# Patient Record
Sex: Male | Born: 1935 | Race: White | Hispanic: No | Marital: Married | Smoking: Never smoker
Health system: Southern US, Community
[De-identification: ages and names within clinical notes are randomized; demographics above are authoritative.]

## PROBLEM LIST (undated history)

## (undated) DIAGNOSIS — Z8551 Personal history of malignant neoplasm of bladder: Secondary | ICD-10-CM

## (undated) DIAGNOSIS — Z7901 Long term (current) use of anticoagulants: Secondary | ICD-10-CM

## (undated) DIAGNOSIS — E785 Hyperlipidemia, unspecified: Secondary | ICD-10-CM

## (undated) DIAGNOSIS — Z951 Presence of aortocoronary bypass graft: Secondary | ICD-10-CM

## (undated) DIAGNOSIS — I639 Cerebral infarction, unspecified: Secondary | ICD-10-CM

## (undated) DIAGNOSIS — Z953 Presence of xenogenic heart valve: Secondary | ICD-10-CM

## (undated) DIAGNOSIS — Z9889 Other specified postprocedural states: Secondary | ICD-10-CM

## (undated) DIAGNOSIS — R351 Nocturia: Secondary | ICD-10-CM

## (undated) DIAGNOSIS — Z86711 Personal history of pulmonary embolism: Secondary | ICD-10-CM

## (undated) DIAGNOSIS — R3915 Urgency of urination: Secondary | ICD-10-CM

## (undated) DIAGNOSIS — Z972 Presence of dental prosthetic device (complete) (partial): Secondary | ICD-10-CM

## (undated) DIAGNOSIS — Z8546 Personal history of malignant neoplasm of prostate: Secondary | ICD-10-CM

## (undated) DIAGNOSIS — D696 Thrombocytopenia, unspecified: Secondary | ICD-10-CM

## (undated) DIAGNOSIS — Z85828 Personal history of other malignant neoplasm of skin: Secondary | ICD-10-CM

## (undated) DIAGNOSIS — I2510 Atherosclerotic heart disease of native coronary artery without angina pectoris: Secondary | ICD-10-CM

## (undated) DIAGNOSIS — I4892 Unspecified atrial flutter: Secondary | ICD-10-CM

## (undated) DIAGNOSIS — D494 Neoplasm of unspecified behavior of bladder: Secondary | ICD-10-CM

## (undated) DIAGNOSIS — Z974 Presence of external hearing-aid: Secondary | ICD-10-CM

## (undated) DIAGNOSIS — Z952 Presence of prosthetic heart valve: Secondary | ICD-10-CM

## (undated) DIAGNOSIS — Z86718 Personal history of other venous thrombosis and embolism: Secondary | ICD-10-CM

## (undated) DIAGNOSIS — R600 Localized edema: Secondary | ICD-10-CM

## (undated) DIAGNOSIS — R350 Frequency of micturition: Secondary | ICD-10-CM

## (undated) DIAGNOSIS — E049 Nontoxic goiter, unspecified: Secondary | ICD-10-CM

## (undated) DIAGNOSIS — Z85820 Personal history of malignant melanoma of skin: Secondary | ICD-10-CM

## (undated) HISTORY — PX: CARDIAC CATHETERIZATION: SHX172

## (undated) HISTORY — PX: CORONARY ARTERY BYPASS GRAFT: SHX141

## (undated) HISTORY — DX: Hyperlipidemia, unspecified: E78.5

## (undated) HISTORY — PX: AORTIC VALVE REPLACEMENT (AVR)/CORONARY ARTERY BYPASS GRAFTING (CABG): SHX5725

## (undated) HISTORY — DX: Personal history of other venous thrombosis and embolism: Z86.718

## (undated) HISTORY — DX: Cerebral infarction, unspecified: I63.9

## (undated) HISTORY — PX: PROSTATECTOMY: SHX69

## (undated) HISTORY — PX: OTHER SURGICAL HISTORY: SHX169

## (undated) HISTORY — DX: Other specified postprocedural states: Z98.890

## (undated) HISTORY — DX: Atherosclerotic heart disease of native coronary artery without angina pectoris: I25.10

## (undated) HISTORY — DX: Long term (current) use of anticoagulants: Z79.01

## (undated) HISTORY — DX: Thrombocytopenia, unspecified: D69.6

## (undated) HISTORY — PX: CATARACT EXTRACTION W/ INTRAOCULAR LENS  IMPLANT, BILATERAL: SHX1307

## (undated) HISTORY — PX: CARDIOVASCULAR STRESS TEST: SHX262

## (undated) HISTORY — PX: TRANSTHORACIC ECHOCARDIOGRAM: SHX275

---
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
## 1997-12-15 ENCOUNTER — Other Ambulatory Visit: Admission: RE | Admit: 1997-12-15 | Discharge: 1997-12-15 | Payer: Self-pay | Admitting: Urology

## 1998-05-10 ENCOUNTER — Encounter: Admission: RE | Admit: 1998-05-10 | Discharge: 1998-08-08 | Payer: Self-pay | Admitting: Radiation Oncology

## 1998-09-07 ENCOUNTER — Ambulatory Visit (HOSPITAL_BASED_OUTPATIENT_CLINIC_OR_DEPARTMENT_OTHER): Admission: RE | Admit: 1998-09-07 | Discharge: 1998-09-07 | Payer: Self-pay | Admitting: Orthopedic Surgery

## 1998-09-19 HISTORY — PX: ROTATOR CUFF REPAIR: SHX139

## 1998-11-11 ENCOUNTER — Encounter: Payer: Self-pay | Admitting: Orthopedic Surgery

## 1998-11-11 ENCOUNTER — Inpatient Hospital Stay (HOSPITAL_COMMUNITY): Admission: AD | Admit: 1998-11-11 | Discharge: 1998-11-15 | Payer: Self-pay | Admitting: Internal Medicine

## 1998-11-15 ENCOUNTER — Ambulatory Visit (HOSPITAL_COMMUNITY): Admission: RE | Admit: 1998-11-15 | Discharge: 1998-11-15 | Payer: Self-pay | Admitting: Internal Medicine

## 2000-01-18 ENCOUNTER — Encounter: Payer: Self-pay | Admitting: Emergency Medicine

## 2000-01-18 ENCOUNTER — Emergency Department (HOSPITAL_COMMUNITY): Admission: EM | Admit: 2000-01-18 | Discharge: 2000-01-19 | Payer: Self-pay | Admitting: Emergency Medicine

## 2000-01-18 ENCOUNTER — Encounter: Payer: Self-pay | Admitting: Urology

## 2000-01-19 ENCOUNTER — Encounter: Payer: Self-pay | Admitting: Urology

## 2002-12-28 ENCOUNTER — Encounter: Admission: RE | Admit: 2002-12-28 | Discharge: 2002-12-28 | Payer: Self-pay | Admitting: Internal Medicine

## 2002-12-28 ENCOUNTER — Encounter: Payer: Self-pay | Admitting: Internal Medicine

## 2003-05-29 ENCOUNTER — Inpatient Hospital Stay (HOSPITAL_COMMUNITY): Admission: EM | Admit: 2003-05-29 | Discharge: 2003-06-03 | Payer: Self-pay | Admitting: Emergency Medicine

## 2003-05-29 IMAGING — CT CT EXTREM LOW BILAT W/ CM
1 of 2 series · 8 of 14 positions shown, 10 images · IV contrast (150 ML OMNI 300)
Comparison: none

CLINICAL DATA: Chest pain, shortness of breath.
 CT CHEST WITH CONTRAST, CT LOWER EXTREMITIES BILATERAL LIMITED, [DATE]
 Multidetector helical CT images performed through the chest following 150 cc Omnipaque 300 IV per pulmonary embolus protocol.  Noncontiguous images were then performed through the lower extremities to assess for DVT.
 CT CHEST 
 There are extensive bilateral pulmonary emboli, most pronounced in the posterobasal right lower lobe branches, left upper lobe branches, and left lower lobe branches.  Dependent atelectasis and mild bibasilar atelectasis noted.  No effusions.  Heart is within limits of normal in size.  No mediastinal, hilar, or axillary adenopathy.
 IMPRESSION 
 Bilateral pulmonary emboli.  
 CT LOWER EXTREMITIES LIMITED, BILATERAL 
 No filling defects are seen in the deep venous structures of the pelvis or lower extremities to suggest DVT.  Limited images through the pelvis unremarkable.
 No evidence of lower extremity DVT.
 [REDACTED]

[Series 2: *don't forget:recon (date) offon · axial · 0.72mm/px · z∈[-511,-84]mm · 8 of 187 slices shown, 10 images]
[im 21/187  soft-tissue]
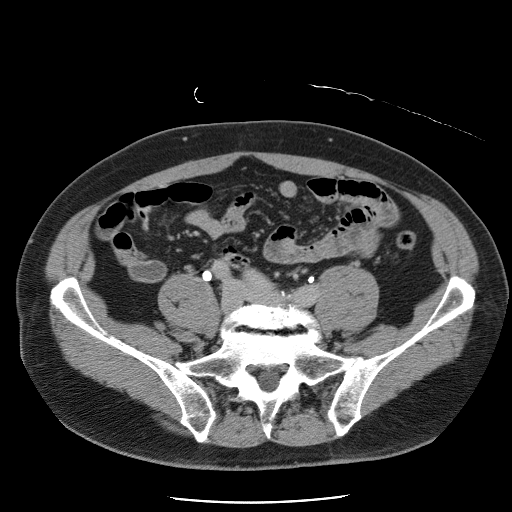
[im 21/187  bone]
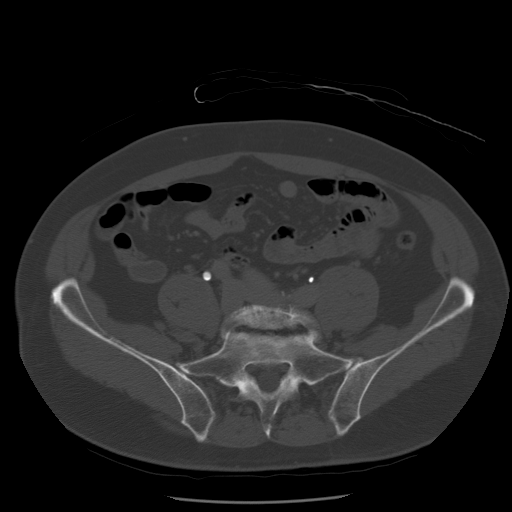
[im 42/187  bone]
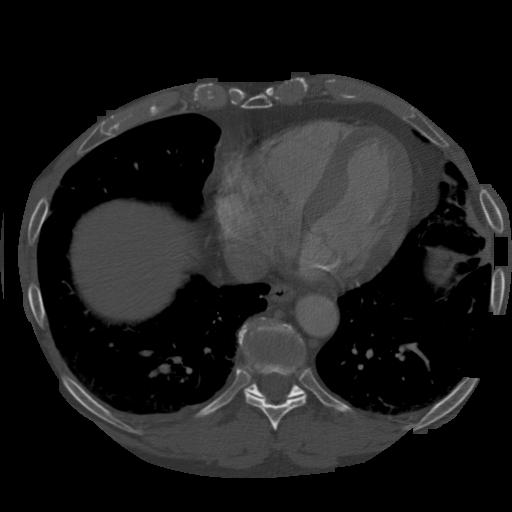
[im 63/187  bone]
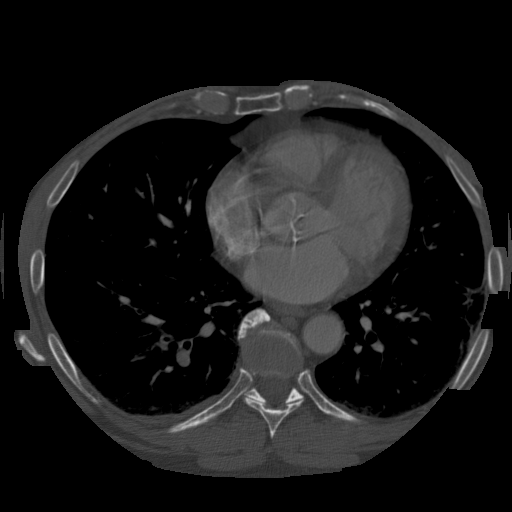
[im 83/187  bone]
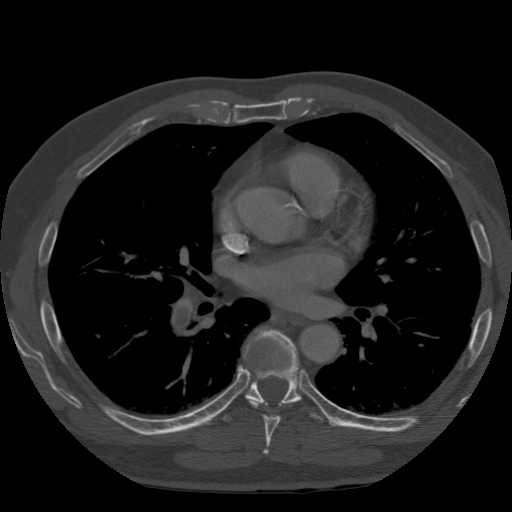
[im 104/187  soft-tissue]
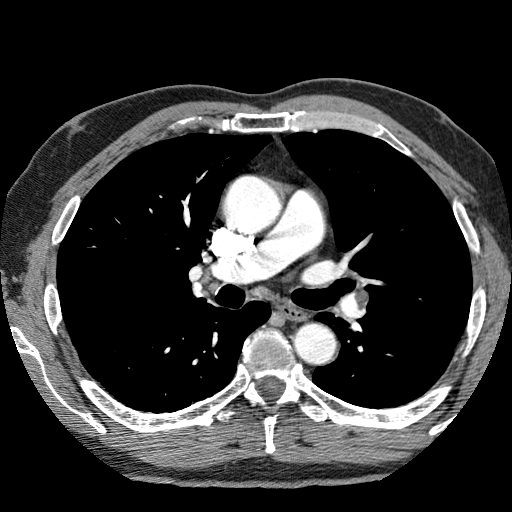
[im 104/187  bone]
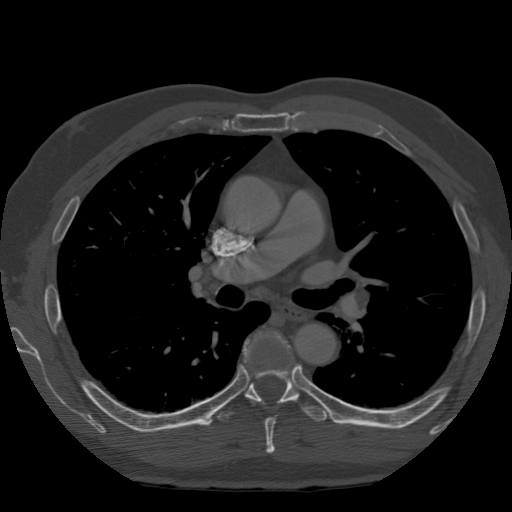
[im 125/187  bone]
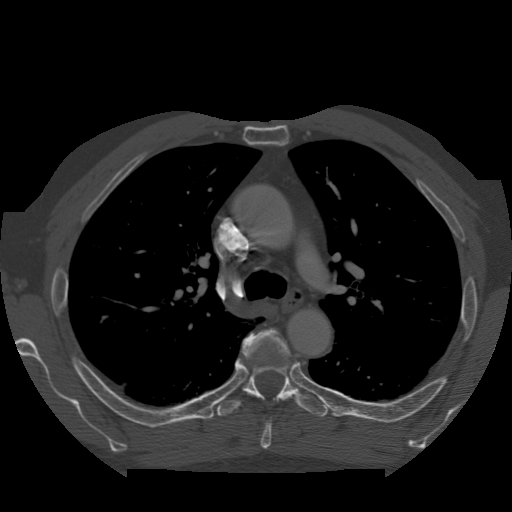
[im 145/187  bone]
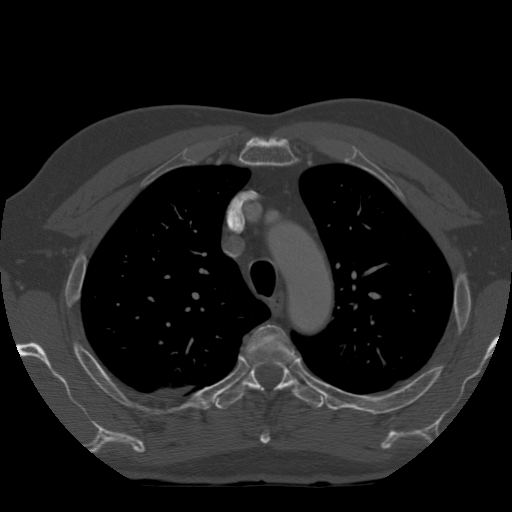
[im 166/187  bone]
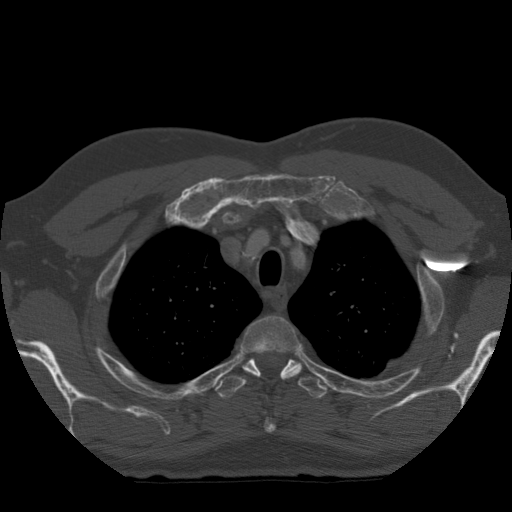

[8 of 14 positions shown; findings below may reference images not displayed]

## 2006-11-11 ENCOUNTER — Encounter: Admission: RE | Admit: 2006-11-11 | Discharge: 2006-11-11 | Payer: Self-pay | Admitting: Internal Medicine

## 2006-11-11 IMAGING — CT CT ANGIO CHEST
2 of 5 series · 19 of 36 positions shown · IV contrast ([ID] OMNI 300)
Comparison: [DATE]

CT ANGIOGRAPHY OF CHEST - PULMONARY EMBOLISM PROTOCOL:

Addendum Begins
I discussed the results of this exam with the patient and his wife immediately
after the exam. Mr. MIMS stated that he had been given a prescription for
pneumonia seen on an office chest x-ray earlier today. He is planning to have
the prescription filled this evening.
Addendum Ends
CLINICAL DATA: Increasing shortness of breath with cough. Left chest pain.
History of pulmonary embolus
TECHNIQUE: Multidetector CT imaging of the chest was performed according to the
protocol for detection of pulmonary embolism during bolus injection of
intravenous contrast.  Coronal and sagittal plane CT angiographic image
reconstructions were also generated.

Contrast:  100 cc Omnipaque 300

[Series 4: pe · axial · 0.64mm/px · z∈[-384,-59]mm · 18 of 142 slices shown]
[im 6/142  lung]
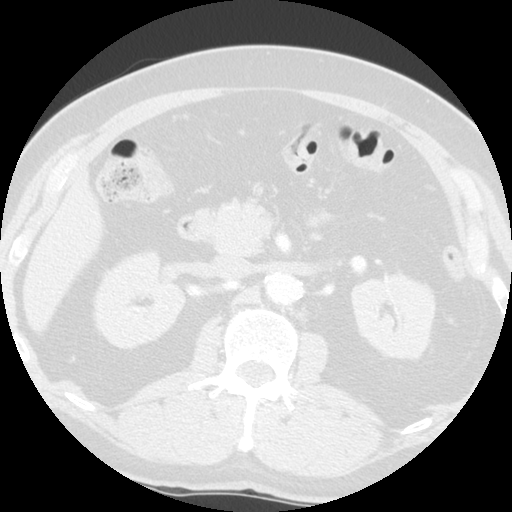
[im 16/142  mediastinal]
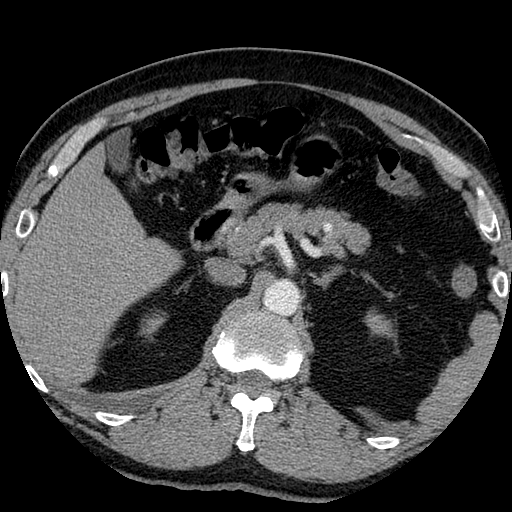
[im 21/142  lung]
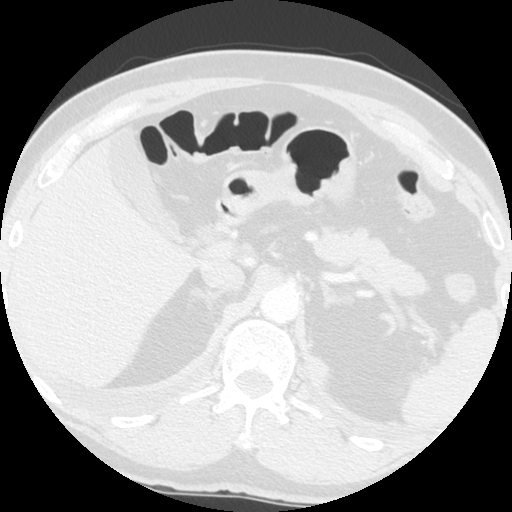
[im 32/142  mediastinal]
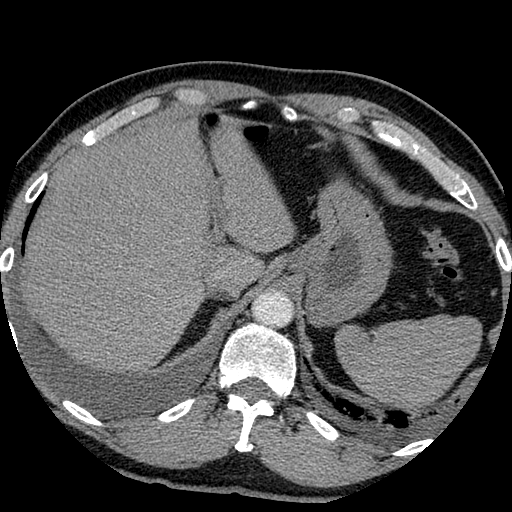
[im 37/142  lung]
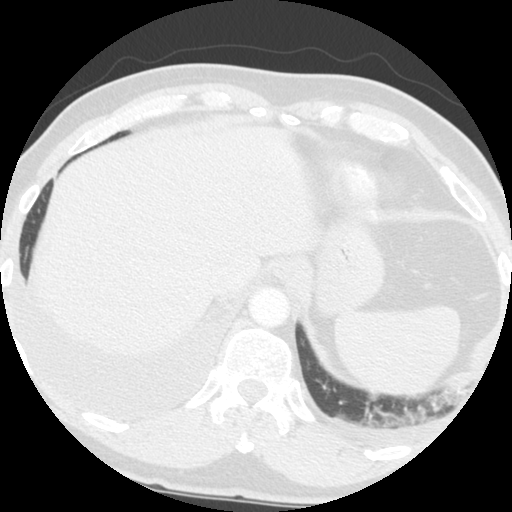
[im 42/142  mediastinal]
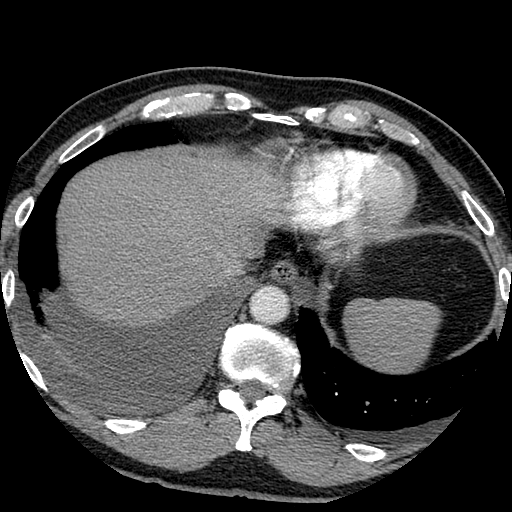
[im 53/142  lung]
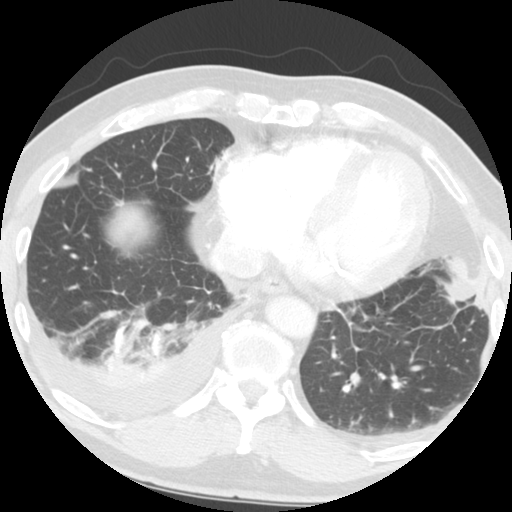
[im 58/142  mediastinal]
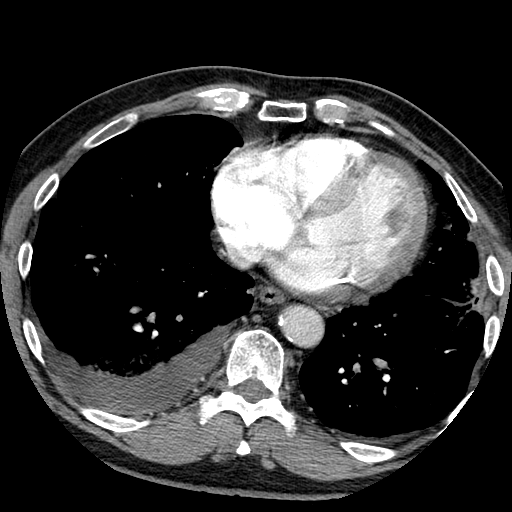
[im 68/142  lung]
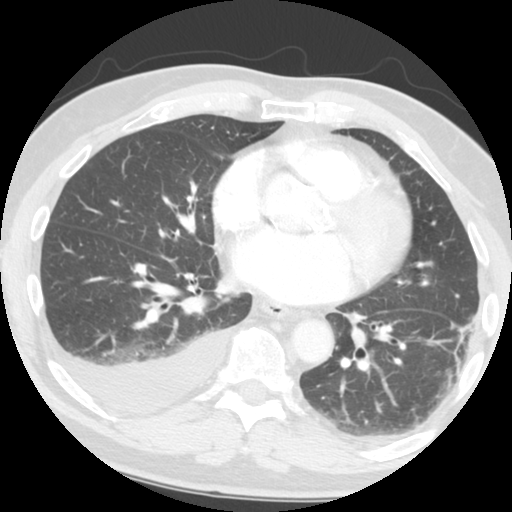
[im 74/142  mediastinal]
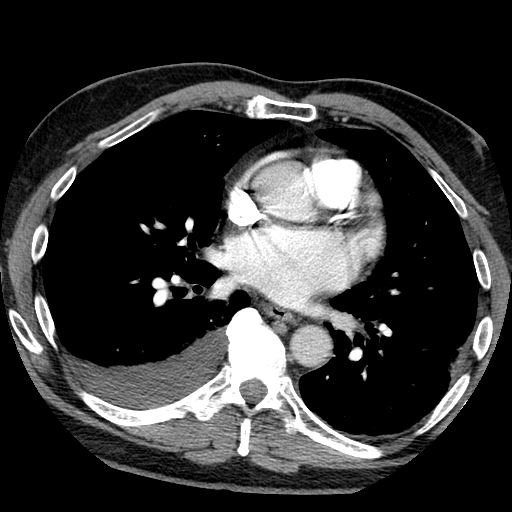
[im 84/142  lung]
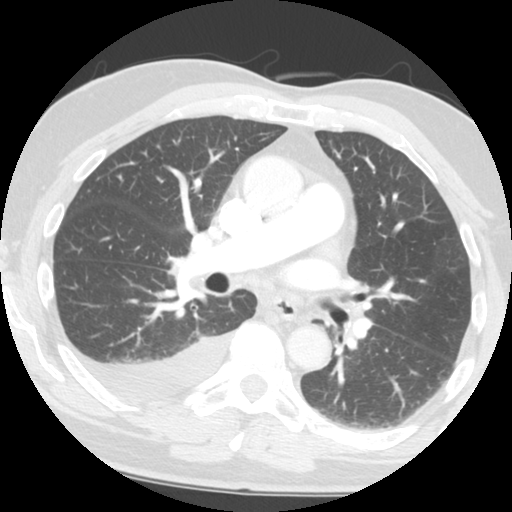
[im 89/142  mediastinal]
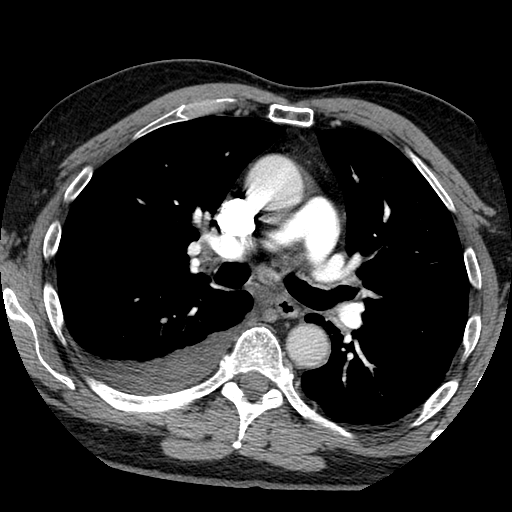
[im 100/142  lung]
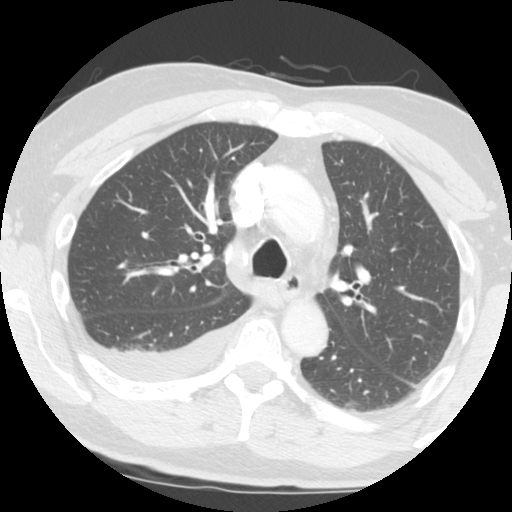
[im 105/142  mediastinal]
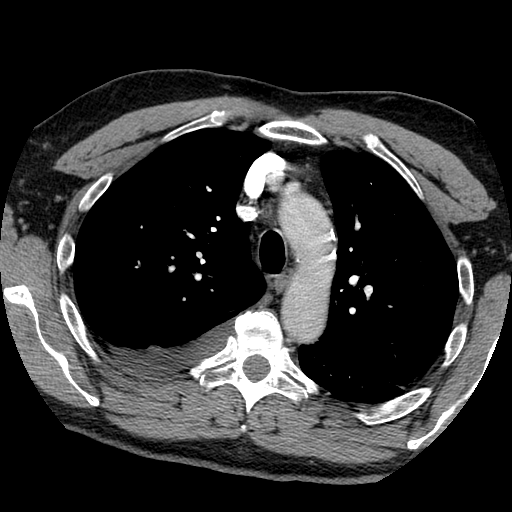
[im 110/142  lung]
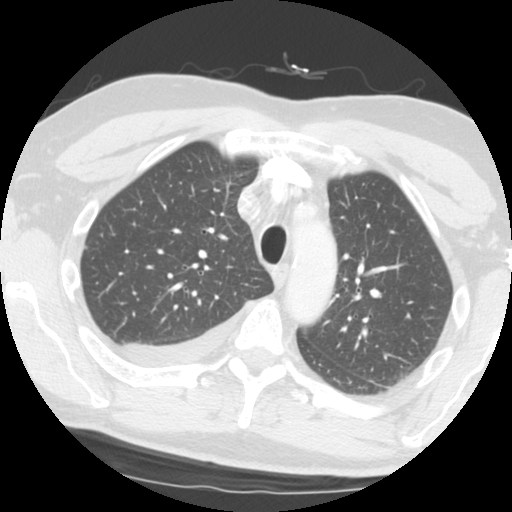
[im 121/142  mediastinal]
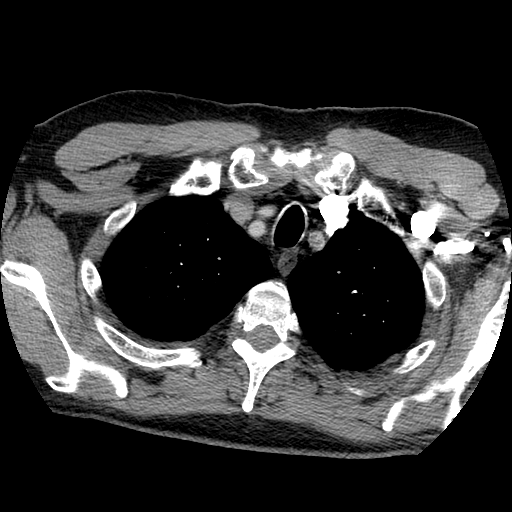
[im 126/142  lung]
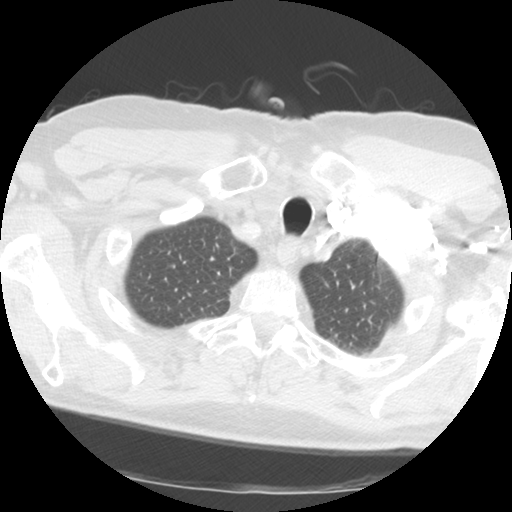
[im 136/142  mediastinal]
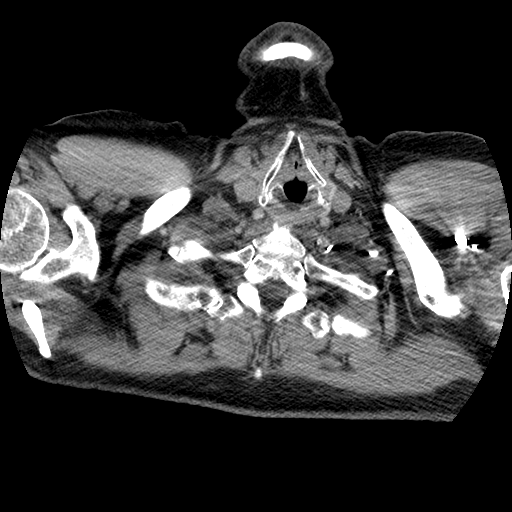

[Series 602: sagittal angio · sagittal · 0.69mm/px · 1 of 133 slices shown]
[im 67/133  mediastinal]
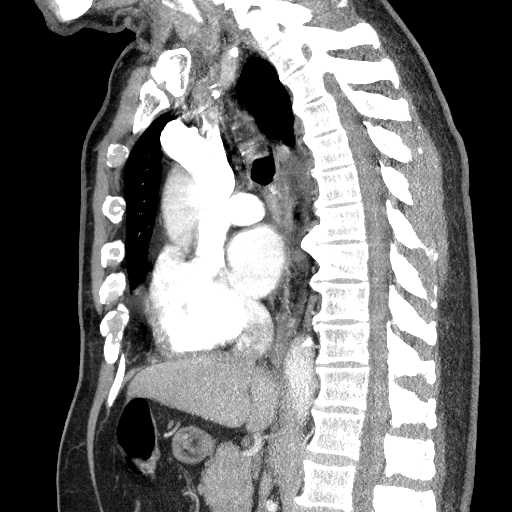

[19 of 36 positions shown; findings below may reference images not displayed]

FINDINGS: An there are no filling defects in the opacified pulmonary arteries to
suggest the presence of an acute or chronic pulmonary embolus. The pulmonary
embolic disease seen on the previous exam has resolved.

There is no axillary or mediastinal lymphadenopathy. A subcarinal lymph node and
lymphoid tissue in the right hilum is stable. Heart size is normal without
pericardial effusion. Aortic valve, mitral valve, and coronary artery
calcification is evident. There are small bilateral pleural effusions, right
slightly more than left.

Mild collapse/consolidation is seen in the posterior right lower lobe. Tiny
subpleural nodule on the right (image 46) is stable in the interval. A small
focus of soft tissue attenuation in the lingula, involving the base of the major
fissure, is stable in the interval, suggesting scarring. Dependent atelectasis
is seen in the posterior left lung base.
IMPRESSION: No CT evidence for acute pulmonary embolus.

Small bilateral pleural effusions.

Small focus of collapse / consolidation in the posterolateral lobe.

## 2007-05-12 ENCOUNTER — Ambulatory Visit (HOSPITAL_COMMUNITY): Admission: RE | Admit: 2007-05-12 | Discharge: 2007-05-12 | Payer: Self-pay | Admitting: Cardiology

## 2007-05-29 ENCOUNTER — Encounter (INDEPENDENT_AMBULATORY_CARE_PROVIDER_SITE_OTHER): Payer: Self-pay | Admitting: Cardiology

## 2007-05-29 ENCOUNTER — Ambulatory Visit (HOSPITAL_COMMUNITY): Admission: RE | Admit: 2007-05-29 | Discharge: 2007-05-29 | Payer: Self-pay | Admitting: Cardiology

## 2007-06-12 ENCOUNTER — Ambulatory Visit: Payer: Self-pay | Admitting: Surgery

## 2007-06-17 ENCOUNTER — Ambulatory Visit (HOSPITAL_COMMUNITY): Admission: RE | Admit: 2007-06-17 | Discharge: 2007-06-17 | Payer: Self-pay | Admitting: Surgery

## 2007-06-17 ENCOUNTER — Encounter: Payer: Self-pay | Admitting: Surgery

## 2007-06-17 IMAGING — CR DG CHEST 2V
2 series · 2 of 2 positions shown · non-contrast
Comparison: CTA chest [DATE].

CLINICAL DATA: 77-year-old male preop study for aortic and mitral valve replacement.  
 CHEST - 2 VIEW:

[view not recorded (1 of 2)]
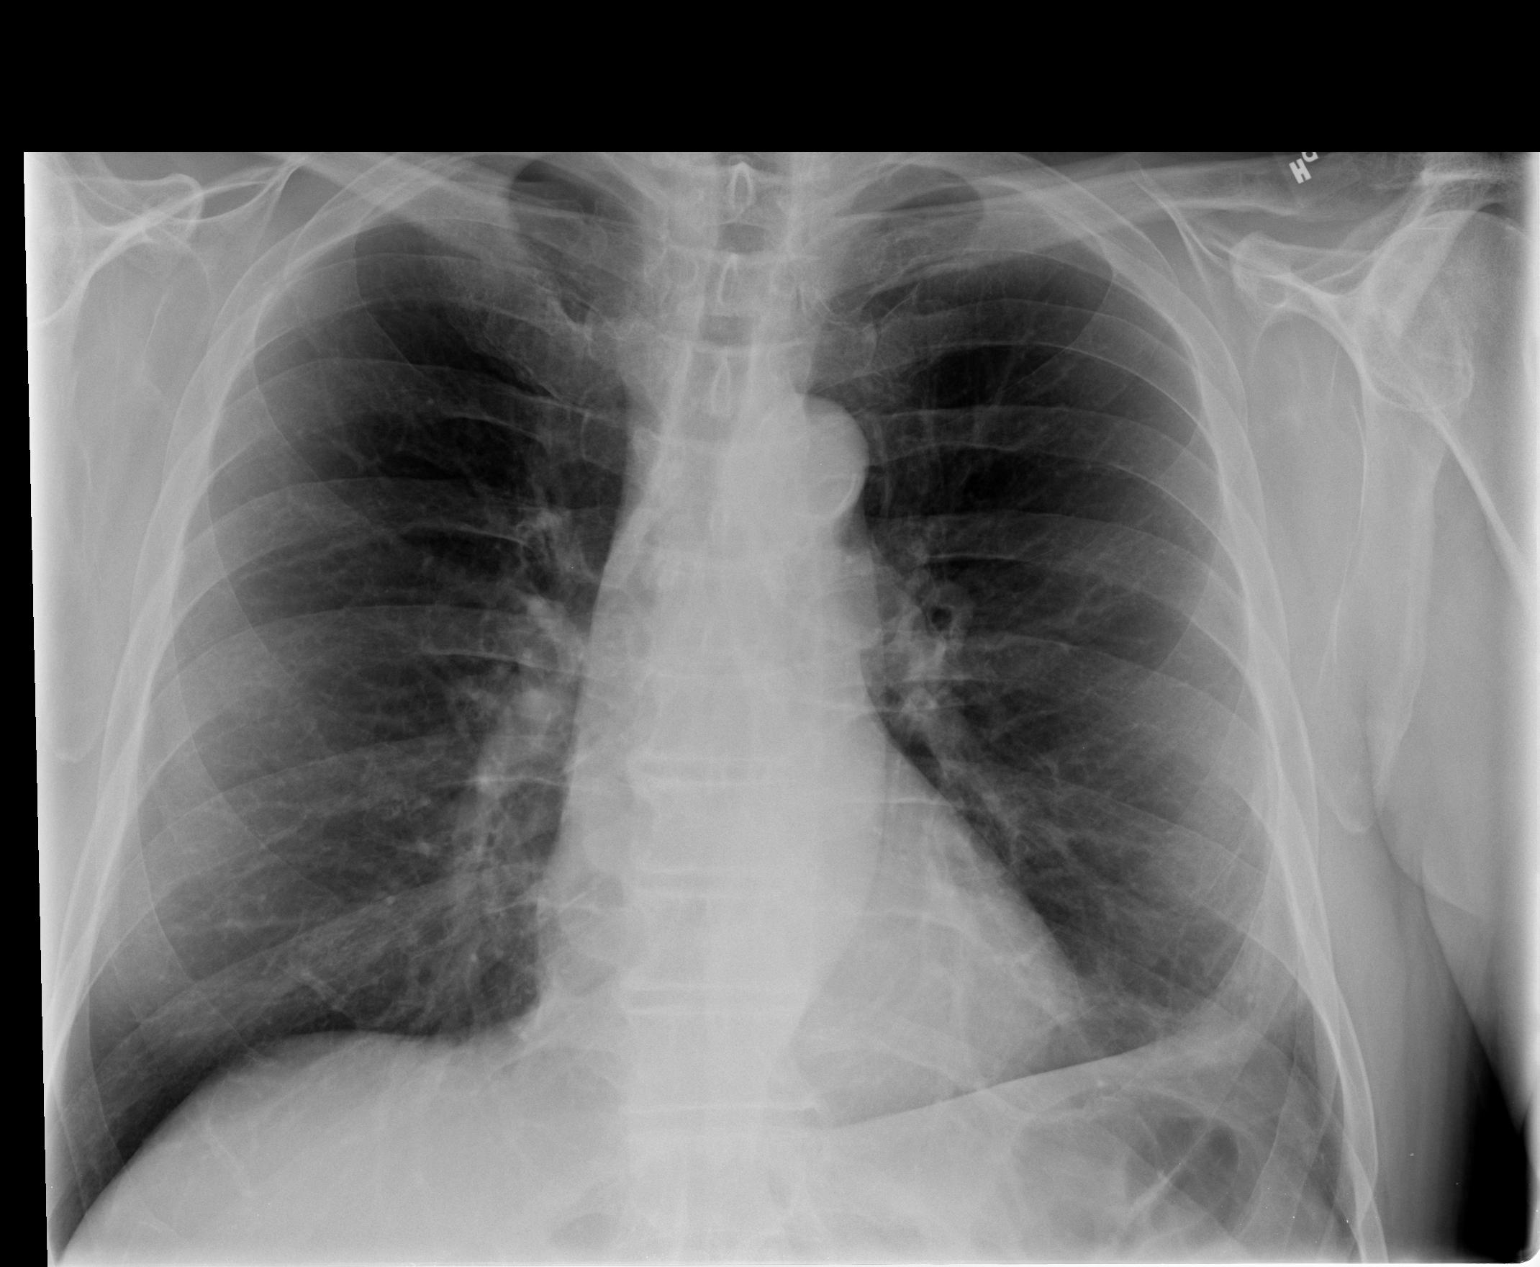

[view not recorded (2 of 2)]
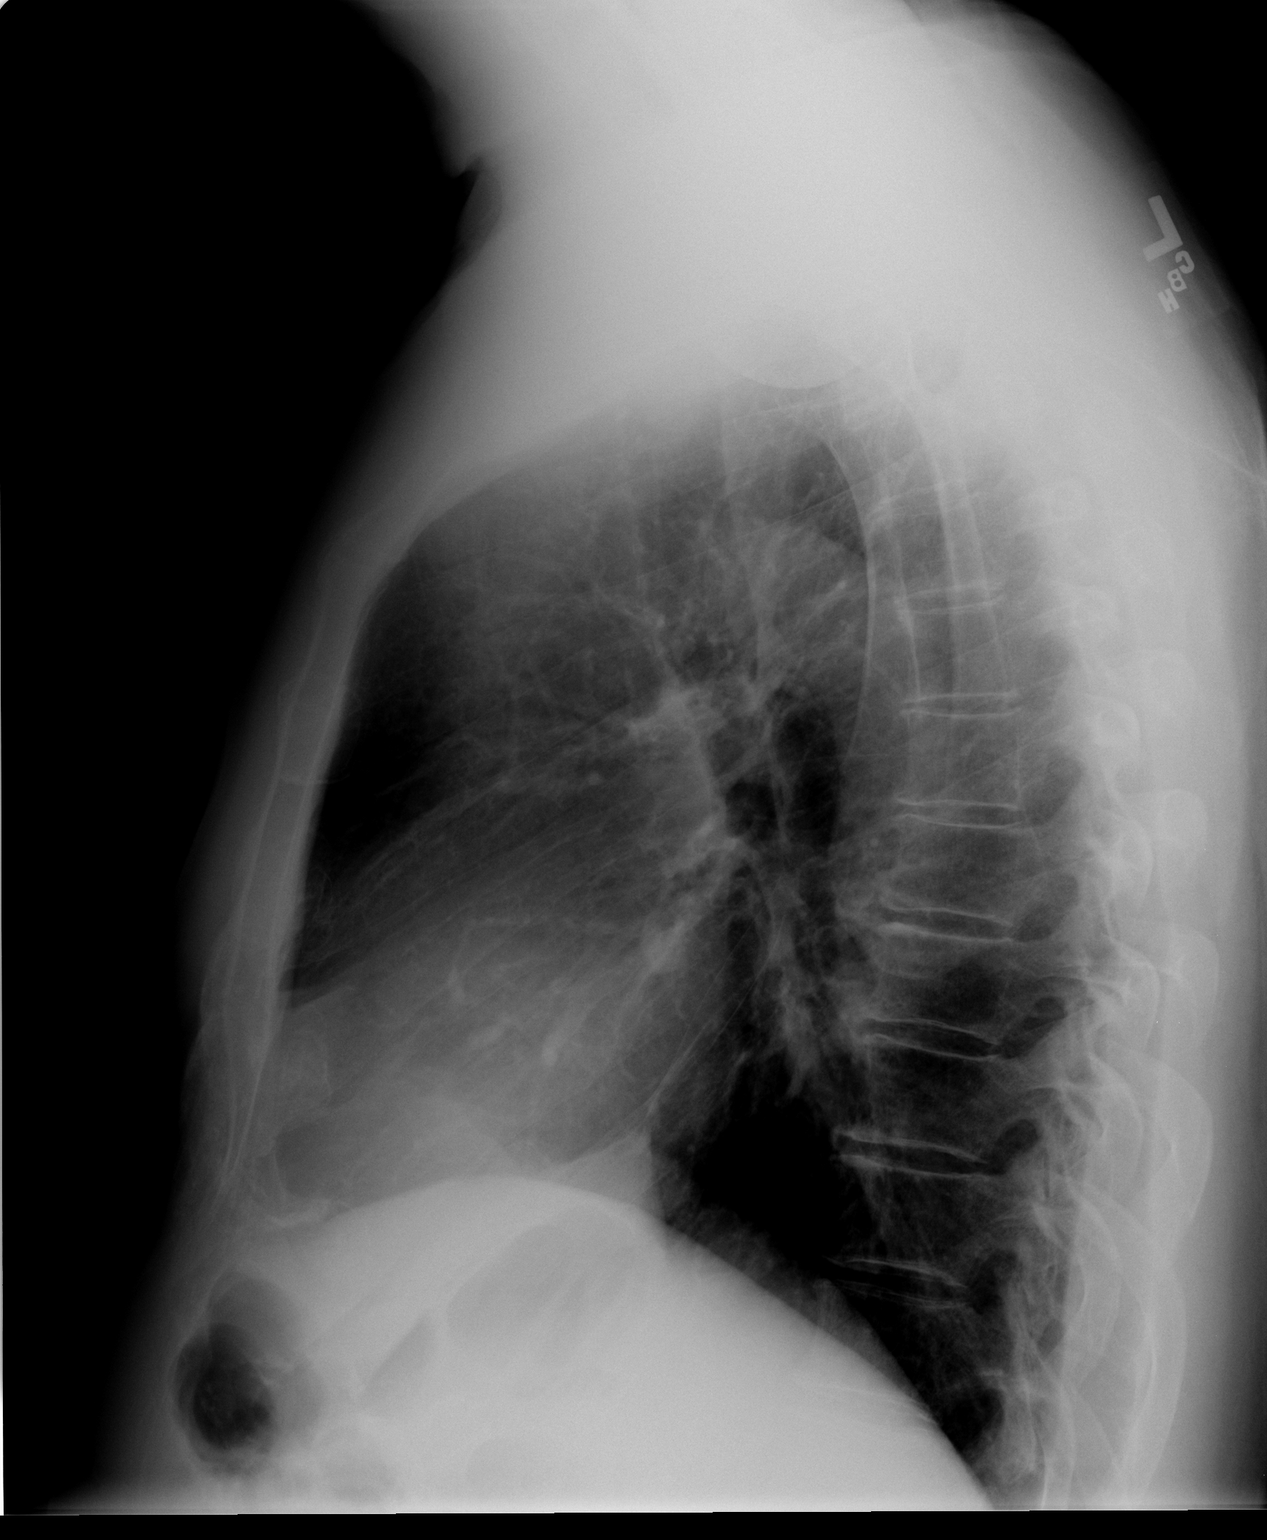

[2 of 2 positions shown; findings below may reference images not displayed]

FINDINGS: Chronic left lateral pleural scarring is noted with blunting of the left lateral costophrenic angle, unchanged from prior CT.  The posterior costophrenic angles are not entirely included.  There is no pleural effusion identified.  Cardiac size and mediastinal contour are normal aside from tortuous descending thoracic aorta.  No pneumothorax.  No pulmonary edema or consolidation.  No acute osseous abnormality is seen.
IMPRESSION: No acute cardiopulmonary abnormality.  Chronic left lateral costophrenic angle scarring.

## 2007-06-19 ENCOUNTER — Inpatient Hospital Stay (HOSPITAL_COMMUNITY): Admission: RE | Admit: 2007-06-19 | Discharge: 2007-06-24 | Payer: Self-pay | Admitting: Surgery

## 2007-06-19 ENCOUNTER — Ambulatory Visit: Payer: Self-pay | Admitting: Surgery

## 2007-06-19 ENCOUNTER — Encounter: Payer: Self-pay | Admitting: Surgery

## 2007-06-19 DIAGNOSIS — Z9889 Other specified postprocedural states: Secondary | ICD-10-CM

## 2007-06-19 DIAGNOSIS — I2510 Atherosclerotic heart disease of native coronary artery without angina pectoris: Secondary | ICD-10-CM

## 2007-06-19 HISTORY — DX: Other specified postprocedural states: Z98.890

## 2007-06-19 HISTORY — DX: Atherosclerotic heart disease of native coronary artery without angina pectoris: I25.10

## 2007-06-19 IMAGING — CR DG CHEST 1V PORT
1 series · 1 of 1 positions shown · non-contrast
Comparison: [DATE]

CLINICAL DATA: Status post median sternotomy.

CHEST - 1 VIEW

[AP]
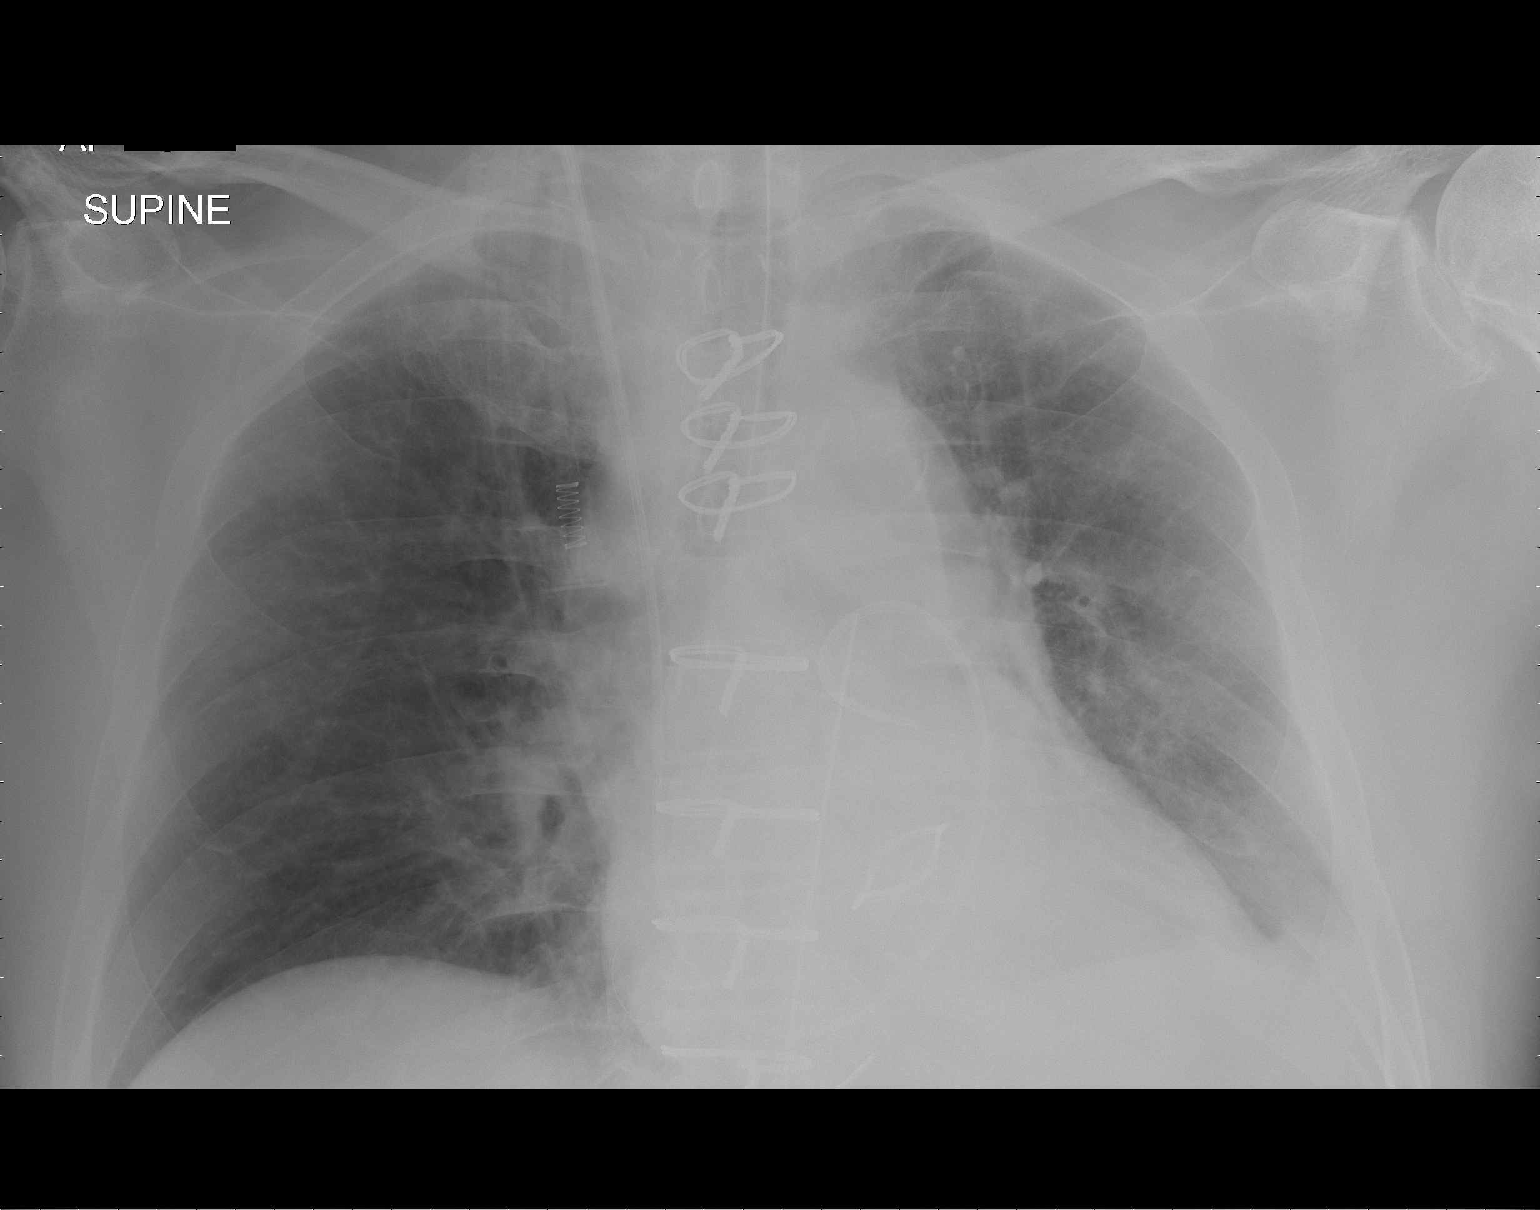

[1 of 1 positions shown; findings below may reference images not displayed]

FINDINGS: Endotracheal tube borderline low in position. Approximately 1.5 cm
above the carina. This could be retracted 2-3 cm for optimal positioning.

Median sternotomy. A right-sided IJ Swan-Ganz catheter is entering the left
pulmonary artery. Prior mitral valve repair. Left-sided chest tube and probable
mediastinal drains. No pneumothorax. Midline trachea. Mild superior mediastinal
soft tissue fullness is likely due to technique. Mild cardiomegaly. Small
left-sided pleural effusion. Mild left base atelectasis.

IMPRESSION

1. Endotracheal tube borderline low in position, 1.5 cm above carina. This
should be retracted 2-3 cm for optimal positioning.
2. Low lung volumes with small left pleural effusion and left base atelectasis.
3. No pneumothorax.

## 2007-06-20 IMAGING — CR DG CHEST 1V PORT
1 series · 1 of 1 positions shown · non-contrast
Comparison: [DATE].

CLINICAL DATA: Status post CABG.
 PORTABLE CHEST - 1 VIEW:

[view not recorded]
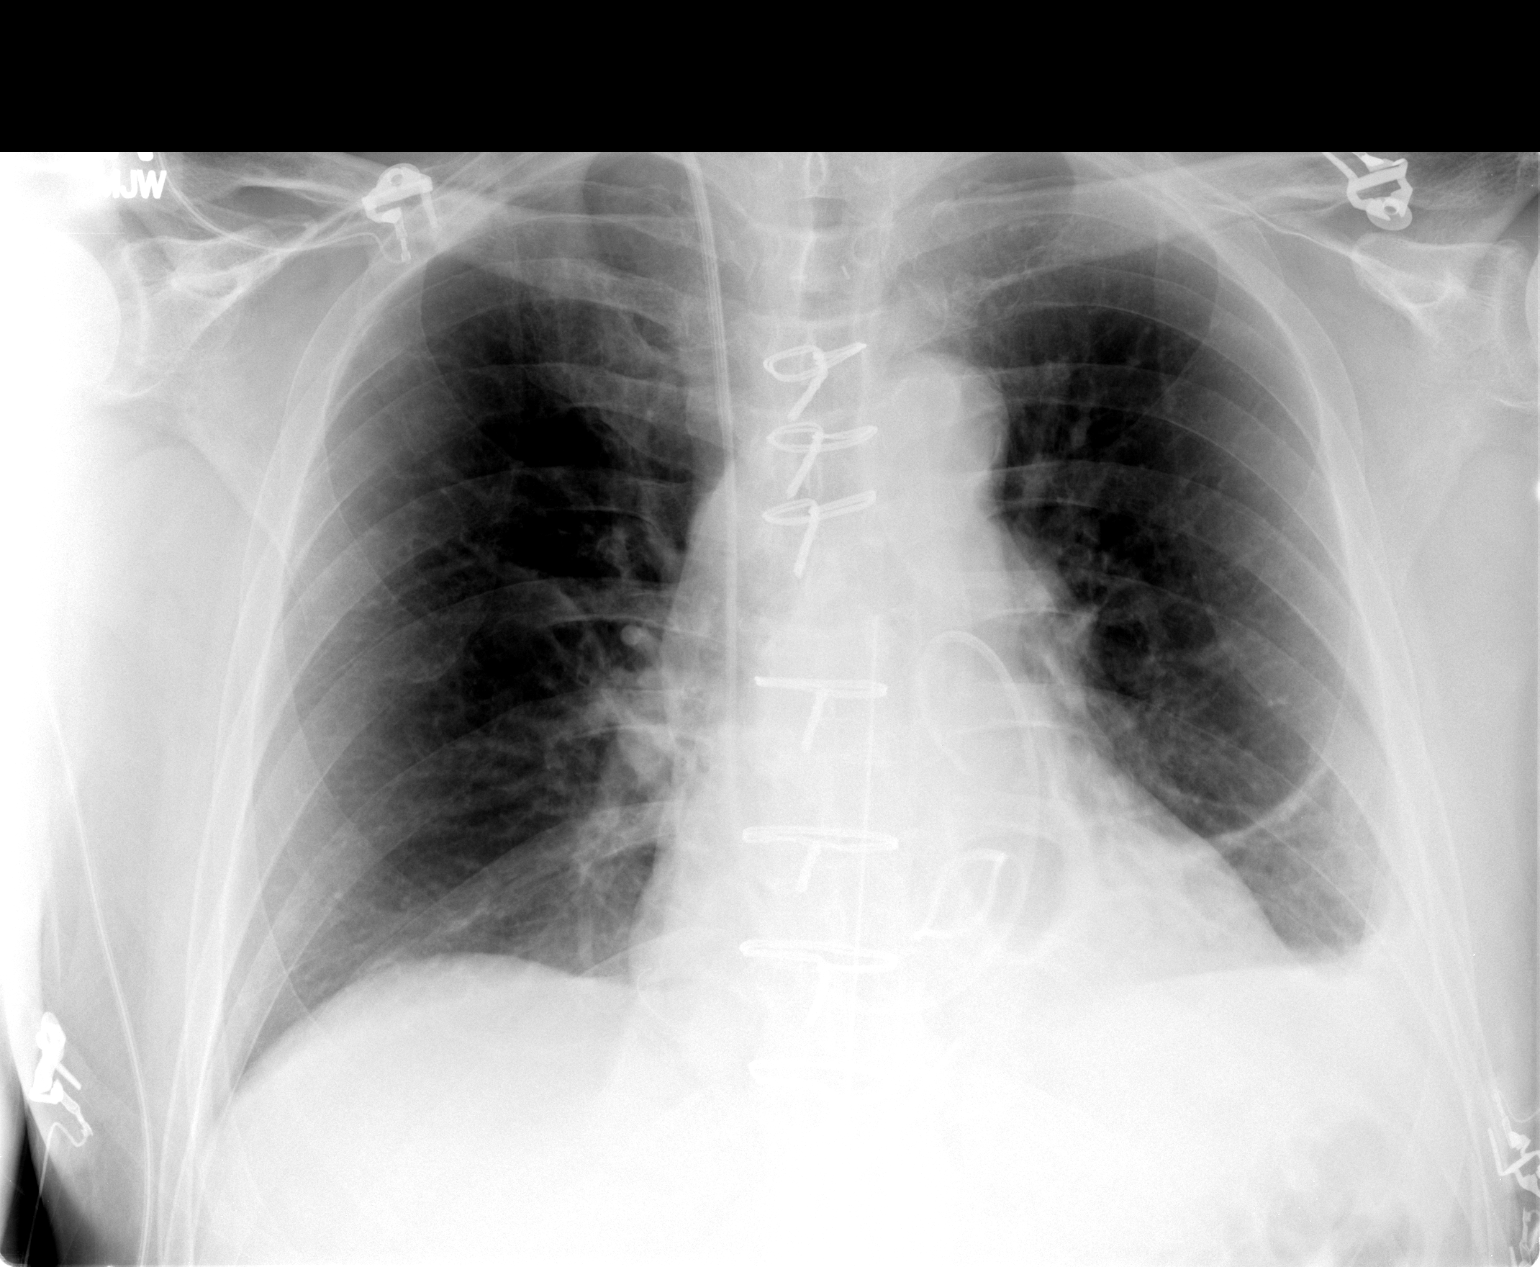

[1 of 1 positions shown; findings below may reference images not displayed]

FINDINGS: Endotracheal tube has been removed. Swan-Ganz catheter remains in place and is looped in the pulmonary outflow tract with the tip projecting inferiorly.  There is no pneumothorax. Small bilateral pleural effusions, left greater than right with basilar atelectasis.
IMPRESSION: 1.  Swan-Ganz catheter looped in the pulmonary outflow tract with the tip projecting inferiorly.
 2.  Small pleural effusions and basilar atelectasis, left greater than right.

## 2007-06-21 IMAGING — CR DG CHEST 1V PORT
1 series · 1 of 1 positions shown · non-contrast
Comparison: [DATE] and [DATE]

CLINICAL DATA: 77 year-old male with coronary artery disease status post median sternotomy.
PORTABLE CHEST- 1 VIEW:

[view not recorded]
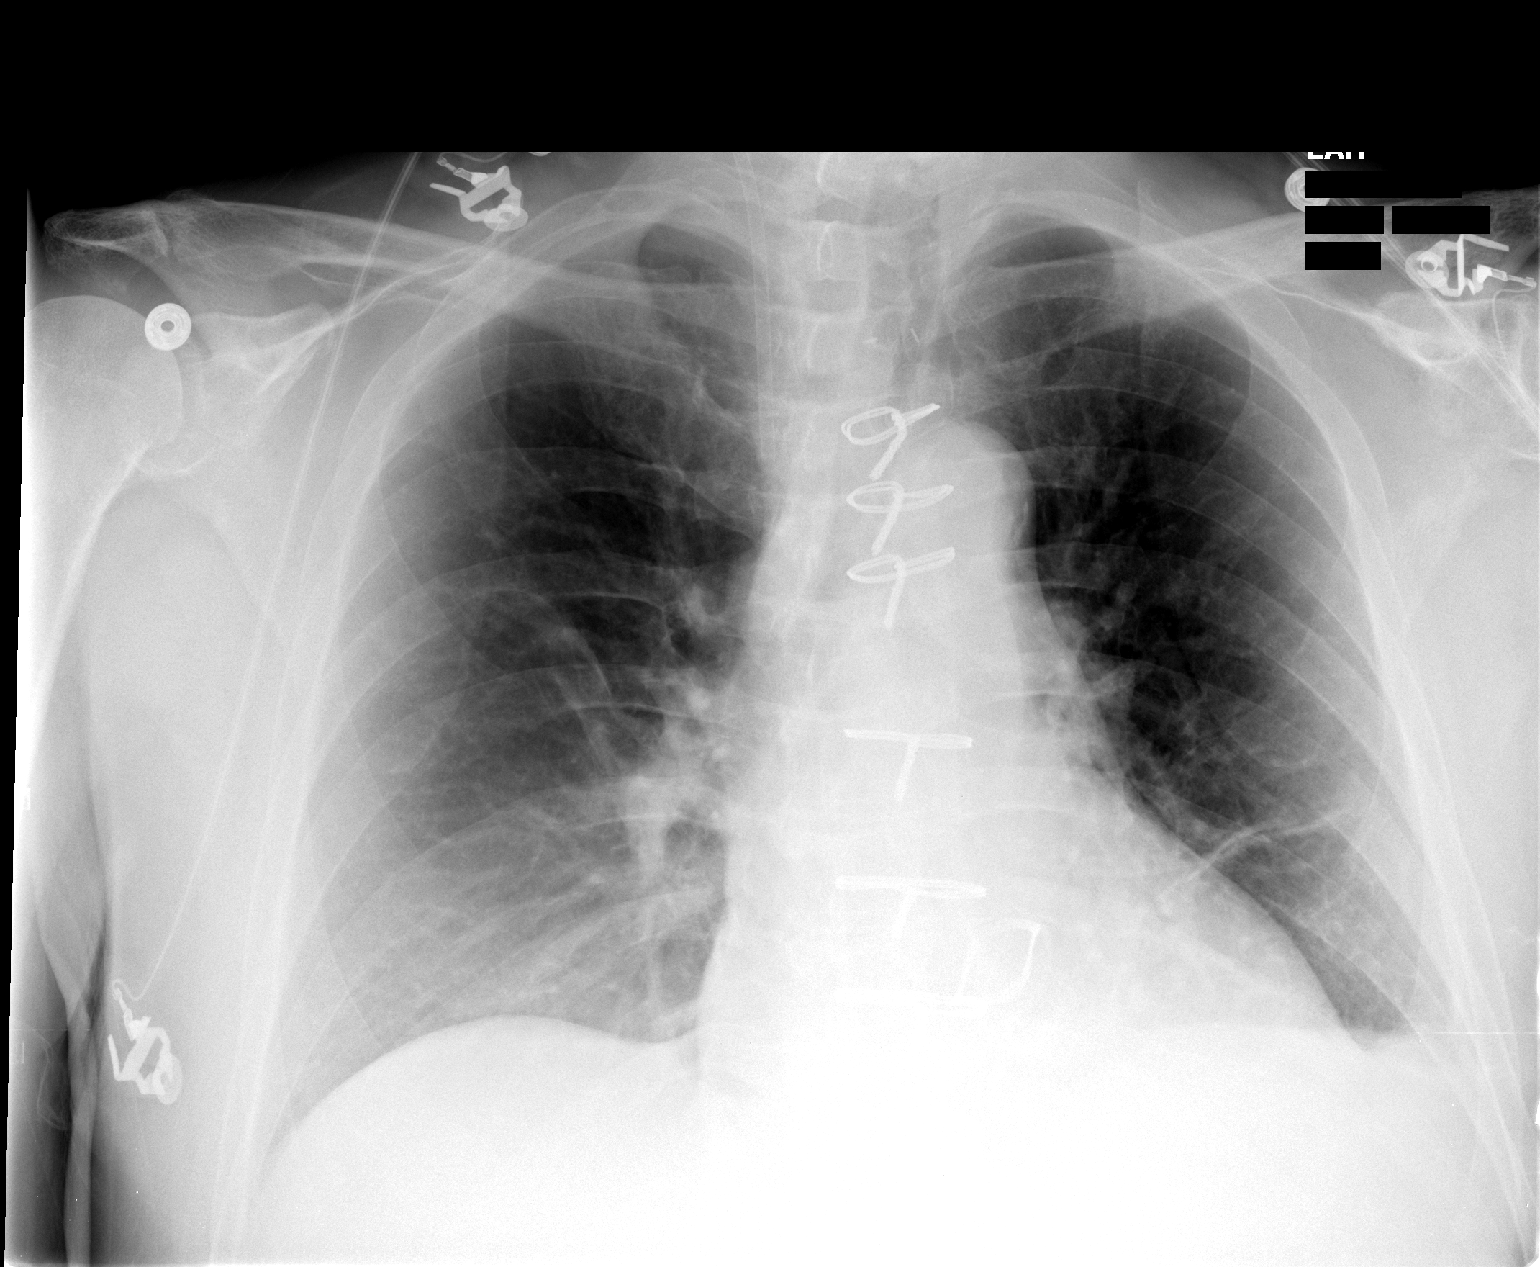

[1 of 1 positions shown; findings below may reference images not displayed]

FINDINGS: Mediastinal drain and Swan-Ganz catheter have both been removed.  Right IJ vascular sheath remains.  Patient is status post median sternotomy.  Prosthetic heart valve is noted.  There is residual hilar atelectasis with some improvement in the left lower lobe aeration.  Small left effusion is suspected.  No pneumothorax.
IMPRESSION: Improving atelectasis pattern.  Small residual left effusion.

## 2007-06-22 IMAGING — CR DG CHEST 2V
2 series · 2 of 2 positions shown · non-contrast
Comparison: none

CLINICAL DATA: AS, MR, CAD. CABG.
CHEST- 2 VIEWS - [DATE]: 
Comparison is made to yesterday?s exam.

[w chest pa]
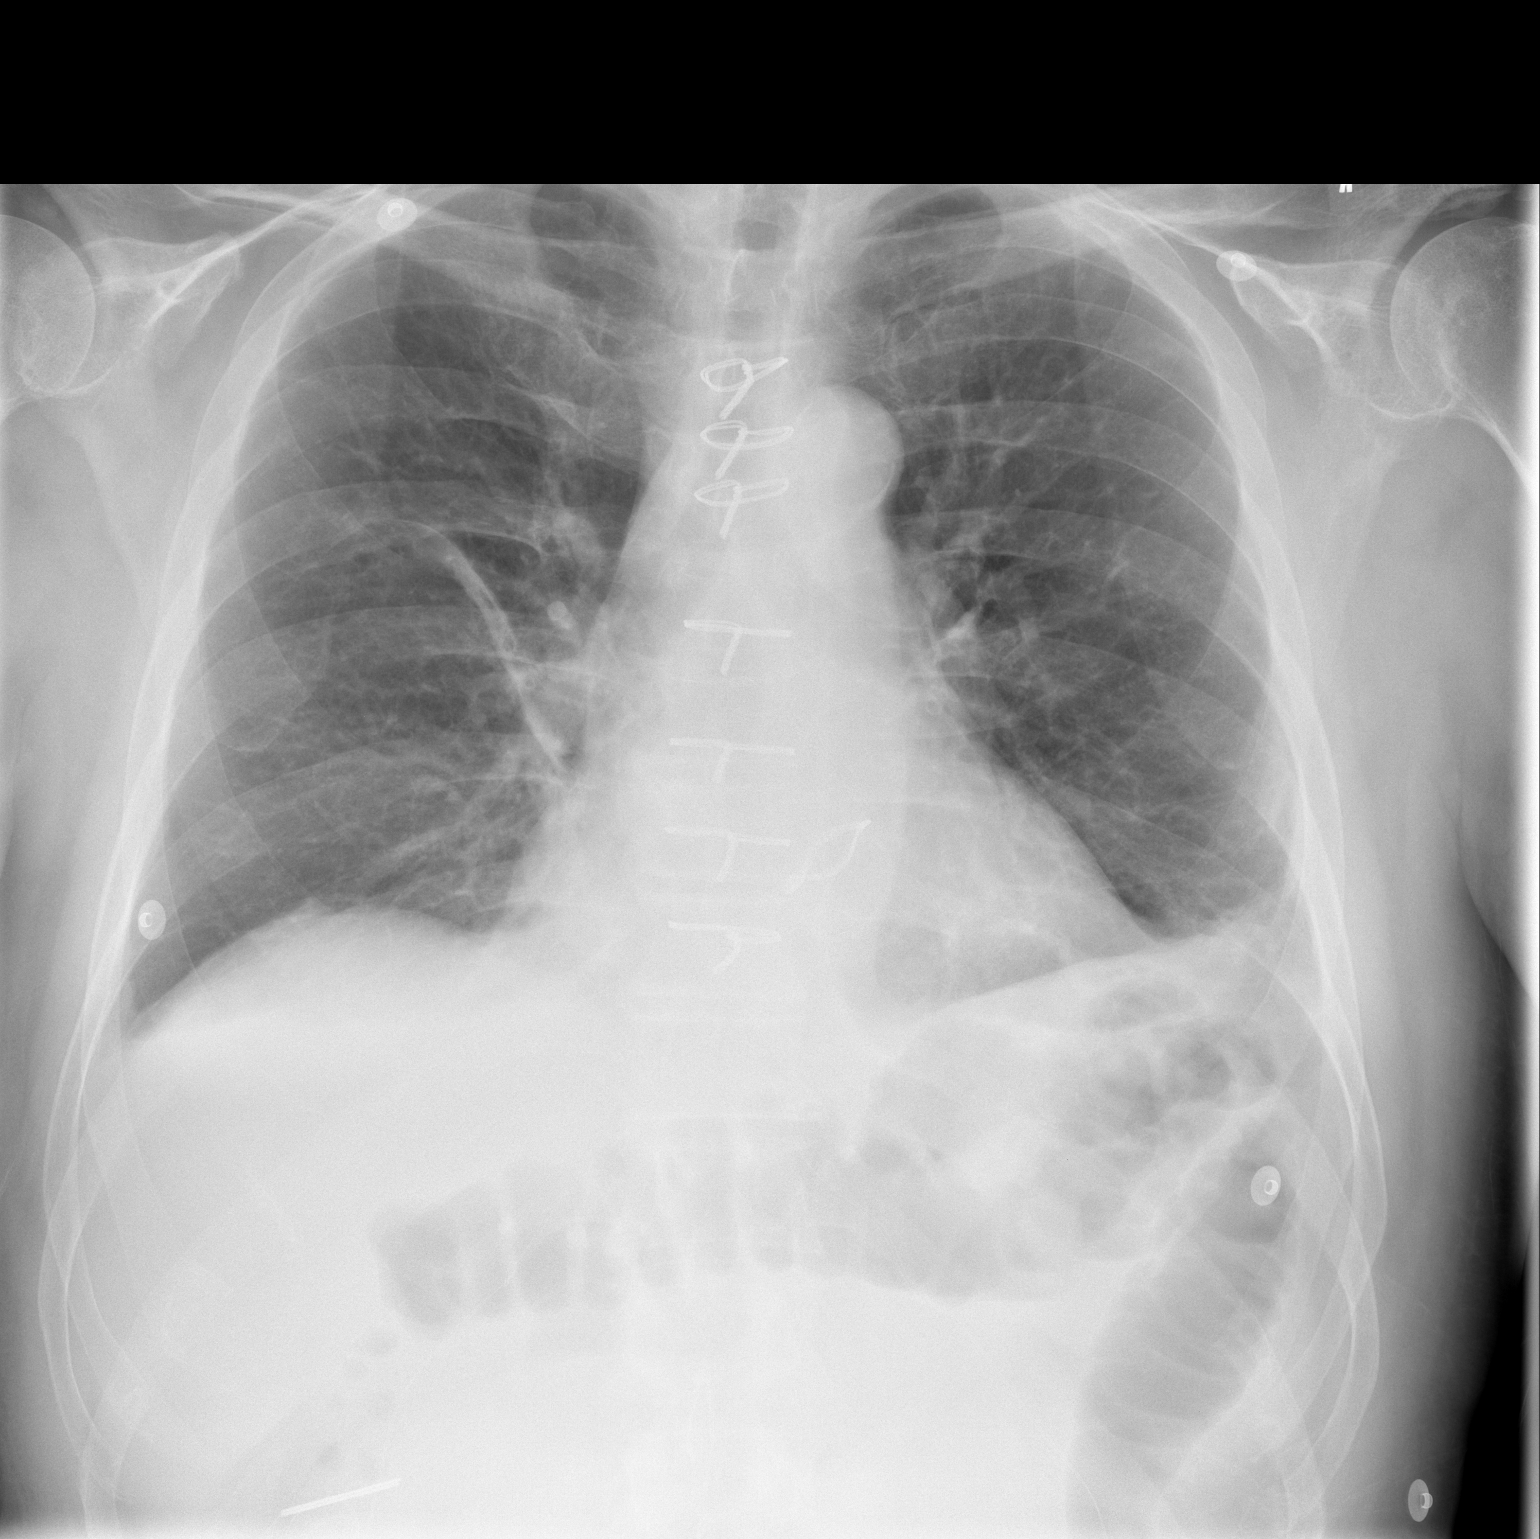

[w chest lat]
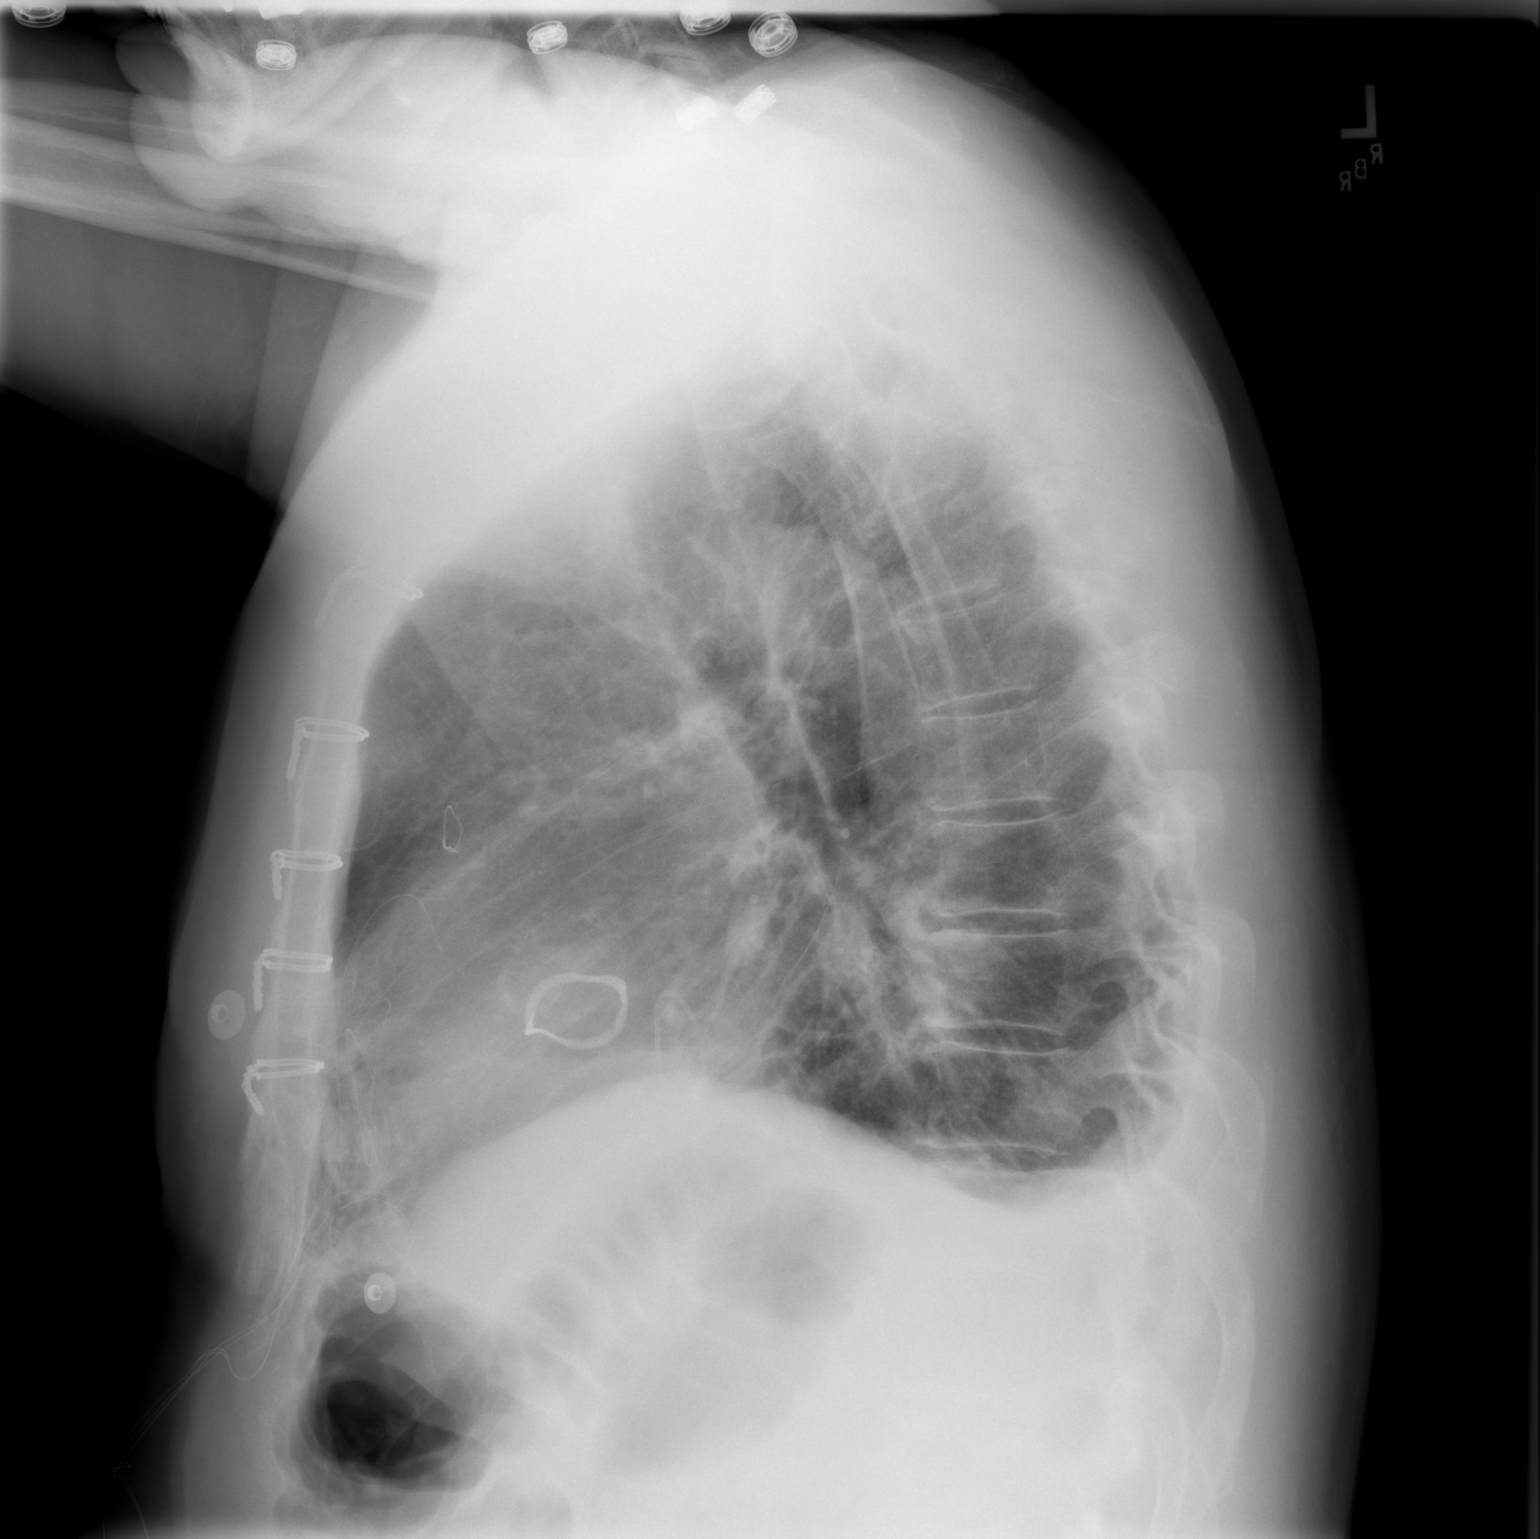

[2 of 2 positions shown; findings below may reference images not displayed]

FINDINGS: Resolved subsegmental atelectasis in the left lower lung zone.  Still present is subsegmental atelectasis in the right midlung zone.  Small pleural effusions.  No air space opacities.  No pneumothorax.  The Swan-Ganz catheter sheath has been removed.
IMPRESSION: No pneumothorax.  Right lower lung zone subsegmental atelectasis.  Small pleural effusions.

## 2007-06-23 ENCOUNTER — Ambulatory Visit: Payer: Self-pay | Admitting: Physical Medicine & Rehabilitation

## 2007-07-15 ENCOUNTER — Encounter: Admission: RE | Admit: 2007-07-15 | Discharge: 2007-07-15 | Payer: Self-pay | Admitting: Surgery

## 2007-07-15 ENCOUNTER — Ambulatory Visit: Payer: Self-pay | Admitting: Surgery

## 2007-07-15 IMAGING — CR DG CHEST 2V
2 series · 2 of 2 positions shown · non-contrast
Comparison: Chest, [DATE]

CLINICAL DATA: CABG a month ago.  Follow-up.
 CHEST - 2 VIEW:

[w chest pa]
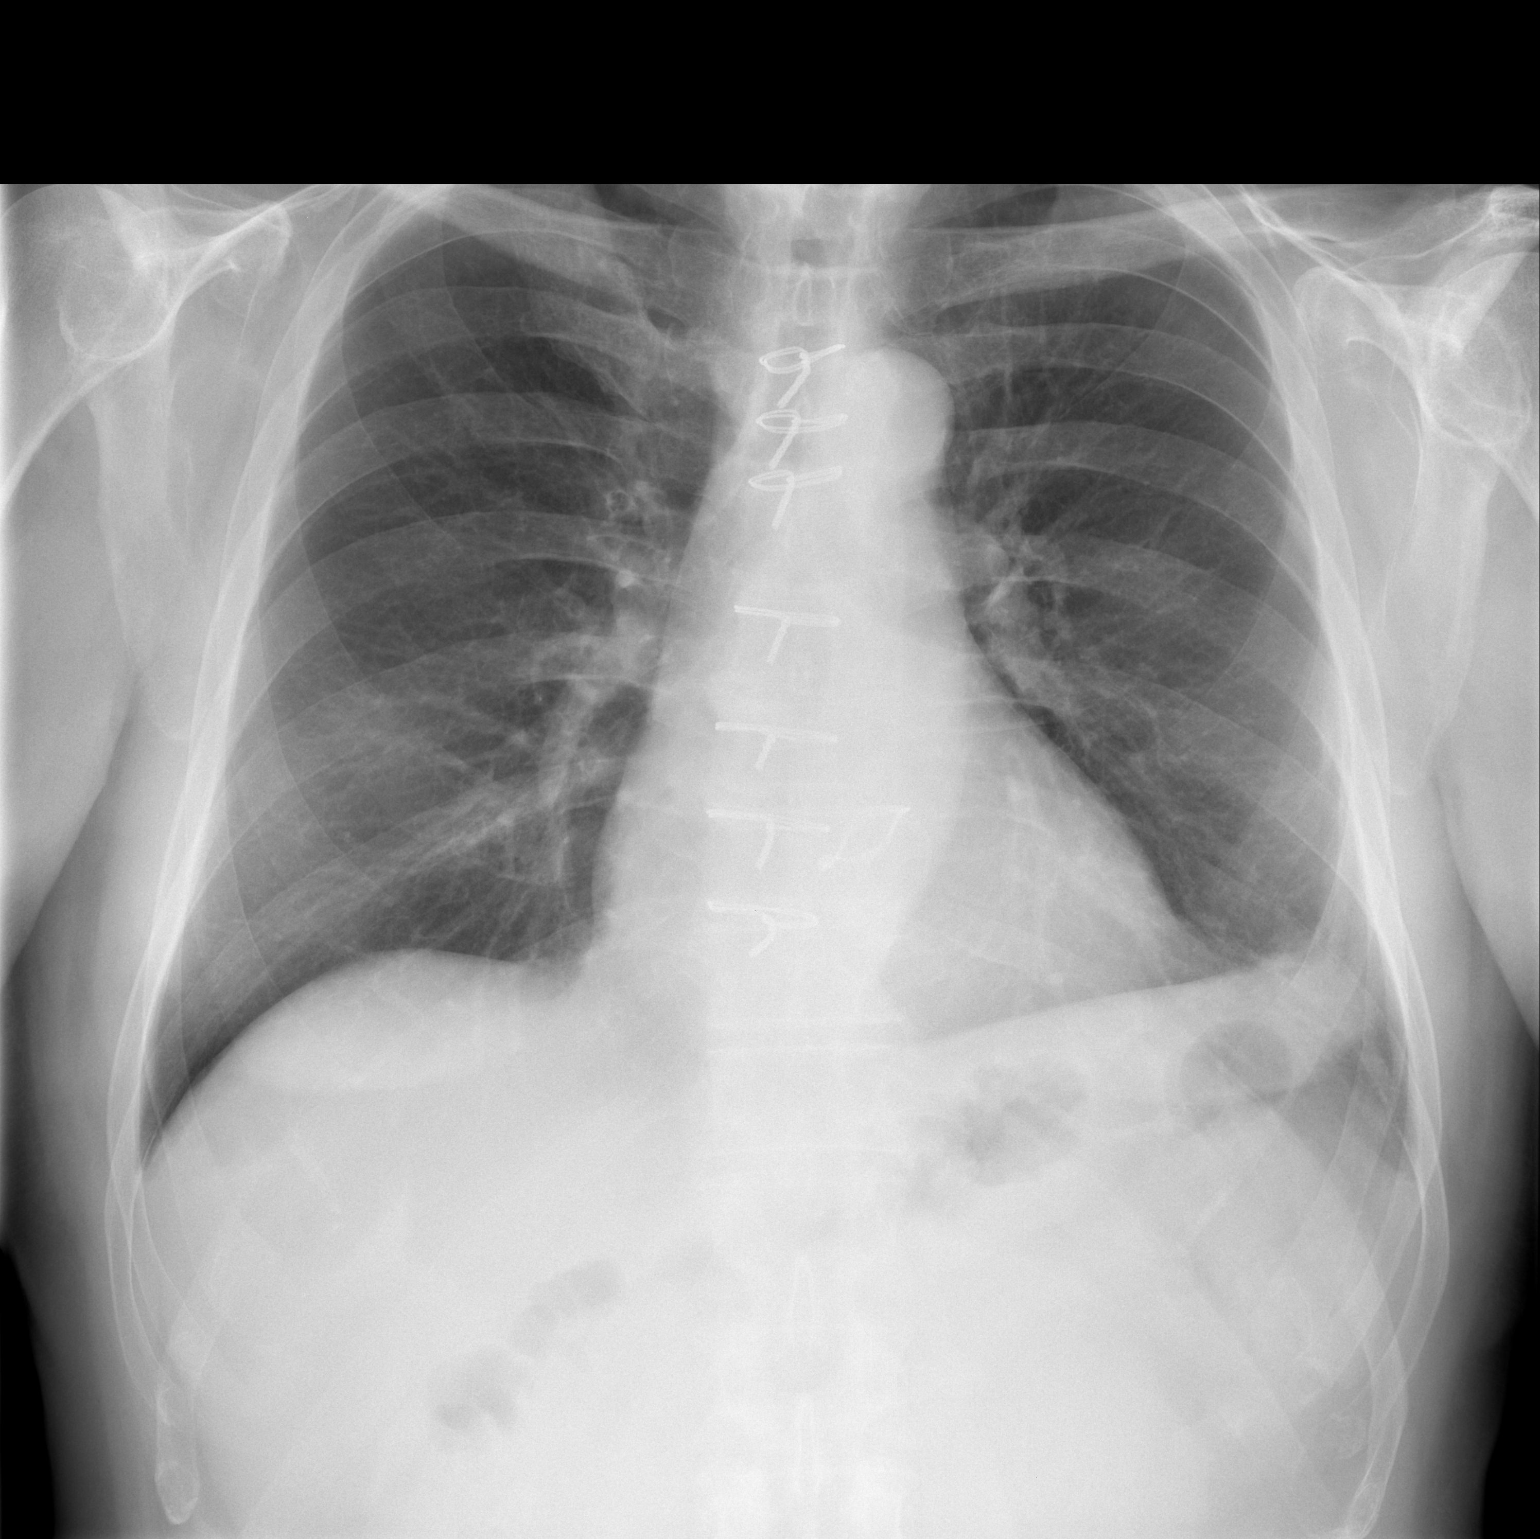

[w chest lat]
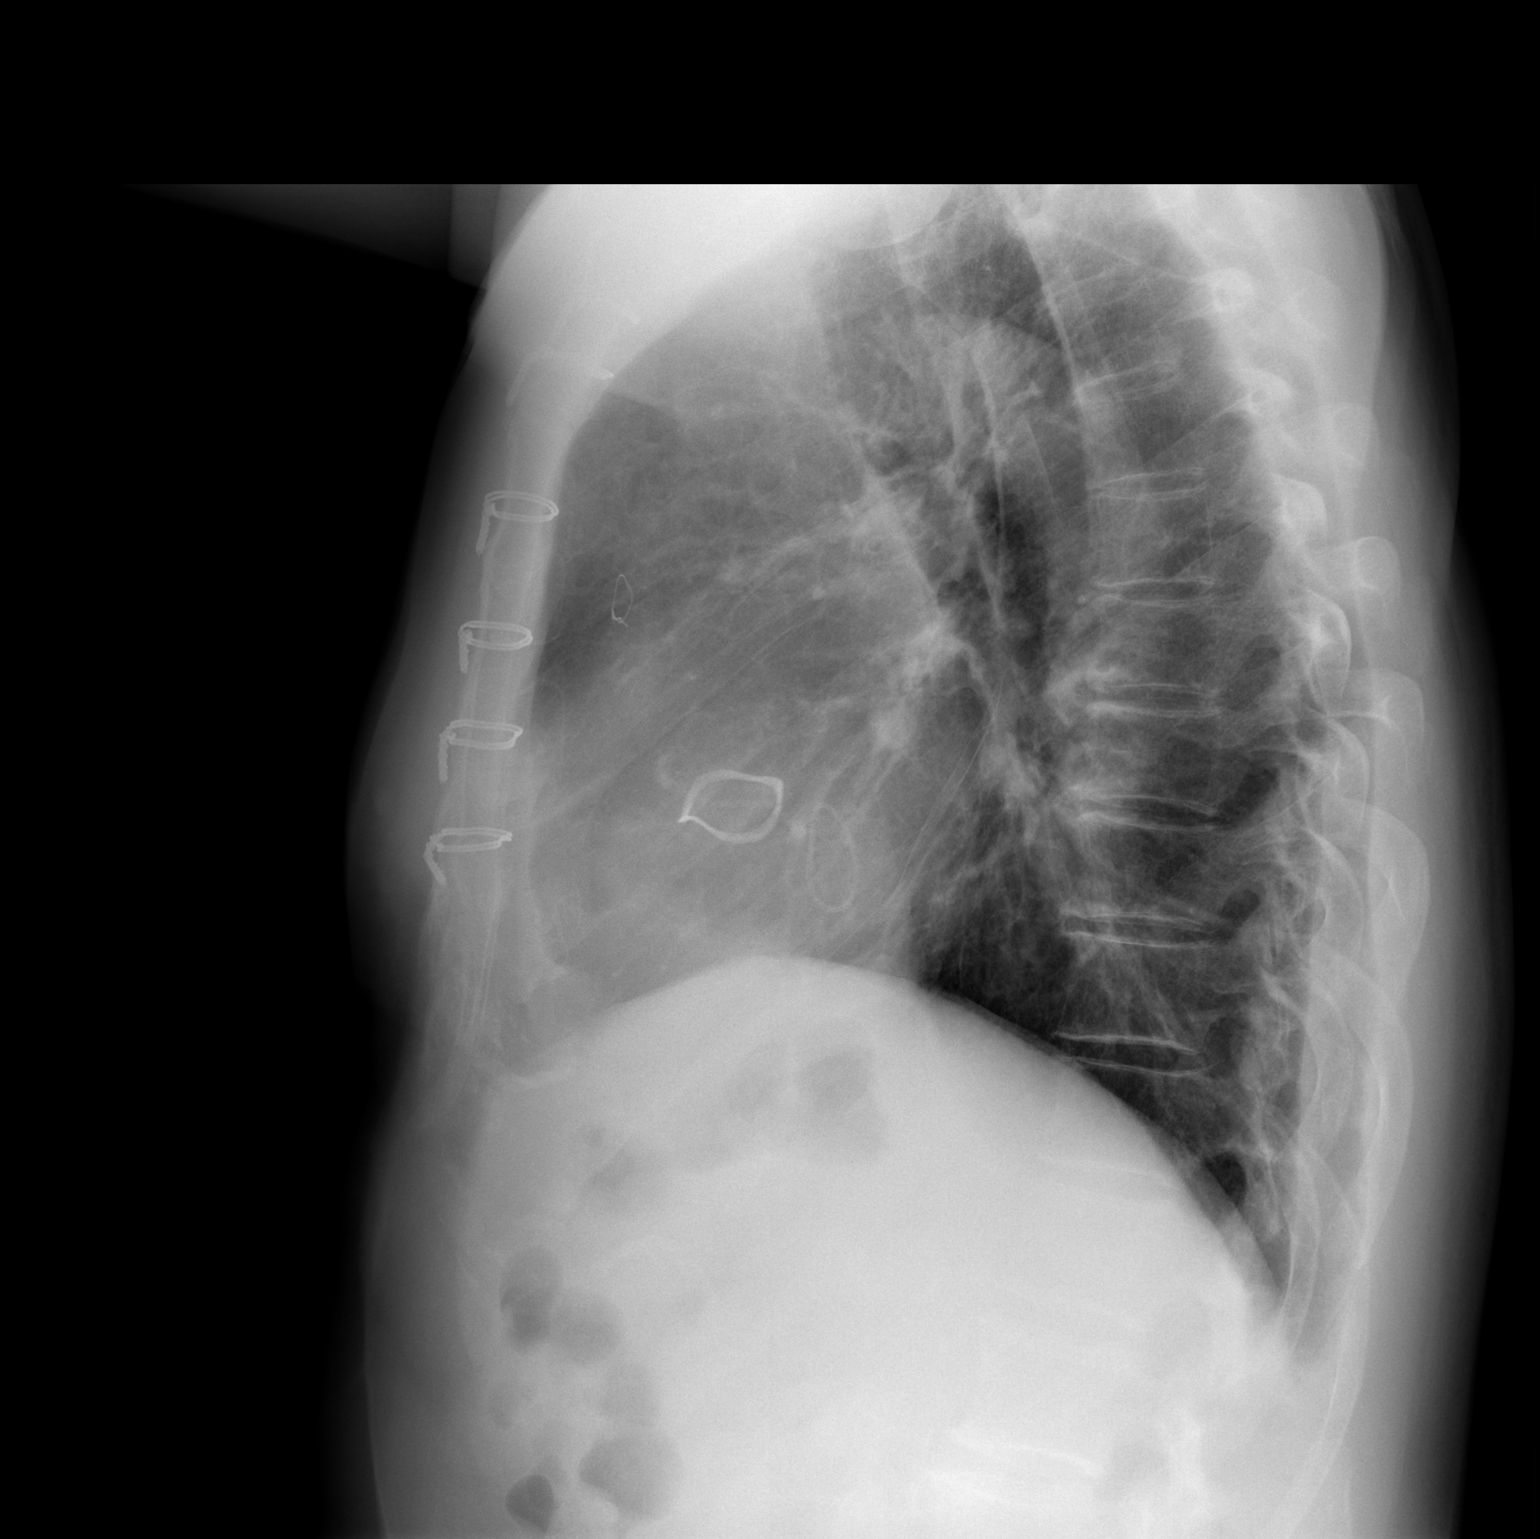

[2 of 2 positions shown; findings below may reference images not displayed]

FINDINGS: Two views of the chest show improved aeration with decrease in linear atelectasis bilaterally.  The small pleural effusions noted previously have resolved.  No pneumothorax is seen.  Heart size is stable.
IMPRESSION: Improved aeration with resolution of linear atelectasis and small effusions.

## 2007-08-07 ENCOUNTER — Encounter (HOSPITAL_COMMUNITY): Admission: RE | Admit: 2007-08-07 | Discharge: 2007-11-05 | Payer: Self-pay | Admitting: Cardiology

## 2007-11-06 ENCOUNTER — Encounter (HOSPITAL_COMMUNITY): Admission: RE | Admit: 2007-11-06 | Discharge: 2007-12-20 | Payer: Self-pay | Admitting: Cardiology

## 2009-02-25 LAB — COMPREHENSIVE METABOLIC PANEL WITH GFR: EGFR: 72.5

## 2010-08-23 ENCOUNTER — Other Ambulatory Visit: Payer: Self-pay | Admitting: Internal Medicine

## 2010-08-23 ENCOUNTER — Ambulatory Visit
Admission: RE | Admit: 2010-08-23 | Discharge: 2010-08-23 | Disposition: A | Discharge status: Home / Self Care | Payer: Medicare Other | Source: Ambulatory Visit | Attending: Internal Medicine | Admitting: Internal Medicine

## 2010-08-23 DIAGNOSIS — R1031 Right lower quadrant pain: Secondary | ICD-10-CM

## 2010-08-23 DIAGNOSIS — R935 Abnormal findings on diagnostic imaging of other abdominal regions, including retroperitoneum: Secondary | ICD-10-CM

## 2010-08-23 LAB — COMPREHENSIVE METABOLIC PANEL WITH GFR: EGFR: 59.1

## 2010-08-23 IMAGING — CT CT ABD-PELV W/ CM
3 of 5 series · 13 of 36 positions shown, 19 images · IV contrast (READICAT/WATER & [ID] OMNI 300)
Comparison: None.

CLINICAL DATA: Right lower quadrant pain

CT ABDOMEN AND PELVIS WITH CONTRAST
TECHNIQUE: Multidetector CT imaging of the abdomen and pelvis was
performed following the standard protocol during bolus
administration of intravenous contrast.
Contrast: 100 ml [RY]

[Series 3: routine abdomen · axial · 0.78mm/px · z∈[-364,+1]mm · 8 of 95 slices shown, 13 images]
[im 11/95  soft-tissue]
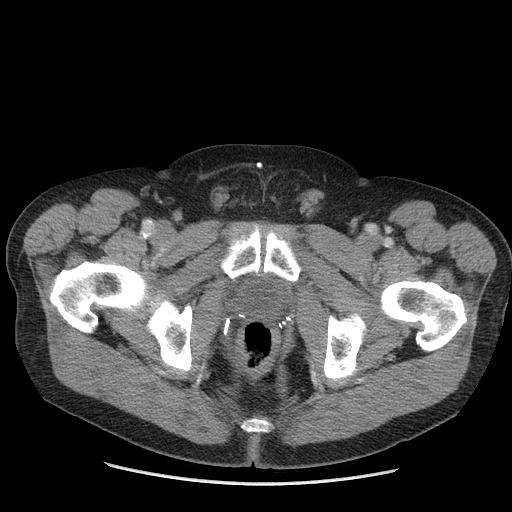
[im 11/95  bone]
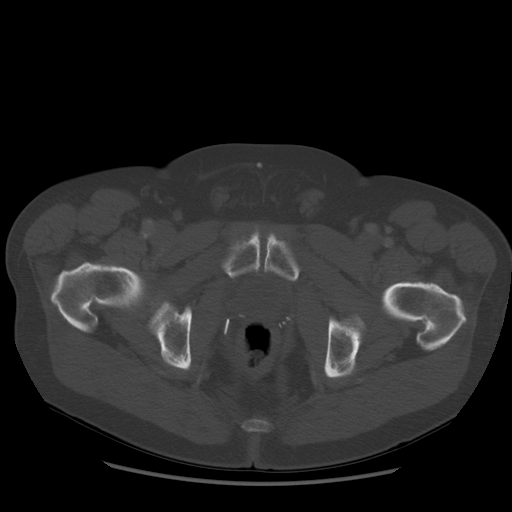
[im 21/95  soft-tissue]
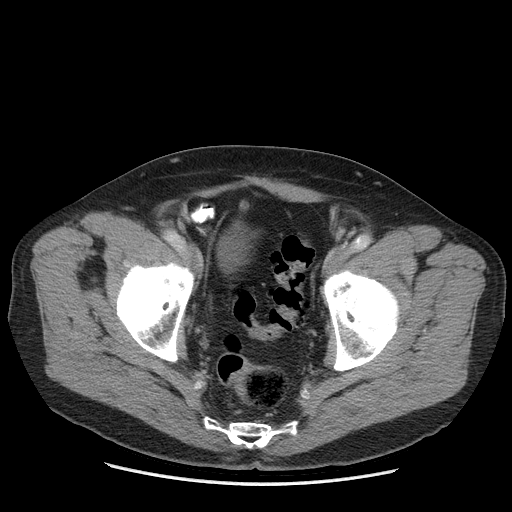
[im 32/95  soft-tissue]
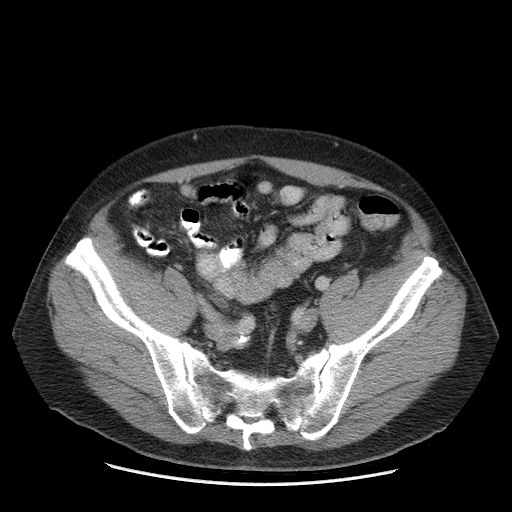
[im 42/95  soft-tissue]
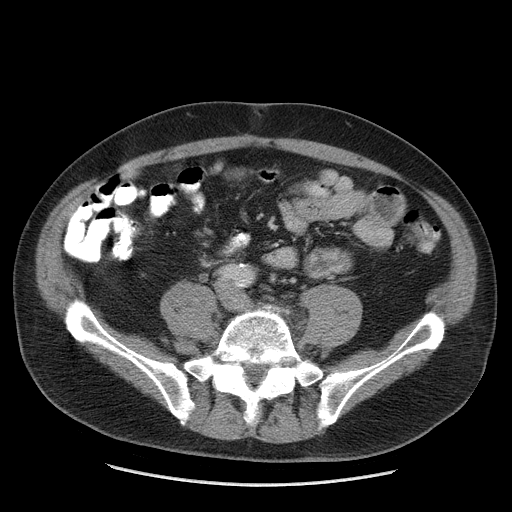
[im 53/95  soft-tissue]
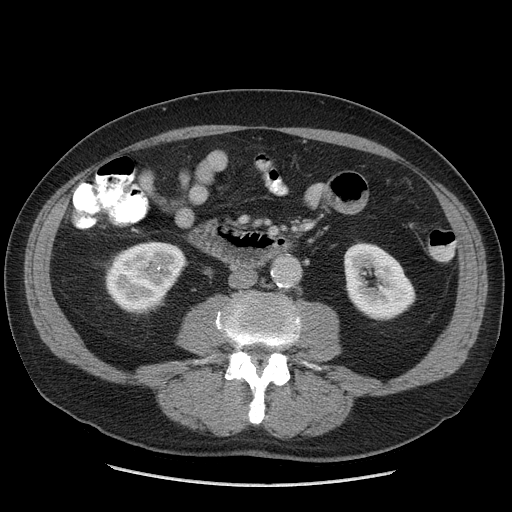
[im 53/95  lung]
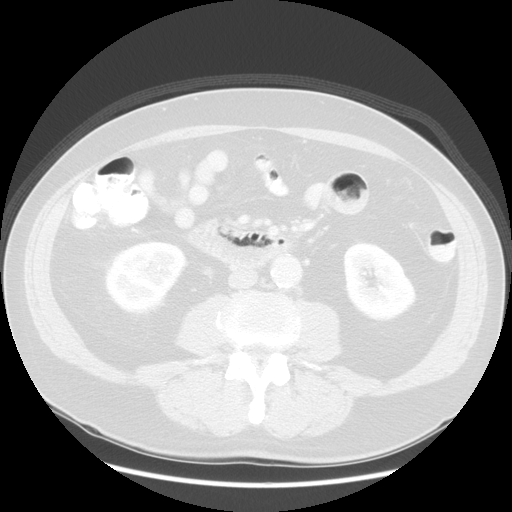
[im 63/95  soft-tissue]
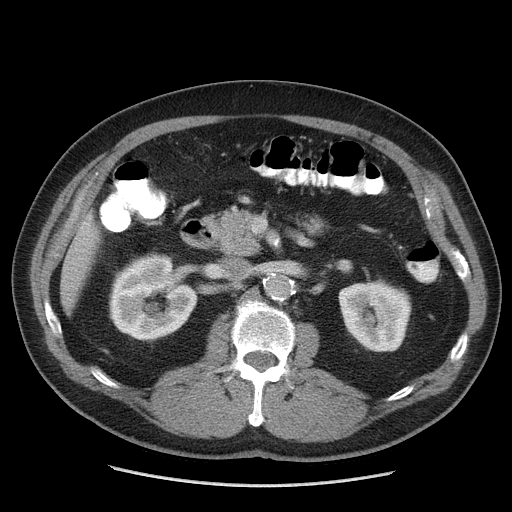
[im 63/95  lung]
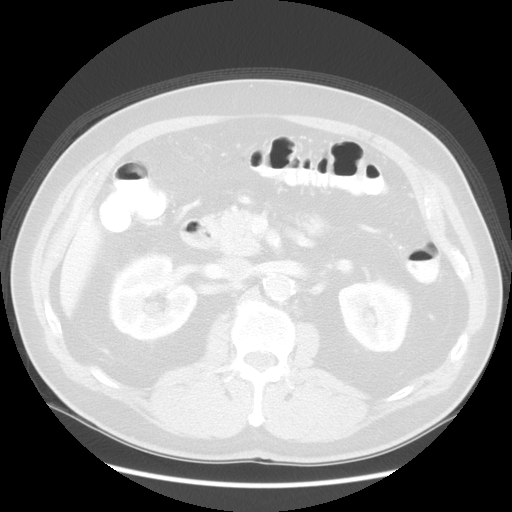
[im 74/95  soft-tissue]
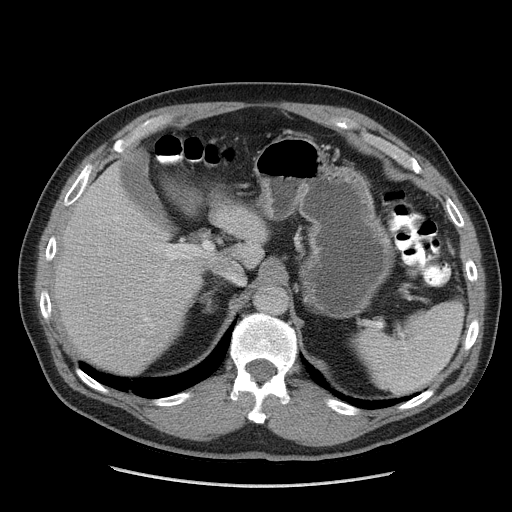
[im 74/95  lung]
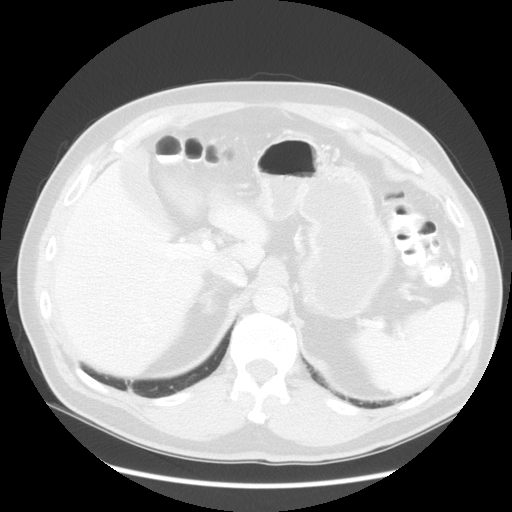
[im 84/95  soft-tissue]
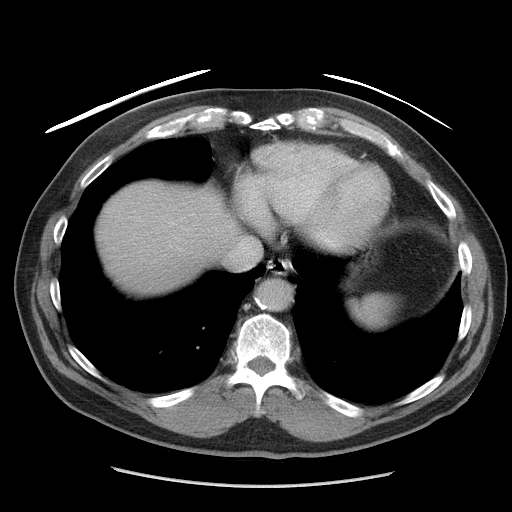
[im 84/95  lung]
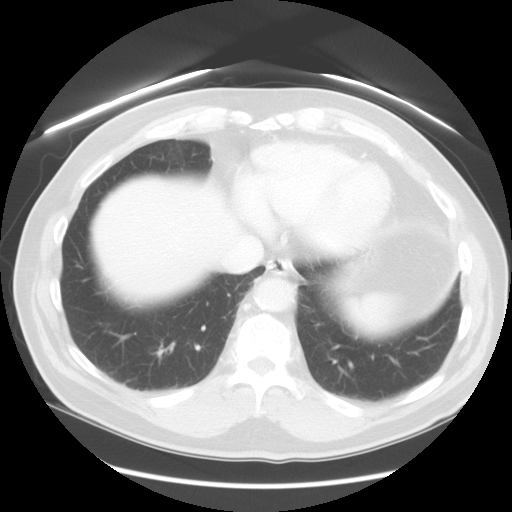

[Series 601: coronal body · coronal · 0.97mm/px · 1 of 126 slices shown, 2 images]
[im 42/126  soft-tissue]
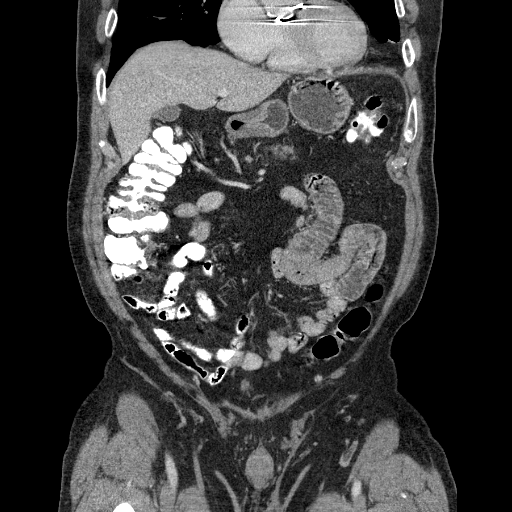
[im 42/126  bone]
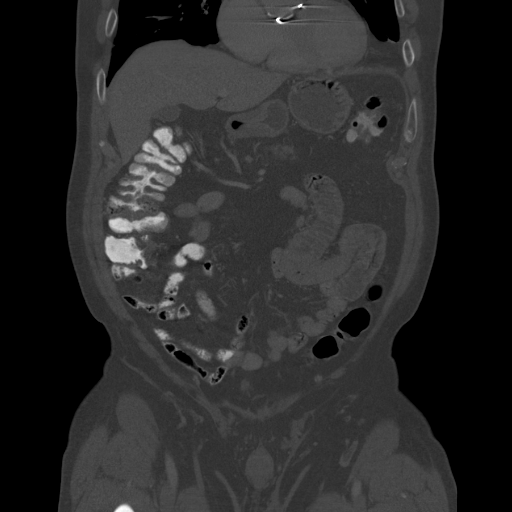

[Series 602: sagittal body · sagittal · 0.97mm/px · 4 of 160 slices shown]
[im 11/160  soft-tissue]
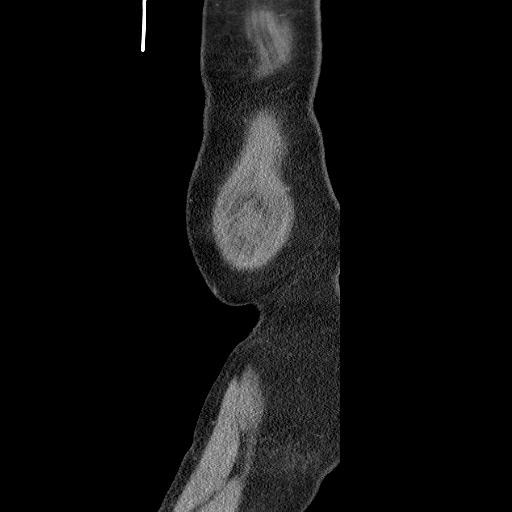
[im 32/160  soft-tissue]
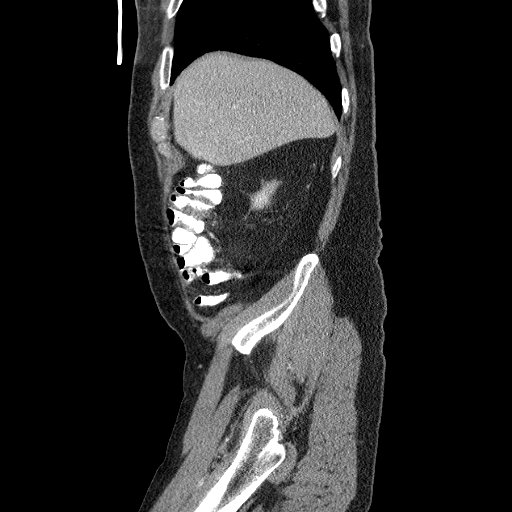
[im 54/160  soft-tissue]
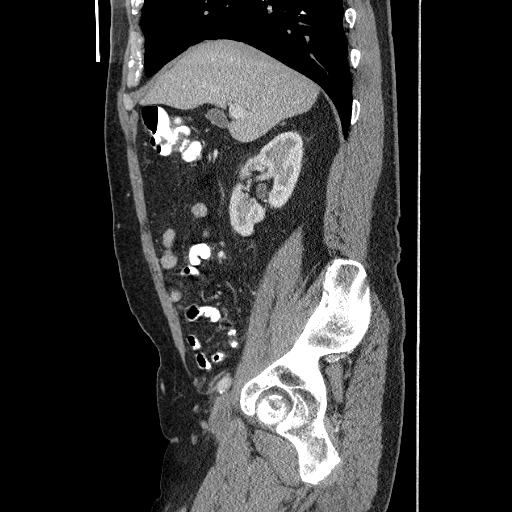
[im 75/160  soft-tissue]
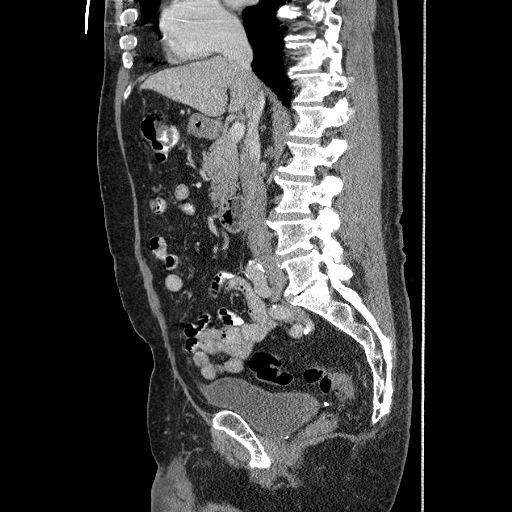

[13 of 36 positions shown; findings below may reference images not displayed]

FINDINGS: Normal appendix.

Small umbilical hernia contains only adipose.  No evidence of bowel
obstruction.

Liver, gallbladder, spleen, pancreas, adrenal glands are within
normal limits.

Chronic changes of the kidneys.  Tiny calculus is present in the
lower pole of the right kidney.  Slight asymmetrical stranding is
present in the perinephric space about the lower pole of the right
kidney. Sub centimeter hypodensities are present in the right
kidney.

No free fluid.  No adenopathy.

Small right hydrocele in the scrotum.  Post prostatectomy changes.
IMPRESSION: Normal appendix.

Right nephrolithiasis.

Inflammatory change of the lower pole of the right kidney is
suspected.  Correlate with urinalysis.  These findings may simply
represent chronic changes.

Tiny hypodensities in the right kidney.  Recommend ultrasound to
further characterize.

## 2010-08-23 MED ORDER — IOHEXOL 300 MG/ML  SOLN
100.0000 mL | Freq: Once | INTRAMUSCULAR | Status: AC | PRN
  Administered 2010-08-23: 100 mL via INTRAVENOUS

## 2010-08-30 ENCOUNTER — Ambulatory Visit
Admission: RE | Admit: 2010-08-30 | Discharge: 2010-08-30 | Disposition: A | Discharge status: Home / Self Care | Payer: Medicare Other | Source: Ambulatory Visit | Attending: Internal Medicine | Admitting: Internal Medicine

## 2010-08-30 DIAGNOSIS — R935 Abnormal findings on diagnostic imaging of other abdominal regions, including retroperitoneum: Secondary | ICD-10-CM

## 2010-08-30 IMAGING — US US RENAL
1 series · 14 of 25 positions shown · non-contrast
Comparison: CT abdomen pelvis [DATE]

CLINICAL DATA: Abnormal CT with right renal hypodensities.

RENAL/URINARY TRACT ULTRASOUND COMPLETE

[Series 1: us renal · 0.24mm/px · 14 of 28 slices shown]
[im 1/28]
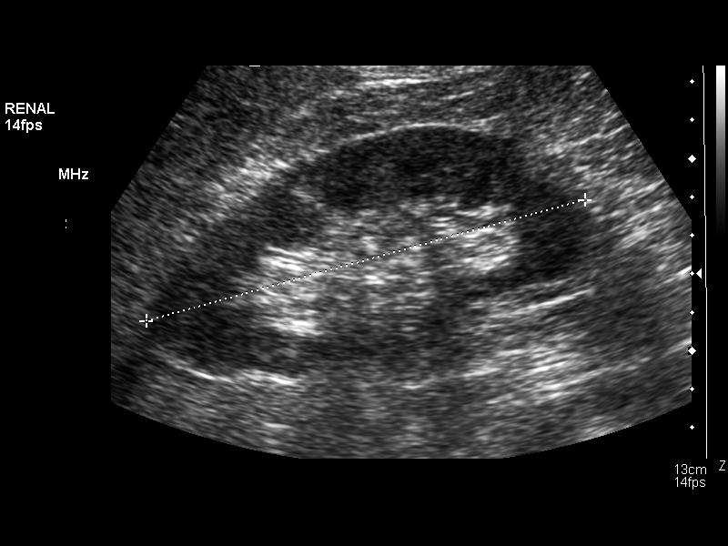
[im 3/28]
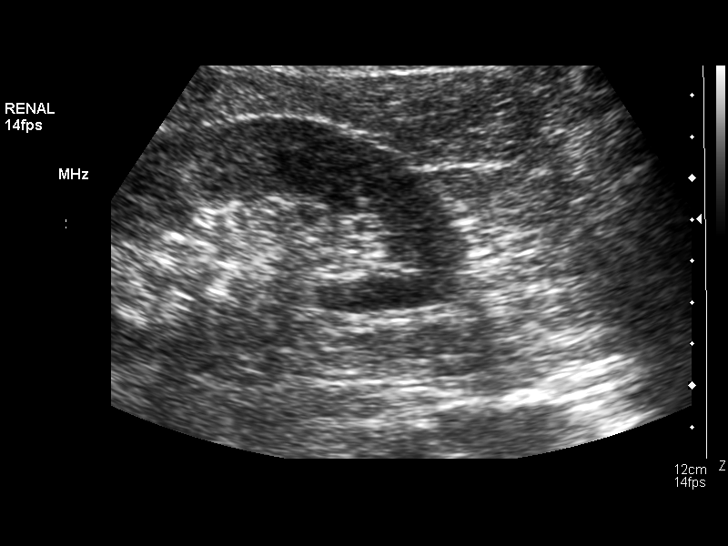
[im 5/28]
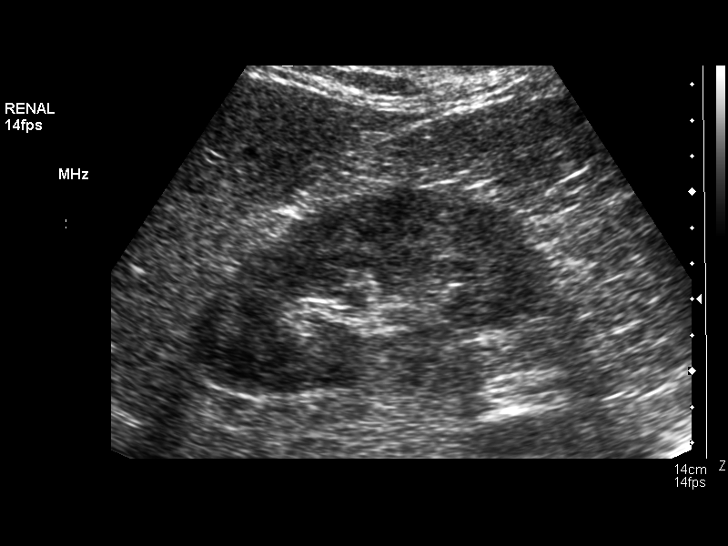
[im 7/28]
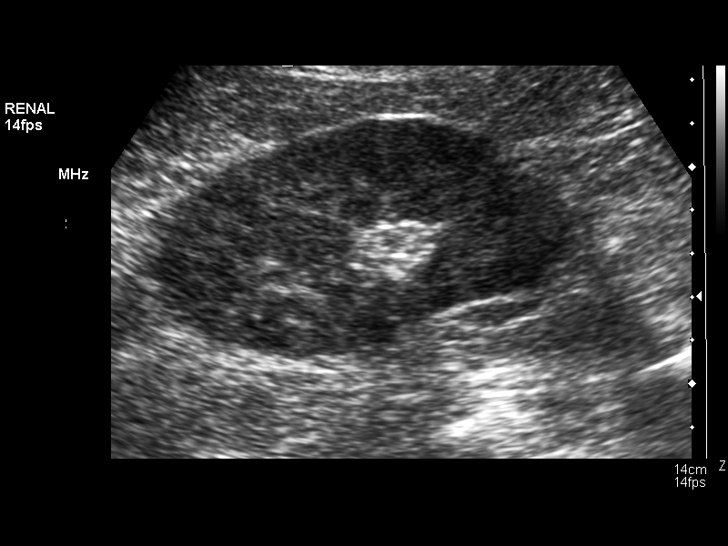
[im 10/28]
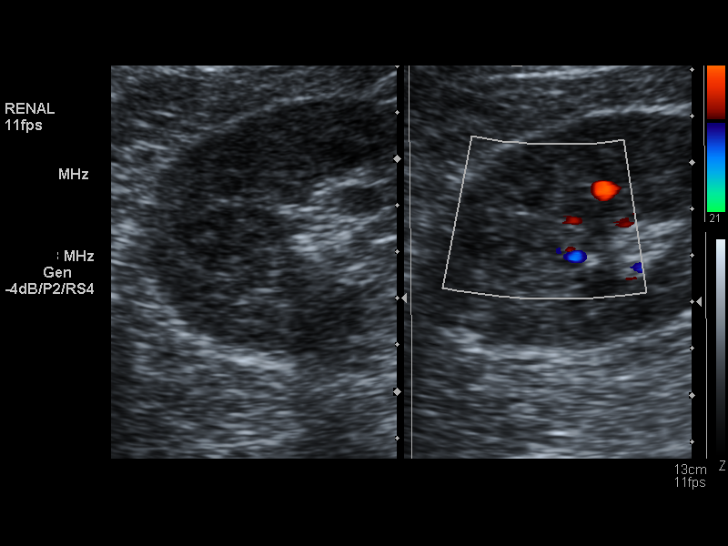
[im 11/28]
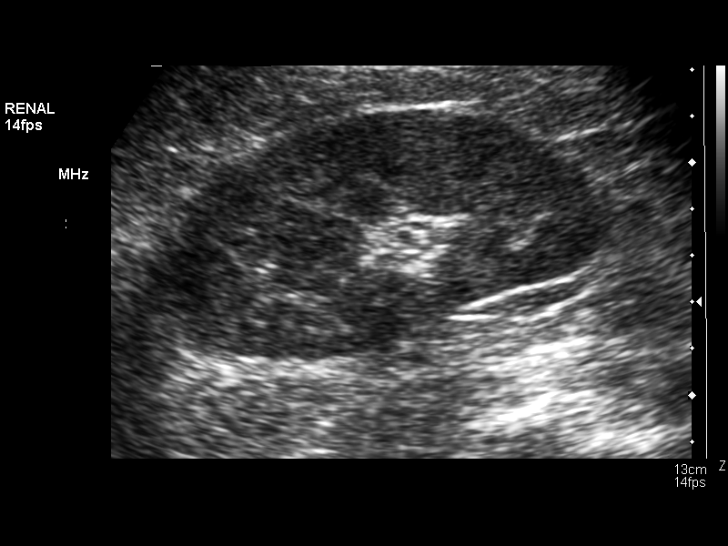
[im 13/28]
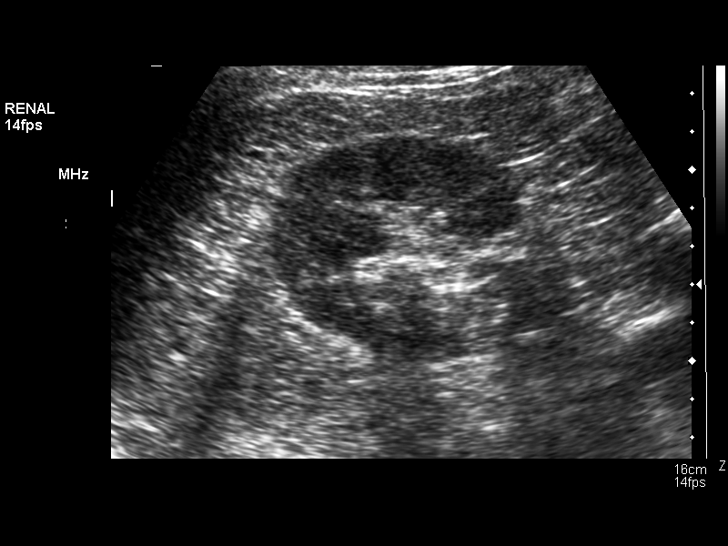
[im 15/28]
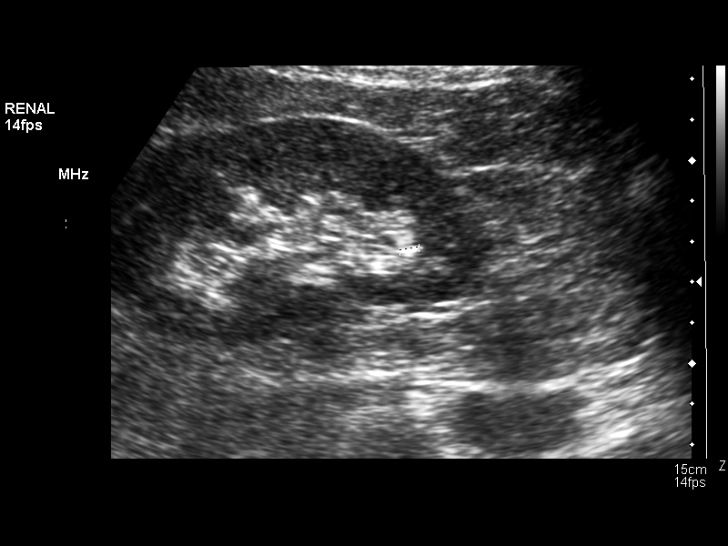
[im 17/28]
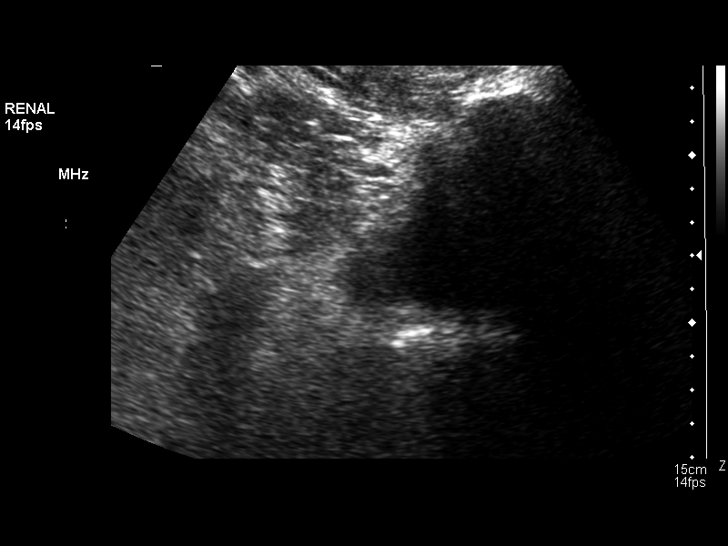
[im 19/28]
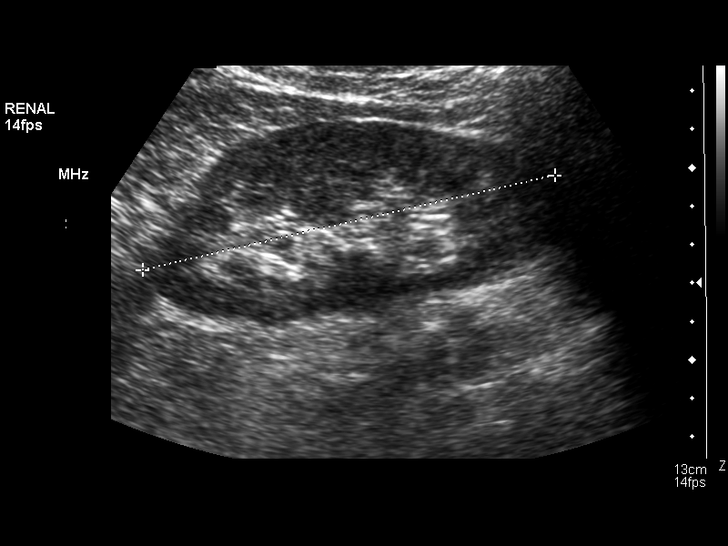
[im 21/28]
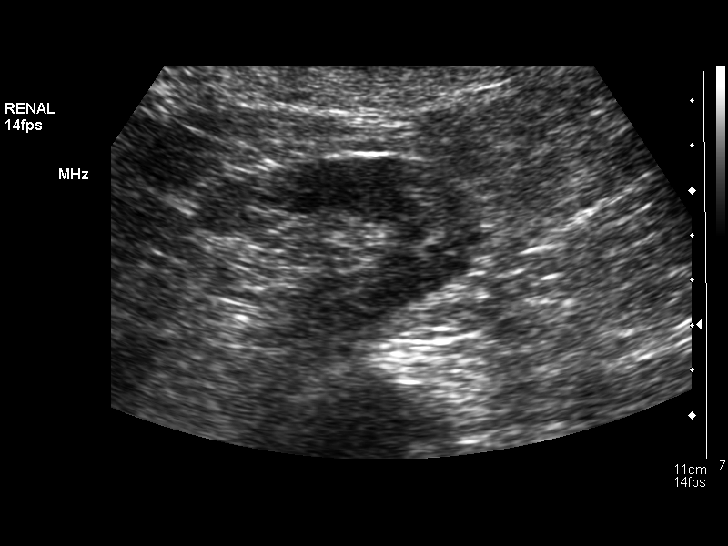
[im 23/28]
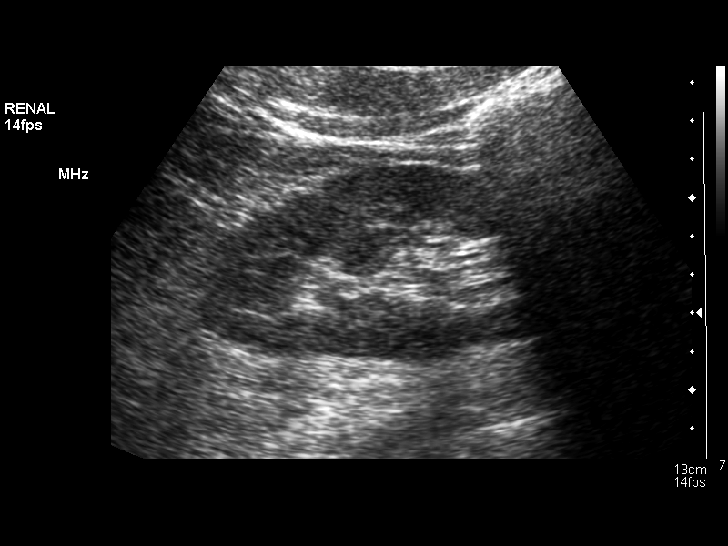
[im 25/28]
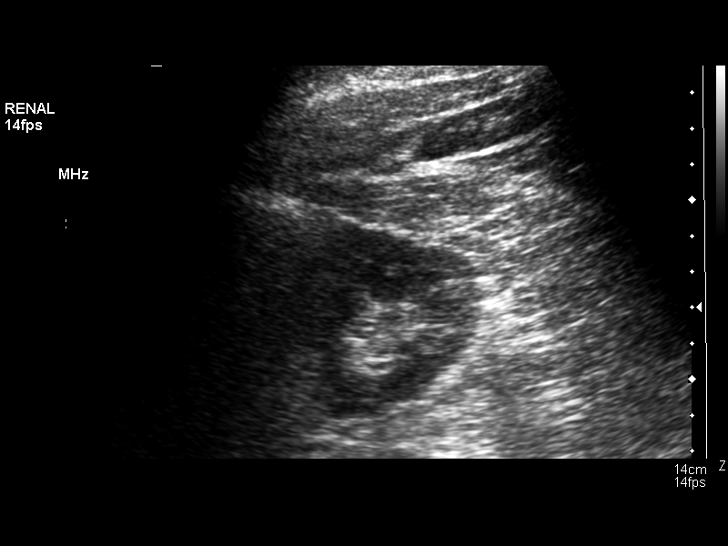
[im 28/28]
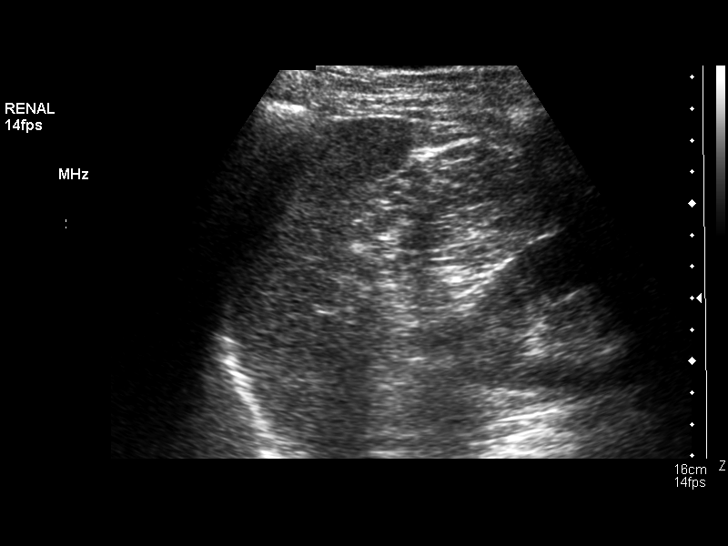

[14 of 25 positions shown; findings below may reference images not displayed]

FINDINGS: Right Kidney:  Measures 12.0 cm with uniform parenchymal echo
texture.  A 7 x 6 x 5 mm hypoechoic lesion is seen in the
interpolar region.  Its internal characteristics are difficult to
confirm due to small size.  Also, difficult to confirm posterior
acoustic enhancement.  A 5 mm echogenic lesion in the lower pole is
noted.  No hydronephrosis.

Left Kidney:  Measures 11.0 cm with normal parenchymal echo
texture.  No hydronephrosis.

Bladder:  Visualization of the bladder is limited by bowel gas.
IMPRESSION: 1.  Tiny lesion in the right kidney is difficult to definitively
characterize by ultrasound due to small size.  Statistically, it
likely represents a cyst.
2.  Right renal stone.

## 2010-10-03 NOTE — Op Note (Signed)
Eric [name], EMMICK NO.:  [id number]   MEDICAL RECORD NO.:  [id number]          PATIENT TYPE:  INP   LOCATION:  2311                         FACILITY:  [facility]   PHYSICIAN:  Evelene Croon, M.D.     DATE OF BIRTH:  05-Sep-1929   DATE OF PROCEDURE:  06/19/2007  DATE OF DISCHARGE:                               OPERATIVE REPORT   PREOPERATIVE DIAGNOSIS:  Severe aortic stenosis, severe mitral  regurgitation, and single vessel coronary artery disease.   POSTOPERATIVE DIAGNOSIS:  Severe aortic stenosis, severe mitral  regurgitation, and single vessel coronary artery disease.   OPERATIVE PROCEDURE:  Median sternotomy, extracorporeal circulation,  coronary bypass graft surgery x1 using a saphenous vein graft to the  obtuse marginal branch of the left circumflex coronary, mitral valve  repair with quadrangular resection of flail segment of posterior mitral  leaflet and ring annuloplasty, aortic valve replacement with a 23 mm  Mitroflow pericardial valve, ligation of left atrial appendage, and  endoscopic vein harvesting from the left leg.   SURGEON:  Evelene Croon, M.D.   ASSISTANT:  Rowe Clack, P.A.-C.   ANESTHESIA:  General endotracheal.   CLINICAL HISTORY:  This patient is a 77 year old gentleman referred by  Dr. Corliss Marcus.  He had a remote history of aortic sclerosis and mild  stenosis in the past since 2003.  His valve area at that time was  reportedly 1.7 cm2.  He had a repeat echocardiogram done in May 2008  which showed normal ejection fraction of 70% with mild left ventricular  hypertrophy.  There was moderate to severe aortic stenosis and a valve  area of 1.1 cm2.  There is mild mitral annular calcification and mild  posterior mitral leaflet prolapse with a moderate anteriorly directed  jet of mitral regurgitation.  The decision was made to continue  following him.  Repeat echocardiogram was done in December 2008 which  showed worsening of his aortic  stenosis with a transaortic velocity of 4  meters per second with a peak gradient of 64 and a mean gradient of 30.  The valve area had decreased to 0.9 cm2.  Ejection fraction remained  normal at 65-70%.  The mitral valve now demonstrated a flail segment of  the posterior mitral leaflet with severe mitral regurgitation and  systolic flow reversal in the pulmonary veins.  He remained essentially  asymptomatic but was referred for surgical treatment due to severe  mitral regurgitation and severe aortic stenosis.  Cardiac  catheterization was performed on May 17, 2007, and showed normal  left ventricular systolic function.  The peak to peak gradient across  the aortic valve was 48 with a mean gradient of 33 and a valve area  calculated at 0.95 cm2 by FICK and 0.88 cm2 by thermodilution.  Cardiac  index is 2.6.  Coronary angiography showed about a 30% eccentric  narrowing of the ostium of the LAD.  The left circumflex showed 80%  tubular proximal stenosis involving a large marginal branch.  The right  coronary had no significant disease.   After review of the studies and examination of  the patient, I agreed  that mitral valve repair and aortic valve replacement as well as  coronary bypass to the obtuse marginal branch was the best treatment to  prevent congestive heart failure symptoms and deterioration of left  ventricular function.  I discussed the operative procedure with the  patient and his wife.  We discussed the alternatives for aortic valve  prosthesis including mechanical and tissue valves.  I recommended a  tissue valve given his age of 58 years with some coronary disease and a  history of prostate cancer.  I felt it was very unlikely that he would  outlive a tissue valve.  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  I  also discussed  the possibility of mitral valve replacement if it could  not be repaired but told him there was at least a 98% likelihood that it  was a repairable lesion.  I discussed the benefits and risks of surgery  including but not limited to bleeding, blood transfusion, infection,  stroke, myocardial infarction, graft failure, heart block requiring  permanent pacemaker, perivalvular leak, eventual degeneration of his  aortic valve requiring replacement, and death.  He understood all this  and agreed to proceed.   OPERATIVE PROCEDURE:  The patient was taken to the operating room and  placed on the table in a supine position.  After induction of general  endotracheal anesthesia, a Foley catheter was placed in bladder using  sterile technique.  Then, the chest, abdomen and both lower extremities  were prepped and draped in the usual sterile manner.  Preoperative  intravenous antibiotics were given.  Then, transesophageal  echocardiogram was performed.  This showed a flailed middle segment of  the posterior mitral leaflet secondary to a ruptured chordae.  There was  severe 4+ mitral regurgitation with anteriorly directed jet.  The  remainder of the mitral valve appeared structurally fairly normal with  mild mitral annular calcification.  The aortic valve was heavily  calcified and immobile with severe stenosis and mild regurgitation.  Left ventricular function appeared well preserved.   Then, the chest was opened through a median sternotomy incision and the  pericardium opened in the midline.  Examination of the heart showed good  ventricular contractility.  The ascending aorta had no palpable plaques  in it.  There was some calcified plaque in the aortic root just above  the coronary level.  Then, a segment of greater saphenous vein was  harvested from the left leg using endoscopic vein harvest technique.  This vein was of medium size and good quality.  Then, the patient was  heparinized and when  an adequate activated clotting time was achieved,  the distal ascending aorta was cannulated using a 20-French aortic  cannula for arterial inflow.  Venous outflow was achieved using bicaval  venous cannulation with a 24-French metal tipped right angle cannula  placed through a pursestring suture in the superior vena cava and a 34-  [place] plastic right-angle cannula placed through a pursestring suture  in the low right atrium.  An antegrade cardioplegia and vent cannula was  inserted in the aortic root.  A retrograde cardioplegic cannula was  inserted through the right atrium in the coronary sinus.   The patient placed on cardiopulmonary bypass and the distal coronaries  identified.  The obtuse marginal was  a large graftable vessel with no  distal disease in it.  Then, the aorta was cross clamped and 1000 mL of  cold blood antegrade cardioplegia was administered in the aortic root  with quick arrest the heart.  Systemic hypothermia to 28 degrees  centigrade and topical hypothermia with iced saline was used.  A  temperature probe was placed in the septum and insulating pad in the  pericardium.  Additional doses of retrograde cardioplegia were given  throughout the procedure at about 20 minute intervals to maintain  myocardial temperature at around 10 degrees centigrade.   Then, the first distal anastomosis was performed to the obtuse marginal  branch.  The internal diameter was about 2 mm.  The conduit used was the  segment of greater saphenous vein.  The anastomosis was performed in an  end-to-side manner using continuous 7-0 Prolene suture.  Flow was noted  through the graft and was excellent.   Then, attention was turned to mitral valve repair.  The left atrium was  opened through a vertical incision in the interatrial groove.  The  superior and inferior vena cavae were mobilized.  The right pulmonary  artery was also mobilized from the superior vena cava and left atrium to  allow  adequate exposure.  The mitral retractor was placed.  Exposure of  the mitral valve was quite good.  Examination of the valve showed that  there was a ruptured chordae to the P2 segment of the posterior mitral  leaflet.  The remainder of the anterior and posterior leaflets were  structurally normal with no sign of prolapse.  Then, a quadrangular  resection was performed of the posterior mitral leaflet to remove the  segment of leaflet attached to the ruptured chord.  This was a fairly  small segment in width and, therefore, compression sutures in the  annulus were not needed.  The posterior mitral leaflet was then  reapproximated with interrupted inverting 4-0 find sutures.  The valve  then tested with iced saline solution and was completely competent even  under full distention of the left ventricle with a lock down of the  valve.  Then a series of 2-0 Ethibond sutures were placed around the  mitral annulus.  Then, the anterior leaflet as well as the inter-  trigonal distance were sized and a 28 mm Sorin Meno III-V mitral  annuloplasty ring was chosen.  This was a semi-rigid ring.  It had  catalog number [id number] and serial number [id number].  The sutures were then  placed through the annuloplasty band and the band was lowered into  place.  The sutures were tied sequentially.  The annuloplasty ring was  well seated.  The valve was again tested with iced saline solution and  was completely competent with no regurgitation.  Then, the left atrial  appendage was closed in two layers using continuous 3-0 Prolene suture.  The left atriotomy incision was reapproximated with two layers of  continuous 3-0 Prolene suture.  The sutures were not tied, at this  point, and a vent catheter was left in the atrium while the aortic valve  was being replaced.   Then, the aorta was opened transversely at the level of the sinotubular  junction.  There was some calcification of the aortic root and this was  not  disturbed.  The right and left coronary ostia were identified.  Examination of the native valve showed that there were three leaflets  that were heavily calcified and immobile.  There  was marked mitral  aortic annular calcification.  Then, the native valve was excised.  Rongeurs were used to decalcify the aortic annulus.  Care was taken to  remove all particulate debris and then the left ventricle and aortic  root were irrigated with a large amount of iced saline solution.  The  annulus was sized and a 23 mm Sorin Mitroflow aortic pericardial heart  valve was chosen.  This had catalog number [id number] and serial number  [id number].  Then, a series of 2-0 Ethibond horizontal mattress sutures were  placed around the aortic annulus with the pledgets in a subannular  position.  The sutures were placed through the sewing ring and the valve  lowered in place.  The sutures were tied sequentially.  The valve seated  nicely.  The coronary ostia were not obstructed.  Then, the aortotomy  was closed in two layers using continuous 4-0 Prolene suture.   The single proximal vein graft anastomosis was then performed to the  aortic root in an end-to-side manner using continuous 6-0 Prolene  suture.  Then, the left ventricular vent was removed and the left atrial  closure was completed as described above.  Then, the heart was  completely de-aired.  The head was placed in Trendelenburg position and  the crossclamp removed with a time of 175 minutes.  There was  spontaneous return of sinus rhythm.  The proximal and distal anastomosis  was hemostatic.  The lie of the grafts satisfactory.  A graft marker was  placed around the proximal anastomoses.  Two temporary right ventricular  and right atrial pacing wires were placed above the skin.   When the patient rewarmed 37 degrees centigrade, he was weaned from  cardiopulmonary bypass on no inotropic agents.  Total bypass time was  199 minutes.  Cardiac function  appeared excellent.  Transesophageal  echocardiogram showed normal aortic valve prosthesis with no evidence of  perivalvular leak or regurgitation.  Mitral valve repair showed no  mitral regurgitation and no evidence of mitral stenosis.  Then,  protamine was given, the venous and aortic cannulae were removed without  difficulty.  Hemostasis was achieved.  The patient was given 10 units of  platelets due to thrombocytopenia.  Then, two chest tubes were placed  with two in the posterior pericardium and one in the anterior  mediastinum.  The pericardium was loosely reapproximated over the heart.  The sternum was closed with double #6 stainless steel wires.  The fascia  was closed with continuous #1 Vicryl suture.  The subcutaneous tissue  was closed with continuous 2-0 Vicryl and the skin with 3-0 Vicryl  subcuticular closure.  The lower extremity vein harvest site was closed  in layers in a similar manner.  The sponge, needle and instrument counts  were correct according to the scrub nurse.  Dry sterile dressings were  applied over the incisions around the chest tubes which were hooked to  Pleur-Evac suction.  The patient remained hemodynamically stable and was  transferred to the SICU in guarded but stable condition.     Evelene Croon, M.D.  Electronically Signed    BB/MEDQ  D:  06/19/2007  T:  06/19/2007  Job:  [id number]   cc:   Francisca December, M.D.

## 2010-10-03 NOTE — Cardiovascular Report (Signed)
NAME:  LANEY, [name] NO.:  [id number]   MEDICAL RECORD NO.:  [id number]          PATIENT TYPE:  OIB   LOCATION:  2854                         FACILITY:  [facility]   PHYSICIAN:  Eric [name], M.D.  DATE OF BIRTH:  08/06/1929   DATE OF PROCEDURE:  05/12/2007  DATE OF DISCHARGE:                            CARDIAC CATHETERIZATION   PROCEDURES PERFORMED:  1. Right and left heart catheterization.  2. Left ventriculogram.  3. Coronary angiography.   INDICATIONS:  Mr. Eric [name] is a 77 year old man with known aortic  stenosis, severe, and moderately-severe to severe mitral regurgitation  with questionable partial flail leaflet of the posterior leaflet mitral  valve.  He is relatively asymptomatic but has had significant  progression of disease over the past 6-9 months.  He is brought  therefore to the catheterization laboratory at this time for the above-  mentioned procedures to reassess the degree of valvular disease and  evaluate for the possibility of coronary artery disease in preparation  for valve replacement surgery.   PROCEDURAL NOTE:  The patient is brought to the cardiac catheterization  laboratory in the fasting state.  The right groin was prepped and draped  in the usual sterile fashion.  Local anesthesia was obtained with  infiltration of 1% lidocaine.  A 7-French catheter sheath was inserted  percutaneously into the right femoral vein.  In a similar fashion a 5-  [place] catheter sheath was inserted percutaneously into the right  femoral artery.  A 7-French balloon flow-directed Swan-Ganz catheter was  then advanced through the right heart chambers to the pulmonary artery  wedge position.  Pressure was recorded with the catheter in the  pulmonary wedge position, the main pulmonary artery, the right  ventricle, and the right atrium.  Blood samples for oxygen saturation  determination were obtained from the superior vena cava, the inferior  vena  cava, the main pulmonary artery, and nonsimultaneously from the  ascending aorta.  Thermodilution cardiac outputs were also obtained.  The Swan-Ganz catheter was then withdrawn into the inferior vena cava.  A 5-French 110 cm pigtail catheter was then advanced to the ascending  aorta where pressure was recorded.  Using a straight guidewire I was  able to enter into the left ventricle and advanced the catheter.  Pressure was again recorded in the left ventricle.  I then performed a  30-degree RAO cine left ventriculogram utilizing a power injector; 42 mL  were injected at 13 mL per second.  A 60-degree RAO 8-degrees  caudocranial left ventriculogram was then performed again using a power  injector; 45 mL were injected at 13 mL per second.  The pressure was  recorded with the catheter being pulled back across the aortic valve.  It was then exchanged for a 5-French #4 left Judkins catheter.  Cineangiography of the left coronary artery was conducted in multiple  LAO and RAO projections.  The left coronary catheter was exchanged for a  6-French #4 right Judkins catheter.  I could not adequately torque the  catheter into the right coronary.  It was therefore removed and  exchanged  for a no-torque right coronary catheter.  This did  successfully cannulate the right coronary and cineangiography was  performed in LAO and RAO projections.  At the completion of procedure  the catheter and catheter sheaths were removed.  Hemostasis was achieved  by direct pressure.  The patient was transported to the recovery area in  stable condition with an intact distal pulse.   HEMODYNAMICS:  Utilizing the Fick principle and an estimated oxygen  consumption of 250 mL per minute, the calculated cardiac output was 5.5  L per minute and index 2.6 L per minute per meters squared.  By  thermodilution, the cardiac output was 5.9 L per minute and index 2.74 L  per minute per meters squared.  There was a 48-mm  peak-to-peak gradient  across the aortic valve and a 32.7 mm mean gradient.  Utilizing the  Gorlin formula and the Fick cardiac output, the calculated aortic valve  area was 0.95 sqcm.  Utilizing the thermodilution cardiac output, the  aortic valve area was 0.88 sqcm.   Right heart pressures were not elevated and are as follows:  Right  atrium A-wave 7 mmHg, V-wave 5 mmHg, mean 5 mmHg.  Right ventricular  pressure was 34/7 mmHg.  Pulmonary artery pressure was 31/11 mmHg with a  mean of 20 mmHg.  Pulmonary capillary wedge pressure was A-wave 16 mmHg,  V-wave 17 mmHg, mean 13 mmHg.  Systemic arterial pressure was 99/57 with  a mean of 77 mmHg.  Left ventricular pressure was 147/16 mmHg.  The left  ventricular end-diastolic pressure pre ventriculogram was 16 mmHg.   ANGIOGRAPHY:  The left ventriculogram demonstrated normal chamber size  and normal global systolic function.  This was through both RAO and LAO  angulations.  There is heavy aortic valve calcification and there is  mitral annular calcification.  There is 2+ mitral vegetation seen really  only on the RAO angiogram.  I could not clearly visualize any mitral  vegetation on the LAO angiogram.  A visual estimate of the ejection  fraction is 70%.  Left coronary calcification is seen.   There is a right-dominant coronary system present.  The main left  coronary artery is without any significant obstruction.   The left anterior descending artery and its branches are also without  any significant obstruction.  There is an ostial 30% narrowing seen in  the LAO cranial view.  There are luminal irregularities throughout the  anterior descending artery.  There is a large first marginal branch  which arises proximally and is without obstruction as well.  The ongoing  anterior descending artery reaches and traverses the apex.   The left circumflex coronary artery and its branches are highly  diseased; the vessel contains a proximal tubular  80% stenosis.  It then  opens into a large marginal branch which is the dominant vessel on the  posterolateral wall of the heart and that marginal branch bifurcates.  In the proximal portion of the marginal branch there is a focal 30%  stenosis.   The right coronary artery and its branches are also without significant  obstruction but there are luminal irregularities throughout the proximal  and mid portion.  There is a large right ventricular branch that  provides flow to left ventricle via septal perforators in the mid to  distal portion of the septum.  The ongoing right coronary artery gives  rise to a small posterior descending artery and a small posterolateral  segment and left ventricular branch.  Collateral vessels are not seen.   FINAL IMPRESSION:  1. Atherosclerotic cardiovascular disease, single vessel.  2. Severe to critical aortic stenosis.  3. Moderate mitral regurgitation.  4. Intact left ventricular systolic function, ejection fraction 70%.  5. Normal right heart pressures, cardiac outputs.  6. Mildly-elevated left ventricular end-diastolic pressure.   PLAN/RECOMMENDATION:  The patient will still be recommended for valvular  surgery.  A transesophageal echocardiogram will be obtained prior to  referral to the surgeons to more fully assess the degree of  regurgitation as there does seem to be a disconnect between this study  and the echocardiogram.  It would also be required for preoperative  planning.      Eric [name], M.D.  Electronically Signed     JHE/MEDQ  D:  05/12/2007  T:  05/13/2007  Job:  [id number]

## 2010-10-03 NOTE — Assessment & Plan Note (Signed)
OFFICE VISIT   Eric [name], Eric [name]  DOB:  08/25/1929                                        July 15, 2007  CHART #:  [id number]   Eric [name] returns today for his first postoperative followup status  post coronary artery bypass graft surgery x1 with saphenous vein graft  to the obtuse marginal coronary artery, mitral valve repair, and aortic  valve replacement with a 23 mm Mitroflow pericardial valve, and ligation  of left atrial appendage.  He has been feeling fairly well overall, but  said his energy level is still not normal.  He is walking daily without  chest pain or shortness of breath.  He has been following up with Dr.  Marlowe Aschoff, who is following his coagulation profile.  The patient had  been on Coumadin preop for recurrent DVT and pulmonary embolism.   PHYSICAL EXAMINATION:  On physical examination today, his blood pressure  is 127/73 and his pulse is 102 with frequent premature beats.  Respiratory rate is 18 and unlabored.  Oxygen saturation on room air is  98%.  He looks well.  CARDIAC:  Exam shows a regular rate and rhythm with frequent premature  beats.  His aortic valve prosthesis sounds normal.  There is no murmur.  LUNGS:  Clear.  The chest incision is healing well and the sternum is  stable.  His leg incision is healing well and there is no lower  extremity edema.   Followup chest x-ray shows clear lung fields.  The postoperative  effusion and atelectasis have resolved.   MEDICATIONS:  1. Coumadin 5 mg daily.  2. Zetia 10 mg daily.  3. Lipitor 40 mg daily.  4. Toprol XL 25 mg daily.  5. Aspirin 81 mg daily.  6. Tussionex cough suppressant p.r.n.   Overall, Eric [name] is recovering well following his surgery.  I  encouraged him to continue walking as much as possible.  He is planning  on participating in cardiac rehab.  I told him I would expect it to take  three months for his energy level to return to normal levels.  I  told  him he could return to driving a car at this time, but should refrain  from lifting anything heavier than 10 pounds for a total of three months  from date of surgery.  He will continue to follow up with Dr. Marlowe Aschoff and Dr. Ty Hilts' office will follow his coagulation profile.  He  will be chronically anticoagulated for his history of deep vein  thrombosis and pulmonary embolism.  He will return to see me if he  develops any problems with his incisions.   Evelene Croon, M.D.  Electronically Signed   BB/MEDQ  D:  07/15/2007  T:  07/15/2007  Job:  [id number]   cc:   Francisca December, M.D.  Thora Lance, M.D.

## 2010-10-03 NOTE — Consult Note (Signed)
NEW PATIENT CONSULTATION   ARSLAN, KIER  DOB:  11-Aug-1929                                        June 14, 2007  CHART #:  [id number]   REASON FOR CONSULTATION:  Severe aortic stenosis and severe mitral  regurgitation and single vessel coronary disease.   CLINICAL HISTORY:  I was asked by Dr. Amil Amen to evaluate Mr. Merriott for  consideration of aortic valve replacement, mitral valve repair and  coronary artery bypass surgery.  He is a 77 year old gentleman who was  originally referred to Dr. Amil Amen in May 2008, for further evaluation  of his aortic valve.  He had a remote history of aortic sclerosis and  mild stenosis on echocardiogram  done for a heart murmur evaluation in  2003.  At that time, valve area was 1.7 cm2  with a velocity across the  valve of 2.3 meters per second.  The patient apparently had some right-  sided sharp chest pains and was felt to have a louder murmur and repeat  2-D echocardiogram  done in May 2008.  The echocardiogram  showed normal  ejection fraction with EF of 70% with mild hypertrophy.  There was  moderate to severe aortic stenosis with an aortic valve area of 1.1 cm2  and a transvalvular velocity of 3.1 meters per second.  There was mild  mitral annular calcification and mild posterior leaflet prolapse of the  mitral valve with moderate anteriorly directed mitral regurgitation.  Decision was made to continue following him and he subsequently has an  echocardiogram  performed in December 2008.  This showed worsening of  his aortic stenosis with a transaortic velocity of 4 meters per second  and a peak gradient of 64 with a mean gradient of 30 and a valve area  that had decreased to 0.9 cm2.  Ejection fraction was 65-70%.  The  mitral valve demonstrated partial flailed posterior leaflet with severe  mitral regurgitation and systolic flow reversal in the pulmonary veins.  He remained asymptomatic but given the above findings,  was referred for  surgical treatment.   ALLERGIES:  NONE.   MEDICATIONS:  Coumadin, Zetia, Lipitor 40 mg daily.   REVIEW OF SYSTEMS:  GENERAL:  He denies any fever or chills.  He has had  no recent weight changes.  He denies fatigue.  EYES:  Negative.  ENT:  Negative.  He does see a dentist regularly.  ENDOCRINE:  He denies  diabetes and hypothyroidism.  CARDIOVASCULAR:  He denies any chest pain  or pressure.  He denies shortness of breath, although he said that if he  went up and down stairs several times, he would probably be shortness of  breath.  He denies palpitations.  He has had no PND or orthopnea.  He  has had no peripheral edema.  RESPIRATORY:  He denies cough and sputum  production.  GI:  He has had no nausea or vomiting.  Denies melena and  bright red blood per rectum.  GU:  He denies dysuria and hematuria.  He  is status post prostatectomy for prostate cancer.  VASCULAR:  He had a  DVT after his prostate surgery and has been on Coumadin since that time.  He had a pulmonary embolism on one side and reports having a subsequent  scan which showed bilateral pulmonary emboli.  He  was told that he  needed to stay on Coumadin for the rest of his life for this.  NEUROLOGIC:  He denies any focal weakness or numbness.  He denies  dizziness and syncope.  He has never had a TIA or a stroke.  MUSCULOSKELETAL:  He denies any arthralgias or myalgias.  PSYCHIATRIC:  Negative.  HEMATOLOGIC:  Negative.   PAST MEDICAL HISTORY:  Significant for  1. Hypercholesterolemia.  2. He has a history of adenocarcinoma of the prostate, Gleason stage      6, status post radical retropubic prostatectomy in June 2000.  3. History of postoperative DVT and pulmonary embolism and is now on      lifelong Coumadin.  4. History of remote left-sided melanoma removed in the past.  5. History of worsening aortic stenosis and mitral regurgitation as      noted above.  6. He is status post eye surgery as a  child.  7. He is status post left  rotator cuff surgery in May 2000.  8. He is status post left eye cataract.   SOCIAL HISTORY:  He is retired from Airline pilot at [employer].  He is married and  lives with his wife.  He has two children.  He has never smoked and  drinks alcohol about three to four times per week.   FAMILY HISTORY:  Negative for cardiac disease.   PHYSICAL EXAMINATION:  VITAL SIGNS:  His blood pressure is 129/74, pulse  88 and regular, respiratory rate 18 and unlabored, oxygen saturation on  room air is 96%.  GENERAL APPEARANCE:  He is a well-developed white male in no distress.  HEENT:  Normocephalic and atraumatic.  Pupils are equal and reactive to  light and accommodation.  Extraocular muscles are intact.  Throat is  clear.  Teeth are in good condition.  NECK:  Normal carotid pulses bilaterally.  There is a transmitted murmur  to both sides of his neck.  There is no adenopathy or thyromegaly.  LUNGS:  Clear.  CARDIOVASCULAR:  Regular rate and rhythm with a grade 3/6 systolic  murmur over the aorta.  There is also a grade 3/6 holosystolic murmur at  the apex radiating into the axilla consistent with mitral regurgitation.  ABDOMEN:  Active bowel sounds.  His abdomen is soft and nontender.  There are no palpable masses or organomegaly.  EXTREMITIES:  No peripheral edema.  Pedal pulses are palpable  bilaterally.  Skin is warm and dry.  NEUROLOGIC:  He is alert and oriented x3.  Motor and sensory exams  grossly normal.   Review of his cardiac catheterization  performed on May 12, 2007,  shows normal left ventricular systolic function with 2+ mitral  regurgitation seen mainly in the RAO projection.  The peak to peak  gradient across the aortic valve was 48 with a mean gradient of 33.  Valve area was calculated at 0.95 cm2 by Fick and 0.88 cm2 by  thermodilution.  Cardiac index was 2.6.  Coronary angiography showed  about 30% ostial narrowing of the LAD.  The left circumflex  showed 80%  tubular proximal stenosis before a large marginal branch.  The right  coronary artery had no significant disease in it.   Review of his transesophageal echocardiogram showed a flail segment of  the middle scallop of the posterior mitral leaflet with evidence of  ruptured chordae.  There is severe aortic stenosis and mild aortic  regurgitation as well as severe mitral regurgitation.  There was no  atheroma  of the aortic arch.   IMPRESSION:  Mr. Phillippi has significant single vessel coronary disease  as well as severe aortic stenosis and severe mitral regurgitation  secondary to flail segment of the posterior mitral leaflet.  He is  essentially asymptomatic but I agree that given his pathology, it is  best to proceed with surgical repair before he develops left ventricular  dysfunction.  This will require aortic valve replacement as well as  mitral valve repair and coronary artery bypass to the obtuse marginal  branch.  He does have 30% narrowing of the ostium of his LAD but I think  if this vessel was grafted with a left internal mammary graft, it would  likely become atretic due to competitive flow.  I discussed the  operative procedure with he and his wife.  We discussed the alternatives  for aortic valve replacement including mechanical and tissue valves.  We  discussed the pros and cons of both.  He is on lifelong anticoagulation  with Coumadin so a mechanical valve would be a reasonable choice even  given his age of 58 years.  A tissue valve would also be a reasonable  choice given his age and would allow him to get off Coumadin if the  necessity arose as he gets older.  I told them that I would think about  what the best choice would be and they said they would leave that option  up to me.  I discussed the risks and benefits of surgery including, but  not limited to, bleeding, blood transfusion, infection, stroke,  myocardial infarction, graft failure and death.  I also  discussed the  possibility of heart block requiring permanent  pacemaker.  They understand, would like to proceed and will call our  office to schedule surgery sometime next week.   Evelene Croon, M.D.  Electronically Signed   BB/MEDQ  D:  06/14/2007  T:  06/15/2007  Job:  [id number]   cc:   Thora Lance, M.D.

## 2010-10-03 NOTE — Discharge Summary (Signed)
NAME:  Eric [name], Eric [name] NO.:  [id number]   MEDICAL RECORD NO.:  [id number]          PATIENT TYPE:  INP   LOCATION:  2018                         FACILITY:  [facility]   PHYSICIAN:  Evelene Croon, M.D.     DATE OF BIRTH:  Nov 02, 1929   DATE OF ADMISSION:  06/19/2007  DATE OF DISCHARGE:  06/24/2007                               DISCHARGE SUMMARY   HISTORY OF PRESENT ILLNESS:  The patient is a 77 year old white male  referred to Dr. Evelene Croon for consideration of an aortic valve  replacement, mitral valve repair and coronary artery bypass graft  surgery.  He is a 77 year old gentleman who was originally referred to  Dr. Francisca December in May 2008, for further evaluation of his aortic  valve.  He has a remote history of aortic sclerosis and mild stenosis on  echocardiogram, known for a heart murmur in the year 2003.  At that time  the valve area was 1.7 sq/cm with a velocity across the valve of 2.3  meters per second.  The patient apparently had some right-sided sharp  chest pain and was felt to have a louder murmur and a repeat 2-D  echocardiogram was done in May 2008.  This study revealed normal  ejection fraction with an ejection fraction of 70% with mild ventricular  hypertrophy.  There was moderate to severe aortic stenosis with an  aortic valve area of 1.1 sq/cm and a transvalvular velocity of 3.1  meters per second.  There was mild mitral annular calcification and mild  posterior leaflet prolapse on the mitral valve with moderate anterior  directional mitral regurgitation.  A decision was made to continue to  follow him and he subsequently had a repeat echocardiogram done in  December 2008.  This revealed worsening of his aortic stenosis with a  transaortic velocity of 4 meters per second and a peak gradient of 64  with a mean gradient of 30 and the valve area had decreased further to  0.9 sq/cm.  The ejection fraction remained 65%-70%.  The mitral valve  now  revealed a partial filled posterior leaflet with severe mitral  regurgitation and systolic flow reversal in the pulmonary veins.  He  remained asymptomatic, but given the above findings, he was referred for  surgical treatment.  The patient was referred to Dr. Laneta Simmers for surgical  consideration.   A cardiac catheterization was performed on May 12, 2007, and this  revealed normal left ventricular systolic function with 2+ mitral  regurgitation, seen mainly in the RAO projection.  The peak to peak  gradient across the aortic valve was 48, with a mean gradient of 33.  The valve area was calculated at 0.95 sq/cm by Fick and 0.88 sq/cm by  thermodilution.  The cardiac index was measured at 2.6.  The coronary  angiogram showed a 30% ostial narrowing of the LAD.  The left circumflex  had an 80% tubular proximal stenosis before a large marginal branch.  The right coronary artery had no significant disease.  It was Dr.  Sharee Pimple opinion that the patient should undergo an aortic  valve  replacement with a mitral valve repair and coronary artery bypass graft  surgery x1 for that circumflex artery.  He was admitted this  hospitalization for the procedure.   ALLERGIES:  No known drug allergies.   MEDICATIONS ON ADMISSION:  1. Coumadin 2.5 and 5 mg, alternating daily doses.  2. Zetia 10 mg daily.  3. Lipitor 40 mg daily.   PAST MEDICAL HISTORY:  1. Hypercholesterolemia.  2. History of adenocarcinoma of the prostate Gleason stage VI, status      post retropubic prostatectomy in June 2000.  3. History of postoperative deep venous thrombosis with pulmonary      embolism, and he is now on life-long Coumadin.  4. History of left-sided melanoma, removed in the past.  5. History of worsening aortic stenosis and mitral regurgitation, as      described above.  6. History of eye surgery as a child.  7. History of left rotator cuff surgery in May 2000.  8. History of left cataract surgery.    FAMILY HISTORY/SOCIAL HISTORY/REVIEW OF SYSTEMS/PHYSICAL EXAM:  Please  see the history and physical done at the time of admission.   HOSPITAL COURSE:  The patient was admitted electively and on June 19, 2007, he was taken to the operating room, at which time he underwent the  following procedure:  1. Coronary artery bypass graft surgery x1 with a saphenous vein graft      to the OM coronary artery.  2. Mitral valve repair with a #28 Sorin 3-D Memo ring annuloplasty      with repair of ruptured chordae of P-2 flail segments.  3. He also underwent an aortic valve replacement with a #23 mitral      flow pericardial valve.  4. Ligation of a left atrial appendage.  The procedures were performed      by Dr. Laneta Simmers.  He tolerated them well and was taken to the      surgical intensive care unit in stable condition.   POSTOPERATIVE HOSPITAL COURSE:  The patient has done remarkably well.  He has remained stable hemodynamically.  All routine lines, monitors,  drains and devices were discontinued in the standard fashion.  He was  weaned from the ventilator without difficulty.  He did have some early  sinus bradycardia, but this has resolved to a normal sinus rhythm.  He  has been started back on Coumadin.  He does have a mild postoperative  anemia, but the values have stabilized.  The most recent hemoglobin and  hematocrit dated June 22, 2007, 11.5 and 34.6 respectively.  Electrolytes, BUN and creatinine are within normal limits.  The  incisions are healing well without evidence of infection.  He has  tolerated a gentle diuresis without difficulty.  He has been weaned from  oxygen and maintains adequate saturations on room air.  He is tolerating  routine advancement and activities commensurate for level of  postoperative convalescence using the standard protocols.  He did have a  short term episode of diarrhea, but this has resolved.  Overall his  status is felt to be quite stable for a  tentative discharge in the  morning on June 24, 2007, pending morning rounds for re-evaluation.   CURRENT MEDICATIONS ON DISCHARGE:  1. Aspirin 81 mg daily.  2. Toprol XL 25 mg daily.  3. Lipitor 40 mg daily.  4. Coumadin, dosage to be determined at the time of discharge.  5. Zetia 10 mg daily.  6. Oxycodone for pain  5 mg, one to two q.4-6h. p.r.n. pain.   INSTRUCTIONS:  The patient received written instructions regarding  medications, activity, diet, wound care and followup.   FOLLOWUP:  1. To follow up with Dr. Amil Amen in two weeks.  2. To follow up with Dr. Laneta Simmers on July 15, 2007.  3. He is instructed to follow up for his PT and INR at Dr. Amil Amen'      office on June 26, 2007.   CONDITION ON DISCHARGE:  Stable/improving.   DISCHARGE DIAGNOSIS:  Severe aortic stenosis, now status post aortic  valve replacement.   SECONDARY DIAGNOSES:  1. Mitral regurgitation, status post mitral valve repair.  2. Coronary artery disease, status post coronary artery bypass graft      surgery x1.  3. Hypercholesterolemia.  4. History of Gleason stage VI adenocarcinoma of the prostate, status      post retropubic prostatectomy.  5. History of deep venous thrombosis and pulmonary embolism, on life-      long Coumadin therapy.  6. History of melanoma.  7. History of eye surgery as a child.  8. History of cataract surgery, left eye.  9. History of left rotator cuff surgery.      Rowe Clack, P.A.-C.      Evelene Croon, M.D.  Electronically Signed    WEG/MEDQ  D:  06/23/2007  T:  06/23/2007  Job:  [id number]   cc:   Francisca December, M.D.  Thora Lance, M.D.

## 2010-10-06 NOTE — H&P (Signed)
NAME:  Eric [name], Eric [name]                         ACCOUNT NO.:  [id number]   MEDICAL RECORD NO.:  [id number]                   PATIENT TYPE:  INP   LOCATION:  3731                                 FACILITY:  [facility]   PHYSICIAN:  Candyce Churn, M.D.          DATE OF BIRTH:  Jul 10, 1929   DATE OF ADMISSION:  05/29/2003  DATE OF DISCHARGE:                                HISTORY & PHYSICAL   CHIEF COMPLAINT:  Chest pain with shortness of breath and history of  pulmonary emboli.   HISTORY OF PRESENT ILLNESS:  Eric [name] is a very pleasant 77 year old male  with a history of bilateral pulmonary emboli in the year 2000 following  radical prostatectomy.  He has noticed both left and right chest pains with  shortness of breath with exertion over the past one to two weeks and thought  that his Lipitor was the culprit.  Today right pleuritic side pain felt  like the pulmonary embolus I had before.  He came to the [facility] Emergency Room and chest CT was consistent with bilateral lower  lobe pulmonary emboli.  He denies prolonged immobilization, lower extremity  pain, or swelling.  He takes a baby aspirin daily.  He is admitted for IV  heparin and Coumadin therapy and to monitor.   ALLERGIES:  No known allergies to drugs.   MEDICATIONS:  Aspirin 81 mg a day.   PAST MEDICAL HISTORY:  1. Prostate cancer with radical prostatectomy in June of 2000.  Surgery     performed in [place], [place], by Dr. Irwin Brakeman.  He is followed locally     by [facility], M.D.  2. Aortic stenosis, mild.  3. Hypercholesterolemia.  4. Right lower extremity DVT and bilateral PE in 2000 as above.   PAST SURGICAL HISTORY:  1. Rotator cuff repair on the left.  2. Radical prostatectomy.   FAMILY HISTORY:  His father had hairy cell leukemia and died of pneumonia at  the age of 77.  His mother died at age 71.  She had dementia and died a  sudden death.  He has a sister with no medical problems.  No  known clotting  disorders in the family.   SOCIAL HISTORY:  The patient is retired and married.  He moved from [place] eight years ago.  His wife is supportive.  He plays golf and walks  regularly.   HABITS:  No tobacco.  One glass of wine four to five times a week.   REVIEW OF SYSTEMS:  As per HPI.  The patient had a cardiac workup in June of  2000 when he developed a chest pain syndrome in some of the high altitudes  in [place].  He had a negative stress test.  On echocardiogram, he was  apparently found to have mild aortic stenosis.  He has not had dyspnea on  exertion until the past couple of weeks.  PHYSICAL EXAMINATION:  GENERAL APPEARANCE:  A well-developed, well-nourished  male in no acute distress.  He appears younger than stated age.  He is  complaining of mild right pleuritic chest pain.  VITAL SIGNS:  Blood pressure 134/85, temperature 98.7 degrees, pulse 97 and  regular, respiratory rate 22 and easy, and O2 saturation 96% on room air.  HEENT:  Normocephalic and atraumatic.  There may be some mild strabismus in  the left eye.  The oropharynx is clear.  NECK:  Supple without JVD or bruits.  CHEST:  Clear to auscultation.  He does have pain in the right lateral chest  with inspiration.  CARDIAC:  Regular rhythm with a 2/6 systolic ejection murmur over the left  lower sternal border which radiates into the right second intercostal space.  ABDOMEN:  Soft.  Bowel sounds normal.  Nontender.  No obvious organomegaly.  EXTREMITIES:  No edema.  There are no painful cords.  He has negative Homans  sign bilaterally.  There is no pain to palpation in the thighs or calves  bilaterally.  NEUROLOGIC:  He is oriented x 3.  Nonfocal exam.  He is alert.  Cranial  nerves intact.  SKIN:  Without rashes.   LABORATORY DATA:  Chest CT reveals bilateral left lower lobe pulmonary  emboli.  The EKG is pending.  Hypercoagulability workup is pending.  This  will include protein C, protein  S, anticardiolipin antibody, and factor V  Leiden mutation.   The sodium is 138, potassium 4.0, chloride 106, bicarbonate 25, BUN 12,  creatinine 1.1, and glucose 84.   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   PLAN:  1. Start IV heparin and Coumadin and also p.o. Coumadin therapy.  2. Will monitor overnight.  3. Discontinue aspirin.  4. Send hypercoagulability workup, including protein C and protein S titers,     as well as factor V Leiden mutation assay and anticardiolipin antibody.  5. Consider Zetia for cholesterol therapy, though the symptoms that he     thought were from Lipitor were likely from pulmonary emboli.                                                Candyce Churn, M.D.    RNG/MEDQ  D:  05/29/2003  T:  05/29/2003  Job:  [id number]

## 2010-11-07 ENCOUNTER — Other Ambulatory Visit: Payer: Self-pay | Admitting: Dermatology

## 2011-02-08 LAB — POCT I-STAT 3, ART BLOOD GAS (G3+)
Acid-base deficit: 3 — ABNORMAL HIGH
Acid-base deficit: 4 — ABNORMAL HIGH
Bicarbonate: 19 — ABNORMAL LOW
Bicarbonate: 21.6
O2 Saturation: 100
O2 Saturation: 100
O2 Saturation: 98
Operator id: 302581
Operator id: 302581
Operator id: 3406
Patient temperature: 35.5
Patient temperature: 38.3
TCO2: 20
TCO2: 27
pH, Arterial: 7.334 — ABNORMAL LOW
pO2, Arterial: 179 — ABNORMAL HIGH
pO2, Arterial: 260 — ABNORMAL HIGH
pO2, Arterial: 97

## 2011-02-08 LAB — POCT I-STAT 4, (NA,K, GLUC, HGB,HCT)
Glucose, Bld: 101 — ABNORMAL HIGH
Glucose, Bld: 119 — ABNORMAL HIGH
Glucose, Bld: 96
Glucose, Bld: 97
Glucose, Bld: 99
HCT: 25 — ABNORMAL LOW
HCT: 26 — ABNORMAL LOW
HCT: 36 — ABNORMAL LOW
HCT: 38 — ABNORMAL LOW
Hemoglobin: 11.2 — ABNORMAL LOW
Hemoglobin: 12.2 — ABNORMAL LOW
Hemoglobin: 12.9 — ABNORMAL LOW
Hemoglobin: 7.8 — CL
Hemoglobin: 8.5 — ABNORMAL LOW
Operator id: 3406
Operator id: 3406
Operator id: 3406
Operator id: 3406
Operator id: 3406
Potassium: 4.2
Potassium: 4.2
Potassium: 4.2
Potassium: 5
Potassium: 6.6
Sodium: 133 — ABNORMAL LOW
Sodium: 137
Sodium: 138
Sodium: 139

## 2011-02-08 LAB — I-STAT EC8
Acid-base deficit: 5 — ABNORMAL HIGH
BUN: 15
BUN: 18
Bicarbonate: 19.5 — ABNORMAL LOW
Glucose, Bld: 140 — ABNORMAL HIGH
HCT: 36 — ABNORMAL LOW
Hemoglobin: 11.6 — ABNORMAL LOW
Hemoglobin: 12.2 — ABNORMAL LOW
Operator id: 302581
Potassium: 3.6
Potassium: 4.2
Sodium: 139
Sodium: 141
TCO2: 21
pCO2 arterial: 35.2
pH, Arterial: 7.446

## 2011-02-08 LAB — TYPE AND SCREEN
ABO/RH(D): A POS
Antibody Screen: NEGATIVE

## 2011-02-08 LAB — CBC
HCT: 32.5 — ABNORMAL LOW
HCT: 34.4 — ABNORMAL LOW
HCT: 35.6 — ABNORMAL LOW
HCT: 36.2 — ABNORMAL LOW
HCT: 37.3 — ABNORMAL LOW
HCT: 43.2
Hemoglobin: 11.3 — ABNORMAL LOW
Hemoglobin: 11.8 — ABNORMAL LOW
Hemoglobin: 14.8
MCHC: 34.2
MCHC: 34.3
MCHC: 34.3
MCHC: 34.5
MCHC: 34.6
MCHC: 34.6
MCV: 88.6
MCV: 88.7
MCV: 89
MCV: 89.2
Platelets: 102 — ABNORMAL LOW
Platelets: 116 — ABNORMAL LOW
Platelets: 129 — ABNORMAL LOW
Platelets: 134 — ABNORMAL LOW
Platelets: 176
Platelets: 78 — ABNORMAL LOW
RBC: 3.66 — ABNORMAL LOW
RBC: 4.05 — ABNORMAL LOW
RBC: 4.86
RDW: 12.3
RDW: 12.8
RDW: 12.8
RDW: 12.9
WBC: 13.4 — ABNORMAL HIGH
WBC: 13.6 — ABNORMAL HIGH
WBC: 5.9

## 2011-02-08 LAB — POCT I-STAT GLUCOSE
Glucose, Bld: 109 — ABNORMAL HIGH
Glucose, Bld: 126 — ABNORMAL HIGH
Operator id: 3406

## 2011-02-08 LAB — COMPREHENSIVE METABOLIC PANEL WITH GFR
ALT: 23
AST: 23
Albumin: 3.7
Alkaline Phosphatase: 44
BUN: 16
CO2: 23
Calcium: 8.8
Chloride: 106
Creatinine, Ser: 0.97
GFR calc Af Amer: >60
GFR calc non Af Amer: >60
Glucose, Bld: 84
Potassium: 4.1
Sodium: 135
Total Bilirubin: 2.5 — ABNORMAL HIGH
Total Protein: 6.6

## 2011-02-08 LAB — BASIC METABOLIC PANEL
BUN: 13
CO2: 21
CO2: 23
Calcium: 8 — ABNORMAL LOW
Chloride: 112
GFR calc Af Amer: >60
GFR calc non Af Amer: >60
Potassium: 3.5
Potassium: 3.8
Sodium: 138

## 2011-02-08 LAB — BLOOD GAS, ARTERIAL
Acid-Base Excess: 1.6
Bicarbonate: 25.3 — ABNORMAL HIGH
Drawn by: 274481
FIO2: 0.21
O2 Saturation: 98
TCO2: 26.5
pO2, Arterial: 96.6

## 2011-02-08 LAB — URINALYSIS, ROUTINE W REFLEX MICROSCOPIC
Bilirubin Urine: NEGATIVE
Hgb urine dipstick: NEGATIVE
Protein, ur: NEGATIVE
Specific Gravity, Urine: 1.024
Urobilinogen, UA: 1

## 2011-02-08 LAB — PROTIME-INR
INR: 1.3
INR: 1.6 — ABNORMAL HIGH
Prothrombin Time: 16.5 — ABNORMAL HIGH

## 2011-02-08 LAB — CREATININE, SERUM
Creatinine, Ser: 0.85
Creatinine, Ser: 0.88
GFR calc Af Amer: >60

## 2011-02-08 LAB — PREPARE PLATELET PHERESIS

## 2011-02-08 LAB — MAGNESIUM: Magnesium: 2.8 — ABNORMAL HIGH

## 2011-02-08 LAB — APTT: aPTT: 40 — ABNORMAL HIGH

## 2011-02-08 LAB — PLATELET COUNT: Platelets: 99 — ABNORMAL LOW

## 2011-02-08 LAB — HEMOGLOBIN A1C
Hgb A1c MFr Bld: 5.4
Mean Plasma Glucose: 115

## 2011-02-09 LAB — BASIC METABOLIC PANEL
BUN: 14
CO2: 25
CO2: 25
Calcium: 8.2 — ABNORMAL LOW
Calcium: 8.2 — ABNORMAL LOW
Chloride: 104
Chloride: 107
GFR calc Af Amer: 12 — ABNORMAL LOW
GFR calc Af Amer: >60
Glucose, Bld: 159 — ABNORMAL HIGH
Glucose, Bld: 96
Potassium: 4.1
Sodium: 137
Sodium: 140

## 2011-02-09 LAB — CBC
HCT: 34.6 — ABNORMAL LOW
Hemoglobin: 11.5 — ABNORMAL LOW
MCV: 89.7
Platelets: 196
RBC: 3.86 — ABNORMAL LOW
RDW: 16.4 — ABNORMAL HIGH

## 2011-02-09 LAB — CREATININE, SERUM
Creatinine, Ser: 0.87
GFR calc Af Amer: >60
GFR calc non Af Amer: >60

## 2011-02-09 LAB — PROTIME-INR
INR: 1.7 — ABNORMAL HIGH
Prothrombin Time: 14.7
Prothrombin Time: 25.6 — ABNORMAL HIGH

## 2011-02-09 LAB — BUN: BUN: 16

## 2011-02-23 LAB — POCT I-STAT 3, VENOUS BLOOD GAS (G3P V)
Acid-Base Excess: 2
Bicarbonate: 25.8 — ABNORMAL HIGH
Bicarbonate: 27.1 — ABNORMAL HIGH
O2 Saturation: 73
Operator id: 274862
Operator id: 274862
Operator id: 274862
pCO2, Ven: 37.9 — ABNORMAL LOW
pCO2, Ven: 41 — ABNORMAL LOW
pH, Ven: 7.428 — ABNORMAL HIGH
pH, Ven: 7.442 — ABNORMAL HIGH
pO2, Ven: 36
pO2, Ven: 42

## 2011-02-23 LAB — POCT I-STAT 3, ART BLOOD GAS (G3+)
Bicarbonate: 25 — ABNORMAL HIGH
Operator id: 274862
pCO2 arterial: 40.6
pH, Arterial: 7.398
pO2, Arterial: 67 — ABNORMAL LOW

## 2011-02-23 LAB — PROTIME-INR
INR: 1.1
Prothrombin Time: 14.1

## 2011-05-22 DIAGNOSIS — E049 Nontoxic goiter, unspecified: Secondary | ICD-10-CM

## 2011-05-22 HISTORY — DX: Nontoxic goiter, unspecified: E04.9

## 2011-05-24 DIAGNOSIS — Z7901 Long term (current) use of anticoagulants: Secondary | ICD-10-CM | POA: Diagnosis not present

## 2011-05-24 DIAGNOSIS — I2699 Other pulmonary embolism without acute cor pulmonale: Secondary | ICD-10-CM | POA: Diagnosis not present

## 2011-06-07 DIAGNOSIS — E7800 Pure hypercholesterolemia, unspecified: Secondary | ICD-10-CM | POA: Diagnosis not present

## 2011-06-07 DIAGNOSIS — Z0000 Encounter for general adult medical examination without abnormal findings: Secondary | ICD-10-CM | POA: Diagnosis not present

## 2011-06-07 DIAGNOSIS — C61 Malignant neoplasm of prostate: Secondary | ICD-10-CM | POA: Diagnosis not present

## 2011-06-07 DIAGNOSIS — Z1331 Encounter for screening for depression: Secondary | ICD-10-CM | POA: Diagnosis not present

## 2011-06-07 DIAGNOSIS — I359 Nonrheumatic aortic valve disorder, unspecified: Secondary | ICD-10-CM | POA: Diagnosis not present

## 2011-06-07 DIAGNOSIS — I2510 Atherosclerotic heart disease of native coronary artery without angina pectoris: Secondary | ICD-10-CM | POA: Diagnosis not present

## 2011-06-12 DIAGNOSIS — I2699 Other pulmonary embolism without acute cor pulmonale: Secondary | ICD-10-CM | POA: Diagnosis not present

## 2011-06-12 DIAGNOSIS — Z7901 Long term (current) use of anticoagulants: Secondary | ICD-10-CM | POA: Diagnosis not present

## 2011-07-10 DIAGNOSIS — L570 Actinic keratosis: Secondary | ICD-10-CM | POA: Diagnosis not present

## 2011-07-10 DIAGNOSIS — I2699 Other pulmonary embolism without acute cor pulmonale: Secondary | ICD-10-CM | POA: Diagnosis not present

## 2011-07-10 DIAGNOSIS — Z85820 Personal history of malignant melanoma of skin: Secondary | ICD-10-CM | POA: Diagnosis not present

## 2011-07-10 DIAGNOSIS — D235 Other benign neoplasm of skin of trunk: Secondary | ICD-10-CM | POA: Diagnosis not present

## 2011-07-10 DIAGNOSIS — Z7901 Long term (current) use of anticoagulants: Secondary | ICD-10-CM | POA: Diagnosis not present

## 2011-07-10 DIAGNOSIS — Z85828 Personal history of other malignant neoplasm of skin: Secondary | ICD-10-CM | POA: Diagnosis not present

## 2011-07-31 DIAGNOSIS — H905 Unspecified sensorineural hearing loss: Secondary | ICD-10-CM | POA: Diagnosis not present

## 2011-07-31 DIAGNOSIS — H6120 Impacted cerumen, unspecified ear: Secondary | ICD-10-CM | POA: Diagnosis not present

## 2011-07-31 DIAGNOSIS — H903 Sensorineural hearing loss, bilateral: Secondary | ICD-10-CM | POA: Diagnosis not present

## 2011-08-07 DIAGNOSIS — I2699 Other pulmonary embolism without acute cor pulmonale: Secondary | ICD-10-CM | POA: Diagnosis not present

## 2011-08-07 DIAGNOSIS — Z7901 Long term (current) use of anticoagulants: Secondary | ICD-10-CM | POA: Diagnosis not present

## 2011-09-11 DIAGNOSIS — Z7901 Long term (current) use of anticoagulants: Secondary | ICD-10-CM | POA: Diagnosis not present

## 2011-09-11 DIAGNOSIS — I2699 Other pulmonary embolism without acute cor pulmonale: Secondary | ICD-10-CM | POA: Diagnosis not present

## 2011-10-29 DIAGNOSIS — I2699 Other pulmonary embolism without acute cor pulmonale: Secondary | ICD-10-CM | POA: Diagnosis not present

## 2011-10-29 DIAGNOSIS — Z7901 Long term (current) use of anticoagulants: Secondary | ICD-10-CM | POA: Diagnosis not present

## 2011-12-06 DIAGNOSIS — H1130 Conjunctival hemorrhage, unspecified eye: Secondary | ICD-10-CM | POA: Diagnosis not present

## 2011-12-19 DIAGNOSIS — I2699 Other pulmonary embolism without acute cor pulmonale: Secondary | ICD-10-CM | POA: Diagnosis not present

## 2011-12-19 DIAGNOSIS — Z7901 Long term (current) use of anticoagulants: Secondary | ICD-10-CM | POA: Diagnosis not present

## 2012-01-14 DIAGNOSIS — Z954 Presence of other heart-valve replacement: Secondary | ICD-10-CM | POA: Diagnosis not present

## 2012-01-14 DIAGNOSIS — Z7901 Long term (current) use of anticoagulants: Secondary | ICD-10-CM | POA: Diagnosis not present

## 2012-02-08 DIAGNOSIS — Z7901 Long term (current) use of anticoagulants: Secondary | ICD-10-CM | POA: Diagnosis not present

## 2012-02-08 DIAGNOSIS — I2699 Other pulmonary embolism without acute cor pulmonale: Secondary | ICD-10-CM | POA: Diagnosis not present

## 2012-02-08 DIAGNOSIS — E785 Hyperlipidemia, unspecified: Secondary | ICD-10-CM | POA: Diagnosis not present

## 2012-02-08 DIAGNOSIS — I359 Nonrheumatic aortic valve disorder, unspecified: Secondary | ICD-10-CM | POA: Diagnosis not present

## 2012-02-08 DIAGNOSIS — I2510 Atherosclerotic heart disease of native coronary artery without angina pectoris: Secondary | ICD-10-CM | POA: Diagnosis not present

## 2012-02-12 DIAGNOSIS — L259 Unspecified contact dermatitis, unspecified cause: Secondary | ICD-10-CM | POA: Diagnosis not present

## 2012-02-12 DIAGNOSIS — Z85820 Personal history of malignant melanoma of skin: Secondary | ICD-10-CM | POA: Diagnosis not present

## 2012-02-28 DIAGNOSIS — I2699 Other pulmonary embolism without acute cor pulmonale: Secondary | ICD-10-CM | POA: Diagnosis not present

## 2012-02-28 DIAGNOSIS — Z7901 Long term (current) use of anticoagulants: Secondary | ICD-10-CM | POA: Diagnosis not present

## 2012-03-13 DIAGNOSIS — H3400 Transient retinal artery occlusion, unspecified eye: Secondary | ICD-10-CM | POA: Diagnosis not present

## 2012-03-14 ENCOUNTER — Other Ambulatory Visit: Payer: Self-pay | Admitting: Internal Medicine

## 2012-03-14 DIAGNOSIS — Z7901 Long term (current) use of anticoagulants: Secondary | ICD-10-CM | POA: Diagnosis not present

## 2012-03-14 DIAGNOSIS — H3400 Transient retinal artery occlusion, unspecified eye: Secondary | ICD-10-CM

## 2012-03-14 DIAGNOSIS — Z23 Encounter for immunization: Secondary | ICD-10-CM | POA: Diagnosis not present

## 2012-03-19 ENCOUNTER — Other Ambulatory Visit: Payer: Self-pay | Admitting: Internal Medicine

## 2012-03-19 ENCOUNTER — Ambulatory Visit
Admission: RE | Admit: 2012-03-19 | Discharge: 2012-03-19 | Disposition: A | Discharge status: Home / Self Care | Payer: Medicare Other | Source: Ambulatory Visit | Attending: Internal Medicine | Admitting: Internal Medicine

## 2012-03-19 DIAGNOSIS — E041 Nontoxic single thyroid nodule: Secondary | ICD-10-CM | POA: Diagnosis not present

## 2012-03-19 DIAGNOSIS — H3400 Transient retinal artery occlusion, unspecified eye: Secondary | ICD-10-CM

## 2012-03-19 IMAGING — US US CAROTID DUPLEX BILAT
1 series · 13 of 24 positions shown · non-contrast
Comparison: None available

CLINICAL DATA: Transient  left retinal arterial occlusion.
Coronary artery disease.

BILATERAL CAROTID DUPLEX ULTRASOUND
TECHNIQUE: Gray scale imaging, color Doppler and duplex ultrasound
was performed of bilateral carotid and vertebral arteries in the
neck.

[Series 1: us carotid duplex bilat · 0.11mm/px · 13 of 63 slices shown]
[im 1/63]
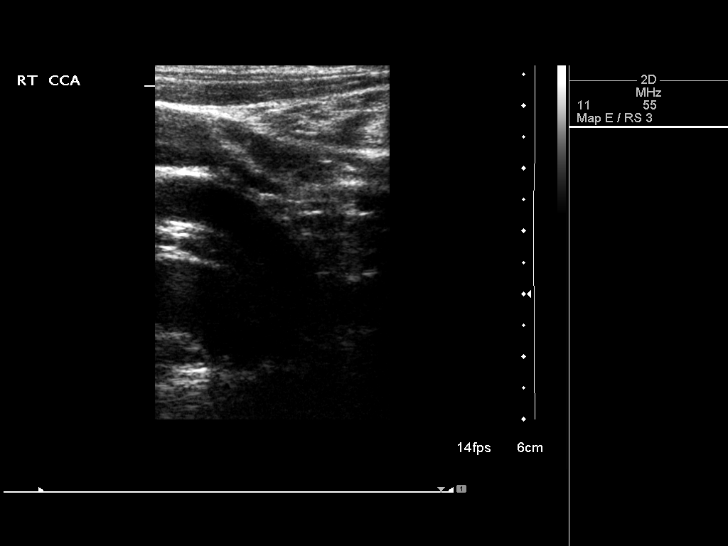
[im 6/63]
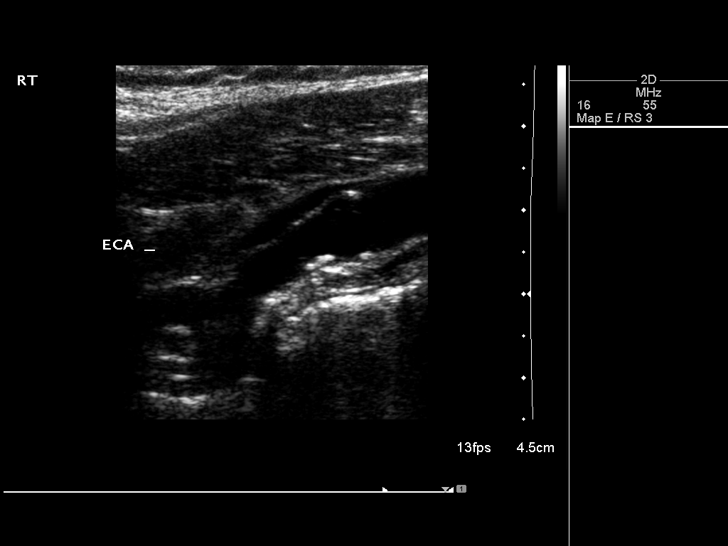
[im 11/63]
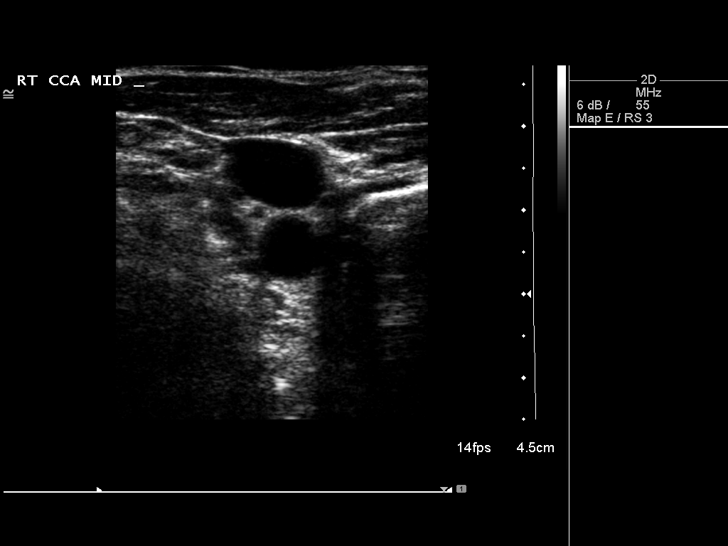
[im 17/63]
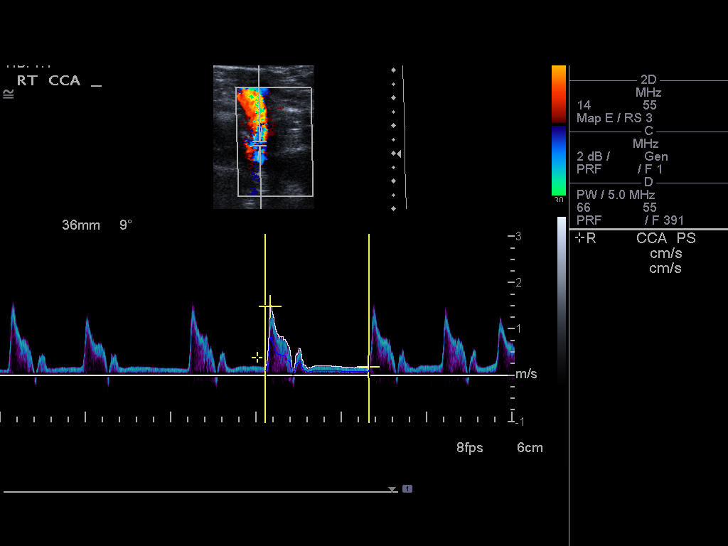
[im 22/63]
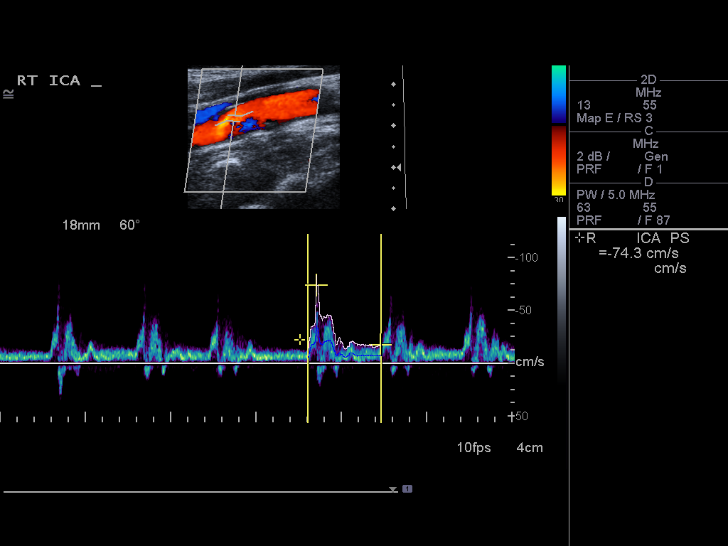
[im 27/63]
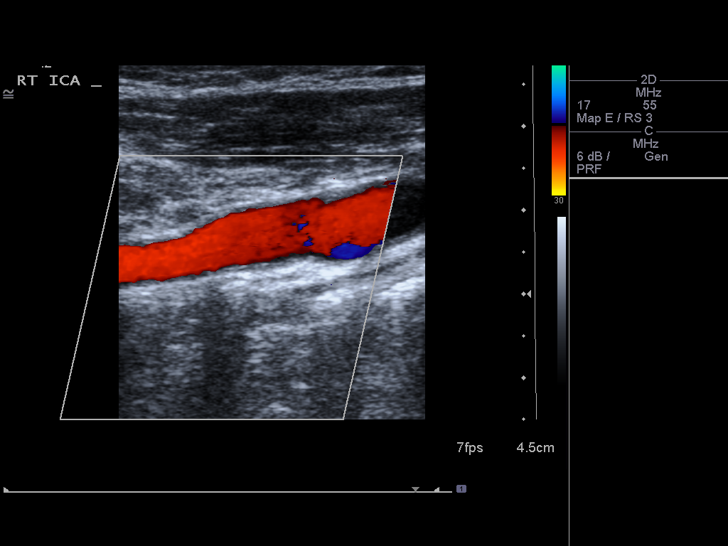
[im 33/63]
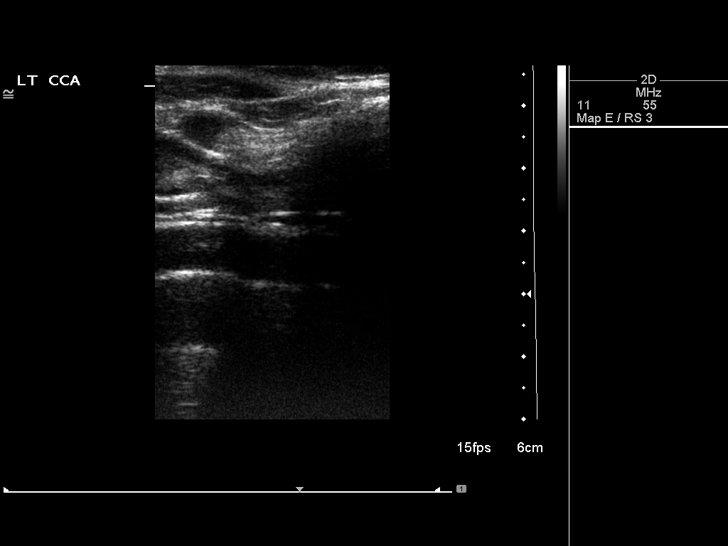
[im 36/63]
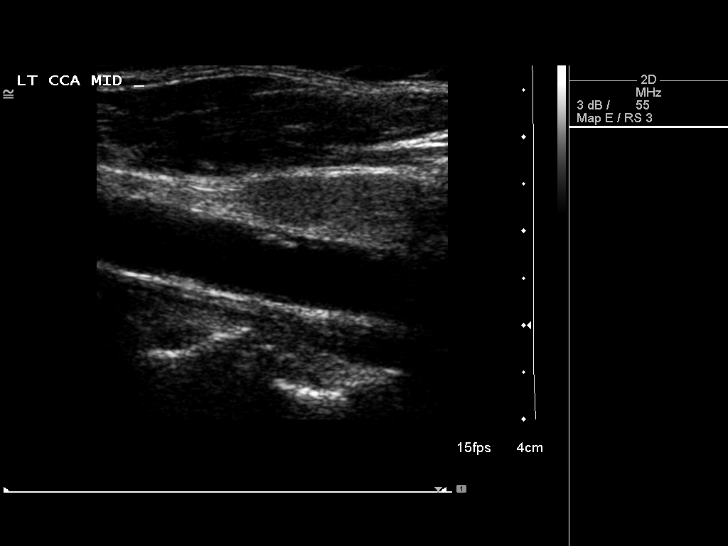
[im 41/63]
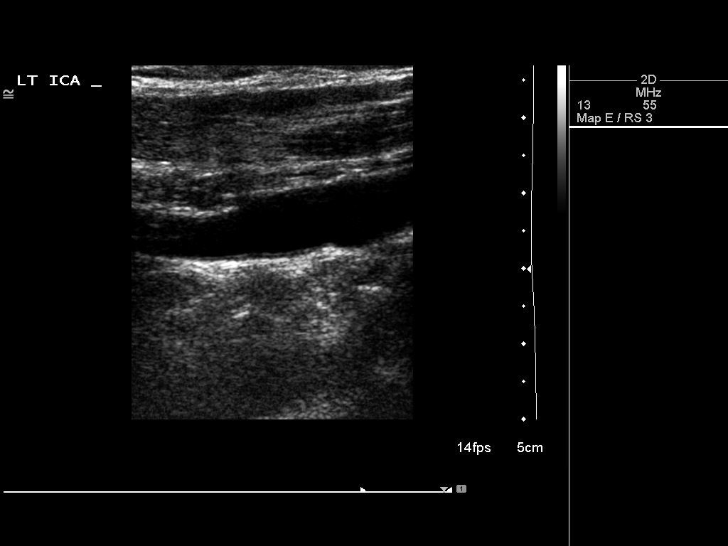
[im 46/63]
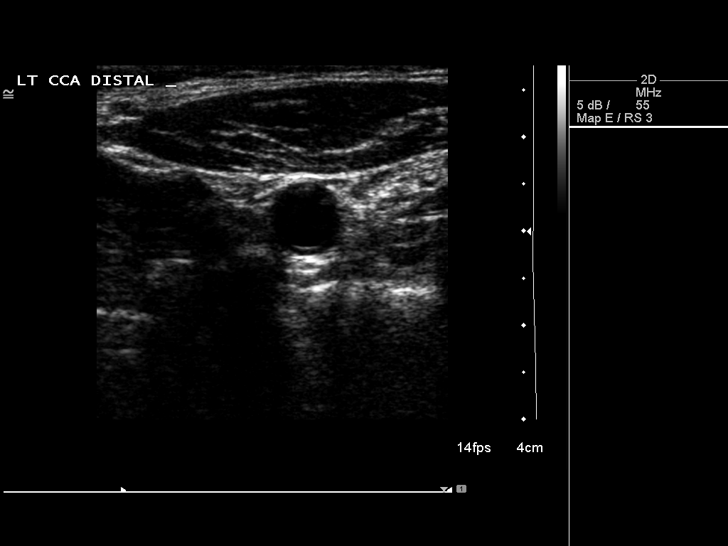
[im 52/63]
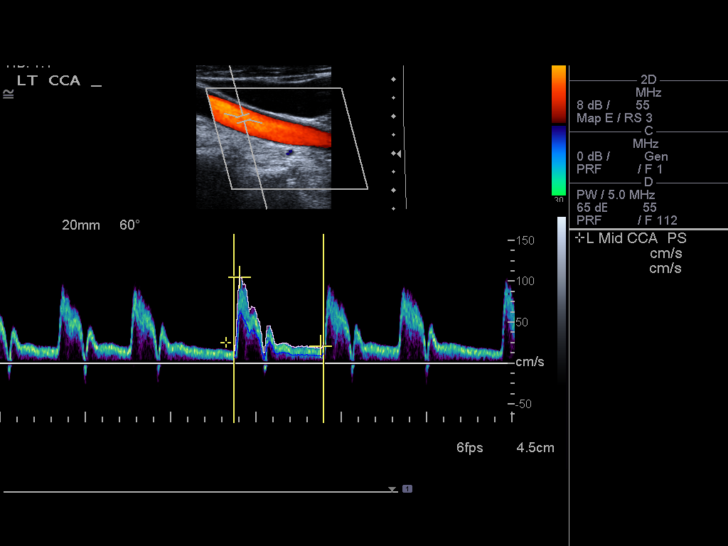
[im 57/63]
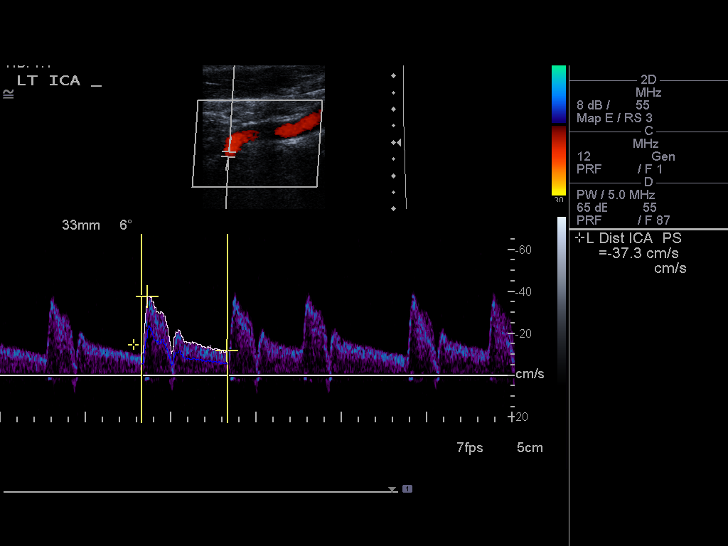
[im 63/63]
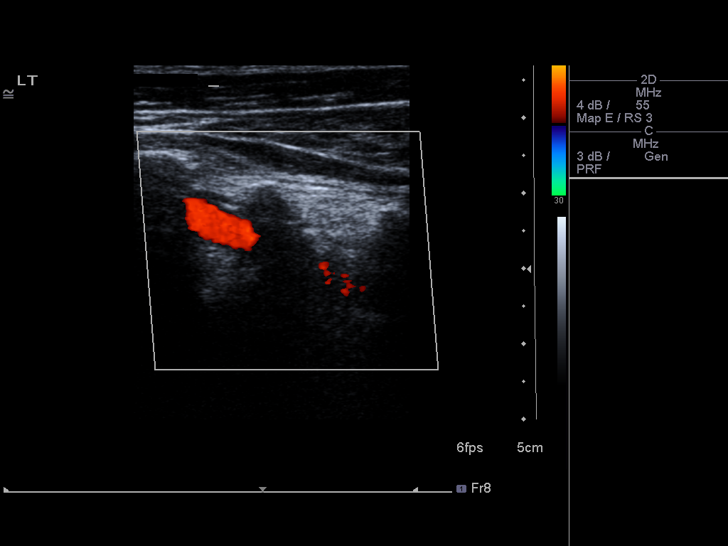

[13 of 24 positions shown; findings below may reference images not displayed]

Criteria:  Quantification of carotid stenosis is based on velocity
parameters that correlate the residual internal carotid diameter
with NASCET-based stenosis levels, using the diameter of the distal
internal carotid lumen as the denominator for stenosis measurement.

The following velocity measurements were obtained:

                 PEAK SYSTOLIC/END DIASTOLIC
RIGHT
ICA:                        74/18cm/sec
CCA:                        148/17cm/sec
SYSTOLIC ICA/CCA RATIO:
DIASTOLIC ICA/CCA RATIO:    104
ECA:                        96cm/sec

LEFT
ICA:                        53 of 13cm/sec
CCA:                        114/10cm/sec
SYSTOLIC ICA/CCA RATIO:
DIASTOLIC ICA/CCA RATIO:
ECA:                        98cm/sec
FINDINGS: RIGHT CAROTID ARTERY: Mild eccentric partially calcified plaque in
the carotid bulb extending to the proximal internal and external
carotid arteries without high-grade stenosis.  Normal wave forms
and color Doppler signal.

RIGHT VERTEBRAL ARTERY:  A 3.2 cm solid right thyroid nodule is
noted. Normal flow direction and waveform.

LEFT CAROTID ARTERY:
There is mild eccentric patchy plaque in the mid common carotid
artery and external carotid artery.  No high-grade stenosis.  There
is mild plaque in the carotid bulb.  Normal wave forms and color
Doppler signal.

LEFT VERTEBRAL ARTERY:  Normal flow direction and waveform.
IMPRESSION: 1.  Mild bilateral carotid bifurcation plaque, resulting in less
than 50% diameter stenosis. The exam does not exclude plaque
ulceration or embolization.  Continued surveillance recommended.
2.  3.2 cm right thyroid nodule. Consider further evaluation with
thyroid ultrasound.  If patient is clinically hyperthyroid,
consider nuclear medicine thyroid uptake and scan.

## 2012-03-20 ENCOUNTER — Ambulatory Visit
Admission: RE | Admit: 2012-03-20 | Discharge: 2012-03-20 | Disposition: A | Discharge status: Home / Self Care | Payer: Medicare Other | Source: Ambulatory Visit | Attending: Internal Medicine | Admitting: Internal Medicine

## 2012-03-20 DIAGNOSIS — E041 Nontoxic single thyroid nodule: Secondary | ICD-10-CM

## 2012-03-20 IMAGING — US US SOFT TISSUE HEAD/NECK
1 series · 14 of 25 positions shown · non-contrast
Comparison: Ultrasound of the carotid arteries of [DATE]

CLINICAL DATA: 3.2 cm thyroid nodule noted on ultrasound of the
carotid arteries

THYROID ULTRASOUND
TECHNIQUE: Ultrasound examination of the thyroid gland and adjacent
soft tissues was performed.

[Series 1: us soft tissue head/neck · 0.09mm/px · 14 of 32 slices shown]
[im 1/32]
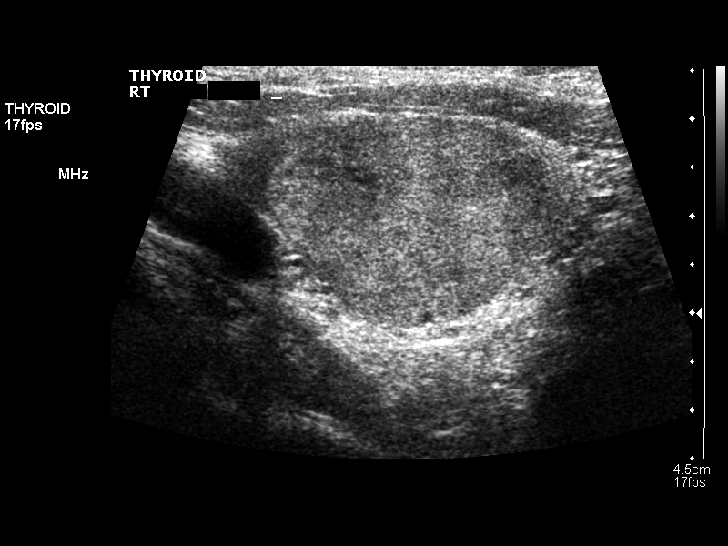
[im 3/32]
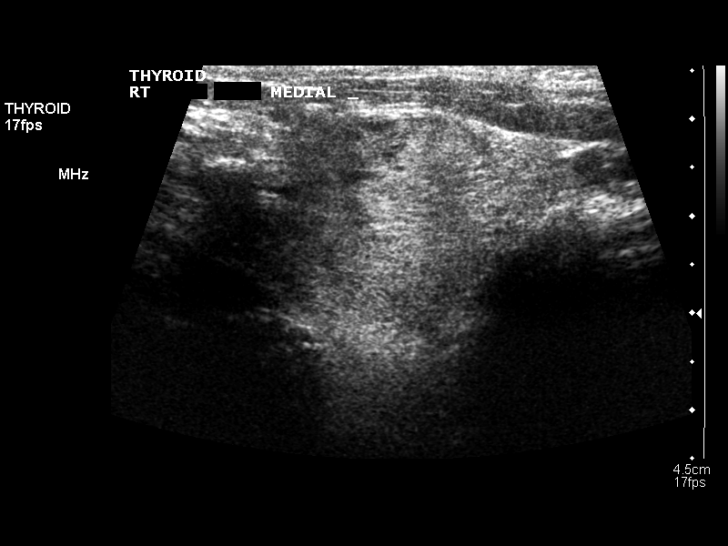
[im 6/32]
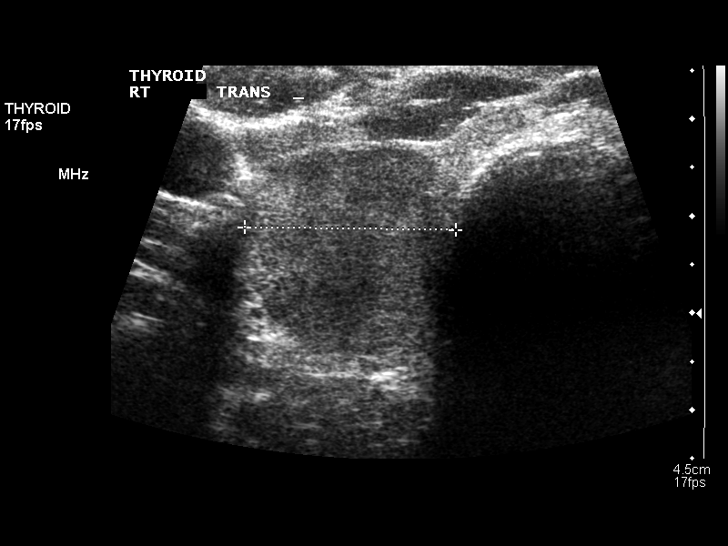
[im 8/32]
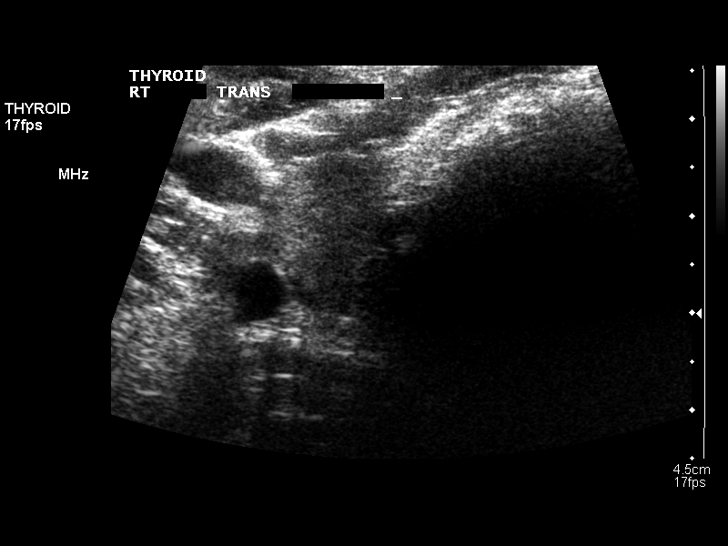
[im 11/32]
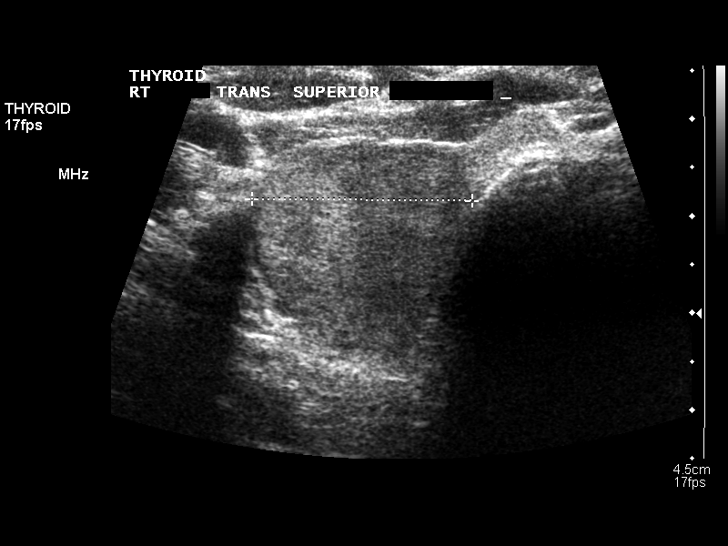
[im 12/32]
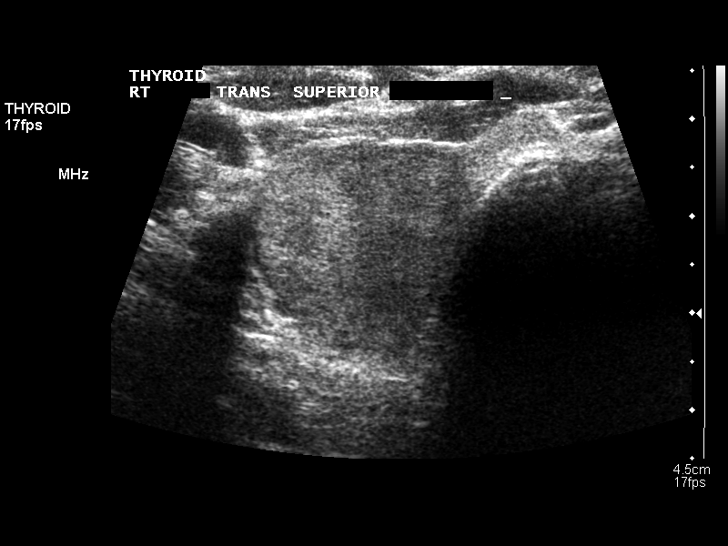
[im 15/32]
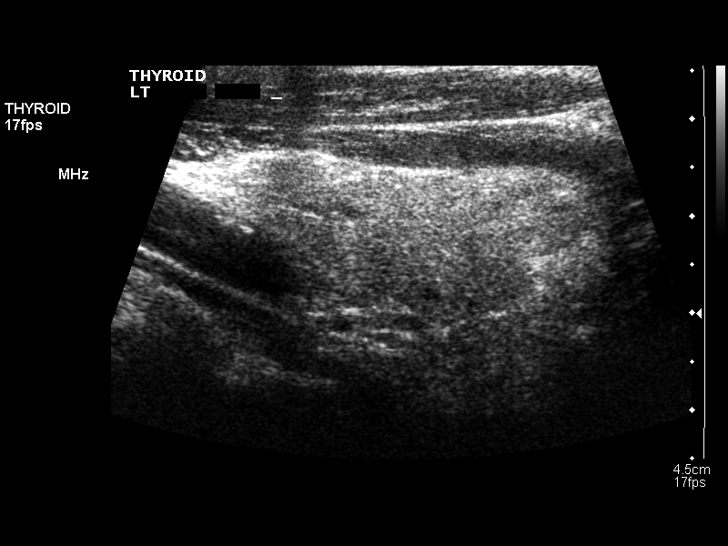
[im 17/32]
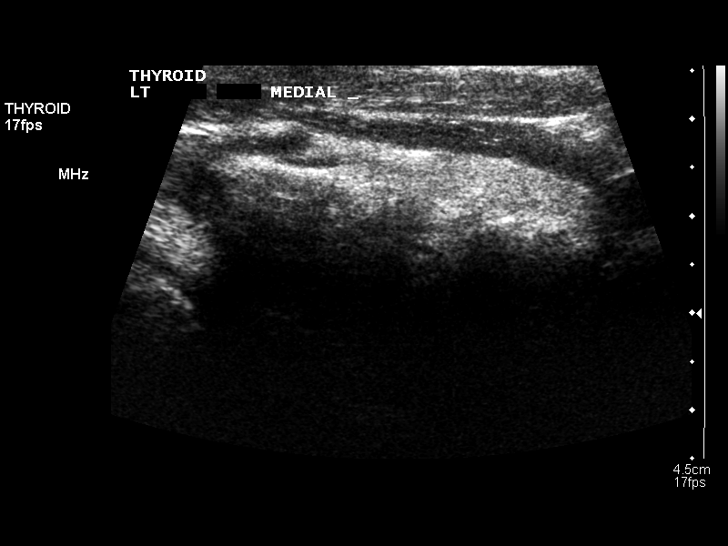
[im 20/32]
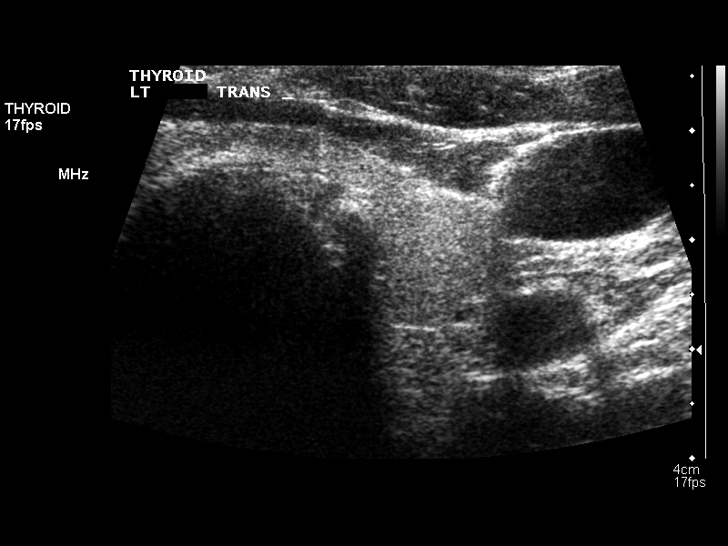
[im 21/32]
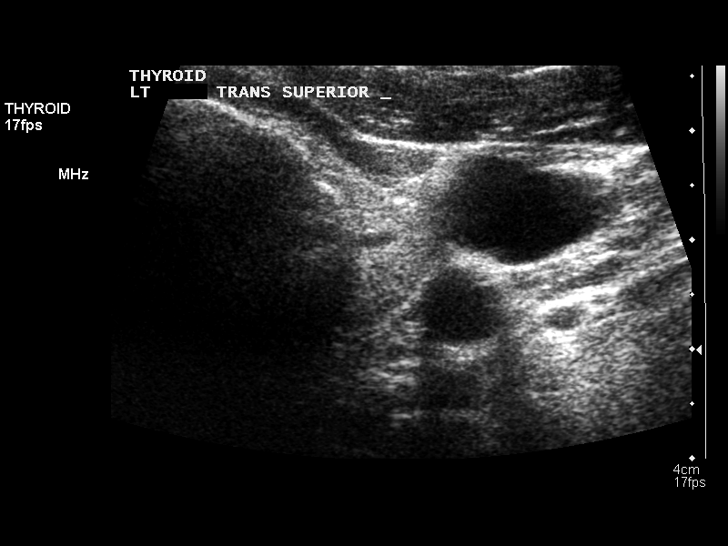
[im 24/32]
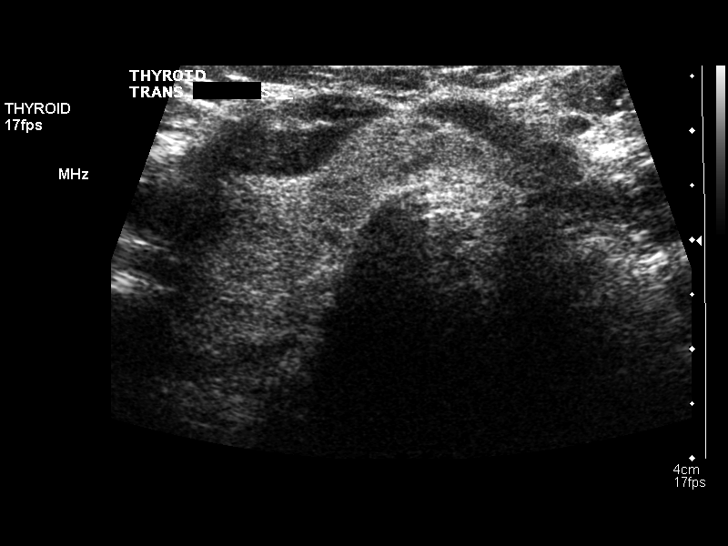
[im 26/32]
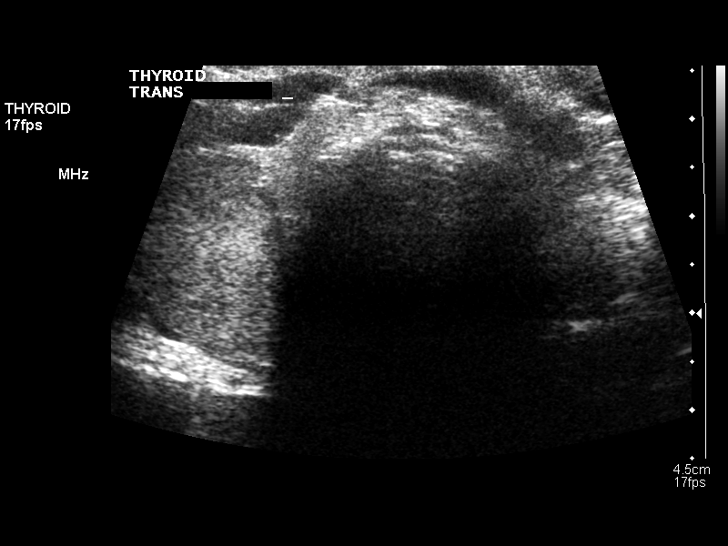
[im 29/32]
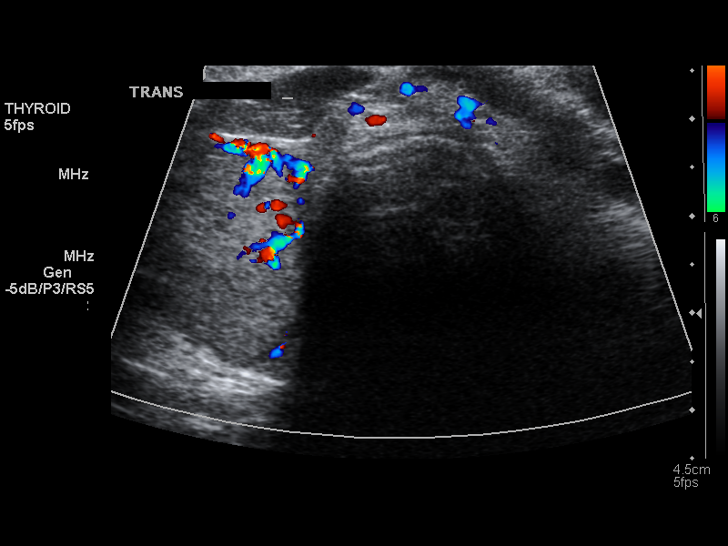
[im 32/32]
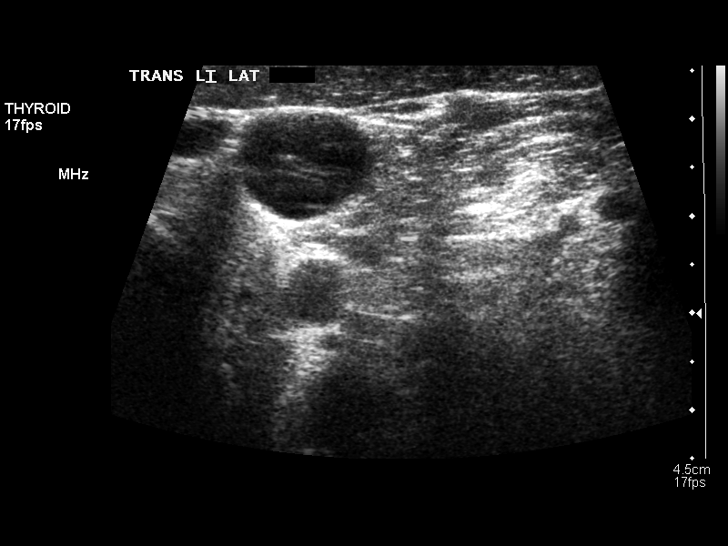

[14 of 25 positions shown; findings below may reference images not displayed]

FINDINGS: Right thyroid lobe:  4.0 x 2.5 x 2.2 cm.
Left thyroid lobe:  3.7 x 1.4 x 1.1 cm.
Isthmus:  6 mm in thickness.

Focal nodules:  The echogenicity of the thyroid gland is
homogeneous.  Much of the right lobe of thyroid is occupied by a
single solid inhomogeneous nodule of 3.5 x 2.1 x 2.3 cm.  Findings
meet consensus criteria for biopsy.  Ultrasound-guided fine needle
aspiration should be considered, as per the consensus statement:
Management of Thyroid Nodules Detected at US:  Society of
Radiologists in Ultrasound Consensus Conference Statement.
Radiology [CT]; [DATE]. No left thyroid nodule is seen.

Lymphadenopathy:  Large solid nodule occupies much of the right
lobe of thyroid.  Consider biopsy.
IMPRESSION:

## 2012-03-31 DIAGNOSIS — Z954 Presence of other heart-valve replacement: Secondary | ICD-10-CM | POA: Diagnosis not present

## 2012-03-31 DIAGNOSIS — Z7901 Long term (current) use of anticoagulants: Secondary | ICD-10-CM | POA: Diagnosis not present

## 2012-03-31 DIAGNOSIS — I2699 Other pulmonary embolism without acute cor pulmonale: Secondary | ICD-10-CM | POA: Diagnosis not present

## 2012-04-01 ENCOUNTER — Other Ambulatory Visit: Payer: Self-pay | Admitting: Internal Medicine

## 2012-04-01 DIAGNOSIS — E041 Nontoxic single thyroid nodule: Secondary | ICD-10-CM

## 2012-04-24 DIAGNOSIS — Z7901 Long term (current) use of anticoagulants: Secondary | ICD-10-CM | POA: Diagnosis not present

## 2012-04-24 DIAGNOSIS — Z79899 Other long term (current) drug therapy: Secondary | ICD-10-CM | POA: Diagnosis not present

## 2012-04-24 DIAGNOSIS — I2699 Other pulmonary embolism without acute cor pulmonale: Secondary | ICD-10-CM | POA: Diagnosis not present

## 2012-04-29 ENCOUNTER — Ambulatory Visit
Admission: RE | Admit: 2012-04-29 | Discharge: 2012-04-29 | Disposition: A | Discharge status: Home / Self Care | Payer: Medicare Other | Source: Ambulatory Visit | Attending: Internal Medicine | Admitting: Internal Medicine

## 2012-04-29 ENCOUNTER — Other Ambulatory Visit (HOSPITAL_COMMUNITY)
Admission: RE | Admit: 2012-04-29 | Discharge: 2012-04-29 | Disposition: A | Discharge status: Home / Self Care | Payer: Medicare Other | Source: Ambulatory Visit | Attending: Interventional Radiology | Admitting: Interventional Radiology

## 2012-04-29 DIAGNOSIS — E049 Nontoxic goiter, unspecified: Secondary | ICD-10-CM | POA: Diagnosis not present

## 2012-04-29 DIAGNOSIS — E0789 Other specified disorders of thyroid: Secondary | ICD-10-CM | POA: Diagnosis not present

## 2012-04-29 DIAGNOSIS — E041 Nontoxic single thyroid nodule: Secondary | ICD-10-CM

## 2012-04-29 DIAGNOSIS — Z7901 Long term (current) use of anticoagulants: Secondary | ICD-10-CM | POA: Diagnosis not present

## 2012-04-29 DIAGNOSIS — I2699 Other pulmonary embolism without acute cor pulmonale: Secondary | ICD-10-CM | POA: Diagnosis not present

## 2012-04-29 IMAGING — US US THYROID BIOPSY
1 series · 14 of 15 positions shown · non-contrast
Comparison: none

CLINICAL DATA: Right thyroid lesion incidentally noted on carotid
ultrasound..

ULTRASOUND-GUIDED THYROID ASPIRATION BIOPSY
TECHNIQUE: Survey ultrasound was performed and the dominant lesion
in the mid right lobe was localized.  An appropriate skin entry
site was determined.  Skin was marked, then prepped with Betadine,
draped in usual sterile fashion, and infiltrated locally with 1%
lidocaine.  Under real-time ultrasound guidance, 4  passes were
made into the lesion with 25 gauge needles.  The patient tolerated
procedure well, with no immediate complications.
IMPRESSION
1.  Technically successful ultrasound-guided thyroid aspiration
biopsy

[Series 1: us thyroid biopsy · 0.08mm/px · 15 acquisitions, 14 frames shown]
[im 1/15]
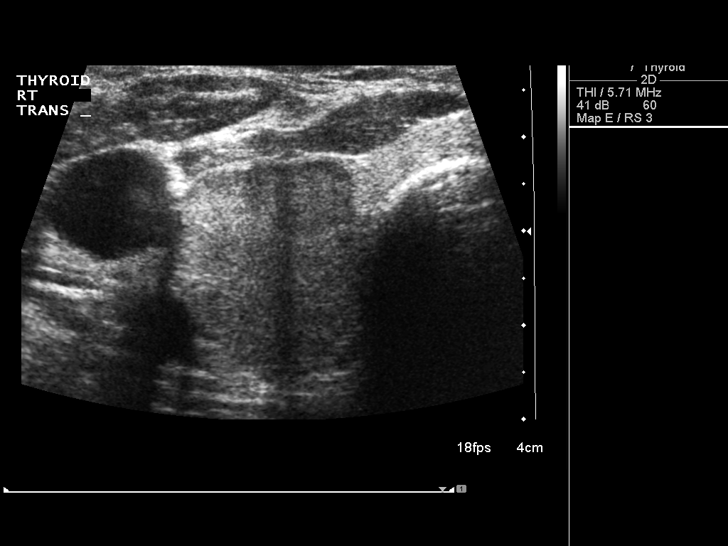
[im 2/15]
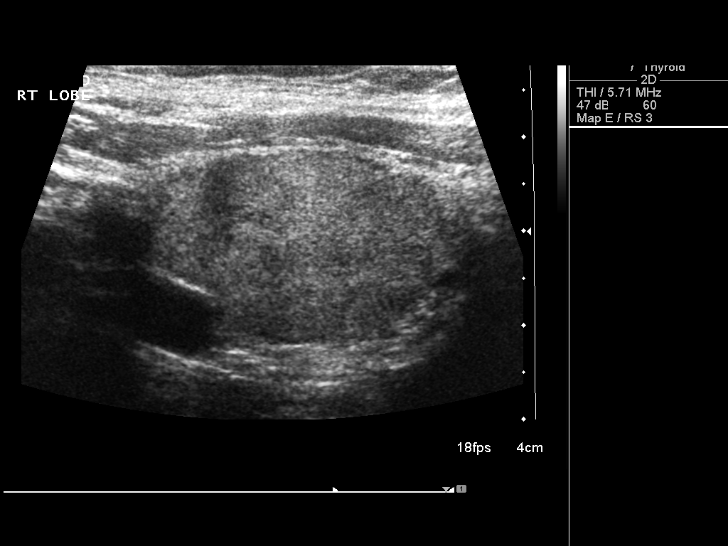
[im 3/15]
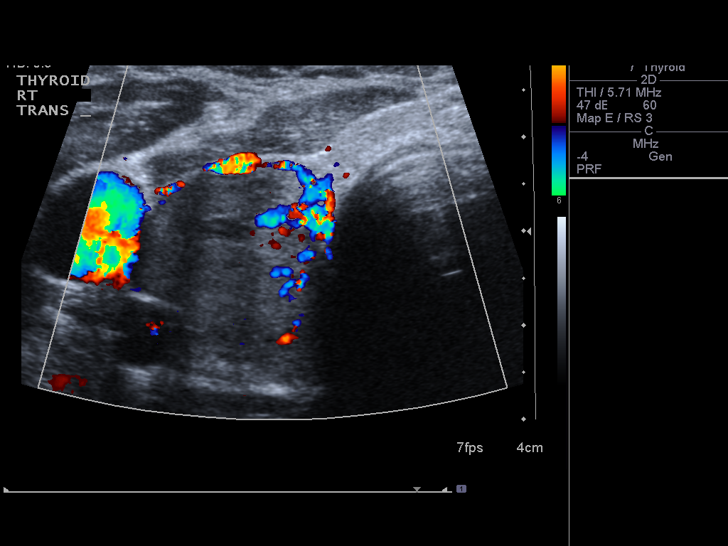
[im 4/15]
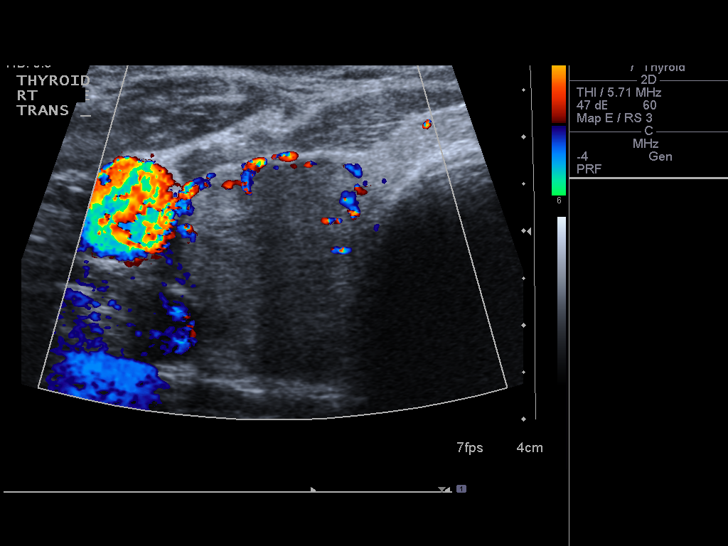
[im 5/15]
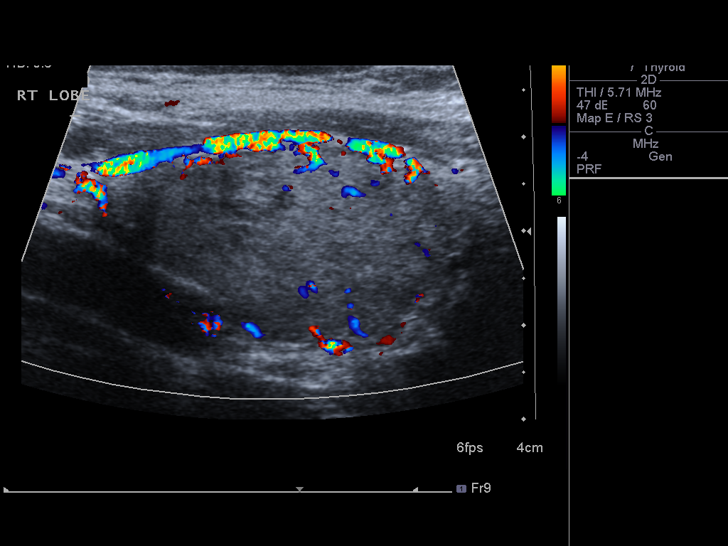
[im 6/15]
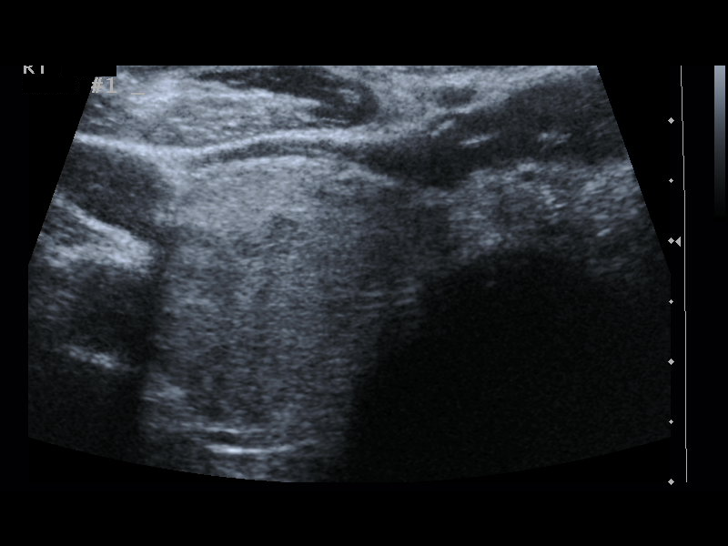
[im 7/15]
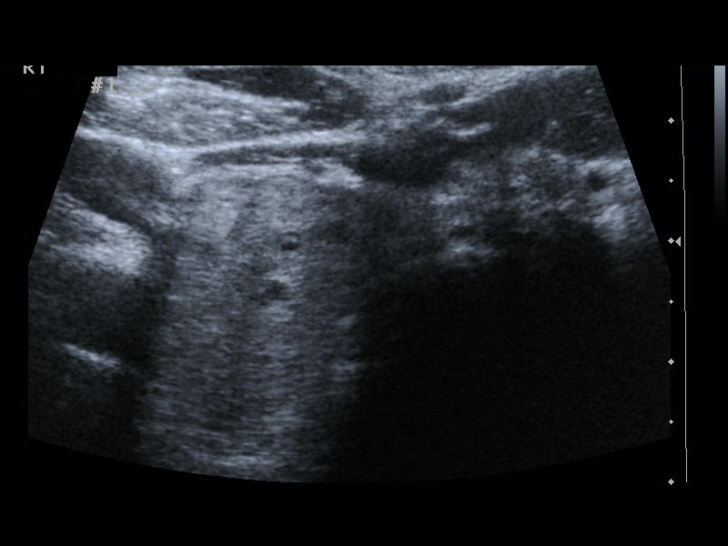
[im 9/15]
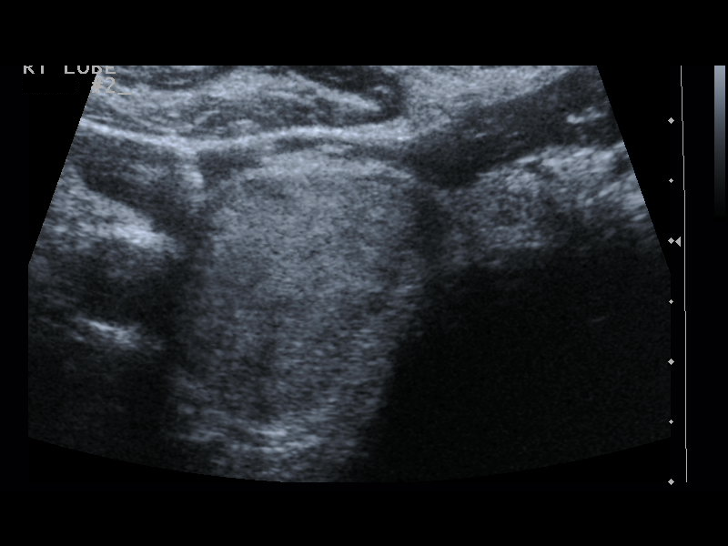
[im 10/15]
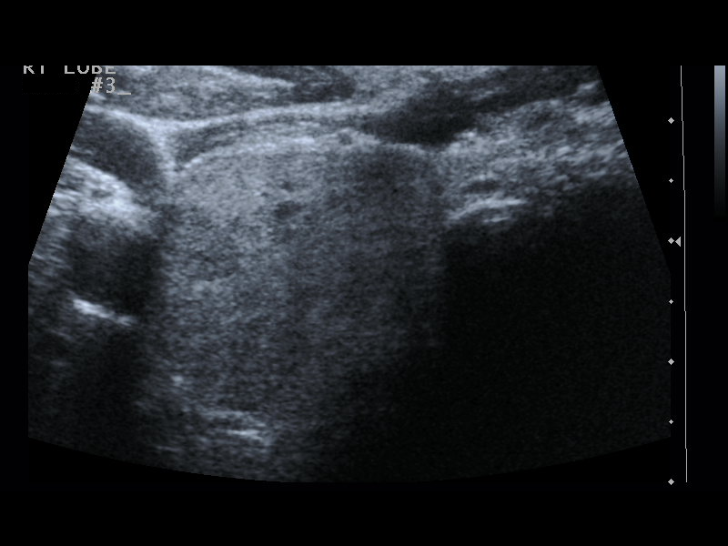
[im 11/15]
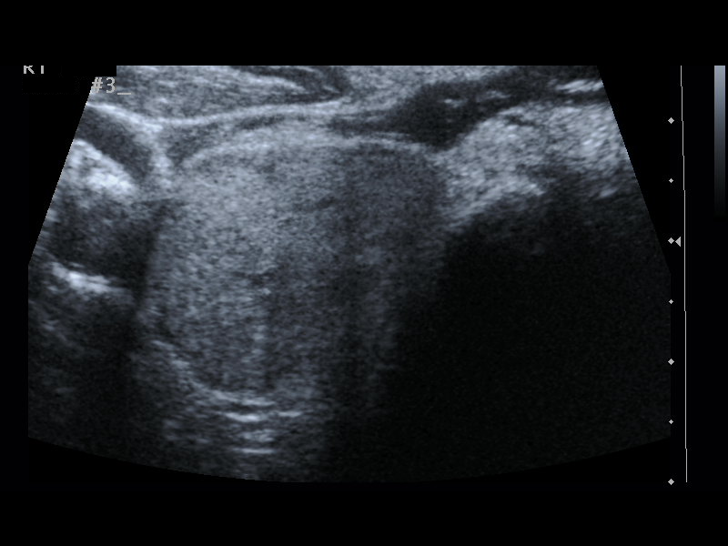
[im 12/15]
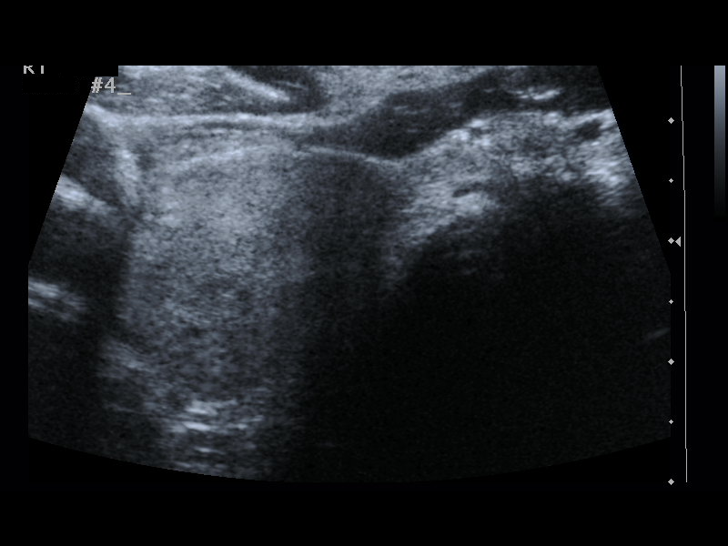
[im 13/15]
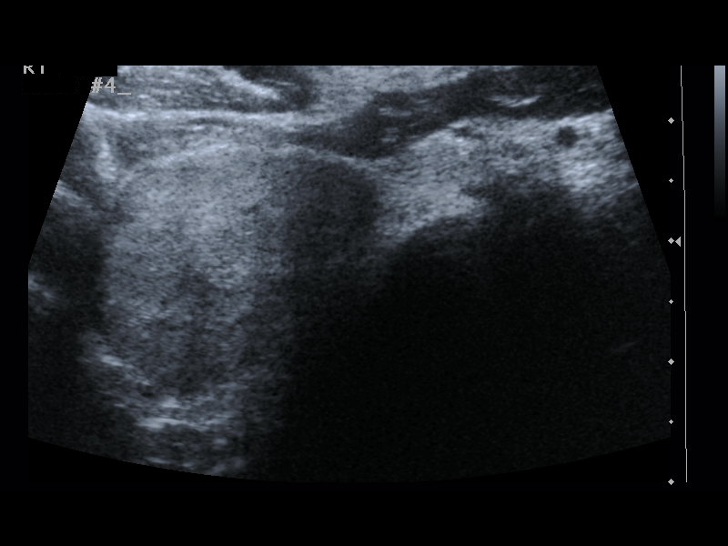
[im 14/15]
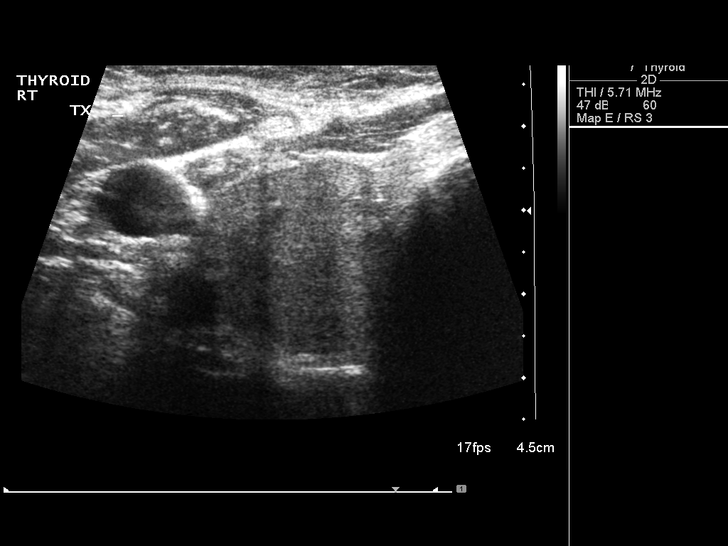
[im 15/15]
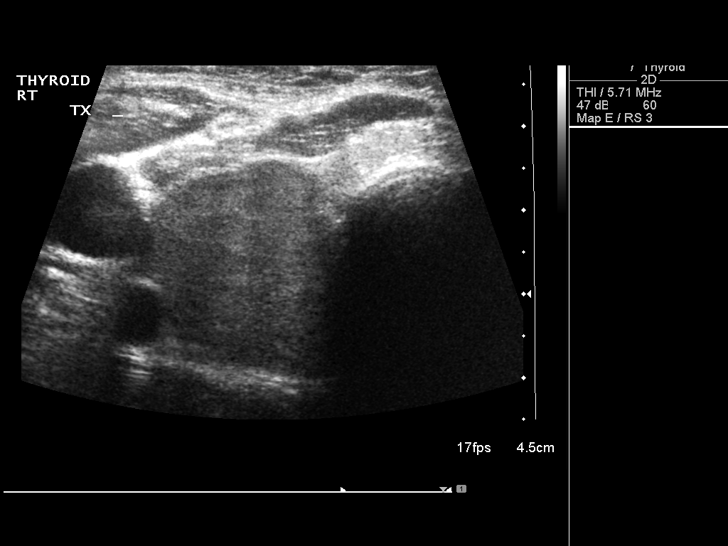

[14 of 15 positions shown; findings below may reference images not displayed]

## 2012-05-05 DIAGNOSIS — I2699 Other pulmonary embolism without acute cor pulmonale: Secondary | ICD-10-CM | POA: Diagnosis not present

## 2012-05-05 DIAGNOSIS — Z7901 Long term (current) use of anticoagulants: Secondary | ICD-10-CM | POA: Diagnosis not present

## 2012-05-06 DIAGNOSIS — Z7901 Long term (current) use of anticoagulants: Secondary | ICD-10-CM | POA: Diagnosis not present

## 2012-05-06 DIAGNOSIS — I2699 Other pulmonary embolism without acute cor pulmonale: Secondary | ICD-10-CM | POA: Diagnosis not present

## 2012-05-20 DIAGNOSIS — I2699 Other pulmonary embolism without acute cor pulmonale: Secondary | ICD-10-CM | POA: Diagnosis not present

## 2012-05-20 DIAGNOSIS — Z7901 Long term (current) use of anticoagulants: Secondary | ICD-10-CM | POA: Diagnosis not present

## 2012-06-10 DIAGNOSIS — C61 Malignant neoplasm of prostate: Secondary | ICD-10-CM | POA: Diagnosis not present

## 2012-06-10 DIAGNOSIS — Z131 Encounter for screening for diabetes mellitus: Secondary | ICD-10-CM | POA: Diagnosis not present

## 2012-06-10 DIAGNOSIS — Z0000 Encounter for general adult medical examination without abnormal findings: Secondary | ICD-10-CM | POA: Diagnosis not present

## 2012-06-10 DIAGNOSIS — E785 Hyperlipidemia, unspecified: Secondary | ICD-10-CM | POA: Diagnosis not present

## 2012-06-10 DIAGNOSIS — Z1331 Encounter for screening for depression: Secondary | ICD-10-CM | POA: Diagnosis not present

## 2012-06-17 DIAGNOSIS — Z7901 Long term (current) use of anticoagulants: Secondary | ICD-10-CM | POA: Diagnosis not present

## 2012-06-17 DIAGNOSIS — I2699 Other pulmonary embolism without acute cor pulmonale: Secondary | ICD-10-CM | POA: Diagnosis not present

## 2012-07-02 DIAGNOSIS — J209 Acute bronchitis, unspecified: Secondary | ICD-10-CM | POA: Diagnosis not present

## 2012-07-18 DIAGNOSIS — Z7901 Long term (current) use of anticoagulants: Secondary | ICD-10-CM | POA: Diagnosis not present

## 2012-07-18 DIAGNOSIS — I2699 Other pulmonary embolism without acute cor pulmonale: Secondary | ICD-10-CM | POA: Diagnosis not present

## 2012-08-19 DIAGNOSIS — Z7901 Long term (current) use of anticoagulants: Secondary | ICD-10-CM | POA: Diagnosis not present

## 2012-08-19 DIAGNOSIS — I2699 Other pulmonary embolism without acute cor pulmonale: Secondary | ICD-10-CM | POA: Diagnosis not present

## 2012-09-16 DIAGNOSIS — I2699 Other pulmonary embolism without acute cor pulmonale: Secondary | ICD-10-CM | POA: Diagnosis not present

## 2012-09-16 DIAGNOSIS — Z7901 Long term (current) use of anticoagulants: Secondary | ICD-10-CM | POA: Diagnosis not present

## 2012-10-21 DIAGNOSIS — I2699 Other pulmonary embolism without acute cor pulmonale: Secondary | ICD-10-CM | POA: Diagnosis not present

## 2012-10-21 DIAGNOSIS — Z7901 Long term (current) use of anticoagulants: Secondary | ICD-10-CM | POA: Diagnosis not present

## 2012-12-02 DIAGNOSIS — I2699 Other pulmonary embolism without acute cor pulmonale: Secondary | ICD-10-CM | POA: Diagnosis not present

## 2012-12-02 DIAGNOSIS — Z7901 Long term (current) use of anticoagulants: Secondary | ICD-10-CM | POA: Diagnosis not present

## 2012-12-10 ENCOUNTER — Other Ambulatory Visit: Payer: Self-pay | Admitting: Dermatology

## 2012-12-10 DIAGNOSIS — D235 Other benign neoplasm of skin of trunk: Secondary | ICD-10-CM | POA: Diagnosis not present

## 2012-12-10 DIAGNOSIS — C4370 Malignant melanoma of unspecified lower limb, including hip: Secondary | ICD-10-CM | POA: Diagnosis not present

## 2012-12-10 DIAGNOSIS — Z85820 Personal history of malignant melanoma of skin: Secondary | ICD-10-CM | POA: Diagnosis not present

## 2012-12-10 DIAGNOSIS — C44319 Basal cell carcinoma of skin of other parts of face: Secondary | ICD-10-CM | POA: Diagnosis not present

## 2012-12-10 DIAGNOSIS — C4359 Malignant melanoma of other part of trunk: Secondary | ICD-10-CM | POA: Diagnosis not present

## 2013-01-27 ENCOUNTER — Other Ambulatory Visit: Payer: Self-pay | Admitting: Dermatology

## 2013-01-27 DIAGNOSIS — C4359 Malignant melanoma of other part of trunk: Secondary | ICD-10-CM | POA: Diagnosis not present

## 2013-01-27 DIAGNOSIS — I2699 Other pulmonary embolism without acute cor pulmonale: Secondary | ICD-10-CM | POA: Diagnosis not present

## 2013-01-27 DIAGNOSIS — C4370 Malignant melanoma of unspecified lower limb, including hip: Secondary | ICD-10-CM | POA: Diagnosis not present

## 2013-01-27 DIAGNOSIS — Z7901 Long term (current) use of anticoagulants: Secondary | ICD-10-CM | POA: Diagnosis not present

## 2013-01-27 DIAGNOSIS — D485 Neoplasm of uncertain behavior of skin: Secondary | ICD-10-CM | POA: Diagnosis not present

## 2013-02-17 DIAGNOSIS — I2699 Other pulmonary embolism without acute cor pulmonale: Secondary | ICD-10-CM | POA: Diagnosis not present

## 2013-02-17 DIAGNOSIS — I2510 Atherosclerotic heart disease of native coronary artery without angina pectoris: Secondary | ICD-10-CM | POA: Diagnosis not present

## 2013-02-17 DIAGNOSIS — Z954 Presence of other heart-valve replacement: Secondary | ICD-10-CM | POA: Diagnosis not present

## 2013-02-17 DIAGNOSIS — I059 Rheumatic mitral valve disease, unspecified: Secondary | ICD-10-CM | POA: Diagnosis not present

## 2013-02-17 DIAGNOSIS — E785 Hyperlipidemia, unspecified: Secondary | ICD-10-CM | POA: Diagnosis not present

## 2013-03-11 ENCOUNTER — Ambulatory Visit (INDEPENDENT_AMBULATORY_CARE_PROVIDER_SITE_OTHER): Payer: Medicare Other | Admitting: Pharmacist

## 2013-03-11 DIAGNOSIS — I2699 Other pulmonary embolism without acute cor pulmonale: Secondary | ICD-10-CM | POA: Diagnosis not present

## 2013-03-11 DIAGNOSIS — I749 Embolism and thrombosis of unspecified artery: Secondary | ICD-10-CM

## 2013-03-11 LAB — POCT INR: INR: 2.7

## 2013-03-19 DIAGNOSIS — Z23 Encounter for immunization: Secondary | ICD-10-CM | POA: Diagnosis not present

## 2013-03-20 DIAGNOSIS — H26499 Other secondary cataract, unspecified eye: Secondary | ICD-10-CM | POA: Diagnosis not present

## 2013-03-23 DIAGNOSIS — Z85820 Personal history of malignant melanoma of skin: Secondary | ICD-10-CM | POA: Diagnosis not present

## 2013-03-23 DIAGNOSIS — L821 Other seborrheic keratosis: Secondary | ICD-10-CM | POA: Diagnosis not present

## 2013-04-21 ENCOUNTER — Ambulatory Visit (INDEPENDENT_AMBULATORY_CARE_PROVIDER_SITE_OTHER): Payer: Medicare Other | Admitting: Pharmacist

## 2013-04-21 DIAGNOSIS — I2699 Other pulmonary embolism without acute cor pulmonale: Secondary | ICD-10-CM

## 2013-04-21 DIAGNOSIS — I749 Embolism and thrombosis of unspecified artery: Secondary | ICD-10-CM

## 2013-04-21 LAB — POCT INR: INR: 2.7

## 2013-05-04 ENCOUNTER — Other Ambulatory Visit (HOSPITAL_COMMUNITY): Payer: Self-pay | Admitting: Interventional Cardiology

## 2013-05-04 ENCOUNTER — Ambulatory Visit (HOSPITAL_COMMUNITY): Payer: Medicare Other | Attending: Interventional Cardiology | Admitting: Cardiology

## 2013-05-04 DIAGNOSIS — I359 Nonrheumatic aortic valve disorder, unspecified: Secondary | ICD-10-CM | POA: Diagnosis not present

## 2013-05-04 NOTE — Progress Notes (Signed)
Echo performed. 

## 2013-05-18 ENCOUNTER — Telehealth: Payer: Self-pay | Admitting: Interventional Cardiology

## 2013-05-18 NOTE — Telephone Encounter (Signed)
Pt notified of echo results

## 2013-05-18 NOTE — Telephone Encounter (Signed)
Follow up    Pt returned our call for his Echo 12/15 test results. Please give him a call back.

## 2013-06-02 ENCOUNTER — Ambulatory Visit (INDEPENDENT_AMBULATORY_CARE_PROVIDER_SITE_OTHER): Payer: Medicare Other | Admitting: Pharmacist

## 2013-06-02 DIAGNOSIS — I749 Embolism and thrombosis of unspecified artery: Secondary | ICD-10-CM

## 2013-06-02 DIAGNOSIS — I2699 Other pulmonary embolism without acute cor pulmonale: Secondary | ICD-10-CM

## 2013-06-02 LAB — POCT INR: INR: 3.4

## 2013-06-09 ENCOUNTER — Other Ambulatory Visit: Payer: Self-pay | Admitting: Dermatology

## 2013-06-09 DIAGNOSIS — L570 Actinic keratosis: Secondary | ICD-10-CM | POA: Diagnosis not present

## 2013-06-09 DIAGNOSIS — D485 Neoplasm of uncertain behavior of skin: Secondary | ICD-10-CM | POA: Diagnosis not present

## 2013-06-11 DIAGNOSIS — K644 Residual hemorrhoidal skin tags: Secondary | ICD-10-CM | POA: Diagnosis not present

## 2013-06-11 DIAGNOSIS — E785 Hyperlipidemia, unspecified: Secondary | ICD-10-CM | POA: Diagnosis not present

## 2013-06-11 DIAGNOSIS — I359 Nonrheumatic aortic valve disorder, unspecified: Secondary | ICD-10-CM | POA: Diagnosis not present

## 2013-06-11 DIAGNOSIS — I2510 Atherosclerotic heart disease of native coronary artery without angina pectoris: Secondary | ICD-10-CM | POA: Diagnosis not present

## 2013-06-11 DIAGNOSIS — Z8546 Personal history of malignant neoplasm of prostate: Secondary | ICD-10-CM | POA: Diagnosis not present

## 2013-06-11 DIAGNOSIS — Z23 Encounter for immunization: Secondary | ICD-10-CM | POA: Diagnosis not present

## 2013-06-11 DIAGNOSIS — Z1331 Encounter for screening for depression: Secondary | ICD-10-CM | POA: Diagnosis not present

## 2013-06-23 ENCOUNTER — Ambulatory Visit (INDEPENDENT_AMBULATORY_CARE_PROVIDER_SITE_OTHER): Payer: Medicare Other | Admitting: Pharmacist

## 2013-06-23 DIAGNOSIS — I2699 Other pulmonary embolism without acute cor pulmonale: Secondary | ICD-10-CM

## 2013-06-23 DIAGNOSIS — I749 Embolism and thrombosis of unspecified artery: Secondary | ICD-10-CM | POA: Diagnosis not present

## 2013-06-23 LAB — POCT INR: INR: 2.8

## 2013-08-04 ENCOUNTER — Ambulatory Visit (INDEPENDENT_AMBULATORY_CARE_PROVIDER_SITE_OTHER): Payer: Medicare Other

## 2013-08-04 DIAGNOSIS — I2699 Other pulmonary embolism without acute cor pulmonale: Secondary | ICD-10-CM

## 2013-08-04 DIAGNOSIS — I749 Embolism and thrombosis of unspecified artery: Secondary | ICD-10-CM

## 2013-08-04 LAB — POCT INR: INR: 3.1

## 2013-09-15 ENCOUNTER — Ambulatory Visit (INDEPENDENT_AMBULATORY_CARE_PROVIDER_SITE_OTHER): Payer: Medicare Other

## 2013-09-15 DIAGNOSIS — I2699 Other pulmonary embolism without acute cor pulmonale: Secondary | ICD-10-CM | POA: Diagnosis not present

## 2013-09-15 DIAGNOSIS — I749 Embolism and thrombosis of unspecified artery: Secondary | ICD-10-CM | POA: Diagnosis not present

## 2013-09-15 LAB — POCT INR: INR: 3

## 2013-09-22 DIAGNOSIS — L570 Actinic keratosis: Secondary | ICD-10-CM | POA: Diagnosis not present

## 2013-09-22 DIAGNOSIS — Z85828 Personal history of other malignant neoplasm of skin: Secondary | ICD-10-CM | POA: Diagnosis not present

## 2013-09-22 DIAGNOSIS — Z85820 Personal history of malignant melanoma of skin: Secondary | ICD-10-CM | POA: Diagnosis not present

## 2013-10-29 ENCOUNTER — Ambulatory Visit (INDEPENDENT_AMBULATORY_CARE_PROVIDER_SITE_OTHER): Payer: Medicare Other | Admitting: *Deleted

## 2013-10-29 DIAGNOSIS — I749 Embolism and thrombosis of unspecified artery: Secondary | ICD-10-CM

## 2013-10-29 DIAGNOSIS — I2699 Other pulmonary embolism without acute cor pulmonale: Secondary | ICD-10-CM | POA: Diagnosis not present

## 2013-10-29 LAB — POCT INR: INR: 3.6

## 2013-11-12 ENCOUNTER — Ambulatory Visit (INDEPENDENT_AMBULATORY_CARE_PROVIDER_SITE_OTHER): Payer: Medicare Other | Admitting: Pharmacist Clinician (PhC)/ Clinical Pharmacy Specialist

## 2013-11-12 DIAGNOSIS — I749 Embolism and thrombosis of unspecified artery: Secondary | ICD-10-CM

## 2013-11-12 DIAGNOSIS — I2699 Other pulmonary embolism without acute cor pulmonale: Secondary | ICD-10-CM | POA: Diagnosis not present

## 2013-11-12 LAB — POCT INR: INR: 2.7

## 2013-12-07 DIAGNOSIS — H0019 Chalazion unspecified eye, unspecified eyelid: Secondary | ICD-10-CM | POA: Diagnosis not present

## 2013-12-10 ENCOUNTER — Ambulatory Visit (INDEPENDENT_AMBULATORY_CARE_PROVIDER_SITE_OTHER): Payer: Medicare Other | Admitting: *Deleted

## 2013-12-10 DIAGNOSIS — I2699 Other pulmonary embolism without acute cor pulmonale: Secondary | ICD-10-CM

## 2013-12-10 DIAGNOSIS — I749 Embolism and thrombosis of unspecified artery: Secondary | ICD-10-CM

## 2013-12-10 LAB — POCT INR: INR: 2.5

## 2013-12-11 DIAGNOSIS — H0019 Chalazion unspecified eye, unspecified eyelid: Secondary | ICD-10-CM | POA: Diagnosis not present

## 2014-01-12 ENCOUNTER — Ambulatory Visit (INDEPENDENT_AMBULATORY_CARE_PROVIDER_SITE_OTHER): Payer: Medicare Other | Admitting: *Deleted

## 2014-01-12 DIAGNOSIS — I2699 Other pulmonary embolism without acute cor pulmonale: Secondary | ICD-10-CM | POA: Diagnosis not present

## 2014-01-12 DIAGNOSIS — I749 Embolism and thrombosis of unspecified artery: Secondary | ICD-10-CM

## 2014-01-12 LAB — POCT INR: INR: 3.4

## 2014-02-02 ENCOUNTER — Ambulatory Visit (INDEPENDENT_AMBULATORY_CARE_PROVIDER_SITE_OTHER): Payer: Medicare Other | Admitting: Pharmacist

## 2014-02-02 DIAGNOSIS — I749 Embolism and thrombosis of unspecified artery: Secondary | ICD-10-CM | POA: Diagnosis not present

## 2014-02-02 DIAGNOSIS — I2699 Other pulmonary embolism without acute cor pulmonale: Secondary | ICD-10-CM | POA: Diagnosis not present

## 2014-02-02 LAB — POCT INR: INR: 2.3

## 2014-02-25 ENCOUNTER — Ambulatory Visit (INDEPENDENT_AMBULATORY_CARE_PROVIDER_SITE_OTHER): Payer: Medicare Other | Admitting: Interventional Cardiology

## 2014-02-25 ENCOUNTER — Encounter: Payer: Self-pay | Admitting: Interventional Cardiology

## 2014-02-25 ENCOUNTER — Ambulatory Visit (INDEPENDENT_AMBULATORY_CARE_PROVIDER_SITE_OTHER): Payer: Medicare Other | Admitting: Pharmacist Clinician (PhC)/ Clinical Pharmacy Specialist

## 2014-02-25 ENCOUNTER — Encounter: Payer: Self-pay | Admitting: Cardiology

## 2014-02-25 VITALS — BP 132/64 | HR 63 | Ht 72.0 in | Wt 207.2 lb

## 2014-02-25 DIAGNOSIS — I350 Nonrheumatic aortic (valve) stenosis: Secondary | ICD-10-CM | POA: Insufficient documentation

## 2014-02-25 DIAGNOSIS — I2699 Other pulmonary embolism without acute cor pulmonale: Secondary | ICD-10-CM | POA: Diagnosis not present

## 2014-02-25 DIAGNOSIS — Z23 Encounter for immunization: Secondary | ICD-10-CM

## 2014-02-25 DIAGNOSIS — E785 Hyperlipidemia, unspecified: Secondary | ICD-10-CM | POA: Insufficient documentation

## 2014-02-25 DIAGNOSIS — Z952 Presence of prosthetic heart valve: Secondary | ICD-10-CM | POA: Insufficient documentation

## 2014-02-25 DIAGNOSIS — I059 Rheumatic mitral valve disease, unspecified: Secondary | ICD-10-CM | POA: Insufficient documentation

## 2014-02-25 DIAGNOSIS — Z954 Presence of other heart-valve replacement: Secondary | ICD-10-CM

## 2014-02-25 DIAGNOSIS — I2510 Atherosclerotic heart disease of native coronary artery without angina pectoris: Secondary | ICD-10-CM | POA: Diagnosis not present

## 2014-02-25 LAB — POCT INR: INR: 1.9

## 2014-02-25 NOTE — Progress Notes (Signed)
Patient ID: TYKEEM LANZER, male   DOB: January 16, 1930, 80 y.o.   MRN: [id number]    [facility], [address] [place], [place]  [address] Phone: [phone] Fax:  [phone]  Date:  02/25/2014   ID:  AARIZ MAISH, DOB [birth year], MRN [id number]  PCP:  Irven Shelling, MD      History of Present Illness: HIROKI WINT is a 80 y.o. male who has had an aortic valve replacement and single vessel bypass in 2009.HE had a negative stress test in 2011. No chest pain prior to valve replacement surgery. He did have SHOB.  He exercises a few days a week. He rides a bike for 30 minutes. He works as an Interior and spatial designer. He had melanoma surgery in the past.  He feels that his balance is getting worse. He bruises easily and bleeds easily. No blood in the stool.     Wt Readings from Last 3 Encounters:  02/25/14 207 lb 3.2 oz (93.985 kg)     Past Medical History  Diagnosis Date  . Adenocarcinoma of prostate   . Dyslipidemia   . Aortic stenosis 06/19/2007    s/p AVR, tissue type,   . Hx of valvuloplasty 06/19/2007    mitral ring, repair P2 MV  . Single vessel coronary artery disease 06/19/2007    s/p SVG-OM  . History of DVT (deep vein thrombosis)     chronic coumadin, pulmonary embolus 2000 and 2005, life long  . Chronic anticoagulation   . Amaurosis fugax 2013  . Goiter 2013  . Melanoma     left flank, [facility] Sabal    Current Outpatient Prescriptions  Medication Sig Dispense Refill  . aspirin 81 MG tablet Take 81 mg by mouth daily.      Marland Kitchen atorvastatin (LIPITOR) 40 MG tablet Take 40 mg by mouth daily.      Marland Kitchen ezetimibe (ZETIA) 10 MG tablet Take 10 mg by mouth daily.      . metoprolol tartrate (LOPRESSOR) 25 MG tablet Take 12.5 mg by mouth 2 (two) times daily.      Marland Kitchen warfarin (COUMADIN) 5 MG tablet 2.5 mg T/Th/Sun and 5 mg all other days       No current facility-administered medications for this visit.    Allergies:   No Known Allergies  Social History:  The patient   reports that he has never smoked. He does not have any smokeless tobacco history on file. He reports that he drinks about .5 - 1 ounces of alcohol per week.   Family History:  The patient's family history includes Leukemia in his mother.   ROS:  Please see the history of present illness.  No nausea, vomiting.  No fevers, chills.  No focal weakness.  No dysuria.    All other systems reviewed and negative.   PHYSICAL EXAM: VS:  BP 132/64  Pulse 63  Ht 6' (1.829 m)  Wt 207 lb 3.2 oz (93.985 kg)  BMI 28.10 kg/m2 Well nourished, well developed, in no acute distress HEENT: normal Neck: no JVD, no carotid bruits Cardiac:  normal S1, S2; RRR;  Lungs:  clear to auscultation bilaterally, no wheezing, rhonchi or rales Abd: soft, nontender, no hepatomegaly Ext: no edema Skin: warm and dry Neuro:   no focal abnormalities noted  EKG:  NSR, premature beats     ASSESSMENT AND PLAN:  Coronary atherosclerosis of native coronary artery  IMAGING: EKG    Overton,Shana 02/17/2013 09:08:32 AM > Maritza Goldsborough,JAY 02/17/2013 09:42:17 AM >  NSR with PACs.   Notes: Single-vessel bypass in 2009. SVG to OM. No angina.    2. Hyperlipidemia  Continue Lipitor Tablet, 40 MG, 1 tablet, Orally, Once a day Continue Zetia Tablet, 10 MG, 1 tablet, Orally, Once a day  LDL 78 in 2014. LDL 79 in 1/15   3. Other pulmonary embolism and infarction  Continue Warfarin Sodium Tablet, 5 MG, 5 mg qd except 2.5 mg T/Th/Sun, Orally, as directed  He would like to switch to a NOAC for DVT/PE.  He will check which is cheaper, Eliquis or Xarelto.    4. Mitral valve disorders  Notes: Status post mitral valve repair in 2009.    5. Heart valve replaced by other means  Notes: Status post tissue valve to the aortic position and 2009.    6. OK to get flu shot.  Signed, Mina Marble, MD, [facility] 02/25/2014 8:59 AM

## 2014-02-25 NOTE — Patient Instructions (Signed)
Xarelto Vs. Eliquis- Check on cost and let us know.  Your physician recommends that you continue on your current medications as directed. Please refer to the Current Medication list given to you today.  Your physician wants you to follow-up in: 1 year with Dr. Irish Lack. You will receive a reminder letter in the mail two months in advance. If you don't receive a letter, please call our office to schedule the follow-up appointment.

## 2014-03-19 DIAGNOSIS — H26491 Other secondary cataract, right eye: Secondary | ICD-10-CM | POA: Diagnosis not present

## 2014-03-23 DIAGNOSIS — L814 Other melanin hyperpigmentation: Secondary | ICD-10-CM | POA: Diagnosis not present

## 2014-03-23 DIAGNOSIS — Z08 Encounter for follow-up examination after completed treatment for malignant neoplasm: Secondary | ICD-10-CM | POA: Diagnosis not present

## 2014-03-23 DIAGNOSIS — L570 Actinic keratosis: Secondary | ICD-10-CM | POA: Diagnosis not present

## 2014-03-23 DIAGNOSIS — Z85820 Personal history of malignant melanoma of skin: Secondary | ICD-10-CM | POA: Diagnosis not present

## 2014-03-28 ENCOUNTER — Encounter: Payer: Self-pay | Admitting: *Deleted

## 2014-03-29 ENCOUNTER — Ambulatory Visit (INDEPENDENT_AMBULATORY_CARE_PROVIDER_SITE_OTHER): Payer: Medicare Other

## 2014-03-29 DIAGNOSIS — I2699 Other pulmonary embolism without acute cor pulmonale: Secondary | ICD-10-CM | POA: Diagnosis not present

## 2014-03-29 LAB — POCT INR: INR: 2.5

## 2014-03-30 ENCOUNTER — Telehealth: Payer: Self-pay | Admitting: *Deleted

## 2014-03-30 NOTE — Telephone Encounter (Signed)
Pt had extraction of one tooth today and Melissa from Dr Randol Kern office  states he is having bleeding from extraction and she is asking if needs to hold coumadin tonight. His INR was 2.5 yesterday and Melissa instructed that if he were to hold coumadin tonight his INR would not drop for approximately 2 days and did speak with our Pharmacist Dr Elberta Leatherwood and Lenna Sciara instructed to have continue same dose as today would be 1/2 tablet (2.5mg )  and he takes 5mg  daily except 2.5mg  on Sundays Tuesdays and Thursdays and Melissa states they are packing area and said will probably use gel as well and instructed to use tea bags and Melissa states understanding Also instructed to have pt call if bleeding continues and we will check his INR

## 2014-04-01 DIAGNOSIS — I4891 Unspecified atrial fibrillation: Secondary | ICD-10-CM | POA: Diagnosis not present

## 2014-04-01 DIAGNOSIS — I499 Cardiac arrhythmia, unspecified: Secondary | ICD-10-CM | POA: Diagnosis not present

## 2014-04-02 ENCOUNTER — Encounter: Payer: Self-pay | Admitting: *Deleted

## 2014-04-02 ENCOUNTER — Telehealth: Payer: Self-pay | Admitting: Interventional Cardiology

## 2014-04-02 ENCOUNTER — Ambulatory Visit (INDEPENDENT_AMBULATORY_CARE_PROVIDER_SITE_OTHER): Payer: Medicare Other | Admitting: Nurse Practitioner

## 2014-04-02 ENCOUNTER — Encounter: Payer: Self-pay | Admitting: Nurse Practitioner

## 2014-04-02 ENCOUNTER — Other Ambulatory Visit: Payer: Self-pay | Admitting: Nurse Practitioner

## 2014-04-02 VITALS — BP 112/70 | HR 134 | Ht 72.0 in | Wt 206.6 lb

## 2014-04-02 DIAGNOSIS — I4892 Unspecified atrial flutter: Secondary | ICD-10-CM | POA: Insufficient documentation

## 2014-04-02 DIAGNOSIS — I350 Nonrheumatic aortic (valve) stenosis: Secondary | ICD-10-CM

## 2014-04-02 DIAGNOSIS — I38 Endocarditis, valve unspecified: Secondary | ICD-10-CM | POA: Diagnosis not present

## 2014-04-02 DIAGNOSIS — I483 Typical atrial flutter: Secondary | ICD-10-CM | POA: Diagnosis not present

## 2014-04-02 DIAGNOSIS — I2510 Atherosclerotic heart disease of native coronary artery without angina pectoris: Secondary | ICD-10-CM | POA: Diagnosis not present

## 2014-04-02 NOTE — Progress Notes (Signed)
Patient Name: JANE BROUGHTON Date of Encounter: 04/02/2014  Primary Care Provider:  Irven Shelling, MD Primary Cardiologist:  Lendell Caprice, MD   Patient Profile  80 year old male with prior history of CAD, aortic valve replacement, and mitral valve repair, who presents secondary to new onset of atrial flutter.  Problem List   Past Medical History  Diagnosis Date  . Adenocarcinoma of prostate   . Dyslipidemia   . Aortic stenosis 06/19/2007    a. s/p AVR, tissue type;  b. 04/2013 Echo: EF 60-65%, mild AI, mild MR, mild-mod dil LA, mildly dil RA.  Marland Kitchen Hx of valvuloplasty 06/19/2007    a. s/p mitral ring, repair P2 MV  . Single vessel coronary artery disease 06/19/2007    a. 05/2007 CABG x 1: s/p SVG-OM;  b. 10/2010 Ex MV: EF 72%, inf attenuation w/o ischemia, brief run of PAT with exercise. To  . History of DVT (deep vein thrombosis)     a. chronic coumadin, pulmonary embolus 2000 and 2005, life long  . Chronic anticoagulation     a. coumadin - followed in [facility].  . Amaurosis fugax 2013  . Goiter 2013  . Melanoma     left flank, [place]  . Atrial flutter     a. Dx 03/2014   Past Surgical History  Procedure Laterality Date  . Corrective eye surgery as a child    . Left rotator cuff surgery  5/00  . Avr, mitro- flow, 23 mm, with [id number]  1/09  . S/p radical retropubic prostatectomy,  00  . Left eye cataract    . Left side melanoma    . Right eye catraract  2009    Allergies  No Known Allergies  HPI  80 year old male with prior history of coronary artery disease status post coronary artery bypass grafting 1 in the setting of bioprosthetic aortic valve replacement, and mitral valve ring/repair in 2005. He has done well over the years and has remained relatively active.he is on chronic Coumadin the setting of prior history of DVT and pulmonary embolus. His INRs followed closely in our Coumadin clinic. He recently had some issues with a molar   Requiring extraction on Monday November 9. His INR was 2.5 earlier that day and he had significant bleeding following extraction. He otherwise was asymptomatic. On this past Wednesday, his wife checked his blood pressure and noted a heart rate in the 130s. This was new for him. He was a systematic. He had recurrent bleeding of this area of tooth extraction and he was seen by dental surgery last night and have the area packed. He has remained on Coumadin throughout. He has had no further bleeding. His heart rate however has remained elevated in the 130s with stable blood pressures. He has remained asymptomatic. He was seen by his primary care doctor yesterday and was diagnosed with atrial fibrillation. His beta blocker was adjusted to 25 mg twice a day. His wife called today and arrange to be seen here. He is currently in atrial flutter with 2:1 conduction at a rate of 135. He is completely asymptomatic. He denies chest pain, palpitations, PND, orthopnea, dizziness, syncope, edema, or early satiety. He's had no further bleeding in his mouth.  Home Medications  Prior to Admission medications   Medication Sig Start Date End Date Taking? Authorizing Provider  aspirin 81 MG tablet Take 81 mg by mouth daily.   Yes Historical Provider, MD  atorvastatin (LIPITOR) 40 MG tablet Take 40 mg  by mouth daily.   Yes Historical Provider, MD  ezetimibe (ZETIA) 10 MG tablet Take 10 mg by mouth daily.   Yes Historical Provider, MD  metoprolol tartrate (LOPRESSOR) 25 MG tablet Take 25 mg by mouth 2 (two) times daily.    Yes Historical Provider, MD  warfarin (COUMADIN) 5 MG tablet 2.5 mg T/Th/Sun and 5 mg all other days   Yes Historical Provider, MD    Family History  Family History  Problem Relation Age of Onset  . Leukemia Mother     Social History  History   Social History  . Marital Status: Married    Spouse Name: N/A    Number of Children: N/A  . Years of Education: N/A   Occupational History  . Not on  file.   Social History Main Topics  . Smoking status: Never Smoker   . Smokeless tobacco: Not on file  . Alcohol Use: 0.5 - 1.0 oz/week    1-2 drink(s) per week  . Drug Use: Not on file  . Sexual Activity: Not on file   Other Topics Concern  . Not on file   Social History Narrative   Lives in [place] with wife.  Active.     Review of Systems General:  No chills, fever, night sweats or weight changes.  Cardiovascular:  No chest pain, dyspnea on exertion, edema, orthopnea, palpitations, paroxysmal nocturnal dyspnea. HEENT: Notable for recent oral bleeding status post tooth extraction. Dermatological: No rash, lesions/masses Respiratory: No cough, dyspnea Urologic: No hematuria, dysuria Abdominal:   No nausea, vomiting, diarrhea, bright red blood per rectum, melena, or hematemesis Neurologic:  No visual changes, wkns, changes in mental status. All other systems reviewed and are otherwise negative except as noted above.  Physical Exam  Blood pressure 112/70, pulse 134, height 6' (1.829 m), weight 206 lb 9.6 oz (93.713 kg).  General: Pleasant, NAD Psych: Normal affect. Neuro: Alert and oriented X 3. Moves all extremities spontaneously. HEENT: Normal  Neck: Supple without bruits or JVD. Lungs:  Resp regular and unlabored, CTA. Heart: RRR tachycardic, no s3, s4, or murmurs. Abdomen: Soft, non-tender, non-distended, BS + x 4.  Extremities: No clubbing, cyanosis or edema. DP/PT/Radials 2+ and equal bilaterally.  Accessory Clinical Findings  ECG - atrial flutter with 21 conduction at a rate of 135. Left axis deviation. Nonspecific ST and T changes.  Assessment & Plan  1.  Atrial flutter with rapid ventricular response: Patient was first noted to have elevated heart rate by his wife 2 days ago. He has been completely asymptomatic without chest pain, palpitations, dyspnea, or presyncope. Exact duration of atrial flutter is unknown. Fortunately, he is anticoagulated with Coumadin and  INR was 2.5 on November 9. Unfortunately, INR was 1.9 on October 8 and thus we cannot be sure that he has had a therapeutic INR over the past month. I have discussed this case with Dr. Mare Ferrari today. As he is asymptomatic, there is not a reason for hospitalization at this point. We will increase his beta blocker to 50 mg twice a day and have arranged for a TEE and cardioversion on Tuesday, November 17 at 9 AM with Dr. Meda Coffee. He had labs drawn by his primary care provider yesterday including a CBC, bmet, TSH, and INR. We have requested those records and hope to have him on file for the end of the day today. Patient is aware that if he develops any symptoms whatsoever, he is to call 911 and present to the ED. Provided  that cardioversion is successful, we can consider electrophysiology outpatient evaluation in the future.  2. Coronary artery disease: Despite elevated heart rates, he has not been having any chest pain or dyspnea. He remains on low-dose aspirin, statin, Zetia, and beta blocker therapy.  3. Valvular heart disease: Status post bioprosthetic aVR and mitral valve ring and repair. We can reevaluate his valves with TEE on Tuesday. Last echo showed normal valvular function December 2014.  4. History of DVT and PE: He is on chronic Coumadin. INR was 2.5 and November 9. He is currently on antibiotics in the setting of recent tooth extraction. He had repeat INR yesterday and we are awaiting this result.  5. Disposition: TEE and cardioversion next Tuesday. Follow up with Dr. Irish Lack in approximately 2 weeks.    Murray Hodgkins, NP 04/02/2014, 4:40 PM

## 2014-04-02 NOTE — Telephone Encounter (Signed)
New message  Pt wife called states that per Dr. Delfina Redwood the pt's  pulse is too high.. Dr. Delfina Redwood doubled the metroprolol..  Even though it was doubled his heart rate is still high It is now at 133.. Will the patient need an urgent appt with Dr. Irish Lack

## 2014-04-02 NOTE — Telephone Encounter (Signed)
I called and spoke with the patient's wife. She reports that on Monday this week, the patient had an INR check- 2.5. Tuesday, he had a tooth extraction and bleeding post procedure that lasted until Wednesday. The patient's wife decided to check his BP since he was bleeding and noticed on Wednesday that is HR was 137 bpm. She called the PCP on call and was told to hydrate the patient, which they tried to do. This was difficult with the amount of bleeding in the mouth. On Thursday, the patient's HR was still elevated. He typically sees Dr. Laurann Montana, but was added on to Dr. Lina Sar schedule for evaluation in Dr. Delene Ruffini abscence. An EKG was done and they were told the patient was in a-fib. He has never had this documented before. Dr. Delfina Redwood increased the patient's metoprolol from a 1/2 a pill twice daily to a whole pill twice daily. The patient ended back at the oral surgeons office last night after a clot broke free in his mouth and bleeding resumed. He was packed and had stitches placed. At 9:30 pm last night the patient's HR was 110 bpm. At 7:00 am, he was 133 bpm and BP- 103/67. At 12:00 pm he was 133 bpm and BP- 130/79. I reviewed with Ignacia Bayley, NP. He will see the patient today at 3:00 pm. The patient's wife is aware and very appreciative.

## 2014-04-02 NOTE — Patient Instructions (Signed)
Your physician has recommended you make the following change in your medication:    START TAKING 50 MG OF METOPROLOL TWICE A DAY     Your physician has requested that you have a TEE. During a TEE, sound waves are used to create images of your heart. It provides your doctor with information about the size and shape of your heart and how well your heart's chambers and valves are working. In this test, a transducer is attached to the end of a flexible tube that's guided down your throat and into your esophagus (the tube leading from you mouth to your stomach) to get a more detailed image of your heart. You are not awake for the procedure. Please see the instruction sheet given to you today. For further information please visit [email or web address].  Your physician has recommended that you have a Cardioversion (DCCV). Electrical Cardioversion uses a jolt of electricity to your heart either through paddles or wired patches attached to your chest. This is a controlled, usually prescheduled, procedure. Defibrillation is done under light anesthesia in the hospital, and you usually go home the day of the procedure. This is done to get your heart back into a normal rhythm. You are not awake for the procedure. Please see the instruction sheet given to you today.

## 2014-04-05 ENCOUNTER — Other Ambulatory Visit (HOSPITAL_COMMUNITY): Payer: Medicare Other

## 2014-04-06 ENCOUNTER — Encounter (HOSPITAL_COMMUNITY)
Admission: RE | Disposition: A | Discharge status: Home / Self Care | Payer: Self-pay | Source: Ambulatory Visit | Attending: Cardiology

## 2014-04-06 ENCOUNTER — Ambulatory Visit (HOSPITAL_COMMUNITY): Payer: Medicare Other | Admitting: Anesthesiology

## 2014-04-06 ENCOUNTER — Ambulatory Visit (HOSPITAL_COMMUNITY)
Admission: RE | Admit: 2014-04-06 | Discharge: 2014-04-06 | Disposition: A | Discharge status: Home / Self Care | Payer: Medicare Other | Source: Ambulatory Visit | Attending: Cardiology | Admitting: Cardiology

## 2014-04-06 ENCOUNTER — Encounter (HOSPITAL_COMMUNITY): Payer: Self-pay | Admitting: Gastroenterology

## 2014-04-06 DIAGNOSIS — I4892 Unspecified atrial flutter: Secondary | ICD-10-CM | POA: Diagnosis not present

## 2014-04-06 DIAGNOSIS — Z85820 Personal history of malignant melanoma of skin: Secondary | ICD-10-CM | POA: Diagnosis not present

## 2014-04-06 DIAGNOSIS — Z86711 Personal history of pulmonary embolism: Secondary | ICD-10-CM | POA: Insufficient documentation

## 2014-04-06 DIAGNOSIS — Z86718 Personal history of other venous thrombosis and embolism: Secondary | ICD-10-CM | POA: Insufficient documentation

## 2014-04-06 DIAGNOSIS — Z7982 Long term (current) use of aspirin: Secondary | ICD-10-CM | POA: Diagnosis not present

## 2014-04-06 DIAGNOSIS — Z952 Presence of prosthetic heart valve: Secondary | ICD-10-CM | POA: Insufficient documentation

## 2014-04-06 DIAGNOSIS — I2510 Atherosclerotic heart disease of native coronary artery without angina pectoris: Secondary | ICD-10-CM | POA: Insufficient documentation

## 2014-04-06 DIAGNOSIS — I483 Typical atrial flutter: Secondary | ICD-10-CM | POA: Diagnosis not present

## 2014-04-06 DIAGNOSIS — I341 Nonrheumatic mitral (valve) prolapse: Secondary | ICD-10-CM | POA: Diagnosis not present

## 2014-04-06 DIAGNOSIS — E785 Hyperlipidemia, unspecified: Secondary | ICD-10-CM | POA: Diagnosis not present

## 2014-04-06 DIAGNOSIS — Z7901 Long term (current) use of anticoagulants: Secondary | ICD-10-CM | POA: Insufficient documentation

## 2014-04-06 DIAGNOSIS — Z951 Presence of aortocoronary bypass graft: Secondary | ICD-10-CM | POA: Insufficient documentation

## 2014-04-06 DIAGNOSIS — I4891 Unspecified atrial fibrillation: Secondary | ICD-10-CM | POA: Insufficient documentation

## 2014-04-06 HISTORY — PX: TEE WITHOUT CARDIOVERSION: SHX5443

## 2014-04-06 HISTORY — PX: CARDIOVERSION: SHX1299

## 2014-04-06 LAB — POCT I-STAT 4, (NA,K, GLUC, HGB,HCT)
GLUCOSE: 91 mg/dL (ref 70–99)
HCT: 43 % (ref 39.0–52.0)
Hemoglobin: 14.6 g/dL (ref 13.0–17.0)
Potassium: 4.2 mEq/L (ref 3.7–5.3)
Sodium: 139 mEq/L (ref 137–147)

## 2014-04-06 LAB — PROTIME-INR
INR: 2.64 — ABNORMAL HIGH (ref 0.00–1.49)
Prothrombin Time: 28.4 seconds — ABNORMAL HIGH (ref 11.6–15.2)

## 2014-04-06 SURGERY — ECHOCARDIOGRAM, TRANSESOPHAGEAL
Anesthesia: Monitor Anesthesia Care

## 2014-04-06 MED ORDER — SODIUM CHLORIDE 0.9 % IV SOLN: INTRAVENOUS | Status: DC

## 2014-04-06 MED ORDER — LACTATED RINGERS IV SOLN
INTRAVENOUS | Status: DC
  Administered 2014-04-06: 1000 mL via INTRAVENOUS

## 2014-04-06 MED ORDER — BUTAMBEN-TETRACAINE-BENZOCAINE 2-2-14 % EX AERO
INHALATION_SPRAY | CUTANEOUS | Status: DC | PRN
  Administered 2014-04-06: 2 via TOPICAL

## 2014-04-06 MED ORDER — PROPOFOL 10 MG/ML IV BOLUS
INTRAVENOUS | Status: DC | PRN
  Administered 2014-04-06: 20 mg via INTRAVENOUS
  Administered 2014-04-06: 10 mg via INTRAVENOUS

## 2014-04-06 MED ORDER — LACTATED RINGERS IV SOLN
INTRAVENOUS | Status: DC | PRN
  Administered 2014-04-06: 14:00:00 via INTRAVENOUS

## 2014-04-06 MED ORDER — PROPOFOL INFUSION 10 MG/ML OPTIME
INTRAVENOUS | Status: DC | PRN
  Administered 2014-04-06: 25 ug/kg/min via INTRAVENOUS

## 2014-04-06 NOTE — Anesthesia Preprocedure Evaluation (Addendum)
Anesthesia Evaluation  Patient identified by MRN, date of birth, ID band Patient awake    Reviewed: Allergy & Precautions, H&P , NPO status , Patient's Chart, lab work & pertinent test results  Airway        Dental   Pulmonary          Cardiovascular + CAD + dysrhythmias Atrial Fibrillation + Valvular Problems/Murmurs AS     Neuro/Psych    GI/Hepatic   Endo/Other    Renal/GU      Musculoskeletal   Abdominal   Peds  Hematology   Anesthesia Other Findings   Reproductive/Obstetrics                             Anesthesia Physical Anesthesia Plan  ASA: III  Anesthesia Plan: MAC and General   Post-op Pain Management:    Induction: Intravenous  Airway Management Planned: Mask  Additional Equipment:   Intra-op Plan:   Post-operative Plan:   Informed Consent: I have reviewed the patients History and Physical, chart, labs and discussed the procedure including the risks, benefits and alternatives for the proposed anesthesia with the patient or authorized representative who has indicated his/her understanding and acceptance.     Plan Discussed with: CRNA, Anesthesiologist and Surgeon  Anesthesia Plan Comments:         Anesthesia Quick Evaluation

## 2014-04-06 NOTE — Anesthesia Postprocedure Evaluation (Signed)
  Anesthesia Post-op Note  Patient: Eric [name]  Procedure(s) Performed: Procedure(s): TRANSESOPHAGEAL ECHOCARDIOGRAM (TEE) (N/A) CARDIOVERSION (N/A)  Patient Location: PACU  Anesthesia Type:General  Level of Consciousness: awake, oriented, sedated and patient cooperative  Airway and Oxygen Therapy: Patient Spontanous Breathing  Post-op Pain: none  Post-op Assessment: Post-op Vital signs reviewed, Patient's Cardiovascular Status Stable, Respiratory Function Stable, Patent Airway and No signs of Nausea or vomiting  Post-op Vital Signs: stable  Last Vitals:  Filed Vitals:   04/06/14 1312  BP: 133/89  Pulse: 136  Temp: 36.6 C  Resp: 19    Complications: No apparent anesthesia complications

## 2014-04-06 NOTE — Discharge Instructions (Signed)
Transesophageal Echocardiogram °Transesophageal echocardiography (TEE) is a picture test of your heart using sound waves. The pictures taken can give very detailed pictures of your heart. This can help your doctor see if there are problems with your heart. TEE can check: °· If your heart has blood clots in it. °· How well your heart valves are working. °· If you have an infection on the inside of your heart. °· Some of the major arteries of your heart. °· If your heart valve is working after a repair. °· Your heart before a procedure that uses a shock to your heart to get the rhythm back to normal. °BEFORE THE PROCEDURE °· Do not eat or drink for 6 hours before the procedure or as told by your doctor. °· Make plans to have someone drive you home after the procedure. Do not drive yourself home. °· An IV tube will be put in your arm. °PROCEDURE °· You will be given a medicine to help you relax (sedative). It will be given through the IV tube. °· A numbing medicine will be sprayed or gargled in the back of your throat to help numb it. °· The tip of the probe is placed into the back of your mouth. You will be asked to swallow. This helps to pass the probe into your esophagus. °· Once the tip of the probe is in the right place, your doctor can take pictures of your heart. °· You may feel pressure at the back of your throat. °AFTER THE PROCEDURE °· You will be taken to a recovery area so the sedative can wear off. °· Your throat may be sore and scratchy. This will go away slowly over time. °· You will go home when you are fully awake and able to swallow liquids. °· You should have someone stay with you for the next 24 hours. °· Do not drive or operate machinery for the next 24 hours. °Document Released: 03/04/2009 Document Revised: 05/12/2013 Document Reviewed: 11/06/2012 °[employer]® Patient Information ©2015 [employer]. This information is not intended to replace advice given to you by your health care provider. Make  sure you discuss any questions you have with your health care provider. ° °Electrical Cardioversion, Care After °Refer to this sheet in the next few weeks. These instructions provide you with information on caring for yourself after your procedure. Your health care provider may also give you more specific instructions. Your treatment has been planned according to current medical practices, but problems sometimes occur. Call your health care provider if you have any problems or questions after your procedure. °WHAT TO EXPECT AFTER THE PROCEDURE °After your procedure, it is typical to have the following sensations: °· Some redness on the skin where the shocks were delivered. If this is tender, a sunburn lotion or hydrocortisone cream may help. °· Possible return of an abnormal heart rhythm within hours or days after the procedure. °HOME CARE INSTRUCTIONS °· Take medicines only as directed by your health care provider. Be sure you understand how and when to take your medicine. °· Learn how to feel your pulse and check it often. °· Limit your activity for 48 hours after the procedure or as directed by your health care provider. °· Avoid or minimize caffeine and other stimulants as directed by your health care provider. °SEEK MEDICAL CARE IF: °· You feel like your heart is beating too fast or your pulse is not regular. °· You have any questions about your medicines. °· You have bleeding that   will not stop. °SEEK IMMEDIATE MEDICAL CARE IF: °· You are dizzy or feel faint. °· It is hard to breathe or you feel short of breath. °· There is a change in discomfort in your chest. °· Your speech is slurred or you have trouble moving an arm or leg on one side of your body. °· You get a serious muscle cramp that does not go away. °· Your fingers or toes turn cold or blue. °Document Released: 02/25/2013 Document Revised: 09/21/2013 Document Reviewed: 02/25/2013 °[employer]® Patient Information ©2015 [employer]. This information is  not intended to replace advice given to you by your health care provider. Make sure you discuss any questions you have with your health care provider. ° °

## 2014-04-06 NOTE — CV Procedure (Signed)
     Transesophageal Echocardiogram Note  PELHAM HENNICK [id number] 1930-01-02  Procedure: Transesophageal Echocardiogram Indications: atrial flutter  Procedure Details Consent: Obtained Time Out: Verified patient identification, verified procedure, site/side was marked, verified correct patient position, special equipment/implants available, Radiology Safety Procedures followed,  medications/allergies/relevent history reviewed, required imaging and test results available.  Performed  Medications: Propofol 20 mg Lidocain 40 mg  Left Ventrical:  The cavity size was normal. There was mild focal basal hypertrophy of the septum. Systolic function was normal. The estimated ejection fraction was in the range of 50-55%. Wall motion was normal; there were no regional wall motion abnormalities.  Mitral Valve: S/P annuloplasty ring, elongated chordae, thickened leaflets, mild to moderate MR, multiple small jets.  Aortic Valve: A bioprosthetic valve sits well in the aortic position. Mild AI.   Tricuspid Valve: Thickened valve. Mild TR. RVSP normal.  Pulmonic Valve: No PR.  Left Atrium/ Left atrial appendage: Dilated LA, no thrombus in LA or LAA.   Atrial septum: No ASD or PFO.  Aorta: Mild non-mobile atherosclerotic plaque.  Complications: No apparent complications Patient did tolerate procedure well.    Cardioversion Note  [name] [id number] [birth year]  Procedure: DC Cardioversion Indications: atrial flutter  Procedure Details Consent: Obtained Time Out: Verified patient identification, verified procedure, site/side was marked, verified correct patient position, special equipment/implants available, Radiology Safety Procedures followed,  medications/allergies/relevent history reviewed, required imaging and test results available.  Performed  The patient has been on adequate anticoagulation.  The patient received IV Propofol 20 mg, Lidocain 40 mg for  sedation. Synchronous cardioversion was performed at 120 joules.  The cardioversion was successful.   Complications: No apparent complications Patient did tolerate procedure well.   [name], MD, [facility] 04/06/2014, 7:58 AM

## 2014-04-06 NOTE — Interval H&P Note (Signed)
History and Physical Interval Note:  04/06/2014 7:56 AM  Eric [name]  has presented today for surgery, with the diagnosis of Aflutter  The various methods of treatment have been discussed with the patient and family. After consideration of risks, benefits and other options for treatment, the patient has consented to  Procedure(s): TRANSESOPHAGEAL ECHOCARDIOGRAM (TEE) (N/A) CARDIOVERSION (N/A) as a surgical intervention .  The patient's history has been reviewed, patient examined, no change in status, stable for surgery.  I have reviewed the patient's chart and labs.  Questions were answered to the patient's satisfaction.     [name]

## 2014-04-06 NOTE — Progress Notes (Signed)
Echocardiogram Echocardiogram Transesophageal has been performed.  Joelene Millin 04/06/2014, 3:47 PM

## 2014-04-06 NOTE — Anesthesia Procedure Notes (Signed)
Procedure Name: MAC Date/Time: 04/06/2014 2:30 PM Performed by: Garrison Columbus T Pre-anesthesia Checklist: Patient identified, Emergency Drugs available, Suction available and Patient being monitored Patient Re-evaluated:Patient Re-evaluated prior to inductionOxygen Delivery Method: Simple face mask Preoxygenation: Pre-oxygenation with 100% oxygen Intubation Type: IV induction Placement Confirmation: positive ETCO2 Dental Injury: Teeth and Oropharynx as per pre-operative assessment

## 2014-04-06 NOTE — Transfer of Care (Signed)
Immediate Anesthesia Transfer of Care Note  Patient: Eric [name]  Procedure(s) Performed: Procedure(s): TRANSESOPHAGEAL ECHOCARDIOGRAM (TEE) (N/A) CARDIOVERSION (N/A)  Patient Location: Endoscopy Unit  Anesthesia Type:MAC and General  Level of Consciousness: awake, alert  and oriented  Airway & Oxygen Therapy: Patient Spontanous Breathing and Patient connected to nasal cannula oxygen  Post-op Assessment: Report given to PACU RN, Post -op Vital signs reviewed and stable and Patient moving all extremities X 4  Post vital signs: Reviewed and stable  Complications: No apparent anesthesia complications

## 2014-04-06 NOTE — H&P (View-Only) (Signed)
Patient Name: Eric [name] Date of Encounter: 04/02/2014  Primary Care Provider:  Irven Shelling, MD Primary Cardiologist:  Lendell Caprice, MD   Patient Profile  80 year old male with prior history of CAD, aortic valve replacement, and mitral valve repair, who presents secondary to new onset of atrial flutter.  Problem List   Past Medical History  Diagnosis Date  . Adenocarcinoma of prostate   . Dyslipidemia   . Aortic stenosis 06/19/2007    a. s/p AVR, tissue type;  b. 04/2013 Echo: EF 60-65%, mild AI, mild MR, mild-mod dil LA, mildly dil RA.  Marland Kitchen Hx of valvuloplasty 06/19/2007    a. s/p mitral ring, repair P2 MV  . Single vessel coronary artery disease 06/19/2007    a. 05/2007 CABG x 1: s/p SVG-OM;  b. 10/2010 Ex MV: EF 72%, inf attenuation w/o ischemia, brief run of PAT with exercise. To  . History of DVT (deep vein thrombosis)     a. chronic coumadin, pulmonary embolus 2000 and 2005, life long  . Chronic anticoagulation     a. coumadin - followed in [place] coumadin clinic.  . Amaurosis fugax 2013  . Goiter 2013  . Melanoma     left flank, [place]  . Atrial flutter     a. Dx 03/2014   Past Surgical History  Procedure Laterality Date  . Corrective eye surgery as a child    . Left rotator cuff surgery  5/00  . Avr, mitro- flow, 23 mm, with [id number]  1/09  . S/p radical retropubic prostatectomy,  00  . Left eye cataract    . Left side melanoma    . Right eye catraract  2009    Allergies  No Known Allergies  HPI  80 year old male with prior history of coronary artery disease status post coronary artery bypass grafting 1 in the setting of bioprosthetic aortic valve replacement, and mitral valve ring/repair in 2005. He has done well over the years and has remained relatively active.he is on chronic Coumadin the setting of prior history of DVT and pulmonary embolus. His INRs followed closely in our Coumadin clinic. He recently had some issues with a molar   Requiring extraction on Monday November 9. His INR was 2.5 earlier that day and he had significant bleeding following extraction. He otherwise was asymptomatic. On this past Wednesday, his wife checked his blood pressure and noted a heart rate in the 130s. This was new for him. He was a systematic. He had recurrent bleeding of this area of tooth extraction and he was seen by dental surgery last night and have the area packed. He has remained on Coumadin throughout. He has had no further bleeding. His heart rate however has remained elevated in the 130s with stable blood pressures. He has remained asymptomatic. He was seen by his primary care doctor yesterday and was diagnosed with atrial fibrillation. His beta blocker was adjusted to 25 mg twice a day. His wife called today and arrange to be seen here. He is currently in atrial flutter with 2:1 conduction at a rate of 135. He is completely asymptomatic. He denies chest pain, palpitations, PND, orthopnea, dizziness, syncope, edema, or early satiety. He's had no further bleeding in his mouth.  Home Medications  Prior to Admission medications   Medication Sig Start Date End Date Taking? Authorizing Provider  aspirin 81 MG tablet Take 81 mg by mouth daily.   Yes Historical Provider, MD  atorvastatin (LIPITOR) 40 MG tablet Take 40 mg  by mouth daily.   Yes Historical Provider, MD  ezetimibe (ZETIA) 10 MG tablet Take 10 mg by mouth daily.   Yes Historical Provider, MD  metoprolol tartrate (LOPRESSOR) 25 MG tablet Take 25 mg by mouth 2 (two) times daily.    Yes Historical Provider, MD  warfarin (COUMADIN) 5 MG tablet 2.5 mg T/Th/Sun and 5 mg all other days   Yes Historical Provider, MD    Family History  Family History  Problem Relation Age of Onset  . Leukemia Mother     Social History  History   Social History  . Marital Status: Married    Spouse Name: N/A    Number of Children: N/A  . Years of Education: N/A   Occupational History  . Not on  file.   Social History Main Topics  . Smoking status: Never Smoker   . Smokeless tobacco: Not on file  . Alcohol Use: 0.5 - 1.0 oz/week    1-2 drink(s) per week  . Drug Use: Not on file  . Sexual Activity: Not on file   Other Topics Concern  . Not on file   Social History Narrative   Lives in [place] with wife.  Active.     Review of Systems General:  No chills, fever, night sweats or weight changes.  Cardiovascular:  No chest pain, dyspnea on exertion, edema, orthopnea, palpitations, paroxysmal nocturnal dyspnea. HEENT: Notable for recent oral bleeding status post tooth extraction. Dermatological: No rash, lesions/masses Respiratory: No cough, dyspnea Urologic: No hematuria, dysuria Abdominal:   No nausea, vomiting, diarrhea, bright red blood per rectum, melena, or hematemesis Neurologic:  No visual changes, wkns, changes in mental status. All other systems reviewed and are otherwise negative except as noted above.  Physical Exam  Blood pressure 112/70, pulse 134, height 6' (1.829 m), weight 206 lb 9.6 oz (93.713 kg).  General: Pleasant, NAD Psych: Normal affect. Neuro: Alert and oriented X 3. Moves all extremities spontaneously. HEENT: Normal  Neck: Supple without bruits or JVD. Lungs:  Resp regular and unlabored, CTA. Heart: RRR tachycardic, no s3, s4, or murmurs. Abdomen: Soft, non-tender, non-distended, BS + x 4.  Extremities: No clubbing, cyanosis or edema. DP/PT/Radials 2+ and equal bilaterally.  Accessory Clinical Findings  ECG - atrial flutter with 21 conduction at a rate of 135. Left axis deviation. Nonspecific ST and T changes.  Assessment & Plan  1.  Atrial flutter with rapid ventricular response: Patient was first noted to have elevated heart rate by his wife 2 days ago. He has been completely asymptomatic without chest pain, palpitations, dyspnea, or presyncope. Exact duration of atrial flutter is unknown. Fortunately, he is anticoagulated with Coumadin and  INR was 2.5 on November 9. Unfortunately, INR was 1.9 on October 8 and thus we cannot be sure that he has had a therapeutic INR over the past month. I have discussed this case with Dr. Mare Ferrari today. As he is asymptomatic, there is not a reason for hospitalization at this point. We will increase his beta blocker to 50 mg twice a day and have arranged for a TEE and cardioversion on Tuesday, November 17 at 9 AM with Dr. Meda Coffee. He had labs drawn by his primary care provider yesterday including a CBC, bmet, TSH, and INR. We have requested those records and hope to have him on file for the end of the day today. Patient is aware that if he develops any symptoms whatsoever, he is to call 911 and present to the ED. Provided  that cardioversion is successful, we can consider electrophysiology outpatient evaluation in the future.  2. Coronary artery disease: Despite elevated heart rates, he has not been having any chest pain or dyspnea. He remains on low-dose aspirin, statin, Zetia, and beta blocker therapy.  3. Valvular heart disease: Status post bioprosthetic aVR and mitral valve ring and repair. We can reevaluate his valves with TEE on Tuesday. Last echo showed normal valvular function December 2014.  4. History of DVT and PE: He is on chronic Coumadin. INR was 2.5 and November 9. He is currently on antibiotics in the setting of recent tooth extraction. He had repeat INR yesterday and we are awaiting this result.  5. Disposition: TEE and cardioversion next Tuesday. Follow up with Dr. Irish Lack in approximately 2 weeks.    Murray Hodgkins, NP 04/02/2014, 4:40 PM

## 2014-04-07 ENCOUNTER — Encounter (HOSPITAL_COMMUNITY): Payer: Self-pay | Admitting: Cardiology

## 2014-04-13 ENCOUNTER — Telehealth: Payer: Self-pay | Admitting: Interventional Cardiology

## 2014-04-13 NOTE — Telephone Encounter (Signed)
Dr. Irish Lack-  Per Emeline Gins note- the patient needed to follow up with you 2 weeks post DCCV. He is calling now to schedule this for next week since it hasn't been done already. DCCV on 04/06/14- is it ok for me to work him in on your schedule next week and if so, do you have a preference where?

## 2014-04-13 NOTE — Telephone Encounter (Signed)
New problem    Pt has cardioversion and was told to call and sched f/u with Dr Irish Lack week after thanksgiving, b/c pt will be in town. Please advise pt.

## 2014-04-14 NOTE — Telephone Encounter (Signed)
Patient scheduled for Tuesday 04/20/14 at 4:15 pm. The patient's wife is aware.

## 2014-04-14 NOTE — Telephone Encounter (Signed)
How about Tuesday?

## 2014-04-20 ENCOUNTER — Ambulatory Visit (INDEPENDENT_AMBULATORY_CARE_PROVIDER_SITE_OTHER): Payer: Medicare Other | Admitting: Interventional Cardiology

## 2014-04-20 ENCOUNTER — Encounter: Payer: Self-pay | Admitting: Interventional Cardiology

## 2014-04-20 ENCOUNTER — Ambulatory Visit (INDEPENDENT_AMBULATORY_CARE_PROVIDER_SITE_OTHER): Payer: Medicare Other

## 2014-04-20 VITALS — BP 130/80 | HR 58 | Ht 72.0 in | Wt 209.1 lb

## 2014-04-20 DIAGNOSIS — I350 Nonrheumatic aortic (valve) stenosis: Secondary | ICD-10-CM | POA: Diagnosis not present

## 2014-04-20 DIAGNOSIS — I2510 Atherosclerotic heart disease of native coronary artery without angina pectoris: Secondary | ICD-10-CM | POA: Diagnosis not present

## 2014-04-20 DIAGNOSIS — I483 Typical atrial flutter: Secondary | ICD-10-CM

## 2014-04-20 DIAGNOSIS — I749 Embolism and thrombosis of unspecified artery: Secondary | ICD-10-CM | POA: Diagnosis not present

## 2014-04-20 DIAGNOSIS — I2699 Other pulmonary embolism without acute cor pulmonale: Secondary | ICD-10-CM

## 2014-04-20 LAB — POCT INR: INR: 3

## 2014-04-20 MED ORDER — APIXABAN 5 MG PO TABS: 5.0000 mg | ORAL_TABLET | Freq: Two times a day (BID) | ORAL | Status: DC

## 2014-04-20 NOTE — Progress Notes (Signed)
Patient ID: Eric [name], male   DOB: 10/03/1929, 80 y.o.   MRN: [id number] Patient ID: Eric [name], male   DOB: 09/16/1929, 80 y.o.   MRN: [id number]    [place], [address] [place], [place]  [address] Phone: [phone] Fax:  [phone]  Date:  04/20/2014   ID:  Eric [name], DOB [birth year], MRN [id number]  PCP:  Irven Shelling, MD      History of Present Illness: Eric [name] is a 80 y.o. male who has had an aortic valve replacement and single vessel bypass in 2009.HE had a negative stress test in 2011. No chest pain prior to valve replacement surgery. He did have SHOB.  He exercises a few days a week. He rides a bike for 30 minutes. He works as an Interior and spatial designer. He had melanoma surgery in the past.   Over a month ago, his heart rate was noted to be elevated. He was found to be in atrial flutter. He underwent cardioversion. He notes that during the time he was out of rhythm, he had a cough. This seems to be getting better after the cardioversion. Overall, he feels like he is improving.  He states he'll be going to [place] in February for about 3 weeks. Because of this, we had a long discussion about anticoagulation and monitoring of warfarin while he is overseas. He is willing to consider a NOAC.      Wt Readings from Last 3 Encounters:  04/20/14 209 lb 1.9 oz (94.856 kg)  04/06/14 200 lb (90.719 kg)  04/02/14 206 lb 9.6 oz (93.713 kg)     Past Medical History  Diagnosis Date  . Adenocarcinoma of prostate   . Dyslipidemia   . Aortic stenosis 06/19/2007    a. s/p AVR, tissue type;  b. 04/2013 Echo: EF 60-65%, mild AI, mild MR, mild-mod dil LA, mildly dil RA.  Marland Kitchen Hx of valvuloplasty 06/19/2007    a. s/p mitral ring, repair P2 MV  . Single vessel coronary artery disease 06/19/2007    a. 05/2007 CABG x 1: s/p SVG-OM;  b. 10/2010 Ex MV: EF 72%, inf attenuation w/o ischemia, brief run of PAT with exercise. To  . History of DVT (deep vein thrombosis)     a.  chronic coumadin, pulmonary embolus 2000 and 2005, life long  . Chronic anticoagulation     a. coumadin - followed in [place] coumadin clinic.  . Amaurosis fugax 2013  . Goiter 2013  . Melanoma     left flank, [place]  . Atrial flutter     a. Dx 03/2014  . Dysrhythmia     afib    Current Outpatient Prescriptions  Medication Sig Dispense Refill  . aspirin 81 MG tablet Take 81 mg by mouth daily.    Marland Kitchen atorvastatin (LIPITOR) 40 MG tablet Take 40 mg by mouth daily.    Marland Kitchen ezetimibe (ZETIA) 10 MG tablet Take 10 mg by mouth daily.    . metoprolol tartrate (LOPRESSOR) 25 MG tablet Take 12.5 mg by mouth 2 (two) times daily.     Marland Kitchen warfarin (COUMADIN) 5 MG tablet Take 5 mg by mouth daily.      No current facility-administered medications for this visit.    Allergies:   No Known Allergies  Social History:  The patient  reports that he has never smoked. He does not have any smokeless tobacco history on file. He reports that he drinks about 0.5 - 1.0 oz of  alcohol per week.   Family History:  The patient's family history includes Leukemia in his mother.   ROS:  Please see the history of present illness.  No nausea, vomiting.  No fevers, chills.  No focal weakness.  No dysuria.    All other systems reviewed and negative.   PHYSICAL EXAM: VS:  BP 130/80 mmHg  Pulse 58  Ht 6' (1.829 m)  Wt 209 lb 1.9 oz (94.856 kg)  BMI 28.36 kg/m2 Well nourished, well developed, in no acute distress HEENT: normal Neck: no JVD, no carotid bruits Cardiac:  normal S1, S2; RRR;  Lungs:  clear to auscultation bilaterally, no wheezing, rhonchi or rales Abd: soft, nontender, no hepatomegaly Ext: no edema Skin: warm and dry Neuro:   no focal abnormalities noted Psych: normal affect    ASSESSMENT AND PLAN:  Coronary atherosclerosis of native coronary artery /atrial flutter      Notes: Single-vessel bypass in 2009. SVG to OM. No angina.   Doing well post cardioversion. Maintaining sinus rhythm on  exam.  See below for discussion of anticoagulation.    2. Hyperlipidemia  Continue Lipitor Tablet, 40 MG, 1 tablet, Orally, Once a day Continue Zetia Tablet, 10 MG, 1 tablet, Orally, Once a day  LDL 78 in 2014. LDL 79 in 1/15   3. Other pulmonary embolism and infarction /atrial flutter Continue Warfarin Sodium Tablet, 5 MG, 5 mg qd except 2.5 mg T/Th/Sun, Orally, as directed  He would like to switch to a NOAC for DVT/PE.  He wil switch to  Eliquis 5 mg BID. INR 3.0 today.  Start Eliquis Thursday morning.  Discussed with pharmacist. Will use the higher dose of Eliquis since he also has atrial arrhytmia. Will need every 6 month labs including CBC and electrolytes. His labs were recently checked with Dr. Librarian, academic. Will try to obtain these lab values.  Will stop aspirin since we are starting Eliquis.   4. Mitral valve disorders  Notes: Status post mitral valve repair in 2009. Continue SBE prophylaxis.    5. Heart valve replaced by other means  Notes: Status post tissue valve to the aortic position and 2009.  Continue SBE prophylaxis.    6. OK to get flu shot.  Signed, Mina Marble, MD, [facility] 04/20/2014 5:02 PM

## 2014-04-20 NOTE — Patient Instructions (Addendum)
Your physician has recommended you make the following change in your medication:  1.) stop coumadin 2.) stop aspirin 3.) start Eliquis 5 mg twice daily---START ON Thursday 04/22/14  Your physician recommends that you schedule a follow-up appointment in: 3 months with Dr. Irish Lack.

## 2014-04-23 ENCOUNTER — Encounter: Payer: Medicare Other | Admitting: Internal Medicine

## 2014-04-30 ENCOUNTER — Ambulatory Visit (INDEPENDENT_AMBULATORY_CARE_PROVIDER_SITE_OTHER): Payer: Medicare Other | Admitting: Internal Medicine

## 2014-04-30 DIAGNOSIS — Z7184 Encounter for health counseling related to travel: Secondary | ICD-10-CM | POA: Insufficient documentation

## 2014-04-30 DIAGNOSIS — Z7189 Other specified counseling: Secondary | ICD-10-CM

## 2014-04-30 MED ORDER — AZITHROMYCIN 500 MG PO TABS: 1000.0000 mg | ORAL_TABLET | Freq: Once | ORAL | Status: DC

## 2014-04-30 MED ORDER — TYPHOID VACCINE PO CPDR
1.0000 | DELAYED_RELEASE_CAPSULE | ORAL | Status: DC
Start: 2014-04-30 — End: 2014-08-09

## 2014-04-30 MED ORDER — ATOVAQUONE-PROGUANIL HCL 250-100 MG PO TABS: 1.0000 | ORAL_TABLET | Freq: Every day | ORAL | Status: DC

## 2014-04-30 NOTE — Progress Notes (Signed)
Subjective:    Eric [name] is an 80 yo male who presents to the Infectious Disease clinic for travel consultation. Planned departure date: February 2016          Planned return date: 2 weeks Countries of travel: [place], [place]  and [place] Areas in country: urban and cruise ship   Accommodations: on ship Purpose of travel: vacation Prior travel out of [place]: yes     Objective:   Scheduled Meds: Continuous Infusions: PRN Meds:.   Medications:reviewed    Assessment:    No contraindications to travel. none     Plan:    Issues discussed: altitude illness, future shots, insect-borne illnesses, Japanese encephalitis, malaria, MVA safety, rabies, safe food/water, traveler's diarrhea, website/handouts for more information, what to do if ill upon return, what to do if ill while there and Yellow Fever. Immunizations recommended: Typhoid (oral). Malaria prophylaxis: will take malarone for trip to [facility] zone in case it is needed Traveler's diarrhea prophylaxis: azithromycin. Total duration of visit: 1 Hour. Total time spent on education, counseling, coordination of care: 30 Minutes.

## 2014-06-08 ENCOUNTER — Telehealth: Payer: Self-pay | Admitting: Interventional Cardiology

## 2014-06-08 MED ORDER — APIXABAN 5 MG PO TABS: 5.0000 mg | ORAL_TABLET | Freq: Two times a day (BID) | ORAL | Status: DC

## 2014-06-08 NOTE — Telephone Encounter (Signed)
Spoke with pt to verify that he wanted to switch Eliquis to [facility] order. Verified that pt wanted a 90 day prescription. Pt stated that this was correct. Switched Eliquis to mail order and sent prescription in to [facility].

## 2014-06-08 NOTE — Telephone Encounter (Signed)
New Message  Pt requesting to speak w/ Rn about changing his prescription of Eliquis so that [facility] like [facility] other [facility]. Please call back and discuss.

## 2014-06-17 DIAGNOSIS — I2510 Atherosclerotic heart disease of native coronary artery without angina pectoris: Secondary | ICD-10-CM | POA: Diagnosis not present

## 2014-06-17 DIAGNOSIS — I4892 Unspecified atrial flutter: Secondary | ICD-10-CM | POA: Diagnosis not present

## 2014-06-17 DIAGNOSIS — Z0000 Encounter for general adult medical examination without abnormal findings: Secondary | ICD-10-CM | POA: Diagnosis not present

## 2014-06-17 DIAGNOSIS — C61 Malignant neoplasm of prostate: Secondary | ICD-10-CM | POA: Diagnosis not present

## 2014-06-17 DIAGNOSIS — Z125 Encounter for screening for malignant neoplasm of prostate: Secondary | ICD-10-CM | POA: Diagnosis not present

## 2014-06-17 DIAGNOSIS — I2699 Other pulmonary embolism without acute cor pulmonale: Secondary | ICD-10-CM | POA: Diagnosis not present

## 2014-06-17 DIAGNOSIS — E780 Pure hypercholesterolemia: Secondary | ICD-10-CM | POA: Diagnosis not present

## 2014-06-17 DIAGNOSIS — Z1389 Encounter for screening for other disorder: Secondary | ICD-10-CM | POA: Diagnosis not present

## 2014-08-04 ENCOUNTER — Ambulatory Visit: Payer: Medicare Other | Admitting: Interventional Cardiology

## 2014-08-04 ENCOUNTER — Other Ambulatory Visit: Payer: Self-pay | Admitting: Dermatology

## 2014-08-04 DIAGNOSIS — B029 Zoster without complications: Secondary | ICD-10-CM | POA: Diagnosis not present

## 2014-08-04 DIAGNOSIS — L986 Other infiltrative disorders of the skin and subcutaneous tissue: Secondary | ICD-10-CM | POA: Diagnosis not present

## 2014-08-09 ENCOUNTER — Encounter: Payer: Self-pay | Admitting: Interventional Cardiology

## 2014-08-09 ENCOUNTER — Ambulatory Visit (INDEPENDENT_AMBULATORY_CARE_PROVIDER_SITE_OTHER): Payer: Medicare Other | Admitting: Interventional Cardiology

## 2014-08-09 VITALS — BP 136/64 | HR 66 | Ht 72.0 in | Wt 209.1 lb

## 2014-08-09 DIAGNOSIS — I2510 Atherosclerotic heart disease of native coronary artery without angina pectoris: Secondary | ICD-10-CM

## 2014-08-09 DIAGNOSIS — Z7901 Long term (current) use of anticoagulants: Secondary | ICD-10-CM

## 2014-08-09 DIAGNOSIS — Z952 Presence of prosthetic heart valve: Secondary | ICD-10-CM

## 2014-08-09 DIAGNOSIS — I2699 Other pulmonary embolism without acute cor pulmonale: Secondary | ICD-10-CM

## 2014-08-09 DIAGNOSIS — Z954 Presence of other heart-valve replacement: Secondary | ICD-10-CM

## 2014-08-09 DIAGNOSIS — Z5181 Encounter for therapeutic drug level monitoring: Secondary | ICD-10-CM

## 2014-08-09 NOTE — Patient Instructions (Signed)
Your physician recommends that you continue on your current medications as directed. Please refer to the Current Medication list given to you today.  Your physician recommends that you return for lab work in: July 2016 (CBC, BMET)  Your physician wants you to follow-up in: 1 year with Dr. Irish Lack.  You will receive a reminder letter in the mail two months in advance. If you don't receive a letter, please call our office to schedule the follow-up appointment.

## 2014-08-09 NOTE — Progress Notes (Signed)
Patient ID: Eric [name], male   DOB: 1929/09/21, 81 y.o.   MRN: [id number]    Eric, [place], [name]  [address] Phone: [phone] Fax:  [phone]  Date:  08/09/2014   ID:  Eric [name], DOB [birth year], MRN [id number]  PCP:  Irven Shelling, MD      History of Present Illness: Eric [name] is a 81 y.o. male who has had an aortic valve replacement and single vessel bypass in 2009.HE had a negative stress test in 2011. No chest pain prior to valve replacement surgery. He did have SHOB.  He exercises a few days a week. He rides a bike for 30 minutes. He works as an Interior and spatial designer. He had melanoma surgery in the past.   In late 2015, his heart rate was noted to be elevated. He was found to be in atrial flutter. He underwent successful cardioversion. He notes that during the time he was out of rhythm, he had a cough. This got better after the cardioversion.   He went to [place] in February 2016 for about 3 weeks and did well on the NOAC.  Less bleeding and bruising on Eliquis compared to his experience Coumadin.  Bothered by shingles recently.     Wt Readings from Last 3 Encounters:  08/09/14 209 lb 1.9 oz (94.856 kg)  04/20/14 209 lb 1.9 oz (94.856 kg)  04/06/14 200 lb (90.719 kg)     Past Medical History  Diagnosis Date  . Adenocarcinoma of prostate   . Dyslipidemia   . Aortic stenosis 06/19/2007    a. s/p AVR, tissue type;  b. 04/2013 Echo: EF 60-65%, mild AI, mild MR, mild-mod dil LA, mildly dil RA.  Marland Kitchen Hx of valvuloplasty 06/19/2007    a. s/p mitral ring, repair P2 MV  . Single vessel coronary artery disease 06/19/2007    a. 05/2007 CABG x 1: s/p SVG-OM;  b. 10/2010 Ex MV: EF 72%, inf attenuation w/o ischemia, brief run of PAT with exercise. To  . History of DVT (deep vein thrombosis)     a. chronic coumadin, pulmonary embolus 2000 and 2005, life long  . Chronic anticoagulation     a. coumadin - followed in [place] [facility]  [facility].  . Amaurosis fugax 2013  . Goiter 2013  . Melanoma     left flank, [place]  . Atrial flutter     a. Dx 03/2014  . Dysrhythmia     afib    Current Outpatient Prescriptions  Medication Sig Dispense Refill  . apixaban (ELIQUIS) 5 MG TABS tablet Take 1 tablet (5 mg total) by mouth 2 (two) times daily. 90 tablet 3  . atorvastatin (LIPITOR) 40 MG tablet Take 40 mg by mouth daily.    Marland Kitchen ezetimibe (ZETIA) 10 MG tablet Take 10 mg by mouth daily.    . metoprolol tartrate (LOPRESSOR) 25 MG tablet Take 12.5 mg by mouth 2 (two) times daily.     . valACYclovir (VALTREX) 1000 MG tablet Take 1 tablet by mouth 3 (three) times daily. Take 1 tablet by mouth 3 times a day for 7 days  0   No current facility-administered medications for this visit.    Allergies:   No Known Allergies  Social History:  The patient  reports that he has never smoked. He does not have any smokeless tobacco history on file. He reports that he drinks about 0.5 - 1.0 oz of alcohol per week.   Family History:  The patient's family history includes Leukemia in his mother.   ROS:  Please see the history of present illness.  No nausea, vomiting.  No fevers, chills.  No focal weakness.  No dysuria.    All other systems reviewed and negative.   PHYSICAL EXAM: VS:  BP 136/64 mmHg  Pulse 66  Ht 6' (1.829 m)  Wt 209 lb 1.9 oz (94.856 kg)  BMI 28.36 kg/m2 Well nourished, well developed, in no acute distress HEENT: normal Neck: no JVD, no carotid bruits Cardiac:  normal S1, S2; RRR; premature beats Lungs:  clear to auscultation bilaterally, no wheezing, rhonchi or rales Abd: soft, nontender, no hepatomegaly Ext: R>L,  Edema, chronic from DVT Skin: warm and dry Neuro:   no focal abnormalities noted Psych: normal affect    ASSESSMENT AND PLAN:  Coronary atherosclerosis of native coronary artery /atrial flutter      Notes: Single-vessel bypass in 2009. SVG to OM. No angina.   Doing well post cardioversion.  Maintaining sinus rhythm on exam.  See below for discussion of anticoagulation.    2. Hyperlipidemia  Continue Lipitor Tablet, 40 MG, 1 tablet, Orally, Once a day Continue Zetia Tablet, 10 MG, 1 tablet, Orally, Once a day  LDL 78 in 2014. LDL 79 in 1/15. Will obtain most recent results from Dr. Laurann Montana.    3. Other pulmonary embolism and infarction /atrial flutter Off of Warfarin Sodium Tablet, 5 MG, 5 mg qd except 2.5 mg T/Th/Sun, Orally, as directed  Succesful switch to a NOAC/Eliquis for DVT/PE and atrial flutter.  Will use the higher dose of Eliquis since he also has atrial arrhytmia. Will need every 6 month labs including CBC and electrolytes. Stopped aspirin since we are starting Eliquis.   4. Mitral valve disorders  Notes: Status post mitral valve repair in 2009. Continue SBE prophylaxis.    5. Heart valve replaced by other means  Notes: Status post tissue valve to the aortic position and 2009.  Continue SBE prophylaxis.     Signed, Mina Marble, MD, [facility] 08/09/2014 8:31 AM

## 2014-08-12 ENCOUNTER — Encounter: Payer: Self-pay | Admitting: Interventional Cardiology

## 2014-09-01 ENCOUNTER — Ambulatory Visit: Payer: Self-pay | Admitting: Interventional Cardiology

## 2014-09-01 DIAGNOSIS — I749 Embolism and thrombosis of unspecified artery: Secondary | ICD-10-CM

## 2014-11-03 DIAGNOSIS — D225 Melanocytic nevi of trunk: Secondary | ICD-10-CM | POA: Diagnosis not present

## 2014-11-03 DIAGNOSIS — L821 Other seborrheic keratosis: Secondary | ICD-10-CM | POA: Diagnosis not present

## 2014-11-03 DIAGNOSIS — D1801 Hemangioma of skin and subcutaneous tissue: Secondary | ICD-10-CM | POA: Diagnosis not present

## 2014-11-03 DIAGNOSIS — L814 Other melanin hyperpigmentation: Secondary | ICD-10-CM | POA: Diagnosis not present

## 2014-11-03 DIAGNOSIS — L570 Actinic keratosis: Secondary | ICD-10-CM | POA: Diagnosis not present

## 2014-11-30 ENCOUNTER — Other Ambulatory Visit: Payer: Medicare Other

## 2014-12-07 ENCOUNTER — Other Ambulatory Visit (INDEPENDENT_AMBULATORY_CARE_PROVIDER_SITE_OTHER): Payer: Medicare Other | Admitting: *Deleted

## 2014-12-07 DIAGNOSIS — Z7901 Long term (current) use of anticoagulants: Secondary | ICD-10-CM | POA: Diagnosis not present

## 2014-12-07 DIAGNOSIS — Z5181 Encounter for therapeutic drug level monitoring: Secondary | ICD-10-CM

## 2014-12-07 LAB — CBC
HCT: 44.6 % (ref 39.0–52.0)
Hemoglobin: 14.9 g/dL (ref 13.0–17.0)
MCHC: 33.5 g/dL (ref 30.0–36.0)
MCV: 90.1 fl (ref 78.0–100.0)
Platelets: 115 10*3/uL — ABNORMAL LOW (ref 150.0–400.0)
RBC: 4.94 Mil/uL (ref 4.22–5.81)
RDW: 12.6 % (ref 11.5–15.5)
WBC: 4.6 10*3/uL (ref 4.0–10.5)

## 2014-12-07 LAB — BASIC METABOLIC PANEL
BUN: 24 mg/dL — ABNORMAL HIGH (ref 6–23)
CO2: 27 meq/L (ref 19–32)
Calcium: 9.2 mg/dL (ref 8.4–10.5)
Chloride: 107 mEq/L (ref 96–112)
Creatinine, Ser: 0.84 mg/dL (ref 0.40–1.50)
GFR: 92.73 mL/min (ref >60.00)
Glucose, Bld: 92 mg/dL (ref 70–99)
Potassium: 4.2 mEq/L (ref 3.5–5.1)
SODIUM: 140 meq/L (ref 135–145)

## 2014-12-10 ENCOUNTER — Other Ambulatory Visit: Payer: Self-pay | Admitting: Interventional Cardiology

## 2014-12-10 MED ORDER — APIXABAN 5 MG PO TABS: 5.0000 mg | ORAL_TABLET | Freq: Two times a day (BID) | ORAL | Status: DC

## 2014-12-13 ENCOUNTER — Other Ambulatory Visit: Payer: Self-pay

## 2014-12-13 MED ORDER — APIXABAN 5 MG PO TABS: 5.0000 mg | ORAL_TABLET | Freq: Two times a day (BID) | ORAL | Status: DC

## 2015-02-18 DIAGNOSIS — Z23 Encounter for immunization: Secondary | ICD-10-CM | POA: Diagnosis not present

## 2015-03-11 DIAGNOSIS — H26491 Other secondary cataract, right eye: Secondary | ICD-10-CM | POA: Diagnosis not present

## 2015-03-11 DIAGNOSIS — H524 Presbyopia: Secondary | ICD-10-CM | POA: Diagnosis not present

## 2015-03-11 DIAGNOSIS — H04123 Dry eye syndrome of bilateral lacrimal glands: Secondary | ICD-10-CM | POA: Diagnosis not present

## 2015-03-11 DIAGNOSIS — Z961 Presence of intraocular lens: Secondary | ICD-10-CM | POA: Diagnosis not present

## 2015-03-22 DIAGNOSIS — H01001 Unspecified blepharitis right upper eyelid: Secondary | ICD-10-CM | POA: Diagnosis not present

## 2015-03-22 DIAGNOSIS — Z961 Presence of intraocular lens: Secondary | ICD-10-CM | POA: Diagnosis not present

## 2015-03-22 DIAGNOSIS — H26491 Other secondary cataract, right eye: Secondary | ICD-10-CM | POA: Diagnosis not present

## 2015-03-22 DIAGNOSIS — H04123 Dry eye syndrome of bilateral lacrimal glands: Secondary | ICD-10-CM | POA: Diagnosis not present

## 2015-05-12 DIAGNOSIS — M75111 Incomplete rotator cuff tear or rupture of right shoulder, not specified as traumatic: Secondary | ICD-10-CM | POA: Diagnosis not present

## 2015-05-19 ENCOUNTER — Ambulatory Visit: Payer: Medicare Other | Admitting: Podiatry

## 2015-05-20 ENCOUNTER — Encounter: Payer: Self-pay | Admitting: Podiatry

## 2015-05-20 ENCOUNTER — Ambulatory Visit (INDEPENDENT_AMBULATORY_CARE_PROVIDER_SITE_OTHER): Payer: Medicare Other | Admitting: Podiatry

## 2015-05-20 ENCOUNTER — Ambulatory Visit (INDEPENDENT_AMBULATORY_CARE_PROVIDER_SITE_OTHER): Payer: Medicare Other

## 2015-05-20 DIAGNOSIS — I2510 Atherosclerotic heart disease of native coronary artery without angina pectoris: Secondary | ICD-10-CM

## 2015-05-20 DIAGNOSIS — M722 Plantar fascial fibromatosis: Secondary | ICD-10-CM | POA: Diagnosis not present

## 2015-05-20 MED ORDER — TRIAMCINOLONE ACETONIDE 10 MG/ML IJ SUSP
10.0000 mg | Freq: Once | INTRAMUSCULAR | Status: AC
  Administered 2015-05-20: 10 mg

## 2015-05-20 NOTE — Progress Notes (Signed)
   Subjective:    Patient ID: [name], male    DOB: [birth year], 81 y.o.   MRN: [id number]  HPI  PT STATED RT BOTTOM OF THE HEEL IS BEEN HURTING/ SHARP PAIN FOR 2 MONTHS. HEEL IS GETTING WORSE WHEN WALKING/PRESSURE. TRIED NO TREATMENT  Review of Systems  Musculoskeletal: Positive for gait problem.       Objective:   Physical Exam        Assessment & Plan:

## 2015-05-20 NOTE — Progress Notes (Signed)
Subjective:     Patient ID: [name], male   DOB: Jun 05, 1929, 81 y.o.   MRN: [id number]  HPI patient presents stating my heel has been bothering me a lot for the last 2 months. It's worse when I walk or when I try to put pressure on it don't remember an injury   Review of Systems  All other systems reviewed and are negative.      Objective:   Physical Exam  Constitutional: He is oriented to person, place, and time.  Cardiovascular: Intact distal pulses.   Musculoskeletal: Normal range of motion.  Neurological: He is oriented to person, place, and time.  Skin: Skin is warm.  Nursing note and vitals reviewed.  neurovascular status found to be intact muscle strength adequate range of motion within normal limits with patient noted to have exquisite discomfort plantar aspect right heel at the insertional point of the tendon into the calcaneus with inflammation and fluid around the medial band period found to have good digital perfusion and is well oriented 3     Assessment:      plantar fasciitis right with inflammation and fluid around the medial band    Plan:      H&P and x-rays reviewed of both feet and today I injected the plantar fascia right 3 mg Kenalog 5 mg Xylocaine and dispensed fascial brace with instructions on usage. Reappoint to recheck

## 2015-06-06 ENCOUNTER — Other Ambulatory Visit: Payer: Self-pay | Admitting: Interventional Cardiology

## 2015-06-21 LAB — COMPREHENSIVE METABOLIC PANEL WITH GFR: EGFR: 67

## 2015-07-26 ENCOUNTER — Ambulatory Visit (INDEPENDENT_AMBULATORY_CARE_PROVIDER_SITE_OTHER): Payer: Medicare Other | Admitting: Interventional Cardiology

## 2015-07-26 ENCOUNTER — Encounter: Payer: Self-pay | Admitting: Interventional Cardiology

## 2015-07-26 VITALS — BP 138/60 | HR 64 | Ht 72.0 in | Wt 206.6 lb

## 2015-07-26 DIAGNOSIS — Z952 Presence of prosthetic heart valve: Secondary | ICD-10-CM

## 2015-07-26 DIAGNOSIS — I2510 Atherosclerotic heart disease of native coronary artery without angina pectoris: Secondary | ICD-10-CM | POA: Diagnosis not present

## 2015-07-26 DIAGNOSIS — I483 Typical atrial flutter: Secondary | ICD-10-CM

## 2015-07-26 DIAGNOSIS — Z954 Presence of other heart-valve replacement: Secondary | ICD-10-CM

## 2015-07-26 DIAGNOSIS — E785 Hyperlipidemia, unspecified: Secondary | ICD-10-CM

## 2015-07-26 NOTE — Progress Notes (Signed)
Patient ID: Eric [name], male   DOB: [birth year], 82 y.o.   MRN: [id number]     Cardiology Office Note   Date:  07/26/2015   ID:  [name], DOB Mar 21, 1930, MRN [id number]  PCP:  Irven Shelling, MD    No chief complaint on file. f/u CAD   Wt Readings from Last 3 Encounters:  07/26/15 206 lb 9.6 oz (93.713 kg)  08/09/14 209 lb 1.9 oz (94.856 kg)  04/20/14 209 lb 1.9 oz (94.856 kg)       History of Present Illness: Eric [name] is a 82 y.o. male  who has had an aortic valve replacement and single vessel bypass in 2009.HE had a negative stress test in 2011. No chest pain prior to valve replacement surgery. He did have SHOB.  He exercises a few days a week, without any issues.  In late 2015, his heart rate was noted to be elevated at the time of a tooth extraction. He was found to be in atrial flutter. He underwent successful cardioversion. He notes that during the time he was out of rhythm, he had a cough. This got better after the cardioversion.   He went to [place] in February 2016 for about 3 weeks and did well on the NOAC. Less bleeding and bruising on Eliquis compared to his experience Coumadin.  He then changed from Coumadin to Eliquis.  He needs another tooth extraction. Last time this was done, he stayed on his anticoagulation. He had significant bleeding issues at that time. This time, he is planning on coming off of the Eliquis.    Past Medical History  Diagnosis Date  . Adenocarcinoma of prostate ([place])   . Dyslipidemia   . Aortic stenosis 06/19/2007    a. s/p AVR, tissue type;  b. 04/2013 Echo: EF 60-65%, mild AI, mild MR, mild-mod dil LA, mildly dil RA.  Marland Kitchen Hx of valvuloplasty 06/19/2007    a. s/p mitral ring, repair P2 MV  . Single vessel coronary artery disease 06/19/2007    a. 05/2007 CABG x 1: s/p SVG-OM;  b. 10/2010 Ex MV: EF 72%, inf attenuation w/o ischemia, brief run of PAT with exercise. To  . History of DVT (deep vein thrombosis)     a. chronic  coumadin, pulmonary embolus 2000 and 2005, life long  . Chronic anticoagulation     a. coumadin - followed in [place] coumad[facility] clinic.  . Amaurosis fugax 2013  . Goiter 2013  . Melanoma ([place])     left flank, [place]  . Atrial flutter ([place])     a. Dx 03/2014  . Dysrhythmia     afib    Past Surgical History  Procedure Laterality Date  . Corrective eye surgery as a child    . Left rotator cuff surgery  5/00  . Avr, mitro- flow, 23 mm, with svg-om1  1/09  . S/p radical retropubic prostatectomy,  00  . Left eye cataract    . Left side melanoma    . Right eye catraract  2009  . Tee without cardioversion N/A 04/06/2014    Procedure: TRANSESOPHAGEAL ECHOCARDIOGRAM (TEE);  Surgeon: [name], MD;  Location: [facility];  Service: Cardiovascular;  Laterality: N/A;  . Cardioversion N/A 04/06/2014    Procedure: CARDIOVERSION;  Surgeon: [name], MD;  Location[facility];  Service: Cardiovascular;  Laterality: N/A;     Current Outpatient Prescriptions  Medication Sig Dispense Refill  . amoxicillin (AMOXIL) 500 MG capsule Take 500  mg by mouth as needed (DENTAL APPTS.).   0  . apixaban (ELIQUIS) 5 MG TABS tablet Take 1 tablet (5 mg total) by mouth 2 (two) times daily. 180 tablet 1  . atorvastatin (LIPITOR) 40 MG tablet Take 40 mg by mouth daily.    Marland Kitchen ezetimibe (ZETIA) 10 MG tablet Take 10 mg by mouth daily.    . metoprolol tartrate (LOPRESSOR) 25 MG tablet Take 12.5 mg by mouth 2 (two) times daily.      No current facility-administered medications for this visit.    Allergies:   Review of patient's allergies indicates no known allergies.    Social History:  The patient  reports that he has never smoked. He does not have any smokeless tobacco history on file. He reports that he drinks about 0.5 - 1.0 oz of alcohol per week.   Family History:  The patient's family history includes Leukemia in his mother. There is no history of Heart attack, Stroke, or  Hypertension.    ROS:  Please see the history of present illness.   Otherwise, review of systems are positive for dental issues.   All other systems are reviewed and negative.    PHYSICAL EXAM: VS:  BP 138/60 mmHg  Pulse 64  Ht 6' (1.829 m)  Wt 206 lb 9.6 oz (93.713 kg)  BMI 28.01 kg/m2 , BMI Body mass index is 28.01 kg/(m^2). GEN: Well nourished, well developed, in no acute distress HEENT: normal Neck: no JVD, carotid bruits, or masses Cardiac: RRR; no murmurs, rubs, or gallops,no edema  Respiratory:  clear to auscultation bilaterally, normal work of breathing GI: soft, nontender, nondistended, + BS MS: no deformity or atrophy Skin: warm and dry, no rash Neuro:  Strength and sensation are intact Psych: euthymic mood, full affect   EKG:   The ekg ordered today demonstrates normal sinus rhythm with PACs   Recent Labs: 12/07/2014: BUN 24*; Creatinine, Ser 0.84; Hemoglobin 14.9; Platelets 115.0*; Potassium 4.2; Sodium 140   Lipid Panel No results found for: CHOL, TRIG, HDL, CHOLHDL, VLDL, LDLCALC, LDLDIRECT   Other studies Reviewed: Additional studies/ records that were reviewed today with results demonstrating: .   ASSESSMENT AND PLAN:  1. Preoperative cardiovascular assessment: I discussed with the Pharm.D. He should stop his Eliquis 24 hours prior to the tooth extraction. He can restart the Eliquis 24-48 hours postprocedure at the discretion of the oral surgeon. If there is a low bleeding risk, 24 hours may be sufficient. I did explain to him that Eliquis will be therapeutic about an hour after he takes the medicine.  2. Status post AVR: He does have a prescription for amoxicillin for SBE prophylaxis for this procedure. 3. CAD: No angina. Continue aggressive secondary prevention.  Continue atorvastatin and Zetia for his hyperlipidemia. 4. Atrial flutter:  Back in NSR. Eliquis for stroke prevention.   Current medicines are reviewed at length with the patient today.  The  patient concerns regarding his medicines were addressed.  The following changes have been made:  Eliquis instructions given to the patient  Labs/ tests ordered today include:  No orders of the defined types were placed in this encounter.    Recommend 150 minutes/week of aerobic exercise Low fat, low carb, high fiber diet recommended  Disposition:   FU in 1 year   Teresita Madura., MD  07/26/2015 11:46 AM    [facility] [address], [place], [place]  [address] Phone: [phone]; Fax: [phone]

## 2015-07-26 NOTE — Patient Instructions (Signed)
**Note De-Identified Evangelyne Loja Obfuscation** Medication Instructions:  Same-no changes  Labwork: None  Testing/Procedures: None  Follow-Up: Your physician wants you to follow-up in: 1 year. You will receive a reminder letter in the mail two months in advance. If you don't receive a letter, please call our office to schedule the follow-up appointment.   Any Other Special Instructions Will Be Listed Below (If Applicable). Stop taking Eliquis 24 hours prior to procedure and resume 24 to 48 hour after procedure at Dentist discretion.      If you need a refill on your cardiac medications before your next appointment, please call your pharmacy.

## 2015-08-03 ENCOUNTER — Ambulatory Visit: Payer: Medicare Other | Admitting: Interventional Cardiology

## 2015-08-15 ENCOUNTER — Telehealth: Payer: Self-pay | Admitting: *Deleted

## 2015-08-15 NOTE — Telephone Encounter (Signed)
Patient Instructions     Medication Instructions:  Same-no changes  Labwork: None  Testing/Procedures: None  Follow-Up: Your physician wants you to follow-up in: 1 year. You will receive a reminder letter in the mail two months in advance. If you don't receive a letter, please call our office to schedule the follow-up appointment.   Any Other Special Instructions Will Be Listed Below (If Applicable). Stop taking Eliquis 24 hours prior to procedure and resume 24 to 48 hour after procedure at Dentist discretion.       wanted clarification on when to start eliquis back, re-read instructions from [place] with dr Irish Lack on 07/26/15, pt expressed understanding.

## 2015-09-05 ENCOUNTER — Other Ambulatory Visit: Payer: Self-pay | Admitting: Interventional Cardiology

## 2016-01-25 ENCOUNTER — Telehealth: Payer: Self-pay | Admitting: Interventional Cardiology

## 2016-01-25 ENCOUNTER — Other Ambulatory Visit: Payer: Self-pay | Admitting: Urology

## 2016-01-25 NOTE — Telephone Encounter (Signed)
**Note De-identified Shalamar Plourde Obfuscation** Please advise 

## 2016-01-25 NOTE — Telephone Encounter (Signed)
Request for surgical clearance:  1. What type of surgery is being performed? TUR Bladder Tumor   2. When is this surgery scheduled? 02-13-16  3. Are there any medications that need to be held prior to surgery and how long?Can Pt stop Eliquis for 2 days?3. Name of physician performing surgery? Dr Baruch Gouty   4. What is your office phone and fax number? [phone] and fax number is [phone]

## 2016-01-30 NOTE — Telephone Encounter (Signed)
Please address Eliquis.

## 2016-01-31 NOTE — Telephone Encounter (Signed)
Pt on chronic anticoagulant due to history of PE in 2000 and 2005. Ok to hold Eliquis for 2 days prior to procedure but would recommend resuming Eliquis ASAP after the procedure.

## 2016-02-01 ENCOUNTER — Telehealth: Payer: Self-pay | Admitting: Interventional Cardiology

## 2016-02-01 NOTE — Telephone Encounter (Signed)
New message    Pts wife is calling to make sure that the doctor knows that the pt is having surgery on the 9/25 and she is concerned about the pt stopping the Eliquis 2 days prior and she want to know when to restart the med. Dr. Chrisandra Carota office # is [phone] please confer with the doctor. Please call the pts wife as well.

## 2016-02-01 NOTE — Telephone Encounter (Signed)
Pt calling wanting to be sure Dr. Irish Lack is aware that he is having surgery on 9/25 with Dr. Chrisandra Carota for TURP for bladder tumor.  He wanted to verify that he was to stop Eliquis 2 days prior.  Advised that he is to stop Eliquis 2 days prior and then restart immediately after procedure(per Mathews Robinsons)  He and his wife verbalizes understanding.

## 2016-02-02 NOTE — Telephone Encounter (Signed)
This message has been faxed to [facility] ATTN: Hansel Starling at [phone]. I did receive confirmation that the fax was successful.

## 2016-02-09 ENCOUNTER — Encounter (HOSPITAL_BASED_OUTPATIENT_CLINIC_OR_DEPARTMENT_OTHER): Payer: Self-pay | Admitting: *Deleted

## 2016-02-09 NOTE — Progress Notes (Signed)
NPO AFTER MN.  ARRIVE AT 1100.  NEEDS  CBC, BMET, PT/INR, PTT.  CURRENT EKG IN CHART AND EPIC.  WILL TAKE METOPROLOL AM DOS W/ SIPS OF WATER.  PT AWARE TO STOP ELIQUIS 2 DAYS PRIOR TO SURGERY.

## 2016-02-13 ENCOUNTER — Ambulatory Visit (HOSPITAL_BASED_OUTPATIENT_CLINIC_OR_DEPARTMENT_OTHER): Payer: Medicare Other | Admitting: Anesthesiology

## 2016-02-13 ENCOUNTER — Encounter (HOSPITAL_BASED_OUTPATIENT_CLINIC_OR_DEPARTMENT_OTHER): Payer: Self-pay | Admitting: Anesthesiology

## 2016-02-13 ENCOUNTER — Ambulatory Visit (HOSPITAL_BASED_OUTPATIENT_CLINIC_OR_DEPARTMENT_OTHER)
Admission: RE | Admit: 2016-02-13 | Discharge: 2016-02-13 | Disposition: A | Discharge status: Home / Self Care | Payer: Medicare Other | Source: Ambulatory Visit | Attending: Urology | Admitting: Urology

## 2016-02-13 ENCOUNTER — Encounter (HOSPITAL_BASED_OUTPATIENT_CLINIC_OR_DEPARTMENT_OTHER)
Admission: RE | Disposition: A | Discharge status: Home / Self Care | Payer: Self-pay | Source: Ambulatory Visit | Attending: Urology

## 2016-02-13 DIAGNOSIS — E7800 Pure hypercholesterolemia, unspecified: Secondary | ICD-10-CM | POA: Diagnosis not present

## 2016-02-13 DIAGNOSIS — I2510 Atherosclerotic heart disease of native coronary artery without angina pectoris: Secondary | ICD-10-CM | POA: Diagnosis not present

## 2016-02-13 DIAGNOSIS — Z9079 Acquired absence of other genital organ(s): Secondary | ICD-10-CM | POA: Insufficient documentation

## 2016-02-13 DIAGNOSIS — Z952 Presence of prosthetic heart valve: Secondary | ICD-10-CM | POA: Insufficient documentation

## 2016-02-13 DIAGNOSIS — C678 Malignant neoplasm of overlapping sites of bladder: Secondary | ICD-10-CM | POA: Insufficient documentation

## 2016-02-13 DIAGNOSIS — Z7901 Long term (current) use of anticoagulants: Secondary | ICD-10-CM | POA: Diagnosis not present

## 2016-02-13 DIAGNOSIS — Z8546 Personal history of malignant neoplasm of prostate: Secondary | ICD-10-CM | POA: Insufficient documentation

## 2016-02-13 DIAGNOSIS — C679 Malignant neoplasm of bladder, unspecified: Secondary | ICD-10-CM | POA: Diagnosis present

## 2016-02-13 DIAGNOSIS — I4891 Unspecified atrial fibrillation: Secondary | ICD-10-CM | POA: Diagnosis not present

## 2016-02-13 DIAGNOSIS — D494 Neoplasm of unspecified behavior of bladder: Secondary | ICD-10-CM

## 2016-02-13 DIAGNOSIS — Z87442 Personal history of urinary calculi: Secondary | ICD-10-CM | POA: Diagnosis not present

## 2016-02-13 DIAGNOSIS — Z79899 Other long term (current) drug therapy: Secondary | ICD-10-CM | POA: Diagnosis not present

## 2016-02-13 HISTORY — DX: Personal history of malignant neoplasm of prostate: Z85.46

## 2016-02-13 HISTORY — DX: Nontoxic goiter, unspecified: E04.9

## 2016-02-13 HISTORY — PX: TRANSURETHRAL RESECTION OF BLADDER TUMOR: SHX2575

## 2016-02-13 HISTORY — DX: Presence of xenogenic heart valve: Z95.3

## 2016-02-13 HISTORY — DX: Personal history of malignant melanoma of skin: Z85.820

## 2016-02-13 HISTORY — DX: Personal history of pulmonary embolism: Z86.711

## 2016-02-13 HISTORY — DX: Personal history of other malignant neoplasm of skin: Z85.828

## 2016-02-13 HISTORY — DX: Unspecified atrial flutter: I48.92

## 2016-02-13 HISTORY — DX: Personal history of malignant melanoma of skin: Z98.890

## 2016-02-13 HISTORY — DX: Neoplasm of unspecified behavior of bladder: D49.4

## 2016-02-13 HISTORY — DX: Presence of aortocoronary bypass graft: Z95.1

## 2016-02-13 LAB — CBC
HCT: 42.8 % (ref 39.0–52.0)
Hemoglobin: 14.1 g/dL (ref 13.0–17.0)
MCH: 29.5 pg (ref 26.0–34.0)
MCHC: 32.9 g/dL (ref 30.0–36.0)
MCV: 89.5 fL (ref 78.0–100.0)
PLATELETS: 114 10*3/uL — AB (ref 150–400)
RBC: 4.78 MIL/uL (ref 4.22–5.81)
RDW: 12.4 % (ref 11.5–15.5)
WBC: 4.1 10*3/uL (ref 4.0–10.5)

## 2016-02-13 LAB — BASIC METABOLIC PANEL
Anion gap: 7 (ref 5–15)
BUN: 22 mg/dL — AB (ref 6–20)
CO2: 25 mmol/L (ref 22–32)
CREATININE: 0.96 mg/dL (ref 0.61–1.24)
Calcium: 9.2 mg/dL (ref 8.9–10.3)
Chloride: 109 mmol/L (ref 101–111)
GFR calc Af Amer: >60 mL/min (ref >60)
Glucose, Bld: 99 mg/dL (ref 65–99)
Potassium: 4 mmol/L (ref 3.5–5.1)
SODIUM: 141 mmol/L (ref 135–145)

## 2016-02-13 LAB — APTT: APTT: 32 s (ref 24–36)

## 2016-02-13 LAB — PROTIME-INR
INR: 1.12
Prothrombin Time: 14.5 seconds (ref 11.4–15.2)

## 2016-02-13 SURGERY — TURBT (TRANSURETHRAL RESECTION OF BLADDER TUMOR)
Anesthesia: General

## 2016-02-13 MED ORDER — PROPOFOL 10 MG/ML IV BOLUS
INTRAVENOUS | Status: AC
  Filled 2016-02-13: qty 20

## 2016-02-13 MED ORDER — LIDOCAINE HCL 2 % EX GEL
CUTANEOUS | Status: AC
  Filled 2016-02-13: qty 5

## 2016-02-13 MED ORDER — LIDOCAINE 2% (20 MG/ML) 5 ML SYRINGE
INTRAMUSCULAR | Status: DC | PRN
  Administered 2016-02-13: 80 mg via INTRAVENOUS

## 2016-02-13 MED ORDER — GENTAMICIN IN SALINE 1.6-0.9 MG/ML-% IV SOLN
80.0000 mg | Freq: Once | INTRAVENOUS | Status: DC
  Filled 2016-02-13: qty 50

## 2016-02-13 MED ORDER — PROPOFOL 10 MG/ML IV BOLUS
INTRAVENOUS | Status: DC | PRN
  Administered 2016-02-13: 150 mg via INTRAVENOUS
  Administered 2016-02-13: 30 mg via INTRAVENOUS

## 2016-02-13 MED ORDER — BELLADONNA ALKALOIDS-OPIUM 16.2-60 MG RE SUPP
RECTAL | Status: DC | PRN
  Administered 2016-02-13: 1 via RECTAL

## 2016-02-13 MED ORDER — LIDOCAINE HCL (CARDIAC) 20 MG/ML IV SOLN
INTRAVENOUS | Status: DC | PRN
  Administered 2016-02-13: 80 mg via INTRAVENOUS

## 2016-02-13 MED ORDER — GENTAMICIN SULFATE 40 MG/ML IJ SOLN
5.0000 mg/kg | Freq: Once | INTRAVENOUS | Status: AC
  Administered 2016-02-13: 430 mg via INTRAVENOUS
  Filled 2016-02-13 (×2): qty 10.75

## 2016-02-13 MED ORDER — LACTATED RINGERS IV SOLN
INTRAVENOUS | Status: DC
  Administered 2016-02-13 (×2): via INTRAVENOUS
  Filled 2016-02-13: qty 1000

## 2016-02-13 MED ORDER — CEPHALEXIN 500 MG PO CAPS: 500.0000 mg | ORAL_CAPSULE | Freq: Three times a day (TID) | ORAL | 0 refills | Status: DC

## 2016-02-13 MED ORDER — SODIUM CHLORIDE 0.9 % IR SOLN: Status: DC | PRN

## 2016-02-13 MED ORDER — DEXAMETHASONE SODIUM PHOSPHATE 4 MG/ML IJ SOLN
INTRAMUSCULAR | Status: DC | PRN
  Administered 2016-02-13: 5 mg via INTRAVENOUS

## 2016-02-13 MED ORDER — FENTANYL CITRATE (PF) 100 MCG/2ML IJ SOLN
INTRAMUSCULAR | Status: DC | PRN
  Administered 2016-02-13 (×3): 25 ug via INTRAVENOUS
  Administered 2016-02-13: 50 ug via INTRAVENOUS
  Administered 2016-02-13: 25 ug via INTRAVENOUS

## 2016-02-13 MED ORDER — CEFAZOLIN SODIUM-DEXTROSE 2-4 GM/100ML-% IV SOLN
INTRAVENOUS | Status: AC
  Filled 2016-02-13: qty 100

## 2016-02-13 MED ORDER — HYDROCODONE-ACETAMINOPHEN 5-325 MG PO TABS: 1.0000 | ORAL_TABLET | ORAL | 0 refills | Status: DC | PRN

## 2016-02-13 MED ORDER — FENTANYL CITRATE (PF) 100 MCG/2ML IJ SOLN
INTRAMUSCULAR | Status: AC
  Filled 2016-02-13: qty 2

## 2016-02-13 MED ORDER — CEFAZOLIN SODIUM-DEXTROSE 2-4 GM/100ML-% IV SOLN
2.0000 g | INTRAVENOUS | Status: AC
  Administered 2016-02-13: 2 g via INTRAVENOUS
  Filled 2016-02-13: qty 100

## 2016-02-13 MED ORDER — ONDANSETRON HCL 4 MG/2ML IJ SOLN
INTRAMUSCULAR | Status: AC
  Filled 2016-02-13: qty 2

## 2016-02-13 MED ORDER — LIDOCAINE 2% (20 MG/ML) 5 ML SYRINGE
INTRAMUSCULAR | Status: AC
  Filled 2016-02-13: qty 5

## 2016-02-13 MED ORDER — BELLADONNA ALKALOIDS-OPIUM 16.2-60 MG RE SUPP
RECTAL | Status: AC
  Filled 2016-02-13: qty 1

## 2016-02-13 MED ORDER — ONDANSETRON HCL 4 MG/2ML IJ SOLN
INTRAMUSCULAR | Status: DC | PRN
  Administered 2016-02-13: 4 mg via INTRAVENOUS

## 2016-02-13 SURGICAL SUPPLY — 33 items
BAG DRAIN URO-CYSTO SKYTR STRL (DRAIN) ×1 IMPLANT
BAG DRN UROCATH (DRAIN)
BAG URINE DRAINAGE (UROLOGICAL SUPPLIES) IMPLANT
BAG URINE LEG 500ML (DRAIN) IMPLANT
CATH FOLEY 2WAY SLVR  5CC 20FR (CATHETERS) ×1
CATH FOLEY 2WAY SLVR  5CC 22FR (CATHETERS)
CATH FOLEY 2WAY SLVR 30CC 20FR (CATHETERS) IMPLANT
CATH FOLEY 2WAY SLVR 5CC 20FR (CATHETERS) IMPLANT
CATH FOLEY 2WAY SLVR 5CC 22FR (CATHETERS) IMPLANT
CATH INTERMIT  6FR 70CM (CATHETERS) ×1 IMPLANT
CLOTH BEACON ORANGE TIMEOUT ST (SAFETY) ×2 IMPLANT
ELECT BIVAP BIPO 22/24 DONUT (ELECTROSURGICAL)
ELECT LOOP 22F BIPOLAR SML (ELECTROSURGICAL) ×2
ELECT REM PT RETURN 9FT ADLT (ELECTROSURGICAL)
ELECTRD BIVAP BIPO 22/24 DONUT (ELECTROSURGICAL) IMPLANT
ELECTRODE LOOP 22F BIPOLAR SML (ELECTROSURGICAL) IMPLANT
ELECTRODE REM PT RTRN 9FT ADLT (ELECTROSURGICAL) ×1 IMPLANT
EVACUATOR MICROVAS BLADDER (UROLOGICAL SUPPLIES) ×1 IMPLANT
GLOVE BIO SURGEON STRL SZ7.5 (GLOVE) ×2 IMPLANT
GOWN STRL REUS W/ TWL LRG LVL3 (GOWN DISPOSABLE) ×1 IMPLANT
GOWN STRL REUS W/ TWL XL LVL3 (GOWN DISPOSABLE) ×1 IMPLANT
GOWN STRL REUS W/TWL LRG LVL3 (GOWN DISPOSABLE) ×2
GOWN STRL REUS W/TWL XL LVL3 (GOWN DISPOSABLE) ×2
GUIDEWIRE STR DUAL SENSOR (WIRE) ×1 IMPLANT
IV NS IRRIG 3000ML ARTHROMATIC (IV SOLUTION) ×3 IMPLANT
KIT ROOM TURNOVER WOR (KITS) ×2 IMPLANT
LOOP CUT BIPOLAR 24F LRG (ELECTROSURGICAL) IMPLANT
MANIFOLD NEPTUNE II (INSTRUMENTS) ×2 IMPLANT
NS IRRIG 1000ML POUR BTL (IV SOLUTION) ×1 IMPLANT
PACK CYSTO (CUSTOM PROCEDURE TRAY) ×2 IMPLANT
SET ASPIRATION TUBING (TUBING) IMPLANT
SYRINGE IRR TOOMEY STRL 70CC (SYRINGE) IMPLANT
TUBE CONNECTING 12X1/4 (SUCTIONS) ×1 IMPLANT

## 2016-02-13 NOTE — Anesthesia Procedure Notes (Signed)
Procedure Name: LMA Insertion Date/Time: 02/13/2016 12:49 PM Performed by: Justice Rocher Pre-anesthesia Checklist: Patient identified, Emergency Drugs available, Suction available and Patient being monitored Patient Re-evaluated:Patient Re-evaluated prior to inductionOxygen Delivery Method: Circle system utilized Preoxygenation: Pre-oxygenation with 100% oxygen Intubation Type: IV induction Ventilation: Mask ventilation without difficulty LMA: LMA inserted LMA Size: 5.0 Number of attempts: 1 Airway Equipment and Method: Bite block Placement Confirmation: positive ETCO2 and breath sounds checked- equal and bilateral Tube secured with: Tape Dental Injury: Teeth and Oropharynx as per pre-operative assessment

## 2016-02-13 NOTE — Discharge Instructions (Signed)
Post Anesthesia Home Care Instructions  Activity: Get plenty of rest for the remainder of the day. A responsible adult should stay with you for 24 hours following the procedure.  For the next 24 hours, DO NOT: -Drive a car -Paediatric nurse -Drink alcoholic beverages -Take any medication unless instructed by your physician -Make any legal decisions or sign important papers.  Meals: Start with liquid foods such as gelatin or soup. Progress to regular foods as tolerated. Avoid greasy, spicy, heavy foods. If nausea and/or vomiting occur, drink only clear liquids until the nausea and/or vomiting subsides. Call your physician if vomiting continues.  Special Instructions/Symptoms: Your throat may feel dry or sore from the anesthesia or the breathing tube placed in your throat during surgery. If this causes discomfort, gargle with warm salt water. The discomfort should disappear within 24 hours.  If you had a scopolamine patch placed behind your ear for the management of post- operative nausea and/or vomiting:  1. The medication in the patch is effective for 72 hours, after which it should be removed.  Wrap patch in a tissue and discard in the trash. Wash hands thoroughly with soap and water. 2. You may remove the patch earlier than 72 hours if you experience unpleasant side effects which may include dry mouth, dizziness or visual disturbances. 3. Avoid touching the patch. Wash your hands with soap and water after contact with the patch.   Foley Catheter Care, Adult A Foley catheter is a soft, flexible tube that is placed into the bladder to drain urine. A Foley catheter may be inserted if:  You leak urine or are not able to control when you urinate (urinary incontinence).  You are not able to urinate when you need to (urinary retention).  You had prostate surgery or surgery on the genitals.  You have certain medical conditions, such as multiple sclerosis, dementia, or a spinal cord  injury. If you are going home with a Foley catheter in place, follow the instructions below. TAKING CARE OF THE CATHETER 1. Wash your hands with soap and water. 2. Using mild soap and warm water on a clean washcloth:  Clean the area on your body closest to the catheter insertion site using a circular motion, moving away from the catheter. Never wipe toward the catheter because this could sweep bacteria up into the urethra and cause infection.  Remove all traces of soap. Pat the area dry with a clean towel. For males, reposition the foreskin. 3. Attach the catheter to your leg so there is no tension on the catheter. Use adhesive tape or a leg strap. If you are using adhesive tape, remove any sticky residue left behind by the previous tape you used. 4. Keep the drainage bag below the level of the bladder, but keep it off the floor. 5. Check throughout the day to be sure the catheter is working and urine is draining freely. Make sure the tubing does not become kinked. 6. Do not pull on the catheter or try to remove it. Pulling could damage internal tissues. TAKING CARE OF THE DRAINAGE BAGS You will be given two drainage bags to take home. One is a large overnight drainage bag, and the other is a smaller leg bag that fits underneath clothing. You may wear the overnight bag at any time, but you should never wear the smaller leg bag at night. Follow the instructions below for how to empty, change, and clean your drainage bags. Emptying the Drainage Bag You must empty your  drainage bag when it is  - full or at least 2-3 times a day. 1. Wash your hands with soap and water. 2. Keep the drainage bag below your hips, below the level of your bladder. This stops urine from going back into the tubing and into your bladder. 3. Hold the dirty bag over the toilet or a clean container. 4. Open the pour spout at the bottom of the bag and empty the urine into the toilet or container. Do not let the pour spout touch  the toilet, container, or any other surface. Doing so can place bacteria on the bag, which can cause an infection. 5. Clean the pour spout with a gauze pad or cotton ball that has rubbing alcohol on it. 6. Close the pour spout. 7. Attach the bag to your leg with adhesive tape or a leg strap. 8. Wash your hands well. Changing the Drainage Bag Change your drainage bag once a month or sooner if it starts to smell bad or look dirty. Below are steps to follow when changing the drainage bag. 1. Wash your hands with soap and water. 2. Pinch off the rubber catheter so that urine does not spill out. 3. Disconnect the catheter tube from the drainage tube at the connection valve. Do not let the tubes touch any surface. 4. Clean the end of the catheter tube with an alcohol wipe. Use a different alcohol wipe to clean the end of the drainage tube. 5. Connect the catheter tube to the drainage tube of the clean drainage bag. 6. Attach the new bag to the leg with adhesive tape or a leg strap. Avoid attaching the new bag too tightly. 7. Wash your hands well. Cleaning the Drainage Bag 1. Wash your hands with soap and water. 2. Wash the bag in warm, soapy water. 3. Rinse the bag thoroughly with warm water. 4. Fill the bag with a solution of white vinegar and water (1 cup vinegar to 1 qt warm water [.2 L vinegar to 1 L warm water]). Close the bag and soak it for 30 minutes in the solution. 5. Rinse the bag with warm water. 6. Hang the bag to dry with the pour spout open and hanging downward. 7. Store the clean bag (once it is dry) in a clean plastic bag. 8. Wash your hands well. PREVENTING INFECTION  Wash your hands before and after handling your catheter.  Take showers daily and wash the area where the catheter enters your body. Do not take baths. Replace wet leg straps with dry ones, if this applies.  Do not use powders, sprays, or lotions on the genital area. Only use creams, lotions, or ointments as  directed by your caregiver.  For females, wipe from front to back after each bowel movement.  Drink enough fluids to keep your urine clear or pale yellow unless you have a fluid restriction.  Do not let the drainage bag or tubing touch or lie on the floor.  Wear cotton underwear to absorb moisture and to keep your skin drier. SEEK MEDICAL CARE IF:   Your urine is cloudy or smells unusually bad.  Your catheter becomes clogged.  You are not draining urine into the bag or your bladder feels full.  Your catheter starts to leak. SEEK IMMEDIATE MEDICAL CARE IF:   You have pain, swelling, redness, or pus where the catheter enters the body.  You have pain in the abdomen, legs, lower back, or bladder.  You have a fever.  You  see blood fill the catheter, or your urine is pink or red.  You have nausea, vomiting, or chills.  Your catheter gets pulled out. MAKE SURE YOU:   Understand these instructions.  Will watch your condition.  Will get help right away if you are not doing well or get worse.   This information is not intended to replace advice given to you by your health care provider. Make sure you discuss any questions you have with your health care provider.   Document Released: 05/07/2005 Document Revised: 09/21/2013 Document Reviewed: 04/28/2012 Elsevier Interactive Patient Education [employer].

## 2016-02-13 NOTE — Progress Notes (Signed)
Foley with thick, red drainage. Dr. [name] in and irrigated cath.

## 2016-02-13 NOTE — Anesthesia Postprocedure Evaluation (Signed)
Anesthesia Post Note  Patient: Eric [name]  Procedure(s) Performed: Procedure(s) (LRB): TRANSURETHRAL RESECTION OF BLADDER TUMOR (TURBT) (N/A)  Patient location during evaluation: PACU Anesthesia Type: General Level of consciousness: awake Pain management: pain level controlled Vital Signs Assessment: post-procedure vital signs reviewed and stable Respiratory status: spontaneous breathing Cardiovascular status: stable Postop Assessment: no signs of nausea or vomiting Anesthetic complications: no    Last Vitals:  Vitals:   02/13/16 1430 02/13/16 1445  BP: (!) 151/69 (!) 160/59  Pulse: (!) 50 (!) 56  Resp: (!) 24 16  Temp:      Last Pain:  Vitals:   02/13/16 1430  TempSrc:   PainSc: 0-No pain                 [name]

## 2016-02-13 NOTE — Anesthesia Preprocedure Evaluation (Signed)
Anesthesia Evaluation  Patient identified by MRN, date of birth, ID band Patient awake    Reviewed: Allergy & Precautions, NPO status , Patient's Chart, lab work & pertinent test results  Airway Mallampati: I  TM Distance: >3 FB Neck ROM: Full    Dental  (+) Teeth Intact   Pulmonary neg pulmonary ROS,    breath sounds clear to auscultation       Cardiovascular + CAD  + Valvular Problems/Murmurs AS  Rhythm:Irregular Rate:Normal + Systolic murmurs AVR in [id number] , Afib Good exercsise tolerance now   Neuro/Psych negative neurological ROS     GI/Hepatic negative GI ROS, Neg liver ROS,   Endo/Other  negative endocrine ROS  Renal/GU      Musculoskeletal   Abdominal   Peds  Hematology negative hematology ROS (+)   Anesthesia Other Findings   Reproductive/Obstetrics                             Anesthesia Physical Anesthesia Plan  ASA: III  Anesthesia Plan: General   Post-op Pain Management:    Induction: Intravenous  Airway Management Planned: LMA  Additional Equipment:   Intra-op Plan:   Post-operative Plan: Extubation in OR  Informed Consent: I have reviewed the patients History and Physical, chart, labs and discussed the procedure including the risks, benefits and alternatives for the proposed anesthesia with the patient or authorized representative who has indicated his/her understanding and acceptance.     Plan Discussed with: CRNA  Anesthesia Plan Comments:         Anesthesia Quick Evaluation

## 2016-02-13 NOTE — Transfer of Care (Signed)
Immediate Anesthesia Transfer of Care Note  Patient: Eric [name]  Procedure(s) Performed: Procedure(s) (LRB): TRANSURETHRAL RESECTION OF BLADDER TUMOR (TURBT) (N/A)  Patient Location: PACU  Anesthesia Type: General  Level of Consciousness: awake, sedated, patient cooperative and responds to stimulation  Airway & Oxygen Therapy: Patient Spontanous Breathing and Patient connected to face mask oxygen  Post-op Assessment: Report given to PACU RN, Post -op Vital signs reviewed and stable and Patient moving all extremities  Post vital signs: Reviewed and stable  Complications: No apparent anesthesia complications

## 2016-02-13 NOTE — Op Note (Signed)
Date of procedure: 02/13/16  Preoperative diagnosis:  1. Bladder tumor 2. Gross hematuria   Postoperative diagnosis:  1. Bladder tumor 2. Gross hematuria 3. 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 Transurethral resection of bladder tumor (6 cm in total, largest tumor 3 cm) 2. Fulguration of abnormal bladder wall  Surgeon: Baruch Gouty, MD  Anesthesia: General  Complications: None  Intraoperative findings: The patient had 4 discrete tumors that were approximately 7 cm in total size. The largest was 3 cm. One was located at the 6:00 position near the bladder neck. Another was located in the right lateral wall distal to the ureteral orifice. He had one on the left lateral wall. A fourth was located 11:00 position just proximal to the bladder neck. These were resected. Muscle was visible in the specimen as well as on cystoscopy. He also had diffuse abnormal appearing bladder wall is concerning for carcinoma in situ. He also had hypervascularity and friability of this wall. This was located through the majority of the bladder but particularly on the left side and near the dome. Superficial biopsies were removed with the resectoscope from these areas to help evaluate for CIS. All tumor was sent in one container.  EBL: Minimal  Specimens: Bladder tumor to pathology  Drains: 20 French Foley catheter  Disposition: Stable to the postanesthesia care unit  Indication for procedure: The patient is a 82 y.o. male who presented with gross hematuria and was found to have multiple bladder tumors presents today for definitive resection..  After reviewing the management options for treatment, the patient elected to proceed with the above surgical procedure(s). We have discussed the potential benefits and risks of the procedure, side effects of the proposed treatment, the likelihood of the patient achieving the goals of the procedure, and any potential problems that might  occur during the procedure or recuperation. Informed consent has been obtained.  Description of procedure: The patient was met in the preoperative area. All risks, benefits, and indications of the procedure were described in great detail. The patient consented to the procedure. Preoperative antibiotics were given. The patient was taken to the operative theater. General anesthesia was induced per the anesthesia service. The patient was then placed in the dorsal lithotomy position and prepped and draped in the usual sterile fashion. A preoperative timeout was called.    A 24 French 30 cystoscope was inserted into the patient's bladder per urethra atraumatically. This was done with the visual obturator. Pan cystoscopy revealed the above findings. The tumors were resected down to muscle. The areas concerning for CIS were superficially resected for a biopsy. Hemostasis was then obtained and was excellent. The areas of concern that were friable throughout the bladder for CIS were fulgurated. This was about 40% the bladder. Hemostasis was then obtained. His excellent helical evacuator was used to remove bladder chips. The ureteral orifices were intact at the end the procedure. The resector was then removed and 20 [place] Foley was placed with clear return of urine. The patient was awoken from anesthesia and transferred in stable condition to the postanesthesia care unit.  Plan: The patient will continue his Foley catheter until his follow-up appointment in 1 week. We'll discuss pathology results and the next step based on the next step based on these results. At a minimum, he will need BCG therapy for his multi focal tumors and likely CIS.  Baruch Gouty, M.D.

## 2016-02-13 NOTE — H&P (Signed)
CC: I have blood in my urine.  HPI: Eric [name] is a 82 year-old male established patient who is here for blood in the urine.  He did see the blood in his urine. He has not seen blood clots.   He does not have a burning sensation when he urinates. He is not currently having trouble urinating.   He has had kidney stones. He is not having pain.   CT normal except for small stone burden in right lower pole     ALLERGIES: None   MEDICATIONS: Metoprolol Succinate 25 mg tablet, extended release 24 hr  Atorvastatin Calcium 40 mg tablet  Eliquis 5 mg tablet     GU PSH: Locm 300-399Mg /Ml Iodine,1Ml - 01/17/2016 Radical Prostatectomy, In [place] - 2000, in [place], per patient last psa was 0.0 - 2000    NON-GU PSH: Heart Surgery (Unspecified), open procedure - 2009    GU PMH: Gross hematuria - 01/09/2016 Kidney Stone - 01/09/2016 Prostate Cancer - 01/09/2016    NON-GU PMH: Cardiac murmur, unspecified Pure hypercholesterolemia, unspecified    FAMILY HISTORY: father deceased at age 33 - Father leukemia - Father mother deceased at age 83 - Mother   SOCIAL HISTORY: Marital Status: Married Current Smoking Status: Patient has never smoked.  Drinks 2 drinks per week.  Drinks 2 caffeinated drinks per day. Patient's occupation is/was retired.    REVIEW OF SYSTEMS:    GU Review Male:   Patient reports get up at night to urinate. Patient denies frequent urination, hard to postpone urination, burning/ pain with urination, leakage of urine, stream starts and stops, trouble starting your stream, have to strain to urinate , erection problems, and penile pain.  Gastrointestinal (Upper):   Patient denies nausea, vomiting, and indigestion/ heartburn.  Gastrointestinal (Lower):   Patient denies diarrhea and constipation.  Constitutional:   Patient denies fever, night sweats, weight loss, and fatigue.  Skin:   Patient denies skin rash/ lesion and itching.  Eyes:   Patient denies blurred vision  and double vision.  Ears/ Nose/ Throat:   Patient denies sore throat and sinus problems.  Hematologic/Lymphatic:   Patient reports easy bruising. Patient denies swollen glands.  Cardiovascular:   Patient reports leg swelling. Patient denies chest pains.  Respiratory:   Patient denies cough and shortness of breath.  Endocrine:   Patient denies excessive thirst.  Musculoskeletal:   Patient denies joint pain and back pain.  Neurological:   Patient denies headaches and dizziness.  Psychologic:   Patient denies depression and anxiety.   VITAL SIGNS:      01/24/2016 08:46 AM  BP 169/53 mmHg  Pulse 50 /min  Temperature 97.8 F / 37 C   MULTI-SYSTEM PHYSICAL EXAMINATION:    Constitutional: Well-nourished. No physical deformities. Normally developed. Good grooming.  Neck: Neck symmetrical, not swollen. Normal tracheal position.  Respiratory: No labored breathing, no use of accessory muscles.   Cardiovascular: Normal temperature, normal extremity pulses, no swelling, no varicosities.  Skin: No paleness, no jaundice, no cyanosis. No lesion, no ulcer, no rash.  Gastrointestinal: No mass, no tenderness, no rigidity, non obese abdomen.  Eyes: Normal conjunctivae. Normal eyelids.  Ears, Nose, Mouth, and Throat: Left ear no scars, no lesions, no masses. Right ear no scars, no lesions, no masses. Nose no scars, no lesions, no masses. Normal hearing. Normal lips.  Musculoskeletal: Normal gait and station of head and neck.     PAST DATA REVIEWED:  Source Of History:  Patient  X-Ray Review: C.T. Hematuria:  Reviewed Films. Reviewed Report. Discussed With Patient.     PROCEDURES:         Flexible Cystoscopy - 52000  Risks, benefits, and some of the potential complications of the procedure were discussed at length with the patient including infection, bleeding, voiding discomfort, urinary retention, fever, chills, sepsis, and others. All questions were answered. Informed consent was obtained. Antibiotic  prophylaxis was given. Sterile technique and intraurethral analgesia were used.  Meatus:  Normal size. Normal location. Normal condition.  Urethra:  No strictures.  External Sphincter:  Normal.  Verumontanum:  Verumontanum Surgically Absent.  Prostate:  Prostate Surgically Absent.  Bladder Neck:  Non-obstructing.  Ureteral Orifices:  Normal location. Normal size. Normal shape. Effluxed clear urine.  Bladder:  Multifocal tumors. No trabeculation. Normal mucosa. No stones. multiple tumors at dome and lateral walls of bladder      The lower urinary tract was carefully examined. The procedure was well-tolerated and without complications. Antibiotic instructions were given. Instructions were given to call the office immediately for bloody urine, difficulty urinating, urinary retention, painful or frequent urination, fever, chills, nausea, vomiting or other illness. The patient stated that he understood these instructions and would comply with them.         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 The relationship between smoking and bladder cancer was discussed. Etiologies of bladder cancer were discussed with the patient.   Treatment options were discussed including the initial resection of the tumor(s), radical cystectomy both  open and roboitc assisted, along with chemotherapy, immunotherapy, and radiation therapy. Pathological grades of bladder cancer, stages of bladder cancer including superficial and muscle invasive disease were discussed in detail.   The risks, benefits, and some of the possible complications of transurethral resection of the bladder tumor were discussed with the patient including the anticipated cure rate, the possible need for additional surgical procedures, adjuvant chemotherapy, radiation therapy or immunotherapy. The possibility that this operative procedure might not to cure the underlying disease, that micrometastatic disease might already be present, and that this underlying disease might result in the death of the patient was discussed.  The general risks of the operative procedure and the perioperative period were discussed with the patient at length and in detail including swelling, pain, nausea, vomiting, fever, chills, infection, wound infection, sepsis, renal failure, internal or external bleeding, intraoperative bowel, organ or vascular injuries, postoperative formation of scar tissue, the need for blood transfusions, deep venous thrombosis or blood clots, pulmonary embolus, pneumonia, respiratory failure, heart attack, stroke, death and others.   Aternative treatment options were discussed with the patient in detail. All of the patient's questions  were answered and he voiced an understanding of these risks, benefits and possible complications.   The patient gave fully informed consent to proceed with a transurethral resection of their bladder tumor for the treatment of thier bladder cancer.        Notes:   plan for TURBT

## 2016-02-13 NOTE — H&P (Deleted)
CC: I have blood in my urine.  HPI: Eric [name] is a 82 year-old male established patient who is here for blood in the urine.  He did see the blood in his urine. He has not seen blood clots.   He does not have a burning sensation when he urinates. He is not currently having trouble urinating.   He has had kidney stones. He is not having pain.   CT normal except for small stone burden in right lower pole     ALLERGIES: None   MEDICATIONS: Metoprolol Succinate 25 mg tablet, extended release 24 hr  Atorvastatin Calcium 40 mg tablet  Eliquis 5 mg tablet     GU PSH: Locm 300-399Mg /Ml Iodine,1Ml - 01/17/2016 Radical Prostatectomy, In [place] - 2000, in [place], per patient last psa was 0.0 - 2000    NON-GU PSH: Heart Surgery (Unspecified), open procedure - 2009    GU PMH: Gross hematuria - 01/09/2016 Kidney Stone - 01/09/2016 Prostate Cancer - 01/09/2016    NON-GU PMH: Cardiac murmur, unspecified Pure hypercholesterolemia, unspecified    FAMILY HISTORY: father deceased at age 66 - Father leukemia - Father mother deceased at age 33 - Mother   SOCIAL HISTORY: Marital Status: Married Current Smoking Status: Patient has never smoked.  Drinks 2 drinks per week.  Drinks 2 caffeinated drinks per day. Patient's occupation is/was retired.    REVIEW OF SYSTEMS:    GU Review Male:   Patient reports get up at night to urinate. Patient denies frequent urination, hard to postpone urination, burning/ pain with urination, leakage of urine, stream starts and stops, trouble starting your stream, have to strain to urinate , erection problems, and penile pain.  Gastrointestinal (Upper):   Patient denies nausea, vomiting, and indigestion/ heartburn.  Gastrointestinal (Lower):   Patient denies diarrhea and constipation.  Constitutional:   Patient denies fever, night sweats, weight loss, and fatigue.  Skin:   Patient denies skin rash/ lesion and itching.  Eyes:   Patient denies blurred vision  and double vision.  Ears/ Nose/ Throat:   Patient denies sore throat and sinus problems.  Hematologic/Lymphatic:   Patient reports easy bruising. Patient denies swollen glands.  Cardiovascular:   Patient reports leg swelling. Patient denies chest pains.  Respiratory:   Patient denies cough and shortness of breath.  Endocrine:   Patient denies excessive thirst.  Musculoskeletal:   Patient denies joint pain and back pain.  Neurological:   Patient denies headaches and dizziness.  Psychologic:   Patient denies depression and anxiety.   VITAL SIGNS:      01/24/2016 08:46 AM  BP 169/53 mmHg  Pulse 50 /min  Temperature 97.8 F / 37 C   MULTI-SYSTEM PHYSICAL EXAMINATION:    Constitutional: Well-nourished. No physical deformities. Normally developed. Good grooming.  Neck: Neck symmetrical, not swollen. Normal tracheal position.  Respiratory: No labored breathing, no use of accessory muscles.   Cardiovascular: Normal temperature, normal extremity pulses, no swelling, no varicosities.  Skin: No paleness, no jaundice, no cyanosis. No lesion, no ulcer, no rash.  Gastrointestinal: No mass, no tenderness, no rigidity, non obese abdomen.  Eyes: Normal conjunctivae. Normal eyelids.  Ears, Nose, Mouth, and Throat: Left ear no scars, no lesions, no masses. Right ear no scars, no lesions, no masses. Nose no scars, no lesions, no masses. Normal hearing. Normal lips.  Musculoskeletal: Normal gait and station of head and neck.     PAST DATA REVIEWED:  Source Of History:  Patient  X-Ray Review: C.T. Hematuria:  Reviewed Films. Reviewed Report. Discussed With Patient.     PROCEDURES:         Flexible Cystoscopy - 52000  Risks, benefits, and some of the potential complications of the procedure were discussed at length with the patient including infection, bleeding, voiding discomfort, urinary retention, fever, chills, sepsis, and others. All questions were answered. Informed consent was obtained. Antibiotic  prophylaxis was given. Sterile technique and intraurethral analgesia were used.  Meatus:  Normal size. Normal location. Normal condition.  Urethra:  No strictures.  External Sphincter:  Normal.  Verumontanum:  Verumontanum Surgically Absent.  Prostate:  Prostate Surgically Absent.  Bladder Neck:  Non-obstructing.  Ureteral Orifices:  Normal location. Normal size. Normal shape. Effluxed clear urine.  Bladder:  Multifocal tumors. No trabeculation. Normal mucosa. No stones. multiple tumors at dome and lateral walls of bladder      The lower urinary tract was carefully examined. The procedure was well-tolerated and without complications. Antibiotic instructions were given. Instructions were given to call the office immediately for bloody urine, difficulty urinating, urinary retention, painful or frequent urination, fever, chills, nausea, vomiting or other illness. The patient stated that he understood these instructions and would comply with them.         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 The relationship between smoking and bladder cancer was discussed. Etiologies of bladder cancer were discussed with the patient.   Treatment options were discussed including the initial resection of the tumor(s), radical cystectomy both  open and roboitc assisted, along with chemotherapy, immunotherapy, and radiation therapy. Pathological grades of bladder cancer, stages of bladder cancer including superficial and muscle invasive disease were discussed in detail.   The risks, benefits, and some of the possible complications of transurethral resection of the bladder tumor were discussed with the patient including the anticipated cure rate, the possible need for additional surgical procedures, adjuvant chemotherapy, radiation therapy or immunotherapy. The possibility that this operative procedure might not to cure the underlying disease, that micrometastatic disease might already be present, and that this underlying disease might result in the death of the patient was discussed.  The general risks of the operative procedure and the perioperative period were discussed with the patient at length and in detail including swelling, pain, nausea, vomiting, fever, chills, infection, wound infection, sepsis, renal failure, internal or external bleeding, intraoperative bowel, organ or vascular injuries, postoperative formation of scar tissue, the need for blood transfusions, deep venous thrombosis or blood clots, pulmonary embolus, pneumonia, respiratory failure, heart attack, stroke, death and others.   Aternative treatment options were discussed with the patient in detail. All of the patient's questions  were answered and he voiced an understanding of these risks, benefits and possible complications.   The patient gave fully informed consent to proceed with a transurethral resection of their bladder tumor for the treatment of thier bladder cancer.        Notes:   plan for TURBT

## 2016-02-14 ENCOUNTER — Encounter (HOSPITAL_BASED_OUTPATIENT_CLINIC_OR_DEPARTMENT_OTHER): Payer: Self-pay | Admitting: Urology

## 2016-03-18 ENCOUNTER — Other Ambulatory Visit: Payer: Self-pay | Admitting: Interventional Cardiology

## 2016-03-19 ENCOUNTER — Encounter: Payer: Self-pay | Admitting: Physician Assistant

## 2016-03-19 ENCOUNTER — Other Ambulatory Visit: Payer: Self-pay

## 2016-03-19 ENCOUNTER — Telehealth: Payer: Self-pay | Admitting: Interventional Cardiology

## 2016-03-19 ENCOUNTER — Ambulatory Visit (HOSPITAL_COMMUNITY)
Admission: RE | Admit: 2016-03-19 | Discharge: 2016-03-19 | Disposition: A | Discharge status: Home / Self Care | Payer: Medicare Other | Source: Ambulatory Visit | Attending: Physician Assistant | Admitting: Physician Assistant

## 2016-03-19 ENCOUNTER — Ambulatory Visit (INDEPENDENT_AMBULATORY_CARE_PROVIDER_SITE_OTHER): Payer: Medicare Other | Admitting: Physician Assistant

## 2016-03-19 ENCOUNTER — Telehealth: Payer: Self-pay | Admitting: Physician Assistant

## 2016-03-19 ENCOUNTER — Encounter (HOSPITAL_COMMUNITY): Payer: Self-pay

## 2016-03-19 VITALS — BP 115/60 | HR 67 | Ht 72.0 in | Wt 193.4 lb

## 2016-03-19 DIAGNOSIS — I059 Rheumatic mitral valve disease, unspecified: Secondary | ICD-10-CM

## 2016-03-19 DIAGNOSIS — Z952 Presence of prosthetic heart valve: Secondary | ICD-10-CM | POA: Diagnosis not present

## 2016-03-19 DIAGNOSIS — I2510 Atherosclerotic heart disease of native coronary artery without angina pectoris: Secondary | ICD-10-CM

## 2016-03-19 DIAGNOSIS — R609 Edema, unspecified: Secondary | ICD-10-CM | POA: Diagnosis not present

## 2016-03-19 DIAGNOSIS — Z9889 Other specified postprocedural states: Secondary | ICD-10-CM | POA: Diagnosis not present

## 2016-03-19 DIAGNOSIS — R600 Localized edema: Secondary | ICD-10-CM

## 2016-03-19 DIAGNOSIS — I4892 Unspecified atrial flutter: Secondary | ICD-10-CM

## 2016-03-19 NOTE — Progress Notes (Deleted)
Cardiology Office Note    Date:  03/19/2016  ID:  Eric [name], DOB 24-Feb-1930, MRN [id number] PCP:  Irven Shelling, MD  Cardiologist: Irish Lack   Chief Complaint: edema  History of Present Illness:  Eric [name] is a 82 y.o. male with history of AS s/p tissue AVR and CABGx1 (SVG-OM) in 2009, mitral valve ring annuloplasty at time of above surgery in 2009, h/o DVT and PEs, atrial flutter, chronic thrombopcytopenia per labs, prostate CA, bladder CA who presents for evaluation of LEE. 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 Last echo 03/2014 (TEE): EF 50-55%, no rWMA, mod LAE, mild RAE, mild-mod TR. Labs 02/13/16: Cr 0.96, K 4.0, Hgb 14, plt 114 (chronically low).     Past Medical History:  Diagnosis Date  . Atrial flutter, paroxysmal ([place])    a. dx 04-02-2014, s/p  successful cardioversion 04-06-2014.  . Bladder tumor   . Chronic anticoagulation    on Eliquis--  due to recurrent dvt's and pe's  . Dyslipidemia   . History of basal cell carcinoma excision    06/ 2012  left upper back;   right forehead 07/ 2014  . History of DVT (deep vein thrombosis)    2000-- RLE  . History of melanoma excision    left flank;  07/ 2014 left lower leg and right upper chest  . History of prostate cancer    Gleason 6--  s/p  radical prostatectomy 06/ 2000 in [place]---  no recurrence  . History of pulmonary embolus (PE)    2000 & 2005  bilateral  . Hx of valvuloplasty 06/19/2007   a. s/p mitral ring annuloplasty, repair ruptured chordae of P2 flail segments of MV  . S/P aortic valve replacement with bioprosthetic valve    06-19-2007  severe aortic valve stenosis  . S/P CABG x 1    06-19-2007  SVG to OM1  . Single vessel coronary artery disease 06/19/2007---   cardiologist-  dr Irish Lack   a. 05/2007 CABG x 1:  s/p SVG-OM;  b. 10/2010 Ex MV: EF 72%, inf attenuation w/o ischemia, brief run of PAT with exercise. To  . Thyroid goiter 2013   nodular    Past Surgical History:  Procedure Laterality Date  . AORTIC VALVE REPLACEMENT (AVR)/CORONARY ARTERY BYPASS GRAFTING (CABG)  06-19-2007  dr [name]   SVG to OM1 ;   AVR w/ #23 Mitralflow pericardial and ligation left atrial appendage;  MV repair w/ #28 Sorin 3-D memo Ring Annuloplasty with repair ruptured chordar of P-2 flail segments  . CARDIAC CATHETERIZATION  05-12-2007  dr Leonia Reeves   single vessel 30% ostial LAD,  80% LCx;  severe to critial AS,  moderate MR,  normal LVF, ef 70%,  normal right heart pressures and cardiac outputs,  mild elevated LV end-diastolic pressure    . CARDIOVASCULAR STRESS TEST  11-02-2010  dr Irish Lack   normal nuclear study w/ no ischemia or infarct/scar/  normal LV function and wall motion , ef 72%  . CARDIOVERSION N/A 04/06/2014   Procedure: CARDIOVERSION;  Surgeon: [name], MD;  Location: [facility];  Service: Cardiovascular;  Laterality: N/A;   successful  . CATARACT EXTRACTION W/ INTRAOCULAR LENS  IMPLANT, BILATERAL  2012  approx  . RETROPUBIC RADICAL PROSTATECTOMY  06/ 2000  in [place], [place]  . ROTATOR CUFF REPAIR Left  09/1998  . TEE WITHOUT CARDIOVERSION N/A 04/06/2014   Procedure: TRANSESOPHAGEAL ECHOCARDIOGRAM (TEE);  Surgeon: [name], MD;  Location[facility] ENDOSCOPY;  Service: Cardiovascular;  Laterality: N/A;  mild focal basa LVH of the septum, ef 50-55%, s/p AV annuloplasty ring, elongated chordae, thickened leaflets, mild to mod. MR, multiple small jets/arotic bioprosthetic valve sits well in position, mild AI/ thickened TV w/ mild reurg./mild LA  . TRANSTHORACIC ECHOCARDIOGRAM  05/04/2013   mild focal basal LVH of septum, ef 60-65%/  bioprosthetic AV w/ mild regurg./  mild thickened MV leaflets with elongated chords w/ mild reurg./  mild to moderate LAE and mild RAE/  mild increase PASP/  mild TR  .  TRANSURETHRAL RESECTION OF BLADDER TUMOR N/A 02/13/2016   Procedure: TRANSURETHRAL RESECTION OF BLADDER TUMOR (TURBT);  Surgeon: Nickie Retort, MD;  Location: [facility];  Service: Urology;  Laterality: N/A;    Current Medications: Current Outpatient Prescriptions  Medication Sig Dispense Refill  . amoxicillin (AMOXIL) 500 MG capsule Take 500 mg by mouth as needed (DENTAL APPTS.).   0  . apixaban (ELIQUIS) 5 MG TABS tablet Take 1 tablet (5 mg total) by mouth 2 (two) times daily. 180 tablet 3  . atorvastatin (LIPITOR) 40 MG tablet Take 40 mg by mouth every evening.     . cephALEXin (KEFLEX) 500 MG capsule Take 1 capsule (500 mg total) by mouth 3 (three) times daily. 9 capsule 0  . ezetimibe (ZETIA) 10 MG tablet Take 10 mg by mouth every evening.     Marland Kitchen HYDROcodone-acetaminophen (NORCO) 5-325 MG tablet Take 1 tablet by mouth every 4 (four) hours as needed for moderate pain. 30 tablet 0  . metoprolol tartrate (LOPRESSOR) 25 MG tablet Take 12.5 mg by mouth 2 (two) times daily.      No current facility-administered medications for this visit.      Allergies:   Review of patient's allergies indicates no known allergies.   Social History   Social History  . Marital status: Married    Spouse name: N/A  . Number of children: N/A  . Years of education: N/A   Social History Main Topics  . Smoking status: Never Smoker  . Smokeless tobacco: Never Used  . Alcohol use 0.5 - 1.0 oz/week    1 - 2 Standard drinks or equivalent per week  . Drug use: No  . Sexual activity: Not on file   Other Topics Concern  . Not on file   Social History Narrative   Lives in [place] with wife.  Active.     Family History:  The patient's family history includes Leukemia in his mother. ***  ROS:   Please see the history of present illness. Otherwise, review of systems is positive for ***.  All other systems are reviewed and otherwise negative.    PHYSICAL EXAM:   VS:  There were no vitals  taken for this visit.  BMI: There is no height or weight on file to calculate BMI. GEN: Well nourished, well developed, in no acute distress HEENT: normocephalic, atraumatic Neck: no JVD, carotid bruits, or masses Cardiac: ***RRR; no murmurs, rubs, or gallops, no edema  Respiratory:  clear to auscultation bilaterally, normal work of breathing GI: soft, nontender, nondistended, + BS MS: no deformity or atrophy Skin: warm and dry, no rash Neuro:  Alert and Oriented x 3, Strength and sensation are intact, follows commands Psych: euthymic mood, full affect  Wt Readings from Last 3 Encounters:  02/13/16 191  lb 8 oz (86.9 kg)  07/26/15 206 lb 9.6 oz (93.7 kg)  08/09/14 209 lb 1.9 oz (94.9 kg)      Studies/Labs Reviewed:   EKG:  EKG was ordered today and personally reviewed by me and demonstrates *** EKG was not ordered today.***  Recent Labs: 02/13/2016: BUN 22; Creatinine, Ser 0.96; Hemoglobin 14.1; Platelets 114; Potassium 4.0; Sodium 141   Lipid Panel No results found for: CHOL, TRIG, HDL, CHOLHDL, VLDL, LDLCALC, LDLDIRECT  Additional studies/ records that were reviewed today include: Summarized above.***    ASSESSMENT & PLAN:   1. Lower extremity edema 2. Paroxysmal atrial flutter 3. H/o AVR, MVR 4. CAD s/p CABG  Disposition: F/u with ***   Medication Adjustments/Labs and Tests Ordered: Current medicines are reviewed at length with the patient today.  Concerns regarding medicines are outlined above. Medication changes, Labs and Tests ordered today are summarized above and listed in the Patient Instructions accessible in Encounters.   Raechel Ache PA-C  03/19/2016 12:08 PM    [facility] [address], [place] [place], [place]  [address] Phone: [phone]; Fax: [phone]

## 2016-03-19 NOTE — Telephone Encounter (Signed)
LE doppler is negative for DVT. Will get echo and BNP to r/o CHF.

## 2016-03-19 NOTE — Telephone Encounter (Signed)
New Message   Pt wife call requesting to speak with RN about getting pt seen today by Dr. Irish Lack. Pt wife states pt right left; foot to knee has been swelling over the weekend and wife wants pt to be seen today. Please call back to dicuss

## 2016-03-19 NOTE — Progress Notes (Signed)
*  Preliminary Results* Right lower extremity venous duplex completed. Right lower extremity is negative for deep vein thrombosis. There is no evidence of right Baker's cyst.  03/19/2016 4:38 PM  Maudry Mayhew, BS, RVT, RDCS, RDMS

## 2016-03-19 NOTE — Progress Notes (Signed)
Cardiology Office Note    Date:  03/19/2016   ID:  Eric [name], DOB 12-Apr-1930, MRN [id number]  PCP:  Eric Shelling, MD  Cardiologist:  Dr. Irish Lack  Chief Complaint: Marin Comment Edema  History of Present Illness:   Eric [name] is a 82 y.o. male with history of AS s/p tissue AVR and CABGx1 (SVG-OM) in 2009, mitral valve ring annuloplasty at time of above surgery in 2009, h/o DVT and PEs, atrial flutter, chronic thrombopcytopenia per labs, prostate CA, and bladder CA who presents for evaluation of LE edema.    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 Last echo 03/2014 (TEE): EF 50-55%, no rWMA, mod LAE, mild RAE, mild-mod TR. Labs 02/13/16: Cr 0.96, K 4.0, Hgb 14, plt 114 (chronically low).   The patient had thumb size blood clot in urine 2 weeks ago multiple times. Called on call urologist and advised to hold Eliquis for 4 days. Seen by urologist Dr. [name] and normal scope in clinic. No reoccurrence since then.   The patient has noted R leg gradual worsening of LE edema since surgery in September. Recently worsen for the past 5 days. The patient denies nausea, vomiting, fever, chest pain, palpitations, shortness of breath, orthopnea, PND, dizziness, syncope, cough, congestion, abdominal pain, hematochezia, melena. This is similar when he had DVT of right leg in 2001 and 2005. Does complain of sight pain with walking.   Past Medical History:  Diagnosis Date  . Atrial flutter, paroxysmal ([place])    a. dx 04-02-2014, s/p  successful cardioversion 04-06-2014.  . Bladder tumor   . Chronic anticoagulation    on Eliquis--  due to recurrent dvt's and pe's  . Dyslipidemia   . History of basal cell carcinoma excision    06/ 2012  left upper back;   right forehead 07/ 2014  . History of DVT (deep vein thrombosis)    2000-- RLE  . History of melanoma excision    left flank;  07/ 2014 left lower leg and right upper chest  . History of prostate cancer    Gleason 6--  s/p  radical prostatectomy 06/ 2000 in [place]---  no recurrence  . History of pulmonary embolus (PE)    2000 & 2005  bilateral  . Hx of valvuloplasty 06/19/2007   a. s/p mitral ring annuloplasty, repair ruptured chordae of P2 flail segments of MV  . S/P aortic valve replacement with bioprosthetic valve    06-19-2007  severe aortic valve stenosis  . S/P CABG x 1    06-19-2007  SVG to OM1  . Single vessel coronary artery disease 06/19/2007---   cardiologist-  dr Irish Lack   a. 05/2007 CABG x 1: s/p SVG-OM;  b. 10/2010 Ex MV: EF 72%, inf attenuation w/o ischemia, brief run of PAT with exercise. To  . Thrombocytopenia ([place])   . Thyroid goiter 2013   nodular    Past Surgical History:  Procedure Laterality Date  . AORTIC VALVE REPLACEMENT (AVR)/CORONARY ARTERY BYPASS GRAFTING (CABG)  06-19-2007  dr [name]   SVG to OM1 ;   AVR w/ #23 Mitralflow pericardial and ligation left atrial appendage;  MV repair w/ #28 Sorin 3-D memo Ring Annuloplasty with repair ruptured chordar of P-2 flail segments  . CARDIAC CATHETERIZATION  05-12-2007  dr Leonia Reeves   single vessel 30% ostial LAD,  80% LCx;  severe to critial AS,  moderate MR,  normal LVF, ef 70%,  normal right heart pressures and cardiac outputs,  mild elevated LV end-diastolic pressure    . CARDIOVASCULAR STRESS TEST  11-02-2010  dr Irish Lack   normal nuclear study w/ no ischemia or infarct/scar/  normal LV function and wall motion , ef 72%  . CARDIOVERSION N/A 04/06/2014   Procedure: CARDIOVERSION;  Surgeon: [name], MD;  Location: [facility];  Service: Cardiovascular;  Laterality: N/A;   successful  . CATARACT EXTRACTION W/ INTRAOCULAR LENS  IMPLANT, BILATERAL  2012  approx  . RETROPUBIC RADICAL PROSTATECTOMY  06/ 2000  in [place], [place] Left 09/1998  . TEE WITHOUT  CARDIOVERSION N/A 04/06/2014   Procedure: TRANSESOPHAGEAL ECHOCARDIOGRAM (TEE);  Surgeon: [name], MD;  Location: [facility] ENDOSCOPY;  Service: Cardiovascular;  Laterality: N/A;  mild focal basa LVH of the septum, ef 50-55%, s/p AV annuloplasty ring, elongated chordae, thickened leaflets, mild to mod. MR, multiple small jets/arotic bioprosthetic valve sits well in position, mild AI/ thickened TV w/ mild reurg./mild LA  . TRANSTHORACIC ECHOCARDIOGRAM  05/04/2013   mild focal basal LVH of septum, ef 60-65%/  bioprosthetic AV w/ mild regurg./  mild thickened MV leaflets with elongated chords w/ mild reurg./  mild to moderate LAE and mild RAE/  mild increase PASP/  mild TR  . TRANSURETHRAL RESECTION OF BLADDER TUMOR N/A 02/13/2016   Procedure: TRANSURETHRAL RESECTION OF BLADDER TUMOR (TURBT);  Surgeon: Nickie Retort, MD;  Location: [facility];  Service: Urology;  Laterality: N/A;    Current Medications: Prior to Admission medications   Medication Sig Start Date End Date Taking? Authorizing Provider  amoxicillin (AMOXIL) 500 MG capsule Take 500 mg by mouth as needed (DENTAL APPTS.).  06/20/15   Historical Provider, MD  apixaban (ELIQUIS) 5 MG TABS tablet Take 1 tablet (5 mg total) by mouth 2 (two) times daily. 09/06/15   Jettie Booze, MD  atorvastatin (LIPITOR) 40 MG tablet Take 40 mg by mouth every evening.     Historical Provider, MD  cephALEXin (KEFLEX) 500 MG capsule Take 1 capsule (500 mg total) by mouth 3 (three) times daily. 02/13/16   Nickie Retort, MD  ezetimibe (ZETIA) 10 MG tablet Take 10 mg by mouth every evening.     Historical Provider, MD  HYDROcodone-acetaminophen (NORCO) 5-325 MG tablet Take 1 tablet by mouth every 4 (four) hours as needed for moderate pain. 02/13/16   Nickie Retort, MD  metoprolol tartrate (LOPRESSOR) 25 MG tablet Take 12.5 mg by mouth 2 (two) times daily.     Historical Provider, MD    Allergies:   Review of patient's allergies  indicates no known allergies.   Social History   Social History  . Marital status: Married    Spouse name: N/A  . Number of children: N/A  . Years of education: N/A   Social History Main Topics  . Smoking status: Never Smoker  . Smokeless tobacco: Never Used  . Alcohol use 0.5 - 1.0 oz/week    1 - 2 Standard drinks or equivalent per week  . Drug use: No  . Sexual activity: Not Asked   Other Topics Concern  . None   Social History Narrative   Lives in [place] with wife.  Active.     Family History:  The patient's family history includes Leukemia in his mother.   ROS:   Please see the history  of present illness.    ROS All other systems reviewed and are negative.   PHYSICAL EXAM:   VS:  BP 115/60   Pulse 67   Ht 6' (1.829 m)   Wt 193 lb 6.4 oz (87.7 kg)   BMI 26.23 kg/m    GEN: Well nourished, well developed, in no acute distress  HEENT: normal  Neck: no JVD, carotid bruits, or masses Cardiac: RRR; no murmurs, rubs, or gallops, R lower extremity swelling with tender to palpation Respiratory:  clear to auscultation bilaterally, normal work of breathing GI: soft, nontender, nondistended, + BS MS: no deformity or atrophy  Skin: warm and dry, no rash Neuro:  Alert and Oriented x 3, Strength and sensation are intact Psych: euthymic mood, full affect  Wt Readings from Last 3 Encounters:  03/19/16 193 lb 6.4 oz (87.7 kg)  02/13/16 191 lb 8 oz (86.9 kg)  07/26/15 206 lb 9.6 oz (93.7 kg)      Studies/Labs Reviewed:   EKG:  EKG is ordered today.  The ekg ordered today demonstrates sinus rhythm at rate of 67 bpm, PACs. Prolonged PR interval.   Recent Labs: 02/13/2016: BUN 22; Creatinine, Ser 0.96; Hemoglobin 14.1; Platelets 114; Potassium 4.0; Sodium 141   Lipid Panel No results found for: CHOL, TRIG, HDL, CHOLHDL, VLDL, LDLCALC, LDLDIRECT  Additional studies/ records that were reviewed today include:   Echocardiogram: 04/06/2014 LV EF: 50% -   55%  ------------------------------------------------------------------- Study Conclusions  - Left ventricle: The cavity size was normal. Wall thickness was normal. Systolic function was normal. The estimated ejection fraction was in the range of 50% to 55%. Wall motion was normal; there were no regional wall motion abnormalities. - Left atrium: The atrium was moderately dilated. No evidence of thrombus in the atrial cavity or appendage. No evidence of thrombus in the atrial cavity or appendage. - Right atrium: The atrium was mildly dilated. No evidence of thrombus in the atrial cavity or appendage. No evidence of thrombus in the appendage. - Tricuspid valve: There was mild-moderate regurgitation.  Impressions:  - No intracardiac source was embolism was identified. TEE was followed by a successful cardioversion.    ASSESSMENT & PLAN:    1. Right  Lower extremity edema - 6 days of interruption of anticoagulation in September during surgery and 2 days of interruption about 2 weeks ago for blood clot in urine. Since then gradual worsening of R lower extremity swelling, now has pain. Concerning for DVT. Hx of DVT of same leg in 2001 and 2005. Discussed with DOD (Dr. Lovena Le) get stat  right LE venous doppler . IF positive will increase Eliquis to 10mg  BID. If negative r/o CHF.   2. Paroxysmal atrial flutter - Maintaining sinus rhythm. On Eliquis for anticoagulation.   3. H/o AVR, MVR - No dyspnea. Euvolemic. His symptoms is likely not due to CHF.   4. CAD s/p CABG - No angina. Not on ASA due to need of anticoagulation.   5. Bladder cancer s/p  transurethral resection 01/2016 - Followed by Dr. [name]    Medication Adjustments/Labs and Tests Ordered: Current medicines are reviewed at length with the patient today.  Concerns regarding medicines are outlined above.  Medication changes, Labs and Tests ordered today are listed in the Patient Instructions  below. Patient Instructions  Medication Instructions:  Your physician recommends that you continue on your current medications as directed. Please refer to the Current Medication list given to you today.  Labwork: NONE  Testing/Procedures: Your physician  has requested that you have a lower extremity venous duplex at 4:00 pm at [place] to [facility] to register for test. This test is an ultrasound of the veins in the legs. It looks at venous blood flow that carries blood from the heart to the legs. Allow one hour for a Lower Venous exam.  There are no restrictions or special instructions.   If you need a refill on your cardiac medications before your next appointment, please call your pharmacy.       Jarrett Soho, [place]  03/19/2016 3:57 PM    [facility] [place], [place], [place]  [address] Phone: [phone]; Fax: [phone]

## 2016-03-19 NOTE — Patient Instructions (Addendum)
Medication Instructions:  Your physician recommends that you continue on your current medications as directed. Please refer to the Current Medication list given to you today.  Labwork: NONE  Testing/Procedures: Your physician has requested that you have a lower extremity venous duplex at 4:00 pm at [place] to [facility] to register for test. This test is an ultrasound of the veins in the legs. It looks at venous blood flow that carries blood from the heart to the legs. Allow one hour for a Lower Venous exam.  There are no restrictions or special instructions.   If you need a refill on your cardiac medications before your next appointment, please call your pharmacy.

## 2016-03-19 NOTE — Telephone Encounter (Signed)
The pts wife is concerned because the pts feet and legs have been swelling over the weekend and she is requesting that he be seen today. I have scheduled him to see Melina Copa, PA-c today at 2:30. The pt and his wife are in agreement with time of appt today.

## 2016-03-20 ENCOUNTER — Other Ambulatory Visit: Payer: Self-pay | Admitting: Physician Assistant

## 2016-03-20 ENCOUNTER — Other Ambulatory Visit: Payer: Medicare Other | Admitting: *Deleted

## 2016-03-20 DIAGNOSIS — R609 Edema, unspecified: Secondary | ICD-10-CM

## 2016-03-20 DIAGNOSIS — I059 Rheumatic mitral valve disease, unspecified: Secondary | ICD-10-CM

## 2016-03-20 LAB — BRAIN NATRIURETIC PEPTIDE: BRAIN NATRIURETIC PEPTIDE: 135.8 pg/mL — AB (ref <100)

## 2016-03-20 NOTE — Telephone Encounter (Signed)
Called patient about scheduling an echo and having a BNP lab work today. Patient agreed to coming in today for lab work. Patient put on schedule. Sent scheduling a message for echo. Patient aware that someone will be calling him to schedule his echocardiogram.

## 2016-03-21 ENCOUNTER — Telehealth: Payer: Self-pay | Admitting: *Deleted

## 2016-03-21 DIAGNOSIS — I4892 Unspecified atrial flutter: Secondary | ICD-10-CM

## 2016-03-21 MED ORDER — FUROSEMIDE 20 MG PO TABS: 20.0000 mg | ORAL_TABLET | Freq: Every day | ORAL | 1 refills | Status: DC

## 2016-03-21 MED ORDER — APIXABAN 5 MG PO TABS: 5.0000 mg | ORAL_TABLET | Freq: Two times a day (BID) | ORAL | 0 refills | Status: DC

## 2016-03-21 NOTE — Telephone Encounter (Signed)
-----   Message from [place], [place] sent at 03/21/2016  9:06 AM EDT ----- BNP minimally elevated. Give lasix 20mg  qd for 3 days. BMET Monday or day of echo (if early same week). Elevate legs. If no improvement f/u with PCP to r/o non cardiac etiology.

## 2016-03-21 NOTE — Telephone Encounter (Signed)
Pt has been made aware of his lab results. He will start Lasix 20 mg qd X's 3 days and repeat bmet 03-23-16 at time of his echo. Pt agreeable with this plan and verbalized understanding.

## 2016-03-23 ENCOUNTER — Ambulatory Visit (HOSPITAL_COMMUNITY): Payer: Medicare Other | Attending: Cardiovascular Disease

## 2016-03-23 ENCOUNTER — Other Ambulatory Visit: Payer: Medicare Other | Admitting: *Deleted

## 2016-03-23 ENCOUNTER — Other Ambulatory Visit: Payer: Self-pay

## 2016-03-23 DIAGNOSIS — Z951 Presence of aortocoronary bypass graft: Secondary | ICD-10-CM | POA: Insufficient documentation

## 2016-03-23 DIAGNOSIS — I059 Rheumatic mitral valve disease, unspecified: Secondary | ICD-10-CM | POA: Diagnosis not present

## 2016-03-23 DIAGNOSIS — E785 Hyperlipidemia, unspecified: Secondary | ICD-10-CM | POA: Insufficient documentation

## 2016-03-23 DIAGNOSIS — I071 Rheumatic tricuspid insufficiency: Secondary | ICD-10-CM | POA: Diagnosis not present

## 2016-03-23 DIAGNOSIS — I351 Nonrheumatic aortic (valve) insufficiency: Secondary | ICD-10-CM | POA: Insufficient documentation

## 2016-03-23 DIAGNOSIS — Z86711 Personal history of pulmonary embolism: Secondary | ICD-10-CM | POA: Diagnosis not present

## 2016-03-23 DIAGNOSIS — I4892 Unspecified atrial flutter: Secondary | ICD-10-CM

## 2016-03-23 DIAGNOSIS — Z952 Presence of prosthetic heart valve: Secondary | ICD-10-CM | POA: Diagnosis not present

## 2016-03-23 LAB — BASIC METABOLIC PANEL
BUN: 19 mg/dL (ref 7–25)
CALCIUM: 8.9 mg/dL (ref 8.6–10.3)
CO2: 26 mmol/L (ref 20–31)
Chloride: 105 mmol/L (ref 98–110)
Creat: 1.01 mg/dL (ref 0.70–1.11)
Glucose, Bld: 86 mg/dL (ref 65–99)
Potassium: 4 mmol/L (ref 3.5–5.3)
Sodium: 140 mmol/L (ref 135–146)

## 2016-03-26 NOTE — Progress Notes (Signed)
Normal LV function.  Aortic valve is functioning well.

## 2016-03-29 ENCOUNTER — Telehealth: Payer: Self-pay | Admitting: Interventional Cardiology

## 2016-03-29 NOTE — Telephone Encounter (Signed)
New Message  Pt c/o medication issue:  1. Name of Medication: Lasix  2. How are you currently taking this medication (dosage and times per day)? 20 mg tablet once daily  3. Are you having a reaction (difficulty breathing--STAT)? No  4. What is your medication issue? Bladder cancer survery, next week going to inject fluid, causes pt to urinate frequently.

## 2016-03-29 NOTE — Telephone Encounter (Signed)
Pt recently had operation to remove tumors from bladder.  Pt now has to go every Tuesday for 6 weeks and have a procedure done called a PCG, where they place something in his bladder that he has to be able to keep it in for a hour and a half.  Pt is afraid if he takes the lasix he will be unable to keep the object in his bladder. Wanted to know if it would be ok to hold it and for how long prior to procedure?  Pt was also unsure as to whether or not he should even be taking Lasix anymore based on the test results from his echo and venous doppler.  BLE are still swollen with not much improvement.  Denies leg pain.  Advised I will send message to Dr Irish Lack for review and advisement.

## 2016-03-30 NOTE — Telephone Encounter (Signed)
OK to hold Lasix on the days he goes for this urology treatment.  I would continue to take on the other days.

## 2016-04-02 NOTE — Telephone Encounter (Signed)
Left message to call back  

## 2016-04-02 NOTE — Telephone Encounter (Signed)
Made pt aware of Dr. Thompson Caul recommendations.  Pt verbalized understanding and was appreciative for assistance.

## 2016-07-24 ENCOUNTER — Encounter: Payer: Self-pay | Admitting: Interventional Cardiology

## 2016-07-30 NOTE — Progress Notes (Signed)
Cardiology Office Note   Date:  07/31/2016   ID:  Eric [name], DOB 29-Jul-1929, MRN [id number]  PCP:  Irven Shelling, MD    No chief complaint on file. f/u AVR   Wt Readings from Last 3 Encounters:  07/31/16 198 lb 6.4 oz (90 kg)  03/19/16 193 lb 6.4 oz (87.7 kg)  02/13/16 191 lb 8 oz (86.9 kg)       History of Present Illness: Eric [name] is a 83 y.o. male  who has had an aortic valve replacement and single vessel bypass in 2009.  He had a negative stress test in 2011. No chest pain prior to valve replacement surgery. He did have SHOB.  He exercises a few days a week, without any issues.  In late 2015, his heart rate was noted to be elevated at the time of a tooth extraction. He was found to be in atrial flutter. He underwent successful cardioversion. He notes that during the time he was out of rhythm, he had a cough. This got better after the cardioversion.   He went to [place] in February 2016 for about 3 weeks and did well on the NOAC. Less bleeding and bruising on Eliquis compared to his experience Coumadin.  He then changed from Coumadin to Eliquis to treat multiple DVT and PE.  He had a tooth extraction. When he stayed on his anticoagulation, he had significant bleeding issues at that time. THe next time, he came off of the Eliquis.    In late 2017, he had several instances of blood clots in his urine. He underwent cystoscopy in the urology clinic after holding out was for 4 days. This showed a malignancy. He had surgery and tolerated this well.  He has noticed more edema in his right leg since surgery back in 2017.  It persists.   No bleeding problems.  He has not returned to exercise.  Wife had back surgery and she is returning to walking soon.       Past Medical History:  Diagnosis Date  . Atrial flutter, paroxysmal ([place])    a. dx 04-02-2014, s/p  successful cardioversion 04-06-2014.  . Bladder tumor   . Chronic anticoagulation    on Eliquis--   due to recurrent dvt's and pe's  . Dyslipidemia   . History of basal cell carcinoma excision    06/ 2012  left upper back;   right forehead 07/ 2014  . History of DVT (deep vein thrombosis)    2000-- RLE  . History of melanoma excision    left flank;  07/ 2014 left lower leg and right upper chest  . History of prostate cancer    Gleason 6--  s/p  radical prostatectomy 06/ 2000 in [place]---  no recurrence  . History of pulmonary embolus (PE)    2000 & 2005  bilateral  . Hx of valvuloplasty 06/19/2007   a. s/p mitral ring annuloplasty, repair ruptured chordae of P2 flail segments of MV  . S/P aortic valve replacement with bioprosthetic valve    06-19-2007  severe aortic valve stenosis  . S/P CABG x 1    06-19-2007  SVG to OM1  . Single vessel coronary artery disease 06/19/2007---   cardiologist-  dr Irish Lack   a. 05/2007 CABG x 1: s/p SVG-OM;  b. 10/2010 Ex MV: EF 72%, inf attenuation w/o ischemia, brief run of PAT with exercise. To  . Thrombocytopenia ([place])   . Thyroid goiter 2013   nodular  Past Surgical History:  Procedure Laterality Date  . AORTIC VALVE REPLACEMENT (AVR)/CORONARY ARTERY BYPASS GRAFTING (CABG)  06-19-2007  dr [name]   SVG to OM1 ;   AVR w/ #23 Mitralflow pericardial and ligation left atrial appendage;  MV repair w/ #28 Sorin 3-D memo Ring Annuloplasty with repair ruptured chordar of P-2 flail segments  . CARDIAC CATHETERIZATION  05-12-2007  dr Leonia Reeves   single vessel 30% ostial LAD,  80% LCx;  severe to critial AS,  moderate MR,  normal LVF, ef 70%,  normal right heart pressures and cardiac outputs,  mild elevated LV end-diastolic pressure    . CARDIOVASCULAR STRESS TEST  11-02-2010  dr Irish Lack   normal nuclear study w/ no ischemia or infarct/scar/  normal LV function and wall motion , ef 72%  . CARDIOVERSION N/A 04/06/2014   Procedure: CARDIOVERSION;  Surgeon: [name], MD;  Location: [facility];  Service: Cardiovascular;  Laterality: N/A;    successful  . CATARACT EXTRACTION W/ INTRAOCULAR LENS  IMPLANT, BILATERAL  2012  approx  . RETROPUBIC RADICAL PROSTATECTOMY  06/ 2000  in [place], [place] Left 09/1998  . TEE WITHOUT CARDIOVERSION N/A 04/06/2014   Procedure: TRANSESOPHAGEAL ECHOCARDIOGRAM (TEE);  Surgeon: [name], MD;  Location: [facility] ENDOSCOPY;  Service: Cardiovascular;  Laterality: N/A;  mild focal basa LVH of the septum, ef 50-55%, s/p AV annuloplasty ring, elongated chordae, thickened leaflets, mild to mod. MR, multiple small jets/arotic bioprosthetic valve sits well in position, mild AI/ thickened TV w/ mild reurg./mild LA  . TRANSTHORACIC ECHOCARDIOGRAM  05/04/2013   mild focal basal LVH of septum, ef 60-65%/  bioprosthetic AV w/ mild regurg./  mild thickened MV leaflets with elongated chords w/ mild reurg./  mild to moderate LAE and mild RAE/  mild increase PASP/  mild TR  . TRANSURETHRAL RESECTION OF BLADDER TUMOR N/A 02/13/2016   Procedure: TRANSURETHRAL RESECTION OF BLADDER TUMOR (TURBT);  Surgeon: Nickie Retort, MD;  Location: [facility];  Service: Urology;  Laterality: N/A;     Current Outpatient Prescriptions  Medication Sig Dispense Refill  . amoxicillin (AMOXIL) 500 MG capsule Take 500 mg by mouth as needed (DENTAL APPTS.).   0  . apixaban (ELIQUIS) 5 MG TABS tablet Take 1 tablet (5 mg total) by mouth 2 (two) times daily. 14 tablet 0  . atorvastatin (LIPITOR) 40 MG tablet Take 40 mg by mouth every evening.     . ezetimibe (ZETIA) 10 MG tablet Take 10 mg by mouth every evening.     . furosemide (LASIX) 20 MG tablet Take 1 tablet (20 mg total) by mouth daily. 30 tablet 1  . metoprolol tartrate (LOPRESSOR) 25 MG tablet Take 12.5 mg by mouth 2 (two) times daily.      No current facility-administered medications for this visit.     Allergies:   Patient has no known allergies.    Social History:  The patient  reports that he has never smoked. He has never used  smokeless tobacco. He reports that he drinks about 0.5 - 1.0 oz of alcohol per week . He reports that he does not use drugs.   Family History:  The patient's family history includes Leukemia in his mother.    ROS:  Please see the history of present illness.   Otherwise, review of systems are positive for chronic right leg swelling.   All other systems are reviewed and negative.    PHYSICAL EXAM: VS:  BP 130/62  Pulse (!) 59   Ht 6' (1.829 m)   Wt 198 lb 6.4 oz (90 kg)   SpO2 96%   BMI 26.91 kg/m  , BMI Body mass index is 26.91 kg/m. GEN: Well nourished, well developed, in no acute distress  HEENT: normal  Neck: no JVD, carotid bruits, or masses Cardiac: RRR; soft systolic murmur, no rubs, or gallops,no edema  Respiratory:  clear to auscultation bilaterally, normal work of breathing GI: soft, nontender, nondistended, + BS MS: no deformity or atrophy  Skin: warm and dry, no rash Neuro:  Strength and sensation are intact Psych: euthymic mood, full affect    Recent Labs: 02/13/2016: Hemoglobin 14.1; Platelets 114 03/20/2016: Brain Natriuretic Peptide 135.8 03/23/2016: BUN 19; Creat 1.01; Potassium 4.0; Sodium 140   Lipid Panel No results found for: CHOL, TRIG, HDL, CHOLHDL, VLDL, LDLCALC, LDLDIRECT   Other studies Reviewed: Additional studies/ records that were reviewed today with results demonstrating: NSR with PACs on prior ECG.   ASSESSMENT AND PLAN:  1. S/p AVR/MVR: He uses amoxicillin for SBE prophylaxis.  No CHF sx.   2. Atrial flutter: Eliquis for stroke prevention. Urologic bleeding has stopped. Malignancy treated.  3. LE edema: Elevate legs.  Chronic 4. CAD: No aspirin at this time. No angina. Spoke about increasing exercise and healthy diet.   Current medicines are reviewed at length with the patient today.  The patient concerns regarding his medicines were addressed.  The following changes have been made:  No change  Labs/ tests ordered today include:  No  orders of the defined types were placed in this encounter.   Recommend 150 minutes/week of aerobic exercise Low fat, low carb, high fiber diet recommended  Disposition:   FU in 1 year   Signed, [name], MD  07/31/2016 10:43 AM    [facility] [place], [place], [place]  [address] Phone: [phone]; Fax: [phone]

## 2016-07-31 ENCOUNTER — Encounter: Payer: Self-pay | Admitting: Interventional Cardiology

## 2016-07-31 ENCOUNTER — Encounter (INDEPENDENT_AMBULATORY_CARE_PROVIDER_SITE_OTHER): Payer: Self-pay

## 2016-07-31 ENCOUNTER — Ambulatory Visit (INDEPENDENT_AMBULATORY_CARE_PROVIDER_SITE_OTHER): Payer: Medicare Other | Admitting: Interventional Cardiology

## 2016-07-31 VITALS — BP 130/62 | HR 59 | Ht 72.0 in | Wt 198.4 lb

## 2016-07-31 DIAGNOSIS — I749 Embolism and thrombosis of unspecified artery: Secondary | ICD-10-CM

## 2016-07-31 DIAGNOSIS — I2510 Atherosclerotic heart disease of native coronary artery without angina pectoris: Secondary | ICD-10-CM

## 2016-07-31 DIAGNOSIS — Z952 Presence of prosthetic heart valve: Secondary | ICD-10-CM | POA: Diagnosis not present

## 2016-07-31 DIAGNOSIS — I483 Typical atrial flutter: Secondary | ICD-10-CM

## 2016-07-31 DIAGNOSIS — I059 Rheumatic mitral valve disease, unspecified: Secondary | ICD-10-CM | POA: Diagnosis not present

## 2016-07-31 MED ORDER — FUROSEMIDE 20 MG PO TABS: 20.0000 mg | ORAL_TABLET | Freq: Every day | ORAL | 3 refills | Status: DC

## 2016-07-31 MED ORDER — EZETIMIBE 10 MG PO TABS: 10.0000 mg | ORAL_TABLET | Freq: Every evening | ORAL | 3 refills | Status: DC

## 2016-07-31 MED ORDER — ATORVASTATIN CALCIUM 40 MG PO TABS: 40.0000 mg | ORAL_TABLET | Freq: Every evening | ORAL | 3 refills | Status: DC

## 2016-07-31 MED ORDER — METOPROLOL TARTRATE 25 MG PO TABS: 12.5000 mg | ORAL_TABLET | Freq: Two times a day (BID) | ORAL | 3 refills | Status: DC

## 2016-07-31 MED ORDER — APIXABAN 5 MG PO TABS: 5.0000 mg | ORAL_TABLET | Freq: Two times a day (BID) | ORAL | 3 refills | Status: DC

## 2016-07-31 NOTE — Patient Instructions (Signed)

## 2016-08-02 ENCOUNTER — Telehealth: Payer: Self-pay | Admitting: Interventional Cardiology

## 2016-08-02 NOTE — Telephone Encounter (Signed)
Request for surgical clearance:  1. What type of surgery is being performed? Transurethral Resection of a bladder tumor   2. When is this surgery scheduled?Pending   3. Are there any medications that need to be held prior to surgery and how long?Instructions on how to stop his Plavix and when he stops it   4. Name of physician performing surgery?Dr Baruch Gouty   5. What is your office phone and fax number? [phone] and fax is [phone]

## 2016-08-02 NOTE — Telephone Encounter (Signed)
Pt is on Eliquis not Plavix. Takes Eliquis for hx of multiple DVT and PE. Prefer to minimize time off anticoagulation as much as possible given elevated risk of recurrent VTE. Would recommend holding Eliquis for 24 hours prior and resuming ASAP after procedure. Clearance faxed to Dr Carlynn Purl office.

## 2016-08-02 NOTE — Telephone Encounter (Signed)
I just saw him two days ago and he did not mention this. Will route to Pahrm D regarding stopping anticoagulation.  No further cardiac testing needed before surgery.

## 2016-08-03 ENCOUNTER — Other Ambulatory Visit: Payer: Self-pay | Admitting: Urology

## 2016-08-20 ENCOUNTER — Other Ambulatory Visit: Payer: Self-pay | Admitting: Interventional Cardiology

## 2016-08-20 NOTE — Telephone Encounter (Signed)
Pt last saw Dr Irish Lack on 07/31/16, last labs 03/23/16 Creat 1.01, Age 83, weight 90kg, based on specified criteria pt is on appropriate dosage of Eliquis 5mg  BID.  Will refill rx.

## 2016-08-29 ENCOUNTER — Encounter (HOSPITAL_BASED_OUTPATIENT_CLINIC_OR_DEPARTMENT_OTHER): Payer: Self-pay | Admitting: *Deleted

## 2016-08-29 NOTE — Progress Notes (Addendum)
NPO AFTER MN.  ARRIVE AT 1607.  CURRENT EKG IN CHART AND EPIC.  GETTING LAB WORK DONE Monday, 09-03-2016 (CBC, BMET, PT/INR, PTT).  WILL TAKE METOPROLOL AM DOS W/ SIPS OF WATER.  SPOKE W/ CONI, OR SCHEDULER FOR DR [name],  TO FAX CARDIAC CLEARANCE.  ADDENDUM:   CALLED AND LM VIA PHONE FOR CONI, OR SCHEDULER,  TO FAX CARDIAC CLEARANCE THAT HAVE NOT RECEIVED YET.

## 2016-09-03 DIAGNOSIS — C679 Malignant neoplasm of bladder, unspecified: Secondary | ICD-10-CM | POA: Diagnosis present

## 2016-09-03 DIAGNOSIS — Z8551 Personal history of malignant neoplasm of bladder: Secondary | ICD-10-CM | POA: Diagnosis not present

## 2016-09-03 DIAGNOSIS — Z86718 Personal history of other venous thrombosis and embolism: Secondary | ICD-10-CM | POA: Diagnosis not present

## 2016-09-03 DIAGNOSIS — Z7901 Long term (current) use of anticoagulants: Secondary | ICD-10-CM | POA: Diagnosis not present

## 2016-09-03 DIAGNOSIS — C674 Malignant neoplasm of posterior wall of bladder: Secondary | ICD-10-CM | POA: Diagnosis not present

## 2016-09-03 DIAGNOSIS — I2510 Atherosclerotic heart disease of native coronary artery without angina pectoris: Secondary | ICD-10-CM | POA: Diagnosis not present

## 2016-09-03 DIAGNOSIS — Z79899 Other long term (current) drug therapy: Secondary | ICD-10-CM | POA: Diagnosis not present

## 2016-09-03 DIAGNOSIS — C672 Malignant neoplasm of lateral wall of bladder: Secondary | ICD-10-CM | POA: Diagnosis not present

## 2016-09-03 DIAGNOSIS — Z951 Presence of aortocoronary bypass graft: Secondary | ICD-10-CM | POA: Diagnosis not present

## 2016-09-03 DIAGNOSIS — E7800 Pure hypercholesterolemia, unspecified: Secondary | ICD-10-CM | POA: Diagnosis not present

## 2016-09-03 DIAGNOSIS — Z8546 Personal history of malignant neoplasm of prostate: Secondary | ICD-10-CM | POA: Diagnosis not present

## 2016-09-03 LAB — CBC
HCT: 40.9 % (ref 39.0–52.0)
Hemoglobin: 13.8 g/dL (ref 13.0–17.0)
MCH: 29.8 pg (ref 26.0–34.0)
MCHC: 33.7 g/dL (ref 30.0–36.0)
MCV: 88.3 fL (ref 78.0–100.0)
Platelets: 114 10*3/uL — ABNORMAL LOW (ref 150–400)
RBC: 4.63 MIL/uL (ref 4.22–5.81)
RDW: 12.8 % (ref 11.5–15.5)
WBC: 5 10*3/uL (ref 4.0–10.5)

## 2016-09-03 LAB — BASIC METABOLIC PANEL
ANION GAP: 5 (ref 5–15)
BUN: 20 mg/dL (ref 6–20)
CALCIUM: 9.2 mg/dL (ref 8.9–10.3)
CO2: 28 mmol/L (ref 22–32)
Chloride: 107 mmol/L (ref 101–111)
Creatinine, Ser: 1.04 mg/dL (ref 0.61–1.24)
GFR calc Af Amer: >60 mL/min (ref >60)
GLUCOSE: 109 mg/dL — AB (ref 65–99)
Potassium: 4.1 mmol/L (ref 3.5–5.1)
Sodium: 140 mmol/L (ref 135–145)

## 2016-09-03 LAB — APTT: APTT: 32 s (ref 24–36)

## 2016-09-03 LAB — PROTIME-INR
INR: 1.38
PROTHROMBIN TIME: 17.1 s — AB (ref 11.4–15.2)

## 2016-09-04 ENCOUNTER — Encounter (HOSPITAL_BASED_OUTPATIENT_CLINIC_OR_DEPARTMENT_OTHER): Payer: Self-pay

## 2016-09-04 ENCOUNTER — Ambulatory Visit (HOSPITAL_BASED_OUTPATIENT_CLINIC_OR_DEPARTMENT_OTHER): Payer: Medicare Other | Admitting: Anesthesiology

## 2016-09-04 ENCOUNTER — Encounter (HOSPITAL_BASED_OUTPATIENT_CLINIC_OR_DEPARTMENT_OTHER)
Admission: RE | Disposition: A | Discharge status: Home / Self Care | Payer: Self-pay | Source: Ambulatory Visit | Attending: Urology

## 2016-09-04 ENCOUNTER — Ambulatory Visit (HOSPITAL_BASED_OUTPATIENT_CLINIC_OR_DEPARTMENT_OTHER)
Admission: RE | Admit: 2016-09-04 | Discharge: 2016-09-04 | Disposition: A | Discharge status: Home / Self Care | Payer: Medicare Other | Source: Ambulatory Visit | Attending: Urology | Admitting: Urology

## 2016-09-04 DIAGNOSIS — C672 Malignant neoplasm of lateral wall of bladder: Secondary | ICD-10-CM | POA: Diagnosis not present

## 2016-09-04 DIAGNOSIS — Z951 Presence of aortocoronary bypass graft: Secondary | ICD-10-CM | POA: Insufficient documentation

## 2016-09-04 DIAGNOSIS — Z79899 Other long term (current) drug therapy: Secondary | ICD-10-CM | POA: Insufficient documentation

## 2016-09-04 DIAGNOSIS — Z86718 Personal history of other venous thrombosis and embolism: Secondary | ICD-10-CM | POA: Insufficient documentation

## 2016-09-04 DIAGNOSIS — C674 Malignant neoplasm of posterior wall of bladder: Secondary | ICD-10-CM | POA: Insufficient documentation

## 2016-09-04 DIAGNOSIS — Z8546 Personal history of malignant neoplasm of prostate: Secondary | ICD-10-CM | POA: Insufficient documentation

## 2016-09-04 DIAGNOSIS — C678 Malignant neoplasm of overlapping sites of bladder: Secondary | ICD-10-CM

## 2016-09-04 DIAGNOSIS — I2510 Atherosclerotic heart disease of native coronary artery without angina pectoris: Secondary | ICD-10-CM | POA: Insufficient documentation

## 2016-09-04 DIAGNOSIS — Z7901 Long term (current) use of anticoagulants: Secondary | ICD-10-CM | POA: Insufficient documentation

## 2016-09-04 DIAGNOSIS — Z8551 Personal history of malignant neoplasm of bladder: Secondary | ICD-10-CM | POA: Insufficient documentation

## 2016-09-04 DIAGNOSIS — E7800 Pure hypercholesterolemia, unspecified: Secondary | ICD-10-CM | POA: Insufficient documentation

## 2016-09-04 HISTORY — DX: Urgency of urination: R39.15

## 2016-09-04 HISTORY — DX: Localized edema: R60.0

## 2016-09-04 HISTORY — DX: Presence of dental prosthetic device (complete) (partial): Z97.2

## 2016-09-04 HISTORY — DX: Presence of external hearing-aid: Z97.4

## 2016-09-04 HISTORY — DX: Frequency of micturition: R35.0

## 2016-09-04 HISTORY — PX: TRANSURETHRAL RESECTION OF BLADDER TUMOR: SHX2575

## 2016-09-04 HISTORY — DX: Personal history of malignant neoplasm of bladder: Z85.51

## 2016-09-04 HISTORY — DX: Nocturia: R35.1

## 2016-09-04 SURGERY — TURBT (TRANSURETHRAL RESECTION OF BLADDER TUMOR)
Anesthesia: General

## 2016-09-04 MED ORDER — PROMETHAZINE HCL 25 MG/ML IJ SOLN
6.2500 mg | INTRAMUSCULAR | Status: DC | PRN
  Filled 2016-09-04: qty 1

## 2016-09-04 MED ORDER — FENTANYL CITRATE (PF) 100 MCG/2ML IJ SOLN
25.0000 ug | INTRAMUSCULAR | Status: DC | PRN
  Filled 2016-09-04: qty 1

## 2016-09-04 MED ORDER — FENTANYL CITRATE (PF) 100 MCG/2ML IJ SOLN
INTRAMUSCULAR | Status: AC
  Filled 2016-09-04: qty 2

## 2016-09-04 MED ORDER — ONDANSETRON HCL 4 MG/2ML IJ SOLN
INTRAMUSCULAR | Status: DC | PRN
  Administered 2016-09-04: 4 mg via INTRAVENOUS

## 2016-09-04 MED ORDER — DEXAMETHASONE SODIUM PHOSPHATE 10 MG/ML IJ SOLN
INTRAMUSCULAR | Status: AC
  Filled 2016-09-04: qty 1

## 2016-09-04 MED ORDER — LIDOCAINE 2% (20 MG/ML) 5 ML SYRINGE
INTRAMUSCULAR | Status: AC
  Filled 2016-09-04: qty 10

## 2016-09-04 MED ORDER — PROPOFOL 500 MG/50ML IV EMUL
INTRAVENOUS | Status: DC | PRN
  Administered 2016-09-04: 150 mL via INTRAVENOUS

## 2016-09-04 MED ORDER — CEPHALEXIN 500 MG PO CAPS: 500.0000 mg | ORAL_CAPSULE | Freq: Three times a day (TID) | ORAL | 0 refills | Status: DC

## 2016-09-04 MED ORDER — SODIUM CHLORIDE 0.9 % IR SOLN
Status: DC | PRN
  Administered 2016-09-04: 6000 mL via INTRAVESICAL
  Administered 2016-09-04: 3000 mL via INTRAVESICAL

## 2016-09-04 MED ORDER — ONDANSETRON HCL 4 MG/2ML IJ SOLN
INTRAMUSCULAR | Status: AC
  Filled 2016-09-04: qty 2

## 2016-09-04 MED ORDER — CEFAZOLIN SODIUM-DEXTROSE 2-4 GM/100ML-% IV SOLN
2.0000 g | INTRAVENOUS | Status: AC
  Administered 2016-09-04: 2 g via INTRAVENOUS
  Filled 2016-09-04: qty 100

## 2016-09-04 MED ORDER — FENTANYL CITRATE (PF) 100 MCG/2ML IJ SOLN
INTRAMUSCULAR | Status: DC | PRN
  Administered 2016-09-04: 50 ug via INTRAVENOUS

## 2016-09-04 MED ORDER — DEXAMETHASONE SODIUM PHOSPHATE 10 MG/ML IJ SOLN
INTRAMUSCULAR | Status: DC | PRN
  Administered 2016-09-04: 10 mg via INTRAVENOUS

## 2016-09-04 MED ORDER — OXYCODONE HCL 5 MG/5ML PO SOLN
5.0000 mg | Freq: Once | ORAL | Status: DC | PRN
  Filled 2016-09-04: qty 5

## 2016-09-04 MED ORDER — HYDROCODONE-ACETAMINOPHEN 5-325 MG PO TABS: 1.0000 | ORAL_TABLET | ORAL | 0 refills | Status: DC | PRN

## 2016-09-04 MED ORDER — OXYCODONE HCL 5 MG PO TABS
5.0000 mg | ORAL_TABLET | Freq: Once | ORAL | Status: DC | PRN
  Filled 2016-09-04: qty 1

## 2016-09-04 MED ORDER — LACTATED RINGERS IV SOLN
INTRAVENOUS | Status: DC
  Administered 2016-09-04: 11:00:00 via INTRAVENOUS
  Filled 2016-09-04: qty 1000

## 2016-09-04 MED ORDER — LIDOCAINE 2% (20 MG/ML) 5 ML SYRINGE
INTRAMUSCULAR | Status: DC | PRN
  Administered 2016-09-04: 80 mg via INTRAVENOUS

## 2016-09-04 MED ORDER — CEFAZOLIN SODIUM-DEXTROSE 2-4 GM/100ML-% IV SOLN
INTRAVENOUS | Status: AC
  Filled 2016-09-04: qty 100

## 2016-09-04 SURGICAL SUPPLY — 28 items
BAG DRAIN URO-CYSTO SKYTR STRL (DRAIN) ×1 IMPLANT
BAG DRN UROCATH (DRAIN)
BAG URINE DRAINAGE (UROLOGICAL SUPPLIES) IMPLANT
BAG URINE LEG 500ML (DRAIN) IMPLANT
CATH FOLEY 2WAY SLVR  5CC 22FR (CATHETERS)
CATH FOLEY 2WAY SLVR 30CC 20FR (CATHETERS) IMPLANT
CATH FOLEY 2WAY SLVR 5CC 22FR (CATHETERS) IMPLANT
CATH INTERMIT  6FR 70CM (CATHETERS) ×1 IMPLANT
CLOTH BEACON ORANGE TIMEOUT ST (SAFETY) ×2 IMPLANT
ELECT BIVAP BIPO 22/24 DONUT (ELECTROSURGICAL)
ELECT REM PT RETURN 9FT ADLT (ELECTROSURGICAL) ×2
ELECTRD BIVAP BIPO 22/24 DONUT (ELECTROSURGICAL) IMPLANT
ELECTRODE REM PT RTRN 9FT ADLT (ELECTROSURGICAL) ×1 IMPLANT
EVACUATOR MICROVAS BLADDER (UROLOGICAL SUPPLIES) IMPLANT
GLOVE BIO SURGEON STRL SZ7.5 (GLOVE) ×2 IMPLANT
GOWN STRL REUS W/ TWL LRG LVL3 (GOWN DISPOSABLE) ×1 IMPLANT
GOWN STRL REUS W/ TWL XL LVL3 (GOWN DISPOSABLE) ×1 IMPLANT
GOWN STRL REUS W/TWL LRG LVL3 (GOWN DISPOSABLE) ×2
GOWN STRL REUS W/TWL XL LVL3 (GOWN DISPOSABLE) ×3 IMPLANT
GUIDEWIRE STR DUAL SENSOR (WIRE) ×2 IMPLANT
IV NS IRRIG 3000ML ARTHROMATIC (IV SOLUTION) ×2 IMPLANT
KIT RM TURNOVER CYSTO AR (KITS) ×2 IMPLANT
LOOP CUT BIPOLAR 24F LRG (ELECTROSURGICAL) ×1 IMPLANT
MANIFOLD NEPTUNE II (INSTRUMENTS) ×2 IMPLANT
PACK CYSTO (CUSTOM PROCEDURE TRAY) ×2 IMPLANT
SET ASPIRATION TUBING (TUBING) IMPLANT
SYRINGE IRR TOOMEY STRL 70CC (SYRINGE) IMPLANT
TUBE CONNECTING 12X1/4 (SUCTIONS) ×1 IMPLANT

## 2016-09-04 NOTE — Anesthesia Preprocedure Evaluation (Addendum)
Anesthesia Evaluation  Patient identified by MRN, date of birth, ID band Patient awake    Reviewed: Allergy & Precautions, NPO status , Patient's Chart, lab work & pertinent test results  Airway Mallampati: II  TM Distance: >3 FB Neck ROM: Full    Dental no notable dental hx. 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    Neuro/Psych negative neurological ROS  negative psych ROS   GI/Hepatic negative GI ROS, Neg liver ROS,   Endo/Other  negative endocrine ROS  Renal/GU negative Renal ROS  negative genitourinary   Musculoskeletal negative musculoskeletal ROS (+)   Abdominal   Peds negative pediatric ROS (+)  Hematology negative hematology ROS (+)   Anesthesia Other Findings   Reproductive/Obstetrics negative OB ROS                         Anesthesia Physical Anesthesia Plan  ASA: III  Anesthesia Plan: General   Post-op Pain Management:    Induction: Intravenous  Airway Management Planned: LMA  Additional Equipment:   Intra-op Plan:   Post-operative Plan: Extubation in OR  Informed Consent: I have reviewed the patients History and Physical, chart, labs and discussed the procedure including the risks, benefits and alternatives for the proposed anesthesia with the patient or authorized representative who has indicated his/her understanding and acceptance.   Dental advisory given  Plan Discussed with: CRNA and Surgeon  Anesthesia Plan Comments:         Anesthesia Quick Evaluation

## 2016-09-04 NOTE — Anesthesia Procedure Notes (Signed)
Procedure Name: LMA Insertion Date/Time: 09/04/2016 12:22 PM Performed by: Wanita Chamberlain Pre-anesthesia Checklist: Patient identified, Timeout performed, Emergency Drugs available, Suction available and Patient being monitored Patient Re-evaluated:Patient Re-evaluated prior to inductionOxygen Delivery Method: Circle system utilized Preoxygenation: Pre-oxygenation with 100% oxygen Intubation Type: IV induction Ventilation: Mask ventilation without difficulty LMA: LMA inserted LMA Size: 5.0 Number of attempts: 1 Placement Confirmation: positive ETCO2 and breath sounds checked- equal and bilateral Tube secured with: Tape Dental Injury: Teeth and Oropharynx as per pre-operative assessment

## 2016-09-04 NOTE — Transfer of Care (Signed)
Immediate Anesthesia Transfer of Care Note  Patient: Eric [name]  Procedure(s) Performed: Procedure(s): TRANSURETHRAL RESECTION OF BLADDER TUMOR (TURBT) (N/A)  Patient Location: PACU  Anesthesia Type:General  Level of Consciousness: awake, alert , oriented and patient cooperative  Airway & Oxygen Therapy: Patient Spontanous Breathing and Patient connected to nasal cannula oxygen  Post-op Assessment: Report given to RN and Post -op Vital signs reviewed and stable  Post vital signs: Reviewed and stable  Last Vitals:  Vitals:   09/04/16 1000 09/04/16 1306  BP: (!) 154/53 (!) 148/79  Pulse: (!) 54 60  Resp: 17 11  Temp: 36.4 C 36.5 C    Last Pain:  Vitals:   09/04/16 1000  TempSrc: Oral      Patients Stated Pain Goal: 7 (09/81/19 1478)  Complications: No apparent anesthesia complications

## 2016-09-04 NOTE — Discharge Instructions (Signed)

## 2016-09-04 NOTE — H&P (Signed)
CC: I have bladder cancer.  HPI: Eric [name] is a 83 year-old male established patient who is here for bladder cancer.    The patient underwent a TURBT in September 2017 for multifocal bladder tumors. 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 No invasion was identified. Detrusor muscle was in the specimen and uninvolved. Most of the disease was low grade however 20-30% of the had high-grade features overall it was a high-grade tumor.   He completed induction BCG in December 2017.     CC/HPI: I have prostate cancer.     Radical retropubic prostatecetomy in 2000. PSA currently undetectable.     ALLERGIES: None   MEDICATIONS: Metoprolol Succinate 25 mg tablet, extended release 24 hr  Atorvastatin Calcium 40 mg tablet  Doxycycline Hyclate 100 mg tablet 1 tablet PO BID  Eliquis 5 mg tablet     GU PSH: Bladder Instill AntiCA Agent - 05/09/2016, 05/01/2016, 04/24/2016, 04/17/2016, 04/10/2016, 04/03/2016 Cystoscopy - 03/05/2016, 01/24/2016 Cystoscopy TURBT >5 cm - 02/13/2016 Locm 300-399Mg /Ml Iodine,1Ml - 01/17/2016 Radical Prostatectomy, In [place] - 2000, in [place], per patient last psa was 0.0 - 2000    NON-GU PSH: Heart Surgery (Unspecified), open procedure - 2009    GU PMH: Bladder Cancer, overlapping sites (Stable) - 03/05/2016, - 02/20/2016 Gross hematuria - 03/05/2016, - 02/20/2016, - 01/24/2016, - 01/09/2016 Dysuria (Worsening), Rapaflo 8 mg 1 po daily X 14 days. - 02/23/2016 Bladder, Neoplasm of uncertain behavior - 01/26/9891 Kidney Stone - 01/09/2016 Prostate Cancer - 01/09/2016    NON-GU PMH: Bacteriuria, Culture urine. Will empirically begin SMZ-TMP DS 1 po BID X 5 days till culture complete - 02/23/2016 Cardiac murmur, unspecified Hypercholesterolemia    FAMILY HISTORY: father deceased at age 12 from cancer - Father leukemia - Father mother deceased at age 38 - Mother   SOCIAL HISTORY: Marital Status: Married Current Smoking Status: Patient has never smoked.   Drinks 2 drinks per week.  Drinks 2 caffeinated drinks per day. Patient's occupation is/was retired.    REVIEW OF SYSTEMS:    GU Review Male:   Patient reports get up at night to urinate, frequent urination, burning/ pain with urination, and hard to postpone urination. Patient denies erection problems, trouble starting your stream, stream starts and stops, have to strain to urinate , penile pain, and leakage of urine.  Gastrointestinal (Upper):   Patient denies nausea, vomiting, and indigestion/ heartburn.  Gastrointestinal (Lower):   Patient denies diarrhea and constipation.  Constitutional:   Patient reports fatigue. Patient denies fever, night sweats, and weight loss.  Skin:   Patient denies skin rash/ lesion and itching.  Eyes:   Patient denies blurred vision and double vision.  Ears/ Nose/ Throat:   Patient denies sore throat and sinus problems.  Hematologic/Lymphatic:   Patient reports easy bruising. Patient denies swollen glands.  Cardiovascular:   Patient denies leg swelling and chest pains.  Respiratory:   Patient denies cough and shortness of breath.  Endocrine:   Patient denies excessive thirst.  Musculoskeletal:   Patient denies back pain and joint pain.  Neurological:   Patient denies headaches and dizziness.  Psychologic:   Patient denies depression and anxiety.   VITAL SIGNS:      08/01/2016 09:57 AM  BP 159/64 mmHg  Pulse 58 /min  Temperature 98.2 F / 37 C   MULTI-SYSTEM PHYSICAL EXAMINATION:    Constitutional: Well-nourished. No physical deformities. Normally developed. Good grooming.  Neck: Neck symmetrical, not swollen. Normal tracheal position.  Respiratory: No labored breathing,  no use of accessory muscles.   Cardiovascular: Normal temperature, normal extremity pulses, no swelling, no varicosities.  Skin: No paleness, no jaundice, no cyanosis. No lesion, no ulcer, no rash.  Gastrointestinal: No mass, no tenderness, no rigidity, non obese abdomen.  Eyes: Normal  conjunctivae. Normal eyelids.  Ears, Nose, Mouth, and Throat: Left ear no scars, no lesions, no masses. Right ear no scars, no lesions, no masses. Nose no scars, no lesions, no masses. Normal hearing. Normal lips.  Musculoskeletal: Normal gait and station of head and neck.     PAST DATA REVIEWED:  Source Of History:  Patient   PROCEDURES:         Flexible Cystoscopy - 52000  Risks, benefits, and some of the potential complications of the procedure were discussed at length with the patient including infection, bleeding, voiding discomfort, urinary retention, fever, chills, sepsis, and others. All questions were answered. Informed consent was obtained. Antibiotic prophylaxis was given. Sterile technique and intraurethral analgesia were used.  Meatus:  Normal size. Normal location. Normal condition.  Urethra:  No strictures.  External Sphincter:  Normal.  Verumontanum:  Verumontanum Surgically Absent.  Prostate:  Prostate Surgically Absent.  Bladder Neck:  Non-obstructing.  Ureteral Orifices:  Normal location. Normal size. Normal shape. Effluxed clear urine.  Bladder:  Right posterior wall 3-4 cm area of bullous edema vs recurrent tumor      The lower urinary tract was carefully examined. The procedure was well-tolerated and without complications. Antibiotic instructions were given. Instructions were given to call the office immediately for bloody urine, difficulty urinating, urinary retention, painful or frequent urination, fever, chills, nausea, vomiting or other illness. The patient stated that he understood these instructions and would comply with them.         Urinalysis w/Scope Dipstick Dipstick Cont'd Micro  Color: Yellow Bilirubin: Neg WBC/hpf: NS (Not Seen)  Appearance: Clear Ketones: Neg RBC/hpf: 3 - 10/hpf  Specific Gravity: 1.010 Blood: 3+ Bacteria: Rare (0-9/hpf)  pH: 6.0 Protein: Neg Cystals: NS (Not Seen)  Glucose: Neg Urobilinogen: 0.2 Casts: NS (Not Seen)    Nitrites: Neg  Trichomonas: Not Present    Leukocyte Esterase: Neg Mucous: Not Present      Epithelial Cells: 0 - 5/hpf      Yeast: NS (Not Seen)      Sperm: Not Present    ASSESSMENT:      ICD-10 Details  1 GU:   Bladder Cancer, overlapping sites - C67.8   2   Prostate Cancer - C61    PLAN:           Orders Labs Urine Culture and Sensitivity          Schedule Procedure: Unspecified Date - Cystoscopy TURBT 2-5 cm - 73419          Document Letter(s):  Created for Delanna Ahmadi, MD   Created for Patient: Clinical Summary    The patient and I talked at length about treatment options of bladder cancer. The relationship between smoking and bladder cancer was discussed. Etiologies of bladder cancer were discussed with the patient.   Treatment options were discussed including the initial resection of the tumor(s), radical cystectomy both open and roboitc assisted, along with chemotherapy, immunotherapy, and radiation therapy. Pathological grades of bladder cancer, stages of bladder cancer including superficial and muscle invasive disease were discussed in detail.   The risks, benefits, and some of the possible complications of transurethral resection of the bladder tumor were discussed with the patient including  the anticipated cure rate, the possible need for additional surgical procedures, adjuvant chemotherapy, radiation therapy or immunotherapy. The possibility that this operative procedure might not to cure the underlying disease, that micrometastatic disease might already be present, and that this underlying disease might result in the death of the patient was discussed.  The general risks of the operative procedure and the perioperative period were discussed with the patient at length and in detail including swelling, pain, nausea, vomiting, fever, chills, infection, wound infection, sepsis, renal failure, internal or external bleeding, intraoperative bowel, organ or vascular injuries,  postoperative formation of scar tissue, the need for blood transfusions, deep venous thrombosis or blood clots, pulmonary embolus, pneumonia, respiratory failure, heart attack, stroke, death and others.   Aternative treatment options were discussed with the patient in detail. All of the patient's questions were answered and he voiced an understanding of these risks, benefits and possible complications.   The patient gave fully informed consent to proceed with a transurethral resection of their bladder tumor for the treatment of thier bladder cancer.        Notes:   1. Multifocal high grade pTa TCC  -repeat TURBT due to lesion seen on cystoscopy   2. History of Prostate Cancer  -due for PSA in Summer 2018   3. Right lower pole stone  -no intervention planned at this time

## 2016-09-04 NOTE — Op Note (Signed)
Date of procedure: 09/04/16  Preoperative diagnosis:  1. Bladder cancer   Postoperative diagnosis:  1. Bladder cancer   Procedure: 1. Transurethral resection of bladder tumor-5.5 cm and 2 cm  Surgeon: Baruch Gouty, MD  Anesthesia: General  Complications: None  Intraoperative findings: The patient had recurrence of his bladder tumor that appeared superficial. 1 areas approximately 2 cm on the left posterior wall. The other was approximately 5-1/2 cm in the right upper lateral wall. The major portion of the smaller tumor as well as the larger portion were resected down to muscle. The remaining tumor was very superficial appearing and exophytic cervical was coagulated due to the patient's thin bladder wall.  EBL: None  Specimens: Bladder tumor to pathology  Drains: None  Disposition: Stable to the postanesthesia care unit  Indication for procedure: The patient is a 83 y.o. male with history of high-grade pTa bladder cancer status post induction BCG with recurrence who presents today for definitive management.  After reviewing the management options for treatment, the patient elected to proceed with the above surgical procedure(s). We have discussed the potential benefits and risks of the procedure, side effects of the proposed treatment, the likelihood of the patient achieving the goals of the procedure, and any potential problems that might occur during the procedure or recuperation. Informed consent has been obtained.  Description of procedure: The patient was met in the preoperative area. All risks, benefits, and indications of the procedure were described in great detail. The patient consented to the procedure. Preoperative antibiotics were given. The patient was taken to the operative theater. General anesthesia was induced per the anesthesia service. The patient was then placed in the dorsal lithotomy position and prepped and draped in the usual sterile fashion. A preoperative  timeout was called.   A 24 French resectoscope sheath with visual obturator was inserted into the patient's bladder per urethra atraumatically. He recurrence of superficial appearing bladder tumor on the right upper lateral wall that is partially 5 cm. The bulk of this was resected down to muscle. The remaining areas around the tumor which appeared to be low-grade superficial lesions were fulgurated until there is no longer visible. The patient also had a 2 cm bladder tumor in the left lower posterior lateral wall. This was resected. All areas were then fulgurated around the tumor. Hemostasis was obtained and was excellent. Bladder chips were evacuated and sent to pathology. The patient was woke from anesthesia and transferred in stable condition to the postanesthesia care unit.  Plan: The patient will follow-up in one week for biopsy results. He will likely need repeat induction BCG. We'll also keep an ice PSA which was undetectable in January 2018 due to his distant history of prostate cancer status post prostatectomy.  Baruch Gouty, M.D.

## 2016-09-04 NOTE — Anesthesia Postprocedure Evaluation (Signed)
Anesthesia Post Note  Patient: Eric [name]  Procedure(s) Performed: Procedure(s) (LRB): TRANSURETHRAL RESECTION OF BLADDER TUMOR (TURBT) (N/A)  Patient location during evaluation: PACU Anesthesia Type: General Level of consciousness: awake and alert and patient cooperative Pain management: pain level controlled Vital Signs Assessment: post-procedure vital signs reviewed and stable Respiratory status: spontaneous breathing and respiratory function stable Cardiovascular status: stable Anesthetic complications: no       Last Vitals:  Vitals:   09/04/16 1315 09/04/16 1330  BP: 138/64 135/61  Pulse: (!) 51 (!) 52  Resp: 12 14  Temp:      Last Pain:  Vitals:   09/04/16 1000  TempSrc: Oral                 Janathan Bribiesca S

## 2016-09-05 ENCOUNTER — Encounter (HOSPITAL_BASED_OUTPATIENT_CLINIC_OR_DEPARTMENT_OTHER): Payer: Self-pay | Admitting: Urology

## 2016-10-16 ENCOUNTER — Telehealth: Payer: Self-pay | Admitting: Interventional Cardiology

## 2016-10-16 NOTE — Telephone Encounter (Signed)
New message     Pt wants to know if he can take Allegra with Eliquis

## 2016-10-16 NOTE — Telephone Encounter (Signed)
Follow up  ° ° ° °Pt is calling to follow up on this.  °

## 2016-10-16 NOTE — Telephone Encounter (Signed)
Advised patient that it is okay for him to take Allegra with Eliquis. Explained he could not take Zyrtec but Allegra is considered safe to do so. He thanked me for my call.

## 2016-10-22 ENCOUNTER — Telehealth: Payer: Self-pay | Admitting: Interventional Cardiology

## 2016-10-22 NOTE — Telephone Encounter (Signed)
Called patient and informed him of the pharmacist message. Patient verbalized understanding and had no other questions at this time.

## 2016-10-22 NOTE — Telephone Encounter (Signed)
BCG with interferon alpha-2b is safe to take with pt's cardiac medications, including Eliquis.

## 2016-10-22 NOTE — Telephone Encounter (Signed)
Patient is having bladder surgery and afterwards the physician at [facility] will be giving the patient 6 BCG washes with an added ingredient Interferon. Patient stated that he has had BCG washes before, but this is the first time with interferon. Patient wants to make sure that interferon will be fine to take with his other medications, including his eliquis. Will route to Dr. Irish Lack and the pharmacy for advisement.

## 2016-10-22 NOTE — Telephone Encounter (Signed)
New message      Pt has bladder cancer.  He is also on eliquis.  Calling to let the nurse know what [facility] want to do regarding treating cancer but pt is concerned since he is on eliquis.  Please call

## 2017-07-10 LAB — COMPREHENSIVE METABOLIC PANEL WITH GFR: EGFR: 60

## 2017-07-30 NOTE — Progress Notes (Signed)
Cardiology Office Note   Date:  08/02/2017   ID:  Eric [name], DOB 1929-10-13, MRN [id number]  PCP:  Eric Orn, MD    No chief complaint on file.  AVR  Wt Readings from Last 3 Encounters:  08/02/17 201 lb (91.2 kg)  09/04/16 195 lb 8 oz (88.7 kg)  07/31/16 198 lb 6.4 oz (90 kg)       History of Present Illness: Eric [name] is a 84 y.o. male  who has had an aortic valve replacement and single vessel bypass in 2009.  He had a negative stress test in 2011. No chest pain prior to valve replacement surgery. He did have SHOB.   In late 2015, his heart rate was noted to be elevated at the time of a tooth extraction. He was found to be in atrial flutter. He underwent successful cardioversion. He notes that during the time he was out of rhythm, he had a cough. This got better after the cardioversion.   He went to [place] in February 2016 for about 3 weeks and did well on the NOAC. Less bleeding and bruising on Eliquis compared to his experience Coumadin. He then changed from Coumadin to Eliquis to treat multiple DVT and PE.  He had a tooth extraction. When he stayed on his anticoagulation, he had significant bleeding issues at that time. THe next time, he came off of the Eliquis.   In late 2017, he had several instances of blood clots in his urine. He underwent cystoscopy in the urology clinic after holding out was for 4 days. This showed a malignancy. He had surgery and tolerated this well.  He has noticed more edema in his right leg since surgery back in 2017.  It persists.   He has bladder cancer and recently a UTI.  He will be trying something for bladder spasms.   He also has an external hemorrhoid.  He may need this banded.  He saw the surgeon yesterday.    Denies : Chest pain. Dizziness.  Nitroglycerin use. Orthopnea. Palpitations. Paroxysmal nocturnal dyspnea. Shortness of breath. Syncope.   Past Medical History:  Diagnosis Date  . Atrial flutter, paroxysmal  ([place])    a. dx 04-02-2014, s/p  successful cardioversion 04-06-2014.  . Bilateral lower extremity edema    CHRONIC  . Bladder tumor   . Chronic anticoagulation    on Eliquis--  due to recurrent dvt's and pe's  . Dyslipidemia   . Frequency of urination   . History of basal cell carcinoma excision    06/ 2012  left upper back;   right forehead 07/ 2014  . History of bladder cancer urologist-  dr Eric [name]   02-13-2016  s/p TURBT per path high grade papillary urothelial carcinoma  . History of DVT (deep vein thrombosis)    2000-- RLE  . History of melanoma excision    left flank;  07/ 2014 left lower leg and right upper chest  . History of prostate cancer    Gleason 6--  s/p  radical prostatectomy 06/ 2000 in [place]---  no recurrence  . History of pulmonary embolus (PE)    2000 & 2005  bilateral  . Hx of valvuloplasty 06/19/2007   a. s/p mitral ring annuloplasty, repair ruptured chordae of P2 flail segments of MV  . Nocturia more than twice per night   . S/P aortic valve replacement with bioprosthetic valve    06-19-2007  severe aortic valve stenosis  . S/P CABG  x 1    06-19-2007  SVG to OM1  . Single vessel coronary artery disease   cardiologist-  dr Irish Lack   a. 05/2007 CABG x 1: s/p SVG-OM;  b. 10/2010 Ex MV: EF 72%, inf attenuation w/o ischemia, brief run of PAT with exercise. To  . Thrombocytopenia ([place])   . Thyroid goiter 2013   nodular  . Urgency of urination   . Wears hearing aid    BILATERAL  . Wears partial dentures    UPPER    Past Surgical History:  Procedure Laterality Date  . AORTIC VALVE REPLACEMENT (AVR)/CORONARY ARTERY BYPASS GRAFTING (CABG)  06-19-2007  dr Eric [name]   SVG to OM1 ;   AVR w/ #23 Mitralflow pericardial and ligation left atrial appendage;  MV repair w/ #28 Sorin 3-D memo Ring Annuloplasty with repair ruptured chordar of P-2 flail segments  . CARDIAC CATHETERIZATION  05-12-2007  dr Leonia Reeves   single vessel 30% ostial LAD,  80% LCx;  severe to  critial AS,  moderate MR,  normal LVF, ef 70%,  normal right heart pressures and cardiac outputs,  mild elevated LV end-diastolic pressure    . CARDIOVASCULAR STRESS TEST  11-02-2010  dr Irish Lack   normal nuclear study w/ no ischemia or infarct/scar/  normal LV function and wall motion , ef 72%  . CARDIOVERSION N/A 04/06/2014   Procedure: CARDIOVERSION;  Surgeon: Eric Spark, MD;  Location: [facility];  Service: Cardiovascular;  Laterality: N/A;   successful  . CATARACT EXTRACTION W/ INTRAOCULAR LENS  IMPLANT, BILATERAL  2012  approx  . RETROPUBIC RADICAL PROSTATECTOMY  06/ 2000  in [place], [place] Left 09/1998  . TEE WITHOUT CARDIOVERSION N/A 04/06/2014   Procedure: TRANSESOPHAGEAL ECHOCARDIOGRAM (TEE);  Surgeon: Eric Spark, MD;  Location: [facility] ENDOSCOPY;  Service: Cardiovascular;  Laterality: N/A;  mild focal basa LVH of the septum, ef 50-55%, s/p AV annuloplasty ring, elongated chordae, thickened leaflets, mild to mod. MR, multiple small jets/arotic bioprosthetic valve sits well in position, mild AI/ thickened TV w/ mild reurg./mild LA  . TRANSTHORACIC ECHOCARDIOGRAM  03-23-2016   dr Irish Lack   EF 55-60%, reduced contribution atrial contraction to ventricular filling, due to increased ventricular diastolic pressure or atrial contratile dysfunction/  bioprosthetic AV w/ mod. regurg. (valve area 1.97cm^2)/ mild dilated ascending aorta/ mild thicken MV normal function annular ring prosthesis/severe LAE & mod. to sev.RAE/ PASP 56mmHg/ mild TR/ ventricle septal motion show paradox  . TRANSURETHRAL RESECTION OF BLADDER TUMOR N/A 02/13/2016   Procedure: TRANSURETHRAL RESECTION OF BLADDER TUMOR (TURBT);  Surgeon: Eric Retort, MD;  Location: [facility];  Service: Urology;  Laterality: N/A;  . TRANSURETHRAL RESECTION OF BLADDER TUMOR N/A 09/04/2016   Procedure: TRANSURETHRAL RESECTION OF BLADDER TUMOR (TURBT);  Surgeon: Eric Retort, MD;  Location:  [facility];  Service: Urology;  Laterality: N/A;     Current Outpatient Medications  Medication Sig Dispense Refill  . apixaban (ELIQUIS) 5 MG TABS tablet Take 5 mg by mouth 2 (two) times daily.    Marland Kitchen atorvastatin (LIPITOR) 40 MG tablet Take 1 tablet (40 mg total) by mouth every evening. 90 tablet 3  . ezetimibe (ZETIA) 10 MG tablet Take 1 tablet (10 mg total) by mouth every evening. 90 tablet 3  . furosemide (LASIX) 20 MG tablet Take 20 mg by mouth daily as needed (swelling).    Marland Kitchen HYDROcodone-acetaminophen (NORCO) 5-325 MG tablet Take 1 tablet by mouth every 4 (four) hours  as needed for moderate pain. 30 tablet 0  . metoprolol tartrate (LOPRESSOR) 25 MG tablet Take 0.5 tablets (12.5 mg total) by mouth 2 (two) times daily. 90 tablet 3   No current facility-administered medications for this visit.     Allergies:   Patient has no known allergies.    Social History:  The patient  reports that  has never smoked. he has never used smokeless tobacco. He reports that he drinks about 0.5 - 1.0 oz of alcohol per week. He reports that he does not use drugs.   Family History:  The patient's family history includes Leukemia in his mother.    ROS:  Please see the history of present illness.   Otherwise, review of systems are positive for frequent urination.   All other systems are reviewed and negative.    PHYSICAL EXAM: VS:  BP (!) 130/50   Pulse 65   Ht 6' (1.829 m)   Wt 201 lb (91.2 kg)   BMI 27.26 kg/m  , BMI Body mass index is 27.26 kg/m. GEN: Well nourished, well developed, in no acute distress  HEENT: normal  Neck: no JVD, carotid bruits, or masses Cardiac: RRR; 1/6 systolic murmur, no rubs, or gallops,no edema  Respiratory:  clear to auscultation bilaterally, normal work of breathing GI: soft, nontender, nondistended, + BS MS: no deformity or atrophy  Skin: warm and dry, no rash Neuro:  Strength and sensation are intact Psych: euthymic mood, full affect   EKG:    The ekg ordered today demonstrates NSR, sinus arrhythmia   Recent Labs: 09/03/2016: BUN 20; Creatinine, Ser 1.04; Hemoglobin 13.8; Platelets 114; Potassium 4.1; Sodium 140   Lipid Panel No results found for: CHOL, TRIG, HDL, CHOLHDL, VLDL, LDLCALC, LDLDIRECT   Other studies Reviewed: Additional studies/ records that were reviewed today with results demonstrating: Lab work reviewed as noted below.   ASSESSMENT AND PLAN:  1. S/p MVR/AVR: Continue SBE prophylaxis.  No signs of heart failure. 2. Atrial flutter: Noted in 2015.  He had cardioversion at that time.  He is on anticoagulation for chronic DVT and PE. 3. LE edema: Elevate legs.  He can also try compression stockings that go up to the knee.  He can use up to 20-30 mmHg graduated compression stockings.  He also has Lasix that he can use as needed. 4. CAD: No angina.  Continue aggressive secondary prevention.  Single-vessel bypass of the time of his valve surgery.  LDL 58 in February 2019.  Continue current lipid-lowering therapy with atorvastatin and Zetia. 5. Creatinine 1.1 and hemoglobin 14.4 in February 2019.  Okay to continue Eliquis.  If hemorrhoid surgery needed, could hold Eliquis for a few days.    Current medicines are reviewed at length with the patient today.  The patient concerns regarding his medicines were addressed.  The following changes have been made:  No change  Labs/ tests ordered today include:  No orders of the defined types were placed in this encounter.   Recommend 150 minutes/week of aerobic exercise Low fat, low carb, high fiber diet recommended  Disposition:   FU in 1 year   Signed, [name], MD  08/02/2017 9:05 AM    [facility], [place], [place]  [address] Phone: [phone]; Fax: [phone]

## 2017-08-02 ENCOUNTER — Ambulatory Visit: Payer: Medicare Other | Admitting: Interventional Cardiology

## 2017-08-02 ENCOUNTER — Encounter: Payer: Self-pay | Admitting: Interventional Cardiology

## 2017-08-02 VITALS — BP 130/50 | HR 65 | Ht 72.0 in | Wt 201.0 lb

## 2017-08-02 DIAGNOSIS — Z9889 Other specified postprocedural states: Secondary | ICD-10-CM | POA: Diagnosis not present

## 2017-08-02 DIAGNOSIS — I2510 Atherosclerotic heart disease of native coronary artery without angina pectoris: Secondary | ICD-10-CM

## 2017-08-02 DIAGNOSIS — Z952 Presence of prosthetic heart valve: Secondary | ICD-10-CM | POA: Diagnosis not present

## 2017-08-02 DIAGNOSIS — I483 Typical atrial flutter: Secondary | ICD-10-CM

## 2017-08-02 DIAGNOSIS — E785 Hyperlipidemia, unspecified: Secondary | ICD-10-CM | POA: Diagnosis not present

## 2017-08-02 MED ORDER — METOPROLOL TARTRATE 25 MG PO TABS: 12.5000 mg | ORAL_TABLET | Freq: Two times a day (BID) | ORAL | 3 refills | Status: DC

## 2017-08-02 MED ORDER — APIXABAN 5 MG PO TABS: 5.0000 mg | ORAL_TABLET | Freq: Two times a day (BID) | ORAL | 3 refills | Status: DC

## 2017-08-02 NOTE — Patient Instructions (Signed)
Medication Instructions:  Your physician recommends that you continue on your current medications as directed. Please refer to the Current Medication list given to you today.   Labwork: None ordered  Testing/Procedures: None ordered  Follow-Up: Your physician wants you to follow-up in: 1 year with Dr. Irish Lack. You will receive a reminder letter in the mail two months in advance. If you don't receive a letter, please call our office to schedule the follow-up appointment.   Any Other Special Instructions Will Be Listed Below (If Applicable).  You may use compression stockings for the swelling in your legs. You can increase the strength to 20-30 mmHg.   If you need a refill on your cardiac medications before your next appointment, please call your pharmacy.

## 2017-08-24 ENCOUNTER — Other Ambulatory Visit: Payer: Self-pay | Admitting: Interventional Cardiology

## 2017-10-16 ENCOUNTER — Other Ambulatory Visit: Payer: Self-pay | Admitting: Interventional Cardiology

## 2018-04-15 ENCOUNTER — Telehealth: Payer: Self-pay | Admitting: Interventional Cardiology

## 2018-04-15 NOTE — Telephone Encounter (Signed)
New message    Pt c/o Shortness Of Breath: STAT if SOB developed within the last 24 hours or pt is noticeably SOB on the phone  1. Are you currently SOB (can you hear that pt is SOB on the phone)? No  2. How long have you been experiencing SOB? No les than 10 days  3. Are you SOB when sitting or when up moving around? Moving around-going up and down stairs 4. Are you currently experiencing any other symptoms? Pt wife states that he has had a cough

## 2018-04-15 NOTE — Telephone Encounter (Signed)
Returned call to patient who states that he has had swelling in his lower extremities for the past 10 days. He states that he does have some SOB with exertion (ie. climbing stairs) and has a cough. Patient denies having any chest pain, palpitations, irregular heartbeats, or any other Sx. Patient denies weight gain. Patient is currently in [place] and will return next week. Patient requesting an appointment. Instructed for the patient to use prn lasix, use compression stockings and elevate legs to help with swelling, and avoid salt in his diet. Appointment made for patient to see Dr. Irish Lack on 12/4.

## 2018-04-21 ENCOUNTER — Telehealth: Payer: Self-pay | Admitting: Interventional Cardiology

## 2018-04-21 NOTE — Telephone Encounter (Signed)
Left message to call back  

## 2018-04-21 NOTE — Telephone Encounter (Signed)
New Message           Patient was admitted in the hospital in [place], they are now back in [place]. Patient was diagnosed with a moderate Aorta leaky valve, retaining a lot fluid, (31/2 liters) and also top of red blood cells are breaking down due to leak. Patient is asking to come in today if possible.

## 2018-04-22 ENCOUNTER — Telehealth: Payer: Self-pay | Admitting: Interventional Cardiology

## 2018-04-22 NOTE — Telephone Encounter (Signed)
Returned call to patient's wife. She states that she attempted to get records from [place] but they are requesting that we sent a request to them on a letterhead. Information given to Eric [name] in Medical Records who will request the records from [facility].

## 2018-04-22 NOTE — Telephone Encounter (Signed)
Follow up:   Patient wife returning call concerning some paper work. Please call wife [phone] on her cell phone.

## 2018-04-22 NOTE — Telephone Encounter (Signed)
Patient's wife calling and states that while they were in [place] the patient ended up going to the hospital because his SOB and LEE worsened. Per wife, patient was found to be in Afib and echo showed moderate AI. She states that he was given IV lasix. She states that they are back home and his SOB and LEE are better. Wife states that the patient is weighing daily and has not gained any weight and has not had to take any prn lasix. Wife has requested records from [place] be sent to [place]. Patient will keep follow up appointment tomorrow at 2:20 PM.

## 2018-04-23 ENCOUNTER — Ambulatory Visit
Admission: RE | Admit: 2018-04-23 | Discharge: 2018-04-23 | Disposition: A | Discharge status: Home / Self Care | Payer: Medicare Other | Source: Ambulatory Visit | Attending: Interventional Cardiology | Admitting: Interventional Cardiology

## 2018-04-23 ENCOUNTER — Encounter (INDEPENDENT_AMBULATORY_CARE_PROVIDER_SITE_OTHER): Payer: Self-pay

## 2018-04-23 ENCOUNTER — Ambulatory Visit: Payer: Medicare Other | Admitting: Interventional Cardiology

## 2018-04-23 ENCOUNTER — Encounter: Payer: Self-pay | Admitting: Interventional Cardiology

## 2018-04-23 VITALS — BP 136/64 | HR 78 | Ht 72.0 in | Wt 193.8 lb

## 2018-04-23 DIAGNOSIS — I351 Nonrheumatic aortic (valve) insufficiency: Secondary | ICD-10-CM | POA: Insufficient documentation

## 2018-04-23 DIAGNOSIS — I5033 Acute on chronic diastolic (congestive) heart failure: Secondary | ICD-10-CM

## 2018-04-23 DIAGNOSIS — Z952 Presence of prosthetic heart valve: Secondary | ICD-10-CM | POA: Insufficient documentation

## 2018-04-23 DIAGNOSIS — R0602 Shortness of breath: Secondary | ICD-10-CM

## 2018-04-23 DIAGNOSIS — I4891 Unspecified atrial fibrillation: Secondary | ICD-10-CM

## 2018-04-23 IMAGING — CR DG CHEST 2V
2 series · 2 of 2 positions shown · non-contrast
Comparison: [DATE]

CLINICAL DATA: Shortness of breath over the last 2 weeks. Atrial
flutter.

EXAM:
CHEST - 2 VIEW

[w chest pa]
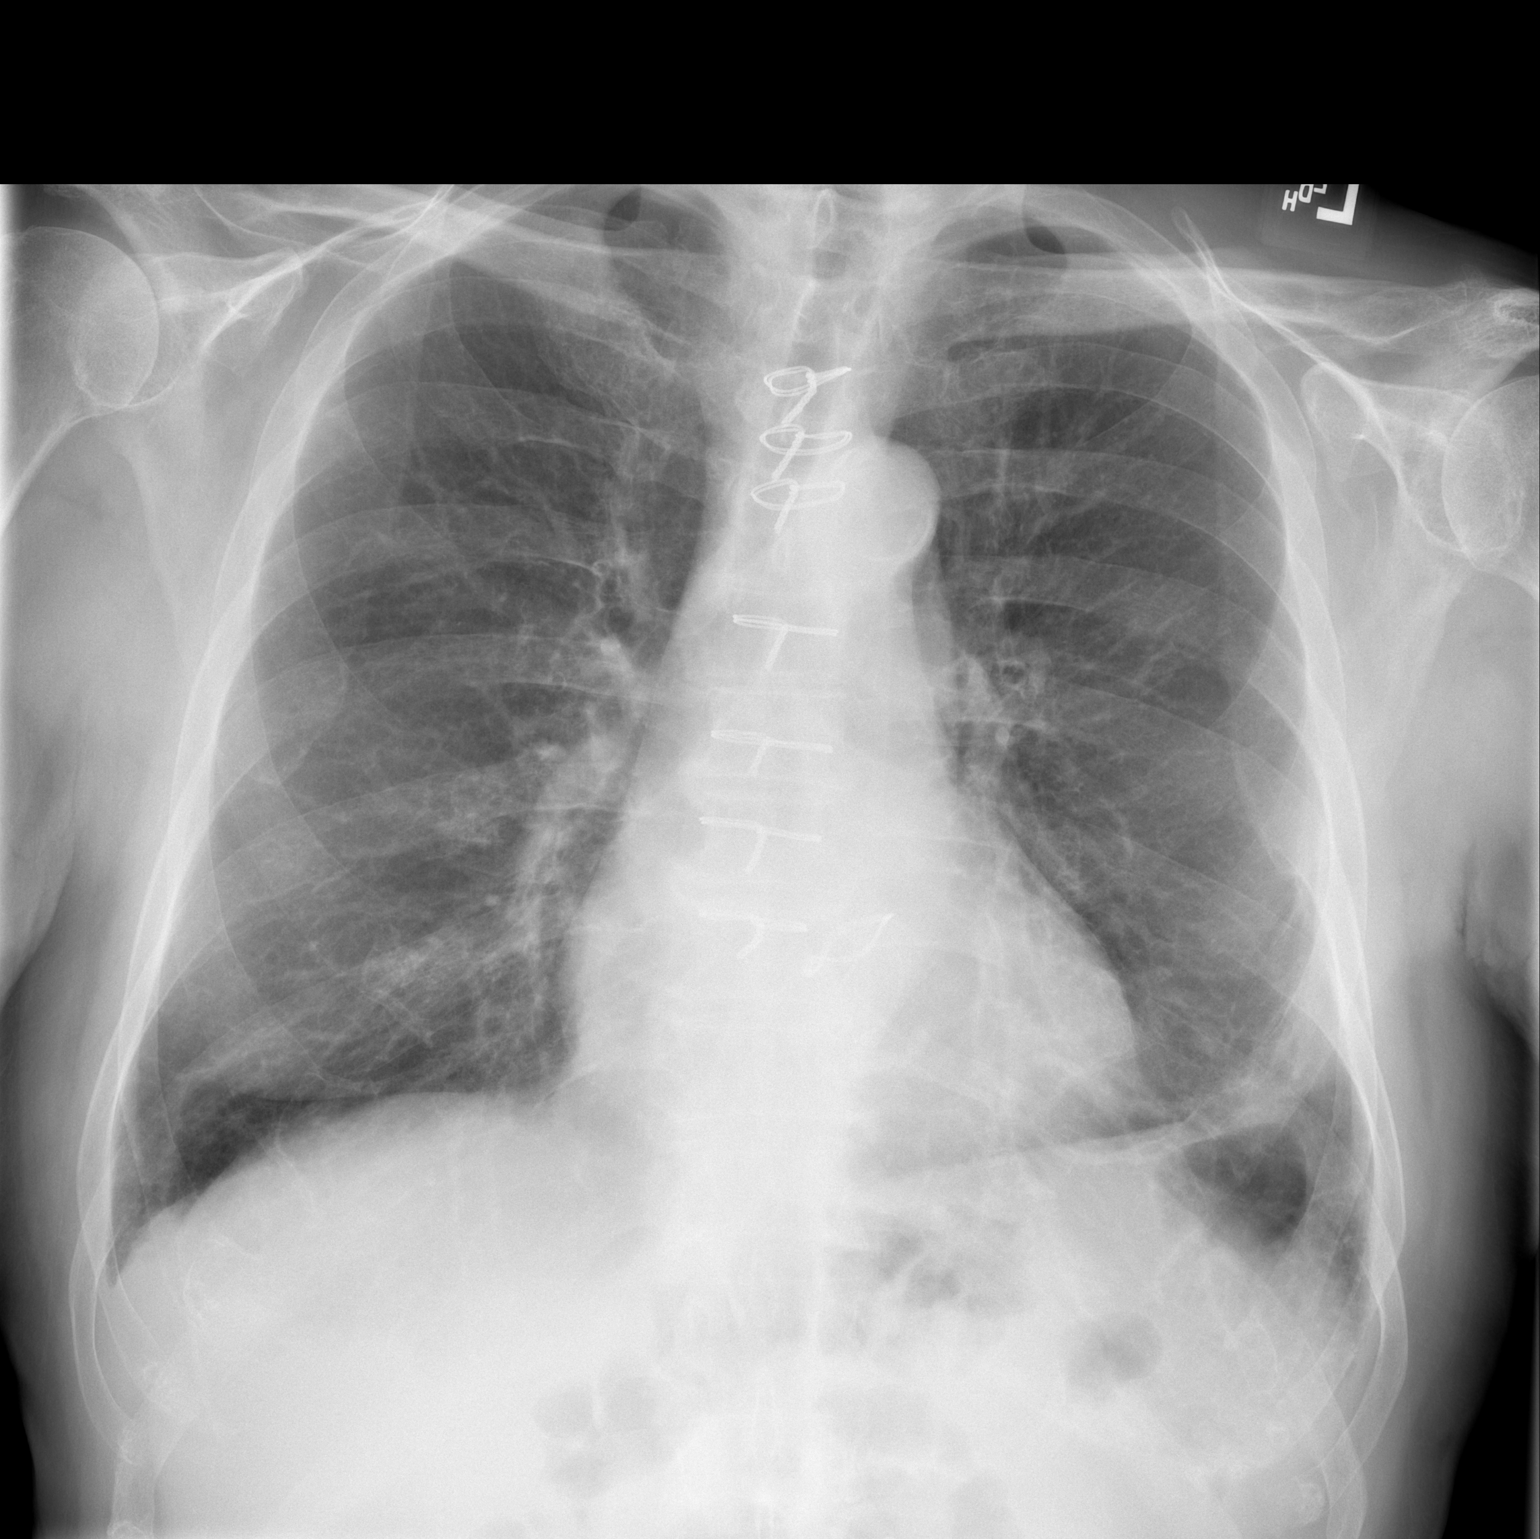

[w chest lat]
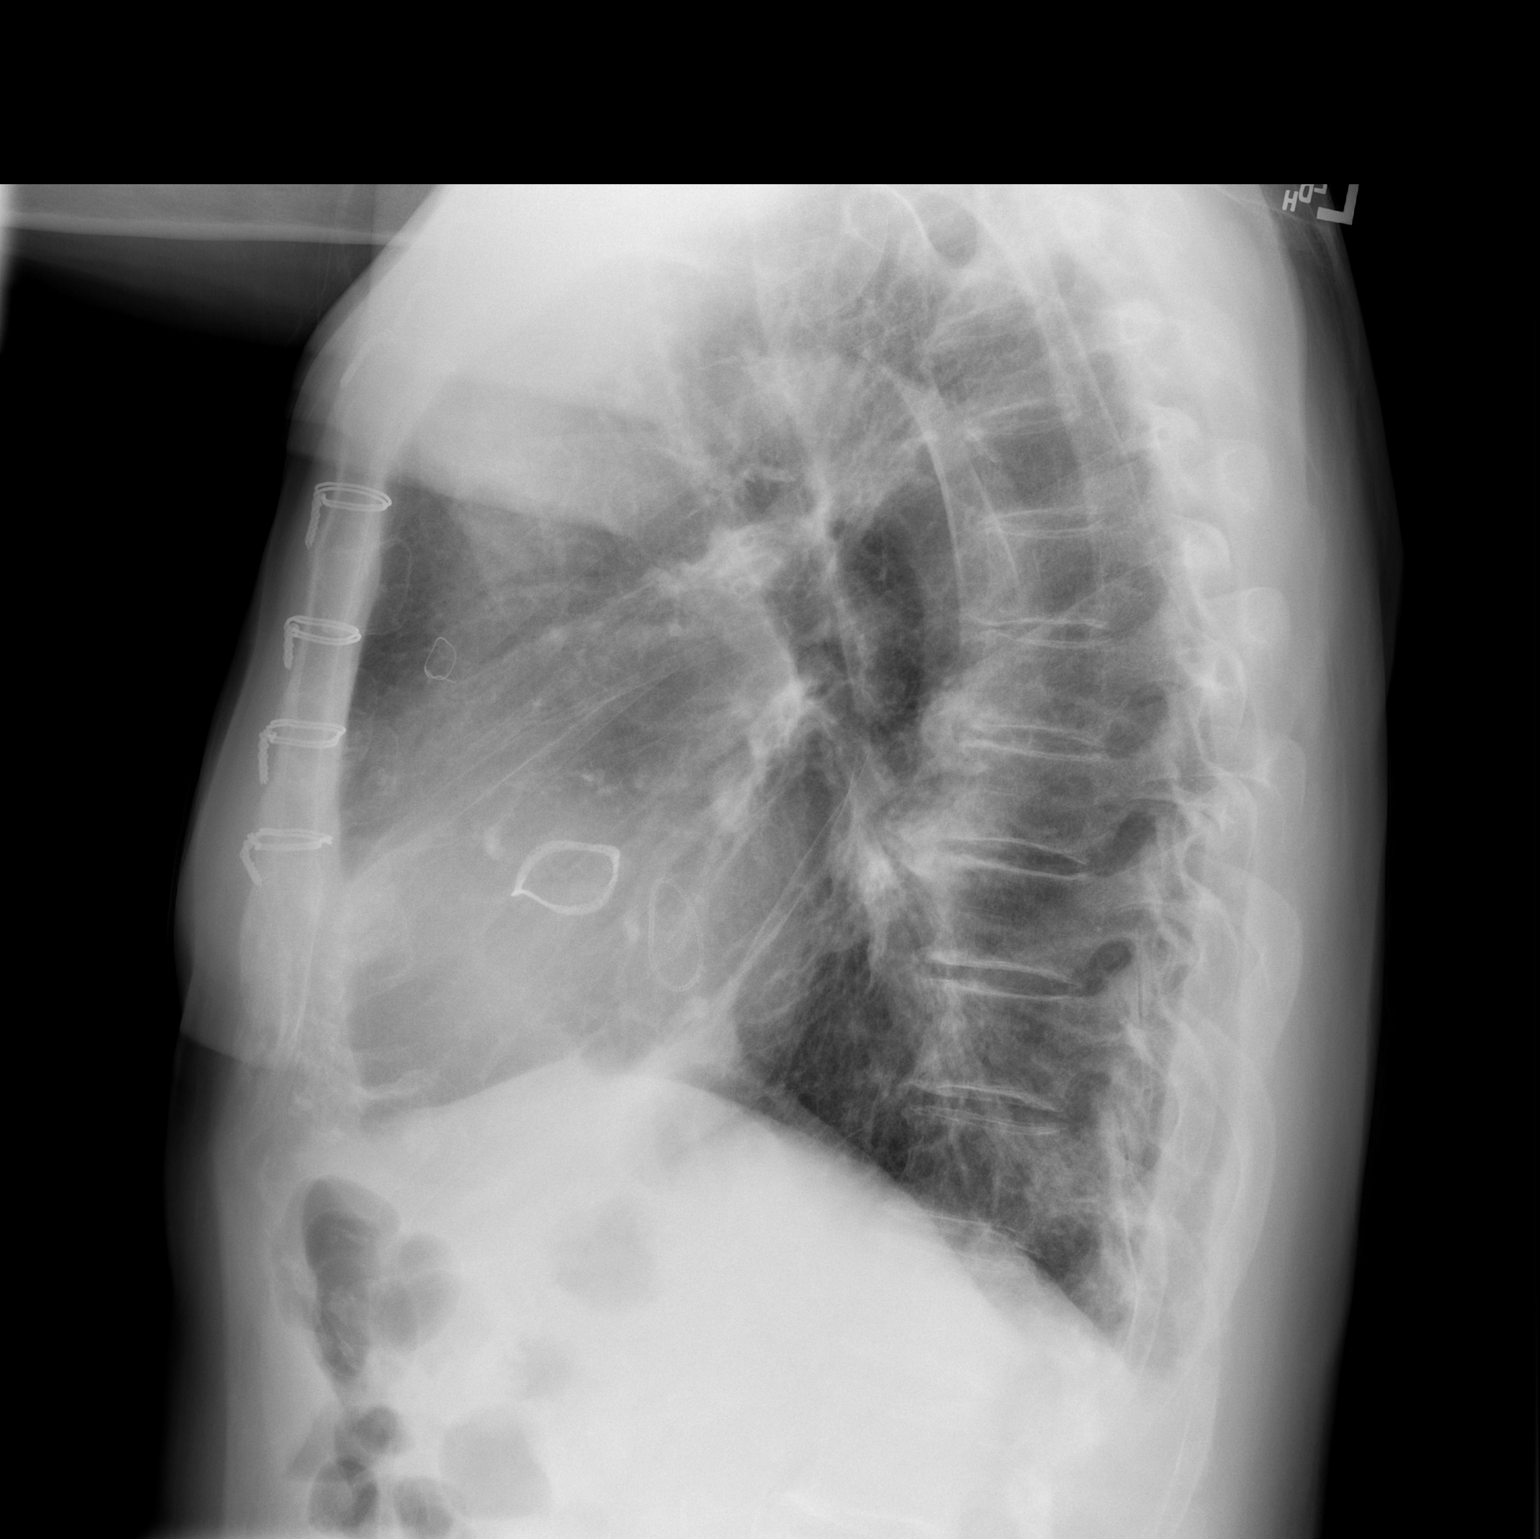

[2 of 2 positions shown; findings below may reference images not displayed]

FINDINGS: Previous median sternotomy and CABG. Cardiac valve replacements.
Heart size is stable. There's chronic pleural and parenchymal
scarring, most pronounced at the left base. No evidence of edema or
effusions. No acute bone finding.
IMPRESSION: No active process identified. Previous CABG and cardiac valve
replacements. Chronic pleural and parenchymal scarring at the bases
left more than right.

## 2018-04-23 NOTE — Patient Instructions (Signed)
Medication Instructions:  Your physician recommends that you continue on your current medications as directed. Please refer to the Current Medication list given to you today.  If you need a refill on your cardiac medications before your next appointment, please call your pharmacy.   Lab work: A chest x-ray takes a picture of the organs and structures inside the chest, including the heart, lungs, and blood vessels. This test can show several things, including, whether the heart is enlarges; whether fluid is building up in the lungs; and whether pacemaker / defibrillator leads are still in place.  Chest X-ray Instructions:    1. You may have this done at the [facility], located in the        [facility] on the 1st floor.    2. You do no have to have an appointment.    3. [place], [place] [address]        [phone]        Monday - Friday  8:00 am - 5:00 pm   If you have labs (blood work) drawn today and your tests are completely normal, you will receive your results only by: Marland Kitchen MyChart Message (if you have MyChart) OR . A paper copy in the mail If you have any lab test that is abnormal or we need to change your treatment, we will call you to review the results.  Testing/Procedures: None ordered  Follow-Up: . Follow up with Dr. Irish Lack in January  Any Other Special Instructions Will Be Listed Below (If Applicable).

## 2018-04-23 NOTE — Progress Notes (Signed)
Cardiology Office Note   Date:  04/23/2018   ID:  Eric [name], DOB 08/17/1929, MRN [id number]  PCP:  [name], MD    No chief complaint on file.  AVR  Wt Readings from Last 3 Encounters:  04/23/18 193 lb 12.8 oz (87.9 kg)  08/02/17 201 lb (91.2 kg)  09/04/16 195 lb 8 oz (88.7 kg)       History of Present Illness: Eric [name] is a 84 y.o. male  who has had an aortic valve replacement and single vessel bypass in 2009. Hehad a negative stress test in 2011. No chest pain prior to valve replacement surgery. He did have SHOB.   In late 2015, his heart rate was noted to be elevated at the time of a tooth extraction. He was found to be in atrial flutter. He underwent successful cardioversion. He notes that during the time he was out of rhythm, he had a cough. This got better after the cardioversion.   He went to [place] in February 2016 for about 3 weeks and did well on the NOAC. Less bleeding and bruising on Eliquis compared to his experience Coumadin. He then changed from Coumadin to Eliquisto treat multiple DVT and PE.  Hehad atooth extraction. Whenhe stayed on his anticoagulation, he had significant bleeding issues at that time.THe next time,he cameoff of the Eliquis.   Inlate 2017, he had several instances of blood clots in his urine. He underwent cystoscopy in the urology clinic after holding out was for 4 days. Thisshowed a malignancy. He had surgery and tolerated this well.  He has noticed more edema in his right leg since surgery back in 2017.It persists.   He also has an external hemorrhoid.  He may need this banded.   In late 2019, he was in [place] and was volume overloaded and short of breath. He was given IV Lasix and diuresed well. He was kept overnight.  He was also found to be in AFib.  He dd not feel  Palpitations.    They were eating out all of the time.  He had stopped his Lasix with his bladder cancer.  Echo showed normal LV  function and 3+ AI.       Past Medical History:  Diagnosis Date  . Atrial flutter, paroxysmal ([place])    a. dx 04-02-2014, s/p  successful cardioversion 04-06-2014.  . Bilateral lower extremity edema    CHRONIC  . Bladder tumor   . Chronic anticoagulation    on Eliquis--  due to recurrent dvt's and pe's  . Dyslipidemia   . Frequency of urination   . History of basal cell carcinoma excision    06/ 2012  left upper back;   right forehead 07/ 2014  . History of bladder cancer urologist-  dr [name]   02-13-2016  s/p TURBT per path high grade papillary urothelial carcinoma  . History of DVT (deep vein thrombosis)    2000-- RLE  . History of melanoma excision    left flank;  07/ 2014 left lower leg and right upper chest  . History of prostate cancer    Gleason 6--  s/p  radical prostatectomy 06/ 2000 in [place]---  no recurrence  . History of pulmonary embolus (PE)    2000 & 2005  bilateral  . Hx of valvuloplasty 06/19/2007   a. s/p mitral ring annuloplasty, repair ruptured chordae of P2 flail segments of MV  . Nocturia more than twice per night   .  S/P aortic valve replacement with bioprosthetic valve    06-19-2007  severe aortic valve stenosis  . S/P CABG x 1    06-19-2007  SVG to OM1  . Single vessel coronary artery disease   cardiologist-  dr Irish Lack   a. 05/2007 CABG x 1: s/p SVG-OM;  b. 10/2010 Ex MV: EF 72%, inf attenuation w/o ischemia, brief run of PAT with exercise. To  . Thrombocytopenia ([place])   . Thyroid goiter 2013   nodular  . Urgency of urination   . Wears hearing aid    BILATERAL  . Wears partial dentures    UPPER    Past Surgical History:  Procedure Laterality Date  . AORTIC VALVE REPLACEMENT (AVR)/CORONARY ARTERY BYPASS GRAFTING (CABG)  06-19-2007  dr [name]   SVG to OM1 ;   AVR w/ #23 Mitralflow pericardial and ligation left atrial appendage;  MV repair w/ #28 Sorin 3-D memo Ring Annuloplasty with repair ruptured chordar of P-2 flail segments  .  CARDIAC CATHETERIZATION  05-12-2007  dr Leonia Reeves   single vessel 30% ostial LAD,  80% LCx;  severe to critial AS,  moderate MR,  normal LVF, ef 70%,  normal right heart pressures and cardiac outputs,  mild elevated LV end-diastolic pressure    . CARDIOVASCULAR STRESS TEST  11-02-2010  dr Irish Lack   normal nuclear study w/ no ischemia or infarct/scar/  normal LV function and wall motion , ef 72%  . CARDIOVERSION N/A 04/06/2014   Procedure: CARDIOVERSION;  Surgeon: [name], MD;  Location: [facility];  Service: Cardiovascular;  Laterality: N/A;   successful  . CATARACT EXTRACTION W/ INTRAOCULAR LENS  IMPLANT, BILATERAL  2012  approx  . RETROPUBIC RADICAL PROSTATECTOMY  06/ 2000  in [place], [place] Left 09/1998  . TEE WITHOUT CARDIOVERSION N/A 04/06/2014   Procedure: TRANSESOPHAGEAL ECHOCARDIOGRAM (TEE);  Surgeon: [name], MD;  Location: [facility] END[facility]COPY;  Service: Cardiovascular;  Laterality: N/A;  mild focal basa LVH of the septum, ef 50-55%, s/p AV annuloplasty ring, elongated chordae, thickened leaflets, mild to mod. MR, multiple small jets/arotic bioprosthetic valve sits well in position, mild AI/ thickened TV w/ mild reurg./mild LA  . TRANSTHORACIC ECHOCARDIOGRAM  03-23-2016   dr Irish Lack   EF 55-60%, reduced contribution atrial contraction to ventricular filling, due to increased ventricular diastolic pressure or atrial contratile dysfunction/  bioprosthetic AV w/ mod. regurg. (valve area 1.97cm^2)/ mild dilated ascending aorta/ mild thicken MV normal function annular ring prosthesis/severe LAE & mod. to sev.RAE/ PASP 22mmHg/ mild TR/ ventricle septal motion show paradox  . TRANSURETHRAL RESECTION OF BLADDER TUMOR N/A 02/13/2016   Procedure: TRANSURETHRAL RESECTION OF BLADDER TUMOR (TURBT);  Surgeon: Nickie Retort, MD;  Location: [facility];  Service: Urology;  Laterality: N/A;  . TRANSURETHRAL RESECTION OF BLADDER TUMOR N/A 09/04/2016    Procedure: TRANSURETHRAL RESECTION OF BLADDER TUMOR (TURBT);  Surgeon: Nickie Retort, MD;  Location: [facility];  Service: Urology;  Laterality: N/A;     Current Outpatient Medications  Medication Sig Dispense Refill  . apixaban (ELIQUIS) 5 MG TABS tablet Take 1 tablet (5 mg total) by mouth 2 (two) times daily. 180 tablet 3  . atorvastatin (LIPITOR) 40 MG tablet TAKE 1 TABLET EVERY EVENING 90 tablet 3  . ezetimibe (ZETIA) 10 MG tablet Take 1 tablet (10 mg total) by mouth every evening. 90 tablet 3  . furosemide (LASIX) 20 MG tablet Take 20 mg by mouth daily as needed (  swelling).    . metoprolol tartrate (LOPRESSOR) 25 MG tablet Take 0.5 tablets (12.5 mg total) by mouth 2 (two) times daily. 90 tablet 3   No current facility-administered medications for this visit.     Allergies:   Patient has no known allergies.    Social History:  The patient  reports that he has never smoked. He has never used smokeless tobacco. He reports that he drinks about 1.0 - 2.0 standard drinks of alcohol per week. He reports that he does not use drugs.   Family History:  The patient's family history includes Leukemia in his mother.    ROS:  Please see the history of present illness.   Otherwise, review of systems are positive for cough.   All other systems are reviewed and negative.    PHYSICAL EXAM: VS:  BP 136/64   Pulse 78   Ht 6' (1.829 m)   Wt 193 lb 12.8 oz (87.9 kg)   SpO2 95%   BMI 26.28 kg/m  , BMI Body mass index is 26.28 kg/m. GEN: Well nourished, well developed, in no acute distress  HEENT: normal  Neck: no JVD, carotid bruits, or masses Cardiac: irregularly irregular; no murmurs, rubs, or gallops,no edema  Respiratory:  clear to auscultation bilaterally, normal work of breathing GI: soft, nontender, nondistended, + BS MS: no deformity or atrophy  Skin: warm and dry, no rash Neuro:  Strength and sensation are intact Psych: euthymic mood, full affect   EKG:     The ekg ordered today demonstrates AFib, rate controlled   Recent Labs: No results found for requested labs within last 8760 hours.   Lipid Panel No results found for: CHOL, TRIG, HDL, CHOLHDL, VLDL, LDLCALC, LDLDIRECT   Other studies Reviewed: Additional studies/ records that were reviewed today with results demonstrating: echo from [place] reviewed.  CXR in [place] showed bilateral pleural effusions   ASSESSMENT AND PLAN:  1. Acute on Chronic diastolic heart failure: Cough may be from volume overload.  Continue Lasix 20 mg daily.  If there is still excess volume, would increase to 40 mg daily. Could also consider cardioversion to improve forward flow.   May need to look at valve with TEE if heart failure sx are refractory. 2. S/p MVR/AVR: Noted to have at least moderate AI on echo in [place].   3. LE edema: Elevate legs. Compression stockings.   4. CAD: No angina.  COntinue aggressive secondary prevention.     Current medicines are reviewed at length with the patient today.  The patient concerns regarding his medicines were addressed.  The following changes have been made:  No change  Labs/ tests ordered today include: CXR No orders of the defined types were placed in this encounter.   Recommend 150 minutes/week of aerobic exercise Low fat, low carb, high fiber diet recommended  Disposition:   FU in 1-2 months   Signed, [name], MD  04/23/2018 2:50 PM    [facility] [address], [place], [place]  [address] Phone: [phone]; Fax: [phone]

## 2018-04-24 ENCOUNTER — Telehealth: Payer: Self-pay

## 2018-04-24 DIAGNOSIS — R0602 Shortness of breath: Secondary | ICD-10-CM

## 2018-04-24 MED ORDER — FUROSEMIDE 20 MG PO TABS: 20.0000 mg | ORAL_TABLET | Freq: Every day | ORAL | 1 refills | Status: DC

## 2018-04-24 NOTE — Telephone Encounter (Signed)
-----   Message from Jettie Booze, MD sent at 04/24/2018 11:20 AM EST ----- No edema or pleural effusions presents.  Looks like fluid is gone.  OK to take lasix 40 for the next 2 days and then back to Lasix 20 mg daily after that.  Can increase to 40 mg in a day if fluid retention occurs, weight gain, swelling etc... Consider TEE to look at valve and cardioversion to restore NSR if fluid retention persists.

## 2018-04-24 NOTE — Telephone Encounter (Signed)
Called and made patient aware of CXR results. Made patient aware that if he continues to retain fluid he may require TEE to assess valve and DCCV. Made patient aware of recommendations to take lasix 40 mg QD x 2 days and then go back to taking lasix 20 mg QD after that. Patient states that he has only been taking lasix 20 mg QD PRN. Will discuss with Dr. Irish Lack

## 2018-04-24 NOTE — Telephone Encounter (Deleted)
Called and made patient aware of CXR results. Made patient aware that if he continues to retain fluid he may require TEE to assess valve and DCCV. Made patient aware of recommendations to take lasix 40 mg QD x 2 days and then go back to taking lasix 20 mg QD after that. Patient states that he has only been taking lasix 20 mg QD PRN. Will discuss with Dr. Irish Lack

## 2018-04-24 NOTE — Telephone Encounter (Signed)
Discussed with Dr. Irish Lack and patient will take lasix 40 mg QD x 2 days and then start taking lasix 20 mg QD. Per Dr. Irish Lack, patient does not need a potassium supplement for now and will need to eat foods that are high in potassium. Patient will need BMET in 2 weeks.  Called and made patient aware of the above information. Lab appointment for BMET made for 12/20. Rx sent to preferred pharmacy. Patient verbalized understanding and thanked me for the call.

## 2018-05-09 ENCOUNTER — Other Ambulatory Visit: Payer: Medicare Other

## 2018-05-09 DIAGNOSIS — R0602 Shortness of breath: Secondary | ICD-10-CM

## 2018-05-10 LAB — BASIC METABOLIC PANEL
BUN/Creatinine Ratio: 24 (ref 10–24)
BUN: 27 mg/dL (ref 8–27)
CALCIUM: 9.6 mg/dL (ref 8.6–10.2)
CO2: 24 mmol/L (ref 20–29)
Chloride: 106 mmol/L (ref 96–106)
Creatinine, Ser: 1.13 mg/dL (ref 0.76–1.27)
GFR calc Af Amer: 68 mL/min/{1.73_m2} (ref >59)
GFR calc non Af Amer: 59 mL/min/{1.73_m2} — ABNORMAL LOW (ref >59)
GLUCOSE: 114 mg/dL — AB (ref 65–99)
Potassium: 4.8 mmol/L (ref 3.5–5.2)
Sodium: 145 mmol/L — ABNORMAL HIGH (ref 134–144)

## 2018-05-15 ENCOUNTER — Telehealth: Payer: Self-pay | Admitting: Interventional Cardiology

## 2018-05-15 DIAGNOSIS — Z79899 Other long term (current) drug therapy: Secondary | ICD-10-CM

## 2018-05-15 DIAGNOSIS — I5032 Chronic diastolic (congestive) heart failure: Secondary | ICD-10-CM

## 2018-05-15 MED ORDER — FUROSEMIDE 20 MG PO TABS: 40.0000 mg | ORAL_TABLET | Freq: Every day | ORAL | 3 refills | Status: DC

## 2018-05-15 NOTE — Telephone Encounter (Signed)
Patient's wife calling about patient having SOB and pitting edema in BLE. Patient complaining of not being about to sleep at night due to SOB. Patient and his wife are traveling from [place] to [place] at this time. Patient's BP is 170/68 and HR 80's. Patient has been taking lasix 20 mg daily, but took 40 mg yesterday. Taking the extra lasix helped but his symptoms did not go away. Patient has CHF and recent lab work was stable. Dr. Irish Lack has stated in patient's chart that patient would be considered for TEE to look at valve and cardioversion to restore NSR if fluid retention persist. Consulted Dr. Caryl Comes, DOD, he suggest patient take Lasix 80 mg for 3 days and then go to 40 mg daily. He also recommended patient to go to ED if he did not produce a lot of urine with the Lasix 80 mg. Patient at this time does not need a potassium pill, potassium is 4.8 from lab work on 12/20.  Called patient's wife back and informed her of Dr. Olin Pia recommendations. Patient's wife verbalized understanding and when they arrive at home they will take Lasix 80 mg. Will forward to Dr. Irish Lack and his nurse for further advisement.

## 2018-05-15 NOTE — Telephone Encounter (Signed)
New Message   Pt c/o swelling: STAT is pt has developed SOB within 24 hours  1) How much weight have you gained and in what time span?unknown, but patient has been holding between 189-191 they were out of town so did not weigh because the scales are different  2) If swelling, where is the swelling located? ankle  3) Are you currently taking a fluid pill? yes  4) Are you currently SOB? Slightly, but no chest pain   5) Do you have a log of your daily weights (if so, list)? Not at the time of the call they were on the road from [place] back to [place] at the time of the call  6) Have you gained 3 pounds in a day or 5 pounds in a week? unknown  7) Have you traveled recently? Yes   Wife is calling on behalf of patient. Currently they are in the [place] area and signal is spotty so if you call and no one answers. Please call back.

## 2018-05-15 NOTE — Telephone Encounter (Signed)
I agree with the extra Lasix for 3 days.  He will need BMet early next week.

## 2018-05-15 NOTE — Telephone Encounter (Signed)
Called patient's wife with recommendation for BMET early next week. Patient will come in on Tuesday for BMET.

## 2018-05-20 ENCOUNTER — Other Ambulatory Visit: Payer: Medicare Other | Admitting: *Deleted

## 2018-05-20 DIAGNOSIS — I5032 Chronic diastolic (congestive) heart failure: Secondary | ICD-10-CM

## 2018-05-20 DIAGNOSIS — Z79899 Other long term (current) drug therapy: Secondary | ICD-10-CM

## 2018-05-20 LAB — BASIC METABOLIC PANEL
BUN / CREAT RATIO: 18 (ref 10–24)
BUN: 22 mg/dL (ref 8–27)
CHLORIDE: 98 mmol/L (ref 96–106)
CO2: 25 mmol/L (ref 20–29)
Calcium: 9.5 mg/dL (ref 8.6–10.2)
Creatinine, Ser: 1.21 mg/dL (ref 0.76–1.27)
GFR calc non Af Amer: 54 mL/min/{1.73_m2} — ABNORMAL LOW (ref >59)
GFR, EST AFRICAN AMERICAN: 62 mL/min/{1.73_m2} (ref >59)
Glucose: 132 mg/dL — ABNORMAL HIGH (ref 65–99)
POTASSIUM: 4.2 mmol/L (ref 3.5–5.2)
Sodium: 140 mmol/L (ref 134–144)

## 2018-06-13 ENCOUNTER — Telehealth: Payer: Self-pay | Admitting: Interventional Cardiology

## 2018-06-13 NOTE — Telephone Encounter (Signed)
Returned call to patient. The patient had a spot removed from his ear yesterday and is having some pain and wanted to know if it was okay to take ibuprofen. Made patient aware that since he is taking eliquis he should avoid NSAIDs (ibuprofen, advil, aleve, etc.). Made patient aware that he should try taking tylenol. Patient verbalized understanding and thanked me for the call.

## 2018-06-13 NOTE — Telephone Encounter (Signed)
New Message:     Pt had procedure yesterday. Today he is in pain and wants to know if he can take Ibuprofen,since he is on Eliquis? If not, what can he take?

## 2018-06-18 NOTE — Progress Notes (Addendum)
Cardiology Office Note   Date:  06/19/2018   ID:  Eric [name], DOB 1929/08/29, MRN [id number]  PCP:  [name], MD    No chief complaint on file.  AVR  Wt Readings from Last 3 Encounters:  06/19/18 191 lb 12.8 oz (87 kg)  04/23/18 193 lb 12.8 oz (87.9 kg)  08/02/17 201 lb (91.2 kg)       History of Present Illness: Eric [name] is a 85 y.o. male  who has had an aortic valve replacement and single vessel bypass in 2009. Hehad a negative stress test in 2011. No chest pain prior to valve replacement surgery. He did have SHOB.   In late 2015, his heart rate was noted to be elevated at the time of a tooth extraction. He was found to be in atrial flutter. He underwent successful cardioversion. He notes that during the time he was out of rhythm, he had a cough. This got better after the cardioversion.   He went to [place] in February 2016 for about 3 weeks and did well on the NOAC. Less bleeding and bruising on Eliquis compared to his experience Coumadin. He then changed from Coumadin to Eliquisto treat multiple DVT and PE.  Hehad atooth extraction. Whenhe stayed on his anticoagulation, he had significant bleeding issues at that time.THe next time,he cameoff of the Eliquis.   Inlate 2017, he had several instances of blood clots in his urine. He underwent cystoscopy in the urology clinic after holding out was for 4 days. Thisshowed a malignancy. He had surgery and tolerated this well.  He has noticed more edema in his right leg since surgery back in 2017.It persists.  He also has an external hemorrhoid. He may need this banded.   In late 2019, he was in [place] and was volume overloaded and short of breath. He was given IV Lasix and diuresed well. He was kept overnight.  He was also found to be in AFib.  He dd not feel  Palpitations.    They were eating out all of the time.  He had stopped his Lasix with his bladder cancer.  Echo showed normal LV  function and 3+ AI.    He had been doing well.  A few days ago, he had a procedure on the left ear.  It was bleeding and it was cauterized.  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    He was started on an antibiotic.    Denies : Chest pain. Dizziness. Leg edema. Nitroglycerin use. Orthopnea. Palpitations. Paroxysmal nocturnal dyspnea. Shortness of breath. Syncope.   He feels his cough is better.       Past Medical History:  Diagnosis Date  . Atrial flutter, paroxysmal ([place])    a. dx 04-02-2014, s/p  successful cardioversion 04-06-2014.  . Bilateral lower extremity edema    CHRONIC  . Bladder tumor   . Chronic anticoagulation    on Eliquis--  due to recurrent dvt's and pe's  . Dyslipidemia   . Frequency of urination   . History of basal cell carcinoma excision    06/ 2012  left upper back;   right forehead 07/ 2014  . History of bladder cancer urologist-  dr [name]   02-13-2016  s/p TURBT per path high grade papillary urothelial carcinoma  . History of DVT (deep vein thrombosis)    2000-- RLE  . History of melanoma excision    left flank;  07/ 2014 left lower leg and right upper chest  .  History of prostate cancer    Gleason 6--  s/p  radical prostatectomy 06/ 2000 in [place]---  no recurrence  . History of pulmonary embolus (PE)    2000 & 2005  bilateral  . Hx of valvuloplasty 06/19/2007   a. s/p mitral ring annuloplasty, repair ruptured chordae of P2 flail segments of MV  . Nocturia more than twice per night   . S/P aortic valve replacement with bioprosthetic valve    06-19-2007  severe aortic valve stenosis  . S/P CABG x 1    06-19-2007  SVG to OM1  . Single vessel coronary artery disease   cardiologist-  dr Irish Lack   a. 05/2007 CABG x 1: s/p SVG-OM;  b. 10/2010 Ex MV: EF 72%, inf attenuation w/o ischemia, brief run of PAT with exercise. To  . Thrombocytopenia ([place])   . Thyroid goiter 2013   nodular  . Urgency of urination   . Wears hearing aid    BILATERAL  . Wears  partial dentures    UPPER    Past Surgical History:  Procedure Laterality Date  . AORTIC VALVE REPLACEMENT (AVR)/CORONARY ARTERY BYPASS GRAFTING (CABG)  06-19-2007  dr [name]   SVG to OM1 ;   AVR w/ #23 Mitralflow pericardial and ligation left atrial appendage;  MV repair w/ #28 Sorin 3-D memo Ring Annuloplasty with repair ruptured chordar of P-2 flail segments  . CARDIAC CATHETERIZATION  05-12-2007  dr Leonia Reeves   single vessel 30% ostial LAD,  80% LCx;  severe to critial AS,  moderate MR,  normal LVF, ef 70%,  normal right heart pressures and cardiac outputs,  mild elevated LV end-diastolic pressure    . CARDIOVASCULAR STRESS TEST  11-02-2010  dr Irish Lack   normal nuclear study w/ no ischemia or infarct/scar/  normal LV function and wall motion , ef 72%  . CARDIOVERSION N/A 04/06/2014   Procedure: CARDIOVERSION;  Surgeon: [name], MD;  Location: [facility];  Service: Cardiovascular;  Laterality: N/A;   successful  . CATARACT EXTRACTION W/ INTRAOCULAR LENS  IMPLANT, BILATERAL  2012  approx  . RETROPUBIC RADICAL PROSTATECTOMY  06/ 2000  in [place], [place] Left 09/1998  . TEE WITHOUT CARDIOVERSION N/A 04/06/2014   Procedure: TRANSESOPHAGEAL ECHOCARDIOGRAM (TEE);  Surgeon: [name], MD;  Location: [facility] ENDOSCOPY;  Service: Cardiovascular;  Laterality: N/A;  mild focal basa LVH of the septum, ef 50-55%, s/p AV annuloplasty ring, elongated chordae, thickened leaflets, mild to mod. MR, multiple small jets/arotic bioprosthetic valve sits well in position, mild AI/ thickened TV w/ mild reurg./mild LA  . TRANSTHORACIC ECHOCARDIOGRAM  03-23-2016   dr Irish Lack   EF 55-60%, reduced contribution atrial contraction to ventricular filling, due to increased ventricular diastolic pressure or atrial contratile dysfunction/  bioprosthetic AV w/ mod. regurg. (valve area 1.97cm^2)/ mild dilated ascending aorta/ mild thicken MV normal function annular ring prosthesis/severe LAE &  mod. to sev.RAE/ PASP 65mmHg/ mild TR/ ventricle septal motion show paradox  . TRANSURETHRAL RESECTION OF BLADDER TUMOR N/A 02/13/2016   Procedure: TRANSURETHRAL RESECTION OF BLADDER TUMOR (TURBT);  Surgeon: Nickie Retort, MD;  Location: [facility];  Service: Urology;  Laterality: N/A;  . TRANSURETHRAL RESECTION OF BLADDER TUMOR N/A 09/04/2016   Procedure: TRANSURETHRAL RESECTION OF BLADDER TUMOR (TURBT);  Surgeon: Nickie Retort, MD;  Location: [facility];  Service: Urology;  Laterality: N/A;     Current Outpatient Medications  Medication Sig Dispense Refill  . apixaban (  ELIQUIS) 5 MG TABS tablet Take 1 tablet (5 mg total) by mouth 2 (two) times daily. 180 tablet 3  . atorvastatin (LIPITOR) 40 MG tablet TAKE 1 TABLET EVERY EVENING 90 tablet 3  . cefdinir (OMNICEF) 300 MG capsule 300 mg 2 (two) times daily.     Marland Kitchen ezetimibe (ZETIA) 10 MG tablet Take 1 tablet (10 mg total) by mouth every evening. 90 tablet 3  . furosemide (LASIX) 20 MG tablet Take 2 tablets (40 mg total) by mouth daily. 90 tablet 3  . metoprolol tartrate (LOPRESSOR) 25 MG tablet Take 0.5 tablets (12.5 mg total) by mouth 2 (two) times daily. 90 tablet 3   No current facility-administered medications for this visit.     Allergies:   Patient has no known allergies.    Social History:  The patient  reports that he has never smoked. He has never used smokeless tobacco. He reports current alcohol use of about 1.0 - 2.0 standard drinks of alcohol per week. He reports that he does not use drugs.   Family History:  The patient's family history includes Leukemia in his mother.    ROS:  Please see the history of present illness.   Otherwise, review of systems are positive for leg swelling.   All other systems are reviewed and negative.    PHYSICAL EXAM: VS:  BP 134/60   Pulse 89   Ht 6' (1.829 m)   Wt 191 lb 12.8 oz (87 kg)   SpO2 97%   BMI 26.01 kg/m  , BMI Body mass index is 26.01  kg/m. GEN: Well nourished, well developed, in no acute distress  HEENT: normal  Neck: no JVD, carotid bruits, or masses Cardiac: irregularly irregular; 2/6 systolic, 2/6 holodiastolic murmurs, rubs, no gallops,; mild leg edema  Respiratory:  clear to auscultation bilaterally, normal work of breathing GI: soft, nontender, nondistended, + BS MS: no deformity or atrophy  Skin: warm and dry, no rash; black scar noted on left ear Neuro:  Strength and sensation are intact Psych: euthymic mood, full affect     Recent Labs: 05/20/2018: BUN 22; Creatinine, Ser 1.21; Potassium 4.2; Sodium 140   Lipid Panel No results found for: CHOL, TRIG, HDL, CHOLHDL, VLDL, LDLCALC, LDLDIRECT   Other studies Reviewed: Additional studies/ records that were reviewed today with results demonstrating: Prior labs reviewed; last potassium 4.2 in 12/19 .   ASSESSMENT AND PLAN:  1. S/p AVR: moderate AI on echo from [place]. If CHF cannot be managed with diuretics, would have to consider TEE. 2. CAD: No angina.  Continue aggressive secondary prevention. 3. Chronic diastolic heart failure: Appears euvolemic.  I encouraged him to decrease salt intake.  Continue with daily weights.  When he travels, given the flexibility to take an extra Lasix tablet if his weight increases or if he feels more volume overloaded.  Will check potassium today.  He has been managing his potassium with increased dietary intake of potassium. 4. Anticoagulated: No bleeding problems.  Continue Eliquis for stroke prevention. 5. AFib: rate controlled.  If CHF becomes refractory, could consider cardioversion.     Current medicines are reviewed at length with the patient today.  The patient concerns regarding his medicines were addressed.  The following changes have been made:  No change  Labs/ tests ordered today include:  No orders of the defined types were placed in this encounter.   Recommend 150 minutes/week of aerobic exercise Low  fat, low carb, high fiber diet recommended  Disposition:  FU in 2-3 months   Signed, [name], MD  06/19/2018 12:09 PM    [facility] [address], [place], [place]  [address] Phone: [phone]; Fax: [phone]

## 2018-06-19 ENCOUNTER — Encounter: Payer: Self-pay | Admitting: Interventional Cardiology

## 2018-06-19 ENCOUNTER — Ambulatory Visit: Payer: Medicare Other | Admitting: Interventional Cardiology

## 2018-06-19 VITALS — BP 134/60 | HR 89 | Ht 72.0 in | Wt 191.8 lb

## 2018-06-19 DIAGNOSIS — I5032 Chronic diastolic (congestive) heart failure: Secondary | ICD-10-CM | POA: Diagnosis not present

## 2018-06-19 DIAGNOSIS — Z7901 Long term (current) use of anticoagulants: Secondary | ICD-10-CM | POA: Diagnosis not present

## 2018-06-19 DIAGNOSIS — Z952 Presence of prosthetic heart valve: Secondary | ICD-10-CM

## 2018-06-19 DIAGNOSIS — I2510 Atherosclerotic heart disease of native coronary artery without angina pectoris: Secondary | ICD-10-CM

## 2018-06-19 NOTE — Patient Instructions (Signed)
Medication Instructions:  Your physician recommends that you continue on your current medications as directed. Please refer to the Current Medication list given to you today.  If you need a refill on your cardiac medications before your next appointment, please call your pharmacy.   Lab work: TODAY: BMET  If you have labs (blood work) drawn today and your tests are completely normal, you will receive your results only by: Marland Kitchen MyChart Message (if you have MyChart) OR . A paper copy in the mail If you have any lab test that is abnormal or we need to change your treatment, we will call you to review the results.  Testing/Procedures: None ordered  Follow-Up: . Your physician recommends that you schedule a follow-up appointment in: 2-3 months with Dr. Irish Lack .   Any Other Special Instructions Will Be Listed Below (If Applicable).

## 2018-06-20 LAB — BASIC METABOLIC PANEL
BUN/Creatinine Ratio: 19 (ref 10–24)
BUN: 23 mg/dL (ref 8–27)
CO2: 24 mmol/L (ref 20–29)
Calcium: 9.7 mg/dL (ref 8.6–10.2)
Chloride: 105 mmol/L (ref 96–106)
Creatinine, Ser: 1.2 mg/dL (ref 0.76–1.27)
GFR calc Af Amer: 63 mL/min/{1.73_m2} (ref >59)
GFR calc non Af Amer: 54 mL/min/{1.73_m2} — ABNORMAL LOW (ref >59)
Glucose: 90 mg/dL (ref 65–99)
Potassium: 4.3 mmol/L (ref 3.5–5.2)
SODIUM: 143 mmol/L (ref 134–144)

## 2018-07-16 LAB — COMPREHENSIVE METABOLIC PANEL WITH GFR: EGFR: 68

## 2018-07-29 ENCOUNTER — Telehealth: Payer: Self-pay | Admitting: Interventional Cardiology

## 2018-07-29 NOTE — Telephone Encounter (Signed)
Called and spoke to patient's wife earlier. We are working on getting patient a sooner appointment and are still waiting on labs from Dr. Laurann Montana. Patient may need TEE. Patient will be in [place] until late 3/17. Made wife aware that I will call her back with additional instructions.

## 2018-07-29 NOTE — Telephone Encounter (Signed)
Patient wife's patient called stating Dr. Laurann Montana called them yesterday to give them results of lab work and told them that he was going to talk to Dr. Irish Lack about it.   She stated that Dr. Laurann Montana told her they should be getting a call from our office. She wanted to make [place] aware they are in [place] right now so if you need to reach them it is best to call her cell phone.

## 2018-07-29 NOTE — Telephone Encounter (Signed)
Appointment made for 3/26.

## 2018-08-05 ENCOUNTER — Telehealth: Payer: Self-pay

## 2018-08-05 NOTE — Telephone Encounter (Signed)
Left message for patient to call back to discuss his appointment on 3/19.   ----[place] TO Marye Round Rondell Pardon, RN WHEN PATIENT CALLS BACK-----

## 2018-08-06 NOTE — Telephone Encounter (Signed)
Follow Up: ° ° ° °Returning your call from yesterday. °

## 2018-08-06 NOTE — Telephone Encounter (Signed)
Appointment Cancelled due to Coronavirus:  Called patient in regards to f/u appointment to arrange TEE with Dr. Irish Lack on 08/07/18.   Patient states that he has had some SOB when he exerts himself, but this is unchanged. Patient denies having any chest pain, lightheadedness, dizziness, syncope, cough, fever, or any other Sx. Made patient aware that we are holding off on scheduling elective TEEs at this time and if he was feeling okay we will postpone his appointment to arrange.  Patient states that he feels fine and is okay with cancelling appointment. Patient has been made aware that they will be contacted in the near future to reschedule.   Refills have been sent in if needed.   Patient understands to let us know if they develop any Sx before then.

## 2018-08-07 ENCOUNTER — Ambulatory Visit: Payer: Medicare Other | Admitting: Interventional Cardiology

## 2018-08-11 ENCOUNTER — Other Ambulatory Visit: Payer: Self-pay | Admitting: Interventional Cardiology

## 2018-08-12 ENCOUNTER — Telehealth: Payer: Self-pay | Admitting: Physician Assistant

## 2018-08-12 ENCOUNTER — Telehealth: Payer: Self-pay | Admitting: Student

## 2018-08-12 NOTE — Telephone Encounter (Signed)
   Called and spoke with patient to inform him that I spoke with Dr. Irish Lack who said it was okay for him to hold his Eliquis for 2 days given significant bleeding after having skin cancer removed yesterday. Patient voiced understanding and thanked me for calling back. Advised patient to call if he has any other concerns.  Darreld Mclean, PA-C 08/12/2018 11:22 AM

## 2018-08-12 NOTE — Telephone Encounter (Signed)
   Received page from answering service. Called and spoke with patient's wife Lelon Frohlich. Patient had skin cancer removed from his ear yesterday and has had significant bleeding since that time. Patient currently at skin surgery center and Dr. Rogelia Rohrer has controlled the bleeding but is asking that patient stop Eliquis for 2 days. Patient did stop his Eliquis 24 hours prior to surgery per Dr. Hassell Done recommendation. CHA2DS2-VASc score = 6 (CHF, CAD, VTE x2, age x2) and patient is at high risk for thromboembolism. However, per chart review, patient also has history of easy bleeding following procedures. Dr. Irish Lack is in the cath lab today so I will ask him for his recommendation and call the patient's wife back at [phone]. Patient's wife voiced understanding and thanked me for the call.  Darreld Mclean, PA-C 08/12/2018 8:59 AM

## 2018-08-13 ENCOUNTER — Telehealth: Payer: Self-pay

## 2018-08-13 NOTE — Telephone Encounter (Signed)
Follow Up:; ° ° °Returning your call. °

## 2018-08-13 NOTE — Progress Notes (Signed)
Virtual Visit via Video Note    Evaluation Performed:  Follow-up visit  This visit type was conducted due to national recommendations for restrictions regarding the COVID-19 Pandemic (e.g. social distancing).  This format is felt to be most appropriate for this patient at this time.  All issues noted in this document were discussed and addressed.  No physical exam was performed (except for noted visual exam findings with Video Visits).  Please refer to the patient's chart (MyChart message for video visits and phone note for telephone visits) for the patient's consent to telehealth for [name].  Date:  08/14/2018   ID:  Eric [name], DOB Oct 09, 1929, MRN [id number]  Patient Location:  [place] [address]   Provider location:   [place], [name]  PCP:  [name], MD  Cardiologist:  [name], MD  Electrophysiologist:  None   Chief Complaint:  Atrial fibrillation  History of Present Illness:    Eric [name] is a 85 y.o. male who presents via audio/video conferencing for a telehealth visit today.    He has had an aortic valve replacement and single vessel bypass in 2009. Hehad a negative stress test in 2011. No chest pain prior to valve replacement surgery. He did have SHOB.   In late 2015, his heart rate was noted to be elevated at the time of a tooth extraction. He was found to be in atrial flutter. He underwent successful cardioversion. He notes that during the time he was out of rhythm, he had a cough. This got better after the cardioversion.   He went to [place] in February 2016 for about 3 weeks and did well on the NOAC. Less bleeding and bruising on Eliquis compared to his experience Coumadin. He then changed from Coumadin to Eliquisto treat multiple DVT and PE.  Hehad atooth extraction. Whenhe stayed on his anticoagulation, he had significant bleeding issues at that time.THe next time,he cameoff of the Eliquis.   Inlate 2017,  he had several instances of blood clots in his urine. He underwent cystoscopy in the urology clinic after holding out was for 4 days. Thisshowed a malignancy. He had surgery and tolerated this well.  He has noticed more edema in his right leg since surgery back in 2017.It persists.  In late 2019, he was in [place] and was volume overloaded and short of breath. He was given IV Lasix and diuresed well. He was kept overnight. He was also found to be in AFib. He dd not feel Palpitations.   They were eating out all of the time. He had stopped his Lasix with his bladder cancer. Echo showed normal LV function and 3+ AI.   He had been doing well.  A few days ago, he had a procedure on the left ear.  It was bleeding and it was cauterized.  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     I spoke to his PMD, Dr. Laurann Montana.  His labs revealed concern for low grade hemolysis.  TEE was considered but the patient was in [place], and given the COVID outbreak, the procedure was postponed.    He required repeat surgical procedure on the squamous cell area.  He had some bleeding post procedure and required more stitches.  He required a skin graft.    The patient does not symptoms concerning for COVID-19 infection (fever, chills, cough, or new SHORTNESS OF BREATH).    Prior CV studies:   The following studies were reviewed today:  Cr. 1.2 in  Jan 2020  Past Medical History:  Diagnosis Date  . Atrial flutter, paroxysmal ([place])    a. dx 04-02-2014, s/p  successful cardioversion 04-06-2014.  . Bilateral lower extremity edema    CHRONIC  . Bladder tumor   . Chronic anticoagulation    on Eliquis--  due to recurrent dvt's and pe's  . Dyslipidemia   . Frequency of urination   . History of basal cell carcinoma excision    06/ 2012  left upper back;   right forehead 07/ 2014  . History of bladder cancer urologist-  dr [name]   02-13-2016  s/p TURBT per path high grade papillary urothelial carcinoma  . History  of DVT (deep vein thrombosis)    2000-- RLE  . History of melanoma excision    left flank;  07/ 2014 left lower leg and right upper chest  . History of prostate cancer    Gleason 6--  s/p  radical prostatectomy 06/ 2000 in [place]---  no recurrence  . History of pulmonary embolus (PE)    2000 & 2005  bilateral  . Hx of valvuloplasty 06/19/2007   a. s/p mitral ring annuloplasty, repair ruptured chordae of P2 flail segments of MV  . Nocturia more than twice per night   . S/P aortic valve replacement with bioprosthetic valve    06-19-2007  severe aortic valve stenosis  . S/P CABG x 1    06-19-2007  SVG to OM1  . Single vessel coronary artery disease   cardiologist-  dr Irish Lack   a. 05/2007 CABG x 1: s/p SVG-OM;  b. 10/2010 Ex MV: EF 72%, inf attenuation w/o ischemia, brief run of PAT with exercise. To  . Thrombocytopenia ([place])   . Thyroid goiter 2013   nodular  . Urgency of urination   . Wears hearing aid    BILATERAL  . Wears partial dentures    UPPER   Past Surgical History:  Procedure Laterality Date  . AORTIC VALVE REPLACEMENT (AVR)/CORONARY ARTERY BYPASS GRAFTING (CABG)  06-19-2007  dr [name]   SVG to OM1 ;   AVR w/ #23 Mitralflow pericardial and ligation left atrial appendage;  MV repair w/ #28 Sorin 3-D memo Ring Annuloplasty with repair ruptured chordar of P-2 flail segments  . CARDIAC CATHETERIZATION  05-12-2007  dr Leonia Reeves   single vessel 30% ostial LAD,  80% LCx;  severe to critial AS,  moderate MR,  normal LVF, ef 70%,  normal right heart pressures and cardiac outputs,  mild elevated LV end-diastolic pressure    . CARDIOVASCULAR STRESS TEST  11-02-2010  dr Irish Lack   normal nuclear study w/ no ischemia or infarct/scar/  normal LV function and wall motion , ef 72%  . CARDIOVERSION N/A 04/06/2014   Procedure: CARDIOVERSION;  Surgeon: [name], MD;  Location: [facility];  Service: Cardiovascular;  Laterality: N/A;   successful  . CATARACT EXTRACTION W/  INTRAOCULAR LENS  IMPLANT, BILATERAL  2012  approx  . RETROPUBIC RADICAL PROSTATECTOMY  06/ 2000  in [place], [place] Left 09/1998  . TEE WITHOUT CARDIOVERSION N/A 04/06/2014   Procedure: TRANSESOPHAGEAL ECHOCARDIOGRAM (TEE);  Surgeon: [name], MD;  Location: [facility] ENDOSCOPY;  Service: Cardiovascular;  Laterality: N/A;  mild focal basa LVH of the septum, ef 50-55%, s/p AV annuloplasty ring, elongated chordae, thickened leaflets, mild to mod. MR, multiple small jets/arotic bioprosthetic valve sits well in position, mild AI/ thickened TV w/ mild reurg./mild LA  . TRANSTHORACIC ECHOCARDIOGRAM  03-23-2016   dr Irish Lack   EF 55-60%, reduced contribution atrial contraction to ventricular filling, due to increased ventricular diastolic pressure or atrial contratile dysfunction/  bioprosthetic AV w/ mod. regurg. (valve area 1.97cm^2)/ mild dilated ascending aorta/ mild thicken MV normal function annular ring prosthesis/severe LAE & mod. to sev.RAE/ PASP 55mmHg/ mild TR/ ventricle septal motion show paradox  . TRANSURETHRAL RESECTION OF BLADDER TUMOR N/A 02/13/2016   Procedure: TRANSURETHRAL RESECTION OF BLADDER TUMOR (TURBT);  Surgeon: Nickie Retort, MD;  Location: [facility];  Service: Urology;  Laterality: N/A;  . TRANSURETHRAL RESECTION OF BLADDER TUMOR N/A 09/04/2016   Procedure: TRANSURETHRAL RESECTION OF BLADDER TUMOR (TURBT);  Surgeon: Nickie Retort, MD;  Location[facility];  Service: Urology;  Laterality: N/A;     Current Meds  Medication Sig  . apixaban (ELIQUIS) 5 MG TABS tablet Take 1 tablet (5 mg total) by mouth 2 (two) times daily.  Marland Kitchen atorvastatin (LIPITOR) 40 MG tablet TAKE 1 TABLET EVERY EVENING  . cefdinir (OMNICEF) 300 MG capsule 300 mg 2 (two) times daily.   Marland Kitchen ezetimibe (ZETIA) 10 MG tablet Take 1 tablet (10 mg total) by mouth every evening.  . furosemide (LASIX) 20 MG tablet Take 2 tablets (40 mg total) by mouth daily.   . metoprolol tartrate (LOPRESSOR) 25 MG tablet Take 0.5 tablets (12.5 mg total) by mouth 2 (two) times daily.     Allergies:   Patient has no known allergies.   Social History   Tobacco Use  . Smoking status: Never Smoker  . Smokeless tobacco: Never Used  Substance Use Topics  . Alcohol use: Yes    Alcohol/week: 1.0 - 2.0 standard drinks    Types: 1 - 2 Standard drinks or equivalent per week  . Drug use: No     Family Hx: The patient's family history includes Leukemia in his mother. There is no history of Heart attack, Stroke, or Hypertension.  ROS:   Please see the history of present illness.    Recent bleeding issues All other systems reviewed and are negative.   Labs/Other Tests and Data Reviewed:    Recent Labs: 06/19/2018: BUN 23; Creatinine, Ser 1.20; Potassium 4.3; Sodium 143   Recent Lipid Panel No results found for: CHOL, TRIG, HDL, CHOLHDL, LDLCALC, LDLDIRECT  Wt Readings from Last 3 Encounters:  08/14/18 185 lb (83.9 kg)  06/19/18 191 lb 12.8 oz (87 kg)  04/23/18 193 lb 12.8 oz (87.9 kg)     Exam:    Vital Signs:  BP (!) 149/51   Pulse 73   Ht 6' (1.829 m)   Wt 185 lb (83.9 kg)   BMI 25.09 kg/m    Well nourished, well developed male in no  acute distress. Extensive head dressing for ear wound.   Physical exam limited due to video conferencing.  ASSESSMENT & PLAN:    1.  S/pAVR: moderate AI in [place].  Question whether there could be some perivalvular leak and hemolysis.  He would benefit from TEE.  Will consider scheduling this once he is recovered from his skin surgery.  WIll have to discuss with imager whether COVID precautions would also interfere with TEE scheduling.  Wants to go back to using recumbent bike.   2. CAD: No angina.  CONtinue aggressive secondary prevention.   3. Chronic diastolic heart failure: Sx show that he is euvolemic.   4.  Anticoagulated: Restarting Eliquis today.  Feeling better.  Bleeding has stopped.  I explained  that he can hold Eliquis for 3-4 days at low risk in general for procedures.  He was nervous being of of the anticoagulant.    5. AFib: Rate controlled.  Eliquis for stroke prevention.  Risks and benefits of TEE discussed. Will plan for this to look for perivalvular leak as a source fo hemolysis.   COVID-19 Education: The signs and symptoms of COVID-19 were discussed with the patient and how to seek care for testing (follow up with PCP or arrange E-visit).  The importance of social distancing was discussed today.  Patient Risk:   After full review of this patients clinical status, I feel that they are at least moderate risk at this time.  Time:   Today, I have spent 20 minutes with the patient with telehealth technology discussing AVR and hemoysis.  .     Medication Adjustments/Labs and Tests Ordered: Current medicines are reviewed at length with the patient today.  Concerns regarding medicines are outlined above.  Tests Ordered: No orders of the defined types were placed in this encounter.  Medication Changes: No orders of the defined types were placed in this encounter.   Disposition:  in 3 month(s)  Signed, [name], MD  08/14/2018 11:52 AM    [facility]

## 2018-08-13 NOTE — Telephone Encounter (Signed)
Left message for patient to call back to see if we can arrange EVisit.

## 2018-08-13 NOTE — Telephone Encounter (Signed)
TELEPHONE CALL NOTE  Eric [name] has been deemed a candidate for a follow-up tele-health visit to limit community exposure during the Covid-19 pandemic. I spoke with the patient via phone to ensure availability of phone/video source, confirm preferred email & phone number, discuss instructions and expectations, and review consent.   I reminded Eric [name] to be prepared with any vital sign and/or heart rhythm information that could potentially be obtained via home monitoring, at the time of his visit.  Finally, I reminded [name] to expect an e-mail containing a link for their video-based visit approximately 15 minutes before his visit, or alternatively, a phone call at the time of his visit if his visit is planned to be a phone encounter.  Did the patient verbally consent to treatment as below? Yes  [name], RN 08/13/2018 5:44 PM  DOWNLOADING THE SOFTWARE (If applicable)  Download the [employer] app to enable video and telephone visits with your [facility] Provider.   Instructions for downloading [employer] WebEx: - Go to https://www.webex.com/downloads.html and follow the instructions - If you have technical difficulties with downloading WebEx, please call WebEx at [phone]. - Once the app is downloaded (can be done on either mobile or desktop computer), go to Settings in the upper left hand corner.  Be sure that camera and audio are enabled.  - You will receive an email message with a link to the meeting with a time to join for your tele-health visit.  - Please download the app and have settings configured prior to the appointment time.    CONSENT FOR TELE-HEALTH VISIT - PLEASE REVIEW  I hereby voluntarily request, consent and authorize [facility] and its employed or contracted physicians, physician assistants, nurse practitioners or other licensed health care professionals (the Practitioner), to provide me with telemedicine health care services (the  "Services") as deemed necessary by the treating Practitioner. I acknowledge and consent to receive the Services by the Practitioner via telemedicine. I understand that the telemedicine visit will involve communicating with the Practitioner through live audiovisual communication technology and the disclosure of certain medical information by electronic transmission. I acknowledge that I have been given the opportunity to request an in-person assessment or other available alternative prior to the telemedicine visit and am voluntarily participating in the telemedicine visit.  I understand that I have the right to withhold or withdraw my consent to the use of telemedicine in the course of my care at any time, without affecting my right to future care or treatment, and that the Practitioner or I may terminate the telemedicine visit at any time. I understand that I have the right to inspect all information obtained and/or recorded in the course of the telemedicine visit and may receive copies of available information for a reasonable fee.  I understand that some of the potential risks of receiving the Services via telemedicine include:  Marland Kitchen Delay or interruption in medical evaluation due to technological equipment failure or disruption; . Information transmitted may not be sufficient (e.g. poor resolution of images) to allow for appropriate medical decision making by the Practitioner; and/or  . In rare instances, security protocols could fail, causing a breach of personal health information.  Furthermore, I acknowledge that it is my responsibility to provide information about my medical history, conditions and care that is complete and accurate to the best of my ability. I acknowledge that Practitioner's advice, recommendations, and/or decision may be based on factors not within their control, such as  incomplete or inaccurate data provided by me or distortions of diagnostic images or specimens that may result from  electronic transmissions. I understand that the practice of medicine is not an exact science and that Practitioner makes no warranties or guarantees regarding treatment outcomes. I acknowledge that I will receive a copy of this consent concurrently upon execution via email to the email address I last provided but may also request a printed copy by calling the office of [place].    I understand that my insurance will be billed for this visit.   I have read or had this consent read to me. . I understand the contents of this consent, which adequately explains the benefits and risks of the Services being provided via telemedicine.  . I have been provided ample opportunity to ask questions regarding this consent and the Services and have had my questions answered to my satisfaction. . I give my informed consent for the services to be provided through the use of telemedicine in my medical care  By participating in this telemedicine visit I agree to the above.

## 2018-08-13 NOTE — Telephone Encounter (Signed)
Patient was scheduled for VIDEO visit with Dr. Irish Lack on 3/26

## 2018-08-14 ENCOUNTER — Other Ambulatory Visit: Payer: Self-pay

## 2018-08-14 ENCOUNTER — Ambulatory Visit: Payer: Medicare Other | Admitting: Interventional Cardiology

## 2018-08-14 ENCOUNTER — Encounter: Payer: Self-pay | Admitting: Interventional Cardiology

## 2018-08-14 ENCOUNTER — Ambulatory Visit (INDEPENDENT_AMBULATORY_CARE_PROVIDER_SITE_OTHER): Payer: Medicare Other | Admitting: Interventional Cardiology

## 2018-08-14 VITALS — BP 149/51 | HR 73 | Ht 72.0 in | Wt 185.0 lb

## 2018-08-14 DIAGNOSIS — Z952 Presence of prosthetic heart valve: Secondary | ICD-10-CM | POA: Diagnosis not present

## 2018-08-14 DIAGNOSIS — I5032 Chronic diastolic (congestive) heart failure: Secondary | ICD-10-CM

## 2018-08-14 DIAGNOSIS — Z7901 Long term (current) use of anticoagulants: Secondary | ICD-10-CM | POA: Diagnosis not present

## 2018-08-14 DIAGNOSIS — I2510 Atherosclerotic heart disease of native coronary artery without angina pectoris: Secondary | ICD-10-CM

## 2018-08-14 MED ORDER — METOPROLOL TARTRATE 25 MG PO TABS: 12.5000 mg | ORAL_TABLET | Freq: Two times a day (BID) | ORAL | 3 refills | Status: DC

## 2018-08-14 NOTE — Patient Instructions (Addendum)
Medication Instructions:  Your physician recommends that you continue on your current medications as directed. Please refer to the Current Medication list given to you today.  If you need a refill on your cardiac medications before your next appointment, please call your pharmacy.   Lab work: None Ordered  If you have labs (blood work) drawn today and your tests are completely normal, you will receive your results only by: Marland Kitchen MyChart Message (if you have MyChart) OR . A paper copy in the mail If you have any lab test that is abnormal or we need to change your treatment, we will call you to review the results.  Testing/Procedures: None ordered  Follow-Up: . You are scheduled for a Telehealth VIDEO visit with Dr. Irish Lack on 11/17/18 at 9:00 AM  Any Other Special Instructions Will Be Listed Below (If Applicable).

## 2018-08-18 ENCOUNTER — Other Ambulatory Visit: Payer: Self-pay | Admitting: Interventional Cardiology

## 2018-08-18 MED ORDER — METOPROLOL TARTRATE 25 MG PO TABS: 12.5000 mg | ORAL_TABLET | Freq: Two times a day (BID) | ORAL | 3 refills | Status: DC

## 2018-08-18 MED ORDER — METOPROLOL TARTRATE 25 MG PO TABS: 12.5000 mg | ORAL_TABLET | Freq: Two times a day (BID) | ORAL | 0 refills | Status: DC

## 2018-08-18 NOTE — Telephone Encounter (Signed)
Pt's medication was sent to pt's pharmacy as requested. Confirmation received.  °

## 2018-08-19 ENCOUNTER — Other Ambulatory Visit: Payer: Self-pay | Admitting: Interventional Cardiology

## 2018-08-19 NOTE — Telephone Encounter (Signed)
Pt last saw Dr Irish Lack 08/14/18, last labs 06/19/18 Creat 1.20, age 85, weight 83.9kg, based on specified criteria pt is on appropriate dosage of Eliquis 5mg  BID.  Will refill rx.

## 2018-08-22 ENCOUNTER — Ambulatory Visit: Payer: Medicare Other | Admitting: Interventional Cardiology

## 2018-09-04 ENCOUNTER — Other Ambulatory Visit: Payer: Self-pay | Admitting: Interventional Cardiology

## 2018-09-04 MED ORDER — METOPROLOL TARTRATE 25 MG PO TABS: 12.5000 mg | ORAL_TABLET | Freq: Two times a day (BID) | ORAL | 3 refills | Status: DC

## 2018-09-30 ENCOUNTER — Telehealth: Payer: Self-pay | Admitting: Interventional Cardiology

## 2018-09-30 NOTE — Telephone Encounter (Signed)
Returned call to patient. Patient reporting that he had labs done with [facility] recently (in [place]) and wanted Dr. Irish Lack to review those. The scar on the patient's ear from his surgery has almost completely healed. He denies any S/Sx of bleeding. Patient asking about the timing of his TEE. Patient's Sx have not changed since prior visit. Patient has been following stay at home orders and has been home in [place] since March. All screening questions for COVID are negative. Made patient aware that I will forward to Dr. Irish Lack for review.

## 2018-09-30 NOTE — Telephone Encounter (Signed)
New Message:    Patient calling concerning some labs from [place] and would like to report it to the nurse. Please call patient.

## 2018-10-03 ENCOUNTER — Telehealth: Payer: Self-pay | Admitting: Interventional Cardiology

## 2018-10-03 NOTE — Telephone Encounter (Signed)
OK to go ahead and schedule TEE to eval aortic valve.

## 2018-10-08 NOTE — Telephone Encounter (Signed)
Called and spoke to patient and his wife. Made them aware that Dr. Irish Lack was okay with going ahead and scheduling TEE. TEE scheduled on 10/22/18 with Dr. Marlou Porch. Patient with recent labs in [place]. Patient made aware that if additional labs are needed they will be drawn that morning. Pre-Procedure Covid Testing scheduled for 10/17/18. Reviewed pre-procedure instructions below with the patient who verbalized understanding. All screening questions for COVID are negative.    TEE Instructions: You are scheduled for a TEE on 10/22/18 with Dr. Marlou Porch.  Please arrive at the [facility] (Main Entrance A) at [facility]: [address] [place], [place] [address] at 7:30 AM. (1 1/2 hours prior to procedure)  DIET: Nothing to eat or drink after midnight except a sip of water with medications (see medication instructions below)  Medication Instructions: Hold furosemide (lasix) the morning of the procedure.  You may take all of your other regular morning medicines, not listed above.   Labs: Recent Blood work in [place] COVID-19 Testing will be done on 10/17/18 at 2:00 PM at [facility] at [address] [place], [place] [address] After your swab you will be given a mask to wear and instructed to go home and quarantine/no visitors until after your procedure. If you test positive you will be notified and your procedure will be cancelled.    You must have a responsible person to drive you home. Failure to do so could result in cancellation.  Bring your insurance cards.  *Special Note: Every effort is made to have your procedure done on time. Occasionally there are emergencies that occur at the hospital that may cause delays. Please be patient if a delay does occur.

## 2018-10-08 NOTE — Telephone Encounter (Signed)
Patient aware. TEE scheduled for 10/22/18 to evaluate aortic valve.

## 2018-10-10 NOTE — Telephone Encounter (Signed)
Eric [name], He has an AVR, now with moderate AI, heart failure and some labs c/w hemolysis.  Wondering if his valve could be causing all of this. THanks. JV

## 2018-10-17 ENCOUNTER — Other Ambulatory Visit (HOSPITAL_COMMUNITY)
Admission: RE | Admit: 2018-10-17 | Discharge: 2018-10-17 | Disposition: A | Discharge status: Home / Self Care | Payer: Medicare Other | Source: Ambulatory Visit | Attending: Cardiology | Admitting: Cardiology

## 2018-10-17 DIAGNOSIS — Z1159 Encounter for screening for other viral diseases: Secondary | ICD-10-CM | POA: Diagnosis present

## 2018-10-18 LAB — NOVEL CORONAVIRUS, NAA (HOSP ORDER, SEND-OUT TO REF LAB; TAT 18-24 HRS): SARS-CoV-2, NAA: NOT DETECTED

## 2018-10-21 ENCOUNTER — Other Ambulatory Visit: Payer: Self-pay | Admitting: Interventional Cardiology

## 2018-10-21 DIAGNOSIS — I351 Nonrheumatic aortic (valve) insufficiency: Secondary | ICD-10-CM

## 2018-10-22 ENCOUNTER — Ambulatory Visit (HOSPITAL_COMMUNITY)
Admission: RE | Admit: 2018-10-22 | Discharge: 2018-10-22 | Disposition: A | Discharge status: Home / Self Care | Payer: Medicare Other | Attending: Cardiology | Admitting: Cardiology

## 2018-10-22 ENCOUNTER — Ambulatory Visit (HOSPITAL_BASED_OUTPATIENT_CLINIC_OR_DEPARTMENT_OTHER)
Admission: RE | Admit: 2018-10-22 | Discharge: 2018-10-22 | Disposition: A | Discharge status: Home / Self Care | Payer: Medicare Other | Source: Ambulatory Visit | Attending: Interventional Cardiology | Admitting: Interventional Cardiology

## 2018-10-22 ENCOUNTER — Encounter (HOSPITAL_COMMUNITY): Payer: Self-pay

## 2018-10-22 ENCOUNTER — Encounter (HOSPITAL_COMMUNITY)
Admission: RE | Disposition: A | Discharge status: Home / Self Care | Payer: Self-pay | Source: Home / Self Care | Attending: Cardiology

## 2018-10-22 ENCOUNTER — Telehealth: Payer: Self-pay | Admitting: Interventional Cardiology

## 2018-10-22 DIAGNOSIS — Z7901 Long term (current) use of anticoagulants: Secondary | ICD-10-CM | POA: Insufficient documentation

## 2018-10-22 DIAGNOSIS — E785 Hyperlipidemia, unspecified: Secondary | ICD-10-CM | POA: Insufficient documentation

## 2018-10-22 DIAGNOSIS — I4891 Unspecified atrial fibrillation: Secondary | ICD-10-CM | POA: Insufficient documentation

## 2018-10-22 DIAGNOSIS — Z951 Presence of aortocoronary bypass graft: Secondary | ICD-10-CM | POA: Diagnosis not present

## 2018-10-22 DIAGNOSIS — Z953 Presence of xenogenic heart valve: Secondary | ICD-10-CM | POA: Diagnosis not present

## 2018-10-22 DIAGNOSIS — Z1159 Encounter for screening for other viral diseases: Secondary | ICD-10-CM | POA: Insufficient documentation

## 2018-10-22 DIAGNOSIS — I2510 Atherosclerotic heart disease of native coronary artery without angina pectoris: Secondary | ICD-10-CM | POA: Diagnosis not present

## 2018-10-22 DIAGNOSIS — I5032 Chronic diastolic (congestive) heart failure: Secondary | ICD-10-CM | POA: Insufficient documentation

## 2018-10-22 DIAGNOSIS — Z85820 Personal history of malignant melanoma of skin: Secondary | ICD-10-CM | POA: Insufficient documentation

## 2018-10-22 DIAGNOSIS — I361 Nonrheumatic tricuspid (valve) insufficiency: Secondary | ICD-10-CM

## 2018-10-22 DIAGNOSIS — Z86711 Personal history of pulmonary embolism: Secondary | ICD-10-CM | POA: Insufficient documentation

## 2018-10-22 DIAGNOSIS — Z8546 Personal history of malignant neoplasm of prostate: Secondary | ICD-10-CM | POA: Insufficient documentation

## 2018-10-22 DIAGNOSIS — Z86718 Personal history of other venous thrombosis and embolism: Secondary | ICD-10-CM | POA: Insufficient documentation

## 2018-10-22 DIAGNOSIS — I351 Nonrheumatic aortic (valve) insufficiency: Secondary | ICD-10-CM

## 2018-10-22 DIAGNOSIS — Z79899 Other long term (current) drug therapy: Secondary | ICD-10-CM | POA: Insufficient documentation

## 2018-10-22 DIAGNOSIS — I4892 Unspecified atrial flutter: Secondary | ICD-10-CM | POA: Insufficient documentation

## 2018-10-22 DIAGNOSIS — Z8551 Personal history of malignant neoplasm of bladder: Secondary | ICD-10-CM | POA: Diagnosis not present

## 2018-10-22 HISTORY — PX: TEE WITHOUT CARDIOVERSION: SHX5443

## 2018-10-22 SURGERY — ECHOCARDIOGRAM, TRANSESOPHAGEAL
Anesthesia: Moderate Sedation

## 2018-10-22 MED ORDER — MIDAZOLAM HCL (PF) 5 MG/ML IJ SOLN
INTRAMUSCULAR | Status: AC
  Filled 2018-10-22: qty 2

## 2018-10-22 MED ORDER — BUTAMBEN-TETRACAINE-BENZOCAINE 2-2-14 % EX AERO
INHALATION_SPRAY | CUTANEOUS | Status: DC | PRN
  Administered 2018-10-22: 09:00:00 2 via TOPICAL

## 2018-10-22 MED ORDER — SODIUM CHLORIDE 0.9 % IV SOLN
INTRAVENOUS | Status: DC
  Administered 2018-10-22: 09:00:00 via INTRAVENOUS

## 2018-10-22 MED ORDER — MIDAZOLAM HCL (PF) 10 MG/2ML IJ SOLN
INTRAMUSCULAR | Status: DC | PRN
  Administered 2018-10-22 (×2): 2 mg via INTRAVENOUS

## 2018-10-22 MED ORDER — FENTANYL CITRATE (PF) 100 MCG/2ML IJ SOLN
INTRAMUSCULAR | Status: DC | PRN
  Administered 2018-10-22 (×2): 25 ug via INTRAVENOUS

## 2018-10-22 MED ORDER — DIPHENHYDRAMINE HCL 50 MG/ML IJ SOLN
INTRAMUSCULAR | Status: AC
  Filled 2018-10-22: qty 1

## 2018-10-22 MED ORDER — FENTANYL CITRATE (PF) 100 MCG/2ML IJ SOLN
INTRAMUSCULAR | Status: AC
  Filled 2018-10-22: qty 2

## 2018-10-22 NOTE — Telephone Encounter (Signed)
Patient and his wife calling after his TEE that was done today to determine plan of care. Made patient aware that Dr. Irish Lack will review results and determine if f/u will be arranged with Dr. Jeronimo Greaves. [name] to discuss TAVR vs SAVR. Made patient aware that he will likely need another heart catheterization since it has been several years since this was done as well as other additional work-up testing. Made patient aware that they will be contacted in the near future to arrange appropriate follow-up/testing. They verbalized understanding and thanked me for the call.

## 2018-10-22 NOTE — Telephone Encounter (Signed)
Eric [name], Can you look at this patient's TEE.  He had an AVR many years ago and now has severe AI. He has had CHF sx and mild hemolysis based on LFTs.  Not sure if he is a candidate for a valve in valve procedure.   He is an active 85 y/o.  Was traveling to [place] once a month prior to [place] 19 outbreak limiting his travel.

## 2018-10-22 NOTE — Telephone Encounter (Signed)
° °  Spouse calling to request call from nurse to discuss follow up from TEE today / plan of care .

## 2018-10-22 NOTE — CV Procedure (Signed)
   Transesophageal Echocardiogram  Indications: Evaluation of bioprosthetic AV  Time out performed  During this procedure the patient is administered a total of Versed 4 mg and Fentanyl 50 mcg to achieve and maintain moderate conscious sedation.  The patient's heart rate, blood pressure, and oxygen saturation are monitored continuously during the procedure. The period of conscious sedation is 25 minutes, of which I was present face-to-face 100% of this time.  Findings:  Left Ventricle: Normal EF  Mitral Valve: Repair. Mild anterior leaflet restriction. Mean gradient 22mmHg (low). Mild mitral regurgitation  Aortic Valve: Bioprosthetic 2009. Severe intravalvular regurgitation. Transaortic gradient 15mmHg (mild). Pressure half time 317ms.  Tricuspid Valve: Moderate to severe regurgitation. Myxomatous, prolapsing TV. Transtricuspid gradient 67mmHg.   Left Atrium: Dilated. LAA open, no thrombus  Bubble Contrast Study: not performed.   Discussed with Dr. Irish Lack. Will have consultation with Dr. [name] and valve team. Question if candidate for TAVR?  Candee Furbish, MD

## 2018-10-22 NOTE — H&P (Signed)
PCP:  [name], MD           Cardiologist:  [name], MD  Electrophysiologist:  None   Chief Complaint:  Atrial fibrillation  History of Present Illness:    Eric [name] is a 85 y.o. male who presents via audio/video conferencing for a telehealth visit today.    He has had an aortic valve replacement and single vessel bypass in 2009. Hehad a negative stress test in 2011. No chest pain prior to valve replacement surgery. He did have SHOB.   In late 2015, his heart rate was noted to be elevated at the time of a tooth extraction. He was found to be in atrial flutter. He underwent successful cardioversion. He notes that during the time he was out of rhythm, he had a cough. This got better after the cardioversion.   He went to [place] in February 2016 for about 3 weeks and did well on the NOAC. Less bleeding and bruising on Eliquis compared to his experience Coumadin. He then changed from Coumadin to Eliquisto treat multiple DVT and PE.  Hehad atooth extraction. Whenhe stayed on his anticoagulation, he had significant bleeding issues at that time.THe next time,he cameoff of the Eliquis.   Inlate 2017, he had several instances of blood clots in his urine. He underwent cystoscopy in the urology clinic after holding out was for 4 days. Thisshowed a malignancy. He had surgery and tolerated this well.  He has noticed more edema in his right leg since surgery back in 2017.It persists.  In late 2019, he was in [place] and was volume overloaded and short of breath. He was given IV Lasix and diuresed well. He was kept overnight. He was also found to be in AFib. He dd not feel Palpitations.   They were eating out all of the time. He had stopped his Lasix with his bladder cancer. Echo showed normal LV function and 3+ AI.  He had been doing well. A few days ago, he had a procedure on the left ear. It was bleeding and it was cauterized. 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    I spoke to his PMD, Dr. Laurann Montana.  His labs revealed concern for low grade hemolysis.  TEE was considered but the patient was in [place], and given the COVID outbreak, the procedure was postponed.    He required repeat surgical procedure on the squamous cell area.  He had some bleeding post procedure and required more stitches.  He required a skin graft.    The patient does not symptoms concerning for COVID-19 infection (fever, chills, cough, or new SHORTNESS OF BREATH).    Prior CV studies:   The following studies were reviewed today:  Cr. 1.2 in Jan 2020      Past Medical History:  Diagnosis Date  . Atrial flutter, paroxysmal ([place])    a. dx 04-02-2014, s/p  successful cardioversion 04-06-2014.  . Bilateral lower extremity edema    CHRONIC  . Bladder tumor   . Chronic anticoagulation    on Eliquis--  due to recurrent dvt's and pe's  . Dyslipidemia   . Frequency of urination   . History of basal cell carcinoma excision    06/ 2012  left upper back;   right forehead 07/ 2014  . History of bladder cancer urologist-  dr [name]   02-13-2016  s/p TURBT per path high grade papillary urothelial carcinoma  . History of DVT (deep vein thrombosis)    2000-- RLE  .  History of melanoma excision    left flank;  07/ 2014 left lower leg and right upper chest  . History of prostate cancer    Gleason 6--  s/p  radical prostatectomy 06/ 2000 in [place]---  no recurrence  . History of pulmonary embolus (PE)    2000 & 2005  bilateral  . Hx of valvuloplasty 06/19/2007   a. s/p mitral ring annuloplasty, repair ruptured chordae of P2 flail segments of MV  . Nocturia more than twice per night   . S/P aortic valve replacement with bioprosthetic valve    06-19-2007  severe aortic valve stenosis  . S/P CABG x 1    06-19-2007  SVG to OM1  . Single vessel coronary artery disease   cardiologist-  dr Irish Lack   a. 05/2007 CABG x 1: s/p SVG-OM;   b. 10/2010 Ex MV: EF 72%, inf attenuation w/o ischemia, brief run of PAT with exercise. To  . Thrombocytopenia ([place])   . Thyroid goiter 2013   nodular  . Urgency of urination   . Wears hearing aid    BILATERAL  . Wears partial dentures    UPPER        Past Surgical History:  Procedure Laterality Date  . AORTIC VALVE REPLACEMENT (AVR)/CORONARY ARTERY BYPASS GRAFTING (CABG)  06-19-2007  dr [name]   SVG to OM1 ;   AVR w/ #23 Mitralflow pericardial and ligation left atrial appendage;  MV repair w/ #28 Sorin 3-D memo Ring Annuloplasty with repair ruptured chordar of P-2 flail segments  . CARDIAC CATHETERIZATION  05-12-2007  dr Leonia Reeves   single vessel 30% ostial LAD,  80% LCx;  severe to critial AS,  moderate MR,  normal LVF, ef 70%,  normal right heart pressures and cardiac outputs,  mild elevated LV end-diastolic pressure    . CARDIOVASCULAR STRESS TEST  11-02-2010  dr Irish Lack   normal nuclear study w/ no ischemia or infarct/scar/  normal LV function and wall motion , ef 72%  . CARDIOVERSION N/A 04/06/2014   Procedure: CARDIOVERSION;  Surgeon: [name], MD;  Location: [facility];  Service: Cardiovascular;  Laterality: N/A;   successful  . CATARACT EXTRACTION W/ INTRAOCULAR LENS  IMPLANT, BILATERAL  2012  approx  . RETROPUBIC RADICAL PROSTATECTOMY  06/ 2000  in [place] Left 09/1998  . TEE WITHOUT CARDIOVERSION N/A 04/06/2014   Procedure: TRANSESOPHAGEAL ECHOCARDIOGRAM (TEE);  Surgeon: [name], MD;  Location: [facility];  Service: Cardiovascular;  Laterality: N/A;  mild focal basa LVH of the septum, ef 50-55%, s/p AV annuloplasty ring, elongated chordae, thickened leaflets, mild to mod. MR, multiple small jets/arotic bioprosthetic valve sits well in position, mild AI/ thickened TV w/ mild reurg./mild LA  . TRANSTHORACIC ECHOCARDIOGRAM  03-23-2016   dr Irish Lack   EF 55-60%, reduced contribution atrial contraction to ventricular  filling, due to increased ventricular diastolic pressure or atrial contratile dysfunction/  bioprosthetic AV w/ mod. regurg. (valve area 1.97cm^2)/ mild dilated ascending aorta/ mild thicken MV normal function annular ring prosthesis/severe LAE & mod. to sev.RAE/ PASP 5mmHg/ mild TR/ ventricle septal motion show paradox  . TRANSURETHRAL RESECTION OF BLADDER TUMOR N/A 02/13/2016   Procedure: TRANSURETHRAL RESECTION OF BLADDER TUMOR (TURBT);  Surgeon: Nickie Retort, MD;  Location: [facility];  Service: Urology;  Laterality: N/A;  . TRANSURETHRAL RESECTION OF BLADDER TUMOR N/A 09/04/2016   Procedure: TRANSURETHRAL RESECTION OF BLADDER TUMOR (TURBT);  Surgeon: Nickie Retort, MD;  Location:  [facility];  Service: Urology;  Laterality: N/A;     Active Medications      Current Meds  Medication Sig  . apixaban (ELIQUIS) 5 MG TABS tablet Take 1 tablet (5 mg total) by mouth 2 (two) times daily.  Marland Kitchen atorvastatin (LIPITOR) 40 MG tablet TAKE 1 TABLET EVERY EVENING  . cefdinir (OMNICEF) 300 MG capsule 300 mg 2 (two) times daily.   Marland Kitchen ezetimibe (ZETIA) 10 MG tablet Take 1 tablet (10 mg total) by mouth every evening.  . furosemide (LASIX) 20 MG tablet Take 2 tablets (40 mg total) by mouth daily.  . metoprolol tartrate (LOPRESSOR) 25 MG tablet Take 0.5 tablets (12.5 mg total) by mouth 2 (two) times daily.       Allergies:   Patient has no known allergies.   Social History        Tobacco Use  . Smoking status: Never Smoker  . Smokeless tobacco: Never Used  Substance Use Topics  . Alcohol use: Yes    Alcohol/week: 1.0 - 2.0 standard drinks    Types: 1 - 2 Standard drinks or equivalent per week  . Drug use: No     Family Hx: The patient's family history includes Leukemia in his mother. There is no history of Heart attack, Stroke, or Hypertension.  ROS:   Please see the history of present illness.    Recent bleeding issues All other systems  reviewed and are negative.   Labs/Other Tests and Data Reviewed:    Recent Labs: 06/19/2018: BUN 23; Creatinine, Ser 1.20; Potassium 4.3; Sodium 143   Recent Lipid Panel Labs (Brief)  No results found for: CHOL, TRIG, HDL, CHOLHDL, LDLCALC, LDLDIRECT       Wt Readings from Last 3 Encounters:  08/14/18 185 lb (83.9 kg)  06/19/18 191 lb 12.8 oz (87 kg)  04/23/18 193 lb 12.8 oz (87.9 kg)     Exam:    Vital Signs:  BP (!) 149/51   Pulse 73   Ht 6' (1.829 m)   Wt 185 lb (83.9 kg)   BMI 25.09 kg/m    Well nourished, well developed male in no  acute distress. Extensive head dressing for ear wound.   Physical exam limited due to video conferencing.  ASSESSMENT & PLAN:    1.  S/pAVR: moderate AI in [place].  Question whether there could be some perivalvular leak and hemolysis.  He would benefit from TEE.  Will consider scheduling this once he is recovered from his skin surgery.  WIll have to discuss with imager whether COVID precautions would also interfere with TEE scheduling.  Wants to go back to using recumbent bike.   2. CAD: No angina.  CONtinue aggressive secondary prevention.   3. Chronic diastolic heart failure: Sx show that he is euvolemic.   4.  Anticoagulated: Restarting Eliquis today.  Feeling better.  Bleeding has stopped.  I explained that he can hold Eliquis for 3-4 days at low risk in general for procedures.  He was nervous being of of the anticoagulant.    5. AFib: Rate controlled.  Eliquis for stroke prevention.  Risks and benefits of TEE discussed. Will plan for this to look for perivalvular leak as a source fo hemolysis.   COVID-19 Education: The signs and symptoms of COVID-19 were discussed with the patient and how to seek care for testing (follow up with PCP or arrange E-visit).  The importance of social distancing was discussed today.  Patient Risk:   After  full review of this patients clinical status, I feel that they are at least  moderate risk at this time.  Time:   Today, I have spent 20 minutes with the patient with telehealth technology discussing AVR and hemoysis.  .     Medication Adjustments/Labs and Tests Ordered: Current medicines are reviewed at length with the patient today.  Concerns regarding medicines are outlined above.  Tests Ordered: No orders of the defined types were placed in this encounter.  Medication Changes: No orders of the defined types were placed in this encounter.   Disposition:  in 3 month(s)  Signed, [name], MD  08/14/2018 11:52 AM    Eric [name] seen and examined. Agree with above.   No changes to above. TEE to eval AV.   Candee Furbish, MD

## 2018-10-22 NOTE — Telephone Encounter (Signed)
Reviewed his op report from 2009. He has a 23 mm Mitroflow bioprosthesis. Treatable with valve-in-valve TAVR. We will set up a consult. He will need R/L heart cath and CTA studies. Will ask Lauren to make arrangements and set up a visit with Dr [name] or me afterwards. Do you want to do his cath Ulice Dash?

## 2018-10-22 NOTE — Telephone Encounter (Signed)
I can do his cath or if you want to do it and meet him then, that is fine with me.

## 2018-10-22 NOTE — Discharge Instructions (Signed)

## 2018-10-22 NOTE — Progress Notes (Signed)
  Echocardiogram Echocardiogram Transesophageal has been performed.  Eric [name] M 10/22/2018, 10:12 AM

## 2018-10-24 ENCOUNTER — Inpatient Hospital Stay (HOSPITAL_COMMUNITY): Admission: RE | Admit: 2018-10-24 | Payer: Medicare Other | Source: Ambulatory Visit

## 2018-10-24 ENCOUNTER — Other Ambulatory Visit: Payer: Self-pay

## 2018-10-24 ENCOUNTER — Other Ambulatory Visit (HOSPITAL_COMMUNITY)
Admission: RE | Admit: 2018-10-24 | Discharge: 2018-10-24 | Disposition: A | Discharge status: Home / Self Care | Payer: Medicare Other | Source: Ambulatory Visit | Attending: Cardiovascular Disease | Admitting: Cardiovascular Disease

## 2018-10-24 DIAGNOSIS — Z01812 Encounter for preprocedural laboratory examination: Secondary | ICD-10-CM | POA: Diagnosis present

## 2018-10-24 DIAGNOSIS — Z1159 Encounter for screening for other viral diseases: Secondary | ICD-10-CM | POA: Diagnosis not present

## 2018-10-25 LAB — NOVEL CORONAVIRUS, NAA (HOSP ORDER, SEND-OUT TO REF LAB; TAT 18-24 HRS): SARS-CoV-2, NAA: NOT DETECTED

## 2018-10-26 NOTE — Telephone Encounter (Signed)
Pt is scheduled for cardiac catheterization with Dr [name] on 10/29/2018.

## 2018-10-26 NOTE — Progress Notes (Signed)
Virtual Visit via Video Note   This visit type was conducted due to national recommendations for restrictions regarding the COVID-19 Pandemic (e.g. social distancing) in an effort to limit this patient's exposure and mitigate transmission in our community.  Due to his co-morbid illnesses, this patient is at least at moderate risk for complications without adequate follow up.  This format is felt to be most appropriate for this patient at this time.  All issues noted in this document were discussed and addressed.  A limited physical exam was performed with this format.  Please refer to the patient's chart for his consent to telehealth for [facility].   Patient unable to access camera.  Date:  10/27/2018   ID:  Eric [name], DOB 1929-10-19, MRN [id number]  Patient Location: Home Provider Location: Office  PCP:  Eric Orn, MD  Cardiologist:  Eric Grooms, MD  Electrophysiologist:  None   Evaluation Performed:  Follow-Up Visit  Chief Complaint:  AI  History of Present Illness:    Eric [name] is a 85 y.o. male with severe AI.   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  He has had an aortic valve replacement , MV repair and single vessel bypass in 2009. Hehad a negative stress test in 2011. No chest pain prior to valve replacement surgery. He did have SHOB.   In late 2015, his heart rate was noted to be elevated at the time of a tooth extraction. He was found to be in atrial flutter. He underwent successful cardioversion. He notes that during the time he was out of rhythm, he had a cough. This got better after the cardioversion.   He went to [place] in February 2016 for about 3 weeks and did well on the NOAC. Less bleeding and bruising on Eliquis compared to his experience Coumadin. He then changed from Coumadin to Eliquisto treat multiple DVT and PE.  Hehad atooth extraction. Whenhe stayed on his anticoagulation, he had significant bleeding  issues at that time.THe next time,he cameoff of the Eliquis.   Inlate 2017, he had several instances of blood clots in his urine. He underwent cystoscopy in the urology clinic after holding out was for 4 days. Thisshowed a malignancy. He had surgery and tolerated this well.  He has noticed more edema in his right leg since surgery back in 2017.It persists.  In late 2019, he was in [place] and was volume overloaded and short of breath. He was given IV Lasix and diuresed well. He was kept overnight. He was also found to be in AFib. He dd not feel Palpitations.   They were eating out all of the time. He had stopped his Lasix with his bladder cancer. Echo showed normal LV function and 3+ AI.  In early 2020,He had a procedure on the left ear. It was bleeding and it was cauterized. 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    I spoke to his PMD, Dr. Laurann [name] in 07/2018.  His labs revealed concern for low grade hemolysis.  TEE was considered but the patient was in [place], and given the COVID outbreak, the procedure was postponed.    He required repeat surgical procedure on the squamous cell area.  He had some bleeding post procedure and required more stitches.  He required a skin graft.    TEE was done in 09/2018:  The left ventricle has low normal systolic function, with an ejection fraction of 50-55%. Left ventricular diastolic function could not be evaluated. No  evidence of left ventricular regional wall motion abnormalities.  2. The right ventricle has normal systolc function. The cavity was normal. There is no increase in right ventricular wall thickness.  3. Left atrial size was mildly dilated.  4. Right atrial size was mildly dilated.  5. Moderately thickened tricuspid valve leaflets.  6. 25mm Sorin mitral valve repair annular ring intact.  7. 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  8. A 28 Mitralflow porcine bioprosthesis valve  is present in the aortic position. Echo findings are consistent with intravalvular regurgitation of the aortic prosthesis.  9. Aortic valve regurgitation is severe by color flow Doppler. Mild stenosis of the aortic valve. 10. No vegetation on the aortic valve. 11. The aortic root is normal in size and structure. 12. Previously ligated left atrial appendage. 13. Severe bioprosthetic aortic valve regurgitation.  D/w Dr. Burt [name] with plans for TAVR w/u.  The patient does not have symptoms concerning for COVID-19 infection (fever, chills, cough, or new shortness of breath).    Past Medical History:  Diagnosis Date  . Atrial flutter, paroxysmal ([place])    a. dx 04-02-2014, s/p  successful cardioversion 04-06-2014.  . Bilateral lower extremity edema    CHRONIC  . Bladder tumor   . Chronic anticoagulation    on Eliquis--  due to recurrent dvt's and pe's  . Dyslipidemia   . Frequency of urination   . History of basal cell carcinoma excision    06/ 2012  left upper back;   right forehead 07/ 2014  . History of bladder cancer urologist-  dr Eric [name]   02-13-2016  s/p TURBT per path high grade papillary urothelial carcinoma  . History of DVT (deep vein thrombosis)    2000-- RLE  . History of melanoma excision    left flank;  07/ 2014 left lower leg and right upper chest  . History of prostate cancer    Gleason 6--  s/p  radical prostatectomy 06/ 2000 in [place]---  no recurrence  . History of pulmonary embolus (PE)    2000 & 2005  bilateral  . Hx of valvuloplasty 06/19/2007   a. s/p mitral ring annuloplasty, repair ruptured chordae of P2 flail segments of MV  . Nocturia more than twice per night   . S/P aortic valve replacement with bioprosthetic valve    06-19-2007  severe aortic valve stenosis  . S/P CABG x 1    06-19-2007  SVG to OM1  . Single vessel coronary artery disease   cardiologist-  dr Eric [name]   a. 05/2007 CABG x 1: s/p SVG-OM;  b. 10/2010 Ex MV: EF 72%, inf attenuation w/o  ischemia, brief run of PAT with exercise. To  . Thrombocytopenia ([place])   . Thyroid goiter 2013   nodular  . Urgency of urination   . Wears hearing aid    BILATERAL  . Wears partial dentures    UPPER   Past Surgical History:  Procedure Laterality Date  . AORTIC VALVE REPLACEMENT (AVR)/CORONARY ARTERY BYPASS GRAFTING (CABG)  06-19-2007  dr [name]   SVG to OM1 ;   AVR w/ #23 Mitralflow pericardial and ligation left atrial appendage;  MV repair w/ #28 Sorin 3-D memo Ring Annuloplasty with repair ruptured chordar of P-2 flail segments  . CARDIAC CATHETERIZATION  05-12-2007  dr Leonia Reeves   single vessel 30% ostial LAD,  80% LCx;  severe to critial AS,  moderate MR,  normal LVF, ef 70%,  normal right heart pressures and  cardiac outputs,  mild elevated LV end-diastolic pressure    . CARDIOVASCULAR STRESS TEST  11-02-2010  dr Eric [name]   normal nuclear study w/ no ischemia or infarct/scar/  normal LV function and wall motion , ef 72%  . CARDIOVERSION N/A 04/06/2014   Procedure: CARDIOVERSION;  Surgeon: [name], MD;  Location: [facility];  Service: Cardiovascular;  Laterality: N/A;   successful  . CATARACT EXTRACTION W/ INTRAOCULAR LENS  IMPLANT, BILATERAL  2012  approx  . RETROPUBIC RADICAL PROSTATECTOMY  06/ 2000  in [place], [place] Left 09/1998  . TEE WITHOUT CARDIOVERSION N/A 04/06/2014   Procedure: TRANSESOPHAGEAL ECHOCARDIOGRAM (TEE);  Surgeon: [name], MD;  Location[facility] ENDOSCOPY;  Service: Cardiovascular;  Laterality: N/A;  mild focal basa LVH of the septum, ef 50-55%, s/p AV annuloplasty ring, elongated chordae, thickened leaflets, mild to mod. MR, multiple small jets/arotic bioprosthetic valve sits well in position, mild AI/ thickened TV w/ mild reurg./mild LA  . TEE WITHOUT CARDIOVERSION N/A 10/22/2018   Procedure: TRANSESOPHAGEAL ECHOCARDIOGRAM (TEE);  Surgeon: [name], MD;  Location[facility] ENDOSCOPY;  Service: Cardiovascular;  Laterality: N/A;  .  TRANSTHORACIC ECHOCARDIOGRAM  03-23-2016   dr Eric [name]   EF 55-60%, reduced contribution atrial contraction to ventricular filling, due to increased ventricular diastolic pressure or atrial contratile dysfunction/  bioprosthetic AV w/ mod. regurg. (valve area 1.97cm^2)/ mild dilated ascending aorta/ mild thicken MV normal function annular ring prosthesis/severe LAE & mod. to sev.RAE/ PASP 64mmHg/ mild TR/ ventricle septal motion show paradox  . TRANSURETHRAL RESECTION OF BLADDER TUMOR N/A 02/13/2016   Procedure: TRANSURETHRAL RESECTION OF BLADDER TUMOR (TURBT);  Surgeon: Nickie Retort, MD;  Location: [facility];  Service: Urology;  Laterality: N/A;  . TRANSURETHRAL RESECTION OF BLADDER TUMOR N/A 09/04/2016   Procedure: TRANSURETHRAL RESECTION OF BLADDER TUMOR (TURBT);  Surgeon: Nickie Retort, MD;  Location: [facility];  Service: Urology;  Laterality: N/A;     Current Meds  Medication Sig  . atorvastatin (LIPITOR) 40 MG tablet TAKE 1 TABLET EVERY EVENING  . ELIQUIS 5 MG TABS tablet TAKE 1 TABLET TWICE A DAY  . ezetimibe (ZETIA) 10 MG tablet Take 1 tablet (10 mg total) by mouth every evening.  . furosemide (LASIX) 20 MG tablet Take 2 tablets (40 mg total) by mouth daily.  . metoprolol tartrate (LOPRESSOR) 25 MG tablet Take 0.5 tablets (12.5 mg total) by mouth 2 (two) times daily.     Allergies:   Patient has no known allergies.   Social History   Tobacco Use  . Smoking status: Never Smoker  . Smokeless tobacco: Never Used  Substance Use Topics  . Alcohol use: Yes    Alcohol/week: 1.0 - 2.0 standard drinks    Types: 1 - 2 Standard drinks or equivalent per week  . Drug use: No     Family Hx: The patient's family history includes Leukemia in his mother. There is no history of Heart attack, Stroke, or Hypertension.  ROS:   Please see the history of present illness.    Sore throat post TEE.   All other systems reviewed and are negative.   Prior  CV studies:   The following studies were reviewed today:  TEE results as noted above  Labs/Other Tests and Data Reviewed:    EKG:  An ECG dated 12/19 was personally reviewed today and demonstrated:  AFib, rate controlled  Recent Labs: 06/19/2018: BUN 23; Creatinine, Ser 1.20; Potassium 4.3;  Sodium 143   Recent Lipid Panel No results found for: CHOL, TRIG, HDL, CHOLHDL, LDLCALC, LDLDIRECT  Wt Readings from Last 3 Encounters:  10/27/18 187 lb (84.8 kg)  10/22/18 187 lb (84.8 kg)  08/14/18 185 lb (83.9 kg)     Objective:    Vital Signs:  BP (!) 121/42   Pulse 65   Temp (!) 97.3 F (36.3 C)   Ht 6' (1.829 m)   Wt 187 lb (84.8 kg)   BMI 25.36 kg/m    VITAL SIGNS:  reviewed GEN:  no acute distress RESPIRATORY:  no shortness of breath PSYCH:  normal affect exam limited by phone format  ASSESSMENT & PLAN:    1. S/p AVR:  Severe AI.  Will need R and Left cath for possible TAVR.  We discussed his TEE results and plans for TAVR as well.  TR was noted.  2. CAD: No angina.  Single vessel CABG in 2009.   3. CHronic diastolic heart failure: Appears euvolemic with Lasix.   No leg swelling. 4. Anticoagulated: No bleeding issues. 5. AFib: Rate controlled.  Hold Eliquis 48 hours preprocedure.    Cardiac catheterization was discussed with the patient fully. The patient understands that risks include but are not limited to stroke (1 in 1000), death (1 in 99), kidney failure [usually temporary] (1 in 500), bleeding (1 in 200), allergic reaction [possibly serious] (1 in 200).  The patient understands and is willing to proceed.     COVID-19 Education: The signs and symptoms of COVID-19 were discussed with the patient and how to seek care for testing (follow up with PCP or arrange E-visit).  The importance of social distancing was discussed today.  Time:   Today, I have spent 25 minutes with the patient with telehealth technology discussing the above problems.     Medication  Adjustments/Labs and Tests Ordered: Current medicines are reviewed at length with the patient today.  Concerns regarding medicines are outlined above.   Tests Ordered: No orders of the defined types were placed in this encounter.   Medication Changes: No orders of the defined types were placed in this encounter.   Disposition:  Follow up in after valve evaluation with Dr. Burt [name]  Signed, Eric Grooms, MD  10/27/2018 10:26 AM    [place]

## 2018-10-26 NOTE — Telephone Encounter (Signed)
Either way fine with me. I have to do a formal consult regardless of whether I do his cath. I'm in the cath lab Wed and Demetrius Charity this week if that works for him otherwise feel free to do if you have lab days. thx Ulice Dash

## 2018-10-26 NOTE — H&P (View-Only) (Signed)
Virtual Visit via Video Note   This visit type was conducted due to national recommendations for restrictions regarding the COVID-19 Pandemic (e.g. social distancing) in an effort to limit this patient's exposure and mitigate transmission in our community.  Due to his co-morbid illnesses, this patient is at least at moderate risk for complications without adequate follow up.  This format is felt to be most appropriate for this patient at this time.  All issues noted in this document were discussed and addressed.  A limited physical exam was performed with this format.  Please refer to the patient's chart for his consent to telehealth for [facility].   Patient unable to access camera.  Date:  10/27/2018   ID:  Eric [name], DOB Sep 04, 1929, MRN [id number]  Patient Location: Home Provider Location: Office  PCP:  [name], MD  Cardiologist:  [name], MD  Electrophysiologist:  None   Evaluation Performed:  Follow-Up Visit  Chief Complaint:  AI  History of Present Illness:    Eric [name] is a 85 y.o. male with severe AI.   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  He has had an aortic valve replacement , MV repair and single vessel bypass in 2009. Hehad a negative stress test in 2011. No chest pain prior to valve replacement surgery. He did have SHOB.   In late 2015, his heart rate was noted to be elevated at the time of a tooth extraction. He was found to be in atrial flutter. He underwent successful cardioversion. He notes that during the time he was out of rhythm, he had a cough. This got better after the cardioversion.   He went to [place] in February 2016 for about 3 weeks and did well on the NOAC. Less bleeding and bruising on Eliquis compared to his experience Coumadin. He then changed from Coumadin to Eliquisto treat multiple DVT and PE.  Hehad atooth extraction. Whenhe stayed on his anticoagulation, he had significant bleeding  issues at that time.THe next time,he cameoff of the Eliquis.   Inlate 2017, he had several instances of blood clots in his urine. He underwent cystoscopy in the urology clinic after holding out was for 4 days. Thisshowed a malignancy. He had surgery and tolerated this well.  He has noticed more edema in his right leg since surgery back in 2017.It persists.  In late 2019, he was in [place] and was volume overloaded and short of breath. He was given IV Lasix and diuresed well. He was kept overnight. He was also found to be in AFib. He dd not feel Palpitations.   They were eating out all of the time. He had stopped his Lasix with his bladder cancer. Echo showed normal LV function and 3+ AI.  In early 2020,He had a procedure on the left ear. It was bleeding and it was cauterized. 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    I spoke to his PMD, Dr. Laurann Montana in 07/2018.  His labs revealed concern for low grade hemolysis.  TEE was considered but the patient was in [place], and given the COVID outbreak, the procedure was postponed.    He required repeat surgical procedure on the squamous cell area.  He had some bleeding post procedure and required more stitches.  He required a skin graft.    TEE was done in 09/2018:  The left ventricle has low normal systolic function, with an ejection fraction of 50-55%. Left ventricular diastolic function could not be evaluated. No  evidence of left ventricular regional wall motion abnormalities.  2. The right ventricle has normal systolc function. The cavity was normal. There is no increase in right ventricular wall thickness.  3. Left atrial size was mildly dilated.  4. Right atrial size was mildly dilated.  5. Moderately thickened tricuspid valve leaflets.  6. 63mm Sorin mitral valve repair annular ring intact.  7. 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  8. A 28 Mitralflow porcine bioprosthesis valve  is present in the aortic position. Echo findings are consistent with intravalvular regurgitation of the aortic prosthesis.  9. Aortic valve regurgitation is severe by color flow Doppler. Mild stenosis of the aortic valve. 10. No vegetation on the aortic valve. 11. The aortic root is normal in size and structure. 12. Previously ligated left atrial appendage. 13. Severe bioprosthetic aortic valve regurgitation.  D/w Dr. [name] with plans for TAVR w/u.  The patient does not have symptoms concerning for COVID-19 infection (fever, chills, cough, or new shortness of breath).    Past Medical History:  Diagnosis Date  . Atrial flutter, paroxysmal ([place])    a. dx 04-02-2014, s/p  successful cardioversion 04-06-2014.  . Bilateral lower extremity edema    CHRONIC  . Bladder tumor   . Chronic anticoagulation    on Eliquis--  due to recurrent dvt's and pe's  . Dyslipidemia   . Frequency of urination   . History of basal cell carcinoma excision    06/ 2012  left upper back;   right forehead 07/ 2014  . History of bladder cancer urologist-  dr [name]   02-13-2016  s/p TURBT per path high grade papillary urothelial carcinoma  . History of DVT (deep vein thrombosis)    2000-- RLE  . History of melanoma excision    left flank;  07/ 2014 left lower leg and right upper chest  . History of prostate cancer    Gleason 6--  s/p  radical prostatectomy 06/ 2000 in [place]---  no recurrence  . History of pulmonary embolus (PE)    2000 & 2005  bilateral  . Hx of valvuloplasty 06/19/2007   a. s/p mitral ring annuloplasty, repair ruptured chordae of P2 flail segments of MV  . Nocturia more than twice per night   . S/P aortic valve replacement with bioprosthetic valve    06-19-2007  severe aortic valve stenosis  . S/P CABG x 1    06-19-2007  SVG to OM1  . Single vessel coronary artery disease   cardiologist-  dr Irish Lack   a. 05/2007 CABG x 1: s/p SVG-OM;  b. 10/2010 Ex MV: EF 72%, inf attenuation w/o  ischemia, brief run of PAT with exercise. To  . Thrombocytopenia ([place])   . Thyroid goiter 2013   nodular  . Urgency of urination   . Wears hearing aid    BILATERAL  . Wears partial dentures    UPPER   Past Surgical History:  Procedure Laterality Date  . AORTIC VALVE REPLACEMENT (AVR)/CORONARY ARTERY BYPASS GRAFTING (CABG)  06-19-2007  dr [name]   SVG to OM1 ;   AVR w/ #23 Mitralflow pericardial and ligation left atrial appendage;  MV repair w/ #28 Sorin 3-D memo Ring Annuloplasty with repair ruptured chordar of P-2 flail segments  . CARDIAC CATHETERIZATION  05-12-2007  dr Leonia Reeves   single vessel 30% ostial LAD,  80% LCx;  severe to critial AS,  moderate MR,  normal LVF, ef 70%,  normal right heart pressures and  cardiac outputs,  mild elevated LV end-diastolic pressure    . CARDIOVASCULAR STRESS TEST  11-02-2010  dr Irish Lack   normal nuclear study w/ no ischemia or infarct/scar/  normal LV function and wall motion , ef 72%  . CARDIOVERSION N/A 04/06/2014   Procedure: CARDIOVERSION;  Surgeon: [name], MD;  Location: [facility];  Service: Cardiovascular;  Laterality: N/A;   successful  . CATARACT EXTRACTION W/ INTRAOCULAR LENS  IMPLANT, BILATERAL  2012  approx  . RETROPUBIC RADICAL PROSTATECTOMY  06/ 2000  in [place], [place] Left 09/1998  . TEE WITHOUT CARDIOVERSION N/A 04/06/2014   Procedure: TRANSESOPHAGEAL ECHOCARDIOGRAM (TEE);  Surgeon: [name], MD;  Location: [facility] ENDOSCOPY;  Service: Cardiovascular;  Laterality: N/A;  mild focal basa LVH of the septum, ef 50-55%, s/p AV annuloplasty ring, elongated chordae, thickened leaflets, mild to mod. MR, multiple small jets/arotic bioprosthetic valve sits well in position, mild AI/ thickened TV w/ mild reurg./mild LA  . TEE WITHOUT CARDIOVERSION N/A 10/22/2018   Procedure: TRANSESOPHAGEAL ECHOCARDIOGRAM (TEE);  Surgeon: [name], MD;  Location: [facility] ENDOSCOPY;  Service: Cardiovascular;  Laterality: N/A;  .  TRANSTHORACIC ECHOCARDIOGRAM  03-23-2016   dr Irish Lack   EF 55-60%, reduced contribution atrial contraction to ventricular filling, due to increased ventricular diastolic pressure or atrial contratile dysfunction/  bioprosthetic AV w/ mod. regurg. (valve area 1.97cm^2)/ mild dilated ascending aorta/ mild thicken MV normal function annular ring prosthesis/severe LAE & mod. to sev.RAE/ PASP 34mmHg/ mild TR/ ventricle septal motion show paradox  . TRANSURETHRAL RESECTION OF BLADDER TUMOR N/A 02/13/2016   Procedure: TRANSURETHRAL RESECTION OF BLADDER TUMOR (TURBT);  Surgeon: Nickie Retort, MD;  Location[facility];  Service: Urology;  Laterality: N/A;  . TRANSURETHRAL RESECTION OF BLADDER TUMOR N/A 09/04/2016   Procedure: TRANSURETHRAL RESECTION OF BLADDER TUMOR (TURBT);  Surgeon: Nickie Retort, MD;  Location: [facility];  Service: Urology;  Laterality: N/A;     Current Meds  Medication Sig  . atorvastatin (LIPITOR) 40 MG tablet TAKE 1 TABLET EVERY EVENING  . ELIQUIS 5 MG TABS tablet TAKE 1 TABLET TWICE A DAY  . ezetimibe (ZETIA) 10 MG tablet Take 1 tablet (10 mg total) by mouth every evening.  . furosemide (LASIX) 20 MG tablet Take 2 tablets (40 mg total) by mouth daily.  . metoprolol tartrate (LOPRESSOR) 25 MG tablet Take 0.5 tablets (12.5 mg total) by mouth 2 (two) times daily.     Allergies:   Patient has no known allergies.   Social History   Tobacco Use  . Smoking status: Never Smoker  . Smokeless tobacco: Never Used  Substance Use Topics  . Alcohol use: Yes    Alcohol/week: 1.0 - 2.0 standard drinks    Types: 1 - 2 Standard drinks or equivalent per week  . Drug use: No     Family Hx: The patient's family history includes Leukemia in his mother. There is no history of Heart attack, Stroke, or Hypertension.  ROS:   Please see the history of present illness.    Sore throat post TEE.   All other systems reviewed and are negative.   Prior  CV studies:   The following studies were reviewed today:  TEE results as noted above  Labs/Other Tests and Data Reviewed:    EKG:  An ECG dated 12/19 was personally reviewed today and demonstrated:  AFib, rate controlled  Recent Labs: 06/19/2018: BUN 23; Creatinine, Ser 1.20; Potassium 4.3;  Sodium 143   Recent Lipid Panel No results found for: CHOL, TRIG, HDL, CHOLHDL, LDLCALC, LDLDIRECT  Wt Readings from Last 3 Encounters:  10/27/18 187 lb (84.8 kg)  10/22/18 187 lb (84.8 kg)  08/14/18 185 lb (83.9 kg)     Objective:    Vital Signs:  BP (!) 121/42   Pulse 65   Temp (!) 97.3 F (36.3 C)   Ht 6' (1.829 m)   Wt 187 lb (84.8 kg)   BMI 25.36 kg/m    VITAL SIGNS:  reviewed GEN:  no acute distress RESPIRATORY:  no shortness of breath PSYCH:  normal affect exam limited by phone format  ASSESSMENT & PLAN:    1. S/p AVR:  Severe AI.  Will need R and Left cath for possible TAVR.  We discussed his TEE results and plans for TAVR as well.  TR was noted.  2. CAD: No angina.  Single vessel CABG in 2009.   3. CHronic diastolic heart failure: Appears euvolemic with Lasix.   No leg swelling. 4. Anticoagulated: No bleeding issues. 5. AFib: Rate controlled.  Hold Eliquis 48 hours preprocedure.    Cardiac catheterization was discussed with the patient fully. The patient understands that risks include but are not limited to stroke (1 in 1000), death (1 in 31), kidney failure [usually temporary] (1 in 500), bleeding (1 in 200), allergic reaction [possibly serious] (1 in 200).  The patient understands and is willing to proceed.     COVID-19 Education: The signs and symptoms of COVID-19 were discussed with the patient and how to seek care for testing (follow up with PCP or arrange E-visit).  The importance of social distancing was discussed today.  Time:   Today, I have spent 25 minutes with the patient with telehealth technology discussing the above problems.     Medication  Adjustments/Labs and Tests Ordered: Current medicines are reviewed at length with the patient today.  Concerns regarding medicines are outlined above.   Tests Ordered: No orders of the defined types were placed in this encounter.   Medication Changes: No orders of the defined types were placed in this encounter.   Disposition:  Follow up in after valve evaluation with Dr. [name]  Signed, [name], MD  10/27/2018 10:26 AM    [place]

## 2018-10-27 ENCOUNTER — Telehealth (INDEPENDENT_AMBULATORY_CARE_PROVIDER_SITE_OTHER): Payer: Medicare Other | Admitting: Interventional Cardiology

## 2018-10-27 ENCOUNTER — Other Ambulatory Visit: Payer: Self-pay

## 2018-10-27 ENCOUNTER — Encounter: Payer: Self-pay | Admitting: Interventional Cardiology

## 2018-10-27 VITALS — BP 121/42 | HR 65 | Temp 97.3°F | Ht 72.0 in | Wt 187.0 lb

## 2018-10-27 DIAGNOSIS — I351 Nonrheumatic aortic (valve) insufficiency: Secondary | ICD-10-CM

## 2018-10-27 DIAGNOSIS — I5032 Chronic diastolic (congestive) heart failure: Secondary | ICD-10-CM

## 2018-10-27 DIAGNOSIS — Z7901 Long term (current) use of anticoagulants: Secondary | ICD-10-CM

## 2018-10-27 DIAGNOSIS — Z952 Presence of prosthetic heart valve: Secondary | ICD-10-CM

## 2018-10-27 DIAGNOSIS — I2510 Atherosclerotic heart disease of native coronary artery without angina pectoris: Secondary | ICD-10-CM

## 2018-10-27 NOTE — Patient Instructions (Signed)
Medication Instructions:  Your physician recommends that you continue on your current medications as directed. Please refer to the Current Medication list given to you today.  If you need a refill on your cardiac medications before your next appointment, please call your pharmacy.   Lab work: None Ordered  If you have labs (blood work) drawn today and your tests are completely normal, you will receive your results only by: Marland Kitchen MyChart Message (if you have MyChart) OR . A paper copy in the mail If you have any lab test that is abnormal or we need to change your treatment, we will call you to review the results.  Testing/Procedures: Your physician has requested that you have a cardiac catheterization on 10/29/18. Cardiac catheterization is used to diagnose and/or treat various heart conditions. Doctors may recommend this procedure for a number of different reasons. The most common reason is to evaluate chest pain. Chest pain can be a symptom of coronary artery disease (CAD), and cardiac catheterization can show whether plaque is narrowing or blocking your heart's arteries. This procedure is also used to evaluate the valves, as well as measure the blood flow and oxygen levels in different parts of your heart. For further information please visit [email or web address]. Please follow instruction sheet, as given.  Follow-Up: . Based on Heart Catheterization results  Any Other Special Instructions Will Be Listed Below (If Applicable).     [name] [place], [place] [address] Dept: [phone] Loc: [phone]  Eric [name]  10/27/2018  You are scheduled for a Cardiac Catheterization on Wednesday, June 10 with Dr. [name].  1. Please arrive at the [facility] (Main Entrance A) at [facility]: [address] [place], [place] [address] at 12:00 PM (This time is two hours  before your procedure to ensure your preparation). Free valet parking service is available.   Special note: Every effort is made to have your procedure done on time. Please understand that emergencies sometimes delay scheduled procedures.  2. Diet: Do not eat solid foods after midnight.  The patient may have clear liquids until 9 am upon the day of the procedure.  3. Labs: Labs to be drawn at the hospital on the day of your procedure. COVID test done on 10/24/18  4. Medication instructions in preparation for your procedure:   Contrast Allergy: No  HOLD Eliquis for 48 hours prior to your procedure  HOLD furosemide (lasix) the morning of your procedure  On the morning of your procedure, take a baby Aspirin 81 mg and any morning medicines NOT listed above.  You may use sips of water.  5. Plan for one night stay--bring personal belongings. 6. Bring a current list of your medications and current insurance cards. 7. You MUST have a responsible person to drive you home. 8. Someone MUST be with you the first 24 hours after you arrive home or your discharge will be delayed. 9. Please wear clothes that are easy to get on and off and wear slip-on shoes.  Thank you for allowing [place] to care for you!   -- [name]

## 2018-10-28 ENCOUNTER — Other Ambulatory Visit: Payer: Self-pay

## 2018-10-28 ENCOUNTER — Telehealth: Payer: Self-pay | Admitting: *Deleted

## 2018-10-28 ENCOUNTER — Encounter (HOSPITAL_COMMUNITY): Payer: Self-pay | Admitting: Physician Assistant

## 2018-10-28 DIAGNOSIS — I350 Nonrheumatic aortic (valve) stenosis: Secondary | ICD-10-CM

## 2018-10-28 NOTE — Telephone Encounter (Addendum)
error 

## 2018-10-28 NOTE — Consult Note (Addendum)
[facility]VE TEAM  Inpatient TAVR Consultation:   Patient ID: [id number] [name]; [id number]; June 02, 1929   Admit date: 10/29/2018 Date of Consult: 10/29/2018  Primary Care Provider: [name], MD Primary Cardiologist: Dr. Irish Lack   Patient Profile:   Eric [name] is a 85 y.o. 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 Irish Lack.  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 He and his wife moved to [place] from [place] around 20 years ago. He has a daughter that lives in [place], [name] and his son lives in [place]. 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 He enjoys traveling with his wife. He says he has slowed down over the past year or so in his activity, but more so since his admission in [place] last November. Of note, he has regular dental work with no active issues.   Patient has a history of several different cancers as well as recurrent DVT/PE. He was diagnosed with severe AS and severe MR in 2008. [facility] ([facility]) 05/17/2007 showed 30% eccentric narrowing of the ostium of the LAD, 80% tubular proximal stenosis of a large marginal branch. He underwent AVR with a 10mm Mitroflow pericardial valve, mitral valve repair w/ ring annuloplasty and single vessel bypass with SVG--> OM1 in 2009 by Dr. [name].   He was been followed by Dr Irish Lack and done very well. In 2015, he went into atrial flutter with RVR after dental extraction with profuse bleeding due to [facility]. He underwent successful cardioversion. He had been on Eliquis for recurrent DVT/PE.   He  was in his usual state of health until he was admitted to an outside hospital in [place], [place] in 03/2018. Per Dr. Hassell Done report he was admitted overnight for heart failure and afib. Echo 04/17/18 showed EF 55%, normal RV size/function, RVSP 40 mm Hg, s/p AVR with moderate central AI, s/p mitral repair with normal function.   Since that time he has been back on lasix and felt more out of breath. Lab work done by [employer] in 07/2018 revealed concern for low grade hemolysis. TEE was considered but the patient was in [place], and given the COVID outbreak, the procedure was postponed.    TEE was finally completed on 10/22/18 and showed EF 50-55%, normally functioning mitral valve repair, mod-severe TR, severe AI. AoV mean gradient 55mm Hg, peak gradient 27 mm Hg. Dr. Irish Lack referred him to Dr. [name] for structural heart consults and consideration of TAVR. Today he [facility] for [facility] as a part of pre TAVR work up.   Patient is seen in short stay. Wife was conferenced in over the phone. He currently feels good.  He denies chest pain. He has mild dyspnea on exertion that has been worse since last November. LE edema is chronic and mildly worse recently. No dizziness or syncope. No blood in stool or urine.    Past Medical History:  Diagnosis Date   Atrial flutter, paroxysmal ([place])    a. dx 04-02-2014, s/p  successful cardioversion 04-06-2014.   Bladder tumor    Chronic anticoagulation    on Eliquis--  due  to recurrent dvt's and pe's   Dyslipidemia    History of bladder cancer urologist-  dr [name]   02-13-2016  s/p TURBT per path high grade papillary urothelial carcinoma   History of DVT (deep vein thrombosis)    2000-- RLE   History of melanoma excision    left flank;  07/ 2014 left lower leg and right upper chest   History of prostate cancer    Gleason 6--  s/p  radical prostatectomy 06/ 2000 in [place]---  no recurrence   History of pulmonary embolus (PE)    2000 & 2005   bilateral   Hx of valvuloplasty 06/19/2007   a. s/p mitral ring annuloplasty, repair ruptured chordae of P2 flail segments of MV   S/P aortic valve replacement with bioprosthetic valve    06-19-2007  severe aortic valve stenosis   Single vessel coronary artery disease   cardiologist-  dr Irish Lack   a. 05/2007 CABG x 1: s/p SVG-OM;  b. 10/2010 Ex MV: EF 72%, inf attenuation w/o ischemia, brief run of PAT with exercise. To   Thrombocytopenia ([place])    Thyroid goiter 2013   nodular   Wears hearing aid    BILATERAL   Wears partial dentures    UPPER    Past Surgical History:  Procedure Laterality Date   AORTIC VALVE REPLACEMENT (AVR)/CORONARY ARTERY BYPASS GRAFTING (CABG)  06-19-2007  dr [name]   SVG to OM1 ;   AVR w/ #23 Mitralflow pericardial and ligation left atrial appendage;  MV repair w/ #28 Sorin 3-D memo Ring Annuloplasty with repair ruptured chordar of P-2 flail segments   CARDIAC CATHETERIZATION  05-12-2007  dr Leonia Reeves   single vessel 30% ostial LAD,  80% LCx;  severe to critial AS,  moderate MR,  normal LVF, ef 70%,  normal right heart pressures and cardiac outputs,  mild elevated LV end-diastolic pressure     CARDIOVASCULAR STRESS TEST  11-02-2010  dr Irish Lack   normal nuclear study w/ no ischemia or infarct/scar/  normal LV function and wall motion , ef 72%   CARDIOVERSION N/A 04/06/2014   Procedure: CARDIOVERSION;  Surgeon: [name], MD;  Location: [facility];  Service: Cardiovascular;  Laterality: N/A;   successful   CATARACT EXTRACTION W/ INTRAOCULAR LENS  IMPLANT, BILATERAL  2012  approx   RETROPUBIC RADICAL PROSTATECTOMY  06/ 2000  in [place], [place] Left 09/1998   TEE WITHOUT CARDIOVERSION N/A 04/06/2014   Procedure: TRANSESOPHAGEAL ECHOCARDIOGRAM (TEE);  Surgeon: [name], MD;  Location: [facility] ENDOSCOPY;  Service: Cardiovascular;  Laterality: N/A;  mild focal basa LVH of the septum, ef 50-55%, s/p AV annuloplasty ring,  elongated chordae, thickened leaflets, mild to mod. MR, multiple small jets/arotic bioprosthetic valve sits well in position, mild AI/ thickened TV w/ mild reurg./mild LA   TEE WITHOUT CARDIOVERSION N/A 10/22/2018   Procedure: TRANSESOPHAGEAL ECHOCARDIOGRAM (TEE);  Surgeon: [name], MD;  Location[facility] ENDOSCOPY;  Service: Cardiovascular;  Laterality: N/A;   TRANSTHORACIC ECHOCARDIOGRAM  03-23-2016   dr Irish Lack   EF 55-60%, reduced contribution atrial contraction to ventricular filling, due to increased ventricular diastolic pressure or atrial contratile dysfunction/  bioprosthetic AV w/ mod. regurg. (valve area 1.97cm^2)/ mild dilated ascending aorta/ mild thicken MV normal function annular ring prosthesis/severe LAE & mod. to sev.RAE/ PASP 93mmHg/ mild TR/ ventricle septal motion show paradox   TRANSURETHRAL RESECTION OF BLADDER TUMOR N/A 02/13/2016   Procedure: TRANSURETHRAL RESECTION OF BLADDER TUMOR (TURBT);  Surgeon: Nickie Retort, MD;  Location: [facility];  Service: Urology;  Laterality: N/A;   TRANSURETHRAL RESECTION OF BLADDER TUMOR N/A 09/04/2016   Procedure: TRANSURETHRAL RESECTION OF BLADDER TUMOR (TURBT);  Surgeon: Nickie Retort, MD;  Location: [name];  Service: Urology;  Laterality: N/A;     Inpatient Medications: Scheduled Meds:  Continuous Infusions:  PRN Meds:   Allergies:   No Known Allergies  Social History:   Social History   Socioeconomic History   Marital status: Married    Spouse name: Not on file   Number of children: Not on file   Years of education: Not on file   Highest education level: Not on file  Occupational History   Not on file  Social Needs   Financial resource strain: Not on file   Food insecurity:    Worry: Not on file    Inability: Not on file   Transportation needs:    Medical: Not on file    Non-medical: Not on file  Tobacco Use   Smoking status: Never Smoker   Smokeless  tobacco: Never Used  Substance and Sexual Activity   Alcohol use: Yes    Alcohol/week: 1.0 - 2.0 standard drinks    Types: 1 - 2 Standard drinks or equivalent per week   Drug use: No   Sexual activity: Not on file  Lifestyle   Physical activity:    Days per week: Not on file    Minutes per session: Not on file   Stress: Not on file  Relationships   Social connections:    Talks on phone: Not on file    Gets together: Not on file    Attends religious service: Not on file    Active member of club or organization: Not on file    Attends meetings of clubs or organizations: Not on file    Relationship status: Not on file   Intimate partner violence:    Fear of current or ex partner: Not on file    Emotionally abused: Not on file    Physically abused: Not on file    Forced sexual activity: Not on file  Other Topics Concern   Not on file  Social History Narrative   Lives in [place] with wife.  Active.    Family History:   The patient's family history includes Leukemia in his mother. There is no history of Heart attack, Stroke, or Hypertension.  ROS:  Please see the history of present illness.  ROS  All other ROS reviewed and negative.     Physical Exam/Data:   Vitals:   10/29/18 1208 10/29/18 1254  BP: (!) 189/38 (!) 195/42  Pulse: 77   Temp: 98.3 F (36.8 C) 98.5 F (36.9 C)  TempSrc:  Temporal  SpO2: 97% 99%  Weight: 84.8 kg 84.8 kg  Height: 6' (1.829 m) 6' (1.829 m)   No intake or output data in the 24 hours ending 10/29/18 1319 Filed Weights   10/29/18 1208 10/29/18 1254  Weight: 84.8 kg 84.8 kg   Body mass index is 25.36 kg/m.  General:  Well nourished, well developed, in no acute distress HEENT: normal Lymph: no adenopathy Neck: no JVD Endocrine:  No thryomegaly Vascular: No carotid bruits; FA pulses 2+ bilaterally without bruits  Cardiac:  irregular; + murmur   Lungs:  clear to auscultation bilaterally, no wheezing, rhonchi or rales  Abd: soft,  nontender, no hepatomegaly  Ext: 1- 2+ bilateral LE edema  R>L Musculoskeletal:  No deformities, BUE and BLE strength normal and equal Skin: warm and dry  Neuro:  CNs 2-12 intact, no focal abnormalities noted Psych:  Normal affect   EKG:  The EKG was personally reviewed and demonstrates: from 04/23/18 atrial fibrillation    Relevant CV Studies: TEE 10/22/2018 IMPRESSIONS  1. The left ventricle has low normal systolic function, with an ejection fraction of 50-55%. Left ventricular diastolic function could not be evaluated. No evidence of left ventricular regional wall motion abnormalities.  2. The right ventricle has normal systolc function. The cavity was normal. There is no increase in right ventricular wall thickness.  3. Left atrial size was mildly dilated.  4. Right atrial size was mildly dilated.  5. Moderately thickened tricuspid valve leaflets.  6. 66mm Sorin mitral valve repair annular ring intact.  7. 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  8. A 28 Mitralflow porcine bioprosthesis valve is present in the aortic position. Echo findings are consistent with intravalvular regurgitation of the aortic prosthesis.  9. Aortic valve regurgitation is severe by color flow Doppler. Mild stenosis of the aortic valve. 10. No vegetation on the aortic valve. 11. The aortic root is normal in size and structure. 12. Previously ligated left atrial appendage. 13. Severe bioprosthetic aortic valve regurgitation.  Laboratory Data:  ChemistryNo results for input(s): NA, K, CL, CO2, GLUCOSE, BUN, CREATININE, CALCIUM, GFRNONAA, GFRAA, ANIONGAP in the last 168 hours.  No results for input(s): PROT, ALBUMIN, AST, ALT, ALKPHOS, BILITOT in the last 168 hours. Hematology Recent Labs  Lab 10/29/18 1237  WBC 5.5  RBC 4.59  HGB 14.2  HCT 43.1  MCV 93.9  MCH 30.9  MCHC 32.9  RDW 12.7  PLT 131*   Cardiac EnzymesNo results for input(s): TROPONINI in the  last 168 hours. No results for input(s): TROPIPOC in the last 168 hours.  BNPNo results for input(s): BNP, PROBNP in the last 168 hours.  DDimer No results for input(s): DDIMER in the last 168 hours.  Radiology/Studies:  No results found.   STS Risk Calculator: Procedure: AV Replacement    Risk of Mortality:  6.201%   Renal Failure:  4.485%   Permanent Stroke:  1.924%   Prolonged Ventilation:  13.559%   DSW Infection:  0.158%   Reoperation:  4.700%   Morbidity or Mortality:  21.451%   Short Length of Stay:  18.476%   Long Length of Stay:  10.179%    Assessment and Plan:   Eric [name] is a 85 y.o. male with symptoms of severe prosthetic valve dysfunction with severe AI with NYHA Class II symptoms. I have reviewed the patient's recent echocardiogram which is notable for low normal LV systolic function and severe aortic insufficiency as well as mod-severe TR. Today he presents for [facility].   I have reviewed the natural history of aortic stenosis with the patient. We have discussed the limitations of medical therapy and the poor prognosis associated with symptomatic aortic stenosis. We have reviewed potential treatment options, including palliative medical therapy, conventional surgical aortic valve replacement, and transcatheter aortic valve replacement. We discussed treatment options in the context of this patient's specific comorbid medical conditions.   Mr Want is an active man who enjoys traveling with his wife. He had done well since his previous AVR, mitral valve repair and Cabg x1 in 2009. He was admitted about 7 months ago in [place] for heart failure and new onset atrial fibrillation. He has noticed a decline in  his functional capacity and breathing since then. Recent blood work showed evidence of mild hemolysis and TEE showed severe AI and mod-severe TR.   The patient's predicted risk of mortality with conventional aortic valve replacement is 6.201% primarily based on age,  previous double valve and bypass surgery, afib, HTN, severe TR, and previous cancers. TAVR seems like a reasonable treatment option for this patient pending formal cardiac surgical consultation. We discussed typical evaluation which will require a gated cardiac CTA and a CTA of the chest/abdomen/pelvis to evaluate both his cardiac anatomy and peripheral vasculature. Follow-up testing will be arranged as an outpatient.   Dr. [name] to follow.    Signed, Angelena Form, PA-C  10/29/2018 1:19 PM  Patient seen, examined. Available data reviewed. Agree with findings, assessment, and plan as outlined by Nell Range, PA-C.  On my examination, he is an alert, oriented, elderly gentleman in no acute distress.  HEENT is normal, JVP is normal, carotid upstrokes are normal without bruits, lungs are clear to auscultation bilaterally, heart is regular rate and rhythm with a grade 2/6 systolic murmur at the right upper sternal border and a grade 3/6 diastolic decrescendo murmur best heard at the left upper sternal border.  Abdomen is soft and nontender with no masses.  Extremities show 2+ femoral pulses bilaterally, 2+ radial pulses bilaterally, and 1+ right ankle edema with trace left ankle edema.  Skin is warm and dry without rash.  Neurologic is grossly intact with 5/5 strength in the arms and legs bilaterally.  The patient is independently interviewed and examined.  He is a very nice 85 year old gentleman with history of coronary disease, aortic valve disease, and mitral valve disease.  The patient underwent bioprosthetic aortic valve replacement, mitral valve repair, and single-vessel bypass surgery in 2009.  He is generally done very well since his surgery and his main style.  The patient is chronically anticoagulated with history of paroxysmal atrial fibrillation and also recurrent DVT.  He was hospitalized in [place] several months ago with atrial fibrillation and congestive heart failure.  Since that time he  has been noted to have progressive exercise intolerance and exertional dyspnea without chest pain.  The patient now has New York Heart Association functional class II symptoms of chronic diastolic heart failure.  Echo assessment has demonstrated an intact mitral valve repair site, severe bioprosthetic aortic valve insufficiency, and moderate to severe tricuspid regurgitation.  The patient has preserved LV and RV systolic function.  I have personally reviewed the patient's surface echo and TEE studies which demonstrate findings outlined above with degeneration of his aortic valve bioprosthesis and severe AI.  In the setting of symptomatic severe bioprosthetic aortic valve insufficiency, further evaluation for aortic valve replacement is indicated.  The patient will undergo right and left heart catheterization to evaluate native coronary and bypass graft patency as well as hemodynamic assessment.  He will then undergo CT angiogram studies of the heart as well as the chest, abdomen, and pelvis to determine anatomic feasibility of valve and valve TAVR.  Following the studies the patient will undergo evaluation by Dr. [name] who performed his initial surgery as part of a multidisciplinary valve team approach to his care.  All of this is reviewed with the patient and his wife today and all questions are answered.  [name], M.D. 10/30/2018 7:48 AM

## 2018-10-28 NOTE — Telephone Encounter (Signed)
Pt contacted pre-catheterization scheduled at [facility] for: Wednesday October 29, 2018 2 PM Verified arrival time and place: [place] Entrance A at: 12 noon  Covid-19 test date: 10/24/18  No solid food after midnight prior to cath, clear liquids until 5 AM day of procedure. Contrast allergy: no  Hold: Eliquis-last dose AM 10/27/18 until post procedure. Furosemide-AM of procedure.  Except hold medications AM meds can be  taken pre-cath with sip of water including: ASA 81 mg   Confirmed patient has responsible person to drive home post procedure and observe 24 hours after arriving home: yes  Due to Covid-19 pandemic no visitors are allowed in the hospital (unless cognitive impairment).  Their designated party will be called when their procedure is over for an update and to arrange pick up.  Patients are required to wear a mask when they enter the hospital.      COVID-19 Pre-Screening Questions:  . In the past 7 to 10 days have you had a cough,  shortness of breath, headache, congestion, fever (100 or greater) body aches, chills, sore throat, or sudden loss of taste or sense of smell? no . Have you been around anyone with known Covid 19? no . Have you been around anyone who is awaiting Covid 19 test results in the past 7 to 10 days? no . Have you been around anyone who has been exposed to Covid 19, or has mentioned symptoms of Covid 19 within the past 7 to 10 days? No  I reviewed procedure/mask/visitor/Covid 19 screening questions with patient, he verbalized understanding, thanked me for call.

## 2018-10-29 ENCOUNTER — Encounter (HOSPITAL_COMMUNITY)
Admission: RE | Disposition: A | Discharge status: Home / Self Care | Payer: Medicare Other | Source: Home / Self Care | Attending: Cardiovascular Disease

## 2018-10-29 ENCOUNTER — Ambulatory Visit (HOSPITAL_COMMUNITY)
Admission: RE | Admit: 2018-10-29 | Discharge: 2018-10-29 | Disposition: A | Discharge status: Home / Self Care | Payer: Medicare Other | Attending: Cardiovascular Disease | Admitting: Cardiovascular Disease

## 2018-10-29 ENCOUNTER — Other Ambulatory Visit: Payer: Self-pay

## 2018-10-29 DIAGNOSIS — I351 Nonrheumatic aortic (valve) insufficiency: Secondary | ICD-10-CM | POA: Diagnosis not present

## 2018-10-29 DIAGNOSIS — I4892 Unspecified atrial flutter: Secondary | ICD-10-CM | POA: Diagnosis not present

## 2018-10-29 DIAGNOSIS — I5032 Chronic diastolic (congestive) heart failure: Secondary | ICD-10-CM | POA: Diagnosis not present

## 2018-10-29 DIAGNOSIS — Z8551 Personal history of malignant neoplasm of bladder: Secondary | ICD-10-CM | POA: Insufficient documentation

## 2018-10-29 DIAGNOSIS — I4891 Unspecified atrial fibrillation: Secondary | ICD-10-CM | POA: Insufficient documentation

## 2018-10-29 DIAGNOSIS — Z86718 Personal history of other venous thrombosis and embolism: Secondary | ICD-10-CM | POA: Insufficient documentation

## 2018-10-29 DIAGNOSIS — R350 Frequency of micturition: Secondary | ICD-10-CM | POA: Diagnosis not present

## 2018-10-29 DIAGNOSIS — Z79899 Other long term (current) drug therapy: Secondary | ICD-10-CM | POA: Diagnosis not present

## 2018-10-29 DIAGNOSIS — Z953 Presence of xenogenic heart valve: Secondary | ICD-10-CM | POA: Diagnosis not present

## 2018-10-29 DIAGNOSIS — E785 Hyperlipidemia, unspecified: Secondary | ICD-10-CM | POA: Insufficient documentation

## 2018-10-29 DIAGNOSIS — Z7901 Long term (current) use of anticoagulants: Secondary | ICD-10-CM | POA: Diagnosis not present

## 2018-10-29 DIAGNOSIS — Z86711 Personal history of pulmonary embolism: Secondary | ICD-10-CM | POA: Insufficient documentation

## 2018-10-29 DIAGNOSIS — Z951 Presence of aortocoronary bypass graft: Secondary | ICD-10-CM | POA: Insufficient documentation

## 2018-10-29 DIAGNOSIS — I2510 Atherosclerotic heart disease of native coronary artery without angina pectoris: Secondary | ICD-10-CM | POA: Diagnosis not present

## 2018-10-29 DIAGNOSIS — I352 Nonrheumatic aortic (valve) stenosis with insufficiency: Secondary | ICD-10-CM | POA: Insufficient documentation

## 2018-10-29 HISTORY — PX: RIGHT/LEFT HEART CATH AND CORONARY/GRAFT ANGIOGRAPHY: CATH118267

## 2018-10-29 LAB — POCT I-STAT EG7
Acid-base deficit: 1 mmol/L (ref 0.0–2.0)
Bicarbonate: 24 mmol/L (ref 20.0–28.0)
Calcium, Ion: 1.14 mmol/L — ABNORMAL LOW (ref 1.15–1.40)
HCT: 37 % — ABNORMAL LOW (ref 39.0–52.0)
Hemoglobin: 12.6 g/dL — ABNORMAL LOW (ref 13.0–17.0)
O2 Saturation: 71 %
Potassium: 3.9 mmol/L (ref 3.5–5.1)
Sodium: 145 mmol/L (ref 135–145)
TCO2: 25 mmol/L (ref 22–32)
pCO2, Ven: 41 mmHg — ABNORMAL LOW (ref 44.0–60.0)
pH, Ven: 7.375 (ref 7.250–7.430)
pO2, Ven: 38 mmHg (ref 32.0–45.0)

## 2018-10-29 LAB — BASIC METABOLIC PANEL
Anion gap: 9 (ref 5–15)
BUN: 29 mg/dL — ABNORMAL HIGH (ref 8–23)
CO2: 26 mmol/L (ref 22–32)
Calcium: 9.7 mg/dL (ref 8.9–10.3)
Chloride: 106 mmol/L (ref 98–111)
Creatinine, Ser: 1.22 mg/dL (ref 0.61–1.24)
GFR calc Af Amer: >60 mL/min (ref >60)
GFR calc non Af Amer: 53 mL/min — ABNORMAL LOW (ref >60)
Glucose, Bld: 101 mg/dL — ABNORMAL HIGH (ref 70–99)
Potassium: 4.7 mmol/L (ref 3.5–5.1)
Sodium: 141 mmol/L (ref 135–145)

## 2018-10-29 LAB — POCT I-STAT 7, (LYTES, BLD GAS, ICA,H+H)
Acid-base deficit: 1 mmol/L (ref 0.0–2.0)
Bicarbonate: 23.4 mmol/L (ref 20.0–28.0)
Calcium, Ion: 1.24 mmol/L (ref 1.15–1.40)
HCT: 37 % — ABNORMAL LOW (ref 39.0–52.0)
Hemoglobin: 12.6 g/dL — ABNORMAL LOW (ref 13.0–17.0)
O2 Saturation: 97 %
Potassium: 4.1 mmol/L (ref 3.5–5.1)
Sodium: 143 mmol/L (ref 135–145)
TCO2: 25 mmol/L (ref 22–32)
pCO2 arterial: 39.3 mmHg (ref 32.0–48.0)
pH, Arterial: 7.383 (ref 7.350–7.450)
pO2, Arterial: 90 mmHg (ref 83.0–108.0)

## 2018-10-29 LAB — CBC
HCT: 43.1 % (ref 39.0–52.0)
Hemoglobin: 14.2 g/dL (ref 13.0–17.0)
MCH: 30.9 pg (ref 26.0–34.0)
MCHC: 32.9 g/dL (ref 30.0–36.0)
MCV: 93.9 fL (ref 80.0–100.0)
Platelets: 131 10*3/uL — ABNORMAL LOW (ref 150–400)
RBC: 4.59 MIL/uL (ref 4.22–5.81)
RDW: 12.7 % (ref 11.5–15.5)
WBC: 5.5 10*3/uL (ref 4.0–10.5)
nRBC: 0 % (ref 0.0–0.2)

## 2018-10-29 SURGERY — RIGHT/LEFT HEART CATH AND CORONARY/GRAFT ANGIOGRAPHY
Anesthesia: LOCAL

## 2018-10-29 MED ORDER — SODIUM CHLORIDE 0.9% FLUSH: 3.0000 mL | Freq: Two times a day (BID) | INTRAVENOUS | Status: DC

## 2018-10-29 MED ORDER — HEPARIN SODIUM (PORCINE) 1000 UNIT/ML IJ SOLN
INTRAMUSCULAR | Status: DC | PRN
  Administered 2018-10-29: 4500 [IU] via INTRAVENOUS

## 2018-10-29 MED ORDER — HYDRALAZINE HCL 20 MG/ML IJ SOLN: 10.0000 mg | INTRAMUSCULAR | Status: DC | PRN

## 2018-10-29 MED ORDER — SODIUM CHLORIDE 0.9 % WEIGHT BASED INFUSION
3.0000 mL/kg/h | INTRAVENOUS | Status: AC
  Administered 2018-10-29: 3 mL/kg/h via INTRAVENOUS

## 2018-10-29 MED ORDER — FENTANYL CITRATE (PF) 100 MCG/2ML IJ SOLN
INTRAMUSCULAR | Status: AC
  Filled 2018-10-29: qty 2

## 2018-10-29 MED ORDER — VERAPAMIL HCL 2.5 MG/ML IV SOLN
INTRAVENOUS | Status: DC | PRN
  Administered 2018-10-29: 16:00:00 via INTRA_ARTERIAL

## 2018-10-29 MED ORDER — ASPIRIN 81 MG PO CHEW: 81.0000 mg | CHEWABLE_TABLET | ORAL | Status: DC

## 2018-10-29 MED ORDER — SODIUM CHLORIDE 0.9% FLUSH: 3.0000 mL | INTRAVENOUS | Status: DC | PRN

## 2018-10-29 MED ORDER — HEPARIN SODIUM (PORCINE) 1000 UNIT/ML IJ SOLN
INTRAMUSCULAR | Status: AC
  Filled 2018-10-29: qty 1

## 2018-10-29 MED ORDER — LABETALOL HCL 5 MG/ML IV SOLN: 10.0000 mg | INTRAVENOUS | Status: DC | PRN

## 2018-10-29 MED ORDER — FENTANYL CITRATE (PF) 100 MCG/2ML IJ SOLN
INTRAMUSCULAR | Status: DC | PRN
  Administered 2018-10-29: 25 ug via INTRAVENOUS

## 2018-10-29 MED ORDER — HEPARIN (PORCINE) IN NACL 1000-0.9 UT/500ML-% IV SOLN
INTRAVENOUS | Status: DC | PRN
  Administered 2018-10-29 (×2): 500 mL

## 2018-10-29 MED ORDER — MIDAZOLAM HCL 2 MG/2ML IJ SOLN
INTRAMUSCULAR | Status: DC | PRN
  Administered 2018-10-29: 1 mg via INTRAVENOUS

## 2018-10-29 MED ORDER — LIDOCAINE HCL (PF) 1 % IJ SOLN
INTRAMUSCULAR | Status: AC
  Filled 2018-10-29: qty 30

## 2018-10-29 MED ORDER — ACETAMINOPHEN 325 MG PO TABS: 650.0000 mg | ORAL_TABLET | ORAL | Status: DC | PRN

## 2018-10-29 MED ORDER — IOHEXOL 350 MG/ML SOLN
INTRAVENOUS | Status: DC | PRN
  Administered 2018-10-29: 45 mL via INTRAVENOUS

## 2018-10-29 MED ORDER — ONDANSETRON HCL 4 MG/2ML IJ SOLN: 4.0000 mg | Freq: Four times a day (QID) | INTRAMUSCULAR | Status: DC | PRN

## 2018-10-29 MED ORDER — LIDOCAINE HCL (PF) 1 % IJ SOLN
INTRAMUSCULAR | Status: DC | PRN
  Administered 2018-10-29: 2 mL

## 2018-10-29 MED ORDER — SODIUM CHLORIDE 0.9 % WEIGHT BASED INFUSION: 1.0000 mL/kg/h | INTRAVENOUS | Status: DC

## 2018-10-29 MED ORDER — SODIUM CHLORIDE 0.9 % IV SOLN: 250.0000 mL | INTRAVENOUS | Status: DC | PRN

## 2018-10-29 MED ORDER — HEPARIN (PORCINE) IN NACL 1000-0.9 UT/500ML-% IV SOLN
INTRAVENOUS | Status: AC
  Filled 2018-10-29: qty 1000

## 2018-10-29 MED ORDER — VERAPAMIL HCL 2.5 MG/ML IV SOLN
INTRAVENOUS | Status: AC
  Filled 2018-10-29: qty 2

## 2018-10-29 MED ORDER — MIDAZOLAM HCL 2 MG/2ML IJ SOLN
INTRAMUSCULAR | Status: AC
  Filled 2018-10-29: qty 2

## 2018-10-29 SURGICAL SUPPLY — 14 items
CATH 5FR JL3.5 JR4 ANG PIG MP (CATHETERS) ×1 IMPLANT
CATH BALLN WEDGE 5F 110CM (CATHETERS) ×1 IMPLANT
CATH INFINITI 5FR AL1 (CATHETERS) ×1 IMPLANT
CATH INFINITI 5FR JL4 (CATHETERS) ×1 IMPLANT
DEVICE RAD COMP TR BAND LRG (VASCULAR PRODUCTS) ×1 IMPLANT
GLIDESHEATH SLEND SS 6F .021 (SHEATH) ×1 IMPLANT
GUIDEWIRE INQWIRE 1.5J.035X260 (WIRE) IMPLANT
INQWIRE 1.5J .035X260CM (WIRE) ×2
KIT HEART LEFT (KITS) ×2 IMPLANT
PACK CARDIAC CATHETERIZATION (CUSTOM PROCEDURE TRAY) ×2 IMPLANT
SHEATH GLIDE SLENDER 4/5FR (SHEATH) ×1 IMPLANT
TRANSDUCER W/STOPCOCK (MISCELLANEOUS) ×2 IMPLANT
TUBING CIL FLEX 10 FLL-RA (TUBING) ×2 IMPLANT
WIRE EMERALD 3MM-J .025X260CM (WIRE) ×1 IMPLANT

## 2018-10-29 NOTE — Interval H&P Note (Signed)
History and Physical Interval Note:  10/29/2018 3:31 PM  Eric [name]  has presented today for surgery, with the diagnosis of aortic insuffiency.  The various methods of treatment have been discussed with the patient and family. After consideration of risks, benefits and other options for treatment, the patient has consented to  Procedure(s): RIGHT/LEFT HEART CATH AND CORONARY/GRAFT ANGIOGRAPHY (N/A) as a surgical intervention.  The patient's history has been reviewed, patient examined, no change in status, stable for surgery.  I have reviewed the patient's chart and labs.  Questions were answered to the patient's satisfaction.     [name]

## 2018-10-29 NOTE — Progress Notes (Signed)
D/c instructions given to wife via telephone and patient in room due to visitor restrictions.  All questions answered and Eric [name] verbalized understanding

## 2018-10-29 NOTE — Discharge Instructions (Signed)
Radial Site Care ° °This sheet gives you information about how to care for yourself after your procedure. Your health care provider may also give you more specific instructions. If you have problems or questions, contact your health care provider. °What can I expect after the procedure? °After the procedure, it is common to have: °· Bruising and tenderness at the catheter insertion area. °Follow these instructions at home: °Medicines °· Take over-the-counter and prescription medicines only as told by your health care provider. °Insertion site care °· Follow instructions from your health care provider about how to take care of your insertion site. Make sure you: °? Wash your hands with soap and water before you change your bandage (dressing). If soap and water are not available, use hand sanitizer. °? Change your dressing as told by your health care provider. °? Leave stitches (sutures), skin glue, or adhesive strips in place. These skin closures may need to stay in place for 2 weeks or longer. If adhesive strip edges start to loosen and curl up, you may trim the loose edges. Do not remove adhesive strips completely unless your health care provider tells you to do that. °· Check your insertion site every day for signs of infection. Check for: °? Redness, swelling, or pain. °? Fluid or blood. °? Pus or a bad smell. °? Warmth. °· Do not take baths, swim, or use a hot tub until your health care provider approves. °· You may shower 24-48 hours after the procedure, or as directed by your health care provider. °? Remove the dressing and gently wash the site with plain soap and water. °? Pat the area dry with a clean towel. °? Do not rub the site. That could cause bleeding. °· Do not apply powder or lotion to the site. °Activity ° °· For 24 hours after the procedure, or as directed by your health care provider: °? Do not flex or bend the affected arm. °? Do not push or pull heavy objects with the affected arm. °? Do not  drive yourself home from the hospital or clinic. You may drive 24 hours after the procedure unless your health care provider tells you not to. °? Do not operate machinery or power tools. °· Do not lift anything that is heavier than 10 lb (4.5 kg), or the limit that you are told, until your health care provider says that it is safe. °· Ask your health care provider when it is okay to: °? Return to work or school. °? Resume usual physical activities or sports. °? Resume sexual activity. °General instructions °· If the catheter site starts to bleed, raise your arm and put firm pressure on the site. If the bleeding does not stop, get help right away. This is a medical emergency. °· If you went home on the same day as your procedure, a responsible adult should be with you for the first 24 hours after you arrive home. °· Keep all follow-up visits as told by your health care provider. This is important. °Contact a health care provider if: °· You have a fever. °· You have redness, swelling, or yellow drainage around your insertion site. °Get help right away if: °· You have unusual pain at the radial site. °· The catheter insertion area swells very fast. °· The insertion area is bleeding, and the bleeding does not stop when you hold steady pressure on the area. °· Your arm or hand becomes pale, cool, tingly, or numb. °These symptoms may represent a serious problem   that is an emergency. Do not wait to see if the symptoms will go away. Get medical help right away. Call your local emergency services (911 in the [place] Do not drive yourself to the hospital. °Summary °· After the procedure, it is common to have bruising and tenderness at the site. °· Follow instructions from your health care provider about how to take care of your radial site wound. Check the wound every day for signs of infection. °· Do not lift anything that is heavier than 10 lb (4.5 kg), or the limit that you are told, until your health care provider says  that it is safe. °This information is not intended to replace advice given to you by your health care provider. Make sure you discuss any questions you have with your health care provider. °Document Released: 06/09/2010 Document Revised: 06/12/2017 Document Reviewed: 06/12/2017 °Elsevier Interactive Patient Education © 2019 [employer] ° °

## 2018-10-30 ENCOUNTER — Encounter (HOSPITAL_COMMUNITY): Payer: Self-pay | Admitting: Cardiovascular Disease

## 2018-11-03 ENCOUNTER — Encounter (HOSPITAL_COMMUNITY): Payer: Self-pay

## 2018-11-03 ENCOUNTER — Ambulatory Visit (HOSPITAL_COMMUNITY)
Admission: RE | Admit: 2018-11-03 | Discharge: 2018-11-03 | Disposition: A | Discharge status: Home / Self Care | Payer: Medicare Other | Source: Ambulatory Visit | Attending: Cardiovascular Disease | Admitting: Cardiovascular Disease

## 2018-11-03 ENCOUNTER — Other Ambulatory Visit: Payer: Self-pay

## 2018-11-03 ENCOUNTER — Encounter: Payer: Self-pay | Admitting: Physical Therapy

## 2018-11-03 ENCOUNTER — Ambulatory Visit: Payer: Medicare Other | Attending: Cardiovascular Disease | Admitting: Physical Therapy

## 2018-11-03 DIAGNOSIS — R262 Difficulty in walking, not elsewhere classified: Secondary | ICD-10-CM

## 2018-11-03 DIAGNOSIS — I350 Nonrheumatic aortic (valve) stenosis: Secondary | ICD-10-CM

## 2018-11-03 IMAGING — CT CT HEAR MORPH WITH CTA COR WITH SCORE WITH CA WITH CONTRAST AND
2 of 11 series · 7 of 20 positions shown, 8 images · non-contrast
Comparison: [DATE] chest CT angiogram.

Addendum:
CLINICAL DATA: AVR Pre Valve in Valve TAVR

EXAM:
Cardiac TAVR CT
TECHNIQUE: The patient was scanned on a Siemens Force [REDACTED]ice scanner. A 120
kV retrospective scan was triggered in the ascending thoracic aorta
at 140 HU's. Gantry rotation speed was 250 msecs and collimation was
.6 mm. No beta blockade or nitro were given. The 3D data set was
reconstructed in 5% intervals of the R-R cycle. Systolic and
diastolic phases were analyzed on a dedicated work station using
MPR, MIP and VRT modes. The patient received 80 cc of contrast.

[Series 8: 0-90% · axial · 0.40mm/px · z∈[+1116,+1237]mm · 3 of 4030 slices shown]
[im 1008/4030  vessel]
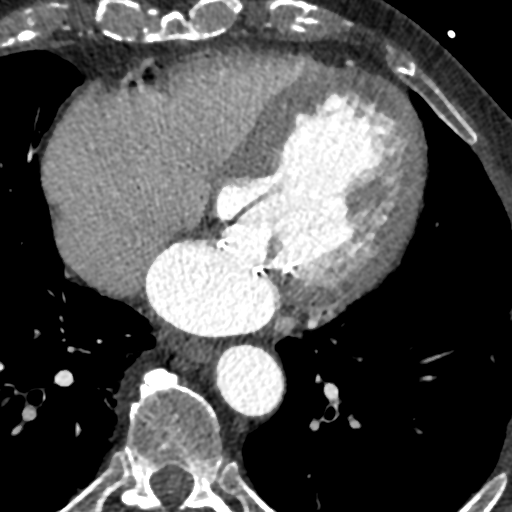
[im 2015/4030  vessel]
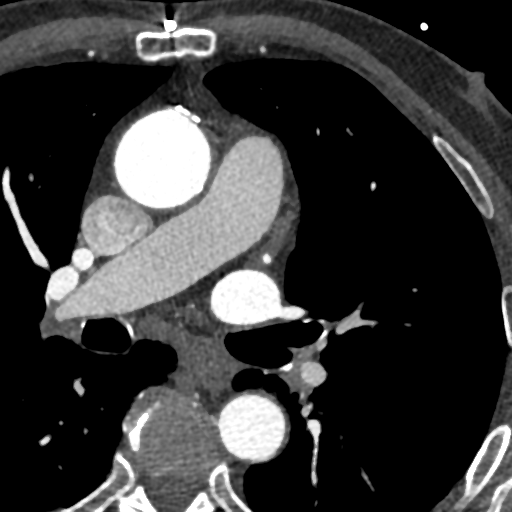
[im 3022/4030  vessel]
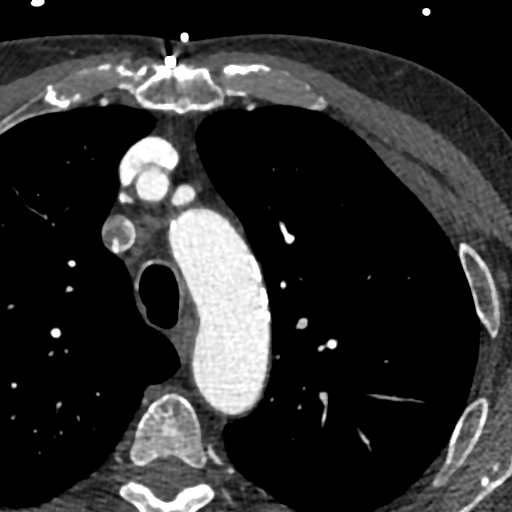

[Series 9: 5-95% · axial · 0.40mm/px · z∈[+1104,+1249]mm · 4 of 4030 slices shown, 5 images]
[im 806/4030  vessel]
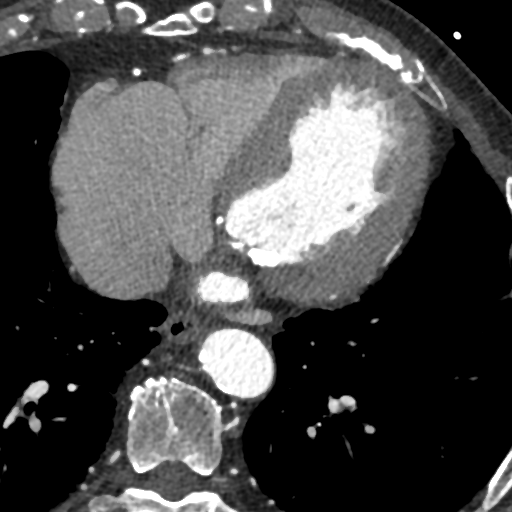
[im 806/4030  lung]
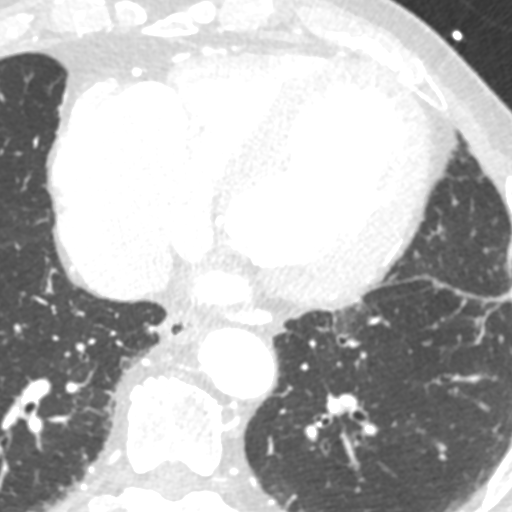
[im 1612/4030  vessel]
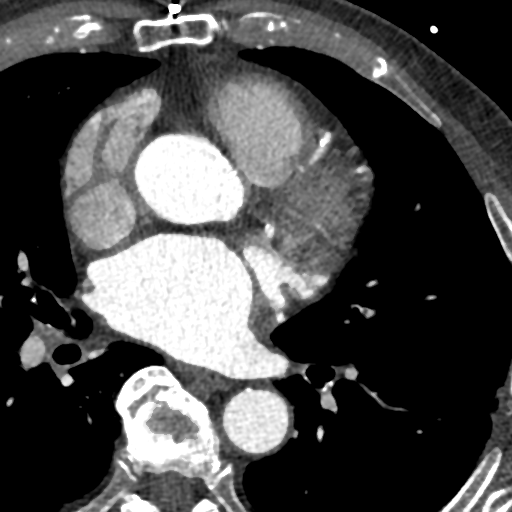
[im 2418/4030  vessel]
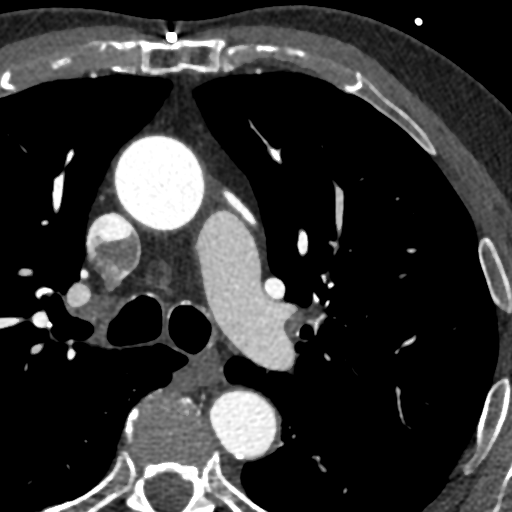
[im 3224/4030  vessel]
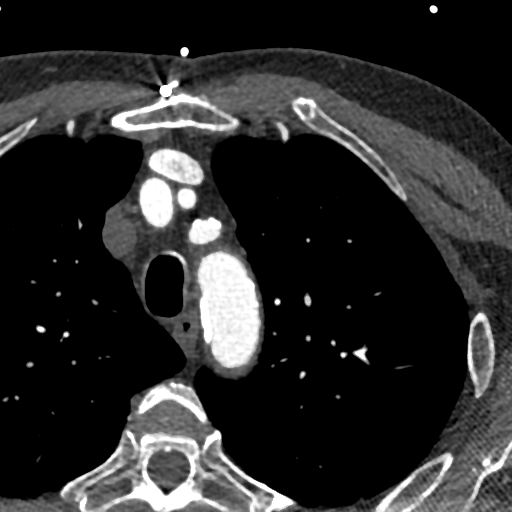

[7 of 20 positions shown; findings below may reference images not displayed]

FINDINGS: Aortic Valve: There is a 23 mm Mitroflow stented pericardial
bioprosthetic in place. Sewing ring appears intact with no areas of
dehiscence The leaflets are thickened and there appears to be
prolapse of the right coronary cusp.

Aorta: Moderate calcific atherosclerosis No aneurysm. Normal arch
vessels

WHEELIADOR Junction: 29 mm

Ascending Thoracic Aorta: 36 mm

Aortic Arch: 29 mm

Descending Thoracic Aorta: 24 mm

Sinus of Valsalva Measurements:

Non-coronary: 29.4 mm

Right - coronary: 28 mm

Left -   coronary: 30.6 mm

Coronary Artery Height above Annulus:

Left Main: 10.6 mm above annulus

Right Coronary: 10.9 mm above annulus

Valve ID: 19.2 mm

Area inside sewing ring 334 mm2

There is a patent WHEELIADOR to the OM branch. The patient has had a mitral
valve repair with annuloplasty ring. The WHEELIADOR was supposed to have
been ligated but appears patent with no obvious thrombus
IMPRESSION: 1. 23 mm Mitroflow stented pericardial tissue valve with thickened
leaflets and possible prolapse of right cusp.

2. On WHEELIADOR basal view LM is 7.3 mm from valve leaflet border and RCA
is 7.4 mm away However there is concern for the coronary heights
Even a 20 mm Sapein 3 is 14 mm in height

The RCA appears to rest behind the right leaflet with STJ calcium
and may be at higher risk of occlusion

3.  Normal aortic root 3.6 cm

4.  Post mitral valve repair with annuloplasty ring

5.  WHEELIADOR is patent Surgical note indicates ligation

6.  Patent WHEELIADOR to WHEELIADOR

WHEELIADOR

EXAM:
OVER-READ INTERPRETATION  CT CHEST

The following report is an over-read performed by radiologist Dr.
does not include interpretation of cardiac or coronary anatomy or
pathology. The cardiac CTA interpretation by the cardiologist is
attached.
FINDINGS: Please see the separate concurrent chest CT angiogram report for
details.
IMPRESSION: Please see the separate concurrent chest CT angiogram report for
details.

*** End of Addendum ***
FINDINGS: Aortic Valve: There is a 23 mm Mitroflow stented pericardial
bioprosthetic in place. Sewing ring appears intact with no areas of
dehiscence The leaflets are thickened and there appears to be
prolapse of the right coronary cusp.

Aorta: Moderate calcific atherosclerosis No aneurysm. Normal arch
vessels

WHEELIADOR Junction: 29 mm

Ascending Thoracic Aorta: 36 mm

Aortic Arch: 29 mm

Descending Thoracic Aorta: 24 mm

Sinus of Valsalva Measurements:

Non-coronary: 29.4 mm

Right - coronary: 28 mm

Left -   coronary: 30.6 mm

Coronary Artery Height above Annulus:

Left Main: 10.6 mm above annulus

Right Coronary: 10.9 mm above annulus

Valve ID: 19.2 mm

Area inside sewing ring 334 mm2

There is a patent WHEELIADOR to the OM branch. The patient has had a mitral
valve repair with annuloplasty ring. The WHEELIADOR was supposed to have
been ligated but appears patent with no obvious thrombus
IMPRESSION: 1. 23 mm Mitroflow stented pericardial tissue valve with thickened
leaflets and possible prolapse of right cusp.

2. On WHEELIADOR basal view LM is 7.3 mm from valve leaflet border and RCA
is 7.4 mm away However there is concern for the coronary heights
Even a 20 mm Sapein 3 is 14 mm in height

The RCA appears to rest behind the right leaflet with STJ calcium
and may be at higher risk of occlusion

3.  Normal aortic root 3.6 cm

4.  Post mitral valve repair with annuloplasty ring

5.  WHEELIADOR is patent Surgical note indicates ligation

6.  Patent WHEELIADOR to WHEELIADOR

WHEELIADOR

## 2018-11-03 MED ORDER — IOHEXOL 350 MG/ML SOLN
125.0000 mL | Freq: Once | INTRAVENOUS | Status: AC | PRN
  Administered 2018-11-03: 125 mL via INTRAVENOUS

## 2018-11-03 NOTE — Therapy (Signed)
[place], [name], [address] Phone: [phone]   Fax:  [phone]  Physical Therapy Evaluation/Pre-TAVR  Patient Details  Name: Eric [name] MRN: [id number] Date of Birth: 08-12-1929 Referring Provider (PT): [name], MD   Encounter Date: 11/03/2018  PT End of Session - 11/03/18 1247    Visit Number  1    PT Start Time  1240    PT Stop Time  1315    PT Time Calculation (min)  35 min    Activity Tolerance  Patient tolerated treatment well    Behavior During Therapy  [facility] for tasks assessed/performed       Past Medical History:  Diagnosis Date  . Atrial flutter, paroxysmal ([place])    a. dx 04-02-2014, s/p  successful cardioversion 04-06-2014.  . Bladder tumor   . Chronic anticoagulation    on Eliquis--  due to recurrent dvt's and pe's  . Dyslipidemia   . History of bladder cancer urologist-  dr [name]   02-13-2016  s/p TURBT per path high grade papillary urothelial carcinoma  . History of DVT (deep vein thrombosis)    2000-- RLE  . History of melanoma excision    left flank;  07/ 2014 left lower leg and right upper chest  . History of prostate cancer    Gleason 6--  s/p  radical prostatectomy 06/ 2000 in [place]---  no recurrence  . History of pulmonary embolus (PE)    2000 & 2005  bilateral  . Hx of valvuloplasty 06/19/2007   a. s/p mitral ring annuloplasty, repair ruptured chordae of P2 flail segments of MV  . S/P aortic valve replacement with bioprosthetic valve    06-19-2007  severe aortic valve stenosis  . Single vessel coronary artery disease   cardiologist-  dr Irish Lack   a. 05/2007 CABG x 1: s/p SVG-OM;  b. 10/2010 Ex MV: EF 72%, inf attenuation w/o ischemia, brief run of PAT with exercise. To  . Thrombocytopenia ([place])   . Thyroid goiter 2013   nodular  . Wears hearing aid    BILATERAL  . Wears partial dentures    UPPER    Past Surgical History:  Procedure Laterality  Date  . AORTIC VALVE REPLACEMENT (AVR)/CORONARY ARTERY BYPASS GRAFTING (CABG)  06-19-2007  dr [name]   SVG to OM1 ;   AVR w/ #23 Mitralflow pericardial and ligation left atrial appendage;  MV repair w/ #28 Sorin 3-D memo Ring Annuloplasty with repair ruptured chordar of P-2 flail segments  . CARDIAC CATHETERIZATION  05-12-2007  dr Leonia Reeves   single vessel 30% ostial LAD,  80% LCx;  severe to critial AS,  moderate MR,  normal LVF, ef 70%,  normal right heart pressures and cardiac outputs,  mild elevated LV end-diastolic pressure    . CARDIOVASCULAR STRESS TEST  11-02-2010  dr Irish Lack   normal nuclear study w/ no ischemia or infarct/scar/  normal LV function and wall motion , ef 72%  . CARDIOVERSION N/A 04/06/2014   Procedure: CARDIOVERSION;  Surgeon: [name], MD;  Location: [facility];  Service: Cardiovascular;  Laterality: N/A;   successful  . CATARACT EXTRACTION W/ INTRAOCULAR LENS  IMPLANT, BILATERAL  2012  approx  . RETROPUBIC RADICAL PROSTATECTOMY  06/ 2000  in [place], [place]  . RIGHT/LEFT HEART CATH AND CORONARY/GRAFT ANGIOGRAPHY N/A 10/29/2018   Procedure: RIGHT/LEFT HEART CATH AND CORONARY/GRAFT ANGIOGRAPHY;  Surgeon: [name], MD;  Location: [facility];  Service: Cardiovascular;  Laterality: N/A;  . ROTATOR CUFF REPAIR Left 09/1998  . TEE WITHOUT CARDIOVERSION N/A 04/06/2014   Procedure: TRANSESOPHAGEAL ECHOCARDIOGRAM (TEE);  Surgeon: [name], MD;  Location: [facility] ENDOSCOPY;  Service: Cardiovascular;  Laterality: N/A;  mild focal basa LVH of the septum, ef 50-55%, s/p AV annuloplasty ring, elongated chordae, thickened leaflets, mild to mod. MR, multiple small jets/arotic bioprosthetic valve sits well in position, mild AI/ thickened TV w/ mild reurg./mild LA  . TEE WITHOUT CARDIOVERSION N/A 10/22/2018   Procedure: TRANSESOPHAGEAL ECHOCARDIOGRAM (TEE);  Surgeon: [name], MD;  Location: [facility] ENDOSCOPY;  Service: Cardiovascular;  Laterality: N/A;  .  TRANSTHORACIC ECHOCARDIOGRAM  03-23-2016   dr Irish Lack   EF 55-60%, reduced contribution atrial contraction to ventricular filling, due to increased ventricular diastolic pressure or atrial contratile dysfunction/  bioprosthetic AV w/ mod. regurg. (valve area 1.97cm^2)/ mild dilated ascending aorta/ mild thicken MV normal function annular ring prosthesis/severe LAE & mod. to sev.RAE/ PASP 21mmHg/ mild TR/ ventricle septal motion show paradox  . TRANSURETHRAL RESECTION OF BLADDER TUMOR N/A 02/13/2016   Procedure: TRANSURETHRAL RESECTION OF BLADDER TUMOR (TURBT);  Surgeon: Nickie Retort, MD;  Location: [facility];  Service: Urology;  Laterality: N/A;  . TRANSURETHRAL RESECTION OF BLADDER TUMOR N/A 09/04/2016   Procedure: TRANSURETHRAL RESECTION OF BLADDER TUMOR (TURBT);  Surgeon: Nickie Retort, MD;  Location: [facility];  Service: Urology;  Laterality: N/A;    There were no vitals filed for this visit.   Subjective Assessment - 11/03/18 1242    Subjective  Last November, we went to [place] for thanksgiving. I walked up the stairs 3 floors and was SOB. Breathing became harder after dinner and went to hospital. Was worked up by cardiac team and found Afib and leakage. Gets SOB after taking a shower.    Currently in Pain?  No/denies         [facility] PT Assessment - 11/03/18 0001      Assessment   Medical Diagnosis  severe aortic insufficiency    Referring Provider (PT)  [name], MD    Onset Date/Surgical Date  --   Nov 2019   Hand Dominance  Right      Precautions   Precautions  None      Restrictions   Weight Bearing Restrictions  No      Balance Screen   Has the patient fallen in the past 6 months  No      [place] residence    Living Arrangements  Spouse/significant other    Additional Comments  stairs at home      Prior Function   Level of [place]  Retired       Associate Professor   Overall Cognitive Status  Within Functional Limits for tasks assessed      Sensation   Additional Comments  [facility]      Posture/Postural Control   Posture Comments  rounded shoulders, forward head, flexed posture, Le turnout in static stance      ROM / Strength   AROM / PROM / Strength  AROM;Strength      AROM   Overall AROM Comments  WFL      Strength   Overall Strength Comments  gross 5/5    Strength Assessment Site  Hand    Right/Left hand  Right;Left    Right Hand Grip (lbs)  55    Left Hand Grip (lbs)  65       [facility] Pre-Surgical Assessment - 11/03/18 0001    5 Meter Walk Test- trial 1  4 sec    5 Meter Walk Test- trial 2  3 sec.     5 Meter Walk Test- trial 3  3 sec.    5 meter walk test average  3.33 sec    4 Stage Balance Test Position  1    Sit To Stand Test- trial 1  14 sec.    6 Minute Walk- Baseline  yes    BP (mmHg)  179/47    HR (bpm)  54    02 Sat (%RA)  99 %    Modified Borg Scale for Dyspnea  0- Nothing at all    Perceived Rate of Exertion (Borg)  6-    6 Minute Walk Post Test  yes    BP (mmHg)  169/59    HR (bpm)  73    02 Sat (%RA)  96 %    Modified Borg Scale for Dyspnea  2- Mild shortness of breath    Perceived Rate of Exertion (Borg)  11- Fairly light    Aerobic Endurance Distance Walked  1029    Endurance additional comments  25% disability compared to age related norm              Objective measurements completed on examination: See above findings.                           Plan - 11/03/18 1316    PT Frequency  --   one time TAVR evaluation     Clinical Impression Statement: Pt is a 86 yo M presenting to OP PT for evaluation prior to possible TAVR surgery due to severe aortic stenosis. Pt reports onset of SOB 8 months ago. Symptoms are  limiting functional activities and ambulation such as stairs. Pt presents with WFL ROM and strength and denies musculoskeletal pain.  Pt ambulated a total of 1029  feet in 6 minute walk and reported 2/10 SOB on modified scale for dyspena and 11/20 RPE on Borg's perceived exertion and pain scale at the end of the walk. During the 6 minute walk test, patient's HR increased to 89 BPM and O2 saturation decreased to 94%. Based on the Short Physical Performance Battery, patient has a frailty rating of 7/12 with </= 5/12 considered frail.  Visit Diagnosis: Difficulty in walking, not elsewhere classified     Problem List Patient Active Problem List   Diagnosis Date Noted  . Nonrheumatic aortic valve insufficiency 04/23/2018  . Acute on chronic diastolic heart failure ([place]) 04/23/2018  . History of aortic valve replacement 04/23/2018  . Atrial fibrillation ([place]) 04/23/2018  . Travel advice encounter 04/30/2014  . Atrial flutter ([place])   . Coronary atherosclerosis of native coronary artery 02/25/2014  . Hyperlipidemia 02/25/2014  . Pulmonary embolism ([place]) 02/25/2014  . Mitral valve disorder 02/25/2014  . Heart valve replaced 02/25/2014  . Aortic stenosis   . Other pulmonary embolism and infarction 03/11/2013  . Embolism and thrombosis ([place]) 03/11/2013    Maquita Sandoval C. Lylee Corrow PT, DPT 11/03/18 1:18 PM   [facility] [address] [place], [name], [address] Phone: [phone]   Fax:  [phone]  Name: Eric [name] MRN: [id number] Date of Birth: Dec 28, 1929

## 2018-11-17 ENCOUNTER — Other Ambulatory Visit: Payer: Self-pay

## 2018-11-17 ENCOUNTER — Telehealth: Payer: Medicare Other | Admitting: Interventional Cardiology

## 2018-11-17 DIAGNOSIS — I351 Nonrheumatic aortic (valve) insufficiency: Secondary | ICD-10-CM

## 2018-11-18 ENCOUNTER — Institutional Professional Consult (permissible substitution): Payer: Medicare Other | Admitting: Surgery

## 2018-11-18 ENCOUNTER — Encounter: Payer: Self-pay | Admitting: Surgery

## 2018-11-18 VITALS — BP 158/61 | HR 72 | Temp 97.7°F | Resp 16 | Ht 72.0 in | Wt 187.0 lb

## 2018-11-18 DIAGNOSIS — I350 Nonrheumatic aortic (valve) stenosis: Secondary | ICD-10-CM

## 2018-11-18 NOTE — Progress Notes (Signed)
Patient ID: Eric [name], male   DOB: 12-28-1929, 85 y.o.   MRN: [id number]  [facility] CONSULTATION REPORT  Referring Provider is Jettie Booze, MD Primary Cardiologist is [name], MD PCP is [name], MD  Chief Complaint  Patient presents with   Aortic Stenosis    Surgical eval for TAVR, review all studies    HPI:  The patient is an 85 year old gentleman with a history of aortic valve replacement using a 23 mm Mitroflow pericardial valve, mitral valve repair with annuloplasty and single-vessel coronary bypass in 2009 by me.  He has a history of paroxysmal atrial fib/flutter and recurrent DVT and PE treated with Eliquis, prostate cancer status post radical prostatectomy in 2000, and bladder cancer status post TURBT in 2017.  He has been followed over the years by Dr. Irish Lack and is done well until he was admitted to an outside hospital in [place] in 03/2018 for atrial fibrillation and heart failure.  He was noted to have moderate central aortic insufficiency by echo at that time.  Since that time he has been placed back on Lasix but has continued to have progressive exertional shortness of breath and fatigue.  Lab work done in 07/2018 showed the possibility of low-grade hemolysis and a TEE was considered but postponed due to the coronavirus pandemic.  He eventually had a TEE performed on 10/22/2018 which showed severe prosthetic aortic insufficiency.  The aortic valve mean gradient was 15 mmHg with a peak gradient of 27 mmHg.  There was trivial MR.  There is moderate to severe TR.  Left ventricular ejection fraction was 50 to 55%.  He was seen by Dr. [name] for consideration of transcatheter aortic valve placement and underwent cardiac catheterization on 10/29/2018.  This showed single-vessel coronary disease with a severe stenosis of the proximal left circumflex and continued patency  of the saphenous vein graft to the first OM.  His coronaries were patent otherwise.  Mean gradient across aortic valve was 10 mmHg.  Right heart pressures were normal.  LVEDP was 10 mmHg.  The patient is here today with his wife.  He is retired and they live in [place] after moving from [place] about 20 years ago.  He has a daughter in [place] and his son in [place].  He reports progressive exertional fatigue and shortness of breath.  He denies any chest pain or pressure.  He has had no orthopnea or PND.  Denies dizziness and syncope.  He has chronic lower extremity edema.  Past Medical History:  Diagnosis Date   Atrial flutter, paroxysmal ([place])    a. dx 04-02-2014, s/p  successful cardioversion 04-06-2014.   Bladder tumor    Chronic anticoagulation    on Eliquis--  due to recurrent dvt's and pe's   Dyslipidemia    History of bladder cancer urologist-  dr [name]   02-13-2016  s/p TURBT per path high grade papillary urothelial carcinoma   History of DVT (deep vein thrombosis)    2000-- RLE   History of melanoma excision    left flank;  07/ 2014 left lower leg and right upper chest   History of prostate cancer    Gleason 6--  s/p  radical prostatectomy 06/ 2000 in [place]---  no recurrence   History of pulmonary embolus (PE)    2000 & 2005  bilateral   Hx of valvuloplasty 06/19/2007   a. s/p mitral ring  annuloplasty, repair ruptured chordae of P2 flail segments of MV   S/P aortic valve replacement with bioprosthetic valve    06-19-2007  severe aortic valve stenosis   Single vessel coronary artery disease   cardiologist-  dr Irish Lack   a. 05/2007 CABG x 1: s/p SVG-OM;  b. 10/2010 Ex MV: EF 72%, inf attenuation w/o ischemia, brief run of PAT with exercise. To   Thrombocytopenia ([place])    Thyroid goiter 2013   nodular   Wears hearing aid    BILATERAL   Wears partial dentures    UPPER    Past Surgical History:  Procedure Laterality Date   AORTIC  VALVE REPLACEMENT (AVR)/CORONARY ARTERY BYPASS GRAFTING (CABG)  06-19-2007  dr [name]   SVG to OM1 ;   AVR w/ #23 Mitralflow pericardial and ligation left atrial appendage;  MV repair w/ #28 Sorin 3-D memo Ring Annuloplasty with repair ruptured chordar of P-2 flail segments   CARDIAC CATHETERIZATION  05-12-2007  dr Leonia Reeves   single vessel 30% ostial LAD,  80% LCx;  severe to critial AS,  moderate MR,  normal LVF, ef 70%,  normal right heart pressures and cardiac outputs,  mild elevated LV end-diastolic pressure     CARDIOVASCULAR STRESS TEST  11-02-2010  dr Irish Lack   normal nuclear study w/ no ischemia or infarct/scar/  normal LV function and wall motion , ef 72%   CARDIOVERSION N/A 04/06/2014   Procedure: CARDIOVERSION;  Surgeon: [name], MD;  Location: [facility];  Service: Cardiovascular;  Laterality: N/A;   successful   CATARACT EXTRACTION W/ INTRAOCULAR LENS  IMPLANT, BILATERAL  2012  approx   RETROPUBIC RADICAL PROSTATECTOMY  06/ 2000  in [place], [place]   RIGHT/LEFT HEART CATH AND CORONARY/GRAFT ANGIOGRAPHY N/A 10/29/2018   Procedure: RIGHT/LEFT HEART CATH AND CORONARY/GRAFT ANGIOGRAPHY;  Surgeon: [name], MD;  Location: [facility];  Service: Cardiovascular;  Laterality: N/A;   ROTATOR CUFF REPAIR Left 09/1998   TEE WITHOUT CARDIOVERSION N/A 04/06/2014   Procedure: TRANSESOPHAGEAL ECHOCARDIOGRAM (TEE);  Surgeon: [name], MD;  Location: [facility] END[facility]COPY;  Service: Cardiovascular;  Laterality: N/A;  mild focal basa LVH of the septum, ef 50-55%, s/p AV annuloplasty ring, elongated chordae, thickened leaflets, mild to mod. MR, multiple small jets/arotic bioprosthetic valve sits well in position, mild AI/ thickened TV w/ mild reurg./mild LA   TEE WITHOUT CARDIOVERSION N/A 10/22/2018   Procedure: TRANSESOPHAGEAL ECHOCARDIOGRAM (TEE);  Surgeon: [name], MD;  Location: [facility] ENDOSCOPY;  Service: Cardiovascular;  Laterality: N/A;   TRANSTHORACIC ECHOCARDIOGRAM   03-23-2016   dr Irish Lack   EF 55-60%, reduced contribution atrial contraction to ventricular filling, due to increased ventricular diastolic pressure or atrial contratile dysfunction/  bioprosthetic AV w/ mod. regurg. (valve area 1.97cm^2)/ mild dilated ascending aorta/ mild thicken MV normal function annular ring prosthesis/severe LAE & mod. to sev.RAE/ PASP 35mmHg/ mild TR/ ventricle septal motion show paradox   TRANSURETHRAL RESECTION OF BLADDER TUMOR N/A 02/13/2016   Procedure: TRANSURETHRAL RESECTION OF BLADDER TUMOR (TURBT);  Surgeon: Nickie Retort, MD;  Location: [facility];  Service: Urology;  Laterality: N/A;   TRANSURETHRAL RESECTION OF BLADDER TUMOR N/A 09/04/2016   Procedure: TRANSURETHRAL RESECTION OF BLADDER TUMOR (TURBT);  Surgeon: Nickie Retort, MD;  Location: [facility];  Service: Urology;  Laterality: N/A;    Family History  Problem Relation Age of Onset   Leukemia Mother    Heart attack Neg Hx    Stroke Neg Hx  Hypertension Neg Hx     Social History   Socioeconomic History   Marital status: Married    Spouse name: Not on file   Number of children: Not on file   Years of education: Not on file   Highest education level: Not on file  Occupational History   Not on file  Social Needs   Financial resource strain: Not on file   Food insecurity    Worry: Not on file    Inability: Not on file   Transportation needs    Medical: Not on file    Non-medical: Not on file  Tobacco Use   Smoking status: Never Smoker   Smokeless tobacco: Never Used  Substance and Sexual Activity   Alcohol use: Yes    Alcohol/week: 1.0 - 2.0 standard drinks    Types: 1 - 2 Standard drinks or equivalent per week   Drug use: No   Sexual activity: Not on file  Lifestyle   Physical activity    Days per week: Not on file    Minutes per session: Not on file   Stress: Not on file  Relationships   Social connections    Talks  on phone: Not on file    Gets together: Not on file    Attends religious service: Not on file    Active member of club or organization: Not on file    Attends meetings of clubs or organizations: Not on file    Relationship status: Not on file   Intimate partner violence    Fear of current or ex partner: Not on file    Emotionally abused: Not on file    Physically abused: Not on file    Forced sexual activity: Not on file  Other Topics Concern   Not on file  Social History Narrative   Lives in [place] with wife.  Active.    Current Outpatient Medications  Medication Sig Dispense Refill   atorvastatin (LIPITOR) 40 MG tablet TAKE 1 TABLET EVERY EVENING 90 tablet 2   ELIQUIS 5 MG TABS tablet TAKE 1 TABLET TWICE A DAY 180 tablet 2   ezetimibe (ZETIA) 10 MG tablet Take 1 tablet (10 mg total) by mouth every evening. 90 tablet 3   furosemide (LASIX) 20 MG tablet Take 40 mg by mouth daily. Pt may take additional doses if needed for leg swelling     metoprolol tartrate (LOPRESSOR) 25 MG tablet Take 0.5 tablets (12.5 mg total) by mouth 2 (two) times daily. 90 tablet 3   No current facility-administered medications for this visit.     No Known Allergies    Review of Systems:   General:  normal appetite, + decreased energy, no weight gain, no weight loss, no fever  Cardiac:  no chest pain with exertion, no chest pain at rest, +SOB with  exertion, no resting SOB, no PND, no orthopnea, no palpitations, + arrhythmia, + atrial fibrillation, + LE edema, no dizzy spells, no syncope  Respiratory:  + shortness of breath, no home oxygen, no productive cough, no dry cough, no bronchitis, no wheezing, no hemoptysis, no asthma, no pain with inspiration or cough, no sleep apnea, no CPAP at night  GI:   no difficulty swallowing, no reflux, no frequent heartburn, no hiatal hernia, no abdominal pain, no constipation, no diarrhea, no hematochezia, no hematemesis, no melena  GU:   no dysuria,  no  frequency, no urinary tract infection, + hematuria in past, not now, no enlarged prostate, no  kidney stones, no kidney disease  Vascular:  no pain suggestive of claudication, no pain in feet, no leg cramps, no varicose veins, no DVT, no non-healing foot ulcer  Neuro:   no stroke, no TIA's, no seizures, no headaches, no temporary blindness one eye,  no slurred speech, no peripheral neuropathy, no chronic pain, no instability of gait, no memory/cognitive dysfunction  Musculoskeletal: no arthritis, no joint swelling, no myalgias, no difficulty walking, normal mobility   Skin:   no rash, no itching, no skin infections, no pressure sores or ulcerations  Psych:   no anxiety, no depression, no nervousness, no unusual recent stress  Eyes:   no blurry vision, no floaters, no recent vision changes, + wears glasses or contacts  ENT:   no hearing loss, no loose or painful teeth, no dentures, last saw dentist this year  Hematologic:  + easy bruising, no abnormal bleeding, no clotting disorder, no frequent epistaxis  Endocrine:  no diabetes, does not check CBG's at home       Physical Exam:   Ht 6' (1.829 m)    Wt 187 lb (84.8 kg)    BMI 25.36 kg/m   General:  Elderly but  well-appearing  HEENT:  Unremarkable, NCAT, PERLA, EOMI, oropharynx clear, teeth in good condition  Neck:   no JVD, no bruits, no adenopathy or thyromegaly  Chest:   clear to auscultation, symmetrical breath sounds, no wheezes, no rhonchi   CV:   RRR, grade II/VI crescendo/decrescendo murmur heard best at RSB, lll/Vl  diastolic murmur at apex  Abdomen:  soft, non-tender, no masses or organomegaly  Extremities:  warm, well-perfused, pulses palpable in feet, mild LE edema  Rectal/GU  Deferred  Neuro:   Grossly non-focal and symmetrical throughout  Skin:   Clean and dry, no rashes, no breakdown   Diagnostic Tests:   TRANSESOPHOGEAL ECHO REPORT       Patient Name:   Eric [name] Date of Exam: 10/22/2018 Medical Rec #:   [id number]       Height:       72.0 in Accession #:    [id number]      Weight:       187.0 lb Date of Birth:  [birth year]       BSA:          2.07 m Patient Age:    11 years        BP:           126/49 mmHg Patient Gender: M               HR:           74 bpm. Exam Location:  Inpatient    Procedure: Transesophageal Echo, Color Doppler, Cardiac Doppler and 3D Echo  Indications:     I35.1 Nonrheumatic aortic (valve) insufficiency   History:         Patient has prior history of Echocardiogram examinations. Prior                  CABG Signs/Symptoms: Shortness of Breath. Aortic Valve: A 28                  Mitralflow porcine aortic valve bioprosthesis   Sonographer:     Darlina Sicilian RDCS Referring Phys:  [place] Diagnosing Phys: Candee Furbish MD     PROCEDURE: Local oropharyngeal anesthetic was provided with viscous lidocaine. The transesophogeal probe was passed through the esophogus of the patient. The  patient developed no complications during the procedure.  IMPRESSIONS    1. The left ventricle has low normal systolic function, with an ejection fraction of 50-55%. Left ventricular diastolic function could not be evaluated. No evidence of left ventricular regional wall motion abnormalities.  2. The right ventricle has normal systolc function. The cavity was normal. There is no increase in right ventricular wall thickness.  3. Left atrial size was mildly dilated.  4. Right atrial size was mildly dilated.  5. Moderately thickened tricuspid valve leaflets.  6. 81mm Sorin mitral valve repair annular ring intact.  7. 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  8. A 28 Mitralflow porcine bioprosthesis valve is present in the aortic position. Echo findings are consistent with intravalvular regurgitation of the aortic prosthesis.  9. Aortic valve regurgitation is severe by color flow Doppler. Mild stenosis of the aortic  valve. 10. No vegetation on the aortic valve. 11. The aortic root is normal in size and structure. 12. Previously ligated left atrial appendage. 13. Severe bioprosthetic aortic valve regurgitation.  FINDINGS  Left Ventricle: The left ventricle has low normal systolic function, with an ejection fraction of 50-55%. There is no increase in left ventricular wall thickness. Left ventricular diastolic function could not be evaluated. No evidence of left  ventricular regional wall motion abnormalities.  Right Ventricle: The right ventricle has normal systolic function. The cavity was normal. There is no increase in right ventricular wall thickness.  Left Atrium: Left atrial size was mildly dilated.   Right Atrium: Right atrial size was mildly dilated. Right atrial pressure is estimated at 10 mmHg.  Interatrial Septum: No atrial level shunt detected by color flow Doppler.  Pericardium: There is no evidence of pericardial effusion.  Mitral Valve: The mitral valve has been repaired/replaced. Mitral valve regurgitation is mild by color flow Doppler. 48mm Sorin mitral valve repair annular ring intact.  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 Tricuspid valve regurgitation is moderate-severe by color flow Doppler. The tricuspid valve is moderately thickened.  Aortic Valve: The aortic valve has been repaired/replaced Aortic valve regurgitation is severe by color flow Doppler. There is Mild stenosis of the aortic valve. A 28 Mitralflow porcine aortic valve bioprosthesis valve is present in the aortic position.  Echo findings are consistent with intravalvular regurgitation of the aortic prosthesis. There is no evidence of a vegetation on the aortic valve.  Pulmonic Valve: The pulmonic valve was grossly normal. Pulmonic valve regurgitation is not visualized by color flow Doppler.  Aorta: The aortic root is normal in size and structure.  Additional Findings: Severe  bioprosthetic aortic valve regurgitation. Previously ligated left atrial appendage.    +-------------+------------++  AORTIC VALVE                 +-------------+------------++  AV Vmax:      260.00 cm/s    +-------------+------------++  AV Vmean:     181.000 cm/s   +-------------+------------++  AV VTI:       0.569 m        +-------------+------------++  AV Peak Grad: 27.0 mmHg      +-------------+------------++  AV Mean Grad: 15.0 mmHg      +-------------+------------++  AR PHT:       303 msec       +-------------+------------++  +-------------+---------++ +---------------+-----------++  MITRAL VALVE               TRICUSPID VALVE               +-------------+---------++ +---------------+-----------++  MV Peak grad: 8.4 mmHg     TR Peak grad:   26.6 mmHg     +-------------+---------++ +---------------+-----------++  MV Mean grad: 3.0 mmHg     TR Vmax:        258.00 cm/s   +-------------+---------++ +---------------+-----------++  MV Vmax:      1.45 m/s    +-------------+---------++  MV Vmean:     66.7 cm/s   +-------------+---------++  MV VTI:       0.24 m      +-------------+---------++    Candee Furbish MD Electronically signed by Candee Furbish MD Signature Date/Time: 10/22/2018/2:41:47 PM   Physicians  Panel Physicians Referring Physician Case Authorizing Physician  [name], MD (Primary)    Procedures  RIGHT/LEFT HEART CATH AND CORONARY/GRAFT ANGIOGRAPHY  Conclusion  1.  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  Single-vessel coronary artery disease with severe stenosis of the proximal left circumflex and continued patency of the saphenous vein graft to first OM 3.  Patent left main, LAD, and RCA with mild diffuse nonobstructive disease 4.  Normal right heart pressures and preserved cardiac output  Recommend:  Continued multidisciplinary team evaluation with CTA studies and formal surgical consultation with Dr. [name] in anticipation of possible valve-in-valve TAVR  Recommendations  Antiplatelet/Anticoag Recommend to resume Apixaban, at currently prescribed dose and frequency on 10/30/2018. Concurrent antiplatelet therapy not recommended.  Surgeon Notes    10/22/2018 9:58 AM CV Procedure signed by [name], MD  Indications  Severe aortic insufficiency [I35.1 (ICD-10-CM)]  Procedural Details  Technical Details INDICATION: 85 year old gentleman with history of bioprosthetic aortic valve replacement, mitral valve repair, and single-vessel coronary bypass with a saphenous vein graft to OM.  He presents with progressive shortness of breath and symptoms of congestive heart failure in the setting of severe bioprosthetic aortic valve insufficiency.  He is referred for right and left heart catheterization for further evaluation.  PROCEDURAL DETAILS: There was an indwelling IV in a right antecubital vein. Using normal sterile technique, the IV was changed out for a 5 Fr brachial sheath over a 0.018 inch wire. The right wrist was then prepped, draped, and anesthetized with 1% lidocaine. Using the modified Seldinger technique a 5/6 French Slender sheath was placed in the right radial artery. Intra-arterial verapamil was administered through the radial artery sheath. IV heparin was administered after a JR4 catheter was advanced into the central aorta. A Swan-Ganz catheter was used for the right heart catheterization. Standard protocol was followed for recording of right heart pressures and sampling of oxygen saturations. Fick cardiac output was calculated. Standard Judkins catheters were used for selective coronary angiography. LV pressure is recorded and an aortic valve pullback is performed.  Saphenous vein graft angiography is performed with an AL-1 catheter.  There were no immediate procedural complications. The  patient was transferred to the post catheterization recovery area for further monitoring.    Estimated blood loss <50 mL.   During this procedure medications were administered to achieve and maintain moderate conscious sedation while the patient's heart rate, blood pressure, and oxygen saturation were continuously monitored and I was present face-to-face 100% of this time.  Medications (Filter: Administrations occurring from 10/29/18 1513 to 10/29/18 1623) (important)  Continuous medications are totaled by the amount administered until 10/29/18 1623.  Medication Rate/Dose/Volume Action  Date Time   fentaNYL (SUBLIMAZE) injection (mcg) 25 mcg Given 10/29/18 1537  Total dose as of 10/29/18 1623        25 mcg        midazolam (VERSED) injection (mg) 1 mg Given 10/29/18 1537   Total dose as of 10/29/18 1623        1 mg        Heparin (Porcine) in NaCl 1000-0.9 UT/500ML-% SOLN (mL) 500 mL Given 10/29/18 1537   Total dose as of 10/29/18 1623 500 mL Given 1537   1,000 mL        lidocaine (PF) (XYLOCAINE) 1 % injection (mL) 2 mL Given 10/29/18 1538   Total dose as of 10/29/18 1623        2 mL        Radial Cocktail/Verapamil only (mL)  Given 10/29/18 1538   Total dose as of 10/29/18 1623        Cannot be calculated        iohexol (OMNIPAQUE) 350 MG/ML injection (mL) 45 mL Given 10/29/18 1614   Total dose as of 10/29/18 1623        45 mL        heparin injection (Units) 4,500 Units Given 10/29/18 1546   Total dose as of 10/29/18 1623        4,500 Units        Sedation Time  Sedation Time Physician-1: 34 minutes 2 seconds  Coronary Findings  Diagnostic Dominance: Right Left Main  Mid LM to Dist LM lesion 25% stenosed  Mid LM to Dist LM lesion is 25% stenosed. The lesion is mildly calcified.  Left Anterior Descending  Vessel is large. The vessel exhibits minimal luminal irregularities. The LAD is a large vessel that courses to the LV apex. The vessel has minor irregularities but no  significant stenoses. The first diagonal is large in caliber.  Left Circumflex  Prox Cx lesion 75% stenosed  Prox Cx lesion is 75% stenosed.  Right Coronary Artery  There is mild diffuse disease throughout the vessel.  Mid RCA lesion 40% stenosed  Mid RCA lesion is 40% stenosed. The lesion is moderately calcified.  saphenous Graft to 1st Mrg  SVG graft was visualized by angiography and is normal in caliber. The graft exhibits no disease. The saphenous vein graft to first OM is widely patent throughout. There are no significant stenoses. The graft fills the circumflex and retrograde fashion.  Intervention  No interventions have been documented. Coronary Diagrams  Diagnostic Dominance: Right  Intervention  Implants   No implant documentation for this case.  Syngo Images  Show images for CARDIAC CATHETERIZATION  Images on Long Term Storage  Show images for Zayed, Griffie "[name]"   Link to Procedure Log  Procedure Log    Hemo Data   Most Recent Value  Fick Cardiac Output 5.49 L/min  Fick Cardiac Output Index 2.65 (L/min)/BSA  Aortic Mean Gradient 9.56 mmHg  Aortic Peak Gradient 14 mmHg  Aortic Valve Area >3.50  Aortic Value Area Index 1.69 cm2/BSA  RA A Wave -99 mmHg  RA V Wave 7 mmHg  RA Mean 5 mmHg  RV Systolic Pressure 33 mmHg  RV Diastolic Pressure 0 mmHg  RV EDP 3 mmHg  PA Systolic Pressure 37 mmHg  PA Diastolic Pressure 13 mmHg  PA Mean 24 mmHg  PW A Wave -99 mmHg  PW V Wave 24 mmHg  PW Mean 16 mmHg  AO Systolic Pressure 062 mmHg  AO Diastolic Pressure 43 mmHg  AO Mean 78 mmHg  LV Systolic  Pressure 474 mmHg  LV Diastolic Pressure 3 mmHg  LV EDP 10 mmHg  AOp Systolic Pressure 259 mmHg  AOp Diastolic Pressure 40 mmHg  AOp Mean Pressure 73 mmHg  LVp Systolic Pressure 563 mmHg  LVp Diastolic Pressure 0 mmHg  LVp EDP Pressure 8 mmHg  QP/QS 1  TPVR Index 9.06 HRUI  TSVR Index 31.32 HRUI  PVR SVR Ratio 0.1  TPVR/TSVR Ratio 0.29    ADDENDUM REPORT:  11/03/2018 17:29  EXAM: OVER-READ INTERPRETATION  CT CHEST  The following report is an over-read performed by radiologist Dr. Samara Snide [facility] on 11/03/2018. This over-read does not include interpretation of cardiac or coronary anatomy or pathology. The cardiac CTA interpretation by the cardiologist is attached.  COMPARISON:  11/11/2006 chest CT angiogram.  FINDINGS: Please see the separate concurrent chest CT angiogram report for details.  IMPRESSION: Please see the separate concurrent chest CT angiogram report for details.   Electronically Signed   By: Ilona Sorrel M.D.   On: 11/03/2018 17:29   Addended by Sharyn Blitz, MD on 11/03/2018 5:32 PM    Study Result  CLINICAL DATA:  AVR Pre Valve in Valve TAVR  EXAM: Cardiac TAVR CT  TECHNIQUE: The patient was scanned on a Siemens Force 875 slice scanner. A 120 kV retrospective scan was triggered in the ascending thoracic aorta at 140 HU's. Gantry rotation speed was 250 msecs and collimation was .6 mm. No beta blockade or nitro were given. The 3D data set was reconstructed in 5% intervals of the R-R cycle. Systolic and diastolic phases were analyzed on a dedicated work station using MPR, MIP and VRT modes. The patient received 80 cc of contrast.  FINDINGS: Aortic Valve: There is a 23 mm Mitroflow stented pericardial bioprosthetic in place. Sewing ring appears intact with no areas of dehiscence The leaflets are thickened and there appears to be prolapse of the right coronary cusp.  Aorta: Moderate calcific atherosclerosis No aneurysm. Normal arch vessels  Sino-tubular Junction: 29 mm  Ascending Thoracic Aorta: 36 mm  Aortic Arch: 29 mm  Descending Thoracic Aorta: 24 mm  Sinus of Valsalva Measurements:  Non-coronary: 29.4 mm  Right - coronary: 28 mm  Left -   coronary: 30.6 mm  Coronary Artery Height above Annulus:  Left Main: 10.6 mm above  annulus  Right Coronary: 10.9 mm above annulus  Valve ID: 19.2 mm  Area inside sewing ring 334 mm2  There is a patent SVG to the OM branch. The patient has had a mitral valve repair with annuloplasty ring. The LAA was supposed to have been ligated but appears patent with no obvious thrombus  IMPRESSION: 1. 23 mm Mitroflow stented pericardial tissue valve with thickened leaflets and possible prolapse of right cusp.  2. On SA basal view LM is 7.3 mm from valve leaflet border and RCA is 7.4 mm away However there is concern for the coronary heights Even a 20 mm Sapein 3 is 14 mm in height  The RCA appears to rest behind the right leaflet with STJ calcium and may be at higher risk of occlusion  3.  Normal aortic root 3.6 cm  4.  Post mitral valve repair with annuloplasty ring  5.  LAA is patent Surgical note indicates ligation  6.  Patent SVG to OM  Jenkins Rouge  Electronically Signed: By: Jenkins Rouge M.D. On: 11/03/2018 15:16       CLINICAL DATA:  85 year old male with severe symptomatic aortic stenosis. Pre-TAVR evaluation.  EXAM: CT ANGIOGRAPHY CHEST, ABDOMEN AND PELVIS  TECHNIQUE: Multidetector CT imaging through the chest, abdomen and pelvis was performed using the standard protocol during bolus administration of intravenous contrast. Multiplanar reconstructed images and MIPs were obtained and reviewed to evaluate the vascular anatomy.  CONTRAST:  117mL OMNIPAQUE IOHEXOL 350 MG/ML SOLN  COMPARISON:  04/23/2018 chest radiograph. 01/17/2016 CT abdomen/pelvis. 11/11/2006 chest CT angiogram.  FINDINGS: CTA CHEST FINDINGS  Cardiovascular: Mild cardiomegaly. Thickened aortic valve with aortic and mitral annuloplasty rings in place. Three-vessel coronary atherosclerosis status post CABG with ascending aortic bypass graft to a circumflex branch. No significant pericardial effusion/thickening. Atherosclerotic nonaneurysmal thoracic  aorta noting mild atherosclerotic calcification in the ascending thoracic aorta. Top-normal caliber main pulmonary artery (3.2 cm diameter). No central pulmonary emboli.  Mediastinum/Nodes: Heterogeneously enhancing 3.1 cm right thyroid lobe nodule, not substantially changed in size since 2008 CT. Unremarkable esophagus. No pathologically enlarged axillary, mediastinal or hilar lymph nodes.  Lungs/Pleura: No pneumothorax. No pleural effusion. No acute consolidative airspace disease, lung masses or significant pulmonary nodules. Patchy mild subpleural reticulation and ground-glass attenuation throughout both lungs with a basilar predominance, without significant regions traction bronchiectasis or frank honeycombing, for which comparison to 2008 chest CT is limited by the presence of pleural effusions and atelectasis on the prior scan.  Musculoskeletal: No aggressive appearing focal osseous lesions. Intact sternotomy wires. Moderate thoracic spondylosis.  CTA ABDOMEN AND PELVIS FINDINGS  Hepatobiliary: Normal liver with no liver mass. Normal gallbladder with no radiopaque cholelithiasis. No biliary ductal dilatation.  Pancreas: Normal, with no mass or duct dilation.  Spleen: Normal size. No mass.  Adrenals/Urinary Tract: Normal adrenals. Normal kidneys with no hydronephrosis and no contour deforming renal mass. Nonspecific eccentric left bladder wall thickening. No significant bladder distention.  Stomach/Bowel: Normal non-distended stomach. Normal caliber small bowel with no small bowel wall thickening. Normal appendix. Moderate sigmoid diverticulosis, with no large bowel wall thickening or significant pericolonic stranding.  Vascular/Lymphatic: Abdominal aorta with ectatic 2.8 cm infrarenal abdominal aorta. Patent renal and splenic veins. No pathologically enlarged lymph nodes in the abdomen or pelvis.  Reproductive: Status post prostatectomy.  Other: No  pneumoperitoneum, ascites or focal fluid collection.  Musculoskeletal: No aggressive appearing focal osseous lesions. Moderate lumbar spondylosis  VASCULAR MEASUREMENTS PERTINENT TO TAVR:  AORTA:  Minimal Aortic Diameter-15.9 x 14.5 mm  Severity of Aortic Calcification-severe  RIGHT PELVIS:  Right Common Iliac Artery -  Minimal Diameter-10.8 x 10.5 mm  Tortuosity-moderate to severe  Calcification-mild  Right External Iliac Artery -  Minimal Diameter-9.1 x 8.8 mm  Tortuosity-severe  Calcification-minimal  Right Common Femoral Artery -  Minimal Diameter-9.7 x 6.1 mm  Tortuosity-mild  Calcification-severe  LEFT PELVIS:  Left Common Iliac Artery -  Minimal Diameter-14.1 x 8.6 mm  Tortuosity-severe  Calcification-mild-to-moderate  Left External Iliac Artery -  Minimal Diameter-9.1 x 8.9 mm  Tortuosity-mild-to-moderate  Calcification-minimal  Left Common Femoral Artery -  Minimal Diameter-8.9 x 7.3 mm  Tortuosity-mild  Calcification-moderate  Review of the MIP images confirms the above findings.  IMPRESSION: 1. Vascular findings and measurements pertinent to potential TAVR procedure, as detailed. 2. Thickening of the aortic valve, compatible with the reported history of severe aortic stenosis. Aortic and mitral annuloplasty rings in place. 3. Mild cardiomegaly. 4. Ectatic 2.8 cm infrarenal abdominal aorta, at risk for aneurysm development. Recommend follow-up aortic ultrasound in 5 years. This recommendation follows ACR consensus guidelines: White Paper of the ACR Incidental Findings Committee II on Vascular Findings. J Am Coll Radiol 2013; 10:789-794. 5. Right  thyroid lobe 3.1 cm nodule, not substantially changed in size since 2008 chest CT. 6. Nonspecific basilar predominant findings that may indicate an interstitial lung disease, without significant bronchiectasis or honeycombing. Pulmonology consultation  and follow-up high-resolution chest CT could be obtained in 3-6 months as clinically warranted. 7. Nonspecific eccentric left bladder wall thickening. 8. Moderate sigmoid diverticulosis. 9.  Aortic Atherosclerosis (ICD10-I70.0).   Electronically Signed   By: Ilona Sorrel M.D.   On: 11/03/2018 17:28  STS Risk Calculator: Procedure: Redo AV Replacement    Risk of Mortality:  6.201%   Renal Failure:  4.485%   Permanent Stroke:  1.924%   Prolonged Ventilation:  13.559%   DSW Infection:  0.158%   Reoperation:  4.700%   Morbidity or Mortality:  21.451%   Short Length of Stay:  18.476%   Long Length of Stay:  10.179%    Impression:  This 85 year old gentleman has severe prosthetic aortic valve insufficiency with New York Heart Association class II symptoms of exertional fatigue and shortness of breath consistent with chronic systolic congestive heart failure.  I have personally reviewed TEE, cardiac catheterization, and CTA studies.  TEE shows severe prosthetic aortic insufficiency with low normal left ventricular systolic function with ejection fraction of 50 to 55%.  There is mild mitral regurgitation.  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  Right ventricular systolic function is normal.  Cardiac catheterization shows severe single-vessel coronary disease involving the proximal left circumflex coronary artery with a patent saphenous vein graft to the first obtuse marginal branch.  The remainder of his coronary arteries are without significant disease.  His 23 mm Mitroflow aortic valve prosthesis is suitable for valve in valve transcatheter aortic valve replacement using a 23 mm Sapien 3 valve with fracturing of the valve ring of the Mitroflow prosthesis.  The right coronary artery does appear to sit behind the right leaflet of the Mitroflow prosthesis may be at increased risk for occlusion.  We may need to consider protection of the right coronary artery  with a stent.  His abdominal and pelvic CTA shows adequate pelvic vascular anatomy to allow transfemoral insertion.  The patient and his wife were counseled at length regarding treatment alternatives for management of severe symptomatic prosthetic valve insufficiency.  The risks and benefits of surgical intervention has been discussed in detail. Long-term prognosis with medical therapy was discussed. Alternative approaches such as conventional surgical aortic valve replacement, transcatheter aortic valve replacement, and palliative medical therapy were compared and contrasted at length. This discussion was placed in the context of the patient's own specific clinical presentation and past medical history. All of their questions have been addressed.   Following the decision to proceed with transcatheter aortic valve replacement, a discussion was held regarding what types of management strategies would be attempted intraoperatively in the event of life-threatening complications, including whether or not the patient would be considered a candidate for the use of cardiopulmonary bypass and/or conversion to open sternotomy for attempted surgical intervention. The patient is aware of the fact that transient use of cardiopulmonary bypass may be necessary but given his prior cardiac surgery I do not think he would be a candidate for emergent sternotomy to manage any intraoperative complications.  The patient has been advised of a variety of complications that might develop including but not limited to risks of death, stroke, paravalvular leak, aortic dissection or other major vascular complications, aortic annulus rupture, device embolization, cardiac rupture or perforation, mitral regurgitation, acute myocardial infarction,  arrhythmia, heart block or bradycardia requiring permanent pacemaker placement, congestive heart failure, respiratory failure, renal failure, pneumonia, infection, other late complications related  to structural valve deterioration or migration, or other complications that might ultimately cause a temporary or permanent loss of functional independence or other long term morbidity. The patient provides full informed consent for the procedure as described and all questions were answered.     Plan:  The patient will be scheduled for valve in valve transcatheter aortic valve replacement on Tuesday, 11/25/2018.  He has been instructed to discontinue his Eliquis after the dose on 11/19/2018.  I spent 60 minutes performing this consultation and > 50% of this time was spent face to face counseling and coordinating the care of this patient's severe symptomatic prosthetic aortic valve insufficiency.   [name], MD 11/18/2018

## 2018-11-19 NOTE — Progress Notes (Signed)
[id number] [place], [place] AT [place] [address] [name] [address] Phone: [phone] Fax: [phone]  [facility], [place] AT [facility] to Registered [employer] [facility] [place] [address] Phone: [phone] Fax: [phone]      Your procedure is scheduled on Tuesday, 11/25/2018.  Report to [facility] Main Entrance "A" at 08:45 A.M., and check in at the Admitting office.  Call this number if you have problems the morning of surgery:  [phone]  Call [phone] if you have any questions prior to your surgery date Monday-Friday 8am-4pm    Remember:  Do not eat or drink after midnight.    Take these medicines the morning of surgery with A SIP OF WATER: NONE   7 days prior to surgery STOP taking any Aspirin (unless otherwise instructed by your surgeon), Aleve, Naproxen, Ibuprofen, Motrin, Advil, Goody's, BC's, all herbal medications, fish oil, and all vitamins.  Follow Dr. Vivi Martens instructions on when to stop Eliquis.  If no instructions were given by your surgeon then you will need to call the office to get those instructions.       The Morning of Surgery  Do not wear jewelry.  Do not wear lotions, powders, or colognes, or deodorant  Men may shave face and neck.  Do not bring valuables to the hospital.  [facility] is not responsible for any belongings or valuables.  IF you are a smoker, DO NOT Smoke 24 hours prior to surgery  IF you wear a CPAP at night please bring your mask, tubing, and machine the morning of surgery   Remember that you must have someone to transport you home after your surgery, and remain with you for 24 hours if you are discharged the same day.   Contacts, eyeglasses, hearing aids, dentures or bridgework may not be worn into surgery.    Leave your suitcase in the car.  After surgery it may be  brought to your room.  For patients admitted to the hospital, discharge time will be determined by your treatment team.  Patients discharged the day of surgery will not be allowed to drive home.    Special instructions:   [place]- Preparing For Surgery  Before surgery, you can play an important role. Because skin is not sterile, your skin needs to be as free of germs as possible. You can reduce the number of germs on your skin by washing with CHG (chlorahexidine gluconate) Soap before surgery.  CHG is an antiseptic cleaner which kills germs and bonds with the skin to continue killing germs even after washing.    Oral Hygiene is also important to reduce your risk of infection.  Remember - BRUSH YOUR TEETH THE MORNING OF SURGERY WITH YOUR REGULAR TOOTHPASTE  Please do not use if you have an allergy to CHG or antibacterial soaps. If your skin becomes reddened/irritated stop using the CHG.  Do not shave (including legs and underarms) for at least 48 hours prior to first CHG shower. It is OK to shave your face.  Please follow these instructions carefully.   1. Shower the NIGHT BEFORE SURGERY and the MORNING OF SURGERY with CHG Soap.   2. If you chose to wash your hair, wash your hair first as usual with your normal shampoo.  3. After you shampoo, rinse your hair and body thoroughly to remove the shampoo.  4.  Use CHG as you would any other liquid soap. You can apply CHG directly to the skin and wash gently with a scrungie or a clean washcloth.   5. Apply the CHG Soap to your body ONLY FROM THE NECK DOWN.  Do not use on open wounds or open sores. Avoid contact with your eyes, ears, mouth and genitals (private parts). Wash Face and genitals (private parts)  with your normal soap.   6. Wash thoroughly, paying special attention to the area where your surgery will be performed.  7. Thoroughly rinse your body with warm water from the neck down.  8. DO NOT shower/wash with your normal soap  after using and rinsing off the CHG Soap.  9. Pat yourself dry with a CLEAN TOWEL.  10. Wear CLEAN PAJAMAS to bed the night before surgery, wear comfortable clothes the morning of surgery  11. Place CLEAN SHEETS on your bed the night of your first shower and DO NOT SLEEP WITH PETS.    Day of Surgery: Shower as stated above Do not apply any deodorants/lotions.  Please wear clean clothes to the hospital/surgery center.   Remember to brush your teeth WITH YOUR REGULAR TOOTHPASTE.   Please read over the following fact sheets that you were given.

## 2018-11-20 ENCOUNTER — Encounter (HOSPITAL_COMMUNITY): Payer: Self-pay

## 2018-11-20 ENCOUNTER — Ambulatory Visit (HOSPITAL_COMMUNITY)
Admission: RE | Admit: 2018-11-20 | Discharge: 2018-11-20 | Disposition: A | Discharge status: Home / Self Care | Payer: Medicare Other | Source: Ambulatory Visit | Attending: Cardiovascular Disease | Admitting: Cardiovascular Disease

## 2018-11-20 ENCOUNTER — Encounter (HOSPITAL_COMMUNITY)
Admission: RE | Admit: 2018-11-20 | Discharge: 2018-11-20 | Disposition: A | Discharge status: Home / Self Care | Payer: Medicare Other | Source: Ambulatory Visit | Attending: Cardiovascular Disease | Admitting: Cardiovascular Disease

## 2018-11-20 ENCOUNTER — Other Ambulatory Visit: Payer: Self-pay

## 2018-11-20 DIAGNOSIS — Z01818 Encounter for other preprocedural examination: Secondary | ICD-10-CM | POA: Insufficient documentation

## 2018-11-20 DIAGNOSIS — I2510 Atherosclerotic heart disease of native coronary artery without angina pectoris: Secondary | ICD-10-CM | POA: Insufficient documentation

## 2018-11-20 DIAGNOSIS — Z8551 Personal history of malignant neoplasm of bladder: Secondary | ICD-10-CM | POA: Diagnosis not present

## 2018-11-20 DIAGNOSIS — Z951 Presence of aortocoronary bypass graft: Secondary | ICD-10-CM | POA: Diagnosis not present

## 2018-11-20 DIAGNOSIS — I351 Nonrheumatic aortic (valve) insufficiency: Secondary | ICD-10-CM | POA: Insufficient documentation

## 2018-11-20 DIAGNOSIS — Z79899 Other long term (current) drug therapy: Secondary | ICD-10-CM | POA: Diagnosis not present

## 2018-11-20 DIAGNOSIS — Z952 Presence of prosthetic heart valve: Secondary | ICD-10-CM | POA: Diagnosis not present

## 2018-11-20 DIAGNOSIS — I350 Nonrheumatic aortic (valve) stenosis: Secondary | ICD-10-CM | POA: Diagnosis not present

## 2018-11-20 DIAGNOSIS — Z86711 Personal history of pulmonary embolism: Secondary | ICD-10-CM | POA: Diagnosis not present

## 2018-11-20 DIAGNOSIS — Z8546 Personal history of malignant neoplasm of prostate: Secondary | ICD-10-CM | POA: Diagnosis not present

## 2018-11-20 DIAGNOSIS — Z86718 Personal history of other venous thrombosis and embolism: Secondary | ICD-10-CM | POA: Insufficient documentation

## 2018-11-20 DIAGNOSIS — E785 Hyperlipidemia, unspecified: Secondary | ICD-10-CM | POA: Diagnosis not present

## 2018-11-20 DIAGNOSIS — Z7901 Long term (current) use of anticoagulants: Secondary | ICD-10-CM | POA: Insufficient documentation

## 2018-11-20 LAB — CBC
HCT: 40.1 % (ref 39.0–52.0)
Hemoglobin: 13.6 g/dL (ref 13.0–17.0)
MCH: 31.3 pg (ref 26.0–34.0)
MCHC: 33.9 g/dL (ref 30.0–36.0)
MCV: 92.4 fL (ref 80.0–100.0)
Platelets: 172 10*3/uL (ref 150–400)
RBC: 4.34 MIL/uL (ref 4.22–5.81)
RDW: 12.6 % (ref 11.5–15.5)
WBC: 5.4 10*3/uL (ref 4.0–10.5)
nRBC: 0 % (ref 0.0–0.2)

## 2018-11-20 LAB — TYPE AND SCREEN
ABO/RH(D): A POS
Antibody Screen: NEGATIVE

## 2018-11-20 LAB — COMPREHENSIVE METABOLIC PANEL
ALT: 26 U/L (ref 0–44)
AST: 37 U/L (ref 15–41)
Albumin: 4 g/dL (ref 3.5–5.0)
Alkaline Phosphatase: 67 U/L (ref 38–126)
Anion gap: 10 (ref 5–15)
BUN: 31 mg/dL — ABNORMAL HIGH (ref 8–23)
CO2: 20 mmol/L — ABNORMAL LOW (ref 22–32)
Calcium: 9.5 mg/dL (ref 8.9–10.3)
Chloride: 107 mmol/L (ref 98–111)
Creatinine, Ser: 1.08 mg/dL (ref 0.61–1.24)
GFR calc Af Amer: >60 mL/min (ref >60)
GFR calc non Af Amer: >60 mL/min (ref >60)
Glucose, Bld: 97 mg/dL (ref 70–99)
Potassium: 4.6 mmol/L (ref 3.5–5.1)
Sodium: 137 mmol/L (ref 135–145)
Total Bilirubin: 4.7 mg/dL — ABNORMAL HIGH (ref 0.3–1.2)
Total Protein: 7.4 g/dL (ref 6.5–8.1)

## 2018-11-20 LAB — URINALYSIS, ROUTINE W REFLEX MICROSCOPIC
Bilirubin Urine: NEGATIVE
Glucose, UA: NEGATIVE mg/dL
Ketones, ur: NEGATIVE mg/dL
Nitrite: POSITIVE — AB
Protein, ur: 30 mg/dL — AB
RBC / HPF: >50 RBC/hpf — ABNORMAL HIGH (ref 0–5)
Specific Gravity, Urine: 1.018 (ref 1.005–1.030)
WBC, UA: >50 WBC/hpf — ABNORMAL HIGH (ref 0–5)
pH: 5 (ref 5.0–8.0)

## 2018-11-20 LAB — BLOOD GAS, ARTERIAL
Acid-Base Excess: 0.9 mmol/L (ref 0.0–2.0)
Bicarbonate: 24.1 mmol/L (ref 20.0–28.0)
Drawn by: 420591
FIO2: 21
O2 Saturation: 99.3 %
Patient temperature: 98.6
pCO2 arterial: 32.8 mmHg (ref 32.0–48.0)
pH, Arterial: 7.479 — ABNORMAL HIGH (ref 7.350–7.450)
pO2, Arterial: 169 mmHg — ABNORMAL HIGH (ref 83.0–108.0)

## 2018-11-20 LAB — HEMOGLOBIN A1C
Hgb A1c MFr Bld: 5.4 % (ref 4.8–5.6)
Mean Plasma Glucose: 108.28 mg/dL

## 2018-11-20 LAB — SURGICAL PCR SCREEN
MRSA, PCR: NEGATIVE
Staphylococcus aureus: NEGATIVE

## 2018-11-20 LAB — APTT: aPTT: 34 seconds (ref 24–36)

## 2018-11-20 LAB — PROTIME-INR
INR: 1.4 — ABNORMAL HIGH (ref 0.8–1.2)
Prothrombin Time: 16.9 seconds — ABNORMAL HIGH (ref 11.4–15.2)

## 2018-11-20 LAB — BRAIN NATRIURETIC PEPTIDE: B Natriuretic Peptide: 380.4 pg/mL — ABNORMAL HIGH (ref 0.0–100.0)

## 2018-11-20 IMAGING — CR CHEST - 2 VIEW
2 series · 2 of 2 positions shown · non-contrast
Comparison: CT dated [DATE]

CLINICAL DATA: Preop TAVR.  Coronary artery disease.

EXAM:
CHEST - 2 VIEW

[w chest pa]
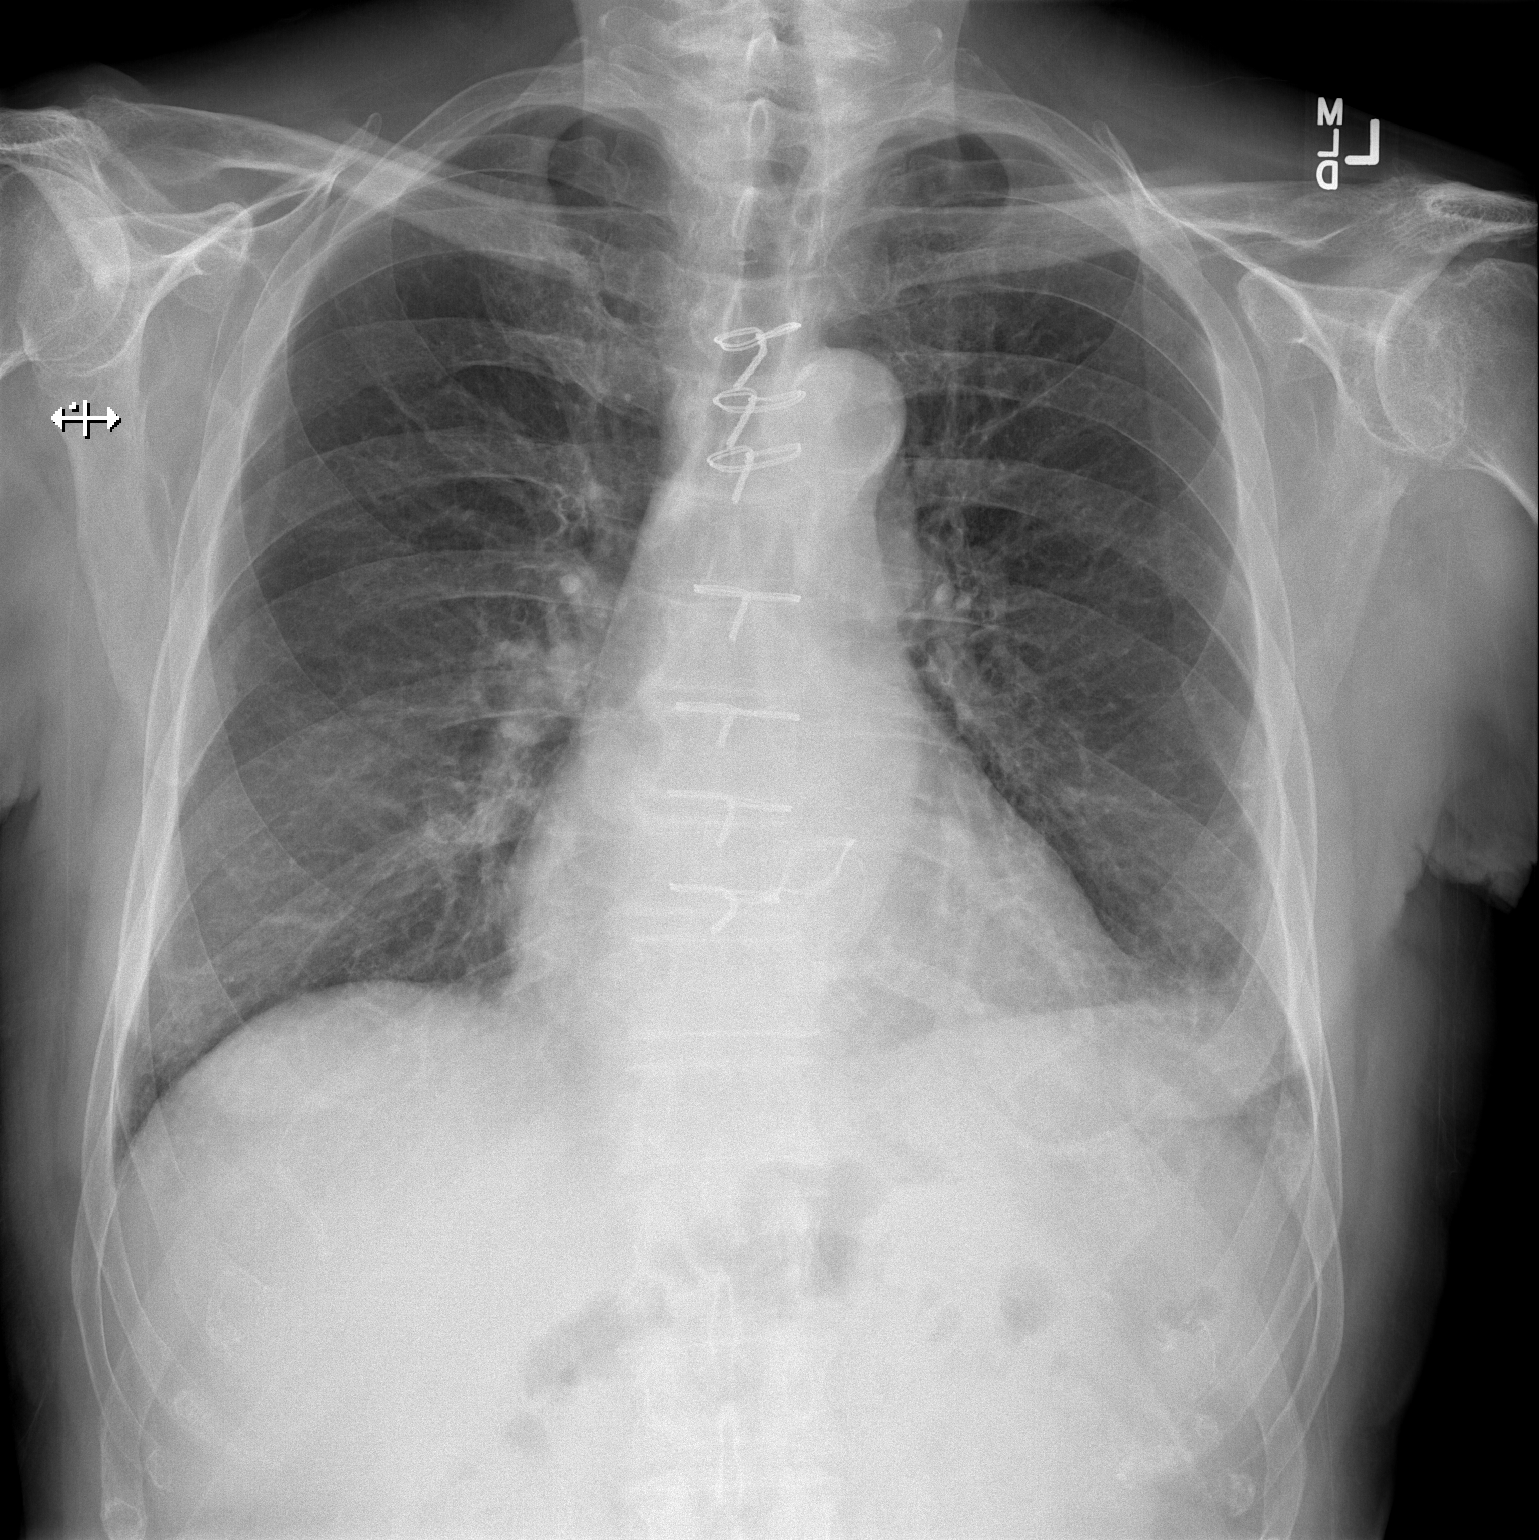

[w chest lat]
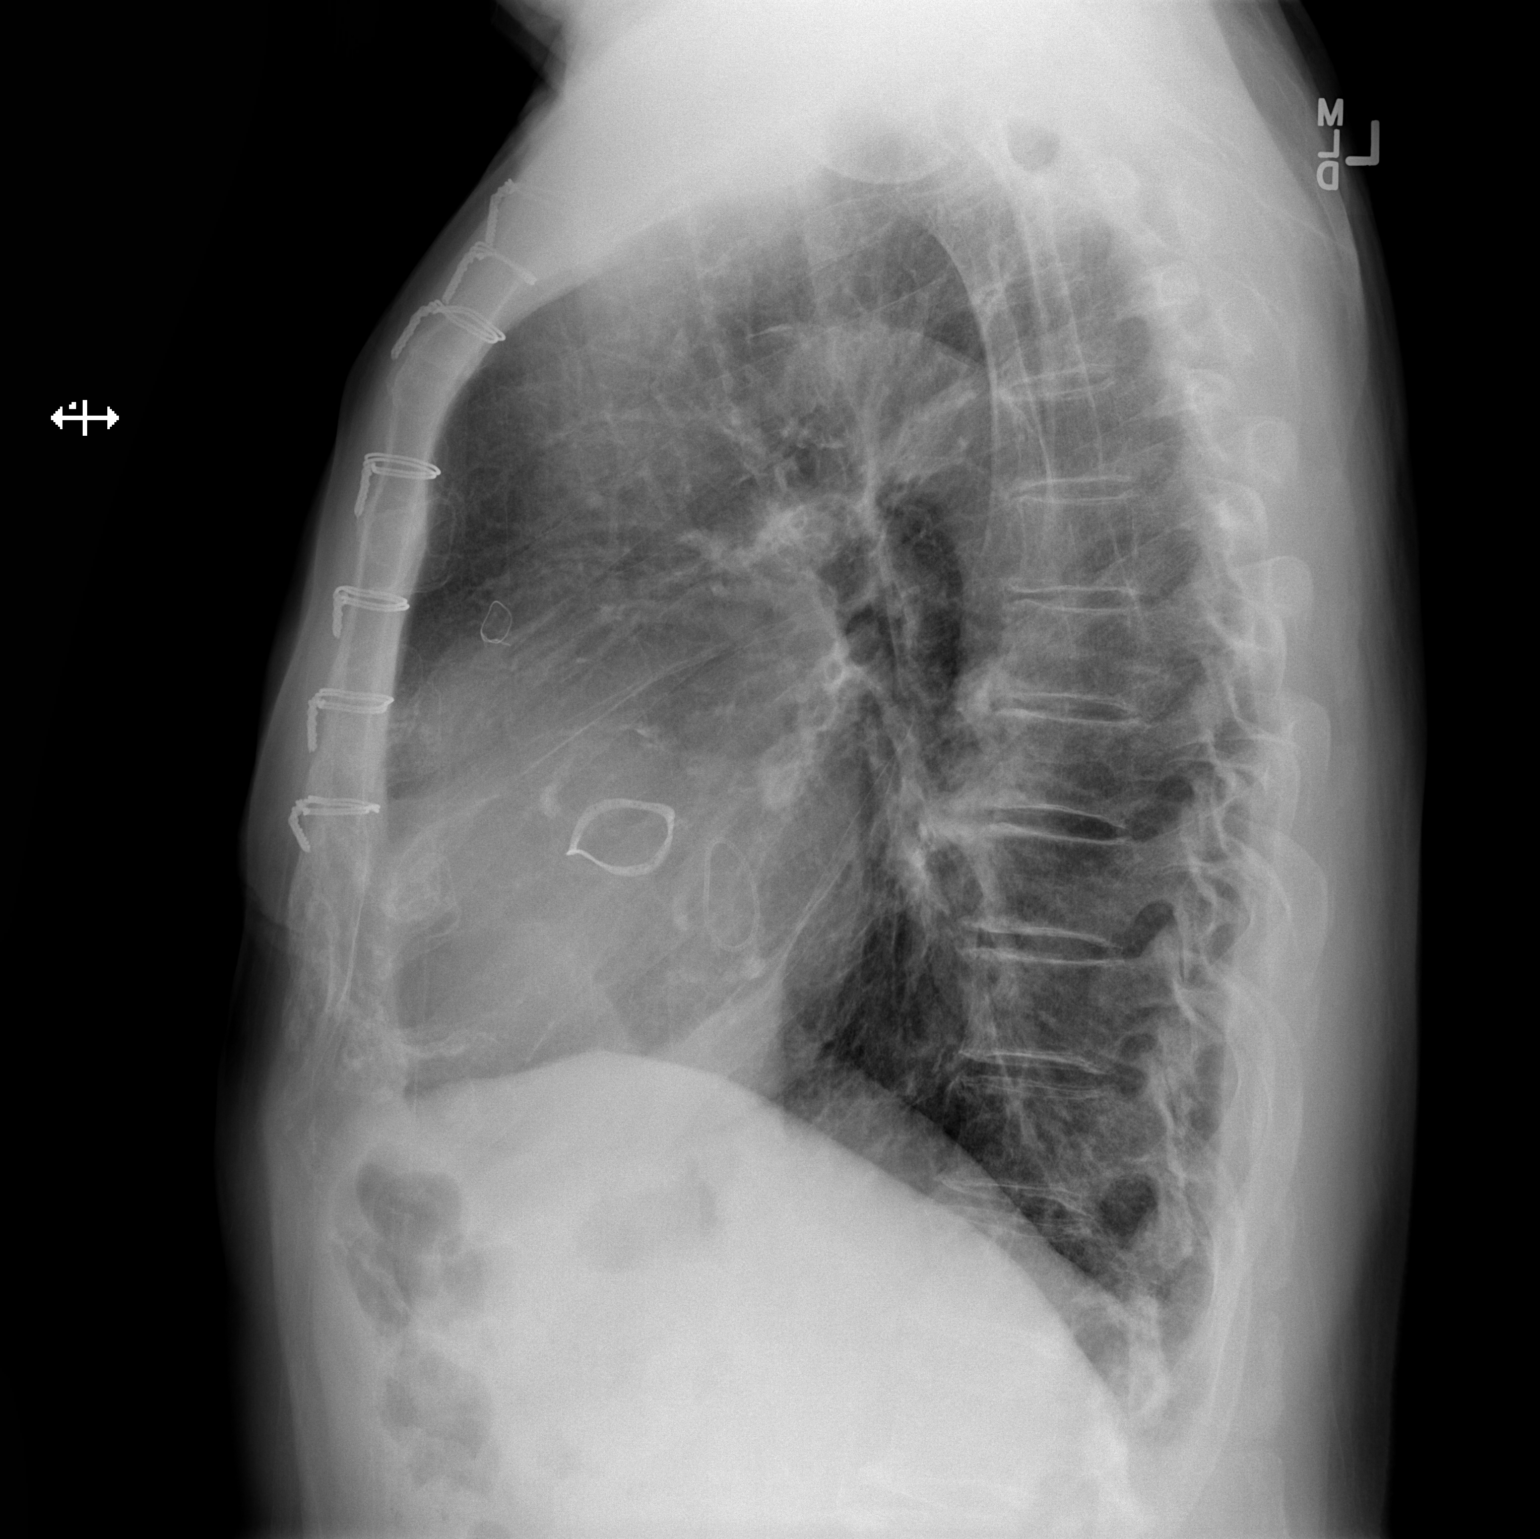

[2 of 2 positions shown; findings below may reference images not displayed]

FINDINGS: The patient is status post prior median sternotomy and multi valve
replacement. Aortic calcifications are noted. There is no
pneumothorax. There is some atelectasis versus scarring at the lung
bases bilaterally. The heart size is mildly enlarged. Aortic
calcifications are noted. Degenerative changes are noted of the
shoulders.
IMPRESSION: No active cardiopulmonary disease.

## 2018-11-20 MED ORDER — CHLORHEXIDINE GLUCONATE 4 % EX LIQD: 60.0000 mL | Freq: Once | CUTANEOUS | Status: DC

## 2018-11-20 NOTE — Progress Notes (Signed)
Carotid artery duplex has been completed. Preliminary results can be found in CV Proc through chart review.   11/20/18 11:32 AM Eric [name] RVT

## 2018-11-21 ENCOUNTER — Ambulatory Visit (HOSPITAL_COMMUNITY): Payer: Medicare Other

## 2018-11-21 ENCOUNTER — Other Ambulatory Visit (HOSPITAL_COMMUNITY)
Admission: RE | Admit: 2018-11-21 | Discharge: 2018-11-21 | Disposition: A | Discharge status: Home / Self Care | Payer: Medicare Other | Source: Ambulatory Visit | Attending: Cardiovascular Disease | Admitting: Cardiovascular Disease

## 2018-11-21 ENCOUNTER — Other Ambulatory Visit: Payer: Self-pay

## 2018-11-21 DIAGNOSIS — I351 Nonrheumatic aortic (valve) insufficiency: Secondary | ICD-10-CM

## 2018-11-21 DIAGNOSIS — Z1159 Encounter for screening for other viral diseases: Secondary | ICD-10-CM | POA: Insufficient documentation

## 2018-11-21 DIAGNOSIS — Z01812 Encounter for preprocedural laboratory examination: Secondary | ICD-10-CM | POA: Insufficient documentation

## 2018-11-21 LAB — SARS CORONAVIRUS 2 (TAT 6-24 HRS): SARS Coronavirus 2: NEGATIVE

## 2018-11-21 MED ORDER — CIPROFLOXACIN HCL 500 MG PO TABS: 500.0000 mg | ORAL_TABLET | Freq: Two times a day (BID) | ORAL | 0 refills | Status: DC

## 2018-11-24 MED ORDER — DEXMEDETOMIDINE HCL IN NACL 400 MCG/100ML IV SOLN
0.1000 ug/kg/h | INTRAVENOUS | Status: AC
  Administered 2018-11-25: 1 ug/kg/h via INTRAVENOUS
  Filled 2018-11-24: qty 100

## 2018-11-24 MED ORDER — SODIUM CHLORIDE 0.9 % IV SOLN
1.5000 g | INTRAVENOUS | Status: AC
  Administered 2018-11-25: 1.5 g via INTRAVENOUS
  Filled 2018-11-24 (×2): qty 1.5

## 2018-11-24 MED ORDER — POTASSIUM CHLORIDE 2 MEQ/ML IV SOLN
80.0000 meq | INTRAVENOUS | Status: DC
  Filled 2018-11-24: qty 40

## 2018-11-24 MED ORDER — MAGNESIUM SULFATE 50 % IJ SOLN
40.0000 meq | INTRAMUSCULAR | Status: DC
  Filled 2018-11-24: qty 9.85

## 2018-11-24 MED ORDER — VANCOMYCIN HCL 10 G IV SOLR
1250.0000 mg | INTRAVENOUS | Status: AC
  Administered 2018-11-25: 1250 mg via INTRAVENOUS
  Filled 2018-11-24 (×2): qty 1250

## 2018-11-24 MED ORDER — SODIUM CHLORIDE 0.9 % IV SOLN
INTRAVENOUS | Status: DC
  Filled 2018-11-24: qty 30

## 2018-11-24 MED ORDER — NOREPINEPHRINE 4 MG/250ML-% IV SOLN
0.0000 ug/min | INTRAVENOUS | Status: DC
  Filled 2018-11-24: qty 250

## 2018-11-24 NOTE — Anesthesia Preprocedure Evaluation (Addendum)
Anesthesia Evaluation  Patient identified by MRN, date of birth, ID band Patient awake    Reviewed: Allergy & Precautions, NPO status , Patient's Chart, lab work & pertinent test results  History of Anesthesia Complications Negative for: history of anesthetic complications  Airway Mallampati: II  TM Distance: >3 FB Neck ROM: Full    Dental no notable dental hx. (+) Dental Advisory Given   Pulmonary neg pulmonary ROS,    breath sounds clear to auscultation       Cardiovascular + CAD  + Valvular Problems/Murmurs AI  Rhythm:Regular Rate:Normal  1.  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  Single-vessel coronary artery disease with severe stenosis of the proximal left circumflex and continued patency of the saphenous vein graft to first OM 3.  Patent left main, LAD, and RCA with mild diffuse nonobstructive disease 4.  Normal right heart pressures and preserved cardiac output Narrative 1. Severe bioprosthetic aortic valve insufficiency by echo assessment and  mild aortic stenosis with mean gradient 10 mmHg (likely flow related  secondary to severe aortic insufficiency) 2. Single-vessel coronary artery disease with severe stenosis of the  proximal left circumflex and continued patency of the saphenous vein graft  to first OM 3. Patent left main, LAD, and RCA with mild diffuse nonobstructive  disease 4. Normal right heart pressures and preserved cardiac output   Recommend: Continued multidisciplinary team evaluation with CTA studies  and formal surgical consultation with Dr. [name] in anticipation of  possible valve-in-valve TAVR   Neuro/Psych negative neurological ROS  negative psych ROS   GI/Hepatic   Endo/Other    Renal/GU      Musculoskeletal   Abdominal   Peds  Hematology   Anesthesia Other Findings   Reproductive/Obstetrics                             Anesthesia Physical Anesthesia Plan  ASA: IV  Anesthesia Plan:    Post-op Pain Management:    Induction:   PONV Risk Score and Plan: 2 and Ondansetron and Propofol infusion  Airway Management Planned: Natural Airway  Additional Equipment:   Intra-op Plan:   Post-operative Plan:   Informed Consent: I have reviewed the patients History and Physical, chart, labs and discussed the procedure including the risks, benefits and alternatives for the proposed anesthesia with the patient or authorized representative who has indicated his/her understanding and acceptance.     Dental advisory given  Plan Discussed with: CRNA, Anesthesiologist and Surgeon  Anesthesia Plan Comments: (PAT note written 11/24/2018 by Myra Gianotti, PA-C. )       Anesthesia Quick Evaluation

## 2018-11-24 NOTE — Progress Notes (Signed)
Anesthesia Chart Review:  Case: [id number] Date/Time: 11/25/18 1030   Procedures:      TRANSCATHETER AORTIC VALVE REPLACEMENT, TRANSFEMORAL (N/A Chest)     TRANSESOPHAGEAL ECHOCARDIOGRAM (TEE) (N/A )   Anesthesia type: General   Pre-op diagnosis: Severe Aortic Insufficiency   Location: [facility] OR ROOM 16 / [place] [facility]   Surgeon: [name], MD    CT surgeon: Gilford Raid, MD   DISCUSSION: Patient is an 85 year old male scheduled for the above procedure.  History includes never smoker, CAD with severe AS/MR (s/p 1V CABG: SVG-OM combined with pericardial tissue AVR and MV repair with quadrangular resection of flail segment of posterior mitral leaflet and ring annuloplasty, ligation of LA appendage 06/19/07; severe prosthetic AR, mild AS, moderate-severe TR 10/2018), valvular disease, paroxysmal a-flutter (s/p DCCV 04/06/14), afib (2019), DVT, PE (2000, 2005, lifelong anticoagulation), dyspnea, thrombocytopenia, prostate cancer (s/p radical prostatectomy 10/1998), bladder cancer (s/p TURBT 02/13/16, 09/04/16), skin cancer (melanoma excision 2014, SCC left ear 2020), goiter, hearing aids.   Last Eliquis 11/19/18. Total bilirubin 4.7 with normal AST/ALT. Total bilirubin 2.5 06/17/07 prior to cardiac surgery at that time. I have routed results to APP and RN with TAVR team for review. Rosanna Randy Syndrome could be a possible etiology, but not listed in his history--would defer to surgical team and PCP additional work-up recommendations if otherwise asymptomatic. 11/21/18 presurgical COVID test negative.    VS: BP (!) 164/52   Pulse 74   Temp 36.9 C (Oral)   Resp 18   Ht 6' (1.829 m)   Wt 85.4 kg   SpO2 98%   BMI 25.52 kg/m   PROVIDERS: [name], MD is PCP  [name], MD is cardiologist   LABS: Preoperative labs noted. PT 16.9, INR 1.4. PLT 172K. Last Eliquis 11/19/18, so will not repeat PT/INR on day of surgery. Dr. [name] is aware of UA results and Cipro x 3 days prescribed. Total bilirubin 4.7  (previously 2.5 on 06/17/07). AST/ALT WNL. No documented history of Rosanna Randy Syndrome that I am aware of.  (all labs ordered are listed, but only abnormal results are displayed)  Labs Reviewed  BLOOD GAS, ARTERIAL - Abnormal; Notable for the following components:      Result Value   pH, Arterial 7.479 (*)    pO2, Arterial 169 (*)    All other components within normal limits  BRAIN NATRIURETIC PEPTIDE - Abnormal; Notable for the following components:   B Natriuretic Peptide 380.4 (*)    All other components within normal limits  COMPREHENSIVE METABOLIC PANEL - Abnormal; Notable for the following components:   CO2 20 (*)    BUN 31 (*)    Total Bilirubin 4.7 (*)    All other components within normal limits  PROTIME-INR - Abnormal; Notable for the following components:   Prothrombin Time 16.9 (*)    INR 1.4 (*)    All other components within normal limits  URINALYSIS, ROUTINE W REFLEX MICROSCOPIC - Abnormal; Notable for the following components:   Color, Urine AMBER (*)    APPearance HAZY (*)    Hgb urine dipstick LARGE (*)    Protein, ur 30 (*)    Nitrite POSITIVE (*)    Leukocytes,Ua MODERATE (*)    RBC / HPF >50 (*)    WBC, UA >50 (*)    Bacteria, UA MANY (*)    All other components within normal limits  SURGICAL PCR SCREEN  APTT  CBC  HEMOGLOBIN A1C  TYPE AND SCREEN  IMAGES: CXR 11/20/18: IMPRESSION: No active cardiopulmonary disease.  CTA chest/abd/pelvis 11/03/18: IMPRESSION: 1. Vascular findings and measurements pertinent to potential TAVR procedure, as detailed. 2. Thickening of the aortic valve, compatible with the reported history of severe aortic stenosis. Aortic and mitral annuloplasty rings in place. 3. Mild cardiomegaly. 4. Ectatic 2.8 cm infrarenal abdominal aorta, at risk for aneurysm development. Recommend follow-up aortic ultrasound in 5 years. This recommendation follows ACR consensus guidelines: White Paper of the ACR Incidental Findings  Committee II on Vascular Findings. J Am Coll Radiol 2013; 10:789-794. 5. Right thyroid lobe 3.1 cm nodule, not substantially changed in size since 2008 chest CT. 6. Nonspecific basilar predominant findings that may indicate an interstitial lung disease, without significant bronchiectasis or honeycombing. Pulmonology consultation and follow-up high-resolution chest CT could be obtained in 3-6 months as clinically warranted. 7. Nonspecific eccentric left bladder wall thickening. 8. Moderate sigmoid diverticulosis. 9.  Aortic Atherosclerosis (ICD10-I70.0).   EKG: Afib at 73 bpm, cannot rule out anterior infarct, age undetermined.   CV: Carotid US 11/20/18: Summary: Right Carotid: Velocities in the right ICA are consistent with a 1-39% stenosis. Left Carotid: Velocities in the left ICA are consistent with a 1-39% stenosis. Vertebrals: Bilateral vertebral arteries demonstrate antegrade flow.   CT coronary 11/03/18: IMPRESSION: 1. 23 mm Mitroflow stented pericardial tissue valve with thickened leaflets and possible prolapse of right cusp. 2. On SA basal view LM is 7.3 mm from valve leaflet border and RCA is 7.4 mm away However there is concern for the coronary heights Even a 20 mm Sapein 3 is 14 mm in height The RCA appears to rest behind the right leaflet with STJ calcium and may be at higher risk of occlusion 3.  Normal aortic root 3.6 cm 4.  Post mitral valve repair with annuloplasty ring 5.  LAA is patent Surgical note indicates ligation 6.  Patent SVG to OM   Cardiac cath 10/29/18: 1.  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  Single-vessel coronary artery disease with severe stenosis of the proximal left circumflex and continued patency of the saphenous vein graft to first OM 3.  Patent left main, LAD, and RCA with mild diffuse  nonobstructive disease 4.  Normal right heart pressures and preserved cardiac output Recommend: Continued multidisciplinary team evaluation with CTA studies and formal surgical consultation with Dr. [name] in anticipation of possible valve-in-valve TAVR   TEE 10/22/18: IMPRESSIONS  1. The left ventricle has low normal systolic function, with an ejection fraction of 50-55%. Left ventricular diastolic function could not be evaluated. No evidence of left ventricular regional wall motion abnormalities.  2. The right ventricle has normal systolc function. The cavity was normal. There is no increase in right ventricular wall thickness.  3. Left atrial size was mildly dilated.  4. Right atrial size was mildly dilated.  5. Moderately thickened tricuspid valve leaflets.  6. 75mm Sorin mitral valve repair annular ring intact. MV Peak grad:  8.4 mmHg MV Mean grad: 3.0 mmHg MV Vmax:     1.45 m/s MV Vmean:    66.7 cm/s MV VTI:      0.24 m    7. 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 TR Peak grad: 26.6 mmHg TR Vmax: 258.00 cm/s  8. A 28 Mitralflow porcine bioprosthesis valve is present in the aortic position. Echo findings are consistent with intravalvular regurgitation of  the aortic prosthesis.  9. Aortic valve regurgitation is severe by color flow Doppler. Mild stenosis of the aortic valve. AV Vmax:     260.00 cm/s  AV Vmean:    181.000 cm/s AV VTI:      0.569 m     AV Peak Grad:  27.0 mmHg  AV Mean Grad:  15.0 mmHg  AR PHT:      303 msec   10. No vegetation on the aortic valve. 11. The aortic root is normal in size and structure. 12. Previously ligated left atrial appendage. 13. Severe bioprosthetic aortic valve regurgitation.   Past Medical History:  Diagnosis Date  . Atrial flutter, paroxysmal ([place])    a. dx 04-02-2014, s/p  successful cardioversion 04-06-2014.  . Bladder tumor   . Cancer ([place])   . Chronic anticoagulation    on Eliquis--   due to recurrent dvt's and pe's  . Dyslipidemia   . Dyspnea   . History of bladder cancer urologist-  dr [name]   02-13-2016  s/p TURBT per path high grade papillary urothelial carcinoma  . History of DVT (deep vein thrombosis)    2000-- RLE  . History of melanoma excision    left flank;  07/ 2014 left lower leg and right upper chest  . History of prostate cancer    Gleason 6--  s/p  radical prostatectomy 06/ 2000 in [place], [place]---  no recurrence  . History of pulmonary embolus (PE)    2000 & 2005  bilateral  . Hx of valvuloplasty 06/19/2007   a. s/p mitral ring annuloplasty, repair ruptured chordae of P2 flail segments of MV  . S/P aortic valve replacement with bioprosthetic valve    06-19-2007  severe aortic valve stenosis  . Single vessel coronary artery disease   cardiologist-  dr Irish Lack   a. 05/2007 CABG x 1: s/p SVG-OM;  b. 10/2010 Ex MV: EF 72%, inf attenuation w/o ischemia, brief run of PAT with exercise. To  . Thrombocytopenia ([place])   . Thyroid goiter 2013   nodular  . Wears hearing aid    BILATERAL  . Wears partial dentures    UPPER    Past Surgical History:  Procedure Laterality Date  . AORTIC VALVE REPLACEMENT (AVR)/CORONARY ARTERY BYPASS GRAFTING (CABG)  06-19-2007  dr [name]   SVG to OM1 ;   AVR w/ #23 Mitralflow pericardial and ligation left atrial appendage;  MV repair w/ #28 Sorin 3-D memo Ring Annuloplasty with repair ruptured chordar of P-2 flail segments  . CARDIAC CATHETERIZATION  05-12-2007  dr Leonia Reeves   single vessel 30% ostial LAD,  80% LCx;  severe to critial AS,  moderate MR,  normal LVF, ef 70%,  normal right heart pressures and cardiac outputs,  mild elevated LV end-diastolic pressure    . CARDIOVASCULAR STRESS TEST  11-02-2010  dr Irish Lack   normal nuclear study w/ no ischemia or infarct/scar/  normal LV function and wall motion , ef 72%  . CARDIOVERSION N/A 04/06/2014   Procedure: CARDIOVERSION;  Surgeon: [name], MD;  Location: [facility];  Service: Cardiovascular;  Laterality: N/A;   successful  . CATARACT EXTRACTION W/ INTRAOCULAR LENS  IMPLANT, BILATERAL  2012  approx  . CORONARY ARTERY BYPASS GRAFT    . RETROPUBIC RADICAL PROSTATECTOMY  06/ 2000  in [place], [place]  . RIGHT/LEFT HEART CATH AND CORONARY/GRAFT ANGIOGRAPHY N/A 10/29/2018   Procedure: RIGHT/LEFT HEART CATH AND CORONARY/GRAFT ANGIOGRAPHY;  Surgeon: [name], MD;  Location: [place]  CV LAB;  Service: Cardiovascular;  Laterality: N/A;  . ROTATOR CUFF REPAIR Left 09/1998  . TEE WITHOUT CARDIOVERSION N/A 04/06/2014   Procedure: TRANSESOPHAGEAL ECHOCARDIOGRAM (TEE);  Surgeon: [name], MD;  Location: [facility] ENDOSCOPY;  Service: Cardiovascular;  Laterality: N/A;  mild focal basa LVH of the septum, ef 50-55%, s/p AV annuloplasty ring, elongated chordae, thickened leaflets, mild to mod. MR, multiple small jets/arotic bioprosthetic valve sits well in position, mild AI/ thickened TV w/ mild reurg./mild LA  . TEE WITHOUT CARDIOVERSION N/A 10/22/2018   Procedure: TRANSESOPHAGEAL ECHOCARDIOGRAM (TEE);  Surgeon: [name], MD;  Location[facility] ENDOSCOPY;  Service: Cardiovascular;  Laterality: N/A;  . TRANSTHORACIC ECHOCARDIOGRAM  03-23-2016   dr Irish Lack   EF 55-60%, reduced contribution atrial contraction to ventricular filling, due to increased ventricular diastolic pressure or atrial contratile dysfunction/  bioprosthetic AV w/ mod. regurg. (valve area 1.97cm^2)/ mild dilated ascending aorta/ mild thicken MV normal function annular ring prosthesis/severe LAE & mod. to sev.RAE/ PASP 58mmHg/ mild TR/ ventricle septal motion show paradox  . TRANSURETHRAL RESECTION OF BLADDER TUMOR N/A 02/13/2016   Procedure: TRANSURETHRAL RESECTION OF BLADDER TUMOR (TURBT);  Surgeon: Nickie Retort, MD;  Location: [facility];  Service: Urology;  Laterality: N/A;  . TRANSURETHRAL RESECTION OF BLADDER TUMOR N/A 09/04/2016   Procedure: TRANSURETHRAL RESECTION OF  BLADDER TUMOR (TURBT);  Surgeon: Nickie Retort, MD;  Location: [facility];  Service: Urology;  Laterality: N/A;    MEDICATIONS: . atorvastatin (LIPITOR) 40 MG tablet  . ciprofloxacin (CIPRO) 500 MG tablet  . ELIQUIS 5 MG TABS tablet  . ezetimibe (ZETIA) 10 MG tablet  . furosemide (LASIX) 20 MG tablet  . metoprolol tartrate (LOPRESSOR) 25 MG tablet   No current facility-administered medications for this encounter.     Myra Gianotti, PA-C Surgical Short Stay/Anesthesiology [facility] Phone [phone] [facility] Phone [phone] 11/24/2018 11:12 AM

## 2018-11-25 ENCOUNTER — Encounter (HOSPITAL_COMMUNITY): Payer: Self-pay

## 2018-11-25 ENCOUNTER — Encounter (HOSPITAL_COMMUNITY)
Admission: RE | Disposition: A | Discharge status: Home / Self Care | Payer: Medicare Other | Source: Home / Self Care | Attending: Surgery

## 2018-11-25 ENCOUNTER — Inpatient Hospital Stay (HOSPITAL_COMMUNITY): Payer: Medicare Other | Admitting: Certified Registered Nurse Anesthetist

## 2018-11-25 ENCOUNTER — Inpatient Hospital Stay (HOSPITAL_COMMUNITY): Payer: Medicare Other

## 2018-11-25 ENCOUNTER — Inpatient Hospital Stay (HOSPITAL_COMMUNITY)
Admission: RE | Admit: 2018-11-25 | Discharge: 2018-11-26 | DRG: 266 | Disposition: A | Discharge status: Home / Self Care | Payer: Medicare Other | Attending: Surgery | Admitting: Surgery

## 2018-11-25 ENCOUNTER — Other Ambulatory Visit: Payer: Self-pay

## 2018-11-25 DIAGNOSIS — Z954 Presence of other heart-valve replacement: Secondary | ICD-10-CM | POA: Diagnosis not present

## 2018-11-25 DIAGNOSIS — Z86711 Personal history of pulmonary embolism: Secondary | ICD-10-CM | POA: Diagnosis present

## 2018-11-25 DIAGNOSIS — Z952 Presence of prosthetic heart valve: Secondary | ICD-10-CM | POA: Diagnosis not present

## 2018-11-25 DIAGNOSIS — Z806 Family history of leukemia: Secondary | ICD-10-CM

## 2018-11-25 DIAGNOSIS — I77811 Abdominal aortic ectasia: Secondary | ICD-10-CM | POA: Diagnosis present

## 2018-11-25 DIAGNOSIS — Z972 Presence of dental prosthetic device (complete) (partial): Secondary | ICD-10-CM

## 2018-11-25 DIAGNOSIS — I083 Combined rheumatic disorders of mitral, aortic and tricuspid valves: Secondary | ICD-10-CM | POA: Diagnosis present

## 2018-11-25 DIAGNOSIS — T82897A Other specified complication of cardiac prosthetic devices, implants and grafts, initial encounter: Secondary | ICD-10-CM | POA: Diagnosis present

## 2018-11-25 DIAGNOSIS — I2510 Atherosclerotic heart disease of native coronary artery without angina pectoris: Secondary | ICD-10-CM | POA: Diagnosis present

## 2018-11-25 DIAGNOSIS — I4891 Unspecified atrial fibrillation: Secondary | ICD-10-CM | POA: Diagnosis present

## 2018-11-25 DIAGNOSIS — Z8551 Personal history of malignant neoplasm of bladder: Secondary | ICD-10-CM | POA: Diagnosis not present

## 2018-11-25 DIAGNOSIS — Y831 Surgical operation with implant of artificial internal device as the cause of abnormal reaction of the patient, or of later complication, without mention of misadventure at the time of the procedure: Secondary | ICD-10-CM | POA: Diagnosis present

## 2018-11-25 DIAGNOSIS — I351 Nonrheumatic aortic (valve) insufficiency: Secondary | ICD-10-CM | POA: Diagnosis not present

## 2018-11-25 DIAGNOSIS — Z86718 Personal history of other venous thrombosis and embolism: Secondary | ICD-10-CM

## 2018-11-25 DIAGNOSIS — T8209XA Other mechanical complication of heart valve prosthesis, initial encounter: Secondary | ICD-10-CM

## 2018-11-25 DIAGNOSIS — Z85820 Personal history of malignant melanoma of skin: Secondary | ICD-10-CM | POA: Diagnosis not present

## 2018-11-25 DIAGNOSIS — Z79899 Other long term (current) drug therapy: Secondary | ICD-10-CM

## 2018-11-25 DIAGNOSIS — Z9842 Cataract extraction status, left eye: Secondary | ICD-10-CM | POA: Diagnosis not present

## 2018-11-25 DIAGNOSIS — Z9841 Cataract extraction status, right eye: Secondary | ICD-10-CM

## 2018-11-25 DIAGNOSIS — I749 Embolism and thrombosis of unspecified artery: Secondary | ICD-10-CM | POA: Diagnosis present

## 2018-11-25 DIAGNOSIS — Z7901 Long term (current) use of anticoagulants: Secondary | ICD-10-CM

## 2018-11-25 DIAGNOSIS — Z951 Presence of aortocoronary bypass graft: Secondary | ICD-10-CM | POA: Diagnosis not present

## 2018-11-25 DIAGNOSIS — Z974 Presence of external hearing-aid: Secondary | ICD-10-CM | POA: Diagnosis not present

## 2018-11-25 DIAGNOSIS — I5033 Acute on chronic diastolic (congestive) heart failure: Secondary | ICD-10-CM | POA: Diagnosis present

## 2018-11-25 DIAGNOSIS — I110 Hypertensive heart disease with heart failure: Secondary | ICD-10-CM | POA: Diagnosis present

## 2018-11-25 DIAGNOSIS — Z961 Presence of intraocular lens: Secondary | ICD-10-CM | POA: Diagnosis present

## 2018-11-25 DIAGNOSIS — E785 Hyperlipidemia, unspecified: Secondary | ICD-10-CM | POA: Diagnosis present

## 2018-11-25 DIAGNOSIS — Z006 Encounter for examination for normal comparison and control in clinical research program: Secondary | ICD-10-CM

## 2018-11-25 DIAGNOSIS — Z8546 Personal history of malignant neoplasm of prostate: Secondary | ICD-10-CM | POA: Diagnosis not present

## 2018-11-25 DIAGNOSIS — I350 Nonrheumatic aortic (valve) stenosis: Secondary | ICD-10-CM

## 2018-11-25 DIAGNOSIS — Z9889 Other specified postprocedural states: Secondary | ICD-10-CM

## 2018-11-25 HISTORY — PX: TEE WITHOUT CARDIOVERSION: SHX5443

## 2018-11-25 HISTORY — DX: Presence of prosthetic heart valve: Z95.2

## 2018-11-25 HISTORY — PX: TRANSCATHETER AORTIC VALVE REPLACEMENT, TRANSFEMORAL: SHX6400

## 2018-11-25 LAB — POCT I-STAT 4, (NA,K, GLUC, HGB,HCT)
Glucose, Bld: 96 mg/dL (ref 70–99)
Glucose, Bld: 121 mg/dL — ABNORMAL HIGH (ref 70–99)
Glucose, Bld: 136 mg/dL — ABNORMAL HIGH (ref 70–99)
HCT: 38 % — ABNORMAL LOW (ref 39.0–52.0)
HCT: 35 % — ABNORMAL LOW (ref 39.0–52.0)
HCT: 34 % — ABNORMAL LOW (ref 39.0–52.0)
Hemoglobin: 12.9 g/dL — ABNORMAL LOW (ref 13.0–17.0)
Hemoglobin: 11.9 g/dL — ABNORMAL LOW (ref 13.0–17.0)
Hemoglobin: 11.6 g/dL — ABNORMAL LOW (ref 13.0–17.0)
Potassium: 4 mmol/L (ref 3.5–5.1)
Potassium: 4 mmol/L (ref 3.5–5.1)
Potassium: 4.2 mmol/L (ref 3.5–5.1)
Sodium: 143 mmol/L (ref 135–145)
Sodium: 142 mmol/L (ref 135–145)
Sodium: 142 mmol/L (ref 135–145)

## 2018-11-25 LAB — POCT I-STAT, CHEM 8
BUN: 25 mg/dL — ABNORMAL HIGH (ref 8–23)
Calcium, Ion: 1.29 mmol/L (ref 1.15–1.40)
Chloride: 105 mmol/L (ref 98–111)
Creatinine, Ser: 0.9 mg/dL (ref 0.61–1.24)
Glucose, Bld: 139 mg/dL — ABNORMAL HIGH (ref 70–99)
HCT: 36 % — ABNORMAL LOW (ref 39.0–52.0)
Hemoglobin: 12.2 g/dL — ABNORMAL LOW (ref 13.0–17.0)
Potassium: 4.4 mmol/L (ref 3.5–5.1)
Sodium: 142 mmol/L (ref 135–145)
TCO2: 27 mmol/L (ref 22–32)

## 2018-11-25 LAB — PROTIME-INR
INR: 1.1 (ref 0.8–1.2)
Prothrombin Time: 14.5 seconds (ref 11.4–15.2)

## 2018-11-25 IMAGING — DX PORTABLE CHEST - 1 VIEW
1 series · 1 of 1 positions shown · non-contrast
Comparison: [DATE]

CLINICAL DATA: Status post TAVR.

EXAM:
PORTABLE CHEST 1 VIEW

[chest]
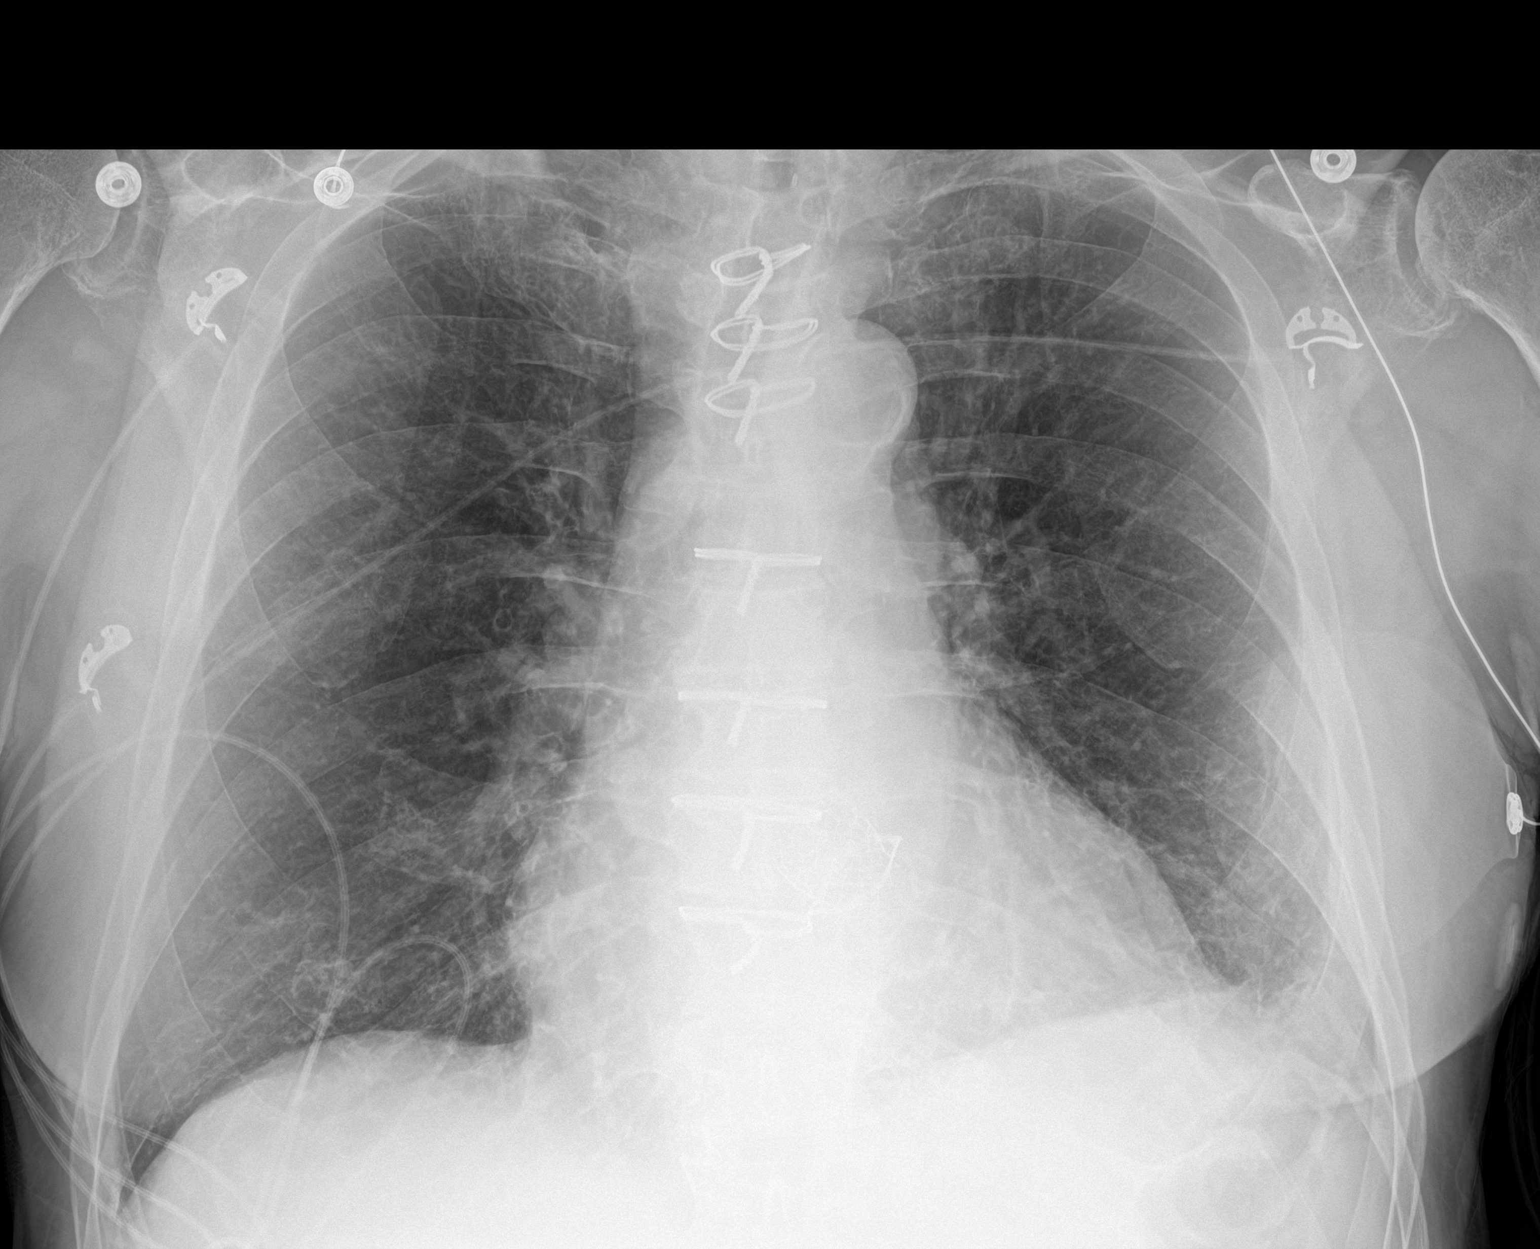

[1 of 1 positions shown; findings below may reference images not displayed]

FINDINGS: The patient is status post TAVR. The heart, hila, mediastinum, and
pleura are unremarkable. There is a small left effusion with
underlying atelectasis. No other changes.
IMPRESSION: There is a small left effusion with underlying atelectasis. No other
abnormalities.

## 2018-11-25 SURGERY — IMPLANTATION, AORTIC VALVE, TRANSCATHETER, FEMORAL APPROACH
Anesthesia: General | Site: Chest

## 2018-11-25 MED ORDER — ONDANSETRON HCL 4 MG/2ML IJ SOLN: 4.0000 mg | Freq: Four times a day (QID) | INTRAMUSCULAR | Status: DC | PRN

## 2018-11-25 MED ORDER — PROTAMINE SULFATE 10 MG/ML IV SOLN
INTRAVENOUS | Status: DC | PRN
  Administered 2018-11-25: 120 mg via INTRAVENOUS

## 2018-11-25 MED ORDER — SODIUM CHLORIDE 0.9 % IV SOLN: INTRAVENOUS | Status: DC

## 2018-11-25 MED ORDER — SODIUM CHLORIDE 0.9% FLUSH
3.0000 mL | Freq: Two times a day (BID) | INTRAVENOUS | Status: DC
  Administered 2018-11-25 – 2018-11-26 (×2): 3 mL via INTRAVENOUS

## 2018-11-25 MED ORDER — CHLORHEXIDINE GLUCONATE 4 % EX LIQD: 30.0000 mL | CUTANEOUS | Status: DC

## 2018-11-25 MED ORDER — SODIUM CHLORIDE 0.9 % IV SOLN
1.5000 g | Freq: Two times a day (BID) | INTRAVENOUS | Status: DC
  Administered 2018-11-25 – 2018-11-26 (×2): 1.5 g via INTRAVENOUS
  Filled 2018-11-25 (×3): qty 1.5

## 2018-11-25 MED ORDER — ACETAMINOPHEN 650 MG RE SUPP: 650.0000 mg | Freq: Four times a day (QID) | RECTAL | Status: DC | PRN

## 2018-11-25 MED ORDER — CHLORHEXIDINE GLUCONATE 0.12 % MT SOLN
OROMUCOSAL | Status: AC
  Administered 2018-11-25: 15 mL via OROMUCOSAL
  Filled 2018-11-25: qty 15

## 2018-11-25 MED ORDER — CHLORHEXIDINE GLUCONATE 0.12 % MT SOLN
15.0000 mL | Freq: Once | OROMUCOSAL | Status: AC
  Administered 2018-11-25: 15 mL via OROMUCOSAL

## 2018-11-25 MED ORDER — HEPARIN SODIUM (PORCINE) 1000 UNIT/ML IJ SOLN
INTRAMUSCULAR | Status: DC | PRN
  Administered 2018-11-25: 12000 [IU] via INTRAVENOUS

## 2018-11-25 MED ORDER — 0.9 % SODIUM CHLORIDE (POUR BTL) OPTIME
TOPICAL | Status: DC | PRN
  Administered 2018-11-25: 1000 mL

## 2018-11-25 MED ORDER — SODIUM CHLORIDE 0.9 % IV SOLN: 250.0000 mL | INTRAVENOUS | Status: DC | PRN

## 2018-11-25 MED ORDER — FENTANYL CITRATE (PF) 100 MCG/2ML IJ SOLN
INTRAMUSCULAR | Status: DC | PRN
  Administered 2018-11-25 (×3): 25 ug via INTRAVENOUS

## 2018-11-25 MED ORDER — MORPHINE SULFATE (PF) 2 MG/ML IV SOLN: 1.0000 mg | INTRAVENOUS | Status: DC | PRN

## 2018-11-25 MED ORDER — ONDANSETRON HCL 4 MG/2ML IJ SOLN
INTRAMUSCULAR | Status: AC
  Filled 2018-11-25: qty 2

## 2018-11-25 MED ORDER — LIDOCAINE HCL (PF) 1 % IJ SOLN
INTRAMUSCULAR | Status: AC
  Filled 2018-11-25: qty 30

## 2018-11-25 MED ORDER — FENTANYL CITRATE (PF) 250 MCG/5ML IJ SOLN
INTRAMUSCULAR | Status: AC
  Filled 2018-11-25: qty 5

## 2018-11-25 MED ORDER — PROTAMINE SULFATE 10 MG/ML IV SOLN
INTRAVENOUS | Status: AC
  Filled 2018-11-25: qty 25

## 2018-11-25 MED ORDER — SODIUM CHLORIDE 0.9 % IV SOLN
INTRAVENOUS | Status: AC
  Administered 2018-11-25: 14:00:00 via INTRAVENOUS

## 2018-11-25 MED ORDER — VANCOMYCIN HCL IN DEXTROSE 1-5 GM/200ML-% IV SOLN
1000.0000 mg | Freq: Once | INTRAVENOUS | Status: AC
  Administered 2018-11-25: 1000 mg via INTRAVENOUS
  Filled 2018-11-25: qty 200

## 2018-11-25 MED ORDER — PHENYLEPHRINE HCL-NACL 20-0.9 MG/250ML-% IV SOLN
0.0000 ug/min | INTRAVENOUS | Status: DC
  Filled 2018-11-25: qty 250

## 2018-11-25 MED ORDER — NITROGLYCERIN IN D5W 200-5 MCG/ML-% IV SOLN
0.0000 ug/min | INTRAVENOUS | Status: DC
  Administered 2018-11-25: 5 ug/min via INTRAVENOUS
  Filled 2018-11-25: qty 250

## 2018-11-25 MED ORDER — TRAMADOL HCL 50 MG PO TABS: 50.0000 mg | ORAL_TABLET | ORAL | Status: DC | PRN

## 2018-11-25 MED ORDER — LACTATED RINGERS IV SOLN
INTRAVENOUS | Status: DC | PRN
  Administered 2018-11-25 (×2): via INTRAVENOUS

## 2018-11-25 MED ORDER — ASPIRIN 81 MG PO CHEW
81.0000 mg | CHEWABLE_TABLET | Freq: Every day | ORAL | Status: DC
  Administered 2018-11-26: 81 mg via ORAL
  Filled 2018-11-25: qty 1

## 2018-11-25 MED ORDER — SODIUM CHLORIDE 0.9 % IV SOLN
INTRAVENOUS | Status: DC | PRN
  Administered 2018-11-25: 1500 mL via INTRAMUSCULAR

## 2018-11-25 MED ORDER — SODIUM CHLORIDE 0.9 % IV SOLN
INTRAVENOUS | Status: AC
  Filled 2018-11-25 (×3): qty 1.2

## 2018-11-25 MED ORDER — PROTAMINE SULFATE 10 MG/ML IV SOLN
INTRAVENOUS | Status: AC
  Filled 2018-11-25: qty 10

## 2018-11-25 MED ORDER — LIDOCAINE HCL 1 % IJ SOLN
INTRAMUSCULAR | Status: DC | PRN
  Administered 2018-11-25: 5 mL

## 2018-11-25 MED ORDER — ATORVASTATIN CALCIUM 40 MG PO TABS
40.0000 mg | ORAL_TABLET | Freq: Every evening | ORAL | Status: DC
  Administered 2018-11-25: 40 mg via ORAL
  Filled 2018-11-25: qty 1

## 2018-11-25 MED ORDER — HEPARIN SODIUM (PORCINE) 1000 UNIT/ML IJ SOLN
INTRAMUSCULAR | Status: AC
  Filled 2018-11-25: qty 1

## 2018-11-25 MED ORDER — PROPOFOL 500 MG/50ML IV EMUL
INTRAVENOUS | Status: DC | PRN
  Administered 2018-11-25: 10 ug/kg/min via INTRAVENOUS

## 2018-11-25 MED ORDER — ONDANSETRON HCL 4 MG/2ML IJ SOLN
INTRAMUSCULAR | Status: DC | PRN
  Administered 2018-11-25: 4 mg via INTRAVENOUS

## 2018-11-25 MED ORDER — EPHEDRINE 5 MG/ML INJ
INTRAVENOUS | Status: AC
  Filled 2018-11-25: qty 10

## 2018-11-25 MED ORDER — SODIUM CHLORIDE 0.9% FLUSH: 3.0000 mL | INTRAVENOUS | Status: DC | PRN

## 2018-11-25 MED ORDER — EZETIMIBE 10 MG PO TABS
10.0000 mg | ORAL_TABLET | Freq: Every evening | ORAL | Status: DC
  Administered 2018-11-25: 10 mg via ORAL
  Filled 2018-11-25: qty 1

## 2018-11-25 MED ORDER — OXYCODONE HCL 5 MG PO TABS: 5.0000 mg | ORAL_TABLET | ORAL | Status: DC | PRN

## 2018-11-25 MED ORDER — ACETAMINOPHEN 325 MG PO TABS
650.0000 mg | ORAL_TABLET | Freq: Four times a day (QID) | ORAL | Status: DC | PRN
  Administered 2018-11-25: 650 mg via ORAL
  Filled 2018-11-25: qty 2

## 2018-11-25 MED ORDER — SODIUM CHLORIDE 0.9 % IV SOLN
1.5000 g | Freq: Two times a day (BID) | INTRAVENOUS | Status: DC
  Filled 2018-11-25 (×2): qty 1.5

## 2018-11-25 SURGICAL SUPPLY — 87 items
ADH SKN CLS APL DERMABOND .7 (GAUZE/BANDAGES/DRESSINGS) ×2
APL PRP STRL LF DISP 70% ISPRP (MISCELLANEOUS) ×2
BAG DECANTER FOR FLEXI CONT (MISCELLANEOUS) IMPLANT
BAG SNAP BAND KOVER 36X36 (MISCELLANEOUS) ×3 IMPLANT
BALLN TRUE 22X4.5 (BALLOONS) ×3
BALLOON TRUE 22X4.5 (BALLOONS) IMPLANT
BLADE CLIPPER SURG (BLADE) IMPLANT
BLADE STERNUM SYSTEM 6 (BLADE) IMPLANT
BLADE SURG 10 STRL SS (BLADE) IMPLANT
CABLE ADAPT CONN TEMP 6FT (ADAPTER) ×3 IMPLANT
CANISTER SUCT 3000ML PPV (MISCELLANEOUS) IMPLANT
CATH DIAG EXPO 6F VENT PIG 145 (CATHETERS) ×6 IMPLANT
CATH EXPO 5FR AL1 (CATHETERS) IMPLANT
CATH EXTERNAL FEMALE PUREWICK (CATHETERS) IMPLANT
CATH INFINITI 6F AL2 (CATHETERS) IMPLANT
CATH S G BIP PACING (CATHETERS) ×3 IMPLANT
CHLORAPREP W/TINT 26 (MISCELLANEOUS) ×3 IMPLANT
CLIP VESOCCLUDE MED 24/CT (CLIP) IMPLANT
CLIP VESOCCLUDE SM WIDE 24/CT (CLIP) IMPLANT
CONT SPEC 4OZ CLIKSEAL STRL BL (MISCELLANEOUS) ×6 IMPLANT
COVER BACK TABLE 80X110 HD (DRAPES) IMPLANT
COVER WAND RF STERILE (DRAPES) ×3 IMPLANT
DECANTER SPIKE VIAL GLASS SM (MISCELLANEOUS) ×3 IMPLANT
DERMABOND ADVANCED (GAUZE/BANDAGES/DRESSINGS) ×1
DERMABOND ADVANCED .7 DNX12 (GAUZE/BANDAGES/DRESSINGS) ×2 IMPLANT
DEVICE CLOSURE PERCLS PRGLD 6F (VASCULAR PRODUCTS) ×4 IMPLANT
DRAPE INCISE IOBAN 66X45 STRL (DRAPES) IMPLANT
DRSG TEGADERM 4X4.75 (GAUZE/BANDAGES/DRESSINGS) ×5 IMPLANT
ELECT CAUTERY BLADE 6.4 (BLADE) IMPLANT
ELECT REM PT RETURN 9FT ADLT (ELECTROSURGICAL) ×6
ELECTRODE REM PT RTRN 9FT ADLT (ELECTROSURGICAL) ×4 IMPLANT
FELT TEFLON 6X6 (MISCELLANEOUS) ×3 IMPLANT
GAUZE SPONGE 4X4 12PLY STRL (GAUZE/BANDAGES/DRESSINGS) ×3 IMPLANT
GLOVE BIO SURGEON STRL SZ7.5 (GLOVE) IMPLANT
GLOVE BIO SURGEON STRL SZ8 (GLOVE) IMPLANT
GLOVE EUDERMIC 7 POWDERFREE (GLOVE) IMPLANT
GLOVE ORTHO TXT STRL SZ7.5 (GLOVE) IMPLANT
GOWN STRL REUS W/ TWL LRG LVL3 (GOWN DISPOSABLE) IMPLANT
GOWN STRL REUS W/ TWL XL LVL3 (GOWN DISPOSABLE) ×2 IMPLANT
GOWN STRL REUS W/TWL LRG LVL3 (GOWN DISPOSABLE)
GOWN STRL REUS W/TWL XL LVL3 (GOWN DISPOSABLE) ×3
GUIDEWIRE SAF TJ AMPL .035X180 (WIRE) ×3 IMPLANT
GUIDEWIRE SAFE TJ AMPLATZ EXST (WIRE) ×3 IMPLANT
GUIDEWIRE STRAIGHT .035 260CM (WIRE) ×3 IMPLANT
INSERT FOGARTY SM (MISCELLANEOUS) IMPLANT
KIT BASIN OR (CUSTOM PROCEDURE TRAY) ×3 IMPLANT
KIT ENCORE 26 ADVANTAGE (KITS) ×1 IMPLANT
KIT HEART LEFT (KITS) ×3 IMPLANT
KIT SUCTION CATH 14FR (SUCTIONS) IMPLANT
KIT TURNOVER KIT B (KITS) ×3 IMPLANT
LOOP VESSEL MAXI BLUE (MISCELLANEOUS) IMPLANT
LOOP VESSEL MINI RED (MISCELLANEOUS) IMPLANT
NS IRRIG 1000ML POUR BTL (IV SOLUTION) ×3 IMPLANT
PACK ENDO MINOR (CUSTOM PROCEDURE TRAY) ×3 IMPLANT
PAD ARMBOARD 7.5X6 YLW CONV (MISCELLANEOUS) ×6 IMPLANT
PAD ELECT DEFIB RADIOL ZOLL (MISCELLANEOUS) ×3 IMPLANT
PENCIL BUTTON HOLSTER BLD 10FT (ELECTRODE) IMPLANT
PERCLOSE PROGLIDE 6F (VASCULAR PRODUCTS) ×6
POSITIONER HEAD DONUT 9IN (MISCELLANEOUS) ×3 IMPLANT
SET MICROPUNCTURE 5F STIFF (MISCELLANEOUS) ×3 IMPLANT
SHEATH BRITE TIP 6FR 35CM (SHEATH) ×1 IMPLANT
SHEATH BRITE TIP 7FR 35CM (SHEATH) ×3 IMPLANT
SHEATH PINNACLE 6F 10CM (SHEATH) ×3 IMPLANT
SHEATH PINNACLE 8F 10CM (SHEATH) ×3 IMPLANT
SLEEVE REPOSITIONING LENGTH 30 (MISCELLANEOUS) ×3 IMPLANT
STOPCOCK MORSE 400PSI 3WAY (MISCELLANEOUS) ×6 IMPLANT
SUT ETHIBOND X763 2 0 SH 1 (SUTURE) IMPLANT
SUT GORETEX CV 4 TH 22 36 (SUTURE) IMPLANT
SUT GORETEX CV4 TH-18 (SUTURE) IMPLANT
SUT MNCRL AB 3-0 PS2 18 (SUTURE) IMPLANT
SUT PROLENE 5 0 C 1 36 (SUTURE) IMPLANT
SUT PROLENE 6 0 C 1 30 (SUTURE) IMPLANT
SUT SILK  1 MH (SUTURE) ×1
SUT SILK 1 MH (SUTURE) ×2 IMPLANT
SUT VIC AB 2-0 CT1 27 (SUTURE)
SUT VIC AB 2-0 CT1 TAPERPNT 27 (SUTURE) IMPLANT
SUT VIC AB 2-0 CTX 36 (SUTURE) IMPLANT
SUT VIC AB 3-0 SH 8-18 (SUTURE) IMPLANT
SYR 50ML LL SCALE MARK (SYRINGE) ×3 IMPLANT
SYR BULB IRRIGATION 50ML (SYRINGE) IMPLANT
SYR MEDRAD MARK V 150ML (SYRINGE) ×3 IMPLANT
TOWEL GREEN STERILE (TOWEL DISPOSABLE) ×6 IMPLANT
TRANSDUCER W/STOPCOCK (MISCELLANEOUS) ×6 IMPLANT
TRAY FOLEY SLVR 14FR TEMP STAT (SET/KITS/TRAYS/PACK) IMPLANT
TUBE SUCT INTRACARD DLP 20F (MISCELLANEOUS) IMPLANT
VALVE HEART TRANSCATH SZ3 23MM (Valve) ×1 IMPLANT
WIRE .035 3MM-J 145CM (WIRE) ×3 IMPLANT

## 2018-11-25 NOTE — Interval H&P Note (Signed)
History and Physical Interval Note:  11/25/2018 9:39 AM  Eric [name]  has presented today for surgery, with the diagnosis of Severe Aortic Insufficiency.  The various methods of treatment have been discussed with the patient and family. After consideration of risks, benefits and other options for treatment, the patient has consented to  Procedure(s): TRANSCATHETER AORTIC VALVE REPLACEMENT, TRANSFEMORAL (N/A) TRANSESOPHAGEAL ECHOCARDIOGRAM (TEE) (N/A) as a surgical intervention.  The patient's history has been reviewed, patient examined, no change in status, stable for surgery.  I have reviewed the patient's chart and labs.  Questions were answered to the patient's satisfaction.     Shortness of breath: Yes.   If yes: with what activity?: any activity Worse than previously noted?: No.  New edema, PND, orthopnea: Yes.    Recent decrease in activity i.e. more difficulty walking to mailbox, climbing stairs, etc: No.  Changes in sleeping i.e. need to utilize to sleep on more pillows, sitting up, etc: No.  Changes since last seen in pre-op visit: Yes.   More swelling in legs.  [name]

## 2018-11-25 NOTE — Anesthesia Procedure Notes (Signed)
Procedure Name: MAC Performed by: Jameil Whitmoyer B, CRNA Pre-anesthesia Checklist: Patient identified, Emergency Drugs available, Suction available, Patient being monitored and Timeout performed Patient Re-evaluated:Patient Re-evaluated prior to induction Oxygen Delivery Method: Simple face mask Preoxygenation: Pre-oxygenation with 100% oxygen Induction Type: IV induction Placement Confirmation: positive ETCO2 Dental Injury: Teeth and Oropharynx as per pre-operative assessment        

## 2018-11-25 NOTE — Progress Notes (Signed)
  [place] VALVE TEAM  Patient doing well s/p TAVR. He is hemodynamically stable. Groin sites stable. ECG with afib with slow AVR but no high grade block. Arterial line discontinued and transferred  to 4E. Plan for early ambulation after bedrest completed and hopeful discharge over the next 24-48 hours.   Angelena Form PA-C  [facility]  Pager [phone]

## 2018-11-25 NOTE — Progress Notes (Signed)
Patient ID: Eric [name], male   DOB: 05-08-1930, 85 y.o.   MRN: [id number] TCTS:  Hemodynamically stable  Rhythm is atrial fib. Has been 40-50's. One 2.3 sec pause. Feels well Groin sites look good.

## 2018-11-25 NOTE — Op Note (Signed)
[facility]   MULTIDISCIPLINARY HEART VALVE TEAM   TAVR OPERATIVE NOTE   Date of Procedure:  11/25/2018  Preoperative Diagnosis: Severe Aortic Stenosis   Postoperative Diagnosis: Same   Procedure:    Transcatheter Aortic Valve Replacement - Percutaneous Transfemoral Approach  Edwards Sapien 3 THV (size 23 mm, model # 9600TFX, serial # [id number])   Co-Surgeons:  [name], MD and [name], MD  Anesthesiologist:  Tamela Gammon, MD  Echocardiographer:  Ena Dawley, MD  Pre-operative Echo Findings:  Severe bioprosthetic aortic insufficiency  Normal left ventricular systolic function  Post-operative Echo Findings:  Mild paravalvular leak  Normal/unchanged left ventricular systolic function  BRIEF CLINICAL NOTE AND INDICATIONS FOR SURGERY  This is an 85 year old male status post aortic valve replacement with a 23 mm Mitroflow pericardial valve, mitral valve repair with ring annuloplasty, and single-vessel CABG in 2009.  Other comorbid conditions include paroxysmal atrial fibrillation, recurrent DVT/PE, prostate cancer status post radical prostatectomy, and bladder cancer status post TURBT.  The patient has developed severe prosthetic aortic valve insufficiency and after undergoing extensive multidisciplinary review of his case, he presents today for valve in valve TAVR.  Preoperative cardiac catheterization demonstrated single-vessel coronary artery disease involving the left circumflex with continued patency of the saphenous vein graft to first OM.  He had normal right heart pressures.  CT angiography demonstrated suitable anatomy for valve-in-valve TAVR.  He was noted to have a low right coronary artery at risk of occlusion, and we therefore elected to place a wire and stent into the RCA in order to protect it.  During the course of the patient's preoperative work up they have been evaluated comprehensively by a multidisciplinary team of specialists  coordinated through the [facility] in the [facility].  They have been demonstrated to suffer from symptomatic severe aortic insufficiency as noted above. The patient has been counseled extensively as to the relative risks and benefits of all options for the treatment of severe aortic stenosis including long term medical therapy, conventional surgery for aortic valve replacement, and transcatheter aortic valve replacement.  The patient is independently evaluated by Dr. [name] preoperatively.   Based upon review of all of the patient's preoperative diagnostic tests they are felt to be candidate for transcatheter aortic valve replacement using the transfemoral approach as an alternative to high risk redo conventional surgery.    Following the decision to proceed with transcatheter aortic valve replacement, a discussion has been held regarding what types of management strategies would be attempted intraoperatively in the event of life-threatening complications, including whether or not the patient would be considered a candidate for the use of cardiopulmonary bypass and/or conversion to open sternotomy for attempted surgical intervention.  The patient has been advised of a variety of complications that might develop peculiar to this approach including but not limited to risks of death, stroke, paravalvular leak, aortic dissection or other major vascular complications, aortic annulus rupture, device embolization, cardiac rupture or perforation, acute myocardial infarction, arrhythmia, heart block or bradycardia requiring permanent pacemaker placement, congestive heart failure, respiratory failure, renal failure, pneumonia, infection, other late complications related to structural valve deterioration or migration, or other complications that might ultimately cause a temporary or permanent loss of functional independence or other long term morbidity.  The patient  provides full informed consent for the procedure as described and all questions were answered preoperatively.  DETAILS OF THE OPERATIVE PROCEDURE  PREPARATION:   The patient is brought  to the operating room on the above mentioned date and central monitoring was established by the anesthesia team including placement of a central venous catheter and radial arterial line. The patient is placed in the supine position on the operating table.  Intravenous antibiotics are administered. The patient is monitored closely throughout the procedure under conscious sedation.  Baseline transthoracic echocardiogram is performed. The patient's chest, abdomen, both groins, and both lower extremities are prepared and draped in a sterile manner. A time out procedure is performed.   PERIPHERAL ACCESS:   Using ultrasound guidance, femoral arterial and venous access is obtained with placement of 6 Fr sheaths on the left side.  A pigtail diagnostic catheter was passed through the femoral arterial sheath under fluoroscopic guidance into the aortic root.  A temporary transvenous pacemaker catheter was passed through the femoral venous sheath under fluoroscopic guidance into the right ventricle.  The pacemaker was tested to ensure stable lead placement and pacemaker capture. Aortic root angiography was performed in order to determine the optimal angiographic angle for valve deployment.  TRANSFEMORAL ACCESS:  A micropuncture technique is used to access the right femoral artery under fluoroscopic and ultrasound guidance.  2 Perclose devices are deployed at 10' and 2' positions to 'PreClose' the femoral artery. An 8 French sheath is placed and then an Amplatz Superstiff wire is advanced through the sheath. This is changed out for a 14 French transfemoral E-Sheath after progressively dilating over the Superstiff wire.  An AL-2 catheter was used to direct a straight-tip exchange length wire across the native aortic valve into the  left ventricle. This was exchanged out for a pigtail catheter and position was confirmed in the LV apex. Simultaneous LV and Ao pressures were recorded.  The pigtail catheter was exchanged for an Amplatz Extra-stiff wire in the LV apex.  Echocardiography was utilized to confirm appropriate wire position and no sign of entanglement in the mitral subvalvular apparatus.  RCA PROTECTION: A 6 French 3 [facility] guide catheter is inserted through the left femoral arterial sheath.  Selective coronary angiography is performed.  Heparin is administered and a therapeutic ACT is achieved.  A cougar guidewire is advanced without difficulty into the distal RCA.  A Medtronic resolute onyx 3.0 x 12 mm drug-eluting stent is advanced into the midportion of the RCA.  The guide catheter is then withdrawn into the ascending aorta in order to allow plenty of room for valve deployment.  BALLOON AORTIC VALVULOPLASTY:  Not performed  TRANSCATHETER HEART VALVE DEPLOYMENT:  An Edwards Sapien 3 transcatheter heart valve (size 23 mm, model #9600TFX, serial #[id number]) was prepared and crimped per manufacturer's guidelines, and the proper orientation of the valve is confirmed on the [employer] delivery system. The valve was advanced through the introducer sheath using normal technique until in an appropriate position in the abdominal aorta beyond the sheath tip. The balloon was then retracted and using the fine-tuning wheel was centered on the valve. The valve was then advanced across the aortic arch using appropriate flexion of the catheter. The valve was carefully positioned across the aortic valve annulus. The Commander catheter was retracted using normal technique. Once final position of the valve has been confirmed by angiographic assessment, the valve is deployed while temporarily holding ventilation and during rapid ventricular pacing to maintain systolic blood pressure < 50 mmHg and pulse pressure < 10 mmHg. The balloon  inflation is held for >3 seconds after reaching full deployment volume. Once the balloon has fully deflated the balloon is  retracted into the ascending aorta and valve function is assessed using echocardiography. There is felt to be mild paravalvular leak and no central aortic insufficiency.  The patient's hemodynamic recovery following valve deployment is good.  At that point we elected to attempt to fracture the sewing ring of the Mitroflow valve.  The commander catheter is removed, leaving the extra-stiff wire in the LV apex.  A 22 mm true balloon is advanced across the newly implanted TAVR valve.  Rapid pacing is again performed and the true balloon is dilated to greater than 20 atm until there is visual evidence of sewing ring fracture and improved stent frame expansion.  The deployment balloon and guidewire are both removed. Echo demostrated acceptable post-procedural gradients, stable mitral valve function, and mild aortic insufficiency.  The RCA stent is then removed undeployed as the RCA remained patent.  The guide catheter is removed.  PROCEDURE COMPLETION:  The sheath was removed and femoral artery closure is performed using the 2 previously deployed Perclose devices.  Protamine is administered once femoral arterial repair was complete. The site is clear with no evidence of bleeding or hematoma after the sutures are tightened. The temporary pacemaker, pigtail catheters and femoral sheaths were removed with manual pressure used for hemostasis.   The patient tolerated the procedure well and is transported to the surgical intensive care in stable condition. There were no immediate intraoperative complications. All sponge instrument and needle counts are verified correct at completion of the operation.   The patient received a total of 10 mL of intravenous contrast during the procedure.   [name], MD 11/25/2018 3:58 PM

## 2018-11-25 NOTE — Anesthesia Postprocedure Evaluation (Signed)
Anesthesia Post Note  Patient: CLEBURN MAIOLO  Procedure(s) Performed: TRANSCATHETER AORTIC VALVE REPLACEMENT, TRANSFEMORAL (N/A Chest) TRANSESOPHAGEAL ECHOCARDIOGRAM (TEE) (N/A )     Patient location during evaluation: PACU Anesthesia Type: MAC Level of consciousness: awake and alert Pain management: pain level controlled Vital Signs Assessment: post-procedure vital signs reviewed and stable Respiratory status: spontaneous breathing and respiratory function stable Cardiovascular status: stable Postop Assessment: no apparent nausea or vomiting Anesthetic complications: no    Last Vitals:  Vitals:   11/25/18 1440 11/25/18 1519  BP: 140/69 133/77  Pulse: (!) 51 (!) 57  Resp: 17 15  Temp:  (!) 36.4 C  SpO2: 98% 98%    Last Pain:  Vitals:   11/25/18 1519  TempSrc: Axillary  PainSc: 0-No pain                 Scarlette Hogston DANIEL

## 2018-11-25 NOTE — Op Note (Signed)
[facility]   MULTIDISCIPLINARY HEART VALVE TEAM   TAVR OPERATIVE NOTE   Date of Procedure:  11/25/2018  Preoperative Diagnosis: Severe Aortic Stenosis   Postoperative Diagnosis: Same   Procedure:    Valve in ValveTranscatheter Aortic Valve Replacement - Percutaneous right Transfemoral Approach  Edwards Sapien 3 THV (size 23 mm, model # 9600TFX, serial # [id number])   Co-Surgeons:  [name], MD and [name], MD   Anesthesiologist:  Tamela Gammon, MD  Echocardiographer:  Ena Dawley, MD.  Pre-operative Echo Findings:  Severe prosthetic aortic valve insulfficiency  Normal left ventricular systolic function  Post-operative Echo Findings:  Residual focal paravalvular leak around old bioprosthetic valve  Normal left ventricular systolic function   BRIEF CLINICAL NOTE AND INDICATIONS FOR SURGERY  This 85 year old gentleman has severe prosthetic aortic valve insufficiency with New York Heart Association class II symptoms of exertional fatigue and shortness of breath consistent with chronic systolic congestive heart failure. I have personally reviewed TEE, cardiac catheterization, and CTA studies. TEE shows severe prosthetic aortic insufficiency with low normal left ventricular systolic function with ejection fraction of 50 to 55%. There is mild mitral regurgitation. 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 Right ventricular systolic function is normal. Cardiac catheterization shows severe single-vessel coronary disease involving the proximal left circumflex coronary artery with a patent saphenous vein graft to the first obtuse marginal branch. The remainder of his coronary arteries are without significant disease. His 23 mm Mitroflow aortic valve prosthesis is suitable for valve in valve transcatheter aortic valve replacement using a 23 mm Sapien 3 valve with fracturing of the valve ring of the Mitroflow prosthesis. The right  coronary artery does appear to sit behind the right leaflet of the Mitroflow prosthesis may be at increased risk for occlusion. We may need to consider protection of the right coronary artery with a stent. His abdominal and pelvic CTA shows adequate pelvic vascular anatomy to allow transfemoral insertion.  The patient and his wife were counseled at length regarding treatment alternatives for management of severe symptomatic prosthetic valve insufficiency. The risks and benefits of surgical intervention has been discussed in detail. Long-term prognosis with medical therapy was discussed. Alternative approaches such as conventional surgical aortic valve replacement, transcatheter aortic valve replacement, and palliative medical therapy were compared and contrasted at length. This discussion was placed in the context of the patient's own specific clinical presentation and past medical history. All of their questions have been addressed.   Following the decision to proceed with transcatheter aortic valve replacement, a discussion was held regarding what types of management strategies would be attempted intraoperatively in the event of life-threatening complications, including whether or not the patient would be considered a candidate for the use of cardiopulmonary bypass and/or conversion to open sternotomy for attempted surgical intervention. The patient is aware of the fact that transient use of cardiopulmonary bypass may be necessary but given his prior cardiac surgery I do not think he would be a candidate for emergent sternotomy to manage any intraoperative complications.   The patient has been advised of a variety of complications that might develop including but not limited to risks of death, stroke, paravalvular leak, aortic dissection or other major vascular complications, aortic annulus rupture, device embolization, cardiac rupture or perforation, mitral regurgitation, acute myocardial infarction,  arrhythmia, heart block or bradycardia requiring permanent pacemaker placement, congestive heart failure, respiratory failure, renal failure, pneumonia, infection, other late complications related to structural valve deterioration or migration, or  other complications that might ultimately cause a temporary or permanent loss of functional independence or other long term morbidity. The patient provides full informed consent for the procedure as described and all questions were answered.     DETAILS OF THE OPERATIVE PROCEDURE  PREPARATION:    The patient is brought to the operating room on the above mentioned date and appropriate monitoring was established by the anesthesia team. The patient is placed in the supine position on the operating table.  Intravenous antibiotics are administered. The patient is monitored closely throughout the procedure under conscious sedation.    Baseline transthoracic echocardiogram was performed. The patient's chest, abdomen, both groins, and both lower extremities are prepared and draped in a sterile manner. A time out procedure is performed.   PERIPHERAL ACCESS:    Using the modified Seldinger technique, femoral arterial and venous access was obtained with placement of 6 Fr sheaths on the left side.  A pigtail diagnostic catheter was passed through the left arterial sheath under fluoroscopic guidance into the aortic root.  A temporary transvenous pacemaker catheter was passed through the left femoral venous sheath under fluoroscopic guidance into the right ventricle.  The pacemaker was tested to ensure stable lead placement and pacemaker capture. A stent was parked in the proximal RCA by Dr. [name] in case the right coronary artery ostium was obstructed by the old bioprosthetic valve leaflets.  TRANSFEMORAL ACCESS:   Percutaneous transfemoral access and sheath placement was performed using ultrasound guidance.  The right common femoral artery was cannulated using a  micropuncture needle and appropriate location was verified using hand injection angiogram.  A pair of Abbott Perclose percutaneous closure devices were placed and a 6 French sheath replaced into the femoral artery.  The patient was heparinized systemically and ACT verified > 250 seconds.    A 14 Fr transfemoral E-sheath was introduced into the right common femoral artery after progressively dilating over an Amplatz superstiff wire. An AL-2 catheter was used to direct a straight-tip exchange length wire across the native aortic valve into the left ventricle. This was exchanged out for a pigtail catheter and position was confirmed in the LV apex. Simultaneous LV and Ao pressures were recorded.  The pigtail catheter was exchanged for an Amplatz Extra-stiff wire in the LV apex.     BALLOON AORTIC VALVULOPLASTY:   Not performed  TRANSCATHETER HEART VALVE DEPLOYMENT:   An Edwards Sapien 3 transcatheter heart valve (size 23 mm, model #9600TFX, serial #[id number]) was prepared and crimped per manufacturer's guidelines, and the proper orientation of the valve is confirmed on the [employer] delivery system. The valve was advanced through the introducer sheath using normal technique until in an appropriate position in the abdominal aorta beyond the sheath tip. The balloon was then retracted and using the fine-tuning wheel was centered on the valve. The valve was then advanced across the aortic arch using appropriate flexion of the catheter. Fluoroscopy was positioned so that the old valve ring formed a figure of 8 shape.  The valve was carefully positioned across the old bioprosthetic valve ring.The Commander catheter was retracted using normal technique. The valve is deployed during rapid ventricular pacing to maintain systolic blood pressure < 50 mmHg and pulse pressure < 10 mmHg. The balloon inflation is held for >3 seconds after reaching full deployment volume. Once the balloon has fully deflated the balloon  is retracted into the ascending aorta and valve function is assessed using echocardiography. There is felt to be focal paravalvular leak  around the old bioprosthetic valve ring and no central aortic insufficiency. There was a slight waist to the valve stent and we decided to fracture the old bioprosthetic ring. A 22 x 4.5 True balloon was inserted and advanced across the valve. With rapid ventricular pacing to maintain systolic blood pressure < 50 mmHg and pulse pressure < 10 mmHg the balloon was inflated to a maximum of 20 ATM with full expansion of the Sapien valve and fracture of the ring. The patient's hemodynamic recovery following post dilitation is good.  The balloon and guidewire are both removed. TEE showed acceptable valve mean gradient of 5 mm Hg with residual focal paravalvular leak around the original bioprosthetic valve. The RCA stent was removed undeployed without difficulty.    PROCEDURE COMPLETION:   The sheath was removed and femoral artery closure performed.  Protamine was administered once femoral arterial repair was complete. The temporary pacemaker, pigtail catheters and femoral sheaths were removed with manual pressure used for hemostasis.    The patient tolerated the procedure well and is transported to the cath lab recovery area in stable condition. There were no immediate intraoperative complications. All sponge instrument and needle counts are verified correct at completion of the operation.   No blood products were administered during the operation.  The patient received no intravenous contrast during the procedure.   [name], MD 11/25/2018 3:46 PM

## 2018-11-25 NOTE — Progress Notes (Signed)
  Echocardiogram 2D Echocardiogram has been performed.  Eric [name] 11/25/2018, 1:06 PM

## 2018-11-25 NOTE — Progress Notes (Signed)
11/25/2018 1500 Received pt to room 4E-13 from PACU.  Pt is A&O, neuro intact.  Tele monitor applied and CCMD notified.  CHG bath given.  Both groin sites are level 0, reminded pt to remain on bedrest till 1830.  Oriented to room, call light and bed.  Call bell in reach. [place]

## 2018-11-25 NOTE — Plan of Care (Signed)
Poc progressing.  

## 2018-11-25 NOTE — Progress Notes (Signed)
Rt radial arterial line d/c'ed, manual pressure held x 5 minutes. Level 0. Gauze and tegaderm dressing.t radial pulse.

## 2018-11-25 NOTE — H&P (Signed)
Eric [name],Eric [name]             [phone]      Cardiothoracic Surgery Admission History and Physical   Referring Provider is Jettie Booze, MD  Primary Cardiologist is [name], MD  PCP is [name], MD      Chief Complaint  Patient presents with  . Prosthetic aortic insufficiency      HPI:  The patient is an 85 year old gentleman with a history of aortic valve replacement using a 23 mm Mitroflow pericardial valve, mitral valve repair with annuloplasty and single-vessel coronary bypass in 2009 by me. He has a history of paroxysmal atrial fib/flutter and recurrent DVT and PE treated with Eliquis, prostate cancer status post radical prostatectomy in 2000, and bladder cancer status post TURBT in 2017. He has been followed over the years by Dr. Irish Lack and is done well until he was admitted to an outside hospital in [place] in 03/2018 for atrial fibrillation and heart failure. He was noted to have moderate central aortic insufficiency by echo at that time. Since that time he has been placed back on Lasix but has continued to have progressive exertional shortness of breath and fatigue. Lab work done in 07/2018 showed the possibility of low-grade hemolysis and a TEE was considered but postponed due to the coronavirus pandemic. He eventually had a TEE performed on 10/22/2018 which showed severe prosthetic aortic insufficiency. The aortic valve mean gradient was 15 mmHg with a peak gradient of 27 mmHg. There was trivial MR. There is moderate to severe TR. Left ventricular ejection fraction was 50 to 55%. He was seen by Dr. [name] for consideration of transcatheter aortic valve placement and underwent cardiac catheterization on 10/29/2018. This showed single-vessel coronary disease with a severe stenosis of the proximal left circumflex and continued patency of the saphenous vein graft to the first OM. His coronaries were patent  otherwise. Mean gradient across aortic valve was 10 mmHg. Right heart pressures were normal. LVEDP was 10 mmHg.   He is retired and lives in [place] with his wife after moving from [place] about 20 years ago. He has a daughter in [place] and his son in [place]. He reports progressive exertional fatigue and shortness of breath. He denies any chest pain or pressure. He has had no orthopnea or PND. Denies dizziness and syncope. He has chronic lower extremity edema.      Past Medical History:  Diagnosis Date  . Atrial flutter, paroxysmal ([place])    a. dx 04-02-2014, s/p successful cardioversion 04-06-2014.  . Bladder tumor   . Chronic anticoagulation    on Eliquis-- due to recurrent dvt's and pe's  . Dyslipidemia   . History of bladder cancer urologist- dr [name]   02-13-2016 s/p TURBT per path high grade papillary urothelial carcinoma  . History of DVT (deep vein thrombosis)    2000-- RLE  . History of melanoma excision    left flank; 07/ 2014 left lower leg and right upper chest  . History of prostate cancer    Gleason 6-- s/p radical prostatectomy 06/ 2000 in [place]--- no recurrence  . History of pulmonary embolus (PE)    2000 & 2005 bilateral  . Hx of valvuloplasty 06/19/2007   a. s/p mitral ring annuloplasty, repair ruptured chordae of P2 flail segments of MV  . S/P aortic valve replacement with bioprosthetic valve    06-19-2007  severe aortic valve stenosis  . Single vessel coronary artery disease cardiologist- dr Irish Lack   a. 05/2007 CABG x 1: s/p SVG-OM; b. 10/2010 Ex MV: EF 72%, inf attenuation w/o ischemia, brief run of PAT with exercise. To  . Thrombocytopenia ([place])   . Thyroid goiter 2013   nodular  . Wears hearing aid    BILATERAL  . Wears partial dentures    UPPER        Past Surgical History:  Procedure Laterality Date  . AORTIC VALVE REPLACEMENT (AVR)/CORONARY ARTERY BYPASS GRAFTING (CABG)  06-19-2007 dr [name]   SVG to OM1 ; AVR w/ #23  Mitralflow pericardial and ligation left atrial appendage; MV repair w/ #28 Sorin 3-D memo Ring Annuloplasty with repair ruptured chordar of P-2 flail segments  . CARDIAC CATHETERIZATION  05-12-2007 dr Leonia Reeves   single vessel 30% ostial LAD, 80% LCx; severe to critial AS, moderate MR, normal LVF, ef 70%, normal right heart pressures and cardiac outputs, mild elevated LV end-diastolic pressure   . CARDIOVASCULAR STRESS TEST  11-02-2010 dr Irish Lack   normal nuclear study w/ no ischemia or infarct/scar/ normal LV function and wall motion , ef 72%  . CARDIOVERSION N/A 04/06/2014   Procedure: CARDIOVERSION; Surgeon: [name], MD; Location: [facility]; Service: Cardiovascular; Laterality: N/A; successful  . CATARACT EXTRACTION W/ INTRAOCULAR LENS IMPLANT, BILATERAL  2012 approx  . RETROPUBIC RADICAL PROSTATECTOMY  06/ 2000 in [place], [place]  . RIGHT/LEFT HEART CATH AND CORONARY/GRAFT ANGIOGRAPHY N/A 10/29/2018   Procedure: RIGHT/LEFT HEART CATH AND CORONARY/GRAFT ANGIOGRAPHY; Surgeon: [name], MD; Location: [facility]; Service: Cardiovascular; Laterality: N/A;  . ROTATOR CUFF REPAIR Left 09/1998  . TEE WITHOUT CARDIOVERSION N/A 04/06/2014   Procedure: TRANSESOPHAGEAL ECHOCARDIOGRAM (TEE); Surgeon: [name], MD; Location: [facility] ENDOSCOPY; Service: Cardiovascular; Laterality: N/A; mild focal basa LVH of the septum, ef 50-55%, s/p AV annuloplasty ring, elongated chordae, thickened leaflets, mild to mod. MR, multiple small jets/arotic bioprosthetic valve sits well in position, mild AI/ thickened TV w/ mild reurg./mild LA  . TEE WITHOUT CARDIOVERSION N/A 10/22/2018   Procedure: TRANSESOPHAGEAL ECHOCARDIOGRAM (TEE); Surgeon: [name], MD; Location: [facility] ENDOSCOPY; Service: Cardiovascular; Laterality: N/A;  . TRANSTHORACIC ECHOCARDIOGRAM  03-23-2016 dr Irish Lack   EF 55-60%, reduced contribution atrial contraction to ventricular filling, due to increased ventricular diastolic  pressure or atrial contratile dysfunction/ bioprosthetic AV w/ mod. regurg. (valve area 1.97cm^2)/ mild dilated ascending aorta/ mild thicken MV normal function annular ring prosthesis/severe LAE & mod. to sev.RAE/ PASP 26mmHg/ mild TR/ ventricle septal motion show paradox  . TRANSURETHRAL RESECTION OF BLADDER TUMOR N/A 02/13/2016   Procedure: TRANSURETHRAL RESECTION OF BLADDER TUMOR (TURBT); Surgeon: Nickie Retort, MD; Location: [facility]; Service: Urology; Laterality: N/A;  . TRANSURETHRAL RESECTION OF BLADDER TUMOR N/A 09/04/2016   Procedure: TRANSURETHRAL RESECTION OF BLADDER TUMOR (TURBT); Surgeon: Nickie Retort, MD; Location: [facility]; Service: Urology; Laterality: N/A;        Family History  Problem Relation Age of Onset  . Leukemia Mother   . Heart attack Neg Hx   . Stroke Neg Hx   . Hypertension Neg Hx    Social History        Socioeconomic History  . Marital status: Married    Spouse name: Not on file  . Number of children: Not on file  . Years of education: Not on file  . Highest education level: Not on file  Occupational History  . Not on file  Social  Needs  . Financial resource strain: Not on file  . Food insecurity    Worry: Not on file    Inability: Not on file  . Transportation needs    Medical: Not on file    Non-medical: Not on file  Tobacco Use  . Smoking status: Never Smoker  . Smokeless tobacco: Never Used  Substance and Sexual Activity  . Alcohol use: Yes    Alcohol/week: 1.0 - 2.0 standard drinks    Types: 1 - 2 Standard drinks or equivalent per week  . Drug use: No  . Sexual activity: Not on file  Lifestyle  . Physical activity    Days per week: Not on file    Minutes per session: Not on file  . Stress: Not on file  Relationships  . Social Herbalist on phone: Not on file    Gets together: Not on file    Attends religious service: Not on file    Active member of club or organization: Not  on file    Attends meetings of clubs or organizations: Not on file    Relationship status: Not on file  . Intimate partner violence    Fear of current or ex partner: Not on file    Emotionally abused: Not on file    Physically abused: Not on file    Forced sexual activity: Not on file  Other Topics Concern  . Not on file  Social History Narrative   Lives in [place] with wife. Active.         Current Outpatient Medications  Medication Sig Dispense Refill  . atorvastatin (LIPITOR) 40 MG tablet TAKE 1 TABLET EVERY EVENING 90 tablet 2  . ELIQUIS 5 MG TABS tablet TAKE 1 TABLET TWICE A DAY 180 tablet 2  . ezetimibe (ZETIA) 10 MG tablet Take 1 tablet (10 mg total) by mouth every evening. 90 tablet 3  . furosemide (LASIX) 20 MG tablet Take 40 mg by mouth daily. Pt may take additional doses if needed for leg swelling    . metoprolol tartrate (LOPRESSOR) 25 MG tablet Take 0.5 tablets (12.5 mg total) by mouth 2 (two) times daily. 90 tablet 3   No current facility-administered medications for this visit.   No Known Allergies  Review of Systems:  General: normal appetite, + decreased energy, no weight gain, no weight loss, no fever  Cardiac: no chest pain with exertion, no chest pain at rest, +SOB with exertion, no resting SOB, no PND, no orthopnea, no palpitations, + arrhythmia, + atrial fibrillation, + LE edema, no dizzy spells, no syncope  Respiratory: + shortness of breath, no home oxygen, no productive cough, no dry cough, no bronchitis, no wheezing, no hemoptysis, no asthma, no pain with inspiration or cough, no sleep apnea, no CPAP at night  GI: no difficulty swallowing, no reflux, no frequent heartburn, no hiatal hernia, no abdominal pain, no constipation, no diarrhea, no hematochezia, no hematemesis, no melena  GU: no dysuria, no frequency, no urinary tract infection, + hematuria in past, not now, no enlarged prostate, no kidney stones, no kidney disease  Vascular: no pain suggestive of  claudication, no pain in feet, no leg cramps, no varicose veins, no DVT, no non-healing foot ulcer  Neuro: no stroke, no TIA's, no seizures, no headaches, no temporary blindness one eye, no slurred speech, no peripheral neuropathy, no chronic pain, no instability of gait, no memory/cognitive dysfunction  Musculoskeletal: no arthritis, no joint swelling, no myalgias, no difficulty  walking, normal mobility  Skin: no rash, no itching, no skin infections, no pressure sores or ulcerations  Psych: no anxiety, no depression, no nervousness, no unusual recent stress  Eyes: no blurry vision, no floaters, no recent vision changes, + wears glasses or contacts  ENT: no hearing loss, no loose or painful teeth, no dentures, last saw dentist this year  Hematologic: + easy bruising, no abnormal bleeding, no clotting disorder, no frequent epistaxis  Endocrine: no diabetes, does not check CBG's at home  Physical Exam:  Ht 6' (1.829 m)  Wt 187 lb (84.8 kg)  BMI 25.36 kg/m  General: Elderly but well-appearing  HEENT: Unremarkable, NCAT, PERLA, EOMI, oropharynx clear, teeth in good condition  Neck: no JVD, no bruits, no adenopathy or thyromegaly  Chest: clear to auscultation, symmetrical breath sounds, no wheezes, no rhonchi  CV: RRR, grade II/VI crescendo/decrescendo murmur heard best at RSB, lll/Vl diastolic murmur at apex  Abdomen: soft, non-tender, no masses or organomegaly  Extremities: warm, well-perfused, pulses palpable in feet, mild LE edema  Rectal/GU Deferred  Neuro: Grossly non-focal and symmetrical throughout  Skin: Clean and dry, no rashes, no breakdown  Diagnostic Tests:  TRANSESOPHOGEAL ECHO REPORT  Patient Name: JIOVANY SCHEFFEL Date of Exam: 10/22/2018  Medical Rec #: [id number] Height: 72.0 in  Accession #: [id number] Weight: 187.0 lb  Date of Birth: 05-16-1930 BSA: 2.07 m  Patient Age: 7 years BP: 126/49 mmHg  Patient Gender: M HR: 74 bpm.  Exam Location: Inpatient  Procedure:  Transesophageal Echo, Color Doppler, Cardiac Doppler and 3D Echo  Indications: I35.1 Nonrheumatic aortic (valve) insufficiency  History: Patient has prior history of Echocardiogram examinations. Prior  CABG Signs/Symptoms: Shortness of Breath. Aortic Valve: A 28  Mitralflow porcine aortic valve bioprosthesis  Sonographer: Darlina Sicilian RDCS  Referring Phys: [place]  Diagnosing Phys: Candee Furbish MD  PROCEDURE: Local oropharyngeal anesthetic was provided with viscous lidocaine. The transesophogeal probe was passed through the esophogus of the patient. The patient developed no complications during the procedure.  IMPRESSIONS  1. The left ventricle has low normal systolic function, with an ejection fraction of 50-55%. Left ventricular diastolic function could not be evaluated. No evidence of left ventricular regional wall motion abnormalities.  2. The right ventricle has normal systolc function. The cavity was normal. There is no increase in right ventricular wall thickness.  3. Left atrial size was mildly dilated.  4. Right atrial size was mildly dilated.  5. Moderately thickened tricuspid valve leaflets.  6. 72mm Sorin mitral valve repair annular ring intact.  7. 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  8. A 28 Mitralflow porcine bioprosthesis valve is present in the aortic position. Echo findings are consistent with intravalvular regurgitation of the aortic prosthesis.  9. Aortic valve regurgitation is severe by color flow Doppler. Mild stenosis of the aortic valve.  10. No vegetation on the aortic valve.  11. The aortic root is normal in size and structure.  12. Previously ligated left atrial appendage.  13. Severe bioprosthetic aortic valve regurgitation.  FINDINGS  Left Ventricle: The left ventricle has low normal systolic function, with an ejection fraction of 50-55%. There is no increase in left ventricular wall thickness.  Left ventricular diastolic function could not be evaluated. No evidence of left  ventricular regional wall motion abnormalities.  Right Ventricle: The right ventricle has normal systolic function. The cavity was normal. There is no increase in right ventricular wall thickness.  Left Atrium: Left atrial  size was mildly dilated.  Right Atrium: Right atrial size was mildly dilated. Right atrial pressure is estimated at 10 mmHg.  Interatrial Septum: No atrial level shunt detected by color flow Doppler.  Pericardium: There is no evidence of pericardial effusion.  Mitral Valve: The mitral valve has been repaired/replaced. Mitral valve regurgitation is mild by color flow Doppler. 43mm Sorin mitral valve repair annular ring intact.  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 Tricuspid valve regurgitation is moderate-severe by color flow Doppler. The tricuspid valve is moderately thickened.  Aortic Valve: The aortic valve has been repaired/replaced Aortic valve regurgitation is severe by color flow Doppler. There is Mild stenosis of the aortic valve. A 28 Mitralflow porcine aortic valve bioprosthesis valve is present in the aortic position.  Echo findings are consistent with intravalvular regurgitation of the aortic prosthesis. There is no evidence of a vegetation on the aortic valve.  Pulmonic Valve: The pulmonic valve was grossly normal. Pulmonic valve regurgitation is not visualized by color flow Doppler.  Aorta: The aortic root is normal in size and structure.  Additional Findings: Severe bioprosthetic aortic valve regurgitation. Previously ligated left atrial appendage.  +-------------+------------++  AORTIC VALVE    +-------------+------------++  AV Vmax: 260.00 cm/s   +-------------+------------++  AV Vmean: 181.000 cm/s  +-------------+------------++  AV VTI: 0.569 m   +-------------+------------++  AV Peak Grad:27.0 mmHg   +-------------+------------++  AV Mean  Grad:15.0 mmHg   +-------------+------------++  AR PHT: 303 msec   +-------------+------------++  +-------------+---------++ +---------------+-----------++  MITRAL VALVE   TRICUSPID VALVE   +-------------+---------++ +---------------+-----------++  MV Peak grad:8.4 mmHg  TR Peak grad: 26.6 mmHg   +-------------+---------++ +---------------+-----------++  MV Mean grad:3.0 mmHg  TR Vmax: 258.00 cm/s  +-------------+---------++ +---------------+-----------++  MV Vmax: 1.45 m/s   +-------------+---------++  MV Vmean: 66.7 cm/s  +-------------+---------++  MV VTI: 0.24 m   +-------------+---------++  Candee Furbish MD  Electronically signed by Candee Furbish MD  Signature Date/Time: 10/22/2018/2:41:47 PM  Physicians  Panel Physicians Referring Physician Case Authorizing Physician  [name], MD (Primary)    Procedures  RIGHT/LEFT HEART CATH AND CORONARY/GRAFT ANGIOGRAPHY  Conclusion  1. Severe bioprosthetic aortic valve insufficiency by echo assessment and mild aortic stenosis with mean gradient 10 mmHg (likely flow related secondary to severe aortic insufficiency)  2. Single-vessel coronary artery disease with severe stenosis of the proximal left circumflex and continued patency of the saphenous vein graft to first OM  3. Patent left main, LAD, and RCA with mild diffuse nonobstructive disease  4. Normal right heart pressures and preserved cardiac output  Recommend: Continued multidisciplinary team evaluation with CTA studies and formal surgical consultation with Dr. [name] in anticipation of possible valve-in-valve TAVR  Recommendations  Antiplatelet/Anticoag Recommend to resume Apixaban, at currently prescribed dose and frequency on 10/30/2018. Concurrent antiplatelet therapy not recommended.  Surgeon Notes    10/22/2018 9:58 AM CV Procedure signed by [name], MD  Indications  Severe aortic insufficiency [I35.1 (ICD-10-CM)]  Procedural  Details  Technical Details INDICATION: 85 year old gentleman with history of bioprosthetic aortic valve replacement, mitral valve repair, and single-vessel coronary bypass with a saphenous vein graft to OM. He presents with progressive shortness of breath and symptoms of congestive heart failure in the setting of severe bioprosthetic aortic valve insufficiency. He is referred for right and left heart catheterization for further evaluation.  PROCEDURAL DETAILS: There was an indwelling IV in a right antecubital vein. Using normal sterile technique, the IV was changed out for a 5 Fr brachial sheath over  a 0.018 inch wire. The right wrist was then prepped, draped, and anesthetized with 1% lidocaine. Using the modified Seldinger technique a 5/6 French Slender sheath was placed in the right radial artery. Intra-arterial verapamil was administered through the radial artery sheath. IV heparin was administered after a JR4 catheter was advanced into the central aorta. A Swan-Ganz catheter was used for the right heart catheterization. Standard protocol was followed for recording of right heart pressures and sampling of oxygen saturations. Fick cardiac output was calculated. Standard Judkins catheters were used for selective coronary angiography. LV pressure is recorded and an aortic valve pullback is performed. Saphenous vein graft angiography is performed with an AL-1 catheter. There were no immediate procedural complications. The patient was transferred to the post catheterization recovery area for further monitoring.    Estimated blood loss <50 mL.   During this procedure medications were administered to achieve and maintain moderate conscious sedation while the patient's heart rate, blood pressure, and oxygen saturation were continuously monitored and I was present face-to-face 100% of this time.  Medications  (Filter: Administrations occurring from 10/29/18 1513 to 10/29/18 1623)          (important)  Continuous medications are totaled by the amount administered until 10/29/18 1623.  Medication Rate/Dose/Volume Action  Date Time   fentaNYL (SUBLIMAZE) injection (mcg) 25 mcg Given 10/29/18 1537   Total dose as of 10/29/18 1623        25 mcg        midazolam (VERSED) injection (mg) 1 mg Given 10/29/18 1537   Total dose as of 10/29/18 1623        1 mg        Heparin (Porcine) in NaCl 1000-0.9 UT/500ML-% SOLN (mL) 500 mL Given 10/29/18 1537   Total dose as of 10/29/18 1623 500 mL Given 1537   1,000 mL        lidocaine (PF) (XYLOCAINE) 1 % injection (mL) 2 mL Given 10/29/18 1538   Total dose as of 10/29/18 1623        2 mL        Radial Cocktail/Verapamil only (mL)  Given 10/29/18 1538   Total dose as of 10/29/18 1623        Cannot be calculated        iohexol (OMNIPAQUE) 350 MG/ML injection (mL) 45 mL Given 10/29/18 1614   Total dose as of 10/29/18 1623        45 mL        heparin injection (Units) 4,500 Units Given 10/29/18 1546   Total dose as of 10/29/18 1623        4,500 Units        Sedation Time  Sedation Time Physician-1: 34 minutes 2 seconds  Coronary Findings  Diagnostic  Dominance: Right  Left Main  Mid LM to Dist LM lesion 25% stenosed  Mid LM to Dist LM lesion is 25% stenosed. The lesion is mildly calcified.  Left Anterior Descending  Vessel is large. The vessel exhibits minimal luminal irregularities. The LAD is a large vessel that courses to the LV apex. The vessel has minor irregularities but no significant stenoses. The first diagonal is large in caliber.  Left Circumflex  Prox Cx lesion 75% stenosed  Prox Cx lesion is 75% stenosed.  Right Coronary Artery  There is mild diffuse disease throughout the vessel.  Mid RCA lesion 40% stenosed  Mid RCA lesion is 40% stenosed. The lesion is moderately calcified.  saphenous Graft to 1st  Mrg  SVG graft was visualized by angiography and is normal in caliber. The graft exhibits no disease. The saphenous vein graft to first  OM is widely patent throughout. There are no significant stenoses. The graft fills the circumflex and retrograde fashion.  Intervention  No interventions have been documented.  Coronary Diagrams  Diagnostic  Dominance: Right   Intervention  Implants     No implant documentation for this case.  Syngo Images  Link to Procedure Log   Show images for CARDIAC CATHETERIZATION Procedure Log  Images on Long Term Storage    Show images for Ramy, Greth "[name]"   Hemo Data   Most Recent Value  Fick Cardiac Output 5.49 L/min  Fick Cardiac Output Index 2.65 (L/min)/BSA  Aortic Mean Gradient 9.56 mmHg  Aortic Peak Gradient 14 mmHg  Aortic Valve Area >3.50  Aortic Value Area Index 1.69 cm2/BSA  RA A Wave -99 mmHg  RA V Wave 7 mmHg  RA Mean 5 mmHg  RV Systolic Pressure 33 mmHg  RV Diastolic Pressure 0 mmHg  RV EDP 3 mmHg  PA Systolic Pressure 37 mmHg  PA Diastolic Pressure 13 mmHg  PA Mean 24 mmHg  PW A Wave -99 mmHg  PW V Wave 24 mmHg  PW Mean 16 mmHg  AO Systolic Pressure 449 mmHg  AO Diastolic Pressure 43 mmHg  AO Mean 78 mmHg  LV Systolic Pressure 201 mmHg  LV Diastolic Pressure 3 mmHg  LV EDP 10 mmHg  AOp Systolic Pressure 007 mmHg  AOp Diastolic Pressure 40 mmHg  AOp Mean Pressure 73 mmHg  LVp Systolic Pressure 121 mmHg  LVp Diastolic Pressure 0 mmHg  LVp EDP Pressure 8 mmHg  QP/QS 1  TPVR Index 9.06 HRUI  TSVR Index 31.32 HRUI  PVR SVR Ratio 0.1  TPVR/TSVR Ratio 0.29   ADDENDUM REPORT: 11/03/2018 17:29  EXAM:  OVER-READ INTERPRETATION CT CHEST  The following report is an over-read performed by radiologist Dr.  Samara Snide [name] on 11/03/2018. This over-read  does not include interpretation of cardiac or coronary anatomy or  pathology. The cardiac CTA interpretation by the cardiologist is  attached.  COMPARISON: 11/11/2006 chest CT angiogram.  FINDINGS:  Please see the separate concurrent chest CT angiogram report for  details.  IMPRESSION:   Please see the separate concurrent chest CT angiogram report for  details.  Electronically Signed  By: Ilona Sorrel M.D.  On: 11/03/2018 17:29   Addended by Sharyn Blitz, MD on 11/03/2018 5:32 PM  Study Result   CLINICAL DATA: AVR Pre Valve in Valve TAVR  EXAM:  Cardiac TAVR CT  TECHNIQUE:  The patient was scanned on a Siemens Force 975 slice scanner. A 120  kV retrospective scan was triggered in the ascending thoracic aorta  at 140 HU's. Gantry rotation speed was 250 msecs and collimation was  .6 mm. No beta blockade or nitro were given. The 3D data set was  reconstructed in 5% intervals of the R-R cycle. Systolic and  diastolic phases were analyzed on a dedicated work station using  MPR, MIP and VRT modes. The patient received 80 cc of contrast.  FINDINGS:  Aortic Valve: There is a 23 mm Mitroflow stented pericardial  bioprosthetic in place. Sewing ring appears intact with no areas of  dehiscence The leaflets are thickened and there appears to be  prolapse of the right coronary cusp.  Aorta: Moderate calcific atherosclerosis No aneurysm. Normal arch  vessels  Sino-tubular Junction: 29 mm  Ascending Thoracic Aorta: 36 mm  Aortic Arch: 29 mm  Descending Thoracic Aorta: 24 mm  Sinus of Valsalva Measurements:  Non-coronary: 29.4 mm  Right - coronary: 28 mm  Left - coronary: 30.6 mm  Coronary Artery Height above Annulus:  Left Main: 10.6 mm above annulus  Right Coronary: 10.9 mm above annulus  Valve ID: 19.2 mm  Area inside sewing ring 334 mm2  There is a patent SVG to the OM branch. The patient has had a mitral  valve repair with annuloplasty ring. The LAA was supposed to have  been ligated but appears patent with no obvious thrombus  IMPRESSION:  1. 23 mm Mitroflow stented pericardial tissue valve with thickened  leaflets and possible prolapse of right cusp.  2. On SA basal view LM is 7.3 mm from valve leaflet border and RCA  is 7.4 mm away However there is  concern for the coronary heights  Even a 20 mm Sapein 3 is 14 mm in height  The RCA appears to rest behind the right leaflet with STJ calcium  and may be at higher risk of occlusion  3. Normal aortic root 3.6 cm  4. Post mitral valve repair with annuloplasty ring  5. LAA is patent Surgical note indicates ligation  6. Patent SVG to OM  Jenkins Rouge  Electronically Signed:  By: Jenkins Rouge M.D.  On: 11/03/2018 15:16   CLINICAL DATA: 85 year old male with severe symptomatic aortic  stenosis. Pre-TAVR evaluation.  EXAM:  CT ANGIOGRAPHY CHEST, ABDOMEN AND PELVIS  TECHNIQUE:  Multidetector CT imaging through the chest, abdomen and pelvis was  performed using the standard protocol during bolus administration of  intravenous contrast. Multiplanar reconstructed images and MIPs were  obtained and reviewed to evaluate the vascular anatomy.  CONTRAST: 135mL OMNIPAQUE IOHEXOL 350 MG/ML SOLN  COMPARISON: 04/23/2018 chest radiograph. 01/17/2016 CT  abdomen/pelvis. 11/11/2006 chest CT angiogram.  FINDINGS:  CTA CHEST FINDINGS  Cardiovascular: Mild cardiomegaly. Thickened aortic valve with  aortic and mitral annuloplasty rings in place. Three-vessel coronary  atherosclerosis status post CABG with ascending aortic bypass graft  to a circumflex branch. No significant pericardial  effusion/thickening. Atherosclerotic nonaneurysmal thoracic aorta  noting mild atherosclerotic calcification in the ascending thoracic  aorta. Top-normal caliber main pulmonary artery (3.2 cm diameter).  No central pulmonary emboli.  Mediastinum/Nodes: Heterogeneously enhancing 3.1 cm right thyroid  lobe nodule, not substantially changed in size since 2008 CT.  Unremarkable esophagus. No pathologically enlarged axillary,  mediastinal or hilar lymph nodes.  Lungs/Pleura: No pneumothorax. No pleural effusion. No acute  consolidative airspace disease, lung masses or significant pulmonary  nodules. Patchy mild subpleural  reticulation and ground-glass  attenuation throughout both lungs with a basilar predominance,  without significant regions traction bronchiectasis or frank  honeycombing, for which comparison to 2008 chest CT is limited by  the presence of pleural effusions and atelectasis on the prior scan.  Musculoskeletal: No aggressive appearing focal osseous lesions.  Intact sternotomy wires. Moderate thoracic spondylosis.  CTA ABDOMEN AND PELVIS FINDINGS  Hepatobiliary: Normal liver with no liver mass. Normal gallbladder  with no radiopaque cholelithiasis. No biliary ductal dilatation.  Pancreas: Normal, with no mass or duct dilation.  Spleen: Normal size. No mass.  Adrenals/Urinary Tract: Normal adrenals. Normal kidneys with no  hydronephrosis and no contour deforming renal mass. Nonspecific  eccentric left bladder wall thickening. No significant bladder  distention.  Stomach/Bowel: Normal non-distended stomach. Normal caliber small  bowel with no small bowel wall thickening. Normal appendix. Moderate  sigmoid  diverticulosis, with no large bowel wall thickening or  significant pericolonic stranding.  Vascular/Lymphatic: Abdominal aorta with ectatic 2.8 cm infrarenal  abdominal aorta. Patent renal and splenic veins. No pathologically  enlarged lymph nodes in the abdomen or pelvis.  Reproductive: Status post prostatectomy.  Other: No pneumoperitoneum, ascites or focal fluid collection.  Musculoskeletal: No aggressive appearing focal osseous lesions.  Moderate lumbar spondylosis  VASCULAR MEASUREMENTS PERTINENT TO TAVR:  AORTA:  Minimal Aortic Diameter-15.9 x 14.5 mm  Severity of Aortic Calcification-severe  RIGHT PELVIS:  Right Common Iliac Artery -  Minimal Diameter-10.8 x 10.5 mm  Tortuosity-moderate to severe  Calcification-mild  Right External Iliac Artery -  Minimal Diameter-9.1 x 8.8 mm  Tortuosity-severe  Calcification-minimal  Right Common Femoral Artery -  Minimal Diameter-9.7 x  6.1 mm  Tortuosity-mild  Calcification-severe  LEFT PELVIS:  Left Common Iliac Artery -  Minimal Diameter-14.1 x 8.6 mm  Tortuosity-severe  Calcification-mild-to-moderate  Left External Iliac Artery -  Minimal Diameter-9.1 x 8.9 mm  Tortuosity-mild-to-moderate  Calcification-minimal  Left Common Femoral Artery -  Minimal Diameter-8.9 x 7.3 mm  Tortuosity-mild  Calcification-moderate  Review of the MIP images confirms the above findings.  IMPRESSION:  1. Vascular findings and measurements pertinent to potential TAVR  procedure, as detailed.  2. Thickening of the aortic valve, compatible with the reported  history of severe aortic stenosis. Aortic and mitral annuloplasty  rings in place.  3. Mild cardiomegaly.  4. Ectatic 2.8 cm infrarenal abdominal aorta, at risk for aneurysm  development. Recommend follow-up aortic ultrasound in 5 years. This  recommendation follows ACR consensus guidelines: White Paper of the  ACR Incidental Findings Committee II on Vascular Findings. J Am Coll  Radiol 2013; 10:789-794.  5. Right thyroid lobe 3.1 cm nodule, not substantially changed in  size since 2008 chest CT.  6. Nonspecific basilar predominant findings that may indicate an  interstitial lung disease, without significant bronchiectasis or  honeycombing. Pulmonology consultation and follow-up high-resolution  chest CT could be obtained in 3-6 months as clinically warranted.  7. Nonspecific eccentric left bladder wall thickening.  8. Moderate sigmoid diverticulosis.  9. Aortic Atherosclerosis (ICD10-I70.0).  Electronically Signed  By: Ilona Sorrel M.D.  On: 11/03/2018 17:28    STS Risk Calculator:   Procedure: Redo AV Replacement  Risk of Mortality:  6.201%   Renal Failure:  4.485%   Permanent Stroke:  1.924%   Prolonged Ventilation:  13.559%   DSW Infection:  0.158%   Reoperation:  4.700%   Morbidity or Mortality:  21.451%   Short Length of Stay:  18.476%   Long Length of  Stay:  10.179%    Impression:   This 85 year old gentleman has severe prosthetic aortic valve insufficiency with New York Heart Association class II symptoms of exertional fatigue and shortness of breath consistent with chronic systolic congestive heart failure. I have personally reviewed TEE, cardiac catheterization, and CTA studies. TEE shows severe prosthetic aortic insufficiency with low normal left ventricular systolic function with ejection fraction of 50 to 55%. There is mild mitral regurgitation. 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 Right ventricular systolic function is normal. Cardiac catheterization shows severe single-vessel coronary disease involving the proximal left circumflex coronary artery with a patent saphenous vein graft to the first obtuse marginal branch. The remainder of his coronary arteries are without significant disease. His 23 mm Mitroflow aortic valve prosthesis is suitable for valve in valve transcatheter aortic valve replacement using a 23 mm Sapien 3 valve with  fracturing of the valve ring of the Mitroflow prosthesis. The right coronary artery does appear to sit behind the right leaflet of the Mitroflow prosthesis may be at increased risk for occlusion. We may need to consider protection of the right coronary artery with a stent. His abdominal and pelvic CTA shows adequate pelvic vascular anatomy to allow transfemoral insertion.  The patient and his wife were counseled at length regarding treatment alternatives for management of severe symptomatic prosthetic valve insufficiency. The risks and benefits of surgical intervention has been discussed in detail. Long-term prognosis with medical therapy was discussed. Alternative approaches such as conventional surgical aortic valve replacement, transcatheter aortic valve replacement, and palliative medical therapy were compared and contrasted at length. This discussion was placed in the context of  the patient's own specific clinical presentation and past medical history. All of their questions have been addressed.   Following the decision to proceed with transcatheter aortic valve replacement, a discussion was held regarding what types of management strategies would be attempted intraoperatively in the event of life-threatening complications, including whether or not the patient would be considered a candidate for the use of cardiopulmonary bypass and/or conversion to open sternotomy for attempted surgical intervention. The patient is aware of the fact that transient use of cardiopulmonary bypass may be necessary but given his prior cardiac surgery I do not think he would be a candidate for emergent sternotomy to manage any intraoperative complications.   The patient has been advised of a variety of complications that might develop including but not limited to risks of death, stroke, paravalvular leak, aortic dissection or other major vascular complications, aortic annulus rupture, device embolization, cardiac rupture or perforation, mitral regurgitation, acute myocardial infarction, arrhythmia, heart block or bradycardia requiring permanent pacemaker placement, congestive heart failure, respiratory failure, renal failure, pneumonia, infection, other late complications related to structural valve deterioration or migration, or other complications that might ultimately cause a temporary or permanent loss of functional independence or other long term morbidity. The patient provides full informed consent for the procedure as described and all questions were answered.    Plan:   Valve in valve transcatheter aortic valve replacement.  [name], MD

## 2018-11-25 NOTE — Anesthesia Procedure Notes (Signed)
Arterial Line Insertion Start/End7/11/2018 9:35 AM, 11/25/2018 9:37 AM Performed by: Kathryne Hitch, CRNA, CRNA  Patient location: Pre-op. Preanesthetic checklist: patient identified, IV checked, risks and benefits discussed and pre-op evaluation Lidocaine 1% used for infiltration Right, radial was placed Catheter size: 20 G Hand hygiene performed  and maximum sterile barriers used   Attempts: 2 Procedure performed without using ultrasound guided technique. Following insertion, dressing applied and Biopatch. Post procedure assessment: normal  Post procedure complications: local hematoma. Patient tolerated the procedure well with no immediate complications.

## 2018-11-25 NOTE — Transfer of Care (Signed)
Immediate Anesthesia Transfer of Care Note  Patient: Eric [name]  Procedure(s) Performed: TRANSCATHETER AORTIC VALVE REPLACEMENT, TRANSFEMORAL (N/A Chest) TRANSESOPHAGEAL ECHOCARDIOGRAM (TEE) (N/A )  Patient Location: Cath Lab  Anesthesia Type:MAC  Level of Consciousness: drowsy  Airway & Oxygen Therapy: Patient Spontanous Breathing and Patient connected to face mask oxygen  Post-op Assessment: Report given to RN and Post -op Vital signs reviewed and stable  Post vital signs: Reviewed and stable  Last Vitals:  Vitals Value Taken Time  BP    Temp 36.2 C 11/25/18 1328  Pulse    Resp    SpO2      Last Pain:  Vitals:   11/25/18 1328  TempSrc: Temporal  PainSc: Asleep         Complications: No apparent anesthesia complications

## 2018-11-26 ENCOUNTER — Encounter (HOSPITAL_COMMUNITY): Payer: Self-pay | Admitting: Cardiovascular Disease

## 2018-11-26 ENCOUNTER — Other Ambulatory Visit: Payer: Self-pay

## 2018-11-26 ENCOUNTER — Inpatient Hospital Stay (HOSPITAL_COMMUNITY): Payer: Medicare Other

## 2018-11-26 DIAGNOSIS — Z952 Presence of prosthetic heart valve: Secondary | ICD-10-CM

## 2018-11-26 DIAGNOSIS — Z9889 Other specified postprocedural states: Secondary | ICD-10-CM

## 2018-11-26 DIAGNOSIS — I350 Nonrheumatic aortic (valve) stenosis: Secondary | ICD-10-CM

## 2018-11-26 DIAGNOSIS — Z954 Presence of other heart-valve replacement: Secondary | ICD-10-CM

## 2018-11-26 LAB — BASIC METABOLIC PANEL
Anion gap: 9 (ref 5–15)
BUN: 19 mg/dL (ref 8–23)
CO2: 22 mmol/L (ref 22–32)
Calcium: 8.7 mg/dL — ABNORMAL LOW (ref 8.9–10.3)
Chloride: 106 mmol/L (ref 98–111)
Creatinine, Ser: 1.05 mg/dL (ref 0.61–1.24)
GFR calc Af Amer: >60 mL/min (ref >60)
GFR calc non Af Amer: >60 mL/min (ref >60)
Glucose, Bld: 93 mg/dL (ref 70–99)
Potassium: 3.9 mmol/L (ref 3.5–5.1)
Sodium: 137 mmol/L (ref 135–145)

## 2018-11-26 LAB — CBC
HCT: 37.9 % — ABNORMAL LOW (ref 39.0–52.0)
Hemoglobin: 12.7 g/dL — ABNORMAL LOW (ref 13.0–17.0)
MCH: 31 pg (ref 26.0–34.0)
MCHC: 33.5 g/dL (ref 30.0–36.0)
MCV: 92.4 fL (ref 80.0–100.0)
Platelets: 116 10*3/uL — ABNORMAL LOW (ref 150–400)
RBC: 4.1 MIL/uL — ABNORMAL LOW (ref 4.22–5.81)
RDW: 12.3 % (ref 11.5–15.5)
WBC: 5.9 10*3/uL (ref 4.0–10.5)
nRBC: 0 % (ref 0.0–0.2)

## 2018-11-26 LAB — ECHOCARDIOGRAM COMPLETE
Height: 72 in
Weight: 2998.26 oz

## 2018-11-26 LAB — MAGNESIUM: Magnesium: 1.9 mg/dL (ref 1.7–2.4)

## 2018-11-26 MED ORDER — ASPIRIN 81 MG PO CHEW: 81.0000 mg | CHEWABLE_TABLET | Freq: Every day | ORAL | 1 refills | Status: DC

## 2018-11-26 MED ORDER — AMOXICILLIN 500 MG PO TABS: 2000.0000 mg | ORAL_TABLET | ORAL | 12 refills | Status: DC

## 2018-11-26 MED ORDER — LOSARTAN POTASSIUM 25 MG PO TABS
25.0000 mg | ORAL_TABLET | Freq: Every day | ORAL | Status: DC
  Administered 2018-11-26: 25 mg via ORAL
  Filled 2018-11-26: qty 1

## 2018-11-26 MED ORDER — METOPROLOL TARTRATE 12.5 MG HALF TABLET
12.5000 mg | ORAL_TABLET | Freq: Two times a day (BID) | ORAL | Status: DC
  Administered 2018-11-26: 12.5 mg via ORAL
  Filled 2018-11-26: qty 1

## 2018-11-26 MED ORDER — FUROSEMIDE 20 MG PO TABS: 20.0000 mg | ORAL_TABLET | Freq: Every day | ORAL | 12 refills | Status: DC

## 2018-11-26 MED ORDER — LOSARTAN POTASSIUM 25 MG PO TABS: 25.0000 mg | ORAL_TABLET | Freq: Every day | ORAL | 6 refills | Status: DC

## 2018-11-26 MED FILL — LOSARTAN POTASSIUM 25 MG TA: 25 | 30 days supply | Qty: 30 | Fill #0

## 2018-11-26 MED FILL — AMOXICILLIN 500 MG CAPS: 500 | 3 days supply | Qty: 12 | Fill #0

## 2018-11-26 MED FILL — ASPIRIN LOW DOSE 81 MG CHEW: 81 | 90 days supply | Qty: 90 | Fill #0

## 2018-11-26 NOTE — Progress Notes (Signed)
On call cardiology DR. Morningside paged, and notified pt's bp being 182/92, 166/91, 164/85. Since MAP was >100 I called. Pt has Nitro drip as needed. No s/s of distress. Will continue to monitor.

## 2018-11-26 NOTE — Progress Notes (Signed)
CARDIAC REHAB PHASE I   PRE:  Rate/Rhythm: 80 Afib  BP:  Sitting: 147/85      SaO2: 95 RA  MODE:  Ambulation: 370 ft   POST:  Rate/Rhythm: 108 Afib  BP:  Sitting: 177/80    SaO2: 97 RA   Pt ambulated 363ft in hallway assist of one with gait belt and front wheel walker. Pt denies SOB, able to talk throughout the walk. Upon return to bed, BPS noted to be elevated, RN aware. Pt educated on site car, restrictions, and exercise guidelines. Pt given low sodium diets. Will refer to CRP II [place], pt declining [facility] at this time.   [id number] Rufina Falco, RN BSN 11/26/2018 9:57 AM

## 2018-11-26 NOTE — Progress Notes (Signed)
Pt's BP better with nitro drip. See MAR for titration. Complained of headache, po tylenol administered. Will continue to monitor.

## 2018-11-26 NOTE — Discharge Instructions (Signed)
Please monitor your BP over the next week and keep a log. If you are concerned about your blood pressure readings please call [phone]  ACTIVITY AND EXERCISE  Daily activity and exercise are an important part of your recovery. People recover at different rates depending on their general health and type of valve procedure.  Most people recovering from TAVR feel better relatively quickly   No lifting, pushing, pulling more than 10 pounds (examples to avoid: groceries, vacuuming, gardening, golfing):             - For one week with a procedure through the groin.             - For six weeks for procedures through the chest wall or neck NOTE: You will typically see one of our providers 7-14 days after your procedure to discuss [place] the above activities.      DRIVING  Do not drive for until you are seen for follow up and cleared by a provider. Generally, we ask patient to not drive for 1 week after their procedure.  If you have been told by your doctor in the past that you may not drive, you must talk with him/her before you begin driving again.     DRESSING  Groin site: you may leave the clear dressing over the site for up to one week or until it falls off.     HYGIENE  If you had a femoral (leg) procedure, you may take a shower when you return home. After the shower, pat the site dry. Do NOT use powder, oils or lotions in your groin area until the site has completely healed.  If you had a chest procedure, you may shower when you return home unless specifically instructed not to by your discharging practitioner.             - DO NOT scrub incision; pat dry with a towel             - DO NOT apply any lotions, oils, powders to the incision             - No tub baths / swimming for at least 2 weeks.  If you notice any fevers, chills, increased pain, swelling, bleeding or pus, please contact your doctor.   ADDITIONAL INFORMATION  If you are going to have an upcoming dental  procedure, please contact our office as you will require antibiotics ahead of time to prevent infection on your heart valve.    If you have any questions or concerns you can call the structural heart phone during normal business hours 8am-4pm. If you have an urgent need after hours or weekends please call [phone] to talk to the on call provider for general cardiology. If you have an emergency that requires immediate attention, please call 911.    After TAVR Checklist  Check  Test Description   Follow up appointment in 1-2 weeks  You will see our structural heart physician assistant, Nell Range. Your incision sites will be checked and you will be cleared to drive and resume all normal activities if you are doing well.     1 month echo and follow up  You will have an echo to check on your new heart valve and be seen back in the office by Nell Range. Many times the echo is not read by your appointment time, but Joellen Jersey will call you later that day or the following day to report your results.  Follow up with your primary cardiologist You will need to be seen by your primary cardiologist in the following 3-6 months after your 1 month appointment in the valve clinic. Often times your Plavix or Aspirin will be discontinued during this time, but this is decided on a case by case basis.    1 year echo and follow up You will have another echo to check on your heart valve after 1 year and be seen back in the office by Nell Range. This your last structural heart visit.   Bacterial endocarditis prophylaxis  You will have to take antibiotics for the rest of your life before all dental procedures (even teeth cleanings) to protect your heart valve. Antibiotics are also required before some surgeries. Please check with your cardiologist before scheduling any surgeries. Also, please make sure to tell us if you have a penicillin allergy as you will require an alternative antibiotic.

## 2018-11-26 NOTE — Progress Notes (Signed)
Pt ambulated 500 feet with front-wheel walker. Stopped twice. Bilateral groin site level zero. Gauze removed from left groin, and band aid applied. Bilateral DP pulses 2+. Will continue to monitor.

## 2018-11-26 NOTE — Progress Notes (Signed)
1 Day Post-Op Procedure(s) (LRB): TRANSCATHETER AORTIC VALVE REPLACEMENT, TRANSFEMORAL (N/A) TRANSESOPHAGEAL ECHOCARDIOGRAM (TEE) (N/A) Subjective:  No chest pain or shortness of breath.  Concerned about hypertension overnight. NTG drip started.   Objective: Vital signs in last 24 hours: Temp:  [97.2 F (36.2 C)-98.2 F (36.8 C)] 98.2 F (36.8 C) (07/08 0801) Pulse Rate:  [0-251] 68 (07/08 0801) Cardiac Rhythm: Atrial fibrillation (07/08 0700) Resp:  [12-20] 14 (07/08 0801) BP: (119-189)/(62-112) 128/84 (07/08 0801) SpO2:  [94 %-99 %] 94 % (07/08 0801) Weight:  [83.9 kg-85 kg] 85 kg (07/08 0500)  Hemodynamic parameters for last 24 hours:    Intake/Output from previous day: 07/07 0701 - 07/08 0700 In: 2575 [P.O.:500; I.V.:1775; IV Piggyback:300.1] Out: 2400 [Urine:2100; Blood:300] Intake/Output this shift: No intake/output data recorded.  General appearance: alert and cooperative Neurologic: intact Heart: irregularly irregular rhythm, no murmur Lungs: clear to auscultation bilaterally Extremities: extremities normal, atraumatic, no cyanosis or edema Wound: groin sites look fine.  Lab Results: Recent Labs    11/25/18 1337 11/26/18 0435  WBC  --  5.9  HGB 12.2* 12.7*  HCT 36.0* 37.9*  PLT  --  116*   BMET:  Recent Labs    11/25/18 1337 11/26/18 0435  NA 142 137  K 4.4 3.9  CL 105 106  CO2  --  22  GLUCOSE 139* 93  BUN 25* 19  CREATININE 0.90 1.05  CALCIUM  --  8.7*    PT/INR:  Recent Labs    11/25/18 0927  LABPROT 14.5  INR 1.1   ABG    Component Value Date/Time   PHART 7.479 (H) 11/20/2018 0931   HCO3 24.1 11/20/2018 0931   TCO2 27 11/25/2018 1337   ACIDBASEDEF 1.0 10/29/2018 1553   O2SAT 99.3 11/20/2018 0931   CBG (last 3)  No results for input(s): GLUCAP in the last 72 hours.   ECG: atrial fib 65/min. No high grade block.  Assessment/Plan: S/P Procedure(s) (LRB): TRANSCATHETER AORTIC VALVE REPLACEMENT, TRANSFEMORAL  (N/A) TRANSESOPHAGEAL ECHOCARDIOGRAM (TEE) (N/A)  POD 1 Hemodynamically stable this am. Still on NTG 3 cc so will stop that and resume his low dose Lopressor that he has been on for HTN. His HR has been 65-80 so will need to be sure that Lopressor does not drop it too much.  2D echo pending.  Continue mobilization and plan home later today if HR and BP stable.  Plan to resume Eliquis and baby ASA at home for chronic atrial fib and valve.   LOS: 1 day    [name] 11/26/2018

## 2018-11-26 NOTE — Discharge Summary (Signed)
[place] VALVE TEAM  Discharge Summary    Patient ID: Eric [name] MRN: [id number]; DOB: 12/06/1929  Admit date: 11/25/2018 Discharge date: 11/26/2018  Primary Care Provider: [name], MD  Primary Cardiologist: [name], MD / Dr. [name] & Dr. 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 Active Problems:   Embolism and thrombosis [facility])   Coronary atherosclerosis of native coronary artery   Hyperlipidemia   Acute on chronic diastolic heart failure ([place])   History of aortic valve replacement   Atrial fibrillation ([place])   Prosthetic valve dysfunction   History of bladder cancer   History of pulmonary embolus (PE)   Ectatic abdominal aorta (HCC)   History of mitral valve repair   Allergies No Known Allergies  Diagnostic Studies/Procedures    TAVR OPERATIVE NOTE   Date of Procedure:                11/25/2018  Preoperative Diagnosis:      Severe Aortic Stenosis   Postoperative Diagnosis:    Same   Procedure:        Valve in ValveTranscatheter Aortic Valve Replacement - Percutaneous right Transfemoral Approach             Edwards Sapien 3 THV (size 23 mm, model # 9600TFX, serial # [id number])              Co-Surgeons:                        [name], MD and [name], MD   Anesthesiologist:                  Tamela Gammon, MD  Echocardiographer:              Ena Dawley, MD.   Pre-operative Echo Findings: ? Severe prosthetic aortic valve insulfficiency ? Normal left ventricular systolic function  Post-operative Echo Findings: ? Residual focal paravalvular leak around old bioprosthetic valve ? Normal left ventricular systolic function  _____________   Echo 11/26/18: pending at the time of discharge   History of Present Illness     Eric [name] is a 85 y.o. 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  Patient has a history of several different cancers as well as recurrent DVT/PE. He was diagnosed with severe AS and severe MR in 2008. [facility] 05/17/2007 showed 30% eccentric narrowing of the ostium of the LAD, 80% tubular proximal stenosis of a large marginal branch. He underwent AVR with a 15mm Mitroflow pericardial valve, mitral valve repair w/ ring annuloplasty and single vessel bypass with SVG--> OM1 in 2009 by Dr. [name].   He was been followed by Dr Irish Lack and done very well. In 2015, he went into atrial flutter with RVR after dental extraction with profuse bleeding due to [facility]. He underwent successful cardioversion. He had been on Eliquis for recurrent DVT/PE.   He was in his usual state of health until he was admitted to an outside hospital in [place], [place] in 03/2018. Per Dr. Hassell Done report he was admitted overnight for heart failure and afib. Echo 04/17/18 showed  EF 55%, normal RV size/function, RVSP 40 mm Hg, s/p AVR with moderate central AI, s/p mitral repair with normal function.   Since that time he has been back on lasix and felt more out of breath. Lab work done by [employer] in 07/2018 revealed concern for low grade hemolysis. TEE was considered but the patient was in [place], and given the COVID outbreak, the procedure was postponed.   TEE was finally completed on 10/22/18 and showed EF 50-55%, normally functioning mitral valve repair, mod-severe TR, severe AI. AoV mean gradient 37mm Hg, peak gradient 27 mm Hg. Dr. Irish Lack referred him to Dr. [name] for structural heart consults and consideration of TAVR. L/RHC on 10/29/18 showed single-vessel coronary disease with a severe stenosis of the proximal left  circumflex and continued patency of the saphenous vein graft to the first OM. His coronaries were patent otherwise.   The patient has been evaluated by the multidisciplinary valve team and felt to have severe, symptomatic aortic stenosis and to be a suitable candidate for TAVR, which was set up for 11/25/18.   Hospital Course     Consultants: none   Prosthetic valve dysfunction with severe AI: s/p successful valve in valve TAVR with a 23 mm Edwards Sapien 3 THV via the TF approach on 11/25/18. Post operative echo pending. Groin sites are stable. ECG with atrial fibrillation and no high grade heart block. Continue Asprin 81 mg x 6 months and resume home Eliquis.   HTN: BP was elevated after TAVR requiring a NTG gtt. This has been discontinued. He has been resumed on home Lopressor 12.5mg  BID and Losartan 25mg  daily added to regimen. He will resume home lasix at a lower dose (40mg  --> 20mg  daily). Will follow renal function and electrolytes as an outpatient.   Acute on chronic diastolic CHF: as evidenced by an elevated BNP on pre admission lab work. This has been treated with TAVR. Plan to resume home lasix but at a lower dose  CAD s/p CABG: pre TAVR cath showed patent SVG--> OM. Continue medical therapy.   Recurrent DVT/PE: continue chronic anticoagulation   Persistent afib: rate well controlled. Resume home Eliquis  Incidental findings: noted during pre TAVR work up  Elevated Bilirubin: will repeat CMET next week and defer to PCP.  Nonspecific basilar predominant findings that may indicate an interstitial lung disease, without significant bronchiectasis or honeycombing. Pulmonology consultation and follow-up high-resolution chest CT could be obtained in 3-6 months as clinically warranted. Will discuss as outpatient  Ectatic 2.8 cm infrarenal abdominal aorta, at risk for aneurysm development. Recommend follow-up aortic ultrasound in 5 years. Will discuss as  outpatient  _____________  Discharge Vitals Blood pressure (!) 155/92, pulse 68, temperature 98.2 F (36.8 C), temperature source Oral, resp. rate (!) 23, height 6' (1.829 m), weight 85 kg, SpO2 94 %.  Filed Weights   11/25/18 0849 11/26/18 0500  Weight: 83.9 kg 85 kg   GEN: Well nourished, well developed, in no acute distress. Appears younger than his stated age 63: normal Neck: no JVD or masses Cardiac: irreg irreg; 2/6 SEM @ RUSB. No rubs, or gallops,no edema  Respiratory:  clear to auscultation bilaterally, normal work of breathing GI: soft, nontender, nondistended, + BS MS: no deformity or atrophy Skin: warm and dry, no rash Neuro:  Alert and Oriented x 3, Strength and sensation are intact Psych: euthymic mood, full affect    Labs & Radiologic Studies    CBC Recent Labs    11/25/18 1337 11/26/18 0435  WBC  --  5.9  HGB 12.2* 12.7*  HCT 36.0* 37.9*  MCV  --  92.4  PLT  --  314*   Basic Metabolic Panel Recent Labs    11/25/18 1337 11/26/18 0435  NA 142 137  K 4.4 3.9  CL 105 106  CO2  --  22  GLUCOSE 139* 93  BUN 25* 19  CREATININE 0.90 1.05  CALCIUM  --  8.7*  MG  --  1.9   Liver Function Tests No results for input(s): AST, ALT, ALKPHOS, BILITOT, PROT, ALBUMIN in the last 72 hours. No results for input(s): LIPASE, AMYLASE in the last 72 hours. Cardiac Enzymes No results for input(s): CKTOTAL, CKMB, CKMBINDEX, TROPONINI in the last 72 hours. BNP Invalid input(s): POCBNP D-Dimer No results for input(s): DDIMER in the last 72 hours. Hemoglobin A1C No results for input(s): HGBA1C in the last 72 hours. Fasting Lipid Panel No results for input(s): CHOL, HDL, LDLCALC, TRIG, CHOLHDL, LDLDIRECT in the last 72 hours. Thyroid Function Tests No results for input(s): TSH, T4TOTAL, T3FREE, THYROIDAB in the last 72 hours.  Invalid input(s): FREET3 _____________  Dg Chest 2 View  Result Date: 11/20/2018 CLINICAL DATA:  Preop TAVR.  Coronary artery  disease. EXAM: CHEST - 2 VIEW COMPARISON:  CT dated November 03, 2018 FINDINGS: The patient is status post prior median sternotomy and multi valve replacement. Aortic calcifications are noted. There is no pneumothorax. There is some atelectasis versus scarring at the lung bases bilaterally. The heart size is mildly enlarged. Aortic calcifications are noted. Degenerative changes are noted of the shoulders. IMPRESSION: No active cardiopulmonary disease. Electronically Signed   By: Constance Holster M.D.   On: 11/20/2018 13:40   Ct Coronary Morph W/cta Cor W/score W/ca W/cm &/or Wo/cm  Addendum Date: 11/03/2018   ADDENDUM REPORT: 11/03/2018 17:29 EXAM: OVER-READ INTERPRETATION  CT CHEST The following report is an over-read performed by radiologist Dr. Samara Snide [facility] [place] on 11/03/2018. This over-read does not include interpretation of cardiac or coronary anatomy or pathology. The cardiac CTA interpretation by the cardiologist is attached. COMPARISON:  11/11/2006 chest CT angiogram. FINDINGS: Please see the separate concurrent chest CT angiogram report for details. IMPRESSION: Please see the separate concurrent chest CT angiogram report for details. Electronically Signed   By: Ilona Sorrel M.D.   On: 11/03/2018 17:29   Result Date: 11/03/2018 CLINICAL DATA:  AVR Pre Valve in Valve TAVR EXAM: Cardiac TAVR CT TECHNIQUE: The patient was scanned on a Siemens Force 970 slice scanner. A 120 kV retrospective scan was triggered in the ascending thoracic aorta at 140 HU's. Gantry rotation speed was 250 msecs and collimation was .6 mm. No beta blockade or nitro were given. The 3D data set was reconstructed in 5% intervals of the R-R cycle. Systolic and diastolic phases were analyzed on a dedicated work station using MPR, MIP and VRT modes. The patient received 80 cc of contrast. FINDINGS: Aortic Valve: There is a 23 mm Mitroflow stented pericardial bioprosthetic in place. Sewing ring appears intact with no  areas of dehiscence The leaflets are thickened and there appears to be prolapse of the right coronary cusp. Aorta: Moderate calcific atherosclerosis No aneurysm. Normal arch vessels Sino-tubular Junction: 29 mm Ascending Thoracic Aorta: 36 mm Aortic Arch: 29 mm Descending Thoracic Aorta: 24 mm Sinus of Valsalva Measurements: Non-coronary: 29.4 mm Right - coronary: 28 mm Left -   coronary: 30.6 mm Coronary Artery Height above Annulus: Left Main: 10.6 mm above annulus Right  Coronary: 10.9 mm above annulus Valve ID: 19.2 mm Area inside sewing ring [id number] There is a patent SVG to the OM branch. The patient has had a mitral valve repair with annuloplasty ring. The LAA was supposed to have been ligated but appears patent with no obvious thrombus IMPRESSION: 1. 23 mm Mitroflow stented pericardial tissue valve with thickened leaflets and possible prolapse of right cusp. 2. On SA basal view LM is 7.3 mm from valve leaflet border and RCA is 7.4 mm away However there is concern for the coronary heights Even a 20 mm Sapein 3 is 14 mm in height The RCA appears to rest behind the right leaflet with STJ calcium and may be at higher risk of occlusion 3.  Normal aortic root 3.6 cm 4.  Post mitral valve repair with annuloplasty ring 5.  LAA is patent Surgical note indicates ligation 6.  Patent SVG to OM Jenkins Rouge Electronically Signed: By: Jenkins Rouge M.D. On: 11/03/2018 15:16   Dg Chest Port 1 View  Result Date: 11/25/2018 CLINICAL DATA:  Status post TAVR. EXAM: PORTABLE CHEST 1 VIEW COMPARISON:  November 20, 2018 FINDINGS: The patient is status post TAVR. The heart, hila, mediastinum, and pleura are unremarkable. There is a small left effusion with underlying atelectasis. No other changes. IMPRESSION: There is a small left effusion with underlying atelectasis. No other abnormalities. Electronically Signed   By: Dorise Bullion III M.D   On: 11/25/2018 16:31   Ct Angio Chest Aorta W &/or Wo Contrast  Result Date:  11/03/2018 CLINICAL DATA:  85 year old male with severe symptomatic aortic stenosis. Pre-TAVR evaluation. EXAM: CT ANGIOGRAPHY CHEST, ABDOMEN AND PELVIS TECHNIQUE: Multidetector CT imaging through the chest, abdomen and pelvis was performed using the standard protocol during bolus administration of intravenous contrast. Multiplanar reconstructed images and MIPs were obtained and reviewed to evaluate the vascular anatomy. CONTRAST:  167mL OMNIPAQUE IOHEXOL 350 MG/ML SOLN COMPARISON:  04/23/2018 chest radiograph. 01/17/2016 CT abdomen/pelvis. 11/11/2006 chest CT angiogram. FINDINGS: CTA CHEST FINDINGS Cardiovascular: Mild cardiomegaly. Thickened aortic valve with aortic and mitral annuloplasty rings in place. Three-vessel coronary atherosclerosis status post CABG with ascending aortic bypass graft to a circumflex branch. No significant pericardial effusion/thickening. Atherosclerotic nonaneurysmal thoracic aorta noting mild atherosclerotic calcification in the ascending thoracic aorta. Top-normal caliber main pulmonary artery (3.2 cm diameter). No central pulmonary emboli. Mediastinum/Nodes: Heterogeneously enhancing 3.1 cm right thyroid lobe nodule, not substantially changed in size since 2008 CT. Unremarkable esophagus. No pathologically enlarged axillary, mediastinal or hilar lymph nodes. Lungs/Pleura: No pneumothorax. No pleural effusion. No acute consolidative airspace disease, lung masses or significant pulmonary nodules. Patchy mild subpleural reticulation and ground-glass attenuation throughout both lungs with a basilar predominance, without significant regions traction bronchiectasis or frank honeycombing, for which comparison to 2008 chest CT is limited by the presence of pleural effusions and atelectasis on the prior scan. Musculoskeletal: No aggressive appearing focal osseous lesions. Intact sternotomy wires. Moderate thoracic spondylosis. CTA ABDOMEN AND PELVIS FINDINGS Hepatobiliary: Normal liver with no  liver mass. Normal gallbladder with no radiopaque cholelithiasis. No biliary ductal dilatation. Pancreas: Normal, with no mass or duct dilation. Spleen: Normal size. No mass. Adrenals/Urinary Tract: Normal adrenals. Normal kidneys with no hydronephrosis and no contour deforming renal mass. Nonspecific eccentric left bladder wall thickening. No significant bladder distention. Stomach/Bowel: Normal non-distended stomach. Normal caliber small bowel with no small bowel wall thickening. Normal appendix. Moderate sigmoid diverticulosis, with no large bowel wall thickening or significant pericolonic stranding. Vascular/Lymphatic: Abdominal aorta with ectatic 2.8  cm infrarenal abdominal aorta. Patent renal and splenic veins. No pathologically enlarged lymph nodes in the abdomen or pelvis. Reproductive: Status post prostatectomy. Other: No pneumoperitoneum, ascites or focal fluid collection. Musculoskeletal: No aggressive appearing focal osseous lesions. Moderate lumbar spondylosis VASCULAR MEASUREMENTS PERTINENT TO TAVR: AORTA: Minimal Aortic Diameter-15.9 x 14.5 mm Severity of Aortic Calcification-severe RIGHT PELVIS: Right Common Iliac Artery - Minimal Diameter-10.8 x 10.5 mm Tortuosity-moderate to severe Calcification-mild Right External Iliac Artery - Minimal Diameter-9.1 x 8.8 mm Tortuosity-severe Calcification-minimal Right Common Femoral Artery - Minimal Diameter-9.7 x 6.1 mm Tortuosity-mild Calcification-severe LEFT PELVIS: Left Common Iliac Artery - Minimal Diameter-14.1 x 8.6 mm Tortuosity-severe Calcification-mild-to-moderate Left External Iliac Artery - Minimal Diameter-9.1 x 8.9 mm Tortuosity-mild-to-moderate Calcification-minimal Left Common Femoral Artery - Minimal Diameter-8.9 x 7.3 mm Tortuosity-mild Calcification-moderate Review of the MIP images confirms the above findings. IMPRESSION: 1. Vascular findings and measurements pertinent to potential TAVR procedure, as detailed. 2. Thickening of the aortic  valve, compatible with the reported history of severe aortic stenosis. Aortic and mitral annuloplasty rings in place. 3. Mild cardiomegaly. 4. Ectatic 2.8 cm infrarenal abdominal aorta, at risk for aneurysm development. Recommend follow-up aortic ultrasound in 5 years. This recommendation follows ACR consensus guidelines: White Paper of the ACR Incidental Findings Committee II on Vascular Findings. J Am Coll Radiol 2013; 10:789-794. 5. Right thyroid lobe 3.1 cm nodule, not substantially changed in size since 2008 chest CT. 6. Nonspecific basilar predominant findings that may indicate an interstitial lung disease, without significant bronchiectasis or honeycombing. Pulmonology consultation and follow-up high-resolution chest CT could be obtained in 3-6 months as clinically warranted. 7. Nonspecific eccentric left bladder wall thickening. 8. Moderate sigmoid diverticulosis. 9.  Aortic Atherosclerosis (ICD10-I70.0). Electronically Signed   By: Ilona Sorrel M.D.   On: 11/03/2018 17:28   Vas US Carotid  Result Date: 11/20/2018 Carotid Arterial Duplex Study Indications:       Pre-TAVR. Comparison Study:  No prior studies. Performing Technologist: Oliver Hum RVT  Examination Guidelines: A complete evaluation includes B-mode imaging, spectral Doppler, color Doppler, and power Doppler as needed of all accessible portions of each vessel. Bilateral testing is considered an integral part of a complete examination. Limited examinations for reoccurring indications may be performed as noted.  Right Carotid Findings: +----------+--------+-------+--------+--------------------------------+--------+           PSV cm/sEDV    StenosisDescribe                        Comments                   cm/s                                                    +----------+--------+-------+--------+--------------------------------+--------+ CCA Prox  83      2              smooth and heterogenous         tortuous  +----------+--------+-------+--------+--------------------------------+--------+ CCA Distal87      1              smooth, heterogenous and  calcific                                 +----------+--------+-------+--------+--------------------------------+--------+ ICA Prox  73      10             smooth, heterogenous and                                                  calcific                                 +----------+--------+-------+--------+--------------------------------+--------+ ICA Distal61      6                                                       +----------+--------+-------+--------+--------------------------------+--------+ ECA       76      0                                                       +----------+--------+-------+--------+--------------------------------+--------+ +----------+--------+-------+--------+-------------------+           PSV cm/sEDV cmsDescribeArm Pressure (mmHG) +----------+--------+-------+--------+-------------------+ Subclavian114                                        +----------+--------+-------+--------+-------------------+ +---------+--------+--+--------+-+---------+ VertebralPSV cm/s30EDV cm/s4Antegrade +---------+--------+--+--------+-+---------+  Left Carotid Findings: +----------+--------+--------+--------+-----------------------+--------+           PSV cm/sEDV cm/sStenosisDescribe               Comments +----------+--------+--------+--------+-----------------------+--------+ CCA Prox  92      1               smooth and heterogenous         +----------+--------+--------+--------+-----------------------+--------+ CCA Distal88      1               smooth and heterogenous         +----------+--------+--------+--------+-----------------------+--------+ ICA Prox  46      9               smooth and heterogenous          +----------+--------+--------+--------+-----------------------+--------+ ICA Distal154     7                                      tortuous +----------+--------+--------+--------+-----------------------+--------+ ECA       74      0                                               +----------+--------+--------+--------+-----------------------+--------+ +----------+--------+--------+--------+-------------------+ SubclavianPSV cm/sEDV cm/sDescribeArm Pressure (mmHG) +----------+--------+--------+--------+-------------------+  156                                         +----------+--------+--------+--------+-------------------+ +---------+--------+--+--------+---------+ VertebralPSV cm/s53EDV cm/sAntegrade +---------+--------+--+--------+---------+  Summary: Right Carotid: Velocities in the right ICA are consistent with a 1-39% stenosis. Left Carotid: Velocities in the left ICA are consistent with a 1-39% stenosis. Vertebrals: Bilateral vertebral arteries demonstrate antegrade flow. *See table(s) above for measurements and observations.  Electronically signed by Monica Martinez MD on 11/20/2018 at 2:26:41 PM.    Final    Ct Angio Abd/pel W/ And/or W/o  Result Date: 11/03/2018 CLINICAL DATA:  85 year old male with severe symptomatic aortic stenosis. Pre-TAVR evaluation. EXAM: CT ANGIOGRAPHY CHEST, ABDOMEN AND PELVIS TECHNIQUE: Multidetector CT imaging through the chest, abdomen and pelvis was performed using the standard protocol during bolus administration of intravenous contrast. Multiplanar reconstructed images and MIPs were obtained and reviewed to evaluate the vascular anatomy. CONTRAST:  161mL OMNIPAQUE IOHEXOL 350 MG/ML SOLN COMPARISON:  04/23/2018 chest radiograph. 01/17/2016 CT abdomen/pelvis. 11/11/2006 chest CT angiogram. FINDINGS: CTA CHEST FINDINGS Cardiovascular: Mild cardiomegaly. Thickened aortic valve with aortic and mitral annuloplasty rings in place.  Three-vessel coronary atherosclerosis status post CABG with ascending aortic bypass graft to a circumflex branch. No significant pericardial effusion/thickening. Atherosclerotic nonaneurysmal thoracic aorta noting mild atherosclerotic calcification in the ascending thoracic aorta. Top-normal caliber main pulmonary artery (3.2 cm diameter). No central pulmonary emboli. Mediastinum/Nodes: Heterogeneously enhancing 3.1 cm right thyroid lobe nodule, not substantially changed in size since 2008 CT. Unremarkable esophagus. No pathologically enlarged axillary, mediastinal or hilar lymph nodes. Lungs/Pleura: No pneumothorax. No pleural effusion. No acute consolidative airspace disease, lung masses or significant pulmonary nodules. Patchy mild subpleural reticulation and ground-glass attenuation throughout both lungs with a basilar predominance, without significant regions traction bronchiectasis or frank honeycombing, for which comparison to 2008 chest CT is limited by the presence of pleural effusions and atelectasis on the prior scan. Musculoskeletal: No aggressive appearing focal osseous lesions. Intact sternotomy wires. Moderate thoracic spondylosis. CTA ABDOMEN AND PELVIS FINDINGS Hepatobiliary: Normal liver with no liver mass. Normal gallbladder with no radiopaque cholelithiasis. No biliary ductal dilatation. Pancreas: Normal, with no mass or duct dilation. Spleen: Normal size. No mass. Adrenals/Urinary Tract: Normal adrenals. Normal kidneys with no hydronephrosis and no contour deforming renal mass. Nonspecific eccentric left bladder wall thickening. No significant bladder distention. Stomach/Bowel: Normal non-distended stomach. Normal caliber small bowel with no small bowel wall thickening. Normal appendix. Moderate sigmoid diverticulosis, with no large bowel wall thickening or significant pericolonic stranding. Vascular/Lymphatic: Abdominal aorta with ectatic 2.8 cm infrarenal abdominal aorta. Patent renal and  splenic veins. No pathologically enlarged lymph nodes in the abdomen or pelvis. Reproductive: Status post prostatectomy. Other: No pneumoperitoneum, ascites or focal fluid collection. Musculoskeletal: No aggressive appearing focal osseous lesions. Moderate lumbar spondylosis VASCULAR MEASUREMENTS PERTINENT TO TAVR: AORTA: Minimal Aortic Diameter-15.9 x 14.5 mm Severity of Aortic Calcification-severe RIGHT PELVIS: Right Common Iliac Artery - Minimal Diameter-10.8 x 10.5 mm Tortuosity-moderate to severe Calcification-mild Right External Iliac Artery - Minimal Diameter-9.1 x 8.8 mm Tortuosity-severe Calcification-minimal Right Common Femoral Artery - Minimal Diameter-9.7 x 6.1 mm Tortuosity-mild Calcification-severe LEFT PELVIS: Left Common Iliac Artery - Minimal Diameter-14.1 x 8.6 mm Tortuosity-severe Calcification-mild-to-moderate Left External Iliac Artery - Minimal Diameter-9.1 x 8.9 mm Tortuosity-mild-to-moderate Calcification-minimal Left Common Femoral Artery - Minimal Diameter-8.9 x 7.3 mm Tortuosity-mild Calcification-moderate Review of the MIP images confirms the above findings. IMPRESSION: 1. Vascular  findings and measurements pertinent to potential TAVR procedure, as detailed. 2. Thickening of the aortic valve, compatible with the reported history of severe aortic stenosis. Aortic and mitral annuloplasty rings in place. 3. Mild cardiomegaly. 4. Ectatic 2.8 cm infrarenal abdominal aorta, at risk for aneurysm development. Recommend follow-up aortic ultrasound in 5 years. This recommendation follows ACR consensus guidelines: White Paper of the ACR Incidental Findings Committee II on Vascular Findings. J Am Coll Radiol 2013; 10:789-794. 5. Right thyroid lobe 3.1 cm nodule, not substantially changed in size since 2008 chest CT. 6. Nonspecific basilar predominant findings that may indicate an interstitial lung disease, without significant bronchiectasis or honeycombing. Pulmonology consultation and follow-up  high-resolution chest CT could be obtained in 3-6 months as clinically warranted. 7. Nonspecific eccentric left bladder wall thickening. 8. Moderate sigmoid diverticulosis. 9.  Aortic Atherosclerosis (ICD10-I70.0). Electronically Signed   By: Ilona Sorrel M.D.   On: 11/03/2018 17:28   Disposition   Pt is being discharged home today in good condition.  Follow-up Plans & Appointments    Follow-up Information    Eileen Stanford, PA-C. Go on 12/03/2018.   Specialties: Cardiology, Radiology Why: @ 1pm for an in office visit. Please arrive at least 15 minutes early. Contact information: [place] [phone]          Discharge Instructions    Amb Referral to Cardiac Rehabilitation   Complete by: As directed    Diagnosis: Valve Replacement   Valve: Aortic Comment - TAVR   After initial evaluation and assessments completed: Virtual Based Care may be provided alone or in conjunction with Phase 2 Cardiac Rehab based on patient barriers.: No      Discharge Medications   Allergies as of 11/26/2018   No Known Allergies     Medication List    STOP taking these medications   ciprofloxacin 500 MG tablet Commonly known as: Cipro     TAKE these medications   amoxicillin 500 MG tablet Commonly known as: AMOXIL Take 4 tablets (2,000 mg total) by mouth as directed. Take 1 hour prior to dental work, including cleanings.   aspirin 81 MG chewable tablet Chew 1 tablet (81 mg total) by mouth daily. Start taking on: November 27, 2018   atorvastatin 40 MG tablet Commonly known as: LIPITOR TAKE 1 TABLET EVERY EVENING   Eliquis 5 MG Tabs tablet Generic drug: apixaban TAKE 1 TABLET TWICE A DAY   ezetimibe 10 MG tablet Commonly known as: ZETIA Take 1 tablet (10 mg total) by mouth every evening.   furosemide 20 MG tablet Commonly known as: LASIX Take 1 tablet (20 mg total) by mouth daily. Pt may take additional doses if needed for leg  swelling What changed: how much to take   losartan 25 MG tablet Commonly known as: COZAAR Take 1 tablet (25 mg total) by mouth daily.   metoprolol tartrate 25 MG tablet Commonly known as: LOPRESSOR Take 0.5 tablets (12.5 mg total) by mouth 2 (two) times daily.         Outstanding Labs/Studies   CMET  Duration of Discharge Encounter   Greater than 30 minutes including physician time.  Mable Fill, PA-C 11/26/2018, 11:43 AM [phone]

## 2018-11-26 NOTE — Progress Notes (Signed)
Text paged on call cardiology DR. [place] and notified pt had 2.49 sec pause and HR of 39 that didn't sustain. Pt resting in bed. No s/s of distress. Will continue to monitor.

## 2018-11-27 ENCOUNTER — Telehealth: Payer: Self-pay | Admitting: Physician Assistant

## 2018-11-27 ENCOUNTER — Telehealth (HOSPITAL_COMMUNITY): Payer: Self-pay

## 2018-11-27 ENCOUNTER — Encounter: Payer: Self-pay | Admitting: Thoracic Surgery (Cardiothoracic Vascular Surgery)

## 2018-11-27 NOTE — Telephone Encounter (Signed)
Pt insurance is active and benefits verified through [facility]. Co-pay $10.00, DED $0.00/$0.00 met, out of pocket $925.00/$307.74 met, co-insurance 0%. No pre-authorization required. Passport, 11/27/2018 @ 10:08AM, [id number]  Will contact patient to see if he is interested in the Cardiac Rehab Program. If interested, patient will need to complete follow up appt. Once completed, patient will be contacted for scheduling upon review by the RN Navigator.

## 2018-11-27 NOTE — Telephone Encounter (Signed)
  [facility] VALVE TEAM   Patient contacted regarding discharge from [facility] on 11/26/18  Patient understands to follow up with provider Nell Range on 7/15 @ 1pm at [facility].  Patient understands discharge instructions? yes Patient understands medications and regimen? yes Patient understands to bring all medications to this visit? Yes  Patient was started on Losartan 25mg  daily yesterday for HTN. His BP has been labile since getting home with a few numbers in  190/100s. I asked them to increase dose of Losartan to 50mg  if it remains in that high range over the weekend. I will see him back next week for close follow up and labs.   Angelena Form PA-C  [facility]

## 2018-11-27 NOTE — Telephone Encounter (Signed)
Called patient to see if he is interested in the Cardiac Rehab Program. Patient expressed interest. Explained scheduling process and went over insurance, patient verbalized understanding. Will contact patient for scheduling once f/u has been completed.  °

## 2018-11-28 MED FILL — Magnesium Sulfate Inj 50%: INTRAMUSCULAR | Qty: 10 | Status: AC

## 2018-11-28 MED FILL — Heparin Sodium (Porcine) Inj 1000 Unit/ML: INTRAMUSCULAR | Qty: 30 | Status: AC

## 2018-11-28 MED FILL — Potassium Chloride Inj 2 mEq/ML: INTRAVENOUS | Qty: 40 | Status: AC

## 2018-12-01 ENCOUNTER — Telehealth: Payer: Self-pay | Admitting: Physician Assistant

## 2018-12-01 NOTE — Telephone Encounter (Signed)
  [facility]   MULTIDISCIPLINARY HEART VALVE TEAM   Patient and wife called about severe constipation. He has passed some stool but has been taking miralax and senna everyday. Now stool is mushy and they want to know what to do. I have asked him to hold off on miralax a few days. I encouraged walking to mobilize bowels. If he doesn't have a BM in the next two days he can try a glycerin suppository or enema.    Angelena Form PA-C  [facility]

## 2018-12-01 NOTE — Progress Notes (Signed)
HEART [name]                                       Cardiology Office Note    Date:  12/03/2018   ID:  Eric [name], DOB [birth year], MRN [id number]  PCP:  [name], MD  Cardiologist: [name], MD / Dr. [name] & Dr. [name] (TAVR)  CC: [facility] s/p TAVR  History of Present Illness:  Eric [name] is a 85 y.o. 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   TEE on 10/22/18 showed EF 50-55%, normally functioning mitral valve repair, mod-severe TR, severe AI. AoV mean gradient 18m Hg, peak gradient 27 mm Hg. Dr. VIrish Lackreferred him to Dr. CBurt Knackfor structural heart consult and consideration of TAVR. L/RHC on 10/29/18 showed single-vessel coronary disease with a severe stenosis of the proximal left circumflex and continued patency of the saphenous vein graft to the first OM. His coronaries were patent otherwise.   He was evaluated by the multidisciplinary valve team and underwent successful valve in valve TAVR with a 23 mm Edwards Sapien 3 THV via the TF approach on 11/25/18. Post operative echo showed EF 50-55%, severe MAC with mild MS, mild-mod TR and normally functioning TAVR with mean gradient 10.518mg and no PVL. His BP was noted to be elevated post operatively and he was started on Losartan 2550maily. He was discharged on home Eliquis and aspirin. Lasix decreased from 12m20mily to 20mg26mly.   ToadyAssunta Curtisresents to clinic for follow up. His wife is with him.  No CP or SOB. No LE edema, orthopnea or PND. No dizziness or syncope. No blood in stool or urine. No palpitations. He is doing much better since having a BM with  an enema.    Past Medical History:  Diagnosis Date   Atrial flutter, paroxysmal (HCC) [place]. dx 04-02-2014, s/p  successful cardioversion 04-06-2014.   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 10/2010 Ex MV: EF 72%, inf attenuation w/o ischemia, brief run of PAT with exercise. To  Thrombocytopenia ([place])    Thyroid goiter 2013   nodular   Wears hearing aid    BILATERAL   Wears partial dentures    UPPER    Past Surgical History:  Procedure Laterality Date   AORTIC VALVE REPLACEMENT (AVR)/CORONARY ARTERY BYPASS GRAFTING (CABG)  06-19-2007  dr [name]   SVG to OM1 ;   AVR w/ #23 Mitralflow pericardial and ligation left atrial appendage;  MV repair w/ #28 Sorin 3-D memo Ring Annuloplasty with repair ruptured chordar of P-2 flail segments   CARDIAC CATHETERIZATION  05-12-2007  dr Leonia Reeves   single vessel 30% ostial LAD,  80% LCx;  severe to critial AS,  moderate MR,  normal LVF, ef 70%,  normal right heart pressures and cardiac outputs,  mild elevated LV end-diastolic pressure     CARDIOVASCULAR STRESS TEST  11-02-2010  dr  Irish Lack   normal nuclear study w/ no ischemia or infarct/scar/  normal LV function and wall motion , ef 72%   CARDIOVERSION N/A 04/06/2014   Procedure: CARDIOVERSION;  Surgeon: [name], MD;  Location: [facility];  Service: Cardiovascular;  Laterality: N/A;   successful   CATARACT EXTRACTION W/ INTRAOCULAR LENS  IMPLANT, BILATERAL  2012  approx   CORONARY ARTERY BYPASS GRAFT     RETROPUBIC RADICAL PROSTATECTOMY  06/ 2000  in [place], [place]   RIGHT/LEFT HEART CATH AND CORONARY/GRAFT ANGIOGRAPHY N/A 10/29/2018   Procedure: RIGHT/LEFT HEART CATH AND CORONARY/GRAFT ANGIOGRAPHY;  Surgeon: [name], MD;  Location: [facility];  Service: Cardiovascular;  Laterality: N/A;   ROTATOR CUFF REPAIR Left 09/1998   TEE WITHOUT CARDIOVERSION N/A 04/06/2014   Procedure: TRANSESOPHAGEAL ECHOCARDIOGRAM (TEE);  Surgeon: [name], MD;  Location[facility] ENDOSCOPY;  Service: Cardiovascular;  Laterality: N/A;  mild focal basa LVH of the septum, ef 50-55%, s/p AV annuloplasty ring, elongated chordae, thickened leaflets, mild to mod. MR, multiple small jets/arotic bioprosthetic valve sits well in position, mild AI/ thickened TV w/ mild reurg./mild LA   TEE WITHOUT CARDIOVERSION N/A 10/22/2018   Procedure: TRANSESOPHAGEAL ECHOCARDIOGRAM (TEE);  Surgeon: [name], MD;  Location: [facility] ENDOSCOPY;  Service: Cardiovascular;  Laterality: N/A;   TEE WITHOUT CARDIOVERSION N/A 11/25/2018   Procedure: TRANSESOPHAGEAL ECHOCARDIOGRAM (TEE);  Surgeon: [name], MD;  Location: [facility];  Service: Open Heart Surgery;  Laterality: N/A;   TRANSCATHETER AORTIC VALVE REPLACEMENT, TRANSFEMORAL N/A 11/25/2018   Procedure: TRANSCATHETER AORTIC VALVE REPLACEMENT, TRANSFEMORAL;  Surgeon: [name], MD;  Location: [facility];  Service: Open Heart Surgery;  Laterality: N/A;   TRANSTHORACIC ECHOCARDIOGRAM  03-23-2016   dr Irish Lack   EF 55-60%, reduced contribution atrial contraction to ventricular filling, due  to increased ventricular diastolic pressure or atrial contratile dysfunction/  bioprosthetic AV w/ mod. regurg. (valve area 1.97cm^2)/ mild dilated ascending aorta/ mild thicken MV normal function annular ring prosthesis/severe LAE & mod. to sev.RAE/ PASP 110mHg/ mild TR/ ventricle septal motion show paradox   TRANSURETHRAL RESECTION OF BLADDER TUMOR N/A 02/13/2016   Procedure: TRANSURETHRAL RESECTION OF BLADDER TUMOR (TURBT);  Surgeon: [name] MD;  [facility]  Service: Urology;  Laterality: N/A;   TRANSURETHRAL RESECTION OF BLADDER TUMOR N/A 09/04/2016   Procedure: TRANSURETHRAL RESECTION OF BLADDER TUMOR (TURBT);  Surgeon: [name] MD;  [facility]  Service: Urology;  Laterality: N/A;    Current Medications: Outpatient Medications Prior to Visit  Medication Sig Dispense Refill   amoxicillin (AMOXIL) 500 MG tablet Take 4 tablets (2,000  mg total) by mouth as directed. Take 1 hour prior to dental work, including cleanings. 12 tablet 12   aspirin 81 MG chewable tablet Chew 1 tablet (81 mg total) by mouth daily. 90 tablet 1   atorvastatin (LIPITOR) 40 MG tablet TAKE 1 TABLET EVERY EVENING 90 tablet 2   ELIQUIS 5 MG TABS tablet TAKE 1 TABLET TWICE A DAY 180 tablet 2   ezetimibe (ZETIA) 10 MG tablet Take 1 tablet (10 mg total) by mouth every evening. 90 tablet 3   furosemide (LASIX) 20 MG tablet Take 1 tablet (20 mg total) by mouth daily. Pt may take additional doses if needed for leg swelling 30 tablet 12   losartan (COZAAR) 25 MG tablet Take 1 tablet (25 mg total) by mouth daily. 30 tablet 6   metoprolol tartrate (LOPRESSOR) 25 MG tablet Take 0.5 tablets (12.5 mg total) by mouth 2 (two) times daily. 90 tablet 3   No facility-administered medications prior to visit.      Allergies:   Patient has no known allergies.   Social History   Socioeconomic History   Marital status: Married    Spouse name: Not on file     Number of children: Not on file   Years of education: Not on file   Highest education level: Not on file  Occupational History   Not on file  Social Needs   Financial resource strain: Not on file   Food insecurity    Worry: Not on file    Inability: Not on file   Transportation needs    Medical: Not on file    Non-medical: Not on file  Tobacco Use   Smoking status: Never Smoker   Smokeless tobacco: Never Used  Substance and Sexual Activity   Alcohol use: Yes    Alcohol/week: 1.0 - 2.0 standard drinks    Types: 1 - 2 Standard drinks or equivalent per week    Comment: OCC   Drug use: No   Sexual activity: Not on file  Lifestyle   Physical activity    Days per week: Not on file    Minutes per session: Not on file   Stress: Not on file  Relationships   Social connections    Talks on phone: Not on file    Gets together: Not on file    Attends religious service: Not on file    Active member of club or organization: Not on file    Attends meetings of clubs or organizations: Not on file    Relationship status: Not on file  Other Topics Concern   Not on file  Social History Narrative   Lives in [place] with wife.  Active.     Family History:  The patient's family history includes Leukemia in his mother; Supraventricular tachycardia in his sister.      ROS:   Please see the history of present illness.    ROS All other systems reviewed and are negative.   PHYSICAL EXAM:   VS:  BP 136/74    Pulse 83    Ht 6' (1.829 m)    Wt 181 lb 9.6 oz (82.4 kg)    SpO2 97%    BMI 24.63 kg/m    GEN: Well nourished, well developed, in no acute distress HEENT: normal Neck: no JVD or masses Cardiac: irreg irrreg; no murmurs, rubs, or gallops,no edema  Respiratory:  clear to auscultation bilaterally, normal work of breathing GI: soft, nontender, nondistended, + BS MS: no deformity  or atrophy Skin: warm and dry, no rash.  Groin sites clear without hematoma or ecchymosis   Neuro:  Alert and Oriented x 3, Strength and sensation are intact Psych: euthymic mood, full affect    Wt Readings from Last 3 Encounters:  12/03/18 181 lb 9.6 oz (82.4 kg)  11/26/18 187 lb 6.3 oz (85 kg)  11/20/18 188 lb 3 oz (85.4 kg)      Studies/Labs Reviewed:   EKG:  EKG is ordered today.  The ekg ordered today demonstrates afib with CVR HR 83, PVC  Recent Labs: 11/20/2018: ALT 26; B Natriuretic Peptide 380.4 11/26/2018: BUN 19; Creatinine, Ser 1.05; Hemoglobin 12.7; Magnesium 1.9; Platelets 116; Potassium 3.9; Sodium 137   Lipid Panel No results found for: CHOL, TRIG, HDL, CHOLHDL, VLDL, LDLCALC, LDLDIRECT  Additional studies/ records that were reviewed today include:  TAVR OPERATIVE NOTE   Date of Procedure:11/25/2018  Preoperative Diagnosis:Severe Aortic Stenosis   Postoperative Diagnosis:Same   Procedure:   Valve in ValveTranscatheter Aortic Valve Replacement - PercutaneousrightTransfemoral Approach Edwards Sapien 3 THV (size 4m, model # 9600TFX, serial # [id number]  Co-Surgeons:Bryan KAlveria Apley MD and MSherren Mocha MD   Anesthesiologist:Dan STobias Alexander MD  EDala Dock MD.   Pre-operative Echo Findings: ? Severeprosthetic aortic valve insulfficiency ? Normalleft ventricular systolic function  Post-operative Echo Findings: ? Residual focalparavalvular leak around old bioprosthetic valve ? Normalleft ventricular systolic function  _____________    Echo 11/26/18: IMPRESSIONS  1. The left ventricle has low normal systolic function, with an ejection fraction of 50-55%. The cavity size was normal. Moderate basal septal hypertrophy. Left ventricular diastolic function could not be evaluated secondary to atrial fibrillation.  Elevated left ventricular end-diastolic pressure.  2. The right ventricle has normal  systolic function. The cavity was normal. There is no increase in right ventricular wall thickness.  3. Left atrial size was moderately dilated.  4. Right atrial size was moderately dilated.  5. The mitral valve is degenerative. Moderate thickening of the mitral valve leaflet. Moderate calcification of the mitral valve leaflet. There is severe mitral annular calcification present. Mild mitral valve stenosis. MV Mean grad: 5.3 mmHg  6. Tricuspid valve regurgitation is mild-moderate.  7. There is mild dilatation of the ascending aorta measuring 39 mm.  8. - TAVR: S/P 231mEdwards Sapien THV which appears to be functioning normally. There is no perivalvular leak. The mean AV gradient is 10.52m53m, AVA (Vmax) 2.14cm2, dimensionless index 0.55 and AV Vmax 219 cm/s.    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 Groins sites are healing well. ECG with no HAVB. He has amoxicillin for SBE prophylaxis. Continue aspirin and eliquis.  I will see him back in 1 month for echo and follow up.   HTN: reviewed log and BPs are in good range with addition of Losartan 252m52mily. Check BMET today   Chronic diastolic CHF: no s/s CHF. LE edema has improved. Continue on lower dose of lasix 20mg54mly. BMET today  CAD s/p CABG: pre TAVR cath showed patent SVG--> OM. Continue medical therapy.   Recurrent DVT/PE: continue chronic anticoagulation   Persistent afib: rate well controlled.   Incidental findings: noted during pre TAVR work up  Elevated Bilirubin: will repeat today. If persistently elevated will defer back to PCP  Abnormal chest CT: Nonspecific basilar predominant findings that may indicate an interstitial lung disease, without significant bronchiectasis or honeycombing. Pulmonology consultation and follow-up high-resolution chest CT could be obtained in 3-6 months  as clinically warranted. I have discussed this with patient and he would like to follow up  with his PCP, Dr. Beather Arbour,   Ectatic AA: Ectatic 2.8 cm infrarenal abdominal aorta, at risk for aneurysm development. Recommend follow-up aortic ultrasound in 5 years. Will defer this to primary cardiologist.    Medication Adjustments/Labs and Tests Ordered: Current medicines are reviewed at length with the patient today.  Concerns regarding medicines are outlined above.  Medication changes, Labs and Tests ordered today are listed in the Patient Instructions below. Patient Instructions  Medication Instructions:  Your physician recommends that you continue on your current medications as directed. Please refer to the Current Medication list given to you today.  Your physician discussed the importance of taking an antibiotic prior to any dental, gastrointestinal, genitourinary procedures to prevent damage to the heart valves from infection. You were given a prescription for an antibiotic based on current SBE prophylaxis guidelines.  If you need a refill on your cardiac medications before your next appointment, please call your pharmacy.   Lab work: TODAY: CMET  If you have labs (blood work) drawn today and your tests are completely normal, you will receive your results only by:  [place] (if you have MyChart) OR  A paper copy in the mail If you have any lab test that is abnormal or we need to change your treatment, we will call you to review the results.  Testing/Procedures: Keep appointment for echocardiogram on 12/17/18 at 10:30 AM  Follow-Up:  Keep follow up VIRTUAL Visit with Nell Range, PA on 12/18/18 at 2:30 PM  Any Other Special Instructions Will Be Listed Below (If Applicable).     Signed, Angelena Form, PA-C  12/03/2018 1:31 PM    [facility] [address], [place], [place]  [address] Phone: [phone]; Fax: [phone]

## 2018-12-02 ENCOUNTER — Telehealth: Payer: Self-pay | Admitting: Physician Assistant

## 2018-12-02 NOTE — Telephone Encounter (Signed)
New Message        COVID-19 Pre-Screening Questions:   In the past 7 to 10 days have you had a cough,  shortness of breath, headache, congestion, fever (100 or greater) body aches, chills, sore throat, or sudden loss of taste or sense of smell? NO  Have you been around anyone with known Covid 19. NO  Have you been around anyone who is awaiting Covid 19 test results in the past 7 to 10 days? NO  Have you been around anyone who has been exposed to Covid 19, or has mentioned symptoms of Covid 19 within the past 7 to 10 days? NO Pts wife will be assisting pt and answers NO to all questions   If you have any concerns/questions about symptoms patients report during screening (either on the phone or at threshold). Contact the provider seeing the patient or DOD for further guidance.  If neither are available contact a member of the leadership team.

## 2018-12-03 ENCOUNTER — Ambulatory Visit (INDEPENDENT_AMBULATORY_CARE_PROVIDER_SITE_OTHER): Payer: Medicare Other | Admitting: Physician Assistant

## 2018-12-03 ENCOUNTER — Other Ambulatory Visit: Payer: Self-pay

## 2018-12-03 ENCOUNTER — Encounter: Payer: Self-pay | Admitting: Physician Assistant

## 2018-12-03 VITALS — BP 136/74 | HR 83 | Ht 72.0 in | Wt 181.6 lb

## 2018-12-03 DIAGNOSIS — R918 Other nonspecific abnormal finding of lung field: Secondary | ICD-10-CM

## 2018-12-03 DIAGNOSIS — I10 Essential (primary) hypertension: Secondary | ICD-10-CM | POA: Diagnosis not present

## 2018-12-03 DIAGNOSIS — I4811 Longstanding persistent atrial fibrillation: Secondary | ICD-10-CM

## 2018-12-03 DIAGNOSIS — R17 Unspecified jaundice: Secondary | ICD-10-CM

## 2018-12-03 DIAGNOSIS — Z952 Presence of prosthetic heart valve: Secondary | ICD-10-CM | POA: Diagnosis not present

## 2018-12-03 DIAGNOSIS — I5032 Chronic diastolic (congestive) heart failure: Secondary | ICD-10-CM | POA: Diagnosis not present

## 2018-12-03 DIAGNOSIS — I77811 Abdominal aortic ectasia: Secondary | ICD-10-CM

## 2018-12-03 DIAGNOSIS — Z86711 Personal history of pulmonary embolism: Secondary | ICD-10-CM

## 2018-12-03 NOTE — Patient Instructions (Addendum)
Medication Instructions:  Your physician recommends that you continue on your current medications as directed. Please refer to the Current Medication list given to you today.  Your physician discussed the importance of taking an antibiotic prior to any dental, gastrointestinal, genitourinary procedures to prevent damage to the heart valves from infection. You were given a prescription for an antibiotic based on current SBE prophylaxis guidelines.  If you need a refill on your cardiac medications before your next appointment, please call your pharmacy.   Lab work: TODAY: CMET  If you have labs (blood work) drawn today and your tests are completely normal, you will receive your results only by: [facility] MyChart Message (if you have MyChart) OR . A paper copy in the mail If you have any lab test that is abnormal or we need to change your treatment, we will call you to review the results.  Testing/Procedures: Keep appointment for echocardiogram on 12/17/18 at 10:30 AM  Follow-Up: . Keep follow up VIRTUAL Visit with Nell Range, PA on 12/18/18 at 2:30 PM  Any Other Special Instructions Will Be Listed Below (If Applicable).

## 2018-12-04 ENCOUNTER — Other Ambulatory Visit: Payer: Self-pay | Admitting: Physician Assistant

## 2018-12-04 ENCOUNTER — Other Ambulatory Visit: Payer: Self-pay | Admitting: Internal Medicine

## 2018-12-04 DIAGNOSIS — N179 Acute kidney failure, unspecified: Secondary | ICD-10-CM

## 2018-12-04 LAB — COMPREHENSIVE METABOLIC PANEL
ALT: 25 IU/L (ref 0–44)
AST: 46 IU/L — ABNORMAL HIGH (ref 0–40)
Albumin/Globulin Ratio: 1.5 (ref 1.2–2.2)
Albumin: 4.4 g/dL (ref 3.6–4.6)
Alkaline Phosphatase: 79 IU/L (ref 39–117)
BUN/Creatinine Ratio: 13 (ref 10–24)
BUN: 18 mg/dL (ref 8–27)
Bilirubin Total: 3.7 mg/dL — ABNORMAL HIGH (ref 0.0–1.2)
CO2: 23 mmol/L (ref 20–29)
Calcium: 9.5 mg/dL (ref 8.6–10.2)
Chloride: 103 mmol/L (ref 96–106)
Creatinine, Ser: 1.44 mg/dL — ABNORMAL HIGH (ref 0.76–1.27)
GFR calc Af Amer: 50 mL/min/{1.73_m2} — ABNORMAL LOW (ref >59)
GFR calc non Af Amer: 43 mL/min/{1.73_m2} — ABNORMAL LOW (ref >59)
Globulin, Total: 2.9 g/dL (ref 1.5–4.5)
Glucose: 84 mg/dL (ref 65–99)
Potassium: 4.2 mmol/L (ref 3.5–5.2)
Sodium: 142 mmol/L (ref 134–144)
Total Protein: 7.3 g/dL (ref 6.0–8.5)

## 2018-12-04 MED ORDER — FUROSEMIDE 20 MG PO TABS: 20.0000 mg | ORAL_TABLET | ORAL | 12 refills | Status: DC

## 2018-12-16 ENCOUNTER — Telehealth: Payer: Self-pay

## 2018-12-16 NOTE — Progress Notes (Signed)
HE[facility] [place]                                       Cardiology Office Note    Date:  12/17/2018   ID:  [name], DOB Oct 21, 1929, MRN [id number]  PCP:  [name], MD  Cardiologist: [name], MD/ Dr. [name] & Dr. [name] (TAVR)  CC: 1 month s/p TAVR   History of Present Illness:  Eric [name] is a 85 y.o. 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   Patient has a history of surgical AVR, mitral valve repair and CABG x1 in 2009. TEE on 10/22/18 showed EF 50-55%, normally functioning mitral valve repair, mod-severe TR, severe AI. AoV mean gradient 34m Hg, peak gradient 27 mm Hg. Dr. VIrish Lackreferred him to Dr. CBurt Knackfor structural heart consult and consideration of TAVR.L/RHC on 6/10/20showed single-vessel coronary disease with a severe stenosis of the proximal left circumflex and continued patency of the saphenous vein graft to the first OM. His coronaries were patent otherwise.  He was evaluated by the multidisciplinary valve team and underwent successfulvalve in [place] a214mEdwards Sapien 3 THV via the TF approach on 11/25/18.Post operative echo showed EF 50-55%, severe MAC with mild MS, mild-mod TR and normally functioning TAVR with mean gradient 10.58m66m and no PVL. His BP was noted to be elevated post operatively and he was started on Losartan 258m46mily. He was discharged on home Eliquis and aspirin. Lasix decreased from 40mg42mly to 20mg 62my.   Follow up labs showed increase in creat 1.05--> 1.44. Lasix decreased to 20mg e39m other day. Bilirubin was still elevated and this was sent  to his PCP for review.   Today he presents to clinic for follow up. He has started riding his recumbent bike. Has been walking up and down the stairs with resolution of his labored breathing. No CP or SOB. No LE edema, orthopnea or PND. No dizziness or syncope. No blood in stool or urine. No palpitations. Wife concerned that he is loosing too much weight.   Past Medical History:  Diagnosis Date  . Atrial flutter, paroxysmal (HCC)   [place] dx 04-02-2014, s/p  successful cardioversion 04-06-2014.  . ChronMarland Kitchenc anticoagulation    on Eliquis--  due to recurrent dvt's and pe's  . Dyslipidemia   . 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 History of DVT (deep vein thrombosis)    2000-- RLE  . History of melanoma excision    left flank;  07/ 2014 left lower leg and right upper chest  . History of prostate cancer    Gleason 6--  s/p  radical prostatectomy 06/ 2000 in [place]---  no recurrence  . History of pulmonary embolus (PE)    2000 & 2005  bilateral  . Hx of valvuloplasty 06/19/2007   a. s/p mitral ring annuloplasty, repair ruptured chordae of P2 flail segments of MV  . S/P aortic valve replacement with bioprosthetic valve    06-19-2007  severe aortic valve  stenosis  . 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 Single vessel coronary artery disease   cardiologist-  dr Irish Lack   a. 05/2007 CABG x 1: s/p SVG-OM;  b. 10/2010 Ex MV: EF 72%, inf attenuation w/o ischemia, brief run of PAT with exercise. To  . Thrombocytopenia ([place])   . Thyroid goiter 2013   nodular  . Wears hearing aid    BILATERAL  . Wears partial dentures    UPPER    Past Surgical History:  Procedure Laterality Date  . AORTIC VALVE REPLACEMENT (AVR)/CORONARY ARTERY BYPASS GRAFTING (CABG)  06-19-2007  dr [name]   SVG to OM1 ;   AVR w/ #23 Mitralflow pericardial and ligation left atrial appendage;  MV repair w/ #28 Sorin 3-D memo Ring  Annuloplasty with repair ruptured chordar of P-2 flail segments  . CARDIAC CATHETERIZATION  05-12-2007  dr Leonia Reeves   single vessel 30% ostial LAD,  80% LCx;  severe to critial AS,  moderate MR,  normal LVF, ef 70%,  normal right heart pressures and cardiac outputs,  mild elevated LV end-diastolic pressure    . CARDIOVASCULAR STRESS TEST  11-02-2010  dr Irish Lack   normal nuclear study w/ no ischemia or infarct/scar/  normal LV function and wall motion , ef 72%  . CARDIOVERSION N/A 04/06/2014   Procedure: CARDIOVERSION;  Surgeon: [name], MD;  Location: [facility];  Service: Cardiovascular;  Laterality: N/A;   successful  . CATARACT EXTRACTION W/ INTRAOCULAR LENS  IMPLANT, BILATERAL  2012  approx  . CORONARY ARTERY BYPASS GRAFT    . RETROPUBIC RADICAL PROSTATECTOMY  06/ 2000  in [place], [place]  . RIGHT/LEFT HEART CATH AND CORONARY/GRAFT ANGIOGRAPHY N/A 10/29/2018   Procedure: RIGHT/LEFT HEART CATH AND CORONARY/GRAFT ANGIOGRAPHY;  Surgeon: [name], MD;  Location: [facility];  Service: Cardiovascular;  Laterality: N/A;  . ROTATOR CUFF REPAIR Left 09/1998  . TEE WITHOUT CARDIOVERSION N/A 04/06/2014   Procedure: TRANSESOPHAGEAL ECHOCARDIOGRAM (TEE);  Surgeon: [name], MD;  Location: [facility] ENDOSCOPY;  Service: Cardiovascular;  Laterality: N/A;  mild focal basa LVH of the septum, ef 50-55%, s/p AV annuloplasty ring, elongated chordae, thickened leaflets, mild to mod. MR, multiple small jets/arotic bioprosthetic valve sits well in position, mild AI/ thickened TV w/ mild reurg./mild LA  . TEE WITHOUT CARDIOVERSION N/A 10/22/2018   Procedure: TRANSESOPHAGEAL ECHOCARDIOGRAM (TEE);  Surgeon: [name], MD;  Location[facility] ENDOSCOPY;  Service: Cardiovascular;  Laterality: N/A;  . TEE WITHOUT CARDIOVERSION N/A 11/25/2018   Procedure: TRANSESOPHAGEAL ECHOCARDIOGRAM (TEE);  Surgeon: [name], MD;  Location: [facility];  Service: Open Heart Surgery;  Laterality: N/A;  . TRANSCATHETER  AORTIC VALVE REPLACEMENT, TRANSFEMORAL N/A 11/25/2018   Procedure: TRANSCATHETER AORTIC VALVE REPLACEMENT, TRANSFEMORAL;  Surgeon: [name], MD;  Location: [place];  Service: Open Heart Surgery;  Laterality: N/A;  . TRANSTHORACIC ECHOCARDIOGRAM  03-23-2016   dr Irish Lack   EF 55-60%, reduced contribution atrial contraction to ventricular filling, due to increased ventricular diastolic pressure or atrial contratile dysfunction/  bioprosthetic AV w/ mod. regurg. (valve area 1.97cm^2)/ mild dilated ascending aorta/ mild thicken MV normal function annular ring prosthesis/severe LAE & mod. to sev.RAE/ PASP 97mHg/ mild TR/ ventricle septal motion show paradox  . TRANSURETHRAL RESECTION OF BLADDER TUMOR N/A 02/13/2016   Procedure: TRANSURETHRAL RESECTION OF BLADDER TUMOR (TURBT);  Surgeon: [name] MD;  [facility]  Service: Urology;  Laterality: N/A;  . TRANSURETHRAL RESECTION OF BLADDER TUMOR N/A 09/04/2016   Procedure: TRANSURETHRAL  RESECTION OF BLADDER TUMOR (TURBT);  Surgeon: Nickie Retort, MD;  Location: [facility];  Service: Urology;  Laterality: N/A;    Current Medications: Outpatient Medications Prior to Visit  Medication Sig Dispense Refill  . amoxicillin (AMOXIL) 500 MG tablet Take 4 tablets (2,000 mg total) by mouth as directed. Take 1 hour prior to dental work, including cleanings. 12 tablet 12  . aspirin 81 MG chewable tablet Chew 1 tablet (81 mg total) by mouth daily. 90 tablet 1  . atorvastatin (LIPITOR) 40 MG tablet TAKE 1 TABLET EVERY EVENING 90 tablet 2  . ELIQUIS 5 MG TABS tablet TAKE 1 TABLET TWICE A DAY 180 tablet 2  . ezetimibe (ZETIA) 10 MG tablet Take 1 tablet (10 mg total) by mouth every evening. 90 tablet 3  . furosemide (LASIX) 20 MG tablet Take 1 tablet (20 mg total) by mouth every other day. Pt may take additional doses if needed for leg swelling 30 tablet 12  . losartan (COZAAR) 25 MG tablet Take 1 tablet (25 mg  total) by mouth daily. 30 tablet 6  . metoprolol tartrate (LOPRESSOR) 25 MG tablet Take 0.5 tablets (12.5 mg total) by mouth 2 (two) times daily. 90 tablet 3   No facility-administered medications prior to visit.      Allergies:   Patient has no known allergies.   Social History   Socioeconomic History  . Marital status: Married    Spouse name: Not on file  . Number of children: Not on file  . Years of education: Not on file  . Highest education level: Not on file  Occupational History  . Not on file  Social Needs  . Financial resource strain: Not on file  . Food insecurity    Worry: Not on file    Inability: Not on file  . Transportation needs    Medical: Not on file    Non-medical: Not on file  Tobacco Use  . Smoking status: Never Smoker  . Smokeless tobacco: Never Used  Substance and Sexual Activity  . Alcohol use: Yes    Alcohol/week: 1.0 - 2.0 standard drinks    Types: 1 - 2 Standard drinks or equivalent per week    Comment: OCC  . Drug use: No  . Sexual activity: Not on file  Lifestyle  . Physical activity    Days per week: Not on file    Minutes per session: Not on file  . Stress: Not on file  Relationships  . Social Herbalist on phone: Not on file    Gets together: Not on file    Attends religious service: Not on file    Active member of club or organization: Not on file    Attends meetings of clubs or organizations: Not on file    Relationship status: Not on file  Other Topics Concern  . Not on file  Social History Narrative   Lives in [place] with wife.  Active.     Family History:  The patient's family history includes Leukemia in his mother; Supraventricular tachycardia in his sister.      ROS:   Please see the history of present illness.    ROS All other systems reviewed and are negative.   PHYSICAL EXAM:   VS:  BP 138/62   Pulse 71   Ht 6' (1.829 m)   Wt 180 lb 12.8 oz (82 kg)   SpO2 98%   BMI 24.52 kg/m  GEN: Well  nourished, well developed, in no acute distress HEENT: normal Neck: no JVD or masses Cardiac: RRR;  Soft flow murmur. No rubs, or gallops,no edema  Respiratory:  clear to auscultation bilaterally, normal work of breathing GI: soft, nontender, nondistended, + BS MS: no deformity or atrophy Skin: warm and dry, no rash Neuro:  Alert and Oriented x 3, Strength and sensation are intact Psych: euthymic mood, full affect    Wt Readings from Last 3 Encounters:  12/17/18 180 lb 12.8 oz (82 kg)  12/03/18 181 lb 9.6 oz (82.4 kg)  11/26/18 187 lb 6.3 oz (85 kg)      Studies/Labs Reviewed:   EKG:  EKG is NOT ordered today.    Recent Labs: 11/20/2018: B Natriuretic Peptide 380.4 11/26/2018: Hemoglobin 12.7; Magnesium 1.9; Platelets 116 12/03/2018: ALT 25; BUN 18; Creatinine, Ser 1.44; Potassium 4.2; Sodium 142   Lipid Panel No results found for: CHOL, TRIG, HDL, CHOLHDL, VLDL, LDLCALC, LDLDIRECT  Additional studies/ records that were reviewed today include:  TAVR OPERATIVE NOTE   Date of Procedure:11/25/2018  Preoperative Diagnosis:Severe Aortic Stenosis   Postoperative Diagnosis:Same   Procedure:   Valve in ValveTranscatheter Aortic Valve Replacement - PercutaneousrightTransfemoral Approach Edwards Sapien 3 THV (size 46m, model # 9600TFX, serial # [id number]  Co-Surgeons:Bryan KAlveria Apley MD and MSherren Mocha MD   Anesthesiologist:Dan STobias Alexander MD  EDala Dock MD.  Pre-operative Echo Findings: ? Severeprosthetic aortic valve insulfficiency ? Normalleft ventricular systolic function  Post-operative Echo Findings: ? Residual focalparavalvular leak around old bioprosthetic valve ? Normalleft ventricular systolic function  _____________   Echo 11/26/18: IMPRESSIONS 1. The left ventricle has low normal systolic function,  with an ejection fraction of 50-55%. The cavity size was normal. Moderate basal septal hypertrophy. Left ventricular diastolic function could not be evaluated secondary to atrial fibrillation.  Elevated left ventricular end-diastolic pressure. 2. The right ventricle has normal systolic function. The cavity was normal. There is no increase in right ventricular wall thickness. 3. Left atrial size was moderately dilated. 4. Right atrial size was moderately dilated. 5. The mitral valve is degenerative. Moderate thickening of the mitral valve leaflet. Moderate calcification of the mitral valve leaflet. There is severe mitral annular calcification present. Mild mitral valve stenosis. MV Mean grad: 5.3 mmHg 6. Tricuspid valve regurgitation is mild-moderate. 7. There is mild dilatation of the ascending aorta measuring 39 mm. 8. - TAVR: S/P 263mEdwards Sapien THV which appears to be functioning normally. There is no perivalvular leak. The mean AV gradient is 10.62m64m, AVA (Vmax) 2.14cm2, dimensionless index 0.55 and AV Vmax 219 cm/s.  _____________   Echo 12/17/18 IMPRESSIONS  1. The left ventricle has low normal systolic function, with an ejection fraction of 50-55%. The cavity size was normal. There is severe asymmetric left ventricular hypertrophy. Left ventricular diastolic function could not be evaluated due to mitral  valve replacement/repair. No evidence of left ventricular regional wall motion abnormalities.  2. The right ventricle has normal systolic function. The cavity was mildly enlarged. There is no increase in right ventricular wall thickness.  3. Left atrial size was mildly dilated.  4. Right atrial size was moderately dilated.  5. A Sorin annuloplasty ring valve is present in the mitral position.  6. Mild mitral valve stenosis.  7. The tricuspid valve is grossly normal.  8. A 80m37m EdwaWende Creaseien bioprosthetic aortic valve (TAVR) valve is present in the aortic  position. Procedure Date: 11/26/2018 Echo findings are consistent with perivalvular leak of the aortic  prosthesis.  9. Aortic valve regurgitation is mild by color flow Doppler. Mild stenosis of the aortic valve. 10. The aorta is abnormal in size and structure. 11. There is mild dilatation of the ascending aorta measuring 39 mm. SUMMARY LVEF 50-55%, moderate assymtric septal hypertrophy, mildly diltated RV with normal systolic function, mild LAE, moderate RAE, s/p 23 mm Edwards sapien 3 TAVR with mean gradient 14 mmHg and mild perivalvular leak, s/p mitral annuloplasty repair with mild stenosis and trivial MR, trivial TR, IVC not well-visualized  FINDINGS Left Ventricle: The left ventricle has low normal systolic function, with an ejection fraction of 50-55%. The cavity size was normal. There is severe asymmetric left ventricular hypertrophy. Left ventricular diastolic function could not be evaluated due to mitral valve replacement/repair. No evidence of left ventricular regional wall motion abnormalities..  ASSESSMENT & PLAN:   Prosthetic valve dysfunction s/p valve in valve TAVR:echo today showed EF 50%, normally functioning TAVR with mean gradient of 14 mm Hg and mild PVL. He has NHYA I symptoms. He has amoxicillin for SBE prophylaxis. He will continue on aspirin and Eliquis. He can stop aspirin after 6 months of therapy (05/2019).   S/p mitral valve repair: echo today shows mild stenosis.   HTN: BP mildly elevated today. Review of home logs show very labile BPs with some BP readings into 90/40s. He feels well even with these pressures. I have asked them to continue to monitor. If BP continues in those ranges we would stop the losartan.   Chronic diastolic CHF: he appears euvolemic on lasix 66m QOD. Recent BMET showed increase in creat which may be related to new Losartan start. Lasix dose was reduced. Will recheck BMET today.   CAD s/p CABG: pre TAVR cath showed patent SVG-->OM. Continue  medical therapy.   Recurrent DVT/PE:continue chronic anticoagulation   Persistent afib: rate well controlled today with HR 71. Continue on Eliquis for thromboembolic prophylaxis.  Incidental findings: noted during pre TAVR work up  Elevated Bilirubin: repeat labs showed persistently elevated bilirubin. I have forwarded this to his PCP, Dr. GLaurann Montana  Abnormal chest CT: Nonspecific basilar predominant findings that may indicate an interstitial lung disease, without significant bronchiectasis or honeycombing. Pulmonology consultation and follow-up high-resolution chest CT could be obtained in 3-6 months as clinically warranted.I have discussed this with patient and he would like to follow up with his PCP, Dr. GBeather Arbour  Ectatic AA: Ectatic 2.8 cm infrarenal abdominal aorta, at risk for aneurysm development. Recommend follow-up aortic ultrasound in 5 years.Will defer this to primary cardiologist, Dr. VIrish Lack     Medication Adjustments/Labs and Tests Ordered: Current medicines are reviewed at length with the patient today.  Concerns regarding medicines are outlined above.  Medication changes, Labs and Tests ordered today are listed in the Patient Instructions below. Patient Instructions  Medication Instructions:  1) STOP ASPIRIN May 28, 2019.  Labwork: TODAY: BMET  Testing/Procedures: Your provider has requested that you have an echocardiogram in 1 year . Echocardiography is a painless test that uses sound waves to create images of your heart. It provides your doctor with information about the size and shape of your heart and how well your heart's chambers and valves are working. This procedure takes approximately one hour. There are no restrictions for this procedure.    Follow-Up: You have an appointment with Dr. VIrish Lackon April 21, 2019 [facility] for your 1 year TAVR echo and office visit with KNell Rangeon 11/25/2019. Please arrive by 1:45PM  for your  echocardiogram and subsequent office visit.     Signed, Angelena Form, PA-C  12/17/2018 2:38 PM    [facility] [address], [place], [place]  [address] Phone: [phone]; Fax: [phone]

## 2018-12-16 NOTE — Telephone Encounter (Signed)
   Wife also answered NO to all screening questions.  COVID-19 Pre-Screening Questions:  . In the past 7 to 10 days have you had a cough,  shortness of breath, headache, congestion, fever (100 or greater) body aches, chills, sore throat, or sudden loss of taste or sense of smell? NO . Have you been around anyone with known Covid 19. NO . Have you been around anyone who is awaiting Covid 19 test results in the past 7 to 10 days? NO . Have you been around anyone who has been exposed to Covid 19, or has mentioned symptoms of Covid 19 within the past 7 to 10 days? NO  If you have any concerns/questions about symptoms patients report during screening (either on the phone or at threshold). Contact the provider seeing the patient or DOD for further guidance.  If neither are available contact a member of the leadership team.

## 2018-12-17 ENCOUNTER — Encounter: Payer: Self-pay | Admitting: Physician Assistant

## 2018-12-17 ENCOUNTER — Ambulatory Visit (INDEPENDENT_AMBULATORY_CARE_PROVIDER_SITE_OTHER): Payer: Medicare Other | Admitting: Physician Assistant

## 2018-12-17 ENCOUNTER — Other Ambulatory Visit: Payer: Medicare Other

## 2018-12-17 ENCOUNTER — Ambulatory Visit (HOSPITAL_COMMUNITY): Payer: Medicare Other | Attending: Internal Medicine

## 2018-12-17 ENCOUNTER — Other Ambulatory Visit (HOSPITAL_COMMUNITY): Payer: Medicare Other

## 2018-12-17 ENCOUNTER — Other Ambulatory Visit: Payer: Self-pay

## 2018-12-17 VITALS — BP 138/62 | HR 71 | Ht 72.0 in | Wt 180.8 lb

## 2018-12-17 DIAGNOSIS — R918 Other nonspecific abnormal finding of lung field: Secondary | ICD-10-CM

## 2018-12-17 DIAGNOSIS — I10 Essential (primary) hypertension: Secondary | ICD-10-CM | POA: Diagnosis not present

## 2018-12-17 DIAGNOSIS — I350 Nonrheumatic aortic (valve) stenosis: Secondary | ICD-10-CM | POA: Insufficient documentation

## 2018-12-17 DIAGNOSIS — R17 Unspecified jaundice: Secondary | ICD-10-CM

## 2018-12-17 DIAGNOSIS — I5032 Chronic diastolic (congestive) heart failure: Secondary | ICD-10-CM | POA: Diagnosis not present

## 2018-12-17 DIAGNOSIS — Z952 Presence of prosthetic heart valve: Secondary | ICD-10-CM | POA: Insufficient documentation

## 2018-12-17 DIAGNOSIS — I4811 Longstanding persistent atrial fibrillation: Secondary | ICD-10-CM

## 2018-12-17 DIAGNOSIS — I77811 Abdominal aortic ectasia: Secondary | ICD-10-CM

## 2018-12-17 DIAGNOSIS — Z86711 Personal history of pulmonary embolism: Secondary | ICD-10-CM

## 2018-12-17 NOTE — Patient Instructions (Addendum)
Medication Instructions:  1) STOP ASPIRIN May 28, 2019.  Labwork: TODAY: BMET  Testing/Procedures: Your provider has requested that you have an echocardiogram in 1 year . Echocardiography is a painless test that uses sound waves to create images of your heart. It provides your doctor with information about the size and shape of your heart and how well your heart's chambers and valves are working. This procedure takes approximately one hour. There are no restrictions for this procedure.    Follow-Up: You have an appointment with Dr. Irish Lack on April 21, 2019 at [place] are scheduled for your 1 year TAVR echo and office visit with Nell Range on 11/25/2019. Please arrive by 1:45PM for your echocardiogram and subsequent office visit.

## 2018-12-17 NOTE — Progress Notes (Signed)
[place] Cardiomyopathy Questionnaire  KCCQ-12 12/17/2018  Ability to shower/bathe Not at all limited  Ability to walk 1 block Not at all limited  Ability to hurry/jog Other, Did not do  Edema feet/ankles/legs Never over the past 2 weeks  Limited by fatigue Never over the past 2 weeks  Limited by dyspnea Never over the past 2 weeks  Sitting up / on 3+ pillows Never over the past 2 weeks  Limited enjoyment of life Slightly limited  Rest of life w/ symptoms Somewhat satisfied  Participation in hobbies Moderately limited  Participation in chores Moderately limited  Visiting family/friends Moderately limited

## 2018-12-18 ENCOUNTER — Telehealth (HOSPITAL_COMMUNITY): Payer: Self-pay

## 2018-12-18 ENCOUNTER — Telehealth: Payer: Medicare Other | Admitting: Physician Assistant

## 2018-12-18 LAB — BASIC METABOLIC PANEL
BUN/Creatinine Ratio: 15 (ref 10–24)
BUN: 14 mg/dL (ref 8–27)
CO2: 26 mmol/L (ref 20–29)
Calcium: 9.7 mg/dL (ref 8.6–10.2)
Chloride: 102 mmol/L (ref 96–106)
Creatinine, Ser: 0.96 mg/dL (ref 0.76–1.27)
GFR calc Af Amer: 82 mL/min/{1.73_m2} (ref >59)
GFR calc non Af Amer: 71 mL/min/{1.73_m2} (ref >59)
Glucose: 86 mg/dL (ref 65–99)
Potassium: 5 mmol/L (ref 3.5–5.2)
Sodium: 141 mmol/L (ref 134–144)

## 2018-12-22 ENCOUNTER — Other Ambulatory Visit: Payer: Self-pay

## 2018-12-22 MED ORDER — LOSARTAN POTASSIUM 25 MG PO TABS: 25.0000 mg | ORAL_TABLET | Freq: Every day | ORAL | 3 refills | Status: DC

## 2018-12-30 ENCOUNTER — Telehealth (HOSPITAL_COMMUNITY): Payer: Self-pay | Admitting: Pharmacist

## 2018-12-30 NOTE — Progress Notes (Signed)
Cardiac Rehab Medication Review   Does the patient  feel that his/her medications are working for him/her?  yes  Has the patient been experiencing any side effects to the medications prescribed?  no  Does the patient measure his/her own blood pressure or blood glucose at home?  yes   Does the patient have any problems obtaining medications due to transportation or finances?   no  Understanding of regimen: excellent Understanding of indications: excellent Potential of compliance: excellent    Pt and wife are well versed with pt medications.  Pt has a Blood pressure machine at home and checks his bp twice a day.    Maurice Small RN, BSN Cardiac and Pulmonary Rehab Nurse Navigator  12/30/2018 10:58 AM

## 2019-01-01 ENCOUNTER — Encounter (HOSPITAL_COMMUNITY): Payer: Self-pay

## 2019-01-01 ENCOUNTER — Encounter (HOSPITAL_COMMUNITY)
Admission: RE | Admit: 2019-01-01 | Discharge: 2019-01-01 | Disposition: A | Discharge status: Home / Self Care | Payer: Medicare Other | Source: Ambulatory Visit | Attending: Cardiovascular Disease | Admitting: Cardiovascular Disease

## 2019-01-01 ENCOUNTER — Other Ambulatory Visit: Payer: Self-pay

## 2019-01-01 VITALS — BP 104/70 | HR 71 | Temp 97.7°F | Ht 71.0 in | Wt 181.9 lb

## 2019-01-01 DIAGNOSIS — Z952 Presence of prosthetic heart valve: Secondary | ICD-10-CM

## 2019-01-01 NOTE — Progress Notes (Signed)
Cardiac Individual Treatment Plan  Patient Details  Name: Eric [name] MRN: [id number] Date of Birth: [birth year] Referring Provider:     CARDIAC REHAB PHASE II ORIENTATION from 01/01/2019 in [place]  Referring Provider  Dr. 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  amoxicillin (AMOXIL) 500 MG tablet, Take 4 tablets (2,000 mg total) by mouth as directed. Take 1 hour prior to dental work, including cleanings., Disp: 12 tablet, Rfl: 12 .  aspirin 81 MG chewable tablet, Chew 1 tablet (81 mg total) by mouth daily., Disp: 90 tablet, Rfl: 1 .  atorvastatin (LIPITOR) 40 MG tablet, TAKE 1 TABLET EVERY EVENING, Disp: 90 tablet, Rfl: 2 .  ELIQUIS 5 MG TABS tablet, TAKE 1 TABLET TWICE A DAY, Disp: 180 tablet, Rfl: 2 .  ezetimibe (ZETIA) 10 MG tablet, Take 1 tablet (10 mg total) by mouth every evening., Disp: 90 tablet, Rfl: 3 .  furosemide (LASIX) 20 MG tablet, Take 1 tablet (20 mg total) by mouth every other day. Pt may take additional doses if needed for leg swelling, Disp: 30 tablet, Rfl: 12 .  losartan (COZAAR) 25 MG tablet, Take 1 tablet (25 mg total) by mouth daily., Disp: 90 tablet, Rfl: 3 .  metoprolol tartrate (LOPRESSOR) 25 MG tablet, Take 0.5 tablets (12.5 mg total) by mouth 2 (two) times daily., Disp: 90 tablet, Rfl: 3  Past Medical History: Past Medical History:  Diagnosis Date  . Atrial flutter, paroxysmal ([place])    a. dx 04-02-2014, s/p  successful cardioversion 04-06-2014.  Marland Kitchen Chronic anticoagulation    on Eliquis--  due to recurrent dvt's and pe's  . Dyslipidemia   . 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 History of DVT (deep vein thrombosis)    2000-- RLE  . History of melanoma excision    left flank;  07/ 2014 left lower leg and right upper chest  . History of prostate cancer    Gleason 6--  s/p  radical prostatectomy 06/ 2000 in [place], [place]---  no recurrence  . History of pulmonary embolus (PE)    2000 & 2005  bilateral  . Hx of valvuloplasty 06/19/2007   a. s/p mitral ring annuloplasty, repair ruptured chordae of P2 flail segments of MV  . S/P aortic valve replacement with bioprosthetic valve    06-19-2007  severe aortic valve stenosis  . 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 Single vessel coronary artery disease   cardiologist-  dr Irish Lack   a. 05/2007 CABG x 1: s/p SVG-OM;  b. 10/2010 Ex MV: EF 72%, inf attenuation w/o ischemia, brief run of PAT with exercise. To  . Thrombocytopenia ([place])   . Thyroid goiter 2013   nodular  . Wears hearing aid    BILATERAL  . Wears partial dentures    UPPER    Tobacco Use: Social History   Tobacco Use  Smoking Status Never Smoker  Smokeless Tobacco Never Used    Labs: Recent Review Flowsheet Data    Labs for ITP Cardiac and Pulmonary Rehab Latest Ref Rng & Units 06/20/2007 10/29/2018 10/29/2018 11/20/2018 11/25/2018   Hemoglobin A1c  4.8 - 5.6 % - - - 5.4 -   PHART 7.350 - 7.450 7.446 - 7.383 7.479(H) -   PCO2ART 32.0 - 48.0 mmHg 30.7(L) - 39.3 32.8 -   HCO3 20.0 - 28.0 mmol/L 21.1 24.0 23.4 24.1 -   TCO2 22 - 32 mmol/L '22 25 25 ' - 27   ACIDBASEDEF 0.0 - 2.0 mmol/L 2.0 1.0 1.0 - -   O2SAT % - 71.0 97.0 99.3 -      Capillary Blood Glucose: No results found for: GLUCAP   Exercise Target Goals: Exercise Program Goal: Individual exercise prescription set using results from initial 6 min walk test and THRR while considering  patient's activity barriers and safety.   Exercise Prescription Goal: Starting with aerobic activity 30 plus minutes a day, 3 days per week for initial exercise prescription. Provide home exercise  prescription and guidelines that participant acknowledges understanding prior to discharge.  Activity Barriers & Risk Stratification: Activity Barriers & Cardiac Risk Stratification - 01/01/19 1113      Activity Barriers & Cardiac Risk Stratification   Activity Barriers  Deconditioning;History of Falls;Balance Concerns    Cardiac Risk Stratification  High       6 Minute Walk: 6 Minute Walk    Row Name 01/01/19 1112         6 Minute Walk   Phase  Initial     Distance  1182 feet     Walk Time  6 minutes     # of Rest Breaks  0     MPH  2.2     METS  1.81     RPE  12     Perceived Dyspnea   0     VO2 Peak  6.33     Symptoms  No     Resting HR  71 bpm     Resting BP  104/70     Resting Oxygen Saturation   97 %     Exercise Oxygen Saturation  during 6 min walk  98 %     Max Ex. HR  77 bpm     Max Ex. BP  142/72     2 Minute Post BP  108/70        Oxygen Initial Assessment:   Oxygen Re-Evaluation:   Oxygen Discharge (Final Oxygen Re-Evaluation):   Initial Exercise Prescription: Initial Exercise Prescription - 01/01/19 1100      Date of Initial Exercise RX and Referring Provider   Date  01/01/19    Referring Provider  Dr. [name]    Expected Discharge Date  02/13/19      Recumbant Bike   Level  1.5    Watts  5    Minutes  15    METs  2.2      NuStep   Level  2    SPM  75    Minutes  15    METs  1.9      Prescription Details   Frequency (times per week)  3    Duration  Progress to 30 minutes of continuous aerobic without signs/symptoms of physical distress      Intensity   THRR 40-80% of Max Heartrate  53-106    Ratings of Perceived Exertion  11-13      Progression   Progression  Continue to progress workloads to maintain intensity without signs/symptoms of physical distress.      Resistance Training   Training Prescription  Yes    Weight  3  lbs    Reps  10-15       Perform Capillary Blood Glucose checks as needed.  Exercise Prescription  Changes:   Exercise Comments:   Exercise Goals and Review: Exercise Goals    Row Name 01/01/19 1118             Exercise Goals   Increase Physical Activity  Yes       Intervention  Provide advice, education, support and counseling about physical activity/exercise needs.;Develop an individualized exercise prescription for aerobic and resistive training based on initial evaluation findings, risk stratification, comorbidities and participant's personal goals.       Expected Outcomes  Short Term: Attend rehab on a regular basis to increase amount of physical activity.;Long Term: Add in home exercise to make exercise part of routine and to increase amount of physical activity.;Long Term: Exercising regularly at least 3-5 days a week.       Increase Strength and Stamina  Yes       Intervention  Provide advice, education, support and counseling about physical activity/exercise needs.;Develop an individualized exercise prescription for aerobic and resistive training based on initial evaluation findings, risk stratification, comorbidities and participant's personal goals.       Expected Outcomes  Short Term: Increase workloads from initial exercise prescription for resistance, speed, and METs.;Short Term: Perform resistance training exercises routinely during rehab and add in resistance training at home;Long Term: Improve cardiorespiratory fitness, muscular endurance and strength as measured by increased METs and functional capacity (6MWT)       Able to understand and use rate of perceived exertion (RPE) scale  Yes       Intervention  Provide education and explanation on how to use RPE scale       Expected Outcomes  Short Term: Able to use RPE daily in rehab to express subjective intensity level;Long Term:  Able to use RPE to guide intensity level when exercising independently       Knowledge and understanding of Target Heart Rate Range (THRR)  Yes       Intervention  Provide education and  explanation of THRR including how the numbers were predicted and where they are located for reference       Expected Outcomes  Short Term: Able to state/look up THRR;Long Term: Able to use THRR to govern intensity when exercising independently;Short Term: Able to use daily as guideline for intensity in rehab       Able to check pulse independently  Yes       Intervention  Provide education and demonstration on how to check pulse in carotid and radial arteries.;Review the importance of being able to check your own pulse for safety during independent exercise       Expected Outcomes  Short Term: Able to explain why pulse checking is important during independent exercise;Long Term: Able to check pulse independently and accurately       Understanding of Exercise Prescription  Yes       Intervention  Provide education, explanation, and written materials on patient's individual exercise prescription       Expected Outcomes  Short Term: Able to explain program exercise prescription;Long Term: Able to explain home exercise prescription to exercise independently          Exercise Goals Re-Evaluation :    Discharge Exercise Prescription (Final Exercise Prescription Changes):   Nutrition:  Target Goals: Understanding of nutrition guidelines, daily intake of sodium 1500mg , cholesterol 200mg , calories 30% from fat and 7% or  less from saturated fats, daily to have 5 or more servings of fruits and vegetables.  Biometrics: Pre Biometrics - 01/01/19 1118      Pre Biometrics   Height  5\' 11"  (1.803 m)    Weight  82.5 kg    Waist Circumference  37 inches    Hip Circumference  38.5 inches    Waist to Hip Ratio  0.96 %    BMI (Calculated)  25.38    Triceps Skinfold  18 mm    % Body Fat  26.4 %    Grip Strength  36 kg    Flexibility  0 in    Single Leg Stand  1.12 seconds        Nutrition Therapy Plan and Nutrition Goals:   Nutrition Assessments:   Nutrition Goals  Re-Evaluation:   Nutrition Goals Discharge (Final Nutrition Goals Re-Evaluation):   Psychosocial: Target Goals: Acknowledge presence or absence of significant depression and/or stress, maximize coping skills, provide positive support system. Participant is able to verbalize types and ability to use techniques and skills needed for reducing stress and depression.  Initial Review & Psychosocial Screening: Initial Psych Review & Screening - 01/01/19 0939      Initial Review   Current issues with  None Identified      Family Dynamics   Good Support System?  Yes   Eric [name] has his wife for support     Barriers   Psychosocial barriers to participate in program  There are no identifiable barriers or psychosocial needs.      Screening Interventions   Interventions  Encouraged to exercise       Quality of Life Scores: Quality of Life - 01/01/19 1005      Quality of Life   Select  Quality of Life      Quality of Life Scores   Health/Function Pre  27.9 %    Socioeconomic Pre  30 %    Psych/Spiritual Pre  29.14 %    Family Pre  30 %    GLOBAL Pre  28.9 %      Scores of 19 and below usually indicate a poorer quality of life in these areas.  A difference of  2-3 points is a clinically meaningful difference.  A difference of 2-3 points in the total score of the Quality of Life Index has been associated with significant improvement in overall quality of life, self-image, physical symptoms, and general health in studies assessing change in quality of life.  PHQ-9: Recent Review Flowsheet Data    Depression screen [facility] 2/9 01/01/2019   Decreased Interest 0   Down, Depressed, Hopeless 0   PHQ - 2 Score 0     Interpretation of Total Score  Total Score Depression Severity:  1-4 = Minimal depression, 5-9 = Mild depression, 10-14 = Moderate depression, 15-19 = Moderately severe depression, 20-27 = Severe depression   Psychosocial Evaluation and Intervention:   Psychosocial  Re-Evaluation:   Psychosocial Discharge (Final Psychosocial Re-Evaluation):   Vocational Rehabilitation: Provide vocational rehab assistance to qualifying candidates.   Vocational Rehab Evaluation & Intervention: Vocational Rehab - 01/01/19 1025      Initial Vocational Rehab Evaluation & Intervention   Assessment shows need for Vocational Rehabilitation  No   Eric [name] is retired and does not need vocational rehab at this time      Education: Education Goals: Education classes will be provided on a weekly basis, covering required topics. Participant will state understanding/return demonstration of topics presented.  Learning Barriers/Preferences: Learning Barriers/Preferences - 01/01/19 1139      Learning Barriers/Preferences   Learning Barriers  Hearing    Learning Preferences  None       Education Topics: Hypertension, Hypertension Reduction -Define heart disease and high blood pressure. Discus how high blood pressure affects the body and ways to reduce high blood pressure.   Exercise and Your Heart -Discuss why it is important to exercise, the FITT principles of exercise, normal and abnormal responses to exercise, and how to exercise safely.   Angina -Discuss definition of angina, causes of angina, treatment of angina, and how to decrease risk of having angina.   Cardiac Medications -Review what the following cardiac medications are used for, how they affect the body, and side effects that may occur when taking the medications.  Medications include Aspirin, Beta blockers, calcium channel blockers, ACE Inhibitors, angiotensin receptor blockers, diuretics, digoxin, and antihyperlipidemics.   Congestive Heart Failure -Discuss the definition of CHF, how to live with CHF, the signs and symptoms of CHF, and how keep track of weight and sodium intake.   Heart Disease and Intimacy -Discus the effect sexual activity has on the heart, how changes occur during intimacy as we  age, and safety during sexual activity.   Smoking Cessation / COPD -Discuss different methods to quit smoking, the health benefits of quitting smoking, and the definition of COPD.   Nutrition I: Fats -Discuss the types of cholesterol, what cholesterol does to the heart, and how cholesterol levels can be controlled.   Nutrition II: Labels -Discuss the different components of food labels and how to read food label   Heart Parts/Heart Disease and PAD -Discuss the anatomy of the heart, the pathway of blood circulation through the heart, and these are affected by heart disease.   Stress I: Signs and Symptoms -Discuss the causes of stress, how stress may lead to anxiety and depression, and ways to limit stress.   Stress II: Relaxation -Discuss different types of relaxation techniques to limit stress.   Warning Signs of Stroke / TIA -Discuss definition of a stroke, what the signs and symptoms are of a stroke, and how to identify when someone is having stroke.   Knowledge Questionnaire Score: Knowledge Questionnaire Score - 01/01/19 1006      Knowledge Questionnaire Score   Pre Score  18/24       Core Components/Risk Factors/Patient Goals at Admission: Personal Goals and Risk Factors at Admission - 01/01/19 1119      Core Components/Risk Factors/Patient Goals on Admission    Weight Management  Yes;Weight Maintenance    Admit Weight  181 lb 14.1 oz (82.5 kg)    Hypertension  Yes    Intervention  Provide education on lifestyle modifcations including regular physical activity/exercise, weight management, moderate sodium restriction and increased consumption of fresh fruit, vegetables, and low fat dairy, alcohol moderation, and smoking cessation.;Monitor prescription use compliance.    Expected Outcomes  Short Term: Continued assessment and intervention until BP is < 140/20mm HG in hypertensive participants. < 130/22mm HG in hypertensive participants with diabetes, heart failure or  chronic kidney disease.;Long Term: Maintenance of blood pressure at goal levels.    Lipids  Yes    Intervention  Provide education and support for participant on nutrition & aerobic/resistive exercise along with prescribed medications to achieve LDL 70mg , HDL >40mg .    Expected Outcomes  Long Term: Cholesterol controlled with medications as prescribed, with individualized exercise RX and with personalized nutrition plan. Value goals:  LDL < 70mg , HDL > 40 mg.;Short Term: Participant states understanding of desired cholesterol values and is compliant with medications prescribed. Participant is following exercise prescription and nutrition guidelines.       Core Components/Risk Factors/Patient Goals Review:    Core Components/Risk Factors/Patient Goals at Discharge (Final Review):    ITP Comments: ITP Comments    Row Name 01/01/19 1005           ITP Comments  Dr. Fransico Him, Medical Director          Comments: Eric [name] attended orientation on 01/01/2019 to review rules and guidelines for program.  Completed 6 minute walk test, Intitial ITP, and exercise prescription.  VSS. Telemetry-chronic atrial fib with PVC's.  Asymptomatic. Safety measures and social distancing in place per CDC guidelines.Barnet Pall, RN,BSN 01/01/2019 11:45 AM

## 2019-01-05 ENCOUNTER — Other Ambulatory Visit: Payer: Self-pay

## 2019-01-05 ENCOUNTER — Encounter (HOSPITAL_COMMUNITY)
Admission: RE | Admit: 2019-01-05 | Discharge: 2019-01-05 | Disposition: A | Discharge status: Home / Self Care | Payer: Medicare Other | Source: Ambulatory Visit | Attending: Cardiovascular Disease | Admitting: Cardiovascular Disease

## 2019-01-05 DIAGNOSIS — Z952 Presence of prosthetic heart valve: Secondary | ICD-10-CM

## 2019-01-05 NOTE — Progress Notes (Signed)
Daily Session Note  Patient Details  Name: Eric [name] MRN: [id number] Date of Birth: 11/23/1929 Referring Provider:     [place] from 01/01/2019 in [place]  Referring Provider  Dr. [name]      Encounter Date: 01/05/2019  Check In: Session Check In - 01/05/19 1323      Check-In   Supervising physician immediately available to respond to emergencies  [facility] immediately available    Physician(s)  Dr. [place]    Location  [facility]    Staff Present  Rosebud Poles, RN, Deland Pretty, MS, ACSM CEP, Exercise Physiologist;Tara Karle Starch, RN, Marga Melnick, RN, BSN    Virtual Visit  No    Medication changes reported      No    Fall or balance concerns reported     Yes    Comments  Pt has fallen three times in the last year due to issues with his balance.    Tobacco Cessation  No Change    Warm-up and Cool-down  Performed on first and last piece of equipment    Resistance Training Performed  Yes    VAD Patient?  No    PAD/SET Patient?  No      Pain Assessment   Currently in Pain?  No/denies       Capillary Blood Glucose: No results found for this or any previous visit (from the past 24 hour(s)).    Social History   Tobacco Use  Smoking Status Never Smoker  Smokeless Tobacco Never Used    Goals Met:  No report of cardiac concerns or symptoms  Goals Unmet:  Not Applicable  Comments: Eric [name] started cardiac rehab today.  Pt tolerated light exercise without difficulty. VSS, telemetry-Atrial fib chronic, asymptomatic.  Medication list reconciled. Pt denies barriers to medicaiton compliance.  PSYCHOSOCIAL ASSESSMENT:  PHQ-0. Pt exhibits positive coping skills, hopeful outlook with supportive family. No psychosocial needs identified at this time, no psychosocial interventions necessary.    Pt enjoys journalism and cars.   Pt oriented to exercise equipment and routine.    Understanding  verbalized.Barnet Pall, RN,BSN 01/05/2019 1:34 PM   Dr. Fransico Him is Medical Director for Cardiac Rehab at [facility]

## 2019-01-07 ENCOUNTER — Other Ambulatory Visit: Payer: Self-pay

## 2019-01-07 ENCOUNTER — Encounter (HOSPITAL_COMMUNITY)
Admission: RE | Admit: 2019-01-07 | Discharge: 2019-01-07 | Disposition: A | Discharge status: Home / Self Care | Payer: Medicare Other | Source: Ambulatory Visit | Attending: Cardiovascular Disease | Admitting: Cardiovascular Disease

## 2019-01-07 DIAGNOSIS — Z952 Presence of prosthetic heart valve: Secondary | ICD-10-CM

## 2019-01-08 NOTE — Progress Notes (Signed)
Cardiac Individual Treatment Plan  Patient Details  Name: Eric [name] MRN: [id number] Date of Birth: [birth year] Referring Provider:     CARDIAC REHAB PHASE II ORIENTATION from 01/01/2019 in [place]  Referring Provider  Dr. 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  amoxicillin (AMOXIL) 500 MG tablet, Take 4 tablets (2,000 mg total) by mouth as directed. Take 1 hour prior to dental work, including cleanings., Disp: 12 tablet, Rfl: 12 .  aspirin 81 MG chewable tablet, Chew 1 tablet (81 mg total) by mouth daily., Disp: 90 tablet, Rfl: 1 .  atorvastatin (LIPITOR) 40 MG tablet, TAKE 1 TABLET EVERY EVENING, Disp: 90 tablet, Rfl: 2 .  ELIQUIS 5 MG TABS tablet, TAKE 1 TABLET TWICE A DAY, Disp: 180 tablet, Rfl: 2 .  ezetimibe (ZETIA) 10 MG tablet, Take 1 tablet (10 mg total) by mouth every evening., Disp: 90 tablet, Rfl: 3 .  furosemide (LASIX) 20 MG tablet, Take 1 tablet (20 mg total) by mouth every other day. Pt may take additional doses if needed for leg swelling, Disp: 30 tablet, Rfl: 12 .  losartan (COZAAR) 25 MG tablet, Take 1 tablet (25 mg total) by mouth daily., Disp: 90 tablet, Rfl: 3 .  metoprolol tartrate (LOPRESSOR) 25 MG tablet, Take 0.5 tablets (12.5 mg total) by mouth 2 (two) times daily., Disp: 90 tablet, Rfl: 3  Past Medical History: Past Medical History:  Diagnosis Date  . Atrial flutter, paroxysmal ([place])    a. dx 04-02-2014, s/p  successful cardioversion 04-06-2014.  Marland Kitchen Chronic anticoagulation    on Eliquis--  due to recurrent dvt's and pe's  . Dyslipidemia   . 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 History of DVT (deep vein thrombosis)    2000-- RLE  . History of melanoma excision    left flank;  07/ 2014 left lower leg and right upper chest  . History of prostate cancer    Gleason 6--  s/p  radical prostatectomy 06/ 2000 in [place], [place]---  no recurrence  . History of pulmonary embolus (PE)    2000 & 2005  bilateral  . Hx of valvuloplasty 06/19/2007   a. s/p mitral ring annuloplasty, repair ruptured chordae of P2 flail segments of MV  . S/P aortic valve replacement with bioprosthetic valve    06-19-2007  severe aortic valve stenosis  . 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 Single vessel coronary artery disease   cardiologist-  dr Irish Lack   a. 05/2007 CABG x 1: s/p SVG-OM;  b. 10/2010 Ex MV: EF 72%, inf attenuation w/o ischemia, brief run of PAT with exercise. To  . Thrombocytopenia ([place])   . Thyroid goiter 2013   nodular  . Wears hearing aid    BILATERAL  . Wears partial dentures    UPPER    Tobacco Use: Social History   Tobacco Use  Smoking Status Never Smoker  Smokeless Tobacco Never Used    Labs: Recent Review Flowsheet Data    Labs for ITP Cardiac and Pulmonary Rehab Latest Ref Rng & Units 06/20/2007 10/29/2018 10/29/2018 11/20/2018 11/25/2018   Hemoglobin A1c  4.8 - 5.6 % - - - 5.4 -   PHART 7.350 - 7.450 7.446 - 7.383 7.479(H) -   PCO2ART 32.0 - 48.0 mmHg 30.7(L) - 39.3 32.8 -   HCO3 20.0 - 28.0 mmol/L 21.1 24.0 23.4 24.1 -   TCO2 22 - 32 mmol/L '22 25 25 ' - 27   ACIDBASEDEF 0.0 - 2.0 mmol/L 2.0 1.0 1.0 - -   O2SAT % - 71.0 97.0 99.3 -      Capillary Blood Glucose: No results found for: GLUCAP   Exercise Target Goals: Exercise Program Goal: Individual exercise prescription set using results from initial 6 min walk test and THRR while considering  patient's activity barriers and safety.   Exercise Prescription Goal: Starting with aerobic activity 30 plus minutes a day, 3 days per week for initial exercise prescription. Provide home exercise  prescription and guidelines that participant acknowledges understanding prior to discharge.  Activity Barriers & Risk Stratification: Activity Barriers & Cardiac Risk Stratification - 01/01/19 1113      Activity Barriers & Cardiac Risk Stratification   Activity Barriers  Deconditioning;History of Falls;Balance Concerns    Cardiac Risk Stratification  High       6 Minute Walk: 6 Minute Walk    Row Name 01/01/19 1112         6 Minute Walk   Phase  Initial     Distance  1182 feet     Walk Time  6 minutes     # of Rest Breaks  0     MPH  2.2     METS  1.81     RPE  12     Perceived Dyspnea   0     VO2 Peak  6.33     Symptoms  No     Resting HR  71 bpm     Resting BP  104/70     Resting Oxygen Saturation   97 %     Exercise Oxygen Saturation  during 6 min walk  98 %     Max Ex. HR  77 bpm     Max Ex. BP  142/72     2 Minute Post BP  108/70        Oxygen Initial Assessment:   Oxygen Re-Evaluation:   Oxygen Discharge (Final Oxygen Re-Evaluation):   Initial Exercise Prescription: Initial Exercise Prescription - 01/01/19 1100      Date of Initial Exercise RX and Referring Provider   Date  01/01/19    Referring Provider  Dr. [name]    Expected Discharge Date  02/13/19      Recumbant Bike   Level  1.5    Watts  5    Minutes  15    METs  2.2      NuStep   Level  2    SPM  75    Minutes  15    METs  1.9      Prescription Details   Frequency (times per week)  3    Duration  Progress to 30 minutes of continuous aerobic without signs/symptoms of physical distress      Intensity   THRR 40-80% of Max Heartrate  53-106    Ratings of Perceived Exertion  11-13      Progression   Progression  Continue to progress workloads to maintain intensity without signs/symptoms of physical distress.      Resistance Training   Training Prescription  Yes    Weight  3  lbs    Reps  10-15       Perform Capillary Blood Glucose checks as needed.  Exercise Prescription  Changes: Exercise Prescription Changes    Row Name 01/05/19 1325             Response to Exercise   Blood Pressure (Admit)  128/64       Blood Pressure (Exercise)  128/72       Blood Pressure (Exit)  118/82       Heart Rate (Admit)  71 bpm       Heart Rate (Exercise)  87 bpm       Heart Rate (Exit)  71 bpm       Rating of Perceived Exertion (Exercise)  11       Symptoms  none       Duration  Progress to 30 minutes of  aerobic without signs/symptoms of physical distress       Intensity  THRR unchanged         Progression   Progression  Continue to progress workloads to maintain intensity without signs/symptoms of physical distress.       Average METs  2.4         Resistance Training   Training Prescription  Yes       Weight  3 lbs       Reps  10-15       Time  10 Minutes         Recumbant Bike   Level  1.5       Minutes  15         NuStep   Level  2       SPM  75       Minutes  15       METs  2.4          Exercise Comments: Exercise Comments    Row Name 01/05/19 1421           Exercise Comments  Pt tolerated 1st session of exercise well without c/o.          Exercise Goals and Review: Exercise Goals    Row Name 01/01/19 1118             Exercise Goals   Increase Physical Activity  Yes       Intervention  Provide advice, education, support and counseling about physical activity/exercise needs.;Develop an individualized exercise prescription for aerobic and resistive training based on initial evaluation findings, risk stratification, comorbidities and participant's personal goals.       Expected Outcomes  Short Term: Attend rehab on a regular basis to increase amount of physical activity.;Long Term: Add in home exercise to make exercise part of routine and to increase amount of physical activity.;Long Term: Exercising regularly at least 3-5 days a week.       Increase Strength and Stamina  Yes       Intervention  Provide advice, education, support and  counseling about physical activity/exercise needs.;Develop an individualized exercise prescription for aerobic and resistive training based on initial evaluation findings, risk stratification, comorbidities and participant's personal goals.       Expected Outcomes  Short Term: Increase workloads from initial exercise prescription for resistance, speed, and METs.;Short Term: Perform resistance training exercises routinely during rehab and add in resistance training at home;Long Term: Improve cardiorespiratory fitness, muscular endurance and strength as measured by increased METs and functional capacity (6MWT)       Able to  understand and use rate of perceived exertion (RPE) scale  Yes       Intervention  Provide education and explanation on how to use RPE scale       Expected Outcomes  Short Term: Able to use RPE daily in rehab to express subjective intensity level;Long Term:  Able to use RPE to guide intensity level when exercising independently       Knowledge and understanding of Target Heart Rate Range (THRR)  Yes       Intervention  Provide education and explanation of THRR including how the numbers were predicted and where they are located for reference       Expected Outcomes  Short Term: Able to state/look up THRR;Long Term: Able to use THRR to govern intensity when exercising independently;Short Term: Able to use daily as guideline for intensity in rehab       Able to check pulse independently  Yes       Intervention  Provide education and demonstration on how to check pulse in carotid and radial arteries.;Review the importance of being able to check your own pulse for safety during independent exercise       Expected Outcomes  Short Term: Able to explain why pulse checking is important during independent exercise;Long Term: Able to check pulse independently and accurately       Understanding of Exercise Prescription  Yes       Intervention  Provide education, explanation, and written materials  on patient's individual exercise prescription       Expected Outcomes  Short Term: Able to explain program exercise prescription;Long Term: Able to explain home exercise prescription to exercise independently          Exercise Goals Re-Evaluation : Exercise Goals Re-Evaluation    Row Name 01/05/19 1421             Exercise Goal Re-Evaluation   Exercise Goals Review  Increase Physical Activity;Able to understand and use rate of perceived exertion (RPE) scale       Comments  Pt able to understand and use RPE scale appropriately.       Expected Outcomes  Increase workloads as tolerated to help improve cardiorespiratory fitness.           Discharge Exercise Prescription (Final Exercise Prescription Changes): Exercise Prescription Changes - 01/05/19 1325      Response to Exercise   Blood Pressure (Admit)  128/64    Blood Pressure (Exercise)  128/72    Blood Pressure (Exit)  118/82    Heart Rate (Admit)  71 bpm    Heart Rate (Exercise)  87 bpm    Heart Rate (Exit)  71 bpm    Rating of Perceived Exertion (Exercise)  11    Symptoms  none    Duration  Progress to 30 minutes of  aerobic without signs/symptoms of physical distress    Intensity  THRR unchanged      Progression   Progression  Continue to progress workloads to maintain intensity without signs/symptoms of physical distress.    Average METs  2.4      Resistance Training   Training Prescription  Yes    Weight  3 lbs    Reps  10-15    Time  10 Minutes      Recumbant Bike   Level  1.5    Minutes  15      NuStep   Level  2    SPM  75    Minutes  15    METs  2.4       Nutrition:  Target Goals: Understanding of nutrition guidelines, daily intake of sodium 1500mg , cholesterol 200mg , calories 30% from fat and 7% or less from saturated fats, daily to have 5 or more servings of fruits and vegetables.  Biometrics: Pre Biometrics - 01/01/19 1118      Pre Biometrics   Height  5\' 11"  (1.803 m)    Weight  82.5 kg     Waist Circumference  37 inches    Hip Circumference  38.5 inches    Waist to Hip Ratio  0.96 %    BMI (Calculated)  25.38    Triceps Skinfold  18 mm    % Body Fat  26.4 %    Grip Strength  36 kg    Flexibility  0 in    Single Leg Stand  1.12 seconds        Nutrition Therapy Plan and Nutrition Goals:   Nutrition Assessments:   Nutrition Goals Re-Evaluation:   Nutrition Goals Discharge (Final Nutrition Goals Re-Evaluation):   Psychosocial: Target Goals: Acknowledge presence or absence of significant depression and/or stress, maximize coping skills, provide positive support system. Participant is able to verbalize types and ability to use techniques and skills needed for reducing stress and depression.  Initial Review & Psychosocial Screening: Initial Psych Review & Screening - 01/01/19 0939      Initial Review   Current issues with  None Identified      Family Dynamics   Good Support System?  Yes   Eric [name] has his wife for support     Barriers   Psychosocial barriers to participate in program  There are no identifiable barriers or psychosocial needs.      Screening Interventions   Interventions  Encouraged to exercise       Quality of Life Scores: Quality of Life - 01/01/19 1005      Quality of Life   Select  Quality of Life      Quality of Life Scores   Health/Function Pre  27.9 %    Socioeconomic Pre  30 %    Psych/Spiritual Pre  29.14 %    Family Pre  30 %    GLOBAL Pre  28.9 %      Scores of 19 and below usually indicate a poorer quality of life in these areas.  A difference of  2-3 points is a clinically meaningful difference.  A difference of 2-3 points in the total score of the Quality of Life Index has been associated with significant improvement in overall quality of life, self-image, physical symptoms, and general health in studies assessing change in quality of life.  PHQ-9: Recent Review Flowsheet Data    Depression screen [facility] 2/9 01/01/2019    Decreased Interest 0   Down, Depressed, Hopeless 0   PHQ - 2 Score 0     Interpretation of Total Score  Total Score Depression Severity:  1-4 = Minimal depression, 5-9 = Mild depression, 10-14 = Moderate depression, 15-19 = Moderately severe depression, 20-27 = Severe depression   Psychosocial Evaluation and Intervention:   Psychosocial Re-Evaluation: Psychosocial Re-Evaluation    [place] Name 01/08/19 1456             Psychosocial Re-Evaluation   Current issues with  None Identified       Interventions  Encouraged to attend Cardiac Rehabilitation for the exercise       Continue Psychosocial Services  No Follow up required          Psychosocial Discharge (Final Psychosocial Re-Evaluation): Psychosocial Re-Evaluation - 01/08/19 1456      Psychosocial Re-Evaluation   Current issues with  None Identified    Interventions  Encouraged to attend Cardiac Rehabilitation for the exercise    Continue Psychosocial Services   No Follow up required       Vocational Rehabilitation: Provide vocational rehab assistance to qualifying candidates.   Vocational Rehab Evaluation & Intervention: Vocational Rehab - 01/01/19 1025      Initial Vocational Rehab Evaluation & Intervention   Assessment shows need for Vocational Rehabilitation  No   Eric [name] is retired and does not need vocational rehab at this time      Education: Education Goals: Education classes will be provided on a weekly basis, covering required topics. Participant will state understanding/return demonstration of topics presented.  Learning Barriers/Preferences: Learning Barriers/Preferences - 01/01/19 1139      Learning Barriers/Preferences   Learning Barriers  Hearing    Learning Preferences  None       Education Topics: Hypertension, Hypertension Reduction -Define heart disease and high blood pressure. Discus how high blood pressure affects the body and ways to reduce high blood pressure.   Exercise and Your  Heart -Discuss why it is important to exercise, the FITT principles of exercise, normal and abnormal responses to exercise, and how to exercise safely.   Angina -Discuss definition of angina, causes of angina, treatment of angina, and how to decrease risk of having angina.   Cardiac Medications -Review what the following cardiac medications are used for, how they affect the body, and side effects that may occur when taking the medications.  Medications include Aspirin, Beta blockers, calcium channel blockers, ACE Inhibitors, angiotensin receptor blockers, diuretics, digoxin, and antihyperlipidemics.   Congestive Heart Failure -Discuss the definition of CHF, how to live with CHF, the signs and symptoms of CHF, and how keep track of weight and sodium intake.   Heart Disease and Intimacy -Discus the effect sexual activity has on the heart, how changes occur during intimacy as we age, and safety during sexual activity.   Smoking Cessation / COPD -Discuss different methods to quit smoking, the health benefits of quitting smoking, and the definition of COPD.   Nutrition I: Fats -Discuss the types of cholesterol, what cholesterol does to the heart, and how cholesterol levels can be controlled.   Nutrition II: Labels -Discuss the different components of food labels and how to read food label   Heart Parts/Heart Disease and PAD -Discuss the anatomy of the heart, the pathway of blood circulation through the heart, and these are affected by heart disease.   Stress I: Signs and Symptoms -Discuss the causes of stress, how stress may lead to anxiety and depression, and ways to limit stress.   Stress II: Relaxation -Discuss different types of relaxation techniques to limit stress.   Warning Signs of Stroke / TIA -Discuss definition of a stroke, what the signs and symptoms are of a stroke, and how to identify when someone is having stroke.   Knowledge Questionnaire Score: Knowledge  Questionnaire Score - 01/01/19 1006      Knowledge Questionnaire Score   Pre Score  18/24       Core Components/Risk Factors/Patient Goals at Admission: Personal Goals and Risk Factors at Admission - 01/01/19 1119      Core Components/Risk Factors/Patient Goals on Admission    Weight Management  Yes;Weight Maintenance  Admit Weight  181 lb 14.1 oz (82.5 kg)    Hypertension  Yes    Intervention  Provide education on lifestyle modifcations including regular physical activity/exercise, weight management, moderate sodium restriction and increased consumption of fresh fruit, vegetables, and low fat dairy, alcohol moderation, and smoking cessation.;Monitor prescription use compliance.    Expected Outcomes  Short Term: Continued assessment and intervention until BP is < 140/75mm HG in hypertensive participants. < 130/75mm HG in hypertensive participants with diabetes, heart failure or chronic kidney disease.;Long Term: Maintenance of blood pressure at goal levels.    Lipids  Yes    Intervention  Provide education and support for participant on nutrition & aerobic/resistive exercise along with prescribed medications to achieve LDL 70mg , HDL >40mg .    Expected Outcomes  Long Term: Cholesterol controlled with medications as prescribed, with individualized exercise RX and with personalized nutrition plan. Value goals: LDL < 70mg , HDL > 40 mg.;Short Term: Participant states understanding of desired cholesterol values and is compliant with medications prescribed. Participant is following exercise prescription and nutrition guidelines.       Core Components/Risk Factors/Patient Goals Review:  Goals and Risk Factor Review    Row Name 01/08/19 1456             Core Components/Risk Factors/Patient Goals Review   Personal Goals Review  Weight Management/Obesity;Hypertension;Lipids       Review  Eric [name] started exervcise at cardiac rehab this week. Eric [name]'s vital signs are stable Eric [name] is doing well with  exercise.       Expected Outcomes  Patient will participate in cardiac rehab for exercise, nutrtion and lifestyle modifications.          Core Components/Risk Factors/Patient Goals at Discharge (Final Review):  Goals and Risk Factor Review - 01/08/19 1456      Core Components/Risk Factors/Patient Goals Review   Personal Goals Review  Weight Management/Obesity;Hypertension;Lipids    Review  Eric [name] started exervcise at cardiac rehab this week. Eric [name]'s vital signs are stable Eric [name] is doing well with exercise.    Expected Outcomes  Patient will participate in cardiac rehab for exercise, nutrtion and lifestyle modifications.       ITP Comments: ITP Comments    Row Name 01/01/19 1005 01/08/19 1454         ITP Comments  Dr. Fransico Him, Medical Director  30 Day ITP Review. Eric [name] started exercise this week and is off to a good start to exercise.         Comments: See ITP comments.Barnet Pall, RN,BSN 01/08/2019 3:04 PM

## 2019-01-09 ENCOUNTER — Encounter (HOSPITAL_COMMUNITY)
Admission: RE | Admit: 2019-01-09 | Discharge: 2019-01-09 | Disposition: A | Discharge status: Home / Self Care | Payer: Medicare Other | Source: Ambulatory Visit | Attending: Cardiovascular Disease | Admitting: Cardiovascular Disease

## 2019-01-09 ENCOUNTER — Other Ambulatory Visit: Payer: Self-pay

## 2019-01-09 DIAGNOSIS — Z952 Presence of prosthetic heart valve: Secondary | ICD-10-CM

## 2019-01-12 ENCOUNTER — Encounter (HOSPITAL_COMMUNITY)
Admission: RE | Admit: 2019-01-12 | Discharge: 2019-01-12 | Disposition: A | Discharge status: Home / Self Care | Payer: Medicare Other | Source: Ambulatory Visit | Attending: Cardiovascular Disease | Admitting: Cardiovascular Disease

## 2019-01-12 ENCOUNTER — Other Ambulatory Visit: Payer: Self-pay

## 2019-01-12 DIAGNOSIS — Z952 Presence of prosthetic heart valve: Secondary | ICD-10-CM | POA: Diagnosis not present

## 2019-01-12 NOTE — Progress Notes (Signed)
I have reviewed a Home Exercise Prescription with [name] . Eric [name] is currently exercising at home.  The patient was advised to walk 5-7 days a week for 30-45 minutes.  Harmon and I discussed how to progress their exercise prescription.  The patient stated that their goals were to continue walking and using his stationary bike 30-45 minutes in addition to CR.  The patient stated that they understand the exercise prescription.  We reviewed exercise guidelines, target heart rate during exercise, RPE Scale, weather conditions, NTG use, endpoints for exercise, warmup and cool down.  Patient is encouraged to come to me with any questions. I will continue to follow up with the patient to assist them with progression and safety.    [name] BS, ACSM CEP 01/12/2019 3:08 PM

## 2019-01-14 ENCOUNTER — Other Ambulatory Visit: Payer: Self-pay

## 2019-01-14 ENCOUNTER — Encounter (HOSPITAL_COMMUNITY)
Admission: RE | Admit: 2019-01-14 | Discharge: 2019-01-14 | Disposition: A | Discharge status: Home / Self Care | Payer: Medicare Other | Source: Ambulatory Visit | Attending: Cardiovascular Disease | Admitting: Cardiovascular Disease

## 2019-01-14 DIAGNOSIS — Z952 Presence of prosthetic heart valve: Secondary | ICD-10-CM | POA: Diagnosis not present

## 2019-01-16 ENCOUNTER — Encounter (HOSPITAL_COMMUNITY)
Admission: RE | Admit: 2019-01-16 | Discharge: 2019-01-16 | Disposition: A | Discharge status: Home / Self Care | Payer: Medicare Other | Source: Ambulatory Visit | Attending: Cardiovascular Disease | Admitting: Cardiovascular Disease

## 2019-01-16 ENCOUNTER — Other Ambulatory Visit: Payer: Self-pay

## 2019-01-16 DIAGNOSIS — Z952 Presence of prosthetic heart valve: Secondary | ICD-10-CM | POA: Diagnosis not present

## 2019-01-19 ENCOUNTER — Other Ambulatory Visit: Payer: Self-pay

## 2019-01-19 ENCOUNTER — Encounter (HOSPITAL_COMMUNITY)
Admission: RE | Admit: 2019-01-19 | Discharge: 2019-01-19 | Disposition: A | Discharge status: Home / Self Care | Payer: Medicare Other | Source: Ambulatory Visit | Attending: Cardiovascular Disease | Admitting: Cardiovascular Disease

## 2019-01-19 DIAGNOSIS — Z952 Presence of prosthetic heart valve: Secondary | ICD-10-CM | POA: Diagnosis not present

## 2019-01-21 ENCOUNTER — Other Ambulatory Visit: Payer: Self-pay

## 2019-01-21 ENCOUNTER — Encounter (HOSPITAL_COMMUNITY)
Admission: RE | Admit: 2019-01-21 | Discharge: 2019-01-21 | Disposition: A | Discharge status: Home / Self Care | Payer: Medicare Other | Source: Ambulatory Visit | Attending: Cardiovascular Disease | Admitting: Cardiovascular Disease

## 2019-01-21 DIAGNOSIS — Z952 Presence of prosthetic heart valve: Secondary | ICD-10-CM | POA: Insufficient documentation

## 2019-01-23 ENCOUNTER — Other Ambulatory Visit: Payer: Self-pay

## 2019-01-23 ENCOUNTER — Encounter (HOSPITAL_COMMUNITY)
Admission: RE | Admit: 2019-01-23 | Discharge: 2019-01-23 | Disposition: A | Discharge status: Home / Self Care | Payer: Medicare Other | Source: Ambulatory Visit | Attending: Cardiovascular Disease | Admitting: Cardiovascular Disease

## 2019-01-23 DIAGNOSIS — Z952 Presence of prosthetic heart valve: Secondary | ICD-10-CM

## 2019-01-28 ENCOUNTER — Encounter (HOSPITAL_COMMUNITY)
Admission: RE | Admit: 2019-01-28 | Discharge: 2019-01-28 | Disposition: A | Discharge status: Home / Self Care | Payer: Medicare Other | Source: Ambulatory Visit | Attending: Cardiovascular Disease | Admitting: Cardiovascular Disease

## 2019-01-28 ENCOUNTER — Other Ambulatory Visit: Payer: Self-pay

## 2019-01-28 DIAGNOSIS — Z952 Presence of prosthetic heart valve: Secondary | ICD-10-CM

## 2019-01-30 ENCOUNTER — Other Ambulatory Visit: Payer: Self-pay

## 2019-01-30 ENCOUNTER — Encounter (HOSPITAL_COMMUNITY)
Admission: RE | Admit: 2019-01-30 | Discharge: 2019-01-30 | Disposition: A | Discharge status: Home / Self Care | Payer: Medicare Other | Source: Ambulatory Visit | Attending: Cardiovascular Disease | Admitting: Cardiovascular Disease

## 2019-01-30 DIAGNOSIS — Z952 Presence of prosthetic heart valve: Secondary | ICD-10-CM | POA: Diagnosis not present

## 2019-02-02 ENCOUNTER — Encounter (HOSPITAL_COMMUNITY)
Admission: RE | Admit: 2019-02-02 | Discharge: 2019-02-02 | Disposition: A | Discharge status: Home / Self Care | Payer: Medicare Other | Source: Ambulatory Visit | Attending: Cardiovascular Disease | Admitting: Cardiovascular Disease

## 2019-02-02 ENCOUNTER — Other Ambulatory Visit: Payer: Self-pay

## 2019-02-02 DIAGNOSIS — Z952 Presence of prosthetic heart valve: Secondary | ICD-10-CM

## 2019-02-04 ENCOUNTER — Encounter (HOSPITAL_COMMUNITY)
Admission: RE | Admit: 2019-02-04 | Discharge: 2019-02-04 | Disposition: A | Discharge status: Home / Self Care | Payer: Medicare Other | Source: Ambulatory Visit | Attending: Cardiovascular Disease | Admitting: Cardiovascular Disease

## 2019-02-04 ENCOUNTER — Other Ambulatory Visit: Payer: Self-pay

## 2019-02-04 DIAGNOSIS — Z952 Presence of prosthetic heart valve: Secondary | ICD-10-CM | POA: Diagnosis not present

## 2019-02-05 NOTE — Progress Notes (Signed)
Cardiac Individual Treatment Plan  Patient Details  Name: Eric [name] MRN: [id number] Date of Birth: [birth year] Referring Provider:     CARDIAC REHAB PHASE II ORIENTATION from 01/01/2019 in [facility]  Referring Provider  Dr. 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  amoxicillin (AMOXIL) 500 MG tablet, Take 4 tablets (2,000 mg total) by mouth as directed. Take 1 hour prior to dental work, including cleanings., Disp: 12 tablet, Rfl: 12 .  aspirin 81 MG chewable tablet, Chew 1 tablet (81 mg total) by mouth daily., Disp: 90 tablet, Rfl: 1 .  atorvastatin (LIPITOR) 40 MG tablet, TAKE 1 TABLET EVERY EVENING, Disp: 90 tablet, Rfl: 2 .  ELIQUIS 5 MG TABS tablet, TAKE 1 TABLET TWICE A DAY, Disp: 180 tablet, Rfl: 2 .  ezetimibe (ZETIA) 10 MG tablet, Take 1 tablet (10 mg total) by mouth every evening., Disp: 90 tablet, Rfl: 3 .  furosemide (LASIX) 20 MG tablet, Take 1 tablet (20 mg total) by mouth every other day. Pt may take additional doses if needed for leg swelling, Disp: 30 tablet, Rfl: 12 .  losartan (COZAAR) 25 MG tablet, Take 1 tablet (25 mg total) by mouth daily., Disp: 90 tablet, Rfl: 3 .  metoprolol tartrate (LOPRESSOR) 25 MG tablet, Take 0.5 tablets (12.5 mg total) by mouth 2 (two) times daily., Disp: 90 tablet, Rfl: 3  Past Medical History: Past Medical History:  Diagnosis Date  . Atrial flutter, paroxysmal ([place])    a. dx 04-02-2014, s/p  successful cardioversion 04-06-2014.  Marland Kitchen Chronic anticoagulation    on Eliquis--  due to recurrent dvt's and pe's  . Dyslipidemia   . 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 History of DVT (deep vein thrombosis)    2000-- RLE  . History of melanoma excision    left flank;  07/ 2014 left lower leg and right upper chest  . History of prostate cancer    Gleason 6--  s/p  radical prostatectomy 06/ 2000 in [place], [place]---  no recurrence  . History of pulmonary embolus (PE)    2000 & 2005  bilateral  . Hx of valvuloplasty 06/19/2007   a. s/p mitral ring annuloplasty, repair ruptured chordae of P2 flail segments of MV  . S/P aortic valve replacement with bioprosthetic valve    06-19-2007  severe aortic valve stenosis  . 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 Single vessel coronary artery disease   cardiologist-  dr Irish Lack   a. 05/2007 CABG x 1: s/p SVG-OM;  b. 10/2010 Ex MV: EF 72%, inf attenuation w/o ischemia, brief run of PAT with exercise. To  . Thrombocytopenia ([place])   . Thyroid goiter 2013   nodular  . Wears hearing aid    BILATERAL  . Wears partial dentures    UPPER    Tobacco Use: Social History   Tobacco Use  Smoking Status Never Smoker  Smokeless Tobacco Never Used    Labs: Recent Review Flowsheet Data    Labs for ITP Cardiac and Pulmonary Rehab Latest Ref Rng & Units 06/20/2007 10/29/2018 10/29/2018 11/20/2018 11/25/2018   Hemoglobin A1c  4.8 - 5.6 % - - - 5.4 -   PHART 7.350 - 7.450 7.446 - 7.383 7.479(H) -   PCO2ART 32.0 - 48.0 mmHg 30.7(L) - 39.3 32.8 -   HCO3 20.0 - 28.0 mmol/L 21.1 24.0 23.4 24.1 -   TCO2 22 - 32 mmol/L '22 25 25 ' - 27   ACIDBASEDEF 0.0 - 2.0 mmol/L 2.0 1.0 1.0 - -   O2SAT % - 71.0 97.0 99.3 -      Capillary Blood Glucose: No results found for: GLUCAP   Exercise Target Goals: Exercise Program Goal: Individual exercise prescription set using results from initial 6 min walk test and THRR while considering  patient's activity barriers and safety.   Exercise Prescription Goal: Starting with aerobic activity 30 plus minutes a day, 3 days per week for initial exercise prescription. Provide home exercise  prescription and guidelines that participant acknowledges understanding prior to discharge.  Activity Barriers & Risk Stratification: Activity Barriers & Cardiac Risk Stratification - 01/01/19 1113      Activity Barriers & Cardiac Risk Stratification   Activity Barriers  Deconditioning;History of Falls;Balance Concerns    Cardiac Risk Stratification  High       6 Minute Walk: 6 Minute Walk    Row Name 01/01/19 1112         6 Minute Walk   Phase  Initial     Distance  1182 feet     Walk Time  6 minutes     # of Rest Breaks  0     MPH  2.2     METS  1.81     RPE  12     Perceived Dyspnea   0     VO2 Peak  6.33     Symptoms  No     Resting HR  71 bpm     Resting BP  104/70     Resting Oxygen Saturation   97 %     Exercise Oxygen Saturation  during 6 min walk  98 %     Max Ex. HR  77 bpm     Max Ex. BP  142/72     2 Minute Post BP  108/70        Oxygen Initial Assessment:   Oxygen Re-Evaluation:   Oxygen Discharge (Final Oxygen Re-Evaluation):   Initial Exercise Prescription: Initial Exercise Prescription - 01/01/19 1100      Date of Initial Exercise RX and Referring Provider   Date  01/01/19    Referring Provider  Dr. [name]    Expected Discharge Date  02/13/19      Recumbant Bike   Level  1.5    Watts  5    Minutes  15    METs  2.2      NuStep   Level  2    SPM  75    Minutes  15    METs  1.9      Prescription Details   Frequency (times per week)  3    Duration  Progress to 30 minutes of continuous aerobic without signs/symptoms of physical distress      Intensity   THRR 40-80% of Max Heartrate  53-106    Ratings of Perceived Exertion  11-13      Progression   Progression  Continue to progress workloads to maintain intensity without signs/symptoms of physical distress.      Resistance Training   Training Prescription  Yes    Weight  3  lbs    Reps  10-15       Perform Capillary Blood Glucose checks as needed.  Exercise Prescription  Changes: Exercise Prescription Changes    Row Name 01/05/19 1325 01/12/19 1500 02/04/19 1315         Response to Exercise   Blood Pressure (Admit)  128/64  133/66  108/72     Blood Pressure (Exercise)  128/72  122/64  140/72     Blood Pressure (Exit)  118/82  122/68  104/70     Heart Rate (Admit)  71 bpm  96 bpm  85 bpm     Heart Rate (Exercise)  87 bpm  94 bpm  105 bpm     Heart Rate (Exit)  71 bpm  93 bpm  89 bpm     Rating of Perceived Exertion (Exercise)  '11  12  12     ' Symptoms  none  none  none     Duration  Progress to 30 minutes of  aerobic without signs/symptoms of physical distress  Progress to 30 minutes of  aerobic without signs/symptoms of physical distress  Progress to 30 minutes of  aerobic without signs/symptoms of physical distress     Intensity  THRR unchanged  THRR unchanged  THRR unchanged       Progression   Progression  Continue to progress workloads to maintain intensity without signs/symptoms of physical distress.  Continue to progress workloads to maintain intensity without signs/symptoms of physical distress.  Continue to progress workloads to maintain intensity without signs/symptoms of physical distress.     Average METs  2.4  3.3  3.5       Resistance Training   Training Prescription  Yes  Yes  No     Weight  3 lbs  5 lbs.   -     Reps  10-15  10-15  -     Time  10 Minutes  10 Minutes  -       Interval Training   Interval Training  -  No  -       Recumbant Bike   Level  1.'5  3  4     ' Watts  -  -  38     Minutes  '15  15  15     ' METs  -  3.9  3.89       NuStep   Level  '2  4  5     ' SPM  75  85  85     Minutes  '15  15  15     ' METs  2.4  2.8  3.2       Home Exercise Plan   Plans to continue exercise at  -  Home (comment)  Home (comment)     Frequency  -  Add 3 additional days to program exercise sessions.  Add 3 additional days to program exercise sessions.     Initial Home Exercises Provided  -  01/12/19  01/12/19        Exercise  Comments: Exercise Comments    Row Name 01/05/19 1421 01/12/19 1506 02/05/19 1438       Exercise Comments  Pt tolerated 1st session of exercise well without c/o.  Reviewed HEP with Pt. Pt was responsive and understands goals.  Pt is tolerating exercise Rx well and is progressing gradually.        Exercise Goals and Review: Exercise Goals    Row Name 01/01/19 1118  Exercise Goals   Increase Physical Activity  Yes       Intervention  Provide advice, education, support and counseling about physical activity/exercise needs.;Develop an individualized exercise prescription for aerobic and resistive training based on initial evaluation findings, risk stratification, comorbidities and participant's personal goals.       Expected Outcomes  Short Term: Attend rehab on a regular basis to increase amount of physical activity.;Long Term: Add in home exercise to make exercise part of routine and to increase amount of physical activity.;Long Term: Exercising regularly at least 3-5 days a week.       Increase Strength and Stamina  Yes       Intervention  Provide advice, education, support and counseling about physical activity/exercise needs.;Develop an individualized exercise prescription for aerobic and resistive training based on initial evaluation findings, risk stratification, comorbidities and participant's personal goals.       Expected Outcomes  Short Term: Increase workloads from initial exercise prescription for resistance, speed, and METs.;Short Term: Perform resistance training exercises routinely during rehab and add in resistance training at home;Long Term: Improve cardiorespiratory fitness, muscular endurance and strength as measured by increased METs and functional capacity (6MWT)       Able to understand and use rate of perceived exertion (RPE) scale  Yes       Intervention  Provide education and explanation on how to use RPE scale       Expected Outcomes  Short Term: Able to  use RPE daily in rehab to express subjective intensity level;Long Term:  Able to use RPE to guide intensity level when exercising independently       Knowledge and understanding of Target Heart Rate Range (THRR)  Yes       Intervention  Provide education and explanation of THRR including how the numbers were predicted and where they are located for reference       Expected Outcomes  Short Term: Able to state/look up THRR;Long Term: Able to use THRR to govern intensity when exercising independently;Short Term: Able to use daily as guideline for intensity in rehab       Able to check pulse independently  Yes       Intervention  Provide education and demonstration on how to check pulse in carotid and radial arteries.;Review the importance of being able to check your own pulse for safety during independent exercise       Expected Outcomes  Short Term: Able to explain why pulse checking is important during independent exercise;Long Term: Able to check pulse independently and accurately       Understanding of Exercise Prescription  Yes       Intervention  Provide education, explanation, and written materials on patient's individual exercise prescription       Expected Outcomes  Short Term: Able to explain program exercise prescription;Long Term: Able to explain home exercise prescription to exercise independently          Exercise Goals Re-Evaluation : Exercise Goals Re-Evaluation    Row Name 01/05/19 1421 01/12/19 1504 02/05/19 1437         Exercise Goal Re-Evaluation   Exercise Goals Review  Increase Physical Activity;Able to understand and use rate of perceived exertion (RPE) scale  Increase Physical Activity;Understanding of Exercise Prescription;Increase Strength and Stamina;Knowledge and understanding of Target Heart Rate Range (THRR);Able to understand and use rate of perceived exertion (RPE) scale;Able to check pulse independently  Increase Physical Activity;Increase Strength and Stamina;Able  to understand and use rate of  perceived exertion (RPE) scale;Knowledge and understanding of Target Heart Rate Range (THRR);Able to check pulse independently;Understanding of Exercise Prescription     Comments  Pt able to understand and use RPE scale appropriately.  Reviewed HEP with Pt. Pt understands THRR, RPE scale, end points of exercise, weather precautions, and warm up and cool down exercises. Pt is currently using a stationary bike and wlaking for exercise 30-45 minutes per day in addition to CR.  Pt is tolerating exercise well and increasing workloads gradually. Pt MET level continues to increase and is a 3.5. Pt continues to exercise at home in addition to CR program.     Expected Outcomes  Increase workloads as tolerated to help improve cardiorespiratory fitness.  Will continue to monitor and progress Pt as tolerated.  Will continue to monitor and progress Pt as tolerated.         Discharge Exercise Prescription (Final Exercise Prescription Changes): Exercise Prescription Changes - 02/04/19 1315      Response to Exercise   Blood Pressure (Admit)  108/72    Blood Pressure (Exercise)  140/72    Blood Pressure (Exit)  104/70    Heart Rate (Admit)  85 bpm    Heart Rate (Exercise)  105 bpm    Heart Rate (Exit)  89 bpm    Rating of Perceived Exertion (Exercise)  12    Symptoms  none    Duration  Progress to 30 minutes of  aerobic without signs/symptoms of physical distress    Intensity  THRR unchanged      Progression   Progression  Continue to progress workloads to maintain intensity without signs/symptoms of physical distress.    Average METs  3.5      Resistance Training   Training Prescription  No      Recumbant Bike   Level  4    Watts  38    Minutes  15    METs  3.89      NuStep   Level  5    SPM  85    Minutes  15    METs  3.2      Home Exercise Plan   Plans to continue exercise at  Home (comment)    Frequency  Add 3 additional days to program exercise sessions.     Initial Home Exercises Provided  01/12/19       Nutrition:  Target Goals: Understanding of nutrition guidelines, daily intake of sodium <1584m, cholesterol <2078m calories 30% from fat and 7% or less from saturated fats, daily to have 5 or more servings of fruits and vegetables.  Biometrics: Pre Biometrics - 01/01/19 1118      Pre Biometrics   Height  '5\' 11"'  (1.803 m)    Weight  82.5 kg    Waist Circumference  37 inches    Hip Circumference  38.5 inches    Waist to Hip Ratio  0.96 %    BMI (Calculated)  25.38    Triceps Skinfold  18 mm    % Body Fat  26.4 %    Grip Strength  36 kg    Flexibility  0 in    Single Leg Stand  1.12 seconds        Nutrition Therapy Plan and Nutrition Goals:   Nutrition Assessments:   Nutrition Goals Re-Evaluation:   Nutrition Goals Discharge (Final Nutrition Goals Re-Evaluation):   Psychosocial: Target Goals: Acknowledge presence or absence of significant depression and/or stress, maximize coping skills, provide positive  support system. Participant is able to verbalize types and ability to use techniques and skills needed for reducing stress and depression.  Initial Review & Psychosocial Screening: Initial Psych Review & Screening - 01/01/19 0939      Initial Review   Current issues with  None Identified      Family Dynamics   Good Support System?  Yes   [name] has his wife for support     Barriers   Psychosocial barriers to participate in program  There are no identifiable barriers or psychosocial needs.      Screening Interventions   Interventions  Encouraged to exercise       Quality of Life Scores: Quality of Life - 01/01/19 1005      Quality of Life   Select  Quality of Life      Quality of Life Scores   Health/Function Pre  27.9 %    Socioeconomic Pre  30 %    Psych/Spiritual Pre  29.14 %    Family Pre  30 %    GLOBAL Pre  28.9 %      Scores of 19 and below usually indicate a poorer quality of life in these  areas.  A difference of  2-3 points is a clinically meaningful difference.  A difference of 2-3 points in the total score of the Quality of Life Index has been associated with significant improvement in overall quality of life, self-image, physical symptoms, and general health in studies assessing change in quality of life.  PHQ-9: Recent Review Flowsheet Data    Depression screen [facility] 2/9 01/01/2019   Decreased Interest 0   Down, Depressed, Hopeless 0   PHQ - 2 Score 0     Interpretation of Total Score  Total Score Depression Severity:  1-4 = Minimal depression, 5-9 = Mild depression, 10-14 = Moderate depression, 15-19 = Moderately severe depression, 20-27 = Severe depression   Psychosocial Evaluation and Intervention:   Psychosocial Re-Evaluation: Psychosocial Re-Evaluation    [place] Name 01/08/19 1456 02/05/19 1504           Psychosocial Re-Evaluation   Current issues with  None Identified  None Identified      Interventions  Encouraged to attend Cardiac Rehabilitation for the exercise  Encouraged to attend Cardiac Rehabilitation for the exercise      Continue Psychosocial Services   No Follow up required  -         Psychosocial Discharge (Final Psychosocial Re-Evaluation): Psychosocial Re-Evaluation - 02/05/19 1504      Psychosocial Re-Evaluation   Current issues with  None Identified    Interventions  Encouraged to attend Cardiac Rehabilitation for the exercise       Vocational Rehabilitation: Provide vocational rehab assistance to qualifying candidates.   Vocational Rehab Evaluation & Intervention: Vocational Rehab - 01/01/19 1025      Initial Vocational Rehab Evaluation & Intervention   Assessment shows need for Vocational Rehabilitation  No   [name] is retired and does not need vocational rehab at this time      Education: Education Goals: Education classes will be provided on a weekly basis, covering required topics. Participant will state understanding/return  demonstration of topics presented.  Learning Barriers/Preferences: Learning Barriers/Preferences - 01/01/19 1139      Learning Barriers/Preferences   Learning Barriers  Hearing    Learning Preferences  None       Education Topics: Hypertension, Hypertension Reduction -Define heart disease and high blood pressure. Discus how high blood pressure  affects the body and ways to reduce high blood pressure.   Exercise and Your Heart -Discuss why it is important to exercise, the FITT principles of exercise, normal and abnormal responses to exercise, and how to exercise safely.   Angina -Discuss definition of angina, causes of angina, treatment of angina, and how to decrease risk of having angina.   Cardiac Medications -Review what the following cardiac medications are used for, how they affect the body, and side effects that may occur when taking the medications.  Medications include Aspirin, Beta blockers, calcium channel blockers, ACE Inhibitors, angiotensin receptor blockers, diuretics, digoxin, and antihyperlipidemics.   Congestive Heart Failure -Discuss the definition of CHF, how to live with CHF, the signs and symptoms of CHF, and how keep track of weight and sodium intake.   Heart Disease and Intimacy -Discus the effect sexual activity has on the heart, how changes occur during intimacy as we age, and safety during sexual activity.   Smoking Cessation / COPD -Discuss different methods to quit smoking, the health benefits of quitting smoking, and the definition of COPD.   Nutrition I: Fats -Discuss the types of cholesterol, what cholesterol does to the heart, and how cholesterol levels can be controlled.   Nutrition II: Labels -Discuss the different components of food labels and how to read food label   Heart Parts/Heart Disease and PAD -Discuss the anatomy of the heart, the pathway of blood circulation through the heart, and these are affected by heart  disease.   Stress I: Signs and Symptoms -Discuss the causes of stress, how stress may lead to anxiety and depression, and ways to limit stress.   Stress II: Relaxation -Discuss different types of relaxation techniques to limit stress.   Warning Signs of Stroke / TIA -Discuss definition of a stroke, what the signs and symptoms are of a stroke, and how to identify when someone is having stroke.   Knowledge Questionnaire Score: Knowledge Questionnaire Score - 01/01/19 1006      Knowledge Questionnaire Score   Pre Score  18/24       Core Components/Risk Factors/Patient Goals at Admission: Personal Goals and Risk Factors at Admission - 01/01/19 1119      Core Components/Risk Factors/Patient Goals on Admission    Weight Management  Yes;Weight Maintenance    Admit Weight  181 lb 14.1 oz (82.5 kg)    Hypertension  Yes    Intervention  Provide education on lifestyle modifcations including regular physical activity/exercise, weight management, moderate sodium restriction and increased consumption of fresh fruit, vegetables, and low fat dairy, alcohol moderation, and smoking cessation.;Monitor prescription use compliance.    Expected Outcomes  Short Term: Continued assessment and intervention until BP is < 140/31m HG in hypertensive participants. < 130/866mHG in hypertensive participants with diabetes, heart failure or chronic kidney disease.;Long Term: Maintenance of blood pressure at goal levels.    Lipids  Yes    Intervention  Provide education and support for participant on nutrition & aerobic/resistive exercise along with prescribed medications to achieve LDL <7047mHDL >35m38m  Expected Outcomes  Long Term: Cholesterol controlled with medications as prescribed, with individualized exercise RX and with personalized nutrition plan. Value goals: LDL < 70mg48mL > 40 mg.;Short Term: Participant states understanding of desired cholesterol values and is compliant with medications  prescribed. Participant is following exercise prescription and nutrition guidelines.       Core Components/Risk Factors/Patient Goals Review:  Goals and Risk Factor Review    Row  Name 01/08/19 1456 02/05/19 1504           Core Components/Risk Factors/Patient Goals Review   Personal Goals Review  Weight Management/Obesity;Hypertension;Lipids  Weight Management/Obesity;Hypertension;Lipids      Review  [name] started exervcise at cardiac rehab this week. Bob's vital signs are stable [name] is doing well with exercise.  [name] started exervcise at cardiac rehab this week. Bob's vital signs are stable [name] is doing well with exercise.      Expected Outcomes  Patient will participate in cardiac rehab for exercise, nutrtion and lifestyle modifications.  Patient will participate in cardiac rehab for exercise, nutrtion and lifestyle modifications.         Core Components/Risk Factors/Patient Goals at Discharge (Final Review):  Goals and Risk Factor Review - 02/05/19 1504      Core Components/Risk Factors/Patient Goals Review   Personal Goals Review  Weight Management/Obesity;Hypertension;Lipids    Review  [name] started exervcise at cardiac rehab this week. Bob's vital signs are stable [name] is doing well with exercise.    Expected Outcomes  Patient will participate in cardiac rehab for exercise, nutrtion and lifestyle modifications.       ITP Comments: ITP Comments    Row Name 01/01/19 1005 01/08/19 1454 02/05/19 1457       ITP Comments  Dr. Fransico Him, Medical Director  30 Day ITP Review. [name] started exercise this week and is off to a good start to exercise.  30 Day ITP Review. patient with good attenndance and participation in phase 2 cardiac rehab. [name] will be out of town until October 2nd        Comments: See ITP comments.Barnet Pall, RN,BSN 02/05/2019 3:14 PM

## 2019-02-06 ENCOUNTER — Encounter (HOSPITAL_COMMUNITY): Payer: Medicare Other

## 2019-02-09 ENCOUNTER — Encounter (HOSPITAL_COMMUNITY): Payer: Medicare Other

## 2019-02-11 ENCOUNTER — Encounter (HOSPITAL_COMMUNITY): Payer: Medicare Other

## 2019-02-13 ENCOUNTER — Encounter (HOSPITAL_COMMUNITY): Payer: Medicare Other

## 2019-02-20 ENCOUNTER — Encounter (HOSPITAL_COMMUNITY): Payer: Medicare Other

## 2019-02-23 ENCOUNTER — Encounter (HOSPITAL_COMMUNITY)
Admission: RE | Admit: 2019-02-23 | Discharge: 2019-02-23 | Disposition: A | Discharge status: Home / Self Care | Payer: Medicare Other | Source: Ambulatory Visit | Attending: Cardiovascular Disease | Admitting: Cardiovascular Disease

## 2019-02-23 ENCOUNTER — Other Ambulatory Visit: Payer: Self-pay

## 2019-02-23 DIAGNOSIS — Z952 Presence of prosthetic heart valve: Secondary | ICD-10-CM

## 2019-02-25 ENCOUNTER — Encounter (HOSPITAL_COMMUNITY)
Admission: RE | Admit: 2019-02-25 | Discharge: 2019-02-25 | Disposition: A | Discharge status: Home / Self Care | Payer: Medicare Other | Source: Ambulatory Visit | Attending: Cardiovascular Disease | Admitting: Cardiovascular Disease

## 2019-02-25 ENCOUNTER — Other Ambulatory Visit: Payer: Self-pay

## 2019-02-25 VITALS — Ht 71.0 in | Wt 185.0 lb

## 2019-02-25 DIAGNOSIS — Z952 Presence of prosthetic heart valve: Secondary | ICD-10-CM

## 2019-02-27 ENCOUNTER — Other Ambulatory Visit: Payer: Self-pay

## 2019-02-27 ENCOUNTER — Encounter (HOSPITAL_COMMUNITY)
Admission: RE | Admit: 2019-02-27 | Discharge: 2019-02-27 | Disposition: A | Discharge status: Home / Self Care | Payer: Medicare Other | Source: Ambulatory Visit | Attending: Cardiovascular Disease | Admitting: Cardiovascular Disease

## 2019-02-27 DIAGNOSIS — Z952 Presence of prosthetic heart valve: Secondary | ICD-10-CM

## 2019-02-27 NOTE — Progress Notes (Signed)
Discharge Progress Report  Patient Details  Name: Eric [name] MRN: [id number] Date of Birth: March 18, 1930 Referring Provider:     [place] from 01/01/2019 in [place]  Referring Provider  Dr. [name]       Number of Visits: 16  Reason for Discharge:  Patient reached a stable level of exercise. Patient independent in their exercise. Patient has met program and personal goals.  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 [name]    Expected Discharge Date  02/13/19      Recumbant Bike   Level  1.5    Watts  5    Minutes  15    METs  2.2      NuStep   Level  2    SPM  75    Minutes  15    METs  1.9      Prescription Details   Frequency (times per week)  3    Duration  Progress to 30 minutes of continuous aerobic without signs/symptoms of physical distress      Intensity   THRR 40-80% of Max Heartrate  53-106    Ratings of Perceived Exertion  11-13      Progression   Progression  Continue to progress workloads to maintain intensity without signs/symptoms of physical distress.      Resistance Training   Training Prescription  Yes    Weight  3 lbs    Reps  10-15       Discharge Exercise Prescription (Final Exercise Prescription Changes): Exercise Prescription Changes - 02/27/19 1500      Response to Exercise   Blood Pressure (Admit)  132/60    Blood Pressure (Exercise)  132/60    Blood Pressure (Exit)  100/56    Heart Rate (Admit)  87 bpm    Heart Rate (Exercise)  103 bpm    Heart Rate (Exit)  87 bpm    Rating of Perceived Exertion (Exercise)  12    Symptoms  none     Comments  Pt graduated CR program.     Duration  Progress to 30 minutes of  aerobic without signs/symptoms of physical distress    Intensity  THRR unchanged      Progression   Progression  Continue to progress workloads to maintain intensity without signs/symptoms of physical distress.    Average METs  4.2      Resistance Training   Training Prescription  Yes    Weight  5 lbs.     Reps  10-15    Time  10 Minutes      Interval Training   Interval Training  No      Recumbant Bike   Level  4    Watts  38    Minutes  15    METs  5.3      NuStep   Level  5    SPM  85    Minutes  15    METs  3      Home Exercise Plan   Plans to continue exercise at  Home (  comment)    Frequency  Add 3 additional days to program exercise sessions.    Initial Home Exercises Provided  01/12/19       Functional Capacity: 6 Minute Walk    Row Name 01/01/19 1112 02/25/19 1544       6 Minute Walk   Phase  Initial  Discharge    Distance  1182 feet  1370 feet    Distance % Change  -  15.91 %    Distance Feet Change  -  188 ft    Walk Time  6 minutes  6 minutes    # of Rest Breaks  0  0    MPH  2.2  2.5    METS  1.81  2.3    RPE  12  12    Perceived Dyspnea   0  0    VO2 Peak  6.33  8    Symptoms  No  No    Resting HR  71 bpm  82 bpm    Resting BP  104/70  106/76    Resting Oxygen Saturation   97 %  -    Exercise Oxygen Saturation  during 6 min walk  98 %  -    Max Ex. HR  77 bpm  97 bpm    Max Ex. BP  142/72  140/72    2 Minute Post BP  108/70  120/62       Psychological, QOL, Others - Outcomes: PHQ 2/9: Depression screen [facility] 2/9 02/27/2019 01/01/2019  Decreased Interest 0 0  Down, Depressed, Hopeless 0 0  PHQ - 2 Score 0 0    Quality of Life: Quality of Life - 02/27/19 1527      Quality of Life   Select  Quality of Life      Quality of Life Scores   Health/Function Post  27.23 %    Socioeconomic Post  27.33 %    Psych/Spiritual Post  29.14 %    Family Post  30 %     GLOBAL Post  28.08 %       Personal Goals: Goals established at orientation with interventions provided to work toward goal. Personal Goals and Risk Factors at Admission - 01/01/19 1119      Core Components/Risk Factors/Patient Goals on Admission    Weight Management  Yes;Weight Maintenance    Admit Weight  181 lb 14.1 oz (82.5 kg)    Hypertension  Yes    Intervention  Provide education on lifestyle modifcations including regular physical activity/exercise, weight management, moderate sodium restriction and increased consumption of fresh fruit, vegetables, and low fat dairy, alcohol moderation, and smoking cessation.;Monitor prescription use compliance.    Expected Outcomes  Short Term: Continued assessment and intervention until BP is < 140/69m HG in hypertensive participants. < 130/864mHG in hypertensive participants with diabetes, heart failure or chronic kidney disease.;Long Term: Maintenance of blood pressure at goal levels.    Lipids  Yes    Intervention  Provide education and support for participant on nutrition & aerobic/resistive exercise along with prescribed medications to achieve LDL <7061mHDL >4m34m  Expected Outcomes  Long Term: Cholesterol controlled with medications as prescribed, with individualized exercise RX and with personalized nutrition plan. Value goals: LDL < 70mg46mL > 40 mg.;Short Term: Participant states understanding of desired cholesterol values and is compliant with medications prescribed. Participant is following exercise prescription and nutrition guidelines.        Personal Goals  Discharge: Goals and Risk Factor Review    Row Name 01/08/19 1456 02/05/19 1504 02/27/19 1600         Core Components/Risk Factors/Patient Goals Review   Personal Goals Review  Weight Management/Obesity;Hypertension;Lipids  Weight Management/Obesity;Hypertension;Lipids  Weight Management/Obesity;Hypertension;Lipids     Review  [name] started exervcise at cardiac rehab this  week. Bob's vital signs are stable [name] is doing well with exercise.  [name] started exervcise at cardiac rehab this week. Bob's vital signs are stable [name] is doing well with exercise.  [name] completed CR today after 16 exercise sessions. Vitals remained stable and he tolerated workload increases. [name] plans to continues his exercise routein at home by riding his recumbant bike.     Expected Outcomes  Patient will participate in cardiac rehab for exercise, nutrtion and lifestyle modifications.  Patient will participate in cardiac rehab for exercise, nutrtion and lifestyle modifications.  Patient will continue to exercise at home and continue with lifestyle modification to decrease and manage his CAD risk factors        Exercise Goals and Review: Exercise Goals    Row Name 01/01/19 1118             Exercise Goals   Increase Physical Activity  Yes       Intervention  Provide advice, education, support and counseling about physical activity/exercise needs.;Develop an individualized exercise prescription for aerobic and resistive training based on initial evaluation findings, risk stratification, comorbidities and participant's personal goals.       Expected Outcomes  Short Term: Attend rehab on a regular basis to increase amount of physical activity.;Long Term: Add in home exercise to make exercise part of routine and to increase amount of physical activity.;Long Term: Exercising regularly at least 3-5 days a week.       Increase Strength and Stamina  Yes       Intervention  Provide advice, education, support and counseling about physical activity/exercise needs.;Develop an individualized exercise prescription for aerobic and resistive training based on initial evaluation findings, risk stratification, comorbidities and participant's personal goals.       Expected Outcomes  Short Term: Increase workloads from initial exercise prescription for resistance, speed, and METs.;Short Term: Perform resistance  training exercises routinely during rehab and add in resistance training at home;Long Term: Improve cardiorespiratory fitness, muscular endurance and strength as measured by increased METs and functional capacity (6MWT)       Able to understand and use rate of perceived exertion (RPE) scale  Yes       Intervention  Provide education and explanation on how to use RPE scale       Expected Outcomes  Short Term: Able to use RPE daily in rehab to express subjective intensity level;Long Term:  Able to use RPE to guide intensity level when exercising independently       Knowledge and understanding of Target Heart Rate Range (THRR)  Yes       Intervention  Provide education and explanation of THRR including how the numbers were predicted and where they are located for reference       Expected Outcomes  Short Term: Able to state/look up THRR;Long Term: Able to use THRR to govern intensity when exercising independently;Short Term: Able to use daily as guideline for intensity in rehab       Able to check pulse independently  Yes       Intervention  Provide education and demonstration on how to check pulse in carotid and radial arteries.;Review  the importance of being able to check your own pulse for safety during independent exercise       Expected Outcomes  Short Term: Able to explain why pulse checking is important during independent exercise;Long Term: Able to check pulse independently and accurately       Understanding of Exercise Prescription  Yes       Intervention  Provide education, explanation, and written materials on patient's individual exercise prescription       Expected Outcomes  Short Term: Able to explain program exercise prescription;Long Term: Able to explain home exercise prescription to exercise independently          Exercise Goals Re-Evaluation: Exercise Goals Re-Evaluation    Row Name 01/05/19 1421 01/12/19 1504 02/05/19 1437 02/27/19 1530       Exercise Goal Re-Evaluation    Exercise Goals Review  Increase Physical Activity;Able to understand and use rate of perceived exertion (RPE) scale  Increase Physical Activity;Understanding of Exercise Prescription;Increase Strength and Stamina;Knowledge and understanding of Target Heart Rate Range (THRR);Able to understand and use rate of perceived exertion (RPE) scale;Able to check pulse independently  Increase Physical Activity;Increase Strength and Stamina;Able to understand and use rate of perceived exertion (RPE) scale;Knowledge and understanding of Target Heart Rate Range (THRR);Able to check pulse independently;Understanding of Exercise Prescription  Increase Physical Activity;Understanding of Exercise Prescription;Increase Strength and Stamina;Knowledge and understanding of Target Heart Rate Range (THRR);Able to understand and use rate of perceived exertion (RPE) scale;Able to check pulse independently    Comments  Pt able to understand and use RPE scale appropriately.  Reviewed HEP with Pt. Pt understands THRR, RPE scale, end points of exercise, weather precautions, and warm up and cool down exercises. Pt is currently using a stationary bike and wlaking for exercise 30-45 minutes per day in addition to CR.  Pt is tolerating exercise well and increasing workloads gradually. Pt MET level continues to increase and is a 3.5. Pt continues to exercise at home in addition to CR program.  Pt graduated CR program today. Pt progressed through the program well and ended with a MET level of 4.2. Pt increased MET level and workloads gradually. Pt stated he plans to continue exercising at home by walking, rec. bike, and weights 5-7 days per week for 30-45 minutes.    Expected Outcomes  Increase workloads as tolerated to help improve cardiorespiratory fitness.  Will continue to monitor and progress Pt as tolerated.  Will continue to monitor and progress Pt as tolerated.  Pt will continue exercising at home.       Nutrition & Weight -  Outcomes: Pre Biometrics - 01/01/19 1118      Pre Biometrics   Height  _0  (1.803 m)    Weight  82.5 kg    Waist Circumference  37 inches    Hip Circumference  38.5 inches    Waist to Hip Ratio  0.96 %    BMI (Calculated)  25.38    Triceps Skinfold  18 mm    % Body Fat  26.4 %    Grip Strength  36 kg    Flexibility  0 in    Single Leg Stand  1.12 seconds      Post Biometrics - 02/25/19 1545       Post  Biometrics   Height  _1  (1.803 m)    Weight  83.9 kg    Waist Circumference  37 inches    Hip Circumference  38.5 inches  Waist to Hip Ratio  0.96 %    BMI (Calculated)  25.81    Triceps Skinfold  17 mm    % Body Fat  26.3 %    Grip Strength  38 kg    Flexibility  0 in    Single Leg Stand  2.62 seconds       Nutrition:   Nutrition Discharge:   Education Questionnaire Score: Knowledge Questionnaire Score - 02/25/19 1547      Knowledge Questionnaire Score   Post Score  20/24      Goals review with patient and copy given. Pt graduated from cardiac rehab program today with completion of 16 exercise sessions in Phase II. Pt maintained good attendance and progressed nicely during his participation in rehab as evidenced by increased MET level.   Medication list reconciled. Repeat  PHQ score-0  .  Pt has made significant lifestyle changes and should be commended for his success. Pt feels he has achieved his goals during cardiac rehab.

## 2019-03-11 ENCOUNTER — Telehealth: Payer: Self-pay | Admitting: Interventional Cardiology

## 2019-03-11 MED ORDER — AMOXICILLIN 500 MG PO TABS: 2000.0000 mg | ORAL_TABLET | ORAL | 6 refills | Status: DC

## 2019-03-11 NOTE — Telephone Encounter (Signed)
Continue Eliquis for single crown. Pt requires pre op abx due to TAVR, rx for amoxicillin sent to local [place].

## 2019-03-11 NOTE — Telephone Encounter (Signed)
New message   1. What dental office are you calling from? [facility]  2. What is your office phone number?[phone]  3. What is your fax number?[phone]  4. What type of procedure is the patient having performed? Crown on tooth #20  5. What date is procedure scheduled or is the patient there now? 03/19/2019 (if the patient is at the dentist's office question goes to their cardiologist if he/she is in the office.  If not, question should go to the DOD).   6. What is your question (ex. Antibiotics prior to procedure, holding medication-we need to know how long dentist wants pt to hold med)?Per Shirlean Mylar may need to withhold eliquis will leave up to doctor the amount of time to be withheld.

## 2019-03-11 NOTE — Telephone Encounter (Signed)
   Primary Cardiologist: [name], MD  Chart reviewed as part of pre-operative protocol coverage. Given past medical history and time since last visit, based on ACC/AHA guidelines, XAVIUS SANGHA would be at acceptable risk for the planned procedure without further cardiovascular testing.   Per pharmacy, pt will need to continue Eliquis for single crown. Pt does require pre-op abx due to TAVR. Prescription for amoxicillin sent to local [facility] pharmacy.  I will route this recommendation to the requesting party via Epic fax function and remove from pre-op pool.  Please call with questions.  Kathyrn Drown, NP 03/11/2019, 10:15 AM

## 2019-03-16 ENCOUNTER — Encounter: Payer: Self-pay | Admitting: Physical Therapy

## 2019-03-16 ENCOUNTER — Ambulatory Visit: Payer: Medicare Other | Attending: Internal Medicine | Admitting: Physical Therapy

## 2019-03-16 ENCOUNTER — Other Ambulatory Visit: Payer: Self-pay

## 2019-03-16 DIAGNOSIS — R2689 Other abnormalities of gait and mobility: Secondary | ICD-10-CM

## 2019-03-16 DIAGNOSIS — R262 Difficulty in walking, not elsewhere classified: Secondary | ICD-10-CM

## 2019-03-16 NOTE — Patient Instructions (Signed)
Access Code: [email or web address]  URL: [email or web address]  Date: 03/16/2019  Prepared by: Elsie Ra   Exercises  Backward Walking with Counter Support - 3-5 reps - 1 sets - 2x daily - 6x weekly  Standing Tandem Balance with Counter Support - 3 reps - 1 sets - 30 hold - 2x daily - 6x weekly  Standing Single Leg Stance with Counter Support - 5 reps - 1 sets - 2x daily - 6x weekly  Alternating Step Taps with Counter Support - 10 reps - 1-2 sets - 2x daily - 6x weekly  Walking March - 10 reps - 1-2 sets - 2x daily - 6x weekly  Heel Toe Raises with Unilateral Counter Support - 10 reps - 1-2 sets - 2x daily - 6x weekly

## 2019-03-16 NOTE — Therapy (Addendum)
[facility] [address] [place], [name], [address] Phone: [phone]   Fax:  [phone]  Physical Therapy Evaluation  Patient Details  Name: Eric [name] MRN: [id number] Date of Birth: 12/25/1929 Referring Provider (PT): [name], MD (PCP)   Encounter Date: 03/16/2019  PT End of Session - 03/16/19 0855    Visit Number  1    Number of Visits  12    Date for PT Re-Evaluation  04/27/19    Authorization Type  [employer] MCR    PT Start Time  0805    PT Stop Time  0850    PT Time Calculation (min)  45 min    Equipment Utilized During Treatment  Gait belt    Activity Tolerance  Patient tolerated treatment well       Past Medical History:  Diagnosis Date  . Atrial flutter, paroxysmal ([place])    a. dx 04-02-2014, s/p  successful cardioversion 04-06-2014.  Marland Kitchen Chronic anticoagulation    on Eliquis--  due to recurrent dvt's and pe's  . Dyslipidemia   . History of bladder cancer urologist-  dr [name]   02-13-2016  s/p TURBT per path high grade papillary urothelial carcinoma  . History of DVT (deep vein thrombosis)    2000-- RLE  . History of melanoma excision    left flank;  07/ 2014 left lower leg and right upper chest  . History of prostate cancer    Gleason 6--  s/p  radical prostatectomy 06/ 2000 in [place]---  no recurrence  . History of pulmonary embolus (PE)    2000 & 2005  bilateral  . Hx of valvuloplasty 06/19/2007   a. s/p mitral ring annuloplasty, repair ruptured chordae of P2 flail segments of MV  . S/P aortic valve replacement with bioprosthetic valve    06-19-2007  severe aortic valve stenosis  . 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 Single vessel coronary artery disease   cardiologist-  dr Irish Lack   a. 05/2007 CABG x 1: s/p SVG-OM;  b. 10/2010 Ex MV: EF 72%, inf attenuation w/o ischemia, brief run of PAT with exercise. To  . Thrombocytopenia ([place])   . Thyroid goiter 2013   nodular  . Wears  hearing aid    BILATERAL  . Wears partial dentures    UPPER    Past Surgical History:  Procedure Laterality Date  . AORTIC VALVE REPLACEMENT (AVR)/CORONARY ARTERY BYPASS GRAFTING (CABG)  06-19-2007  dr [name]   SVG to OM1 ;   AVR w/ #23 Mitralflow pericardial and ligation left atrial appendage;  MV repair w/ #28 Sorin 3-D memo Ring Annuloplasty with repair ruptured chordar of P-2 flail segments  . CARDIAC CATHETERIZATION  05-12-2007  dr [place] Reeves   single vessel 30% ostial LAD,  80% LCx;  severe to critial AS,  moderate MR,  normal LVF, ef 70%,  normal right heart pressures and cardiac outputs,  mild elevated LV end-diastolic pressure    . CARDIOVASCULAR STRESS TEST  11-02-2010  dr Irish Lack   normal nuclear study w/ no ischemia or infarct/scar/  normal LV function and wall motion , ef 72%  . CARDIOVERSION N/A 04/06/2014   Procedure: CARDIOVERSION;  Surgeon: [name], MD;  Location: [facility];  Service: Cardiovascular;  Laterality: N/A;   successful  . CATARACT EXTRACTION W/ INTRAOCULAR LENS  IMPLANT, BILATERAL  2012  approx  . CORONARY ARTERY BYPASS GRAFT    . RETROPUBIC RADICAL PROSTATECTOMY  06/ 2000  in [place]  . RIGHT/LEFT HEART CATH AND CORONARY/GRAFT ANGIOGRAPHY N/A 10/29/2018   Procedure: RIGHT/LEFT HEART CATH AND CORONARY/GRAFT ANGIOGRAPHY;  Surgeon: [name], MD;  Location: [facility];  Service: Cardiovascular;  Laterality: N/A;  . ROTATOR CUFF REPAIR Left 09/1998  . TEE WITHOUT CARDIOVERSION N/A 04/06/2014   Procedure: TRANSESOPHAGEAL ECHOCARDIOGRAM (TEE);  Surgeon: [name], MD;  Location: [facility] ENDOSCOPY;  Service: Cardiovascular;  Laterality: N/A;  mild focal basa LVH of the septum, ef 50-55%, s/p AV annuloplasty ring, elongated chordae, thickened leaflets, mild to mod. MR, multiple small jets/arotic bioprosthetic valve sits well in position, mild AI/ thickened TV w/ mild reurg./mild LA  . TEE WITHOUT CARDIOVERSION N/A 10/22/2018   Procedure:  TRANSESOPHAGEAL ECHOCARDIOGRAM (TEE);  Surgeon: [name], MD;  Location: [facility] ENDOSCOPY;  Service: Cardiovascular;  Laterality: N/A;  . TEE WITHOUT CARDIOVERSION N/A 11/25/2018   Procedure: TRANSESOPHAGEAL ECHOCARDIOGRAM (TEE);  Surgeon: [name], MD;  Location: [facility];  Service: Open Heart Surgery;  Laterality: N/A;  . TRANSCATHETER AORTIC VALVE REPLACEMENT, TRANSFEMORAL N/A 11/25/2018   Procedure: TRANSCATHETER AORTIC VALVE REPLACEMENT, TRANSFEMORAL;  Surgeon: [name], MD;  Location: [facility];  Service: Open Heart Surgery;  Laterality: N/A;  . TRANSTHORACIC ECHOCARDIOGRAM  03-23-2016   dr Irish Lack   EF 55-60%, reduced contribution atrial contraction to ventricular filling, due to increased ventricular diastolic pressure or atrial contratile dysfunction/  bioprosthetic AV w/ mod. regurg. (valve area 1.97cm^2)/ mild dilated ascending aorta/ mild thicken MV normal function annular ring prosthesis/severe LAE & mod. to sev.RAE/ PASP 3mmHg/ mild TR/ ventricle septal motion show paradox  . TRANSURETHRAL RESECTION OF BLADDER TUMOR N/A 02/13/2016   Procedure: TRANSURETHRAL RESECTION OF BLADDER TUMOR (TURBT);  Surgeon: Nickie Retort, MD;  Location: [facility];  Service: Urology;  Laterality: N/A;  . TRANSURETHRAL RESECTION OF BLADDER TUMOR N/A 09/04/2016   Procedure: TRANSURETHRAL RESECTION OF BLADDER TUMOR (TURBT);  Surgeon: Nickie Retort, MD;  Location: [facility];  Service: Urology;  Laterality: N/A;    There were no vitals filed for this visit.   Subjective Assessment - 03/16/19 0808    Subjective  Had TAVR 11/2018. He had cardiac rehab at [facility] for 6 weeks and now is referred to PT for balance and gait. He has not had any falls but frequently stumbles. He does not use AD. He is independent and still drives.    Pertinent History  PMH: CABG 10/29/18, aortic valve replacment (TAVR) 11/2018, bladder and prostate CA,CAD, RTC repair 09/1998 on Lt     Limitations  Walking;House hold activities;Lifting;Standing    How long can you stand comfortably?  20    How long can you walk comfortably?  can walk through supermarket, 2-3 blocks.    Patient Stated Goals  improve balance    Currently in Pain?  No/denies         [facility] PT Assessment - 03/16/19 0001      Assessment   Medical Diagnosis  Gait/balance    Referring Provider (PT)  [name], MD (PCP)    Onset Date/Surgical Date  --   2 years of gait/balance difficulty   Hand Dominance  Right    Next MD Visit  PRN    Prior Therapy  no PT, had cardiac rehab      Balance Screen   Has the patient fallen in the past 6 months  No   but frequently [place]  residence    Living Arrangements  Spouse/significant other      Prior Function   Level of Independence  Independent      Cognition   Overall Cognitive Status  Within Functional Limits for tasks assessed      Sensation   Light Touch  Appears Intact      Coordination   Gross Motor Movements are Fluid and Coordinated  Yes      ROM / Strength   AROM / PROM / Strength  AROM;Strength      AROM   Overall AROM   Within functional limits for tasks performed      Strength   Overall Strength  Within functional limits for tasks performed    Overall Strength Comments  4/5 Rt shoulder flexion, all others WNL tested grossly in sitting      Transfers   Transfers  Independent with all Transfers      Ambulation/Gait   Ambulation/Gait  Yes    Ambulation Distance (Feet)  150 Feet    Assistive device  None    Gait velocity  20 ft in 6.5 sec = 3.07 ft/sec    Gait Comments  slower velocity, slightly wider BOS and feet into ER, decreased arm swing on Rt, overall decreasd hip flexion and ankle DF      Standardized Balance Assessment   Standardized Balance Assessment  Berg Balance Test;Five Times Sit to Stand;Timed Up and Go Test    Five times sit to stand comments   12      Berg  Balance Test   Sit to Stand  Able to stand without using hands and stabilize independently    Standing Unsupported  Able to stand safely 2 minutes    Sitting with Back Unsupported but Feet Supported on Floor or Stool  Able to sit safely and securely 2 minutes    Stand to Sit  Sits safely with minimal use of hands    Transfers  Able to transfer safely, minor use of hands    Standing Unsupported with Eyes Closed  Able to stand 10 seconds with supervision    Standing Unsupported with Feet Together  Able to place feet together independently and stand 1 minute safely    From Standing, Reach Forward with Outstretched Arm  Can reach confidently >25 cm (10")    From Standing Position, Pick up Object from Floor  Able to pick up shoe safely and easily    From Standing Position, Turn to Look Behind Over each Shoulder  Looks behind from both sides and weight shifts well    Turn 360 Degrees  Able to turn 360 degrees safely but slowly    Standing Unsupported, Alternately Place Feet on Step/Stool  Able to complete 4 steps without aid or supervision    Standing Unsupported, One Foot in Front  Able to take small step independently and hold 30 seconds    Standing on One Leg  Tries to lift leg/unable to hold 3 seconds but remains standing independently    Total Score  46      Timed Up and Go Test   Normal TUG (seconds)  11      Functional Gait  Assessment   Gait assessed   Yes    Gait Level Surface  Walks 20 ft in less than 7 sec but greater than 5.5 sec, uses assistive device, slower speed, mild gait deviations, or deviates 6-10 in outside of the 12 in walkway width.   6.5 sec  Change in Gait Speed  Able to smoothly change walking speed without loss of balance or gait deviation. Deviate no more than 6 in outside of the 12 in walkway width.    Gait with Horizontal Head Turns  Performs head turns smoothly with no change in gait. Deviates no more than 6 in outside 12 in walkway width    Gait with Vertical Head  Turns  Performs task with slight change in gait velocity (eg, minor disruption to smooth gait path), deviates 6 - 10 in outside 12 in walkway width or uses assistive device    Gait and Pivot Turn  Pivot turns safely within 3 sec and stops quickly with no loss of balance.    Step Over Obstacle  Is able to step over one shoe box (4.5 in total height) without changing gait speed. No evidence of imbalance.    Gait with Narrow Base of Support  Ambulates less than 4 steps heel to toe or cannot perform without assistance.    Gait with Eyes Closed  Walks 20 ft, slow speed, abnormal gait pattern, evidence for imbalance, deviates 10-15 in outside 12 in walkway width. Requires more than 9 sec to ambulate 20 ft.    Ambulating Backwards  Walks 20 ft, uses assistive device, slower speed, mild gait deviations, deviates 6-10 in outside 12 in walkway width.    Steps  Alternating feet, no rail.    Total Score  21                Objective measurements completed on examination: See above findings.              PT Education - 03/16/19 0855    Education Details  HEP, POC, exam findings    Person(s) Educated  Patient    Methods  Explanation;Demonstration;Verbal cues;Handout    Comprehension  Verbalized understanding;Need further instruction      PT short term goals:  1) Pt will improve BERG balance score to at least 48. 4 weeks 03/15/19 2) Pt will be I and compliant with initial HEP. 4 weeks 03/15/19   PT Long Term Goals - 03/16/19 0901      PT LONG TERM GOAL #1   Title  Pt will be I and compliant with HEP. (target for all goals 6 weeks 04/27/19)    Status  New      PT LONG TERM GOAL #2   Title  Pt will improv BERG balance score >50 to show improved balance    Status  New      PT LONG TERM GOAL #3   Title  Pt will improve FGA to at least 24 to show improved balance.    Status  New      PT LONG TERM GOAL #4   Title  Pt will improve gait speed in 20 ft walk test from 6.5 sec to  less than 6 sec.    Status  New             Plan - 03/16/19 [id number]    Clinical Impression Statement  Pt presents with mild to moderate deficits in gait and balance. He is close to the cut off for balance scores where he may need assistive device however with balance training and PT he will likely improve and not need assistive device. He has overall decreaed balance, difficulty with foot clearance at times, decreaesed activity tolerance and stumbles at times. He will benefit from skiled PT to address these defecits.  Personal Factors and Comorbidities  Comorbidity 1;Comorbidity 2;Comorbidity 3+    Comorbidities  PMH: CABG 10/29/18, aortic valve replacment (TAVR) 11/2018, bladder and prostate CA,CAD, RTC repair 09/1998 on Lt    Examination-Activity Limitations  Carry;Squat;Lift;Stand;Locomotion Level    Examination-Participation Restrictions  Community Activity;Yard Work;Shop    Stability/Clinical Decision Making  Evolving/Moderate complexity    Clinical Decision Making  Moderate    Rehab Potential  Good    PT Frequency  2x / week    PT Duration  6 weeks    PT Treatment/Interventions  ADLs/Self Care Home Management;Aquatic Therapy;Gait training;Stair training;Functional mobility training;Therapeutic activities;Therapeutic exercise;Balance training;Neuromuscular re-education;Manual techniques;Orthotic Fit/Training;Taping    PT Next Visit Plan  review HEP and update PRN, needs higher level balance challenges and endurance.    PT Home Exercise Plan  Access Code: [email or web address] and Agree with Plan of Care  Patient       Patient will benefit from skilled therapeutic intervention in order to improve the following deficits and impairments:  Abnormal gait, Cardiopulmonary status limiting activity, Decreased activity tolerance, Decreased balance, Decreased endurance, Decreased strength, Difficulty walking  Visit Diagnosis: Other abnormalities of gait and mobility  Difficulty in walking,  not elsewhere classified     Problem List Patient Active Problem List   Diagnosis Date Noted  . History of mitral valve repair 11/26/2018  . Prosthetic valve dysfunction 11/25/2018  . Ectatic abdominal aorta ([place]) 11/25/2018  . 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 History of bladder cancer   . History of pulmonary embolus (PE)   . Acute on chronic diastolic heart failure ([place]) 04/23/2018  . History of aortic valve replacement 04/23/2018  . Atrial fibrillation ([place]) 04/23/2018  . Coronary atherosclerosis of native coronary artery 02/25/2014  . Hyperlipidemia 02/25/2014  . Embolism and thrombosis ([place]) 03/11/2013    Silvestre Mesi 03/16/2019, 9:04 AM  [facility] [address] [place], [name], [address] Phone: [phone]   Fax:  [phone]  Name: Eric [name] MRN: [id number] Date of Birth: 10-11-1929  Addendum created to add in short term goals.  Elsie Ra, PT, DPT 04/10/19 11:52 AM

## 2019-03-23 ENCOUNTER — Ambulatory Visit: Payer: Medicare Other | Attending: Internal Medicine

## 2019-03-23 ENCOUNTER — Other Ambulatory Visit: Payer: Self-pay

## 2019-03-23 DIAGNOSIS — R2689 Other abnormalities of gait and mobility: Secondary | ICD-10-CM | POA: Insufficient documentation

## 2019-03-23 DIAGNOSIS — R262 Difficulty in walking, not elsewhere classified: Secondary | ICD-10-CM | POA: Diagnosis present

## 2019-03-23 NOTE — Patient Instructions (Addendum)
Access Code: [email or web address]  URL: [email or web address]  Date: 03/23/2019  Prepared by: Geoffry Paradise   Exercises  Standing Single Leg Stance with Counter Support - 3 reps - 1 sets - 10 hold - 1x daily - 5x weekly  Backward Walking with Counter Support - 4 reps - 1 sets - 1x daily - 5x weekly  Standing Tandem Balance with Counter Support - 3 reps - 1 sets - 30 hold - 1x daily - 5x weekly  Alternating Step Taps with Counter Support - 10 reps - 1-2 sets - 1x daily - 5x weekly  Walking March - 4 reps - 1-2 sets - 1x daily - 5x weekly  HEEL AND TOE RAISES - 10 reps - 1-2 sets - 1x daily - 5x weekly  Clamshell with Resistance - 10 reps - 3 sets - 1x daily - 3x weekly RED band

## 2019-03-23 NOTE — Therapy (Signed)
[facility] [address] [place], [name], [address] Phone: [phone]   Fax:  [phone]  Physical Therapy Treatment  Patient Details  Name: Eric [name] MRN: [id number] Date of Birth: 1929-09-06 Referring Provider (PT): [name], MD (PCP)   Encounter Date: 03/23/2019  PT End of Session - 03/23/19 0948    Visit Number  2    Number of Visits  12    Date for PT Re-Evaluation  04/27/19    Authorization Type  UHC MCR    PT Start Time  0846    PT Stop Time  0931    PT Time Calculation (min)  45 min    Equipment Utilized During Treatment  Other (comment)   min guard to S prn   Activity Tolerance  Patient tolerated treatment well    Behavior During Therapy  [facility] for tasks assessed/performed       Past Medical History:  Diagnosis Date  . Atrial flutter, paroxysmal ([place])    a. dx 04-02-2014, s/p  successful cardioversion 04-06-2014.  Marland Kitchen Chronic anticoagulation    on Eliquis--  due to recurrent dvt's and pe's  . Dyslipidemia   . History of bladder cancer urologist-  dr [name]   02-13-2016  s/p TURBT per path high grade papillary urothelial carcinoma  . History of DVT (deep vein thrombosis)    2000-- RLE  . History of melanoma excision    left flank;  07/ 2014 left lower leg and right upper chest  . History of prostate cancer    Gleason 6--  s/p  radical prostatectomy 06/ 2000 in [place]---  no recurrence  . History of pulmonary embolus (PE)    2000 & 2005  bilateral  . Hx of valvuloplasty 06/19/2007   a. s/p mitral ring annuloplasty, repair ruptured chordae of P2 flail segments of MV  . S/P aortic valve replacement with bioprosthetic valve    06-19-2007  severe aortic valve stenosis  . 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 Single vessel coronary artery disease   cardiologist-  dr Irish Lack   a. 05/2007 CABG x 1: s/p SVG-OM;  b. 10/2010 Ex MV: EF 72%, inf attenuation w/o ischemia, brief run of PAT with  exercise. To  . Thrombocytopenia ([place])   . Thyroid goiter 2013   nodular  . Wears hearing aid    BILATERAL  . Wears partial dentures    UPPER    Past Surgical History:  Procedure Laterality Date  . AORTIC VALVE REPLACEMENT (AVR)/CORONARY ARTERY BYPASS GRAFTING (CABG)  06-19-2007  dr [name]   SVG to OM1 ;   AVR w/ #23 Mitralflow pericardial and ligation left atrial appendage;  MV repair w/ #28 Sorin 3-D memo Ring Annuloplasty with repair ruptured chordar of P-2 flail segments  . CARDIAC CATHETERIZATION  05-12-2007  dr Leonia Reeves   single vessel 30% ostial LAD,  80% LCx;  severe to critial AS,  moderate MR,  normal LVF, ef 70%,  normal right heart pressures and cardiac outputs,  mild elevated LV end-diastolic pressure    . CARDIOVASCULAR STRESS TEST  11-02-2010  dr Irish Lack   normal nuclear study w/ no ischemia or infarct/scar/  normal LV function and wall motion , ef 72%  . CARDIOVERSION N/A 04/06/2014   Procedure: CARDIOVERSION;  Surgeon: [name], MD;  Location: [facility];  Service: Cardiovascular;  Laterality: N/A;   successful  . CATARACT EXTRACTION W/ INTRAOCULAR LENS  IMPLANT, BILATERAL  2012  approx  .  CORONARY ARTERY BYPASS GRAFT    . RETROPUBIC RADICAL PROSTATECTOMY  06/ 2000  in [place]  . RIGHT/LEFT HEART CATH AND CORONARY/GRAFT ANGIOGRAPHY N/A 10/29/2018   Procedure: RIGHT/LEFT HEART CATH AND CORONARY/GRAFT ANGIOGRAPHY;  Surgeon: [name], MD;  Location: [facility] CV LAB;  Service: Cardiovascular;  Laterality: N/A;  . ROTATOR CUFF REPAIR Left 09/1998  . TEE WITHOUT CARDIOVERSION N/A 04/06/2014   Procedure: TRANSESOPHAGEAL ECHOCARDIOGRAM (TEE);  Surgeon: [name], MD;  Location: [facility] ENDOSCOPY;  Service: Cardiovascular;  Laterality: N/A;  mild focal basa LVH of the septum, ef 50-55%, s/p AV annuloplasty ring, elongated chordae, thickened leaflets, mild to mod. MR, multiple small jets/arotic bioprosthetic valve sits well in position, mild AI/ thickened  TV w/ mild reurg./mild LA  . TEE WITHOUT CARDIOVERSION N/A 10/22/2018   Procedure: TRANSESOPHAGEAL ECHOCARDIOGRAM (TEE);  Surgeon: [name], MD;  Location: [facility] ENDOSCOPY;  Service: Cardiovascular;  Laterality: N/A;  . TEE WITHOUT CARDIOVERSION N/A 11/25/2018   Procedure: TRANSESOPHAGEAL ECHOCARDIOGRAM (TEE);  Surgeon: [name], MD;  Location: [facility];  Service: Open Heart Surgery;  Laterality: N/A;  . TRANSCATHETER AORTIC VALVE REPLACEMENT, TRANSFEMORAL N/A 11/25/2018   Procedure: TRANSCATHETER AORTIC VALVE REPLACEMENT, TRANSFEMORAL;  Surgeon: [name], MD;  [facility];  Service: Open Heart Surgery;  Laterality: N/A;  . TRANSTHORACIC ECHOCARDIOGRAM  03-23-2016   dr Irish Lack   EF 55-60%, reduced contribution atrial contraction to ventricular filling, due to increased ventricular diastolic pressure or atrial contratile dysfunction/  bioprosthetic AV w/ mod. regurg. (valve area 1.97cm^2)/ mild dilated ascending aorta/ mild thicken MV normal function annular ring prosthesis/severe LAE & mod. to sev.RAE/ PASP 65mmHg/ mild TR/ ventricle septal motion show paradox  . TRANSURETHRAL RESECTION OF BLADDER TUMOR N/A 02/13/2016   Procedure: TRANSURETHRAL RESECTION OF BLADDER TUMOR (TURBT);  Surgeon: Nickie Retort, MD;  Location: [facility];  Service: Urology;  Laterality: N/A;  . TRANSURETHRAL RESECTION OF BLADDER TUMOR N/A 09/04/2016   Procedure: TRANSURETHRAL RESECTION OF BLADDER TUMOR (TURBT);  Surgeon: Nickie Retort, MD;  Location: [facility];  Service: Urology;  Laterality: N/A;    There were no vitals filed for this visit.  Subjective Assessment - 03/23/19 0852    Subjective  Pt denied falls or changes since last visit. Pt feels HEP is moderate.    Pertinent History  PMH: CABG 10/29/18, aortic valve replacment (TAVR) 11/2018, bladder and prostate CA,CAD, RTC repair 09/1998 on Lt    Patient Stated Goals  improve balance    Currently in Pain?   No/denies       Neuro re-ed and therex: PT reviewed and updated HEP. Performed either in sidelying (clams) or at counter with intermittent UE support and min guard to S for safety. Cues and demo for correct technique. Access Code: [email or web address]  URL: [email or web address]  Date: 03/23/2019  Prepared by: Geoffry Paradise   Exercises  Standing Single Leg Stance with Counter Support - 3 reps - 1 sets - 10 hold - 1x daily - 5x weekly  (NMR) Backward Walking with Counter Support - 4 reps - 1 sets - 1x daily - 5x weekly (NMR) Standing Tandem Balance with Counter Support - 3 reps - 1 sets - 30 hold - 1x daily - 5x weekly (NMR) Alternating Step Taps with Counter Support - 10 reps - 1-2 sets - 1x daily - 5x weekly (NMR) Walking March - 4 reps - 1-2 sets - 1x daily - 5x weekly (NMR) HEEL AND TOE RAISES - 10 reps -  1-2 sets - 1x daily - 5x weekly (NMR) Clamshell with Resistance - 10 reps - 3 sets - 1x daily - 3x weekly RED band Lianne Moris)                    [facility] Adult PT Treatment/Exercise - 03/23/19 0946      High Level Balance   High Level Balance Activities  Head turns;Other (comment)   head nods   High Level Balance Comments  Performed at counter with intermittent UE support with incr. postural sway noted but pt able to self correct. 4x10reps/activity.             PT Education - 03/23/19 0948    Education Details  PT reviewed and updated HEP as indicated.    Person(s) Educated  Patient    Methods  Explanation;Demonstration;Tactile cues;Verbal cues;Handout    Comprehension  Verbalized understanding;Returned demonstration          PT Long Term Goals - 03/16/19 0901      PT LONG TERM GOAL #1   Title  Pt will be I and compliant with HEP. (target for all goals 6 weeks 04/27/19)    Status  New      PT LONG TERM GOAL #2   Title  Pt will improv BERG balance score >50 to show improved balance    Status  New      PT LONG TERM GOAL #3   Title  Pt will  improve FGA to at least 24 to show improved balance.    Status  New      PT LONG TERM GOAL #4   Title  Pt will improve gait speed in 20 ft walk test from 6.5 sec to less than 6 sec.    Status  New            Plan - 03/23/19 0949    Clinical Impression Statement  Today's skilled session focused on updating/reviewing HEP to improve balance and strength. Pt required min guard to S during HEP to ensure safety and tactile/verbal cues for correct technique. Pt demonstrated incr. postural sway during balance activities, which improved with 1-2 UE support. Pt would continue to benefit from skilled PT to improve safety during functional mobility.    Personal Factors and Comorbidities  Comorbidity 1;Comorbidity 2;Comorbidity 3+    Comorbidities  PMH: CABG 10/29/18, aortic valve replacment (TAVR) 11/2018, bladder and prostate CA,CAD, RTC repair 09/1998 on Lt    Examination-Activity Limitations  Carry;Squat;Lift;Stand;Locomotion Level    Examination-Participation Restrictions  Community Activity;Yard Work;Shop    Stability/Clinical Decision Making  Evolving/Moderate complexity    Rehab Potential  Good    PT Frequency  2x / week    PT Duration  6 weeks    PT Treatment/Interventions  ADLs/Self Care Home Management;Aquatic Therapy;Gait training;Stair training;Functional mobility training;Therapeutic activities;Therapeutic exercise;Balance training;Neuromuscular re-education;Manual techniques;Orthotic Fit/Training;Taping    PT Next Visit Plan  review HEP and update PRN, needs higher level balance challenges and endurance.    PT Home Exercise Plan  Access Code: [email or web address] and Agree with Plan of Care  Patient       Patient will benefit from skilled therapeutic intervention in order to improve the following deficits and impairments:  Abnormal gait, Cardiopulmonary status limiting activity, Decreased activity tolerance, Decreased balance, Decreased endurance, Decreased strength, Difficulty  walking  Visit Diagnosis: Other abnormalities of gait and mobility  Difficulty in walking, not elsewhere classified     Problem List Patient Active Problem  List   Diagnosis Date Noted  . History of mitral valve repair 11/26/2018  . Prosthetic valve dysfunction 11/25/2018  . Ectatic abdominal aorta ([place]) 11/25/2018  . 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 History of bladder cancer   . History of pulmonary embolus (PE)   . Acute on chronic diastolic heart failure ([place]) 04/23/2018  . History of aortic valve replacement 04/23/2018  . Atrial fibrillation ([place]) 04/23/2018  . Coronary atherosclerosis of native coronary artery 02/25/2014  . Hyperlipidemia 02/25/2014  . Embolism and thrombosis ([place]) 03/11/2013    Inge Waldroup L 03/23/2019, 9:54 AM  [facility] [address] [place], [name], [address] Phone: [phone]   Fax:  [phone]  Name: Eric [name] MRN: [id number] Date of Birth: 1930/02/29  Geoffry Paradise, PT,DPT 03/23/19 9:54 AM Phone: [phone] Fax: [phone]

## 2019-03-25 ENCOUNTER — Other Ambulatory Visit: Payer: Self-pay

## 2019-03-25 ENCOUNTER — Ambulatory Visit: Payer: Medicare Other | Admitting: Physical Therapy

## 2019-03-25 DIAGNOSIS — R2689 Other abnormalities of gait and mobility: Secondary | ICD-10-CM

## 2019-03-25 DIAGNOSIS — R262 Difficulty in walking, not elsewhere classified: Secondary | ICD-10-CM

## 2019-03-25 NOTE — Therapy (Signed)
[facility] [address] [place], [name], [address] Phone: [phone]   Fax:  [phone]  Physical Therapy Treatment  Patient Details  Name: Eric [name] MRN: [id number] Date of Birth: August 08, 1929 Referring Provider (PT): [name], MD (PCP)   Encounter Date: 03/25/2019  PT End of Session - 03/25/19 1624    Visit Number  3    Number of Visits  12    Date for PT Re-Evaluation  04/27/19    Authorization Type  UHC MCR    PT Start Time  [id number]    PT Stop Time  1615    PT Time Calculation (min)  40 min    Equipment Utilized During Treatment  Gait belt   min guard to S prn   Activity Tolerance  Patient tolerated treatment well    Behavior During Therapy  [facility] for tasks assessed/performed       Past Medical History:  Diagnosis Date  . Atrial flutter, paroxysmal ([place])    a. dx 04-02-2014, s/p  successful cardioversion 04-06-2014.  Marland Kitchen Chronic anticoagulation    on Eliquis--  due to recurrent dvt's and pe's  . Dyslipidemia   . History of bladder cancer urologist-  dr [name]   02-13-2016  s/p TURBT per path high grade papillary urothelial carcinoma  . History of DVT (deep vein thrombosis)    2000-- RLE  . History of melanoma excision    left flank;  07/ 2014 left lower leg and right upper chest  . History of prostate cancer    Gleason 6--  s/p  radical prostatectomy 06/ 2000 in [place]---  no recurrence  . History of pulmonary embolus (PE)    2000 & 2005  bilateral  . Hx of valvuloplasty 06/19/2007   a. s/p mitral ring annuloplasty, repair ruptured chordae of P2 flail segments of MV  . S/P aortic valve replacement with bioprosthetic valve    06-19-2007  severe aortic valve stenosis  . 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 Single vessel coronary artery disease   cardiologist-  dr Irish Lack   a. 05/2007 CABG x 1: s/p SVG-OM;  b. 10/2010 Ex MV: EF 72%, inf attenuation w/o ischemia, brief run of PAT with exercise.  To  . Thrombocytopenia ([place])   . Thyroid goiter 2013   nodular  . Wears hearing aid    BILATERAL  . Wears partial dentures    UPPER    Past Surgical History:  Procedure Laterality Date  . AORTIC VALVE REPLACEMENT (AVR)/CORONARY ARTERY BYPASS GRAFTING (CABG)  06-19-2007  dr [name]   SVG to OM1 ;   AVR w/ #23 Mitralflow pericardial and ligation left atrial appendage;  MV repair w/ #28 Sorin 3-D memo Ring Annuloplasty with repair ruptured chordar of P-2 flail segments  . CARDIAC CATHETERIZATION  05-12-2007  dr Leonia Reeves   single vessel 30% ostial LAD,  80% LCx;  severe to critial AS,  moderate MR,  normal LVF, ef 70%,  normal right heart pressures and cardiac outputs,  mild elevated LV end-diastolic pressure    . CARDIOVASCULAR STRESS TEST  11-02-2010  dr Irish Lack   normal nuclear study w/ no ischemia or infarct/scar/  normal LV function and wall motion , ef 72%  . CARDIOVERSION N/A 04/06/2014   Procedure: CARDIOVERSION;  Surgeon: [name], MD;  Location: [facility];  Service: Cardiovascular;  Laterality: N/A;   successful  . CATARACT EXTRACTION W/ INTRAOCULAR LENS  IMPLANT, BILATERAL  2012  approx  .  CORONARY ARTERY BYPASS GRAFT    . RETROPUBIC RADICAL PROSTATECTOMY  06/ 2000  in [place]  . RIGHT/LEFT HEART CATH AND CORONARY/GRAFT ANGIOGRAPHY N/A 10/29/2018   Procedure: RIGHT/LEFT HEART CATH AND CORONARY/GRAFT ANGIOGRAPHY;  Surgeon: [name], MD;  Location: [facility] CV LAB;  Service: Cardiovascular;  Laterality: N/A;  . ROTATOR CUFF REPAIR Left 09/1998  . TEE WITHOUT CARDIOVERSION N/A 04/06/2014   Procedure: TRANSESOPHAGEAL ECHOCARDIOGRAM (TEE);  Surgeon: [name], MD;  Location: [facility] ENDOSCOPY;  Service: Cardiovascular;  Laterality: N/A;  mild focal basa LVH of the septum, ef 50-55%, s/p AV annuloplasty ring, elongated chordae, thickened leaflets, mild to mod. MR, multiple small jets/arotic bioprosthetic valve sits well in position, mild AI/ thickened TV w/ mild  reurg./mild LA  . TEE WITHOUT CARDIOVERSION N/A 10/22/2018   Procedure: TRANSESOPHAGEAL ECHOCARDIOGRAM (TEE);  Surgeon: [name], MD;  Location: [facility] ENDOSCOPY;  Service: Cardiovascular;  Laterality: N/A;  . TEE WITHOUT CARDIOVERSION N/A 11/25/2018   Procedure: TRANSESOPHAGEAL ECHOCARDIOGRAM (TEE);  Surgeon: [name], MD;  Location: [facility];  Service: Open Heart Surgery;  Laterality: N/A;  . TRANSCATHETER AORTIC VALVE REPLACEMENT, TRANSFEMORAL N/A 11/25/2018   Procedure: TRANSCATHETER AORTIC VALVE REPLACEMENT, TRANSFEMORAL;  Surgeon: [name], MD;  Location: [facility];  Service: Open Heart Surgery;  Laterality: N/A;  . TRANSTHORACIC ECHOCARDIOGRAM  03-23-2016   dr Irish Lack   EF 55-60%, reduced contribution atrial contraction to ventricular filling, due to increased ventricular diastolic pressure or atrial contratile dysfunction/  bioprosthetic AV w/ mod. regurg. (valve area 1.97cm^2)/ mild dilated ascending aorta/ mild thicken MV normal function annular ring prosthesis/severe LAE & mod. to sev.RAE/ PASP 33mmHg/ mild TR/ ventricle septal motion show paradox  . TRANSURETHRAL RESECTION OF BLADDER TUMOR N/A 02/13/2016   Procedure: TRANSURETHRAL RESECTION OF BLADDER TUMOR (TURBT);  Surgeon: Nickie Retort, MD;  Location: [facility];  Service: Urology;  Laterality: N/A;  . TRANSURETHRAL RESECTION OF BLADDER TUMOR N/A 09/04/2016   Procedure: TRANSURETHRAL RESECTION OF BLADDER TUMOR (TURBT);  Surgeon: Nickie Retort, MD;  Location: [facility];  Service: Urology;  Laterality: N/A;    There were no vitals filed for this visit.  Subjective Assessment - 03/25/19 1623    Subjective  Relays compliance with HEP. He had 6 month check up at [facility] today for his bladder CA and it was "all good news"    Pertinent History  PMH: CABG 10/29/18, aortic valve replacment (TAVR) 11/2018, bladder and prostate CA,CAD, RTC repair 09/1998 on Lt    Limitations  Walking;House hold  activities;Lifting;Standing    How long can you stand comfortably?  20    How long can you walk comfortably?  can walk through supermarket, 2-3 blocks.    Patient Stated Goals  improve balance    Currently in Pain?  No/denies                       [facility] [place] [facility] Adult PT Treatment/Exercise - 03/25/19 0001      High Level Balance   High Level Balance Comments  in bars up/down X 3 ea: tandem walk, retro tandem walk, sidestepping on foam beam, stepping over hurldes fwd and lateral, then bosu ball 1 min X 2, rocker board ant-post X 20, lateral X 20 all intermitt UE support as needed. Then ambulated around gym tossing ball to himself with CGA one lap to his Rt, one latp to his Lt (115 ft ea). Then step ups with alt leg march for increased  SLS challenge onto first 6 inch stair with intermitt UE support PRN X 10 each leg                  PT Long Term Goals - 03/16/19 0901      PT LONG TERM GOAL #1   Title  Pt will be I and compliant with HEP. (target for all goals 6 weeks 04/27/19)    Status  New      PT LONG TERM GOAL #2   Title  Pt will improv BERG balance score >50 to show improved balance    Status  New      PT LONG TERM GOAL #3   Title  Pt will improve FGA to at least 24 to show improved balance.    Status  New      PT LONG TERM GOAL #4   Title  Pt will improve gait speed in 20 ft walk test from 6.5 sec to less than 6 sec.    Status  New            Plan - 03/25/19 1625    Clinical Impression Statement  He was progressed with dynamic balance today with overall supervision in parallell bars. He had good tolerance to PT, he has continued balance deficits and will continue to benefit from skilled PT    Personal Factors and Comorbidities  Comorbidity 1;Comorbidity 2;Comorbidity 3+    Comorbidities  PMH: CABG 10/29/18, aortic valve replacment (TAVR) 11/2018, bladder and prostate CA,CAD, RTC repair 09/1998 on Lt    Examination-Activity Limitations   Carry;Squat;Lift;Stand;Locomotion Level    Examination-Participation Restrictions  Community Activity;Yard Work;Shop    Stability/Clinical Decision Making  Evolving/Moderate complexity    Rehab Potential  Good    PT Frequency  2x / week    PT Duration  6 weeks    PT Treatment/Interventions  ADLs/Self Care Home Management;Aquatic Therapy;Gait training;Stair training;Functional mobility training;Therapeutic activities;Therapeutic exercise;Balance training;Neuromuscular re-education;Manual techniques;Orthotic Fit/Training;Taping    PT Next Visit Plan  needs higher level balance challenges and endurance.    PT Home Exercise Plan  Access Code: [email or web address] and Agree with Plan of Care  Patient       Patient will benefit from skilled therapeutic intervention in order to improve the following deficits and impairments:  Abnormal gait, Cardiopulmonary status limiting activity, Decreased activity tolerance, Decreased balance, Decreased endurance, Decreased strength, Difficulty walking  Visit Diagnosis: Other abnormalities of gait and mobility  Difficulty in walking, not elsewhere classified     Problem List Patient Active Problem List   Diagnosis Date Noted  . History of mitral valve repair 11/26/2018  . Prosthetic valve dysfunction 11/25/2018  . Ectatic abdominal aorta ([place]) 11/25/2018  . 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 History of bladder cancer   . History of pulmonary embolus (PE)   . Acute on chronic diastolic heart failure ([place]) 04/23/2018  . History of aortic valve replacement 04/23/2018  . Atrial fibrillation ([place]) 04/23/2018  . Coronary atherosclerosis of native coronary artery 02/25/2014  . Hyperlipidemia 02/25/2014  . Embolism and thrombosis ([place]) 03/11/2013    Eric [name] 03/25/2019, 4:26 PM  [facility] [address] [place], [name], [address] Phone: [phone]   Fax:   [phone]  Name: Eric [name] MRN: [id number] Date of Birth: June 26, 1929

## 2019-03-30 ENCOUNTER — Ambulatory Visit: Payer: Medicare Other | Admitting: Physical Therapy

## 2019-03-30 ENCOUNTER — Other Ambulatory Visit: Payer: Self-pay

## 2019-03-30 DIAGNOSIS — R2689 Other abnormalities of gait and mobility: Secondary | ICD-10-CM

## 2019-03-30 DIAGNOSIS — R262 Difficulty in walking, not elsewhere classified: Secondary | ICD-10-CM

## 2019-03-30 NOTE — Therapy (Signed)
[facility] [address] [place], [name], [address] Phone: [phone]   Fax:  [phone]  Physical Therapy Treatment  Patient Details  Name: Eric [name] MRN: [id number] Date of Birth: [birth year] Referring Provider (PT): [name], MD (PCP)   Encounter Date: 03/30/2019  PT End of Session - 03/30/19 0944    Visit Number  4    Number of Visits  12    Date for PT Re-Evaluation  04/27/19    Authorization Type  UHC MCR    PT Start Time  0850    PT Stop Time  0930    PT Time Calculation (min)  40 min    Equipment Utilized During Treatment  --   min guard to S prn   Activity Tolerance  Patient tolerated treatment well    Behavior During Therapy  [facility] for tasks assessed/performed       Past Medical History:  Diagnosis Date  . Atrial flutter, paroxysmal ([place])    a. dx 04-02-2014, s/p  successful cardioversion 04-06-2014.  Marland Kitchen Chronic anticoagulation    on Eliquis--  due to recurrent dvt's and pe's  . Dyslipidemia   . History of bladder cancer urologist-  dr [name]   02-13-2016  s/p TURBT per path high grade papillary urothelial carcinoma  . History of DVT (deep vein thrombosis)    2000-- RLE  . History of melanoma excision    left flank;  07/ 2014 left lower leg and right upper chest  . History of prostate cancer    Gleason 6--  s/p  radical prostatectomy 06/ 2000 in [place]---  no recurrence  . History of pulmonary embolus (PE)    2000 & 2005  bilateral  . Hx of valvuloplasty 06/19/2007   a. s/p mitral ring annuloplasty, repair ruptured chordae of P2 flail segments of MV  . S/P aortic valve replacement with bioprosthetic valve    06-19-2007  severe aortic valve stenosis  . 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 Single vessel coronary artery disease   cardiologist-  dr Irish Lack   a. 05/2007 CABG x 1: s/p SVG-OM;  b. 10/2010 Ex MV: EF 72%, inf attenuation w/o ischemia, brief run of PAT with exercise. To  .  Thrombocytopenia ([place])   . Thyroid goiter 2013   nodular  . Wears hearing aid    BILATERAL  . Wears partial dentures    UPPER    Past Surgical History:  Procedure Laterality Date  . AORTIC VALVE REPLACEMENT (AVR)/CORONARY ARTERY BYPASS GRAFTING (CABG)  06-19-2007  dr [name]   SVG to OM1 ;   AVR w/ #23 Mitralflow pericardial and ligation left atrial appendage;  MV repair w/ #28 Sorin 3-D memo Ring Annuloplasty with repair ruptured chordar of P-2 flail segments  . CARDIAC CATHETERIZATION  05-12-2007  dr Leonia Reeves   single vessel 30% ostial LAD,  80% LCx;  severe to critial AS,  moderate MR,  normal LVF, ef 70%,  normal right heart pressures and cardiac outputs,  mild elevated LV end-diastolic pressure    . CARDIOVASCULAR STRESS TEST  11-02-2010  dr Irish Lack   normal nuclear study w/ no ischemia or infarct/scar/  normal LV function and wall motion , ef 72%  . CARDIOVERSION N/A 04/06/2014   Procedure: CARDIOVERSION;  Surgeon: [name], MD;  Location: [facility];  Service: Cardiovascular;  Laterality: N/A;   successful  . CATARACT EXTRACTION W/ INTRAOCULAR LENS  IMPLANT, BILATERAL  2012  approx  . CORONARY  ARTERY BYPASS GRAFT    . RETROPUBIC RADICAL PROSTATECTOMY  06/ 2000  in [place], [place]  . RIGHT/LEFT HEART CATH AND CORONARY/GRAFT ANGIOGRAPHY N/A 10/29/2018   Procedure: RIGHT/LEFT HEART CATH AND CORONARY/GRAFT ANGIOGRAPHY;  Surgeon: [name], MD;  Location: [facility] CV LAB;  Service: Cardiovascular;  Laterality: N/A;  . ROTATOR CUFF REPAIR Left 09/1998  . TEE WITHOUT CARDIOVERSION N/A 04/06/2014   Procedure: TRANSESOPHAGEAL ECHOCARDIOGRAM (TEE);  Surgeon: [name], MD;  Location: [facility] ENDOSCOPY;  Service: Cardiovascular;  Laterality: N/A;  mild focal basa LVH of the septum, ef 50-55%, s/p AV annuloplasty ring, elongated chordae, thickened leaflets, mild to mod. MR, multiple small jets/arotic bioprosthetic valve sits well in position, mild AI/ thickened TV w/ mild  reurg./mild LA  . TEE WITHOUT CARDIOVERSION N/A 10/22/2018   Procedure: TRANSESOPHAGEAL ECHOCARDIOGRAM (TEE);  Surgeon: [name], MD;  Location: [facility] ENDOSCOPY;  Service: Cardiovascular;  Laterality: N/A;  . TEE WITHOUT CARDIOVERSION N/A 11/25/2018   Procedure: TRANSESOPHAGEAL ECHOCARDIOGRAM (TEE);  Surgeon: [name], MD;  Location: [facility];  Service: Open Heart Surgery;  Laterality: N/A;  . TRANSCATHETER AORTIC VALVE REPLACEMENT, TRANSFEMORAL N/A 11/25/2018   Procedure: TRANSCATHETER AORTIC VALVE REPLACEMENT, TRANSFEMORAL;  Surgeon: [name], MD;  Location: [facility];  Service: Open Heart Surgery;  Laterality: N/A;  . TRANSTHORACIC ECHOCARDIOGRAM  03-23-2016   dr Irish Lack   EF 55-60%, reduced contribution atrial contraction to ventricular filling, due to increased ventricular diastolic pressure or atrial contratile dysfunction/  bioprosthetic AV w/ mod. regurg. (valve area 1.97cm^2)/ mild dilated ascending aorta/ mild thicken MV normal function annular ring prosthesis/severe LAE & mod. to sev.RAE/ PASP 81mmHg/ mild TR/ ventricle septal motion show paradox  . TRANSURETHRAL RESECTION OF BLADDER TUMOR N/A 02/13/2016   Procedure: TRANSURETHRAL RESECTION OF BLADDER TUMOR (TURBT);  Surgeon: Nickie Retort, MD;  Location: [facility];  Service: Urology;  Laterality: N/A;  . TRANSURETHRAL RESECTION OF BLADDER TUMOR N/A 09/04/2016   Procedure: TRANSURETHRAL RESECTION OF BLADDER TUMOR (TURBT);  Surgeon: Nickie Retort, MD;  Location: [facility];  Service: Urology;  Laterality: N/A;    There were no vitals filed for this visit.  Subjective Assessment - 03/30/19 0943    Subjective  Relays he felt a little sore after last time which was good, relays no complaints today except for dizziness after treadmill                       [facility] Adult PT Treatment/Exercise - 03/30/19 0001      High Level Balance   High Level Balance Comments  SLS X 5 reps ea  side as long as he can hold (about 5 sec on avg), sidestepping on foam beam up/down  X 5, then on beam for lunge stance balance with head turns, step ups onto 6 inch step with alt leg march  x10 bilat, [place] up/down X 3 ea with heavy cues for technique and needed min A for balance with this one.       Exercises   Exercises  Other Exercises    Other Exercises   treadmill warm up 2.0 to 2.3 for 4 min (stopped due to some dizziness), heel toe raises X 15 bilat standing, then seated ankle DF on Lt red X 20, leg press 50 lbs bilat 3X15                  PT Long Term Goals - 03/16/19 [id number]  PT LONG TERM GOAL #1   Title  Pt will be I and compliant with HEP. (target for all goals 6 weeks 04/27/19)    Status  New      PT LONG TERM GOAL #2   Title  Pt will improv BERG balance score >50 to show improved balance    Status  New      PT LONG TERM GOAL #3   Title  Pt will improve FGA to at least 24 to show improved balance.    Status  New      PT LONG TERM GOAL #4   Title  Pt will improve gait speed in 20 ft walk test from 6.5 sec to less than 6 sec.    Status  New            Plan - 03/30/19 0944    Clinical Impression Statement  Session again focused on higher level balance challenges with leg strength and endurance. He did have one episode of dizziness with treadmill but after seated rest break this resolved without any other dizziness the rest of the session. He was found to have decreased ankle DF strength on his left leg today so added exercises for this. Continue POC    Personal Factors and Comorbidities  Comorbidity 1;Comorbidity 2;Comorbidity 3+    Comorbidities  PMH: CABG 10/29/18, aortic valve replacment (TAVR) 11/2018, bladder and prostate CA,CAD, RTC repair 09/1998 on Lt    Examination-Activity Limitations  Carry;Squat;Lift;Stand;Locomotion Level    Examination-Participation Restrictions  Community Activity;Yard Work;Shop    Stability/Clinical Decision Making   Evolving/Moderate complexity    Rehab Potential  Good    PT Frequency  2x / week    PT Duration  6 weeks    PT Treatment/Interventions  ADLs/Self Care Home Management;Aquatic Therapy;Gait training;Stair training;Functional mobility training;Therapeutic activities;Therapeutic exercise;Balance training;Neuromuscular re-education;Manual techniques;Orthotic Fit/Training;Taping    PT Next Visit Plan  needs higher level balance challenges and endurance.    PT Home Exercise Plan  Access Code: [email or web address] and Agree with Plan of Care  Patient       Patient will benefit from skilled therapeutic intervention in order to improve the following deficits and impairments:  Abnormal gait, Cardiopulmonary status limiting activity, Decreased activity tolerance, Decreased balance, Decreased endurance, Decreased strength, Difficulty walking  Visit Diagnosis: Other abnormalities of gait and mobility  Difficulty in walking, not elsewhere classified     Problem List Patient Active Problem List   Diagnosis Date Noted  . History of mitral valve repair 11/26/2018  . Prosthetic valve dysfunction 11/25/2018  . Ectatic abdominal aorta ([place]) 11/25/2018  . 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 History of bladder cancer   . History of pulmonary embolus (PE)   . Acute on chronic diastolic heart failure ([place]) 04/23/2018  . History of aortic valve replacement 04/23/2018  . Atrial fibrillation ([place]) 04/23/2018  . Coronary atherosclerosis of native coronary artery 02/25/2014  . Hyperlipidemia 02/25/2014  . Embolism and thrombosis ([place]) 03/11/2013    Silvestre Mesi 03/30/2019, 9:46 AM  [facility] [address] [place], [name], [address] Phone: [phone]   Fax:  [phone]  Name: Eric [name] MRN: [id number] Date of Birth: [birth year]

## 2019-04-01 ENCOUNTER — Other Ambulatory Visit: Payer: Self-pay

## 2019-04-01 ENCOUNTER — Ambulatory Visit: Payer: Medicare Other | Admitting: Physical Therapy

## 2019-04-01 DIAGNOSIS — R262 Difficulty in walking, not elsewhere classified: Secondary | ICD-10-CM

## 2019-04-01 DIAGNOSIS — R2689 Other abnormalities of gait and mobility: Secondary | ICD-10-CM

## 2019-04-01 NOTE — Therapy (Signed)
[facility] [address] [place], [name], [address] Phone: [phone]   Fax:  [phone]  Physical Therapy Treatment  Patient Details  Name: Eric [name] MRN: [id number] Date of Birth: Feb 11, 1930 Referring Provider (PT): [name], MD (PCP)   Encounter Date: 04/01/2019  PT End of Session - 04/01/19 1109    Visit Number  5    Number of Visits  12    Date for PT Re-Evaluation  04/27/19    Authorization Type  UHC MCR    PT Start Time  [id number]    PT Stop Time  1100    PT Time Calculation (min)  45 min    Equipment Utilized During Treatment  --   min guard to S prn   Activity Tolerance  Patient tolerated treatment well    Behavior During Therapy  [facility] for tasks assessed/performed       Past Medical History:  Diagnosis Date  . Atrial flutter, paroxysmal ([place])    a. dx 04-02-2014, s/p  successful cardioversion 04-06-2014.  Marland Kitchen Chronic anticoagulation    on Eliquis--  due to recurrent dvt's and pe's  . Dyslipidemia   . History of bladder cancer urologist-  dr [name]   02-13-2016  s/p TURBT per path high grade papillary urothelial carcinoma  . History of DVT (deep vein thrombosis)    2000-- RLE  . History of melanoma excision    left flank;  07/ 2014 left lower leg and right upper chest  . History of prostate cancer    Gleason 6--  s/p  radical prostatectomy 06/ 2000 in [place]---  no recurrence  . History of pulmonary embolus (PE)    2000 & 2005  bilateral  . Hx of valvuloplasty 06/19/2007   a. s/p mitral ring annuloplasty, repair ruptured chordae of P2 flail segments of MV  . S/P aortic valve replacement with bioprosthetic valve    06-19-2007  severe aortic valve stenosis  . 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 Single vessel coronary artery disease   cardiologist-  dr Irish Lack   a. 05/2007 CABG x 1: s/p SVG-OM;  b. 10/2010 Ex MV: EF 72%, inf attenuation w/o ischemia, brief run of PAT with exercise. To  .  Thrombocytopenia ([place])   . Thyroid goiter 2013   nodular  . Wears hearing aid    BILATERAL  . Wears partial dentures    UPPER    Past Surgical History:  Procedure Laterality Date  . AORTIC VALVE REPLACEMENT (AVR)/CORONARY ARTERY BYPASS GRAFTING (CABG)  06-19-2007  dr [name]   SVG to OM1 ;   AVR w/ #23 Mitralflow pericardial and ligation left atrial appendage;  MV repair w/ #28 Sorin 3-D memo Ring Annuloplasty with repair ruptured chordar of P-2 flail segments  . CARDIAC CATHETERIZATION  05-12-2007  dr Leonia Reeves   single vessel 30% ostial LAD,  80% LCx;  severe to critial AS,  moderate MR,  normal LVF, ef 70%,  normal right heart pressures and cardiac outputs,  mild elevated LV end-diastolic pressure    . CARDIOVASCULAR STRESS TEST  11-02-2010  dr Irish Lack   normal nuclear study w/ no ischemia or infarct/scar/  normal LV function and wall motion , ef 72%  . CARDIOVERSION N/A 04/06/2014   Procedure: CARDIOVERSION;  Surgeon: [name], MD;  Location: [facility];  Service: Cardiovascular;  Laterality: N/A;   successful  . CATARACT EXTRACTION W/ INTRAOCULAR LENS  IMPLANT, BILATERAL  2012  approx  . CORONARY  ARTERY BYPASS GRAFT    . RETROPUBIC RADICAL PROSTATECTOMY  06/ 2000  in [place], [place]  . RIGHT/LEFT HEART CATH AND CORONARY/GRAFT ANGIOGRAPHY N/A 10/29/2018   Procedure: RIGHT/LEFT HEART CATH AND CORONARY/GRAFT ANGIOGRAPHY;  Surgeon: [name], MD;  Location: [facility] CV [facility];  Service: Cardiovascular;  Laterality: N/A;  . ROTATOR CUFF REPAIR Left 09/1998  . TEE WITHOUT CARDIOVERSION N/A 04/06/2014   Procedure: TRANSESOPHAGEAL ECHOCARDIOGRAM (TEE);  Surgeon: [name], MD;  Location: [facility] ENDOSCOPY;  Service: Cardiovascular;  Laterality: N/A;  mild focal basa LVH of the septum, ef 50-55%, s/p AV annuloplasty ring, elongated chordae, thickened leaflets, mild to mod. MR, multiple small jets/arotic bioprosthetic valve sits well in position, mild AI/ thickened TV w/ mild  reurg./mild LA  . TEE WITHOUT CARDIOVERSION N/A 10/22/2018   Procedure: TRANSESOPHAGEAL ECHOCARDIOGRAM (TEE);  Surgeon: [name], MD;  Location: [facility] ENDOSCOPY;  Service: Cardiovascular;  Laterality: N/A;  . TEE WITHOUT CARDIOVERSION N/A 11/25/2018   Procedure: TRANSESOPHAGEAL ECHOCARDIOGRAM (TEE);  Surgeon: [name], MD;  Location: [facility];  Service: Open Heart Surgery;  Laterality: N/A;  . TRANSCATHETER AORTIC VALVE REPLACEMENT, TRANSFEMORAL N/A 11/25/2018   Procedure: TRANSCATHETER AORTIC VALVE REPLACEMENT, TRANSFEMORAL;  Surgeon: [name], MD;  Location: [facility];  Service: Open Heart Surgery;  Laterality: N/A;  . TRANSTHORACIC ECHOCARDIOGRAM  03-23-2016   dr Irish Lack   EF 55-60%, reduced contribution atrial contraction to ventricular filling, due to increased ventricular diastolic pressure or atrial contratile dysfunction/  bioprosthetic AV w/ mod. regurg. (valve area 1.97cm^2)/ mild dilated ascending aorta/ mild thicken MV normal function annular ring prosthesis/severe LAE & mod. to sev.RAE/ PASP 96mmHg/ mild TR/ ventricle septal motion show paradox  . TRANSURETHRAL RESECTION OF BLADDER TUMOR N/A 02/13/2016   Procedure: TRANSURETHRAL RESECTION OF BLADDER TUMOR (TURBT);  Surgeon: Nickie Retort, MD;  Location: [facility];  Service: Urology;  Laterality: N/A;  . TRANSURETHRAL RESECTION OF BLADDER TUMOR N/A 09/04/2016   Procedure: TRANSURETHRAL RESECTION OF BLADDER TUMOR (TURBT);  Surgeon: Nickie Retort, MD;  Location: [facility];  Service: Urology;  Laterality: N/A;    There were no vitals filed for this visit.      [facility] PT Assessment - 04/01/19 0001      Assessment   Medical Diagnosis  Gait/balance      Berg Balance Test   Sit to Stand  Able to stand without using hands and stabilize independently    Standing Unsupported  Able to stand safely 2 minutes    Sitting with Back Unsupported but Feet Supported on Floor or Stool  Able to sit  safely and securely 2 minutes    Stand to Sit  Sits safely with minimal use of hands    Transfers  Able to transfer safely, minor use of hands    Standing Unsupported with Eyes Closed  Able to stand 10 seconds with supervision    Standing Unsupported with Feet Together  Able to place feet together independently and stand for 1 minute with supervision    From Standing, Reach Forward with Outstretched Arm  Can reach confidently >25 cm (10")    From Standing Position, Pick up Object from Floor  Able to pick up shoe safely and easily    From Standing Position, Turn to Look Behind Over each Shoulder  Looks behind from both sides and weight shifts well    Turn 360 Degrees  Able to turn 360 degrees safely one side only in 4 seconds or less  Standing Unsupported, Alternately Place Feet on Step/Stool  Able to stand independently and safely and complete 8 steps in 20 seconds    Standing Unsupported, One Foot in Front  Able to plae foot ahead of the other independently and hold 30 seconds    Standing on One Leg  Tries to lift leg/unable to hold 3 seconds but remains standing independently    Total Score  49                   [facility] Adult PT Treatment/Exercise - 04/01/19 0001      High Level Balance   High Level Balance Comments  SLS 10-20 sec X 3 each side, weaving through cones lateral and anterior to posterior, rocker board ant/post and lateral, tandem walk fwd and retro, retested BERG balance test.      Exercises   Other Exercises   treadmill warm up 2.5 MPH 5 min total, last 2 min on 2% incline.  Sit to stands no UE support 2X10, mini lunges with intermitt UE support PRN at counter X 10 bilat, leg press 60 lbs 3X15 bilat                  PT Long Term Goals - 03/16/19 0901      PT LONG TERM GOAL #1   Title  Pt will be I and compliant with HEP. (target for all goals 6 weeks 04/27/19)    Status  New      PT LONG TERM GOAL #2   Title  Pt will improv BERG balance score >50  to show improved balance    Status  New      PT LONG TERM GOAL #3   Title  Pt will improve FGA to at least 24 to show improved balance.    Status  New      PT LONG TERM GOAL #4   Title  Pt will improve gait speed in 20 ft walk test from 6.5 sec to less than 6 sec.    Status  New            Plan - 04/01/19 1109    Clinical Impression Statement  He overall improved with BERG balance score from 46 to 49 however his goal is to score above 50. Progressed strength and dynamic balance today with good tolerance but does still fatique easily. PT will continue to progress as able.    Personal Factors and Comorbidities  Comorbidity 1;Comorbidity 2;Comorbidity 3+    Comorbidities  PMH: CABG 10/29/18, aortic valve replacment (TAVR) 11/2018, bladder and prostate CA,CAD, RTC repair 09/1998 on Lt    Examination-Activity Limitations  Carry;Squat;Lift;Stand;Locomotion Level    Examination-Participation Restrictions  Community Activity;Yard Work;Shop    Stability/Clinical Decision Making  Evolving/Moderate complexity    Rehab Potential  Good    PT Frequency  2x / week    PT Duration  6 weeks    PT Treatment/Interventions  ADLs/Self Care Home Management;Aquatic Therapy;Gait training;Stair training;Functional mobility training;Therapeutic activities;Therapeutic exercise;Balance training;Neuromuscular re-education;Manual techniques;Orthotic Fit/Training;Taping    PT Next Visit Plan  needs higher level balance challenges and endurance.    PT Home Exercise Plan  Access Code: [email or web address] and Agree with Plan of Care  Patient       Patient will benefit from skilled therapeutic intervention in order to improve the following deficits and impairments:  Abnormal gait, Cardiopulmonary status limiting activity, Decreased activity tolerance, Decreased balance, Decreased endurance, Decreased strength, Difficulty walking  Visit  Diagnosis: Other abnormalities of gait and mobility  Difficulty in walking,  not elsewhere classified     Problem List Patient Active Problem List   Diagnosis Date Noted  . History of mitral valve repair 11/26/2018  . Prosthetic valve dysfunction 11/25/2018  . Ectatic abdominal aorta ([place]) 11/25/2018  . 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 History of bladder cancer   . History of pulmonary embolus (PE)   . Acute on chronic diastolic heart failure ([place]) 04/23/2018  . History of aortic valve replacement 04/23/2018  . Atrial fibrillation ([place]) 04/23/2018  . Coronary atherosclerosis of native coronary artery 02/25/2014  . Hyperlipidemia 02/25/2014  . Embolism and thrombosis ([place]) 03/11/2013    Silvestre Mesi 04/01/2019, 11:11 AM  [facility] [address] [place], [name], [address] Phone: [phone]   Fax:  [phone]  Name: Eric [name] MRN: [id number] Date of Birth: 07-04-1929

## 2019-04-07 ENCOUNTER — Other Ambulatory Visit: Payer: Self-pay

## 2019-04-07 ENCOUNTER — Ambulatory Visit: Payer: Medicare Other | Admitting: Physical Therapy

## 2019-04-07 DIAGNOSIS — R2689 Other abnormalities of gait and mobility: Secondary | ICD-10-CM | POA: Diagnosis not present

## 2019-04-07 DIAGNOSIS — R262 Difficulty in walking, not elsewhere classified: Secondary | ICD-10-CM

## 2019-04-07 NOTE — Therapy (Signed)
[facility] [address] [place], [name], [address] Phone: [phone]   Fax:  [phone]  Physical Therapy Treatment  Patient Details  Name: Eric [name] MRN: [id number] Date of Birth: 1929/12/14 Referring Provider (PT): [name], MD (PCP)   Encounter Date: 04/07/2019  PT End of Session - 04/07/19 0927    Visit Number  6    Number of Visits  12    Date for PT Re-Evaluation  04/27/19    Authorization Type  UHC MCR    PT Start Time  0850    PT Stop Time  0930    PT Time Calculation (min)  40 min    Equipment Utilized During Treatment  --   min guard to S prn   Activity Tolerance  Patient tolerated treatment well    Behavior During Therapy  [facility] for tasks assessed/performed       Past Medical History:  Diagnosis Date  . Atrial flutter, paroxysmal ([place])    a. dx 04-02-2014, s/p  successful cardioversion 04-06-2014.  Marland Kitchen Chronic anticoagulation    on Eliquis--  due to recurrent dvt's and pe's  . Dyslipidemia   . History of bladder cancer urologist-  dr [name]   02-13-2016  s/p TURBT per path high grade papillary urothelial carcinoma  . History of DVT (deep vein thrombosis)    2000-- RLE  . History of melanoma excision    left flank;  07/ 2014 left lower leg and right upper chest  . History of prostate cancer    Gleason 6--  s/p  radical prostatectomy 06/ 2000 in [place]---  no recurrence  . History of pulmonary embolus (PE)    2000 & 2005  bilateral  . Hx of valvuloplasty 06/19/2007   a. s/p mitral ring annuloplasty, repair ruptured chordae of P2 flail segments of MV  . S/P aortic valve replacement with bioprosthetic valve    06-19-2007  severe aortic valve stenosis  . 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 Single vessel coronary artery disease   cardiologist-  dr Irish Lack   a. 05/2007 CABG x 1: s/p SVG-OM;  b. 10/2010 Ex MV: EF 72%, inf attenuation w/o ischemia, brief run of PAT with exercise. To  .  Thrombocytopenia ([place])   . Thyroid goiter 2013   nodular  . Wears hearing aid    BILATERAL  . Wears partial dentures    UPPER    Past Surgical History:  Procedure Laterality Date  . AORTIC VALVE REPLACEMENT (AVR)/CORONARY ARTERY BYPASS GRAFTING (CABG)  06-19-2007  dr [name]   SVG to OM1 ;   AVR w/ #23 Mitralflow pericardial and ligation left atrial appendage;  MV repair w/ #28 Sorin 3-D memo Ring Annuloplasty with repair ruptured chordar of P-2 flail segments  . CARDIAC CATHETERIZATION  05-12-2007  dr Leonia Reeves   single vessel 30% ostial LAD,  80% LCx;  severe to critial AS,  moderate MR,  normal LVF, ef 70%,  normal right heart pressures and cardiac outputs,  mild elevated LV end-diastolic pressure    . CARDIOVASCULAR STRESS TEST  11-02-2010  dr Irish Lack   normal nuclear study w/ no ischemia or infarct/scar/  normal LV function and wall motion , ef 72%  . CARDIOVERSION N/A 04/06/2014   Procedure: CARDIOVERSION;  Surgeon: [name], MD;  Location: [facility];  Service: Cardiovascular;  Laterality: N/A;   successful  . CATARACT EXTRACTION W/ INTRAOCULAR LENS  IMPLANT, BILATERAL  2012  approx  . CORONARY  ARTERY BYPASS GRAFT    . RETROPUBIC RADICAL PROSTATECTOMY  06/ 2000  in [place]  . RIGHT/LEFT HEART CATH AND CORONARY/GRAFT ANGIOGRAPHY N/A 10/29/2018   Procedure: RIGHT/LEFT HEART CATH AND CORONARY/GRAFT ANGIOGRAPHY;  Surgeon: [name], MD;  Location: [facility];  Service: Cardiovascular;  Laterality: N/A;  . ROTATOR CUFF REPAIR Left 09/1998  . TEE WITHOUT CARDIOVERSION N/A 04/06/2014   Procedure: TRANSESOPHAGEAL ECHOCARDIOGRAM (TEE);  Surgeon: [name], MD;  Location: [facility] ENDOSCOPY;  Service: Cardiovascular;  Laterality: N/A;  mild focal basa LVH of the septum, ef 50-55%, s/p AV annuloplasty ring, elongated chordae, thickened leaflets, mild to mod. MR, multiple small jets/arotic bioprosthetic valve sits well in position, mild AI/ thickened TV w/ mild  reurg./mild LA  . TEE WITHOUT CARDIOVERSION N/A 10/22/2018   Procedure: TRANSESOPHAGEAL ECHOCARDIOGRAM (TEE);  Surgeon: [name], MD;  Location: [facility] ENDOSCOPY;  Service: Cardiovascular;  Laterality: N/A;  . TEE WITHOUT CARDIOVERSION N/A 11/25/2018   Procedure: TRANSESOPHAGEAL ECHOCARDIOGRAM (TEE);  Surgeon: [name], MD;  Location: [facility];  Service: Open Heart Surgery;  Laterality: N/A;  . TRANSCATHETER AORTIC VALVE REPLACEMENT, TRANSFEMORAL N/A 11/25/2018   Procedure: TRANSCATHETER AORTIC VALVE REPLACEMENT, TRANSFEMORAL;  Surgeon: [name], MD;  Location: [facility];  Service: Open Heart Surgery;  Laterality: N/A;  . TRANSTHORACIC ECHOCARDIOGRAM  03-23-2016   dr Irish Lack   EF 55-60%, reduced contribution atrial contraction to ventricular filling, due to increased ventricular diastolic pressure or atrial contratile dysfunction/  bioprosthetic AV w/ mod. regurg. (valve area 1.97cm^2)/ mild dilated ascending aorta/ mild thicken MV normal function annular ring prosthesis/severe LAE & mod. to sev.RAE/ PASP 70mmHg/ mild TR/ ventricle septal motion show paradox  . TRANSURETHRAL RESECTION OF BLADDER TUMOR N/A 02/13/2016   Procedure: TRANSURETHRAL RESECTION OF BLADDER TUMOR (TURBT);  Surgeon: Nickie Retort, MD;  Location: [facility];  Service: Urology;  Laterality: N/A;  . TRANSURETHRAL RESECTION OF BLADDER TUMOR N/A 09/04/2016   Procedure: TRANSURETHRAL RESECTION OF BLADDER TUMOR (TURBT);  Surgeon: Nickie Retort, MD;  Location: [facility];  Service: Urology;  Laterality: N/A;    There were no vitals filed for this visit.  Subjective Assessment - 04/07/19 0900    Subjective  no complaints or concerns today. Relays he feels his balance is getting a little better.    Pertinent History  PMH: CABG 10/29/18, aortic valve replacment (TAVR) 11/2018, bladder and prostate CA,CAD, RTC repair 09/1998 on Lt    Limitations  Walking;House hold activities;Lifting;Standing     Currently in Pain?  No/denies            [facility] Adult PT Treatment/Exercise - 04/07/19 0001      High Level Balance   High Level Balance Comments  SLS 10-15 sec X 5 each side, rocker board ant/post and lateral, dynamic gait/balance in hall for tandem walk, retro walk, walk with eyes closed, grapevine all  up/down X 2 ea      Exercises   Other Exercises   treadmill warm up 2.5 MPH 5 min total, last 2 min on 2% incline.  Sit to stands no UE support 2X10, leg press 70 lbs 3X15 bilat, stairs up/down X 6 no UE support alternating, Lt ankle DF red X 20 seated, farmers carry with 12 lb kettlebell for one lap 115 ft X 1 carrying in Lt arm and X 1 in right arm        PT Long Term Goals - 03/16/19 0901      PT  LONG TERM GOAL #1   Title  Pt will be I and compliant with HEP. (target for all goals 6 weeks 04/27/19)    Status  New      PT LONG TERM GOAL #2   Title  Pt will improv BERG balance score >50 to show improved balance    Status  New      PT LONG TERM GOAL #3   Title  Pt will improve FGA to at least 24 to show improved balance.    Status  New      PT LONG TERM GOAL #4   Title  Pt will improve gait speed in 20 ft walk test from 6.5 sec to less than 6 sec.    Status  New            Plan - 04/07/19 0930    Clinical Impression Statement  He showed some improvements in Lt ankle dorsiflexion during gait today on treadmill. His balance has been improving but he had more diffculty today with tandem walk, grapevine, and SLS. PT will continue to focus on higher level balance, leg strength, and overall endurance.    Personal Factors and Comorbidities  Comorbidity 1;Comorbidity 2;Comorbidity 3+    Comorbidities  PMH: CABG 10/29/18, aortic valve replacment (TAVR) 11/2018, bladder and prostate CA,CAD, RTC repair 09/1998 on Lt    Examination-Activity Limitations  Carry;Squat;Lift;Stand;Locomotion Level    Examination-Participation Restrictions  Community Activity;Yard Work;Shop     Stability/Clinical Decision Making  Evolving/Moderate complexity    Rehab Potential  Good    PT Frequency  2x / week    PT Duration  6 weeks    PT Treatment/Interventions  ADLs/Self Care Home Management;Aquatic Therapy;Gait training;Stair training;Functional mobility training;Therapeutic activities;Therapeutic exercise;Balance training;Neuromuscular re-education;Manual techniques;Orthotic Fit/Training;Taping    PT Next Visit Plan  needs higher level balance challenges, leg strength and endurance.    PT Home Exercise Plan  Access Code: [email or web address] and Agree with Plan of Care  Patient       Patient will benefit from skilled therapeutic intervention in order to improve the following deficits and impairments:  Abnormal gait, Cardiopulmonary status limiting activity, Decreased activity tolerance, Decreased balance, Decreased endurance, Decreased strength, Difficulty walking  Visit Diagnosis: Other abnormalities of gait and mobility  Difficulty in walking, not elsewhere classified     Problem List Patient Active Problem List   Diagnosis Date Noted  . History of mitral valve repair 11/26/2018  . Prosthetic valve dysfunction 11/25/2018  . Ectatic abdominal aorta ([place]) 11/25/2018  . 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 History of bladder cancer   . History of pulmonary embolus (PE)   . Acute on chronic diastolic heart failure ([place]) 04/23/2018  . History of aortic valve replacement 04/23/2018  . Atrial fibrillation ([place]) 04/23/2018  . Coronary atherosclerosis of native coronary artery 02/25/2014  . Hyperlipidemia 02/25/2014  . Embolism and thrombosis ([place]) 03/11/2013    Silvestre Mesi 04/07/2019, 9:35 AM  [facility] [address] [place], [name], [address] Phone: [phone]   Fax:  [phone]  Name: NILESH ECKLES MRN: [id number] Date of Birth: Jun 20, 1929

## 2019-04-09 ENCOUNTER — Encounter: Payer: Self-pay | Admitting: Physical Therapy

## 2019-04-09 ENCOUNTER — Other Ambulatory Visit: Payer: Self-pay

## 2019-04-09 ENCOUNTER — Ambulatory Visit: Payer: Medicare Other | Admitting: Physical Therapy

## 2019-04-09 DIAGNOSIS — R2689 Other abnormalities of gait and mobility: Secondary | ICD-10-CM | POA: Diagnosis not present

## 2019-04-09 DIAGNOSIS — R262 Difficulty in walking, not elsewhere classified: Secondary | ICD-10-CM

## 2019-04-09 NOTE — Telephone Encounter (Signed)
   Primary Cardiologist: [name], MD  Chart reviewed as part of pre-operative protocol coverage. Simple dental extractions are considered low risk procedures per guidelines and generally do not require any specific cardiac clearance. It is also generally accepted that for simple extractions and dental cleanings, there is no need to interrupt blood thinner therapy.   SBE prophylaxis is required for the patient. 4 tablets of 500mg  of amoxicillin (total of 2000mg ) taken one time 1 hour prior to the dental procedure.   I will route this recommendation to the requesting party via Epic fax function and remove from pre-op pool.  Please call with questions. For 1-2 teeth extraction, we do not recommend holding Eliquis. If 3 or more teeth need to be extraction, please let us know, our clinical pharmacist likely will recommend hold eliquis for 1 day.  [place], [place] 04/09/2019, 2:50 PM

## 2019-04-09 NOTE — Telephone Encounter (Signed)
1. What dental office are you calling from  Dr Temple Pacini  2. What is your office phone number? [phone]   3. What is your fax number? [phone] 4.  5. What type of procedure is the patient having performed?  Extraction   6. What date is procedure scheduled or is the patient there now? Pending (if the patient is at the dentist's office question goes to their cardiologist if he/she is in the office.  If not, question should go to the DOD).   7. What is your question (ex. Antibiotics prior to procedure, holding medication-we need to know how long dentist wants pt to hold med)?  How many days prior to extraction should pt stop his Eliquis and how many days after extraction can he start back on Eliquis  They also wants the last reading of his INR or PT

## 2019-04-10 NOTE — Therapy (Signed)
[facility] [address] [place], [name], [address] Phone: [phone]   Fax:  [phone]  Physical Therapy Treatment  Patient Details  Name: Eric [name] MRN: [id number] Date of Birth: 07/14/1929 Referring Provider (PT): [name], MD (PCP)   Encounter Date: 04/09/2019  PT End of Session - 04/09/19 0852    Visit Number  7    Number of Visits  12    Date for PT Re-Evaluation  04/27/19    Authorization Type  UHC MCR    PT Start Time  0849    PT Stop Time  0930    PT Time Calculation (min)  41 min    Equipment Utilized During Treatment  Gait belt    Activity Tolerance  Patient tolerated treatment well    Behavior During Therapy  [facility] for tasks assessed/performed       Past Medical History:  Diagnosis Date  . Atrial flutter, paroxysmal ([place])    a. dx 04-02-2014, s/p  successful cardioversion 04-06-2014.  Marland Kitchen Chronic anticoagulation    on Eliquis--  due to recurrent dvt's and pe's  . Dyslipidemia   . History of bladder cancer urologist-  dr [name]   02-13-2016  s/p TURBT per path high grade papillary urothelial carcinoma  . History of DVT (deep vein thrombosis)    2000-- RLE  . History of melanoma excision    left flank;  07/ 2014 left lower leg and right upper chest  . History of prostate cancer    Gleason 6--  s/p  radical prostatectomy 06/ 2000 in [place]---  no recurrence  . History of pulmonary embolus (PE)    2000 & 2005  bilateral  . Hx of valvuloplasty 06/19/2007   a. s/p mitral ring annuloplasty, repair ruptured chordae of P2 flail segments of MV  . S/P aortic valve replacement with bioprosthetic valve    06-19-2007  severe aortic valve stenosis  . 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 Single vessel coronary artery disease   cardiologist-  dr Irish Lack   a. 05/2007 CABG x 1: s/p SVG-OM;  b. 10/2010 Ex MV: EF 72%, inf attenuation w/o ischemia, brief run of PAT with exercise. To  .  Thrombocytopenia ([place])   . Thyroid goiter 2013   nodular  . Wears hearing aid    BILATERAL  . Wears partial dentures    UPPER    Past Surgical History:  Procedure Laterality Date  . AORTIC VALVE REPLACEMENT (AVR)/CORONARY ARTERY BYPASS GRAFTING (CABG)  06-19-2007  dr [name]   SVG to OM1 ;   AVR w/ #23 Mitralflow pericardial and ligation left atrial appendage;  MV repair w/ #28 Sorin 3-D memo Ring Annuloplasty with repair ruptured chordar of P-2 flail segments  . CARDIAC CATHETERIZATION  05-12-2007  dr Leonia Reeves   single vessel 30% ostial LAD,  80% LCx;  severe to critial AS,  moderate MR,  normal LVF, ef 70%,  normal right heart pressures and cardiac outputs,  mild elevated LV end-diastolic pressure    . CARDIOVASCULAR STRESS TEST  11-02-2010  dr Irish Lack   normal nuclear study w/ no ischemia or infarct/scar/  normal LV function and wall motion , ef 72%  . CARDIOVERSION N/A 04/06/2014   Procedure: CARDIOVERSION;  Surgeon: [name], MD;  Location: [facility];  Service: Cardiovascular;  Laterality: N/A;   successful  . CATARACT EXTRACTION W/ INTRAOCULAR LENS  IMPLANT, BILATERAL  2012  approx  . CORONARY ARTERY BYPASS GRAFT    .  RETROPUBIC RADICAL PROSTATECTOMY  06/ 2000  in [place]  . RIGHT/LEFT HEART CATH AND CORONARY/GRAFT ANGIOGRAPHY N/A 10/29/2018   Procedure: RIGHT/LEFT HEART CATH AND CORONARY/GRAFT ANGIOGRAPHY;  Surgeon: [name], MD;  Location: [facility];  Service: Cardiovascular;  Laterality: N/A;  . ROTATOR CUFF REPAIR Left 09/1998  . TEE WITHOUT CARDIOVERSION N/A 04/06/2014   Procedure: TRANSESOPHAGEAL ECHOCARDIOGRAM (TEE);  Surgeon: [name], MD;  Location: [facility] ENDOSCOPY;  Service: Cardiovascular;  Laterality: N/A;  mild focal basa LVH of the septum, ef 50-55%, s/p AV annuloplasty ring, elongated chordae, thickened leaflets, mild to mod. MR, multiple small jets/arotic bioprosthetic valve sits well in position, mild AI/ thickened TV w/ mild  reurg./mild LA  . TEE WITHOUT CARDIOVERSION N/A 10/22/2018   Procedure: TRANSESOPHAGEAL ECHOCARDIOGRAM (TEE);  Surgeon: [name], MD;  Location: [facility] ENDOSCOPY;  Service: Cardiovascular;  Laterality: N/A;  . TEE WITHOUT CARDIOVERSION N/A 11/25/2018   Procedure: TRANSESOPHAGEAL ECHOCARDIOGRAM (TEE);  Surgeon: [name], MD;  Location: [facility];  Service: Open Heart Surgery;  Laterality: N/A;  . TRANSCATHETER AORTIC VALVE REPLACEMENT, TRANSFEMORAL N/A 11/25/2018   Procedure: TRANSCATHETER AORTIC VALVE REPLACEMENT, TRANSFEMORAL;  Surgeon: [name], MD;  Location: [facility];  Service: Open Heart Surgery;  Laterality: N/A;  . TRANSTHORACIC ECHOCARDIOGRAM  03-23-2016   dr Irish Lack   EF 55-60%, reduced contribution atrial contraction to ventricular filling, due to increased ventricular diastolic pressure or atrial contratile dysfunction/  bioprosthetic AV w/ mod. regurg. (valve area 1.97cm^2)/ mild dilated ascending aorta/ mild thicken MV normal function annular ring prosthesis/severe LAE & mod. to sev.RAE/ PASP 53mmHg/ mild TR/ ventricle septal motion show paradox  . TRANSURETHRAL RESECTION OF BLADDER TUMOR N/A 02/13/2016   Procedure: TRANSURETHRAL RESECTION OF BLADDER TUMOR (TURBT);  Surgeon: Nickie Retort, MD;  Location: [facility];  Service: Urology;  Laterality: N/A;  . TRANSURETHRAL RESECTION OF BLADDER TUMOR N/A 09/04/2016   Procedure: TRANSURETHRAL RESECTION OF BLADDER TUMOR (TURBT);  Surgeon: Nickie Retort, MD;  Location: [facility];  Service: Urology;  Laterality: N/A;    There were no vitals filed for this visit.  Subjective Assessment - 04/09/19 0851    Subjective  No new complaints. No falls. Did have some left leg stiffenss this morning, "I could hardly straighten it out", reporting cramping as well.    Pertinent History  PMH: CABG 10/29/18, aortic valve replacment (TAVR) 11/2018, bladder and prostate CA,CAD, RTC repair 09/1998 on Lt    Limitations   Walking;House hold activities;Lifting;Standing    How long can you walk comfortably?  can walk through supermarket, 2-3 blocks.    Patient Stated Goals  improve balance    Currently in Pain?  No/denies           [facility] Adult PT Treatment/Exercise - 04/09/19 0853      Ambulation/Gait   Ambulation/Gait  Yes    Ambulation/Gait Assistance  5: Supervision;4: Min guard    Ambulation Distance (Feet)  --   around gym with session   Assistive device  None    Gait Pattern  Step-through pattern;Right foot flat;Left foot flat    Ambulation Surface  Level;Indoor    Gait Comments  gait around track for 2 laps working on scanning all directions and speed changes, min guard assist for safety, no balance loss noted.        High Level Balance   High Level Balance Activities  Marching forwards;Marching backwards;Tandem walking   tandem/heel/toe walk fwd.bwd   High Level Balance  Comments  on blue mat in parallel bars- 3-4 laps each with intermittent touch to bars for balance. min guard to min assist. cues on posture and ex form/technique.       Exercises   Exercises  Other Exercises    Other Exercises   elliptical: level 1.0 for 1 min fwd, then attempted 1 mintue backwards however stopped at 30 sec's due to knee pain.          Balance Exercises - 04/09/19 0908      Balance Exercises: Standing   Rockerboard  Anterior/posterior;Lateral;Head turns;EO;EC;30 seconds;10 reps    Balance Beam  standing across blue foam beam: alternating heel taps fwd to floor/back onto beam, then alternating toe taps bwd/back onto beam for 10 reps each. pt required UE support on bars with min guard assist, up to mod assist if not using the bars for balance. cues for increased step length/step height as well.       Balance Exercises: Standing   Rebounder Limitations  performed both ways on balance board with no UE support: rocking the boad with emphasis on tall posture with EO, progressing to EC; then holding the board  steady for: with EO alternating UE raises, progressing to bil UE raises. then with EC no head movements, progressing to EC  head movements left<>right, then up<>down. min guard to min assist for balance with cues on posture and weight shifting to assist with balance recovery.              PT Long Term Goals - 03/16/19 0901      PT LONG TERM GOAL #1   Title  Pt will be I and compliant with HEP. (target for all goals 6 weeks 04/27/19)    Status  New      PT LONG TERM GOAL #2   Title  Pt will improv BERG balance score >50 to show improved balance    Status  New      PT LONG TERM GOAL #3   Title  Pt will improve FGA to at least 24 to show improved balance.    Status  New      PT LONG TERM GOAL #4   Title  Pt will improve gait speed in 20 ft walk test from 6.5 sec to less than 6 sec.    Status  New            Plan - 04/09/19 0853    Clinical Impression Statement  Today's skilled session continued to focus on LE strengthening and balance reactions. Pt continues to be challenged by complaint surfaces without and with vision removed. Rest breaks needed due to fatigue, otherwise no issues noted or reported in session. The pt is progressing toward goals and should benefit from continued PT to progress toward unmet goals.    Personal Factors and Comorbidities  Comorbidity 1;Comorbidity 2;Comorbidity 3+    Comorbidities  PMH: CABG 10/29/18, aortic valve replacment (TAVR) 11/2018, bladder and prostate CA,CAD, RTC repair 09/1998 on Lt    Examination-Activity Limitations  Carry;Squat;Lift;Stand;Locomotion Level    Examination-Participation Restrictions  Community Activity;Yard Work;Shop    Stability/Clinical Decision Making  Evolving/Moderate complexity    Rehab Potential  Good    PT Frequency  2x / week    PT Duration  6 weeks    PT Treatment/Interventions  ADLs/Self Care Home Management;Aquatic Therapy;Gait training;Stair training;Functional mobility training;Therapeutic  activities;Therapeutic exercise;Balance training;Neuromuscular re-education;Manual techniques;Orthotic Fit/Training;Taping    PT Next Visit Plan  needs higher level balance challenges,  leg strength and endurance.    PT Home Exercise Plan  Access Code: [email or web address] and Agree with Plan of Care  Patient       Patient will benefit from skilled therapeutic intervention in order to improve the following deficits and impairments:  Abnormal gait, Cardiopulmonary status limiting activity, Decreased activity tolerance, Decreased balance, Decreased endurance, Decreased strength, Difficulty walking  Visit Diagnosis: Other abnormalities of gait and mobility  Difficulty in walking, not elsewhere classified     Problem List Patient Active Problem List   Diagnosis Date Noted  . History of mitral valve repair 11/26/2018  . Prosthetic valve dysfunction 11/25/2018  . Ectatic abdominal aorta ([place]) 11/25/2018  . 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 History of bladder cancer   . History of pulmonary embolus (PE)   . Acute on chronic diastolic heart failure ([place]) 04/23/2018  . History of aortic valve replacement 04/23/2018  . Atrial fibrillation ([place]) 04/23/2018  . Coronary atherosclerosis of native coronary artery 02/25/2014  . Hyperlipidemia 02/25/2014  . Embolism and thrombosis ([place]) 03/11/2013    Willow Ora, PTA, [facility] [address] [place], [place] [address] [phone] 04/10/19, 9:15 AM   Name: Eric [name] MRN: [id number] Date of Birth: 05-24-1929

## 2019-04-14 ENCOUNTER — Ambulatory Visit: Payer: Medicare Other | Admitting: Physical Therapy

## 2019-04-14 ENCOUNTER — Other Ambulatory Visit: Payer: Self-pay

## 2019-04-14 DIAGNOSIS — R262 Difficulty in walking, not elsewhere classified: Secondary | ICD-10-CM

## 2019-04-14 DIAGNOSIS — R2689 Other abnormalities of gait and mobility: Secondary | ICD-10-CM

## 2019-04-14 NOTE — Therapy (Signed)
[facility] [address] [place], [name], [address] Phone: [phone]   Fax:  [phone]  Physical Therapy Treatment  Patient Details  Name: Eric [name] MRN: [id number] Date of Birth: [birth year] Referring Provider (PT): [name], MD (PCP)   Encounter Date: 04/14/2019  PT End of Session - 04/14/19 0941    Visit Number  8    Number of Visits  12    Date for PT Re-Evaluation  04/27/19    Authorization Type  [employer] MCR    PT Start Time  0855    PT Stop Time  0935    PT Time Calculation (min)  40 min    Equipment Utilized During Treatment  Gait belt    Activity Tolerance  Patient tolerated treatment well    Behavior During Therapy  [facility] for tasks assessed/performed       Past Medical History:  Diagnosis Date  . Atrial flutter, paroxysmal ([place])    a. dx 04-02-2014, s/p  successful cardioversion 04-06-2014.  Marland Kitchen Chronic anticoagulation    on Eliquis--  due to recurrent dvt's and pe's  . Dyslipidemia   . History of bladder cancer urologist-  dr [name]   02-13-2016  s/p TURBT per path high grade papillary urothelial carcinoma  . History of DVT (deep vein thrombosis)    2000-- RLE  . History of melanoma excision    left flank;  07/ 2014 left lower leg and right upper chest  . History of prostate cancer    Gleason 6--  s/p  radical prostatectomy 06/ 2000 in [place]---  no recurrence  . History of pulmonary embolus (PE)    2000 & 2005  bilateral  . Hx of valvuloplasty 06/19/2007   a. s/p mitral ring annuloplasty, repair ruptured chordae of P2 flail segments of MV  . S/P aortic valve replacement with bioprosthetic valve    06-19-2007  severe aortic valve stenosis  . 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 Single vessel coronary artery disease   cardiologist-  dr Irish Lack   a. 05/2007 CABG x 1: s/p SVG-OM;  b. 10/2010 Ex MV: EF 72%, inf attenuation w/o ischemia, brief run of PAT with exercise. To  .  Thrombocytopenia ([place])   . Thyroid goiter 2013   nodular  . Wears hearing aid    BILATERAL  . Wears partial dentures    UPPER    Past Surgical History:  Procedure Laterality Date  . AORTIC VALVE REPLACEMENT (AVR)/CORONARY ARTERY BYPASS GRAFTING (CABG)  06-19-2007  dr [name]   SVG to OM1 ;   AVR w/ #23 Mitralflow pericardial and ligation left atrial appendage;  MV repair w/ #28 Sorin 3-D memo Ring Annuloplasty with repair ruptured chordar of P-2 flail segments  . CARDIAC CATHETERIZATION  05-12-2007  dr Leonia Reeves   single vessel 30% ostial LAD,  80% LCx;  severe to critial AS,  moderate MR,  normal LVF, ef 70%,  normal right heart pressures and cardiac outputs,  mild elevated LV end-diastolic pressure    . CARDIOVASCULAR STRESS TEST  11-02-2010  dr Irish Lack   normal nuclear study w/ no ischemia or infarct/scar/  normal LV function and wall motion , ef 72%  . CARDIOVERSION N/A 04/06/2014   Procedure: CARDIOVERSION;  Surgeon: [name], MD;  Location: [facility];  Service: Cardiovascular;  Laterality: N/A;   successful  . CATARACT EXTRACTION W/ INTRAOCULAR LENS  IMPLANT, BILATERAL  2012  approx  . CORONARY ARTERY BYPASS GRAFT    .  RETROPUBIC RADICAL PROSTATECTOMY  06/ 2000  in [place], [place]  . RIGHT/LEFT HEART CATH AND CORONARY/GRAFT ANGIOGRAPHY N/A 10/29/2018   Procedure: RIGHT/LEFT HEART CATH AND CORONARY/GRAFT ANGIOGRAPHY;  Surgeon: [name], MD;  Location: [facility];  Service: Cardiovascular;  Laterality: N/A;  . ROTATOR CUFF REPAIR Left 09/1998  . TEE WITHOUT CARDIOVERSION N/A 04/06/2014   Procedure: TRANSESOPHAGEAL ECHOCARDIOGRAM (TEE);  Surgeon: [name], MD;  Location: [facility] ENDOSCOPY;  Service: Cardiovascular;  Laterality: N/A;  mild focal basa LVH of the septum, ef 50-55%, s/p AV annuloplasty ring, elongated chordae, thickened leaflets, mild to mod. MR, multiple small jets/arotic bioprosthetic valve sits well in position, mild AI/ thickened TV w/ mild  reurg./mild LA  . TEE WITHOUT CARDIOVERSION N/A 10/22/2018   Procedure: TRANSESOPHAGEAL ECHOCARDIOGRAM (TEE);  Surgeon: [name], MD;  Location: [facility] ENDOSCOPY;  Service: Cardiovascular;  Laterality: N/A;  . TEE WITHOUT CARDIOVERSION N/A 11/25/2018   Procedure: TRANSESOPHAGEAL ECHOCARDIOGRAM (TEE);  Surgeon: [name], MD;  Location: [facility];  Service: Open Heart Surgery;  Laterality: N/A;  . TRANSCATHETER AORTIC VALVE REPLACEMENT, TRANSFEMORAL N/A 11/25/2018   Procedure: TRANSCATHETER AORTIC VALVE REPLACEMENT, TRANSFEMORAL;  Surgeon: [name], MD;  Location: [employer] [facility];  Service: Open Heart Surgery;  Laterality: N/A;  . TRANSTHORACIC ECHOCARDIOGRAM  03-23-2016   dr Irish Lack   EF 55-60%, reduced contribution atrial contraction to ventricular filling, due to increased ventricular diastolic pressure or atrial contratile dysfunction/  bioprosthetic AV w/ mod. regurg. (valve area 1.97cm^2)/ mild dilated ascending aorta/ mild thicken MV normal function annular ring prosthesis/severe LAE & mod. to sev.RAE/ PASP 63mmHg/ mild TR/ ventricle septal motion show paradox  . TRANSURETHRAL RESECTION OF BLADDER TUMOR N/A 02/13/2016   Procedure: TRANSURETHRAL RESECTION OF BLADDER TUMOR (TURBT);  Surgeon: Nickie Retort, MD;  Location: [facility];  Service: Urology;  Laterality: N/A;  . TRANSURETHRAL RESECTION OF BLADDER TUMOR N/A 09/04/2016   Procedure: TRANSURETHRAL RESECTION OF BLADDER TUMOR (TURBT);  Surgeon: Nickie Retort, MD;  Location: [facility];  Service: Urology;  Laterality: N/A;    There were no vitals filed for this visit.  Subjective Assessment - 04/14/19 0925    Subjective  NO complaints, no falls, has tried changing up his shoes to see if this helps with his balance    Pertinent History  PMH: CABG 10/29/18, aortic valve replacment (TAVR) 11/2018, bladder and prostate CA,CAD, RTC repair 09/1998 on Lt    Limitations  Walking;House hold  activities;Lifting;Standing    How long can you stand comfortably?  20    How long can you walk comfortably?  can walk through supermarket, 2-3 blocks.    Patient Stated Goals  improve balance    Currently in Pain?  No/denies                       [facility] Adult PT Treatment/Exercise - 04/14/19 0001      High Level Balance   High Level Balance Comments  tandem walk, retro tandem walk, rocker board A-P, and lateral, balance on bosu, sidestepping over hurldes, marching over hurldles. walking one lap around track to his right while tossing ball, walking one lap around track to his Lt while tossing ball. Overall supervision to min A for balance exercises      Exercises   Other Exercises   Treadmill warm up 2 MPH for 6 min up 3% incline. Leg press 80 lbs 3X10 reps bilat, walking up/down stairs X 5 reps no UE  support reciprocal step pattern               PT Short Term Goals - 04/14/19 0943      PT SHORT TERM GOAL #1   Title  Pt will be I and compliant with initial HEP. 4 weeks 04/15/19    Status  Achieved      PT SHORT TERM GOAL #2   Title  Pt will improve BERG balance score to at least 48. 4 weeks 03/15/19    Status  On-going        PT Long Term Goals - 04/14/19 0943      PT LONG TERM GOAL #1   Title  Pt will be I and compliant with HEP. (target for all goals 6 weeks 04/27/19)    Status  On-going      PT LONG TERM GOAL #2   Title  Pt will improv BERG balance score >50 to show improved balance    Status  On-going      PT LONG TERM GOAL #3   Title  Pt will improve FGA to at least 24 to show improved balance.    Status  On-going      PT LONG TERM GOAL #4   Title  Pt will improve gait speed in 20 ft walk test from 6.5 sec to less than 6 sec.    Status  On-going            Plan - 04/14/19 [id number]    Clinical Impression Statement  No rest breaks needed today, he showed improved balance with high level balance challenges but does still require min A at  times. PT will continue to progress as able    Personal Factors and Comorbidities  Comorbidity 1;Comorbidity 2;Comorbidity 3+    Comorbidities  PMH: CABG 10/29/18, aortic valve replacment (TAVR) 11/2018, bladder and prostate CA,CAD, RTC repair 09/1998 on Lt    Examination-Activity Limitations  Carry;Squat;Lift;Stand;Locomotion Level    Examination-Participation Restrictions  Community Activity;Yard Work;Shop    Stability/Clinical Decision Making  Evolving/Moderate complexity    Rehab Potential  Good    PT Frequency  2x / week    PT Duration  6 weeks    PT Treatment/Interventions  ADLs/Self Care Home Management;Aquatic Therapy;Gait training;Stair training;Functional mobility training;Therapeutic activities;Therapeutic exercise;Balance training;Neuromuscular re-education;Manual techniques;Orthotic Fit/Training;Taping    PT Next Visit Plan  needs higher level balance challenges, leg strength and endurance.    PT Home Exercise Plan  Access Code: [email or web address] and Agree with Plan of Care  Patient       Patient will benefit from skilled therapeutic intervention in order to improve the following deficits and impairments:  Abnormal gait, Cardiopulmonary status limiting activity, Decreased activity tolerance, Decreased balance, Decreased endurance, Decreased strength, Difficulty walking  Visit Diagnosis: Other abnormalities of gait and mobility  Difficulty in walking, not elsewhere classified     Problem List Patient Active Problem List   Diagnosis Date Noted  . History of mitral valve repair 11/26/2018  . Prosthetic valve dysfunction 11/25/2018  . Ectatic abdominal aorta ([place]) 11/25/2018  . 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 History of bladder cancer   . History of pulmonary embolus (PE)   . Acute on chronic diastolic heart failure ([place]) 04/23/2018  . History of aortic valve replacement 04/23/2018  . Atrial fibrillation ([place]) 04/23/2018  . Coronary atherosclerosis of native  coronary artery 02/25/2014  . Hyperlipidemia 02/25/2014  . Embolism and thrombosis ([place]) 03/11/2013    Marrianne Mood  ,PT,DPT 04/14/2019, 9:45 AM  [facility] [address] [place], [name], [address] Phone: [phone]   Fax:  [phone]  Name: Eric [name] MRN: [id number] Date of Birth: Jan 05, 1930

## 2019-04-15 ENCOUNTER — Telehealth: Payer: Self-pay | Admitting: Interventional Cardiology

## 2019-04-15 NOTE — Telephone Encounter (Signed)
Called and spoke to patient's wife. Made her aware that for 1 tooth extraction that we typically do not recommend holding eliquis. Patient has had some bleeding issues in the past. They will let us know if this becomes an issue after the extraction.

## 2019-04-15 NOTE — Telephone Encounter (Signed)
New Message  Patient states that he is supposed to be having a tooth pulled on Monday and patient was told already that the Eliquis does not need to be stopped, but patient just wants to call and make sure. Please give patient a call back to discuss/confirm at [phone].

## 2019-04-21 ENCOUNTER — Ambulatory Visit: Payer: Medicare Other | Admitting: Interventional Cardiology

## 2019-04-21 ENCOUNTER — Ambulatory Visit: Payer: Medicare Other | Admitting: Physical Therapy

## 2019-04-23 ENCOUNTER — Ambulatory Visit: Payer: Medicare Other | Admitting: Physical Therapy

## 2019-04-24 NOTE — Progress Notes (Signed)
Cardiology Office Note   Date:  04/27/2019   ID:  Eric [name], DOB Sep 10, 1929, MRN [id number]  PCP:  [name], MD    No chief complaint on file.  S/p TAVR  Wt Readings from Last 3 Encounters:  04/27/19 187 lb (84.8 kg)  02/25/19 184 lb 15.5 oz (83.9 kg)  01/01/19 181 lb 14.1 oz (82.5 kg)       History of Present Illness: Eric [name] is a 85 y.o. male   with severe AI.   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  Hehas had an aortic valve replacement , MV repair and single vessel bypass in 2009. Hehad a negative stress test in 2011. No chest pain prior to valve replacement surgery. He did have SHOB.   In late 2015, his heart rate was noted to be elevated at the time of a tooth extraction. He was found to be in atrial flutter. He underwent successful cardioversion. He notes that during the time he was out of rhythm, he had a cough. This got better after the cardioversion.   He went to [place] in February 2016 for about 3 weeks and did well on the NOAC. Less bleeding and bruising on Eliquis compared to his experience Coumadin. He then changed from Coumadin to Eliquisto treat multiple DVT and PE.  Hehad atooth extraction. Whenhe stayed on his anticoagulation, he had significant bleeding issues at that time.THe next time,he cameoff of the Eliquis.   Inlate 2017, he had several instances of blood clots in his urine. He underwent cystoscopy in the urology clinic after holding out was for 4 days. Thisshowed a malignancy. He had surgery and tolerated this well.  He has noticed more edema in his right leg since surgery back in 2017.It persists.  In late 2019, he was in [place] and was volume overloaded and short of breath. He was given IV Lasix and diuresed well. He was kept overnight. He was also found to be in AFib. He dd not feel Palpitations.   They were eating out all of the time. He had stopped his Lasix with his  bladder cancer. Echo showed normal LV function and 3+ AI.  In early 2020,He had a procedure on the left ear. It was bleeding and it was cauterized. 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   I spoke to his PMD, Dr. Laurann Montana in 07/2018. His labs revealed concern for low grade hemolysis. TEE was considered but the patient was in [place], and given the COVID outbreak, the procedure was postponed.   He required repeat surgical procedure on the squamous cell area.He had some bleeding post procedure and required more stitches. He required a skin graft.   TEE was done in 09/2018: The left ventricle has low normal systolic function, with an ejection fraction of 50-55%. Left ventricular diastolic function could not be evaluated. No evidence of left ventricular regional wall motion abnormalities. 2. The right ventricle has normal systolc function. The cavity was normal. There is no increase in right ventricular wall thickness. 3. Left atrial size was mildly dilated. 4. Right atrial size was mildly dilated. 5. Moderately thickened tricuspid valve leaflets. 6. 60m Sorin mitral valve repair annular ring intact. 7. 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 8. A 28 Mitralflow porcine bioprosthesis valve is present in the aortic position. Echo findings are consistent with intravalvular regurgitation of the aortic prosthesis. 9. Aortic valve regurgitation is severe by color flow Doppler. Mild stenosis of the  aortic valve. 10. No vegetation on the aortic valve. 11. The aortic root is normal in size and structure. 12. Previously ligated left atrial appendage. 13. Severe bioprosthetic aortic valve regurgitation.  D/w Dr. [name] with plans for TAVR w/u.  POst TAVR f/u showed: "He wasevaluated by the multidisciplinary valve teamand underwentsuccessfulvalve in valveTAVR with a16m Edwards Sapien 3 THV via the TF approach on 11/25/18.Post  operative echoshowed EF 50-55%, severe MAC with mild MS, mild-mod TR and normally functioning TAVR with mean gradient 10.539mg and no PVL. His BP was noted to be elevated post operatively and he was started on Losartan 2523maily. He was discharged on home Eliquis and aspirin. Lasix decreased from 77m57mily to 20mg34mly.   Follow up labs showed increase in creat 1.05--> 1.44. Lasix decreased to 20mg 63my other day. Bilirubin was still elevated and this was sent to his PCP for review."  Since the last visit, he has had some dental issues.  He had to have a tooth pulled in 03/2019.  Eliquis was continued.    He has been walking regularly.  He is in a balance class also, 2x/week.  No falls.   Denies : Chest pain. Dizziness. Leg edema. Nitroglycerin use. Orthopnea. Palpitations. Paroxysmal nocturnal dyspnea. Shortness of breath. Syncope.    He has a recumbent bike.      Past Medical History:  Diagnosis Date  . Atrial flutter, paroxysmal (HCC)  [place]. dx 04-02-2014, s/p  successful cardioversion 04-06-2014.  . ChroMarland Kitchenic anticoagulation    on Eliquis--  due to recurrent dvt's and pe's  . Dyslipidemia   . 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 History of DVT (deep vein thrombosis)    2000-- RLE  . History of melanoma excision    left flank;  07/ 2014 left lower leg and right upper chest  . History of prostate cancer    Gleason 6--  s/p  radical prostatectomy 06/ 2000 in [place]---  no recurrence  . History of pulmonary embolus (PE)    2000 & 2005  bilateral  . Hx of valvuloplasty 06/19/2007   a. s/p mitral ring annuloplasty, repair ruptured chordae of P2 flail segments of MV  . S/P aortic valve replacement with bioprosthetic valve    06-19-2007  severe aortic valve stenosis  . 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 Single vessel coronary artery disease   cardiologist-  dr varanaIrish Lack1/2009 CABG  x 1: s/p SVG-OM;  b. 10/2010 Ex MV: EF 72%, inf attenuation w/o ischemia, brief run of PAT with exercise. To  . Thrombocytopenia (HCC)  KevinThyroid goiter 2013   nodular  . Wears hearing aid    BILATERAL  . Wears partial dentures    UPPER    Past Surgical History:  Procedure Laterality Date  . AORTIC VALVE REPLACEMENT (AVR)/CORONARY ARTERY BYPASS GRAFTING (CABG)  06-19-2007  dr bartleCyndia Bent to OM1 ;   AVR w/ #23 Mitralflow pericardial and ligation left atrial appendage;  MV repair w/ #28 Sorin 3-D memo Ring Annuloplasty with repair ruptured chordar of P-2 flail segments  . CARDIAC CATHETERIZATION  05-12-2007  dr edmundLeonia Reevesgle vessel 30% ostial LAD,  80% LCx;  severe to critial AS,  moderate MR,  normal LVF, ef 70%,  normal right heart pressures and cardiac outputs,  mild elevated LV end-diastolic pressure    .  CARDIOVASCULAR STRESS TEST  11-02-2010  dr Irish Lack   normal nuclear study w/ no ischemia or infarct/scar/  normal LV function and wall motion , ef 72%  . CARDIOVERSION N/A 04/06/2014   Procedure: CARDIOVERSION;  Surgeon: [name], MD;  Location: [facility];  Service: Cardiovascular;  Laterality: N/A;   successful  . CATARACT EXTRACTION W/ INTRAOCULAR LENS  IMPLANT, BILATERAL  2012  approx  . CORONARY ARTERY BYPASS GRAFT    . RETROPUBIC RADICAL PROSTATECTOMY  06/ 2000  in [place], [place]  . RIGHT/LEFT HEART CATH AND CORONARY/GRAFT ANGIOGRAPHY N/A 10/29/2018   Procedure: RIGHT/LEFT HEART CATH AND CORONARY/GRAFT ANGIOGRAPHY;  Surgeon: [name], MD;  Location: [facility];  Service: Cardiovascular;  Laterality: N/A;  . ROTATOR CUFF REPAIR Left 09/1998  . TEE WITHOUT CARDIOVERSION N/A 04/06/2014   Procedure: TRANSESOPHAGEAL ECHOCARDIOGRAM (TEE);  Surgeon: [name], MD;  Location[facility];  Service: Cardiovascular;  Laterality: N/A;  mild focal basa LVH of the septum, ef 50-55%, s/p AV annuloplasty ring, elongated chordae, thickened leaflets, mild to  mod. MR, multiple small jets/arotic bioprosthetic valve sits well in position, mild AI/ thickened TV w/ mild reurg./mild LA  . TEE WITHOUT CARDIOVERSION N/A 10/22/2018   Procedure: TRANSESOPHAGEAL ECHOCARDIOGRAM (TEE);  Surgeon: [name], MD;  Location[facility];  Service: Cardiovascular;  Laterality: N/A;  . TEE WITHOUT CARDIOVERSION N/A 11/25/2018   Procedure: TRANSESOPHAGEAL ECHOCARDIOGRAM (TEE);  Surgeon: [name], MD;  Location: [facility];  Service: Open Heart Surgery;  Laterality: N/A;  . TRANSCATHETER AORTIC VALVE REPLACEMENT, TRANSFEMORAL N/A 11/25/2018   Procedure: TRANSCATHETER AORTIC VALVE REPLACEMENT, TRANSFEMORAL;  Surgeon: [name], MD;  Location: [facility];  Service: Open Heart Surgery;  Laterality: N/A;  . TRANSTHORACIC ECHOCARDIOGRAM  03-23-2016   dr Irish Lack   EF 55-60%, reduced contribution atrial contraction to ventricular filling, due to increased ventricular diastolic pressure or atrial contratile dysfunction/  bioprosthetic AV w/ mod. regurg. (valve area 1.97cm^2)/ mild dilated ascending aorta/ mild thicken MV normal function annular ring prosthesis/severe LAE & mod. to sev.RAE/ PASP 12mHg/ mild TR/ ventricle septal motion show paradox  . TRANSURETHRAL RESECTION OF BLADDER TUMOR N/A 02/13/2016   Procedure: TRANSURETHRAL RESECTION OF BLADDER TUMOR (TURBT);  Surgeon: [name] MD;  [facility]  Service: Urology;  Laterality: N/A;  . TRANSURETHRAL RESECTION OF BLADDER TUMOR N/A 09/04/2016   Procedure: TRANSURETHRAL RESECTION OF BLADDER TUMOR (TURBT);  Surgeon: [name] MD;  [facility]  Service: Urology;  Laterality: N/A;     Current Outpatient Medications  Medication Sig Dispense Refill  . amoxicillin (AMOXIL) 500 MG tablet Take 4 tablets (2,000 mg total) by mouth as directed. Take 1 hour prior to dental work, including cleanings. 4 tablet 6  . aspirin 81 MG chewable tablet Chew 1 tablet (81 mg  total) by mouth daily. 90 tablet 1  . atorvastatin (LIPITOR) 40 MG tablet TAKE 1 TABLET EVERY EVENING 90 tablet 2  . ELIQUIS 5 MG TABS tablet TAKE 1 TABLET TWICE A DAY 180 tablet 2  . ezetimibe (ZETIA) 10 MG tablet Take 1 tablet (10 mg total) by mouth every evening. 90 tablet 3  . furosemide (LASIX) 20 MG tablet Take 1 tablet (20 mg total) by mouth every other day. Pt may take additional doses if needed for leg swelling 30 tablet 12  . losartan (COZAAR) 25 MG tablet Take 1 tablet (25 mg total) by mouth daily. 90 tablet 3  . metoprolol tartrate (LOPRESSOR)  25 MG tablet Take 0.5 tablets (12.5 mg total) by mouth 2 (two) times daily. 90 tablet 3   No current facility-administered medications for this visit.     Allergies:   Patient has no known allergies.    Social History:  The patient  reports that he has never smoked. He has never used smokeless tobacco. He reports current alcohol use of about 1.0 - 2.0 standard drinks of alcohol per week. He reports that he does not use drugs.   Family History:  The patient's family history includes Leukemia in his mother; Supraventricular tachycardia in his sister.    ROS:  Please see the history of present illness.   Otherwise, review of systems are positive for recent dental problems.   All other systems are reviewed and negative.    PHYSICAL EXAM: VS:  BP (!) 142/80   Pulse 68   Ht _0  (1.803 m)   Wt 187 lb (84.8 kg)   SpO2 98%   BMI 26.08 kg/m  , BMI Body mass index is 26.08 kg/m. GEN: Well nourished, well developed, in no acute distress  HEENT: normal  Neck: no JVD, carotid bruits, or masses Cardiac: RRR; no murmurs, rubs, or gallops,no edema  Respiratory:  clear to auscultation bilaterally, normal work of breathing GI: soft, nontender, nondistended, + BS MS: no deformity or atrophy  Skin: warm and dry, no rash Neuro:  Strength and sensation are intact Psych: euthymic mood, full affect     Recent Labs: 11/20/2018: B Natriuretic  Peptide 380.4 11/26/2018: Hemoglobin 12.7; Magnesium 1.9; Platelets 116 12/03/2018: ALT 25 12/17/2018: BUN 14; Creatinine, Ser 0.96; Potassium 5.0; Sodium 141   Lipid Panel No results found for: CHOL, TRIG, HDL, CHOLHDL, VLDL, LDLCALC, LDLDIRECT   Other studies Reviewed: Additional studies/ records that were reviewed today with results demonstrating: 2020 echo: The left ventricle has low normal systolic function, with an ejection fraction of 50-55%. The cavity size was normal. There is severe asymmetric left ventricular hypertrophy. Left ventricular diastolic function could not be evaluated due to mitral  valve replacement/repair. No evidence of left ventricular regional wall motion abnormalities.  2. The right ventricle has normal systolic function. The cavity was mildly enlarged. There is no increase in right ventricular wall thickness.  3. Left atrial size was mildly dilated.  4. Right atrial size was moderately dilated.  5. A Sorin annuloplasty ring valve is present in the mitral position.  6. Mild mitral valve stenosis.  7. The tricuspid valve is grossly normal.  8. A 103m an EWende CreaseSapien bioprosthetic aortic valve (TAVR) valve is present in the aortic position. Procedure Date: 11/26/2018 Echo findings are consistent with perivalvular leak of the aortic prosthesis.  9. Aortic valve regurgitation is mild by color flow Doppler. Mild stenosis of the aortic valve. 10. The aorta is abnormal in size and structure. 11. There is mild dilatation of the ascending aorta measuring 39 mm. .   ASSESSMENT AND PLAN:  1. S/p valve in valve TAVR: No CHF. needs SBE prophylaxis.  SOme DOE. Seeing valve clinic in 11/2018. 2. S/p MV repair: needs antibiotics for dental procedures. 3. HTN: The current medical regimen is effective;  continue present plan and medications. 4. Chronic diastolic heart failure: Using Lasix as needed.  Weight is stable and he does not feel any fluid ovelroad.  He will  continue to monotor.  5. CAD: s/p CABG.  No angina. 6. Prior DVT: on anticoagulation.  Check labs today. 7. We also discussed COVID  vaccine.  He would not be in the first group to receive vaccine since he lives at home.  He will checkj in with Dr. Laurann Montana as well.    Current medicines are reviewed at length with the patient today.  The patient concerns regarding his medicines were addressed.  The following changes have been made:  No change  Labs/ tests ordered today include:  No orders of the defined types were placed in this encounter.   Recommend 150 minutes/week of aerobic exercise Low fat, low carb, high fiber diet recommended  Disposition:   FU in 1 year   Signed, [name], MD  04/27/2019 8:12 AM    [facility] [address], [place], [place]  [address] Phone: [phone]; Fax: [phone]

## 2019-04-27 ENCOUNTER — Other Ambulatory Visit: Payer: Self-pay | Admitting: Interventional Cardiology

## 2019-04-27 ENCOUNTER — Other Ambulatory Visit: Payer: Self-pay

## 2019-04-27 ENCOUNTER — Ambulatory Visit: Payer: Medicare Other | Admitting: Interventional Cardiology

## 2019-04-27 ENCOUNTER — Encounter: Payer: Self-pay | Admitting: Interventional Cardiology

## 2019-04-27 VITALS — BP 142/80 | HR 68 | Ht 71.0 in | Wt 187.0 lb

## 2019-04-27 DIAGNOSIS — I10 Essential (primary) hypertension: Secondary | ICD-10-CM

## 2019-04-27 DIAGNOSIS — Z86711 Personal history of pulmonary embolism: Secondary | ICD-10-CM | POA: Diagnosis not present

## 2019-04-27 DIAGNOSIS — I5032 Chronic diastolic (congestive) heart failure: Secondary | ICD-10-CM | POA: Diagnosis not present

## 2019-04-27 DIAGNOSIS — Z952 Presence of prosthetic heart valve: Secondary | ICD-10-CM | POA: Diagnosis not present

## 2019-04-27 LAB — COMPREHENSIVE METABOLIC PANEL
ALT: 25 IU/L (ref 0–44)
AST: 32 IU/L (ref 0–40)
Albumin/Globulin Ratio: 1.6 (ref 1.2–2.2)
Albumin: 4.4 g/dL (ref 3.6–4.6)
Alkaline Phosphatase: 68 IU/L (ref 39–117)
BUN/Creatinine Ratio: 14 (ref 10–24)
BUN: 15 mg/dL (ref 8–27)
Bilirubin Total: 2.5 mg/dL — ABNORMAL HIGH (ref 0.0–1.2)
CO2: 22 mmol/L (ref 20–29)
Calcium: 9.5 mg/dL (ref 8.6–10.2)
Chloride: 103 mmol/L (ref 96–106)
Creatinine, Ser: 1.07 mg/dL (ref 0.76–1.27)
GFR calc Af Amer: 72 mL/min/{1.73_m2} (ref >59)
GFR calc non Af Amer: 62 mL/min/{1.73_m2} (ref >59)
Globulin, Total: 2.7 g/dL (ref 1.5–4.5)
Glucose: 95 mg/dL (ref 65–99)
Potassium: 4.2 mmol/L (ref 3.5–5.2)
Sodium: 139 mmol/L (ref 134–144)
Total Protein: 7.1 g/dL (ref 6.0–8.5)

## 2019-04-27 LAB — CBC
Hematocrit: 47.2 % (ref 37.5–51.0)
Hemoglobin: 15.3 g/dL (ref 13.0–17.7)
MCH: 30.6 pg (ref 26.6–33.0)
MCHC: 32.4 g/dL (ref 31.5–35.7)
MCV: 94 fL (ref 79–97)
Platelets: 177 10*3/uL (ref 150–450)
RBC: 5 x10E6/uL (ref 4.14–5.80)
RDW: 11.9 % (ref 11.6–15.4)
WBC: 4.2 10*3/uL (ref 3.4–10.8)

## 2019-04-27 LAB — LIPID PANEL
Chol/HDL Ratio: 2.1 ratio (ref 0.0–5.0)
Cholesterol, Total: 130 mg/dL (ref 100–199)
HDL: 63 mg/dL (ref >39)
LDL Chol Calc (NIH): 53 mg/dL (ref 0–99)
Triglycerides: 66 mg/dL (ref 0–149)
VLDL Cholesterol Cal: 14 mg/dL (ref 5–40)

## 2019-04-27 MED ORDER — AMOXICILLIN 500 MG PO TABS: 2000.0000 mg | ORAL_TABLET | ORAL | 6 refills | Status: AC

## 2019-04-27 NOTE — Patient Instructions (Signed)
Medication Instructions:  Your physician recommends that you continue on your current medications as directed. Please refer to the Current Medication list given to you today.  *If you need a refill on your cardiac medications before your next appointment, please call your pharmacy*  Lab Work: TODAY: CBC, CMET, LIPIDS  If you have labs (blood work) drawn today and your tests are completely normal, you will receive your results only by: Marland Kitchen MyChart Message (if you have MyChart) OR . A paper copy in the mail If you have any lab test that is abnormal or we need to change your treatment, we will call you to review the results.  Testing/Procedures: None ordered  Follow-Up: At [facility], you and your health needs are our priority.  As part of our continuing mission to provide you with exceptional heart care, we have created designated Provider Care Teams.  These Care Teams include your primary Cardiologist (physician) and Advanced Practice Providers (APPs -  Physician Assistants and Nurse Practitioners) who all work together to provide you with the care you need, when you need it.  Your next appointment:   12 month(s)  The format for your next appointment:   In Person  Provider:   You may see [name], MD or one of the following Advanced Practice Providers on your designated Care Team:    Melina Copa, PA-C  Ermalinda Barrios, PA-C   Other Instructions Your provider recommends that you maintain 150 minutes per week of moderate aerobic activity.

## 2019-04-28 ENCOUNTER — Ambulatory Visit: Payer: Medicare Other | Attending: Internal Medicine | Admitting: Physical Therapy

## 2019-04-28 DIAGNOSIS — R262 Difficulty in walking, not elsewhere classified: Secondary | ICD-10-CM | POA: Diagnosis present

## 2019-04-28 DIAGNOSIS — R2689 Other abnormalities of gait and mobility: Secondary | ICD-10-CM | POA: Insufficient documentation

## 2019-04-28 NOTE — Therapy (Signed)
[facility] [address] [place], [name], [address] Phone: [phone]   Fax:  [phone]  Physical Therapy Treatment/Recert/MCR progress note  Progress Note reporting period 03/16/19 to 04/27/19  See below for objective and subjective measurements relating to patients progress with PT.   Patient Details  Name: Eric [name] MRN: [id number] Date of Birth: [birth year] Referring Provider (PT): [name], MD (PCP)   Encounter Date: 04/28/2019  PT End of Session - 04/28/19 0940    Visit Number  9    Number of Visits  20   recert performed today 04/28/19 to extend [place] 12 more visits for 6 weeks   Date for PT Re-Evaluation  06/09/19    Authorization Type  [employer] MCR    PT Start Time  0850    PT Stop Time  0932    PT Time Calculation (min)  42 min    Activity Tolerance  Patient tolerated treatment well    Behavior During Therapy  [facility] for tasks assessed/performed       Past Medical History:  Diagnosis Date  . Atrial flutter, paroxysmal ([place])    a. dx 04-02-2014, s/p  successful cardioversion 04-06-2014.  Marland Kitchen Chronic anticoagulation    on Eliquis--  due to recurrent dvt's and pe's  . Dyslipidemia   . History of bladder cancer urologist-  dr [name]   02-13-2016  s/p TURBT per path high grade papillary urothelial carcinoma  . History of DVT (deep vein thrombosis)    2000-- RLE  . History of melanoma excision    left flank;  07/ 2014 left lower leg and right upper chest  . History of prostate cancer    Gleason 6--  s/p  radical prostatectomy 06/ 2000 in [place]---  no recurrence  . History of pulmonary embolus (PE)    2000 & 2005  bilateral  . Hx of valvuloplasty 06/19/2007   a. s/p mitral ring annuloplasty, repair ruptured chordae of P2 flail segments of MV  . S/P aortic valve replacement with bioprosthetic valve    06-19-2007  severe aortic valve stenosis  . 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 Single vessel  coronary artery disease   cardiologist-  dr Irish Lack   a. 05/2007 CABG x 1: s/p SVG-OM;  b. 10/2010 Ex MV: EF 72%, inf attenuation w/o ischemia, brief run of PAT with exercise. To  . Thrombocytopenia ([place])   . Thyroid goiter 2013   nodular  . Wears hearing aid    BILATERAL  . Wears partial dentures    UPPER    Past Surgical History:  Procedure Laterality Date  . AORTIC VALVE REPLACEMENT (AVR)/CORONARY ARTERY BYPASS GRAFTING (CABG)  06-19-2007  dr [name]   SVG to OM1 ;   AVR w/ #23 Mitralflow pericardial and ligation left atrial appendage;  MV repair w/ #28 Sorin 3-D memo Ring Annuloplasty with repair ruptured chordar of P-2 flail segments  . CARDIAC CATHETERIZATION  05-12-2007  dr Leonia Reeves   single vessel 30% ostial LAD,  80% LCx;  severe to critial AS,  moderate MR,  normal LVF, ef 70%,  normal right heart pressures and cardiac outputs,  mild elevated LV end-diastolic pressure    . CARDIOVASCULAR STRESS TEST  11-02-2010  dr Irish Lack   normal nuclear study w/ no ischemia or infarct/scar/  normal LV function and wall motion , ef 72%  . CARDIOVERSION N/A 04/06/2014   Procedure: CARDIOVERSION;  Surgeon: [name], MD;  Location: [place];  Service:  Cardiovascular;  Laterality: N/A;   successful  . CATARACT EXTRACTION W/ INTRAOCULAR LENS  IMPLANT, BILATERAL  2012  approx  . CORONARY ARTERY BYPASS GRAFT    . RETROPUBIC RADICAL PROSTATECTOMY  06/ 2000  in [place]  . RIGHT/LEFT HEART CATH AND CORONARY/GRAFT ANGIOGRAPHY N/A 10/29/2018   Procedure: RIGHT/LEFT HEART CATH AND CORONARY/GRAFT ANGIOGRAPHY;  Surgeon: [name], MD;  Location: [place] CV LAB;  Service: Cardiovascular;  Laterality: N/A;  . ROTATOR CUFF REPAIR Left 09/1998  . TEE WITHOUT CARDIOVERSION N/A 04/06/2014   Procedure: TRANSESOPHAGEAL ECHOCARDIOGRAM (TEE);  Surgeon: [name], MD;  Location: [facility] ENDOSCOPY;  Service: Cardiovascular;  Laterality: N/A;  mild focal basa LVH of the septum, ef 50-55%, s/p  AV annuloplasty ring, elongated chordae, thickened leaflets, mild to mod. MR, multiple small jets/arotic bioprosthetic valve sits well in position, mild AI/ thickened TV w/ mild reurg./mild LA  . TEE WITHOUT CARDIOVERSION N/A 10/22/2018   Procedure: TRANSESOPHAGEAL ECHOCARDIOGRAM (TEE);  Surgeon: [name], MD;  Location: [facility] ENDOSCOPY;  Service: Cardiovascular;  Laterality: N/A;  . TEE WITHOUT CARDIOVERSION N/A 11/25/2018   Procedure: TRANSESOPHAGEAL ECHOCARDIOGRAM (TEE);  Surgeon: [name], MD;  Location: [place];  Service: Open Heart Surgery;  Laterality: N/A;  . TRANSCATHETER AORTIC VALVE REPLACEMENT, TRANSFEMORAL N/A 11/25/2018   Procedure: TRANSCATHETER AORTIC VALVE REPLACEMENT, TRANSFEMORAL;  Surgeon: [name], MD;  Location: [place];  Service: Open Heart Surgery;  Laterality: N/A;  . TRANSTHORACIC ECHOCARDIOGRAM  03-23-2016   dr Irish Lack   EF 55-60%, reduced contribution atrial contraction to ventricular filling, due to increased ventricular diastolic pressure or atrial contratile dysfunction/  bioprosthetic AV w/ mod. regurg. (valve area 1.97cm^2)/ mild dilated ascending aorta/ mild thicken MV normal function annular ring prosthesis/severe LAE & mod. to sev.RAE/ PASP 46mHg/ mild TR/ ventricle septal motion show paradox  . TRANSURETHRAL RESECTION OF BLADDER TUMOR N/A 02/13/2016   Procedure: TRANSURETHRAL RESECTION OF BLADDER TUMOR (TURBT);  Surgeon: [name] MD;  [facility]  Service: Urology;  Laterality: N/A;  . TRANSURETHRAL RESECTION OF BLADDER TUMOR N/A 09/04/2016   Procedure: TRANSURETHRAL RESECTION OF BLADDER TUMOR (TURBT);  Surgeon: [name] MD;  [facility]  Service: Urology;  Laterality: N/A;    There were no vitals filed for this visit.  Subjective Assessment - 04/28/19 0906    Subjective  Has missed 2 weeks of PT due to having abssess tooth that had to get surgery on. He is taking eliquis so MD  recommended no physical activity for 2 weeks. He feels he needs to continue with PT and request's another 6 weeks.    Pertinent History  PMH: CABG 10/29/18, aortic valve replacment (TAVR) 11/2018, bladder and prostate CA,CAD, RTC repair 09/1998 on Lt    Limitations  Walking;House hold activities;Lifting;Standing    How long can you stand comfortably?  20    How long can you walk comfortably?  can walk through supermarket, 2-3 blocks.    Patient Stated Goals  improve balance    Currently in Pain?  No/denies         [facility] - 04/28/19 0001      Assessment   Medical Diagnosis  Gait/balance    Referring Provider (PT)  GLavone Orn MD (PCP)      BMerrilee JanskyBalance Test   Sit to Stand  Able to stand without using hands and stabilize independently    Standing Unsupported  Able to stand safely 2 minutes  Sitting with Back Unsupported but Feet Supported on Floor or Stool  Able to sit safely and securely 2 minutes    Stand to Sit  Sits safely with minimal use of hands    Transfers  Able to transfer safely, minor use of hands    Standing Unsupported with Eyes Closed  Able to stand 10 seconds with supervision    Standing Unsupported with Feet Together  Able to place feet together independently and stand 1 minute safely    From Standing, Reach Forward with Outstretched Arm  Can reach confidently >25 cm (10")    From Standing Position, Pick up Object from Floor  Able to pick up shoe safely and easily    From Standing Position, Turn to Look Behind Over each Shoulder  Looks behind from both sides and weight shifts well    Turn 360 Degrees  Able to turn 360 degrees safely in 4 seconds or less    Standing Unsupported, Alternately Place Feet on Step/Stool  Able to stand independently and safely and complete 8 steps in 20 seconds    Standing Unsupported, One Foot in Front  Able to plae foot ahead of the other independently and hold 30 seconds    Standing on One Leg  Tries to lift leg/unable to hold  3 seconds but remains standing independently    Total Score  51      High Level Balance   High Level Balance Comments  rocker board ant-post and lateral X 20 ea, balance on foam feet together eyes closed, feet together eyes open with head turns vertical and Horizontal, balance on bosu feet apart      Functional Gait  Assessment   Gait Level Surface  Walks 20 ft in less than 5.5 sec, no assistive devices, good speed, no evidence for imbalance, normal gait pattern, deviates no more than 6 in outside of the 12 in walkway width.    Change in Gait Speed  Able to smoothly change walking speed without loss of balance or gait deviation. Deviate no more than 6 in outside of the 12 in walkway width.    Gait with Horizontal Head Turns  Performs head turns smoothly with no change in gait. Deviates no more than 6 in outside 12 in walkway width    Gait with Vertical Head Turns  Performs task with slight change in gait velocity (eg, minor disruption to smooth gait path), deviates 6 - 10 in outside 12 in walkway width or uses assistive device    Gait and Pivot Turn  Pivot turns safely within 3 sec and stops quickly with no loss of balance.    Step Over Obstacle  Is able to step over one shoe box (4.5 in total height) without changing gait speed. No evidence of imbalance.    Gait with Narrow Base of Support  Ambulates less than 4 steps heel to toe or cannot perform without assistance.    Gait with Eyes Closed  Walks 20 ft, slow speed, abnormal gait pattern, evidence for imbalance, deviates 10-15 in outside 12 in walkway width. Requires more than 9 sec to ambulate 20 ft.    Ambulating Backwards  Walks 20 ft, uses assistive device, slower speed, mild gait deviations, deviates 6-10 in outside 12 in walkway width.    Steps  Alternating feet, no rail.    Total Score  22                   [facility] Adult PT  Treatment/Exercise - 04/28/19 0001      Exercises   Other Exercises   Nu step for endurance L5 X 6 min              PT Education - 04/28/19 0938    Education Details  retest measurments results, POC to recert    Person(s) Educated  Patient    Methods  Explanation;Demonstration;Handout    Comprehension  Verbalized understanding;Returned demonstration       PT Short Term Goals - 04/28/19 0944      PT SHORT TERM GOAL #1   Title  Pt will be I and compliant with initial HEP. 4 weeks 04/15/19    Status  Achieved      PT SHORT TERM GOAL #2   Title  Pt will improve BERG balance score to at least 48. 4 weeks 03/15/19    Status  Achieved        PT Long Term Goals - 04/28/19 0944      PT LONG TERM GOAL #1   Title  Pt will be I and compliant with HEP. (target for all goals 6 weeks 04/27/19)    Status  On-going      PT LONG TERM GOAL #2   Title  Pt will improv BERG balance score >50 to show improved balance    Status  Achieved      PT LONG TERM GOAL #3   Title  Pt will improve FGA to at least 24 to show improved balance.    Status  On-going      PT LONG TERM GOAL #4   Title  Pt will improve gait speed in 20 ft walk test from 6.5 sec to less than 6 sec.    Status  On-going            Plan - 04/28/19 9924    Clinical Impression Statement  Recert performed today as he is out of POC. He had to miss a couple weeks due to having dental work and PCP recommendations to hold physical activity for 2 weeks. Overall he has made progress in balance, gait, and endurance but still has deficits in these areas and has not yet met his goals. He will continue to benefit from skilled PT and PT will extend his POC for 2 times per week for another 6 weeks which should allow time for him to meet his functional goals.    Personal Factors and Comorbidities  Comorbidity 1;Comorbidity 2;Comorbidity 3+    Comorbidities  PMH: CABG 10/29/18, aortic valve replacment (TAVR) 11/2018, bladder and prostate CA,CAD, RTC repair 09/1998 on Lt    Examination-Activity Limitations  Carry;Squat;Lift;Stand;Locomotion  Level    Examination-Participation Restrictions  Community Activity;Yard Work;Shop    Stability/Clinical Decision Making  Evolving/Moderate complexity    Rehab Potential  Good    PT Frequency  2x / week    PT Duration  6 weeks    PT Treatment/Interventions  ADLs/Self Care Home Management;Aquatic Therapy;Gait training;Stair training;Functional mobility training;Therapeutic activities;Therapeutic exercise;Balance training;Neuromuscular re-education;Manual techniques;Orthotic Fit/Training;Taping    PT Next Visit Plan  needs higher level balance challenges, leg strength and endurance.    PT Home Exercise Plan  Access Code: [email or web address]    [email or web address] and Agree with Plan of Care  Patient       Patient will benefit from skilled therapeutic intervention in order to improve the following deficits and impairments:  Abnormal gait, Cardiopulmonary status limiting activity, Decreased activity tolerance, Decreased balance, Decreased endurance, Decreased strength, Difficulty walking  Visit Diagnosis: Other abnormalities of gait and mobility  Difficulty in walking, not elsewhere classified     Problem List Patient Active Problem List   Diagnosis Date Noted  . History of mitral valve repair 11/26/2018  . Prosthetic valve dysfunction 11/25/2018  . Ectatic abdominal aorta ([place]) 11/25/2018  . 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 History of bladder cancer   . History of pulmonary embolus (PE)   . Acute on chronic diastolic heart failure ([place]) 04/23/2018  . History of aortic valve replacement 04/23/2018  . Atrial fibrillation ([place]) 04/23/2018  . Coronary atherosclerosis of native coronary artery 02/25/2014  . Hyperlipidemia 02/25/2014  . Embolism and thrombosis ([place]) 03/11/2013    Silvestre Mesi 04/28/2019, 9:45 AM  [facility] [address] [place], [name], [address] Phone: [phone]   Fax:  [phone]  Name: Eric [name] MRN: [id number] Date of Birth: 1929/10/27

## 2019-04-30 ENCOUNTER — Ambulatory Visit: Payer: Medicare Other | Admitting: Physical Therapy

## 2019-04-30 ENCOUNTER — Encounter: Payer: Self-pay | Admitting: Physical Therapy

## 2019-04-30 ENCOUNTER — Other Ambulatory Visit: Payer: Self-pay

## 2019-04-30 DIAGNOSIS — R2689 Other abnormalities of gait and mobility: Secondary | ICD-10-CM | POA: Diagnosis not present

## 2019-04-30 DIAGNOSIS — R262 Difficulty in walking, not elsewhere classified: Secondary | ICD-10-CM

## 2019-04-30 NOTE — Therapy (Signed)
[facility] [address] [place], [name], [address] Phone: [phone]   Fax:  [phone]  Physical Therapy Treatment  Patient Details  Name: Eric [name] MRN: [id number] Date of Birth: 09/22/1929 Referring Provider (PT): [name], MD (PCP)   Encounter Date: 04/30/2019  PT End of Session - 04/30/19 0932    Visit Number  10    Number of Visits  20   recert and progres note performed 04/28/19 to extend [place] 12 more visits for 6 weeks   Date for PT Re-Evaluation  06/09/19    Authorization Type  [employer] MCR (did progress note visit 9 as his POC was up, next progress note needed at visit 19    PT Start Time  0850    PT Stop Time  0930    PT Time Calculation (min)  40 min    Activity Tolerance  Patient tolerated treatment well    Behavior During Therapy  [facility] for tasks assessed/performed       Past Medical History:  Diagnosis Date  . Atrial flutter, paroxysmal ([place])    a. dx 04-02-2014, s/p  successful cardioversion 04-06-2014.  Marland Kitchen Chronic anticoagulation    on Eliquis--  due to recurrent dvt's and pe's  . Dyslipidemia   . History of bladder cancer urologist-  dr [name]   02-13-2016  s/p TURBT per path high grade papillary urothelial carcinoma  . History of DVT (deep vein thrombosis)    2000-- RLE  . History of melanoma excision    left flank;  07/ 2014 left lower leg and right upper chest  . History of prostate cancer    Gleason 6--  s/p  radical prostatectomy 06/ 2000 in [place]---  no recurrence  . History of pulmonary embolus (PE)    2000 & 2005  bilateral  . Hx of valvuloplasty 06/19/2007   a. s/p mitral ring annuloplasty, repair ruptured chordae of P2 flail segments of MV  . S/P aortic valve replacement with bioprosthetic valve    06-19-2007  severe aortic valve stenosis  . 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 Single vessel coronary artery disease   cardiologist-  dr Irish Lack   a. 05/2007 CABG x 1:  s/p SVG-OM;  b. 10/2010 Ex MV: EF 72%, inf attenuation w/o ischemia, brief run of PAT with exercise. To  . Thrombocytopenia ([place])   . Thyroid goiter 2013   nodular  . Wears hearing aid    BILATERAL  . Wears partial dentures    UPPER    Past Surgical History:  Procedure Laterality Date  . AORTIC VALVE REPLACEMENT (AVR)/CORONARY ARTERY BYPASS GRAFTING (CABG)  06-19-2007  dr [name]   SVG to OM1 ;   AVR w/ #23 Mitralflow pericardial and ligation left atrial appendage;  MV repair w/ #28 Sorin 3-D memo Ring Annuloplasty with repair ruptured chordar of P-2 flail segments  . CARDIAC CATHETERIZATION  05-12-2007  dr Leonia Reeves   single vessel 30% ostial LAD,  80% LCx;  severe to critial AS,  moderate MR,  normal LVF, ef 70%,  normal right heart pressures and cardiac outputs,  mild elevated LV end-diastolic pressure    . CARDIOVASCULAR STRESS TEST  11-02-2010  dr Irish Lack   normal nuclear study w/ no ischemia or infarct/scar/  normal LV function and wall motion , ef 72%  . CARDIOVERSION N/A 04/06/2014   Procedure: CARDIOVERSION;  Surgeon: [name], MD;  Location: [place];  Service: Cardiovascular;  Laterality: N/A;  successful  . CATARACT EXTRACTION W/ INTRAOCULAR LENS  IMPLANT, BILATERAL  2012  approx  . CORONARY ARTERY BYPASS GRAFT    . RETROPUBIC RADICAL PROSTATECTOMY  06/ 2000  in [place]  . RIGHT/LEFT HEART CATH AND CORONARY/GRAFT ANGIOGRAPHY N/A 10/29/2018   Procedure: RIGHT/LEFT HEART CATH AND CORONARY/GRAFT ANGIOGRAPHY;  Surgeon: [name], MD;  Location: [place] CV LAB;  Service: Cardiovascular;  Laterality: N/A;  . ROTATOR CUFF REPAIR Left 09/1998  . TEE WITHOUT CARDIOVERSION N/A 04/06/2014   Procedure: TRANSESOPHAGEAL ECHOCARDIOGRAM (TEE);  Surgeon: [name], MD;  Location: [facility] ENDOSCOPY;  Service: Cardiovascular;  Laterality: N/A;  mild focal basa LVH of the septum, ef 50-55%, s/p AV annuloplasty ring, elongated chordae, thickened leaflets, mild to mod. MR,  multiple small jets/arotic bioprosthetic valve sits well in position, mild AI/ thickened TV w/ mild reurg./mild LA  . TEE WITHOUT CARDIOVERSION N/A 10/22/2018   Procedure: TRANSESOPHAGEAL ECHOCARDIOGRAM (TEE);  Surgeon: [name], MD;  Location: [facility] ENDOSCOPY;  Service: Cardiovascular;  Laterality: N/A;  . TEE WITHOUT CARDIOVERSION N/A 11/25/2018   Procedure: TRANSESOPHAGEAL ECHOCARDIOGRAM (TEE);  Surgeon: [name], MD;  Location: [place];  Service: Open Heart Surgery;  Laterality: N/A;  . TRANSCATHETER AORTIC VALVE REPLACEMENT, TRANSFEMORAL N/A 11/25/2018   Procedure: TRANSCATHETER AORTIC VALVE REPLACEMENT, TRANSFEMORAL;  Surgeon: [name], MD;  Location: [place];  Service: Open Heart Surgery;  Laterality: N/A;  . TRANSTHORACIC ECHOCARDIOGRAM  03-23-2016   dr Irish Lack   EF 55-60%, reduced contribution atrial contraction to ventricular filling, due to increased ventricular diastolic pressure or atrial contratile dysfunction/  bioprosthetic AV w/ mod. regurg. (valve area 1.97cm^2)/ mild dilated ascending aorta/ mild thicken MV normal function annular ring prosthesis/severe LAE & mod. to sev.RAE/ PASP 57mmHg/ mild TR/ ventricle septal motion show paradox  . TRANSURETHRAL RESECTION OF BLADDER TUMOR N/A 02/13/2016   Procedure: TRANSURETHRAL RESECTION OF BLADDER TUMOR (TURBT);  Surgeon: Nickie Retort, MD;  Location: [facility];  Service: Urology;  Laterality: N/A;  . TRANSURETHRAL RESECTION OF BLADDER TUMOR N/A 09/04/2016   Procedure: TRANSURETHRAL RESECTION OF BLADDER TUMOR (TURBT);  Surgeon: Nickie Retort, MD;  Location: [facility];  Service: Urology;  Laterality: N/A;    There were no vitals filed for this visit.  Subjective Assessment - 04/30/19 0931    Subjective  Was not feeling up to doing much but glad he came, feels much better after exercising a little. Denies pain                       [facility] Adult PT Treatment/Exercise -  04/30/19 0001      High Level Balance   High Level Balance Comments  in bars for bosu balance 2 min blue side, 2 min black side, balance zone pad for rocking ant-post then tandem balance on balance zone foam 1 min bilat. Atttepted bosu step up and over but too dificult so switched to airex pad (too easy) so progressed to step up with alt leg march X 15 reps bilat. Then moved out of bars for dynamic gait/balance walking one lap carrying ball moving it in a circle clockwise wiize gaze fixed on ball, then onel lap moving ball counterclockwise, then one lap tossing ball to himself, then one lap farmers carry with 5 lb kettlebell in Lt hand, then one lap farmers carry with 5 lb in Rt hand.  [place] supervision needed with balance exercises      Exercises   Other Exercises  Nu step for endurance L6 X 6 min, sit to stands no UE support from standard chair 2 set of 10               PT Short Term Goals - 04/28/19 0944      PT SHORT TERM GOAL #1   Title  Pt will be I and compliant with initial HEP. 4 weeks 04/15/19    Status  Achieved      PT SHORT TERM GOAL #2   Title  Pt will improve BERG balance score to at least 48. 4 weeks 03/15/19    Status  Achieved        PT Long Term Goals - 04/28/19 0944      PT LONG TERM GOAL #1   Title  Pt will be I and compliant with HEP. (target for all goals 6 weeks 04/27/19)    Status  On-going      PT LONG TERM GOAL #2   Title  Pt will improv BERG balance score >50 to show improved balance    Status  Achieved      PT LONG TERM GOAL #3   Title  Pt will improve FGA to at least 24 to show improved balance.    Status  On-going      PT LONG TERM GOAL #4   Title  Pt will improve gait speed in 20 ft walk test from 6.5 sec to less than 6 sec.    Status  On-going            Plan - 04/30/19 0934    Clinical Impression Statement  Session focused on improving dynamic gait/balance today and overall endurance. He showed improved stabilty and  consistency with balance training today. PT will continue to progress as able toward his functional goals.    Comorbidities  PMH: CABG 10/29/18, aortic valve replacment (TAVR) 11/2018, bladder and prostate CA,CAD, RTC repair 09/1998 on Lt    Examination-Activity Limitations  Carry;Squat;Lift;Stand;Locomotion Level    Examination-Participation Restrictions  Community Activity;Yard Work;Shop    Stability/Clinical Decision Making  Evolving/Moderate complexity    Rehab Potential  Good    PT Frequency  2x / week    PT Duration  6 weeks    PT Treatment/Interventions  ADLs/Self Care Home Management;Aquatic Therapy;Gait training;Stair training;Functional mobility training;Therapeutic activities;Therapeutic exercise;Balance training;Neuromuscular re-education;Manual techniques;Orthotic Fit/Training;Taping    PT Next Visit Plan  needs higher level balance challenges, leg strength and endurance.    PT Home Exercise Plan  Access Code: [email or web address] and Agree with Plan of Care  Patient       Patient will benefit from skilled therapeutic intervention in order to improve the following deficits and impairments:  Abnormal gait, Cardiopulmonary status limiting activity, Decreased activity tolerance, Decreased balance, Decreased endurance, Decreased strength, Difficulty walking  Visit Diagnosis: Other abnormalities of gait and mobility  Difficulty in walking, not elsewhere classified     Problem List Patient Active Problem List   Diagnosis Date Noted  . History of mitral valve repair 11/26/2018  . Prosthetic valve dysfunction 11/25/2018  . Ectatic abdominal aorta ([place]) 11/25/2018  . 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 History of bladder cancer   . History of pulmonary embolus (PE)   . Acute on chronic diastolic heart failure ([place]) 04/23/2018  . History of aortic valve replacement 04/23/2018  . Atrial fibrillation ([place]) 04/23/2018  . Coronary atherosclerosis of native coronary artery  02/25/2014  . Hyperlipidemia 02/25/2014  . Embolism and thrombosis (  [place]) 03/11/2013    Silvestre Mesi 04/30/2019, 9:42 AM  [facility] [address] [place], [name], [address] Phone: [phone]   Fax:  [phone]  Name: MALLOY COLLETTA MRN: [id number] Date of Birth: 1930-05-14

## 2019-05-05 ENCOUNTER — Ambulatory Visit: Payer: Medicare Other | Admitting: Physical Therapy

## 2019-05-08 ENCOUNTER — Ambulatory Visit: Payer: Medicare Other

## 2019-05-18 ENCOUNTER — Other Ambulatory Visit: Payer: Self-pay

## 2019-05-18 ENCOUNTER — Ambulatory Visit (INDEPENDENT_AMBULATORY_CARE_PROVIDER_SITE_OTHER): Payer: Medicare Other | Admitting: Physical Therapy

## 2019-05-18 ENCOUNTER — Encounter: Payer: Self-pay | Admitting: Physical Therapy

## 2019-05-18 DIAGNOSIS — R2689 Other abnormalities of gait and mobility: Secondary | ICD-10-CM

## 2019-05-18 DIAGNOSIS — R262 Difficulty in walking, not elsewhere classified: Secondary | ICD-10-CM | POA: Diagnosis not present

## 2019-05-18 NOTE — Therapy (Signed)
[facility] Physical Therapy [address] [place], [name], [address] Phone: [phone]   Fax:  [phone]  Physical Therapy Treatment  Patient Details  Name: CARLY MILOS MRN: [id number] Date of Birth: 1930-04-23 Referring Provider (PT): [name], MD (PCP)   Encounter Date: 05/18/2019  PT End of Session - 05/18/19 1614    Visit Number  11    Number of Visits  20   recert and progres note performed 04/28/19 to extend [place] 12 more visits for 6 weeks   Date for PT Re-Evaluation  06/09/19    Authorization Type  [employer] MCR (did progress note visit 9 as his POC was up, next progress note needed at visit 19    PT Start Time  1530    PT Stop Time  1615    PT Time Calculation (min)  45 min    Activity Tolerance  Patient tolerated treatment well    Behavior During Therapy  [facility] for tasks assessed/performed       Past Medical History:  Diagnosis Date  . Atrial flutter, paroxysmal ([place])    a. dx 04-02-2014, s/p  successful cardioversion 04-06-2014.  Marland Kitchen Chronic anticoagulation    on Eliquis--  due to recurrent dvt's and pe's  . Dyslipidemia   . History of bladder cancer urologist-  dr [name]   02-13-2016  s/p TURBT per path high grade papillary urothelial carcinoma  . History of DVT (deep vein thrombosis)    2000-- RLE  . History of melanoma excision    left flank;  07/ 2014 left lower leg and right upper chest  . History of prostate cancer    Gleason 6--  s/p  radical prostatectomy 06/ 2000 in [place]---  no recurrence  . History of pulmonary embolus (PE)    2000 & 2005  bilateral  . Hx of valvuloplasty 06/19/2007   a. s/p mitral ring annuloplasty, repair ruptured chordae of P2 flail segments of MV  . S/P aortic valve replacement with bioprosthetic valve    06-19-2007  severe aortic valve stenosis  . 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 Single vessel coronary artery disease   cardiologist-  dr Irish Lack   a. 05/2007 CABG x 1: s/p SVG-OM;  b. 10/2010 Ex  MV: EF 72%, inf attenuation w/o ischemia, brief run of PAT with exercise. To  . Thrombocytopenia ([place])   . Thyroid goiter 2013   nodular  . Wears hearing aid    BILATERAL  . Wears partial dentures    UPPER    Past Surgical History:  Procedure Laterality Date  . AORTIC VALVE REPLACEMENT (AVR)/CORONARY ARTERY BYPASS GRAFTING (CABG)  06-19-2007  dr [name]   SVG to OM1 ;   AVR w/ #23 Mitralflow pericardial and ligation left atrial appendage;  MV repair w/ #28 Sorin 3-D memo Ring Annuloplasty with repair ruptured chordar of P-2 flail segments  . CARDIAC CATHETERIZATION  05-12-2007  dr Leonia Reeves   single vessel 30% ostial LAD,  80% LCx;  severe to critial AS,  moderate MR,  normal LVF, ef 70%,  normal right heart pressures and cardiac outputs,  mild elevated LV end-diastolic pressure    . CARDIOVASCULAR STRESS TEST  11-02-2010  dr Irish Lack   normal nuclear study w/ no ischemia or infarct/scar/  normal LV function and wall motion , ef 72%  . CARDIOVERSION N/A 04/06/2014   Procedure: CARDIOVERSION;  Surgeon: [name], MD;  Location: [place];  Service: Cardiovascular;  Laterality: N/A;   successful  .  CATARACT EXTRACTION W/ INTRAOCULAR LENS  IMPLANT, BILATERAL  2012  approx  . CORONARY ARTERY BYPASS GRAFT    . RETROPUBIC RADICAL PROSTATECTOMY  06/ 2000  in [place]  . RIGHT/LEFT HEART CATH AND CORONARY/GRAFT ANGIOGRAPHY N/A 10/29/2018   Procedure: RIGHT/LEFT HEART CATH AND CORONARY/GRAFT ANGIOGRAPHY;  Surgeon: [name], MD;  Location: [place] CV LAB;  Service: Cardiovascular;  Laterality: N/A;  . ROTATOR CUFF REPAIR Left 09/1998  . TEE WITHOUT CARDIOVERSION N/A 04/06/2014   Procedure: TRANSESOPHAGEAL ECHOCARDIOGRAM (TEE);  Surgeon: [name], MD;  Location: [facility] ENDOSCOPY;  Service: Cardiovascular;  Laterality: N/A;  mild focal basa LVH of the septum, ef 50-55%, s/p AV annuloplasty ring, elongated chordae, thickened leaflets, mild to mod. MR, multiple small  jets/arotic bioprosthetic valve sits well in position, mild AI/ thickened TV w/ mild reurg./mild LA  . TEE WITHOUT CARDIOVERSION N/A 10/22/2018   Procedure: TRANSESOPHAGEAL ECHOCARDIOGRAM (TEE);  Surgeon: [name], MD;  Location: [facility] ENDOSCOPY;  Service: Cardiovascular;  Laterality: N/A;  . TEE WITHOUT CARDIOVERSION N/A 11/25/2018   Procedure: TRANSESOPHAGEAL ECHOCARDIOGRAM (TEE);  Surgeon: [name], MD;  Location: [place];  Service: Open Heart Surgery;  Laterality: N/A;  . TRANSCATHETER AORTIC VALVE REPLACEMENT, TRANSFEMORAL N/A 11/25/2018   Procedure: TRANSCATHETER AORTIC VALVE REPLACEMENT, TRANSFEMORAL;  Surgeon: [name], MD;  Location: [place];  Service: Open Heart Surgery;  Laterality: N/A;  . TRANSTHORACIC ECHOCARDIOGRAM  03-23-2016   dr Irish Lack   EF 55-60%, reduced contribution atrial contraction to ventricular filling, due to increased ventricular diastolic pressure or atrial contratile dysfunction/  bioprosthetic AV w/ mod. regurg. (valve area 1.97cm^2)/ mild dilated ascending aorta/ mild thicken MV normal function annular ring prosthesis/severe LAE & mod. to sev.RAE/ PASP 18mmHg/ mild TR/ ventricle septal motion show paradox  . TRANSURETHRAL RESECTION OF BLADDER TUMOR N/A 02/13/2016   Procedure: TRANSURETHRAL RESECTION OF BLADDER TUMOR (TURBT);  Surgeon: Nickie Retort, MD;  Location: [facility];  Service: Urology;  Laterality: N/A;  . TRANSURETHRAL RESECTION OF BLADDER TUMOR N/A 09/04/2016   Procedure: TRANSURETHRAL RESECTION OF BLADDER TUMOR (TURBT);  Surgeon: Nickie Retort, MD;  Location: [facility];  Service: Urology;  Laterality: N/A;    There were no vitals filed for this visit.  Subjective Assessment - 05/18/19 1548    Subjective  has slacked off some on exercises but ready to get back after it. Denies pain upon arrival.    Pertinent History  PMH: CABG 10/29/18, aortic valve replacment (TAVR) 11/2018, bladder and prostate CA,CAD, RTC  repair 09/1998 on Lt    Limitations  Walking;House hold activities;Lifting;Standing    How long can you stand comfortably?  20    How long can you walk comfortably?  can walk through supermarket, 2-3 blocks.    Patient Stated Goals  improve balance                       [facility] Adult PT Treatment/Exercise - 05/18/19 0001      High Level Balance   High Level Balance Comments  in bars with intermit UE support PRN for rocker fitter board rocks ant-post and lateral X 20 reps ea then holding balance on board 1 min ea. Sidestepping on foam beam X 5 reps, tandem walk and retro tandem walk X 3 reps. Tandem balance on foam 30 sec X 2 bilat      Exercises   Exercises  Knee/Hip    Other Exercises   Nu step for endurance  L7X41min UE/LE. Farmers carry 10 lb KB one lap in right hand, one lap in Lt hand. Functional deadlift with 10 lb KB from 8 inch step X 10 reps (needed cues and demo for technique), goblet squat with 10 lb KB X 10 reps. Step ups onto 6 inch step with alt leg march for balance and stability X 15 reps bilat. Shuttle leg press 87 lbs 2X15 reps               PT Short Term Goals - 04/28/19 0944      PT SHORT TERM GOAL #1   Title  Pt will be I and compliant with initial HEP. 4 weeks 04/15/19    Status  Achieved      PT SHORT TERM GOAL #2   Title  Pt will improve BERG balance score to at least 48. 4 weeks 03/15/19    Status  Achieved        PT Long Term Goals - 04/28/19 0944      PT LONG TERM GOAL #1   Title  Pt will be I and compliant with HEP. (target for all goals 6 weeks 04/27/19)    Status  On-going      PT LONG TERM GOAL #2   Title  Pt will improv BERG balance score >50 to show improved balance    Status  Achieved      PT LONG TERM GOAL #3   Title  Pt will improve FGA to at least 24 to show improved balance.    Status  On-going      PT LONG TERM GOAL #4   Title  Pt will improve gait speed in 20 ft walk test from 6.5 sec to less than 6 sec.     Status  On-going            Plan - 05/18/19 1614    Clinical Impression Statement  Progressed general strength program and higher level balance challneges with good tolerance and without complaints. He still has deficits in these areas and PT will progress as able.    Comorbidities  PMH: CABG 10/29/18, aortic valve replacment (TAVR) 11/2018, bladder and prostate CA,CAD, RTC repair 09/1998 on Lt    Examination-Activity Limitations  Carry;Squat;Lift;Stand;Locomotion Level    Examination-Participation Restrictions  Community Activity;Yard Work;Shop    Stability/Clinical Decision Making  Evolving/Moderate complexity    Rehab Potential  Good    PT Frequency  2x / week    PT Duration  6 weeks    PT Treatment/Interventions  ADLs/Self Care Home Management;Aquatic Therapy;Gait training;Stair training;Functional mobility training;Therapeutic activities;Therapeutic exercise;Balance training;Neuromuscular re-education;Manual techniques;Orthotic Fit/Training;Taping    PT Next Visit Plan  needs higher level balance challenges, leg strength and endurance.    PT Home Exercise Plan  Access Code: [email or web address] and Agree with Plan of Care  Patient       Patient will benefit from skilled therapeutic intervention in order to improve the following deficits and impairments:  Abnormal gait, Cardiopulmonary status limiting activity, Decreased activity tolerance, Decreased balance, Decreased endurance, Decreased strength, Difficulty walking  Visit Diagnosis: Other abnormalities of gait and mobility  Difficulty in walking, not elsewhere classified     Problem List Patient Active Problem List   Diagnosis Date Noted  . History of mitral valve repair 11/26/2018  . Prosthetic valve dysfunction 11/25/2018  . Ectatic abdominal aorta ([place]) 11/25/2018  . 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 History of bladder cancer   . History of pulmonary  embolus (PE)   . Acute on chronic diastolic heart failure  ([place]) 04/23/2018  . History of aortic valve replacement 04/23/2018  . Atrial fibrillation ([place]) 04/23/2018  . Coronary atherosclerosis of native coronary artery 02/25/2014  . Hyperlipidemia 02/25/2014  . Embolism and thrombosis ([place]) 03/11/2013    Silvestre Mesi 05/18/2019, 4:18 PM  [facility] Physical Therapy [address] [place], [name], [address] Phone: [phone]   Fax:  [phone]  Name: Eric [name] MRN: [id number] Date of Birth: [birth year]

## 2019-05-19 ENCOUNTER — Encounter: Payer: Medicare Other | Admitting: Physical Therapy

## 2019-05-20 ENCOUNTER — Other Ambulatory Visit: Payer: Self-pay

## 2019-05-20 MED ORDER — FUROSEMIDE 20 MG PO TABS: 20.0000 mg | ORAL_TABLET | ORAL | 12 refills | Status: DC

## 2019-05-20 NOTE — Addendum Note (Signed)
Addended by: De Burrs on: 05/20/2019 05:10 PM   Modules accepted: Orders

## 2019-05-21 ENCOUNTER — Ambulatory Visit: Payer: Medicare Other | Admitting: Physical Therapy

## 2019-05-21 ENCOUNTER — Other Ambulatory Visit: Payer: Self-pay | Admitting: Interventional Cardiology

## 2019-05-21 ENCOUNTER — Other Ambulatory Visit: Payer: Self-pay

## 2019-05-21 DIAGNOSIS — R2689 Other abnormalities of gait and mobility: Secondary | ICD-10-CM | POA: Diagnosis not present

## 2019-05-21 DIAGNOSIS — R262 Difficulty in walking, not elsewhere classified: Secondary | ICD-10-CM | POA: Diagnosis not present

## 2019-05-21 MED ORDER — APIXABAN 5 MG PO TABS: 5.0000 mg | ORAL_TABLET | Freq: Two times a day (BID) | ORAL | 2 refills | Status: DC

## 2019-05-21 MED ORDER — LOSARTAN POTASSIUM 25 MG PO TABS: 25.0000 mg | ORAL_TABLET | Freq: Every day | ORAL | 3 refills | Status: DC

## 2019-05-21 MED ORDER — METOPROLOL TARTRATE 25 MG PO TABS: 12.5000 mg | ORAL_TABLET | Freq: Two times a day (BID) | ORAL | 3 refills | Status: DC

## 2019-05-21 NOTE — Telephone Encounter (Signed)
Pt last saw Dr Irish Lack 04/27/19, last labs 04/27/19 Creat 1.07, age 85, weight 84.8kg, based on specified criteria pt is on appropriate dosage of Eliquis 5mg  BID.  Will refill rx.

## 2019-05-21 NOTE — Therapy (Signed)
[facility] Physical Therapy [address] [place], [name], [address] Phone: [phone]   Fax:  [phone]  Physical Therapy Treatment  Patient Details  Name: Eric [name] MRN: [id number] Date of Birth: 24-Aug-1929 Referring Provider (PT): [name], MD (PCP)   Encounter Date: 05/21/2019  PT End of Session - 05/21/19 1207    Visit Number  12    Number of Visits  20   recert and progres note performed 04/28/19 to extend [place] 12 more visits for 6 weeks   Date for PT Re-Evaluation  06/09/19    Authorization Type  [employer] MCR (did progress note visit 9 as his POC was up, next progress note needed at visit 19    PT Start Time  1145    PT Stop Time  1230    PT Time Calculation (min)  45 min    Activity Tolerance  Patient tolerated treatment well    Behavior During Therapy  [facility] for tasks assessed/performed       Past Medical History:  Diagnosis Date  . Atrial flutter, paroxysmal ([place])    a. dx 04-02-2014, s/p  successful cardioversion 04-06-2014.  Marland Kitchen Chronic anticoagulation    on Eliquis--  due to recurrent dvt's and pe's  . Dyslipidemia   . History of bladder cancer urologist-  dr [name]   02-13-2016  s/p TURBT per path high grade papillary urothelial carcinoma  . History of DVT (deep vein thrombosis)    2000-- RLE  . History of melanoma excision    left flank;  07/ 2014 left lower leg and right upper chest  . History of prostate cancer    Gleason 6--  s/p  radical prostatectomy 06/ 2000 in [place]---  no recurrence  . History of pulmonary embolus (PE)    2000 & 2005  bilateral  . Hx of valvuloplasty 06/19/2007   a. s/p mitral ring annuloplasty, repair ruptured chordae of P2 flail segments of MV  . S/P aortic valve replacement with bioprosthetic valve    06-19-2007  severe aortic valve stenosis  . 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 Single vessel coronary artery disease   cardiologist-  dr Irish Lack   a. 05/2007 CABG x 1: s/p SVG-OM;  b. 10/2010 Ex  MV: EF 72%, inf attenuation w/o ischemia, brief run of PAT with exercise. To  . Thrombocytopenia ([place])   . Thyroid goiter 2013   nodular  . Wears hearing aid    BILATERAL  . Wears partial dentures    UPPER    Past Surgical History:  Procedure Laterality Date  . AORTIC VALVE REPLACEMENT (AVR)/CORONARY ARTERY BYPASS GRAFTING (CABG)  06-19-2007  dr [name]   SVG to OM1 ;   AVR w/ #23 Mitralflow pericardial and ligation left atrial appendage;  MV repair w/ #28 Sorin 3-D memo Ring Annuloplasty with repair ruptured chordar of P-2 flail segments  . CARDIAC CATHETERIZATION  05-12-2007  dr Leonia Reeves   single vessel 30% ostial LAD,  80% LCx;  severe to critial AS,  moderate MR,  normal LVF, ef 70%,  normal right heart pressures and cardiac outputs,  mild elevated LV end-diastolic pressure    . CARDIOVASCULAR STRESS TEST  11-02-2010  dr Irish Lack   normal nuclear study w/ no ischemia or infarct/scar/  normal LV function and wall motion , ef 72%  . CARDIOVERSION N/A 04/06/2014   Procedure: CARDIOVERSION;  Surgeon: [name], MD;  Location: [place];  Service: Cardiovascular;  Laterality: N/A;   successful  .  CATARACT EXTRACTION W/ INTRAOCULAR LENS  IMPLANT, BILATERAL  2012  approx  . CORONARY ARTERY BYPASS GRAFT    . RETROPUBIC RADICAL PROSTATECTOMY  06/ 2000  in [place]  . RIGHT/LEFT HEART CATH AND CORONARY/GRAFT ANGIOGRAPHY N/A 10/29/2018   Procedure: RIGHT/LEFT HEART CATH AND CORONARY/GRAFT ANGIOGRAPHY;  Surgeon: [name], MD;  Location: [place] CV LAB;  Service: Cardiovascular;  Laterality: N/A;  . ROTATOR CUFF REPAIR Left 09/1998  . TEE WITHOUT CARDIOVERSION N/A 04/06/2014   Procedure: TRANSESOPHAGEAL ECHOCARDIOGRAM (TEE);  Surgeon: [name], MD;  Location: [facility] ENDOSCOPY;  Service: Cardiovascular;  Laterality: N/A;  mild focal basa LVH of the septum, ef 50-55%, s/p AV annuloplasty ring, elongated chordae, thickened leaflets, mild to mod. MR, multiple small  jets/arotic bioprosthetic valve sits well in position, mild AI/ thickened TV w/ mild reurg./mild LA  . TEE WITHOUT CARDIOVERSION N/A 10/22/2018   Procedure: TRANSESOPHAGEAL ECHOCARDIOGRAM (TEE);  Surgeon: [name], MD;  Location: [facility] ENDOSCOPY;  Service: Cardiovascular;  Laterality: N/A;  . TEE WITHOUT CARDIOVERSION N/A 11/25/2018   Procedure: TRANSESOPHAGEAL ECHOCARDIOGRAM (TEE);  Surgeon: [name], MD;  Location: [place];  Service: Open Heart Surgery;  Laterality: N/A;  . TRANSCATHETER AORTIC VALVE REPLACEMENT, TRANSFEMORAL N/A 11/25/2018   Procedure: TRANSCATHETER AORTIC VALVE REPLACEMENT, TRANSFEMORAL;  Surgeon: [name], MD;  Location: [place];  Service: Open Heart Surgery;  Laterality: N/A;  . TRANSTHORACIC ECHOCARDIOGRAM  03-23-2016   dr Irish Lack   EF 55-60%, reduced contribution atrial contraction to ventricular filling, due to increased ventricular diastolic pressure or atrial contratile dysfunction/  bioprosthetic AV w/ mod. regurg. (valve area 1.97cm^2)/ mild dilated ascending aorta/ mild thicken MV normal function annular ring prosthesis/severe LAE & mod. to sev.RAE/ PASP 23mmHg/ mild TR/ ventricle septal motion show paradox  . TRANSURETHRAL RESECTION OF BLADDER TUMOR N/A 02/13/2016   Procedure: TRANSURETHRAL RESECTION OF BLADDER TUMOR (TURBT);  Surgeon: Nickie Retort, MD;  Location: [facility];  Service: Urology;  Laterality: N/A;  . TRANSURETHRAL RESECTION OF BLADDER TUMOR N/A 09/04/2016   Procedure: TRANSURETHRAL RESECTION OF BLADDER TUMOR (TURBT);  Surgeon: Nickie Retort, MD;  Location: [facility];  Service: Urology;  Laterality: N/A;    There were no vitals filed for this visit.  Subjective Assessment - 05/21/19 1206    Subjective  Relays his back was really sore after last session but has improved today and denies pain upon arrival.    Pertinent History  PMH: CABG 10/29/18, aortic valve replacment (TAVR) 11/2018, bladder and prostate  CA,CAD, RTC repair 09/1998 on Lt    Limitations  Walking;House hold activities;Lifting;Standing    How long can you stand comfortably?  20    How long can you walk comfortably?  can walk through supermarket, 2-3 blocks.    Patient Stated Goals  improve balance         [facility] Adult PT Treatment/Exercise - 05/21/19 0001      High Level Balance   High Level Balance Comments  at counter top foam beam lateral walking and fwd walking up/down X 5 reps, step ups onto foam beam and off without UE support X 15 reps bilat, balance with feet apart on airex and eyes closed 30 sec X 3 reps      Exercises   Other Exercises   Nu step for endurance L7X42min UE/LE. Farmers carry 10 lb KB one lap in right hand, one lap in Lt hand. Step ups without UE support 8 inch step X 10 reps forward  then lateral. Bent over row with 10 lb KB X 15 reps.  Sit to stands no UE support X 15 reps               PT Short Term Goals - 04/28/19 0944      PT SHORT TERM GOAL #1   Title  Pt will be I and compliant with initial HEP. 4 weeks 04/15/19    Status  Achieved      PT SHORT TERM GOAL #2   Title  Pt will improve BERG balance score to at least 48. 4 weeks 03/15/19    Status  Achieved        PT Long Term Goals - 04/28/19 0944      PT LONG TERM GOAL #1   Title  Pt will be I and compliant with HEP. (target for all goals 6 weeks 04/27/19)    Status  On-going      PT LONG TERM GOAL #2   Title  Pt will improv BERG balance score >50 to show improved balance    Status  Achieved      PT LONG TERM GOAL #3   Title  Pt will improve FGA to at least 24 to show improved balance.    Status  On-going      PT LONG TERM GOAL #4   Title  Pt will improve gait speed in 20 ft walk test from 6.5 sec to less than 6 sec.    Status  On-going            Plan - 05/21/19 1237    Clinical Impression Statement  Held off on some lumbar strength due to back soreness. He was able to perform leg strength and balance training  withtout complaints of back pain/soreness. He is making slow but steady progress in all areas    Comorbidities  PMH: CABG 10/29/18, aortic valve replacment (TAVR) 11/2018, bladder and prostate CA,CAD, RTC repair 09/1998 on Lt    Examination-Activity Limitations  Carry;Squat;Lift;Stand;Locomotion Level    Examination-Participation Restrictions  Community Activity;Yard Work;Shop    Stability/Clinical Decision Making  Evolving/Moderate complexity    Rehab Potential  Good    PT Frequency  2x / week    PT Duration  6 weeks    PT Treatment/Interventions  ADLs/Self Care Home Management;Aquatic Therapy;Gait training;Stair training;Functional mobility training;Therapeutic activities;Therapeutic exercise;Balance training;Neuromuscular re-education;Manual techniques;Orthotic Fit/Training;Taping    PT Next Visit Plan  needs higher level balance challenges, leg strength and endurance.    PT Home Exercise Plan  Access Code: [email or web address] and Agree with Plan of Care  Patient       Patient will benefit from skilled therapeutic intervention in order to improve the following deficits and impairments:  Abnormal gait, Cardiopulmonary status limiting activity, Decreased activity tolerance, Decreased balance, Decreased endurance, Decreased strength, Difficulty walking  Visit Diagnosis: Other abnormalities of gait and mobility  Difficulty in walking, not elsewhere classified     Problem List Patient Active Problem List   Diagnosis Date Noted  . History of mitral valve repair 11/26/2018  . Prosthetic valve dysfunction 11/25/2018  . Ectatic abdominal aorta ([place]) 11/25/2018  . 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 History of bladder cancer   . History of pulmonary embolus (PE)   . Acute on chronic diastolic heart failure ([place]) 04/23/2018  . History of aortic valve replacement 04/23/2018  . Atrial fibrillation ([place]) 04/23/2018  . Coronary atherosclerosis of native coronary artery 02/25/2014  .  Hyperlipidemia 02/25/2014  .  Embolism and thrombosis ([place]) 03/11/2013    Eric [name] 05/21/2019, 12:39 PM  [facility] Physical Therapy [address] [place], [name], [address] Phone: [phone]   Fax:  [phone]  Name: Eric [name] MRN: [id number] Date of Birth: 03/01/1930

## 2019-05-25 ENCOUNTER — Other Ambulatory Visit: Payer: Self-pay

## 2019-05-25 MED ORDER — EZETIMIBE 10 MG PO TABS: 10.0000 mg | ORAL_TABLET | Freq: Every evening | ORAL | 3 refills | Status: DC

## 2019-05-25 MED ORDER — APIXABAN 5 MG PO TABS: 5.0000 mg | ORAL_TABLET | Freq: Two times a day (BID) | ORAL | 1 refills | Status: DC

## 2019-05-25 MED ORDER — LOSARTAN POTASSIUM 25 MG PO TABS: 25.0000 mg | ORAL_TABLET | Freq: Every day | ORAL | 3 refills | Status: DC

## 2019-05-25 MED ORDER — ATORVASTATIN CALCIUM 40 MG PO TABS: 40.0000 mg | ORAL_TABLET | Freq: Every evening | ORAL | 3 refills | Status: DC

## 2019-05-25 MED ORDER — FUROSEMIDE 20 MG PO TABS: 20.0000 mg | ORAL_TABLET | ORAL | 3 refills | Status: DC

## 2019-05-25 MED ORDER — METOPROLOL TARTRATE 25 MG PO TABS: 12.5000 mg | ORAL_TABLET | Freq: Two times a day (BID) | ORAL | 3 refills | Status: DC

## 2019-05-25 NOTE — Telephone Encounter (Signed)
Pt last saw Dr Irish Lack 04/27/19, last labs 04/27/19 Creat 1.07, age 86, weight 84.8kg, based on specified criteria pt is on appropriate dosage of Eliquis 5mg  BID. Will refill rx.

## 2019-05-25 NOTE — Telephone Encounter (Signed)
Pt would like all medications to be sent to [facility]

## 2019-06-01 ENCOUNTER — Ambulatory Visit: Payer: Medicare Other | Attending: Internal Medicine

## 2019-06-01 DIAGNOSIS — Z23 Encounter for immunization: Secondary | ICD-10-CM | POA: Insufficient documentation

## 2019-06-01 NOTE — Progress Notes (Signed)
   Covid-19 Vaccination Clinic  Name:  LON BERNER    MRN: [id number] DOB: 13-Apr-1930  06/01/2019  Mr. Broner was observed post Covid-19 immunization for 15 minutes without incidence. He was provided with Vaccine Information Sheet and instruction to access the V-Safe system.   Mr. Saco was instructed to call 911 with any severe reactions post vaccine: Marland Kitchen Difficulty breathing  . Swelling of your face and throat  . A fast heartbeat  . A bad rash all over your body  . Dizziness and weakness    Immunizations Administered    Name Date Dose VIS Date Route   Pfizer COVID-19 Vaccine 06/01/2019 11:56 AM 0.3 mL 05/01/2019 Intramuscular   Manufacturer: [place]   Lot: [id number]   [place]: [id number]

## 2019-06-04 ENCOUNTER — Other Ambulatory Visit: Payer: Self-pay

## 2019-06-04 ENCOUNTER — Ambulatory Visit (INDEPENDENT_AMBULATORY_CARE_PROVIDER_SITE_OTHER): Payer: Medicare Other | Admitting: Physical Therapy

## 2019-06-04 DIAGNOSIS — R2689 Other abnormalities of gait and mobility: Secondary | ICD-10-CM

## 2019-06-04 DIAGNOSIS — R262 Difficulty in walking, not elsewhere classified: Secondary | ICD-10-CM

## 2019-06-04 NOTE — Therapy (Signed)
[facility] Physical Therapy [address] [place], [name], [address] Phone: [phone]   Fax:  [phone]  Physical Therapy Treatment  Patient Details  Name: Eric [name] MRN: [id number] Date of Birth: March 05, 1930 Referring Provider (PT): [name], MD (PCP)   Encounter Date: 06/04/2019  PT End of Session - 06/04/19 0850    Visit Number  13    Number of Visits  20   recert and progres note performed 04/28/19 to extend [place] 12 more visits for 6 weeks   Date for PT Re-Evaluation  06/09/19    Authorization Type  [employer] MCR (did progress note visit 9 as his POC was up, next progress note needed at visit 19    PT Start Time  0805    PT Stop Time  0845    PT Time Calculation (min)  40 min    Activity Tolerance  Patient tolerated treatment well    Behavior During Therapy  [facility] for tasks assessed/performed       Past Medical History:  Diagnosis Date  . Atrial flutter, paroxysmal ([place])    a. dx 04-02-2014, s/p  successful cardioversion 04-06-2014.  Marland Kitchen Chronic anticoagulation    on Eliquis--  due to recurrent dvt's and pe's  . Dyslipidemia   . History of bladder cancer urologist-  dr [name]   02-13-2016  s/p TURBT per path high grade papillary urothelial carcinoma  . History of DVT (deep vein thrombosis)    2000-- RLE  . History of melanoma excision    left flank;  07/ 2014 left lower leg and right upper chest  . History of prostate cancer    Gleason 6--  s/p  radical prostatectomy 06/ 2000 in [place]---  no recurrence  . History of pulmonary embolus (PE)    2000 & 2005  bilateral  . Hx of valvuloplasty 06/19/2007   a. s/p mitral ring annuloplasty, repair ruptured chordae of P2 flail segments of MV  . S/P aortic valve replacement with bioprosthetic valve    06-19-2007  severe aortic valve stenosis  . 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 Single vessel coronary artery disease   cardiologist-  dr Irish Lack   a. 05/2007 CABG x 1: s/p SVG-OM;  b. 10/2010 Ex  MV: EF 72%, inf attenuation w/o ischemia, brief run of PAT with exercise. To  . Thrombocytopenia ([place])   . Thyroid goiter 2013   nodular  . Wears hearing aid    BILATERAL  . Wears partial dentures    UPPER    Past Surgical History:  Procedure Laterality Date  . AORTIC VALVE REPLACEMENT (AVR)/CORONARY ARTERY BYPASS GRAFTING (CABG)  06-19-2007  dr [name]   SVG to OM1 ;   AVR w/ #23 Mitralflow pericardial and ligation left atrial appendage;  MV repair w/ #28 Sorin 3-D memo Ring Annuloplasty with repair ruptured chordar of P-2 flail segments  . CARDIAC CATHETERIZATION  05-12-2007  dr Leonia Reeves   single vessel 30% ostial LAD,  80% LCx;  severe to critial AS,  moderate MR,  normal LVF, ef 70%,  normal right heart pressures and cardiac outputs,  mild elevated LV end-diastolic pressure    . CARDIOVASCULAR STRESS TEST  11-02-2010  dr Irish Lack   normal nuclear study w/ no ischemia or infarct/scar/  normal LV function and wall motion , ef 72%  . CARDIOVERSION N/A 04/06/2014   Procedure: CARDIOVERSION;  Surgeon: [name], MD;  Location: [place];  Service: Cardiovascular;  Laterality: N/A;   successful  .  CATARACT EXTRACTION W/ INTRAOCULAR LENS  IMPLANT, BILATERAL  2012  approx  . CORONARY ARTERY BYPASS GRAFT    . RETROPUBIC RADICAL PROSTATECTOMY  06/ 2000  in [place], [place]  . RIGHT/LEFT HEART CATH AND CORONARY/GRAFT ANGIOGRAPHY N/A 10/29/2018   Procedure: RIGHT/LEFT HEART CATH AND CORONARY/GRAFT ANGIOGRAPHY;  Surgeon: [name], MD;  Location: [place] CV LAB;  Service: Cardiovascular;  Laterality: N/A;  . ROTATOR CUFF REPAIR Left 09/1998  . TEE WITHOUT CARDIOVERSION N/A 04/06/2014   Procedure: TRANSESOPHAGEAL ECHOCARDIOGRAM (TEE);  Surgeon: [name], MD;  Location: [facility] ENDOSCOPY;  Service: Cardiovascular;  Laterality: N/A;  mild focal basa LVH of the septum, ef 50-55%, s/p AV annuloplasty ring, elongated chordae, thickened leaflets, mild to mod. MR, multiple small  jets/arotic bioprosthetic valve sits well in position, mild AI/ thickened TV w/ mild reurg./mild LA  . TEE WITHOUT CARDIOVERSION N/A 10/22/2018   Procedure: TRANSESOPHAGEAL ECHOCARDIOGRAM (TEE);  Surgeon: [name], MD;  Location: [facility] ENDOSCOPY;  Service: Cardiovascular;  Laterality: N/A;  . TEE WITHOUT CARDIOVERSION N/A 11/25/2018   Procedure: TRANSESOPHAGEAL ECHOCARDIOGRAM (TEE);  Surgeon: [name], MD;  Location: [place];  Service: Open Heart Surgery;  Laterality: N/A;  . TRANSCATHETER AORTIC VALVE REPLACEMENT, TRANSFEMORAL N/A 11/25/2018   Procedure: TRANSCATHETER AORTIC VALVE REPLACEMENT, TRANSFEMORAL;  Surgeon: [name], MD;  Location: [place];  Service: Open Heart Surgery;  Laterality: N/A;  . TRANSTHORACIC ECHOCARDIOGRAM  03-23-2016   dr Irish Lack   EF 55-60%, reduced contribution atrial contraction to ventricular filling, due to increased ventricular diastolic pressure or atrial contratile dysfunction/  bioprosthetic AV w/ mod. regurg. (valve area 1.97cm^2)/ mild dilated ascending aorta/ mild thicken MV normal function annular ring prosthesis/severe LAE & mod. to sev.RAE/ PASP 67mmHg/ mild TR/ ventricle septal motion show paradox  . TRANSURETHRAL RESECTION OF BLADDER TUMOR N/A 02/13/2016   Procedure: TRANSURETHRAL RESECTION OF BLADDER TUMOR (TURBT);  Surgeon: Nickie Retort, MD;  Location: [facility];  Service: Urology;  Laterality: N/A;  . TRANSURETHRAL RESECTION OF BLADDER TUMOR N/A 09/04/2016   Procedure: TRANSURETHRAL RESECTION OF BLADDER TUMOR (TURBT);  Surgeon: Nickie Retort, MD;  Location: [facility];  Service: Urology;  Laterality: N/A;    There were no vitals filed for this visit.  Subjective Assessment - 06/04/19 0818    Subjective  Relays he got covid vaccination and his arm is a little sore, no other complaints. He still feels off balance at times but no major stumbles or no falls. Denies pain today    Pertinent History  PMH: CABG  10/29/18, aortic valve replacment (TAVR) 11/2018, bladder and prostate CA,CAD, RTC repair 09/1998 on Lt    Limitations  Walking;House hold activities;Lifting;Standing    How long can you stand comfortably?  20    How long can you walk comfortably?  can walk through supermarket, 2-3 blocks.    Patient Stated Goals  improve balance                       [facility] Adult PT Treatment/Exercise - 06/04/19 0001      High Level Balance   High Level Balance Comments  in bars for foam beam lateral walking and fwd walking up/down X 5 reps,  rocker board for ant-post and lateral       Exercises   Other Exercises   Bike for endurance L3X48min UE/LE. Leg press at shuttle 87 lbs 2X20 reps. Farmers carry 12 lb KB one lap in right hand, one lap  in Lt hand. Step ups without UE support 8 inch step X 15 reps forward then lateral. KB swings 5 lbs X 15 reps, unitlat KB clean from floor X 10 reps each side with 5 lb wt Bent over row with 12 lb KB X 20 reps.                 PT Short Term Goals - 04/28/19 0944      PT SHORT TERM GOAL #1   Title  Pt will be I and compliant with initial HEP. 4 weeks 04/15/19    Status  Achieved      PT SHORT TERM GOAL #2   Title  Pt will improve BERG balance score to at least 48. 4 weeks 03/15/19    Status  Achieved        PT Long Term Goals - 04/28/19 0944      PT LONG TERM GOAL #1   Title  Pt will be I and compliant with HEP. (target for all goals 6 weeks 04/27/19)    Status  On-going      PT LONG TERM GOAL #2   Title  Pt will improv BERG balance score >50 to show improved balance    Status  Achieved      PT LONG TERM GOAL #3   Title  Pt will improve FGA to at least 24 to show improved balance.    Status  On-going      PT LONG TERM GOAL #4   Title  Pt will improve gait speed in 20 ft walk test from 6.5 sec to less than 6 sec.    Status  On-going            Plan - 06/04/19 [id number]    Clinical Impression Statement  Progressed back lumbar  strength due to no complaints of pain or soreness. Session focused on general strength and conditioning with balance training with good overall tolerance. PT will continue to progress as able toward his functional goals.    Comorbidities  PMH: CABG 10/29/18, aortic valve replacment (TAVR) 11/2018, bladder and prostate CA,CAD, RTC repair 09/1998 on Lt    Examination-Activity Limitations  Carry;Squat;Lift;Stand;Locomotion Level    Examination-Participation Restrictions  Community Activity;Yard Work;Shop    Stability/Clinical Decision Making  Evolving/Moderate complexity    Rehab Potential  Good    PT Frequency  2x / week    PT Duration  6 weeks    PT Treatment/Interventions  ADLs/Self Care Home Management;Aquatic Therapy;Gait training;Stair training;Functional mobility training;Therapeutic activities;Therapeutic exercise;Balance training;Neuromuscular re-education;Manual techniques;Orthotic Fit/Training;Taping    PT Next Visit Plan  needs higher level balance challenges, leg strength and endurance.    PT Home Exercise Plan  Access Code: [email or web address] and Agree with Plan of Care  Patient       Patient will benefit from skilled therapeutic intervention in order to improve the following deficits and impairments:  Abnormal gait, Cardiopulmonary status limiting activity, Decreased activity tolerance, Decreased balance, Decreased endurance, Decreased strength, Difficulty walking  Visit Diagnosis: Other abnormalities of gait and mobility  Difficulty in walking, not elsewhere classified     Problem List Patient Active Problem List   Diagnosis Date Noted  . History of mitral valve repair 11/26/2018  . Prosthetic valve dysfunction 11/25/2018  . Ectatic abdominal aorta ([place]) 11/25/2018  . 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 History of bladder cancer   . History of pulmonary embolus (PE)   . Acute on chronic diastolic heart failure (  [place]) 04/23/2018  . History of aortic valve replacement  04/23/2018  . Atrial fibrillation ([place]) 04/23/2018  . Coronary atherosclerosis of native coronary artery 02/25/2014  . Hyperlipidemia 02/25/2014  . Embolism and thrombosis [facility]) 03/11/2013    Eric [name] 06/04/2019, 8:53 AM  [facility] Physical Therapy [address] [place], [name], [address] Phone: [phone]   Fax:  [phone]  Name: Eric [name] MRN: [id number] Date of Birth: Jul 03, 1929

## 2019-06-05 ENCOUNTER — Encounter: Payer: Medicare Other | Admitting: Physical Therapy

## 2019-06-05 ENCOUNTER — Ambulatory Visit (INDEPENDENT_AMBULATORY_CARE_PROVIDER_SITE_OTHER): Payer: Medicare Other | Admitting: Physical Therapy

## 2019-06-05 DIAGNOSIS — R2689 Other abnormalities of gait and mobility: Secondary | ICD-10-CM | POA: Diagnosis not present

## 2019-06-05 DIAGNOSIS — R262 Difficulty in walking, not elsewhere classified: Secondary | ICD-10-CM | POA: Diagnosis not present

## 2019-06-05 NOTE — Therapy (Signed)
[facility] Physical Therapy [address] [place], [name], [address] Phone: [phone]   Fax:  [phone]  Physical Therapy Treatment  Patient Details  Name: Eric [name] MRN: [id number] Date of Birth: Oct 03, 1929 Referring Provider (PT): [name], MD (PCP)   Encounter Date: 06/05/2019  PT End of Session - 06/05/19 0948    Visit Number  14    Number of Visits  20   recert and progres note performed 04/28/19 to extend [place] 12 more visits for 6 weeks   Date for PT Re-Evaluation  06/09/19    Authorization Type  [employer] MCR (did progress note visit 9 as his POC was up, next progress note needed at visit 19    PT Start Time  0847    PT Stop Time  0930    PT Time Calculation (min)  43 min    Activity Tolerance  Patient tolerated treatment well    Behavior During Therapy  [facility] for tasks assessed/performed       Past Medical History:  Diagnosis Date  . Atrial flutter, paroxysmal ([place])    a. dx 04-02-2014, s/p  successful cardioversion 04-06-2014.  Marland Kitchen Chronic anticoagulation    on Eliquis--  due to recurrent dvt's and pe's  . Dyslipidemia   . History of bladder cancer urologist-  dr [name]   02-13-2016  s/p TURBT per path high grade papillary urothelial carcinoma  . History of DVT (deep vein thrombosis)    2000-- RLE  . History of melanoma excision    left flank;  07/ 2014 left lower leg and right upper chest  . History of prostate cancer    Gleason 6--  s/p  radical prostatectomy 06/ 2000 in [place]---  no recurrence  . History of pulmonary embolus (PE)    2000 & 2005  bilateral  . Hx of valvuloplasty 06/19/2007   a. s/p mitral ring annuloplasty, repair ruptured chordae of P2 flail segments of MV  . S/P aortic valve replacement with bioprosthetic valve    06-19-2007  severe aortic valve stenosis  . 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 Single vessel coronary artery disease   cardiologist-  dr Irish Lack   a. 05/2007 CABG x 1: s/p SVG-OM;  b. 10/2010 Ex  MV: EF 72%, inf attenuation w/o ischemia, brief run of PAT with exercise. To  . Thrombocytopenia ([place])   . Thyroid goiter 2013   nodular  . Wears hearing aid    BILATERAL  . Wears partial dentures    UPPER    Past Surgical History:  Procedure Laterality Date  . AORTIC VALVE REPLACEMENT (AVR)/CORONARY ARTERY BYPASS GRAFTING (CABG)  06-19-2007  dr [name]   SVG to OM1 ;   AVR w/ #23 Mitralflow pericardial and ligation left atrial appendage;  MV repair w/ #28 Sorin 3-D memo Ring Annuloplasty with repair ruptured chordar of P-2 flail segments  . CARDIAC CATHETERIZATION  05-12-2007  dr Leonia Reeves   single vessel 30% ostial LAD,  80% LCx;  severe to critial AS,  moderate MR,  normal LVF, ef 70%,  normal right heart pressures and cardiac outputs,  mild elevated LV end-diastolic pressure    . CARDIOVASCULAR STRESS TEST  11-02-2010  dr Irish Lack   normal nuclear study w/ no ischemia or infarct/scar/  normal LV function and wall motion , ef 72%  . CARDIOVERSION N/A 04/06/2014   Procedure: CARDIOVERSION;  Surgeon: [name], MD;  Location: [place];  Service: Cardiovascular;  Laterality: N/A;   successful  .  CATARACT EXTRACTION W/ INTRAOCULAR LENS  IMPLANT, BILATERAL  2012  approx  . CORONARY ARTERY BYPASS GRAFT    . RETROPUBIC RADICAL PROSTATECTOMY  06/ 2000  in [place]  . RIGHT/LEFT HEART CATH AND CORONARY/GRAFT ANGIOGRAPHY N/A 10/29/2018   Procedure: RIGHT/LEFT HEART CATH AND CORONARY/GRAFT ANGIOGRAPHY;  Surgeon: [name], MD;  Location: [place] CV LAB;  Service: Cardiovascular;  Laterality: N/A;  . ROTATOR CUFF REPAIR Left 09/1998  . TEE WITHOUT CARDIOVERSION N/A 04/06/2014   Procedure: TRANSESOPHAGEAL ECHOCARDIOGRAM (TEE);  Surgeon: [name], MD;  Location: [facility] ENDOSCOPY;  Service: Cardiovascular;  Laterality: N/A;  mild focal basa LVH of the septum, ef 50-55%, s/p AV annuloplasty ring, elongated chordae, thickened leaflets, mild to mod. MR, multiple small  jets/arotic bioprosthetic valve sits well in position, mild AI/ thickened TV w/ mild reurg./mild LA  . TEE WITHOUT CARDIOVERSION N/A 10/22/2018   Procedure: TRANSESOPHAGEAL ECHOCARDIOGRAM (TEE);  Surgeon: [name], MD;  Location: [facility] ENDOSCOPY;  Service: Cardiovascular;  Laterality: N/A;  . TEE WITHOUT CARDIOVERSION N/A 11/25/2018   Procedure: TRANSESOPHAGEAL ECHOCARDIOGRAM (TEE);  Surgeon: [name], MD;  Location: [place];  Service: Open Heart Surgery;  Laterality: N/A;  . TRANSCATHETER AORTIC VALVE REPLACEMENT, TRANSFEMORAL N/A 11/25/2018   Procedure: TRANSCATHETER AORTIC VALVE REPLACEMENT, TRANSFEMORAL;  Surgeon: [name], MD;  Location: [place];  Service: Open Heart Surgery;  Laterality: N/A;  . TRANSTHORACIC ECHOCARDIOGRAM  03-23-2016   dr Irish Lack   EF 55-60%, reduced contribution atrial contraction to ventricular filling, due to increased ventricular diastolic pressure or atrial contratile dysfunction/  bioprosthetic AV w/ mod. regurg. (valve area 1.97cm^2)/ mild dilated ascending aorta/ mild thicken MV normal function annular ring prosthesis/severe LAE & mod. to sev.RAE/ PASP 51mmHg/ mild TR/ ventricle septal motion show paradox  . TRANSURETHRAL RESECTION OF BLADDER TUMOR N/A 02/13/2016   Procedure: TRANSURETHRAL RESECTION OF BLADDER TUMOR (TURBT);  Surgeon: Nickie Retort, MD;  Location: [facility];  Service: Urology;  Laterality: N/A;  . TRANSURETHRAL RESECTION OF BLADDER TUMOR N/A 09/04/2016   Procedure: TRANSURETHRAL RESECTION OF BLADDER TUMOR (TURBT);  Surgeon: Nickie Retort, MD;  Location: [facility];  Service: Urology;  Laterality: N/A;    There were no vitals filed for this visit.  Subjective Assessment - 06/05/19 0943    Subjective  no soreness from yestedays session, no complaints upon arrival    Pertinent History  PMH: CABG 10/29/18, aortic valve replacment (TAVR) 11/2018, bladder and prostate CA,CAD, RTC repair 09/1998 on Lt     Limitations  Walking;House hold activities;Lifting;Standing    How long can you stand comfortably?  20    How long can you walk comfortably?  can walk through supermarket, 2-3 blocks.    Patient Stated Goals  improve balance    Currently in Pain?  No/denies         [facility] PT Assessment - 06/05/19 0001      Assessment   Medical Diagnosis  Gait/balance      Balance   Balance Assessed  Yes      High Level Balance   High Level Balance Comments  SLS X 5 reps each side(avg was 4 sec on Lt, 3 sec on Rt), tandem balance on airex foam pad 30 sec X 2 bilat with intermitt UE support PRN       [facility] Adult PT Treatment/Exercise - 06/05/19 0001      Exercises   Other Exercises   Nu step for endurance UE/LE L5X48min UE/LE. Leg press  at shuttle 100 lbs 2X20 reps. Farmers carry 15 lb KB 2 laps in right hand, then swithced to Lt hand. Step ups without UE support 8 inch step X 10 reps forward with 5 lb KB in opposite hand, then lateral (no wt in hand)  x10 reps for lateral stepup/down. KB swings 10 lbs X 20 reps, unitlat KB clean from floor X 10 reps each side with 5 lb wt Bent over row with 15 lb KB X 20 reps.          PT Education - 06/05/19 0948    Education Details  practice more SLS at home for balance    Person(s) Educated  Patient    Methods  Explanation;Demonstration    Comprehension  Verbalized understanding;Returned demonstration       PT Short Term Goals - 04/28/19 0944      PT SHORT TERM GOAL #1   Title  Pt will be I and compliant with initial HEP. 4 weeks 04/15/19    Status  Achieved      PT SHORT TERM GOAL #2   Title  Pt will improve BERG balance score to at least 48. 4 weeks 03/15/19    Status  Achieved        PT Long Term Goals - 04/28/19 0944      PT LONG TERM GOAL #1   Title  Pt will be I and compliant with HEP. (target for all goals 6 weeks 04/27/19)    Status  On-going      PT LONG TERM GOAL #2   Title  Pt will improv BERG balance score >50 to show improved  balance    Status  Achieved      PT LONG TERM GOAL #3   Title  Pt will improve FGA to at least 24 to show improved balance.    Status  On-going      PT LONG TERM GOAL #4   Title  Pt will improve gait speed in 20 ft walk test from 6.5 sec to less than 6 sec.    Status  On-going            Plan - 06/05/19 0946    Clinical Impression Statement  SLS avg has improved slightly to nwo 4 seconds on his Lt, 3 seconds on his right, he was encouraged to practice SLS more at home to improve overall balance. Balance is still somewhat inconsistent but he shows improved hip/ankle/stepping strategy which makes him less likely to fall and improves his overall safety. He was able to progress his overall strength and conditioning today without complaints.    Comorbidities  PMH: CABG 10/29/18, aortic valve replacment (TAVR) 11/2018, bladder and prostate CA,CAD, RTC repair 09/1998 on Lt    Examination-Activity Limitations  Carry;Squat;Lift;Stand;Locomotion Level    Examination-Participation Restrictions  Community Activity;Yard Work;Shop    Stability/Clinical Decision Making  Evolving/Moderate complexity    Rehab Potential  Good    PT Frequency  2x / week    PT Duration  6 weeks    PT Treatment/Interventions  ADLs/Self Care Home Management;Aquatic Therapy;Gait training;Stair training;Functional mobility training;Therapeutic activities;Therapeutic exercise;Balance training;Neuromuscular re-education;Manual techniques;Orthotic Fit/Training;Taping    PT Next Visit Plan  continue to progress higher level balance challenges, overall  strength and endurance.    PT Home Exercise Plan  Access Code: [email or web address] and Agree with Plan of Care  Patient       Patient will benefit from skilled therapeutic intervention in order to  improve the following deficits and impairments:  Abnormal gait, Cardiopulmonary status limiting activity, Decreased activity tolerance, Decreased balance, Decreased endurance, Decreased  strength, Difficulty walking  Visit Diagnosis: Other abnormalities of gait and mobility  Difficulty in walking, not elsewhere classified     Problem List Patient Active Problem List   Diagnosis Date Noted  . History of mitral valve repair 11/26/2018  . Prosthetic valve dysfunction 11/25/2018  . Ectatic abdominal aorta ([place]) 11/25/2018  . 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 History of bladder cancer   . History of pulmonary embolus (PE)   . Acute on chronic diastolic heart failure ([place]) 04/23/2018  . History of aortic valve replacement 04/23/2018  . Atrial fibrillation ([place]) 04/23/2018  . Coronary atherosclerosis of native coronary artery 02/25/2014  . Hyperlipidemia 02/25/2014  . Embolism and thrombosis [facility]) 03/11/2013    Silvestre Mesi 06/05/2019, 9:49 AM  [facility] Physical Therapy [address] [place], [name], [address] Phone: [phone]   Fax:  [phone]  Name: FRANCISCOJAVIER CASAUS MRN: [id number] Date of Birth: 1930-02-14

## 2019-06-09 ENCOUNTER — Ambulatory Visit (INDEPENDENT_AMBULATORY_CARE_PROVIDER_SITE_OTHER): Payer: Medicare Other | Admitting: Physical Therapy

## 2019-06-09 ENCOUNTER — Other Ambulatory Visit: Payer: Self-pay

## 2019-06-09 DIAGNOSIS — R2689 Other abnormalities of gait and mobility: Secondary | ICD-10-CM

## 2019-06-09 DIAGNOSIS — R262 Difficulty in walking, not elsewhere classified: Secondary | ICD-10-CM

## 2019-06-09 NOTE — Therapy (Signed)
[facility] Physical Therapy [address] [place], [name], [address] Phone: [phone]   Fax:  [phone]  Physical Therapy Treatment  Patient Details  Name: Eric [name] MRN: [id number] Date of Birth: 07-23-1929 Referring Provider (PT): [name], MD (PCP)   Encounter Date: 06/09/2019  PT End of Session - 06/09/19 1117    Visit Number  15    Number of Visits  20   recert and progres note performed 04/28/19 to extend [place] 12 more visits for 6 weeks   Date for PT Re-Evaluation  06/09/19    Authorization Type  [employer] MCR (did progress note visit 9 as his POC was up, next progress note needed at visit 19    PT Start Time  1015    PT Stop Time  1055    PT Time Calculation (min)  40 min    Activity Tolerance  Patient tolerated treatment well    Behavior During Therapy  [facility] for tasks assessed/performed       Past Medical History:  Diagnosis Date  . Atrial flutter, paroxysmal ([place])    a. dx 04-02-2014, s/p  successful cardioversion 04-06-2014.  Marland Kitchen Chronic anticoagulation    on Eliquis--  due to recurrent dvt's and pe's  . Dyslipidemia   . History of bladder cancer urologist-  dr [name]   02-13-2016  s/p TURBT per path high grade papillary urothelial carcinoma  . History of DVT (deep vein thrombosis)    2000-- RLE  . History of melanoma excision    left flank;  07/ 2014 left lower leg and right upper chest  . History of prostate cancer    Gleason 6--  s/p  radical prostatectomy 06/ 2000 in [place]---  no recurrence  . History of pulmonary embolus (PE)    2000 & 2005  bilateral  . Hx of valvuloplasty 06/19/2007   a. s/p mitral ring annuloplasty, repair ruptured chordae of P2 flail segments of MV  . S/P aortic valve replacement with bioprosthetic valve    06-19-2007  severe aortic valve stenosis  . 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 Single vessel coronary artery disease   cardiologist-  dr Irish Lack   a. 05/2007 CABG x 1: s/p SVG-OM;  b. 10/2010 Ex  MV: EF 72%, inf attenuation w/o ischemia, brief run of PAT with exercise. To  . Thrombocytopenia ([place])   . Thyroid goiter 2013   nodular  . Wears hearing aid    BILATERAL  . Wears partial dentures    UPPER    Past Surgical History:  Procedure Laterality Date  . AORTIC VALVE REPLACEMENT (AVR)/CORONARY ARTERY BYPASS GRAFTING (CABG)  06-19-2007  dr [name]   SVG to OM1 ;   AVR w/ #23 Mitralflow pericardial and ligation left atrial appendage;  MV repair w/ #28 Sorin 3-D memo Ring Annuloplasty with repair ruptured chordar of P-2 flail segments  . CARDIAC CATHETERIZATION  05-12-2007  dr [place] Reeves   single vessel 30% ostial LAD,  80% LCx;  severe to critial AS,  moderate MR,  normal LVF, ef 70%,  normal right heart pressures and cardiac outputs,  mild elevated LV end-diastolic pressure    . CARDIOVASCULAR STRESS TEST  11-02-2010  dr Irish Lack   normal nuclear study w/ no ischemia or infarct/scar/  normal LV function and wall motion , ef 72%  . CARDIOVERSION N/A 04/06/2014   Procedure: CARDIOVERSION;  Surgeon: [name], MD;  Location: [place];  Service: Cardiovascular;  Laterality: N/A;   successful  .  CATARACT EXTRACTION W/ INTRAOCULAR LENS  IMPLANT, BILATERAL  2012  approx  . CORONARY ARTERY BYPASS GRAFT    . RETROPUBIC RADICAL PROSTATECTOMY  06/ 2000  in [place], [place]  . RIGHT/LEFT HEART CATH AND CORONARY/GRAFT ANGIOGRAPHY N/A 10/29/2018   Procedure: RIGHT/LEFT HEART CATH AND CORONARY/GRAFT ANGIOGRAPHY;  Surgeon: [name], MD;  Location: [place] [facility];  Service: Cardiovascular;  Laterality: N/A;  . ROTATOR CUFF REPAIR Left 09/1998  . TEE WITHOUT CARDIOVERSION N/A 04/06/2014   Procedure: TRANSESOPHAGEAL ECHOCARDIOGRAM (TEE);  Surgeon: [name], MD;  Location: [facility] ENDOSCOPY;  Service: Cardiovascular;  Laterality: N/A;  mild focal basa LVH of the septum, ef 50-55%, s/p AV annuloplasty ring, elongated chordae, thickened leaflets, mild to mod. MR, multiple small  jets/arotic bioprosthetic valve sits well in position, mild AI/ thickened TV w/ mild reurg./mild LA  . TEE WITHOUT CARDIOVERSION N/A 10/22/2018   Procedure: TRANSESOPHAGEAL ECHOCARDIOGRAM (TEE);  Surgeon: [name], MD;  Location: [facility] ENDOSCOPY;  Service: Cardiovascular;  Laterality: N/A;  . TEE WITHOUT CARDIOVERSION N/A 11/25/2018   Procedure: TRANSESOPHAGEAL ECHOCARDIOGRAM (TEE);  Surgeon: [name], MD;  Location: [place];  Service: Open Heart Surgery;  Laterality: N/A;  . TRANSCATHETER AORTIC VALVE REPLACEMENT, TRANSFEMORAL N/A 11/25/2018   Procedure: TRANSCATHETER AORTIC VALVE REPLACEMENT, TRANSFEMORAL;  Surgeon: [name], MD;  Location: [place];  Service: Open Heart Surgery;  Laterality: N/A;  . TRANSTHORACIC ECHOCARDIOGRAM  03-23-2016   dr Irish Lack   EF 55-60%, reduced contribution atrial contraction to ventricular filling, due to increased ventricular diastolic pressure or atrial contratile dysfunction/  bioprosthetic AV w/ mod. regurg. (valve area 1.97cm^2)/ mild dilated ascending aorta/ mild thicken MV normal function annular ring prosthesis/severe LAE & mod. to sev.RAE/ PASP 89mmHg/ mild TR/ ventricle septal motion show paradox  . TRANSURETHRAL RESECTION OF BLADDER TUMOR N/A 02/13/2016   Procedure: TRANSURETHRAL RESECTION OF BLADDER TUMOR (TURBT);  Surgeon: Nickie Retort, MD;  Location: [facility];  Service: Urology;  Laterality: N/A;  . TRANSURETHRAL RESECTION OF BLADDER TUMOR N/A 09/04/2016   Procedure: TRANSURETHRAL RESECTION OF BLADDER TUMOR (TURBT);  Surgeon: Nickie Retort, MD;  Location: [facility];  Service: Urology;  Laterality: N/A;    There were no vitals filed for this visit.  Subjective Assessment - 06/09/19 1114    Subjective  no pain today or complaints    Pertinent History  PMH: CABG 10/29/18, aortic valve replacment (TAVR) 11/2018, bladder and prostate CA,CAD, RTC repair 09/1998 on Lt    Limitations  Walking;House hold  activities;Lifting;Standing    How long can you stand comfortably?  20    How long can you walk comfortably?  can walk through supermarket, 2-3 blocks.    Patient Stated Goals  improve balance    Currently in Pain?  No/denies       [facility] Adult PT Treatment/Exercise - 06/09/19 0001      High Level Balance   High Level Balance Comments  in bars: tandem walk fwd and retro up/down X 3 times, moved out of bars for stepping over hurldles 5 min  needs overall min A for balance with this      Exercises   Other Exercises   bike for endurance L5X59mi.  Leg press at shuttle 106 lbs 2X20 reps. Farmers carry 15 lb KB 2 laps in right hand, then swithced to Lt hand. Step ups without UE support 8 inch step X 15 reps forward with 5 lb KB in opposite han. KB swings 10 lbs X  20 reps, unitlat KB clean from floor X 10 reps each side with 5 lb wt Bent over row with 15 lb KB X 20 reps.  Standing rows/ext with green X 20 ea               PT Short Term Goals - 04/28/19 0944      PT SHORT TERM GOAL #1   Title  Pt will be I and compliant with initial HEP. 4 weeks 04/15/19    Status  Achieved      PT SHORT TERM GOAL #2   Title  Pt will improve BERG balance score to at least 48. 4 weeks 03/15/19    Status  Achieved        PT Long Term Goals - 04/28/19 0944      PT LONG TERM GOAL #1   Title  Pt will be I and compliant with HEP. (target for all goals 6 weeks 04/27/19)    Status  On-going      PT LONG TERM GOAL #2   Title  Pt will improv BERG balance score >50 to show improved balance    Status  Achieved      PT LONG TERM GOAL #3   Title  Pt will improve FGA to at least 24 to show improved balance.    Status  On-going      PT LONG TERM GOAL #4   Title  Pt will improve gait speed in 20 ft walk test from 6.5 sec to less than 6 sec.    Status  On-going            Plan - 06/09/19 1118    Clinical Impression Statement  Continued with slight progression of funcitonal strength, endurance, and  balance today without good tolerance and without complaints. He is slowly making progress in all areas and will continue to benefit from PT.    Comorbidities  PMH: CABG 10/29/18, aortic valve replacment (TAVR) 11/2018, bladder and prostate CA,CAD, RTC repair 09/1998 on Lt    Examination-Activity Limitations  Carry;Squat;Lift;Stand;Locomotion Level    Examination-Participation Restrictions  Community Activity;Yard Work;Shop    Stability/Clinical Decision Making  Evolving/Moderate complexity    Rehab Potential  Good    PT Frequency  2x / week    PT Duration  6 weeks    PT Treatment/Interventions  ADLs/Self Care Home Management;Aquatic Therapy;Gait training;Stair training;Functional mobility training;Therapeutic activities;Therapeutic exercise;Balance training;Neuromuscular re-education;Manual techniques;Orthotic Fit/Training;Taping    PT Next Visit Plan  needs recert, continue to progress higher level balance challenges, overall  strength and endurance.    PT Home Exercise Plan  Access Code: [email or web address] and Agree with Plan of Care  Patient       Patient will benefit from skilled therapeutic intervention in order to improve the following deficits and impairments:  Abnormal gait, Cardiopulmonary status limiting activity, Decreased activity tolerance, Decreased balance, Decreased endurance, Decreased strength, Difficulty walking  Visit Diagnosis: Other abnormalities of gait and mobility  Difficulty in walking, not elsewhere classified     Problem List Patient Active Problem List   Diagnosis Date Noted  . History of mitral valve repair 11/26/2018  . Prosthetic valve dysfunction 11/25/2018  . Ectatic abdominal aorta ([place]) 11/25/2018  . 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 History of bladder cancer   . History of pulmonary embolus (PE)   . Acute on chronic diastolic heart failure ([place]) 04/23/2018  . History of aortic valve replacement 04/23/2018  . Atrial fibrillation ([place])  04/23/2018  . Coronary atherosclerosis of native coronary artery 02/25/2014  . Hyperlipidemia 02/25/2014  . Embolism and thrombosis [facility]) 03/11/2013    Silvestre Mesi 06/09/2019, 11:21 AM  [facility] Physical Therapy [address] [place], [name], [address] Phone: [phone]   Fax:  [phone]  Name: Eric [name] MRN: [id number] Date of Birth: 1929-06-01

## 2019-06-11 ENCOUNTER — Encounter: Payer: Medicare Other | Admitting: Physical Therapy

## 2019-06-16 ENCOUNTER — Other Ambulatory Visit: Payer: Self-pay

## 2019-06-16 ENCOUNTER — Ambulatory Visit: Payer: Medicare Other | Admitting: Physical Therapy

## 2019-06-16 ENCOUNTER — Telehealth: Payer: Self-pay | Admitting: Interventional Cardiology

## 2019-06-16 VITALS — BP 144/102 | HR 82

## 2019-06-16 DIAGNOSIS — R2689 Other abnormalities of gait and mobility: Secondary | ICD-10-CM

## 2019-06-16 DIAGNOSIS — R262 Difficulty in walking, not elsewhere classified: Secondary | ICD-10-CM

## 2019-06-16 NOTE — Therapy (Signed)
[facility] Physical Therapy [address] [place], [name], [address] Phone: [phone]   Fax:  [phone]  Patient Details  Name: Eric [name] MRN: [id number] Date of Birth: [birth year] Referring Provider:  [name], MD  Encounter Date: 06/16/2019  Subjective Assessment - 06/16/19 1038    Subjective  complaints of chest pain/aches that come and go, feels off and dizzy, feels like he gets mucous buildup.    Pertinent History  PMH: CABG 10/29/18, aortic valve replacment (TAVR) 11/2018, bladder and prostate CA,CAD, RTC repair 09/1998 on Lt    Limitations  Walking;House hold activities;Lifting;Standing    How long can you stand comfortably?  20    How long can you walk comfortably?  can walk through supermarket, 2-3 blocks.    Patient Stated Goals  improve balance      Today's Vitals   06/16/19 1037  BP: (!) 144/102  Pulse: 82  SpO2: 98%   There is no height or weight on file to calculate BMI.  Assessment: Patient arrives with complaints of not feeling well with intermittent chest pains. His BP was taken which was high and PT recommends cancelling his appointment today with recommendation to follow up with cardiologist.    Debbe Odea 06/16/2019, 10:40 AM  [facility] Physical Therapy [address] [place], [name], [address] Phone: [phone]   Fax:  [phone]

## 2019-06-16 NOTE — Telephone Encounter (Signed)
Called and made patient and his wife aware of recommendations below from Dr. Irish Lack. They verbalized understanding and thanked me for the call.

## 2019-06-16 NOTE — Telephone Encounter (Signed)
Pt c/o Syncope: STAT if syncope occurred within 30 minutes and pt complains of lightheadedness High Priority if episode of passing out, completely, today or in last 24 hours   1. Did you pass out today? No- . 2.  3. n is the last time you passed out? Almost passed out, fell in his wife arms   4. Has this occurred multiple times? no  5. Did you have any symptoms prior to passing out? Dizziness, upper left side in his chest, it just does not feel right sometimes it feels like a pulled muscle when he turns the wrong way- breathe funny, not normal- pt went to physical therapist today and they cx his appt and told him he need to see his  Cardiologist- pt wants to be seen pleasenot sleep

## 2019-06-16 NOTE — Telephone Encounter (Signed)
Went to cardiac rehab today and told them he didn't feel right in chest today.  If bend wrong or makes a certain move, feels like something wrong in left chest/"pectoral area".  Goes away until makes that same movement again.  Notices over the last week or so. Does not hurt to take a deep breath.   144/102 cannot reproduce with pushing on it.    Last Tuesday was at cardiac rehab last and did well.  Last Wednesday standing up in kitchen, dizzy prior to, felt weak like was going to fall,  felt like might pass out.  Was able to balance self, sat in chair.  BP right after was 160/90s, came down to 139/94.  Had not been sick at all no vomiting or diarrhea.  Staying hydrated.   Pt aware I will review with Dr. Irish Lack and his nurse and call him back with recommendations.

## 2019-06-16 NOTE — Telephone Encounter (Signed)
Chest pain does not sound cardiac.  SOunds more like chest wall pain.  In terms of the dizziness, unclear why he would have felt that. Continue to monitor BP.  Stay hydrated, eat regularly.  If BP stays high, would likely increase losartan.   JV

## 2019-06-19 ENCOUNTER — Encounter: Payer: Medicare Other | Admitting: Physical Therapy

## 2019-06-20 ENCOUNTER — Ambulatory Visit: Payer: Medicare Other | Attending: Internal Medicine

## 2019-06-20 DIAGNOSIS — Z23 Encounter for immunization: Secondary | ICD-10-CM

## 2019-06-20 NOTE — Progress Notes (Signed)
   Covid-19 Vaccination Clinic  Name:  Eric [name]    MRN: [id number] DOB: 04-25-1930  06/20/2019  [name] was observed post Covid-19 immunization for 15 minutes without incidence. He was provided with Vaccine Information Sheet and instruction to access the V-Safe system.   [name] was instructed to call 911 with any severe reactions post vaccine: Marland Kitchen Difficulty breathing  . Swelling of your face and throat  . A fast heartbeat  . A bad rash all over your body  . Dizziness and weakness    Immunizations Administered    Name Date Dose VIS Date Route   Pfizer COVID-19 Vaccine 06/20/2019 10:32 AM 0.3 mL 05/01/2019 Intramuscular   Manufacturer: [place]   Lot: [id number]   [place]: [id number]

## 2019-06-26 ENCOUNTER — Encounter: Payer: Medicare Other | Admitting: Physical Therapy

## 2019-06-29 ENCOUNTER — Ambulatory Visit (INDEPENDENT_AMBULATORY_CARE_PROVIDER_SITE_OTHER): Payer: Medicare Other | Admitting: Physical Therapy

## 2019-06-29 ENCOUNTER — Other Ambulatory Visit: Payer: Self-pay

## 2019-06-29 DIAGNOSIS — R2689 Other abnormalities of gait and mobility: Secondary | ICD-10-CM | POA: Diagnosis not present

## 2019-06-29 DIAGNOSIS — R262 Difficulty in walking, not elsewhere classified: Secondary | ICD-10-CM | POA: Diagnosis not present

## 2019-06-29 NOTE — Therapy (Signed)
[facility] Physical Therapy [address] [place], [name], [address] Phone: [phone]   Fax:  [phone]  Physical Therapy Treatment  Patient Details  Name: Eric [name] MRN: [id number] Date of Birth: 1929/10/04 Referring Provider (PT): [name], MD (PCP)   Encounter Date: 06/29/2019  PT End of Session - 06/29/19 1331    Visit Number  16    Number of Visits  20   recert and progres note performed 04/28/19 to extend [place] 12 more visits for 6 weeks   Date for PT Re-Evaluation  06/09/19    Authorization Type  [employer] MCR (did progress note visit 9 as his POC was up, next progress note needed at visit 19    PT Start Time  1315    PT Stop Time  1355    PT Time Calculation (min)  40 min    Activity Tolerance  Patient tolerated treatment well    Behavior During Therapy  [facility] for tasks assessed/performed       Past Medical History:  Diagnosis Date  . Atrial flutter, paroxysmal ([place])    a. dx 04-02-2014, s/p  successful cardioversion 04-06-2014.  Marland Kitchen Chronic anticoagulation    on Eliquis--  due to recurrent dvt's and pe's  . Dyslipidemia   . History of bladder cancer urologist-  dr [name]   02-13-2016  s/p TURBT per path high grade papillary urothelial carcinoma  . History of DVT (deep vein thrombosis)    2000-- RLE  . History of melanoma excision    left flank;  07/ 2014 left lower leg and right upper chest  . History of prostate cancer    Gleason 6--  s/p  radical prostatectomy 06/ 2000 in [place]---  no recurrence  . History of pulmonary embolus (PE)    2000 & 2005  bilateral  . Hx of valvuloplasty 06/19/2007   a. s/p mitral ring annuloplasty, repair ruptured chordae of P2 flail segments of MV  . S/P aortic valve replacement with bioprosthetic valve    06-19-2007  severe aortic valve stenosis  . 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 Single vessel coronary artery disease   cardiologist-  dr Irish Lack   a. 05/2007 CABG x 1: s/p SVG-OM;  b. 10/2010 Ex MV:  EF 72%, inf attenuation w/o ischemia, brief run of PAT with exercise. To  . Thrombocytopenia ([place])   . Thyroid goiter 2013   nodular  . Wears hearing aid    BILATERAL  . Wears partial dentures    UPPER    Past Surgical History:  Procedure Laterality Date  . AORTIC VALVE REPLACEMENT (AVR)/CORONARY ARTERY BYPASS GRAFTING (CABG)  06-19-2007  dr [name]   SVG to OM1 ;   AVR w/ #23 Mitralflow pericardial and ligation left atrial appendage;  MV repair w/ #28 Sorin 3-D memo Ring Annuloplasty with repair ruptured chordar of P-2 flail segments  . CARDIAC CATHETERIZATION  05-12-2007  dr Leonia Reeves   single vessel 30% ostial LAD,  80% LCx;  severe to critial AS,  moderate MR,  normal LVF, ef 70%,  normal right heart pressures and cardiac outputs,  mild elevated LV end-diastolic pressure    . CARDIOVASCULAR STRESS TEST  11-02-2010  dr Irish Lack   normal nuclear study w/ no ischemia or infarct/scar/  normal LV function and wall motion , ef 72%  . CARDIOVERSION N/A 04/06/2014   Procedure: CARDIOVERSION;  Surgeon: [name], MD;  Location: [place];  Service: Cardiovascular;  Laterality: N/A;   successful  .  CATARACT EXTRACTION W/ INTRAOCULAR LENS  IMPLANT, BILATERAL  2012  approx  . CORONARY ARTERY BYPASS GRAFT    . RETROPUBIC RADICAL PROSTATECTOMY  06/ 2000  in [place]  . RIGHT/LEFT HEART CATH AND CORONARY/GRAFT ANGIOGRAPHY N/A 10/29/2018   Procedure: RIGHT/LEFT HEART CATH AND CORONARY/GRAFT ANGIOGRAPHY;  Surgeon: [name], MD;  Location: [place] CV LAB;  Service: Cardiovascular;  Laterality: N/A;  . ROTATOR CUFF REPAIR Left 09/1998  . TEE WITHOUT CARDIOVERSION N/A 04/06/2014   Procedure: TRANSESOPHAGEAL ECHOCARDIOGRAM (TEE);  Surgeon: [name], MD;  Location: [facility] ENDOSCOPY;  Service: Cardiovascular;  Laterality: N/A;  mild focal basa LVH of the septum, ef 50-55%, s/p AV annuloplasty ring, elongated chordae, thickened leaflets, mild to mod. MR, multiple small jets/arotic  bioprosthetic valve sits well in position, mild AI/ thickened TV w/ mild reurg./mild LA  . TEE WITHOUT CARDIOVERSION N/A 10/22/2018   Procedure: TRANSESOPHAGEAL ECHOCARDIOGRAM (TEE);  Surgeon: [name], MD;  Location: [facility] ENDOSCOPY;  Service: Cardiovascular;  Laterality: N/A;  . TEE WITHOUT CARDIOVERSION N/A 11/25/2018   Procedure: TRANSESOPHAGEAL ECHOCARDIOGRAM (TEE);  Surgeon: [name], MD;  Location: [place];  Service: Open Heart Surgery;  Laterality: N/A;  . TRANSCATHETER AORTIC VALVE REPLACEMENT, TRANSFEMORAL N/A 11/25/2018   Procedure: TRANSCATHETER AORTIC VALVE REPLACEMENT, TRANSFEMORAL;  Surgeon: [name], MD;  Location: [place];  Service: Open Heart Surgery;  Laterality: N/A;  . TRANSTHORACIC ECHOCARDIOGRAM  03-23-2016   dr Irish Lack   EF 55-60%, reduced contribution atrial contraction to ventricular filling, due to increased ventricular diastolic pressure or atrial contratile dysfunction/  bioprosthetic AV w/ mod. regurg. (valve area 1.97cm^2)/ mild dilated ascending aorta/ mild thicken MV normal function annular ring prosthesis/severe LAE & mod. to sev.RAE/ PASP 29mmHg/ mild TR/ ventricle septal motion show paradox  . TRANSURETHRAL RESECTION OF BLADDER TUMOR N/A 02/13/2016   Procedure: TRANSURETHRAL RESECTION OF BLADDER TUMOR (TURBT);  Surgeon: Nickie Retort, MD;  Location: [facility];  Service: Urology;  Laterality: N/A;  . TRANSURETHRAL RESECTION OF BLADDER TUMOR N/A 09/04/2016   Procedure: TRANSURETHRAL RESECTION OF BLADDER TUMOR (TURBT);  Surgeon: Nickie Retort, MD;  Location[facility];  Service: Urology;  Laterality: N/A;    There were no vitals filed for this visit.  Subjective Assessment - 06/29/19 1330    Subjective  I spoke to PCP who does not feel my chest pains were an issue, I have not been able to get in to see cardiologis    Pertinent History  PMH: CABG 10/29/18, aortic valve replacment (TAVR) 11/2018, bladder and prostate  CA,CAD, RTC repair 09/1998 on Lt    Limitations  Walking;House hold activities;Lifting;Standing    How long can you stand comfortably?  20    How long can you walk comfortably?  can walk through supermarket, 2-3 blocks.    Patient Stated Goals  improve balance                       [facility] Adult PT Treatment/Exercise - 06/29/19 0001      High Level Balance   High Level Balance Comments  in bars: tandem walk fwd and retro up/down X 3 times, SLS X 5 reps ea side with 5 sec avg, balance on airex pad for feet together 1 min progressed to feet apart eyes closed 30 sec X 3      Exercises   Other Exercises   nu step for endurance L5X31min UE/LE.  Postural stretching doorway reaching shoulder up to  top frame for 2 breaths, then other side for 2 breaths, then both arms for 2 breathes. Leg press at shuttle 106 lbs 2X15reps. Farmers carry 15 lb KB 2 laps in right hand, then swithced to Lt hand. Step ups without UE support 8 inch step X 10 reps forward with 5 lb KB in opposite hand. KB deadlift from 8 inch step 10 lbs X 10 reps. Bent over row with 15 lb KB X 15 reps.  Standing rows/ext with green X 20 ea             PT Education - 06/29/19 1359    Education Details  postrual stretching at home    Person(s) Educated  Patient    Methods  Explanation;Demonstration;Verbal cues    Comprehension  Verbalized understanding;Returned demonstration       PT Short Term Goals - 04/28/19 0944      PT SHORT TERM GOAL #1   Title  Pt will be I and compliant with initial HEP. 4 weeks 04/15/19    Status  Achieved      PT SHORT TERM GOAL #2   Title  Pt will improve BERG balance score to at least 48. 4 weeks 03/15/19    Status  Achieved        PT Long Term Goals - 04/28/19 0944      PT LONG TERM GOAL #1   Title  Pt will be I and compliant with HEP. (target for all goals 6 weeks 04/27/19)    Status  On-going      PT LONG TERM GOAL #2   Title  Pt will improv BERG balance score >50 to show  improved balance    Status  Achieved      PT LONG TERM GOAL #3   Title  Pt will improve FGA to at least 24 to show improved balance.    Status  On-going      PT LONG TERM GOAL #4   Title  Pt will improve gait speed in 20 ft walk test from 6.5 sec to less than 6 sec.    Status  On-going            Plan - 06/29/19 1356    Clinical Impression Statement  Eased back into strengthening with intermediated intesity and less reps that where he was previously after he missed a couple weeks due to chest pains and HTN. He had good tolerace and no complaints today. PT will progress as able while monitoring his symptoms.    Comorbidities  PMH: CABG 10/29/18, aortic valve replacment (TAVR) 11/2018, bladder and prostate CA,CAD, RTC repair 09/1998 on Lt    Examination-Activity Limitations  Carry;Squat;Lift;Stand;Locomotion Level    Examination-Participation Restrictions  Community Activity;Yard Work;Shop    Stability/Clinical Decision Making  Evolving/Moderate complexity    Rehab Potential  Good    PT Frequency  2x / week    PT Duration  6 weeks    PT Treatment/Interventions  ADLs/Self Care Home Management;Aquatic Therapy;Gait training;Stair training;Functional mobility training;Therapeutic activities;Therapeutic exercise;Balance training;Neuromuscular re-education;Manual techniques;Orthotic Fit/Training;Taping    PT Next Visit Plan  needs recert, continue to progress higher level balance challenges, overall  strength and endurance.    PT Home Exercise Plan  Access Code: [email or web address] and Agree with Plan of Care  Patient       Patient will benefit from skilled therapeutic intervention in order to improve the following deficits and impairments:  Abnormal gait, Cardiopulmonary status limiting activity, Decreased activity  tolerance, Decreased balance, Decreased endurance, Decreased strength, Difficulty walking  Visit Diagnosis: Other abnormalities of gait and mobility  Difficulty in walking,  not elsewhere classified     Problem List Patient Active Problem List   Diagnosis Date Noted  . History of mitral valve repair 11/26/2018  . Prosthetic valve dysfunction 11/25/2018  . Ectatic abdominal aorta ([place]) 11/25/2018  . 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 History of bladder cancer   . History of pulmonary embolus (PE)   . Acute on chronic diastolic heart failure ([place]) 04/23/2018  . History of aortic valve replacement 04/23/2018  . Atrial fibrillation ([place]) 04/23/2018  . Coronary atherosclerosis of native coronary artery 02/25/2014  . Hyperlipidemia 02/25/2014  . Embolism and thrombosis ([place]) 03/11/2013    Silvestre Mesi 06/29/2019, 2:00 PM  [facility] Physical Therapy [address] [place], [name], [address] Phone: [phone]   Fax:  [phone]  Name: HERI PLUCKER MRN: [id number] Date of Birth: 12-Sep-1929

## 2019-07-06 ENCOUNTER — Encounter: Payer: Self-pay | Admitting: Physical Therapy

## 2019-07-06 ENCOUNTER — Other Ambulatory Visit: Payer: Self-pay

## 2019-07-06 ENCOUNTER — Ambulatory Visit: Payer: Medicare Other | Admitting: Physical Therapy

## 2019-07-06 DIAGNOSIS — R2689 Other abnormalities of gait and mobility: Secondary | ICD-10-CM

## 2019-07-06 DIAGNOSIS — R262 Difficulty in walking, not elsewhere classified: Secondary | ICD-10-CM

## 2019-07-06 NOTE — Therapy (Signed)
[facility] Physical Therapy [address] [place], [name], [address] Phone: [phone]   Fax:  [phone]  Physical Therapy Treatment  Patient Details  Name: Eric [name] MRN: [id number] Date of Birth: 05/07/1930 Referring Provider (PT): [name], MD (PCP)   Encounter Date: 07/06/2019  PT End of Session - 07/06/19 0855    Visit Number  17    Number of Visits  20   recert and progres note performed 04/28/19 to extend [place] 12 more visits for 6 weeks   Date for PT Re-Evaluation  06/09/19    Authorization Type  [employer] MCR (did progress note visit 9 as his POC was up, next progress note needed at visit 19    PT Start Time  0808    PT Stop Time  0850    PT Time Calculation (min)  42 min    Activity Tolerance  Patient tolerated treatment well    Behavior During Therapy  [facility] for tasks assessed/performed       Past Medical History:  Diagnosis Date  . Atrial flutter, paroxysmal ([place])    a. dx 04-02-2014, s/p  successful cardioversion 04-06-2014.  Marland Kitchen Chronic anticoagulation    on Eliquis--  due to recurrent dvt's and pe's  . Dyslipidemia   . History of bladder cancer urologist-  dr [name]   02-13-2016  s/p TURBT per path high grade papillary urothelial carcinoma  . History of DVT (deep vein thrombosis)    2000-- RLE  . History of melanoma excision    left flank;  07/ 2014 left lower leg and right upper chest  . History of prostate cancer    Gleason 6--  s/p  radical prostatectomy 06/ 2000 in [place]---  no recurrence  . History of pulmonary embolus (PE)    2000 & 2005  bilateral  . Hx of valvuloplasty 06/19/2007   a. s/p mitral ring annuloplasty, repair ruptured chordae of P2 flail segments of MV  . S/P aortic valve replacement with bioprosthetic valve    06-19-2007  severe aortic valve stenosis  . 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 Single vessel coronary artery disease   cardiologist-  dr Irish Lack   a. 05/2007 CABG x 1: s/p SVG-OM;  b. 10/2010 Ex  MV: EF 72%, inf attenuation w/o ischemia, brief run of PAT with exercise. To  . Thrombocytopenia ([place])   . Thyroid goiter 2013   nodular  . Wears hearing aid    BILATERAL  . Wears partial dentures    UPPER    Past Surgical History:  Procedure Laterality Date  . AORTIC VALVE REPLACEMENT (AVR)/CORONARY ARTERY BYPASS GRAFTING (CABG)  06-19-2007  dr [name]   SVG to OM1 ;   AVR w/ #23 Mitralflow pericardial and ligation left atrial appendage;  MV repair w/ #28 Sorin 3-D memo Ring Annuloplasty with repair ruptured chordar of P-2 flail segments  . CARDIAC CATHETERIZATION  05-12-2007  dr Leonia Reeves   single vessel 30% ostial LAD,  80% LCx;  severe to critial AS,  moderate MR,  normal LVF, ef 70%,  normal right heart pressures and cardiac outputs,  mild elevated LV end-diastolic pressure    . CARDIOVASCULAR STRESS TEST  11-02-2010  dr Irish Lack   normal nuclear study w/ no ischemia or infarct/scar/  normal LV function and wall motion , ef 72%  . CARDIOVERSION N/A 04/06/2014   Procedure: CARDIOVERSION;  Surgeon: [name], MD;  Location: [place];  Service: Cardiovascular;  Laterality: N/A;   successful  .  CATARACT EXTRACTION W/ INTRAOCULAR LENS  IMPLANT, BILATERAL  2012  approx  . CORONARY ARTERY BYPASS GRAFT    . RETROPUBIC RADICAL PROSTATECTOMY  06/ 2000  in [place]  . RIGHT/LEFT HEART CATH AND CORONARY/GRAFT ANGIOGRAPHY N/A 10/29/2018   Procedure: RIGHT/LEFT HEART CATH AND CORONARY/GRAFT ANGIOGRAPHY;  Surgeon: [name], MD;  Location: [place] CV LAB;  Service: Cardiovascular;  Laterality: N/A;  . ROTATOR CUFF REPAIR Left 09/1998  . TEE WITHOUT CARDIOVERSION N/A 04/06/2014   Procedure: TRANSESOPHAGEAL ECHOCARDIOGRAM (TEE);  Surgeon: [name], MD;  Location: [facility] ENDOSCOPY;  Service: Cardiovascular;  Laterality: N/A;  mild focal basa LVH of the septum, ef 50-55%, s/p AV annuloplasty ring, elongated chordae, thickened leaflets, mild to mod. MR, multiple small  jets/arotic bioprosthetic valve sits well in position, mild AI/ thickened TV w/ mild reurg./mild LA  . TEE WITHOUT CARDIOVERSION N/A 10/22/2018   Procedure: TRANSESOPHAGEAL ECHOCARDIOGRAM (TEE);  Surgeon: [name], MD;  Location: [facility] ENDOSCOPY;  Service: Cardiovascular;  Laterality: N/A;  . TEE WITHOUT CARDIOVERSION N/A 11/25/2018   Procedure: TRANSESOPHAGEAL ECHOCARDIOGRAM (TEE);  Surgeon: [name], MD;  Location: [place];  Service: Open Heart Surgery;  Laterality: N/A;  . TRANSCATHETER AORTIC VALVE REPLACEMENT, TRANSFEMORAL N/A 11/25/2018   Procedure: TRANSCATHETER AORTIC VALVE REPLACEMENT, TRANSFEMORAL;  Surgeon: [name], MD;  Location: [place];  Service: Open Heart Surgery;  Laterality: N/A;  . TRANSTHORACIC ECHOCARDIOGRAM  03-23-2016   dr Irish Lack   EF 55-60%, reduced contribution atrial contraction to ventricular filling, due to increased ventricular diastolic pressure or atrial contratile dysfunction/  bioprosthetic AV w/ mod. regurg. (valve area 1.97cm^2)/ mild dilated ascending aorta/ mild thicken MV normal function annular ring prosthesis/severe LAE & mod. to sev.RAE/ PASP 46mmHg/ mild TR/ ventricle septal motion show paradox  . TRANSURETHRAL RESECTION OF BLADDER TUMOR N/A 02/13/2016   Procedure: TRANSURETHRAL RESECTION OF BLADDER TUMOR (TURBT);  Surgeon: Nickie Retort, MD;  Location: [facility];  Service: Urology;  Laterality: N/A;  . TRANSURETHRAL RESECTION OF BLADDER TUMOR N/A 09/04/2016   Procedure: TRANSURETHRAL RESECTION OF BLADDER TUMOR (TURBT);  Surgeon: Nickie Retort, MD;  Location: [facility];  Service: Urology;  Laterality: N/A;    There were no vitals filed for this visit.  Subjective Assessment - 07/06/19 0826    Subjective  relays no new complaints, denies pain today    Pertinent History  PMH: CABG 10/29/18, aortic valve replacment (TAVR) 11/2018, bladder and prostate CA,CAD, RTC repair 09/1998 on Lt    Limitations   Walking;House hold activities;Lifting;Standing    How long can you stand comfortably?  20    How long can you walk comfortably?  can walk through supermarket, 2-3 blocks.    Patient Stated Goals  improve balance       [facility] Adult PT Treatment/Exercise - 07/06/19 0001      High Level Balance   High Level Balance Comments  in bars: tandem walk fwd and retro up/down X 3 times, SLS X 5 reps ea side with 5 sec avg, balance on airex pad eyes closed 30 sec X 3, marching on airex X 20 reps all with intemit UE support PRN      Exercises   Other Exercises   nu step for endurance L6X90min UE/LE.  Postural stretching doorway reaching shoulder up to top frame for 2 breaths, then other side for 2 breaths, then both arms for 2 breathes. Leg press at shuttle 112 lbs 3x10reps. Farmers carry 20 lb KB 2 laps  in right hand, then swithced to Lt hand. Step ups without UE support 8 inch step X 15reps bil.  KB deadlift from 8 inch step 10 lbs X 15 reps. Bent over row with 15 lb KB X 20 reps.  Standing rows/ext with green X 20 ea. Sit to stands no UE support X 15 reps        PT Short Term Goals - 07/06/19 0859      PT SHORT TERM GOAL #1   Title  Pt will be I and compliant with initial HEP. 4 weeks 04/15/19    Status  Achieved      PT SHORT TERM GOAL #2   Title  Pt will improve BERG balance score to at least 48. 4 weeks 03/15/19    Status  Achieved        PT Long Term Goals - 04/28/19 0944      PT LONG TERM GOAL #1   Title  Pt will be I and compliant with HEP. (target for all goals 6 weeks 04/27/19)    Status  On-going      PT LONG TERM GOAL #2   Title  Pt will improv BERG balance score >50 to show improved balance    Status  Achieved      PT LONG TERM GOAL #3   Title  Pt will improve FGA to at least 24 to show improved balance.    Status  On-going      PT LONG TERM GOAL #4   Title  Pt will improve gait speed in 20 ft walk test from 6.5 sec to less than 6 sec.    Status  On-going             Plan - 07/06/19 ID:[id number]    Clinical Impression Statement  Able to progress his reps and or resistance from last time with good tolerance. He has overall progressed well with PT and is on track to meet his goals in the next 2 weeks.    Comorbidities  PMH: CABG 10/29/18, aortic valve replacment (TAVR) 11/2018, bladder and prostate CA,CAD, RTC repair 09/1998 on Lt    Examination-Activity Limitations  Carry;Squat;Lift;Stand;Locomotion Level    Examination-Participation Restrictions  Community Activity;Yard Work;Shop    Stability/Clinical Decision Making  Evolving/Moderate complexity    Rehab Potential  Good    PT Frequency  2x / week    PT Duration  6 weeks    PT Treatment/Interventions  ADLs/Self Care Home Management;Aquatic Therapy;Gait training;Stair training;Functional mobility training;Therapeutic activities;Therapeutic exercise;Balance training;Neuromuscular re-education;Manual techniques;Orthotic Fit/Training;Taping    PT Next Visit Plan  needs recert, continue to progress higher level balance challenges, overall  strength and endurance.    PT Home Exercise Plan  Access Code: [email or web address] and Agree with Plan of Care  Patient       Patient will benefit from skilled therapeutic intervention in order to improve the following deficits and impairments:  Abnormal gait, Cardiopulmonary status limiting activity, Decreased activity tolerance, Decreased balance, Decreased endurance, Decreased strength, Difficulty walking  Visit Diagnosis: Other abnormalities of gait and mobility  Difficulty in walking, not elsewhere classified     Problem List Patient Active Problem List   Diagnosis Date Noted  . History of mitral valve repair 11/26/2018  . Prosthetic valve dysfunction 11/25/2018  . Ectatic abdominal aorta ([place]) 11/25/2018  . 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 History of bladder cancer   . History of pulmonary embolus (PE)   . Acute on chronic  diastolic heart failure  ([place]) 04/23/2018  . History of aortic valve replacement 04/23/2018  . Atrial fibrillation ([place]) 04/23/2018  . Coronary atherosclerosis of native coronary artery 02/25/2014  . Hyperlipidemia 02/25/2014  . Embolism and thrombosis ([place]) 03/11/2013    Silvestre Mesi 07/06/2019, 9:01 AM  [facility] Physical Therapy [address] [place], [name], [address] Phone: [phone]   Fax:  [phone]  Name: Eric [name] MRN: [id number] Date of Birth: September 12, 1929

## 2019-07-08 ENCOUNTER — Ambulatory Visit: Payer: Medicare Other | Admitting: Physical Therapy

## 2019-07-08 ENCOUNTER — Other Ambulatory Visit: Payer: Self-pay

## 2019-07-08 DIAGNOSIS — R2689 Other abnormalities of gait and mobility: Secondary | ICD-10-CM | POA: Diagnosis not present

## 2019-07-08 DIAGNOSIS — R262 Difficulty in walking, not elsewhere classified: Secondary | ICD-10-CM

## 2019-07-08 NOTE — Therapy (Signed)
[facility] Physical Therapy [address] [place], [name], [address] Phone: [phone]   Fax:  [phone]  Physical Therapy Treatment  Patient Details  Name: Eric [name] MRN: [id number] Date of Birth: [birth year] Referring Provider (PT): [name], MD (PCP)   Encounter Date: 07/08/2019  PT End of Session - 07/08/19 0844    Visit Number  18    Number of Visits  20   recert and progres note performed 04/28/19 to extend [place] 12 more visits for 6 weeks   Date for PT Re-Evaluation  06/09/19    Authorization Type  [employer] MCR (did progress note visit 9 as his POC was up, next progress note needed at visit 19    PT Start Time  0808    PT Stop Time  0847    PT Time Calculation (min)  39 min    Activity Tolerance  Patient tolerated treatment well    Behavior During Therapy  [facility] for tasks assessed/performed       Past Medical History:  Diagnosis Date  . Atrial flutter, paroxysmal ([place])    a. dx 04-02-2014, s/p  successful cardioversion 04-06-2014.  Marland Kitchen Chronic anticoagulation    on Eliquis--  due to recurrent dvt's and pe's  . Dyslipidemia   . History of bladder cancer urologist-  dr [name]   02-13-2016  s/p TURBT per path high grade papillary urothelial carcinoma  . History of DVT (deep vein thrombosis)    2000-- RLE  . History of melanoma excision    left flank;  07/ 2014 left lower leg and right upper chest  . History of prostate cancer    Gleason 6--  s/p  radical prostatectomy 06/ 2000 in [place]---  no recurrence  . History of pulmonary embolus (PE)    2000 & 2005  bilateral  . Hx of valvuloplasty 06/19/2007   a. s/p mitral ring annuloplasty, repair ruptured chordae of P2 flail segments of MV  . S/P aortic valve replacement with bioprosthetic valve    06-19-2007  severe aortic valve stenosis  . 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 Single vessel coronary artery disease   cardiologist-  dr Irish Lack   a. 05/2007 CABG x 1: s/p SVG-OM;  b. 10/2010 Ex  MV: EF 72%, inf attenuation w/o ischemia, brief run of PAT with exercise. To  . Thrombocytopenia ([place])   . Thyroid goiter 2013   nodular  . Wears hearing aid    BILATERAL  . Wears partial dentures    UPPER    Past Surgical History:  Procedure Laterality Date  . AORTIC VALVE REPLACEMENT (AVR)/CORONARY ARTERY BYPASS GRAFTING (CABG)  06-19-2007  dr [name]   SVG to OM1 ;   AVR w/ #23 Mitralflow pericardial and ligation left atrial appendage;  MV repair w/ #28 Sorin 3-D memo Ring Annuloplasty with repair ruptured chordar of P-2 flail segments  . CARDIAC CATHETERIZATION  05-12-2007  dr Leonia Reeves   single vessel 30% ostial LAD,  80% LCx;  severe to critial AS,  moderate MR,  normal LVF, ef 70%,  normal right heart pressures and cardiac outputs,  mild elevated LV end-diastolic pressure    . CARDIOVASCULAR STRESS TEST  11-02-2010  dr Irish Lack   normal nuclear study w/ no ischemia or infarct/scar/  normal LV function and wall motion , ef 72%  . CARDIOVERSION N/A 04/06/2014   Procedure: CARDIOVERSION;  Surgeon: [name], MD;  Location: [place];  Service: Cardiovascular;  Laterality: N/A;   successful  .  CATARACT EXTRACTION W/ INTRAOCULAR LENS  IMPLANT, BILATERAL  2012  approx  . CORONARY ARTERY BYPASS GRAFT    . RETROPUBIC RADICAL PROSTATECTOMY  06/ 2000  in [place]  . RIGHT/LEFT HEART CATH AND CORONARY/GRAFT ANGIOGRAPHY N/A 10/29/2018   Procedure: RIGHT/LEFT HEART CATH AND CORONARY/GRAFT ANGIOGRAPHY;  Surgeon: [name], MD;  Location: [place] CV LAB;  Service: Cardiovascular;  Laterality: N/A;  . ROTATOR CUFF REPAIR Left 09/1998  . TEE WITHOUT CARDIOVERSION N/A 04/06/2014   Procedure: TRANSESOPHAGEAL ECHOCARDIOGRAM (TEE);  Surgeon: [name], MD;  Location: [facility] ENDOSCOPY;  Service: Cardiovascular;  Laterality: N/A;  mild focal basa LVH of the septum, ef 50-55%, s/p AV annuloplasty ring, elongated chordae, thickened leaflets, mild to mod. MR, multiple small  jets/arotic bioprosthetic valve sits well in position, mild AI/ thickened TV w/ mild reurg./mild LA  . TEE WITHOUT CARDIOVERSION N/A 10/22/2018   Procedure: TRANSESOPHAGEAL ECHOCARDIOGRAM (TEE);  Surgeon: [name], MD;  Location: [facility] ENDOSCOPY;  Service: Cardiovascular;  Laterality: N/A;  . TEE WITHOUT CARDIOVERSION N/A 11/25/2018   Procedure: TRANSESOPHAGEAL ECHOCARDIOGRAM (TEE);  Surgeon: [name], MD;  Location: [place];  Service: Open Heart Surgery;  Laterality: N/A;  . TRANSCATHETER AORTIC VALVE REPLACEMENT, TRANSFEMORAL N/A 11/25/2018   Procedure: TRANSCATHETER AORTIC VALVE REPLACEMENT, TRANSFEMORAL;  Surgeon: [name], MD;  Location: [place];  Service: Open Heart Surgery;  Laterality: N/A;  . TRANSTHORACIC ECHOCARDIOGRAM  03-23-2016   dr Irish Lack   EF 55-60%, reduced contribution atrial contraction to ventricular filling, due to increased ventricular diastolic pressure or atrial contratile dysfunction/  bioprosthetic AV w/ mod. regurg. (valve area 1.97cm^2)/ mild dilated ascending aorta/ mild thicken MV normal function annular ring prosthesis/severe LAE & mod. to sev.RAE/ PASP 78mmHg/ mild TR/ ventricle septal motion show paradox  . TRANSURETHRAL RESECTION OF BLADDER TUMOR N/A 02/13/2016   Procedure: TRANSURETHRAL RESECTION OF BLADDER TUMOR (TURBT);  Surgeon: Nickie Retort, MD;  Location: [facility];  Service: Urology;  Laterality: N/A;  . TRANSURETHRAL RESECTION OF BLADDER TUMOR N/A 09/04/2016   Procedure: TRANSURETHRAL RESECTION OF BLADDER TUMOR (TURBT);  Surgeon: Nickie Retort, MD;  Location: [facility];  Service: Urology;  Laterality: N/A;    There were no vitals filed for this visit.  Subjective Assessment - 07/08/19 0835    Subjective  he requests the nustep to warm up on, says this is his favorite machine, denies pain upon arrrival    Pertinent History  PMH: CABG 10/29/18, aortic valve replacment (TAVR) 11/2018, bladder and prostate  CA,CAD, RTC repair 09/1998 on Lt    Limitations  Walking;House hold activities;Lifting;Standing    How long can you stand comfortably?  20    How long can you walk comfortably?  can walk through supermarket, 2-3 blocks.    Patient Stated Goals  improve balance            [facility] Adult PT Treatment/Exercise - 07/08/19 0001      High Level Balance   High Level Balance Comments  in bars: tandem balance on beam 30 sec X 2 bilat, then sidestepping on balance beam up/down X 3, SLS X 3 each side       Exercises   Other Exercises   Pulleys X 2 min flexion and 2 min scaption for shoulder ROM, nu step for endurance L7X74min UE/LE.  Postural stretching doorway reaching shoulder up to top frame for 2 breaths, then other side for 2 breaths, then both arms for 2 breathes. Leg press at shuttle  112 lbs 3x10reps. Farmers carry 20 lb KB 2 laps in right hand, then swithced to Lt hand. Step ups without UE support 8 inch step X 15reps bil.  KB deadlift from 8 inch step 10 lbs X 15 reps. Bent over row with 15 lb KB X 20 reps.  Standing rows/ext with green X 20 ea. Sit to stands no UE support X 15 reps               PT Short Term Goals - 07/06/19 0859      PT SHORT TERM GOAL #1   Title  Pt will be I and compliant with initial HEP. 4 weeks 04/15/19    Status  Achieved      PT SHORT TERM GOAL #2   Title  Pt will improve BERG balance score to at least 48. 4 weeks 03/15/19    Status  Achieved        PT Long Term Goals - 04/28/19 0944      PT LONG TERM GOAL #1   Title  Pt will be I and compliant with HEP. (target for all goals 6 weeks 04/27/19)    Status  On-going      PT LONG TERM GOAL #2   Title  Pt will improv BERG balance score >50 to show improved balance    Status  Achieved      PT LONG TERM GOAL #3   Title  Pt will improve FGA to at least 24 to show improved balance.    Status  On-going      PT LONG TERM GOAL #4   Title  Pt will improve gait speed in 20 ft walk test from 6.5 sec to  less than 6 sec.    Status  On-going            Plan - 07/08/19 0845    Clinical Impression Statement  He had good tolerance without complaints with strength, endurance, balance training today. He will need MCR progress note/next time    Comorbidities  PMH: CABG 10/29/18, aortic valve replacment (TAVR) 11/2018, bladder and prostate CA,CAD, RTC repair 09/1998 on Lt    Examination-Activity Limitations  Carry;Squat;Lift;Stand;Locomotion Level    Examination-Participation Restrictions  Community Activity;Yard Work;Shop    Stability/Clinical Decision Making  Evolving/Moderate complexity    Rehab Potential  Good    PT Frequency  2x / week    PT Duration  6 weeks    PT Treatment/Interventions  ADLs/Self Care Home Management;Aquatic Therapy;Gait training;Stair training;Functional mobility training;Therapeutic activities;Therapeutic exercise;Balance training;Neuromuscular re-education;Manual techniques;Orthotic Fit/Training;Taping    PT Next Visit Plan  needs recert, continue to progress higher level balance challenges, overall  strength and endurance.    PT Home Exercise Plan  Access Code: [email or web address] and Agree with Plan of Care  Patient       Patient will benefit from skilled therapeutic intervention in order to improve the following deficits and impairments:  Abnormal gait, Cardiopulmonary status limiting activity, Decreased activity tolerance, Decreased balance, Decreased endurance, Decreased strength, Difficulty walking  Visit Diagnosis: Other abnormalities of gait and mobility  Difficulty in walking, not elsewhere classified     Problem List Patient Active Problem List   Diagnosis Date Noted  . History of mitral valve repair 11/26/2018  . Prosthetic valve dysfunction 11/25/2018  . Ectatic abdominal aorta ([place]) 11/25/2018  . 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 History of bladder cancer   . History of pulmonary embolus (PE)   . Acute  on chronic diastolic heart failure  ([place]) 04/23/2018  . History of aortic valve replacement 04/23/2018  . Atrial fibrillation ([place]) 04/23/2018  . Coronary atherosclerosis of native coronary artery 02/25/2014  . Hyperlipidemia 02/25/2014  . Embolism and thrombosis [facility]) 03/11/2013    Silvestre Mesi 07/08/2019, 8:54 AM  [facility] Physical Therapy [address] [place], [name], [address] Phone: [phone]   Fax:  [phone]  Name: SAIYAN ROCCAFORTE MRN: [id number] Date of Birth: January 13, 1930

## 2019-07-13 ENCOUNTER — Other Ambulatory Visit: Payer: Self-pay

## 2019-07-13 ENCOUNTER — Ambulatory Visit (INDEPENDENT_AMBULATORY_CARE_PROVIDER_SITE_OTHER): Payer: Medicare Other | Admitting: Physical Therapy

## 2019-07-13 DIAGNOSIS — R2689 Other abnormalities of gait and mobility: Secondary | ICD-10-CM

## 2019-07-13 DIAGNOSIS — R262 Difficulty in walking, not elsewhere classified: Secondary | ICD-10-CM

## 2019-07-13 NOTE — Therapy (Signed)
[facility] Physical Therapy [address] [place], [name], [address] Phone: [phone]   Fax:  [phone]  Physical Therapy Treatment/Progress note Progress Note reporting period 04/27/19 to 07/13/19  See below for objective and subjective measurements relating to patients progress with PT.   Patient Details  Name: Eric [name] MRN: [id number] Date of Birth: [birth year] Referring Provider (PT): [name], MD (PCP)   Encounter Date: 07/13/2019  PT End of Session - 07/13/19 0927    Visit Number  19    Number of Visits  22   recert and progres note performed 04/28/19 to extend POC 12 more visits for 6 weeks   Date for PT Re-Evaluation  07/27/19    Authorization Type  --    PT Start Time  0800    PT Stop Time  0845    PT Time Calculation (min)  45 min    Activity Tolerance  Patient tolerated treatment well    Behavior During Therapy  [facility] for tasks assessed/performed       Past Medical History:  Diagnosis Date  . Atrial flutter, paroxysmal ([place])    a. dx 04-02-2014, s/p  successful cardioversion 04-06-2014.  Marland Kitchen Chronic anticoagulation    on Eliquis--  due to recurrent dvt's and pe's  . Dyslipidemia   . History of bladder cancer urologist-  dr [name]   02-13-2016  s/p TURBT per path high grade papillary urothelial carcinoma  . History of DVT (deep vein thrombosis)    2000-- RLE  . History of melanoma excision    left flank;  07/ 2014 left lower leg and right upper chest  . History of prostate cancer    Gleason 6--  s/p  radical prostatectomy 06/ 2000 in [place]---  no recurrence  . History of pulmonary embolus (PE)    2000 & 2005  bilateral  . Hx of valvuloplasty 06/19/2007   a. s/p mitral ring annuloplasty, repair ruptured chordae of P2 flail segments of MV  . S/P aortic valve replacement with bioprosthetic valve    06-19-2007  severe aortic valve stenosis  . 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 Single vessel coronary artery disease    cardiologist-  dr Irish Lack   a. 05/2007 CABG x 1: s/p SVG-OM;  b. 10/2010 Ex MV: EF 72%, inf attenuation w/o ischemia, brief run of PAT with exercise. To  . Thrombocytopenia ([place])   . Thyroid goiter 2013   nodular  . Wears hearing aid    BILATERAL  . Wears partial dentures    UPPER    Past Surgical History:  Procedure Laterality Date  . AORTIC VALVE REPLACEMENT (AVR)/CORONARY ARTERY BYPASS GRAFTING (CABG)  06-19-2007  dr [name]   SVG to OM1 ;   AVR w/ #23 Mitralflow pericardial and ligation left atrial appendage;  MV repair w/ #28 Sorin 3-D memo Ring Annuloplasty with repair ruptured chordar of P-2 flail segments  . CARDIAC CATHETERIZATION  05-12-2007  dr Leonia Reeves   single vessel 30% ostial LAD,  80% LCx;  severe to critial AS,  moderate MR,  normal LVF, ef 70%,  normal right heart pressures and cardiac outputs,  mild elevated LV end-diastolic pressure    . CARDIOVASCULAR STRESS TEST  11-02-2010  dr Irish Lack   normal nuclear study w/ no ischemia or infarct/scar/  normal LV function and wall motion , ef 72%  . CARDIOVERSION N/A 04/06/2014   Procedure: CARDIOVERSION;  Surgeon: [name], MD;  Location: [place];  Service: Cardiovascular;  Laterality: N/A;  successful  . CATARACT EXTRACTION W/ INTRAOCULAR LENS  IMPLANT, BILATERAL  2012  approx  . CORONARY ARTERY BYPASS GRAFT    . RETROPUBIC RADICAL PROSTATECTOMY  06/ 2000  in [place]  . RIGHT/LEFT HEART CATH AND CORONARY/GRAFT ANGIOGRAPHY N/A 10/29/2018   Procedure: RIGHT/LEFT HEART CATH AND CORONARY/GRAFT ANGIOGRAPHY;  Surgeon: [name], MD;  Location: [place] CV LAB;  Service: Cardiovascular;  Laterality: N/A;  . ROTATOR CUFF REPAIR Left 09/1998  . TEE WITHOUT CARDIOVERSION N/A 04/06/2014   Procedure: TRANSESOPHAGEAL ECHOCARDIOGRAM (TEE);  Surgeon: [name], MD;  Location: [facility] ENDOSCOPY;  Service: Cardiovascular;  Laterality: N/A;  mild focal basa LVH of the septum, ef 50-55%, s/p AV annuloplasty ring,  elongated chordae, thickened leaflets, mild to mod. MR, multiple small jets/arotic bioprosthetic valve sits well in position, mild AI/ thickened TV w/ mild reurg./mild LA  . TEE WITHOUT CARDIOVERSION N/A 10/22/2018   Procedure: TRANSESOPHAGEAL ECHOCARDIOGRAM (TEE);  Surgeon: [name], MD;  Location: [facility] ENDOSCOPY;  Service: Cardiovascular;  Laterality: N/A;  . TEE WITHOUT CARDIOVERSION N/A 11/25/2018   Procedure: TRANSESOPHAGEAL ECHOCARDIOGRAM (TEE);  Surgeon: [name], MD;  Location: [place];  Service: Open Heart Surgery;  Laterality: N/A;  . TRANSCATHETER AORTIC VALVE REPLACEMENT, TRANSFEMORAL N/A 11/25/2018   Procedure: TRANSCATHETER AORTIC VALVE REPLACEMENT, TRANSFEMORAL;  Surgeon: [name], MD;  Location: [place];  Service: Open Heart Surgery;  Laterality: N/A;  . TRANSTHORACIC ECHOCARDIOGRAM  03-23-2016   dr Irish Lack   EF 55-60%, reduced contribution atrial contraction to ventricular filling, due to increased ventricular diastolic pressure or atrial contratile dysfunction/  bioprosthetic AV w/ mod. regurg. (valve area 1.97cm^2)/ mild dilated ascending aorta/ mild thicken MV normal function annular ring prosthesis/severe LAE & mod. to sev.RAE/ PASP 51mHg/ mild TR/ ventricle septal motion show paradox  . TRANSURETHRAL RESECTION OF BLADDER TUMOR N/A 02/13/2016   Procedure: TRANSURETHRAL RESECTION OF BLADDER TUMOR (TURBT);  Surgeon: [name] MD;  [facility]  Service: Urology;  Laterality: N/A;  . TRANSURETHRAL RESECTION OF BLADDER TUMOR N/A 09/04/2016   Procedure: TRANSURETHRAL RESECTION OF BLADDER TUMOR (TURBT);  Surgeon: [name] MD;  [facility]  Service: Urology;  Laterality: N/A;    There were no vitals filed for this visit.  Subjective Assessment - 07/13/19 0816    Subjective  no complaints upon arrival.    Pertinent History  PMH: CABG 10/29/18, aortic valve replacment (TAVR) 11/2018, bladder and prostate  CA,CAD, RTC repair 09/1998 on Lt    Limitations  Walking;House hold activities;Lifting;Standing    How long can you stand comfortably?  20    How long can you walk comfortably?  can walk through supermarket, 2-3 blocks.    Patient Stated Goals  improve balance    Currently in Pain?  No/denies         [facility] - 07/13/19 0001      Assessment   Medical Diagnosis  Gait/balance    Referring Provider (PT)  GLavone Orn MD (PCP)      Ambulation/Gait   Ambulation/Gait  Yes    Gait velocity  20 ft in 6sec = 3.33 ft/sec      Functional Gait  Assessment   Gait Level Surface  Walks 20 ft in less than 5.5 sec, no assistive devices, good speed, no evidence for imbalance, normal gait pattern, deviates no more than 6 in outside of the 12 in walkway width.    Change in Gait Speed  Able to smoothly  change walking speed without loss of balance or gait deviation. Deviate no more than 6 in outside of the 12 in walkway width.    Gait with Horizontal Head Turns  Performs head turns smoothly with no change in gait. Deviates no more than 6 in outside 12 in walkway width    Gait with Vertical Head Turns  Performs head turns with no change in gait. Deviates no more than 6 in outside 12 in walkway width.    Gait and Pivot Turn  Pivot turns safely within 3 sec and stops quickly with no loss of balance.    Step Over Obstacle  Is able to step over 2 stacked shoe boxes taped together (9 in total height) without changing gait speed. No evidence of imbalance.    Gait with Narrow Base of Support  Ambulates 7-9 steps.    Gait with Eyes Closed  Walks 20 ft, slow speed, abnormal gait pattern, evidence for imbalance, deviates 10-15 in outside 12 in walkway width. Requires more than 9 sec to ambulate 20 ft.    Ambulating Backwards  Walks 20 ft, uses assistive device, slower speed, mild gait deviations, deviates 6-10 in outside 12 in walkway width.    Steps  Alternating feet, no rail.    Total Score  26                    [facility] Adult PT Treatment/Exercise - 07/13/19 0001      Exercises   Other Exercises   Pulleys X 2 min flexion and 2 min scaption for shoulder ROM, nu step for endurance L7X35mn UE/LE.  Postural stretching doorway reaching shoulder up to top frame for 2 breaths, then other side for 2 breaths, then both arms for 2 breathes. Leg press at shuttle 112 lbs 3x12reps. Farmers carry 20 lb KB 2 laps in right hand, then swithced to Lt hand. Sit to stands with 5 lb KB X 15 reps. Bent over row with 15 lb KB X 20 reps.                 PT Short Term Goals - 07/13/19 0929      PT SHORT TERM GOAL #1   Title  Pt will be I and compliant with initial HEP. 4 weeks 04/15/19    Status  Achieved      PT SHORT TERM GOAL #2   Title  Pt will improve BERG balance score to at least 48. 4 weeks 03/15/19    Status  Achieved        PT Long Term Goals - 07/13/19 0929      PT LONG TERM GOAL #1   Title  Pt will be I and compliant with HEP. (target for all goals 6 weeks 04/27/19)    Status  On-going      PT LONG TERM GOAL #2   Title  Pt will improv BERG balance score >50 to show improved balance    Status  Achieved      PT LONG TERM GOAL #3   Title  Pt will improve FGA to at least 24 to show improved balance.    Status  Achieved      PT LONG TERM GOAL #4   Title  Pt will improve gait speed in 20 ft walk test from 6.5 sec to less than 6 sec.    Status  Achieved            Plan - 07/13/19 01601  Clinical Impression Statement  He has now met his balance goals and gait speed goals. He has overall made excellent progress with PT. PT recommends 2 more weeks of PT to allow him to transition and progress to HEP only    PT Next Visit Plan  2 more weeks then DC    PT Home Exercise Plan  Access Code: [email or web address]    Consulted and Agree with Plan of Care  Patient       Patient will benefit from skilled therapeutic intervention in order to improve the following deficits and  impairments:     Visit Diagnosis: Other abnormalities of gait and mobility  Difficulty in walking, not elsewhere classified     Problem List Patient Active Problem List   Diagnosis Date Noted  . History of mitral valve repair 11/26/2018  . Prosthetic valve dysfunction 11/25/2018  . Ectatic abdominal aorta ([place]) 11/25/2018  . 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 History of bladder cancer   . History of pulmonary embolus (PE)   . Acute on chronic diastolic heart failure ([place]) 04/23/2018  . History of aortic valve replacement 04/23/2018  . Atrial fibrillation ([place]) 04/23/2018  . Coronary atherosclerosis of native coronary artery 02/25/2014  . Hyperlipidemia 02/25/2014  . Embolism and thrombosis ([place]) 03/11/2013    Debbe Odea 07/13/2019, 9:34 AM  [facility] Physical Therapy [address] [place], [name], [address] Phone: [phone]   Fax:  [phone]  Name: Eric [name] MRN: [id number] Date of Birth: Jan 15, 1930

## 2019-07-15 ENCOUNTER — Ambulatory Visit (INDEPENDENT_AMBULATORY_CARE_PROVIDER_SITE_OTHER): Payer: Medicare Other | Admitting: Physical Therapy

## 2019-07-15 ENCOUNTER — Other Ambulatory Visit: Payer: Self-pay

## 2019-07-15 DIAGNOSIS — R2689 Other abnormalities of gait and mobility: Secondary | ICD-10-CM

## 2019-07-15 DIAGNOSIS — R262 Difficulty in walking, not elsewhere classified: Secondary | ICD-10-CM | POA: Diagnosis not present

## 2019-07-15 NOTE — Therapy (Signed)
[facility] Physical Therapy [address] [place], [name], [address] Phone: [phone]   Fax:  [phone]  Physical Therapy Treatment  Patient Details  Name: Eric [name] MRN: [id number] Date of Birth: [birth year] Referring Provider (PT): [name], MD (PCP)   Encounter Date: 07/15/2019  PT End of Session - 07/15/19 0930    Visit Number  20   did progress note at 19   Number of Visits  22   recert and progres note performed 04/28/19 to extend POC 12 more visits for 6 weeks   Date for PT Re-Evaluation  07/27/19    PT Start Time  0802    PT Stop Time  0845    PT Time Calculation (min)  43 min    Activity Tolerance  Patient tolerated treatment well    Behavior During Therapy  [facility] for tasks assessed/performed       Past Medical History:  Diagnosis Date  . Atrial flutter, paroxysmal ([place])    a. dx 04-02-2014, s/p  successful cardioversion 04-06-2014.  Marland Kitchen Chronic anticoagulation    on Eliquis--  due to recurrent dvt's and pe's  . Dyslipidemia   . History of bladder cancer urologist-  dr [name]   02-13-2016  s/p TURBT per path high grade papillary urothelial carcinoma  . History of DVT (deep vein thrombosis)    2000-- RLE  . History of melanoma excision    left flank;  07/ 2014 left lower leg and right upper chest  . History of prostate cancer    Gleason 6--  s/p  radical prostatectomy 06/ 2000 in [place]---  no recurrence  . History of pulmonary embolus (PE)    2000 & 2005  bilateral  . Hx of valvuloplasty 06/19/2007   a. s/p mitral ring annuloplasty, repair ruptured chordae of P2 flail segments of MV  . S/P aortic valve replacement with bioprosthetic valve    06-19-2007  severe aortic valve stenosis  . 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 Single vessel coronary artery disease   cardiologist-  dr Irish Lack   a. 05/2007 CABG x 1: s/p SVG-OM;  b. 10/2010 Ex MV: EF 72%, inf attenuation w/o ischemia, brief run of PAT with exercise. To  .  Thrombocytopenia ([place])   . Thyroid goiter 2013   nodular  . Wears hearing aid    BILATERAL  . Wears partial dentures    UPPER    Past Surgical History:  Procedure Laterality Date  . AORTIC VALVE REPLACEMENT (AVR)/CORONARY ARTERY BYPASS GRAFTING (CABG)  06-19-2007  dr [name]   SVG to OM1 ;   AVR w/ #23 Mitralflow pericardial and ligation left atrial appendage;  MV repair w/ #28 Sorin 3-D memo Ring Annuloplasty with repair ruptured chordar of P-2 flail segments  . CARDIAC CATHETERIZATION  05-12-2007  dr Leonia Reeves   single vessel 30% ostial LAD,  80% LCx;  severe to critial AS,  moderate MR,  normal LVF, ef 70%,  normal right heart pressures and cardiac outputs,  mild elevated LV end-diastolic pressure    . CARDIOVASCULAR STRESS TEST  11-02-2010  dr Irish Lack   normal nuclear study w/ no ischemia or infarct/scar/  normal LV function and wall motion , ef 72%  . CARDIOVERSION N/A 04/06/2014   Procedure: CARDIOVERSION;  Surgeon: [name], MD;  Location: [place];  Service: Cardiovascular;  Laterality: N/A;   successful  . CATARACT EXTRACTION W/ INTRAOCULAR LENS  IMPLANT, BILATERAL  2012  approx  . CORONARY ARTERY BYPASS GRAFT    .  RETROPUBIC RADICAL PROSTATECTOMY  06/ 2000  in [place]  . RIGHT/LEFT HEART CATH AND CORONARY/GRAFT ANGIOGRAPHY N/A 10/29/2018   Procedure: RIGHT/LEFT HEART CATH AND CORONARY/GRAFT ANGIOGRAPHY;  Surgeon: [name], MD;  Location: [place] CV LAB;  Service: Cardiovascular;  Laterality: N/A;  . ROTATOR CUFF REPAIR Left 09/1998  . TEE WITHOUT CARDIOVERSION N/A 04/06/2014   Procedure: TRANSESOPHAGEAL ECHOCARDIOGRAM (TEE);  Surgeon: [name], MD;  Location: [name] ENDOSCOPY;  Service: Cardiovascular;  Laterality: N/A;  mild focal basa LVH of the septum, ef 50-55%, s/p AV annuloplasty ring, elongated chordae, thickened leaflets, mild to mod. MR, multiple small jets/arotic bioprosthetic valve sits well in position, mild AI/ thickened TV w/ mild  reurg./mild LA  . TEE WITHOUT CARDIOVERSION N/A 10/22/2018   Procedure: TRANSESOPHAGEAL ECHOCARDIOGRAM (TEE);  Surgeon: [name], MD;  Location: [facility] ENDOSCOPY;  Service: Cardiovascular;  Laterality: N/A;  . TEE WITHOUT CARDIOVERSION N/A 11/25/2018   Procedure: TRANSESOPHAGEAL ECHOCARDIOGRAM (TEE);  Surgeon: [name], MD;  Location: [place];  Service: Open Heart Surgery;  Laterality: N/A;  . TRANSCATHETER AORTIC VALVE REPLACEMENT, TRANSFEMORAL N/A 11/25/2018   Procedure: TRANSCATHETER AORTIC VALVE REPLACEMENT, TRANSFEMORAL;  Surgeon: [name], MD;  Location: [place];  Service: Open Heart Surgery;  Laterality: N/A;  . TRANSTHORACIC ECHOCARDIOGRAM  03-23-2016   dr Irish Lack   EF 55-60%, reduced contribution atrial contraction to ventricular filling, due to increased ventricular diastolic pressure or atrial contratile dysfunction/  bioprosthetic AV w/ mod. regurg. (valve area 1.97cm^2)/ mild dilated ascending aorta/ mild thicken MV normal function annular ring prosthesis/severe LAE & mod. to sev.RAE/ PASP 62mmHg/ mild TR/ ventricle septal motion show paradox  . TRANSURETHRAL RESECTION OF BLADDER TUMOR N/A 02/13/2016   Procedure: TRANSURETHRAL RESECTION OF BLADDER TUMOR (TURBT);  Surgeon: Nickie Retort, MD;  Location: [facility];  Service: Urology;  Laterality: N/A;  . TRANSURETHRAL RESECTION OF BLADDER TUMOR N/A 09/04/2016   Procedure: TRANSURETHRAL RESECTION OF BLADDER TUMOR (TURBT);  Surgeon: Nickie Retort, MD;  Location: [facility];  Service: Urology;  Laterality: N/A;    There were no vitals filed for this visit.  Subjective Assessment - 07/15/19 0809    Subjective  no complaints, feels ready to finish up with PT next week    Pertinent History  PMH: CABG 10/29/18, aortic valve replacment (TAVR) 11/2018, bladder and prostate CA,CAD, RTC repair 09/1998 on Lt    Limitations  Walking;House hold activities;Lifting;Standing    How long can you stand  comfortably?  20    How long can you walk comfortably?  can walk through supermarket, 2-3 blocks.    Patient Stated Goals  improve balance    Currently in Pain?  No/denies                       [facility] Adult PT Treatment/Exercise - 07/15/19 0001      High Level Balance   High Level Balance Comments  at counter no UE support heel raises  X20 reps then standing on balance beam and performing fwd reaches to shift weight anteriorly      Exercises   Other Exercises   Pulleys X 2 min flexion and 2 min scaption for shoulder ROM, nu step for endurance L7X67min UE/LE.  Postural stretching doorway reaching shoulder up to top frame for 2 breaths, then other side for 2 breaths, then both arms for 2 breathes. Leg press at shuttle 112 lbs 3x12reps. Farmers carry 20 lb KB 2 laps in right  hand, then swithced to Lt hand. Sit to stands with 5 lb KB X 15 reps. Bent over row with 15 lb KB X 20 reps.  Sit to stands no UE support  x15 reps. Deadlift 12 lbs from 8 inch box step  X10 reps               PT Short Term Goals - 07/13/19 0929      PT SHORT TERM GOAL #1   Title  Pt will be I and compliant with initial HEP. 4 weeks 04/15/19    Status  Achieved      PT SHORT TERM GOAL #2   Title  Pt will improve BERG balance score to at least 48. 4 weeks 03/15/19    Status  Achieved        PT Long Term Goals - 07/13/19 0929      PT LONG TERM GOAL #1   Title  Pt will be I and compliant with HEP. (target for all goals 6 weeks 04/27/19)    Status  On-going      PT LONG TERM GOAL #2   Title  Pt will improv BERG balance score >50 to show improved balance    Status  Achieved      PT LONG TERM GOAL #3   Title  Pt will improve FGA to at least 24 to show improved balance.    Status  Achieved      PT LONG TERM GOAL #4   Title  Pt will improve gait speed in 20 ft walk test from 6.5 sec to less than 6 sec.    Status  Achieved            Plan - 07/15/19 0930    Clinical Impression  Statement  He did well with functional strength program with good tolerance however he was a little off balance posteriorly today with sit to stands to focused on balance activities to facilitate anterior weight shifting.    PT Next Visit Plan  2 more weeks then DC    PT Home Exercise Plan  Access Code: [email or web address]    Consulted and Agree with Plan of Care  Patient       Patient will benefit from skilled therapeutic intervention in order to improve the following deficits and impairments:     Visit Diagnosis: Other abnormalities of gait and mobility  Difficulty in walking, not elsewhere classified     Problem List Patient Active Problem List   Diagnosis Date Noted  . History of mitral valve repair 11/26/2018  . Prosthetic valve dysfunction 11/25/2018  . Ectatic abdominal aorta ([place]) 11/25/2018  . 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 History of bladder cancer   . History of pulmonary embolus (PE)   . Acute on chronic diastolic heart failure ([place]) 04/23/2018  . History of aortic valve replacement 04/23/2018  . Atrial fibrillation ([place]) 04/23/2018  . Coronary atherosclerosis of native coronary artery 02/25/2014  . Hyperlipidemia 02/25/2014  . Embolism and thrombosis ([place]) 03/11/2013    Debbe Odea, PT,DPT 07/15/2019, 9:32 AM  [facility] Physical Therapy [address] [place], [name], [address] Phone: [phone]   Fax:  [phone]  Name: Eric [name] MRN: [id number] Date of Birth: [birth year]

## 2019-07-20 ENCOUNTER — Ambulatory Visit (INDEPENDENT_AMBULATORY_CARE_PROVIDER_SITE_OTHER): Payer: Medicare Other | Admitting: Physical Therapy

## 2019-07-20 ENCOUNTER — Other Ambulatory Visit: Payer: Self-pay

## 2019-07-20 DIAGNOSIS — R262 Difficulty in walking, not elsewhere classified: Secondary | ICD-10-CM

## 2019-07-20 DIAGNOSIS — R2689 Other abnormalities of gait and mobility: Secondary | ICD-10-CM | POA: Diagnosis not present

## 2019-07-20 NOTE — Therapy (Signed)
[facility] Physical Therapy [address] [place], [name], [address] Phone: [phone]   Fax:  [phone]  Physical Therapy Treatment/Discharge PHYSICAL THERAPY DISCHARGE SUMMARY  Visits from Start of Care: 21`  Current functional level related to goals / functional outcomes: See below   Remaining deficits: See below   Education / Equipment: HEP  Plan: Patient agrees to discharge.  Patient goals were partially met. Patient is being discharged due to meeting the stated rehab goals.  ?????       Patient Details  Name: Eric [name] MRN: [id number] Date of Birth: [birth year] Referring Provider (PT): [name], MD (PCP)   Encounter Date: 07/20/2019  PT End of Session - 07/20/19 0825    Visit Number  21    Number of Visits  22   recert and progres note performed 04/28/19 to extend POC 12 more visits for 6 weeks   Date for PT Re-Evaluation  07/27/19    PT Start Time  0805    PT Stop Time  0845    PT Time Calculation (min)  40 min    Activity Tolerance  Patient tolerated treatment well    Behavior During Therapy  [facility] for tasks assessed/performed       Past Medical History:  Diagnosis Date  . Atrial flutter, paroxysmal ([place])    a. dx 04-02-2014, s/p  successful cardioversion 04-06-2014.  Marland Kitchen Chronic anticoagulation    on Eliquis--  due to recurrent dvt's and pe's  . Dyslipidemia   . History of bladder cancer urologist-  dr [name]   02-13-2016  s/p TURBT per path high grade papillary urothelial carcinoma  . History of DVT (deep vein thrombosis)    2000-- RLE  . History of melanoma excision    left flank;  07/ 2014 left lower leg and right upper chest  . History of prostate cancer    Gleason 6--  s/p  radical prostatectomy 06/ 2000 in [place]---  no recurrence  . History of pulmonary embolus (PE)    2000 & 2005  bilateral  . Hx of valvuloplasty 06/19/2007   a. s/p mitral ring annuloplasty, repair ruptured chordae of P2 flail segments of  MV  . S/P aortic valve replacement with bioprosthetic valve    06-19-2007  severe aortic valve stenosis  . 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 Single vessel coronary artery disease   cardiologist-  dr Irish Lack   a. 05/2007 CABG x 1: s/p SVG-OM;  b. 10/2010 Ex MV: EF 72%, inf attenuation w/o ischemia, brief run of PAT with exercise. To  . Thrombocytopenia ([place])   . Thyroid goiter 2013   nodular  . Wears hearing aid    BILATERAL  . Wears partial dentures    UPPER    Past Surgical History:  Procedure Laterality Date  . AORTIC VALVE REPLACEMENT (AVR)/CORONARY ARTERY BYPASS GRAFTING (CABG)  06-19-2007  dr [name]   SVG to OM1 ;   AVR w/ #23 Mitralflow pericardial and ligation left atrial appendage;  MV repair w/ #28 Sorin 3-D memo Ring Annuloplasty with repair ruptured chordar of P-2 flail segments  . CARDIAC CATHETERIZATION  05-12-2007  dr Leonia Reeves   single vessel 30% ostial LAD,  80% LCx;  severe to critial AS,  moderate MR,  normal LVF, ef 70%,  normal right heart pressures and cardiac outputs,  mild elevated LV end-diastolic pressure    . CARDIOVASCULAR STRESS TEST  11-02-2010  dr Irish Lack   normal nuclear study w/ no ischemia or infarct/scar/  normal LV function and wall motion , ef 72%  . CARDIOVERSION N/A 04/06/2014   Procedure: CARDIOVERSION;  Surgeon: [name], MD;  Location: [place];  Service: Cardiovascular;  Laterality: N/A;   successful  . CATARACT EXTRACTION W/ INTRAOCULAR LENS  IMPLANT, BILATERAL  2012  approx  . CORONARY ARTERY BYPASS GRAFT    . RETROPUBIC RADICAL PROSTATECTOMY  06/ 2000  in [place], [place]  . RIGHT/LEFT HEART CATH AND CORONARY/GRAFT ANGIOGRAPHY N/A 10/29/2018   Procedure: RIGHT/LEFT HEART CATH AND CORONARY/GRAFT ANGIOGRAPHY;  Surgeon: [name], MD;  Location: [place] CV LAB;  Service: Cardiovascular;  Laterality: N/A;  . ROTATOR CUFF REPAIR Left 09/1998  . TEE WITHOUT CARDIOVERSION N/A 04/06/2014   Procedure: TRANSESOPHAGEAL ECHOCARDIOGRAM  (TEE);  Surgeon: [name], MD;  Location: [facility] ENDOSCOPY;  Service: Cardiovascular;  Laterality: N/A;  mild focal basa LVH of the septum, ef 50-55%, s/p AV annuloplasty ring, elongated chordae, thickened leaflets, mild to mod. MR, multiple small jets/arotic bioprosthetic valve sits well in position, mild AI/ thickened TV w/ mild reurg./mild LA  . TEE WITHOUT CARDIOVERSION N/A 10/22/2018   Procedure: TRANSESOPHAGEAL ECHOCARDIOGRAM (TEE);  Surgeon: [name], MD;  Location: [facility] ENDOSCOPY;  Service: Cardiovascular;  Laterality: N/A;  . TEE WITHOUT CARDIOVERSION N/A 11/25/2018   Procedure: TRANSESOPHAGEAL ECHOCARDIOGRAM (TEE);  Surgeon: [name], MD;  Location: [place];  Service: Open Heart Surgery;  Laterality: N/A;  . TRANSCATHETER AORTIC VALVE REPLACEMENT, TRANSFEMORAL N/A 11/25/2018   Procedure: TRANSCATHETER AORTIC VALVE REPLACEMENT, TRANSFEMORAL;  Surgeon: [name], MD;  Location: [place];  Service: Open Heart Surgery;  Laterality: N/A;  . TRANSTHORACIC ECHOCARDIOGRAM  03-23-2016   dr Irish Lack   EF 55-60%, reduced contribution atrial contraction to ventricular filling, due to increased ventricular diastolic pressure or atrial contratile dysfunction/  bioprosthetic AV w/ mod. regurg. (valve area 1.97cm^2)/ mild dilated ascending aorta/ mild thicken MV normal function annular ring prosthesis/severe LAE & mod. to sev.RAE/ PASP 31mHg/ mild TR/ ventricle septal motion show paradox  . TRANSURETHRAL RESECTION OF BLADDER TUMOR N/A 02/13/2016   Procedure: TRANSURETHRAL RESECTION OF BLADDER TUMOR (TURBT);  Surgeon: [name] MD;  [facility]  Service: Urology;  Laterality: N/A;  . TRANSURETHRAL RESECTION OF BLADDER TUMOR N/A 09/04/2016   Procedure: TRANSURETHRAL RESECTION OF BLADDER TUMOR (TURBT);  Surgeon: [name] MD;  [facility]  Service: Urology;  Laterality: N/A;    There were no vitals filed for this  visit.  Subjective Assessment - 07/20/19 0825    Subjective  He relays he feels ready for discharge, no pain or complaints or concerns    Pertinent History  PMH: CABG 10/29/18, aortic valve replacment (TAVR) 11/2018, bladder and prostate CA,CAD, RTC repair 09/1998 on Lt    Limitations  Walking;House hold activities;Lifting;Standing    How long can you stand comfortably?  20    How long can you walk comfortably?  can walk through supermarket, 2-3 blocks.    Patient Stated Goals  improve balance         [facility] PT Assessment - 07/20/19 0001      Assessment   Medical Diagnosis  Gait/balance    Referring Provider (PT)  GLavone Orn MD (PCP)      Strength   Overall Strength Comments  LE strength 5/5 MMT tested in sitting      Transfers   Five time sit to stand comments   11 seconds      Berg Balance Test  Sit to Stand  Able to stand without using hands and stabilize independently    Standing Unsupported  Able to stand safely 2 minutes    Sitting with Back Unsupported but Feet Supported on Floor or Stool  Able to sit safely and securely 2 minutes    Stand to Sit  Sits safely with minimal use of hands    Transfers  Able to transfer safely, minor use of hands    Standing Unsupported with Eyes Closed  Able to stand 10 seconds safely    Standing Unsupported with Feet Together  Able to place feet together independently and stand 1 minute safely    From Standing, Reach Forward with Outstretched Arm  Can reach confidently >25 cm (10")    From Standing Position, Pick up Object from Floor  Able to pick up shoe safely and easily    From Standing Position, Turn to Look Behind Over each Shoulder  Looks behind from both sides and weight shifts well    Turn 360 Degrees  Able to turn 360 degrees safely in 4 seconds or less    Standing Unsupported, Alternately Place Feet on Step/Stool  Able to stand independently and safely and complete 8 steps in 20 seconds    Standing Unsupported, One Foot in Front   Able to plae foot ahead of the other independently and hold 30 seconds    Standing on One Leg  Able to lift leg independently and hold equal to or more than 3 seconds    Total Score  53      Timed Up and Go Test   Normal TUG (seconds)  8.5      Functional Gait  Assessment   Gait Level Surface  Walks 20 ft in less than 5.5 sec, no assistive devices, good speed, no evidence for imbalance, normal gait pattern, deviates no more than 6 in outside of the 12 in walkway width.    Change in Gait Speed  Able to smoothly change walking speed without loss of balance or gait deviation. Deviate no more than 6 in outside of the 12 in walkway width.    Gait with Horizontal Head Turns  Performs head turns smoothly with no change in gait. Deviates no more than 6 in outside 12 in walkway width    Gait with Vertical Head Turns  Performs head turns with no change in gait. Deviates no more than 6 in outside 12 in walkway width.    Gait and Pivot Turn  Pivot turns safely within 3 sec and stops quickly with no loss of balance.    Step Over Obstacle  Is able to step over 2 stacked shoe boxes taped together (9 in total height) without changing gait speed. No evidence of imbalance.    Gait with Narrow Base of Support  Ambulates 7-9 steps.    Gait with Eyes Closed  Walks 20 ft, slow speed, abnormal gait pattern, evidence for imbalance, deviates 10-15 in outside 12 in walkway width. Requires more than 9 sec to ambulate 20 ft.    Ambulating Backwards  Walks 20 ft, uses assistive device, slower speed, mild gait deviations, deviates 6-10 in outside 12 in walkway width.    Steps  Alternating feet, no rail.    Total Score  26                   [facility] Adult PT Treatment/Exercise - 07/20/19 0001      Exercises   Other Exercises  Pulleys X 2 min flexion and 2 min scaption for shoulder ROM, nu step for endurance L7X69mn UE/LE.  Postural stretching doorway reaching shoulder up to top frame for 2 breaths, then other  side for 2 breaths, then both arms for 2 breathes. Leg press at shuttle 112 lbs 3x12reps. Farmers carry 20 lb KB 2 laps in right hand, then swithced to Lt hand. Bent over row with 15 lb KB X 20 reps.  Deadlift 12 lbs from 8 inch box step  X10 reps               PT Short Term Goals - 07/20/19 1033      PT SHORT TERM GOAL #1   Title  Pt will be I and compliant with initial HEP. 4 weeks 04/15/19    Status  Achieved      PT SHORT TERM GOAL #2   Title  Pt will improve BERG balance score to at least 48. 4 weeks 03/15/19    Status  Achieved        PT Long Term Goals - 07/20/19 1034      PT LONG TERM GOAL #1   Title  Pt will be I and compliant with HEP. (target for all goals 6 weeks 04/27/19)    Status  Achieved      PT LONG TERM GOAL #2   Title  Pt will improv BERG balance score >50 to show improved balance    Status  Achieved      PT LONG TERM GOAL #3   Title  Pt will improve FGA to at least 24 to show improved balance.    Status  Achieved      PT LONG TERM GOAL #4   Title  Pt will improve gait speed in 20 ft walk test from 6.5 sec to less than 6 sec.    Status  Achieved            Plan - 07/20/19 0826    Clinical Impression Statement  He has met all of his PT functional goals. He has no questions or concerns about discharge and shows good understanding of HEP.    PT Next Visit Plan  --    PT Home Exercise Plan  Access Code: [email or web address]   Consulted and Agree with Plan of Care  Patient       Patient will benefit from skilled therapeutic intervention in order to improve the following deficits and impairments:     Visit Diagnosis: Other abnormalities of gait and mobility  Difficulty in walking, not elsewhere classified     Problem List Patient Active Problem List   Diagnosis Date Noted  . History of mitral valve repair 11/26/2018  . Prosthetic valve dysfunction 11/25/2018  . Ectatic abdominal aorta ([place] 11/25/2018  . S/P valve-in-valve TAVR 11/25/2018   . History of bladder cancer   . History of pulmonary embolus (PE)   . Acute on chronic diastolic heart failure ([place] 04/23/2018  . History of aortic valve replacement 04/23/2018  . Atrial fibrillation ([place] 04/23/2018  . Coronary atherosclerosis of native coronary artery 02/25/2014  . Hyperlipidemia 02/25/2014  . Embolism and thrombosis ([place] 03/11/2013    BSilvestre Mesi3/05/2019, 10:36 AM  [facility] [address] [address]Phone: [phone]  Fax:  [phone] Name: Eric ZIMBELMANMRN: [id number] of Birth: 6October 12, 1931

## 2019-07-22 ENCOUNTER — Encounter: Payer: Medicare Other | Admitting: Physical Therapy

## 2019-09-17 ENCOUNTER — Other Ambulatory Visit: Payer: Self-pay | Admitting: Interventional Cardiology

## 2019-09-18 NOTE — Telephone Encounter (Signed)
Prescription refill request for Eliquis received.  Last office visit: 04/27/2019, Irish Lack Scr: 1.07, 04/27/2019 Age: 86 y.o. Weight: 84.8 kg   Prescription refill sent.

## 2019-11-24 NOTE — Progress Notes (Addendum)
HEART AND Eric [name]                                       Cardiology Office Note    Date:  11/26/2019   ID:  Eric [name], DOB 06-04-1929, MRN [id number]  PCP:  Eric Orn, MD  Cardiologist: Eric Grooms, MD/ Dr. Burt [name] & Dr. [name] (TAVR)  CC: 1 year s/p TAVR   History of Present Illness:  Eric [name] is a 86 y.o. 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   Patient has a history of surgical AVR, mitral valve repair and CABG x1 in 2009. TEE on 10/22/18 showed EF 50-55%, normally functioning mitral valve repair, mod-severe TR, and severe AI. AoV mean gradient 627m Hg, peak gradient 27 mm Hg. Dr. VIrish Lackreferred him to Dr. CBurt Knackfor structural heart consult and consideration of TAVR.L/RHC on 6/10/20showed single-vessel coronary disease with a severe stenosis of the proximal left circumflex and continued patency of the saphenous vein graft to the first OM. His coronaries were patent otherwise.  He was evaluated by the multidisciplinary valve team and underwent successfulvalve in vBonanzawith a211mEdwards Sapien 3 THV via the TF approach on 11/25/18.Post operative echo showed EF 50-55%, severe MAC with mild MS, mild-mod TR and normally functioning TAVR with mean gradient 10.27m29m and no PVL. His BP was noted to be elevated post operatively and he was started on Losartan 227m83mily. He was discharged on home Eliquis and aspirin. 1 month echo showed echo today showed EF 50%, normally functioning TAVR with mean gradient of 14 mm Hg and mild PVL.   Today he presents to clinic for follow up. No CP and slight SOB. He has  much less shortness of breath than before his TAVR but some worsening fatigue and dyspnea over the past several months. No LE edema, orthopnea or PND. No dizziness or syncope. No blood in stool or urine. No palpitations. He does get an ache in his chest sometimes when laying in bed in certain positions.    Past Medical History:  Diagnosis Date  . Atrial flutter, paroxysmal (HCC)[place] a. dx 04-02-2014, s/p  successful cardioversion 04-06-2014.  . ChMarland Kitchenonic anticoagulation    on Eliquis--  due to recurrent dvt's and pe's  . Dyslipidemia   . 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 History of DVT (deep vein thrombosis)    2000-- RLE  . History of melanoma excision    left flank;  07/ 2014 left lower leg and right upper chest  . History of prostate cancer    Gleason 6--  s/p  radical prostatectomy 06/ 2000 in [place]---  no recurrence  . History of pulmonary embolus (PE)    2000 & 2005  bilateral  . Hx of valvuloplasty 06/19/2007   a. s/p mitral ring annuloplasty, repair ruptured chordae of P2 flail segments of MV  . S/P aortic valve replacement with bioprosthetic valve    06-19-2007  severe aortic valve stenosis  . S/P valve-in-valve TAVR  11/25/2018  . Single vessel coronary artery disease   cardiologist-  dr Irish Lack   a. 05/2007 CABG x 1: s/p SVG-OM;  b. 10/2010 Ex MV: EF 72%, inf attenuation w/o ischemia, brief run of PAT with exercise. To  . Thrombocytopenia ([place])   . Thyroid goiter 2013   nodular  . Wears hearing aid    BILATERAL  . Wears partial dentures    UPPER    Past Surgical History:  Procedure Laterality Date  . AORTIC VALVE REPLACEMENT (AVR)/CORONARY ARTERY BYPASS GRAFTING (CABG)  06-19-2007  dr [name]   SVG to OM1 ;   AVR w/ #23 Mitralflow pericardial and ligation left atrial appendage;  MV repair w/ #28 Sorin 3-D memo Ring Annuloplasty with repair ruptured chordar of P-2 flail segments    . CARDIAC CATHETERIZATION  05-12-2007  dr Leonia Reeves   single vessel 30% ostial LAD,  80% LCx;  severe to critial AS,  moderate MR,  normal LVF, ef 70%,  normal right heart pressures and cardiac outputs,  mild elevated LV end-diastolic pressure    . CARDIOVASCULAR STRESS TEST  11-02-2010  dr Irish Lack   normal nuclear study w/ no ischemia or infarct/scar/  normal LV function and wall motion , ef 72%  . CARDIOVERSION N/A 04/06/2014   Procedure: CARDIOVERSION;  Surgeon: [name], MD;  Location: [facility];  Service: Cardiovascular;  Laterality: N/A;   successful  . CATARACT EXTRACTION W/ INTRAOCULAR LENS  IMPLANT, BILATERAL  2012  approx  . CORONARY ARTERY BYPASS GRAFT    . RETROPUBIC RADICAL PROSTATECTOMY  06/ 2000  in [place], [place]  . RIGHT/LEFT HEART CATH AND CORONARY/GRAFT ANGIOGRAPHY N/A 10/29/2018   Procedure: RIGHT/LEFT HEART CATH AND CORONARY/GRAFT ANGIOGRAPHY;  Surgeon: [name], MD;  Location: [facility];  Service: Cardiovascular;  Laterality: N/A;  . ROTATOR CUFF REPAIR Left 09/1998  . TEE WITHOUT CARDIOVERSION N/A 04/06/2014   Procedure: TRANSESOPHAGEAL ECHOCARDIOGRAM (TEE);  Surgeon: [name], MD;  [facility] ENDOSCOPY;  Service: Cardiovascular;  Laterality: N/A;  mild focal basa LVH of the septum, ef 50-55%, s/p AV annuloplasty ring, elongated chordae, thickened leaflets, mild to mod. MR, multiple small jets/arotic bioprosthetic valve sits well in position, mild AI/ thickened TV w/ mild reurg./mild LA  . TEE WITHOUT CARDIOVERSION N/A 10/22/2018   Procedure: TRANSESOPHAGEAL ECHOCARDIOGRAM (TEE);  Surgeon: [name], MD;  Location[facility] ENDOSCOPY;  Service: Cardiovascular;  Laterality: N/A;  . TEE WITHOUT CARDIOVERSION N/A 11/25/2018   Procedure: TRANSESOPHAGEAL ECHOCARDIOGRAM (TEE);  Surgeon: [name], MD;  Location: [facility];  Service: Open Heart Surgery;  Laterality: N/A;  . TRANSCATHETER AORTIC VALVE REPLACEMENT, TRANSFEMORAL N/A 11/25/2018    Procedure: TRANSCATHETER AORTIC VALVE REPLACEMENT, TRANSFEMORAL;  Surgeon: [name], MD;  Location: [facility];  Service: Open Heart Surgery;  Laterality: N/A;  . TRANSTHORACIC ECHOCARDIOGRAM  03-23-2016   dr Irish Lack   EF 55-60%, reduced contribution atrial contraction to ventricular filling, due to increased ventricular diastolic pressure or atrial contratile dysfunction/  bioprosthetic AV w/ mod. regurg. (valve area 1.97cm^2)/ mild dilated ascending aorta/ mild thicken MV normal function annular ring prosthesis/severe LAE & mod. to sev.RAE/ PASP 34mHg/ mild TR/ ventricle septal motion show paradox  . TRANSURETHRAL RESECTION OF BLADDER TUMOR N/A 02/13/2016   Procedure: TRANSURETHRAL RESECTION OF BLADDER TUMOR (TURBT);  Surgeon: [name] MD;  [facility]  Service: Urology;  Laterality: N/A;  . TRANSURETHRAL RESECTION OF BLADDER TUMOR N/A 09/04/2016   Procedure: TRANSURETHRAL RESECTION OF BLADDER TUMOR (TURBT);  Surgeon: Nickie Retort, MD;  Location: [facility];  Service: Urology;  Laterality: N/A;    Current Medications: Outpatient Medications Prior to Visit  Medication Sig Dispense Refill  . amoxicillin (AMOXIL) 500 MG tablet Take 4 tablets (2,000 mg total) by mouth as directed. Take 1 hour prior to dental work, including cleanings. 4 tablet 6  . atorvastatin (LIPITOR) 40 MG tablet Take 1 tablet (40 mg total) by mouth every evening. 90 tablet 3  . ELIQUIS 5 MG TABS tablet TAKE 1 TABLET BY MOUTH  TWICE DAILY 180 tablet 1  . ezetimibe (ZETIA) 10 MG tablet Take 1 tablet (10 mg total) by mouth every evening. 90 tablet 3  . furosemide (LASIX) 20 MG tablet Take 1 tablet (20 mg total) by mouth every other day. Pt may take additional doses if needed for leg swelling 90 tablet 3  . losartan (COZAAR) 25 MG tablet Take 1 tablet (25 mg total) by mouth daily. 90 tablet 3  . metoprolol tartrate (LOPRESSOR) 25 MG tablet Take 0.5 tablets (12.5 mg total)  by mouth 2 (two) times daily. 90 tablet 3  . aspirin 81 MG chewable tablet Chew 1 tablet (81 mg total) by mouth daily. (Patient not taking: Reported on 11/25/2019) 90 tablet 1   No facility-administered medications prior to visit.     Allergies:   Patient has no known allergies.   Social History   Socioeconomic History  . Marital status: Married    Spouse name: Not on file  . Number of children: Not on file  . Years of education: Not on file  . Highest education level: Not on file  Occupational History  . Occupation: Retired  Tobacco Use  . Smoking status: Never Smoker  . Smokeless tobacco: Never Used  Substance and Sexual Activity  . Alcohol use: Yes    Alcohol/week: 1.0 - 2.0 standard drink    Types: 1 - 2 Standard drinks or equivalent per week    Comment: OCC  . Drug use: No  . Sexual activity: Not on file  Other Topics Concern  . Not on file  Social History Narrative   Lives in [place] with wife.  Active.   Social Determinants of Health   Financial Resource Strain: Low Risk   . Difficulty of Paying Living Expenses: Not hard at all  Food Insecurity: No Food Insecurity  . Worried About Charity fundraiser in the Last Year: Never true  . Ran Out of Food in the Last Year: Never true  Transportation Needs: No Transportation Needs  . Lack of Transportation (Medical): No  . Lack of Transportation (Non-Medical): No  Physical Activity: Inactive  . Days of Exercise per Week: 0 days  . Minutes of Exercise per Session: 0 min  Stress: No Stress Concern Present  . Feeling of Stress : Only a little  Social Connections:   . Frequency of Communication with Friends and Family:   . Frequency of Social Gatherings with Friends and Family:   . Attends Religious Services:   . Active Member of Clubs or Organizations:   . Attends Archivist Meetings:   Marland Kitchen Marital Status:      Family History:  The patient's family history includes Leukemia in his mother; Supraventricular  tachycardia in his sister.      ROS:   Please see the history of present illness.    ROS All other systems reviewed and are negative.   PHYSICAL EXAM:   VS:  BP  134/82   Pulse 63   Ht 6' (1.829 m)   Wt 188 lb 12.8 oz (85.6 kg)   SpO2 98%   BMI 25.61 kg/m    GEN: Well nourished, well developed, in no acute distress HEENT: normal Neck: no JVD or masses Cardiac: irreg irreg. No murmurs.. No rubs, or gallops,no edema  Respiratory:  clear to auscultation bilaterally, normal work of breathing GI: soft, nontender, nondistended, + BS MS: no deformity or atrophy Skin: warm and dry, no rash Neuro:  Alert and Oriented x 3, Strength and sensation are intact Psych: euthymic mood, full affect    Wt Readings from Last 3 Encounters:  11/25/19 188 lb 12.8 oz (85.6 kg)  04/27/19 187 lb (84.8 kg)  02/25/19 184 lb 15.5 oz (83.9 kg)      Studies/Labs Reviewed:   EKG:  EKG is ordered today. ECG shows  afib with CVR with slow VR, HR 52  Recent Labs: 11/26/2018: Magnesium 1.9 04/27/2019: ALT 25; BUN 15; Creatinine, Ser 1.07; Hemoglobin 15.3; Platelets 177; Potassium 4.2; Sodium 139   Lipid Panel    Component Value Date/Time   CHOL 130 04/27/2019 0840   TRIG 66 04/27/2019 0840   HDL 63 04/27/2019 0840   CHOLHDL 2.1 04/27/2019 0840   LDLCALC 53 04/27/2019 0840    Additional studies/ records that were reviewed today include:  TAVR OPERATIVE NOTE   Date of Procedure:11/25/2018  Preoperative Diagnosis:Severe Aortic Stenosis   Postoperative Diagnosis:Same   Procedure:   Valve in ValveTranscatheter Aortic Valve Replacement - PercutaneousrightTransfemoral Approach Edwards Sapien 3 THV (size 46m, model # 9600TFX, serial # [id number]  Co-Surgeons:Bryan KAlveria Apley MD and MSherren Mocha MD   Anesthesiologist:Dan STobias Alexander MD  EDala Dock  MD.  Pre-operative Echo Findings: ? Severeprosthetic aortic valve insulfficiency ? Normalleft ventricular systolic function  Post-operative Echo Findings: ? Residual focalparavalvular leak around old bioprosthetic valve ? Normalleft ventricular systolic function  _____________   Echo 11/26/18: IMPRESSIONS 1. The left ventricle has low normal systolic function, with an ejection fraction of 50-55%. The cavity size was normal. Moderate basal septal hypertrophy. Left ventricular diastolic function could not be evaluated secondary to atrial fibrillation.  Elevated left ventricular end-diastolic pressure. 2. The right ventricle has normal systolic function. The cavity was normal. There is no increase in right ventricular wall thickness. 3. Left atrial size was moderately dilated. 4. Right atrial size was moderately dilated. 5. The mitral valve is degenerative. Moderate thickening of the mitral valve leaflet. Moderate calcification of the mitral valve leaflet. There is severe mitral annular calcification present. Mild mitral valve stenosis. MV Mean grad: 5.3 mmHg 6. Tricuspid valve regurgitation is mild-moderate. 7. There is mild dilatation of the ascending aorta measuring 39 mm. 8. - TAVR: S/P 259mEdwards Sapien THV which appears to be functioning normally. There is no perivalvular leak. The mean AV gradient is 10.38m28m, AVA (Vmax) 2.14cm2, dimensionless index 0.55 and AV Vmax 219 cm/s.  _____________   Echo 12/17/18 IMPRESSIONS  1. The left ventricle has low normal systolic function, with an ejection fraction of 50-55%. The cavity size was normal. There is severe asymmetric left ventricular hypertrophy. Left ventricular diastolic function could not be evaluated due to mitral  valve replacement/repair. No evidence of left ventricular regional wall motion abnormalities.  2. The right ventricle has normal systolic function. The cavity was mildly enlarged. There is no  increase in right ventricular wall thickness.  3. Left atrial size was mildly dilated.  4. Right atrial size was moderately dilated.  5. A Sorin annuloplasty ring valve is present in the mitral position.  6. Mild mitral valve stenosis.  7. The tricuspid valve is grossly normal.  8. A 23m an EWende CreaseSapien bioprosthetic aortic valve (TAVR) valve is present in the aortic position. Procedure Date: 11/26/2018 Echo findings are consistent with perivalvular leak of the aortic prosthesis.  9. Aortic valve regurgitation is mild by color flow Doppler. Mild stenosis of the aortic valve. 10. The aorta is abnormal in size and structure. 11. There is mild dilatation of the ascending aorta measuring 39 mm. SUMMARY LVEF 50-55%, moderate assymtric septal hypertrophy, mildly diltated RV with normal systolic function, mild LAE, moderate RAE, s/p 23 mm Edwards sapien 3 TAVR with mean gradient 14 mmHg and mild perivalvular leak, s/p mitral annuloplasty repair with mild stenosis and trivial MR, trivial TR, IVC not well-visualized  FINDINGS Left Ventricle: The left ventricle has low normal systolic function, with an ejection fraction of 50-55%. The cavity size was normal. There is severe asymmetric left ventricular hypertrophy. Left ventricular diastolic function could not be evaluated due to mitral valve replacement/repair. No evidence of left ventricular regional wall motion abnormalities..  _________________  Echo 11/25/19 IMPRESSIONS  1. Left ventricular ejection fraction, by estimation, is 55 to 60%. The left ventricle has normal function. The left ventricle has no regional wall motion abnormalities. There is mild concentric left ventricular hypertrophy. Left ventricular diastolic  function could not be evaluated.  2. Right ventricular systolic function is mildly reduced. The right ventricular size is mildly enlarged. There is mildly elevated pulmonary artery systolic pressure.  3. Left atrial size was  moderately dilated.  4. Right atrial size was severely dilated.  5. The mitral valve has been repaired/replaced. Trivial mitral valve regurgitation. Mild mitral stenosis. There is a 28 mm prosthetic annuloplasty ring present in the mitral position.  6. Tricuspid valve regurgitation is mild to moderate.  7. The aortic valve has been repaired/replaced. Aortic valve regurgitation is mild. There is a 23 mm Edwards Sapien prosthetic (TAVR) valve present in the aortic position. Procedure Date: 11/25/2018. Aortic regurgitation PHT measures 707 msec. Aortic  valve mean gradient measures 6.0 mmHg.  8. Aortic dilatation noted. There is mild to moderate dilatation of the ascending aorta measuring 39 mm.  9. The inferior vena cava is dilated in size with <50% respiratory variability, suggesting right atrial pressure of 15 mmHg.  Comparison(s): No significant change from prior study.   ASSESSMENT & PLAN:   Prosthetic valve dysfunction s/p valve in valve TAVR: echo today shows EF 55%, normally functioning TAVR with a mean gradient of 6 mm hg mmHg and mild PVL. Has amoxicillin for SBE prophylaxis. Continue on Eliquis 523mBID alone. He has NYHA class II symptoms with mild fatigue and dyspnea recently. Will check basic labs  S/p mitral valve repair: echo today shows mild mitral stenosis and trivial MR.   HTN: BP well controlled today. No changes made.   Chronic diastolic CHF: he appears euvolemic on lasix 2071mRN.   CAD s/p CABG: pre TAVR cath showed patent SVG-->OM. Continue medical therapy. No exertional chest pain. No aspirin given long term oral anticoagulation.   Recurrent DVT/PE:continue chronic anticoagulation   Persistent afib: rate well controlled today with HR 50s. Continue on Eliquis for thromboembolic prophylaxis. Check labs today.    Medication Adjustments/Labs and Tests Ordered: Current medicines are reviewed at length with the patient today.  Concerns regarding medicines are  outlined above.  Medication changes, Labs and Tests ordered today  are listed in the Patient Instructions below. Patient Instructions  Medication Instructions:  Your physician recommends that you continue on your current medications as directed. Please refer to the Current Medication list given to you today.   *If you need a refill on your cardiac medications before your next appointment, please call your pharmacy*   Lab Work: BMET, CBC and TSH today  If you have labs (blood work) drawn today and your tests are completely normal, you will receive your results only by: Marland Kitchen MyChart Message (if you have MyChart) OR . A paper copy in the mail If you have any lab test that is abnormal or we need to change your treatment, we will call you to review the results.   Testing/Procedures: None   Follow-Up: At [facility], you and your health needs are our priority.  As part of our continuing mission to provide you with exceptional heart care, we have created designated Provider Care Teams.  These Care Teams include your primary Cardiologist (physician) and Advanced Practice Providers (APPs -  Physician Assistants and Nurse Practitioners) who all work together to provide you with the care you need, when you need it.  We recommend signing up for the patient portal called "MyChart".  Sign up information is provided on this After Visit Summary.  MyChart is used to connect with patients for Virtual Visits (Telemedicine).  Patients are able to view lab/test results, encounter notes, upcoming appointments, etc.  Non-urgent messages can be sent to your provider as well.   To learn more about what you can do with MyChart, go to [email or web address].    Your next appointment:   5 month(s)  The format for your next appointment:   In Person  Provider:   You may see Eric Grooms, MD or one of the following Advanced Practice Providers on your designated Care Team:    Melina Copa, PA-C  Ermalinda Barrios, PA-C    Other Instructions      Signed, Angelena Form, PA-C  11/26/2019 4:00 AM    [facility] [address], [place], [place]  [address] Phone: [phone]; Fax: [phone]

## 2019-11-25 ENCOUNTER — Ambulatory Visit: Payer: Medicare Other | Admitting: Physician Assistant

## 2019-11-25 ENCOUNTER — Ambulatory Visit (HOSPITAL_COMMUNITY): Payer: Medicare Other | Attending: Cardiology

## 2019-11-25 ENCOUNTER — Encounter: Payer: Self-pay | Admitting: Physician Assistant

## 2019-11-25 ENCOUNTER — Other Ambulatory Visit: Payer: Self-pay

## 2019-11-25 VITALS — BP 134/82 | HR 63 | Ht 72.0 in | Wt 188.8 lb

## 2019-11-25 DIAGNOSIS — I10 Essential (primary) hypertension: Secondary | ICD-10-CM | POA: Diagnosis not present

## 2019-11-25 DIAGNOSIS — Z9889 Other specified postprocedural states: Secondary | ICD-10-CM | POA: Diagnosis not present

## 2019-11-25 DIAGNOSIS — I482 Chronic atrial fibrillation, unspecified: Secondary | ICD-10-CM | POA: Diagnosis not present

## 2019-11-25 DIAGNOSIS — R5383 Other fatigue: Secondary | ICD-10-CM | POA: Diagnosis not present

## 2019-11-25 DIAGNOSIS — Z952 Presence of prosthetic heart valve: Secondary | ICD-10-CM | POA: Diagnosis present

## 2019-11-25 DIAGNOSIS — Z86718 Personal history of other venous thrombosis and embolism: Secondary | ICD-10-CM

## 2019-11-25 DIAGNOSIS — I2510 Atherosclerotic heart disease of native coronary artery without angina pectoris: Secondary | ICD-10-CM

## 2019-11-25 DIAGNOSIS — I5032 Chronic diastolic (congestive) heart failure: Secondary | ICD-10-CM

## 2019-11-25 LAB — ECHOCARDIOGRAM COMPLETE
Height: 72 in
Weight: 3020.8 oz

## 2019-11-25 NOTE — Patient Instructions (Signed)
Medication Instructions:  Your physician recommends that you continue on your current medications as directed. Please refer to the Current Medication list given to you today.   *If you need a refill on your cardiac medications before your next appointment, please call your pharmacy*   Lab Work: BMET, CBC and TSH today  If you have labs (blood work) drawn today and your tests are completely normal, you will receive your results only by: Marland Kitchen MyChart Message (if you have MyChart) OR . A paper copy in the mail If you have any lab test that is abnormal or we need to change your treatment, we will call you to review the results.   Testing/Procedures: None   Follow-Up: At [facility], you and your health needs are our priority.  As part of our continuing mission to provide you with exceptional heart care, we have created designated Provider Care Teams.  These Care Teams include your primary Cardiologist (physician) and Advanced Practice Providers (APPs -  Physician Assistants and Nurse Practitioners) who all work together to provide you with the care you need, when you need it.  We recommend signing up for the patient portal called "MyChart".  Sign up information is provided on this After Visit Summary.  MyChart is used to connect with patients for Virtual Visits (Telemedicine).  Patients are able to view lab/test results, encounter notes, upcoming appointments, etc.  Non-urgent messages can be sent to your provider as well.   To learn more about what you can do with MyChart, go to [email or web address].    Your next appointment:   5 month(s)  The format for your next appointment:   In Person  Provider:   You may see [name], MD or one of the following Advanced Practice Providers on your designated Care Team:    Melina Copa, PA-C  Ermalinda Barrios, PA-C    Other Instructions

## 2019-11-26 LAB — BASIC METABOLIC PANEL
BUN/Creatinine Ratio: 19 (ref 10–24)
BUN: 19 mg/dL (ref 8–27)
CO2: 27 mmol/L (ref 20–29)
Calcium: 9.5 mg/dL (ref 8.6–10.2)
Chloride: 101 mmol/L (ref 96–106)
Creatinine, Ser: 1 mg/dL (ref 0.76–1.27)
GFR calc Af Amer: 77 mL/min/{1.73_m2} (ref >59)
GFR calc non Af Amer: 67 mL/min/{1.73_m2} (ref >59)
Glucose: 87 mg/dL (ref 65–99)
Potassium: 4.4 mmol/L (ref 3.5–5.2)
Sodium: 139 mmol/L (ref 134–144)

## 2019-11-26 LAB — CBC
Hematocrit: 48 % (ref 37.5–51.0)
Hemoglobin: 16 g/dL (ref 13.0–17.7)
MCH: 31.1 pg (ref 26.6–33.0)
MCHC: 33.3 g/dL (ref 31.5–35.7)
MCV: 93 fL (ref 79–97)
Platelets: 151 10*3/uL (ref 150–450)
RBC: 5.15 x10E6/uL (ref 4.14–5.80)
RDW: 12.3 % (ref 11.6–15.4)
WBC: 4.9 10*3/uL (ref 3.4–10.8)

## 2019-11-26 LAB — TSH: TSH: 3.01 u[IU]/mL (ref 0.450–4.500)

## 2019-11-26 NOTE — Addendum Note (Signed)
Addended by: Carylon Perches on: 11/26/2019 01:43 PM   Modules accepted: Orders

## 2019-11-30 ENCOUNTER — Other Ambulatory Visit: Payer: Self-pay | Admitting: Physician Assistant

## 2019-11-30 DIAGNOSIS — N179 Acute kidney failure, unspecified: Secondary | ICD-10-CM

## 2020-01-01 ENCOUNTER — Telehealth: Payer: Self-pay | Admitting: Interventional Cardiology

## 2020-01-01 NOTE — Telephone Encounter (Signed)
Left message for patient to call back  

## 2020-01-01 NOTE — Telephone Encounter (Signed)
    New Message:    Wife called and said pt went to the eye doctor today. They want him to take HydroEye. She wants to know if this is alright for him to take with his Eliquis?

## 2020-01-01 NOTE — Telephone Encounter (Signed)
Yes this is ok to take with his other meds

## 2020-01-04 NOTE — Telephone Encounter (Signed)
Returned call to patients wife (DPR on file) and informed her of recommendation below that per our pharmacist it is okay to take the hydroeye along with current meds including the eliquis. She verbalized her understanding and thanked me for the return call.

## 2020-01-04 NOTE — Telephone Encounter (Signed)
Patient's wife returning call. 

## 2020-01-27 ENCOUNTER — Encounter: Payer: Self-pay | Admitting: Physical Therapy

## 2020-01-27 ENCOUNTER — Ambulatory Visit: Payer: Medicare Other | Attending: Physician Assistant | Admitting: Physical Therapy

## 2020-01-27 ENCOUNTER — Other Ambulatory Visit: Payer: Self-pay

## 2020-01-27 DIAGNOSIS — R262 Difficulty in walking, not elsewhere classified: Secondary | ICD-10-CM | POA: Insufficient documentation

## 2020-01-27 DIAGNOSIS — R2689 Other abnormalities of gait and mobility: Secondary | ICD-10-CM | POA: Diagnosis present

## 2020-01-27 NOTE — Therapy (Signed)
[address] [place], [name], [address] Phone: [phone]   Fax:  [phone]  Physical Therapy Evaluation  Patient Details  Name: BRASON BERTHELOT MRN: [id number] Date of Birth: 12-01-1929 Referring Provider (PT): [place]   Encounter Date: 01/27/2020   PT End of Session - 01/27/20 1158    Visit Number 1    Date for PT Re-Evaluation 03/28/20    PT Start Time 3614    PT Stop Time 1053    PT Time Calculation (min) 38 min    Activity Tolerance Patient tolerated treatment well    Behavior During Therapy [facility] for tasks assessed/performed           Past Medical History:  Diagnosis Date  . Atrial flutter, paroxysmal ([place])    a. dx 04-02-2014, s/p  successful cardioversion 04-06-2014.  Marland Kitchen Chronic anticoagulation    on Eliquis--  due to recurrent dvt's and pe's  . Dyslipidemia   . History of bladder cancer urologist-  dr [name]   02-13-2016  s/p TURBT per path high grade papillary urothelial carcinoma  . History of DVT (deep vein thrombosis)    2000-- RLE  . History of melanoma excision    left flank;  07/ 2014 left lower leg and right upper chest  . History of prostate cancer    Gleason 6--  s/p  radical prostatectomy 06/ 2000 in [place]---  no recurrence  . History of pulmonary embolus (PE)    2000 & 2005  bilateral  . Hx of valvuloplasty 06/19/2007   a. s/p mitral ring annuloplasty, repair ruptured chordae of P2 flail segments of MV  . S/P aortic valve replacement with bioprosthetic valve    06-19-2007  severe aortic valve stenosis  . 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 Single vessel coronary artery disease   cardiologist-  dr Irish Lack   a. 05/2007 CABG x 1: s/p SVG-OM;  b. 10/2010 Ex MV: EF 72%, inf attenuation w/o ischemia, brief run of PAT with exercise. To  . Thrombocytopenia ([place])   . Thyroid goiter 2013   nodular  . Wears hearing aid    BILATERAL  . Wears partial dentures    UPPER     Past Surgical History:  Procedure Laterality Date  . AORTIC VALVE REPLACEMENT (AVR)/CORONARY ARTERY BYPASS GRAFTING (CABG)  06-19-2007  dr [name]   SVG to OM1 ;   AVR w/ #23 Mitralflow pericardial and ligation left atrial appendage;  MV repair w/ #28 Sorin 3-D memo Ring Annuloplasty with repair ruptured chordar of P-2 flail segments  . CARDIAC CATHETERIZATION  05-12-2007  dr Leonia Reeves   single vessel 30% ostial LAD,  80% LCx;  severe to critial AS,  moderate MR,  normal LVF, ef 70%,  normal right heart pressures and cardiac outputs,  mild elevated LV end-diastolic pressure    . CARDIOVASCULAR STRESS TEST  11-02-2010  dr Irish Lack   normal nuclear study w/ no ischemia or infarct/scar/  normal LV function and wall motion , ef 72%  . CARDIOVERSION N/A 04/06/2014   Procedure: CARDIOVERSION;  Surgeon: [name], MD;  Location: [place];  Service: Cardiovascular;  Laterality: N/A;   successful  . CATARACT EXTRACTION W/ INTRAOCULAR LENS  IMPLANT, BILATERAL  2012  approx  . CORONARY ARTERY BYPASS GRAFT    . RETROPUBIC RADICAL PROSTATECTOMY  06/ 2000  in [place]  . RIGHT/LEFT HEART CATH AND CORONARY/GRAFT ANGIOGRAPHY N/A 10/29/2018   Procedure: RIGHT/LEFT HEART CATH  AND CORONARY/GRAFT ANGIOGRAPHY;  Surgeon: [name], MD;  Location: [place] CV LAB;  Service: Cardiovascular;  Laterality: N/A;  . ROTATOR CUFF REPAIR Left 09/1998  . TEE WITHOUT CARDIOVERSION N/A 04/06/2014   Procedure: TRANSESOPHAGEAL ECHOCARDIOGRAM (TEE);  Surgeon: [name], MD;  Location: [facility] ENDOSCOPY;  Service: Cardiovascular;  Laterality: N/A;  mild focal basa LVH of the septum, ef 50-55%, s/p AV annuloplasty ring, elongated chordae, thickened leaflets, mild to mod. MR, multiple small jets/arotic bioprosthetic valve sits well in position, mild AI/ thickened TV w/ mild reurg./mild LA  . TEE WITHOUT CARDIOVERSION N/A 10/22/2018   Procedure: TRANSESOPHAGEAL ECHOCARDIOGRAM (TEE);  Surgeon: [name],  MD;  Location: [facility] ENDOSCOPY;  Service: Cardiovascular;  Laterality: N/A;  . TEE WITHOUT CARDIOVERSION N/A 11/25/2018   Procedure: TRANSESOPHAGEAL ECHOCARDIOGRAM (TEE);  Surgeon: [name], MD;  Location: [place];  Service: Open Heart Surgery;  Laterality: N/A;  . TRANSCATHETER AORTIC VALVE REPLACEMENT, TRANSFEMORAL N/A 11/25/2018   Procedure: TRANSCATHETER AORTIC VALVE REPLACEMENT, TRANSFEMORAL;  Surgeon: [name], MD;  Location: [place];  Service: Open Heart Surgery;  Laterality: N/A;  . TRANSTHORACIC ECHOCARDIOGRAM  03-23-2016   dr Irish Lack   EF 55-60%, reduced contribution atrial contraction to ventricular filling, due to increased ventricular diastolic pressure or atrial contratile dysfunction/  bioprosthetic AV w/ mod. regurg. (valve area 1.97cm^2)/ mild dilated ascending aorta/ mild thicken MV normal function annular ring prosthesis/severe LAE & mod. to sev.RAE/ PASP 16mmHg/ mild TR/ ventricle septal motion show paradox  . TRANSURETHRAL RESECTION OF BLADDER TUMOR N/A 02/13/2016   Procedure: TRANSURETHRAL RESECTION OF BLADDER TUMOR (TURBT);  Surgeon: Nickie Retort, MD;  Location: [facility];  Service: Urology;  Laterality: N/A;  . TRANSURETHRAL RESECTION OF BLADDER TUMOR N/A 09/04/2016   Procedure: TRANSURETHRAL RESECTION OF BLADDER TUMOR (TURBT);  Surgeon: Nickie Retort, MD;  Location: [facility];  Service: Urology;  Laterality: N/A;    There were no vitals filed for this visit.    Subjective Assessment - 01/27/20 1015    Subjective Patient reports that he has had some balanc eissues for a few years.  He reports 2-3 falls in the past 6 months.  He reports that he has had falls outside and inside the home.  He denies dizziness    Patient Stated Goals not fall, be nmore steady on feet    Currently in Pain? No/denies              [facility] PT Assessment - 01/27/20 0001      Assessment   Medical Diagnosis unsteady gait    Referring Provider  (PT) [place]    Onset Date/Surgical Date 01/13/20    Prior Therapy over a year ago      Home Environment   Additional Comments stairs, does yardwork and gardening      Prior Function   Level of [place] Retired    Leisure likes to garden, no exercise      ROM / Strength   AROM / PROM / Strength AROM;Strength      AROM   Overall AROM Comments Lumbar ROM is decreased 50% for all motions      Strength   Overall Strength Comments Hips 4+/5, ankles 3+/5, knees 4-/5      Flexibility   Soft Tissue Assessment /Muscle Length yes    Hamstrings mild tightness    ITB tight    Piriformis tight      Standardized Balance Assessment  Standardized Balance Assessment Timed Up and Go Test;Berg Balance Test      Berg Balance Test   Sit to Stand Able to stand without using hands and stabilize independently    Standing Unsupported Able to stand safely 2 minutes    Sitting with Back Unsupported but Feet Supported on Floor or Stool Able to sit safely and securely 2 minutes    Stand to Sit Sits safely with minimal use of hands    Transfers Able to transfer safely, minor use of hands    Standing Unsupported with Eyes Closed Able to stand 10 seconds with supervision    Standing Unsupported with Feet Together Able to place feet together independently and stand 1 minute safely    From Standing, Reach Forward with Outstretched Arm Can reach forward >12 cm safely (5")    From Standing Position, Pick up Object from Floor Able to pick up shoe safely and easily    From Standing Position, Turn to Look Behind Over each Shoulder Turn sideways only but maintains balance    Turn 360 Degrees Able to turn 360 degrees safely but slowly    Standing Unsupported, Alternately Place Feet on Step/Stool Able to stand independently and complete 8 steps >20 seconds    Standing Unsupported, One Foot in Front Able to take small step independently and hold 30 seconds    Standing on One Leg Tries  to lift leg/unable to hold 3 seconds but remains standing independently    Total Score 44      Timed Up and Go Test   Normal TUG (seconds) 15    TUG Comments no device                      Objective measurements completed on examination: See above findings.               PT Education - 01/27/20 1157    Education Details HEP for balance change in BOS and surface with some eyes closed and head turns    Person(s) Educated Patient    Methods Explanation;Demonstration;Handout    Comprehension Verbalized understanding            PT Short Term Goals - 01/27/20 1201      PT SHORT TERM GOAL #1   Title Pt will be I and compliant with initial HEP.    Time 2    Period Weeks    Status New             PT Long Term Goals - 01/27/20 1201      PT LONG TERM GOAL #1   Title Pt will be I and compliant with HEP.    Time 8    Period Weeks    Status New      PT LONG TERM GOAL #2   Title Pt will improv BERG balance score >50 to show improved balance    Time 8    Period Weeks    Status New      PT LONG TERM GOAL #3   Title decrease TUG time to 12 seconds    Time 8    Period Weeks    Status New      PT LONG TERM GOAL #4   Title increase knee and ankle strength to 4+/5    Time 8    Period Weeks    Status New  Plan - 01/27/20 1159    Clinical Impression Statement Patient reports increase of unsteady gait , reports 3 falls in the past 6 months, he reports that his wife says he shuffles his feet.  TUG 15 seconds, Berg 44/56.  Putting him at an increased risk for falls.  He does have weakness in the knees and ankles, has some shuffling gait.    Stability/Clinical Decision Making Stable/Uncomplicated    Clinical Decision Making Low    Rehab Potential Good    PT Frequency 2x / week    PT Duration 8 weeks    PT Treatment/Interventions ADLs/Self Care Home Management;Stair training;Functional mobility training;Therapeutic  activities;Gait training;Neuromuscular re-education;Balance training;Therapeutic exercise;Patient/family education           Patient will benefit from skilled therapeutic intervention in order to improve the following deficits and impairments:  Abnormal gait, Decreased range of motion, Difficulty walking, Decreased balance, Decreased strength  Visit Diagnosis: Difficulty in walking, not elsewhere classified - Plan: PT plan of care cert/re-cert  Other abnormalities of gait and mobility - Plan: PT plan of care cert/re-cert     Problem List Patient Active Problem List   Diagnosis Date Noted  . History of mitral valve repair 11/26/2018  . Prosthetic valve dysfunction 11/25/2018  . Ectatic abdominal aorta ([place]) 11/25/2018  . 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 History of bladder cancer   . History of pulmonary embolus (PE)   . Acute on chronic diastolic heart failure ([place]) 04/23/2018  . History of aortic valve replacement 04/23/2018  . Atrial fibrillation ([place]) 04/23/2018  . Coronary atherosclerosis of native coronary artery 02/25/2014  . Hyperlipidemia 02/25/2014  . Embolism and thrombosis ([place]) 03/11/2013    Mischa Brittingham W.,PT 01/27/2020, 12:04 PM  [address] [place], [name], [address] Phone: [phone]   Fax:  [phone]  Name: SCHNEIDER WARCHOL MRN: [id number] Date of Birth: July 03, 1929

## 2020-02-02 ENCOUNTER — Ambulatory Visit: Payer: Medicare Other | Admitting: Physical Therapy

## 2020-02-02 ENCOUNTER — Other Ambulatory Visit: Payer: Self-pay

## 2020-02-02 ENCOUNTER — Encounter: Payer: Self-pay | Admitting: Physical Therapy

## 2020-02-02 DIAGNOSIS — R2689 Other abnormalities of gait and mobility: Secondary | ICD-10-CM

## 2020-02-02 DIAGNOSIS — R262 Difficulty in walking, not elsewhere classified: Secondary | ICD-10-CM

## 2020-02-02 NOTE — Therapy (Signed)
[address] [place], [name], [address] Phone: [phone]   Fax:  [phone]  Physical Therapy Treatment  Patient Details  Name: Eric [name] MRN: [id number] Date of Birth: Dec 01, 1929 Referring Provider (PT): [place]   Encounter Date: 02/02/2020   PT End of Session - 02/02/20 0840    Visit Number 2    Date for PT Re-Evaluation 03/28/20    PT Start Time 0758    PT Stop Time 0839    PT Time Calculation (min) 41 min    Activity Tolerance Patient tolerated treatment well    Behavior During Therapy [facility] for tasks assessed/performed           Past Medical History:  Diagnosis Date  . Atrial flutter, paroxysmal ([place])    a. dx 04-02-2014, s/p  successful cardioversion 04-06-2014.  Marland Kitchen Chronic anticoagulation    on Eliquis--  due to recurrent dvt's and pe's  . Dyslipidemia   . History of bladder cancer urologist-  dr [name]   02-13-2016  s/p TURBT per path high grade papillary urothelial carcinoma  . History of DVT (deep vein thrombosis)    2000-- RLE  . History of melanoma excision    left flank;  07/ 2014 left lower leg and right upper chest  . History of prostate cancer    Gleason 6--  s/p  radical prostatectomy 06/ 2000 in [place]---  no recurrence  . History of pulmonary embolus (PE)    2000 & 2005  bilateral  . Hx of valvuloplasty 06/19/2007   a. s/p mitral ring annuloplasty, repair ruptured chordae of P2 flail segments of MV  . S/P aortic valve replacement with bioprosthetic valve    06-19-2007  severe aortic valve stenosis  . 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 Single vessel coronary artery disease   cardiologist-  dr Irish Lack   a. 05/2007 CABG x 1: s/p SVG-OM;  b. 10/2010 Ex MV: EF 72%, inf attenuation w/o ischemia, brief run of PAT with exercise. To  . Thrombocytopenia ([place])   . Thyroid goiter 2013   nodular  . Wears hearing aid    BILATERAL  . Wears partial dentures    UPPER     Past Surgical History:  Procedure Laterality Date  . AORTIC VALVE REPLACEMENT (AVR)/CORONARY ARTERY BYPASS GRAFTING (CABG)  06-19-2007  dr [name]   SVG to OM1 ;   AVR w/ #23 Mitralflow pericardial and ligation left atrial appendage;  MV repair w/ #28 Sorin 3-D memo Ring Annuloplasty with repair ruptured chordar of P-2 flail segments  . CARDIAC CATHETERIZATION  05-12-2007  dr Leonia Reeves   single vessel 30% ostial LAD,  80% LCx;  severe to critial AS,  moderate MR,  normal LVF, ef 70%,  normal right heart pressures and cardiac outputs,  mild elevated LV end-diastolic pressure    . CARDIOVASCULAR STRESS TEST  11-02-2010  dr Irish Lack   normal nuclear study w/ no ischemia or infarct/scar/  normal LV function and wall motion , ef 72%  . CARDIOVERSION N/A 04/06/2014   Procedure: CARDIOVERSION;  Surgeon: [name], MD;  Location: [place];  Service: Cardiovascular;  Laterality: N/A;   successful  . CATARACT EXTRACTION W/ INTRAOCULAR LENS  IMPLANT, BILATERAL  2012  approx  . CORONARY ARTERY BYPASS GRAFT    . RETROPUBIC RADICAL PROSTATECTOMY  06/ 2000  in [place], [place]  . RIGHT/LEFT HEART CATH AND CORONARY/GRAFT ANGIOGRAPHY N/A 10/29/2018   Procedure: RIGHT/LEFT HEART CATH  AND CORONARY/GRAFT ANGIOGRAPHY;  Surgeon: [name], MD;  Location: [place] CV LAB;  Service: Cardiovascular;  Laterality: N/A;  . ROTATOR CUFF REPAIR Left 09/1998  . TEE WITHOUT CARDIOVERSION N/A 04/06/2014   Procedure: TRANSESOPHAGEAL ECHOCARDIOGRAM (TEE);  Surgeon: [name], MD;  Location: [facility] ENDOSCOPY;  Service: Cardiovascular;  Laterality: N/A;  mild focal basa LVH of the septum, ef 50-55%, s/p AV annuloplasty ring, elongated chordae, thickened leaflets, mild to mod. MR, multiple small jets/arotic bioprosthetic valve sits well in position, mild AI/ thickened TV w/ mild reurg./mild LA  . TEE WITHOUT CARDIOVERSION N/A 10/22/2018   Procedure: TRANSESOPHAGEAL ECHOCARDIOGRAM (TEE);  Surgeon: [name],  MD;  Location: [facility] ENDOSCOPY;  Service: Cardiovascular;  Laterality: N/A;  . TEE WITHOUT CARDIOVERSION N/A 11/25/2018   Procedure: TRANSESOPHAGEAL ECHOCARDIOGRAM (TEE);  Surgeon: [name], MD;  Location: [place];  Service: Open Heart Surgery;  Laterality: N/A;  . TRANSCATHETER AORTIC VALVE REPLACEMENT, TRANSFEMORAL N/A 11/25/2018   Procedure: TRANSCATHETER AORTIC VALVE REPLACEMENT, TRANSFEMORAL;  Surgeon: [name], MD;  Location: [place];  Service: Open Heart Surgery;  Laterality: N/A;  . TRANSTHORACIC ECHOCARDIOGRAM  03-23-2016   dr Irish Lack   EF 55-60%, reduced contribution atrial contraction to ventricular filling, due to increased ventricular diastolic pressure or atrial contratile dysfunction/  bioprosthetic AV w/ mod. regurg. (valve area 1.97cm^2)/ mild dilated ascending aorta/ mild thicken MV normal function annular ring prosthesis/severe LAE & mod. to sev.RAE/ PASP 39mmHg/ mild TR/ ventricle septal motion show paradox  . TRANSURETHRAL RESECTION OF BLADDER TUMOR N/A 02/13/2016   Procedure: TRANSURETHRAL RESECTION OF BLADDER TUMOR (TURBT);  Surgeon: Nickie Retort, MD;  Location: [facility];  Service: Urology;  Laterality: N/A;  . TRANSURETHRAL RESECTION OF BLADDER TUMOR N/A 09/04/2016   Procedure: TRANSURETHRAL RESECTION OF BLADDER TUMOR (TURBT);  Surgeon: Nickie Retort, MD;  Location: [facility];  Service: Urology;  Laterality: N/A;    There were no vitals filed for this visit.   Subjective Assessment - 02/02/20 0805    Subjective Reports back pain when he got up this moring, having difficulty getting up from sitting    Currently in Pain? Yes    Pain Score 3     Pain Location Back    Pain Orientation Lower    Pain Descriptors / Indicators Sharp    Aggravating Factors  getting up from sitting                             [facility] Adult PT Treatment/Exercise - 02/02/20 0001      Ambulation/Gait   Gait Comments gait outside  on uneven terrain, educaiton on safety outside wathcing for holes, roots      High Level Balance   High Level Balance Activities Side stepping;Backward walking;Negotiating over obstacles;Tandem walking    High Level Balance Comments standing ball toss, on airex ball toss,       Exercises   Exercises Knee/Hip      Knee/Hip Exercises: Aerobic   Nustep level 4 x 6 minutes    Other Aerobic UBE level 4 x 4 minutes      Knee/Hip Exercises: Machines for Strengthening   Cybex Knee Extension 5# 2x10    Cybex Knee Flexion 25# 2x10                    PT Short Term Goals - 02/02/20 0842      PT SHORT TERM GOAL #1  Title Pt will be I and compliant with initial HEP.    Status Achieved             PT Long Term Goals - 01/27/20 1201      PT LONG TERM GOAL #1   Title Pt will be I and compliant with HEP.    Time 8    Period Weeks    Status New      PT LONG TERM GOAL #2   Title Pt will improv BERG balance score >50 to show improved balance    Time 8    Period Weeks    Status New      PT LONG TERM GOAL #3   Title decrease TUG time to 12 seconds    Time 8    Period Weeks    Status New      PT LONG TERM GOAL #4   Title increase knee and ankle strength to 4+/5    Time 8    Period Weeks    Status New                 Plan - 02/02/20 0841    Clinical Impression Statement Patient had difficulty with airex standing, especially looking up, could not do tandem walk without CGA due to poor balance, he reports LBP started this AM had diffiuclty with getting up from sitting due to the back pain    PT Next Visit Plan work on balance, assess if the back pain is getting better    Consulted and Agree with Plan of Care Patient           Patient will benefit from skilled therapeutic intervention in order to improve the following deficits and impairments:  Abnormal gait, Decreased range of motion, Difficulty walking, Decreased balance, Decreased strength  Visit  Diagnosis: Difficulty in walking, not elsewhere classified  Other abnormalities of gait and mobility     Problem List Patient Active Problem List   Diagnosis Date Noted  . History of mitral valve repair 11/26/2018  . Prosthetic valve dysfunction 11/25/2018  . Ectatic abdominal aorta ([place]) 11/25/2018  . 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 History of bladder cancer   . History of pulmonary embolus ([place])   . Acute on chronic diastolic heart failure ([place]) 04/23/2018  . History of aortic valve replacement 04/23/2018  . Atrial fibrillation ([place]) 04/23/2018  . Coronary atherosclerosis of native coronary artery 02/25/2014  . Hyperlipidemia 02/25/2014  . Embolism and thrombosis ([place]) 03/11/2013    Sumner Boast., PT 02/02/2020, 8:43 AM  [address] [place], [name], [address] Phone: [phone]   Fax:  [phone]  Name: Eric [name] MRN: [id number] Date of Birth: 13-Jul-1929

## 2020-02-04 ENCOUNTER — Other Ambulatory Visit: Payer: Self-pay

## 2020-02-04 ENCOUNTER — Ambulatory Visit: Payer: Medicare Other | Admitting: Physical Therapy

## 2020-02-04 ENCOUNTER — Encounter: Payer: Self-pay | Admitting: Physical Therapy

## 2020-02-04 DIAGNOSIS — R262 Difficulty in walking, not elsewhere classified: Secondary | ICD-10-CM | POA: Diagnosis not present

## 2020-02-04 DIAGNOSIS — R2689 Other abnormalities of gait and mobility: Secondary | ICD-10-CM

## 2020-02-04 NOTE — Therapy (Signed)
[facility] [address] [place], [name], [address] Phone: [phone]   Fax:  [phone]  Physical Therapy Treatment  Patient Details  Name: Eric [name] MRN: [id number] Date of Birth: 1929-10-02 Referring Provider (PT): [place]   Encounter Date: 02/04/2020   PT End of Session - 02/04/20 1143    Visit Number 3    Date for PT Re-Evaluation 03/28/20    PT Start Time 1056    PT Stop Time 1143    PT Time Calculation (min) 47 min    Activity Tolerance Patient tolerated treatment well    Behavior During Therapy [facility] for tasks assessed/performed           Past Medical History:  Diagnosis Date  . Atrial flutter, paroxysmal ([place])    a. dx 04-02-2014, s/p  successful cardioversion 04-06-2014.  Marland Kitchen Chronic anticoagulation    on Eliquis--  due to recurrent dvt's and pe's  . Dyslipidemia   . History of bladder cancer urologist-  dr [name]   02-13-2016  s/p TURBT per path high grade papillary urothelial carcinoma  . History of DVT (deep vein thrombosis)    2000-- RLE  . History of melanoma excision    left flank;  07/ 2014 left lower leg and right upper chest  . History of prostate cancer    Gleason 6--  s/p  radical prostatectomy 06/ 2000 in [place]---  no recurrence  . History of pulmonary embolus (PE)    2000 & 2005  bilateral  . Hx of valvuloplasty 06/19/2007   a. s/p mitral ring annuloplasty, repair ruptured chordae of P2 flail segments of MV  . S/P aortic valve replacement with bioprosthetic valve    06-19-2007  severe aortic valve stenosis  . 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 Single vessel coronary artery disease   cardiologist-  dr Irish Lack   a. 05/2007 CABG x 1: s/p SVG-OM;  b. 10/2010 Ex MV: EF 72%, inf attenuation w/o ischemia, brief run of PAT with exercise. To  . Thrombocytopenia ([place])   . Thyroid goiter 2013   nodular  . Wears hearing aid    BILATERAL  . Wears partial dentures    UPPER     Past Surgical History:  Procedure Laterality Date  . AORTIC VALVE REPLACEMENT (AVR)/CORONARY ARTERY BYPASS GRAFTING (CABG)  06-19-2007  dr [name]   SVG to OM1 ;   AVR w/ #23 Mitralflow pericardial and ligation left atrial appendage;  MV repair w/ #28 Sorin 3-D memo Ring Annuloplasty with repair ruptured chordar of P-2 flail segments  . CARDIAC CATHETERIZATION  05-12-2007  dr Leonia Reeves   single vessel 30% ostial LAD,  80% LCx;  severe to critial AS,  moderate MR,  normal LVF, ef 70%,  normal right heart pressures and cardiac outputs,  mild elevated LV end-diastolic pressure    . CARDIOVASCULAR STRESS TEST  11-02-2010  dr Irish Lack   normal nuclear study w/ no ischemia or infarct/scar/  normal LV function and wall motion , ef 72%  . CARDIOVERSION N/A 04/06/2014   Procedure: CARDIOVERSION;  Surgeon: [name], MD;  Location: [place];  Service: Cardiovascular;  Laterality: N/A;   successful  . CATARACT EXTRACTION W/ INTRAOCULAR LENS  IMPLANT, BILATERAL  2012  approx  . CORONARY ARTERY BYPASS GRAFT    . RETROPUBIC RADICAL PROSTATECTOMY  06/ 2000  in [place]  . RIGHT/LEFT HEART CATH AND CORONARY/GRAFT ANGIOGRAPHY N/A 10/29/2018   Procedure: RIGHT/LEFT HEART CATH  AND CORONARY/GRAFT ANGIOGRAPHY;  Surgeon: [name], MD;  Location: [place] [facility];  Service: Cardiovascular;  Laterality: N/A;  . ROTATOR CUFF REPAIR Left 09/1998  . TEE WITHOUT CARDIOVERSION N/A 04/06/2014   Procedure: TRANSESOPHAGEAL ECHOCARDIOGRAM (TEE);  Surgeon: [name], MD;  Location: [facility] ENDOSCOPY;  Service: Cardiovascular;  Laterality: N/A;  mild focal basa LVH of the septum, ef 50-55%, s/p AV annuloplasty ring, elongated chordae, thickened leaflets, mild to mod. MR, multiple small jets/arotic bioprosthetic valve sits well in position, mild AI/ thickened TV w/ mild reurg./mild LA  . TEE WITHOUT CARDIOVERSION N/A 10/22/2018   Procedure: TRANSESOPHAGEAL ECHOCARDIOGRAM (TEE);  Surgeon: [name],  MD;  Location: [facility] ENDOSCOPY;  Service: Cardiovascular;  Laterality: N/A;  . TEE WITHOUT CARDIOVERSION N/A 11/25/2018   Procedure: TRANSESOPHAGEAL ECHOCARDIOGRAM (TEE);  Surgeon: [name], MD;  Location: [place];  Service: Open Heart Surgery;  Laterality: N/A;  . TRANSCATHETER AORTIC VALVE REPLACEMENT, TRANSFEMORAL N/A 11/25/2018   Procedure: TRANSCATHETER AORTIC VALVE REPLACEMENT, TRANSFEMORAL;  Surgeon: [name], MD;  Location: [place];  Service: Open Heart Surgery;  Laterality: N/A;  . TRANSTHORACIC ECHOCARDIOGRAM  03-23-2016   dr Irish Lack   EF 55-60%, reduced contribution atrial contraction to ventricular filling, due to increased ventricular diastolic pressure or atrial contratile dysfunction/  bioprosthetic AV w/ mod. regurg. (valve area 1.97cm^2)/ mild dilated ascending aorta/ mild thicken MV normal function annular ring prosthesis/severe LAE & mod. to sev.RAE/ PASP 37mmHg/ mild TR/ ventricle septal motion show paradox  . TRANSURETHRAL RESECTION OF BLADDER TUMOR N/A 02/13/2016   Procedure: TRANSURETHRAL RESECTION OF BLADDER TUMOR (TURBT);  Surgeon: Nickie Retort, MD;  Location: [facility];  Service: Urology;  Laterality: N/A;  . TRANSURETHRAL RESECTION OF BLADDER TUMOR N/A 09/04/2016   Procedure: TRANSURETHRAL RESECTION OF BLADDER TUMOR (TURBT);  Surgeon: Nickie Retort, MD;  Location: [facility];  Service: Urology;  Laterality: N/A;    There were no vitals filed for this visit.   Subjective Assessment - 02/04/20 1057    Subjective No falls or stumbles, reports some pain in the small of his back    Currently in Pain? Yes    Pain Score 5     Pain Location Back                             [facility] Adult PT Treatment/Exercise - 02/04/20 0001      High Level Balance   High Level Balance Activities Backward walking;Negotitating around obstacles;Negotiating over obstacles    High Level Balance Comments Forward and side step over  objects, Ball toss on solid and non compliant surface. Side steps on and off airex       Knee/Hip Exercises: Aerobic   Nustep level 4 x 6 minutes      Knee/Hip Exercises: Machines for Strengthening   Cybex Knee Extension 5# 2x10    Cybex Knee Flexion 25# 2x10      Knee/Hip Exercises: Standing   Other Standing Knee Exercises Alt taps with airex 2x10                     PT Short Term Goals - 02/02/20 3875      PT SHORT TERM GOAL #1   Title Pt will be I and compliant with initial HEP.    Status Achieved             PT Long Term Goals - 01/27/20 1201  PT LONG TERM GOAL #1   Title Pt will be I and compliant with HEP.    Time 8    Period Weeks    Status New      PT LONG TERM GOAL #2   Title Pt will improv BERG balance score >50 to show improved balance    Time 8    Period Weeks    Status New      PT LONG TERM GOAL #3   Title decrease TUG time to 12 seconds    Time 8    Period Weeks    Status New      PT LONG TERM GOAL #4   Title increase knee and ankle strength to 4+/5    Time 8    Period Weeks    Status New                 Plan - 02/04/20 1144    Clinical Impression Statement Pt had some difficulty today's with balance interventions. He did fair stepping over objects. Some LOB with Alt taps and when standing on airex. He has good righting reaction but does require min assist at times to recover.    Stability/Clinical Decision Making Stable/Uncomplicated    PT Frequency 2x / week    PT Duration 8 weeks    PT Treatment/Interventions ADLs/Self Care Home Management;Stair training;Functional mobility training;Therapeutic activities;Gait training;Neuromuscular re-education;Balance training;Therapeutic exercise;Patient/family education    PT Next Visit Plan work on balance, assess if the back pain is getting better           Patient will benefit from skilled therapeutic intervention in order to improve the following deficits and impairments:   Abnormal gait, Decreased range of motion, Difficulty walking, Decreased balance, Decreased strength  Visit Diagnosis: Difficulty in walking, not elsewhere classified  Other abnormalities of gait and mobility     Problem List Patient Active Problem List   Diagnosis Date Noted  . History of mitral valve repair 11/26/2018  . Prosthetic valve dysfunction 11/25/2018  . Ectatic abdominal aorta ([place]) 11/25/2018  . 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 History of bladder cancer   . History of pulmonary embolus (PE)   . Acute on chronic diastolic heart failure ([place]) 04/23/2018  . History of aortic valve replacement 04/23/2018  . Atrial fibrillation ([place]) 04/23/2018  . Coronary atherosclerosis of native coronary artery 02/25/2014  . Hyperlipidemia 02/25/2014  . Embolism and thrombosis ([place]) 03/11/2013    Scot Jun 02/04/2020, 11:47 AM  [address] [place], [name], [address] Phone: [phone]   Fax:  [phone]  Name: Eric [name] MRN: [id number] Date of Birth: [birth year]

## 2020-02-09 ENCOUNTER — Ambulatory Visit: Payer: Medicare Other | Admitting: Physical Therapy

## 2020-02-11 ENCOUNTER — Ambulatory Visit: Payer: Medicare Other | Admitting: Physical Therapy

## 2020-02-11 ENCOUNTER — Other Ambulatory Visit: Payer: Self-pay

## 2020-02-11 DIAGNOSIS — R262 Difficulty in walking, not elsewhere classified: Secondary | ICD-10-CM

## 2020-02-11 DIAGNOSIS — R2689 Other abnormalities of gait and mobility: Secondary | ICD-10-CM

## 2020-02-11 NOTE — Therapy (Signed)
[facility] [place] [place], [name], [address] Phone: [phone]   Fax:  [phone]  Physical Therapy Treatment  Patient Details  Name: Eric [name] MRN: [id number] Date of Birth: 1930/04/30 Referring Provider (PT): [place]   Encounter Date: 02/11/2020   PT End of Session - 02/11/20 0920    Visit Number 4    Date for PT Re-Evaluation 03/28/20    PT Start Time 0842    PT Stop Time 0935    PT Time Calculation (min) 53 min           Past Medical History:  Diagnosis Date  . Atrial flutter, paroxysmal ([place])    a. dx 04-02-2014, s/p  successful cardioversion 04-06-2014.  Marland Kitchen Chronic anticoagulation    on Eliquis--  due to recurrent dvt's and pe's  . Dyslipidemia   . History of bladder cancer urologist-  dr [name]   02-13-2016  s/p TURBT per path high grade papillary urothelial carcinoma  . History of DVT (deep vein thrombosis)    2000-- RLE  . History of melanoma excision    left flank;  07/ 2014 left lower leg and right upper chest  . History of prostate cancer    Gleason 6--  s/p  radical prostatectomy 06/ 2000 in [place]---  no recurrence  . History of pulmonary embolus (PE)    2000 & 2005  bilateral  . Hx of valvuloplasty 06/19/2007   a. s/p mitral ring annuloplasty, repair ruptured chordae of P2 flail segments of MV  . S/P aortic valve replacement with bioprosthetic valve    06-19-2007  severe aortic valve stenosis  . 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 Single vessel coronary artery disease   cardiologist-  dr Irish Lack   a. 05/2007 CABG x 1: s/p SVG-OM;  b. 10/2010 Ex MV: EF 72%, inf attenuation w/o ischemia, brief run of PAT with exercise. To  . Thrombocytopenia ([place])   . Thyroid goiter 2013   nodular  . Wears hearing aid    BILATERAL  . Wears partial dentures    UPPER    Past Surgical History:  Procedure Laterality Date  . AORTIC VALVE REPLACEMENT (AVR)/CORONARY ARTERY BYPASS GRAFTING (CABG)   06-19-2007  dr [name]   SVG to OM1 ;   AVR w/ #23 Mitralflow pericardial and ligation left atrial appendage;  MV repair w/ #28 Sorin 3-D memo Ring Annuloplasty with repair ruptured chordar of P-2 flail segments  . CARDIAC CATHETERIZATION  05-12-2007  dr Leonia Reeves   single vessel 30% ostial LAD,  80% LCx;  severe to critial AS,  moderate MR,  normal LVF, ef 70%,  normal right heart pressures and cardiac outputs,  mild elevated LV end-diastolic pressure    . CARDIOVASCULAR STRESS TEST  11-02-2010  dr Irish Lack   normal nuclear study w/ no ischemia or infarct/scar/  normal LV function and wall motion , ef 72%  . CARDIOVERSION N/A 04/06/2014   Procedure: CARDIOVERSION;  Surgeon: [name], MD;  Location: [facility];  Service: Cardiovascular;  Laterality: N/A;   successful  . CATARACT EXTRACTION W/ INTRAOCULAR LENS  IMPLANT, BILATERAL  2012  approx  . CORONARY ARTERY BYPASS GRAFT    . RETROPUBIC RADICAL PROSTATECTOMY  06/ 2000  in [place], [place]  . RIGHT/LEFT HEART CATH AND CORONARY/GRAFT ANGIOGRAPHY N/A 10/29/2018   Procedure: RIGHT/LEFT HEART CATH AND CORONARY/GRAFT ANGIOGRAPHY;  Surgeon: [name], MD;  Location: [place] [facility];  Service: Cardiovascular;  Laterality: N/A;  .  ROTATOR CUFF REPAIR Left 09/1998  . TEE WITHOUT CARDIOVERSION N/A 04/06/2014   Procedure: TRANSESOPHAGEAL ECHOCARDIOGRAM (TEE);  Surgeon: [name], MD;  Location: [facility] ENDOSCOPY;  Service: Cardiovascular;  Laterality: N/A;  mild focal basa LVH of the septum, ef 50-55%, s/p AV annuloplasty ring, elongated chordae, thickened leaflets, mild to mod. MR, multiple small jets/arotic bioprosthetic valve sits well in position, mild AI/ thickened TV w/ mild reurg./mild LA  . TEE WITHOUT CARDIOVERSION N/A 10/22/2018   Procedure: TRANSESOPHAGEAL ECHOCARDIOGRAM (TEE);  Surgeon: [name], MD;  Location: [facility] ENDOSCOPY;  Service: Cardiovascular;  Laterality: N/A;  . TEE WITHOUT CARDIOVERSION N/A 11/25/2018   Procedure:  TRANSESOPHAGEAL ECHOCARDIOGRAM (TEE);  Surgeon: [name], MD;  Location: [place];  Service: Open Heart Surgery;  Laterality: N/A;  . TRANSCATHETER AORTIC VALVE REPLACEMENT, TRANSFEMORAL N/A 11/25/2018   Procedure: TRANSCATHETER AORTIC VALVE REPLACEMENT, TRANSFEMORAL;  Surgeon: [name], MD;  Location: [place];  Service: Open Heart Surgery;  Laterality: N/A;  . TRANSTHORACIC ECHOCARDIOGRAM  03-23-2016   dr Irish Lack   EF 55-60%, reduced contribution atrial contraction to ventricular filling, due to increased ventricular diastolic pressure or atrial contratile dysfunction/  bioprosthetic AV w/ mod. regurg. (valve area 1.97cm^2)/ mild dilated ascending aorta/ mild thicken MV normal function annular ring prosthesis/severe LAE & mod. to sev.RAE/ PASP 89mmHg/ mild TR/ ventricle septal motion show paradox  . TRANSURETHRAL RESECTION OF BLADDER TUMOR N/A 02/13/2016   Procedure: TRANSURETHRAL RESECTION OF BLADDER TUMOR (TURBT);  Surgeon: Nickie Retort, MD;  Location: [facility];  Service: Urology;  Laterality: N/A;  . TRANSURETHRAL RESECTION OF BLADDER TUMOR N/A 09/04/2016   Procedure: TRANSURETHRAL RESECTION OF BLADDER TUMOR (TURBT);  Surgeon: Nickie Retort, MD;  Location: [facility];  Service: Urology;  Laterality: N/A;    There were no vitals filed for this visit.   Subjective Assessment - 02/11/20 0843    Subjective pt denies any falls or stumbles but does complain of some LBP " relatively new"    Currently in Pain? Yes    Pain Score 5     Pain Location Back    Pain Orientation Lower                             [facility] Adult PT Treatment/Exercise - 02/11/20 0001      Knee/Hip Exercises: Stretches   Active Hamstring Stretch Both;2 reps;10 seconds   educ to do at home   Passive Hamstring Stretch Both;3 reps;10 seconds    Other Knee/Hip Stretches IT stretch      Knee/Hip Exercises: Aerobic   Nustep L 5 6 min      Knee/Hip  Exercises: Standing   Walking with Sports Cord 30# 5 x fwd, 3 x each side- CGA    Other Standing Knee Exercises Alt taps with 6 inch box 2 sets 20 min A   difficult for pt     Knee/Hip Exercises: Seated   Sit to Sand 10 reps;without UE support   5# chest press     Knee/Hip Exercises: Supine   Bridges Strengthening;10 reps   KTC and obl. isometric abs      Modalities   Modalities Electrical Stimulation;Moist Heat      Moist Heat Therapy   Number Minutes Moist Heat 15 Minutes    Moist Heat Location Lumbar Spine      Electrical Stimulation   Electrical Stimulation Location LB    Electrical Stimulation Action IFC  Electrical Stimulation Parameters supine    Electrical Stimulation Goals Pain                    PT Short Term Goals - 02/11/20 0920      PT SHORT TERM GOAL #1   Title Pt will be I and compliant with initial HEP.    Status Achieved             PT Long Term Goals - 02/11/20 0920      PT LONG TERM GOAL #1   Title Pt will be I and compliant with HEP.    Status On-going      PT LONG TERM GOAL #2   Title Pt will improv BERG balance score >50 to show improved balance    Status On-going      PT LONG TERM GOAL #3   Title decrease TUG time to 12 seconds    Baseline limited with increased LBP and spasm with standing    Status On-going      PT LONG TERM GOAL #4   Title increase knee and ankle strength to 4+/5    Status On-going                 Plan - 02/11/20 0921    Clinical Impression Statement slow goal progress d/t LBP, pain and spasms with STS and bed mobility, added some core ex and stretching. educ in HS stretch for home as he is very tight RT>Left. CGA with resisted gat and min A with multi LOB with alt step tap    PT Treatment/Interventions ADLs/Self Care Home Management;Stair training;Functional mobility training;Therapeutic activities;Gait training;Neuromuscular re-education;Balance training;Therapeutic exercise;Patient/family  education    PT Next Visit Plan work on balance, assess if estim helped back pain           Patient will benefit from skilled therapeutic intervention in order to improve the following deficits and impairments:  Abnormal gait, Decreased range of motion, Difficulty walking, Decreased balance, Decreased strength  Visit Diagnosis: Difficulty in walking, not elsewhere classified  Other abnormalities of gait and mobility     Problem List Patient Active Problem List   Diagnosis Date Noted  . History of mitral valve repair 11/26/2018  . Prosthetic valve dysfunction 11/25/2018  . Ectatic abdominal aorta ([place]) 11/25/2018  . 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 History of bladder cancer   . History of pulmonary embolus (PE)   . Acute on chronic diastolic heart failure ([place]) 04/23/2018  . History of aortic valve replacement 04/23/2018  . Atrial fibrillation ([place]) 04/23/2018  . Coronary atherosclerosis of native coronary artery 02/25/2014  . Hyperlipidemia 02/25/2014  . Embolism and thrombosis ([place]) 03/11/2013    Callahan Peddie,ANGIE  PTA 02/11/2020, 9:23 AM  [facility] [place]. [place], [name], [address] Phone: [phone]   Fax:  [phone]  Name: Eric [name] MRN: [id number] Date of Birth: 22-Feb-1930

## 2020-02-16 ENCOUNTER — Ambulatory Visit: Payer: Medicare Other | Admitting: Physical Therapy

## 2020-02-18 ENCOUNTER — Ambulatory Visit: Payer: Medicare Other | Admitting: Physical Therapy

## 2020-02-19 ENCOUNTER — Encounter (HOSPITAL_COMMUNITY): Payer: Medicare Other

## 2020-02-22 ENCOUNTER — Encounter (HOSPITAL_COMMUNITY): Payer: Medicare Other

## 2020-02-23 ENCOUNTER — Ambulatory Visit: Payer: Medicare Other | Admitting: Physical Therapy

## 2020-02-24 ENCOUNTER — Encounter (HOSPITAL_COMMUNITY): Payer: Medicare Other

## 2020-02-25 ENCOUNTER — Encounter: Payer: Self-pay | Admitting: Physical Therapy

## 2020-02-25 ENCOUNTER — Encounter: Payer: Medicare Other | Admitting: Physical Therapy

## 2020-02-25 ENCOUNTER — Ambulatory Visit: Payer: Medicare Other | Attending: Physician Assistant | Admitting: Physical Therapy

## 2020-02-25 ENCOUNTER — Other Ambulatory Visit: Payer: Self-pay

## 2020-02-25 DIAGNOSIS — R2689 Other abnormalities of gait and mobility: Secondary | ICD-10-CM | POA: Diagnosis present

## 2020-02-25 DIAGNOSIS — R262 Difficulty in walking, not elsewhere classified: Secondary | ICD-10-CM | POA: Diagnosis present

## 2020-02-25 NOTE — Therapy (Signed)
[place] [place], [name], [address] Phone: [phone]   Fax:  [phone]  Physical Therapy Treatment  Patient Details  Name: Eric [name] MRN: [id number] Date of Birth: 03/25/1930 Referring Provider (PT): Clelland   Encounter Date: 02/25/2020   PT End of Session - 02/25/20 1555    Visit Number 5    Date for PT Re-Evaluation 03/28/20    PT Start Time 3557    PT Stop Time 1600    PT Time Calculation (min) 45 min    Activity Tolerance Patient tolerated treatment well    Behavior During Therapy [facility] for tasks assessed/performed           Past Medical History:  Diagnosis Date  . Atrial flutter, paroxysmal ([place])    a. dx 04-02-2014, s/p  successful cardioversion 04-06-2014.  Marland Kitchen Chronic anticoagulation    on Eliquis--  due to recurrent dvt's and pe's  . Dyslipidemia   . History of bladder cancer urologist-  dr [name]   02-13-2016  s/p TURBT per path high grade papillary urothelial carcinoma  . History of DVT (deep vein thrombosis)    2000-- RLE  . History of melanoma excision    left flank;  07/ 2014 left lower leg and right upper chest  . History of prostate cancer    Gleason 6--  s/p  radical prostatectomy 06/ 2000 in [place]---  no recurrence  . History of pulmonary embolus (PE)    2000 & 2005  bilateral  . Hx of valvuloplasty 06/19/2007   a. s/p mitral ring annuloplasty, repair ruptured chordae of P2 flail segments of MV  . S/P aortic valve replacement with bioprosthetic valve    06-19-2007  severe aortic valve stenosis  . 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 Single vessel coronary artery disease   cardiologist-  dr Irish Lack   a. 05/2007 CABG x 1: s/p SVG-OM;  b. 10/2010 Ex MV: EF 72%, inf attenuation w/o ischemia, brief run of PAT with exercise. To  . Thrombocytopenia ([place])   . Thyroid goiter 2013   nodular  . Wears hearing aid    BILATERAL  . Wears partial dentures    UPPER    Past  Surgical History:  Procedure Laterality Date  . AORTIC VALVE REPLACEMENT (AVR)/CORONARY ARTERY BYPASS GRAFTING (CABG)  06-19-2007  dr [name]   SVG to OM1 ;   AVR w/ #23 Mitralflow pericardial and ligation left atrial appendage;  MV repair w/ #28 Sorin 3-D memo Ring Annuloplasty with repair ruptured chordar of P-2 flail segments  . CARDIAC CATHETERIZATION  05-12-2007  dr Leonia Reeves   single vessel 30% ostial LAD,  80% LCx;  severe to critial AS,  moderate MR,  normal LVF, ef 70%,  normal right heart pressures and cardiac outputs,  mild elevated LV end-diastolic pressure    . CARDIOVASCULAR STRESS TEST  11-02-2010  dr Irish Lack   normal nuclear study w/ no ischemia or infarct/scar/  normal LV function and wall motion , ef 72%  . CARDIOVERSION N/A 04/06/2014   Procedure: CARDIOVERSION;  Surgeon: [name], MD;  Location: [place];  Service: Cardiovascular;  Laterality: N/A;   successful  . CATARACT EXTRACTION W/ INTRAOCULAR LENS  IMPLANT, BILATERAL  2012  approx  . CORONARY ARTERY BYPASS GRAFT    . RETROPUBIC RADICAL PROSTATECTOMY  06/ 2000  in [place], [place]  . RIGHT/LEFT HEART CATH AND CORONARY/GRAFT ANGIOGRAPHY N/A 10/29/2018   Procedure: RIGHT/LEFT HEART CATH AND CORONARY/GRAFT  ANGIOGRAPHY;  Surgeon: [name], MD;  Location: [place] CV [facility];  Service: Cardiovascular;  Laterality: N/A;  . ROTATOR CUFF REPAIR Left 09/1998  . TEE WITHOUT CARDIOVERSION N/A 04/06/2014   Procedure: TRANSESOPHAGEAL ECHOCARDIOGRAM (TEE);  Surgeon: [name], MD;  Location: [facility] ENDOSCOPY;  Service: Cardiovascular;  Laterality: N/A;  mild focal basa LVH of the septum, ef 50-55%, s/p AV annuloplasty ring, elongated chordae, thickened leaflets, mild to mod. MR, multiple small jets/arotic bioprosthetic valve sits well in position, mild AI/ thickened TV w/ mild reurg./mild LA  . TEE WITHOUT CARDIOVERSION N/A 10/22/2018   Procedure: TRANSESOPHAGEAL ECHOCARDIOGRAM (TEE);  Surgeon: [name], MD;   Location: [facility] ENDOSCOPY;  Service: Cardiovascular;  Laterality: N/A;  . TEE WITHOUT CARDIOVERSION N/A 11/25/2018   Procedure: TRANSESOPHAGEAL ECHOCARDIOGRAM (TEE);  Surgeon: [name], MD;  Location: [place];  Service: Open Heart Surgery;  Laterality: N/A;  . TRANSCATHETER AORTIC VALVE REPLACEMENT, TRANSFEMORAL N/A 11/25/2018   Procedure: TRANSCATHETER AORTIC VALVE REPLACEMENT, TRANSFEMORAL;  Surgeon: [name], MD;  Location: [place];  Service: Open Heart Surgery;  Laterality: N/A;  . TRANSTHORACIC ECHOCARDIOGRAM  03-23-2016   dr Irish Lack   EF 55-60%, reduced contribution atrial contraction to ventricular filling, due to increased ventricular diastolic pressure or atrial contratile dysfunction/  bioprosthetic AV w/ mod. regurg. (valve area 1.97cm^2)/ mild dilated ascending aorta/ mild thicken MV normal function annular ring prosthesis/severe LAE & mod. to sev.RAE/ PASP 41mmHg/ mild TR/ ventricle septal motion show paradox  . TRANSURETHRAL RESECTION OF BLADDER TUMOR N/A 02/13/2016   Procedure: TRANSURETHRAL RESECTION OF BLADDER TUMOR (TURBT);  Surgeon: Nickie Retort, MD;  Location: [facility];  Service: Urology;  Laterality: N/A;  . TRANSURETHRAL RESECTION OF BLADDER TUMOR N/A 09/04/2016   Procedure: TRANSURETHRAL RESECTION OF BLADDER TUMOR (TURBT);  Surgeon: Nickie Retort, MD;  Location: [facility];  Service: Urology;  Laterality: N/A;    There were no vitals filed for this visit.   Subjective Assessment - 02/25/20 1514    Subjective Feeling ok today    Currently in Pain? Yes    Pain Score 1     Pain Location Back                             [facility] Adult PT Treatment/Exercise - 02/25/20 0001      High Level Balance   High Level Balance Comments Forward and side step over objects, Ball toss on solid and non compliant surface. Side steps on and off airex, On airex with 6 in alt taps.       Knee/Hip Exercises: Aerobic   Nustep L 5  6 min      Knee/Hip Exercises: Machines for Strengthening   Cybex Knee Extension 5# 2x10    Cybex Knee Flexion 25# 2x10      Knee/Hip Exercises: Standing   Walking with Sports Cord 30lb 4 way x 3 each                    PT Short Term Goals - 02/11/20 0920      PT SHORT TERM GOAL #1   Title Pt will be I and compliant with initial HEP.    Status Achieved             PT Long Term Goals - 02/25/20 1557      PT LONG TERM GOAL #1   Title Pt will be I and compliant with  HEP.    Status Achieved      PT LONG TERM GOAL #2   Title Pt will improv BERG balance score >50 to show improved balance    Status On-going                 Plan - 02/25/20 1555    Clinical Impression Statement Pt returns to therapy after two week cause his wife tested positive for covid 19. No reports of increase pain. Some instability noted throughout session. Cues needed not to drag LE with resisted gait. Some clearance issues when stepping over objects. Some LOW posteriorly with standing ball toss. SBA needed with most balance interventions.    Stability/Clinical Decision Making Stable/Uncomplicated    Rehab Potential Good    PT Treatment/Interventions ADLs/Self Care Home Management;Stair training;Functional mobility training;Therapeutic activities;Gait training;Neuromuscular re-education;Balance training;Therapeutic exercise;Patient/family education    PT Next Visit Plan work on balance, assess if estim helped back pain           Patient will benefit from skilled therapeutic intervention in order to improve the following deficits and impairments:  Abnormal gait, Decreased range of motion, Difficulty walking, Decreased balance, Decreased strength  Visit Diagnosis: Other abnormalities of gait and mobility  Difficulty in walking, not elsewhere classified     Problem List Patient Active Problem List   Diagnosis Date Noted  . History of mitral valve repair 11/26/2018  . Prosthetic  valve dysfunction 11/25/2018  . Ectatic abdominal aorta ([place]) 11/25/2018  . 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 History of bladder cancer   . History of pulmonary embolus (PE)   . Acute on chronic diastolic heart failure ([place]) 04/23/2018  . History of aortic valve replacement 04/23/2018  . Atrial fibrillation ([place]) 04/23/2018  . Coronary atherosclerosis of native coronary artery 02/25/2014  . Hyperlipidemia 02/25/2014  . Embolism and thrombosis ([place]) 03/11/2013    Scot Jun, PTA 02/25/2020, 3:58 PM  [address] [address] [place], [name], [address] Phone: [phone]   Fax:  [phone]  Name: Eric [name] MRN: [id number] Date of Birth: Feb 10, 1930

## 2020-03-01 ENCOUNTER — Ambulatory Visit: Payer: Medicare Other | Admitting: Physical Therapy

## 2020-03-01 ENCOUNTER — Other Ambulatory Visit: Payer: Self-pay

## 2020-03-01 ENCOUNTER — Encounter: Payer: Self-pay | Admitting: Physical Therapy

## 2020-03-01 DIAGNOSIS — R262 Difficulty in walking, not elsewhere classified: Secondary | ICD-10-CM

## 2020-03-01 DIAGNOSIS — R2689 Other abnormalities of gait and mobility: Secondary | ICD-10-CM | POA: Diagnosis not present

## 2020-03-01 NOTE — Therapy (Signed)
[place]. [place], [name], [address] Phone: [phone]   Fax:  [phone]  Physical Therapy Treatment  Patient Details  Name: Eric [name] MRN: [id number] Date of Birth: [birth year] Referring Provider (PT): Clelland   Encounter Date: 03/01/2020   PT End of Session - 03/01/20 0840    Visit Number 6    Date for PT Re-Evaluation 03/28/20    PT Start Time 0800    PT Stop Time 0843    PT Time Calculation (min) 43 min    Activity Tolerance Patient tolerated treatment well    Behavior During Therapy Eric [name] for tasks assessed/performed           Past Medical History:  Diagnosis Date  . Atrial flutter, paroxysmal ([place])    a. dx 04-02-2014, s/p  successful cardioversion 04-06-2014.  Marland Kitchen Chronic anticoagulation    on Eliquis--  due to recurrent dvt's and pe's  . Dyslipidemia   . History of bladder cancer urologist-  dr [name]   02-13-2016  s/p TURBT per path high grade papillary urothelial carcinoma  . History of DVT (deep vein thrombosis)    2000-- RLE  . History of melanoma excision    left flank;  07/ 2014 left lower leg and right upper chest  . History of prostate cancer    Gleason 6--  s/p  radical prostatectomy 06/ 2000 in [place]---  no recurrence  . History of pulmonary embolus (PE)    2000 & 2005  bilateral  . Hx of valvuloplasty 06/19/2007   a. s/p mitral ring annuloplasty, repair ruptured chordae of P2 flail segments of MV  . S/P aortic valve replacement with bioprosthetic valve    06-19-2007  severe aortic valve stenosis  . 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 Single vessel coronary artery disease   cardiologist-  dr Irish Lack   a. 05/2007 CABG x 1: s/p SVG-OM;  b. 10/2010 Ex MV: EF 72%, inf attenuation w/o ischemia, brief run of PAT with exercise. To  . Thrombocytopenia ([place])   . Thyroid goiter 2013   nodular  . Wears hearing aid    BILATERAL  . Wears partial dentures    UPPER    Past  Surgical History:  Procedure Laterality Date  . AORTIC VALVE REPLACEMENT (AVR)/CORONARY ARTERY BYPASS GRAFTING (CABG)  06-19-2007  dr [name]   SVG to OM1 ;   AVR w/ #23 Mitralflow pericardial and ligation left atrial appendage;  MV repair w/ #28 Sorin 3-D memo Ring Annuloplasty with repair ruptured chordar of P-2 flail segments  . CARDIAC CATHETERIZATION  05-12-2007  dr Leonia Reeves   single vessel 30% ostial LAD,  80% LCx;  severe to critial AS,  moderate MR,  normal LVF, ef 70%,  normal right heart pressures and cardiac outputs,  mild elevated LV end-diastolic pressure    . CARDIOVASCULAR STRESS TEST  11-02-2010  dr Irish Lack   normal nuclear study w/ no ischemia or infarct/scar/  normal LV function and wall motion , ef 72%  . CARDIOVERSION N/A 04/06/2014   Procedure: CARDIOVERSION;  Surgeon: [name], MD;  Location: [facility];  Service: Cardiovascular;  Laterality: N/A;   successful  . CATARACT EXTRACTION W/ INTRAOCULAR LENS  IMPLANT, BILATERAL  2012  approx  . CORONARY ARTERY BYPASS GRAFT    . RETROPUBIC RADICAL PROSTATECTOMY  06/ 2000  in [place]  . RIGHT/LEFT HEART CATH AND CORONARY/GRAFT ANGIOGRAPHY N/A 10/29/2018   Procedure: RIGHT/LEFT HEART CATH AND CORONARY/GRAFT  ANGIOGRAPHY;  Surgeon: [name], MD;  Location: [place] [facility];  Service: Cardiovascular;  Laterality: N/A;  . ROTATOR CUFF REPAIR Left 09/1998  . TEE WITHOUT CARDIOVERSION N/A 04/06/2014   Procedure: TRANSESOPHAGEAL ECHOCARDIOGRAM (TEE);  Surgeon: [name], MD;  Location: [name] ENDOSCOPY;  Service: Cardiovascular;  Laterality: N/A;  mild focal basa LVH of the septum, ef 50-55%, s/p AV annuloplasty ring, elongated chordae, thickened leaflets, mild to mod. MR, multiple small jets/arotic bioprosthetic valve sits well in position, mild AI/ thickened TV w/ mild reurg./mild LA  . TEE WITHOUT CARDIOVERSION N/A 10/22/2018   Procedure: TRANSESOPHAGEAL ECHOCARDIOGRAM (TEE);  Surgeon: [name], MD;   Location: [facility] ENDOSCOPY;  Service: Cardiovascular;  Laterality: N/A;  . TEE WITHOUT CARDIOVERSION N/A 11/25/2018   Procedure: TRANSESOPHAGEAL ECHOCARDIOGRAM (TEE);  Surgeon: [name], MD;  Location: [place];  Service: Open Heart Surgery;  Laterality: N/A;  . TRANSCATHETER AORTIC VALVE REPLACEMENT, TRANSFEMORAL N/A 11/25/2018   Procedure: TRANSCATHETER AORTIC VALVE REPLACEMENT, TRANSFEMORAL;  Surgeon: [name], MD;  Location: [place];  Service: Open Heart Surgery;  Laterality: N/A;  . TRANSTHORACIC ECHOCARDIOGRAM  03-23-2016   dr Irish Lack   EF 55-60%, reduced contribution atrial contraction to ventricular filling, due to increased ventricular diastolic pressure or atrial contratile dysfunction/  bioprosthetic AV w/ mod. regurg. (valve area 1.97cm^2)/ mild dilated ascending aorta/ mild thicken MV normal function annular ring prosthesis/severe LAE & mod. to sev.RAE/ PASP 2mmHg/ mild TR/ ventricle septal motion show paradox  . TRANSURETHRAL RESECTION OF BLADDER TUMOR N/A 02/13/2016   Procedure: TRANSURETHRAL RESECTION OF BLADDER TUMOR (TURBT);  Surgeon: Nickie Retort, MD;  Location: [facility];  Service: Urology;  Laterality: N/A;  . TRANSURETHRAL RESECTION OF BLADDER TUMOR N/A 09/04/2016   Procedure: TRANSURETHRAL RESECTION OF BLADDER TUMOR (TURBT);  Surgeon: Nickie Retort, MD;  Location: [facility];  Service: Urology;  Laterality: N/A;    There were no vitals filed for this visit.   Subjective Assessment - 03/01/20 0805    Subjective "Doing ok" no falls or stumbles    Currently in Pain? No/denies                             [name] Adult PT Treatment/Exercise - 03/01/20 0001      High Level Balance   High Level Balance Comments Forward and side step over objects, Ball toss on solid and non compliant surface. Side steps on and off airex, On airex with 6 in alt taps.    multiple bouts of LOB  with non compliant surfaces     Knee/Hip  Exercises: Aerobic   Nustep L 5 6 min      Knee/Hip Exercises: Machines for Strengthening   Cybex Knee Extension 10lb 2x10     Cybex Knee Flexion 25# 2x10      Knee/Hip Exercises: Standing   Walking with Sports Cord 30lb 4 way x 3 each      Knee/Hip Exercises: Seated   Sit to Sand 2 sets;10 reps;without UE support   LE on airex                    PT Short Term Goals - 02/11/20 0920      PT SHORT TERM GOAL #1   Title Pt will be I and compliant with initial HEP.    Status Achieved             PT Long Term  Goals - 02/25/20 1557      PT LONG TERM GOAL #1   Title Pt will be I and compliant with HEP.    Status Achieved      PT LONG TERM GOAL #2   Title Pt will improv BERG balance score >50 to show improved balance    Status On-going                 Plan - 03/01/20 0840    Clinical Impression Statement Pt able to complete all of today's interventions. Cues needed not to drag RLE with resisted gait. He had a difficult time with balance on non compliant surface requiring min assist to correct. Some LOB when stepping over objects but able to correct on his own.    Stability/Clinical Decision Making Stable/Uncomplicated    Rehab Potential Good    PT Frequency 2x / week    PT Duration 8 weeks    PT Treatment/Interventions ADLs/Self Care Home Management;Stair training;Functional mobility training;Therapeutic activities;Gait training;Neuromuscular re-education;Balance training;Therapeutic exercise;Patient/family education    PT Next Visit Plan work on balanc           Patient will benefit from skilled therapeutic intervention in order to improve the following deficits and impairments:  Abnormal gait, Decreased range of motion, Difficulty walking, Decreased balance, Decreased strength  Visit Diagnosis: Difficulty in walking, not elsewhere classified     Problem List Patient Active Problem List   Diagnosis Date Noted  . History of mitral valve repair  11/26/2018  . Prosthetic valve dysfunction 11/25/2018  . Ectatic abdominal aorta ([place]) 11/25/2018  . 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 History of bladder cancer   . History of pulmonary embolus ([place])   . Acute on chronic diastolic heart failure ([place]) 04/23/2018  . History of aortic valve replacement 04/23/2018  . Atrial fibrillation ([place]) 04/23/2018  . Coronary atherosclerosis of native coronary artery 02/25/2014  . Hyperlipidemia 02/25/2014  . Embolism and thrombosis ([place]) 03/11/2013    Eric [name], Eric [name] 03/01/2020, 8:43 AM  [place]. [place], [name], [address] Phone: [phone]   Fax:  [phone]  Name: Eric [name] MRN: [id number] Date of Birth: 1929-12-07

## 2020-03-02 ENCOUNTER — Telehealth: Payer: Self-pay | Admitting: *Deleted

## 2020-03-02 ENCOUNTER — Other Ambulatory Visit: Payer: Self-pay | Admitting: Interventional Cardiology

## 2020-03-02 NOTE — Telephone Encounter (Signed)
Prescription refill sent this morning

## 2020-03-02 NOTE — Telephone Encounter (Signed)
Prescription refill request for Eliquis received.  Last office visit: [place] Silos 11/25/2019 Scr: 1.00, 11/25/2019 Age: 86 yo Weight: 85.6 kg   Prescription refill sent.

## 2020-03-03 ENCOUNTER — Encounter: Payer: Self-pay | Admitting: Physical Therapy

## 2020-03-03 ENCOUNTER — Other Ambulatory Visit: Payer: Self-pay

## 2020-03-03 ENCOUNTER — Ambulatory Visit: Payer: Medicare Other | Admitting: Physical Therapy

## 2020-03-03 DIAGNOSIS — R2689 Other abnormalities of gait and mobility: Secondary | ICD-10-CM

## 2020-03-03 DIAGNOSIS — R262 Difficulty in walking, not elsewhere classified: Secondary | ICD-10-CM

## 2020-03-03 NOTE — Therapy (Signed)
[address] [place], [name], [address] Phone: [phone]   Fax:  [phone]  Physical Therapy Treatment  Patient Details  Name: Eric [name] MRN: [id number] Date of Birth: 1929/08/28 Referring Provider (PT): [place]   Encounter Date: 03/03/2020   PT End of Session - 03/03/20 1351    Visit Number 7    Date for PT Re-Evaluation 03/28/20    PT Start Time 1310    PT Stop Time 1354    PT Time Calculation (min) 44 min    Activity Tolerance Patient tolerated treatment well    Behavior During Therapy [facility] for tasks assessed/performed           Past Medical History:  Diagnosis Date  . Atrial flutter, paroxysmal ([place])    a. dx 04-02-2014, s/p  successful cardioversion 04-06-2014.  Marland Kitchen Chronic anticoagulation    on Eliquis--  due to recurrent dvt's and pe's  . Dyslipidemia   . History of bladder cancer urologist-  dr [name]   02-13-2016  s/p TURBT per path high grade papillary urothelial carcinoma  . History of DVT (deep vein thrombosis)    2000-- RLE  . History of melanoma excision    left flank;  07/ 2014 left lower leg and right upper chest  . History of prostate cancer    Gleason 6--  s/p  radical prostatectomy 06/ 2000 in [place]---  no recurrence  . History of pulmonary embolus (PE)    2000 & 2005  bilateral  . Hx of valvuloplasty 06/19/2007   a. s/p mitral ring annuloplasty, repair ruptured chordae of P2 flail segments of MV  . S/P aortic valve replacement with bioprosthetic valve    06-19-2007  severe aortic valve stenosis  . 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 Single vessel coronary artery disease   cardiologist-  dr Irish Lack   a. 05/2007 CABG x 1: s/p SVG-OM;  b. 10/2010 Ex MV: EF 72%, inf attenuation w/o ischemia, brief run of PAT with exercise. To  . Thrombocytopenia ([place])   . Thyroid goiter 2013   nodular  . Wears hearing aid    BILATERAL  . Wears partial dentures    UPPER    Past  Surgical History:  Procedure Laterality Date  . AORTIC VALVE REPLACEMENT (AVR)/CORONARY ARTERY BYPASS GRAFTING (CABG)  06-19-2007  dr [name]   SVG to OM1 ;   AVR w/ #23 Mitralflow pericardial and ligation left atrial appendage;  MV repair w/ #28 Sorin 3-D memo Ring Annuloplasty with repair ruptured chordar of P-2 flail segments  . CARDIAC CATHETERIZATION  05-12-2007  dr Leonia Reeves   single vessel 30% ostial LAD,  80% LCx;  severe to critial AS,  moderate MR,  normal LVF, ef 70%,  normal right heart pressures and cardiac outputs,  mild elevated LV end-diastolic pressure    . CARDIOVASCULAR STRESS TEST  11-02-2010  dr Irish Lack   normal nuclear study w/ no ischemia or infarct/scar/  normal LV function and wall motion , ef 72%  . CARDIOVERSION N/A 04/06/2014   Procedure: CARDIOVERSION;  Surgeon: [name], MD;  Location: [place];  Service: Cardiovascular;  Laterality: N/A;   successful  . CATARACT EXTRACTION W/ INTRAOCULAR LENS  IMPLANT, BILATERAL  2012  approx  . CORONARY ARTERY BYPASS GRAFT    . RETROPUBIC RADICAL PROSTATECTOMY  06/ 2000  in [place], [place]  . RIGHT/LEFT HEART CATH AND CORONARY/GRAFT ANGIOGRAPHY N/A 10/29/2018   Procedure: RIGHT/LEFT HEART CATH AND CORONARY/GRAFT  ANGIOGRAPHY;  Surgeon: [name], MD;  Location: [place] CV LAB;  Service: Cardiovascular;  Laterality: N/A;  . ROTATOR CUFF REPAIR Left 09/1998  . TEE WITHOUT CARDIOVERSION N/A 04/06/2014   Procedure: TRANSESOPHAGEAL ECHOCARDIOGRAM (TEE);  Surgeon: [name], MD;  Location: [facility] ENDOSCOPY;  Service: Cardiovascular;  Laterality: N/A;  mild focal basa LVH of the septum, ef 50-55%, s/p AV annuloplasty ring, elongated chordae, thickened leaflets, mild to mod. MR, multiple small jets/arotic bioprosthetic valve sits well in position, mild AI/ thickened TV w/ mild reurg./mild LA  . TEE WITHOUT CARDIOVERSION N/A 10/22/2018   Procedure: TRANSESOPHAGEAL ECHOCARDIOGRAM (TEE);  Surgeon: [name], MD;   Location: [facility] ENDOSCOPY;  Service: Cardiovascular;  Laterality: N/A;  . TEE WITHOUT CARDIOVERSION N/A 11/25/2018   Procedure: TRANSESOPHAGEAL ECHOCARDIOGRAM (TEE);  Surgeon: [name], MD;  Location: [place];  Service: Open Heart Surgery;  Laterality: N/A;  . TRANSCATHETER AORTIC VALVE REPLACEMENT, TRANSFEMORAL N/A 11/25/2018   Procedure: TRANSCATHETER AORTIC VALVE REPLACEMENT, TRANSFEMORAL;  Surgeon: [name], MD;  Location: [place];  Service: Open Heart Surgery;  Laterality: N/A;  . TRANSTHORACIC ECHOCARDIOGRAM  03-23-2016   dr Irish Lack   EF 55-60%, reduced contribution atrial contraction to ventricular filling, due to increased ventricular diastolic pressure or atrial contratile dysfunction/  bioprosthetic AV w/ mod. regurg. (valve area 1.97cm^2)/ mild dilated ascending aorta/ mild thicken MV normal function annular ring prosthesis/severe LAE & mod. to sev.RAE/ PASP 79mHg/ mild TR/ ventricle septal motion show paradox  . TRANSURETHRAL RESECTION OF BLADDER TUMOR N/A 02/13/2016   Procedure: TRANSURETHRAL RESECTION OF BLADDER TUMOR (TURBT);  Surgeon: [name] MD;  [facility]  Service: Urology;  Laterality: N/A;  . TRANSURETHRAL RESECTION OF BLADDER TUMOR N/A 09/04/2016   Procedure: TRANSURETHRAL RESECTION OF BLADDER TUMOR (TURBT);  Surgeon: [name] MD;  [facility]  Service: Urology;  Laterality: N/A;    There were no vitals filed for this visit.   Subjective Assessment - 03/03/20 1323    Subjective Patient reports that he was surprised that I did so bad with the balance    Currently in Pain? No/denies                             [facility] - [facility]20 0001      Ambulation/Gait   Gait Comments gait outside in grass, holes, tree roots, negotiating curbs      High Level Balance   High Level Balance Activities Tandem walking;Negotiating over obstacles    High Level Balance  Comments standing soft toe touch on roll, on airex ball toss, head turns and eyes closed, toe taps on rocker board, walking and ball toss      Knee/Hip Exercises: Aerobic   Nustep L 5 6 min      Knee/Hip Exercises: Machines for Strengthening   Cybex Knee Extension 10lb 3x10     Cybex Knee Flexion 25# 3x10                    PT Short Term Goals - 02/11/20 0920      PT SHORT TERM GOAL #1   Title Pt will be I and compliant with initial HEP.    Status Achieved             PT Long Term Goals - 03/03/20 1356      PT LONG TERM GOAL #3   Baseline limited with  increased LBP and spasm with standing    Status Not Met      PT LONG TERM GOAL #4   Title increase knee and ankle strength to 4+/5    Status Partially Met                 Plan - 03/03/20 1352    Clinical Impression Statement Patient really does have a hard time with the balance activities, he almost seems like he has neuropathy as he does not control the feet very well and at times he has a hard foot hit at times, he denies neuropathy or numbness.  The airex standing was the most difficult for him, he did have some difficulty walking in the grass    PT Next Visit Plan continue to work on his balance    Consulted and Agree with Plan of Care Patient           Patient will benefit from skilled therapeutic intervention in order to improve the following deficits and impairments:  Abnormal gait, Decreased range of motion, Difficulty walking, Decreased balance, Decreased strength  Visit Diagnosis: Difficulty in walking, not elsewhere classified  Other abnormalities of gait and mobility     Problem List Patient Active Problem List   Diagnosis Date Noted  . History of mitral valve repair 11/26/2018  . Prosthetic valve dysfunction 11/25/2018  . Ectatic abdominal aorta ([place]) 11/25/2018  . 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 History of bladder cancer   . History of pulmonary embolus (PE)   . Acute on  chronic diastolic heart failure ([place]) 04/23/2018  . History of aortic valve replacement 04/23/2018  . Atrial fibrillation ([place]) 04/23/2018  . Coronary atherosclerosis of native coronary artery 02/25/2014  . Hyperlipidemia 02/25/2014  . Embolism and thrombosis ([place]) 03/11/2013    Sumner Boast., PT 03/03/2020, 1:57 PM  [address] [place], [name], [address] Phone: [phone]   Fax:  [phone]  Name: Eric [name] MRN: [id number] Date of Birth: 1930-01-02

## 2020-03-08 ENCOUNTER — Other Ambulatory Visit: Payer: Self-pay

## 2020-03-08 ENCOUNTER — Other Ambulatory Visit: Payer: Self-pay | Admitting: Interventional Cardiology

## 2020-03-08 ENCOUNTER — Encounter: Payer: Self-pay | Admitting: Physical Therapy

## 2020-03-08 ENCOUNTER — Ambulatory Visit: Payer: Medicare Other | Admitting: Physical Therapy

## 2020-03-08 DIAGNOSIS — R2689 Other abnormalities of gait and mobility: Secondary | ICD-10-CM

## 2020-03-08 DIAGNOSIS — R262 Difficulty in walking, not elsewhere classified: Secondary | ICD-10-CM

## 2020-03-08 NOTE — Therapy (Signed)
[address] [place], [name], [address] Phone: [phone]   Fax:  [phone]  Physical Therapy Treatment  Patient Details  Name: Eric [name] MRN: [id number] Date of Birth: 03-Aug-1929 Referring Provider (PT): [place]   Encounter Date: 03/08/2020   PT End of Session - 03/08/20 0843    Visit Number 8    Date for PT Re-Evaluation 03/28/20    PT Start Time 0800    PT Stop Time 2637    PT Time Calculation (min) 41 min    Activity Tolerance Patient tolerated treatment well    Behavior During Therapy [facility] for tasks assessed/performed           Past Medical History:  Diagnosis Date   Atrial flutter, paroxysmal ([place])    a. dx 04-02-2014, s/p  successful cardioversion 04-06-2014.   Chronic anticoagulation    on Eliquis--  due to recurrent dvt's and pe's   Dyslipidemia    History of bladder cancer urologist-  dr [name]   02-13-2016  s/p TURBT per path high grade papillary urothelial carcinoma   History of DVT (deep vein thrombosis)    2000-- RLE   History of melanoma excision    left flank;  07/ 2014 left lower leg and right upper chest   History of prostate cancer    Gleason 6--  s/p  radical prostatectomy 06/ 2000 in [place]---  no recurrence   History of pulmonary embolus (PE)    2000 & 2005  bilateral   Hx of valvuloplasty 06/19/2007   a. 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 05/2007 CABG x 1: s/p SVG-OM;  b. 10/2010 Ex MV: EF 72%, inf attenuation w/o ischemia, brief run of PAT with exercise. To   Thrombocytopenia ([place])    Thyroid goiter 2013   nodular   Wears hearing aid    BILATERAL   Wears partial dentures    UPPER    Past  Surgical History:  Procedure Laterality Date   AORTIC VALVE REPLACEMENT (AVR)/CORONARY ARTERY BYPASS GRAFTING (CABG)  06-19-2007  dr [name]   SVG to OM1 ;   AVR w/ #23 Mitralflow pericardial and ligation left atrial appendage;  MV repair w/ #28 Sorin 3-D memo Ring Annuloplasty with repair ruptured chordar of P-2 flail segments   CARDIAC CATHETERIZATION  05-12-2007  dr Leonia Reeves   single vessel 30% ostial LAD,  80% LCx;  severe to critial AS,  moderate MR,  normal LVF, ef 70%,  normal right heart pressures and cardiac outputs,  mild elevated LV end-diastolic pressure     CARDIOVASCULAR STRESS TEST  11-02-2010  dr Irish Lack   normal nuclear study w/ no ischemia or infarct/scar/  normal LV function and wall motion , ef 72%   CARDIOVERSION N/A 04/06/2014   Procedure: CARDIOVERSION;  Surgeon: [name], MD;  Location: [place];  Service: Cardiovascular;  Laterality: N/A;   successful   CATARACT EXTRACTION W/ INTRAOCULAR LENS  IMPLANT, BILATERAL  2012  approx   CORONARY ARTERY BYPASS GRAFT     RETROPUBIC RADICAL PROSTATECTOMY  06/ 2000  in [place]   RIGHT/LEFT HEART CATH AND CORONARY/GRAFT ANGIOGRAPHY N/A 10/29/2018   Procedure: RIGHT/LEFT HEART CATH AND CORONARY/GRAFT  ANGIOGRAPHY;  Surgeon: [name], MD;  Location: [place] CV LAB;  Service: Cardiovascular;  Laterality: N/A;   ROTATOR CUFF REPAIR Left 09/1998   TEE WITHOUT CARDIOVERSION N/A 04/06/2014   Procedure: TRANSESOPHAGEAL ECHOCARDIOGRAM (TEE);  Surgeon: [name], MD;  Location: [facility] ENDOSCOPY;  Service: Cardiovascular;  Laterality: N/A;  mild focal basa LVH of the septum, ef 50-55%, s/p AV annuloplasty ring, elongated chordae, thickened leaflets, mild to mod. MR, multiple small jets/arotic bioprosthetic valve sits well in position, mild AI/ thickened TV w/ mild reurg./mild LA   TEE WITHOUT CARDIOVERSION N/A 10/22/2018   Procedure: TRANSESOPHAGEAL ECHOCARDIOGRAM (TEE);  Surgeon: [name], MD;   Location: [facility] ENDOSCOPY;  Service: Cardiovascular;  Laterality: N/A;   TEE WITHOUT CARDIOVERSION N/A 11/25/2018   Procedure: TRANSESOPHAGEAL ECHOCARDIOGRAM (TEE);  Surgeon: [name], MD;  Location: [place];  Service: Open Heart Surgery;  Laterality: N/A;   TRANSCATHETER AORTIC VALVE REPLACEMENT, TRANSFEMORAL N/A 11/25/2018   Procedure: TRANSCATHETER AORTIC VALVE REPLACEMENT, TRANSFEMORAL;  Surgeon: [name], MD;  Location: [place];  Service: Open Heart Surgery;  Laterality: N/A;   TRANSTHORACIC ECHOCARDIOGRAM  03-23-2016   dr Irish Lack   EF 55-60%, reduced contribution atrial contraction to ventricular filling, due to increased ventricular diastolic pressure or atrial contratile dysfunction/  bioprosthetic AV w/ mod. regurg. (valve area 1.97cm^2)/ mild dilated ascending aorta/ mild thicken MV normal function annular ring prosthesis/severe LAE & mod. to sev.RAE/ PASP 21mHg/ mild TR/ ventricle septal motion show paradox   TRANSURETHRAL RESECTION OF BLADDER TUMOR N/A 02/13/2016   Procedure: TRANSURETHRAL RESECTION OF BLADDER TUMOR (TURBT);  Surgeon: [name] MD;  [facility]  Service: Urology;  Laterality: N/A;   TRANSURETHRAL RESECTION OF BLADDER TUMOR N/A 09/04/2016   Procedure: TRANSURETHRAL RESECTION OF BLADDER TUMOR (TURBT);  Surgeon: [name] MD;  [facility]  Service: Urology;  Laterality: N/A;    There were no vitals filed for this visit.   Subjective Assessment - 03/08/20 0758    Subjective I am lucky I have not fallen and have not had many stumbles but am surprised at how difficult the balance stuff here is.    Currently in Pain? No/denies                             [facility] - 03/08/20 0001      High Level Balance   High Level Balance Comments soft balance beam with light hand hold assist, on airex throwing, head turns, eyes closed , 8" toe touches, foot on step  head turns and ball tosses      Knee/Hip Exercises: Aerobic   Nustep L 5 6 min      Knee/Hip Exercises: Machines for Strengthening   Cybex Knee Extension 10lb 3x10     Cybex Knee Flexion 25# 3x10    Cybex Leg Press 20# 2x10      Knee/Hip Exercises: Standing   Walking with Sports Cord resisted gait all directions                  PT Education - 03/08/20 0841    Education Details HEP for balance firm, foam, eyes open, closed, head turns and up and down    Person(s) Educated Patient    Methods Explanation;Demonstration;Verbal cues;Handout    Comprehension Verbalized understanding            PT Short Term Goals - 02/11/20 01937  PT SHORT TERM GOAL #1   Title Pt will be I and compliant with initial HEP.    Status Achieved             PT Long Term Goals - 03/03/20 1356      PT LONG TERM GOAL #3   Baseline limited with increased LBP and spasm with standing    Status Not Met      PT LONG TERM GOAL #4   Title increase knee and ankle strength to 4+/5    Status Partially Met                 Plan - 03/08/20 0900    Clinical Impression Statement Added leg press and a lot more balance activities here and at home.  He has some good reactions, but at times they are late especially a step reaction to not fall.  He again has a hard foot contact when trying to do toe taps.    PT Next Visit Plan continue to work on his balance    Consulted and Agree with Plan of Care Patient           Patient will benefit from skilled therapeutic intervention in order to improve the following deficits and impairments:  Abnormal gait, Decreased range of motion, Difficulty walking, Decreased balance, Decreased strength  Visit Diagnosis: Difficulty in walking, not elsewhere classified  Other abnormalities of gait and mobility     Problem List Patient Active Problem List   Diagnosis Date Noted   History of mitral valve repair 11/26/2018   Prosthetic valve dysfunction  11/25/2018   Ectatic abdominal aorta ([place]) 11/25/2018   S/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 [place], [name], [address] Phone: [phone]   Fax:  [phone]  Name: RAFIQ BUCKLIN MRN: [id number] Date of Birth: Sep 06, 1929

## 2020-03-10 ENCOUNTER — Encounter: Payer: Self-pay | Admitting: Physical Therapy

## 2020-03-10 ENCOUNTER — Ambulatory Visit: Payer: Medicare Other | Admitting: Physical Therapy

## 2020-03-10 ENCOUNTER — Other Ambulatory Visit: Payer: Self-pay

## 2020-03-10 DIAGNOSIS — R2689 Other abnormalities of gait and mobility: Secondary | ICD-10-CM | POA: Diagnosis not present

## 2020-03-10 DIAGNOSIS — R262 Difficulty in walking, not elsewhere classified: Secondary | ICD-10-CM

## 2020-03-10 NOTE — Therapy (Signed)
[place]. [place], [name], [address] Phone: [phone]   Fax:  [phone]  Physical Therapy Treatment  Patient Details  Name: Eric [name] MRN: [id number] Date of Birth: 1929-08-26 Referring Provider (PT): [place]   Encounter Date: 03/10/2020   PT End of Session - 03/10/20 0840    Visit Number 9    Date for PT Re-Evaluation 03/28/20    PT Start Time 0758    PT Stop Time 0841    PT Time Calculation (min) 43 min    Activity Tolerance Patient tolerated treatment well    Behavior During Therapy [facility] for tasks assessed/performed           Past Medical History:  Diagnosis Date  . Atrial flutter, paroxysmal ([place])    a. dx 04-02-2014, s/p  successful cardioversion 04-06-2014.  Marland Kitchen Chronic anticoagulation    on Eliquis--  due to recurrent dvt's and pe's  . Dyslipidemia   . History of bladder cancer urologist-  dr [name]   02-13-2016  s/p TURBT per path high grade papillary urothelial carcinoma  . History of DVT (deep vein thrombosis)    2000-- RLE  . History of melanoma excision    left flank;  07/ 2014 left lower leg and right upper chest  . History of prostate cancer    Gleason 6--  s/p  radical prostatectomy 06/ 2000 in [place]---  no recurrence  . History of pulmonary embolus (PE)    2000 & 2005  bilateral  . Hx of valvuloplasty 06/19/2007   a. s/p mitral ring annuloplasty, repair ruptured chordae of P2 flail segments of MV  . S/P aortic valve replacement with bioprosthetic valve    06-19-2007  severe aortic valve stenosis  . 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 Single vessel coronary artery disease   cardiologist-  dr Irish Lack   a. 05/2007 CABG x 1: s/p SVG-OM;  b. 10/2010 Ex MV: EF 72%, inf attenuation w/o ischemia, brief run of PAT with exercise. To  . Thrombocytopenia ([place])   . Thyroid goiter 2013   nodular  . Wears hearing aid    BILATERAL  . Wears partial dentures    UPPER    Past  Surgical History:  Procedure Laterality Date  . AORTIC VALVE REPLACEMENT (AVR)/CORONARY ARTERY BYPASS GRAFTING (CABG)  06-19-2007  dr [name]   SVG to OM1 ;   AVR w/ #23 Mitralflow pericardial and ligation left atrial appendage;  MV repair w/ #28 Sorin 3-D memo Ring Annuloplasty with repair ruptured chordar of P-2 flail segments  . CARDIAC CATHETERIZATION  05-12-2007  dr Leonia Reeves   single vessel 30% ostial LAD,  80% LCx;  severe to critial AS,  moderate MR,  normal LVF, ef 70%,  normal right heart pressures and cardiac outputs,  mild elevated LV end-diastolic pressure    . CARDIOVASCULAR STRESS TEST  11-02-2010  dr Irish Lack   normal nuclear study w/ no ischemia or infarct/scar/  normal LV function and wall motion , ef 72%  . CARDIOVERSION N/A 04/06/2014   Procedure: CARDIOVERSION;  Surgeon: [name], MD;  Location: [place];  Service: Cardiovascular;  Laterality: N/A;   successful  . CATARACT EXTRACTION W/ INTRAOCULAR LENS  IMPLANT, BILATERAL  2012  approx  . CORONARY ARTERY BYPASS GRAFT    . RETROPUBIC RADICAL PROSTATECTOMY  06/ 2000  in [place], [place]  . RIGHT/LEFT HEART CATH AND CORONARY/GRAFT ANGIOGRAPHY N/A 10/29/2018   Procedure: RIGHT/LEFT HEART CATH AND CORONARY/GRAFT  ANGIOGRAPHY;  Surgeon: [name], MD;  Location: [place] CV LAB;  Service: Cardiovascular;  Laterality: N/A;  . ROTATOR CUFF REPAIR Left 09/1998  . TEE WITHOUT CARDIOVERSION N/A 04/06/2014   Procedure: TRANSESOPHAGEAL ECHOCARDIOGRAM (TEE);  Surgeon: [name], MD;  Location: [facility] ENDOSCOPY;  Service: Cardiovascular;  Laterality: N/A;  mild focal basa LVH of the septum, ef 50-55%, s/p AV annuloplasty ring, elongated chordae, thickened leaflets, mild to mod. MR, multiple small jets/arotic bioprosthetic valve sits well in position, mild AI/ thickened TV w/ mild reurg./mild LA  . TEE WITHOUT CARDIOVERSION N/A 10/22/2018   Procedure: TRANSESOPHAGEAL ECHOCARDIOGRAM (TEE);  Surgeon: [name], MD;   Location: [facility] ENDOSCOPY;  Service: Cardiovascular;  Laterality: N/A;  . TEE WITHOUT CARDIOVERSION N/A 11/25/2018   Procedure: TRANSESOPHAGEAL ECHOCARDIOGRAM (TEE);  Surgeon: [name], MD;  Location: [place];  Service: Open Heart Surgery;  Laterality: N/A;  . TRANSCATHETER AORTIC VALVE REPLACEMENT, TRANSFEMORAL N/A 11/25/2018   Procedure: TRANSCATHETER AORTIC VALVE REPLACEMENT, TRANSFEMORAL;  Surgeon: [name], MD;  Location: [place];  Service: Open Heart Surgery;  Laterality: N/A;  . TRANSTHORACIC ECHOCARDIOGRAM  03-23-2016   dr Irish Lack   EF 55-60%, reduced contribution atrial contraction to ventricular filling, due to increased ventricular diastolic pressure or atrial contratile dysfunction/  bioprosthetic AV w/ mod. regurg. (valve area 1.97cm^2)/ mild dilated ascending aorta/ mild thicken MV normal function annular ring prosthesis/severe LAE & mod. to sev.RAE/ PASP 50mHg/ mild TR/ ventricle septal motion show paradox  . TRANSURETHRAL RESECTION OF BLADDER TUMOR N/A 02/13/2016   Procedure: TRANSURETHRAL RESECTION OF BLADDER TUMOR (TURBT);  Surgeon: [name] MD;  [facility]  Service: Urology;  Laterality: N/A;  . TRANSURETHRAL RESECTION OF BLADDER TUMOR N/A 09/04/2016   Procedure: TRANSURETHRAL RESECTION OF BLADDER TUMOR (TURBT);  Surgeon: [name] MD;  [facility]  Service: Urology;  Laterality: N/A;    There were no vitals filed for this visit.   Subjective Assessment - 03/10/20 0801    Subjective Doing all right no falls    Currently in Pain? No/denies                             [facility] Treatment/Exercise - 03/10/20 0001      High Level Balance   High Level Balance Comments side steps and tandem walking ons soft balance beam in parrallel bars      Knee/Hip Exercises: Aerobic   Nustep L 4 6 min      Knee/Hip Exercises: Machines for Strengthening   Cybex Knee Extension 10lb 3x10       Cybex Knee Flexion 25# 3x10    Cybex Leg Press 30# 3x10      Knee/Hip Exercises: Standing   Forward Step Up Both;1 set;2 sets;3 sets;10 reps;Step Height: 2";Step Height: 6"    Walking with Sports Cord 30lb 4 way x 3 each    Other Standing Knee Exercises Alt 6in box taps 2x10       Knee/Hip Exercises: Seated   Sit to Sand 2 sets;10 reps;without UE support   Standing on airex                   PT Short Term Goals - 02/11/20 0920      PT SHORT TERM GOAL #1   Title Pt will be I and compliant with initial HEP.    Status Achieved  PT Long Term Goals - 03/03/20 1356      PT LONG TERM GOAL #3   Baseline limited with increased LBP and spasm with standing    Status Not Met      PT LONG TERM GOAL #4   Title increase knee and ankle strength to 4+/5    Status Partially Met                 Plan - 03/10/20 0841    Clinical Impression Statement Pt did well with machine level interventions. She has some clearance issues with alt box taps. Cues to prevent posterior lean with sit to stands. Some instability issues with side step on soft surface. Tandem walking performed with HHA x1    Stability/Clinical Decision Making Stable/Uncomplicated    Rehab Potential Good    PT Duration 8 weeks    PT Treatment/Interventions ADLs/Self Care Home Management;Stair training;Functional mobility training;Therapeutic activities;Gait training;Neuromuscular re-education;Balance training;Therapeutic exercise;Patient/family education    PT Next Visit Plan continue to work on his balance           Patient will benefit from skilled therapeutic intervention in order to improve the following deficits and impairments:  Abnormal gait, Decreased range of motion, Difficulty walking, Decreased balance, Decreased strength  Visit Diagnosis: Other abnormalities of gait and mobility  Difficulty in walking, not elsewhere classified     Problem List Patient Active Problem List    Diagnosis Date Noted  . History of mitral valve repair 11/26/2018  . Prosthetic valve dysfunction 11/25/2018  . Ectatic abdominal aorta ([place]) 11/25/2018  . 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 History of bladder cancer   . History of pulmonary embolus (PE)   . Acute on chronic diastolic heart failure ([place]) 04/23/2018  . History of aortic valve replacement 04/23/2018  . Atrial fibrillation ([place]) 04/23/2018  . Coronary atherosclerosis of native coronary artery 02/25/2014  . Hyperlipidemia 02/25/2014  . Embolism and thrombosis ([place]) 03/11/2013    Scot Jun, PTA 03/10/2020, 8:43 AM  [place] [place], [name], [address] Phone: [phone]   Fax:  [phone]  Name: Eric [name] MRN: [id number] Date of Birth: Mar 29, 1930

## 2020-03-11 ENCOUNTER — Telehealth: Payer: Self-pay | Admitting: Interventional Cardiology

## 2020-03-11 NOTE — Telephone Encounter (Signed)
Pt c/o BP issue: STAT if pt c/o blurred vision, one-sided weakness or slurred speech  1. What are your last 5 BP readings? 85/53, 115/58, 101/54, 111/54  2. Are you having any other symptoms (ex. Dizziness, headache, blurred vision, passed out)? Fatigue  3. What is your BP issue? Low BP. Per wife, patient stated he was going to bed around 8:15pm last night. She states this is very unusual for him because he usally goes to bed about 10:30-11:00pm. She decided to take his BP because this seemed very odd to her. She took it the first time and it was 85/53. She states this did upset her a little so she called her sister who is a Marine scientist. Her sister stated to take it again. This time it was 115/58. The wife states this made her feel better. Whenever Eric [name] went to lay down in bed, Eric [name] checked his BP again and it was 101/54. She called her sister again and she had her tell Eric [name] to sit up in the bed and take it again. This time it was 111/54. Wife wants to know if they should come in for an appt or what they advise should be done.

## 2020-03-11 NOTE — Telephone Encounter (Signed)
I agree with holding losartan if BP < 110.

## 2020-03-11 NOTE — Telephone Encounter (Signed)
I spoke with patient and his wife.  Yesterday evening patient was feeling tired so decided to go to bed earlier than normal.  As this was unusual wife checked patient's BP.  Readings noted below.  He was not having any dizziness. Just feeling tired. He did not drink as much fluid yesterday as usual.  Did go to physical therapy earlier in the day and felt OK with this. This morning he is feeling fine.  Wife checked BP while on the phone with me and it is 172/97, pulse is 60.  I told patient it would be OK to take normal medications this morning but if BP was below 037 systolic to hold losartan.  I advised him to stay hydrated and change positions slowly.  I asked him to follow up with PCP if fatigue continues. I told patient I would make Dr Irish Lack aware and we would let him know if any changes recommended.

## 2020-03-14 NOTE — Telephone Encounter (Signed)
Called and made patient's wife aware that he should hold losartan if SBP <110. Instructed for patient to continue to monitor BP. She verbalized understanding and thanked me for the call.

## 2020-03-15 ENCOUNTER — Encounter: Payer: Self-pay | Admitting: Physical Therapy

## 2020-03-15 ENCOUNTER — Ambulatory Visit: Payer: Medicare Other | Admitting: Physical Therapy

## 2020-03-15 ENCOUNTER — Other Ambulatory Visit: Payer: Self-pay

## 2020-03-15 DIAGNOSIS — R2689 Other abnormalities of gait and mobility: Secondary | ICD-10-CM | POA: Diagnosis not present

## 2020-03-15 DIAGNOSIS — R262 Difficulty in walking, not elsewhere classified: Secondary | ICD-10-CM

## 2020-03-15 NOTE — Therapy (Signed)
[place]. [place], [name], [address] Phone: [phone]   Fax:  [phone] Progress Note Reporting Period 01/27/20 to 03/15/20 for the first 10 visits.  He did have a break due to wife having covid   See note below for Objective Data and Assessment of Progress/Goals.      Physical Therapy Treatment  Patient Details  Name: JAELIN FACKLER MRN: [id number] Date of Birth: 06-Jun-1929 Referring Provider (PT): [place]   Encounter Date: 03/15/2020   PT End of Session - 03/15/20 0842    Visit Number 10    Date for PT Re-Evaluation 03/28/20    PT Start Time 0758    PT Stop Time 0840    PT Time Calculation (min) 42 min    Activity Tolerance Patient tolerated treatment well    Behavior During Therapy [facility] for tasks assessed/performed           Past Medical History:  Diagnosis Date  . Atrial flutter, paroxysmal ([place])    a. dx 04-02-2014, s/p  successful cardioversion 04-06-2014.  Marland Kitchen Chronic anticoagulation    on Eliquis--  due to recurrent dvt's and pe's  . Dyslipidemia   . History of bladder cancer urologist-  dr [name]   02-13-2016  s/p TURBT per path high grade papillary urothelial carcinoma  . History of DVT (deep vein thrombosis)    2000-- RLE  . History of melanoma excision    left flank;  07/ 2014 left lower leg and right upper chest  . History of prostate cancer    Gleason 6--  s/p  radical prostatectomy 06/ 2000 in [place]---  no recurrence  . History of pulmonary embolus (PE)    2000 & 2005  bilateral  . Hx of valvuloplasty 06/19/2007   a. s/p mitral ring annuloplasty, repair ruptured chordae of P2 flail segments of MV  . S/P aortic valve replacement with bioprosthetic valve    06-19-2007  severe aortic valve stenosis  . 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 Single vessel coronary artery disease   cardiologist-  dr Irish Lack   a. 05/2007 CABG x 1: s/p SVG-OM;  b. 10/2010 Ex MV: EF 72%, inf  attenuation w/o ischemia, brief run of PAT with exercise. To  . Thrombocytopenia ([place])   . Thyroid goiter 2013   nodular  . Wears hearing aid    BILATERAL  . Wears partial dentures    UPPER    Past Surgical History:  Procedure Laterality Date  . AORTIC VALVE REPLACEMENT (AVR)/CORONARY ARTERY BYPASS GRAFTING (CABG)  06-19-2007  dr [name]   SVG to OM1 ;   AVR w/ #23 Mitralflow pericardial and ligation left atrial appendage;  MV repair w/ #28 Sorin 3-D memo Ring Annuloplasty with repair ruptured chordar of P-2 flail segments  . CARDIAC CATHETERIZATION  05-12-2007  dr Leonia Reeves   single vessel 30% ostial LAD,  80% LCx;  severe to critial AS,  moderate MR,  normal LVF, ef 70%,  normal right heart pressures and cardiac outputs,  mild elevated LV end-diastolic pressure    . CARDIOVASCULAR STRESS TEST  11-02-2010  dr Irish Lack   normal nuclear study w/ no ischemia or infarct/scar/  normal LV function and wall motion , ef 72%  . CARDIOVERSION N/A 04/06/2014   Procedure: CARDIOVERSION;  Surgeon: [name], MD;  Location: [place];  Service: Cardiovascular;  Laterality: N/A;   successful  . CATARACT EXTRACTION W/ INTRAOCULAR LENS  IMPLANT, BILATERAL  2012  approx  . CORONARY ARTERY BYPASS GRAFT    . RETROPUBIC RADICAL PROSTATECTOMY  06/ 2000  in [place]  . RIGHT/LEFT HEART CATH AND CORONARY/GRAFT ANGIOGRAPHY N/A 10/29/2018   Procedure: RIGHT/LEFT HEART CATH AND CORONARY/GRAFT ANGIOGRAPHY;  Surgeon: [name], MD;  Location: [place] CV LAB;  Service: Cardiovascular;  Laterality: N/A;  . ROTATOR CUFF REPAIR Left 09/1998  . TEE WITHOUT CARDIOVERSION N/A 04/06/2014   Procedure: TRANSESOPHAGEAL ECHOCARDIOGRAM (TEE);  Surgeon: [name], MD;  [facility];  Service: Cardiovascular;  Laterality: N/A;  mild focal basa LVH of the septum, ef 50-55%, s/p AV annuloplasty ring, elongated chordae, thickened leaflets, mild to mod. MR, multiple small jets/arotic bioprosthetic  valve sits well in position, mild AI/ thickened TV w/ mild reurg./mild LA  . TEE WITHOUT CARDIOVERSION N/A 10/22/2018   Procedure: TRANSESOPHAGEAL ECHOCARDIOGRAM (TEE);  Surgeon: [name], MD;  Location: [facility] ENDOSCOPY;  Service: Cardiovascular;  Laterality: N/A;  . TEE WITHOUT CARDIOVERSION N/A 11/25/2018   Procedure: TRANSESOPHAGEAL ECHOCARDIOGRAM (TEE);  Surgeon: [name], MD;  Location: [place];  Service: Open Heart Surgery;  Laterality: N/A;  . TRANSCATHETER AORTIC VALVE REPLACEMENT, TRANSFEMORAL N/A 11/25/2018   Procedure: TRANSCATHETER AORTIC VALVE REPLACEMENT, TRANSFEMORAL;  Surgeon: [name], MD;  Location: [place];  Service: Open Heart Surgery;  Laterality: N/A;  . TRANSTHORACIC ECHOCARDIOGRAM  03-23-2016   dr Irish Lack   EF 55-60%, reduced contribution atrial contraction to ventricular filling, due to increased ventricular diastolic pressure or atrial contratile dysfunction/  bioprosthetic AV w/ mod. regurg. (valve area 1.97cm^2)/ mild dilated ascending aorta/ mild thicken MV normal function annular ring prosthesis/severe LAE & mod. to sev.RAE/ PASP 33mHg/ mild TR/ ventricle septal motion show paradox  . TRANSURETHRAL RESECTION OF BLADDER TUMOR N/A 02/13/2016   Procedure: TRANSURETHRAL RESECTION OF BLADDER TUMOR (TURBT);  Surgeon: [name] MD;  [facility]  Service: Urology;  Laterality: N/A;  . TRANSURETHRAL RESECTION OF BLADDER TUMOR N/A 09/04/2016   Procedure: TRANSURETHRAL RESECTION OF BLADDER TUMOR (TURBT);  Surgeon: [name] MD;  [facility]  Service: Urology;  Laterality: N/A;    There were no vitals filed for this visit.   Subjective Assessment - 03/15/20 0801    Subjective Reports that he has tried the HEP but has not progressed to standing on dynamic surfaces    Currently in Pain? No/denies                             [facility] - 03/15/20 0001       High Level Balance   High Level Balance Comments on balance beam in parallel bars tandem walk and then side stepping, ball kicsk, toss, then on the airex, walking ball toss, outside walking in grass, slope, pine needles and over and off curbs      Knee/Hip Exercises: Aerobic   Nustep L 5 6 min      Knee/Hip Exercises: Machines for Strengthening   Cybex Knee Extension 10lb 3x10     Cybex Knee Flexion 25# 3x10    Cybex Leg Press 40# 3x10      Knee/Hip Exercises: Standing   Walking with Sports Cord resisted gait all dire tions      Knee/Hip Exercises: Seated   Other Seated Knee/Hip Exercises green tband PF/DF                    PT Short Term  Goals - 02/11/20 0920      PT SHORT TERM GOAL #1   Title Pt will be I and compliant with initial HEP.    Status Achieved             PT Long Term Goals - 03/03/20 1356      PT LONG TERM GOAL #3   Baseline limited with increased LBP and spasm with standing    Status Not Met      PT LONG TERM GOAL #4   Title increase knee and ankle strength to 4+/5    Status Partially Met                 Plan - 03/15/20 0842    Clinical Impression Statement Patient continues to have difficulty with higher level balance activities, especially on dynamic surfaces, her has ankle strategies but hips nad th estep strategies are late.  At home he has not advanced to dynamic surfaces    PT Next Visit Plan continue to work on his balance    Consulted and Agree with Plan of Care Patient           Patient will benefit from skilled therapeutic intervention in order to improve the following deficits and impairments:  Abnormal gait, Decreased range of motion, Difficulty walking, Decreased balance, Decreased strength  Visit Diagnosis: Other abnormalities of gait and mobility  Difficulty in walking, not elsewhere classified     Problem List Patient Active Problem List   Diagnosis Date Noted  . History of mitral valve repair 11/26/2018   . Prosthetic valve dysfunction 11/25/2018  . Ectatic abdominal aorta ([place]) 11/25/2018  . 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 History of bladder cancer   . History of pulmonary embolus (PE)   . Acute on chronic diastolic heart failure ([place]) 04/23/2018  . History of aortic valve replacement 04/23/2018  . Atrial fibrillation ([place]) 04/23/2018  . Coronary atherosclerosis of native coronary artery 02/25/2014  . Hyperlipidemia 02/25/2014  . Embolism and thrombosis ([place]) 03/11/2013    Sumner Boast., PT 03/15/2020, 8:44 AM  [place]. [place], [name], [address] Phone: [phone]   Fax:  [phone]  Name: HUNTLEY KNOOP MRN: [id number] Date of Birth: 30-May-1929

## 2020-03-15 NOTE — Telephone Encounter (Signed)
Called and spoke to patient's wife. She states that the patient took losartan 25 mg yesterday and BP was 87/55 this morning. He did not take the losartan today. Recheck at 1 PM today was 124/82. I have asked for the patient to hold the losartan and continue to monitor BP and let us know if SBP consistently greater than 145. She verbalized understanding and thanked me for the call.

## 2020-03-15 NOTE — Telephone Encounter (Signed)
    Pt's wife calling back, she said yesterday after pt took losartan pt's BP was 87/55, today, this morning she held pt's losartan and at 1 pm pt's BP is at 124/82. She also said pt's losartan it is 25 mg.

## 2020-03-16 NOTE — Telephone Encounter (Signed)
Agree with holding losartan

## 2020-03-17 ENCOUNTER — Other Ambulatory Visit: Payer: Self-pay

## 2020-03-17 ENCOUNTER — Ambulatory Visit: Payer: Medicare Other | Admitting: Physical Therapy

## 2020-03-17 ENCOUNTER — Encounter: Payer: Self-pay | Admitting: Physical Therapy

## 2020-03-17 DIAGNOSIS — R2689 Other abnormalities of gait and mobility: Secondary | ICD-10-CM | POA: Diagnosis not present

## 2020-03-17 DIAGNOSIS — R262 Difficulty in walking, not elsewhere classified: Secondary | ICD-10-CM

## 2020-03-17 NOTE — Telephone Encounter (Signed)
Patient's wife states the patient's BP is now high. She states yesterday last night it was 163/90 and this morning it was 148/103.

## 2020-03-17 NOTE — Therapy (Signed)
[place] [place], [name], [address] Phone: [phone]   Fax:  [phone]  Physical Therapy Treatment  Patient Details  Name: Eric [name] MRN: [id number] Date of Birth: Apr 01, 1930 Referring Provider (PT): [place]   Encounter Date: 03/17/2020   PT End of Session - 03/17/20 1002    Visit Number 11    Date for PT Re-Evaluation 03/28/20    PT Start Time 0843    PT Stop Time 0923    PT Time Calculation (min) 40 min    Activity Tolerance Patient tolerated treatment well    Behavior During Therapy [facility] for tasks assessed/performed           Past Medical History:  Diagnosis Date  . Atrial flutter, paroxysmal ([place])    a. dx 04-02-2014, s/p  successful cardioversion 04-06-2014.  Marland Kitchen Chronic anticoagulation    on Eliquis--  due to recurrent dvt's and pe's  . Dyslipidemia   . History of bladder cancer urologist-  dr [name]   02-13-2016  s/p TURBT per path high grade papillary urothelial carcinoma  . History of DVT (deep vein thrombosis)    2000-- RLE  . History of melanoma excision    left flank;  07/ 2014 left lower leg and right upper chest  . History of prostate cancer    Gleason 6--  s/p  radical prostatectomy 06/ 2000 in [place]---  no recurrence  . History of pulmonary embolus (PE)    2000 & 2005  bilateral  . Hx of valvuloplasty 06/19/2007   a. s/p mitral ring annuloplasty, repair ruptured chordae of P2 flail segments of MV  . S/P aortic valve replacement with bioprosthetic valve    06-19-2007  severe aortic valve stenosis  . 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 Single vessel coronary artery disease   cardiologist-  dr Irish Lack   a. 05/2007 CABG x 1: s/p SVG-OM;  b. 10/2010 Ex MV: EF 72%, inf attenuation w/o ischemia, brief run of PAT with exercise. To  . Thrombocytopenia ([place])   . Thyroid goiter 2013   nodular  . Wears hearing aid    BILATERAL  . Wears partial dentures    UPPER    Past  Surgical History:  Procedure Laterality Date  . AORTIC VALVE REPLACEMENT (AVR)/CORONARY ARTERY BYPASS GRAFTING (CABG)  06-19-2007  dr [name]   SVG to OM1 ;   AVR w/ #23 Mitralflow pericardial and ligation left atrial appendage;  MV repair w/ #28 Sorin 3-D memo Ring Annuloplasty with repair ruptured chordar of P-2 flail segments  . CARDIAC CATHETERIZATION  05-12-2007  dr Leonia Reeves   single vessel 30% ostial LAD,  80% LCx;  severe to critial AS,  moderate MR,  normal LVF, ef 70%,  normal right heart pressures and cardiac outputs,  mild elevated LV end-diastolic pressure    . CARDIOVASCULAR STRESS TEST  11-02-2010  dr Irish Lack   normal nuclear study w/ no ischemia or infarct/scar/  normal LV function and wall motion , ef 72%  . CARDIOVERSION N/A 04/06/2014   Procedure: CARDIOVERSION;  Surgeon: [name], MD;  Location: [place];  Service: Cardiovascular;  Laterality: N/A;   successful  . CATARACT EXTRACTION W/ INTRAOCULAR LENS  IMPLANT, BILATERAL  2012  approx  . CORONARY ARTERY BYPASS GRAFT    . RETROPUBIC RADICAL PROSTATECTOMY  06/ 2000  in [place], [place]  . RIGHT/LEFT HEART CATH AND CORONARY/GRAFT ANGIOGRAPHY N/A 10/29/2018   Procedure: RIGHT/LEFT HEART CATH AND CORONARY/GRAFT  ANGIOGRAPHY;  Surgeon: [name], MD;  Location: [place] CV LAB;  Service: Cardiovascular;  Laterality: N/A;  . ROTATOR CUFF REPAIR Left 09/1998  . TEE WITHOUT CARDIOVERSION N/A 04/06/2014   Procedure: TRANSESOPHAGEAL ECHOCARDIOGRAM (TEE);  Surgeon: [name], MD;  Location: [facility] ENDOSCOPY;  Service: Cardiovascular;  Laterality: N/A;  mild focal basa LVH of the septum, ef 50-55%, s/p AV annuloplasty ring, elongated chordae, thickened leaflets, mild to mod. MR, multiple small jets/arotic bioprosthetic valve sits well in position, mild AI/ thickened TV w/ mild reurg./mild LA  . TEE WITHOUT CARDIOVERSION N/A 10/22/2018   Procedure: TRANSESOPHAGEAL ECHOCARDIOGRAM (TEE);  Surgeon: [name], MD;   Location: [facility] ENDOSCOPY;  Service: Cardiovascular;  Laterality: N/A;  . TEE WITHOUT CARDIOVERSION N/A 11/25/2018   Procedure: TRANSESOPHAGEAL ECHOCARDIOGRAM (TEE);  Surgeon: [name], MD;  Location: [place];  Service: Open Heart Surgery;  Laterality: N/A;  . TRANSCATHETER AORTIC VALVE REPLACEMENT, TRANSFEMORAL N/A 11/25/2018   Procedure: TRANSCATHETER AORTIC VALVE REPLACEMENT, TRANSFEMORAL;  Surgeon: [name], MD;  Location: [place];  Service: Open Heart Surgery;  Laterality: N/A;  . TRANSTHORACIC ECHOCARDIOGRAM  03-23-2016   dr Irish Lack   EF 55-60%, reduced contribution atrial contraction to ventricular filling, due to increased ventricular diastolic pressure or atrial contratile dysfunction/  bioprosthetic AV w/ mod. regurg. (valve area 1.97cm^2)/ mild dilated ascending aorta/ mild thicken MV normal function annular ring prosthesis/severe LAE & mod. to sev.RAE/ PASP 77mmHg/ mild TR/ ventricle septal motion show paradox  . TRANSURETHRAL RESECTION OF BLADDER TUMOR N/A 02/13/2016   Procedure: TRANSURETHRAL RESECTION OF BLADDER TUMOR (TURBT);  Surgeon: Nickie Retort, MD;  Location: [facility];  Service: Urology;  Laterality: N/A;  . TRANSURETHRAL RESECTION OF BLADDER TUMOR N/A 09/04/2016   Procedure: TRANSURETHRAL RESECTION OF BLADDER TUMOR (TURBT);  Surgeon: Nickie Retort, MD;  Location: [facility];  Service: Urology;  Laterality: N/A;    There were no vitals filed for this visit.   Subjective Assessment - 03/17/20 0850    Subjective Patient reports that he is worried about his BP.  I checked it prior to exercise.  150/95    Currently in Pain? No/denies                             [facility] Adult PT Treatment/Exercise - 03/17/20 0001      High Level Balance   High Level Balance Comments balance beam in parallel bars, on minitramp marches and bounces light hand hold, then tried standing on the minitramp with head turns, solid surface  head turns side to side and up and down, on folded exercise mat standing ball toss, head turns and eyes closed, walking ball toss      Knee/Hip Exercises: Aerobic   Nustep L 5 6 min      Knee/Hip Exercises: Machines for Strengthening   Cybex Knee Extension 10lb 3x10     Cybex Knee Flexion 25# 3x10    Other Machine standing on airex 10# rows and straight arm pulls CGA for balance, cues for posture and core activation                    PT Short Term Goals - 02/11/20 0920      PT SHORT TERM GOAL #1   Title Pt will be I and compliant with initial HEP.    Status Achieved             PT  Long Term Goals - 03/17/20 1007      PT LONG TERM GOAL #2   Title Pt will improv BERG balance score >50 to show improved balance    Status On-going                 Plan - 03/17/20 1003    Clinical Impression Statement Balance is the biggest issue, has difficulty right when he stands and the worst is on dynamic surfaces.  He reports some issues with BP, I checked it for him today and it was 150/95.    PT Next Visit Plan continue to work on his balance    Consulted and Agree with Plan of Care Patient           Patient will benefit from skilled therapeutic intervention in order to improve the following deficits and impairments:  Abnormal gait, Decreased range of motion, Difficulty walking, Decreased balance, Decreased strength  Visit Diagnosis: Other abnormalities of gait and mobility  Difficulty in walking, not elsewhere classified     Problem List Patient Active Problem List   Diagnosis Date Noted  . History of mitral valve repair 11/26/2018  . Prosthetic valve dysfunction 11/25/2018  . Ectatic abdominal aorta ([place]) 11/25/2018  . 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 History of bladder cancer   . History of pulmonary embolus (PE)   . Acute on chronic diastolic heart failure ([place]) 04/23/2018  . History of aortic valve replacement 04/23/2018  . Atrial fibrillation  ([place]) 04/23/2018  . Coronary atherosclerosis of native coronary artery 02/25/2014  . Hyperlipidemia 02/25/2014  . Embolism and thrombosis ([place]) 03/11/2013    Sumner Boast., PT 03/17/2020, 10:08 AM  [place] [place], [name], [address] Phone: [phone]   Fax:  [phone]  Name: SURYA SCHROETER MRN: [id number] Date of Birth: 09-04-1929

## 2020-03-17 NOTE — Telephone Encounter (Signed)
Called and spoke to patient's wife. She states that his BP was elevated last night at 163/90 and again this morning at 148/103. Denies having any Sx. Instructed for the patient to take his 25 mg of losartan. Instructed for patient to continue to monitor BP and hold losartan if SBP < 110. Made her aware that I will discuss with Dr. Irish Lack and call back with any additional recommendations. She verbalized understanding and thanked me for the call.

## 2020-03-18 ENCOUNTER — Telehealth: Payer: Self-pay | Admitting: Interventional Cardiology

## 2020-03-18 MED ORDER — LOSARTAN POTASSIUM 25 MG PO TABS: ORAL_TABLET | ORAL | 3 refills | Status: DC

## 2020-03-18 NOTE — Telephone Encounter (Signed)
Continue to use the low dose of losartan prn.  If Systolic < 340, hold losaartan.  Otherwise can take it.  If systolics remain consistently high (>140 mm Hg)even after losartan 25 mg, then let us know

## 2020-03-18 NOTE — Telephone Encounter (Signed)
Called and made the patient's wife aware of the recommendations below. RX updated. She verbalized understanding and thanked me for the call.

## 2020-03-18 NOTE — Telephone Encounter (Signed)
Follow up:     Patient wife calling to report patient BP today 150/94 pluses 68 wife gave patient a pill around 10:15 am this morning.

## 2020-03-18 NOTE — Addendum Note (Signed)
Addended by: Drue Novel I on: 03/18/2020 04:10 PM   Modules accepted: Orders

## 2020-03-18 NOTE — Telephone Encounter (Signed)
See other phone note

## 2020-03-18 NOTE — Telephone Encounter (Signed)
Patient's wife left the following message below:   Patient wife calling to report patient BP today 150/94 pluses 62 wife gave patient a pill around 10:15 am this morning.   Will forward to Dr. Irish Lack for review and recommendation.

## 2020-03-22 ENCOUNTER — Encounter: Payer: Self-pay | Admitting: Physical Therapy

## 2020-03-22 ENCOUNTER — Ambulatory Visit: Payer: Medicare Other | Attending: Physician Assistant | Admitting: Physical Therapy

## 2020-03-22 ENCOUNTER — Other Ambulatory Visit: Payer: Self-pay

## 2020-03-22 DIAGNOSIS — R262 Difficulty in walking, not elsewhere classified: Secondary | ICD-10-CM | POA: Diagnosis present

## 2020-03-22 DIAGNOSIS — R2689 Other abnormalities of gait and mobility: Secondary | ICD-10-CM | POA: Insufficient documentation

## 2020-03-22 NOTE — Therapy (Signed)
[place]. [place], [name], [address] Phone: [phone]   Fax:  [phone]  Physical Therapy Treatment  Patient Details  Name: Eric [name] MRN: [id number] Date of Birth: 01-15-1930 Referring Provider (PT): [place]   Encounter Date: 03/22/2020   PT End of Session - 03/22/20 0840    Visit Number 12    Date for PT Re-Evaluation 03/28/20    PT Start Time 0758    PT Stop Time 0841    PT Time Calculation (min) 43 min    Activity Tolerance Patient tolerated treatment well    Behavior During Therapy [facility] for tasks assessed/performed           Past Medical History:  Diagnosis Date  . Atrial flutter, paroxysmal ([place])    a. dx 04-02-2014, s/p  successful cardioversion 04-06-2014.  Marland Kitchen Chronic anticoagulation    on Eliquis--  due to recurrent dvt's and pe's  . Dyslipidemia   . History of bladder cancer urologist-  dr [name]   02-13-2016  s/p TURBT per path high grade papillary urothelial carcinoma  . History of DVT (deep vein thrombosis)    2000-- RLE  . History of melanoma excision    left flank;  07/ 2014 left lower leg and right upper chest  . History of prostate cancer    Gleason 6--  s/p  radical prostatectomy 06/ 2000 in [place]---  no recurrence  . History of pulmonary embolus (PE)    2000 & 2005  bilateral  . Hx of valvuloplasty 06/19/2007   a. s/p mitral ring annuloplasty, repair ruptured chordae of P2 flail segments of MV  . S/P aortic valve replacement with bioprosthetic valve    06-19-2007  severe aortic valve stenosis  . 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 Single vessel coronary artery disease   cardiologist-  dr Irish Lack   a. 05/2007 CABG x 1: s/p SVG-OM;  b. 10/2010 Ex MV: EF 72%, inf attenuation w/o ischemia, brief run of PAT with exercise. To  . Thrombocytopenia ([place])   . Thyroid goiter 2013   nodular  . Wears hearing aid    BILATERAL  . Wears partial dentures    UPPER    Past  Surgical History:  Procedure Laterality Date  . AORTIC VALVE REPLACEMENT (AVR)/CORONARY ARTERY BYPASS GRAFTING (CABG)  06-19-2007  dr [name]   SVG to OM1 ;   AVR w/ #23 Mitralflow pericardial and ligation left atrial appendage;  MV repair w/ #28 Sorin 3-D memo Ring Annuloplasty with repair ruptured chordar of P-2 flail segments  . CARDIAC CATHETERIZATION  05-12-2007  dr Leonia Reeves   single vessel 30% ostial LAD,  80% LCx;  severe to critial AS,  moderate MR,  normal LVF, ef 70%,  normal right heart pressures and cardiac outputs,  mild elevated LV end-diastolic pressure    . CARDIOVASCULAR STRESS TEST  11-02-2010  dr Irish Lack   normal nuclear study w/ no ischemia or infarct/scar/  normal LV function and wall motion , ef 72%  . CARDIOVERSION N/A 04/06/2014   Procedure: CARDIOVERSION;  Surgeon: [name], MD;  Location: [place];  Service: Cardiovascular;  Laterality: N/A;   successful  . CATARACT EXTRACTION W/ INTRAOCULAR LENS  IMPLANT, BILATERAL  2012  approx  . CORONARY ARTERY BYPASS GRAFT    . RETROPUBIC RADICAL PROSTATECTOMY  06/ 2000  in [place], [place]  . RIGHT/LEFT HEART CATH AND CORONARY/GRAFT ANGIOGRAPHY N/A 10/29/2018   Procedure: RIGHT/LEFT HEART CATH AND CORONARY/GRAFT  ANGIOGRAPHY;  Surgeon: [name], MD;  Location: [place] CV LAB;  Service: Cardiovascular;  Laterality: N/A;  . ROTATOR CUFF REPAIR Left 09/1998  . TEE WITHOUT CARDIOVERSION N/A 04/06/2014   Procedure: TRANSESOPHAGEAL ECHOCARDIOGRAM (TEE);  Surgeon: [name], MD;  Location: [facility] ENDOSCOPY;  Service: Cardiovascular;  Laterality: N/A;  mild focal basa LVH of the septum, ef 50-55%, s/p AV annuloplasty ring, elongated chordae, thickened leaflets, mild to mod. MR, multiple small jets/arotic bioprosthetic valve sits well in position, mild AI/ thickened TV w/ mild reurg./mild LA  . TEE WITHOUT CARDIOVERSION N/A 10/22/2018   Procedure: TRANSESOPHAGEAL ECHOCARDIOGRAM (TEE);  Surgeon: [name], MD;   Location: [facility] ENDOSCOPY;  Service: Cardiovascular;  Laterality: N/A;  . TEE WITHOUT CARDIOVERSION N/A 11/25/2018   Procedure: TRANSESOPHAGEAL ECHOCARDIOGRAM (TEE);  Surgeon: [name], MD;  Location: [place];  Service: Open Heart Surgery;  Laterality: N/A;  . TRANSCATHETER AORTIC VALVE REPLACEMENT, TRANSFEMORAL N/A 11/25/2018   Procedure: TRANSCATHETER AORTIC VALVE REPLACEMENT, TRANSFEMORAL;  Surgeon: [name], MD;  Location: [place];  Service: Open Heart Surgery;  Laterality: N/A;  . TRANSTHORACIC ECHOCARDIOGRAM  03-23-2016   dr Irish Lack   EF 55-60%, reduced contribution atrial contraction to ventricular filling, due to increased ventricular diastolic pressure or atrial contratile dysfunction/  bioprosthetic AV w/ mod. regurg. (valve area 1.97cm^2)/ mild dilated ascending aorta/ mild thicken MV normal function annular ring prosthesis/severe LAE & mod. to sev.RAE/ PASP 85mmHg/ mild TR/ ventricle septal motion show paradox  . TRANSURETHRAL RESECTION OF BLADDER TUMOR N/A 02/13/2016   Procedure: TRANSURETHRAL RESECTION OF BLADDER TUMOR (TURBT);  Surgeon: Nickie Retort, MD;  [facility];  Service: Urology;  Laterality: N/A;  . TRANSURETHRAL RESECTION OF BLADDER TUMOR N/A 09/04/2016   Procedure: TRANSURETHRAL RESECTION OF BLADDER TUMOR (TURBT);  Surgeon: Nickie Retort, MD;  Location: [facility];  Service: Urology;  Laterality: N/A;    There were no vitals filed for this visit.   Subjective Assessment - 03/22/20 0758    Subjective Doing fine, no falls or stumbles.    Currently in Pain? No/denies                             [facility] Adult PT Treatment/Exercise - 03/22/20 0001      High Level Balance   High Level Balance Comments balance beam in parallel bars,side step and tandem walking, on airex head turns, eyes closed       Knee/Hip Exercises: Aerobic   Nustep L 5 6 min      Knee/Hip Exercises: Machines for Strengthening    Cybex Knee Extension 10lb 3x10     Cybex Knee Flexion 35# 3x10    Other Machine standing on airex 10# rows and straight arm pulls CGA for balance, cues for posture and core activation      Knee/Hip Exercises: Standing   Other Standing Knee Exercises on airex 4in step up at stairs x10 each    Other Standing Knee Exercises Alt 8 in taps on airex 3x10                     PT Short Term Goals - 02/11/20 0920      PT SHORT TERM GOAL #1   Title Pt will be I and compliant with initial HEP.    Status Achieved             PT Long Term Goals - 03/17/20 1007  PT LONG TERM GOAL #2   Title Pt will improv BERG balance score >50 to show improved balance    Status On-going                 Plan - 03/22/20 0841    Clinical Impression Statement Pt continues to have issues with balance interventions on non compliant surfaces. Frequent LOB noted standing on airex at taps requiring min to mod assist to correct. UE use x2 needed with tandem walking on balance beam.    Rehab Potential Good    PT Frequency 2x / week    PT Duration 8 weeks    PT Treatment/Interventions ADLs/Self Care Home Management;Stair training;Functional mobility training;Therapeutic activities;Gait training;Neuromuscular re-education;Balance training;Therapeutic exercise;Patient/family education    PT Next Visit Plan continue to work on his balance           Patient will benefit from skilled therapeutic intervention in order to improve the following deficits and impairments:  Abnormal gait, Decreased range of motion, Difficulty walking, Decreased balance, Decreased strength  Visit Diagnosis: Other abnormalities of gait and mobility  Difficulty in walking, not elsewhere classified     Problem List Patient Active Problem List   Diagnosis Date Noted  . History of mitral valve repair 11/26/2018  . Prosthetic valve dysfunction 11/25/2018  . Ectatic abdominal aorta ([place]) 11/25/2018  . S/P  valve-in-valve TAVR 11/25/2018  . History of bladder cancer   . History of pulmonary embolus (PE)   . Acute on chronic diastolic heart failure ([place]) 04/23/2018  . History of aortic valve replacement 04/23/2018  . Atrial fibrillation ([place]) 04/23/2018  . Coronary atherosclerosis of native coronary artery 02/25/2014  . Hyperlipidemia 02/25/2014  . Embolism and thrombosis ([place]) 03/11/2013    Scot Jun, PTA 03/22/2020, 8:43 AM  [place]. [place], [name], [address] Phone: [phone]   Fax:  [phone]  Name: Eric [name] MRN: [id number] Date of Birth: 03/29/1930

## 2020-03-24 ENCOUNTER — Encounter: Payer: Self-pay | Admitting: Physical Therapy

## 2020-03-24 ENCOUNTER — Ambulatory Visit: Payer: Medicare Other | Admitting: Physical Therapy

## 2020-03-24 ENCOUNTER — Other Ambulatory Visit: Payer: Self-pay

## 2020-03-24 DIAGNOSIS — R262 Difficulty in walking, not elsewhere classified: Secondary | ICD-10-CM

## 2020-03-24 DIAGNOSIS — R2689 Other abnormalities of gait and mobility: Secondary | ICD-10-CM | POA: Diagnosis not present

## 2020-03-24 NOTE — Therapy (Signed)
[place]. [place], [name], [address] Phone: [phone]   Fax:  [phone]  Physical Therapy Treatment  Patient Details  Name: Eric [name] MRN: [id number] Date of Birth: Jun 30, 1929 Referring Provider (PT): [place]   Encounter Date: 03/24/2020   PT End of Session - 03/24/20 0842    Visit Number 13    Date for PT Re-Evaluation 03/28/20    PT Start Time 0800    PT Stop Time 0844    PT Time Calculation (min) 44 min    Activity Tolerance Patient tolerated treatment well    Behavior During Therapy [facility] for tasks assessed/performed           Past Medical History:  Diagnosis Date  . Atrial flutter, paroxysmal ([place])    a. dx 04-02-2014, s/p  successful cardioversion 04-06-2014.  Marland Kitchen Chronic anticoagulation    on Eliquis--  due to recurrent dvt's and pe's  . Dyslipidemia   . History of bladder cancer urologist-  dr [name]   02-13-2016  s/p TURBT per path high grade papillary urothelial carcinoma  . History of DVT (deep vein thrombosis)    2000-- RLE  . History of melanoma excision    left flank;  07/ 2014 left lower leg and right upper chest  . History of prostate cancer    Gleason 6--  s/p  radical prostatectomy 06/ 2000 in [place]---  no recurrence  . History of pulmonary embolus (PE)    2000 & 2005  bilateral  . Hx of valvuloplasty 06/19/2007   a. s/p mitral ring annuloplasty, repair ruptured chordae of P2 flail segments of MV  . S/P aortic valve replacement with bioprosthetic valve    06-19-2007  severe aortic valve stenosis  . 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 Single vessel coronary artery disease   cardiologist-  dr Irish Lack   a. 05/2007 CABG x 1: s/p SVG-OM;  b. 10/2010 Ex MV: EF 72%, inf attenuation w/o ischemia, brief run of PAT with exercise. To  . Thrombocytopenia ([place])   . Thyroid goiter 2013   nodular  . Wears hearing aid    BILATERAL  . Wears partial dentures    UPPER    Past  Surgical History:  Procedure Laterality Date  . AORTIC VALVE REPLACEMENT (AVR)/CORONARY ARTERY BYPASS GRAFTING (CABG)  06-19-2007  dr [name]   SVG to OM1 ;   AVR w/ #23 Mitralflow pericardial and ligation left atrial appendage;  MV repair w/ #28 Sorin 3-D memo Ring Annuloplasty with repair ruptured chordar of P-2 flail segments  . CARDIAC CATHETERIZATION  05-12-2007  dr Leonia Reeves   single vessel 30% ostial LAD,  80% LCx;  severe to critial AS,  moderate MR,  normal LVF, ef 70%,  normal right heart pressures and cardiac outputs,  mild elevated LV end-diastolic pressure    . CARDIOVASCULAR STRESS TEST  11-02-2010  dr Irish Lack   normal nuclear study w/ no ischemia or infarct/scar/  normal LV function and wall motion , ef 72%  . CARDIOVERSION N/A 04/06/2014   Procedure: CARDIOVERSION;  Surgeon: [name], MD;  Location: [place];  Service: Cardiovascular;  Laterality: N/A;   successful  . CATARACT EXTRACTION W/ INTRAOCULAR LENS  IMPLANT, BILATERAL  2012  approx  . CORONARY ARTERY BYPASS GRAFT    . RETROPUBIC RADICAL PROSTATECTOMY  06/ 2000  in [place], [place]  . RIGHT/LEFT HEART CATH AND CORONARY/GRAFT ANGIOGRAPHY N/A 10/29/2018   Procedure: RIGHT/LEFT HEART CATH AND CORONARY/GRAFT  ANGIOGRAPHY;  Surgeon: [name], MD;  Location: [facility];  Service: Cardiovascular;  Laterality: N/A;  . ROTATOR CUFF REPAIR Left 09/1998  . TEE WITHOUT CARDIOVERSION N/A 04/06/2014   Procedure: TRANSESOPHAGEAL ECHOCARDIOGRAM (TEE);  Surgeon: [name], MD;  Location: [facility] ENDOSCOPY;  Service: Cardiovascular;  Laterality: N/A;  mild focal basa LVH of the septum, ef 50-55%, s/p AV annuloplasty ring, elongated chordae, thickened leaflets, mild to mod. MR, multiple small jets/arotic bioprosthetic valve sits well in position, mild AI/ thickened TV w/ mild reurg./mild LA  . TEE WITHOUT CARDIOVERSION N/A 10/22/2018   Procedure: TRANSESOPHAGEAL ECHOCARDIOGRAM (TEE);  Surgeon: [name], MD;   Location: [facility] ENDOSCOPY;  Service: Cardiovascular;  Laterality: N/A;  . TEE WITHOUT CARDIOVERSION N/A 11/25/2018   Procedure: TRANSESOPHAGEAL ECHOCARDIOGRAM (TEE);  Surgeon: [name], MD;  Location: [facility];  Service: Open Heart Surgery;  Laterality: N/A;  . TRANSCATHETER AORTIC VALVE REPLACEMENT, TRANSFEMORAL N/A 11/25/2018   Procedure: TRANSCATHETER AORTIC VALVE REPLACEMENT, TRANSFEMORAL;  Surgeon: [name], MD;  Location: [place];  Service: Open Heart Surgery;  Laterality: N/A;  . TRANSTHORACIC ECHOCARDIOGRAM  03-23-2016   dr Irish Lack   EF 55-60%, reduced contribution atrial contraction to ventricular filling, due to increased ventricular diastolic pressure or atrial contratile dysfunction/  bioprosthetic AV w/ mod. regurg. (valve area 1.97cm^2)/ mild dilated ascending aorta/ mild thicken MV normal function annular ring prosthesis/severe LAE & mod. to sev.RAE/ PASP 36mHg/ mild TR/ ventricle septal motion show paradox  . TRANSURETHRAL RESECTION OF BLADDER TUMOR N/A 02/13/2016   Procedure: TRANSURETHRAL RESECTION OF BLADDER TUMOR (TURBT);  Surgeon: [name] MD;  [facility]  Service: Urology;  Laterality: N/A;  . TRANSURETHRAL RESECTION OF BLADDER TUMOR N/A 09/04/2016   Procedure: TRANSURETHRAL RESECTION OF BLADDER TUMOR (TURBT);  Surgeon: [name] MD;  [facility]  Service: Urology;  Laterality: N/A;    There were no vitals filed for this visit.   Subjective Assessment - 03/24/20 0803    Subjective "I am fine"    Currently in Pain? No/denies              [facility] - [facility]      Berg Balance Test   Sit to Stand Able to stand without using hands and stabilize independently    Standing Unsupported Able to stand safely 2 minutes    Sitting with Back Unsupported but Feet Supported on Floor or Stool Able to sit safely and securely 2 minutes    Stand to Sit Sits safely with minimal use of hands     Transfers Able to transfer safely, minor use of hands    Standing Unsupported with Eyes Closed Able to stand 10 seconds with supervision    Standing Unsupported with Feet Together Able to place feet together independently and stand 1 minute safely    From Standing, Reach Forward with Outstretched Arm Can reach forward >12 cm safely (5")    From Standing Position, Pick up Object from Floor Able to pick up shoe safely and easily    From Standing Position, Turn to Look Behind Over each Shoulder Looks behind one side only/other side shows less weight shift    Turn 360 Degrees Able to turn 360 degrees safely in 4 seconds or less    Standing Unsupported, Alternately Place Feet on Step/Stool Able to stand independently and complete 8 steps >20 seconds    Standing Unsupported, One Foot in Front Able to take small  step independently and hold 30 seconds    Standing on One Leg Tries to lift leg/unable to hold 3 seconds but remains standing independently    Total Score 47      Timed Up and Go Test   Normal TUG (seconds) 11.23                         [facility] Adult PT Treatment/Exercise - 03/24/20 0001      High Level Balance   High Level Balance Comments balance beam in parallel bars,side step and tandem walking, in parrallel bars SLS with some UE assist 3x10 sec each, Side step on and off airex       Knee/Hip Exercises: Aerobic   Nustep L 5 6 min      Knee/Hip Exercises: Machines for Strengthening   Cybex Knee Extension 10lb 3x10     Cybex Knee Flexion 35# 3x10                    PT Short Term Goals - 02/11/20 0920      PT SHORT TERM GOAL #1   Title Pt will be I and compliant with initial HEP.    Status Achieved             PT Long Term Goals - 03/24/20 4665      PT LONG TERM GOAL #2   Title Pt will improv BERG balance score >50 to show improved balance    Status On-going      PT LONG TERM GOAL #3   Title decrease TUG time to 12 seconds    Status Achieved                  Plan - 03/24/20 0843    Clinical Impression Statement Pt has progressed increasing his BERG balance score and decreasing his TUG time. Some goals met. He has the most difficulty with tandem and SLS. Some LOB on non compliant surfaces requiring assist to correct.    Stability/Clinical Decision Making Stable/Uncomplicated    Rehab Potential Good    PT Frequency 2x / week    PT Duration 8 weeks    PT Treatment/Interventions ADLs/Self Care Home Management;Stair training;Functional mobility training;Therapeutic activities;Gait training;Neuromuscular re-education;Balance training;Therapeutic exercise;Patient/family education    PT Next Visit Plan continue to work on his balance           Patient will benefit from skilled therapeutic intervention in order to improve the following deficits and impairments:  Abnormal gait, Decreased range of motion, Difficulty walking, Decreased balance, Decreased strength  Visit Diagnosis: Other abnormalities of gait and mobility  Difficulty in walking, not elsewhere classified     Problem List Patient Active Problem List   Diagnosis Date Noted  . History of mitral valve repair 11/26/2018  . Prosthetic valve dysfunction 11/25/2018  . Ectatic abdominal aorta ([place]) 11/25/2018  . 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 History of bladder cancer   . History of pulmonary embolus (PE)   . Acute on chronic diastolic heart failure ([place]) 04/23/2018  . History of aortic valve replacement 04/23/2018  . Atrial fibrillation ([place]) 04/23/2018  . Coronary atherosclerosis of native coronary artery 02/25/2014  . Hyperlipidemia 02/25/2014  . Embolism and thrombosis ([place]) 03/11/2013    Scot Jun, PTA 03/24/2020, 8:45 AM  [place]. [place], [name], [address] Phone: [phone]   Fax:  [phone]  Name: DONTELL MIAN  MRN: [id number] Date of Birth:  [birth year]

## 2020-03-29 ENCOUNTER — Other Ambulatory Visit: Payer: Self-pay

## 2020-03-29 ENCOUNTER — Encounter: Payer: Self-pay | Admitting: Physical Therapy

## 2020-03-29 ENCOUNTER — Ambulatory Visit: Payer: Medicare Other | Admitting: Physical Therapy

## 2020-03-29 DIAGNOSIS — R2689 Other abnormalities of gait and mobility: Secondary | ICD-10-CM | POA: Diagnosis not present

## 2020-03-29 DIAGNOSIS — R262 Difficulty in walking, not elsewhere classified: Secondary | ICD-10-CM

## 2020-03-29 NOTE — Therapy (Signed)
[place]. [place], [name], [address] Phone: [phone]   Fax:  [phone]  Physical Therapy Treatment  Patient Details  Name: Eric [name] MRN: [id number] Date of Birth: [birth year] Referring Provider (PT): [place]   Encounter Date: 03/29/2020   PT End of Session - 03/29/20 0925    Visit Number 14    Date for PT Re-Evaluation 03/28/20    PT Start Time 0845    PT Stop Time 0927    PT Time Calculation (min) 42 min    Activity Tolerance Patient tolerated treatment well    Behavior During Therapy [facility] for tasks assessed/performed           Past Medical History:  Diagnosis Date   Atrial flutter, paroxysmal ([place])    a. dx 04-02-2014, s/p  successful cardioversion 04-06-2014.   Chronic anticoagulation    on Eliquis--  due to recurrent dvt's and pe's   Dyslipidemia    History of bladder cancer urologist-  dr [name]   02-13-2016  s/p TURBT per path high grade papillary urothelial carcinoma   History of DVT (deep vein thrombosis)    2000-- RLE   History of melanoma excision    left flank;  07/ 2014 left lower leg and right upper chest   History of prostate cancer    Gleason 6--  s/p  radical prostatectomy 06/ 2000 in [place]---  no recurrence   History of pulmonary embolus (PE)    2000 & 2005  bilateral   Hx of valvuloplasty 06/19/2007   a. 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 05/2007 CABG x 1: s/p SVG-OM;  b. 10/2010 Ex MV: EF 72%, inf attenuation w/o ischemia, brief run of PAT with exercise. To   Thrombocytopenia ([place])    Thyroid goiter 2013   nodular   Wears hearing aid    BILATERAL   Wears partial dentures    UPPER    Past  Surgical History:  Procedure Laterality Date   AORTIC VALVE REPLACEMENT (AVR)/CORONARY ARTERY BYPASS GRAFTING (CABG)  06-19-2007  dr [name]   SVG to OM1 ;   AVR w/ #23 Mitralflow pericardial and ligation left atrial appendage;  MV repair w/ #28 Sorin 3-D memo Ring Annuloplasty with repair ruptured chordar of P-2 flail segments   CARDIAC CATHETERIZATION  05-12-2007  dr Leonia Reeves   single vessel 30% ostial LAD,  80% LCx;  severe to critial AS,  moderate MR,  normal LVF, ef 70%,  normal right heart pressures and cardiac outputs,  mild elevated LV end-diastolic pressure     CARDIOVASCULAR STRESS TEST  11-02-2010  dr Irish Lack   normal nuclear study w/ no ischemia or infarct/scar/  normal LV function and wall motion , ef 72%   CARDIOVERSION N/A 04/06/2014   Procedure: CARDIOVERSION;  Surgeon: [name], MD;  Location: [place] [facility];  Service: Cardiovascular;  Laterality: N/A;   successful   CATARACT EXTRACTION W/ INTRAOCULAR LENS  IMPLANT, BILATERAL  2012  approx   CORONARY ARTERY BYPASS GRAFT     RETROPUBIC RADICAL PROSTATECTOMY  06/ 2000  in [place]   RIGHT/LEFT HEART CATH AND CORONARY/GRAFT ANGIOGRAPHY N/A 10/29/2018   Procedure: RIGHT/LEFT HEART CATH AND CORONARY/GRAFT  ANGIOGRAPHY;  Surgeon: [name], MD;  Location: [facility];  Service: Cardiovascular;  Laterality: N/A;   ROTATOR CUFF REPAIR Left 09/1998   TEE WITHOUT CARDIOVERSION N/A 04/06/2014   Procedure: TRANSESOPHAGEAL ECHOCARDIOGRAM (TEE);  Surgeon: [name], MD;  Location: [facility] ENDOSCOPY;  Service: Cardiovascular;  Laterality: N/A;  mild focal basa LVH of the septum, ef 50-55%, s/p AV annuloplasty ring, elongated chordae, thickened leaflets, mild to mod. MR, multiple small jets/arotic bioprosthetic valve sits well in position, mild AI/ thickened TV w/ mild reurg./mild LA   TEE WITHOUT CARDIOVERSION N/A 10/22/2018   Procedure: TRANSESOPHAGEAL ECHOCARDIOGRAM (TEE);  Surgeon: [name], MD;   Location: [facility] ENDOSCOPY;  Service: Cardiovascular;  Laterality: N/A;   TEE WITHOUT CARDIOVERSION N/A 11/25/2018   Procedure: TRANSESOPHAGEAL ECHOCARDIOGRAM (TEE);  Surgeon: [name], MD;  Location: [place];  Service: Open Heart Surgery;  Laterality: N/A;   TRANSCATHETER AORTIC VALVE REPLACEMENT, TRANSFEMORAL N/A 11/25/2018   Procedure: TRANSCATHETER AORTIC VALVE REPLACEMENT, TRANSFEMORAL;  Surgeon: [name], MD;  Location: [place];  Service: Open Heart Surgery;  Laterality: N/A;   TRANSTHORACIC ECHOCARDIOGRAM  03-23-2016   dr Irish Lack   EF 55-60%, reduced contribution atrial contraction to ventricular filling, due to increased ventricular diastolic pressure or atrial contratile dysfunction/  bioprosthetic AV w/ mod. regurg. (valve area 1.97cm^2)/ mild dilated ascending aorta/ mild thicken MV normal function annular ring prosthesis/severe LAE & mod. to sev.RAE/ PASP 58mmHg/ mild TR/ ventricle septal motion show paradox   TRANSURETHRAL RESECTION OF BLADDER TUMOR N/A 02/13/2016   Procedure: TRANSURETHRAL RESECTION OF BLADDER TUMOR (TURBT);  Surgeon: Nickie Retort, MD;  Location: [facility];  Service: Urology;  Laterality: N/A;   TRANSURETHRAL RESECTION OF BLADDER TUMOR N/A 09/04/2016   Procedure: TRANSURETHRAL RESECTION OF BLADDER TUMOR (TURBT);  Surgeon: Nickie Retort, MD;  Location: [facility];  Service: Urology;  Laterality: N/A;    There were no vitals filed for this visit.   Subjective Assessment - 03/29/20 0845    Subjective "Ok,Ok" No stumbles or falls    Currently in Pain? No/denies                             [facility] Adult PT Treatment/Exercise - 03/29/20 0001      High Level Balance   High Level Balance Comments balance beam in parallel bars,side step and tandem walking, in parrallel bars SLS with some UE assist 3x10 sec each, Side step on and off airex       Knee/Hip Exercises: Aerobic   Nustep L 5 6 min       Knee/Hip Exercises: Machines for Strengthening   Cybex Knee Extension 10lb 3x10     Cybex Knee Flexion 35# 3x10    Cybex Leg Press 40# 3x10      Knee/Hip Exercises: Standing   Other Standing Knee Exercises Standing march on airex 2x10                     PT Short Term Goals - 02/11/20 0920      PT SHORT TERM GOAL #1   Title Pt will be I and compliant with initial HEP.    Status Achieved             PT Long Term Goals - 03/24/20 8527      PT LONG TERM GOAL #2   Title Pt will improv BERG balance score >50 to show improved  balance    Status On-going      PT LONG TERM GOAL #3   Title decrease TUG time to 12 seconds    Status Achieved                 Plan - 03/29/20 0926    Clinical Impression Statement Pt did well with all machine level interventions.He continues to have some instability with non compliant surfaces. Constant posterior lean with side steps on balance beams needing assist to correct. He reports some difficulty at home when standing from sitting after little while, needing to gather himself before taking a step.    Stability/Clinical Decision Making Stable/Uncomplicated    Rehab Potential Good    PT Frequency 2x / week    PT Treatment/Interventions ADLs/Self Care Home Management;Stair training;Functional mobility training;Therapeutic activities;Gait training;Neuromuscular re-education;Balance training;Therapeutic exercise;Patient/family education    PT Next Visit Plan continue to work on his balance           Patient will benefit from skilled therapeutic intervention in order to improve the following deficits and impairments:  Abnormal gait, Decreased range of motion, Difficulty walking, Decreased balance, Decreased strength  Visit Diagnosis: Other abnormalities of gait and mobility  Difficulty in walking, not elsewhere classified     Problem List Patient Active Problem List   Diagnosis Date Noted   History of mitral valve repair  11/26/2018   Prosthetic valve dysfunction 11/25/2018   Ectatic abdominal aorta ([place]) 11/25/2018   S/P valve-in-valve TAVR 11/25/2018   History of bladder cancer    History of pulmonary embolus (PE)    Acute on chronic diastolic heart failure ([place]) 04/23/2018   History of aortic valve replacement 04/23/2018   Atrial fibrillation ([place]) 04/23/2018   Coronary atherosclerosis of native coronary artery 02/25/2014   Hyperlipidemia 02/25/2014   Embolism and thrombosis ([place]) 03/11/2013    Scot Jun, PTA 03/29/2020, 9:29 AM  [place]. [place], [name], [address] Phone: [phone]   Fax:  [phone]  Name: Eric [name] MRN: [id number] Date of Birth: 1929-08-10

## 2020-03-29 NOTE — Addendum Note (Signed)
Addended by: Sumner Boast on: 03/29/2020 10:07 AM   Modules accepted: Orders

## 2020-03-31 ENCOUNTER — Encounter: Payer: Self-pay | Admitting: Physical Therapy

## 2020-03-31 ENCOUNTER — Other Ambulatory Visit: Payer: Self-pay

## 2020-03-31 ENCOUNTER — Other Ambulatory Visit: Payer: Self-pay | Admitting: Interventional Cardiology

## 2020-03-31 ENCOUNTER — Ambulatory Visit: Payer: Medicare Other | Admitting: Physical Therapy

## 2020-03-31 DIAGNOSIS — R2689 Other abnormalities of gait and mobility: Secondary | ICD-10-CM | POA: Diagnosis not present

## 2020-03-31 DIAGNOSIS — R262 Difficulty in walking, not elsewhere classified: Secondary | ICD-10-CM

## 2020-03-31 NOTE — Therapy (Signed)
[place]. [place], [name], [address] Phone: [phone]   Fax:  [phone]  Physical Therapy Treatment  Patient Details  Name: Eric [name] MRN: [id number] Date of Birth: 1930-05-03 Referring Provider (PT): [place]   Encounter Date: 03/31/2020   PT End of Session - 03/31/20 0923    Visit Number 15    Date for PT Re-Evaluation 04/28/20    PT Start Time 0845    PT Stop Time 0928    PT Time Calculation (min) 43 min    Activity Tolerance Patient tolerated treatment well    Behavior During Therapy [facility] for tasks assessed/performed           Past Medical History:  Diagnosis Date  . Atrial flutter, paroxysmal ([place])    a. dx 04-02-2014, s/p  successful cardioversion 04-06-2014.  Marland Kitchen Chronic anticoagulation    on Eliquis--  due to recurrent dvt's and pe's  . Dyslipidemia   . History of bladder cancer urologist-  dr [name]   02-13-2016  s/p TURBT per path high grade papillary urothelial carcinoma  . History of DVT (deep vein thrombosis)    2000-- RLE  . History of melanoma excision    left flank;  07/ 2014 left lower leg and right upper chest  . History of prostate cancer    Gleason 6--  s/p  radical prostatectomy 06/ 2000 in [place]---  no recurrence  . History of pulmonary embolus (PE)    2000 & 2005  bilateral  . Hx of valvuloplasty 06/19/2007   a. s/p mitral ring annuloplasty, repair ruptured chordae of P2 flail segments of MV  . S/P aortic valve replacement with bioprosthetic valve    06-19-2007  severe aortic valve stenosis  . 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 Single vessel coronary artery disease   cardiologist-  dr Irish Lack   a. 05/2007 CABG x 1: s/p SVG-OM;  b. 10/2010 Ex MV: EF 72%, inf attenuation w/o ischemia, brief run of PAT with exercise. To  . Thrombocytopenia ([place])   . Thyroid goiter 2013   nodular  . Wears hearing aid    BILATERAL  . Wears partial dentures    UPPER    Past  Surgical History:  Procedure Laterality Date  . AORTIC VALVE REPLACEMENT (AVR)/CORONARY ARTERY BYPASS GRAFTING (CABG)  06-19-2007  dr [name]   SVG to OM1 ;   AVR w/ #23 Mitralflow pericardial and ligation left atrial appendage;  MV repair w/ #28 Sorin 3-D memo Ring Annuloplasty with repair ruptured chordar of P-2 flail segments  . CARDIAC CATHETERIZATION  05-12-2007  dr Leonia Reeves   single vessel 30% ostial LAD,  80% LCx;  severe to critial AS,  moderate MR,  normal LVF, ef 70%,  normal right heart pressures and cardiac outputs,  mild elevated LV end-diastolic pressure    . CARDIOVASCULAR STRESS TEST  11-02-2010  dr Irish Lack   normal nuclear study w/ no ischemia or infarct/scar/  normal LV function and wall motion , ef 72%  . CARDIOVERSION N/A 04/06/2014   Procedure: CARDIOVERSION;  Surgeon: [name], MD;  Location: [place];  Service: Cardiovascular;  Laterality: N/A;   successful  . CATARACT EXTRACTION W/ INTRAOCULAR LENS  IMPLANT, BILATERAL  2012  approx  . CORONARY ARTERY BYPASS GRAFT    . RETROPUBIC RADICAL PROSTATECTOMY  06/ 2000  in [place]  . RIGHT/LEFT HEART CATH AND CORONARY/GRAFT ANGIOGRAPHY N/A 10/29/2018   Procedure: RIGHT/LEFT HEART CATH AND CORONARY/GRAFT  ANGIOGRAPHY;  Surgeon: [name], MD;  Location: [place] CV LAB;  Service: Cardiovascular;  Laterality: N/A;  . ROTATOR CUFF REPAIR Left 09/1998  . TEE WITHOUT CARDIOVERSION N/A 04/06/2014   Procedure: TRANSESOPHAGEAL ECHOCARDIOGRAM (TEE);  Surgeon: [name], MD;  Location: [facility] ENDOSCOPY;  Service: Cardiovascular;  Laterality: N/A;  mild focal basa LVH of the septum, ef 50-55%, s/p AV annuloplasty ring, elongated chordae, thickened leaflets, mild to mod. MR, multiple small jets/arotic bioprosthetic valve sits well in position, mild AI/ thickened TV w/ mild reurg./mild LA  . TEE WITHOUT CARDIOVERSION N/A 10/22/2018   Procedure: TRANSESOPHAGEAL ECHOCARDIOGRAM (TEE);  Surgeon: [name], MD;   Location: [facility] ENDOSCOPY;  Service: Cardiovascular;  Laterality: N/A;  . TEE WITHOUT CARDIOVERSION N/A 11/25/2018   Procedure: TRANSESOPHAGEAL ECHOCARDIOGRAM (TEE);  Surgeon: [name], MD;  Location: [place];  Service: Open Heart Surgery;  Laterality: N/A;  . TRANSCATHETER AORTIC VALVE REPLACEMENT, TRANSFEMORAL N/A 11/25/2018   Procedure: TRANSCATHETER AORTIC VALVE REPLACEMENT, TRANSFEMORAL;  Surgeon: [name], MD;  Location: [place];  Service: Open Heart Surgery;  Laterality: N/A;  . TRANSTHORACIC ECHOCARDIOGRAM  03-23-2016   dr Irish Lack   EF 55-60%, reduced contribution atrial contraction to ventricular filling, due to increased ventricular diastolic pressure or atrial contratile dysfunction/  bioprosthetic AV w/ mod. regurg. (valve area 1.97cm^2)/ mild dilated ascending aorta/ mild thicken MV normal function annular ring prosthesis/severe LAE & mod. to sev.RAE/ PASP 2mmHg/ mild TR/ ventricle septal motion show paradox  . TRANSURETHRAL RESECTION OF BLADDER TUMOR N/A 02/13/2016   Procedure: TRANSURETHRAL RESECTION OF BLADDER TUMOR (TURBT);  Surgeon: Nickie Retort, MD;  Location: [facility];  Service: Urology;  Laterality: N/A;  . TRANSURETHRAL RESECTION OF BLADDER TUMOR N/A 09/04/2016   Procedure: TRANSURETHRAL RESECTION OF BLADDER TUMOR (TURBT);  Surgeon: Nickie Retort, MD;  Location: [facility];  Service: Urology;  Laterality: N/A;    There were no vitals filed for this visit.   Subjective Assessment - 03/31/20 0847    Subjective "Good"    Currently in Pain? No/denies                             [facility] Adult PT Treatment/Exercise - 03/31/20 0001      High Level Balance   High Level Balance Comments side seps over foam roll onto airex 2x5,      Neuro Re-ed    Neuro Re-ed Details  On airex sit to stand x10 then x 10 with ball toss       Knee/Hip Exercises: Aerobic   Recumbent Bike L2 x 4 min     Nustep L 5 6 min       Knee/Hip Exercises: Machines for Strengthening   Cybex Knee Extension 15lb 2x10    Cybex Knee Flexion 45# 2x10      Knee/Hip Exercises: Standing   Heel Raises Both;2 sets;10 reps;2 seconds    Other Standing Knee Exercises On aires posterior lean into therapist working on coming forward.                     PT Short Term Goals - 02/11/20 0920      PT SHORT TERM GOAL #1   Title Pt will be I and compliant with initial HEP.    Status Achieved             PT Long Term Goals - 03/24/20 4196  PT LONG TERM GOAL #2   Title Pt will improv BERG balance score >50 to show improved balance    Status On-going      PT LONG TERM GOAL #3   Title decrease TUG time to 12 seconds    Status Achieved                 Plan - 03/31/20 0924    Clinical Impression Statement Pt did well completing all interventions. Increase resistance tolerated with leg curls and extensions. She had some difficulty with non compliant surfaces requiring min assist to maintain balance at times. Lateral ball tosses with sit to stands were most difficult    Stability/Clinical Decision Making Stable/Uncomplicated    Rehab Potential Good    PT Frequency 2x / week    PT Duration 8 weeks    PT Treatment/Interventions ADLs/Self Care Home Management;Stair training;Functional mobility training;Therapeutic activities;Gait training;Neuromuscular re-education;Balance training;Therapeutic exercise;Patient/family education    PT Next Visit Plan continue to work on his balance           Patient will benefit from skilled therapeutic intervention in order to improve the following deficits and impairments:  Abnormal gait, Decreased range of motion, Difficulty walking, Decreased balance, Decreased strength  Visit Diagnosis: Difficulty in walking, not elsewhere classified  Other abnormalities of gait and mobility     Problem List Patient Active Problem List   Diagnosis Date Noted  . History of mitral  valve repair 11/26/2018  . Prosthetic valve dysfunction 11/25/2018  . Ectatic abdominal aorta ([place]) 11/25/2018  . 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 History of bladder cancer   . History of pulmonary embolus (PE)   . Acute on chronic diastolic heart failure ([place]) 04/23/2018  . History of aortic valve replacement 04/23/2018  . Atrial fibrillation ([place]) 04/23/2018  . Coronary atherosclerosis of native coronary artery 02/25/2014  . Hyperlipidemia 02/25/2014  . Embolism and thrombosis ([place]) 03/11/2013    Scot Jun, PTA 03/31/2020, 9:26 AM  [place]. [place], [name], [address] Phone: [phone]   Fax:  [phone]  Name: Eric [name] MRN: [id number] Date of Birth: [birth year]

## 2020-04-03 ENCOUNTER — Other Ambulatory Visit: Payer: Self-pay | Admitting: Interventional Cardiology

## 2020-04-05 ENCOUNTER — Ambulatory Visit: Payer: Medicare Other | Admitting: Physical Therapy

## 2020-04-05 ENCOUNTER — Other Ambulatory Visit: Payer: Self-pay

## 2020-04-05 ENCOUNTER — Encounter: Payer: Self-pay | Admitting: Physical Therapy

## 2020-04-05 DIAGNOSIS — R2689 Other abnormalities of gait and mobility: Secondary | ICD-10-CM | POA: Diagnosis not present

## 2020-04-05 DIAGNOSIS — R262 Difficulty in walking, not elsewhere classified: Secondary | ICD-10-CM

## 2020-04-05 NOTE — Therapy (Signed)
[place]. [place], [name], [address] Phone: [phone]   Fax:  [phone]  Physical Therapy Treatment  Patient Details  Name: Eric [name] MRN: [id number] Date of Birth: 04/24/1930 Referring Provider (PT): [place]   Encounter Date: 04/05/2020   PT End of Session - 04/05/20 1444    Visit Number 16    Date for PT Re-Evaluation 04/28/20    PT Start Time 1310    PT Stop Time 6546    PT Time Calculation (min) 45 min    Activity Tolerance Patient tolerated treatment well    Behavior During Therapy [facility] for tasks assessed/performed           Past Medical History:  Diagnosis Date  . Atrial flutter, paroxysmal ([place])    a. dx 04-02-2014, s/p  successful cardioversion 04-06-2014.  Marland Kitchen Chronic anticoagulation    on Eliquis--  due to recurrent dvt's and pe's  . Dyslipidemia   . History of bladder cancer urologist-  dr [name]   02-13-2016  s/p TURBT per path high grade papillary urothelial carcinoma  . History of DVT (deep vein thrombosis)    2000-- RLE  . History of melanoma excision    left flank;  07/ 2014 left lower leg and right upper chest  . History of prostate cancer    Gleason 6--  s/p  radical prostatectomy 06/ 2000 in [place]---  no recurrence  . History of pulmonary embolus (PE)    2000 & 2005  bilateral  . Hx of valvuloplasty 06/19/2007   a. s/p mitral ring annuloplasty, repair ruptured chordae of P2 flail segments of MV  . S/P aortic valve replacement with bioprosthetic valve    06-19-2007  severe aortic valve stenosis  . 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 Single vessel coronary artery disease   cardiologist-  dr Irish Lack   a. 05/2007 CABG x 1: s/p SVG-OM;  b. 10/2010 Ex MV: EF 72%, inf attenuation w/o ischemia, brief run of PAT with exercise. To  . Thrombocytopenia ([place])   . Thyroid goiter 2013   nodular  . Wears hearing aid    BILATERAL  . Wears partial dentures    UPPER    Past  Surgical History:  Procedure Laterality Date  . AORTIC VALVE REPLACEMENT (AVR)/CORONARY ARTERY BYPASS GRAFTING (CABG)  06-19-2007  dr [name]   SVG to OM1 ;   AVR w/ #23 Mitralflow pericardial and ligation left atrial appendage;  MV repair w/ #28 Sorin 3-D memo Ring Annuloplasty with repair ruptured chordar of P-2 flail segments  . CARDIAC CATHETERIZATION  05-12-2007  dr Leonia Reeves   single vessel 30% ostial LAD,  80% LCx;  severe to critial AS,  moderate MR,  normal LVF, ef 70%,  normal right heart pressures and cardiac outputs,  mild elevated LV end-diastolic pressure    . CARDIOVASCULAR STRESS TEST  11-02-2010  dr Irish Lack   normal nuclear study w/ no ischemia or infarct/scar/  normal LV function and wall motion , ef 72%  . CARDIOVERSION N/A 04/06/2014   Procedure: CARDIOVERSION;  Surgeon: [name], MD;  Location: [place];  Service: Cardiovascular;  Laterality: N/A;   successful  . CATARACT EXTRACTION W/ INTRAOCULAR LENS  IMPLANT, BILATERAL  2012  approx  . CORONARY ARTERY BYPASS GRAFT    . RETROPUBIC RADICAL PROSTATECTOMY  06/ 2000  in [place], [place]  . RIGHT/LEFT HEART CATH AND CORONARY/GRAFT ANGIOGRAPHY N/A 10/29/2018   Procedure: RIGHT/LEFT HEART CATH AND CORONARY/GRAFT  ANGIOGRAPHY;  Surgeon: [name], MD;  Location: [place] CV LAB;  Service: Cardiovascular;  Laterality: N/A;  . ROTATOR CUFF REPAIR Left 09/1998  . TEE WITHOUT CARDIOVERSION N/A 04/06/2014   Procedure: TRANSESOPHAGEAL ECHOCARDIOGRAM (TEE);  Surgeon: [name], MD;  Location: [facility] ENDOSCOPY;  Service: Cardiovascular;  Laterality: N/A;  mild focal basa LVH of the septum, ef 50-55%, s/p AV annuloplasty ring, elongated chordae, thickened leaflets, mild to mod. MR, multiple small jets/arotic bioprosthetic valve sits well in position, mild AI/ thickened TV w/ mild reurg./mild LA  . TEE WITHOUT CARDIOVERSION N/A 10/22/2018   Procedure: TRANSESOPHAGEAL ECHOCARDIOGRAM (TEE);  Surgeon: [name], MD;   Location: [facility] ENDOSCOPY;  Service: Cardiovascular;  Laterality: N/A;  . TEE WITHOUT CARDIOVERSION N/A 11/25/2018   Procedure: TRANSESOPHAGEAL ECHOCARDIOGRAM (TEE);  Surgeon: [name], MD;  Location: [place];  Service: Open Heart Surgery;  Laterality: N/A;  . TRANSCATHETER AORTIC VALVE REPLACEMENT, TRANSFEMORAL N/A 11/25/2018   Procedure: TRANSCATHETER AORTIC VALVE REPLACEMENT, TRANSFEMORAL;  Surgeon: [name], MD;  Location: [facility];  Service: Open Heart Surgery;  Laterality: N/A;  . TRANSTHORACIC ECHOCARDIOGRAM  03-23-2016   dr Irish Lack   EF 55-60%, reduced contribution atrial contraction to ventricular filling, due to increased ventricular diastolic pressure or atrial contratile dysfunction/  bioprosthetic AV w/ mod. regurg. (valve area 1.97cm^2)/ mild dilated ascending aorta/ mild thicken MV normal function annular ring prosthesis/severe LAE & mod. to sev.RAE/ PASP 56mmHg/ mild TR/ ventricle septal motion show paradox  . TRANSURETHRAL RESECTION OF BLADDER TUMOR N/A 02/13/2016   Procedure: TRANSURETHRAL RESECTION OF BLADDER TUMOR (TURBT);  Surgeon: Nickie Retort, MD;  Location: [facility];  Service: Urology;  Laterality: N/A;  . TRANSURETHRAL RESECTION OF BLADDER TUMOR N/A 09/04/2016   Procedure: TRANSURETHRAL RESECTION OF BLADDER TUMOR (TURBT);  Surgeon: Nickie Retort, MD;  Location: [facility];  Service: Urology;  Laterality: N/A;    There were no vitals filed for this visit.   Subjective Assessment - 04/05/20 1309    Subjective Pretty good, no falls.    Currently in Pain? No/denies                             [facility] Adult PT Treatment/Exercise - 04/05/20 0001      Ambulation/Gait   Gait Comments gait outside       High Level Balance   High Level Balance Activities Backward walking;Direction changes;Tandem walking;Negotitating around obstacles;Negotiating over obstacles    High Level Balance Comments side seps over foam  roll onto airex 2x5,, use of airex for tandem walk      Knee/Hip Exercises: Machines for Strengthening   Cybex Knee Extension 15lb 2x10    Cybex Knee Flexion 45# 2x10 this was heavy for him, decreased to 35# on hte second set                    PT Short Term Goals - 02/11/20 0920      PT SHORT TERM GOAL #1   Title Pt will be I and compliant with initial HEP.    Status Achieved             PT Long Term Goals - 04/05/20 1446      PT LONG TERM GOAL #4   Title increase knee and ankle strength to 4+/5    Status Achieved  Plan - 04/05/20 1444    Clinical Impression Statement Patient had loss of balance with tandem and tandem on airex, had loss of balance when on the airex and stepping on and off over a bolster, he had a big step reaction with this and was able to right himself, I did some more of the back to the wall small ankle and hip streategies recovery    PT Next Visit Plan continue to work on his balance    Consulted and Agree with Plan of Care Patient           Patient will benefit from skilled therapeutic intervention in order to improve the following deficits and impairments:  Abnormal gait, Decreased range of motion, Difficulty walking, Decreased balance, Decreased strength  Visit Diagnosis: Difficulty in walking, not elsewhere classified  Other abnormalities of gait and mobility     Problem List Patient Active Problem List   Diagnosis Date Noted  . History of mitral valve repair 11/26/2018  . Prosthetic valve dysfunction 11/25/2018  . Ectatic abdominal aorta ([place]) 11/25/2018  . 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 History of bladder cancer   . History of pulmonary embolus (PE)   . Acute on chronic diastolic heart failure ([place]) 04/23/2018  . History of aortic valve replacement 04/23/2018  . Atrial fibrillation ([place]) 04/23/2018  . Coronary atherosclerosis of native coronary artery 02/25/2014  . Hyperlipidemia 02/25/2014   . Embolism and thrombosis ([place]) 03/11/2013    Sumner Boast., PT 04/05/2020, 2:59 PM  [place]. [place], [name], [address] Phone: [phone]   Fax:  [phone]  Name: TORRI LANGSTON MRN: [id number] Date of Birth: 1930/03/24

## 2020-04-07 ENCOUNTER — Encounter: Payer: Self-pay | Admitting: Physical Therapy

## 2020-04-07 ENCOUNTER — Ambulatory Visit: Payer: Medicare Other | Admitting: Physical Therapy

## 2020-04-07 ENCOUNTER — Other Ambulatory Visit: Payer: Self-pay

## 2020-04-07 DIAGNOSIS — R2689 Other abnormalities of gait and mobility: Secondary | ICD-10-CM | POA: Diagnosis not present

## 2020-04-07 DIAGNOSIS — R262 Difficulty in walking, not elsewhere classified: Secondary | ICD-10-CM

## 2020-04-07 NOTE — Therapy (Signed)
[place] [place], [name], [address] Phone: [phone]   Fax:  [phone]  Physical Therapy Treatment  Patient Details  Name: Eric [name] MRN: [id number] Date of Birth: 02-19-1930 Referring Provider (PT): [place]   Encounter Date: 04/07/2020   PT End of Session - 04/07/20 1702    Visit Number 17    Date for PT Re-Evaluation 04/28/20    PT Start Time 1521    PT Stop Time 1605    PT Time Calculation (min) 44 min    Activity Tolerance Patient tolerated treatment well    Behavior During Therapy [facility] for tasks assessed/performed           Past Medical History:  Diagnosis Date  . Atrial flutter, paroxysmal ([place])    a. dx 04-02-2014, s/p  successful cardioversion 04-06-2014.  Marland Kitchen Chronic anticoagulation    on Eliquis--  due to recurrent dvt's and pe's  . Dyslipidemia   . History of bladder cancer urologist-  dr [name]   02-13-2016  s/p TURBT per path high grade papillary urothelial carcinoma  . History of DVT (deep vein thrombosis)    2000-- RLE  . History of melanoma excision    left flank;  07/ 2014 left lower leg and right upper chest  . History of prostate cancer    Gleason 6--  s/p  radical prostatectomy 06/ 2000 in [place]---  no recurrence  . History of pulmonary embolus (PE)    2000 & 2005  bilateral  . Hx of valvuloplasty 06/19/2007   a. s/p mitral ring annuloplasty, repair ruptured chordae of P2 flail segments of MV  . S/P aortic valve replacement with bioprosthetic valve    06-19-2007  severe aortic valve stenosis  . 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 Single vessel coronary artery disease   cardiologist-  dr Irish Lack   a. 05/2007 CABG x 1: s/p SVG-OM;  b. 10/2010 Ex MV: EF 72%, inf attenuation w/o ischemia, brief run of PAT with exercise. To  . Thrombocytopenia ([place])   . Thyroid goiter 2013   nodular  . Wears hearing aid    BILATERAL  . Wears partial dentures    UPPER    Past  Surgical History:  Procedure Laterality Date  . AORTIC VALVE REPLACEMENT (AVR)/CORONARY ARTERY BYPASS GRAFTING (CABG)  06-19-2007  dr [name]   SVG to OM1 ;   AVR w/ #23 Mitralflow pericardial and ligation left atrial appendage;  MV repair w/ #28 Sorin 3-D memo Ring Annuloplasty with repair ruptured chordar of P-2 flail segments  . CARDIAC CATHETERIZATION  05-12-2007  dr Leonia Reeves   single vessel 30% ostial LAD,  80% LCx;  severe to critial AS,  moderate MR,  normal LVF, ef 70%,  normal right heart pressures and cardiac outputs,  mild elevated LV end-diastolic pressure    . CARDIOVASCULAR STRESS TEST  11-02-2010  dr Irish Lack   normal nuclear study w/ no ischemia or infarct/scar/  normal LV function and wall motion , ef 72%  . CARDIOVERSION N/A 04/06/2014   Procedure: CARDIOVERSION;  Surgeon: [name], MD;  Location: [place];  Service: Cardiovascular;  Laterality: N/A;   successful  . CATARACT EXTRACTION W/ INTRAOCULAR LENS  IMPLANT, BILATERAL  2012  approx  . CORONARY ARTERY BYPASS GRAFT    . RETROPUBIC RADICAL PROSTATECTOMY  06/ 2000  in [place], [place]  . RIGHT/LEFT HEART CATH AND CORONARY/GRAFT ANGIOGRAPHY N/A 10/29/2018   Procedure: RIGHT/LEFT HEART CATH AND CORONARY/GRAFT  ANGIOGRAPHY;  Surgeon: [name], MD;  Location: [place] CV LAB;  Service: Cardiovascular;  Laterality: N/A;  . ROTATOR CUFF REPAIR Left 09/1998  . TEE WITHOUT CARDIOVERSION N/A 04/06/2014   Procedure: TRANSESOPHAGEAL ECHOCARDIOGRAM (TEE);  Surgeon: [name], MD;  Location: [facility] ENDOSCOPY;  Service: Cardiovascular;  Laterality: N/A;  mild focal basa LVH of the septum, ef 50-55%, s/p AV annuloplasty ring, elongated chordae, thickened leaflets, mild to mod. MR, multiple small jets/arotic bioprosthetic valve sits well in position, mild AI/ thickened TV w/ mild reurg./mild LA  . TEE WITHOUT CARDIOVERSION N/A 10/22/2018   Procedure: TRANSESOPHAGEAL ECHOCARDIOGRAM (TEE);  Surgeon: [name], MD;   Location: [facility] ENDOSCOPY;  Service: Cardiovascular;  Laterality: N/A;  . TEE WITHOUT CARDIOVERSION N/A 11/25/2018   Procedure: TRANSESOPHAGEAL ECHOCARDIOGRAM (TEE);  Surgeon: [name], MD;  Location: [place];  Service: Open Heart Surgery;  Laterality: N/A;  . TRANSCATHETER AORTIC VALVE REPLACEMENT, TRANSFEMORAL N/A 11/25/2018   Procedure: TRANSCATHETER AORTIC VALVE REPLACEMENT, TRANSFEMORAL;  Surgeon: [name], MD;  Location: [place];  Service: Open Heart Surgery;  Laterality: N/A;  . TRANSTHORACIC ECHOCARDIOGRAM  03-23-2016   dr Irish Lack   EF 55-60%, reduced contribution atrial contraction to ventricular filling, due to increased ventricular diastolic pressure or atrial contratile dysfunction/  bioprosthetic AV w/ mod. regurg. (valve area 1.97cm^2)/ mild dilated ascending aorta/ mild thicken MV normal function annular ring prosthesis/severe LAE & mod. to sev.RAE/ PASP 19mHg/ mild TR/ ventricle septal motion show paradox  . TRANSURETHRAL RESECTION OF BLADDER TUMOR N/A 02/13/2016   Procedure: TRANSURETHRAL RESECTION OF BLADDER TUMOR (TURBT);  Surgeon: [name] MD;  [facility]  Service: Urology;  Laterality: N/A;  . TRANSURETHRAL RESECTION OF BLADDER TUMOR N/A 09/04/2016   Procedure: TRANSURETHRAL RESECTION OF BLADDER TUMOR (TURBT);  Surgeon: [name] MD;  [facility]  Service: Urology;  Laterality: N/A;    There were no vitals filed for this visit.   Subjective Assessment - 04/07/20 1532    Subjective When I lose my balance I can catch myself    Currently in Pain? No/denies                             [facility] Adult PT Treatment/Exercise - 04/07/20 0001      Ambulation/Gait   Gait Comments gait outside on leaves, grass, slopes, uneven terrain, stepping over curbs, on and off curbs.      High Level Balance   High Level Balance Activities Direction changes;Negotitating around obstacles;Negotiating  over obstacles    High Level Balance Comments side steps over foam roll onto airex 2x5,, use of airex for tandem walk, on airex ball toss, on airex eyes closed and head turns, ball kicks      Knee/Hip Exercises: Aerobic   Nustep L 6 5 min                    PT Short Term Goals - 02/11/20 0920      PT SHORT TERM GOAL #1   Title Pt will be I and compliant with initial HEP.    Status Achieved             PT Long Term Goals - 04/07/20 1708      PT LONG TERM GOAL #2   Title Pt will improv BERG balance score >50 to show improved balance    Status Partially Met  Plan - 04/07/20 1706    Clinical Impression Statement Airex really still is difficult for him, he can then at times do great on higher level balance activities and then will lose his balance turning to sit in the chair, tried to do more direction changes with him to challenge this.  Did well outside but at times you can tell he has neuropathy as he will over step or have a very hard foot fall    PT Next Visit Plan advance with the balance as he tolerates    Consulted and Agree with Plan of Care Patient           Patient will benefit from skilled therapeutic intervention in order to improve the following deficits and impairments:  Abnormal gait, Decreased range of motion, Difficulty walking, Decreased balance, Decreased strength  Visit Diagnosis: Difficulty in walking, not elsewhere classified  Other abnormalities of gait and mobility     Problem List Patient Active Problem List   Diagnosis Date Noted  . History of mitral valve repair 11/26/2018  . Prosthetic valve dysfunction 11/25/2018  . Ectatic abdominal aorta ([place]) 11/25/2018  . 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 History of bladder cancer   . History of pulmonary embolus (PE)   . Acute on chronic diastolic heart failure ([place]) 04/23/2018  . History of aortic valve replacement 04/23/2018  . Atrial fibrillation ([place])  04/23/2018  . Coronary atherosclerosis of native coronary artery 02/25/2014  . Hyperlipidemia 02/25/2014  . Embolism and thrombosis ([place]) 03/11/2013    Sumner Boast., PT 04/07/2020, 5:09 PM  [place] [place]. [place], [name], [address] Phone: [phone]   Fax:  [phone]  Name: REX OESTERLE MRN: [id number] Date of Birth: Jan 20, 1930

## 2020-04-12 ENCOUNTER — Encounter: Payer: Self-pay | Admitting: Physical Therapy

## 2020-04-12 ENCOUNTER — Ambulatory Visit: Payer: Medicare Other | Admitting: Physical Therapy

## 2020-04-12 ENCOUNTER — Other Ambulatory Visit: Payer: Self-pay

## 2020-04-12 DIAGNOSIS — R2689 Other abnormalities of gait and mobility: Secondary | ICD-10-CM

## 2020-04-12 DIAGNOSIS — R262 Difficulty in walking, not elsewhere classified: Secondary | ICD-10-CM

## 2020-04-12 NOTE — Therapy (Signed)
[place]. [place], [name], [address] Phone: [phone]   Fax:  [phone]  Physical Therapy Treatment  Patient Details  Name: Eric [name] MRN: [id number] Date of Birth: January 09, 1930 Referring Provider (PT): [place]   Encounter Date: 04/12/2020   PT End of Session - 04/12/20 0932    Visit Number 18    Date for PT Re-Evaluation 04/28/20    PT Start Time 0840    PT Stop Time 0930    PT Time Calculation (min) 50 min    Activity Tolerance Patient tolerated treatment well    Behavior During Therapy [facility] for tasks assessed/performed           Past Medical History:  Diagnosis Date   Atrial flutter, paroxysmal ([place])    a. dx 04-02-2014, s/p  successful cardioversion 04-06-2014.   Chronic anticoagulation    on Eliquis--  due to recurrent dvt's and pe's   Dyslipidemia    History of bladder cancer urologist-  dr [name]   02-13-2016  s/p TURBT per path high grade papillary urothelial carcinoma   History of DVT (deep vein thrombosis)    2000-- RLE   History of melanoma excision    left flank;  07/ 2014 left lower leg and right upper chest   History of prostate cancer    Gleason 6--  s/p  radical prostatectomy 06/ 2000 in [place]---  no recurrence   History of pulmonary embolus (PE)    2000 & 2005  bilateral   Hx of valvuloplasty 06/19/2007   a. 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 05/2007 CABG x 1: s/p SVG-OM;  b. 10/2010 Ex MV: EF 72%, inf attenuation w/o ischemia, brief run of PAT with exercise. To   Thrombocytopenia ([place])    Thyroid goiter 2013   nodular   Wears hearing aid    BILATERAL   Wears partial dentures    UPPER    Past  Surgical History:  Procedure Laterality Date   AORTIC VALVE REPLACEMENT (AVR)/CORONARY ARTERY BYPASS GRAFTING (CABG)  06-19-2007  dr [name]   SVG to OM1 ;   AVR w/ #23 Mitralflow pericardial and ligation left atrial appendage;  MV repair w/ #28 Sorin 3-D memo Ring Annuloplasty with repair ruptured chordar of P-2 flail segments   CARDIAC CATHETERIZATION  05-12-2007  dr Leonia Reeves   single vessel 30% ostial LAD,  80% LCx;  severe to critial AS,  moderate MR,  normal LVF, ef 70%,  normal right heart pressures and cardiac outputs,  mild elevated LV end-diastolic pressure     CARDIOVASCULAR STRESS TEST  11-02-2010  dr Irish Lack   normal nuclear study w/ no ischemia or infarct/scar/  normal LV function and wall motion , ef 72%   CARDIOVERSION N/A 04/06/2014   Procedure: CARDIOVERSION;  Surgeon: [name], MD;  Location: [place];  Service: Cardiovascular;  Laterality: N/A;   successful   CATARACT EXTRACTION W/ INTRAOCULAR LENS  IMPLANT, BILATERAL  2012  approx   CORONARY ARTERY BYPASS GRAFT     RETROPUBIC RADICAL PROSTATECTOMY  06/ 2000  in [place]   RIGHT/LEFT HEART CATH AND CORONARY/GRAFT ANGIOGRAPHY N/A 10/29/2018   Procedure: RIGHT/LEFT HEART CATH AND CORONARY/GRAFT  ANGIOGRAPHY;  Surgeon: [name], MD;  Location: [place] CV LAB;  Service: Cardiovascular;  Laterality: N/A;   ROTATOR CUFF REPAIR Left 09/1998   TEE WITHOUT CARDIOVERSION N/A 04/06/2014   Procedure: TRANSESOPHAGEAL ECHOCARDIOGRAM (TEE);  Surgeon: [name], MD;  [facility];  Service: Cardiovascular;  Laterality: N/A;  mild focal basa LVH of the septum, ef 50-55%, s/p AV annuloplasty ring, elongated chordae, thickened leaflets, mild to mod. MR, multiple small jets/arotic bioprosthetic valve sits well in position, mild AI/ thickened TV w/ mild reurg./mild LA   TEE WITHOUT CARDIOVERSION N/A 10/22/2018   Procedure: TRANSESOPHAGEAL ECHOCARDIOGRAM (TEE);  Surgeon: [name], MD;   Location: [facility] ENDOSCOPY;  Service: Cardiovascular;  Laterality: N/A;   TEE WITHOUT CARDIOVERSION N/A 11/25/2018   Procedure: TRANSESOPHAGEAL ECHOCARDIOGRAM (TEE);  Surgeon: [name], MD;  Location: [place];  Service: Open Heart Surgery;  Laterality: N/A;   TRANSCATHETER AORTIC VALVE REPLACEMENT, TRANSFEMORAL N/A 11/25/2018   Procedure: TRANSCATHETER AORTIC VALVE REPLACEMENT, TRANSFEMORAL;  Surgeon: [name], MD;  Location: [place];  Service: Open Heart Surgery;  Laterality: N/A;   TRANSTHORACIC ECHOCARDIOGRAM  03-23-2016   dr Irish Lack   EF 55-60%, reduced contribution atrial contraction to ventricular filling, due to increased ventricular diastolic pressure or atrial contratile dysfunction/  bioprosthetic AV w/ mod. regurg. (valve area 1.97cm^2)/ mild dilated ascending aorta/ mild thicken MV normal function annular ring prosthesis/severe LAE & mod. to sev.RAE/ PASP 40mHg/ mild TR/ ventricle septal motion show paradox   TRANSURETHRAL RESECTION OF BLADDER TUMOR N/A 02/13/2016   Procedure: TRANSURETHRAL RESECTION OF BLADDER TUMOR (TURBT);  Surgeon: [name] MD;  [facility]  Service: Urology;  Laterality: N/A;   TRANSURETHRAL RESECTION OF BLADDER TUMOR N/A 09/04/2016   Procedure: TRANSURETHRAL RESECTION OF BLADDER TUMOR (TURBT);  Surgeon: [name] MD;  [facility]  Service: Urology;  Laterality: N/A;    There were no vitals filed for this visit.   Subjective Assessment - 04/12/20 0849    Subjective Been pretty good no falls    Currently in Pain? No/denies                             [facility]20 0001      High Level Balance   High Level Balance Activities Direction changes;Negotitating around obstacles;Negotiating over obstacles    High Level Balance Comments walking ball toss, outside gait uneven terrain, direction changes with ball toss, worked on getting up  from kneeling      Knee/Hip Exercises: Aerobic   Nustep L 6 5 min      Knee/Hip Exercises: Machines for Strengthening   Cybex Knee Extension 15lb 2x10    Cybex Knee Flexion 35# 3x10    Other Machine 5# hip abduction and extension                    PT Short Term Goals - 02/11/20 0920      PT SHORT TERM GOAL #1   Title Pt will be I and compliant with initial HEP.    Status Achieved             PT Long Term Goals - 04/07/20 1708      PT LONG TERM GOAL #2   Title Pt will improv BERG balance score >50 to show improved balance    Status Partially Met  Plan - 04/12/20 0932    Clinical Impression Statement Patient reported that he has difficulty getting up from the floor, he reports that he has to unplug and plug in from a floor outlet and that he has a very difficult time getting up, this was difficult for him but we doid some problem solving and work iwth him and he did multiple times and got better with this.  Did better with change of directions today without as many stumbles           Patient will benefit from skilled therapeutic intervention in order to improve the following deficits and impairments:  Abnormal gait, Decreased range of motion, Difficulty walking, Decreased balance, Decreased strength  Visit Diagnosis: Difficulty in walking, not elsewhere classified  Other abnormalities of gait and mobility     Problem List Patient Active Problem List   Diagnosis Date Noted   History of mitral valve repair 11/26/2018   Prosthetic valve dysfunction 11/25/2018   Ectatic abdominal aorta ([place]) 11/25/2018   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 [place], [name], [address] Phone: [phone]   Fax:  [phone]  Name: Eric [name] MRN: [id number] Date of Birth: 11-29-1929

## 2020-04-19 ENCOUNTER — Encounter: Payer: Self-pay | Admitting: Physical Therapy

## 2020-04-19 ENCOUNTER — Other Ambulatory Visit: Payer: Self-pay

## 2020-04-19 ENCOUNTER — Ambulatory Visit: Payer: Medicare Other | Admitting: Physical Therapy

## 2020-04-19 DIAGNOSIS — R262 Difficulty in walking, not elsewhere classified: Secondary | ICD-10-CM

## 2020-04-19 DIAGNOSIS — R2689 Other abnormalities of gait and mobility: Secondary | ICD-10-CM

## 2020-04-19 NOTE — Therapy (Signed)
[address] [place], [name], [address] Phone: [phone]   Fax:  [phone]  Physical Therapy Treatment  Patient Details  Name: Eric [name] MRN: [id number] Date of Birth: 1930-04-07 Referring Provider (PT): [place]   Encounter Date: 04/19/2020   PT End of Session - 04/19/20 1011    Visit Number 19    Date for PT Re-Evaluation 04/28/20    PT Start Time 0930    PT Stop Time 1012    PT Time Calculation (min) 42 min    Activity Tolerance Patient tolerated treatment well    Behavior During Therapy [facility] for tasks assessed/performed           Past Medical History:  Diagnosis Date  . Atrial flutter, paroxysmal ([place])    a. dx 04-02-2014, s/p  successful cardioversion 04-06-2014.  Marland Kitchen Chronic anticoagulation    on Eliquis--  due to recurrent dvt's and pe's  . Dyslipidemia   . History of bladder cancer urologist-  dr [name]   02-13-2016  s/p TURBT per path high grade papillary urothelial carcinoma  . History of DVT (deep vein thrombosis)    2000-- RLE  . History of melanoma excision    left flank;  07/ 2014 left lower leg and right upper chest  . History of prostate cancer    Gleason 6--  s/p  radical prostatectomy 06/ 2000 in [place]---  no recurrence  . History of pulmonary embolus (PE)    2000 & 2005  bilateral  . Hx of valvuloplasty 06/19/2007   a. s/p mitral ring annuloplasty, repair ruptured chordae of P2 flail segments of MV  . S/P aortic valve replacement with bioprosthetic valve    06-19-2007  severe aortic valve stenosis  . 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 Single vessel coronary artery disease   cardiologist-  dr Irish Lack   a. 05/2007 CABG x 1: s/p SVG-OM;  b. 10/2010 Ex MV: EF 72%, inf attenuation w/o ischemia, brief run of PAT with exercise. To  . Thrombocytopenia ([place])   . Thyroid goiter 2013   nodular  . Wears hearing aid    BILATERAL  . Wears partial dentures    UPPER    Past  Surgical History:  Procedure Laterality Date  . AORTIC VALVE REPLACEMENT (AVR)/CORONARY ARTERY BYPASS GRAFTING (CABG)  06-19-2007  dr [name]   SVG to OM1 ;   AVR w/ #23 Mitralflow pericardial and ligation left atrial appendage;  MV repair w/ #28 Sorin 3-D memo Ring Annuloplasty with repair ruptured chordar of P-2 flail segments  . CARDIAC CATHETERIZATION  05-12-2007  dr Leonia Reeves   single vessel 30% ostial LAD,  80% LCx;  severe to critial AS,  moderate MR,  normal LVF, ef 70%,  normal right heart pressures and cardiac outputs,  mild elevated LV end-diastolic pressure    . CARDIOVASCULAR STRESS TEST  11-02-2010  dr Irish Lack   normal nuclear study w/ no ischemia or infarct/scar/  normal LV function and wall motion , ef 72%  . CARDIOVERSION N/A 04/06/2014   Procedure: CARDIOVERSION;  Surgeon: [name], MD;  Location: [place];  Service: Cardiovascular;  Laterality: N/A;   successful  . CATARACT EXTRACTION W/ INTRAOCULAR LENS  IMPLANT, BILATERAL  2012  approx  . CORONARY ARTERY BYPASS GRAFT    . RETROPUBIC RADICAL PROSTATECTOMY  06/ 2000  in [place], [place]  . RIGHT/LEFT HEART CATH AND CORONARY/GRAFT ANGIOGRAPHY N/A 10/29/2018   Procedure: RIGHT/LEFT HEART CATH AND CORONARY/GRAFT  ANGIOGRAPHY;  Surgeon: [name], MD;  Location: [place] CV LAB;  Service: Cardiovascular;  Laterality: N/A;  . ROTATOR CUFF REPAIR Left 09/1998  . TEE WITHOUT CARDIOVERSION N/A 04/06/2014   Procedure: TRANSESOPHAGEAL ECHOCARDIOGRAM (TEE);  Surgeon: [name], MD;  [facility] ENDOSCOPY;  Service: Cardiovascular;  Laterality: N/A;  mild focal basa LVH of the septum, ef 50-55%, s/p AV annuloplasty ring, elongated chordae, thickened leaflets, mild to mod. MR, multiple small jets/arotic bioprosthetic valve sits well in position, mild AI/ thickened TV w/ mild reurg./mild LA  . TEE WITHOUT CARDIOVERSION N/A 10/22/2018   Procedure: TRANSESOPHAGEAL ECHOCARDIOGRAM (TEE);  Surgeon: [name], MD;   Location: [facility] ENDOSCOPY;  Service: Cardiovascular;  Laterality: N/A;  . TEE WITHOUT CARDIOVERSION N/A 11/25/2018   Procedure: TRANSESOPHAGEAL ECHOCARDIOGRAM (TEE);  Surgeon: [name], MD;  Location: [place];  Service: Open Heart Surgery;  Laterality: N/A;  . TRANSCATHETER AORTIC VALVE REPLACEMENT, TRANSFEMORAL N/A 11/25/2018   Procedure: TRANSCATHETER AORTIC VALVE REPLACEMENT, TRANSFEMORAL;  Surgeon: [name], MD;  Location: [place];  Service: Open Heart Surgery;  Laterality: N/A;  . TRANSTHORACIC ECHOCARDIOGRAM  03-23-2016   dr Irish Lack   EF 55-60%, reduced contribution atrial contraction to ventricular filling, due to increased ventricular diastolic pressure or atrial contratile dysfunction/  bioprosthetic AV w/ mod. regurg. (valve area 1.97cm^2)/ mild dilated ascending aorta/ mild thicken MV normal function annular ring prosthesis/severe LAE & mod. to sev.RAE/ PASP 59mHg/ mild TR/ ventricle septal motion show paradox  . TRANSURETHRAL RESECTION OF BLADDER TUMOR N/A 02/13/2016   Procedure: TRANSURETHRAL RESECTION OF BLADDER TUMOR (TURBT);  Surgeon: [name] MD;  [facility]  Service: Urology;  Laterality: N/A;  . TRANSURETHRAL RESECTION OF BLADDER TUMOR N/A 09/04/2016   Procedure: TRANSURETHRAL RESECTION OF BLADDER TUMOR (TURBT);  Surgeon: [name] MD;  [facility]  Service: Urology;  Laterality: N/A;    There were no vitals filed for this visit.   Subjective Assessment - 04/19/20 0936    Subjective Doing good no falls or stumbles    Currently in Pain? No/denies                             [facility] Adult PT Treatment/Exercise - 04/19/20 0001      Ambulation/Gait   Gait Comments gait outside on leaves, grass, slopes, uneven terrain, stepping over curbs, on and off curbs.      High Level Balance   High Level Balance Comments Cone pick up on floor then airex, worked getting up from kneeling with and  without UE support.      Knee/Hip Exercises: Aerobic   Recumbent Bike L3.5 x 6 min      Knee/Hip Exercises: Machines for Strengthening   Cybex Knee Extension 15lb 2x10    Cybex Knee Flexion 35# 3x10    Other Machine 5# hip abduction and extension      Knee/Hip Exercises: Standing   Heel Raises Both;2 sets;2 seconds;15 reps    Other Standing Knee Exercises 10lb resisted side step over body blade                    PT Short Term Goals - 02/11/20 0920      PT SHORT TERM GOAL #1   Title Pt will be I and compliant with initial HEP.    Status Achieved             PT Long  Term Goals - 04/07/20 1708      PT LONG TERM GOAL #2   Title Pt will improv BERG balance score >50 to show improved balance    Status Partially Met                 Plan - 04/19/20 1011    Clinical Impression Statement Pt able to progress today getting up from kneeling without UE support on a surface. Gait speed did increase with down hill ambulation, he can correct this some with cues. Bilateral foot slap noted with gait. Some instability with cone pick up standing on airex.    Stability/Clinical Decision Making Stable/Uncomplicated    Rehab Potential Good    PT Frequency 2x / week    PT Duration 8 weeks    PT Treatment/Interventions ADLs/Self Care Home Management;Stair training;Functional mobility training;Therapeutic activities;Gait training;Neuromuscular re-education;Balance training;Therapeutic exercise;Patient/family education    PT Next Visit Plan advance with the balance as he tolerates           Patient will benefit from skilled therapeutic intervention in order to improve the following deficits and impairments:  Abnormal gait, Decreased range of motion, Difficulty walking, Decreased balance, Decreased strength  Visit Diagnosis: Other abnormalities of gait and mobility  Difficulty in walking, not elsewhere classified     Problem List Patient Active Problem List    Diagnosis Date Noted  . History of mitral valve repair 11/26/2018  . Prosthetic valve dysfunction 11/25/2018  . Ectatic abdominal aorta ([place]) 11/25/2018  . 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 History of bladder cancer   . History of pulmonary embolus (PE)   . Acute on chronic diastolic heart failure ([place]) 04/23/2018  . History of aortic valve replacement 04/23/2018  . Atrial fibrillation ([place]) 04/23/2018  . Coronary atherosclerosis of native coronary artery 02/25/2014  . Hyperlipidemia 02/25/2014  . Embolism and thrombosis ([place]) 03/11/2013    Scot Jun, PTA 04/19/2020, 10:14 AM  [address]. [place], [name], [address] Phone: [phone]   Fax:  [phone]  Name: Eric [name] MRN: [id number] Date of Birth: [birth year]

## 2020-04-27 ENCOUNTER — Encounter: Payer: Self-pay | Admitting: Physical Therapy

## 2020-04-27 ENCOUNTER — Ambulatory Visit: Payer: Medicare Other | Attending: Physician Assistant | Admitting: Physical Therapy

## 2020-04-27 ENCOUNTER — Other Ambulatory Visit: Payer: Self-pay

## 2020-04-27 DIAGNOSIS — R2689 Other abnormalities of gait and mobility: Secondary | ICD-10-CM | POA: Diagnosis present

## 2020-04-27 DIAGNOSIS — R262 Difficulty in walking, not elsewhere classified: Secondary | ICD-10-CM | POA: Insufficient documentation

## 2020-04-27 NOTE — Therapy (Signed)
[address]. [place], [name], [address] Phone: [phone]   Fax:  [phone] Progress Note Reporting Period 03/22/20 to 04/27/20 for visits 11-20  See note below for Objective Data and Assessment of Progress/Goals.      Physical Therapy Treatment  Patient Details  Name: Eric [name] MRN: [id number] Date of Birth: [birth year] Referring Provider (PT): [place]   Encounter Date: 04/27/2020   PT End of Session - 04/27/20 0918    Visit Number 20    Date for PT Re-Evaluation 04/28/20    PT Start Time 0845    PT Stop Time 0928    PT Time Calculation (min) 43 min    Activity Tolerance Patient tolerated treatment well    Behavior During Therapy [facility] for tasks assessed/performed           Past Medical History:  Diagnosis Date  . Atrial flutter, paroxysmal ([place])    a. dx 04-02-2014, s/p  successful cardioversion 04-06-2014.  Marland Kitchen Chronic anticoagulation    on Eliquis--  due to recurrent dvt's and pe's  . Dyslipidemia   . History of bladder cancer urologist-  dr [name]   02-13-2016  s/p TURBT per path high grade papillary urothelial carcinoma  . History of DVT (deep vein thrombosis)    2000-- RLE  . History of melanoma excision    left flank;  07/ 2014 left lower leg and right upper chest  . History of prostate cancer    Gleason 6--  s/p  radical prostatectomy 06/ 2000 in [place]---  no recurrence  . History of pulmonary embolus (PE)    2000 & 2005  bilateral  . Hx of valvuloplasty 06/19/2007   a. s/p mitral ring annuloplasty, repair ruptured chordae of P2 flail segments of MV  . S/P aortic valve replacement with bioprosthetic valve    06-19-2007  severe aortic valve stenosis  . 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 Single vessel coronary artery disease   cardiologist-  dr Irish Lack   a. 05/2007 CABG x 1: s/p SVG-OM;  b. 10/2010 Ex MV: EF 72%, inf attenuation w/o ischemia, brief run of PAT with exercise. To  .  Thrombocytopenia ([place])   . Thyroid goiter 2013   nodular  . Wears hearing aid    BILATERAL  . Wears partial dentures    UPPER    Past Surgical History:  Procedure Laterality Date  . AORTIC VALVE REPLACEMENT (AVR)/CORONARY ARTERY BYPASS GRAFTING (CABG)  06-19-2007  dr [name]   SVG to OM1 ;   AVR w/ #23 Mitralflow pericardial and ligation left atrial appendage;  MV repair w/ #28 Sorin 3-D memo Ring Annuloplasty with repair ruptured chordar of P-2 flail segments  . CARDIAC CATHETERIZATION  05-12-2007  dr Leonia Reeves   single vessel 30% ostial LAD,  80% LCx;  severe to critial AS,  moderate MR,  normal LVF, ef 70%,  normal right heart pressures and cardiac outputs,  mild elevated LV end-diastolic pressure    . CARDIOVASCULAR STRESS TEST  11-02-2010  dr Irish Lack   normal nuclear study w/ no ischemia or infarct/scar/  normal LV function and wall motion , ef 72%  . CARDIOVERSION N/A 04/06/2014   Procedure: CARDIOVERSION;  Surgeon: [name], MD;  Location: [place];  Service: Cardiovascular;  Laterality: N/A;   successful  . CATARACT EXTRACTION W/ INTRAOCULAR LENS  IMPLANT, BILATERAL  2012  approx  . CORONARY ARTERY BYPASS GRAFT    . RETROPUBIC RADICAL PROSTATECTOMY  06/ 2000  in [place]  . RIGHT/LEFT HEART CATH AND CORONARY/GRAFT ANGIOGRAPHY N/A 10/29/2018   Procedure: RIGHT/LEFT HEART CATH AND CORONARY/GRAFT ANGIOGRAPHY;  Surgeon: [name], MD;  Location: [place] CV LAB;  Service: Cardiovascular;  Laterality: N/A;  . ROTATOR CUFF REPAIR Left 09/1998  . TEE WITHOUT CARDIOVERSION N/A 04/06/2014   Procedure: TRANSESOPHAGEAL ECHOCARDIOGRAM (TEE);  Surgeon: [name], MD;  Location[facility] ENDOSCOPY;  Service: Cardiovascular;  Laterality: N/A;  mild focal basa LVH of the septum, ef 50-55%, s/p AV annuloplasty ring, elongated chordae, thickened leaflets, mild to mod. MR, multiple small jets/arotic bioprosthetic valve sits well in position, mild AI/ thickened TV w/ mild  reurg./mild LA  . TEE WITHOUT CARDIOVERSION N/A 10/22/2018   Procedure: TRANSESOPHAGEAL ECHOCARDIOGRAM (TEE);  Surgeon: [name], MD;  Location: [facility] ENDOSCOPY;  Service: Cardiovascular;  Laterality: N/A;  . TEE WITHOUT CARDIOVERSION N/A 11/25/2018   Procedure: TRANSESOPHAGEAL ECHOCARDIOGRAM (TEE);  Surgeon: [name], MD;  Location: [place];  Service: Open Heart Surgery;  Laterality: N/A;  . TRANSCATHETER AORTIC VALVE REPLACEMENT, TRANSFEMORAL N/A 11/25/2018   Procedure: TRANSCATHETER AORTIC VALVE REPLACEMENT, TRANSFEMORAL;  Surgeon: [name], MD;  Location: [place];  Service: Open Heart Surgery;  Laterality: N/A;  . TRANSTHORACIC ECHOCARDIOGRAM  03-23-2016   dr Irish Lack   EF 55-60%, reduced contribution atrial contraction to ventricular filling, due to increased ventricular diastolic pressure or atrial contratile dysfunction/  bioprosthetic AV w/ mod. regurg. (valve area 1.97cm^2)/ mild dilated ascending aorta/ mild thicken MV normal function annular ring prosthesis/severe LAE & mod. to sev.RAE/ PASP 17mHg/ mild TR/ ventricle septal motion show paradox  . TRANSURETHRAL RESECTION OF BLADDER TUMOR N/A 02/13/2016   Procedure: TRANSURETHRAL RESECTION OF BLADDER TUMOR (TURBT);  Surgeon: [name] MD;  [facility]  Service: Urology;  Laterality: N/A;  . TRANSURETHRAL RESECTION OF BLADDER TUMOR N/A 09/04/2016   Procedure: TRANSURETHRAL RESECTION OF BLADDER TUMOR (TURBT);  Surgeon: [name] MD;  [facility]  Service: Urology;  Laterality: N/A;    There were no vitals filed for this visit.   Subjective Assessment - 04/27/20 0848    Subjective No falls, feeling fine    Currently in Pain? No/denies                             [facility] - 04/27/20 0001      High Level Balance   High Level Balance Comments side step over foam rolls, Amb over foam rolls, amo over foam roll onto airex        Knee/Hip Exercises: Machines for Strengthening   Cybex Knee Extension 15lb 2x10    Cybex Knee Flexion 35# 3x10      Knee/Hip Exercises: Standing   Lateral Step Up Both;1 set;Hand Hold: 0;Step Height: 8"   on airex   Forward Step Up Both;2 sets;5 reps;10 reps;Hand Hold: 0;Step Height: 8"   on airex                   PT Short Term Goals - 02/11/20 0920      PT SHORT TERM GOAL #1   Title Pt will be I and compliant with initial HEP.    Status Achieved             PT Long Term Goals - 04/27/20 0925      PT LONG TERM GOAL #2   Title Pt will improv  BERG balance score >50 to show improved balance    Status Partially Met      PT LONG TERM GOAL #3   Title decrease TUG time to 12 seconds                 Plan - 04/27/20 0926    Clinical Impression Statement Pt did well completing the interventions. He has progressed meeting most goals. He is doing well, reports that he does a good job catching himself when he stumbles. Reports no recent falls, some instability today on non complaint surfaces.    Stability/Clinical Decision Making Stable/Uncomplicated    Rehab Potential Good    PT Frequency 2x / week    PT Duration 8 weeks    PT Next Visit Plan D/C PT           Patient will benefit from skilled therapeutic intervention in order to improve the following deficits and impairments:  Abnormal gait, Decreased range of motion, Difficulty walking, Decreased balance, Decreased strength  Visit Diagnosis: Other abnormalities of gait and mobility  Difficulty in walking, not elsewhere classified     Problem List Patient Active Problem List   Diagnosis Date Noted  . History of mitral valve repair 11/26/2018  . Prosthetic valve dysfunction 11/25/2018  . Ectatic abdominal aorta ([place]) 11/25/2018  . 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 History of bladder cancer   . History of pulmonary embolus (PE)   . Acute on chronic diastolic heart failure ([place]) 04/23/2018   . History of aortic valve replacement 04/23/2018  . Atrial fibrillation ([place]) 04/23/2018  . Coronary atherosclerosis of native coronary artery 02/25/2014  . Hyperlipidemia 02/25/2014  . Embolism and thrombosis ([place]) 03/11/2013   PHYSICAL THERAPY DISCHARGE SUMMARY  Visits from Start of Care: 20 Plan: Patient agrees to discharge.  Patient goals were not met. Patient is being discharged due to being pleased with the current functional level.  ?????     Scot Jun, PTA 04/27/2020, 9:28 AM  [address] [place], [name], [address] Phone: [phone]   Fax:  [phone]  Name: Eric [name] MRN: [id number] Date of Birth: [birth year]

## 2020-04-29 ENCOUNTER — Other Ambulatory Visit: Payer: Self-pay

## 2020-04-29 ENCOUNTER — Ambulatory Visit: Payer: Medicare Other | Admitting: Interventional Cardiology

## 2020-04-29 ENCOUNTER — Encounter: Payer: Self-pay | Admitting: Interventional Cardiology

## 2020-04-29 VITALS — BP 130/74 | HR 68 | Ht 72.0 in | Wt 192.2 lb

## 2020-04-29 DIAGNOSIS — I10 Essential (primary) hypertension: Secondary | ICD-10-CM | POA: Diagnosis not present

## 2020-04-29 DIAGNOSIS — I5032 Chronic diastolic (congestive) heart failure: Secondary | ICD-10-CM | POA: Diagnosis not present

## 2020-04-29 DIAGNOSIS — Z952 Presence of prosthetic heart valve: Secondary | ICD-10-CM | POA: Diagnosis not present

## 2020-04-29 DIAGNOSIS — I25118 Atherosclerotic heart disease of native coronary artery with other forms of angina pectoris: Secondary | ICD-10-CM | POA: Diagnosis not present

## 2020-04-29 NOTE — Patient Instructions (Signed)
Medication Instructions:  Your physician has recommended you make the following change in your medication:   STOP: metoprolol  *If you need a refill on your cardiac medications before your next appointment, please call your pharmacy*   Lab Work: None  If you have labs (blood work) drawn today and your tests are completely normal, you will receive your results only by: Marland Kitchen MyChart Message (if you have MyChart) OR . A paper copy in the mail If you have any lab test that is abnormal or we need to change your treatment, we will call you to review the results.   Testing/Procedures: None  Follow-Up: At [facility], you and your health needs are our priority.  As part of our continuing mission to provide you with exceptional heart care, we have created designated Provider Care Teams.  These Care Teams include your primary Cardiologist (physician) and Advanced Practice Providers (APPs -  Physician Assistants and Nurse Practitioners) who all work together to provide you with the care you need, when you need it.  We recommend signing up for the patient portal called "MyChart".  Sign up information is provided on this After Visit Summary.  MyChart is used to connect with patients for Virtual Visits (Telemedicine).  Patients are able to view lab/test results, encounter notes, upcoming appointments, etc.  Non-urgent messages can be sent to your provider as well.   To learn more about what you can do with MyChart, go to [email or web address].    Your next appointment:   6 month(s)  The format for your next appointment:   In Person  Provider:   You will see one of the following Advanced Practice Providers on your designated Care Team:    Melina Copa, PA-C  Ermalinda Barrios, PA-C  Then, [name], MD will plan to see you again in 12 month(s).   Other Instructions None

## 2020-04-29 NOTE — Progress Notes (Signed)
Cardiology Office Note   Date:  04/29/2020   ID:  Eric [name], DOB May 13, 1930, MRN [id number]  PCP:  [name], MD    No chief complaint on file.  S/p AVR  Wt Readings from Last 3 Encounters:  04/29/20 192 lb 3.2 oz (87.2 kg)  11/25/19 188 lb 12.8 oz (85.6 kg)  04/27/19 187 lb (84.8 kg)       History of Present Illness: Eric [name] is a 86 y.o. 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  Hehas had an aortic valve replacement, MV repairand single vessel bypass in 2009. Hehad a negative stress test in 2011. No chest pain prior to valve replacement surgery. He did have SHOB.   In late 2015, his heart rate was noted to be elevated at the time of a tooth extraction. He was found to be in atrial flutter. He underwent successful cardioversion. He notes that during the time he was out of rhythm, he had a cough. This got better after the cardioversion.   He went to [place] in February 2016 for about 3 weeks and did well on the NOAC. Less bleeding and bruising on Eliquis compared to his experience Coumadin. He then changed from Coumadin to Eliquisto treat multiple DVT and PE.  Hehad atooth extraction. Whenhe stayed on his anticoagulation, he had significant bleeding issues at that time.THe next time,he cameoff of the Eliquis.   Inlate 2017, he had several instances of blood clots in his urine. He underwent cystoscopy in the urology clinic after holding out was for 4 days. Thisshowed a malignancy. He had surgery and tolerated this well.  He has noticed more edema in his right leg since surgery back in 2017.It persists.  In late 2019, he was in [place] and was volume overloaded and short of breath. He was given IV Lasix and diuresed well. He was kept overnight. He was also found to be in AFib. He dd not feel Palpitations.   They were eating out all of the time. He had stopped his Lasix with his bladder  cancer. Echo showed normal LV function and 3+ AI.  In early 2020,He had a procedure on the left ear. It was bleeding and it was cauterized. 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   I spoke to his PMD, Dr. Riccardo Dubin 07/2018. His labs revealed concern for low grade hemolysis. TEE was considered but the patient was in [place], and given the COVID outbreak, the procedure was postponed.   He required repeat surgical procedure on the squamous cell area.He had some bleeding post procedure and required more stitches. He required a skin graft.  TEE was done in 09/2018: The left ventricle has low normal systolic function, with an ejection fraction of 50-55%. Left ventricular diastolic function could not be evaluated. No evidence of left ventricular regional wall motion abnormalities. 2. The right ventricle has normal systolc function. The cavity was normal. There is no increase in right ventricular wall thickness. 3. Left atrial size was mildly dilated. 4. Right atrial size was mildly dilated. 5. Moderately thickened tricuspid valve leaflets. 6. 30m Sorin mitral valve repair annular ring intact. 7. 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 8. A 28 Mitralflow porcine bioprosthesis valve is present in the aortic position. Echo findings are consistent with intravalvular regurgitation of the aortic prosthesis. 9. Aortic valve regurgitation is severe by color flow Doppler. Mild stenosis of the aortic valve. 10. No vegetation on the aortic  valve. 11. The aortic root is normal in size and structure. 12. Previously ligated left atrial appendage. 13. Severe bioprosthetic aortic valve regurgitation.  D/w Dr. [name] with plans for TAVR w/u.  POst TAVR f/u showed: "He wasevaluated by the multidisciplinary valve teamand underwentsuccessfulvalve in valveTAVR with a10m Edwards Sapien 3 THV via the TF approach on 11/25/18.Post operative  echoshowed EF 50-55%, severe MAC with mild MS, mild-mod TR and normally functioning TAVR with mean gradient 10.576mg and no PVL. His BP was noted to be elevated post operatively and he was started on Losartan 2565maily. He was discharged on home Eliquis and aspirin. Lasix decreased from 33m99mily to 20mg67mly.  Follow up labs showed increase in creat 1.05--> 1.44. Lasix decreased to 20mg 14my other day. Bilirubin was still elevated and this was sent to his PCP for review."  He has had some dental issues.  He had to have a tooth pulled in 03/2019.  Eliquis was continued.    He is in a balance class also, 2x/week.  Fell while playing pickle ball.   TAVR clinic visit in 7/21, things were stable.   Had some low BP in 10/21.  Holding losartan when BP is around 130 mm Hg. BP has been in the [id number] systolic since then.   Denies : Chest pain. Dizziness. Nitroglycerin use. Orthopnea. Palpitations. Paroxysmal nocturnal dyspnea. Shortness of breath. Syncope.      Past Medical History:  Diagnosis Date  . Atrial flutter, paroxysmal (HCC)  [place]. dx 04-02-2014, s/p  successful cardioversion 04-06-2014.  . ChroMarland Kitchenic anticoagulation    on Eliquis--  due to recurrent dvt's and pe's  . Dyslipidemia   . 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 History of DVT (deep vein thrombosis)    2000-- RLE  . History of melanoma excision    left flank;  07/ 2014 left lower leg and right upper chest  . History of prostate cancer    Gleason 6--  s/p  radical prostatectomy 06/ 2000 in [place]---  no recurrence  . History of pulmonary embolus (PE)    2000 & 2005  bilateral  . Hx of valvuloplasty 06/19/2007   a. s/p mitral ring annuloplasty, repair ruptured chordae of P2 flail segments of MV  . S/P aortic valve replacement with bioprosthetic valve    06-19-2007  severe aortic valve stenosis  . S/P valve-in-valve  TAVR 11/25/2018  . Single vessel coronary artery disease   cardiologist-  dr varanaIrish Lack1/2009 CABG x 1: s/p SVG-OM;  b. 10/2010 Ex MV: EF 72%, inf attenuation w/o ischemia, brief run of PAT with exercise. To  . Thrombocytopenia (HCC)  [place] 2013   nodular  . Wears hearing aid    BILATERAL  . Wears partial dentures    UPPER    Past Surgical History:  Procedure Laterality Date  . AORTIC VALVE REPLACEMENT (AVR)/CORONARY ARTERY BYPASS GRAFTING (CABG)  06-19-2007  dr bartleCyndia Bent to OM1 ;   AVR w/ #23 Mitralflow pericardial and ligation left atrial appendage;  MV repair w/ #28 Sorin 3-D memo Ring Annuloplasty with repair ruptured chordar of P-2 flail segments  . CARDIAC CATHETERIZATION  05-12-2007  dr edmundLeonia Reevesgle vessel 30% ostial LAD,  80% LCx;  severe to critial AS,  moderate MR,  normal LVF, ef 70%,  normal right heart pressures and  cardiac outputs,  mild elevated LV end-diastolic pressure    . CARDIOVASCULAR STRESS TEST  11-02-2010  dr Irish Lack   normal nuclear study w/ no ischemia or infarct/scar/  normal LV function and wall motion , ef 72%  . CARDIOVERSION N/A 04/06/2014   Procedure: CARDIOVERSION;  Surgeon: [name], MD;  Location: [facility];  Service: Cardiovascular;  Laterality: N/A;   successful  . CATARACT EXTRACTION W/ INTRAOCULAR LENS  IMPLANT, BILATERAL  2012  approx  . CORONARY ARTERY BYPASS GRAFT    . RETROPUBIC RADICAL PROSTATECTOMY  06/ 2000  in [place]  . RIGHT/LEFT HEART CATH AND CORONARY/GRAFT ANGIOGRAPHY N/A 10/29/2018   Procedure: RIGHT/LEFT HEART CATH AND CORONARY/GRAFT ANGIOGRAPHY;  Surgeon: [name], MD;  Location: [facility];  Service: Cardiovascular;  Laterality: N/A;  . ROTATOR CUFF REPAIR Left 09/1998  . TEE WITHOUT CARDIOVERSION N/A 04/06/2014   Procedure: TRANSESOPHAGEAL ECHOCARDIOGRAM (TEE);  Surgeon: [name], MD;  Location: [facility] ENDOSCOPY;  Service: Cardiovascular;  Laterality: N/A;  mild focal basa LVH  of the septum, ef 50-55%, s/p AV annuloplasty ring, elongated chordae, thickened leaflets, mild to mod. MR, multiple small jets/arotic bioprosthetic valve sits well in position, mild AI/ thickened TV w/ mild reurg./mild LA  . TEE WITHOUT CARDIOVERSION N/A 10/22/2018   Procedure: TRANSESOPHAGEAL ECHOCARDIOGRAM (TEE);  Surgeon: [name], MD;  Location[facility] ENDOSCOPY;  Service: Cardiovascular;  Laterality: N/A;  . TEE WITHOUT CARDIOVERSION N/A 11/25/2018   Procedure: TRANSESOPHAGEAL ECHOCARDIOGRAM (TEE);  Surgeon: [name], MD;  Location: [facility];  Service: Open Heart Surgery;  Laterality: N/A;  . TRANSCATHETER AORTIC VALVE REPLACEMENT, TRANSFEMORAL N/A 11/25/2018   Procedure: TRANSCATHETER AORTIC VALVE REPLACEMENT, TRANSFEMORAL;  Surgeon: [name], MD;  Location: [facility];  Service: Open Heart Surgery;  Laterality: N/A;  . TRANSTHORACIC ECHOCARDIOGRAM  03-23-2016   dr Irish Lack   EF 55-60%, reduced contribution atrial contraction to ventricular filling, due to increased ventricular diastolic pressure or atrial contratile dysfunction/  bioprosthetic AV w/ mod. regurg. (valve area 1.97cm^2)/ mild dilated ascending aorta/ mild thicken MV normal function annular ring prosthesis/severe LAE & mod. to sev.RAE/ PASP 12mHg/ mild TR/ ventricle septal motion show paradox  . TRANSURETHRAL RESECTION OF BLADDER TUMOR N/A 02/13/2016   Procedure: TRANSURETHRAL RESECTION OF BLADDER TUMOR (TURBT);  Surgeon: [name] MD;  [facility]  Service: Urology;  Laterality: N/A;  . TRANSURETHRAL RESECTION OF BLADDER TUMOR N/A 09/04/2016   Procedure: TRANSURETHRAL RESECTION OF BLADDER TUMOR (TURBT);  Surgeon: [name] MD;  [facility]  Service: Urology;  Laterality: N/A;     Current Outpatient Medications  Medication Sig Dispense Refill  . amoxicillin (AMOXIL) 500 MG tablet Take 4 tablets (2,000 mg total) by mouth as directed. Take 1 hour prior to  dental work, including cleanings. 4 tablet 6  . atorvastatin (LIPITOR) 40 MG tablet TAKE 1 TABLET BY MOUTH IN  THE EVENING 90 tablet 2  . ELIQUIS 5 MG TABS tablet TAKE 1 TABLET BY MOUTH  TWICE DAILY 180 tablet 1  . ezetimibe (ZETIA) 10 MG tablet TAKE 1 TABLET BY MOUTH IN  THE EVENING 90 tablet 0  . furosemide (LASIX) 20 MG tablet TAKE 1 TABLET BY MOUTH  EVERY OTHER DAY, MAY TAKE 1 ADDITIONAL DOSE IF NEEDED  FOR LEG SWELLING 90 tablet 3  . losartan (COZAAR) 25 MG tablet Take 1 tablet daily AS NEEDED. Take 1 tablet if Systolic BP >>951 Hold if Systolic BP <<884 90 tablet  3  . metoprolol tartrate (LOPRESSOR) 25 MG tablet TAKE ONE-HALF TABLET BY  MOUTH TWICE DAILY 90 tablet 1   No current facility-administered medications for this visit.    Allergies:   Patient has no known allergies.    Social History:  The patient  reports that he has never smoked. He has never used smokeless tobacco. He reports current alcohol use of about 1.0 - 2.0 standard drink of alcohol per week. He reports that he does not use drugs.   Family History:  The patient's family history includes Leukemia in his mother; Supraventricular tachycardia in his sister.    ROS:  Please see the history of present illness.   Otherwise, review of systems are positive for decreased balance.   All other systems are reviewed and negative.    PHYSICAL EXAM: VS:  BP 130/74   Pulse 68   Ht 6' (1.829 m)   Wt 192 lb 3.2 oz (87.2 kg)   SpO2 98%   BMI 26.07 kg/m  , BMI Body mass index is 26.07 kg/m. GEN: Well nourished, well developed, in no acute distress  HEENT: normal  Neck: no JVD, carotid bruits, or masses Cardiac: RRR; 1/6 systolic murmur; no rubs, or gallops,no edema  Respiratory:  clear to auscultation bilaterally, normal work of breathing GI: soft, nontender, nondistended, + BS MS: no deformity or atrophy  Skin: warm and dry, no rash Neuro:  Strength and sensation are intact Psych: euthymic mood, full affect   EKG:    The ekg ordered 11/25/19 demonstrates AFib, slow ventricular response   Recent Labs: 11/25/2019: BUN 19; Creatinine, Ser 1.00; Hemoglobin 16.0; Platelets 151; Potassium 4.4; Sodium 139; TSH 3.010   Lipid Panel    Component Value Date/Time   CHOL 130 04/27/2019 0840   TRIG 66 04/27/2019 0840   HDL 63 04/27/2019 0840   CHOLHDL 2.1 04/27/2019 0840   LDLCALC 53 04/27/2019 0840     Other studies Reviewed: Additional studies/ records that were reviewed today with results demonstrating: labs reviewed.   ASSESSMENT AND PLAN:  1. AFib: Slow ventricular response.  Stop metoprolol.  Eliquis for stroke prevention. Watch for dizziness.  2. S/p TAVR: needs SBE prophylaxis.  Also s/p MV repair 3. CAD: No angina. Continue aggressive secondary prevention.  S/p CABG.  4. Chronic diastolic heart failure: Appears euvolemic.  5. HTN: Continue current meds including prn losartan based on readings.  6. Prior DVT: on Eliquis.    Current medicines are reviewed at length with the patient today.  The patient concerns regarding his medicines were addressed.  The following changes have been made:  No change  Labs/ tests ordered today include:  No orders of the defined types were placed in this encounter.   Recommend 150 minutes/week of aerobic exercise Low fat, low carb, high fiber diet recommended  Disposition:   FU in 6 months with APP, 1 year with me   Signed, [name], MD  04/29/2020 8:59 AM    [facility] [address], [place], [place]  [address] Phone: [phone]; Fax: [phone]

## 2020-06-02 ENCOUNTER — Other Ambulatory Visit: Payer: Self-pay | Admitting: Interventional Cardiology

## 2020-07-25 LAB — COMPREHENSIVE METABOLIC PANEL WITH GFR: EGFR: 78

## 2020-08-18 ENCOUNTER — Other Ambulatory Visit: Payer: Self-pay | Admitting: Interventional Cardiology

## 2020-08-19 NOTE — Telephone Encounter (Signed)
Patient Instructions by [name], RN at 04/29/2020 8:40 AM  Author: Drue Novel I, RN Author Type: Registered Nurse Filed: 04/29/2020  9:05 AM  Note Status: Signed Cosign: Cosign Not Required Encounter Date: 04/29/2020  Editor: Drue Novel I, RN (Registered Nurse)             Medication Instructions:  Your physician has recommended you make the following change in your medication:    STOP: metoprolol

## 2020-08-19 NOTE — Telephone Encounter (Signed)
Eliquis 5mg  refill request received. Patient is 87 years old, weight-87.2kg, Crea-1.00 on 11/25/2019, Diagnosis-Aflutter, DVT, PE, and last seen by Dr. Irish Lack on 04/29/2020. Dose is appropriate based on dosing criteria. Will send in refill to requested pharmacy.

## 2020-11-24 ENCOUNTER — Emergency Department (HOSPITAL_COMMUNITY): Payer: Medicare Other

## 2020-11-24 ENCOUNTER — Encounter (HOSPITAL_COMMUNITY): Payer: Self-pay | Admitting: Emergency Medicine

## 2020-11-24 ENCOUNTER — Observation Stay (HOSPITAL_COMMUNITY): Payer: Medicare Other

## 2020-11-24 ENCOUNTER — Observation Stay (HOSPITAL_COMMUNITY)
Admission: EM | Admit: 2020-11-24 | Discharge: 2020-11-25 | Disposition: A | Discharge status: Home / Self Care | Payer: Medicare Other | Attending: Internal Medicine | Admitting: Internal Medicine

## 2020-11-24 DIAGNOSIS — I6330 Cerebral infarction due to thrombosis of unspecified cerebral artery: Secondary | ICD-10-CM | POA: Diagnosis not present

## 2020-11-24 DIAGNOSIS — Z20822 Contact with and (suspected) exposure to covid-19: Secondary | ICD-10-CM | POA: Insufficient documentation

## 2020-11-24 DIAGNOSIS — I480 Paroxysmal atrial fibrillation: Secondary | ICD-10-CM | POA: Insufficient documentation

## 2020-11-24 DIAGNOSIS — I110 Hypertensive heart disease with heart failure: Secondary | ICD-10-CM | POA: Diagnosis not present

## 2020-11-24 DIAGNOSIS — I2510 Atherosclerotic heart disease of native coronary artery without angina pectoris: Secondary | ICD-10-CM | POA: Diagnosis present

## 2020-11-24 DIAGNOSIS — Z8551 Personal history of malignant neoplasm of bladder: Secondary | ICD-10-CM | POA: Diagnosis not present

## 2020-11-24 DIAGNOSIS — Y900 Blood alcohol level of less than 20 mg/100 ml: Secondary | ICD-10-CM | POA: Insufficient documentation

## 2020-11-24 DIAGNOSIS — Z79899 Other long term (current) drug therapy: Secondary | ICD-10-CM | POA: Insufficient documentation

## 2020-11-24 DIAGNOSIS — Z9889 Other specified postprocedural states: Secondary | ICD-10-CM

## 2020-11-24 DIAGNOSIS — R531 Weakness: Secondary | ICD-10-CM | POA: Diagnosis not present

## 2020-11-24 DIAGNOSIS — T8209XA Other mechanical complication of heart valve prosthesis, initial encounter: Secondary | ICD-10-CM | POA: Diagnosis present

## 2020-11-24 DIAGNOSIS — Z7901 Long term (current) use of anticoagulants: Secondary | ICD-10-CM | POA: Insufficient documentation

## 2020-11-24 DIAGNOSIS — I5033 Acute on chronic diastolic (congestive) heart failure: Secondary | ICD-10-CM | POA: Diagnosis not present

## 2020-11-24 DIAGNOSIS — Z952 Presence of prosthetic heart valve: Secondary | ICD-10-CM

## 2020-11-24 DIAGNOSIS — Z951 Presence of aortocoronary bypass graft: Secondary | ICD-10-CM | POA: Insufficient documentation

## 2020-11-24 DIAGNOSIS — I4891 Unspecified atrial fibrillation: Secondary | ICD-10-CM | POA: Diagnosis present

## 2020-11-24 DIAGNOSIS — Z8546 Personal history of malignant neoplasm of prostate: Secondary | ICD-10-CM | POA: Diagnosis not present

## 2020-11-24 DIAGNOSIS — R200 Anesthesia of skin: Secondary | ICD-10-CM | POA: Diagnosis present

## 2020-11-24 DIAGNOSIS — I639 Cerebral infarction, unspecified: Secondary | ICD-10-CM

## 2020-11-24 DIAGNOSIS — E785 Hyperlipidemia, unspecified: Secondary | ICD-10-CM | POA: Diagnosis present

## 2020-11-24 DIAGNOSIS — Z86711 Personal history of pulmonary embolism: Secondary | ICD-10-CM | POA: Diagnosis present

## 2020-11-24 LAB — I-STAT CHEM 8, ED
BUN: 17 mg/dL (ref 8–23)
Calcium, Ion: 1.26 mmol/L (ref 1.15–1.40)
Chloride: 105 mmol/L (ref 98–111)
Creatinine, Ser: 0.8 mg/dL (ref 0.61–1.24)
Glucose, Bld: 124 mg/dL — ABNORMAL HIGH (ref 70–99)
HCT: 47 % (ref 39.0–52.0)
Hemoglobin: 16 g/dL (ref 13.0–17.0)
Potassium: 4.1 mmol/L (ref 3.5–5.1)
Sodium: 140 mmol/L (ref 135–145)
TCO2: 23 mmol/L (ref 22–32)

## 2020-11-24 LAB — CBC
HCT: 45.8 % (ref 39.0–52.0)
Hemoglobin: 15.4 g/dL (ref 13.0–17.0)
MCH: 31.2 pg (ref 26.0–34.0)
MCHC: 33.6 g/dL (ref 30.0–36.0)
MCV: 92.7 fL (ref 80.0–100.0)
Platelets: 155 10*3/uL (ref 150–400)
RBC: 4.94 MIL/uL (ref 4.22–5.81)
RDW: 12 % (ref 11.5–15.5)
WBC: 4.4 10*3/uL (ref 4.0–10.5)
nRBC: 0 % (ref 0.0–0.2)

## 2020-11-24 LAB — COMPREHENSIVE METABOLIC PANEL
ALT: 20 U/L (ref 0–44)
AST: 27 U/L (ref 15–41)
Albumin: 4 g/dL (ref 3.5–5.0)
Alkaline Phosphatase: 52 U/L (ref 38–126)
Anion gap: 7 (ref 5–15)
BUN: 15 mg/dL (ref 8–23)
CO2: 25 mmol/L (ref 22–32)
Calcium: 9.5 mg/dL (ref 8.9–10.3)
Chloride: 106 mmol/L (ref 98–111)
Creatinine, Ser: 0.93 mg/dL (ref 0.61–1.24)
GFR, Estimated: >60 mL/min (ref >60)
Glucose, Bld: 123 mg/dL — ABNORMAL HIGH (ref 70–99)
Potassium: 4.1 mmol/L (ref 3.5–5.1)
Sodium: 138 mmol/L (ref 135–145)
Total Bilirubin: 3.9 mg/dL — ABNORMAL HIGH (ref 0.3–1.2)
Total Protein: 7 g/dL (ref 6.5–8.1)

## 2020-11-24 LAB — PROTIME-INR
INR: 1.3 — ABNORMAL HIGH (ref 0.8–1.2)
Prothrombin Time: 16.2 seconds — ABNORMAL HIGH (ref 11.4–15.2)

## 2020-11-24 LAB — RAPID URINE DRUG SCREEN, HOSP PERFORMED
Amphetamines: NOT DETECTED
Barbiturates: NOT DETECTED
Benzodiazepines: NOT DETECTED
Cocaine: NOT DETECTED
Opiates: NOT DETECTED
Tetrahydrocannabinol: NOT DETECTED

## 2020-11-24 LAB — URINALYSIS, ROUTINE W REFLEX MICROSCOPIC
Bilirubin Urine: NEGATIVE
Glucose, UA: NEGATIVE mg/dL
Hgb urine dipstick: NEGATIVE
Ketones, ur: NEGATIVE mg/dL
Nitrite: NEGATIVE
Protein, ur: NEGATIVE mg/dL
Specific Gravity, Urine: 1.01 (ref 1.005–1.030)
pH: 7 (ref 5.0–8.0)

## 2020-11-24 LAB — DIFFERENTIAL
Abs Immature Granulocytes: 0.01 10*3/uL (ref 0.00–0.07)
Basophils Absolute: 0.1 10*3/uL (ref 0.0–0.1)
Basophils Relative: 1 %
Eosinophils Absolute: 0 10*3/uL (ref 0.0–0.5)
Eosinophils Relative: 1 %
Immature Granulocytes: 0 %
Lymphocytes Relative: 28 %
Lymphs Abs: 1.2 10*3/uL (ref 0.7–4.0)
Monocytes Absolute: 0.4 10*3/uL (ref 0.1–1.0)
Monocytes Relative: 9 %
Neutro Abs: 2.7 10*3/uL (ref 1.7–7.7)
Neutrophils Relative %: 61 %

## 2020-11-24 LAB — RESP PANEL BY RT-PCR (FLU A&B, COVID) ARPGX2
Influenza A by PCR: NEGATIVE
Influenza B by PCR: NEGATIVE
SARS Coronavirus 2 by RT PCR: NEGATIVE

## 2020-11-24 LAB — ETHANOL: Alcohol, Ethyl (B): <10 mg/dL (ref <10)

## 2020-11-24 LAB — APTT: aPTT: 32 seconds (ref 24–36)

## 2020-11-24 IMAGING — MR MR MRA NECK WO/W CM
5 of 9 series · 18 of 48 positions shown · IV contrast (Yes GAD)
Comparison: Prior head CT from earlier the same day.

CLINICAL DATA: Initial evaluation for neuro deficit, stroke
suspected.

EXAM:
MRI HEAD WITHOUT CONTRAST
MRA HEAD WITHOUT CONTRAST
MRA NECK WITHOUT AND WITH CONTRAST
TECHNIQUE: Multiplanar, multi-echo pulse sequences of the brain and surrounding
structures were acquired without intravenous contrast. Angiographic
images of the Circle of Willis were acquired using MRA technique
without intravenous contrast. Angiographic images of the neck were
acquired using MRA technique without and with intravenous contrast.
Carotid stenosis measurements (when applicable) are obtained
utilizing NASCET criteria, using the distal internal carotid
diameter as the denominator.
CONTRAST:  8.5mL GADAVIST GADOBUTROL 1 MMOL/ML IV SOLN

[Series 3: TOF · axial · 6.0mm · 1.17mm/px · z∈[-260,-35]mm · 2 of 40 slices shown (1 of 2)]
[im 1/40]
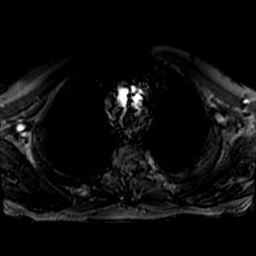
[im 40/40]
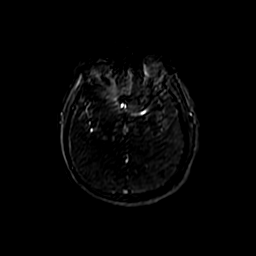

[Series 301: TOF · sagittal · 6.0mm · 1.17mm/px · 1 of 5 slices shown (2 of 2)]
[im 1/5]
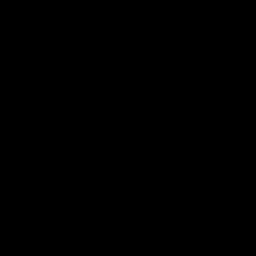

[Series 400: cor cemra ft · coronal · 1.2mm · 0.59mm/px · 7 of 117 slices shown]
[im 1/117]
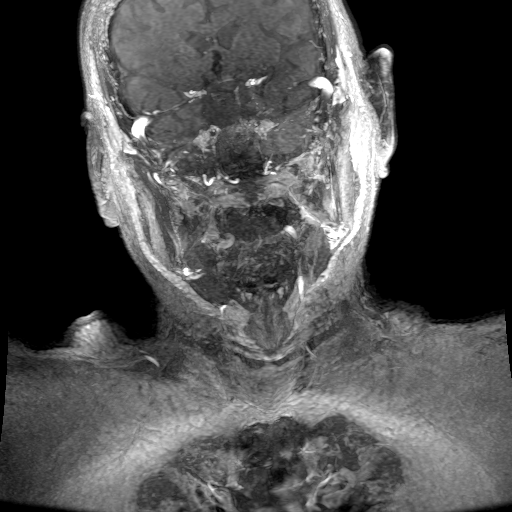
[im 20/117]
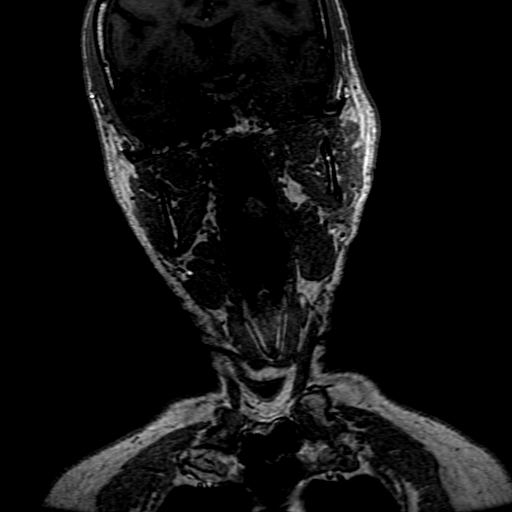
[im 39/117]
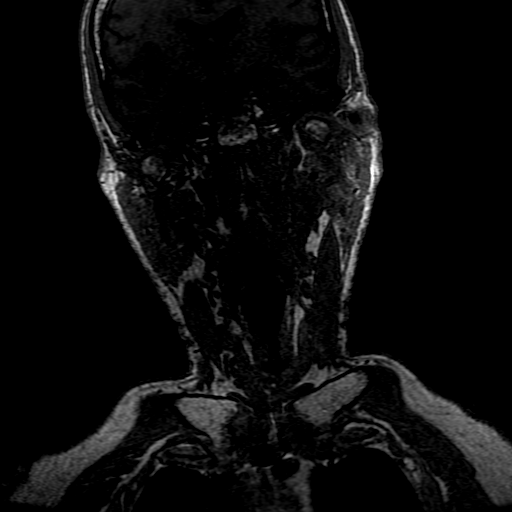
[im 59/117]
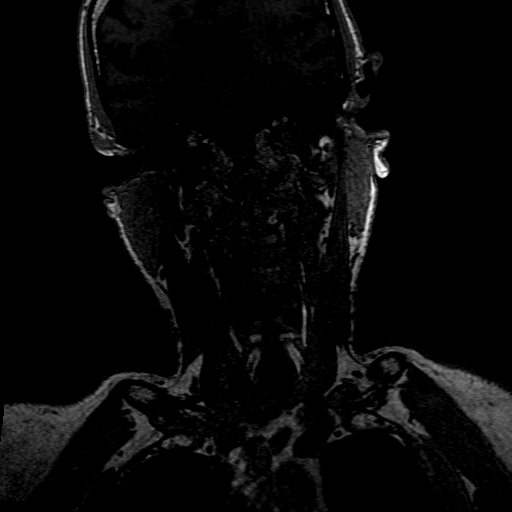
[im 78/117]
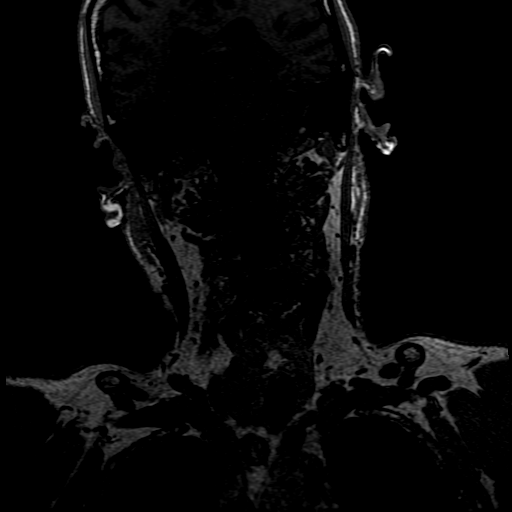
[im 97/117]
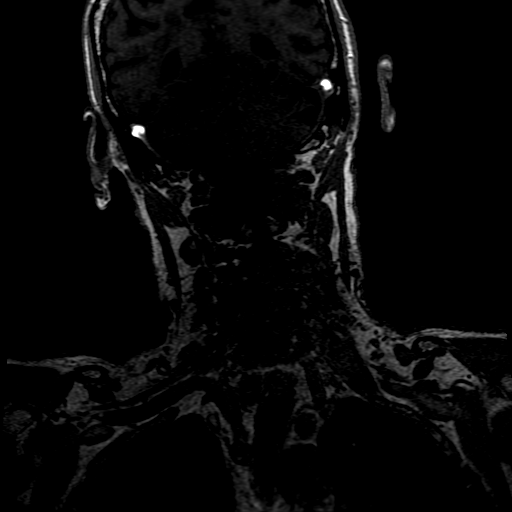
[im 117/117]
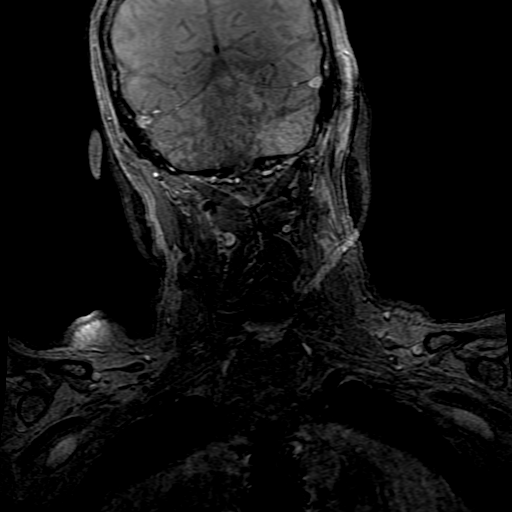

[Series 401: ph1/cor cemra ft · coronal · 1.2mm · 0.59mm/px · 7 of 116 slices shown]
[im 1/116]
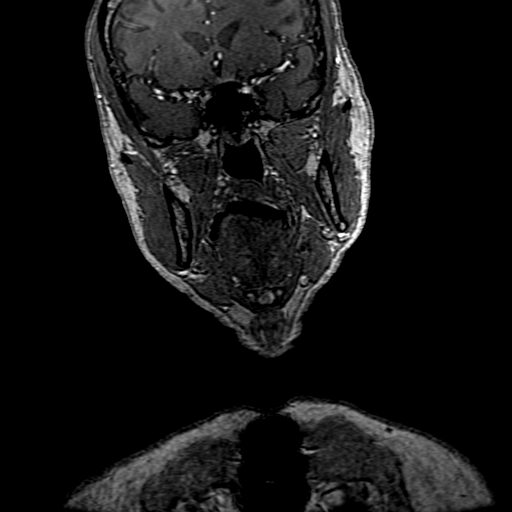
[im 20/116]
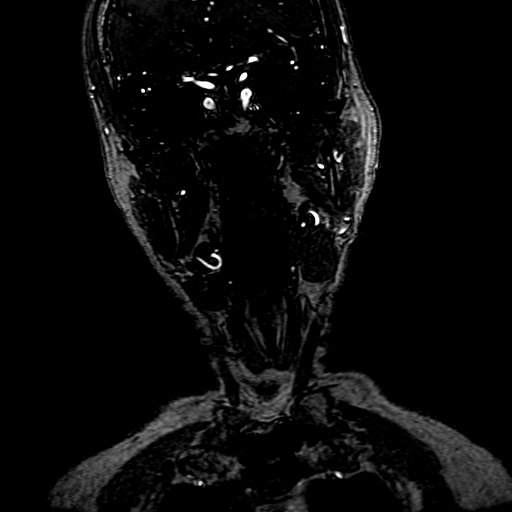
[im 39/116]
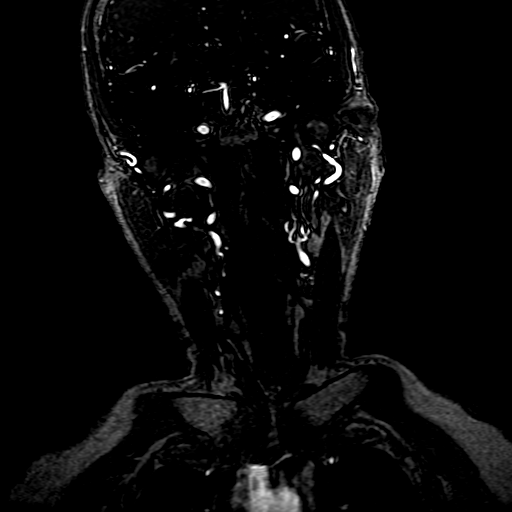
[im 58/116]
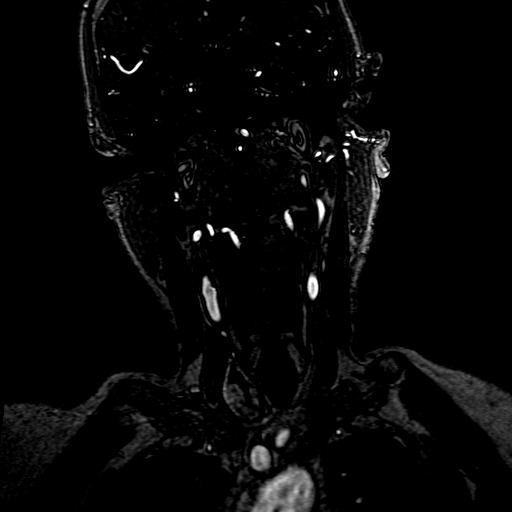
[im 77/116]
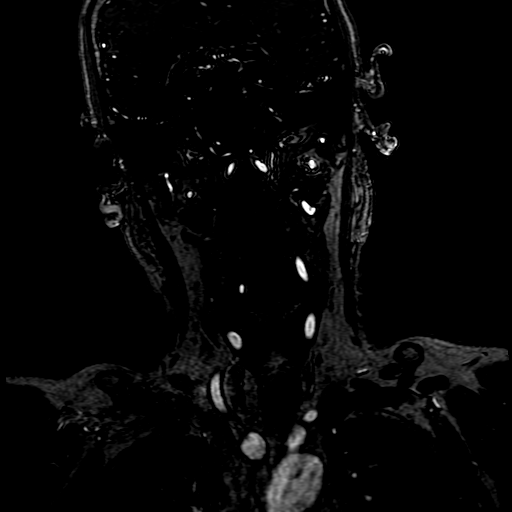
[im 96/116]
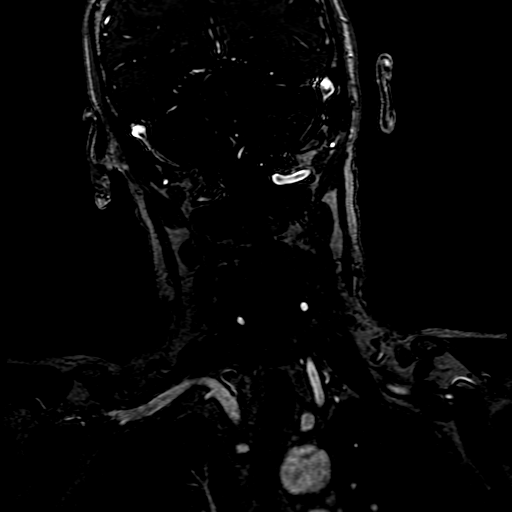
[im 116/116]
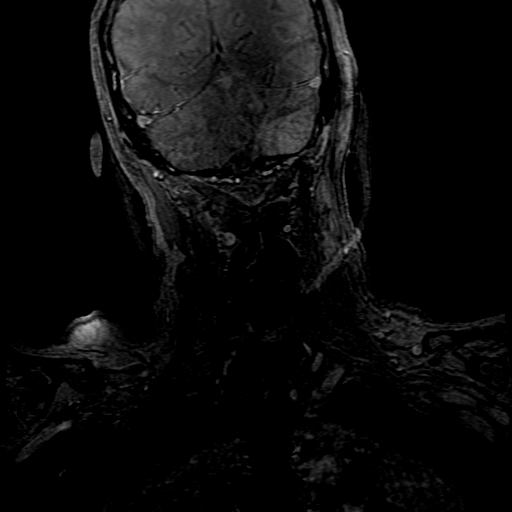

[Series 402: ph2/cor cemra ft · coronal · 1.2mm · 0.59mm/px · 1 of 116 slices shown]
[im 1/116]
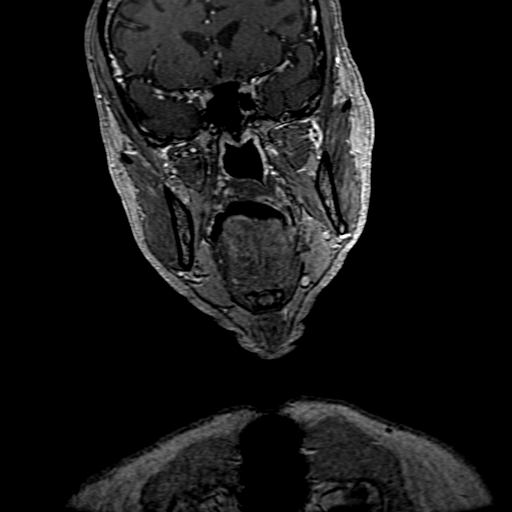

[18 of 48 positions shown; findings below may reference images not displayed]

FINDINGS: MRI HEAD FINDINGS

Brain: Diffuse prominence of the CSF containing spaces compatible
generalized age-related cerebral atrophy. Patchy and confluent
T2/FLAIR hyperintensity within the periventricular deep white matter
both cerebral hemispheres most consistent with chronic small vessel
ischemic disease, moderate in nature. Few scattered superimposed
remote lacunar infarcts present about the hemispheric cerebral white
matter. Few small remote lacunar infarcts present about the basal
ganglia and right thalamus. Few small remote right cerebellar
infarcts noted as well.

1 cm acute ischemic infarcts seen involving the deep white matter of
the posterior left frontal centrum semi ovale (series 2, image 37).
No associated hemorrhage or mass effect. No other evidence for acute
or subacute ischemia. Gray-white matter differentiation otherwise
maintained. No encephalomalacia to suggest chronic cortical
infarction elsewhere within the brain. No foci of susceptibility
artifact to suggest acute or chronic intracranial hemorrhage.

No mass lesion, midline shift or mass effect. No hydrocephalus or
extra-axial fluid collection. Pituitary gland suprasellar region
within normal limits. Midline structures intact.

Vascular: Major intracranial vascular flow voids are maintained.

Skull and upper cervical spine: Craniocervical junction within
normal limits. Bone marrow signal intensity normal. No scalp soft
tissue abnormality.

Sinuses/Orbits: Patient status post bilateral ocular lens
replacement. Globes and orbital soft tissues demonstrate no acute
finding. Mild scattered mucosal thickening noted within the
ethmoidal air cells. Paranasal sinuses are otherwise clear. No
mastoid effusion. Inner ear structures grossly normal.

Other: None.

MRA HEAD FINDINGS

Anterior circulation: Distal cervical segments of the internal
carotid arteries are patent with antegrade flow. Petrous, cavernous,
and supraclinoid segments patent without stenosis or other
abnormality. A1 segments widely patent. Normal anterior
communicating artery complex. Anterior cerebral arteries patent to
their distal aspects without stenosis. No M1 stenosis or occlusion.
Normal MCA bifurcations. Distal MCA branches well perfused and
symmetric.

Posterior circulation: Both V4 segments patent to the
vertebrobasilar junction without stenosis. Left vertebral artery
dominant. Both PICA origins patent and normal. Basilar patent to its
distal aspect without stenosis. Superior cerebellar arteries patent
bilaterally. Both PCAs primarily supplied via the basilar well
perfused or distal aspects.

Anatomic variants: None significant.  No aneurysm.

MRA NECK FINDINGS

Aortic arch: Visualized aortic arch normal in caliber with normal 3
vessel morphology. No hemodynamically significant stenosis seen
about the origin of the great vessels.

Right carotid system: Right CCA patent from its origin to the
bifurcation without stenosis. Minor atheromatous irregularity about
the right carotid bulb without stenosis. Right ICA widely patent
distally without stenosis, evidence for dissection or occlusion.

Left carotid system: Left CCA patent from its origin to the
bifurcation without stenosis. Minor atheromatous irregularity about
the left carotid bulb without stenosis. Left ICA patent distally
without stenosis, evidence for dissection or occlusion.

Vertebral arteries: Both vertebral arteries arise from the
subclavian arteries. Left vertebral artery dominant. Vertebral
arteries patent without stenosis, evidence for dissection or
occlusion.

Other: None
IMPRESSION: MRI HEAD:

1. 1 cm acute ischemic nonhemorrhagic infarct involving the deep
white matter of the posterior left frontal centrum semi ovale.
2. Underlying age-related cerebral atrophy with moderate chronic
microvascular ischemic disease, with a few scattered remote lacunar
infarcts involving the hemispheric cerebral white matter, basal
ganglia, right thalamus, and right cerebellum.

MRA HEAD:

Normal intracranial MRA. No large vessel occlusion, hemodynamically
significant stenosis, or other acute vascular abnormality.

MRA NECK:

Negative MRA of the neck. No hemodynamically significant or critical
flow limiting stenosis.

## 2020-11-24 IMAGING — MR MR MRA HEAD W/O CM
2 series · 16 of 48 positions shown · IV contrast (gadavist)
Comparison: Prior head CT from earlier the same day.

CLINICAL DATA: Initial evaluation for neuro deficit, stroke
suspected.

EXAM:
MRI HEAD WITHOUT CONTRAST
MRA HEAD WITHOUT CONTRAST
MRA NECK WITHOUT AND WITH CONTRAST
TECHNIQUE: Multiplanar, multi-echo pulse sequences of the brain and surrounding
structures were acquired without intravenous contrast. Angiographic
images of the Circle of Willis were acquired using MRA technique
without intravenous contrast. Angiographic images of the neck were
acquired using MRA technique without and with intravenous contrast.
Carotid stenosis measurements (when applicable) are obtained
utilizing NASCET criteria, using the distal internal carotid
diameter as the denominator.
CONTRAST:  8.5mL GADAVIST GADOBUTROL 1 MMOL/ML IV SOLN

[Series 2: ax (id) · axial · 1.0mm · 0.43mm/px · z∈[-79,+14]mm · 15 of 197 slices shown]
[im 1/197]
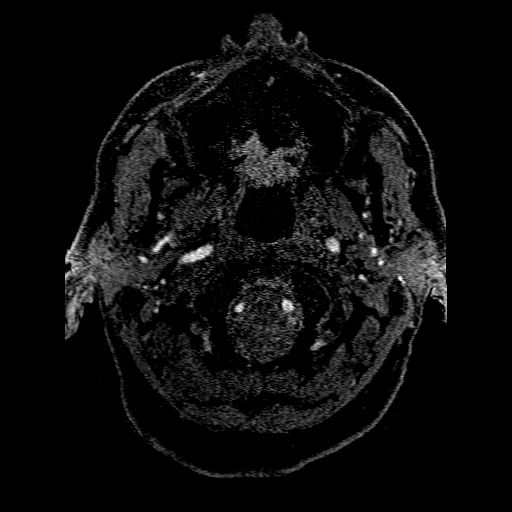
[im 5/197]
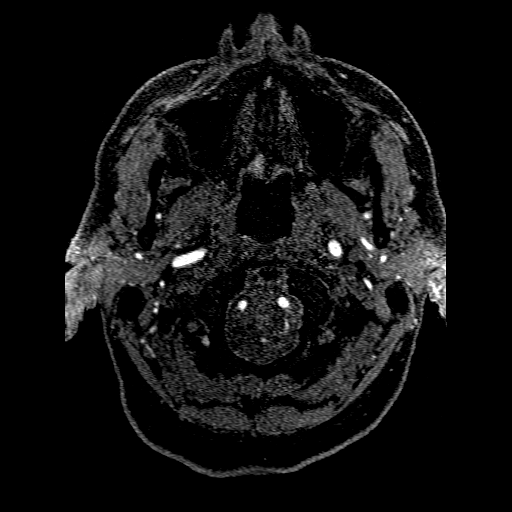
[im 9/197]
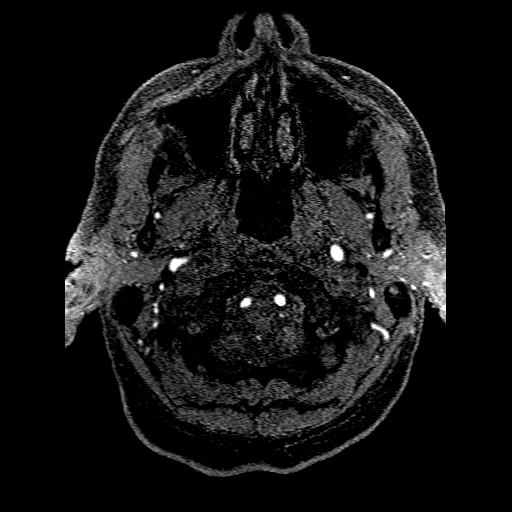
[im 13/197]
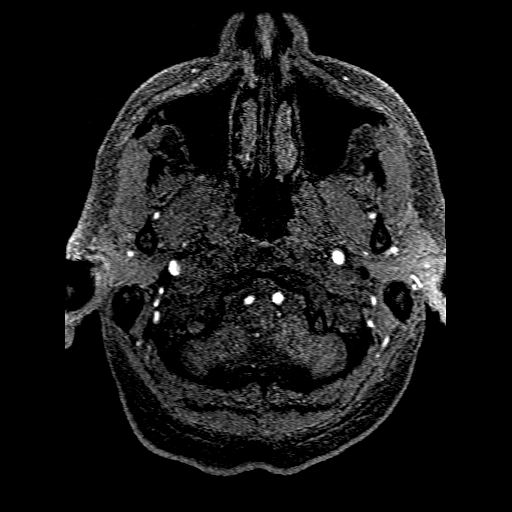
[im 18/197]
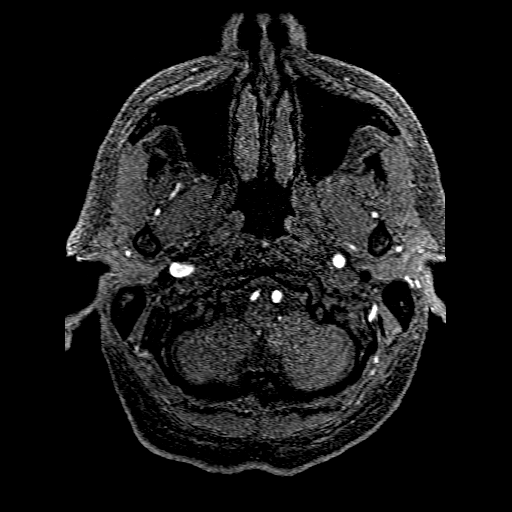
[im 30/197]
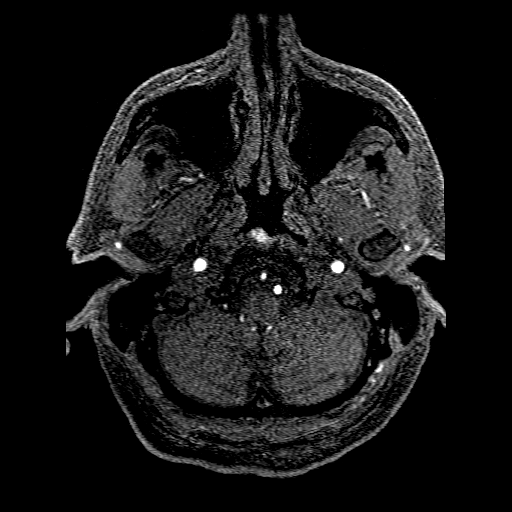
[im 35/197]
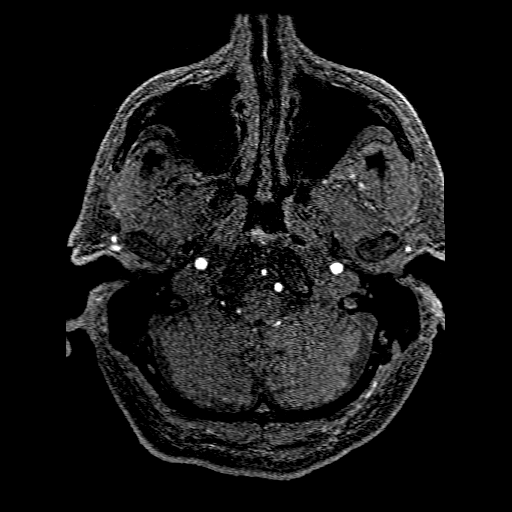
[im 60/197]
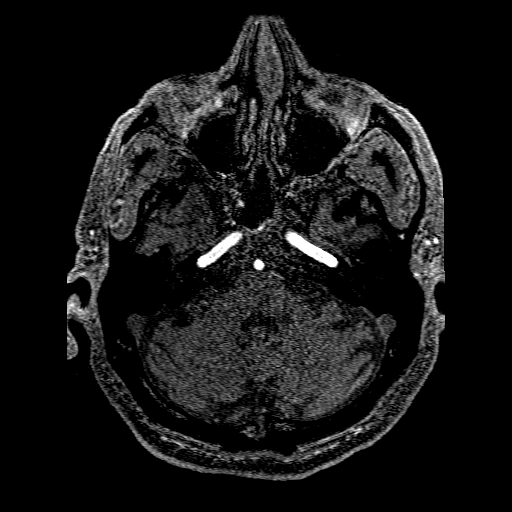
[im 86/197]
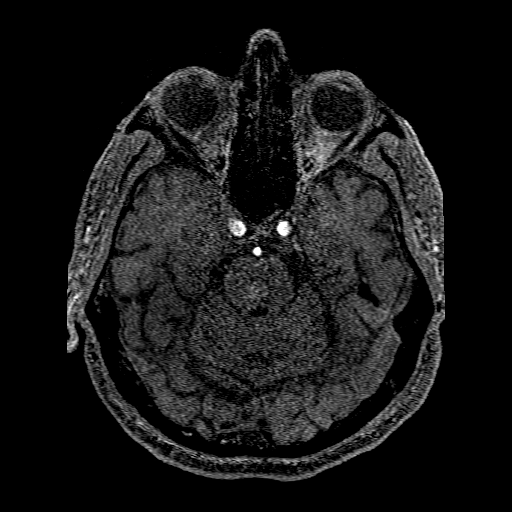
[im 99/197]
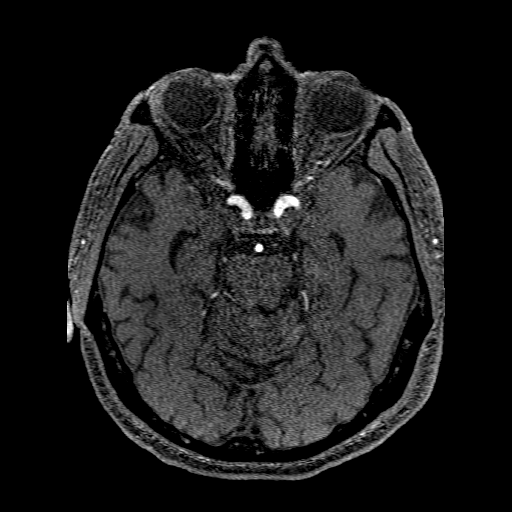
[im 111/197]
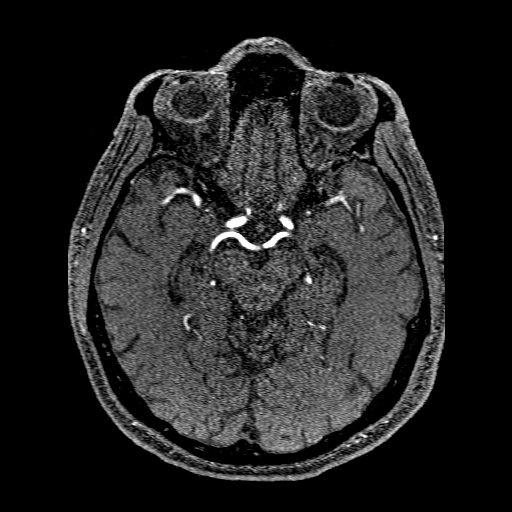
[im 137/197]
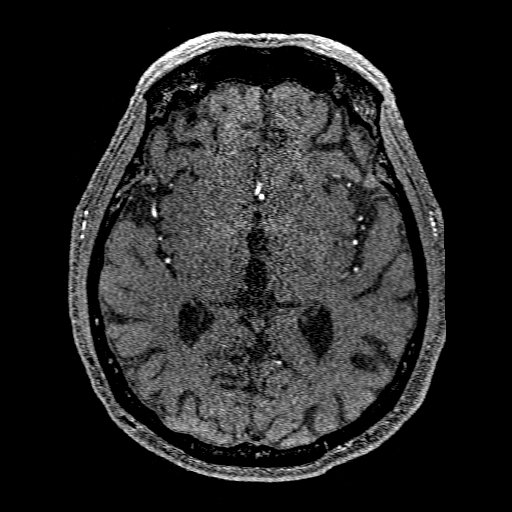
[im 162/197]
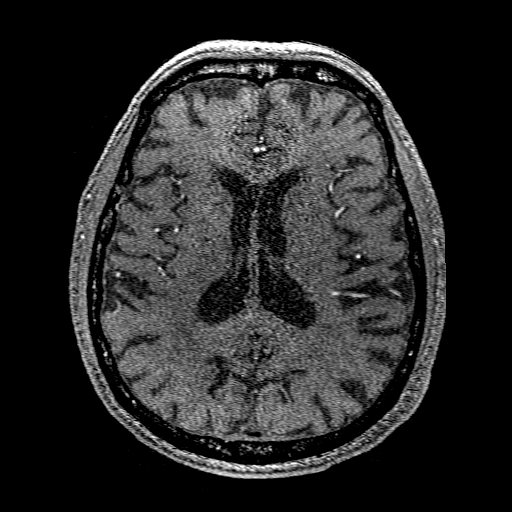
[im 167/197]
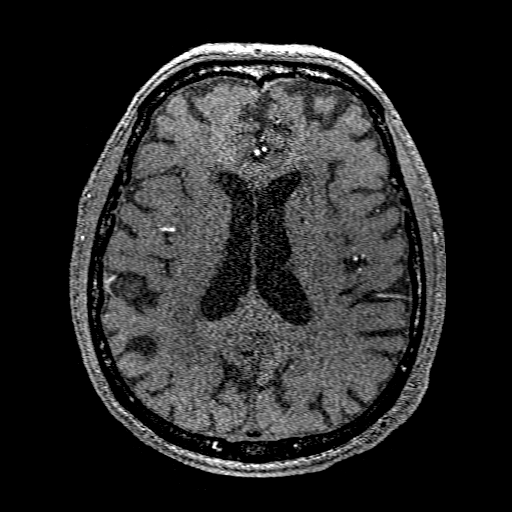
[im 188/197]
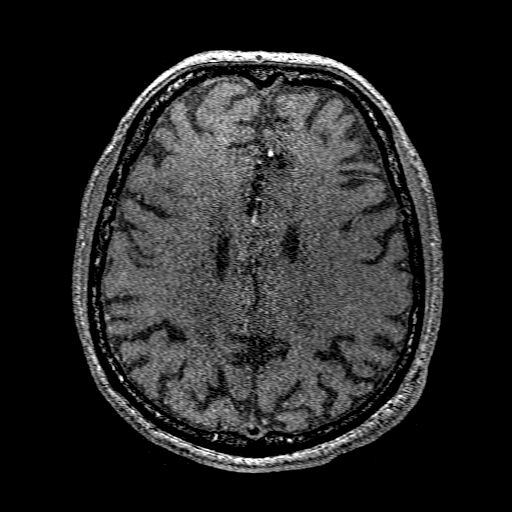

[Series 201: pjn:ax (id) · sagittal · 1.0mm · 0.43mm/px · 1 of 4 slices shown]
[im 1/4]
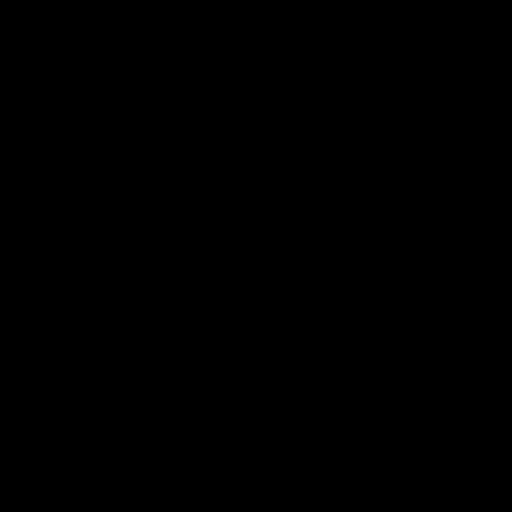

[16 of 48 positions shown; findings below may reference images not displayed]

FINDINGS: MRI HEAD FINDINGS

Brain: Diffuse prominence of the CSF containing spaces compatible
generalized age-related cerebral atrophy. Patchy and confluent
T2/FLAIR hyperintensity within the periventricular deep white matter
both cerebral hemispheres most consistent with chronic small vessel
ischemic disease, moderate in nature. Few scattered superimposed
remote lacunar infarcts present about the hemispheric cerebral white
matter. Few small remote lacunar infarcts present about the basal
ganglia and right thalamus. Few small remote right cerebellar
infarcts noted as well.

1 cm acute ischemic infarcts seen involving the deep white matter of
the posterior left frontal centrum semi ovale (series 2, image 37).
No associated hemorrhage or mass effect. No other evidence for acute
or subacute ischemia. Gray-white matter differentiation otherwise
maintained. No encephalomalacia to suggest chronic cortical
infarction elsewhere within the brain. No foci of susceptibility
artifact to suggest acute or chronic intracranial hemorrhage.

No mass lesion, midline shift or mass effect. No hydrocephalus or
extra-axial fluid collection. Pituitary gland suprasellar region
within normal limits. Midline structures intact.

Vascular: Major intracranial vascular flow voids are maintained.

Skull and upper cervical spine: Craniocervical junction within
normal limits. Bone marrow signal intensity normal. No scalp soft
tissue abnormality.

Sinuses/Orbits: Patient status post bilateral ocular lens
replacement. Globes and orbital soft tissues demonstrate no acute
finding. Mild scattered mucosal thickening noted within the
ethmoidal air cells. Paranasal sinuses are otherwise clear. No
mastoid effusion. Inner ear structures grossly normal.

Other: None.

MRA HEAD FINDINGS

Anterior circulation: Distal cervical segments of the internal
carotid arteries are patent with antegrade flow. Petrous, cavernous,
and supraclinoid segments patent without stenosis or other
abnormality. A1 segments widely patent. Normal anterior
communicating artery complex. Anterior cerebral arteries patent to
their distal aspects without stenosis. No M1 stenosis or occlusion.
Normal MCA bifurcations. Distal MCA branches well perfused and
symmetric.

Posterior circulation: Both V4 segments patent to the
vertebrobasilar junction without stenosis. Left vertebral artery
dominant. Both PICA origins patent and normal. Basilar patent to its
distal aspect without stenosis. Superior cerebellar arteries patent
bilaterally. Both PCAs primarily supplied via the basilar well
perfused or distal aspects.

Anatomic variants: None significant.  No aneurysm.

MRA NECK FINDINGS

Aortic arch: Visualized aortic arch normal in caliber with normal 3
vessel morphology. No hemodynamically significant stenosis seen
about the origin of the great vessels.

Right carotid system: Right CCA patent from its origin to the
bifurcation without stenosis. Minor atheromatous irregularity about
the right carotid bulb without stenosis. Right ICA widely patent
distally without stenosis, evidence for dissection or occlusion.

Left carotid system: Left CCA patent from its origin to the
bifurcation without stenosis. Minor atheromatous irregularity about
the left carotid bulb without stenosis. Left ICA patent distally
without stenosis, evidence for dissection or occlusion.

Vertebral arteries: Both vertebral arteries arise from the
subclavian arteries. Left vertebral artery dominant. Vertebral
arteries patent without stenosis, evidence for dissection or
occlusion.

Other: None
IMPRESSION: MRI HEAD:

1. 1 cm acute ischemic nonhemorrhagic infarct involving the deep
white matter of the posterior left frontal centrum semi ovale.
2. Underlying age-related cerebral atrophy with moderate chronic
microvascular ischemic disease, with a few scattered remote lacunar
infarcts involving the hemispheric cerebral white matter, basal
ganglia, right thalamus, and right cerebellum.

MRA HEAD:

Normal intracranial MRA. No large vessel occlusion, hemodynamically
significant stenosis, or other acute vascular abnormality.

MRA NECK:

Negative MRA of the neck. No hemodynamically significant or critical
flow limiting stenosis.

## 2020-11-24 IMAGING — MR MR HEAD W/O CM
6 of 11 series · 24 of 48 positions shown · IV contrast (gadavist)
Comparison: Prior head CT from earlier the same day.

CLINICAL DATA: Initial evaluation for neuro deficit, stroke
suspected.

EXAM:
MRI HEAD WITHOUT CONTRAST
MRA HEAD WITHOUT CONTRAST
MRA NECK WITHOUT AND WITH CONTRAST
TECHNIQUE: Multiplanar, multi-echo pulse sequences of the brain and surrounding
structures were acquired without intravenous contrast. Angiographic
images of the Circle of Willis were acquired using MRA technique
without intravenous contrast. Angiographic images of the neck were
acquired using MRA technique without and with intravenous contrast.
Carotid stenosis measurements (when applicable) are obtained
utilizing NASCET criteria, using the distal internal carotid
diameter as the denominator.
CONTRAST:  8.5mL GADAVIST GADOBUTROL 1 MMOL/ML IV SOLN

[Series 2: DWI · axial · 3.0mm · 0.94mm/px · z∈[-91,+69]mm · 7 of 109 slices shown (1 of 2)]
[im 1/109]
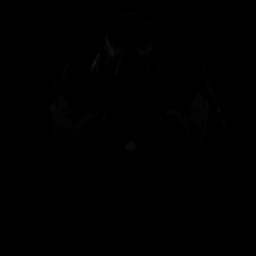
[im 19/109]
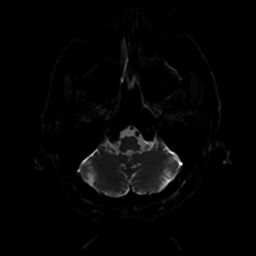
[im 37/109]
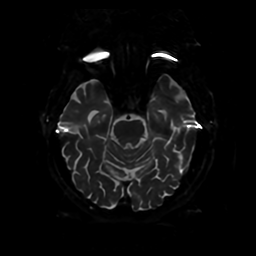
[im 55/109]
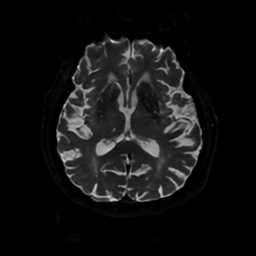
[im 73/109]
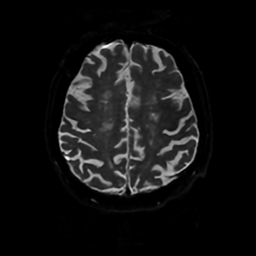
[im 91/109]
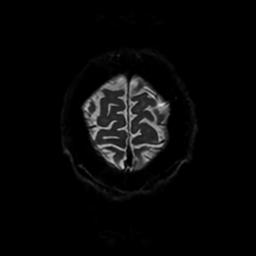
[im 109/109]
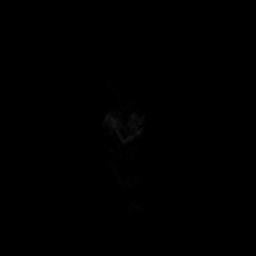

[Series 3: DWI · coronal · 4.0mm · 0.94mm/px · 5 of 71 slices shown (2 of 2)]
[im 1/71]
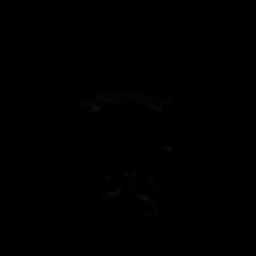
[im 18/71]
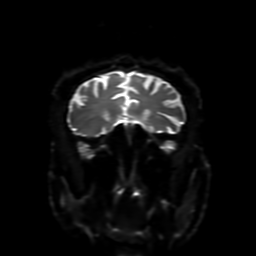
[im 36/71]
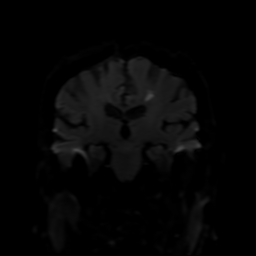
[im 53/71]
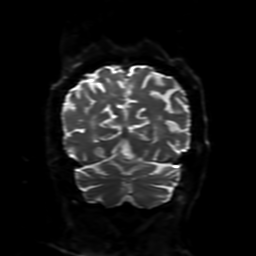
[im 71/71]
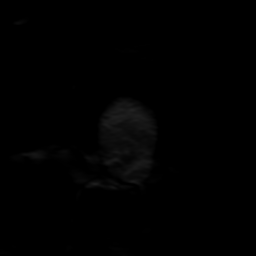

[Series 4: FLAIR · sagittal · 5.0mm · 0.23mm/px · 2 of 28 slices shown (1 of 2)]
[im 1/28]
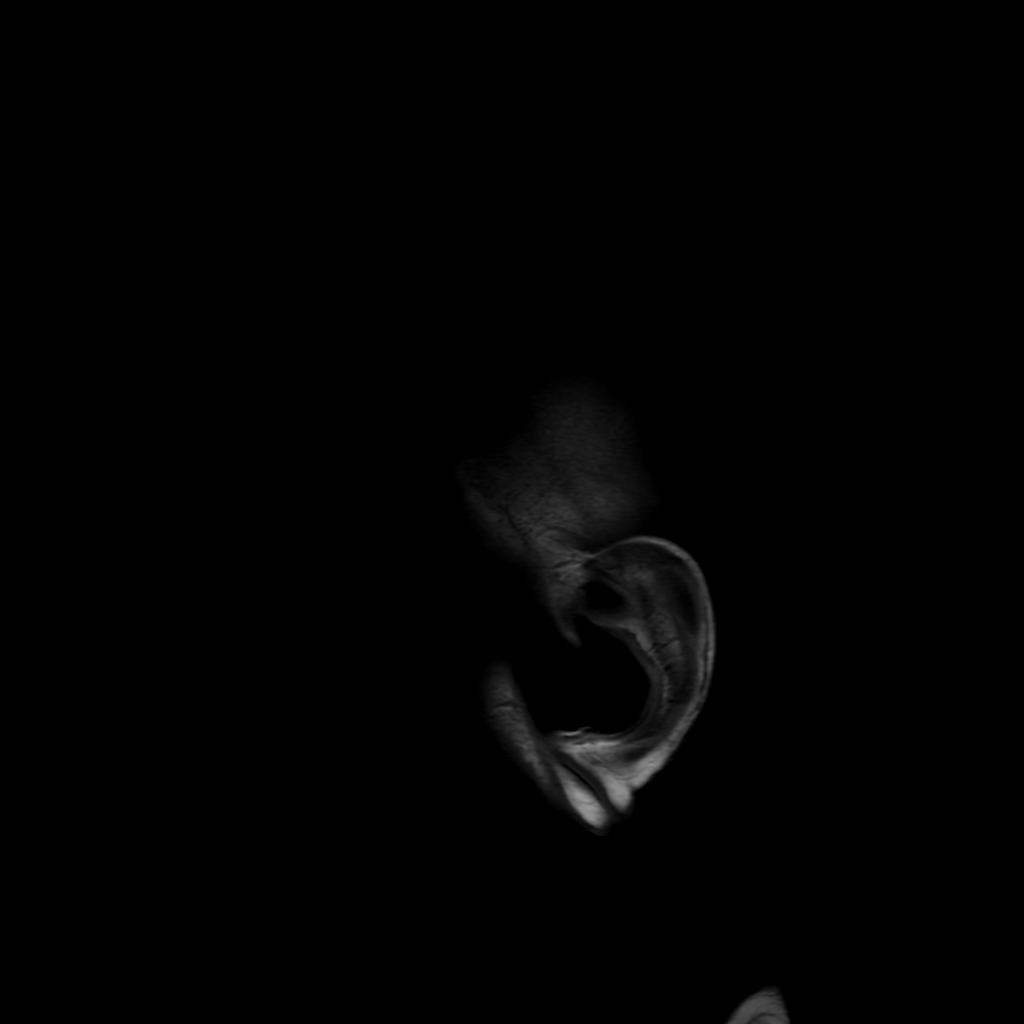
[im 28/28]
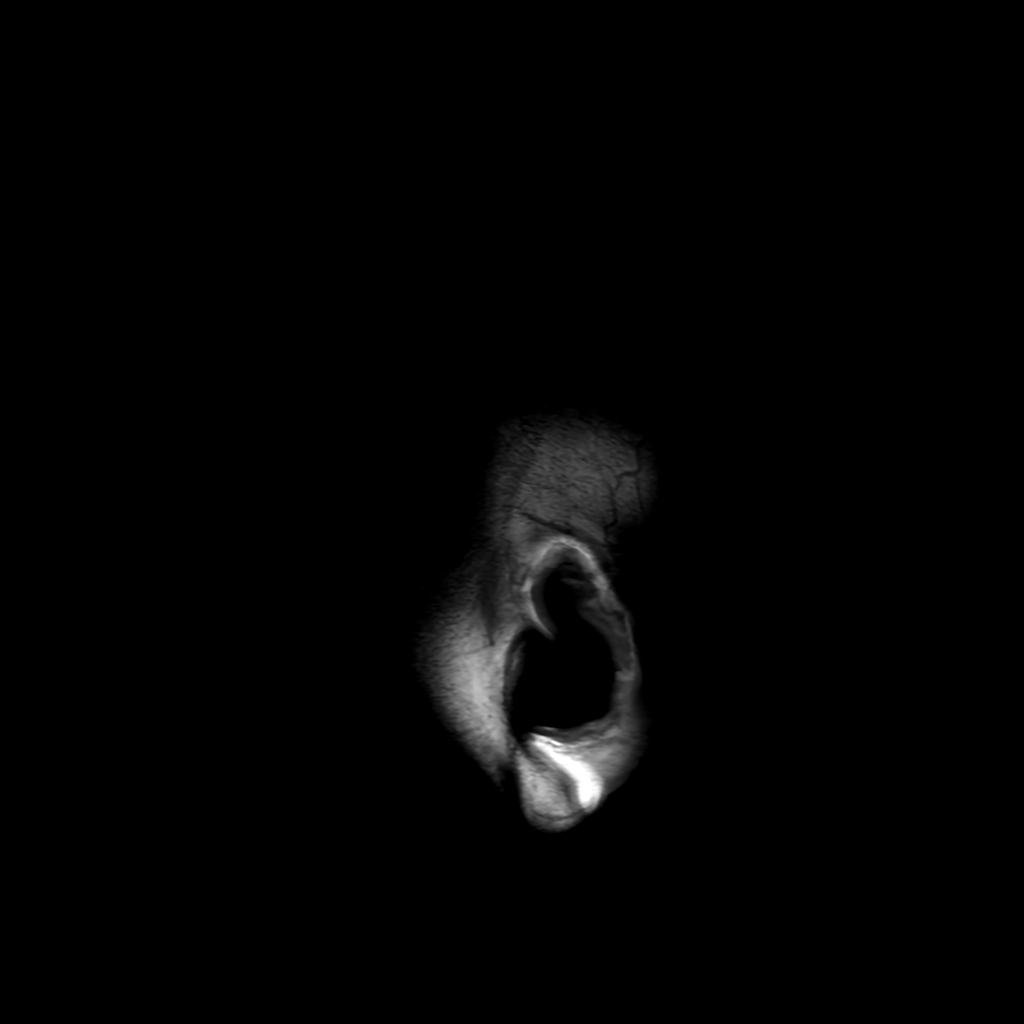

[Series 6: FLAIR · axial · 4.0mm · 0.45mm/px · z∈[-90,+71]mm · 3 of 38 slices shown (2 of 2)]
[im 1/38]
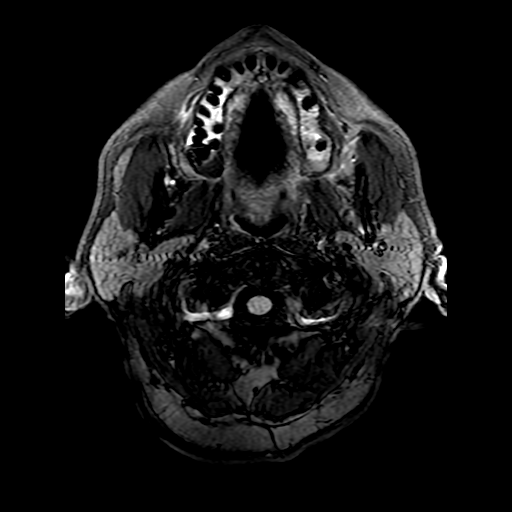
[im 19/38]
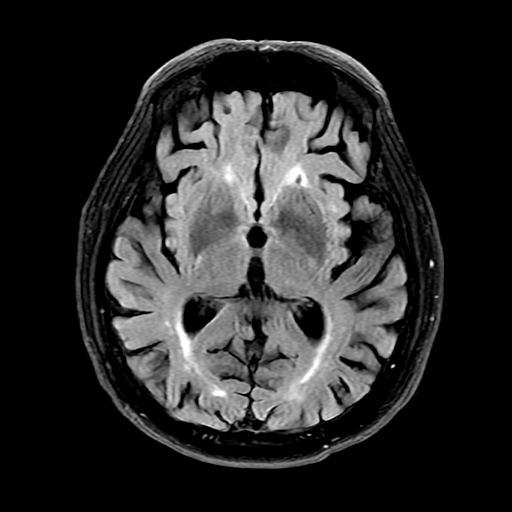
[im 38/38]
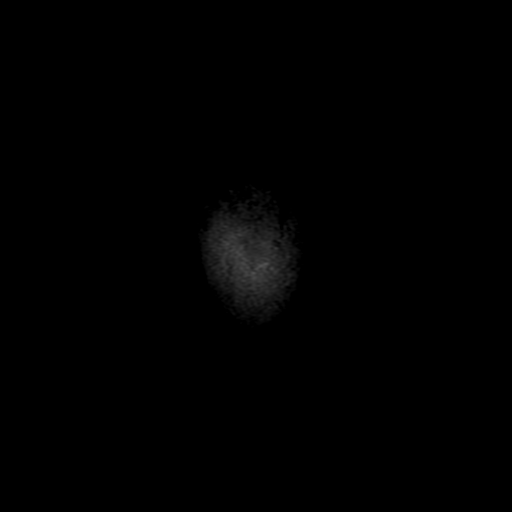

[Series 250: ADC · axial · 3.0mm · 0.94mm/px · z∈[-91,+69]mm · 4 of 55 slices shown (1 of 2)]
[im 1/55]
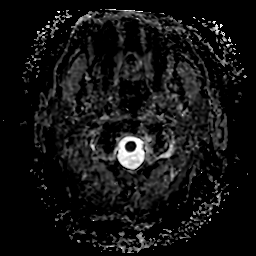
[im 19/55]
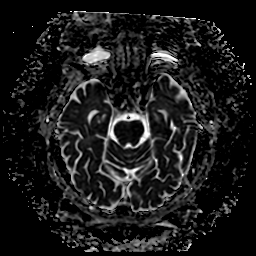
[im 37/55]
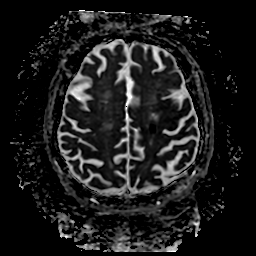
[im 55/55]
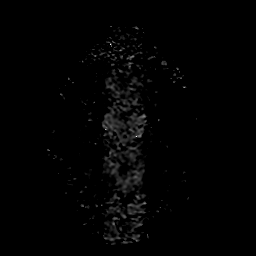

[Series 350: ADC · coronal · 4.0mm · 0.94mm/px · 3 of 36 slices shown (2 of 2)]
[im 1/36]
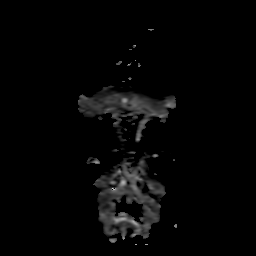
[im 18/36]
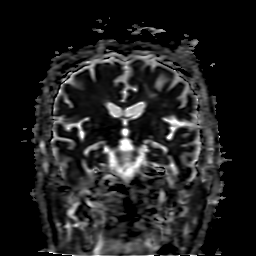
[im 36/36]
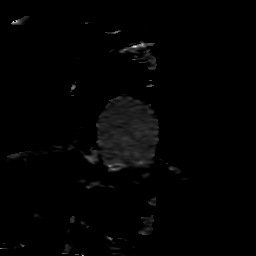

[24 of 48 positions shown; findings below may reference images not displayed]

FINDINGS: MRI HEAD FINDINGS

Brain: Diffuse prominence of the CSF containing spaces compatible
generalized age-related cerebral atrophy. Patchy and confluent
T2/FLAIR hyperintensity within the periventricular deep white matter
both cerebral hemispheres most consistent with chronic small vessel
ischemic disease, moderate in nature. Few scattered superimposed
remote lacunar infarcts present about the hemispheric cerebral white
matter. Few small remote lacunar infarcts present about the basal
ganglia and right thalamus. Few small remote right cerebellar
infarcts noted as well.

1 cm acute ischemic infarcts seen involving the deep white matter of
the posterior left frontal centrum semi ovale (series 2, image 37).
No associated hemorrhage or mass effect. No other evidence for acute
or subacute ischemia. Gray-white matter differentiation otherwise
maintained. No encephalomalacia to suggest chronic cortical
infarction elsewhere within the brain. No foci of susceptibility
artifact to suggest acute or chronic intracranial hemorrhage.

No mass lesion, midline shift or mass effect. No hydrocephalus or
extra-axial fluid collection. Pituitary gland suprasellar region
within normal limits. Midline structures intact.

Vascular: Major intracranial vascular flow voids are maintained.

Skull and upper cervical spine: Craniocervical junction within
normal limits. Bone marrow signal intensity normal. No scalp soft
tissue abnormality.

Sinuses/Orbits: Patient status post bilateral ocular lens
replacement. Globes and orbital soft tissues demonstrate no acute
finding. Mild scattered mucosal thickening noted within the
ethmoidal air cells. Paranasal sinuses are otherwise clear. No
mastoid effusion. Inner ear structures grossly normal.

Other: None.

MRA HEAD FINDINGS

Anterior circulation: Distal cervical segments of the internal
carotid arteries are patent with antegrade flow. Petrous, cavernous,
and supraclinoid segments patent without stenosis or other
abnormality. A1 segments widely patent. Normal anterior
communicating artery complex. Anterior cerebral arteries patent to
their distal aspects without stenosis. No M1 stenosis or occlusion.
Normal MCA bifurcations. Distal MCA branches well perfused and
symmetric.

Posterior circulation: Both V4 segments patent to the
vertebrobasilar junction without stenosis. Left vertebral artery
dominant. Both PICA origins patent and normal. Basilar patent to its
distal aspect without stenosis. Superior cerebellar arteries patent
bilaterally. Both PCAs primarily supplied via the basilar well
perfused or distal aspects.

Anatomic variants: None significant.  No aneurysm.

MRA NECK FINDINGS

Aortic arch: Visualized aortic arch normal in caliber with normal 3
vessel morphology. No hemodynamically significant stenosis seen
about the origin of the great vessels.

Right carotid system: Right CCA patent from its origin to the
bifurcation without stenosis. Minor atheromatous irregularity about
the right carotid bulb without stenosis. Right ICA widely patent
distally without stenosis, evidence for dissection or occlusion.

Left carotid system: Left CCA patent from its origin to the
bifurcation without stenosis. Minor atheromatous irregularity about
the left carotid bulb without stenosis. Left ICA patent distally
without stenosis, evidence for dissection or occlusion.

Vertebral arteries: Both vertebral arteries arise from the
subclavian arteries. Left vertebral artery dominant. Vertebral
arteries patent without stenosis, evidence for dissection or
occlusion.

Other: None
IMPRESSION: MRI HEAD:

1. 1 cm acute ischemic nonhemorrhagic infarct involving the deep
white matter of the posterior left frontal centrum semi ovale.
2. Underlying age-related cerebral atrophy with moderate chronic
microvascular ischemic disease, with a few scattered remote lacunar
infarcts involving the hemispheric cerebral white matter, basal
ganglia, right thalamus, and right cerebellum.

MRA HEAD:

Normal intracranial MRA. No large vessel occlusion, hemodynamically
significant stenosis, or other acute vascular abnormality.

MRA NECK:

Negative MRA of the neck. No hemodynamically significant or critical
flow limiting stenosis.

## 2020-11-24 IMAGING — CT CT HEAD W/O CM
4 series · 16 of 47 positions shown, 18 images · non-contrast
Comparison: None.

CLINICAL DATA: Transient ischemic attack

EXAM:
CT HEAD WITHOUT CONTRAST
TECHNIQUE: Contiguous axial images were obtained from the base of the skull
through the vertex without intravenous contrast.

[Series 3: head bone · axial · 0.45mm/px · z∈[+1352,+1384]mm · 3 of 81 slices shown]
[im 9/81  bone]
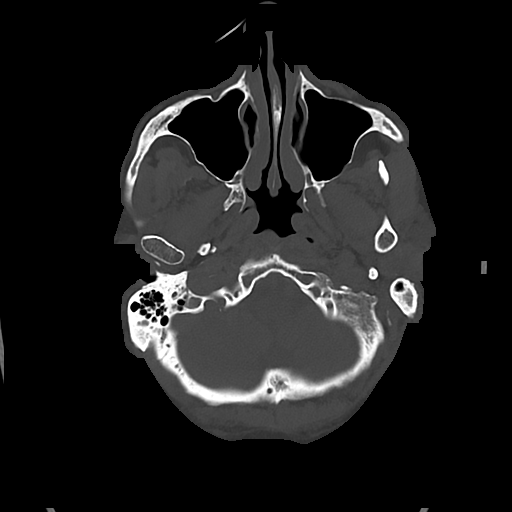
[im 17/81  bone]
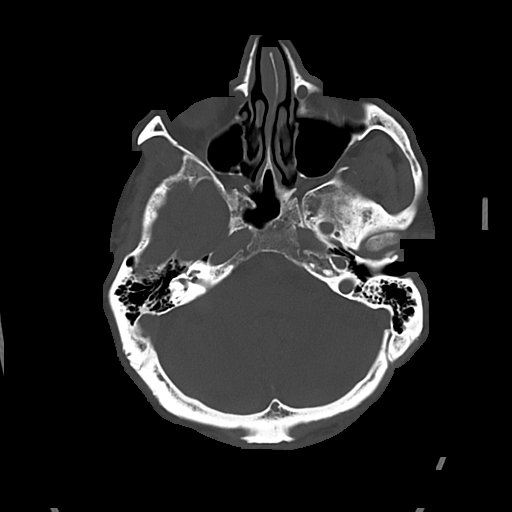
[im 25/81  bone]
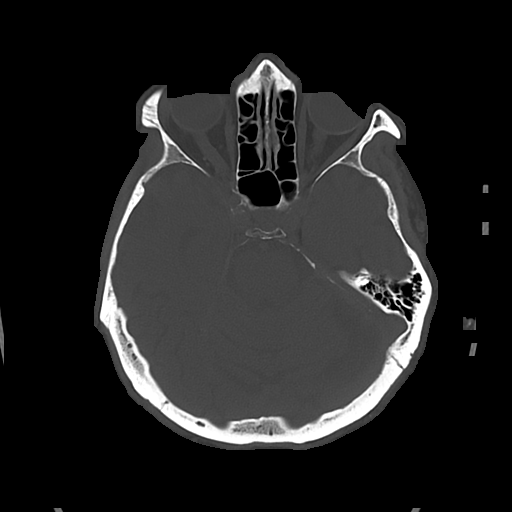

[Series 4: head wo · axial · 0.45mm/px · z∈[+1356,+1476]mm · 7 of 33 slices shown, 9 images]
[im 5/33  brain]
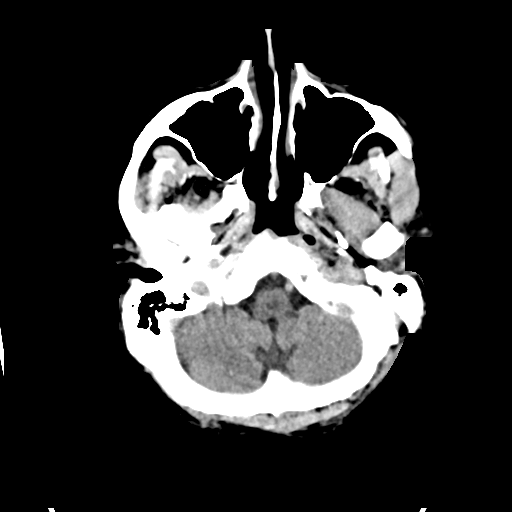
[im 5/33  bone]
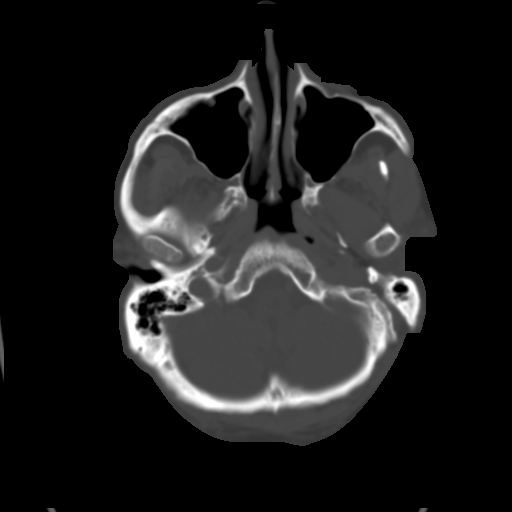
[im 9/33  brain]
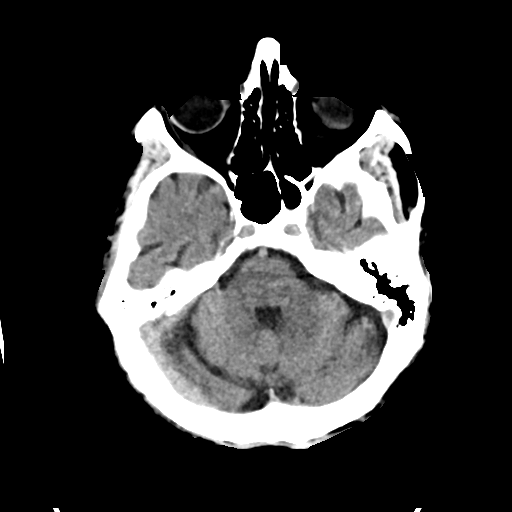
[im 13/33  brain]
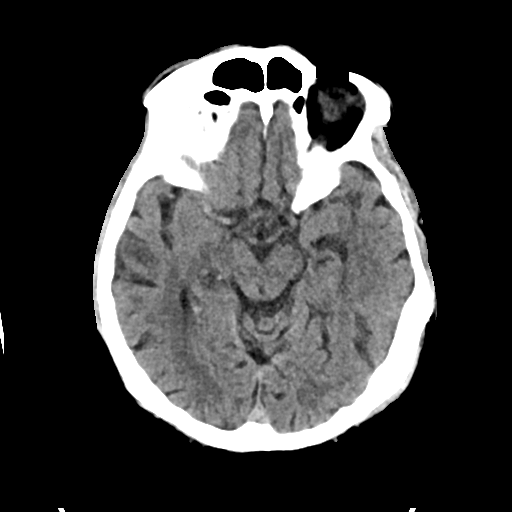
[im 17/33  brain]
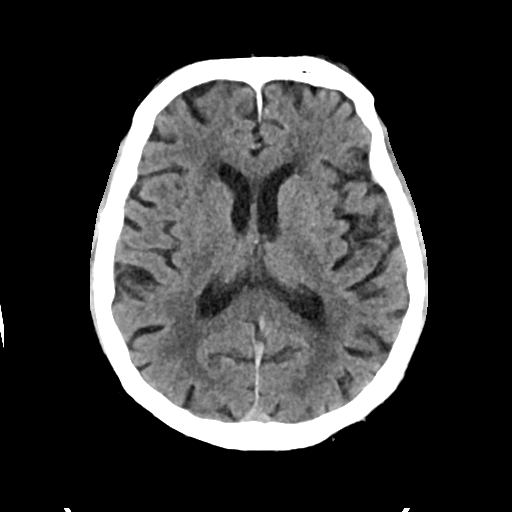
[im 21/33  brain]
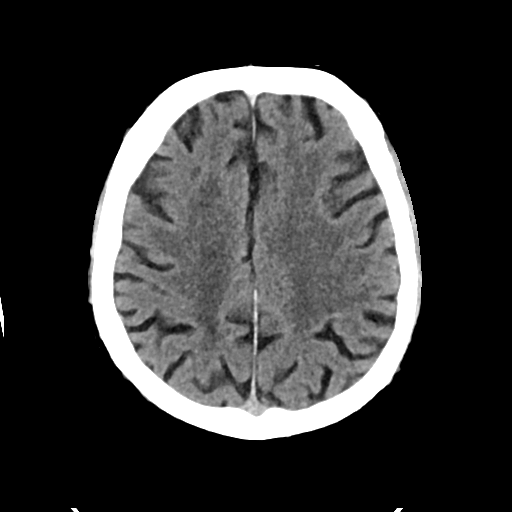
[im 21/33  bone]
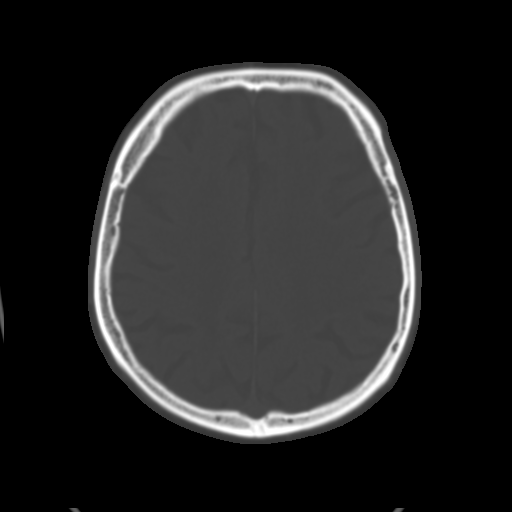
[im 25/33  brain]
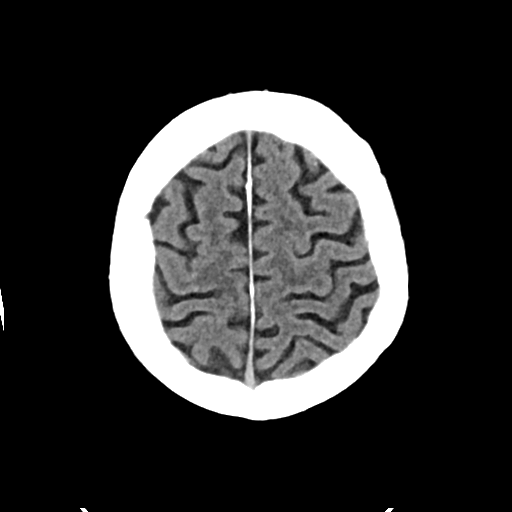
[im 29/33  brain]
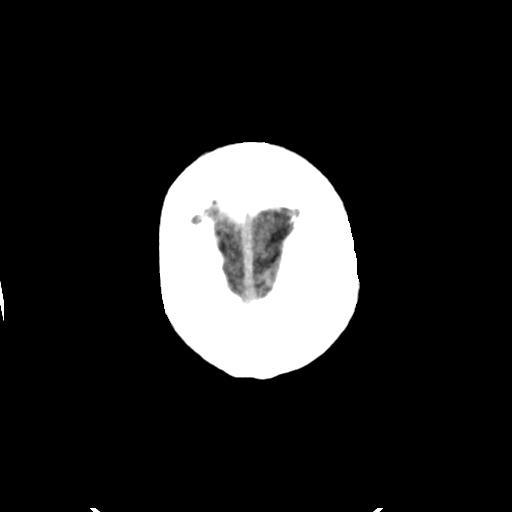

[Series 5: cor soft · coronal · 0.35mm/px · 3 of 66 slices shown]
[im 22/66  brain]
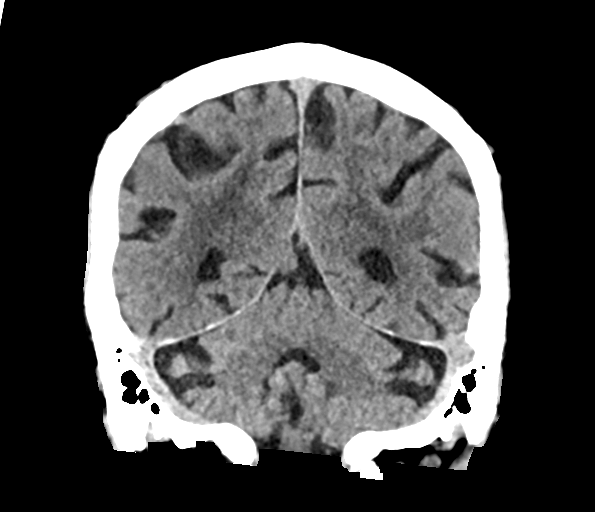
[im 29/66  brain]
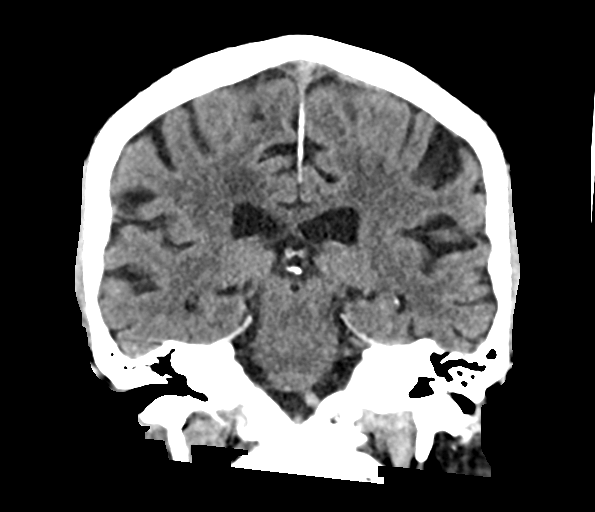
[im 37/66  brain]
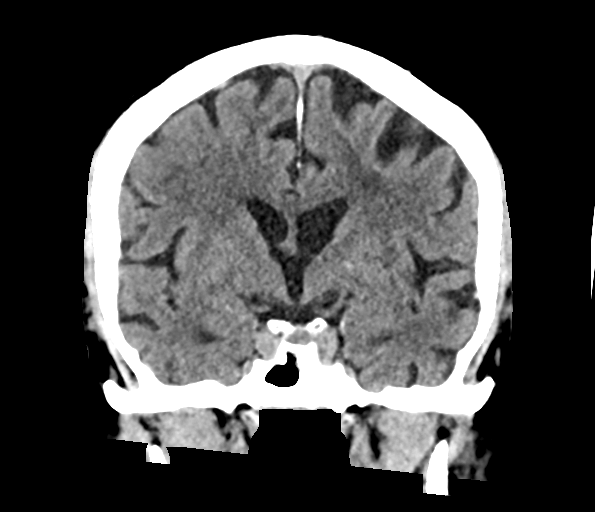

[Series 6: sag soft · sagittal · 0.35mm/px · 3 of 56 slices shown]
[im 19/56  brain]
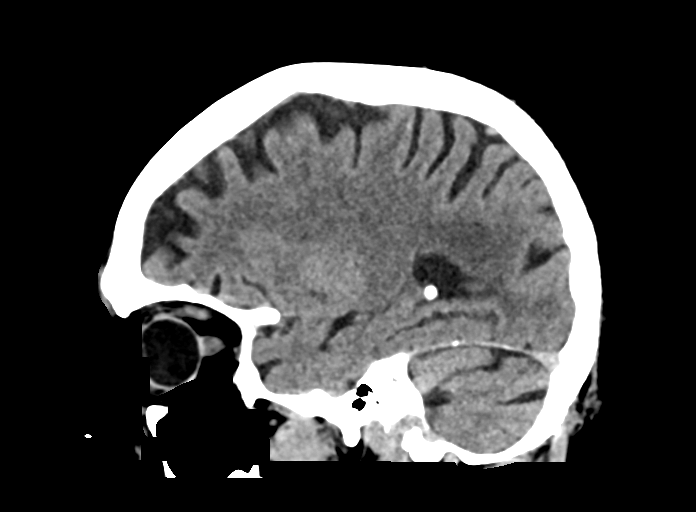
[im 28/56  brain]
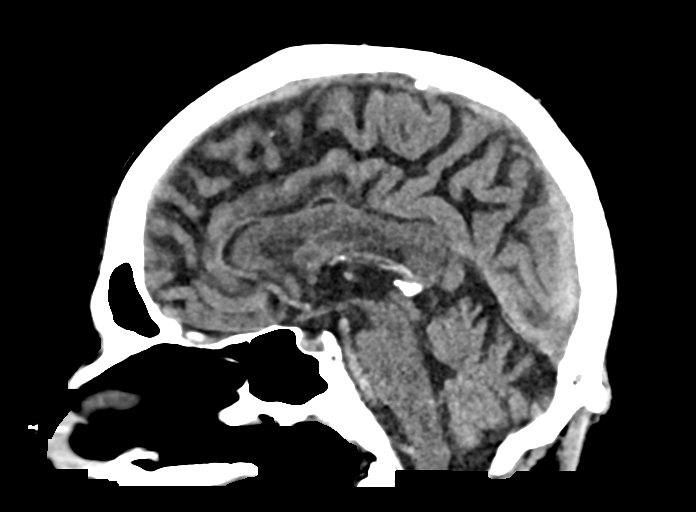
[im 37/56  brain]
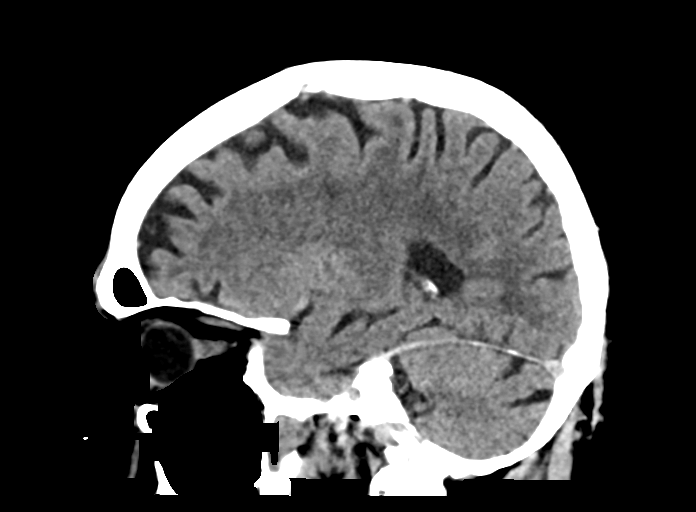

[16 of 47 positions shown; findings below may reference images not displayed]

FINDINGS: Brain: No acute intracranial hemorrhage. No focal mass lesion. No CT
evidence of acute infarction. No midline shift or mass effect. No
hydrocephalus. Basilar cisterns are patent.

There are periventricular and subcortical white matter
hypodensities. Generalized cortical atrophy.

Vascular: No hyperdense vessel or unexpected calcification.

Skull: Normal. Negative for fracture or focal lesion.

Sinuses/Orbits: Paranasal sinuses and mastoid air cells are clear.
Orbits are clear.

Other: None.
IMPRESSION: No acute intracranial findings.

Moderate atrophy and white matter microvascular disease.

## 2020-11-24 MED ORDER — GADOBUTROL 1 MMOL/ML IV SOLN
8.5000 mL | Freq: Once | INTRAVENOUS | Status: AC | PRN
  Administered 2020-11-24: 8.5 mL via INTRAVENOUS

## 2020-11-24 MED ORDER — ACETAMINOPHEN 325 MG PO TABS: 650.0000 mg | ORAL_TABLET | Freq: Four times a day (QID) | ORAL | Status: DC | PRN

## 2020-11-24 MED ORDER — STROKE: EARLY STAGES OF RECOVERY BOOK: Freq: Once | Status: DC

## 2020-11-24 MED ORDER — POLYETHYLENE GLYCOL 3350 17 G PO PACK: 17.0000 g | PACK | Freq: Every day | ORAL | Status: DC | PRN

## 2020-11-24 MED ORDER — ATORVASTATIN CALCIUM 80 MG PO TABS
80.0000 mg | ORAL_TABLET | Freq: Every day | ORAL | Status: DC
  Administered 2020-11-25: 80 mg via ORAL
  Filled 2020-11-24 (×2): qty 2

## 2020-11-24 MED ORDER — LABETALOL HCL 5 MG/ML IV SOLN: 5.0000 mg | INTRAVENOUS | Status: DC | PRN

## 2020-11-24 MED ORDER — APIXABAN 5 MG PO TABS
5.0000 mg | ORAL_TABLET | Freq: Two times a day (BID) | ORAL | Status: DC
  Administered 2020-11-25 (×2): 5 mg via ORAL
  Filled 2020-11-24 (×2): qty 1

## 2020-11-24 MED ORDER — EZETIMIBE 10 MG PO TABS
10.0000 mg | ORAL_TABLET | Freq: Every evening | ORAL | Status: DC
  Administered 2020-11-25: 10 mg via ORAL
  Filled 2020-11-24: qty 1

## 2020-11-24 MED ORDER — ONDANSETRON HCL 4 MG/2ML IJ SOLN: 4.0000 mg | Freq: Four times a day (QID) | INTRAMUSCULAR | Status: DC | PRN

## 2020-11-24 MED ORDER — MELATONIN 3 MG PO TABS: 3.0000 mg | ORAL_TABLET | Freq: Every evening | ORAL | Status: DC | PRN

## 2020-11-24 NOTE — ED Provider Notes (Signed)
[facility] EMERGENCY DEPARTMENT Provider Note   CSN: [id number] Arrival date & time: 11/24/20  0931     History Chief Complaint  Patient presents with   Extremity Weakness    Eric [name] is a 87 y.o. male.   Extremity Weakness   This patient is a 87 year old male, he has a history of atrial flutter which has been paroxysmal, he had a successful cardioversion in 2015.  He is on Eliquis due to having multiple episodes of DVTs and pulmonary embolism as well as atrial fibrillation.  He also has a history of bladder cancer status posttreatment, history of multiple cardiac surgeries including multiple valve replacements as well as a repeat valve replacement.  He states that last night he was in his usual state of health at the dinner table when he noticed that he could not hold the right hand normally in fact the fork was not working correctly with his hand.  He went to sit down in the other room and noticed that both the right arm and the right leg were feeling numb and weak.  This gradually improved and he was able to get to bed however when he got up to use the bathroom overnight he could not walk straight, he was having to hold onto the walls because of his right leg feeling weak.  This is very unusual, he had no difficulty with speech or vision and feels like at this time his symptoms are back to normal except for his right leg which still feels a bit weak and his inability to walk per normal.  He has had to use a walker to get around today which he never has to use.  He has no headache, no visual changes, no chest pain or shortness of breath, no fever chills nausea vomiting or diarrhea.  He has some mild chronic swelling of his leg secondary to the prior history of DVTs.  He is very compliant with taking his Eliquis.  He follows with cardiology Dr. Irish Lack  Past Medical History:  Diagnosis Date   Atrial flutter, paroxysmal ([place])    a. dx 04-02-2014, s/p  successful  cardioversion 04-06-2014.   Chronic anticoagulation    on Eliquis--  due to recurrent dvt's and pe's   Dyslipidemia    History of bladder cancer urologist-  dr [name]   02-13-2016  s/p TURBT per path high grade papillary urothelial carcinoma   History of DVT (deep vein thrombosis)    2000-- RLE   History of melanoma excision    left flank;  07/ 2014 left lower leg and right upper chest   History of prostate cancer    Gleason 6--  s/p  radical prostatectomy 06/ 2000 in [place]---  no recurrence   History of pulmonary embolus (PE)    2000 & 2005  bilateral   Hx of valvuloplasty 06/19/2007   a. 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 05/2007 CABG x 1: s/p SVG-OM;  b. 10/2010 Ex MV: EF 72%, inf attenuation w/o ischemia, brief run of PAT with exercise. 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/25/2018   History of bladder cancer    History of pulmonary embolus (PE)    Acute on chronic diastolic heart failure ([place]) 04/23/2018   History of aortic valve replacement 04/23/2018   Atrial fibrillation ([place]) 04/23/2018   Coronary atherosclerosis of native coronary artery 02/25/2014   Hyperlipidemia 02/25/2014   Embolism and thrombosis ([place]) 03/11/2013    Past Surgical History:  Procedure Laterality Date   AORTIC VALVE REPLACEMENT (AVR)/CORONARY ARTERY BYPASS GRAFTING (CABG)  06-19-2007  dr [name]   SVG to OM1 ;   AVR w/ #23 Mitralflow  pericardial and ligation left atrial appendage;  MV repair w/ #28 Sorin 3-D memo Ring Annuloplasty with repair ruptured chordar of P-2 flail segments   CARDIAC CATHETERIZATION  05-12-2007  dr Leonia Reeves   single vessel 30% ostial LAD,  80% LCx;  severe to critial AS,  moderate MR,  normal LVF, ef 70%,  normal right heart pressures and cardiac outputs,  mild elevated LV end-diastolic pressure     CARDIOVASCULAR STRESS TEST  11-02-2010  dr Irish Lack   normal nuclear study w/ no ischemia or infarct/scar/  normal LV function and wall motion , ef 72%   CARDIOVERSION N/A 04/06/2014   Procedure: CARDIOVERSION;  Surgeon: [name], MD;  Location: [place];  Service: Cardiovascular;  Laterality: N/A;   successful   CATARACT EXTRACTION W/ INTRAOCULAR LENS  IMPLANT, BILATERAL  2012  approx   CORONARY ARTERY BYPASS GRAFT     RETROPUBIC RADICAL PROSTATECTOMY  06/ 2000  in [place], [place]   RIGHT/LEFT HEART CATH AND CORONARY/GRAFT ANGIOGRAPHY N/A 10/29/2018   Procedure: RIGHT/LEFT HEART CATH AND CORONARY/GRAFT ANGIOGRAPHY;  Surgeon: [name], MD;  Location: [place] CV LAB;  Service: Cardiovascular;  Laterality: N/A;   ROTATOR CUFF REPAIR Left 09/1998   TEE WITHOUT CARDIOVERSION N/A 04/06/2014   Procedure: TRANSESOPHAGEAL ECHOCARDIOGRAM (TEE);  Surgeon: [name], MD;  Location: [facility] ENDOSCOPY;  Service: Cardiovascular;  Laterality: N/A;  mild focal basa LVH of the septum, ef 50-55%, s/p AV annuloplasty ring, elongated chordae, thickened leaflets, mild to mod. MR, multiple small jets/arotic bioprosthetic valve sits well in position, mild AI/ thickened TV w/ mild reurg./mild LA   TEE WITHOUT CARDIOVERSION N/A 10/22/2018   Procedure: TRANSESOPHAGEAL ECHOCARDIOGRAM (TEE);  Surgeon: [name], MD;  Location: [facility] ENDOSCOPY;  Service: Cardiovascular;  Laterality: N/A;   TEE WITHOUT CARDIOVERSION N/A 11/25/2018   Procedure: TRANSESOPHAGEAL ECHOCARDIOGRAM (TEE);  Surgeon: [name], MD;   Location: [place];  Service: Open Heart Surgery;  Laterality: N/A;   TRANSCATHETER AORTIC VALVE REPLACEMENT, TRANSFEMORAL N/A 11/25/2018   Procedure: TRANSCATHETER AORTIC VALVE REPLACEMENT, TRANSFEMORAL;  Surgeon: [name], MD;  Location: [place];  Service: Open Heart Surgery;  Laterality: N/A;   TRANSTHORACIC ECHOCARDIOGRAM  03-23-2016   dr Irish Lack   EF 55-60%, reduced contribution atrial contraction to ventricular filling, due to increased ventricular diastolic pressure or atrial contratile dysfunction/  bioprosthetic AV w/ mod. regurg. (valve area 1.97cm^2)/ mild dilated ascending aorta/ mild thicken MV normal function annular ring prosthesis/severe LAE & mod. to sev.RAE/ PASP 54mmHg/ mild TR/ ventricle septal motion show paradox   TRANSURETHRAL RESECTION OF BLADDER TUMOR N/A 02/13/2016   Procedure: TRANSURETHRAL RESECTION OF BLADDER TUMOR (TURBT);  Surgeon: Nickie Retort, MD;  Location: [place];  Service: Urology;  Laterality: N/A;   TRANSURETHRAL RESECTION OF BLADDER TUMOR N/A 09/04/2016   Procedure: TRANSURETHRAL RESECTION OF BLADDER TUMOR (TURBT);  Surgeon: Nickie Retort, MD;  Location: [facility];  Service: Urology;  Laterality: N/A;       Family History  Problem Relation Age of Onset   Leukemia Mother    Supraventricular tachycardia Sister    Heart attack Neg Hx    Stroke Neg Hx    Hypertension Neg Hx     Social History   Tobacco Use   Smoking status: Never   Smokeless tobacco: Never  Substance Use Topics   Alcohol use: Yes    Alcohol/week: 1.0 - 2.0 standard drink    Types: 1 - 2 Standard drinks or equivalent per week    Comment: OCC   Drug use: No    Home Medications Prior to Admission medications   Medication Sig Start Date End Date Taking? Authorizing Provider  amoxicillin (AMOXIL) 500 MG tablet Take 4 tablets (2,000 mg total) by mouth as directed. Take 1 hour prior to dental work, including cleanings. 04/27/19   Jettie Booze, MD  atorvastatin (LIPITOR) 40 MG tablet TAKE 1 TABLET BY MOUTH IN  THE EVENING 03/31/20   Jettie Booze, MD  ELIQUIS 5 MG TABS tablet TAKE 1 TABLET BY MOUTH  TWICE DAILY 08/19/20   Jettie Booze, MD  ezetimibe (ZETIA) 10 MG tablet TAKE 1 TABLET BY MOUTH IN  THE EVENING 06/02/20   Jettie Booze, MD  furosemide (LASIX) 20 MG tablet TAKE 1 TABLET BY MOUTH  EVERY OTHER DAY, MAY TAKE 1 ADDITIONAL DOSE IF NEEDED  FOR LEG SWELLING 04/04/20   Jettie Booze, MD  losartan (COZAAR) 25 MG tablet Take 1 tablet daily AS NEEDED. Take 1 tablet if Systolic BP >622. Hold if Systolic BP <297. 98/92/11   Jettie Booze, MD    Allergies    Patient has no known allergies.  Review of Systems   Review of Systems  Musculoskeletal:  Positive for extremity weakness.  All other systems reviewed and are negative.  Physical Exam Updated Vital Signs BP (!) 188/114   Pulse 81   Temp 98.5 F (36.9 C) (Oral)   Resp (!) 25   SpO2 99%   Physical Exam Vitals and nursing note reviewed.  Constitutional:      General: He is not in acute distress.    Appearance: He is well-developed.  HENT:     Head: Normocephalic and atraumatic.     Mouth/Throat:     Pharynx: No oropharyngeal exudate.  Eyes:     General: No scleral icterus.       Right eye: No discharge.        Left eye: No discharge.     Conjunctiva/sclera: Conjunctivae normal.     Pupils: Pupils are equal, round, and reactive to light.  Neck:     Thyroid: No thyromegaly.     Vascular: No JVD.  Cardiovascular:     Rate and Rhythm: Normal rate. Rhythm irregular.     Heart sounds: Normal heart sounds. No murmur heard.   No friction rub. No gallop.  Pulmonary:     Effort: Pulmonary effort is normal. No respiratory distress.     Breath sounds: Normal breath sounds. No wheezing or rales.  Abdominal:     General: Bowel sounds are normal. There is no distension.     Palpations: Abdomen is  soft. There is no mass.      Tenderness: There is no abdominal tenderness.  Musculoskeletal:        General: No tenderness. Normal range of motion.     Cervical back: Normal range of motion and neck supple.     Right lower leg: Edema present.     Left lower leg: No edema.  Lymphadenopathy:     Cervical: No cervical adenopathy.  Skin:    General: Skin is warm and dry.     Findings: No erythema or rash.  Neurological:     Mental Status: He is alert.     Coordination: Coordination normal.     Comments: In the supine position the patient has a mild tremor of his upper extremities this is symmetrical.  He has normal cranial nerves III through XII, normal peripheral visual fields, normal speech, no facial droop.  He has totally normal strength and sensation in all 4 extremities.  He is able to straight leg raise bilaterally against resistance, normal strength in all major muscle groups of the arms and legs.  He has totally normal finger-nose-finger bilaterally, no pronator drift, he has totally normal heel shin bilaterally as well.  The patient's speech is clear and goal-directed.  Psychiatric:        Behavior: Behavior normal.    ED Results / Procedures / Treatments   Labs (all labs ordered are listed, but only abnormal results are displayed) Labs Reviewed  PROTIME-INR - Abnormal; Notable for the following components:      Result Value   Prothrombin Time 16.2 (*)    INR 1.3 (*)    All other components within normal limits  COMPREHENSIVE METABOLIC PANEL - Abnormal; Notable for the following components:   Glucose, Bld 123 (*)    Total Bilirubin 3.9 (*)    All other components within normal limits  URINALYSIS, ROUTINE W REFLEX MICROSCOPIC - Abnormal; Notable for the following components:   Leukocytes,Ua TRACE (*)    Bacteria, UA RARE (*)    All other components within normal limits  I-STAT CHEM 8, ED - Abnormal; Notable for the following components:   Glucose, Bld 124 (*)    All other components within normal  limits  RESP PANEL BY RT-PCR (FLU A&B, COVID) [id number]  ETHANOL  APTT  CBC  DIFFERENTIAL  RAPID URINE DRUG SCREEN, HOSP PERFORMED    EKG None  Radiology CT HEAD WO CONTRAST  Result Date: 11/24/2020 CLINICAL DATA:  Transient ischemic attack EXAM: CT HEAD WITHOUT CONTRAST TECHNIQUE: Contiguous axial images were obtained from the base of the skull through the vertex without intravenous contrast. COMPARISON:  None. FINDINGS: Brain: No acute intracranial hemorrhage. No focal mass lesion. No CT evidence of acute infarction. No midline shift or mass effect. No hydrocephalus. Basilar cisterns are patent. There are periventricular and subcortical white matter hypodensities. Generalized cortical atrophy. Vascular: No hyperdense vessel or unexpected calcification. Skull: Normal. Negative for fracture or focal lesion. Sinuses/Orbits: Paranasal sinuses and mastoid air cells are clear. Orbits are clear. Other: None. IMPRESSION: No acute intracranial findings. Moderate atrophy and white matter microvascular disease. Electronically Signed   By: Suzy Bouchard M.D.   On: 11/24/2020 10:59    Procedures Procedures   Medications Ordered in ED Medications - No data to display  ED Course  I have reviewed the triage vital signs and the nursing notes.  Pertinent labs & imaging results that were available during my care of the patient were reviewed by me and considered  in my medical decision making (see chart for details).    MDM Rules/Calculators/A&P                          The patient is on Eliquis, he has had symptoms of an acute stroke, he has some persistent difficulty with ambulation which I suspect is some residual ischemia.  Unfortunately his symptoms have not been going on for around 24 hours and they initially started at 730 last night and it is now 7:15 PM.  He does not have any signs of a large vessel occlusion, I will consult with neurology, the patient will likely need to have further stroke  work-up.  This case was discussed with the neurologist on-call who agrees that this patient likely needs to be admitted for further work-up including an MRI given the waxing and waning symptoms, the patient's blood pressure remains mild to moderately elevated, no aggressive therapy but permissive hypertension at this time.  No fever, CT scan shows no acute findings, the patient is agreeable to the plan for admission  The MRI scan shows an acute area of 1 cm acute ischemic infarct involving the deep matter of the posterior left frontal centrum semiovale.  Final Clinical Impression(s) / ED Diagnoses Final diagnoses:  Acute ischemic stroke [facility])    Rx / DC Orders ED Discharge Orders     None        Noemi Chapel, MD 11/25/20 1320

## 2020-11-24 NOTE — Consult Note (Signed)
NEUROLOGY CONSULTATION NOTE   Date of service: November 24, 2020 Patient Name: Eric [name] MRN:  [id number] DOB:  July 24, 1929 Reason for consult: "R sided weakness" Requesting Provider: Kayleen Memos, DO _ _ _   _ __   _ __ _ _  __ __   _ __   __ _  History of Present Illness  Eric [name] is a 87 y.o. 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  Yesterday evening around 1930, had trouble with holding the fork in his R hand. Attempted to go to the bathroom last night and trouble with R leg. Told his wife about it this morning and was brought in to the ED.  Symptoms have mostly resolved, still has some difficulty with balancing.  He is on eliquis and did not miss any doses.  mRS: 0 tPA/Thrombectomy: outside window for tPA, symptoms are too mild NIHSS components Score: Comment  1a Level of Conscious 0[x]  1[]  2[]  3[]      1b LOC Questions 0[x]  1[]  2[]       1c LOC Commands 0[x]  1[]  2[]       2 Best Gaze 0[x]  1[]  2[]       3 Visual 0[x]  1[]  2[]  3[]      4 Facial Palsy 0[x]  1[]  2[]  3[]      5a Motor Arm - left 0[x]  1[]  2[]  3[]  4[]  UN[]    5b Motor Arm - Right 0[x]  1[]  2[]  3[]  4[]  UN[]    6a Motor Leg - Left 0[x]  1[]  2[]  3[]  4[]  UN[]    6b Motor Leg - Right 0[x]  1[]  2[]  3[]  4[]  UN[]    7 Limb Ataxia 0[x]  1[]  2[]  3[]  UN[]     8 Sensory 0[x]  1[]  2[]  UN[]      9 Best Language 0[x]  1[]  2[]  3[]      10 Dysarthria 0[x]  1[]  2[]  UN[]      11 Extinct. and Inattention 0[x]  1[]  2[]       TOTAL: 0      ROS   Constitutional Denies weight loss, fever and chills.   HEENT Denies changes in vision and hearing.   Respiratory Denies SOB and cough.   CV Denies palpitations and CP   GI Denies abdominal pain, nausea, vomiting and diarrhea.   GU Denies dysuria and urinary frequency.   MSK Denies myalgia and joint pain.   Skin Denies rash and pruritus.   Neurological Denies headache and syncope.    Psychiatric Denies recent changes in mood. Denies anxiety and depression.    Past History   Past Medical History:  Diagnosis Date  . Atrial flutter, paroxysmal ([place])    a. dx 04-02-2014, s/p  successful cardioversion 04-06-2014.  Marland Kitchen Chronic anticoagulation    on Eliquis--  due to recurrent dvt's and pe's  . Dyslipidemia   . History of bladder cancer urologist-  dr [name]   02-13-2016  s/p TURBT per path high grade papillary urothelial carcinoma  . History of DVT (deep vein thrombosis)    2000-- RLE  . History of melanoma excision    left flank;  07/ 2014 left lower leg and right upper chest  . History of prostate cancer    Gleason 6--  s/p  radical prostatectomy 06/ 2000 in [place]---  no recurrence  . History of pulmonary embolus (PE)    2000 & 2005  bilateral  . Hx of valvuloplasty 06/19/2007   a.  s/p mitral ring annuloplasty, repair ruptured chordae of P2 flail segments of MV  . S/P aortic valve replacement with bioprosthetic valve    06-19-2007  severe aortic valve stenosis  . 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 Single vessel coronary artery disease   cardiologist-  dr Irish Lack   a. 05/2007 CABG x 1: s/p SVG-OM;  b. 10/2010 Ex MV: EF 72%, inf attenuation w/o ischemia, brief run of PAT with exercise. To  . Thrombocytopenia ([place])   . Thyroid goiter 2013   nodular  . Wears hearing aid    BILATERAL  . Wears partial dentures    UPPER   Past Surgical History:  Procedure Laterality Date  . AORTIC VALVE REPLACEMENT (AVR)/CORONARY ARTERY BYPASS GRAFTING (CABG)  06-19-2007  dr [name]   SVG to OM1 ;   AVR w/ #23 Mitralflow pericardial and ligation left atrial appendage;  MV repair w/ #28 Sorin 3-D memo Ring Annuloplasty with repair ruptured chordar of P-2 flail segments  . CARDIAC CATHETERIZATION  05-12-2007  dr Leonia Reeves   single vessel 30% ostial LAD,  80% LCx;  severe to critial AS,  moderate MR,  normal LVF, ef 70%,  normal right heart pressures and cardiac outputs,  mild  elevated LV end-diastolic pressure    . CARDIOVASCULAR STRESS TEST  11-02-2010  dr Irish Lack   normal nuclear study w/ no ischemia or infarct/scar/  normal LV function and wall motion , ef 72%  . CARDIOVERSION N/A 04/06/2014   Procedure: CARDIOVERSION;  Surgeon: [name], MD;  Location: [facility];  Service: Cardiovascular;  Laterality: N/A;   successful  . CATARACT EXTRACTION W/ INTRAOCULAR LENS  IMPLANT, BILATERAL  2012  approx  . CORONARY ARTERY BYPASS GRAFT    . RETROPUBIC RADICAL PROSTATECTOMY  06/ 2000  in [place], [place]  . RIGHT/LEFT HEART CATH AND CORONARY/GRAFT ANGIOGRAPHY N/A 10/29/2018   Procedure: RIGHT/LEFT HEART CATH AND CORONARY/GRAFT ANGIOGRAPHY;  Surgeon: [name], MD;  Location: [facility];  Service: Cardiovascular;  Laterality: N/A;  . ROTATOR CUFF REPAIR Left 09/1998  . TEE WITHOUT CARDIOVERSION N/A 04/06/2014   Procedure: TRANSESOPHAGEAL ECHOCARDIOGRAM (TEE);  Surgeon: [name], MD;  Location: [facility] ENDOSCOPY;  Service: Cardiovascular;  Laterality: N/A;  mild focal basa LVH of the septum, ef 50-55%, s/p AV annuloplasty ring, elongated chordae, thickened leaflets, mild to mod. MR, multiple small jets/arotic bioprosthetic valve sits well in position, mild AI/ thickened TV w/ mild reurg./mild LA  . TEE WITHOUT CARDIOVERSION N/A 10/22/2018   Procedure: TRANSESOPHAGEAL ECHOCARDIOGRAM (TEE);  Surgeon: [name], MD;  Location: [facility] ENDOSCOPY;  Service: Cardiovascular;  Laterality: N/A;  . TEE WITHOUT CARDIOVERSION N/A 11/25/2018   Procedure: TRANSESOPHAGEAL ECHOCARDIOGRAM (TEE);  Surgeon: [name], MD;  Location: [facility];  Service: Open Heart Surgery;  Laterality: N/A;  . TRANSCATHETER AORTIC VALVE REPLACEMENT, TRANSFEMORAL N/A 11/25/2018   Procedure: TRANSCATHETER AORTIC VALVE REPLACEMENT, TRANSFEMORAL;  Surgeon: [name], MD;  Location: [facility];  Service: Open Heart Surgery;  Laterality: N/A;  . TRANSTHORACIC ECHOCARDIOGRAM  03-23-2016   dr  Irish Lack   EF 55-60%, reduced contribution atrial contraction to ventricular filling, due to increased ventricular diastolic pressure or atrial contratile dysfunction/  bioprosthetic AV w/ mod. regurg. (valve area 1.97cm^2)/ mild dilated ascending aorta/ mild thicken MV normal function annular ring prosthesis/severe LAE & mod. to sev.RAE/ PASP 18mmHg/ mild TR/ ventricle septal motion show paradox  . TRANSURETHRAL RESECTION OF BLADDER TUMOR N/A 02/13/2016   Procedure: TRANSURETHRAL RESECTION OF BLADDER TUMOR (TURBT);  Surgeon:  Nickie Retort, MD;  Location[facility];  Service: Urology;  Laterality: N/A;  . TRANSURETHRAL RESECTION OF BLADDER TUMOR N/A 09/04/2016   Procedure: TRANSURETHRAL RESECTION OF BLADDER TUMOR (TURBT);  Surgeon: Nickie Retort, MD;  Location: [facility];  Service: Urology;  Laterality: N/A;   Family History  Problem Relation Age of Onset  . Leukemia Mother   . Supraventricular tachycardia Sister   . Heart attack Neg Hx   . Stroke Neg Hx   . Hypertension Neg Hx    Social History   Socioeconomic History  . Marital status: Married    Spouse name: Not on file  . Number of children: Not on file  . Years of education: Not on file  . Highest education level: Not on file  Occupational History  . Occupation: Retired  Tobacco Use  . Smoking status: Never  . Smokeless tobacco: Never  Substance and Sexual Activity  . Alcohol use: Yes    Alcohol/week: 1.0 - 2.0 standard drink    Types: 1 - 2 Standard drinks or equivalent per week    Comment: OCC  . Drug use: No  . Sexual activity: Not on file  Other Topics Concern  . Not on file  Social History Narrative   Lives in [place] with wife.  Active.   Social Determinants of Health   Financial Resource Strain: Not on file  Food Insecurity: Not on file  Transportation Needs: Not on file  Physical Activity: Not on file  Stress: Not on file  Social Connections: Not on file   No Known  Allergies  Medications  (Not in a hospital admission)    Vitals   Vitals:   11/24/20 1243 11/24/20 1621 11/24/20 1852 11/24/20 1945  BP: 120/87 (!) 149/117 (!) 188/114 (!) 147/99  Pulse: 65 69 81 60  Resp: 17 15 (!) 25 14  Temp: 97.7 F (36.5 C) 98.5 F (36.9 C)    TempSrc: Oral Oral    SpO2: 97% 99% 99% 98%     There is no height or weight on file to calculate BMI.  Physical Exam   General: Laying comfortably in bed; in no acute distress.  HENT: Normal oropharynx and mucosa. Normal external appearance of ears and nose.  Neck: Supple, no pain or tenderness  CV: No JVD. No peripheral edema.  Pulmonary: Symmetric Chest rise. Normal respiratory effort.  Abdomen: Soft to touch, non-tender.  Ext: No cyanosis, edema, or deformity  Skin: No rash. Normal palpation of skin.   Musculoskeletal: Normal digits and nails by inspection. No clubbing.   Neurologic Examination  Mental status/Cognition: Alert, oriented to self, place, month and year, good attention.  Speech/language: Fluent, comprehension intact, object naming intact, repetition intact.  Cranial nerves:   CN II Pupils equal and reactive to light, no VF deficits    CN III,IV,VI EOM intact, no gaze preference or deviation, no nystagmus    CN V normal sensation in V1, V2, and V3 segments bilaterally    CN VII no asymmetry, no nasolabial fold flattening    CN VIII normal hearing to speech    CN IX & X normal palatal elevation, no uvular deviation    CN XI 5/5 head turn and 5/5 shoulder shrug bilaterally    CN XII midline tongue protrusion    Motor:  Muscle bulk: normal, tone normal, pronator drift none tremor none Mvmt Root Nerve  Muscle Right Left Comments  SA C5/6 Ax Deltoid 4 5  EF C5/6 Mc Biceps 4+ 5   EE C6/7/8 Rad Triceps 4+ 5   WF C6/7 Med FCR     WE C7/8 PIN ECU     F Ab C8/T1 U ADM/FDI 4+ 5   HF L1/2/3 Fem Illopsoas 4 5   KE L2/3/4 Fem Quad 5 5   DF L4/5 D Peron Tib Ant 5 5   PF S1/2 Tibial Grc/Sol 5 5     Reflexes:  Right Left Comments  Pectoralis      Biceps (C5/6) 2 2   Brachioradialis (C5/6) 2 2    Triceps (C6/7) 2 2    Patellar (L3/4) 2 2    Achilles (S1) 0 0    Hoffman      Plantar     Jaw jerk    Sensation:  Light touch intact   Pin prick    Temperature    Vibration   Proprioception    Coordination/Complex Motor:  - Finger to Nose intact BL - Heel to shin intact BL - Rapid alternating movement are normal - Gait: Unsafe to assess given weakness.  Labs   CBC:  Recent Labs  Lab 11/24/20 0950 11/24/20 1025  WBC 4.4  --   NEUTROABS 2.7  --   HGB 15.4 16.0  HCT 45.8 47.0  MCV 92.7  --   PLT 155  --     Basic Metabolic Panel:  Lab Results  Component Value Date   NA 140 11/24/2020   K 4.1 11/24/2020   CO2 25 11/24/2020   GLUCOSE 124 (H) 11/24/2020   BUN 17 11/24/2020   CREATININE 0.80 11/24/2020   CALCIUM 9.5 11/24/2020   GFRNONAA >60 11/24/2020   GFRAA 77 11/25/2019   Lipid Panel:  Lab Results  Component Value Date   LDLCALC 53 04/27/2019   HgbA1c:  Lab Results  Component Value Date   HGBA1C 5.4 11/20/2018   Urine Drug Screen:     Component Value Date/Time   LABOPIA NONE DETECTED 11/24/2020 0950   COCAINSCRNUR NONE DETECTED 11/24/2020 0950   LABBENZ NONE DETECTED 11/24/2020 0950   AMPHETMU NONE DETECTED 11/24/2020 0950   THCU NONE DETECTED 11/24/2020 0950   LABBARB NONE DETECTED 11/24/2020 0950    Alcohol Level     Component Value Date/Time   ETH <10 11/24/2020 0950    CT Head without contrast(personally reviewed): CTH was negative for a large hypodensity concerning for a large territory infarct or hyperdensity concerning for an ICH  MR Angio head without contrast and Carotid Duplex BL: pending  MRI Brain: pending Impression   Eric [name] is a 87 y.o. 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 His neurologic  examination is notable for mild RUE and RLE weakness.  Likely TIA or a minor ischemic stroke. This may just be a small vessel stroke.  Primary Diagnosis:  Cerebral infarction, unspecified.  Secondary Diagnosis: Paroxysmal atrial fibrillation  Recommendations  Plan:  Recommend that primary team order following: - Frequent Neuro checks per stroke unit protocol - Recommend brain imaging with MRI Brain without contrast - Recommend Vascular imaging with MRA Angio Head without contrast and US Carotid doppler - Recommend obtaining TTE - Recommend obtaining Lipid panel with LDL - Please start statin if LDL > 70 - Recommend HbA1c - Continue home Eliquis - Recommend DVT ppx - SBP goal - permissive hypertension first 24 h <  220/110. Held home meds.  - Recommend Telemetry monitoring for arrythmia - Recommend bedside swallow screen prior to PO intake. - Stroke education booklet - Recommend PT/OT/SLP consult ______________________________________________________________________   Thank you for the opportunity to take part in the care of this patient. If you have any further questions, please contact the neurology consultation attending.  Signed,  [place] Pager Number [phone] _ _ _   _ __   _ __ _ _  __ __   _ __   __ _

## 2020-11-24 NOTE — ED Notes (Signed)
Patient transported to MRI 

## 2020-11-24 NOTE — H&P (Signed)
History and Physical  Eric [name] CBJ:[id number] DOB: 1929-08-18 DOA: 11/24/2020  Referring physician: Dr. Sabra Heck, [place]. PCP: [name], MD  Outpatient Specialists: Cardiology Patient coming from: Home.   Chief Complaint: Right-sided weakness.  HPI: Eric [name] is a 87 y.o. male with medical history significant for DVT/PE, paroxysmal atrial fibrillation on Eliquis, hyperlipidemia, aortic valve replacement with bioprosthetic cardiac valve, mitral ring annuloplasty, TAVR, coronary artery disease, who presented to [facility] ED due to right-sided weakness.  Onset on 11/23/2020 around 1930.  Patient was having dinner with company and was having difficulty holding and using his fork from his right hand.  Later that evening he noted that he was also having RLE weakness with difficulty ambulating.  He went to bed hoping that these symptoms would resolve.  His right hand weakness improved however his right lower extremity weakness persisted.  His wife called his PCP this morning who recommended that he come to the ED for further evaluation.  In the ED he was noted to have elevated BP, CT head was unrevealing but his RLE is not back to his baseline.  Neurology was consulted and recommended hospitalist admission for stroke work-up.  MRI brain, MRA head without contrast, MRA neck with and without contrast are pending.  Ongoing stroke work-up.  ED Course:  Temperature 98.5.  BP 164/99, pulse 82, respiration rate 21, O2 saturation 95% on room air.  Lab studies remarkable for T bilirubin 3.9.  Rest of chemistry panel unremarkable.  CBC essentially unremarkable.  Review of Systems: Review of systems as noted in the HPI. All other systems reviewed and are negative.   Past Medical History:  Diagnosis Date   Atrial flutter, paroxysmal ([place])    a. dx 04-02-2014, s/p  successful cardioversion 04-06-2014.   Chronic anticoagulation    on Eliquis--  due to recurrent dvt's and pe's   Dyslipidemia    History of  bladder cancer urologist-  dr [name]   02-13-2016  s/p TURBT per path high grade papillary urothelial carcinoma   History of DVT (deep vein thrombosis)    2000-- RLE   History of melanoma excision    left flank;  07/ 2014 left lower leg and right upper chest   History of prostate cancer    Gleason 6--  s/p  radical prostatectomy 06/ 2000 in [place], [place]---  no recurrence   History of pulmonary embolus (PE)    2000 & 2005  bilateral   Hx of valvuloplasty 06/19/2007   a. 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 05/2007 CABG x 1: s/p SVG-OM;  b. 10/2010 Ex MV: EF 72%, inf attenuation w/o ischemia, brief run of PAT with exercise. To   Thrombocytopenia ([place])    Thyroid goiter 2013   nodular   Wears hearing aid    BILATERAL   Wears partial dentures    UPPER   Past Surgical History:  Procedure Laterality Date   AORTIC VALVE REPLACEMENT (AVR)/CORONARY ARTERY BYPASS GRAFTING (CABG)  06-19-2007  dr [name]   SVG to OM1 ;   AVR w/ #23 Mitralflow pericardial and ligation left atrial appendage;  MV repair w/ #28 Sorin 3-D memo Ring Annuloplasty with repair ruptured chordar of P-2 flail segments   CARDIAC CATHETERIZATION  05-12-2007  dr Leonia Reeves   single vessel 30% ostial LAD,  80% LCx;  severe to critial AS,  moderate MR,  normal LVF, ef 70%,  normal right heart pressures and cardiac outputs,  mild elevated LV end-diastolic pressure     CARDIOVASCULAR STRESS TEST  11-02-2010  dr Irish Lack   normal nuclear study w/ no ischemia or infarct/scar/  normal LV function and wall motion , ef 72%   CARDIOVERSION N/A 04/06/2014   Procedure: CARDIOVERSION;  Surgeon: [name], MD;  Location: [place];  Service: Cardiovascular;  Laterality: N/A;   successful    CATARACT EXTRACTION W/ INTRAOCULAR LENS  IMPLANT, BILATERAL  2012  approx   CORONARY ARTERY BYPASS GRAFT     RETROPUBIC RADICAL PROSTATECTOMY  06/ 2000  in [place], [place]   RIGHT/LEFT HEART CATH AND CORONARY/GRAFT ANGIOGRAPHY N/A 10/29/2018   Procedure: RIGHT/LEFT HEART CATH AND CORONARY/GRAFT ANGIOGRAPHY;  Surgeon: [name], MD;  Location: [place] [facility];  Service: Cardiovascular;  Laterality: N/A;   ROTATOR CUFF REPAIR Left 09/1998   TEE WITHOUT CARDIOVERSION N/A 04/06/2014   Procedure: TRANSESOPHAGEAL ECHOCARDIOGRAM (TEE);  Surgeon: [name], MD;  Location: [facility] ENDOSCOPY;  Service: Cardiovascular;  Laterality: N/A;  mild focal basa LVH of the septum, ef 50-55%, s/p AV annuloplasty ring, elongated chordae, thickened leaflets, mild to mod. MR, multiple small jets/arotic bioprosthetic valve sits well in position, mild AI/ thickened TV w/ mild reurg./mild LA   TEE WITHOUT CARDIOVERSION N/A 10/22/2018   Procedure: TRANSESOPHAGEAL ECHOCARDIOGRAM (TEE);  Surgeon: [name], MD;  Location: [facility] ENDOSCOPY;  Service: Cardiovascular;  Laterality: N/A;   TEE WITHOUT CARDIOVERSION N/A 11/25/2018   Procedure: TRANSESOPHAGEAL ECHOCARDIOGRAM (TEE);  Surgeon: [name], MD;  Location: [place];  Service: Open Heart Surgery;  Laterality: N/A;   TRANSCATHETER AORTIC VALVE REPLACEMENT, TRANSFEMORAL N/A 11/25/2018   Procedure: TRANSCATHETER AORTIC VALVE REPLACEMENT, TRANSFEMORAL;  Surgeon: [name], MD;  Location: [place];  Service: Open Heart Surgery;  Laterality: N/A;   TRANSTHORACIC ECHOCARDIOGRAM  03-23-2016   dr Irish Lack   EF 55-60%, reduced contribution atrial contraction to ventricular filling, due to increased ventricular diastolic pressure or atrial contratile dysfunction/  bioprosthetic AV w/ mod. regurg. (valve area 1.97cm^2)/ mild dilated ascending aorta/ mild thicken MV normal function annular ring prosthesis/severe LAE & mod. to sev.RAE/ PASP 69mmHg/ mild TR/ ventricle septal motion  show paradox   TRANSURETHRAL RESECTION OF BLADDER TUMOR N/A 02/13/2016   Procedure: TRANSURETHRAL RESECTION OF BLADDER TUMOR (TURBT);  Surgeon: Nickie Retort, MD;  Location: [facility];  Service: Urology;  Laterality: N/A;   TRANSURETHRAL RESECTION OF BLADDER TUMOR N/A 09/04/2016   Procedure: TRANSURETHRAL RESECTION OF BLADDER TUMOR (TURBT);  Surgeon: Nickie Retort, MD;  Location: [facility];  Service: Urology;  Laterality: N/A;    Social History:  reports that he has never smoked. He has never used smokeless tobacco. He reports current alcohol use of about 1.0 - 2.0 standard drink of alcohol per week. He reports that he does not use drugs.   No Known Allergies  Family History  Problem Relation Age of Onset   Leukemia Mother    Supraventricular tachycardia Sister    Heart attack Neg Hx    Stroke Neg Hx    Hypertension Neg Hx      Prior to Admission medications   Medication Sig Start Date End Date Taking? Authorizing Provider  amoxicillin (AMOXIL) 500 MG tablet Take 4 tablets (2,000 mg total) by mouth as directed.  Take 1 hour prior to dental work, including cleanings. 04/27/19   Jettie Booze, MD  atorvastatin (LIPITOR) 40 MG tablet TAKE 1 TABLET BY MOUTH IN  THE EVENING 03/31/20   Jettie Booze, MD  ELIQUIS 5 MG TABS tablet TAKE 1 TABLET BY MOUTH  TWICE DAILY 08/19/20   Jettie Booze, MD  ezetimibe (ZETIA) 10 MG tablet TAKE 1 TABLET BY MOUTH IN  THE EVENING 06/02/20   Jettie Booze, MD  furosemide (LASIX) 20 MG tablet TAKE 1 TABLET BY MOUTH  EVERY OTHER DAY, MAY TAKE 1 ADDITIONAL DOSE IF NEEDED  FOR LEG SWELLING 04/04/20   Jettie Booze, MD  losartan (COZAAR) 25 MG tablet Take 1 tablet daily AS NEEDED. Take 1 tablet if Systolic BP >127. Hold if Systolic BP <517. 00/17/49   Jettie Booze, MD    Physical Exam: BP (!) 147/99   Pulse 60   Temp 98.5 F (36.9 C) (Oral)   Resp 14   SpO2 98%   General: 87  y.o. year-old male well developed well nourished in no acute distress.  Alert and oriented x3. Cardiovascular: Irregular rate and rhythm with no rubs or gallops.  No thyromegaly or JVD noted.  No lower extremity edema. 2/4 pulses in all 4 extremities. Respiratory: Clear to auscultation with no wheezes or rales. Good inspiratory effort. Abdomen: Soft nontender nondistended with normal bowel sounds x4 quadrants. Muskuloskeletal: No cyanosis, clubbing or edema noted bilaterally Neuro: CN II-XII intact, strength, sensation, reflexes Skin: No ulcerative lesions noted or rashes Psychiatry: Judgement and insight appear normal. Mood is appropriate for condition and setting          Labs on Admission:  Basic Metabolic Panel: Recent Labs  Lab 11/24/20 0950 11/24/20 1025  NA 138 140  K 4.1 4.1  CL 106 105  CO2 25  --   GLUCOSE 123* 124*  BUN 15 17  CREATININE 0.93 0.80  CALCIUM 9.5  --    Liver Function Tests: Recent Labs  Lab 11/24/20 0950  AST 27  ALT 20  ALKPHOS 52  BILITOT 3.9*  PROT 7.0  ALBUMIN 4.0   No results for input(s): LIPASE, AMYLASE in the last 168 hours. No results for input(s): AMMONIA in the last 168 hours. CBC: Recent Labs  Lab 11/24/20 0950 11/24/20 1025  WBC 4.4  --   NEUTROABS 2.7  --   HGB 15.4 16.0  HCT 45.8 47.0  MCV 92.7  --   PLT 155  --    Cardiac Enzymes: No results for input(s): CKTOTAL, CKMB, CKMBINDEX, TROPONINI in the last 168 hours.  BNP (last 3 results) No results for input(s): BNP in the last 8760 hours.  ProBNP (last 3 results) No results for input(s): PROBNP in the last 8760 hours.  CBG: No results for input(s): GLUCAP in the last 168 hours.  Radiological Exams on Admission: CT HEAD WO CONTRAST  Result Date: 11/24/2020 CLINICAL DATA:  Transient ischemic attack EXAM: CT HEAD WITHOUT CONTRAST TECHNIQUE: Contiguous axial images were obtained from the base of the skull through the vertex without intravenous contrast. COMPARISON:   None. FINDINGS: Brain: No acute intracranial hemorrhage. No focal mass lesion. No CT evidence of acute infarction. No midline shift or mass effect. No hydrocephalus. Basilar cisterns are patent. There are periventricular and subcortical white matter hypodensities. Generalized cortical atrophy. Vascular: No hyperdense vessel or unexpected calcification. Skull: Normal. Negative for fracture or focal lesion. Sinuses/Orbits: Paranasal sinuses and mastoid air cells are clear. Orbits  are clear. Other: None. IMPRESSION: No acute intracranial findings. Moderate atrophy and white matter microvascular disease. Electronically Signed   By: Suzy Bouchard M.D.   On: 11/24/2020 10:59    EKG: I independently viewed the EKG done and my findings are as followed: A. fib with a rate of 84.  Nonspecific ST-T changes.  QTc 420.  Assessment/Plan Present on Admission: **None**  Active Problems:   Acute right-sided weakness  Acute right-sided weakness involving right hand and right lower extremity, rule out acute CVA. Onset of symptoms on 11/23/2020 around 1930 Patient is on Eliquis for history of DVT/PE and A. fib. Not a candidate for tPA CT head unremarkable for any acute intracranial findings. MRI brain, MRA head without contrast, MRA neck with and without contrast are pending. 2D echo with bubble study Fasting lipid panel, A1c PT/OT/speech therapy Permissive hypertension for now until seen by neurology, treat SBP greater than 220 or DBP greater than 120. Neurochecks every 4 hours Aspiration/fall precautions. Lipitor 80 mg daily, patient was on 40 mg daily previously.  Paroxysmal A. fib on Eliquis Hold off home beta-blocker for now due to permissive hypertension On Eliquis for primary CVA prevention, unclear if he had an acute CVA, ongoing work-up.   Last dose of Eliquis was this morning around 8:30 AM on 11/24/2020. Closely monitor on telemetry.  Isolated hyperbilirubinemia To bilirubin 3.9, alkaline  phosphatase, AST ALT normal Nonspecific Repeat CMP in the morning  Hyperlipidemia/history of greater than 1 heart valve replacement/history of DVT/PE Resume home regimen   DVT prophylaxis: Eliquis  Code Status: Full code  Family Communication: Wife at bedside  Disposition Plan: Admit to telemetry medical.  Consults called: Neurology consulted by EDP.  Admission status: Observation status.   Status is: Observation    Dispo:  Patient From: Home  Planned Disposition: Home possibly on 11/25/2020 or when neurology signs off.  Medically stable for discharge: No      Kayleen Memos MD [facility] Pager [phone]  If 7PM-7AM, please contact night-coverage [email or web address] Password [facility]  11/24/2020, 8:56 PM

## 2020-11-24 NOTE — ED Provider Notes (Signed)
Emergency Medicine Provider Triage Evaluation Note  Eric [name] , a 87 y.o. male  was evaluated in triage.  Pt complains of right-sided weakness that started at 8 PM yesterday.  Patient reports his right hand felt weak when eating dinner.  He started having lightheadedness and dizziness and had to sit down.  Went to bed, woke up twice in the night to urinate.  Had dribbling which is not normal for him.  Difficulty walking because he felt lightheaded and like his right leg was going to give out on him.  At this morning he was unable to ambulate without assistance.  Called his doctor who told him to come to the ED to be worked up for possible TIA.    He is on Eliquis, missed 1 dose earlier in the week but took it in the morning.  History of open heart surgery.  Review of Systems  Positive: Right-sided weakness, dizziness, gait issue Negative: Chest pain, shortness of breath  Physical Exam  There were no vitals taken for this visit. Gen:   Awake, no distress   Resp:  Normal effort  MSK:   Moves extremities without difficulty. Difficulty ambulating due to dizziness - in wheel chair Other:  Cranial nerves III through XII are grossly intact.  Patient is communicating without dysarthria.  Oriented x3.  Grip strength is equal bilaterally.  Lower extremity strength is equal bilaterally.  Strength of extremities 5 out of 5 x 4.  Sensation to light touch intact.  Medical Decision Making  Medically screening exam initiated at 9:42 AM.  Appropriate orders placed.  Eric [name] was informed that the remainder of the evaluation will be completed by another provider, this initial triage assessment does not replace that evaluation, and the importance of remaining in the ED until their evaluation is complete.  Patient is outside of window to call code stroke.  Will initiate work-up.   Eric Raring, PA-C 11/24/20 [id number]    Eric Bo, MD 11/26/20 1336

## 2020-11-24 NOTE — ED Triage Notes (Signed)
Patient here for evaluation of an episode of extremity weakness and loss of balance last night at approximately 2000 last night that resolved. Patient alert, oriented, and in no apparent distress at this time. Reports balance problem started approximately two years ago.

## 2020-11-25 ENCOUNTER — Observation Stay (HOSPITAL_BASED_OUTPATIENT_CLINIC_OR_DEPARTMENT_OTHER): Payer: Medicare Other

## 2020-11-25 ENCOUNTER — Encounter (HOSPITAL_COMMUNITY): Payer: Self-pay | Admitting: Internal Medicine

## 2020-11-25 DIAGNOSIS — Z86711 Personal history of pulmonary embolism: Secondary | ICD-10-CM

## 2020-11-25 DIAGNOSIS — I2510 Atherosclerotic heart disease of native coronary artery without angina pectoris: Secondary | ICD-10-CM | POA: Diagnosis not present

## 2020-11-25 DIAGNOSIS — I6330 Cerebral infarction due to thrombosis of unspecified cerebral artery: Secondary | ICD-10-CM | POA: Insufficient documentation

## 2020-11-25 DIAGNOSIS — I4811 Longstanding persistent atrial fibrillation: Secondary | ICD-10-CM

## 2020-11-25 DIAGNOSIS — E785 Hyperlipidemia, unspecified: Secondary | ICD-10-CM

## 2020-11-25 DIAGNOSIS — I6389 Other cerebral infarction: Secondary | ICD-10-CM | POA: Diagnosis not present

## 2020-11-25 DIAGNOSIS — R531 Weakness: Secondary | ICD-10-CM | POA: Diagnosis not present

## 2020-11-25 DIAGNOSIS — Z952 Presence of prosthetic heart valve: Secondary | ICD-10-CM

## 2020-11-25 DIAGNOSIS — I639 Cerebral infarction, unspecified: Secondary | ICD-10-CM | POA: Diagnosis not present

## 2020-11-25 DIAGNOSIS — Z9889 Other specified postprocedural states: Secondary | ICD-10-CM

## 2020-11-25 LAB — LIPID PANEL
Cholesterol: 127 mg/dL (ref 0–200)
HDL: 50 mg/dL (ref >40)
LDL Cholesterol: 66 mg/dL (ref 0–99)
Total CHOL/HDL Ratio: 2.5 RATIO
Triglycerides: 57 mg/dL (ref <150)
VLDL: 11 mg/dL (ref 0–40)

## 2020-11-25 LAB — BASIC METABOLIC PANEL
Anion gap: 7 (ref 5–15)
BUN: 13 mg/dL (ref 8–23)
CO2: 23 mmol/L (ref 22–32)
Calcium: 9.3 mg/dL (ref 8.9–10.3)
Chloride: 106 mmol/L (ref 98–111)
Creatinine, Ser: 0.85 mg/dL (ref 0.61–1.24)
GFR, Estimated: >60 mL/min (ref >60)
Glucose, Bld: 95 mg/dL (ref 70–99)
Potassium: 3.7 mmol/L (ref 3.5–5.1)
Sodium: 136 mmol/L (ref 135–145)

## 2020-11-25 LAB — CBC
HCT: 43.7 % (ref 39.0–52.0)
Hemoglobin: 15.1 g/dL (ref 13.0–17.0)
MCH: 31.8 pg (ref 26.0–34.0)
MCHC: 34.6 g/dL (ref 30.0–36.0)
MCV: 92 fL (ref 80.0–100.0)
Platelets: 138 10*3/uL — ABNORMAL LOW (ref 150–400)
RBC: 4.75 MIL/uL (ref 4.22–5.81)
RDW: 12 % (ref 11.5–15.5)
WBC: 5.2 10*3/uL (ref 4.0–10.5)
nRBC: 0 % (ref 0.0–0.2)

## 2020-11-25 LAB — ECHOCARDIOGRAM COMPLETE BUBBLE STUDY
AR max vel: 2.17 cm2
AV Area VTI: 2.1 cm2
AV Area mean vel: 2.18 cm2
AV Mean grad: 5.5 mmHg
AV Peak grad: 10.2 mmHg
Ao pk vel: 1.6 m/s
Area-P 1/2: 2.96 cm2
MV VTI: 1.18 cm2
S' Lateral: 3.3 cm

## 2020-11-25 LAB — PHOSPHORUS: Phosphorus: 3.2 mg/dL (ref 2.5–4.6)

## 2020-11-25 LAB — MAGNESIUM: Magnesium: 1.9 mg/dL (ref 1.7–2.4)

## 2020-11-25 LAB — HEMOGLOBIN A1C
Hgb A1c MFr Bld: 5.8 % — ABNORMAL HIGH (ref 4.8–5.6)
Mean Plasma Glucose: 119.76 mg/dL

## 2020-11-25 MED ORDER — ATORVASTATIN CALCIUM 40 MG PO TABS: 40.0000 mg | ORAL_TABLET | Freq: Every day | ORAL | Status: DC

## 2020-11-25 NOTE — Evaluation (Signed)
Physical Therapy Evaluation Patient Details Name: Eric [name] MRN: [id number] DOB: April 23, 1930 Today's Date: 11/25/2020   History of Present Illness  This 87 y.o. male presented to ED due to difficulty using his fork in his Rt hand, and Rt LE weakness on 11/24/20.  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  PMH includes:  A-Flutter, h/o bladder CA, h/o prostate CA, DVT, s/p aortic valve replacement, s/p TAVR. s/p RCR  Clinical Impression  Pt admitted with above diagnosis. At baseline, pt is normally independent without AD.  He does report hx of a few falls while outside, and also reports periods of shuffle gait or weakness that improved with outpt PT and HEP.  He is very motivated and has good support.  Demonstrates mild deficits in R hip strength and decreased coordination on R LE leading to decreased balance and mobility.  His gait is steady with a RW.  Pt has excellent rehab potential.  Pt currently with functional limitations due to the deficits listed below (see PT Problem List). Pt will benefit from skilled PT to increase their independence and safety with mobility to allow discharge to the venue listed below.       Follow Up Recommendations Outpatient PT    Equipment Recommendations  None recommended by PT    Recommendations for Other Services       Precautions / Restrictions Precautions Precautions: Fall Precaution Comments: history of falls and instability Restrictions Weight Bearing Restrictions: No      Mobility  Bed Mobility Overal bed mobility: Modified Independent             General bed mobility comments: ED stretcher    Transfers Overall transfer level: Needs  assistance Equipment used: Rolling walker (2 wheeled) Transfers: Sit to/from Stand Sit to Stand: Supervision         General transfer comment: Performed x 3  Ambulation/Gait Ambulation/Gait assistance: Supervision;Min guard Gait Distance (Feet): 400 Feet Assistive device: Rolling walker (2 wheeled) Gait Pattern/deviations: Step-through pattern;Decreased step length - right Gait velocity: normal with RW   General Gait Details: Began with RW and ambulate 400' - overall pt steady with RW, at times with shuffle pattern that improved with cues for heel strike.  Tried 10' ambulation in room without AD and pt with very shuffled pattern with step to L pattern and difficulty advancing R LE.  Addtionally, pt has been ambulating to bathroom using RW and wife's supervision  Stairs            Wheelchair Mobility    Modified Rankin (Stroke Patients Only) Modified Rankin (Stroke Patients Only) Pre-Morbid Rankin Score: No significant disability Modified Rankin: Moderate disability     Balance Overall balance assessment: Needs assistance Sitting-balance support: Feet supported Sitting balance-Leahy Scale: Good     Standing balance support: Bilateral upper extremity supported;No upper extremity supported Standing balance-Leahy Scale: Fair Standing balance comment: Static standing and min challenges without UE support; RW to ambulate               High Level Balance Comments: Pt was able to static stand , pick up glove box from floor, and reach forward 5 " without LOB without UE support.  He was able to stand feet apart EO with  good stability but had significant sway with EC             Pertinent Vitals/Pain Pain Assessment: No/denies pain    Home Living Family/patient expects to be discharged to:: Private residence Living Arrangements: Spouse/significant other Available Help at Discharge: Family;Available 24 hours/day Type of Home: House (town house) Home Access:  Stairs to enter Entrance Stairs-Rails: Horticulturist, commercial of Steps: 4 Home Layout: Two level;Able to live on main level with bedroom/bathroom;Full bath on main level Home Equipment: Wheelchair - [employer] - 2 wheels;Cane - single point;Bedside commode;Shower seat - built in;Shower seat Additional Comments: wife supportive    Prior Function Level of Independence: Independent         Comments: 1-2 falls in the past 3 months while outside.  Wife reports impaired balance at baseline and reports he is wobbly.  Pt drives.  Wife manages finances and medications. He has done outpt PT twice in past that improved balance but then reports he slacks off on HEP and declines     Hand Dominance   Dominant Hand: Right    Extremity/Trunk Assessment   Upper Extremity Assessment Upper Extremity Assessment: Defer to OT evaluation    Lower Extremity Assessment Lower Extremity Assessment: LLE deficits/detail;RLE deficits/detail RLE Deficits / Details: ROM WFL; MMT: ankle and knee ext 5/5, hip 4/5 RLE Sensation: WNL RLE Coordination: decreased gross motor;decreased fine motor (difficulty with toe tapping and heel to shin) LLE Deficits / Details: ROM WFL; MMT 5/5 LLE Sensation: WNL LLE Coordination: WNL    Cervical / Trunk Assessment Cervical / Trunk Assessment: Normal  Communication   Communication: HOH  Cognition Arousal/Alertness: Awake/alert Behavior During Therapy: WFL for tasks assessed/performed Overall Cognitive Status: Within Functional Limits for tasks assessed                                 General Comments: See OT for formal assessment      General Comments General comments (skin integrity, edema, etc.): Educated pt and wife on recommendations for outpt PT, using walker 100% of the time until cleared by outpt PT, and having supervision for mobility initially.  They have steps to enter home - educated on sequencing "up with good and down with bad" use  of rail and their niece and nephew  are staying with them for a week and can assist. Also, discussed could begin HEP including sit to stands and AROM on R LE sitting or supine.  Did not advice standing balance until begins outpt PT.    Exercises     Assessment/Plan    PT Assessment Patient needs continued PT services  PT Problem List Decreased mobility;Decreased strength;Decreased coordination;Decreased balance;Decreased knowledge of use of DME       PT Treatment Interventions DME instruction;Therapeutic activities;Gait training;Therapeutic exercise;Patient/family education;Stair training;Balance training;Functional mobility training;Neuromuscular re-education    PT Goals (Current goals can be found in the Care Plan section)  Acute Rehab PT Goals Patient Stated Goal: to improve my balance PT Goal Formulation: With patient/family Time For Goal Achievement: 12/09/20 Potential to Achieve Goals: Good Additional Goals Additional Goal #1: Will score >19/24 on DGI to indicate lower fall risk    Frequency Min 4X/week   Barriers to discharge        Co-evaluation               AM-PAC PT "6 Clicks" Mobility  Outcome Measure Help needed turning from your back to  your side while in a flat bed without using bedrails?: None Help needed moving from lying on your back to sitting on the side of a flat bed without using bedrails?: None Help needed moving to and from a bed to a chair (including a wheelchair)?: A Little Help needed standing up from a chair using your arms (e.g., wheelchair or bedside chair)?: A Little Help needed to walk in hospital room?: A Little Help needed climbing 3-5 steps with a railing? : A Little 6 Click Score: 20    End of Session Equipment Utilized During Treatment: Gait belt Activity Tolerance: Patient tolerated treatment well Patient left: Other (comment) (in bathroom with wife assisting) Nurse Communication: Mobility status PT Visit Diagnosis:  Unsteadiness on feet (R26.81);Hemiplegia and hemiparesis Hemiplegia - Right/Left: Right Hemiplegia - dominant/non-dominant: Dominant Hemiplegia - caused by: Cerebral infarction    Time: 0964-3838 PT Time Calculation (min) (ACUTE ONLY): 34 min   Charges:   PT Evaluation $PT Eval Moderate Complexity: 1 Mod PT Treatments $Gait Training: 8-22 mins        Abran Richard, PT Acute Rehab Services Pager [phone] [facility] [place] 11/25/2020, 2:49 PM

## 2020-11-25 NOTE — ED Notes (Signed)
Nurse report given to Tye Maryland, RN at this time. He is currently up walking with PT in the hallway. I will have him transported after he is done with PT.

## 2020-11-25 NOTE — Evaluation (Signed)
Occupational Therapy Evaluation Patient Details Name: Eric [name] MRN: [id number] DOB: [birth year] Today's Date: 11/25/2020    History of Present Illness This 87 y.o. male presented to ED due to difficulty using his fork in his Rt hand, and Rt LE weakness.  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  PMH includes:  A-Flutter, h/o bladder CA, h/o prostate CA, DVT, s/p aortic valve replacement, s/p TAVR. s/p RCR   Clinical Impression   Patient evaluated by Occupational Therapy with no further acute OT needs identified. All education has been completed and the patient has no further questions. Pt with mild Rt UE weakness and incoordination - recommend OPOT at discharge.  See below for any follow-up Occupational Therapy or equipment needs. OT is signing off. Thank you for this referral.     Follow Up Recommendations  Outpatient OT;Supervision - Intermittent    Equipment Recommendations  None recommended by OT    Recommendations for Other Services       Precautions / Restrictions Precautions Precautions: Fall Precaution Comments: history of falls and instability      Mobility Bed Mobility Overal bed mobility: Modified Independent             General bed mobility comments: ED stretcher    Transfers                      Balance Overall balance assessment: Needs assistance Sitting-balance support: Feet supported Sitting balance-Leahy Scale: Good     Standing balance support: No upper extremity supported Standing balance-Leahy Scale: Fair Standing balance comment: static standing without UE support and min gaurd                           ADL either  performed or assessed with clinical judgement   ADL Overall ADL's : Needs assistance/impaired Eating/Feeding: Independent   Grooming: Wash/dry hands;Oral care;Wash/dry face;Brushing hair;Min guard;Standing   Upper Body Bathing: Set up;Sitting   Lower Body Bathing: Min guard;Sit to/from stand   Upper Body Dressing : Min guard;Sitting   Lower Body Dressing: Min guard;Sit to/from stand   Toilet Transfer: Min guard;Ambulation;Comfort height toilet;BSC;RW   Toileting- Water quality scientist and Hygiene: Min guard;Sit to/from Nurse, children's Details (indicate cue type and reason): recommend pt sit to shower - he and wife verbalize agreement Functional mobility during ADLs: Min guard;Rolling walker       Vision Baseline Vision/History: No visual deficits Patient Visual Report: No change from baseline Vision Assessment?: Yes Eye Alignment: Within Functional Limits Alignment/Gaze Preference: Within Defined Limits Tracking/Visual Pursuits: Able to track stimulus in all quads without difficulty Visual Fields: No apparent deficits     Agricultural engineer Tested?: Yes   Praxis Praxis Praxis tested?: Within functional limits    Pertinent Vitals/Pain Pain Assessment: No/denies pain     Hand Dominance Right   Extremity/Trunk Assessment Upper Extremity Assessment Upper Extremity Assessment: RUE deficits/detail;LUE deficits/detail RUE Deficits / Details: grossly 4/5-4+/5 mild tremor noted RUE Coordination: decreased gross motor;decreased fine motor LUE Deficits / Details: mild tremor noted   Lower Extremity Assessment Lower Extremity Assessment: Defer to PT evaluation   Cervical / Trunk Assessment Cervical / Trunk Assessment: Normal  Communication Communication Communication: HOH (bil. hearing aids)   Cognition Arousal/Alertness: Awake/alert Behavior During Therapy: WFL for tasks assessed/performed Overall Cognitive Status: Within Functional  Limits for tasks assessed                                 General Comments:  (Pt scored 0/28 on the Short Blessed Test.  Cognition appears grossly intact)   General Comments  BP 164/100.  Pt agreeable to OPOT.  Instructed wife to walk with him when he is up due to balance deficits.  They both verbalized understanding    Exercises     Shoulder Instructions      Home Living Family/patient expects to be discharged to:: Private residence Living Arrangements: Spouse/significant other   Type of Home: Other(Comment) (townhouse) Home Access: Stairs to enter [employer] of Steps: 4 Entrance Stairs-Rails: Left Home Layout: Two level;Able to live on main level with bedroom/bathroom;Full bath on main level Alternate Level Stairs-Number of Steps: 4 front door Alternate Level Stairs-Rails: Left;Right;Can reach both Bathroom Shower/Tub: Occupational psychologist: Handicapped height     Home Equipment: Wheelchair - [employer] - 2 wheels;Cane - single point;Bedside commode;Shower seat - built in;Shower seat   Additional Comments: wife supportive      Prior Functioning/Environment Level of Independence: Independent        Comments: 1-2 falls in the past 3 months while outside.  Wife reports impaired balance at baseline and reports he is wobbly.  Pt drives.  Wife manages finances and medications        OT Problem List: Impaired balance (sitting and/or standing);Impaired UE functional use      OT Treatment/Interventions:      OT Goals(Current goals can be found in the care plan section) Acute Rehab OT Goals Patient Stated Goal: to improve my balance OT Goal Formulation: All assessment and education complete, DC therapy  OT Frequency:     Barriers to D/C:            Co-evaluation              AM-PAC OT "6 Clicks" Daily Activity     Outcome Measure Help from another person eating meals?: None Help from another person taking care of  personal grooming?: A Little Help from another person toileting, which includes using toliet, bedpan, or urinal?: A Little Help from another person bathing (including washing, rinsing, drying)?: A Little Help from another person to put on and taking off regular upper body clothing?: A Little Help from another person to put on and taking off regular lower body clothing?: A Little 6 Click Score: 19   End of Session Equipment Utilized During Treatment: Rolling walker Nurse Communication: Mobility status  Activity Tolerance: Patient tolerated treatment well Patient left: in bed;with call bell/phone within reach;with family/visitor present  OT Visit Diagnosis: Unsteadiness on feet (R26.81)                Time: 8088-1103 OT Time Calculation (min): 34 min Charges:  OT General Charges $OT Visit: 1 Visit OT Evaluation $OT Eval Moderate Complexity: 1 Mod OT Treatments $Therapeutic Activity: 8-22 mins  Nilsa Nutting., OTR/L Acute Rehabilitation Services Pager [phone] Office [phone]   Lucille Passy M 11/25/2020, 10:58 AM

## 2020-11-25 NOTE — TOC Transition Note (Signed)
Transition of Care [facility]) - CM/SW Discharge Note   Patient Details  Name: Eric [name] MRN: [id number] Date of Birth: Jul 18, 1929  [facility] CM/SW Contact:  Pollie Friar, RN Phone Number: 11/25/2020, 3:24 PM   Clinical Narrative:    Patient is discharging home with outpatient therapy through [facility]. Patient has information on the AVS in order to call [facility] on Monday to schedule an appointment. Pt has: walker, 3 in 1, canes at home with a seat also in the shower.  Wife denies issues with home medications or transportation. Wife to provide transport home.  Final next level of care: OP Rehab Barriers to Discharge: No Barriers Identified   Patient Goals and CMS Choice   CMS Medicare.gov Compare Post Acute Care list provided to:: Patient Represenative (must comment) Choice offered to / list presented to : Spouse  Discharge Placement                       Discharge Plan and Services                                     Social Determinants of Health (SDOH) Interventions     Readmission Risk Interventions No flowsheet data found.

## 2020-11-25 NOTE — ED Notes (Signed)
PT working with patient at this time. Update given on how well he walked to the restroom this morning.

## 2020-11-25 NOTE — ED Notes (Signed)
Echo bubble study being done right now.

## 2020-11-25 NOTE — Discharge Summary (Addendum)
Physician Discharge Summary  NAOL ONTIVEROS RCV:[id number] DOB: 06-Nov-1929 DOA: 11/24/2020  PCP: [name], MD  Admit date: 11/24/2020 Discharge date: 11/25/2020  Admitted From: Home Disposition: Home  Recommendations for Outpatient Follow-up:  Follow-up BP- Losartan has been on hold per cardiology since Jan, 2022- BPs have been high in the hospital  Home Health: None-patient PT and OT ordered Discharge Condition: Stable CODE STATUS: Full code Diet recommendation: Heart healthy Consultations: Neurology Procedures/Studies: See below   Discharge Diagnoses:  Principal Problem:   Cerebral thrombosis with cerebral infarction Active Problems:   Acute right-sided weakness   Coronary atherosclerosis of native coronary artery   Hyperlipidemia   History of aortic valve replacement   Atrial fibrillation ([place])   Prosthetic valve dysfunction   History of pulmonary embolus (PE)   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 male with medical history significant for DVT/PE, paroxysmal atrial fibrillation on Eliquis, hyperlipidemia, aortic valve replacement with bioprosthetic cardiac valve, mitral ring annuloplasty, TAVR, coronary artery disease, who presented to [facility] ED due to right-sided weakness.  Onset on 11/23/2020 around 1930.  Patient was having dinner with company and was having difficulty holding and using his fork from his right hand.  Later that evening he noted that he was also having RLE weakness with difficulty ambulating.  He went to bed hoping that these symptoms would resolve.  His right hand weakness improved however his right lower extremity weakness persisted.  His wife called his PCP this morning who recommended that he come to the ED for further evaluation.   Head CT in the ED without contrast showed moderate atrophy and white matter microvascular disease. BP was 164/99 Admitted for a stroke work-up.  Hospital  Course:  Acute CVA- likely due to small vessel disease -The patient still has 4 out of 5 weakness on the right arm and leg. - MRI reveals 1 cm acute ischemic infarct involving the deep white matter of the posterior left frontal centrum semiovale- Old lacunar infarct bilateral white matter, BG, right thalamic, right cerebellum. - MRI/MRA did not reveal any significant stenosis - The 2D echo does not reveal a thrombus-full report is below -LDL is 50 - Hemoglobin A1c is 5.8 - PT and OT are recommending outpatient PT and OT follow-up - Neurology is recommending to add 81 mg aspirin (already on Eliquis for A. fib) and continue home dose of Lipitor 40 mg daily and Zetia. - Neurology will arrange outpatient follow-up for 4 weeks from now  Essential hypertension - His blood pressures have been slightly elevated-see below wife states that he was taken off of his losartan in January by his cardiologist because his blood pressure was a little bit on the low side- at this time we are allowing for permissive hypertension but this should be followed closely as outpatient  A-fib - takes his Eliquis regularly - rate is in 70-80s today  Chronic diastolic heart failure, TAVR, mitral ring annuloplasty and coronary artery disease - Continue home medications and regular follow-ups with cardiology  Discharge Exam: Vitals:   11/25/20 1315 11/25/20 1419  BP: (!) 143/93 (!) 144/77  Pulse: (!) 105 72  Resp: 16 16  Temp: 98.1 F (36.7 C) 98.1 F (36.7 C)  SpO2: 96% 99%   Vitals:   11/25/20 1230 11/25/20 1300 11/25/20 1315 11/25/20 1419  BP: 124/70 (!) 144/90 (!) 143/93 (!) 144/77  Pulse: 77 94 (!) 105 72  Resp: 16 15 16 16   Temp:   98.1 F (36.7 C) 98.1 F (36.7 C)  TempSrc:   Oral Oral  SpO2: 96% 95% 96% 99%    General: Pt is alert, awake, not in acute distress Cardiovascular: RRR, S1/S2 +, no rubs, no gallops Respiratory: CTA bilaterally, no wheezing, no rhonchi Abdominal: Soft, NT, ND, bowel  sounds + Extremities: no edema, no cyanosis   Discharge Instructions  Discharge Instructions     Ambulatory referral to Neurology   Complete by: As directed    Follow up with stroke clinic NP (Jessica Vanschaick or Cecille Rubin, if both not available, consider Zachery Dauer, or Ahern) at [facility] in about 4 weeks. Thanks.   Ambulatory referral to Occupational Therapy   Complete by: As directed    Ambulatory referral to Physical Therapy   Complete by: As directed    Diet - low sodium heart healthy   Complete by: As directed    Increase activity slowly   Complete by: As directed       Allergies as of 11/25/2020   No Known Allergies      Medication List     TAKE these medications    amoxicillin 500 MG tablet Commonly known as: AMOXIL Take 4 tablets (2,000 mg total) by mouth as directed. Take 1 hour prior to dental work, including cleanings. What changed:  when to take this reasons to take this additional instructions   atorvastatin 40 MG tablet Commonly known as: LIPITOR TAKE 1 TABLET BY MOUTH IN  THE EVENING   Eliquis 5 MG Tabs tablet Generic drug: apixaban TAKE 1 TABLET BY MOUTH  TWICE DAILY What changed: how much to take   ezetimibe 10 MG tablet Commonly known as: ZETIA TAKE 1 TABLET BY MOUTH IN  THE EVENING   furosemide 20 MG tablet Commonly known as: LASIX TAKE 1 TABLET BY MOUTH  EVERY OTHER DAY, MAY TAKE 1 ADDITIONAL DOSE IF NEEDED  FOR LEG SWELLING What changed: See the new instructions.   OcuSoft Eyelid Cleansing Pads Place 1 application into the left eye in the morning.        Follow-up Information     [facility] Follow up in 1 month(s).   Specialty: Neurology Why: stroke clinic Contact information: [address] [place] Follow up.   Specialty: Rehabilitation Why: Please call the Outpatient therapy on Monday to  schedule an appointment Contact information: [place]. [id number] [place] [address] [phone]               No Known Allergies    CT HEAD WO CONTRAST  Result Date: 11/24/2020 CLINICAL DATA:  Transient ischemic attack EXAM: CT HEAD WITHOUT CONTRAST TECHNIQUE: Contiguous axial images were obtained from the base of the skull through the vertex without intravenous contrast. COMPARISON:  None. FINDINGS: Brain: No acute intracranial hemorrhage. No focal mass lesion. No CT evidence of acute infarction. No midline shift or mass effect. No hydrocephalus. Basilar cisterns are patent. There are periventricular and subcortical white matter hypodensities. Generalized cortical atrophy. Vascular: No hyperdense vessel or unexpected calcification. Skull: Normal. Negative for fracture or focal lesion. Sinuses/Orbits: Paranasal sinuses and mastoid air cells are clear. Orbits are clear. Other: None. IMPRESSION: No acute intracranial findings. Moderate atrophy and white matter microvascular disease. Electronically Signed   By: Suzy Bouchard M.D.   On: 11/24/2020 10:59  MR ANGIO HEAD WO CONTRAST  Result Date: 11/25/2020 CLINICAL DATA:  Initial evaluation for neuro deficit, stroke suspected. EXAM: MRI HEAD WITHOUT CONTRAST MRA HEAD WITHOUT CONTRAST MRA NECK WITHOUT AND WITH CONTRAST TECHNIQUE: Multiplanar, multi-echo pulse sequences of the brain and surrounding structures were acquired without intravenous contrast. Angiographic images of the Circle of Willis were acquired using MRA technique without intravenous contrast. Angiographic images of the neck were acquired using MRA technique without and with intravenous contrast. Carotid stenosis measurements (when applicable) are obtained utilizing NASCET criteria, using the distal internal carotid diameter as the denominator. CONTRAST:  8.35mL GADAVIST GADOBUTROL 1 MMOL/ML IV SOLN COMPARISON:  Prior head CT from earlier the same  day. FINDINGS: MRI HEAD FINDINGS Brain: Diffuse prominence of the CSF containing spaces compatible generalized age-related cerebral atrophy. Patchy and confluent T2/FLAIR hyperintensity within the periventricular deep white matter both cerebral hemispheres most consistent with chronic small vessel ischemic disease, moderate in nature. Few scattered superimposed remote lacunar infarcts present about the hemispheric cerebral white matter. Few small remote lacunar infarcts present about the basal ganglia and right thalamus. Few small remote right cerebellar infarcts noted as well. 1 cm acute ischemic infarcts seen involving the deep white matter of the posterior left frontal centrum semi ovale (series 2, image 37). No associated hemorrhage or mass effect. No other evidence for acute or subacute ischemia. Gray-white matter differentiation otherwise maintained. No encephalomalacia to suggest chronic cortical infarction elsewhere within the brain. No foci of susceptibility artifact to suggest acute or chronic intracranial hemorrhage. No mass lesion, midline shift or mass effect. No hydrocephalus or extra-axial fluid collection. Pituitary gland suprasellar region within normal limits. Midline structures intact. Vascular: Major intracranial vascular flow voids are maintained. Skull and upper cervical spine: Craniocervical junction within normal limits. Bone marrow signal intensity normal. No scalp soft tissue abnormality. Sinuses/Orbits: Patient status post bilateral ocular lens replacement. Globes and orbital soft tissues demonstrate no acute finding. Mild scattered mucosal thickening noted within the ethmoidal air cells. Paranasal sinuses are otherwise clear. No mastoid effusion. Inner ear structures grossly normal. Other: None. MRA HEAD FINDINGS Anterior circulation: Distal cervical segments of the internal carotid arteries are patent with antegrade flow. Petrous, cavernous, and supraclinoid segments patent without  stenosis or other abnormality. A1 segments widely patent. Normal anterior communicating artery complex. Anterior cerebral arteries patent to their distal aspects without stenosis. No M1 stenosis or occlusion. Normal MCA bifurcations. Distal MCA branches well perfused and symmetric. Posterior circulation: Both V4 segments patent to the vertebrobasilar junction without stenosis. Left vertebral artery dominant. Both PICA origins patent and normal. Basilar patent to its distal aspect without stenosis. Superior cerebellar arteries patent bilaterally. Both PCAs primarily supplied via the basilar well perfused or distal aspects. Anatomic variants: None significant.  No aneurysm. MRA NECK FINDINGS Aortic arch: Visualized aortic arch normal in caliber with normal 3 vessel morphology. No hemodynamically significant stenosis seen about the origin of the great vessels. Right carotid system: Right CCA patent from its origin to the bifurcation without stenosis. Minor atheromatous irregularity about the right carotid bulb without stenosis. Right ICA widely patent distally without stenosis, evidence for dissection or occlusion. Left carotid system: Left CCA patent from its origin to the bifurcation without stenosis. Minor atheromatous irregularity about the left carotid bulb without stenosis. Left ICA patent distally without stenosis, evidence for dissection or occlusion. Vertebral arteries: Both vertebral arteries arise from the subclavian arteries. Left vertebral artery dominant. Vertebral arteries patent without stenosis, evidence for dissection or occlusion. Other: None IMPRESSION: MRI  HEAD: 1. 1 cm acute ischemic nonhemorrhagic infarct involving the deep white matter of the posterior left frontal centrum semi ovale. 2. Underlying age-related cerebral atrophy with moderate chronic microvascular ischemic disease, with a few scattered remote lacunar infarcts involving the hemispheric cerebral white matter, basal ganglia, right  thalamus, and right cerebellum. MRA HEAD: Normal intracranial MRA. No large vessel occlusion, hemodynamically significant stenosis, or other acute vascular abnormality. MRA NECK: Negative MRA of the neck. No hemodynamically significant or critical flow limiting stenosis. Electronically Signed   By: Jeannine Boga M.D.   On: 11/25/2020 00:12   MR ANGIO NECK W WO CONTRAST  Result Date: 11/25/2020 CLINICAL DATA:  Initial evaluation for neuro deficit, stroke suspected. EXAM: MRI HEAD WITHOUT CONTRAST MRA HEAD WITHOUT CONTRAST MRA NECK WITHOUT AND WITH CONTRAST TECHNIQUE: Multiplanar, multi-echo pulse sequences of the brain and surrounding structures were acquired without intravenous contrast. Angiographic images of the Circle of Willis were acquired using MRA technique without intravenous contrast. Angiographic images of the neck were acquired using MRA technique without and with intravenous contrast. Carotid stenosis measurements (when applicable) are obtained utilizing NASCET criteria, using the distal internal carotid diameter as the denominator. CONTRAST:  8.46mL GADAVIST GADOBUTROL 1 MMOL/ML IV SOLN COMPARISON:  Prior head CT from earlier the same day. FINDINGS: MRI HEAD FINDINGS Brain: Diffuse prominence of the CSF containing spaces compatible generalized age-related cerebral atrophy. Patchy and confluent T2/FLAIR hyperintensity within the periventricular deep white matter both cerebral hemispheres most consistent with chronic small vessel ischemic disease, moderate in nature. Few scattered superimposed remote lacunar infarcts present about the hemispheric cerebral white matter. Few small remote lacunar infarcts present about the basal ganglia and right thalamus. Few small remote right cerebellar infarcts noted as well. 1 cm acute ischemic infarcts seen involving the deep white matter of the posterior left frontal centrum semi ovale (series 2, image 37). No associated hemorrhage or mass effect. No other  evidence for acute or subacute ischemia. Gray-white matter differentiation otherwise maintained. No encephalomalacia to suggest chronic cortical infarction elsewhere within the brain. No foci of susceptibility artifact to suggest acute or chronic intracranial hemorrhage. No mass lesion, midline shift or mass effect. No hydrocephalus or extra-axial fluid collection. Pituitary gland suprasellar region within normal limits. Midline structures intact. Vascular: Major intracranial vascular flow voids are maintained. Skull and upper cervical spine: Craniocervical junction within normal limits. Bone marrow signal intensity normal. No scalp soft tissue abnormality. Sinuses/Orbits: Patient status post bilateral ocular lens replacement. Globes and orbital soft tissues demonstrate no acute finding. Mild scattered mucosal thickening noted within the ethmoidal air cells. Paranasal sinuses are otherwise clear. No mastoid effusion. Inner ear structures grossly normal. Other: None. MRA HEAD FINDINGS Anterior circulation: Distal cervical segments of the internal carotid arteries are patent with antegrade flow. Petrous, cavernous, and supraclinoid segments patent without stenosis or other abnormality. A1 segments widely patent. Normal anterior communicating artery complex. Anterior cerebral arteries patent to their distal aspects without stenosis. No M1 stenosis or occlusion. Normal MCA bifurcations. Distal MCA branches well perfused and symmetric. Posterior circulation: Both V4 segments patent to the vertebrobasilar junction without stenosis. Left vertebral artery dominant. Both PICA origins patent and normal. Basilar patent to its distal aspect without stenosis. Superior cerebellar arteries patent bilaterally. Both PCAs primarily supplied via the basilar well perfused or distal aspects. Anatomic variants: None significant.  No aneurysm. MRA NECK FINDINGS Aortic arch: Visualized aortic arch normal in caliber with normal 3 vessel  morphology. No hemodynamically significant stenosis seen about the origin of  the great vessels. Right carotid system: Right CCA patent from its origin to the bifurcation without stenosis. Minor atheromatous irregularity about the right carotid bulb without stenosis. Right ICA widely patent distally without stenosis, evidence for dissection or occlusion. Left carotid system: Left CCA patent from its origin to the bifurcation without stenosis. Minor atheromatous irregularity about the left carotid bulb without stenosis. Left ICA patent distally without stenosis, evidence for dissection or occlusion. Vertebral arteries: Both vertebral arteries arise from the subclavian arteries. Left vertebral artery dominant. Vertebral arteries patent without stenosis, evidence for dissection or occlusion. Other: None IMPRESSION: MRI HEAD: 1. 1 cm acute ischemic nonhemorrhagic infarct involving the deep white matter of the posterior left frontal centrum semi ovale. 2. Underlying age-related cerebral atrophy with moderate chronic microvascular ischemic disease, with a few scattered remote lacunar infarcts involving the hemispheric cerebral white matter, basal ganglia, right thalamus, and right cerebellum. MRA HEAD: Normal intracranial MRA. No large vessel occlusion, hemodynamically significant stenosis, or other acute vascular abnormality. MRA NECK: Negative MRA of the neck. No hemodynamically significant or critical flow limiting stenosis. Electronically Signed   By: Jeannine Boga M.D.   On: 11/25/2020 00:12   MR BRAIN WO CONTRAST  Result Date: 11/25/2020 CLINICAL DATA:  Initial evaluation for neuro deficit, stroke suspected. EXAM: MRI HEAD WITHOUT CONTRAST MRA HEAD WITHOUT CONTRAST MRA NECK WITHOUT AND WITH CONTRAST TECHNIQUE: Multiplanar, multi-echo pulse sequences of the brain and surrounding structures were acquired without intravenous contrast. Angiographic images of the Circle of Willis were acquired using MRA  technique without intravenous contrast. Angiographic images of the neck were acquired using MRA technique without and with intravenous contrast. Carotid stenosis measurements (when applicable) are obtained utilizing NASCET criteria, using the distal internal carotid diameter as the denominator. CONTRAST:  8.19mL GADAVIST GADOBUTROL 1 MMOL/ML IV SOLN COMPARISON:  Prior head CT from earlier the same day. FINDINGS: MRI HEAD FINDINGS Brain: Diffuse prominence of the CSF containing spaces compatible generalized age-related cerebral atrophy. Patchy and confluent T2/FLAIR hyperintensity within the periventricular deep white matter both cerebral hemispheres most consistent with chronic small vessel ischemic disease, moderate in nature. Few scattered superimposed remote lacunar infarcts present about the hemispheric cerebral white matter. Few small remote lacunar infarcts present about the basal ganglia and right thalamus. Few small remote right cerebellar infarcts noted as well. 1 cm acute ischemic infarcts seen involving the deep white matter of the posterior left frontal centrum semi ovale (series 2, image 37). No associated hemorrhage or mass effect. No other evidence for acute or subacute ischemia. Gray-white matter differentiation otherwise maintained. No encephalomalacia to suggest chronic cortical infarction elsewhere within the brain. No foci of susceptibility artifact to suggest acute or chronic intracranial hemorrhage. No mass lesion, midline shift or mass effect. No hydrocephalus or extra-axial fluid collection. Pituitary gland suprasellar region within normal limits. Midline structures intact. Vascular: Major intracranial vascular flow voids are maintained. Skull and upper cervical spine: Craniocervical junction within normal limits. Bone marrow signal intensity normal. No scalp soft tissue abnormality. Sinuses/Orbits: Patient status post bilateral ocular lens replacement. Globes and orbital soft tissues  demonstrate no acute finding. Mild scattered mucosal thickening noted within the ethmoidal air cells. Paranasal sinuses are otherwise clear. No mastoid effusion. Inner ear structures grossly normal. Other: None. MRA HEAD FINDINGS Anterior circulation: Distal cervical segments of the internal carotid arteries are patent with antegrade flow. Petrous, cavernous, and supraclinoid segments patent without stenosis or other abnormality. A1 segments widely patent. Normal anterior communicating artery complex. Anterior cerebral arteries patent to  their distal aspects without stenosis. No M1 stenosis or occlusion. Normal MCA bifurcations. Distal MCA branches well perfused and symmetric. Posterior circulation: Both V4 segments patent to the vertebrobasilar junction without stenosis. Left vertebral artery dominant. Both PICA origins patent and normal. Basilar patent to its distal aspect without stenosis. Superior cerebellar arteries patent bilaterally. Both PCAs primarily supplied via the basilar well perfused or distal aspects. Anatomic variants: None significant.  No aneurysm. MRA NECK FINDINGS Aortic arch: Visualized aortic arch normal in caliber with normal 3 vessel morphology. No hemodynamically significant stenosis seen about the origin of the great vessels. Right carotid system: Right CCA patent from its origin to the bifurcation without stenosis. Minor atheromatous irregularity about the right carotid bulb without stenosis. Right ICA widely patent distally without stenosis, evidence for dissection or occlusion. Left carotid system: Left CCA patent from its origin to the bifurcation without stenosis. Minor atheromatous irregularity about the left carotid bulb without stenosis. Left ICA patent distally without stenosis, evidence for dissection or occlusion. Vertebral arteries: Both vertebral arteries arise from the subclavian arteries. Left vertebral artery dominant. Vertebral arteries patent without stenosis, evidence  for dissection or occlusion. Other: None IMPRESSION: MRI HEAD: 1. 1 cm acute ischemic nonhemorrhagic infarct involving the deep white matter of the posterior left frontal centrum semi ovale. 2. Underlying age-related cerebral atrophy with moderate chronic microvascular ischemic disease, with a few scattered remote lacunar infarcts involving the hemispheric cerebral white matter, basal ganglia, right thalamus, and right cerebellum. MRA HEAD: Normal intracranial MRA. No large vessel occlusion, hemodynamically significant stenosis, or other acute vascular abnormality. MRA NECK: Negative MRA of the neck. No hemodynamically significant or critical flow limiting stenosis. Electronically Signed   By: Jeannine Boga M.D.   On: 11/25/2020 00:12   ECHOCARDIOGRAM COMPLETE BUBBLE STUDY  Result Date: 11/25/2020    ECHOCARDIOGRAM REPORT   Patient Name:   BENGIE KAUCHER IONGEX Date of Exam: 11/25/2020 Medical Rec #:  [id number]       Height:       72.0 in Accession #:    [id number]      Weight:       192.2 lb Date of Birth:  11/13/1929       BSA:          2.095 m Patient Age:    87 years        BP:           152/87 mmHg Patient Gender: M               HR:           83 bpm. Exam Location:  Inpatient Procedure: 2D Echo, Cardiac Doppler and Color Doppler Indications:    TIA  History:        Patient has prior history of Echocardiogram examinations, most                 recent 11/25/2019.  Sonographer:    Cammy Brochure Referring Phys: [id number] [place]  1. Left ventricular ejection fraction, by estimation, is 55 to 60%. The left ventricle has normal function. The left ventricle has no regional wall motion abnormalities. There is mild left ventricular hypertrophy. Left ventricular diastolic parameters are indeterminate.  2. Right ventricular systolic function is mildly reduced. The right ventricular size is moderately enlarged. There is normal pulmonary artery systolic pressure. The estimated right ventricular systolic  pressure is 25.9 mmHg.  3. Left atrial size was mildly dilated.  4. Right atrial size  was moderately dilated.  5. The mitral valve has been repaired/replaced. No evidence of mitral valve regurgitation. 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 The mean mitral valve gradient is 4.0 mmHg at 56bpm  6. The aortic valve has been repaired/replaced. Aortic valve regurgitation is trivial. 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 MG 7 mmHg. Echo findings are consistent with normal structure and function of the aortic valve prosthesis.  7. Aortic dilatation noted. There is mild dilatation of the ascending aorta, measuring 39 mm.  8. The inferior vena cava is dilated in size with >50% respiratory variability, suggesting right atrial pressure of 8 mmHg.  9. Agitated saline contrast bubble study was negative, with no evidence of any interatrial shunt. FINDINGS  Left Ventricle: Left ventricular ejection fraction, by estimation, is 55 to 60%. The left ventricle has normal function. The left ventricle has no regional wall motion abnormalities. The left ventricular internal cavity size was normal in size. There is  mild left ventricular hypertrophy. Left ventricular diastolic parameters are indeterminate. Right Ventricle: The right ventricular size is moderately enlarged. No increase in right ventricular wall thickness. Right ventricular systolic function is mildly reduced. There is normal pulmonary artery systolic pressure. The tricuspid regurgitant velocity is 2.31 m/s, and with an assumed right atrial pressure of 8 mmHg, the estimated right ventricular systolic pressure is 70.3 mmHg. Left Atrium: Left atrial size was mildly dilated. Right Atrium: Right atrial size was moderately dilated. Pericardium: There is no evidence of pericardial effusion. Mitral Valve: The mitral valve has been repaired/replaced. No evidence of mitral valve regurgitation.  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 MV peak gradient, 14.0 mmHg. The mean mitral valve gradient is 4.0 mmHg. Tricuspid Valve: The tricuspid valve is normal in structure. Tricuspid valve regurgitation is trivial. Aortic Valve: The aortic valve has been repaired/replaced. Aortic valve regurgitation is trivial. Aortic valve mean gradient measures 5.5 mmHg. Aortic valve peak gradient measures 10.2 mmHg. Aortic valve area, by VTI measures 2.10 cm. 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 Echo findings are consistent with normal structure and function of the aortic valve prosthesis. Pulmonic Valve: The pulmonic valve was not well visualized. Pulmonic valve regurgitation is not visualized. Aorta: Aortic dilatation noted. There is mild dilatation of the ascending aorta, measuring 39 mm. Venous: The inferior vena cava is dilated in size with greater than 50% respiratory variability, suggesting right atrial pressure of 8 mmHg. IAS/Shunts: The interatrial septum was not well visualized. Agitated saline contrast was given intravenously to evaluate for intracardiac shunting. Agitated saline contrast bubble study was negative, with no evidence of any interatrial shunt.  LEFT VENTRICLE PLAX 2D LVIDd:         4.95 cm LVIDs:         3.30 cm LV PW:         1.05 cm LV IVS:        0.95 cm LVOT diam:     2.00 cm LV SV:         60 LV SV Index:   28 LVOT Area:     3.14 cm  RIGHT VENTRICLE            IVC RV Basal diam:  4.90 cm    IVC diam: 2.40 cm RV S prime:     8.38 cm/s LEFT ATRIUM  Index       RIGHT ATRIUM           Index LA diam:        3.70 cm 1.77 cm/m  RA Area:     25.80 cm LA Vol (A2C):   80.0 ml 38.19 ml/m RA Volume:   79.85 ml  38.12 ml/m LA Vol (A4C):   84.8 ml 40.48 ml/m LA Biplane Vol: 84.0 ml 40.10 ml/m  AORTIC VALVE AV Area (Vmax):    2.17 cm AV Area (Vmean):   2.18 cm AV Area (VTI):     2.10 cm AV Vmax:            159.50 cm/s AV Vmean:          107.600 cm/s AV VTI:            0.284 m AV Peak Grad:      10.2 mmHg AV Mean Grad:      5.5 mmHg LVOT Vmax:         110.00 cm/s LVOT Vmean:        74.600 cm/s LVOT VTI:          0.190 m LVOT/AV VTI ratio: 0.67  AORTA Ao Asc diam: 3.90 cm MITRAL VALVE                TRICUSPID VALVE MV Area (PHT): 2.96 cm     TR Peak grad:   21.3 mmHg MV Area VTI:   1.18 cm     TR Vmax:        231.00 cm/s MV Peak grad:  14.0 mmHg MV Mean grad:  4.0 mmHg     SHUNTS MV Vmax:       1.87 m/s     Systemic VTI:  0.19 m MV Vmean:      81.1 cm/s    Systemic Diam: 2.00 cm MV Decel Time: 256 msec MV E velocity: 175.00 cm/s Oswaldo Milian MD Electronically signed by Oswaldo Milian MD Signature Date/Time: 11/25/2020/2:23:37 PM    Final      The results of significant diagnostics from this hospitalization (including imaging, microbiology, ancillary and laboratory) are listed below for reference.     Microbiology: Recent Results (from the past 240 hour(s))  Resp Panel by RT-PCR (Flu A&B, Covid) Nasopharyngeal Swab     Status: None   Collection Time: 11/24/20  7:57 PM   Specimen: Nasopharyngeal Swab; Nasopharyngeal(NP) swabs in vial transport medium  Result Value Ref Range Status   SARS Coronavirus 2 by RT PCR NEGATIVE NEGATIVE Final    Comment: (NOTE) SARS-CoV-2 target nucleic acids are NOT DETECTED.  The SARS-CoV-2 RNA is generally detectable in upper respiratory specimens during the acute phase of infection. The lowest concentration of SARS-CoV-2 viral copies this assay can detect is 138 copies/mL. A negative result does not preclude SARS-Cov-2 infection and should not be used as the sole basis for treatment or other patient management decisions. A negative result may occur with  improper specimen collection/handling, submission of specimen other than nasopharyngeal swab, presence of viral mutation(s) within the areas targeted by this assay, and inadequate number of  viral copies(<138 copies/mL). A negative result must be combined with clinical observations, patient history, and epidemiological information. The expected result is Negative.  Fact Sheet for Patients:  EntrepreneurPulse.com.au  Fact Sheet for Healthcare Providers:  IncredibleEmployment.be  This test is no t yet approved or cleared by the [place] FDA and  has been authorized for detection and/or diagnosis of SARS-CoV-2  by FDA under an Emergency Use Authorization (EUA). This EUA will remain  in effect (meaning this test can be used) for the duration of the COVID-19 declaration under Section 564(b)(1) of the Act, 21 U.S.C.section 360bbb-3(b)(1), unless the authorization is terminated  or revoked sooner.       Influenza A by PCR NEGATIVE NEGATIVE Final   Influenza B by PCR NEGATIVE NEGATIVE Final    Comment: (NOTE) The Xpert Xpress SARS-CoV-2/FLU/RSV plus assay is intended as an aid in the diagnosis of influenza from Nasopharyngeal swab specimens and should not be used as a sole basis for treatment. Nasal washings and aspirates are unacceptable for Xpert Xpress SARS-CoV-2/FLU/RSV testing.  Fact Sheet for Patients: EntrepreneurPulse.com.au  Fact Sheet for Healthcare Providers: IncredibleEmployment.be  This test is not yet approved or cleared by the [place] FDA and has been authorized for detection and/or diagnosis of SARS-CoV-2 by FDA under an Emergency Use Authorization (EUA). This EUA will remain in effect (meaning this test can be used) for the duration of the COVID-19 declaration under Section 564(b)(1) of the Act, 21 U.S.C. section 360bbb-3(b)(1), unless the authorization is terminated or revoked.  Performed at [facility], [place] [address] [place], [place] [address]      Labs: BNP (last 3 results) No results for input(s): BNP in the last 8760 hours. Basic Metabolic  Panel: Recent Labs  Lab 11/24/20 0950 11/24/20 1025 11/25/20 0258  NA 138 140 136  K 4.1 4.1 3.7  CL 106 105 106  CO2 25  --  23  GLUCOSE 123* 124* 95  BUN 15 17 13   CREATININE 0.93 0.80 0.85  CALCIUM 9.5  --  9.3  MG  --   --  1.9  PHOS  --   --  3.2   Liver Function Tests: Recent Labs  Lab 11/24/20 0950  AST 27  ALT 20  ALKPHOS 52  BILITOT 3.9*  PROT 7.0  ALBUMIN 4.0   No results for input(s): LIPASE, AMYLASE in the last 168 hours. No results for input(s): AMMONIA in the last 168 hours. CBC: Recent Labs  Lab 11/24/20 0950 11/24/20 1025 11/25/20 0258  WBC 4.4  --  5.2  NEUTROABS 2.7  --   --   HGB 15.4 16.0 15.1  HCT 45.8 47.0 43.7  MCV 92.7  --  92.0  PLT 155  --  138*   Cardiac Enzymes: No results for input(s): CKTOTAL, CKMB, CKMBINDEX, TROPONINI in the last 168 hours. BNP: Invalid input(s): POCBNP CBG: No results for input(s): GLUCAP in the last 168 hours. D-Dimer No results for input(s): DDIMER in the last 72 hours. Hgb A1c Recent Labs    11/25/20 0259  HGBA1C 5.8*   Lipid Profile Recent Labs    11/25/20 0258  CHOL 127  HDL 50  LDLCALC 66  TRIG 57  CHOLHDL 2.5   Thyroid function studies No results for input(s): TSH, T4TOTAL, T3FREE, THYROIDAB in the last 72 hours.  Invalid input(s): FREET3 Anemia work up No results for input(s): VITAMINB12, FOLATE, FERRITIN, TIBC, IRON, RETICCTPCT in the last 72 hours. Urinalysis    Component Value Date/Time   COLORURINE YELLOW 11/24/2020 0950   APPEARANCEUR CLEAR 11/24/2020 0950   LABSPEC 1.010 11/24/2020 0950   PHURINE 7.0 11/24/2020 0950   GLUCOSEU NEGATIVE 11/24/2020 0950   HGBUR NEGATIVE 11/24/2020 0950   BILIRUBINUR NEGATIVE 11/24/2020 0950   KETONESUR NEGATIVE 11/24/2020 0950   PROTEINUR NEGATIVE 11/24/2020 0950   UROBILINOGEN 1.0 06/17/2007 1709   NITRITE  NEGATIVE 11/24/2020 0950   LEUKOCYTESUR TRACE (A) 11/24/2020 0950   Sepsis Labs Invalid input(s): PROCALCITONIN,  WBC,   LACTICIDVEN Microbiology Recent Results (from the past 240 hour(s))  Resp Panel by RT-PCR (Flu A&B, Covid) Nasopharyngeal Swab     Status: None   Collection Time: 11/24/20  7:57 PM   Specimen: Nasopharyngeal Swab; Nasopharyngeal(NP) swabs in vial transport medium  Result Value Ref Range Status   SARS Coronavirus 2 by RT PCR NEGATIVE NEGATIVE Final    Comment: (NOTE) SARS-CoV-2 target nucleic acids are NOT DETECTED.  The SARS-CoV-2 RNA is generally detectable in upper respiratory specimens during the acute phase of infection. The lowest concentration of SARS-CoV-2 viral copies this assay can detect is 138 copies/mL. A negative result does not preclude SARS-Cov-2 infection and should not be used as the sole basis for treatment or other patient management decisions. A negative result may occur with  improper specimen collection/handling, submission of specimen other than nasopharyngeal swab, presence of viral mutation(s) within the areas targeted by this assay, and inadequate number of viral copies(<138 copies/mL). A negative result must be combined with clinical observations, patient history, and epidemiological information. The expected result is Negative.  Fact Sheet for Patients:  EntrepreneurPulse.com.au  Fact Sheet for Healthcare Providers:  IncredibleEmployment.be  This test is no t yet approved or cleared by the [place] FDA and  has been authorized for detection and/or diagnosis of SARS-CoV-2 by FDA under an Emergency Use Authorization (EUA). This EUA will remain  in effect (meaning this test can be used) for the duration of the COVID-19 declaration under Section 564(b)(1) of the Act, 21 U.S.C.section 360bbb-3(b)(1), unless the authorization is terminated  or revoked sooner.       Influenza A by PCR NEGATIVE NEGATIVE Final   Influenza B by PCR NEGATIVE NEGATIVE Final    Comment: (NOTE) The Xpert Xpress SARS-CoV-2/FLU/RSV plus  assay is intended as an aid in the diagnosis of influenza from Nasopharyngeal swab specimens and should not be used as a sole basis for treatment. Nasal washings and aspirates are unacceptable for Xpert Xpress SARS-CoV-2/FLU/RSV testing.  Fact Sheet for Patients: EntrepreneurPulse.com.au  Fact Sheet for Healthcare Providers: IncredibleEmployment.be  This test is not yet approved or cleared by the [place] FDA and has been authorized for detection and/or diagnosis of SARS-CoV-2 by FDA under an Emergency Use Authorization (EUA). This EUA will remain in effect (meaning this test can be used) for the duration of the COVID-19 declaration under Section 564(b)(1) of the Act, 21 U.S.C. section 360bbb-3(b)(1), unless the authorization is terminated or revoked.  Performed at [facility], [place] [address], [place], [place] [address]      Time coordinating discharge in minutes: 65  SIGNED:   Debbe Odea, MD  [facility] 11/25/2020, 3:39 PM

## 2020-11-25 NOTE — Plan of Care (Signed)
  Problem: Education: Goal: Knowledge of disease or condition will improve Outcome: Adequate for Discharge Goal: Knowledge of secondary prevention will improve Outcome: Adequate for Discharge Goal: Knowledge of patient specific risk factors addressed and post discharge goals established will improve Outcome: Adequate for Discharge Goal: Individualized Educational Video(s) Outcome: Adequate for Discharge   Problem: Coping: Goal: Will verbalize positive feelings about self Outcome: Adequate for Discharge Goal: Will identify appropriate support needs Outcome: Adequate for Discharge   Problem: Health Behavior/Discharge Planning: Goal: Ability to manage health-related needs will improve Outcome: Adequate for Discharge   Problem: Self-Care: Goal: Ability to participate in self-care as condition permits will improve Outcome: Adequate for Discharge

## 2020-11-25 NOTE — ED Notes (Signed)
Tried to call nurse report but that nurse just received a new patient and cannot take report at this time. They will call my phone when they have time to take report.

## 2020-11-25 NOTE — ED Notes (Signed)
Patient up and walking to the restroom with walker about 131ft. Patient did well, walking without a walker is not safe for this patient. Now sitting up eating, denies other needs at this time.

## 2020-11-25 NOTE — Progress Notes (Addendum)
STROKE TEAM PROGRESS NOTE   ATTENDING NOTE: I reviewed above note and agree with the assessment and plan. Pt was seen and examined.   87 year old male with history of A. fib on Eliquis, DVT/PE, AV repair x2 admitted for right hand weakness, right leg weakness since Wednesday night.  Currently recovered well, symptoms not resolved.  CT no acute abnormality.  MRI showed left semiovale small infarct.  Old lacunar infarct bilateral white matter, BG, right thalamic, right cerebellum.  MRA head and neck negative.  LDL 66, A1c 5.8, EF 55 to 60%.  Creatinine 0.85.  On exam, wife and RN at bedside.  Patient lying in bed, awake alert orientated x3, no aphasia follows simple commands, able to name and repeat.  No gaze palsy, visual field full, slight right nasolabial fold flattening.  Bilateral upper and lower extremity muscle strength 5/5.  Sensation symmetrical.  Lateral finger-to-nose grossly intact with mild action tremor.  Gait not tested.  Etiology for patient stroke likely due to small vessel disease given location.  Currently recovered well.  Recommend aspirin 81 on top of Eliquis for further stroke prevention.  Continue home Lipitor 40 and Zetia.  PT/OT recommend outpatient PT/OT.  Patient will follow up with his cardiologist.  We will also set up stroke neurology follow-up in 4 weeks.  Neurology will sign off. Please call with questions. Pt will follow up with stroke clinic NP at [facility] in about 4 weeks. Thanks for the consult.   Rosalin Hawking, MD PhD Stroke Neurology 11/25/2020 2:58 PM   INTERVAL HISTORY His wife is at bedside and is a very good historian.  Concerned patient is hypertensive SBP 160; explained permissive hypertension to wife and patient and they are agreeable.  Vitals:   11/25/20 0712 11/25/20 0715 11/25/20 0730 11/25/20 0745  BP: (!) 120/105 (!) 149/83 (!) 151/101 (!) 128/99  Pulse: 80 83 72 73  Resp: 18 16 14 16   Temp:      TempSrc:      SpO2: 97% 96% 95% 93%   CBC:   Recent Labs  Lab 11/24/20 0950 11/24/20 1025 11/25/20 0258  WBC 4.4  --  5.2  NEUTROABS 2.7  --   --   HGB 15.4 16.0 15.1  HCT 45.8 47.0 43.7  MCV 92.7  --  92.0  PLT 155  --  093*   Basic Metabolic Panel:  Recent Labs  Lab 11/24/20 0950 11/24/20 1025 11/25/20 0258  NA 138 140 136  K 4.1 4.1 3.7  CL 106 105 106  CO2 25  --  23  GLUCOSE 123* 124* 95  BUN 15 17 13   CREATININE 0.93 0.80 0.85  CALCIUM 9.5  --  9.3  MG  --   --  1.9  PHOS  --   --  3.2   Lipid Panel:  Recent Labs  Lab 11/25/20 0258  CHOL 127  TRIG 57  HDL 50  CHOLHDL 2.5  VLDL 11  LDLCALC 66   HgbA1c:  Recent Labs  Lab 11/25/20 0259  HGBA1C 5.8*   Urine Drug Screen:  Recent Labs  Lab 11/24/20 0950  LABOPIA NONE DETECTED  COCAINSCRNUR NONE DETECTED  LABBENZ NONE DETECTED  AMPHETMU NONE DETECTED  THCU NONE DETECTED  LABBARB NONE DETECTED    Alcohol Level  Recent Labs  Lab 11/24/20 0950  ETH <10    IMAGING past 24 hours CT HEAD WO CONTRAST  Result Date: 11/24/2020 CLINICAL DATA:  Transient ischemic attack EXAM: CT HEAD WITHOUT CONTRAST TECHNIQUE:  Contiguous axial images were obtained from the base of the skull through the vertex without intravenous contrast. COMPARISON:  None. FINDINGS: Brain: No acute intracranial hemorrhage. No focal mass lesion. No CT evidence of acute infarction. No midline shift or mass effect. No hydrocephalus. Basilar cisterns are patent. There are periventricular and subcortical white matter hypodensities. Generalized cortical atrophy. Vascular: No hyperdense vessel or unexpected calcification. Skull: Normal. Negative for fracture or focal lesion. Sinuses/Orbits: Paranasal sinuses and mastoid air cells are clear. Orbits are clear. Other: None. IMPRESSION: No acute intracranial findings. Moderate atrophy and white matter microvascular disease. Electronically Signed   By: Suzy Bouchard M.D.   On: 11/24/2020 10:59   MR ANGIO HEAD WO CONTRAST  Result Date:  11/25/2020 CLINICAL DATA:  Initial evaluation for neuro deficit, stroke suspected. EXAM: MRI HEAD WITHOUT CONTRAST MRA HEAD WITHOUT CONTRAST MRA NECK WITHOUT AND WITH CONTRAST TECHNIQUE: Multiplanar, multi-echo pulse sequences of the brain and surrounding structures were acquired without intravenous contrast. Angiographic images of the Circle of Willis were acquired using MRA technique without intravenous contrast. Angiographic images of the neck were acquired using MRA technique without and with intravenous contrast. Carotid stenosis measurements (when applicable) are obtained utilizing NASCET criteria, using the distal internal carotid diameter as the denominator. CONTRAST:  8.32mL GADAVIST GADOBUTROL 1 MMOL/ML IV SOLN COMPARISON:  Prior head CT from earlier the same day. FINDINGS: MRI HEAD FINDINGS Brain: Diffuse prominence of the CSF containing spaces compatible generalized age-related cerebral atrophy. Patchy and confluent T2/FLAIR hyperintensity within the periventricular deep white matter both cerebral hemispheres most consistent with chronic small vessel ischemic disease, moderate in nature. Few scattered superimposed remote lacunar infarcts present about the hemispheric cerebral white matter. Few small remote lacunar infarcts present about the basal ganglia and right thalamus. Few small remote right cerebellar infarcts noted as well. 1 cm acute ischemic infarcts seen involving the deep white matter of the posterior left frontal centrum semi ovale (series 2, image 37). No associated hemorrhage or mass effect. No other evidence for acute or subacute ischemia. Gray-white matter differentiation otherwise maintained. No encephalomalacia to suggest chronic cortical infarction elsewhere within the brain. No foci of susceptibility artifact to suggest acute or chronic intracranial hemorrhage. No mass lesion, midline shift or mass effect. No hydrocephalus or extra-axial fluid collection. Pituitary gland suprasellar  region within normal limits. Midline structures intact. Vascular: Major intracranial vascular flow voids are maintained. Skull and upper cervical spine: Craniocervical junction within normal limits. Bone marrow signal intensity normal. No scalp soft tissue abnormality. Sinuses/Orbits: Patient status post bilateral ocular lens replacement. Globes and orbital soft tissues demonstrate no acute finding. Mild scattered mucosal thickening noted within the ethmoidal air cells. Paranasal sinuses are otherwise clear. No mastoid effusion. Inner ear structures grossly normal. Other: None. MRA HEAD FINDINGS Anterior circulation: Distal cervical segments of the internal carotid arteries are patent with antegrade flow. Petrous, cavernous, and supraclinoid segments patent without stenosis or other abnormality. A1 segments widely patent. Normal anterior communicating artery complex. Anterior cerebral arteries patent to their distal aspects without stenosis. No M1 stenosis or occlusion. Normal MCA bifurcations. Distal MCA branches well perfused and symmetric. Posterior circulation: Both V4 segments patent to the vertebrobasilar junction without stenosis. Left vertebral artery dominant. Both PICA origins patent and normal. Basilar patent to its distal aspect without stenosis. Superior cerebellar arteries patent bilaterally. Both PCAs primarily supplied via the basilar well perfused or distal aspects. Anatomic variants: None significant.  No aneurysm. MRA NECK FINDINGS Aortic arch: Visualized aortic arch normal in caliber  with normal 3 vessel morphology. No hemodynamically significant stenosis seen about the origin of the great vessels. Right carotid system: Right CCA patent from its origin to the bifurcation without stenosis. Minor atheromatous irregularity about the right carotid bulb without stenosis. Right ICA widely patent distally without stenosis, evidence for dissection or occlusion. Left carotid system: Left CCA patent from  its origin to the bifurcation without stenosis. Minor atheromatous irregularity about the left carotid bulb without stenosis. Left ICA patent distally without stenosis, evidence for dissection or occlusion. Vertebral arteries: Both vertebral arteries arise from the subclavian arteries. Left vertebral artery dominant. Vertebral arteries patent without stenosis, evidence for dissection or occlusion. Other: None IMPRESSION: MRI HEAD: 1. 1 cm acute ischemic nonhemorrhagic infarct involving the deep white matter of the posterior left frontal centrum semi ovale. 2. Underlying age-related cerebral atrophy with moderate chronic microvascular ischemic disease, with a few scattered remote lacunar infarcts involving the hemispheric cerebral white matter, basal ganglia, right thalamus, and right cerebellum. MRA HEAD: Normal intracranial MRA. No large vessel occlusion, hemodynamically significant stenosis, or other acute vascular abnormality. MRA NECK: Negative MRA of the neck. No hemodynamically significant or critical flow limiting stenosis. Electronically Signed   By: Jeannine Boga M.D.   On: 11/25/2020 00:12   MR ANGIO NECK W WO CONTRAST  Result Date: 11/25/2020 CLINICAL DATA:  Initial evaluation for neuro deficit, stroke suspected. EXAM: MRI HEAD WITHOUT CONTRAST MRA HEAD WITHOUT CONTRAST MRA NECK WITHOUT AND WITH CONTRAST TECHNIQUE: Multiplanar, multi-echo pulse sequences of the brain and surrounding structures were acquired without intravenous contrast. Angiographic images of the Circle of Willis were acquired using MRA technique without intravenous contrast. Angiographic images of the neck were acquired using MRA technique without and with intravenous contrast. Carotid stenosis measurements (when applicable) are obtained utilizing NASCET criteria, using the distal internal carotid diameter as the denominator. CONTRAST:  8.72mL GADAVIST GADOBUTROL 1 MMOL/ML IV SOLN COMPARISON:  Prior head CT from earlier the same  day. FINDINGS: MRI HEAD FINDINGS Brain: Diffuse prominence of the CSF containing spaces compatible generalized age-related cerebral atrophy. Patchy and confluent T2/FLAIR hyperintensity within the periventricular deep white matter both cerebral hemispheres most consistent with chronic small vessel ischemic disease, moderate in nature. Few scattered superimposed remote lacunar infarcts present about the hemispheric cerebral white matter. Few small remote lacunar infarcts present about the basal ganglia and right thalamus. Few small remote right cerebellar infarcts noted as well. 1 cm acute ischemic infarcts seen involving the deep white matter of the posterior left frontal centrum semi ovale (series 2, image 37). No associated hemorrhage or mass effect. No other evidence for acute or subacute ischemia. Gray-white matter differentiation otherwise maintained. No encephalomalacia to suggest chronic cortical infarction elsewhere within the brain. No foci of susceptibility artifact to suggest acute or chronic intracranial hemorrhage. No mass lesion, midline shift or mass effect. No hydrocephalus or extra-axial fluid collection. Pituitary gland suprasellar region within normal limits. Midline structures intact. Vascular: Major intracranial vascular flow voids are maintained. Skull and upper cervical spine: Craniocervical junction within normal limits. Bone marrow signal intensity normal. No scalp soft tissue abnormality. Sinuses/Orbits: Patient status post bilateral ocular lens replacement. Globes and orbital soft tissues demonstrate no acute finding. Mild scattered mucosal thickening noted within the ethmoidal air cells. Paranasal sinuses are otherwise clear. No mastoid effusion. Inner ear structures grossly normal. Other: None. MRA HEAD FINDINGS Anterior circulation: Distal cervical segments of the internal carotid arteries are patent with antegrade flow. Petrous, cavernous, and supraclinoid segments patent without  stenosis  or other abnormality. A1 segments widely patent. Normal anterior communicating artery complex. Anterior cerebral arteries patent to their distal aspects without stenosis. No M1 stenosis or occlusion. Normal MCA bifurcations. Distal MCA branches well perfused and symmetric. Posterior circulation: Both V4 segments patent to the vertebrobasilar junction without stenosis. Left vertebral artery dominant. Both PICA origins patent and normal. Basilar patent to its distal aspect without stenosis. Superior cerebellar arteries patent bilaterally. Both PCAs primarily supplied via the basilar well perfused or distal aspects. Anatomic variants: None significant.  No aneurysm. MRA NECK FINDINGS Aortic arch: Visualized aortic arch normal in caliber with normal 3 vessel morphology. No hemodynamically significant stenosis seen about the origin of the great vessels. Right carotid system: Right CCA patent from its origin to the bifurcation without stenosis. Minor atheromatous irregularity about the right carotid bulb without stenosis. Right ICA widely patent distally without stenosis, evidence for dissection or occlusion. Left carotid system: Left CCA patent from its origin to the bifurcation without stenosis. Minor atheromatous irregularity about the left carotid bulb without stenosis. Left ICA patent distally without stenosis, evidence for dissection or occlusion. Vertebral arteries: Both vertebral arteries arise from the subclavian arteries. Left vertebral artery dominant. Vertebral arteries patent without stenosis, evidence for dissection or occlusion. Other: None IMPRESSION: MRI HEAD: 1. 1 cm acute ischemic nonhemorrhagic infarct involving the deep white matter of the posterior left frontal centrum semi ovale. 2. Underlying age-related cerebral atrophy with moderate chronic microvascular ischemic disease, with a few scattered remote lacunar infarcts involving the hemispheric cerebral white matter, basal ganglia, right  thalamus, and right cerebellum. MRA HEAD: Normal intracranial MRA. No large vessel occlusion, hemodynamically significant stenosis, or other acute vascular abnormality. MRA NECK: Negative MRA of the neck. No hemodynamically significant or critical flow limiting stenosis. Electronically Signed   By: Jeannine Boga M.D.   On: 11/25/2020 00:12   MR BRAIN WO CONTRAST  Result Date: 11/25/2020 CLINICAL DATA:  Initial evaluation for neuro deficit, stroke suspected. EXAM: MRI HEAD WITHOUT CONTRAST MRA HEAD WITHOUT CONTRAST MRA NECK WITHOUT AND WITH CONTRAST TECHNIQUE: Multiplanar, multi-echo pulse sequences of the brain and surrounding structures were acquired without intravenous contrast. Angiographic images of the Circle of Willis were acquired using MRA technique without intravenous contrast. Angiographic images of the neck were acquired using MRA technique without and with intravenous contrast. Carotid stenosis measurements (when applicable) are obtained utilizing NASCET criteria, using the distal internal carotid diameter as the denominator. CONTRAST:  8.11mL GADAVIST GADOBUTROL 1 MMOL/ML IV SOLN COMPARISON:  Prior head CT from earlier the same day. FINDINGS: MRI HEAD FINDINGS Brain: Diffuse prominence of the CSF containing spaces compatible generalized age-related cerebral atrophy. Patchy and confluent T2/FLAIR hyperintensity within the periventricular deep white matter both cerebral hemispheres most consistent with chronic small vessel ischemic disease, moderate in nature. Few scattered superimposed remote lacunar infarcts present about the hemispheric cerebral white matter. Few small remote lacunar infarcts present about the basal ganglia and right thalamus. Few small remote right cerebellar infarcts noted as well. 1 cm acute ischemic infarcts seen involving the deep white matter of the posterior left frontal centrum semi ovale (series 2, image 37). No associated hemorrhage or mass effect. No other evidence  for acute or subacute ischemia. Gray-white matter differentiation otherwise maintained. No encephalomalacia to suggest chronic cortical infarction elsewhere within the brain. No foci of susceptibility artifact to suggest acute or chronic intracranial hemorrhage. No mass lesion, midline shift or mass effect. No hydrocephalus or extra-axial fluid collection. Pituitary gland suprasellar region within normal limits.  Midline structures intact. Vascular: Major intracranial vascular flow voids are maintained. Skull and upper cervical spine: Craniocervical junction within normal limits. Bone marrow signal intensity normal. No scalp soft tissue abnormality. Sinuses/Orbits: Patient status post bilateral ocular lens replacement. Globes and orbital soft tissues demonstrate no acute finding. Mild scattered mucosal thickening noted within the ethmoidal air cells. Paranasal sinuses are otherwise clear. No mastoid effusion. Inner ear structures grossly normal. Other: None. MRA HEAD FINDINGS Anterior circulation: Distal cervical segments of the internal carotid arteries are patent with antegrade flow. Petrous, cavernous, and supraclinoid segments patent without stenosis or other abnormality. A1 segments widely patent. Normal anterior communicating artery complex. Anterior cerebral arteries patent to their distal aspects without stenosis. No M1 stenosis or occlusion. Normal MCA bifurcations. Distal MCA branches well perfused and symmetric. Posterior circulation: Both V4 segments patent to the vertebrobasilar junction without stenosis. Left vertebral artery dominant. Both PICA origins patent and normal. Basilar patent to its distal aspect without stenosis. Superior cerebellar arteries patent bilaterally. Both PCAs primarily supplied via the basilar well perfused or distal aspects. Anatomic variants: None significant.  No aneurysm. MRA NECK FINDINGS Aortic arch: Visualized aortic arch normal in caliber with normal 3 vessel morphology.  No hemodynamically significant stenosis seen about the origin of the great vessels. Right carotid system: Right CCA patent from its origin to the bifurcation without stenosis. Minor atheromatous irregularity about the right carotid bulb without stenosis. Right ICA widely patent distally without stenosis, evidence for dissection or occlusion. Left carotid system: Left CCA patent from its origin to the bifurcation without stenosis. Minor atheromatous irregularity about the left carotid bulb without stenosis. Left ICA patent distally without stenosis, evidence for dissection or occlusion. Vertebral arteries: Both vertebral arteries arise from the subclavian arteries. Left vertebral artery dominant. Vertebral arteries patent without stenosis, evidence for dissection or occlusion. Other: None IMPRESSION: MRI HEAD: 1. 1 cm acute ischemic nonhemorrhagic infarct involving the deep white matter of the posterior left frontal centrum semi ovale. 2. Underlying age-related cerebral atrophy with moderate chronic microvascular ischemic disease, with a few scattered remote lacunar infarcts involving the hemispheric cerebral white matter, basal ganglia, right thalamus, and right cerebellum. MRA HEAD: Normal intracranial MRA. No large vessel occlusion, hemodynamically significant stenosis, or other acute vascular abnormality. MRA NECK: Negative MRA of the neck. No hemodynamically significant or critical flow limiting stenosis. Electronically Signed   By: Jeannine Boga M.D.   On: 11/25/2020 00:12    PHYSICAL EXAM: Physical Exam  Constitutional: Appears well-developed and well-nourished.  Psych: Affect appropriate to situation Eyes: Normal external eye and conjunctiva. HENT: Normocephalic, no lesions, without obvious abnormality.   Musculoskeletal-no joint tenderness, deformity or swelling Cardiovascular: Normal rate and regular rhythm.  Respiratory: Effort normal, non-labored breathing saturations WNL GI: Soft.  No  distension. There is no tenderness.  Skin: WDI  Neurological Examination Mental Status: Alert, oriented, thought content appropriate.  Speech fluent without evidence of aphasia.  Able to follow 3 step commands without difficulty. Cranial Nerves: II:  Visual fields grossly normal,  III,IV, VI: ptosis not present, extra-ocular motions intact bilaterally, pupils equal, round, reactive to light and accommodation V,VII: smile symmetric, facial light touch sensation normal bilaterally VIII: hearing normal bilaterally; wear hearing aids IX,X: uvula rises midline XI: bilateral shoulder shrug XII: midline tongue extension Motor: Right : Upper extremity   5/5  Left:     Upper extremity   5/5  Lower extremity   5/5   Lower extremity   5/5 Tone and bulk:normal tone throughout;  no atrophy noted Sensory: light touch intact throughout, bilaterally Deep Tendon Reflexes: 2+ and symmetric biceps and patella Plantars: Right: downgoing   Left: downgoing Cerebellar: normal finger-to-nose, and normal heel-to-shin test Gait: deferred  ASSESSMENT/PLAN RENE SIZELOVE is a 87 y.o. 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   Yesterday evening around 1930, had trouble with holding the fork in his R hand. Attempted to go to the bathroom last night and trouble with R leg. Told his wife about it this morning and was brought in to the ED.   Symptoms have mostly resolved, still has some difficulty with balancing.   He is on eliquis and did not miss any doses.  Stroke:  right frontal centrum semi ovale infarct secondary to small vessel disease CT head No acute abnormality.  MRI  1. 1 cm acute ischemic nonhemorrhagic infarct involving the deep white matter of the posterior left frontal centrum semi ovale. MRA  Head: normal intracranial MRA head and Neck 2D Echo EF 55-60% LDL 66 HgbA1c 5.8 VTE  prophylaxis - eliquis    Diet   Diet Heart Room service appropriate? Yes; Fluid consistency: Thin   Eliquis (apixaban) daily prior to admission, now on Eliquis (apixaban) daily. Recommend to add ASA 81 on top of eliquis for further stroke prevention.  Therapy recommendations:  outpatient PT/ OT Disposition:  home with walker  Hypertension Home meds:   Stable Permissive hypertension (OK if < 220/120) but gradually normalize in 5-7 days Long-term BP goal normotensive  Hyperlipidemia Home meds:  lipitor 40 mg daily, resumed in hospital LDL 66, goal < 70 Continue statin at discharge  Other Stroke Risk Factors Advanced Age >/= 65  Afib on eliquis Obesity, There is no height or weight on file to calculate BMI., BMI >/= 30 associated with increased stroke risk, recommend weight loss, diet and exercise as appropriate  S/p AVR x 2    Hospital day # 0  Laurey Morale, MSN, NP-C [facility] [phone]    To contact Stroke Continuity provider, please refer to [email or web address] After hours, contact General Neurology

## 2020-11-26 ENCOUNTER — Telehealth: Payer: Self-pay | Admitting: Cardiology

## 2020-11-26 NOTE — Telephone Encounter (Signed)
Rec'd phone page about pt taking ASA, pt just discharged after cerebral thrombosis with CVA, he has hx of Valve in valve TAVR , PAF, CAD and now CVA.  Wife wanted to know if pt should be on ASA.  I do not see ASA on pre hospital meds or discharge meds.  I left a message that if he had not been on he should not begin.  He is on Eliquis.  Though on Neuro sign off Dr. Erlinda Hong did recommend ASA 81 in addition to Eliquis.  I will call back and have her check with neurology. Discussed with wife, and they will add the ASA 81.  I will send to Dr. Irish Lack as well.

## 2020-11-28 ENCOUNTER — Telehealth: Payer: Self-pay | Admitting: Interventional Cardiology

## 2020-11-28 NOTE — Telephone Encounter (Signed)
Will route to Dr. Irish Lack to make him aware

## 2020-11-28 NOTE — Telephone Encounter (Signed)
Pt c/o medication issue:  1. Name of Medication: aspirin 81 mg  2. How are you currently taking this medication (dosage and times per day)? 1 tablet daily  3. Are you having a reaction (difficulty breathing--STAT)? no  4. What is your medication issue? Saturday and Sunday, likely hood that he could have another stroke. She states it was a 1 cm ischemic infarction and that is has effected his entire right side. She states he does start physical therapy today. She states they want to make sure Dr. Irish Lack knows about the patient's stroke and the addition of aspirin to his medication regimen and agrees that he should be back on it.

## 2020-11-29 ENCOUNTER — Ambulatory Visit: Payer: Medicare Other | Attending: Internal Medicine | Admitting: Occupational Therapy

## 2020-11-29 ENCOUNTER — Encounter: Payer: Self-pay | Admitting: Occupational Therapy

## 2020-11-29 ENCOUNTER — Other Ambulatory Visit: Payer: Self-pay

## 2020-11-29 DIAGNOSIS — R2689 Other abnormalities of gait and mobility: Secondary | ICD-10-CM | POA: Insufficient documentation

## 2020-11-29 DIAGNOSIS — R278 Other lack of coordination: Secondary | ICD-10-CM | POA: Diagnosis not present

## 2020-11-29 DIAGNOSIS — R41842 Visuospatial deficit: Secondary | ICD-10-CM | POA: Insufficient documentation

## 2020-11-29 DIAGNOSIS — R262 Difficulty in walking, not elsewhere classified: Secondary | ICD-10-CM | POA: Diagnosis present

## 2020-11-29 DIAGNOSIS — I69319 Unspecified symptoms and signs involving cognitive functions following cerebral infarction: Secondary | ICD-10-CM | POA: Diagnosis present

## 2020-11-29 DIAGNOSIS — M6281 Muscle weakness (generalized): Secondary | ICD-10-CM | POA: Diagnosis present

## 2020-11-29 NOTE — Telephone Encounter (Signed)
Follow Up:   Wife is calling to see what was decided about the patient's Aspirin. She also would like  for Dr Irish Lack to do a handicap parking  permit  for the patient please.

## 2020-11-29 NOTE — Telephone Encounter (Signed)
Spoke with Lelon Frohlich the patient's wife. Advised to continue as planned with Cecilie Kicks NP about ASA 81. Patient wife asked about handicap parking permit and making follow up with Dr. Irish Lack. Advised the MD and RN are out of the office and will contact them next week when they return.  Ann in agreement and verbalized understanding.

## 2020-11-30 NOTE — Therapy (Signed)
[place] [place], [name], [address] Phone: [phone]   Fax:  [phone]  Occupational Therapy Evaluation  Patient Details  Name: JUSTEN FONDA MRN: [id number] Date of Birth: 22-Apr-1930 Referring Provider (OT): Debbe Odea, MD   Encounter Date: 11/29/2020   OT End of Session - 11/29/20 1649     Visit Number 1    Number of Visits 13    Date for OT Re-Evaluation 02/27/21    Authorization Type [employer] ($10 copay/day)    Authorization Time Period VL: [place]    Progress Note Due on Visit 10    OT Start Time 1020   pt arrival time   OT Stop Time 1107    OT Time Calculation (min) 47 min    Behavior During Therapy [facility] for tasks assessed/performed             Past Medical History:  Diagnosis Date   Atrial flutter, paroxysmal ([place])    a. dx 04-02-2014, s/p  successful cardioversion 04-06-2014.   Chronic anticoagulation    on Eliquis--  due to recurrent dvt's and pe's   Dyslipidemia    History of bladder cancer urologist-  dr [name]   02-13-2016  s/p TURBT per path high grade papillary urothelial carcinoma   History of DVT (deep vein thrombosis)    2000-- RLE   History of melanoma excision    left flank;  07/ 2014 left lower leg and right upper chest   History of prostate cancer    Gleason 6--  s/p  radical prostatectomy 06/ 2000 in [place]---  no recurrence   History of pulmonary embolus (PE)    2000 & 2005  bilateral   Hx of valvuloplasty 06/19/2007   a. 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 05/2007 CABG x 1: s/p SVG-OM;  b. 10/2010 Ex MV: EF 72%, inf attenuation w/o ischemia, brief run of PAT with exercise. To   Thrombocytopenia  ([place])    Thyroid goiter 2013   nodular   Wears hearing aid    BILATERAL   Wears partial dentures    UPPER    Past Surgical History:  Procedure Laterality Date   AORTIC VALVE REPLACEMENT (AVR)/CORONARY ARTERY BYPASS GRAFTING (CABG)  06-19-2007  dr [name]   SVG to OM1 ;   AVR w/ #23 Mitralflow pericardial and ligation left atrial appendage;  MV repair w/ #28 Sorin 3-D memo Ring Annuloplasty with repair ruptured chordar of P-2 flail segments   CARDIAC CATHETERIZATION  05-12-2007  dr Leonia Reeves   single vessel 30% ostial LAD,  80% LCx;  severe to critial AS,  moderate MR,  normal LVF, ef 70%,  normal right heart pressures and cardiac outputs,  mild elevated LV end-diastolic pressure     CARDIOVASCULAR STRESS TEST  11-02-2010  dr Irish Lack   normal nuclear study w/ no ischemia or infarct/scar/  normal LV function and wall motion , ef 72%   CARDIOVERSION N/A 04/06/2014   Procedure: CARDIOVERSION;  Surgeon: [name], MD;  Location: [place];  Service: Cardiovascular;  Laterality: N/A;   successful   CATARACT EXTRACTION W/ INTRAOCULAR LENS  IMPLANT, BILATERAL  2012  approx   CORONARY ARTERY  BYPASS GRAFT     RETROPUBIC RADICAL PROSTATECTOMY  06/ 2000  in [place], [place]   RIGHT/LEFT HEART CATH AND CORONARY/GRAFT ANGIOGRAPHY N/A 10/29/2018   Procedure: RIGHT/LEFT HEART CATH AND CORONARY/GRAFT ANGIOGRAPHY;  Surgeon: [name], MD;  Location: [place] CV LAB;  Service: Cardiovascular;  Laterality: N/A;   ROTATOR CUFF REPAIR Left 09/1998   TEE WITHOUT CARDIOVERSION N/A 04/06/2014   Procedure: TRANSESOPHAGEAL ECHOCARDIOGRAM (TEE);  Surgeon: [name], MD;  Location: [facility] ENDOSCOPY;  Service: Cardiovascular;  Laterality: N/A;  mild focal basa LVH of the septum, ef 50-55%, s/p AV annuloplasty ring, elongated chordae, thickened leaflets, mild to mod. MR, multiple small jets/arotic bioprosthetic valve sits well in position, mild AI/ thickened TV w/ mild reurg./mild LA   TEE WITHOUT  CARDIOVERSION N/A 10/22/2018   Procedure: TRANSESOPHAGEAL ECHOCARDIOGRAM (TEE);  Surgeon: [name], MD;  Location: [facility] ENDOSCOPY;  Service: Cardiovascular;  Laterality: N/A;   TEE WITHOUT CARDIOVERSION N/A 11/25/2018   Procedure: TRANSESOPHAGEAL ECHOCARDIOGRAM (TEE);  Surgeon: [name], MD;  Location: [place];  Service: Open Heart Surgery;  Laterality: N/A;   TRANSCATHETER AORTIC VALVE REPLACEMENT, TRANSFEMORAL N/A 11/25/2018   Procedure: TRANSCATHETER AORTIC VALVE REPLACEMENT, TRANSFEMORAL;  Surgeon: [name], MD;  Location: [place];  Service: Open Heart Surgery;  Laterality: N/A;   TRANSTHORACIC ECHOCARDIOGRAM  03-23-2016   dr Irish Lack   EF 55-60%, reduced contribution atrial contraction to ventricular filling, due to increased ventricular diastolic pressure or atrial contratile dysfunction/  bioprosthetic AV w/ mod. regurg. (valve area 1.97cm^2)/ mild dilated ascending aorta/ mild thicken MV normal function annular ring prosthesis/severe LAE & mod. to sev.RAE/ PASP 54mmHg/ mild TR/ ventricle septal motion show paradox   TRANSURETHRAL RESECTION OF BLADDER TUMOR N/A 02/13/2016   Procedure: TRANSURETHRAL RESECTION OF BLADDER TUMOR (TURBT);  Surgeon: Nickie Retort, MD;  Location[facility];  Service: Urology;  Laterality: N/A;   TRANSURETHRAL RESECTION OF BLADDER TUMOR N/A 09/04/2016   Procedure: TRANSURETHRAL RESECTION OF BLADDER TUMOR (TURBT);  Surgeon: Nickie Retort, MD;  Location: [facility];  Service: Urology;  Laterality: N/A;    There were no vitals filed for this visit.   Subjective Assessment - 11/29/20 1029     Subjective  Pt arrives to session w/ primary concerns of R-sided weakness s/p Lt frontal centrum semi ovale infarct secondary to small vessel disease. Pt reports he initially noticed symptoms, primarily difficulty using a fork in R, dominand hand, last Wednesday (11/23/20). He went to sleep thinking his symptoms would resolve, but woke  up in the night w/ add'l LLE weakness and presented to the ED on Thursday morning (11/24/20).    Pertinent History Lt frontal centrum semi ovale infarct secondary to small vessel disease (onset 11/23/20), DVT/PE, paroxysmal a-fib on Eliquis, HLD, aortic valve replacement (x2) w/ bioprosthetic cardiac valve, mitral ring annuloplasty, TAVR, CAD    Currently in Pain? No/denies             [facility] OT Assessment - 11/29/20 1030       Assessment   Medical Diagnosis Lt frontal centrum semi ovale infarct 2/2 small vessel disease    Referring Provider (OT) Debbe Odea, MD    Onset Date/Surgical Date 11/23/20    Hand Dominance Right    Next MD Visit 01/03/21    Prior Therapy Yes      Precautions   Precautions Fall    Precaution Comments "Rest"   Per spousal report     Balance Screen   Has  the patient fallen in the past 6 months Yes    Has the patient had a decrease in activity level because of a fear of falling?  No    Is the patient reluctant to leave their home because of a fear of falling?  No      Home  Environment   Type of Home House    Entrance Stairs-Rails Can reach both    Entrance Stairs-Number of Steps 3    Home Layout Two level     Alternate Level Stairs-Rails Can reach both   Wall on one side, railing on opposite side; alternates sides at midway point   Alternate Level Stairs - Number of Steps ~19    Bathroom Shower/Tub Hospital doctor - 2 wheels;Cane - single point;Bedside commode;Shower seat - built in    Lives With Spouse      Prior Function   Level of Independence Independent with basic ADLs    Vocation Retired    Armed forces technical officer    Leisure Follows specialty/antique cars      ADL   ADL comments Reports Mod I w/ BADLs at this time      IADL   Meal Prep Able to complete simple cold meal and snack prep    Community Mobility Relies on family or friends for transportation    Medication Management Takes  responsibility if medication is prepared in advance in seperate dosage;Has difficulty remembering to take medication      Written Expression   Handwriting Severe micrographia;Increased time   Per pt report     Vision Assessment   Eye Alignment Within Functional Limits    Ocular Range of Motion Within Functional Limits    Tracking/Visual Pursuits Decreased smoothness of horizontal tracking    Saccades Decreased speed of saccadic movement    Convergence Impaired - to be further tested in functional context    Visual Fields No apparent deficits      Activity Tolerance   Activity Tolerance Comments Pt reports decreased endurance; To be further assessed in functional context      Cognition   Overall Cognitive Status Impaired/Different from baseline    Area of Impairment Memory;Problem solving;Awareness   Visuospatial   Memory Comments Decreased short-term and working memory; Limitations w/ delayed recall    Problem Solving Slow processing;Difficulty sequencing    Executive Function Organizing;Sequencing;Self Monitoring;Self Correcting      Sensation   Additional Comments Appears intact; per pt report      Coordination   Gross Motor Movements are Fluid and Coordinated No    Fine Motor Movements are Fluid and Coordinated No    9 Hole Peg Test Right;Left    Right 9 Hole Peg Test 48 sec    Left 9 Hole Peg Test 36 sec    Tremors Observed tremor during tasks w/ bilateral hands      ROM / Strength   AROM / PROM / Strength Strength      Strength   Overall Strength Unable to assess;Due to precautions             OT Education - 11/29/20 1646     Education Details Education provided on role and purpose of OT, as well as potential interventions and goals for therapy. Reviewed BEFAST acronym and importance of seeking medical care w/ any new/worsening symtpoms or falls; provided add'l condition-specific education related to pt and spouse questions.    Person(s)  Educated Patient;Spouse    Wife Lelon Frohlich)   Methods Explanation;Handout    Comprehension Verbalized understanding             OT Short Term Goals - 11/29/20 1230       OT SHORT TERM GOAL #1   Title Pt will verbalize understanding of memory compensatory strategies to improve safety at home    Baseline Recent limitations w/ memory    Time 3    Period Weeks    Status New    Target Date 12/23/20      OT SHORT TERM GOAL #2   Title Pt will identify at least 7/10 targets during environmental scanning activity w/out assist to improve safety w/ functional mobility    Time 3    Period Weeks    Status New      OT SHORT TERM GOAL #3   Title Pt will improve coordination as evidenced by decreasing 9-HPT time by at least 5 sec w/ R, dominant hand    Baseline R hand 48 sec; L hand 36 sec    Time 3    Period Weeks    Status New      OT SHORT TERM GOAL #4   Title Pt will demonstrate understanding of compensatory strategies, including use of AE, to implement during BADLs/IADLs to increase safety and independence prn    Baseline Decreased knowledge of compensatory strategies    Time 3    Period Weeks    Status New             OT Long Term Goals - 11/30/20 1231       OT LONG TERM GOAL #1   Title Pt will be independent w/ HEP designed for [facility] and coordination by discharge    Baseline No HEP at this time    Time 6    Period Weeks    Status New    Target Date 01/13/21      OT LONG TERM GOAL #2   Title Pt will improve coordination as evidenced by decreasing 9-HPT time by at least 9 sec w/ R, dominant hand    Baseline R hand 48 sec; L hand 36 sec    Time 6    Period Weeks    Status New      OT LONG TERM GOAL #3   Title Pt will write a sentence w/ at least 75% legibility and sizing in pt-determined acceptable level of time, using AE prn    Baseline Pt reports micrographia and increased time    Time 6    Period Weeks    Status New      OT LONG TERM GOAL #4   Title Pt will be able to sequence through an  IADL task (e.g., med mgmt, housekeeping) w/ Mod I    Baseline Decreased organization of thought and sequencing    Time 6    Period Weeks    Status New             Plan - 11/29/20 1652     Clinical Impression Statement Pt is a 87 y.o. male who presents to OP OT due to Lt frontal centrum semi ovale infarct 2/2 small vessel disease that occurred on 11/23/20. Pt is retired and currently lives with his wife in a multi-level home. PMHx includes DVT/PE, paroxysmal a-fib on Eliquis, HLD, aortic valve replacement (x2) w/ bioprosthetic cardiac valve, mitral ring annuloplasty, TAVR, CAD. Pt will benefit from skilled occupational therapy services to address  strength and coordination, ROM, GM/FM control, executive functioning, safety awareness, compensatory strategies including AE prn, visual-perception, and implementation of an HEP to improve participation and safety during ADLs and IADLs.    OT Occupational Profile and History Detailed Assessment- Review of Records and additional review of physical, cognitive, psychosocial history related to current functional performance    Occupational performance deficits (Please refer to evaluation for details): ADL's;IADL's;Leisure;Social Participation    Body Structure / Function / Physical Skills ADL;Decreased knowledge of use of DME;Gait;Strength;Balance;Dexterity;GMC;UE functional use;Body mechanics;Hearing;Cardiopulmonary status limiting activity;Endurance;IADL;ROM;Vision;Coordination;Mobility;Sensation;FMC;Decreased knowledge of precautions    Cognitive Skills Attention;Memory;Problem Solve;Safety Awareness;Sequencing    Psychosocial Skills Environmental  Adaptations    Rehab Potential Good    Clinical Decision Making Several treatment options, min-mod task modification necessary    Comorbidities Affecting Occupational Performance: Presence of comorbidities impacting occupational performance    Modification or Assistance to Complete Evaluation  Min-Moderate  modification of tasks or assist with assess necessary to complete eval    OT Frequency 2x / week   May reduce frequency to 1x/week depending on progress   OT Duration 6 weeks    OT Treatment/Interventions Aquatic Therapy;Self-care/ADL training;DME and/or AE instruction;Balance training;Therapeutic activities;Cognitive remediation/compensation;Therapeutic exercise;Neuromuscular education;Functional Mobility Training;Visual/perceptual remediation/compensation;Patient/family education;Manual Therapy;Energy conservation;Electrical Stimulation    Plan Review goals w/ pt    Consulted and Agree with Plan of Care Patient;Family member/caregiver    Family Member Consulted Lelon Frohlich (wife)             Patient will benefit from skilled therapeutic intervention in order to improve the following deficits and impairments:   Body Structure / Function / Physical Skills: ADL, Decreased knowledge of use of DME, Gait, Strength, Balance, Dexterity, GMC, UE functional use, Body mechanics, Hearing, Cardiopulmonary status limiting activity, Endurance, IADL, ROM, Vision, Coordination, Mobility, Sensation, FMC, Decreased knowledge of precautions Cognitive Skills: Attention, Memory, Problem Solve, Safety Awareness, Sequencing Psychosocial Skills: Environmental  Adaptations   Visit Diagnosis: Other lack of coordination  Unspecified symptoms and signs involving cognitive functions following cerebral infarction  Visuospatial deficit  Muscle weakness (generalized)    Problem List Patient Active Problem List   Diagnosis Date Noted   Cerebral thrombosis with cerebral infarction 11/25/2020   Acute right-sided weakness 11/24/2020   History of mitral valve repair 11/26/2018   Prosthetic valve dysfunction 11/25/2018   Ectatic abdominal aorta ([place]) 11/25/2018   S/P valve-in-valve TAVR 11/25/2018   History of bladder cancer    History of pulmonary embolus (PE)    Acute on chronic diastolic heart failure ([place])  04/23/2018   History of aortic valve replacement 04/23/2018   Atrial fibrillation ([place]) 04/23/2018   Coronary atherosclerosis of native coronary artery 02/25/2014   Hyperlipidemia 02/25/2014   Embolism and thrombosis ([place]) 03/11/2013     Kathrine Cords, OTR/L, MSOT  11/30/2020, 3:13 PM  [address]. [place], [name], [address] Phone: [phone]   Fax:  [phone]  Name: SHARIF RENDELL MRN: [id number] Date of Birth: [birth year]

## 2020-12-07 ENCOUNTER — Ambulatory Visit: Payer: Medicare Other | Admitting: Occupational Therapy

## 2020-12-07 ENCOUNTER — Encounter: Payer: Self-pay | Admitting: Occupational Therapy

## 2020-12-07 ENCOUNTER — Encounter: Payer: Self-pay | Admitting: Physical Therapy

## 2020-12-07 ENCOUNTER — Other Ambulatory Visit: Payer: Self-pay

## 2020-12-07 ENCOUNTER — Ambulatory Visit: Payer: Medicare Other | Admitting: Physical Therapy

## 2020-12-07 DIAGNOSIS — R278 Other lack of coordination: Secondary | ICD-10-CM | POA: Diagnosis not present

## 2020-12-07 DIAGNOSIS — R2689 Other abnormalities of gait and mobility: Secondary | ICD-10-CM

## 2020-12-07 DIAGNOSIS — I69319 Unspecified symptoms and signs involving cognitive functions following cerebral infarction: Secondary | ICD-10-CM

## 2020-12-07 DIAGNOSIS — M6281 Muscle weakness (generalized): Secondary | ICD-10-CM

## 2020-12-07 DIAGNOSIS — R41842 Visuospatial deficit: Secondary | ICD-10-CM

## 2020-12-07 DIAGNOSIS — R262 Difficulty in walking, not elsewhere classified: Secondary | ICD-10-CM

## 2020-12-07 NOTE — Therapy (Signed)
[place]. [place], [name], [address] Phone: [phone]   Fax:  [phone]  Occupational Therapy Treatment  Patient Details  Name: Eric [name] MRN: [id number] Date of Birth: [birth year] Referring Provider (OT): Debbe Odea, MD   Encounter Date: 12/07/2020   OT End of Session - 12/07/20 1536     Visit Number 2    Number of Visits 13    Date for OT Re-Evaluation 02/27/21    Authorization Type [employer] ($10 copay/day)    Authorization Time Period VL: [place]    Progress Note Due on Visit 10    OT Start Time 1533    OT Stop Time 2458    OT Time Calculation (min) 42 min    Behavior During Therapy [facility] for tasks assessed/performed             Past Medical History:  Diagnosis Date   Atrial flutter, paroxysmal ([place])    a. dx 04-02-2014, s/p  successful cardioversion 04-06-2014.   Chronic anticoagulation    on Eliquis--  due to recurrent dvt's and pe's   Dyslipidemia    History of bladder cancer urologist-  dr [name]   02-13-2016  s/p TURBT per path high grade papillary urothelial carcinoma   History of DVT (deep vein thrombosis)    2000-- RLE   History of melanoma excision    left flank;  07/ 2014 left lower leg and right upper chest   History of prostate cancer    Gleason 6--  s/p  radical prostatectomy 06/ 2000 in [place]---  no recurrence   History of pulmonary embolus (PE)    2000 & 2005  bilateral   Hx of valvuloplasty 06/19/2007   a. 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 05/2007 CABG x 1: s/p SVG-OM;  b. 10/2010 Ex MV: EF 72%, inf attenuation w/o ischemia, brief run of PAT with exercise. To   Thrombocytopenia ([place])    Thyroid  goiter 2013   nodular   Wears hearing aid    BILATERAL   Wears partial dentures    UPPER    Past Surgical History:  Procedure Laterality Date   AORTIC VALVE REPLACEMENT (AVR)/CORONARY ARTERY BYPASS GRAFTING (CABG)  06-19-2007  dr [name]   SVG to OM1 ;   AVR w/ #23 Mitralflow pericardial and ligation left atrial appendage;  MV repair w/ #28 Sorin 3-D memo Ring Annuloplasty with repair ruptured chordar of P-2 flail segments   CARDIAC CATHETERIZATION  05-12-2007  dr Leonia Reeves   single vessel 30% ostial LAD,  80% LCx;  severe to critial AS,  moderate MR,  normal LVF, ef 70%,  normal right heart pressures and cardiac outputs,  mild elevated LV end-diastolic pressure     CARDIOVASCULAR STRESS TEST  11-02-2010  dr Irish Lack   normal nuclear study w/ no ischemia or infarct/scar/  normal LV function and wall motion , ef 72%   CARDIOVERSION N/A 04/06/2014   Procedure: CARDIOVERSION;  Surgeon: [name], MD;  Location: [facility];  Service: Cardiovascular;  Laterality: N/A;   successful   CATARACT EXTRACTION W/ INTRAOCULAR LENS  IMPLANT, BILATERAL  2012  approx   CORONARY ARTERY BYPASS GRAFT  RETROPUBIC RADICAL PROSTATECTOMY  06/ 2000  in [place], [place]   RIGHT/LEFT HEART CATH AND CORONARY/GRAFT ANGIOGRAPHY N/A 10/29/2018   Procedure: RIGHT/LEFT HEART CATH AND CORONARY/GRAFT ANGIOGRAPHY;  Surgeon: [name], MD;  Location: [facility] CV LAB;  Service: Cardiovascular;  Laterality: N/A;   ROTATOR CUFF REPAIR Left 09/1998   TEE WITHOUT CARDIOVERSION N/A 04/06/2014   Procedure: TRANSESOPHAGEAL ECHOCARDIOGRAM (TEE);  Surgeon: [name], MD;  Location: [facility] ENDOSCOPY;  Service: Cardiovascular;  Laterality: N/A;  mild focal basa LVH of the septum, ef 50-55%, s/p AV annuloplasty ring, elongated chordae, thickened leaflets, mild to mod. MR, multiple small jets/arotic bioprosthetic valve sits well in position, mild AI/ thickened TV w/ mild reurg./mild LA   TEE WITHOUT CARDIOVERSION N/A  10/22/2018   Procedure: TRANSESOPHAGEAL ECHOCARDIOGRAM (TEE);  Surgeon: [name], MD;  Location: [facility] ENDOSCOPY;  Service: Cardiovascular;  Laterality: N/A;   TEE WITHOUT CARDIOVERSION N/A 11/25/2018   Procedure: TRANSESOPHAGEAL ECHOCARDIOGRAM (TEE);  Surgeon: [name], MD;  Location: [facility];  Service: Open Heart Surgery;  Laterality: N/A;   TRANSCATHETER AORTIC VALVE REPLACEMENT, TRANSFEMORAL N/A 11/25/2018   Procedure: TRANSCATHETER AORTIC VALVE REPLACEMENT, TRANSFEMORAL;  Surgeon: [name], MD;  Location: [facility];  Service: Open Heart Surgery;  Laterality: N/A;   TRANSTHORACIC ECHOCARDIOGRAM  03-23-2016   dr Irish Lack   EF 55-60%, reduced contribution atrial contraction to ventricular filling, due to increased ventricular diastolic pressure or atrial contratile dysfunction/  bioprosthetic AV w/ mod. regurg. (valve area 1.97cm^2)/ mild dilated ascending aorta/ mild thicken MV normal function annular ring prosthesis/severe LAE & mod. to sev.RAE/ PASP 27mmHg/ mild TR/ ventricle septal motion show paradox   TRANSURETHRAL RESECTION OF BLADDER TUMOR N/A 02/13/2016   Procedure: TRANSURETHRAL RESECTION OF BLADDER TUMOR (TURBT);  Surgeon: Nickie Retort, MD;  Location: [facility];  Service: Urology;  Laterality: N/A;   TRANSURETHRAL RESECTION OF BLADDER TUMOR N/A 09/04/2016   Procedure: TRANSURETHRAL RESECTION OF BLADDER TUMOR (TURBT);  Surgeon: Nickie Retort, MD;  Location: [facility];  Service: Urology;  Laterality: N/A;    There were no vitals filed for this visit.   Subjective Assessment - 12/07/20 1535     Subjective  "I think this was a good session"    Pertinent History Lt frontal centrum semiovale infarct 2/2 to small vessel disease (onset 11/23/20), DVT/PE, paroxysmal a-fib on Eliquis, aortic valve replacement (x2) w/ bioprosthetic cardiac valve, mitral ring annuloplasty, TAVR, CAD, Lt rotator cuff repair sx (May 2000), HLD    Currently in Pain?  No/denies             OT Treatment/Exercises - 12/07/20    Large Pegboard Copying pattern onto large pegboard; pt able to complete activity Ind. Activity facilitated eye-hand coordination, [place], coordination, and hand strengthening  Picking up marbles, 2 at a time and translating from palm to fingertips to place on easy-grip pegs. OT graded activity up from 2 marbles at a time to 3-4 at a time w/ pt demo'ing continued success. Completed activity w/ 3 drops; increased success with decreased speed. Activity facilitated eye-hand coordination, [place], in-hand manipulation, and coordination    Coordination Activities [facility] and coordination activities including in-hand rotation of small ball, tossing ball from one hand to the other, and tossing/catching ball ipsilaterally w/ R hand; flipping cards off a deck and rotating a card clockwise and end-over-end; as well as picking up coins 5-10 at a time and translating to fingertips to place in stacks    Handwriting Attempted signature  w/ standard pen, pen w/ built-up handle, and weighted pen. Pt demo'd and reported most success w/ weighted utensil            OT Education - 12/07/20 1535     Education Details Reivewed goals w/ pt. Introduced joint protection strategies and HEP including coordination activities.    Person(s) Educated Patient;Spouse   Wife Lelon Frohlich)   Methods Explanation;Demonstration;Handout    Comprehension Verbalized understanding;Returned demonstration             OT Short Term Goals - 12/07/20 1536       OT SHORT TERM GOAL #1   Title Pt will verbalize understanding of memory compensatory strategies to improve safety at home    Baseline Recent limitations w/ memory    Time 3    Period Weeks    Status On-going    Target Date 12/23/20      OT SHORT TERM GOAL #2   Title Pt will identify at least 7/10 targets during environmental scanning activity w/out assist to improve safety w/ functional mobility    Time 3    Period Weeks     Status On-going      OT SHORT TERM GOAL #3   Title Pt will improve coordination as evidenced by decreasing 9-HPT time by at least 5 sec w/ R, dominant hand    Baseline R hand 48 sec; L hand 36 sec    Time 3    Period Weeks    Status On-going      OT SHORT TERM GOAL #4   Title Pt will demonstrate understanding of compensatory strategies, including use of AE, to implement during BADLs/IADLs to increase safety and independence prn    Baseline Decreased knowledge of compensatory strategies    Time 3    Period Weeks    Status On-going             OT Long Term Goals - 12/07/20 1537       OT LONG TERM GOAL #1   Title Pt will be independent w/ HEP designed for [facility] and coordination by discharge    Baseline No HEP at this time    Time 6    Period Weeks    Status On-going      OT LONG TERM GOAL #2   Title Pt will improve coordination as evidenced by decreasing 9-HPT time by at least 9 sec w/ R, dominant hand    Baseline R hand 48 sec; L hand 36 sec    Time 6    Period Weeks    Status On-going      OT LONG TERM GOAL #3   Title Pt will write a sentence w/ at least 75% legibility and sizing in pt-determined acceptable level of time, using AE prn    Baseline Pt reports micrographia and increased time; pt keeps a written blog    Time 6    Period Weeks    Status On-going      OT LONG TERM GOAL #4   Title Pt will be able to sequence through an IADL task (e.g., med mgmt, housekeeping) w/ Mod I    Baseline Decreased organization of thought and sequencing    Time 6    Period Weeks    Status On-going             Plan - 12/07/20 1536     Clinical Impression Statement Pt is currently 2 weeks s/p cerebral infarction. Due to pt exhibiting min difficulty  w/ [facility] and coordination tasks during pegboard/marble activity this session, OT initiated coordination HEP w/ pt returning demo of each exercise. OT also discussed joint protection strategies due to pt reporting increased Rt  shoulder symptoms prior to recent cerebral infarction in addition to current R-sided weakness.    OT Occupational Profile and History Detailed Assessment- Review of Records and additional review of physical, cognitive, psychosocial history related to current functional performance    Occupational performance deficits (Please refer to evaluation for details): ADL's;IADL's;Leisure;Social Participation    Body Structure / Function / Physical Skills ADL;Decreased knowledge of use of DME;Gait;Strength;Balance;Dexterity;GMC;UE functional use;Body mechanics;Hearing;Cardiopulmonary status limiting activity;Endurance;IADL;ROM;Vision;Coordination;Mobility;Sensation;FMC;Decreased knowledge of precautions    Cognitive Skills Attention;Memory;Problem Solve;Safety Awareness;Sequencing    Psychosocial Skills Environmental  Adaptations    Rehab Potential Good    Clinical Decision Making Several treatment options, min-mod task modification necessary    Comorbidities Affecting Occupational Performance: Presence of comorbidities impacting occupational performance    Modification or Assistance to Complete Evaluation  Min-Moderate modification of tasks or assist with assess necessary to complete eval    OT Frequency 2x / week   May reduce frequency to 1x/week depending on progress   OT Duration 6 weeks    OT Treatment/Interventions Aquatic Therapy;Self-care/ADL training;DME and/or AE instruction;Balance training;Therapeutic activities;Cognitive remediation/compensation;Therapeutic exercise;Neuromuscular education;Functional Mobility Training;Visual/perceptual remediation/compensation;Patient/family education;Manual Therapy;Energy conservation;Electrical Stimulation    Plan Handwriting w/ adaptive utensils; review joint projection strategies (administered 7/20) and memory compensatory strategies    Consulted and Agree with Plan of Care Patient;Family member/caregiver    Family Member Consulted Lelon Frohlich (wife)              Patient will benefit from skilled therapeutic intervention in order to improve the following deficits and impairments:   Body Structure / Function / Physical Skills: ADL, Decreased knowledge of use of DME, Gait, Strength, Balance, Dexterity, GMC, UE functional use, Body mechanics, Hearing, Cardiopulmonary status limiting activity, Endurance, IADL, ROM, Vision, Coordination, Mobility, Sensation, FMC, Decreased knowledge of precautions Cognitive Skills: Attention, Memory, Problem Solve, Safety Awareness, Sequencing Psychosocial Skills: Environmental  Adaptations   Visit Diagnosis: Other lack of coordination  Visuospatial deficit  Unspecified symptoms and signs involving cognitive functions following cerebral infarction  Muscle weakness (generalized)    Problem List Patient Active Problem List   Diagnosis Date Noted   Cerebral thrombosis with cerebral infarction 11/25/2020   Acute right-sided weakness 11/24/2020   History of mitral valve repair 11/26/2018   Prosthetic valve dysfunction 11/25/2018   Ectatic abdominal aorta ([place]) 11/25/2018   S/P valve-in-valve TAVR 11/25/2018   History of bladder cancer    History of pulmonary embolus (PE)    Acute on chronic diastolic heart failure ([place]) 04/23/2018   History of aortic valve replacement 04/23/2018   Atrial fibrillation ([place]) 04/23/2018   Coronary atherosclerosis of native coronary artery 02/25/2014   Hyperlipidemia 02/25/2014   Embolism and thrombosis ([place]) 03/11/2013     Kathrine Cords, OTR/L, MSOT  12/07/2020, 3:39 PM  [place]. [place], [name], [address] Phone: [phone]   Fax:  [phone]  Name: RANGEL ECHEVERRI MRN: [id number] Date of Birth: 13-May-1930

## 2020-12-07 NOTE — Therapy (Signed)
[place] [place], [name], [address] Phone: [phone]   Fax:  [phone]  Physical Therapy Evaluation  Patient Details  Name: Eric [name] MRN: [id number] Date of Birth: 12-06-1929 Referring Provider (PT): Debbe Odea   Encounter Date: 12/07/2020   PT End of Session - 12/07/20 1656     Visit Number 1    Date for PT Re-Evaluation 03/09/21    Authorization Type [employer] Medicare    PT Start Time 1615    PT Stop Time 1657    PT Time Calculation (min) 42 min    Activity Tolerance Patient tolerated treatment well    Behavior During Therapy [facility] for tasks assessed/performed             Past Medical History:  Diagnosis Date   Atrial flutter, paroxysmal ([place])    a. dx 04-02-2014, s/p  successful cardioversion 04-06-2014.   Chronic anticoagulation    on Eliquis--  due to recurrent dvt's and pe's   Dyslipidemia    History of bladder cancer urologist-  dr [name]   02-13-2016  s/p TURBT per path high grade papillary urothelial carcinoma   History of DVT (deep vein thrombosis)    2000-- RLE   History of melanoma excision    left flank;  07/ 2014 left lower leg and right upper chest   History of prostate cancer    Gleason 6--  s/p  radical prostatectomy 06/ 2000 in [place]---  no recurrence   History of pulmonary embolus (PE)    2000 & 2005  bilateral   Hx of valvuloplasty 06/19/2007   a. 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 05/2007 CABG x 1: s/p SVG-OM;  b. 10/2010 Ex MV: EF 72%, inf attenuation w/o ischemia, brief run of PAT with exercise. To   Thrombocytopenia ([place])    Thyroid goiter 2013   nodular   Wears hearing aid    BILATERAL   Wears partial  dentures    UPPER    Past Surgical History:  Procedure Laterality Date   AORTIC VALVE REPLACEMENT (AVR)/CORONARY ARTERY BYPASS GRAFTING (CABG)  06-19-2007  dr [name]   SVG to OM1 ;   AVR w/ #23 Mitralflow pericardial and ligation left atrial appendage;  MV repair w/ #28 Sorin 3-D memo Ring Annuloplasty with repair ruptured chordar of P-2 flail segments   CARDIAC CATHETERIZATION  05-12-2007  dr Leonia Reeves   single vessel 30% ostial LAD,  80% LCx;  severe to critial AS,  moderate MR,  normal LVF, ef 70%,  normal right heart pressures and cardiac outputs,  mild elevated LV end-diastolic pressure     CARDIOVASCULAR STRESS TEST  11-02-2010  dr Irish Lack   normal nuclear study w/ no ischemia or infarct/scar/  normal LV function and wall motion , ef 72%   CARDIOVERSION N/A 04/06/2014   Procedure: CARDIOVERSION;  Surgeon: [name], MD;  Location: [facility];  Service: Cardiovascular;  Laterality: N/A;   successful   CATARACT EXTRACTION W/ INTRAOCULAR LENS  IMPLANT, BILATERAL  2012  approx   CORONARY ARTERY BYPASS GRAFT     RETROPUBIC RADICAL PROSTATECTOMY  06/ 2000  in [place]   RIGHT/LEFT HEART CATH AND CORONARY/GRAFT  ANGIOGRAPHY N/A 10/29/2018   Procedure: RIGHT/LEFT HEART CATH AND CORONARY/GRAFT ANGIOGRAPHY;  Surgeon: [name], MD;  Location: [facility];  Service: Cardiovascular;  Laterality: N/A;   ROTATOR CUFF REPAIR Left 09/1998   TEE WITHOUT CARDIOVERSION N/A 04/06/2014   Procedure: TRANSESOPHAGEAL ECHOCARDIOGRAM (TEE);  Surgeon: [name], MD;  Location: [facility] ENDOSCOPY;  Service: Cardiovascular;  Laterality: N/A;  mild focal basa LVH of the septum, ef 50-55%, s/p AV annuloplasty ring, elongated chordae, thickened leaflets, mild to mod. MR, multiple small jets/arotic bioprosthetic valve sits well in position, mild AI/ thickened TV w/ mild reurg./mild LA   TEE WITHOUT CARDIOVERSION N/A 10/22/2018   Procedure: TRANSESOPHAGEAL ECHOCARDIOGRAM (TEE);  Surgeon: [name], MD;  Location: [facility] ENDOSCOPY;  Service: Cardiovascular;  Laterality: N/A;   TEE WITHOUT CARDIOVERSION N/A 11/25/2018   Procedure: TRANSESOPHAGEAL ECHOCARDIOGRAM (TEE);  Surgeon: [name], MD;  Location: [facility];  Service: Open Heart Surgery;  Laterality: N/A;   TRANSCATHETER AORTIC VALVE REPLACEMENT, TRANSFEMORAL N/A 11/25/2018   Procedure: TRANSCATHETER AORTIC VALVE REPLACEMENT, TRANSFEMORAL;  Surgeon: [name], MD;  Location: [facility];  Service: Open Heart Surgery;  Laterality: N/A;   TRANSTHORACIC ECHOCARDIOGRAM  03-23-2016   dr Irish Lack   EF 55-60%, reduced contribution atrial contraction to ventricular filling, due to increased ventricular diastolic pressure or atrial contratile dysfunction/  bioprosthetic AV w/ mod. regurg. (valve area 1.97cm^2)/ mild dilated ascending aorta/ mild thicken MV normal function annular ring prosthesis/severe LAE & mod. to sev.RAE/ PASP 92mmHg/ mild TR/ ventricle septal motion show paradox   TRANSURETHRAL RESECTION OF BLADDER TUMOR N/A 02/13/2016   Procedure: TRANSURETHRAL RESECTION OF BLADDER TUMOR (TURBT);  Surgeon: Nickie Retort, MD;  Location: [facility];  Service: Urology;  Laterality: N/A;   TRANSURETHRAL RESECTION OF BLADDER TUMOR N/A 09/04/2016   Procedure: TRANSURETHRAL RESECTION OF BLADDER TUMOR (TURBT);  Surgeon: Nickie Retort, MD;  Location: [facility];  Service: Urology;  Laterality: N/A;    There were no vitals filed for this visit.    Subjective Assessment - 12/07/20 1619     Subjective Pt arrives to session w/ primary concerns of R-sided weakness s/p Lt frontal centrum semi ovale infarct secondary to small vessel disease. Pt reports he initially noticed symptoms, primarily difficulty using a fork in R, dominand hand, last Wednesday (11/23/20). He went to sleep thinking his symptoms would resolve, but woke up in the night w/ add'l LLE weakness and presented to the ED on Thursday morning (11/24/20).    Stayed over night in the hospital,    Report that he was having some increased balance issues.    Patient is accompained by: Family member   wife Webb Silversmith   Pertinent History Lt frontal centrum semi ovale infarct secondary to small vessel disease (onset 11/23/20), DVT/PE, paroxysmal a-fib on Eliquis, HLD, aortic valve replacement (x2) w/ bioprosthetic cardiac valve, mitral ring annuloplasty, TAVR, CAD    Limitations Walking;House hold activities    Patient Stated Goals be more stable with walking, safer no falls, feel stronger and better with walking                [facility] PT Assessment - 12/07/20 0001       Assessment   Medical Diagnosis Lt frontal centrum semi ovale infarct 2/2 small vessel disease    Referring Provider (PT) Saima Rizwan    Onset Date/Surgical Date 11/23/20    Hand Dominance Right    Next MD Visit 01/03/21    Prior Therapy  Yes      Precautions   Precautions Fall      Balance Screen   Has the patient fallen in the past 6 months No    Has the patient had a decrease in activity level because of a fear of falling?  Yes    Is the patient reluctant to leave their home because of a fear of falling?  No      Home Environment   Additional Comments 3 steps into the home, has an office up stairs, does some outside gardening and watering      Prior Function   Level of Independence Independent    Vocation Retired    [place] [employer] cars, no exercises      ROM / Strength   AROM / PROM / Strength AROM;Strength      AROM   AROM Assessment Site Hip    Right/Left Hip Right      Strength   Strength Assessment Site Hip;Knee;Ankle    Right Hip Flexion 4-/5    Right Hip Extension 4/5    Right Hip ABduction 4/5    Right/Left Knee Right    Right Knee Flexion 4/5    Right Knee Extension 4-/5    Right/Left Ankle Right;Left    Right Ankle Dorsiflexion 4-/5    Left Ankle Dorsiflexion 4-/5      Ambulation/Gait   Gait Comments  used the walker until last week, now not using a device, definetly at times with turning he loses balance, decreased arm swing, seemed to have a little lack of control with both feet, with turns he is unsteady, tends to catch himself      Standardized Balance Assessment   Standardized Balance Assessment Timed Up and Go Test;Berg Balance Test      Berg Balance Test   Sit to Stand Able to stand without using hands and stabilize independently    Standing Unsupported Able to stand safely 2 minutes    Sitting with Back Unsupported but Feet Supported on Floor or Stool Able to sit safely and securely 2 minutes    Stand to Sit Sits safely with minimal use of hands    Transfers Able to transfer safely, minor use of hands    Standing Unsupported with Eyes Closed Able to stand 10 seconds with supervision    Standing Unsupported with Feet Together Needs help to attain position but able to stand for 30 seconds with feet together    From Standing, Reach Forward with Outstretched Arm Can reach forward >12 cm safely (5")    From Standing Position, Pick up Object from Floor Able to pick up shoe, needs supervision    From Standing Position, Turn to Look Behind Over each Shoulder Turn sideways only but maintains balance    Turn 360 Degrees Needs close supervision or verbal cueing    Standing Unsupported, Alternately Place Feet on Step/Stool Able to complete >2 steps/needs minimal assist    Standing Unsupported, One Foot in Front Needs help to step but can hold 15 seconds    Standing on One Leg Unable to try or needs assist to prevent fall    Total Score 35      Timed Up and Go Test   Normal TUG (seconds) 14                        Objective measurements completed on examination: See above findings.  PT Short Term Goals - 12/07/20 1701       PT SHORT TERM GOAL #1   Title Pt will be I and compliant with initial HEP.    Time 2    Period Weeks    Status New                PT Long Term Goals - 12/07/20 1701       PT LONG TERM GOAL #1   Title Pt will be I and compliant with HEP.    Time 12    Period Weeks    Status New      PT LONG TERM GOAL #2   Title Pt will improv BERG balance score 46/56 to show improved balance    Time 12    Period Weeks    Status New      PT LONG TERM GOAL #3   Title decrease TUG time to 12 seconds    Time 12    Period Weeks    Status New      PT LONG TERM GOAL #4   Title increase knee and ankle strength to 4+/5    Time 12    Period Weeks    Status New                    Plan - 12/07/20 1657     Clinical Impression Statement Pt arrives to session w/ primary concerns of R-sided weakness s/p Lt frontal centrum semi ovale infarct secondary to small vessel disease. Pt reports he initially noticed symptoms, primarily difficulty using a fork in R, dominand hand, last Wednesday (11/23/20). He went to sleep thinking his symptoms would resolve, but woke up in the night w/ add'l LLE weakness and presented to the ED on Thursday morning (11/24/20).   He is unsteady on his feet tends to go backward with turns, shuffles feet, TUG was 14 seconds but BErg was 35/56.  He has some weakness in the right hip flexor and both ankle DF.  He has not had any falls but wife reports that he used a walker after the hospital until this week and now is using furniture for balance.    Stability/Clinical Decision Making Evolving/Moderate complexity    Clinical Decision Making Low    Rehab Potential Good    PT Frequency 2x / week    PT Duration 12 weeks    PT Treatment/Interventions ADLs/Self Care Home Management;Gait training;Neuromuscular re-education;Balance training;Therapeutic exercise;Therapeutic activities;Functional mobility training;Stair training;Patient/family education;Manual techniques    PT Next Visit Plan start strength, balance and safety    Consulted and Agree with Plan of Care Patient             Patient  will benefit from skilled therapeutic intervention in order to improve the following deficits and impairments:  Abnormal gait, Decreased coordination, Difficulty walking, Cardiopulmonary status limiting activity, Decreased endurance, Decreased activity tolerance, Decreased balance, Postural dysfunction, Decreased strength, Decreased mobility  Visit Diagnosis: Muscle weakness (generalized) - Plan: PT plan of care cert/re-cert  Difficulty in walking, not elsewhere classified - Plan: PT plan of care cert/re-cert  Other abnormalities of gait and mobility - Plan: PT plan of care cert/re-cert     Problem List Patient Active Problem List   Diagnosis Date Noted   Cerebral thrombosis with cerebral infarction 11/25/2020   Acute right-sided weakness 11/24/2020   History of mitral valve repair 11/26/2018   Prosthetic valve dysfunction 11/25/2018   Ectatic abdominal aorta (HCC) 11/25/2018  S/P valve-in-valve TAVR 11/25/2018   History of bladder cancer    History of pulmonary embolus (PE)    Acute on chronic diastolic heart failure ([place]) 04/23/2018   History of aortic valve replacement 04/23/2018   Atrial fibrillation ([place]) 04/23/2018   Coronary atherosclerosis of native coronary artery 02/25/2014   Hyperlipidemia 02/25/2014   Embolism and thrombosis ([place]) 03/11/2013    Sumner Boast., PT 12/07/2020, 5:32 PM  [place] [place], [name], [address] Phone: [phone]   Fax:  [phone]  Name: AJANI RINEER MRN: [id number] Date of Birth: 06-Feb-1930

## 2020-12-08 NOTE — Telephone Encounter (Signed)
Patient is due for follow up in our office.  I spoke with patient and scheduled him to see Dr Irish Lack tomorrow at 2:00.  Handicap form to be completed at this visit.

## 2020-12-08 NOTE — Progress Notes (Signed)
Cardiology Office Note   Date:  12/09/2020   ID:  Eric [name], DOB [birth year], MRN [id number]  PCP:  [name], MD    No chief complaint on file.  CAD  Wt Readings from Last 3 Encounters:  12/09/20 189 lb 9.6 oz (86 kg)  04/29/20 192 lb 3.2 oz (87.2 kg)  11/25/19 188 lb 12.8 oz (85.6 kg)       History of Present Illness: Eric [name] is a 87 y.o. male   with severe AI.   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   He has had an aortic valve replacement , MV repair and single vessel bypass in 2009.  He had a negative stress test in 2011. No chest pain prior to valve replacement surgery. He did have SHOB.   In late 2015, his heart rate was noted to be elevated at the time of a tooth extraction. He was found to be in atrial flutter. He underwent successful cardioversion. He notes that during the time he was out of rhythm, he had a cough. This got better after the cardioversion.   He went to [place] in February 2016 for about 3 weeks and did well on the NOAC.  Less bleeding and bruising on Eliquis compared to his experience Coumadin.  He then changed from Coumadin to Eliquis to treat multiple DVT and PE.   He had a tooth extraction. When he stayed on his anticoagulation, he had significant bleeding issues at that time. THe next time, he came off of the Eliquis.   In late 2017, he had several instances of blood clots in his urine. He underwent cystoscopy in the urology clinic after holding out was for 4 days. This showed a malignancy. He had surgery and tolerated this well.   He has noticed more edema in his right leg since surgery back in 2017.  It persists.    In late 2019, he was in [place] and was volume overloaded and short of breath. He was given IV Lasix and diuresed well. He was kept overnight.  He was also found to be in AFib.  He dd not feel  Palpitations.     They were eating out all of the time.  He had stopped his Lasix with his bladder  cancer.  Echo showed normal LV function and 3+ AI.     In early 2020, He had a procedure on the left ear.  It was bleeding and it was cauterized.  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       I spoke to his PMD, Dr. Laurann Montana in 07/2018.  His labs revealed concern for low grade hemolysis.  TEE was considered but the patient was in [place], and given the COVID outbreak, the procedure was postponed.     He required repeat surgical procedure on the squamous cell area.  He had some bleeding post procedure and required more stitches.  He required a skin graft.     TEE was done in 09/2018:  The left ventricle has low normal systolic function, with an ejection fraction of 50-55%. Left ventricular diastolic function could not be evaluated. No evidence of left ventricular regional wall motion abnormalities.  2. The right ventricle has normal systolc function. The cavity was normal. There is no increase in right ventricular wall thickness.  3. Left atrial size was mildly dilated.  4. Right atrial size was mildly dilated.  5. Moderately thickened tricuspid valve leaflets.  6. 68m Sorin mitral  valve repair annular ring intact.  7. 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  8. A 28 Mitralflow porcine bioprosthesis valve is present in the aortic position. Echo findings are consistent with intravalvular regurgitation of the aortic prosthesis.  9. Aortic valve regurgitation is severe by color flow Doppler. Mild stenosis of the aortic valve. 10. No vegetation on the aortic valve. 11. The aortic root is normal in size and structure. 12. Previously ligated left atrial appendage. 13. Severe bioprosthetic aortic valve regurgitation.   D/w Dr. [name] with plans for TAVR w/u.   POst TAVR f/u showed: "He was evaluated by the multidisciplinary valve team and underwent successful valve in valve TAVR with a 23 mm Edwards Sapien 3 THV via the TF approach on 11/25/18. Post operative echo  showed EF 50-55%, severe MAC with mild MS, mild-mod TR and normally functioning TAVR with mean gradient 10.53mHg and no PVL. His BP was noted to be elevated post operatively and he was started on Losartan 271mdaily. He was discharged on home Eliquis and aspirin. Lasix decreased from 405maily to 31m37mily.    Follow up labs showed increase in creat 1.05--> 1.44. Lasix decreased to 31mg34mry other day. Bilirubin was still elevated and this was sent to his PCP for review."   He has had some dental issues.  He had to have a tooth pulled in 03/2019.  Eliquis was continued.     He is in a balance class also, 2x/week.  Fell while playing pickle ball.   TAVR clinic visit in 7/21, things were stable.   Had some low BP in 10/21.  Holding losartan when BP is around 130 mm Hg. BP has been in the [id number] systolic since then.  CVA in 11/2020 which was from small vessel disease, while on Eliquis. Losartan has been on hold.  Aspirin was added after stroke per neuro.    He feels that he is improving. No pain.  Walking slower and right hand is less coordination.   Denies : Chest pain. Dizziness. Leg edema. Nitroglycerin use. Orthopnea. Palpitations. Paroxysmal nocturnal dyspnea. Shortness of breath. Syncope.     Past Medical History:  Diagnosis Date   Atrial flutter, paroxysmal (HCC) [place]. dx 04-02-2014, s/p  successful cardioversion 04-06-2014.   Chronic anticoagulation    on Eliquis--  due to recurrent dvt's and pe's   Dyslipidemia    History of bladder cancer urologist-  dr budzyPilar Jarvis-25-2017  s/p TURBT per path high grade papillary urothelial carcinoma   History of DVT (deep vein thrombosis)    2000-- RLE   History of melanoma excision    left flank;  07/ 2014 left lower leg and right upper chest   History of prostate cancer    Gleason 6--  s/p  radical prostatectomy 06/ 2000 in [place]---  no recurrence   History of pulmonary embolus (PE)    2000 & 2005  bilateral   Hx of  valvuloplasty 06/19/2007   a. 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 10/2010 Ex MV: EF 72%, inf attenuation w/o ischemia, brief run of PAT with exercise. To   Thrombocytopenia (  [place])    Thyroid goiter 2013   nodular   Wears hearing aid    BILATERAL   Wears partial dentures    UPPER    Past Surgical History:  Procedure Laterality Date   AORTIC VALVE REPLACEMENT (AVR)/CORONARY ARTERY BYPASS GRAFTING (CABG)  06-19-2007  dr [name]   SVG to OM1 ;   AVR w/ #23 Mitralflow pericardial and ligation left atrial appendage;  MV repair w/ #28 Sorin 3-D memo Ring Annuloplasty with repair ruptured chordar of P-2 flail segments   CARDIAC CATHETERIZATION  05-12-2007  dr Leonia Reeves   single vessel 30% ostial LAD,  80% LCx;  severe to critial AS,  moderate MR,  normal LVF, ef 70%,  normal right heart pressures and cardiac outputs,  mild elevated LV end-diastolic pressure     CARDIOVASCULAR STRESS TEST  11-02-2010  dr Irish Lack   normal nuclear study w/ no ischemia or infarct/scar/  normal LV function and wall motion , ef 72%   CARDIOVERSION N/A 04/06/2014   Procedure: CARDIOVERSION;  Surgeon: [name], MD;  Location: [facility];  Service: Cardiovascular;  Laterality: N/A;   successful   CATARACT EXTRACTION W/ INTRAOCULAR LENS  IMPLANT, BILATERAL  2012  approx   CORONARY ARTERY BYPASS GRAFT     RETROPUBIC RADICAL PROSTATECTOMY  06/ 2000  in [place], [place]   RIGHT/LEFT HEART CATH AND CORONARY/GRAFT ANGIOGRAPHY N/A 10/29/2018   Procedure: RIGHT/LEFT HEART CATH AND CORONARY/GRAFT ANGIOGRAPHY;  Surgeon: [name], MD;  Location: [facility];  Service: Cardiovascular;  Laterality: N/A;   ROTATOR CUFF REPAIR Left  09/1998   TEE WITHOUT CARDIOVERSION N/A 04/06/2014   Procedure: TRANSESOPHAGEAL ECHOCARDIOGRAM (TEE);  Surgeon: [name], MD;  Location[facility] ENDOSCOPY;  Service: Cardiovascular;  Laterality: N/A;  mild focal basa LVH of the septum, ef 50-55%, s/p AV annuloplasty ring, elongated chordae, thickened leaflets, mild to mod. MR, multiple small jets/arotic bioprosthetic valve sits well in position, mild AI/ thickened TV w/ mild reurg./mild LA   TEE WITHOUT CARDIOVERSION N/A 10/22/2018   Procedure: TRANSESOPHAGEAL ECHOCARDIOGRAM (TEE);  Surgeon: [name], MD;  Location: [facility] ENDOSCOPY;  Service: Cardiovascular;  Laterality: N/A;   TEE WITHOUT CARDIOVERSION N/A 11/25/2018   Procedure: TRANSESOPHAGEAL ECHOCARDIOGRAM (TEE);  Surgeon: [name], MD;  Location: [facility];  Service: Open Heart Surgery;  Laterality: N/A;   TRANSCATHETER AORTIC VALVE REPLACEMENT, TRANSFEMORAL N/A 11/25/2018   Procedure: TRANSCATHETER AORTIC VALVE REPLACEMENT, TRANSFEMORAL;  Surgeon: [name], MD;  Location: [facility];  Service: Open Heart Surgery;  Laterality: N/A;   TRANSTHORACIC ECHOCARDIOGRAM  03-23-2016   dr Irish Lack   EF 55-60%, reduced contribution atrial contraction to ventricular filling, due to increased ventricular diastolic pressure or atrial contratile dysfunction/  bioprosthetic AV w/ mod. regurg. (valve area 1.97cm^2)/ mild dilated ascending aorta/ mild thicken MV normal function annular ring prosthesis/severe LAE & mod. to sev.RAE/ PASP 42mHg/ mild TR/ ventricle septal motion show paradox   TRANSURETHRAL RESECTION OF BLADDER TUMOR N/A 02/13/2016   Procedure: TRANSURETHRAL RESECTION OF BLADDER TUMOR (TURBT);  Surgeon: [name] MD;  [facility]  Service: Urology;  Laterality: N/A;   TRANSURETHRAL RESECTION OF BLADDER TUMOR N/A 09/04/2016   Procedure: TRANSURETHRAL RESECTION OF BLADDER TUMOR (TURBT);  Surgeon: [name] MD;  [facility]   Service: Urology;  Laterality: N/A;     Current Outpatient Medications  Medication Sig Dispense Refill   amoxicillin (AMOXIL) 500 MG tablet Take 4 tablets (2,000 mg total) by mouth  as directed. Take 1 hour prior to dental work, including cleanings. 4 tablet 6   aspirin EC 81 MG tablet Take 81 mg by mouth daily. Swallow whole.     atorvastatin (LIPITOR) 40 MG tablet TAKE 1 TABLET BY MOUTH IN  THE EVENING 90 tablet 2   ELIQUIS 5 MG TABS tablet TAKE 1 TABLET BY MOUTH  TWICE DAILY 180 tablet 1   Eyelid Cleansers (OCUSOFT EYELID CLEANSING) PADS Place 1 application into the left eye in the morning.     ezetimibe (ZETIA) 10 MG tablet TAKE 1 TABLET BY MOUTH IN  THE EVENING 90 tablet 3   furosemide (LASIX) 20 MG tablet TAKE 1 TABLET BY MOUTH  EVERY OTHER DAY, MAY TAKE 1 ADDITIONAL DOSE IF NEEDED  FOR LEG SWELLING 90 tablet 3   losartan (COZAAR) 25 MG tablet Take 25 mg by mouth as needed.     No current facility-administered medications for this visit.    Allergies:   Patient has no known allergies.    Social History:  The patient  reports that he has never smoked. He has never used smokeless tobacco. He reports current alcohol use of about 1.0 - 2.0 standard drink of alcohol per week. He reports that he does not use drugs.   Family History:  The patient's family history includes Leukemia in his mother; Supraventricular tachycardia in his sister.    ROS:  Please see the history of present illness.   Otherwise, review of systems are positive for recent CVA.   All other systems are reviewed and negative.    PHYSICAL EXAM: VS:  BP (!) 144/84   Pulse 68   Ht 6' (1.829 m)   Wt 189 lb 9.6 oz (86 kg)   SpO2 98%   BMI 25.71 kg/m  , BMI Body mass index is 25.71 kg/m. GEN: Well nourished, well developed, in no acute distress HEENT: normal Neck: no JVD, carotid bruits, or masses Cardiac: irregularly irregular; 2/6 early systolc murmurs, rubs, or gallops,no edema  Respiratory:  clear to  auscultation bilaterally, normal work of breathing GI: soft, nontender, nondistended, + BS MS: no deformity or atrophy Skin: warm and dry, no rash Neuro:  Strength and sensation are intact Psych: euthymic mood, full affect   EKG:   The ekg ordered today demonstrates AFib, controlled rate   Recent Labs: 11/24/2020: ALT 20 11/25/2020: BUN 13; Creatinine, Ser 0.85; Hemoglobin 15.1; Magnesium 1.9; Platelets 138; Potassium 3.7; Sodium 136   Lipid Panel    Component Value Date/Time   CHOL 127 11/25/2020 0258   CHOL 130 04/27/2019 0840   TRIG 57 11/25/2020 0258   HDL 50 11/25/2020 0258   HDL 63 04/27/2019 0840   CHOLHDL 2.5 11/25/2020 0258   VLDL 11 11/25/2020 0258   LDLCALC 66 11/25/2020 0258   LDLCALC 53 04/27/2019 0840     Other studies Reviewed: Additional studies/ records that were reviewed today with results demonstrating: Hospital records reviewed.  LDL 66 in July 2022   ASSESSMENT AND PLAN:  AFib: recent CVA due to small vessel disease.  Aspirin was added to Eliquis recently due to CVA.  Watch for bleeding.  Duration of the aspirin in addition to Eliquis should be discussed with neurology at her follow-up appointment. S/p TAVR: Continue SBE prophylaxis.  No CHF symptoms. CAD: No angina.  Continue aggressive secondary prevention. Chronic diastolic heart failure: No CHF. Appears euvolemic.  HTN: As needed losartan, based on BP, for SBP > 150.  Prior DVT: on Eliquis.  Follow CBC and BMet q 6 months, especially with aspirin added.    Current medicines are reviewed at length with the patient today.  The patient concerns regarding his medicines were addressed.  The following changes have been made:  No change  Labs/ tests ordered today include:  No orders of the defined types were placed in this encounter.   Recommend 150 minutes/week of aerobic exercise Low fat, low carb, high fiber diet recommended  Disposition:   FU in 6 months   Signed, [name], MD   12/09/2020 2:26 PM    [facility] [address], [place], [place]  [address] Phone: [phone]; Fax: [phone]

## 2020-12-09 ENCOUNTER — Other Ambulatory Visit: Payer: Self-pay

## 2020-12-09 ENCOUNTER — Ambulatory Visit: Payer: Medicare Other | Admitting: Interventional Cardiology

## 2020-12-09 ENCOUNTER — Encounter: Payer: Self-pay | Admitting: Interventional Cardiology

## 2020-12-09 VITALS — BP 144/84 | HR 68 | Ht 72.0 in | Wt 189.6 lb

## 2020-12-09 DIAGNOSIS — I25118 Atherosclerotic heart disease of native coronary artery with other forms of angina pectoris: Secondary | ICD-10-CM

## 2020-12-09 DIAGNOSIS — I10 Essential (primary) hypertension: Secondary | ICD-10-CM | POA: Diagnosis not present

## 2020-12-09 DIAGNOSIS — Z952 Presence of prosthetic heart valve: Secondary | ICD-10-CM | POA: Diagnosis not present

## 2020-12-09 DIAGNOSIS — Z9889 Other specified postprocedural states: Secondary | ICD-10-CM

## 2020-12-09 DIAGNOSIS — I5032 Chronic diastolic (congestive) heart failure: Secondary | ICD-10-CM | POA: Diagnosis not present

## 2020-12-09 DIAGNOSIS — I482 Chronic atrial fibrillation, unspecified: Secondary | ICD-10-CM

## 2020-12-09 DIAGNOSIS — N179 Acute kidney failure, unspecified: Secondary | ICD-10-CM

## 2020-12-09 NOTE — Patient Instructions (Signed)
Medication Instructions:  Your physician recommends that you continue on your current medications as directed. Please refer to the Current Medication list given to you today.  *If you need a refill on your cardiac medications before your next appointment, please call your pharmacy*   Lab Work: none If you have labs (blood work) drawn today and your tests are completely normal, you will receive your results only by: [place] (if you have MyChart) OR A paper copy in the mail If you have any lab test that is abnormal or we need to change your treatment, we will call you to review the results.   Testing/Procedures: none   Follow-Up: At [facility], you and your health needs are our priority.  As part of our continuing mission to provide you with exceptional heart care, we have created designated Provider Care Teams.  These Care Teams include your primary Cardiologist (physician) and Advanced Practice Providers (APPs -  Physician Assistants and Nurse Practitioners) who all work together to provide you with the care you need, when you need it.  We recommend signing up for the patient portal called "MyChart".  Sign up information is provided on this After Visit Summary.  MyChart is used to connect with patients for Virtual Visits (Telemedicine).  Patients are able to view lab/test results, encounter notes, upcoming appointments, etc.  Non-urgent messages can be sent to your provider as well.   To learn more about what you can do with MyChart, go to [email or web address].    Your next appointment:   January 25,2023 at 11:20  The format for your next appointment:   In Person  Provider:   Casandra Doffing, MD   Other Instructions

## 2020-12-10 ENCOUNTER — Other Ambulatory Visit: Payer: Self-pay | Admitting: Interventional Cardiology

## 2020-12-13 ENCOUNTER — Other Ambulatory Visit: Payer: Self-pay

## 2020-12-13 ENCOUNTER — Encounter: Payer: Self-pay | Admitting: Physical Therapy

## 2020-12-13 ENCOUNTER — Ambulatory Visit: Payer: Medicare Other | Admitting: Physical Therapy

## 2020-12-13 ENCOUNTER — Ambulatory Visit: Payer: Medicare Other | Admitting: Occupational Therapy

## 2020-12-13 ENCOUNTER — Encounter: Payer: Self-pay | Admitting: Occupational Therapy

## 2020-12-13 DIAGNOSIS — R262 Difficulty in walking, not elsewhere classified: Secondary | ICD-10-CM

## 2020-12-13 DIAGNOSIS — R278 Other lack of coordination: Secondary | ICD-10-CM | POA: Diagnosis not present

## 2020-12-13 DIAGNOSIS — R2689 Other abnormalities of gait and mobility: Secondary | ICD-10-CM

## 2020-12-13 DIAGNOSIS — I69319 Unspecified symptoms and signs involving cognitive functions following cerebral infarction: Secondary | ICD-10-CM

## 2020-12-13 DIAGNOSIS — M6281 Muscle weakness (generalized): Secondary | ICD-10-CM

## 2020-12-13 DIAGNOSIS — R41842 Visuospatial deficit: Secondary | ICD-10-CM

## 2020-12-13 NOTE — Therapy (Signed)
[place]. [place], [name], [address] Phone: [phone]   Fax:  [phone]  Physical Therapy Treatment  Patient Details  Name: Eric [name] MRN: [id number] Date of Birth: 11-20-1929 Referring Provider (PT): Debbe Odea   Encounter Date: 12/13/2020   PT End of Session - 12/13/20 1540     Visit Number 2    Date for PT Re-Evaluation 03/09/21    Authorization Type [employer]    PT Start Time [id number]    PT Stop Time 1527    PT Time Calculation (min) 42 min    Activity Tolerance Patient tolerated treatment well    Behavior During Therapy [facility] for tasks assessed/performed             Past Medical History:  Diagnosis Date   Atrial flutter, paroxysmal ([place])    a. dx 04-02-2014, s/p  successful cardioversion 04-06-2014.   Chronic anticoagulation    on Eliquis--  due to recurrent dvt's and pe's   Dyslipidemia    History of bladder cancer urologist-  dr [name]   02-13-2016  s/p TURBT per path high grade papillary urothelial carcinoma   History of DVT (deep vein thrombosis)    2000-- RLE   History of melanoma excision    left flank;  07/ 2014 left lower leg and right upper chest   History of prostate cancer    Gleason 6--  s/p  radical prostatectomy 06/ 2000 in [place]---  no recurrence   History of pulmonary embolus (PE)    2000 & 2005  bilateral   Hx of valvuloplasty 06/19/2007   a. 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 05/2007 CABG x 1: s/p SVG-OM;  b. 10/2010 Ex MV: EF 72%, inf attenuation w/o ischemia, brief run of PAT with exercise. To   Thrombocytopenia ([place])    Thyroid goiter 2013   nodular   Wears hearing aid    BILATERAL   Wears partial  dentures    UPPER    Past Surgical History:  Procedure Laterality Date   AORTIC VALVE REPLACEMENT (AVR)/CORONARY ARTERY BYPASS GRAFTING (CABG)  06-19-2007  dr [name]   SVG to OM1 ;   AVR w/ #23 Mitralflow pericardial and ligation left atrial appendage;  MV repair w/ #28 Sorin 3-D memo Ring Annuloplasty with repair ruptured chordar of P-2 flail segments   CARDIAC CATHETERIZATION  05-12-2007  dr Leonia Reeves   single vessel 30% ostial LAD,  80% LCx;  severe to critial AS,  moderate MR,  normal LVF, ef 70%,  normal right heart pressures and cardiac outputs,  mild elevated LV end-diastolic pressure     CARDIOVASCULAR STRESS TEST  11-02-2010  dr Irish Lack   normal nuclear study w/ no ischemia or infarct/scar/  normal LV function and wall motion , ef 72%   CARDIOVERSION N/A 04/06/2014   Procedure: CARDIOVERSION;  Surgeon: [name], MD;  Location: [place];  Service: Cardiovascular;  Laterality: N/A;   successful   CATARACT EXTRACTION W/ INTRAOCULAR LENS  IMPLANT, BILATERAL  2012  approx   CORONARY ARTERY BYPASS GRAFT     RETROPUBIC RADICAL PROSTATECTOMY  06/ 2000  in [place]   RIGHT/LEFT HEART CATH AND CORONARY/GRAFT  ANGIOGRAPHY N/A 10/29/2018   Procedure: RIGHT/LEFT HEART CATH AND CORONARY/GRAFT ANGIOGRAPHY;  Surgeon: [name], MD;  Location: [place] CV LAB;  Service: Cardiovascular;  Laterality: N/A;   ROTATOR CUFF REPAIR Left 09/1998   TEE WITHOUT CARDIOVERSION N/A 04/06/2014   Procedure: TRANSESOPHAGEAL ECHOCARDIOGRAM (TEE);  Surgeon: [name], MD;  Location: [facility] ENDOSCOPY;  Service: Cardiovascular;  Laterality: N/A;  mild focal basa LVH of the septum, ef 50-55%, s/p AV annuloplasty ring, elongated chordae, thickened leaflets, mild to mod. MR, multiple small jets/arotic bioprosthetic valve sits well in position, mild AI/ thickened TV w/ mild reurg./mild LA   TEE WITHOUT CARDIOVERSION N/A 10/22/2018   Procedure: TRANSESOPHAGEAL ECHOCARDIOGRAM (TEE);  Surgeon: [name], MD;  Location: [facility] ENDOSCOPY;  Service: Cardiovascular;  Laterality: N/A;   TEE WITHOUT CARDIOVERSION N/A 11/25/2018   Procedure: TRANSESOPHAGEAL ECHOCARDIOGRAM (TEE);  Surgeon: [name], MD;  Location: [place];  Service: Open Heart Surgery;  Laterality: N/A;   TRANSCATHETER AORTIC VALVE REPLACEMENT, TRANSFEMORAL N/A 11/25/2018   Procedure: TRANSCATHETER AORTIC VALVE REPLACEMENT, TRANSFEMORAL;  Surgeon: [name], MD;  Location: [place];  Service: Open Heart Surgery;  Laterality: N/A;   TRANSTHORACIC ECHOCARDIOGRAM  03-23-2016   dr Irish Lack   EF 55-60%, reduced contribution atrial contraction to ventricular filling, due to increased ventricular diastolic pressure or atrial contratile dysfunction/  bioprosthetic AV w/ mod. regurg. (valve area 1.97cm^2)/ mild dilated ascending aorta/ mild thicken MV normal function annular ring prosthesis/severe LAE & mod. to sev.RAE/ PASP 37mHg/ mild TR/ ventricle septal motion show paradox   TRANSURETHRAL RESECTION OF BLADDER TUMOR N/A 02/13/2016   Procedure: TRANSURETHRAL RESECTION OF BLADDER TUMOR (TURBT);  Surgeon: [name] MD;  [facility]  Service: Urology;  Laterality: N/A;   TRANSURETHRAL RESECTION OF BLADDER TUMOR N/A 09/04/2016   Procedure: TRANSURETHRAL RESECTION OF BLADDER TUMOR (TURBT);  Surgeon: [name] MD;  [facility]  Service: Urology;  Laterality: N/A;    There were no vitals filed for this visit.   Subjective Assessment - 12/13/20 1453     Subjective No falls, "I feel like I am doing well"    Currently in Pain? No/denies                               [facility] - 12/13/20 0001       High Level Balance   High Level Balance Activities Backward walking;Negotitating around obstacles;Negotiating over obstacles    High Level Balance Comments walking ball toss, 6" step toe touches, then tried on the airex, on airex  ball toss      Exercises   Exercises Knee/Hip      Knee/Hip Exercises: Aerobic   Nustep level 4 x 6 minutes      Knee/Hip Exercises: Machines for Strengthening   Cybex Knee Extension 10# 2x10    Cybex Knee Flexion 25# 2x10    Cybex Leg Press 20# 2x10                      PT Short Term Goals - 12/07/20 1701       PT SHORT TERM GOAL #1   Title Pt will be I and compliant with initial HEP.    Time 2    Period Weeks    Status New               PT Long Term Goals -  12/07/20 1701       PT LONG TERM GOAL #1   Title Pt will be I and compliant with HEP.    Time 12    Period Weeks    Status New      PT LONG TERM GOAL #2   Title Pt will improv BERG balance score 46/56 to show improved balance    Time 12    Period Weeks    Status New      PT LONG TERM GOAL #3   Title decrease TUG time to 12 seconds    Time 12    Period Weeks    Status New      PT LONG TERM GOAL #4   Title increase knee and ankle strength to 4+/5    Time 12    Period Weeks    Status New                   Plan - 12/13/20 1541     Clinical Impression Statement Patient did well with the strength portion of the exercises, he really struggled with the balance, especially on a dynamic surface, he tended to really be back on his heels and he could not react well, had some ankle and some hip strategies but not much of a step and he would get to the point of needing Mod/Max A to correct for LOB.    PT Next Visit Plan be careful with the balance tends to lose balance    Consulted and Agree with Plan of Care Patient             Patient will benefit from skilled therapeutic intervention in order to improve the following deficits and impairments:  Abnormal gait, Decreased coordination, Difficulty walking, Cardiopulmonary status limiting activity, Decreased endurance, Decreased activity tolerance, Decreased balance, Postural dysfunction, Decreased strength, Decreased mobility  Visit  Diagnosis: Other lack of coordination  Muscle weakness (generalized)  Difficulty in walking, not elsewhere classified  Other abnormalities of gait and mobility     Problem List Patient Active Problem List   Diagnosis Date Noted   Cerebral thrombosis with cerebral infarction 11/25/2020   Acute right-sided weakness 11/24/2020   History of mitral valve repair 11/26/2018   Prosthetic valve dysfunction 11/25/2018   Ectatic abdominal aorta ([place]) 11/25/2018   S/P valve-in-valve TAVR 11/25/2018   History of bladder cancer    History of pulmonary embolus (PE)    Acute on chronic diastolic heart failure ([place]) 04/23/2018   History of aortic valve replacement 04/23/2018   Atrial fibrillation ([place]) 04/23/2018   Coronary atherosclerosis of native coronary artery 02/25/2014   Hyperlipidemia 02/25/2014   Embolism and thrombosis ([place]) 03/11/2013    Sumner Boast., PT 12/13/2020, 4:13 PM  [place]. [place], [name], [address] Phone: [phone]   Fax:  [phone]  Name: ZADAN ENSZ MRN: [id number] Date of Birth: Jul 09, 1929

## 2020-12-14 NOTE — Therapy (Signed)
[place]. [place], [name], [address] Phone: [phone]   Fax:  [phone]  Occupational Therapy Treatment  Patient Details  Name: Eric [name] MRN: [id number] Date of Birth: July 20, 1929 Referring Provider (OT): Debbe Odea, MD   Encounter Date: 12/13/2020   OT End of Session - 12/13/20 1535     Visit Number 3    Number of Visits 13    Date for OT Re-Evaluation 02/27/21    Authorization Type [employer] ($10 copay/day)    Authorization Time Period VL: MN    Progress Note Due on Visit 10    OT Start Time 1533    OT Stop Time 1615    OT Time Calculation (min) 42 min    Activity Tolerance Patient tolerated treatment well    Behavior During Therapy [facility] for tasks assessed/performed             Past Medical History:  Diagnosis Date   Atrial flutter, paroxysmal ([place])    a. dx 04-02-2014, s/p  successful cardioversion 04-06-2014.   Chronic anticoagulation    on Eliquis--  due to recurrent dvt's and pe's   Dyslipidemia    History of bladder cancer urologist-  dr [name]   02-13-2016  s/p TURBT per path high grade papillary urothelial carcinoma   History of DVT (deep vein thrombosis)    2000-- RLE   History of melanoma excision    left flank;  07/ 2014 left lower leg and right upper chest   History of prostate cancer    Gleason 6--  s/p  radical prostatectomy 06/ 2000 in [place]---  no recurrence   History of pulmonary embolus (PE)    2000 & 2005  bilateral   Hx of valvuloplasty 06/19/2007   a. 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 05/2007 CABG x 1: s/p SVG-OM;  b. 10/2010 Ex MV: EF 72%, inf attenuation w/o ischemia, brief run of PAT  with exercise. To   Thrombocytopenia ([place])    Thyroid goiter 2013   nodular   Wears hearing aid    BILATERAL   Wears partial dentures    UPPER    Past Surgical History:  Procedure Laterality Date   AORTIC VALVE REPLACEMENT (AVR)/CORONARY ARTERY BYPASS GRAFTING (CABG)  06-19-2007  dr [name]   SVG to OM1 ;   AVR w/ #23 Mitralflow pericardial and ligation left atrial appendage;  MV repair w/ #28 Sorin 3-D memo Ring Annuloplasty with repair ruptured chordar of P-2 flail segments   CARDIAC CATHETERIZATION  05-12-2007  dr Leonia Reeves   single vessel 30% ostial LAD,  80% LCx;  severe to critial AS,  moderate MR,  normal LVF, ef 70%,  normal right heart pressures and cardiac outputs,  mild elevated LV end-diastolic pressure     CARDIOVASCULAR STRESS TEST  11-02-2010  dr Irish Lack   normal nuclear study w/ no ischemia or infarct/scar/  normal LV function and wall motion , ef 72%   CARDIOVERSION N/A 04/06/2014   Procedure: CARDIOVERSION;  Surgeon: [name], MD;  Location: [place];  Service: Cardiovascular;  Laterality: N/A;   successful   CATARACT EXTRACTION W/ INTRAOCULAR LENS  IMPLANT, BILATERAL  2012  approx   CORONARY ARTERY BYPASS GRAFT     RETROPUBIC RADICAL PROSTATECTOMY  06/ 2000  in [place], [place]   RIGHT/LEFT HEART CATH AND CORONARY/GRAFT ANGIOGRAPHY N/A 10/29/2018   Procedure: RIGHT/LEFT HEART CATH AND CORONARY/GRAFT ANGIOGRAPHY;  Surgeon: [name], MD;  Location: [place] CV LAB;  Service: Cardiovascular;  Laterality: N/A;   ROTATOR CUFF REPAIR Left 09/1998   TEE WITHOUT CARDIOVERSION N/A 04/06/2014   Procedure: TRANSESOPHAGEAL ECHOCARDIOGRAM (TEE);  Surgeon: [name], MD;  [facility] END[facility]COPY;  Service: Cardiovascular;  Laterality: N/A;  mild focal basa LVH of the septum, ef 50-55%, s/p AV annuloplasty ring, elongated chordae, thickened leaflets, mild to mod. MR, multiple small jets/arotic bioprosthetic valve sits well in position, mild AI/ thickened TV w/  mild reurg./mild LA   TEE WITHOUT CARDIOVERSION N/A 10/22/2018   Procedure: TRANSESOPHAGEAL ECHOCARDIOGRAM (TEE);  Surgeon: [name], MD;  Location: [facility] ENDOSCOPY;  Service: Cardiovascular;  Laterality: N/A;   TEE WITHOUT CARDIOVERSION N/A 11/25/2018   Procedure: TRANSESOPHAGEAL ECHOCARDIOGRAM (TEE);  Surgeon: [name], MD;  Location: [place];  Service: Open Heart Surgery;  Laterality: N/A;   TRANSCATHETER AORTIC VALVE REPLACEMENT, TRANSFEMORAL N/A 11/25/2018   Procedure: TRANSCATHETER AORTIC VALVE REPLACEMENT, TRANSFEMORAL;  Surgeon: [name], MD;  Location: [place];  Service: Open Heart Surgery;  Laterality: N/A;   TRANSTHORACIC ECHOCARDIOGRAM  03-23-2016   dr Irish Lack   EF 55-60%, reduced contribution atrial contraction to ventricular filling, due to increased ventricular diastolic pressure or atrial contratile dysfunction/  bioprosthetic AV w/ mod. regurg. (valve area 1.97cm^2)/ mild dilated ascending aorta/ mild thicken MV normal function annular ring prosthesis/severe LAE & mod. to sev.RAE/ PASP 64mHg/ mild TR/ ventricle septal motion show paradox   TRANSURETHRAL RESECTION OF BLADDER TUMOR N/A 02/13/2016   Procedure: TRANSURETHRAL RESECTION OF BLADDER TUMOR (TURBT);  Surgeon: [name] MD;  [facility]  Service: Urology;  Laterality: N/A;   TRANSURETHRAL RESECTION OF BLADDER TUMOR N/A 09/04/2016   Procedure: TRANSURETHRAL RESECTION OF BLADDER TUMOR (TURBT);  Surgeon: [name] MD;  [facility]  Service: Urology;  Laterality: N/A;    There were no vitals filed for this visit.   Subjective Assessment - 12/13/20 1534     Subjective  Pt requested to keep copy of his handwriting practice to show his wife    Pertinent History Lt frontal centrum semiovale infarct 2/2 to small vessel disease (onset 11/23/20), DVT/PE, paroxysmal a-fib on Eliquis, aortic valve replacement (x2) w/ bioprosthetic cardiac valve, mitral ring  annuloplasty, TAVR, CAD, Lt rotator cuff repair sx (May 2000), HLD    Currently in Pain? No/denies             Treatment/Exercises - 12/13/20    Handwriting Practiced writing 9-word sentence w/ both weighted utensil and utensil w/ built-up handle. Pt demo'd improved control/smoothness/spacing with print and cursive when using weighted utensil; legibility w/ print > w/ cursive  Tracing letters of simple tabletop word search using standard marker; pt was able to stay on the line w/ min deviations decreased smoothness of lines. Activity used to facilitate increased speed and coordination during handwriting tasks. Also completed distal finger control worksheet w/ good accuracy; copy provided for practice at home    Coordination Activity Picking up 1" blocks and placing in a stack using 2-pt pinch w/ min cues/difficulty to facilitate [facility] slow/controlled movements, and precision grasp/release            OT Education - 12/13/20 1615  Education Details Education provided on use/benefit of weighted writing utensil w/ handout provided for self-purchase. Introduced memory compensatory strategies.    Person(s) Educated Patient    Methods Explanation;Demonstration;Handout    Comprehension Verbalized understanding;Returned demonstration             OT Short Term Goals - 12/13/20 1651       OT SHORT TERM GOAL #1   Title Pt will verbalize understanding of memory compensatory strategies to improve safety at home    Baseline Recent limitations w/ memory    Time 3    Period Weeks    Status Achieved   12/13/20   Target Date 12/23/20      OT SHORT TERM GOAL #2   Title Pt will identify at least 7/10 targets during environmental scanning activity w/out assist to improve safety w/ functional mobility    Time 3    Period Weeks    Status On-going      OT SHORT TERM GOAL #3   Title Pt will improve coordination as evidenced by decreasing 9-HPT time by at least 5 sec w/ R, dominant hand     Baseline R hand 48 sec; L hand 36 sec    Time 3    Period Weeks    Status On-going      OT SHORT TERM GOAL #4   Title Pt will demonstrate understanding of compensatory strategies, including use of AE, to implement during BADLs/IADLs to increase safety and independence prn    Baseline Decreased knowledge of compensatory strategies    Time 3    Period Weeks    Status On-going             OT Long Term Goals - 12/07/20 1537       OT LONG TERM GOAL #1   Title Pt will be independent w/ HEP designed for [facility] and coordination by discharge    Baseline No HEP at this time    Time 6    Period Weeks    Status On-going      OT LONG TERM GOAL #2   Title Pt will improve coordination as evidenced by decreasing 9-HPT time by at least 9 sec w/ R, dominant hand    Baseline R hand 48 sec; L hand 36 sec    Time 6    Period Weeks    Status On-going      OT LONG TERM GOAL #3   Title Pt will write a sentence w/ at least 75% legibility and sizing in pt-determined acceptable level of time, using AE prn    Baseline Pt reports micrographia and increased time; pt keeps a written blog    Time 6    Period Weeks    Status On-going      OT LONG TERM GOAL #4   Title Pt will be able to sequence through an IADL task (e.g., med mgmt, housekeeping) w/ Mod I    Baseline Decreased organization of thought and sequencing    Time 6    Period Weeks    Status On-going             Plan - 12/13/20 1555     Clinical Impression Statement Pt did very well w/ handwriting and coordination activities this session; most benefit exhibited w/ use of pen weights for control/smoothness and spacing of handwriting. Difficulty w/ coordination appears primary related to higher level FM skills and in-hand manipulation.    OT Occupational Profile and History Detailed Assessment- Review  of Records and additional review of physical, cognitive, psychosocial history related to current functional performance    Occupational  performance deficits (Please refer to evaluation for details): ADL's;IADL's;Leisure;Social Participation    Body Structure / Function / Physical Skills ADL;Decreased knowledge of use of DME;Gait;Strength;Balance;Dexterity;GMC;UE functional use;Body mechanics;Hearing;Cardiopulmonary status limiting activity;Endurance;IADL;ROM;Vision;Coordination;Mobility;Sensation;FMC;Decreased knowledge of precautions    Cognitive Skills Attention;Memory;Problem Solve;Safety Awareness;Sequencing    Psychosocial Skills Environmental  Adaptations    Rehab Potential Good    Clinical Decision Making Several treatment options, min-mod task modification necessary    Comorbidities Affecting Occupational Performance: Presence of comorbidities impacting occupational performance    Modification or Assistance to Complete Evaluation  Min-Moderate modification of tasks or assist with assess necessary to complete eval    OT Frequency 2x / week   May reduce frequency to 1x/week depending on progress   OT Duration 6 weeks    OT Treatment/Interventions Aquatic Therapy;Self-care/ADL training;DME and/or AE instruction;Balance training;Therapeutic activities;Cognitive remediation/compensation;Therapeutic exercise;Neuromuscular education;Functional Mobility Training;Visual/perceptual remediation/compensation;Patient/family education;Manual Therapy;Energy conservation;Electrical Stimulation    Plan Environmental scanning and higher level coordination/balance; re-assess 9HPT?    Consulted and Agree with Plan of Care Patient;Family member/caregiver    Family Member Consulted Lelon Frohlich (wife)             Patient will benefit from skilled therapeutic intervention in order to improve the following deficits and impairments:   Body Structure / Function / Physical Skills: ADL, Decreased knowledge of use of DME, Gait, Strength, Balance, Dexterity, GMC, UE functional use, Body mechanics, Hearing, Cardiopulmonary status limiting activity, Endurance,  IADL, ROM, Vision, Coordination, Mobility, Sensation, FMC, Decreased knowledge of precautions Cognitive Skills: Attention, Memory, Problem Solve, Safety Awareness, Sequencing Psychosocial Skills: Environmental  Adaptations   Visit Diagnosis: Other lack of coordination  Unspecified symptoms and signs involving cognitive functions following cerebral infarction  Muscle weakness (generalized)  Visuospatial deficit    Problem List Patient Active Problem List   Diagnosis Date Noted   Cerebral thrombosis with cerebral infarction 11/25/2020   Acute right-sided weakness 11/24/2020   History of mitral valve repair 11/26/2018   Prosthetic valve dysfunction 11/25/2018   Ectatic abdominal aorta ([place]) 11/25/2018   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 [place], [name], [address] Phone: [phone]   Fax:  [phone]  Name: Eric [name] MRN: [id number] Date of Birth: 02-05-1930

## 2020-12-20 ENCOUNTER — Encounter: Payer: Self-pay | Admitting: Physical Therapy

## 2020-12-20 ENCOUNTER — Ambulatory Visit: Payer: Medicare Other | Attending: Internal Medicine

## 2020-12-20 ENCOUNTER — Other Ambulatory Visit: Payer: Self-pay

## 2020-12-20 ENCOUNTER — Ambulatory Visit: Payer: Medicare Other | Admitting: Occupational Therapy

## 2020-12-20 ENCOUNTER — Ambulatory Visit: Payer: Medicare Other | Attending: Internal Medicine | Admitting: Physical Therapy

## 2020-12-20 ENCOUNTER — Other Ambulatory Visit (HOSPITAL_BASED_OUTPATIENT_CLINIC_OR_DEPARTMENT_OTHER): Payer: Self-pay

## 2020-12-20 DIAGNOSIS — R262 Difficulty in walking, not elsewhere classified: Secondary | ICD-10-CM

## 2020-12-20 DIAGNOSIS — M6281 Muscle weakness (generalized): Secondary | ICD-10-CM

## 2020-12-20 DIAGNOSIS — Z23 Encounter for immunization: Secondary | ICD-10-CM

## 2020-12-20 DIAGNOSIS — R278 Other lack of coordination: Secondary | ICD-10-CM | POA: Insufficient documentation

## 2020-12-20 DIAGNOSIS — I69319 Unspecified symptoms and signs involving cognitive functions following cerebral infarction: Secondary | ICD-10-CM | POA: Insufficient documentation

## 2020-12-20 DIAGNOSIS — R2689 Other abnormalities of gait and mobility: Secondary | ICD-10-CM

## 2020-12-20 DIAGNOSIS — R41842 Visuospatial deficit: Secondary | ICD-10-CM | POA: Insufficient documentation

## 2020-12-20 MED ORDER — PFIZER-BIONT COVID-19 VAC-TRIS 30 MCG/0.3ML IM SUSP
INTRAMUSCULAR | 0 refills | Status: DC
  Filled 2020-12-20: qty 0.3, 1d supply, fill #0

## 2020-12-20 NOTE — Therapy (Signed)
[place]. [place], [name], [address] Phone: [phone]   Fax:  [phone]  Physical Therapy Treatment  Patient Details  Name: Eric [name] MRN: [id number] Date of Birth: Oct 11, 1929 Referring Provider (PT): Debbe Odea   Encounter Date: 12/20/2020   PT End of Session - 12/20/20 1434     Visit Number 3    Date for PT Re-Evaluation 03/09/21    Authorization Type [employer] Medicare    PT Start Time [id number]    PT Stop Time [id number]    PT Time Calculation (min) 45 min    Activity Tolerance Patient tolerated treatment well    Behavior During Therapy [facility] for tasks assessed/performed             Past Medical History:  Diagnosis Date   Atrial flutter, paroxysmal ([place])    a. dx 04-02-2014, s/p  successful cardioversion 04-06-2014.   Chronic anticoagulation    on Eliquis--  due to recurrent dvt's and pe's   Dyslipidemia    History of bladder cancer urologist-  dr [name]   02-13-2016  s/p TURBT per path high grade papillary urothelial carcinoma   History of DVT (deep vein thrombosis)    2000-- RLE   History of melanoma excision    left flank;  07/ 2014 left lower leg and right upper chest   History of prostate cancer    Gleason 6--  s/p  radical prostatectomy 06/ 2000 in [place]---  no recurrence   History of pulmonary embolus (PE)    2000 & 2005  bilateral   Hx of valvuloplasty 06/19/2007   a. 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 05/2007 CABG x 1: s/p SVG-OM;  b. 10/2010 Ex MV: EF 72%, inf attenuation w/o ischemia, brief run of PAT with exercise. To   Thrombocytopenia ([place])    Thyroid goiter 2013   nodular   Wears hearing aid    BILATERAL   Wears partial  dentures    UPPER    Past Surgical History:  Procedure Laterality Date   AORTIC VALVE REPLACEMENT (AVR)/CORONARY ARTERY BYPASS GRAFTING (CABG)  06-19-2007  dr [name]   SVG to OM1 ;   AVR w/ #23 Mitralflow pericardial and ligation left atrial appendage;  MV repair w/ #28 Sorin 3-D memo Ring Annuloplasty with repair ruptured chordar of P-2 flail segments   CARDIAC CATHETERIZATION  05-12-2007  dr Leonia Reeves   single vessel 30% ostial LAD,  80% LCx;  severe to critial AS,  moderate MR,  normal LVF, ef 70%,  normal right heart pressures and cardiac outputs,  mild elevated LV end-diastolic pressure     CARDIOVASCULAR STRESS TEST  11-02-2010  dr Irish Lack   normal nuclear study w/ no ischemia or infarct/scar/  normal LV function and wall motion , ef 72%   CARDIOVERSION N/A 04/06/2014   Procedure: CARDIOVERSION;  Surgeon: [name], MD;  Location: [place];  Service: Cardiovascular;  Laterality: N/A;   successful   CATARACT EXTRACTION W/ INTRAOCULAR LENS  IMPLANT, BILATERAL  2012  approx   CORONARY ARTERY BYPASS GRAFT     RETROPUBIC RADICAL PROSTATECTOMY  06/ 2000  in [place]   RIGHT/LEFT HEART CATH AND CORONARY/GRAFT  ANGIOGRAPHY N/A 10/29/2018   Procedure: RIGHT/LEFT HEART CATH AND CORONARY/GRAFT ANGIOGRAPHY;  Surgeon: [name], MD;  Location: [place] CV LAB;  Service: Cardiovascular;  Laterality: N/A;   ROTATOR CUFF REPAIR Left 09/1998   TEE WITHOUT CARDIOVERSION N/A 04/06/2014   Procedure: TRANSESOPHAGEAL ECHOCARDIOGRAM (TEE);  Surgeon: [name], MD;  Location: [facility] ENDOSCOPY;  Service: Cardiovascular;  Laterality: N/A;  mild focal basa LVH of the septum, ef 50-55%, s/p AV annuloplasty ring, elongated chordae, thickened leaflets, mild to mod. MR, multiple small jets/arotic bioprosthetic valve sits well in position, mild AI/ thickened TV w/ mild reurg./mild LA   TEE WITHOUT CARDIOVERSION N/A 10/22/2018   Procedure: TRANSESOPHAGEAL ECHOCARDIOGRAM (TEE);  Surgeon: [name], MD;  Location: [facility] ENDOSCOPY;  Service: Cardiovascular;  Laterality: N/A;   TEE WITHOUT CARDIOVERSION N/A 11/25/2018   Procedure: TRANSESOPHAGEAL ECHOCARDIOGRAM (TEE);  Surgeon: [name], MD;  Location: [place];  Service: Open Heart Surgery;  Laterality: N/A;   TRANSCATHETER AORTIC VALVE REPLACEMENT, TRANSFEMORAL N/A 11/25/2018   Procedure: TRANSCATHETER AORTIC VALVE REPLACEMENT, TRANSFEMORAL;  Surgeon: [name], MD;  Location: [place];  Service: Open Heart Surgery;  Laterality: N/A;   TRANSTHORACIC ECHOCARDIOGRAM  03-23-2016   dr Irish Lack   EF 55-60%, reduced contribution atrial contraction to ventricular filling, due to increased ventricular diastolic pressure or atrial contratile dysfunction/  bioprosthetic AV w/ mod. regurg. (valve area 1.97cm^2)/ mild dilated ascending aorta/ mild thicken MV normal function annular ring prosthesis/severe LAE & mod. to sev.RAE/ PASP 54mHg/ mild TR/ ventricle septal motion show paradox   TRANSURETHRAL RESECTION OF BLADDER TUMOR N/A 02/13/2016   Procedure: TRANSURETHRAL RESECTION OF BLADDER TUMOR (TURBT);  Surgeon: [name] MD;  [facility]  Service: Urology;  Laterality: N/A;   TRANSURETHRAL RESECTION OF BLADDER TUMOR N/A 09/04/2016   Procedure: TRANSURETHRAL RESECTION OF BLADDER TUMOR (TURBT);  Surgeon: [name] MD;  [facility]  Service: Urology;  Laterality: N/A;    There were no vitals filed for this visit.   Subjective Assessment - 12/20/20 1318     Subjective Patient reports a fall on Sunday night, he reports that he got off balance and fell into the dresser.  When I questioned him about night lights he said he did not have them.  We discussed safety and fall prevention with night light closet light to give a point of reference.  He denies injury    Currently in Pain? No/denies                               [facility]  [facility]20 0001       High Level Balance   High Level Balance Activities Backward walking;Negotitating around obstacles;Negotiating over obstacles    High Level Balance Comments walking ball toss, 6" step toe touches, then tried on the airex, on airex ball toss, ball kicks      Knee/Hip Exercises: Aerobic   Recumbent Bike level 3 x 5 minutes    Nustep level 5 x 6 minutes      Knee/Hip Exercises: Machines for Strengthening   Cybex Knee Extension 10# 2x10    Cybex Knee Flexion 25# 2x10    Cybex Leg Press 20# 2x10      Knee/Hip Exercises: Standing   Walking with Sports Cord 30# all directins 4x each  PT Short Term Goals - 12/07/20 1701       PT SHORT TERM GOAL #1   Title Pt will be I and compliant with initial HEP.    Time 2    Period Weeks    Status New               PT Long Term Goals - 12/20/20 1436       PT LONG TERM GOAL #1   Title Pt will be I and compliant with HEP.    Status On-going      PT LONG TERM GOAL #2   Title Pt will improv BERG balance score 46/56 to show improved balance    Status On-going                   Plan - 12/20/20 1435     Clinical Impression Statement Patient really struggles with balance on dynamic surfaces, he had a fall in hes bedroom in the middle of the night going to the bathroom over the weekend, he reports no nightlights and on carpet.  We did some problem solving with this.  Tends to fall and lose balance to the back    PT Next Visit Plan be careful with the balance tends to lose balance    Consulted and Agree with Plan of Care Patient             Patient will benefit from skilled therapeutic intervention in order to improve the following deficits and impairments:  Abnormal gait, Decreased coordination, Difficulty walking, Cardiopulmonary status limiting activity, Decreased endurance, Decreased activity tolerance, Decreased balance, Postural dysfunction, Decreased strength, Decreased  mobility  Visit Diagnosis: Muscle weakness (generalized)  Difficulty in walking, not elsewhere classified  Other abnormalities of gait and mobility     Problem List Patient Active Problem List   Diagnosis Date Noted   Cerebral thrombosis with cerebral infarction 11/25/2020   Acute right-sided weakness 11/24/2020   History of mitral valve repair 11/26/2018   Prosthetic valve dysfunction 11/25/2018   Ectatic abdominal aorta ([place]) 11/25/2018   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 [place], [name], [address] Phone: [phone]   Fax:  [phone]  Name: Eric [name] MRN: [id number] Date of Birth: 13-Feb-1930

## 2020-12-20 NOTE — Progress Notes (Signed)
   Covid-19 Vaccination Clinic  Name:  Eric [name]    MRN: [id number] DOB: 1930/04/04  12/20/2020  Eric [name] was observed post Covid-19 immunization for 15 minutes without incident. He was provided with Vaccine Information Sheet and instruction to access the V-Safe system.   Eric [name] was instructed to call 911 with any severe reactions post vaccine: Difficulty breathing  Swelling of face and throat  A fast heartbeat  A bad rash all over body  Dizziness and weakness   Immunizations Administered     Name Date Dose VIS Date Route   PFIZER Comrnaty(Gray TOP) Covid-19 Vaccine 12/20/2020 10:44 AM 0.3 mL 04/28/2020 Intramuscular   Manufacturer: [employer]   Lot: [id number]   [place]: [phone]

## 2020-12-21 NOTE — Therapy (Signed)
[place]. [place], [name], [address] Phone: [phone]   Fax:  [phone]  Occupational Therapy Treatment  Patient Details  Name: Eric [name] MRN: [id number] Date of Birth: 03-30-1930 Referring Provider (OT): Debbe Odea, MD   Encounter Date: 12/20/2020   OT End of Session - 12/20/20 1407     Visit Number 4    Number of Visits 13    Date for OT Re-Evaluation 02/27/21    Authorization Type [employer] ($10 copay/day)    Authorization Time Period VL: MN    Progress Note Due on Visit 10    OT Start Time 1400    OT Stop Time 1445    OT Time Calculation (min) 45 min    Activity Tolerance Patient tolerated treatment well    Behavior During Therapy [facility] for tasks assessed/performed             Past Medical History:  Diagnosis Date   Atrial flutter, paroxysmal ([place])    a. dx 04-02-2014, s/p  successful cardioversion 04-06-2014.   Chronic anticoagulation    on Eliquis--  due to recurrent dvt's and pe's   Dyslipidemia    History of bladder cancer urologist-  dr [name]   02-13-2016  s/p TURBT per path high grade papillary urothelial carcinoma   History of DVT (deep vein thrombosis)    2000-- RLE   History of melanoma excision    left flank;  07/ 2014 left lower leg and right upper chest   History of prostate cancer    Gleason 6--  s/p  radical prostatectomy 06/ 2000 in [place]---  no recurrence   History of pulmonary embolus (PE)    2000 & 2005  bilateral   Hx of valvuloplasty 06/19/2007   a. 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 05/2007 CABG x 1: s/p SVG-OM;  b. 10/2010 Ex MV: EF 72%, inf attenuation w/o ischemia, brief run of PAT  with exercise. To   Thrombocytopenia ([place])    Thyroid goiter 2013   nodular   Wears hearing aid    BILATERAL   Wears partial dentures    UPPER    Past Surgical History:  Procedure Laterality Date   AORTIC VALVE REPLACEMENT (AVR)/CORONARY ARTERY BYPASS GRAFTING (CABG)  06-19-2007  dr [name]   SVG to OM1 ;   AVR w/ #23 Mitralflow pericardial and ligation left atrial appendage;  MV repair w/ #28 Sorin 3-D memo Ring Annuloplasty with repair ruptured chordar of P-2 flail segments   CARDIAC CATHETERIZATION  05-12-2007  dr Leonia Reeves   single vessel 30% ostial LAD,  80% LCx;  severe to critial AS,  moderate MR,  normal LVF, ef 70%,  normal right heart pressures and cardiac outputs,  mild elevated LV end-diastolic pressure     CARDIOVASCULAR STRESS TEST  11-02-2010  dr Irish Lack   normal nuclear study w/ no ischemia or infarct/scar/  normal LV function and wall motion , ef 72%   CARDIOVERSION N/A 04/06/2014   Procedure: CARDIOVERSION;  Surgeon: [name], MD;  Location: [place];  Service: Cardiovascular;  Laterality: N/A;   successful   CATARACT EXTRACTION W/ INTRAOCULAR LENS  IMPLANT, BILATERAL  2012  approx   CORONARY ARTERY BYPASS GRAFT     RETROPUBIC RADICAL PROSTATECTOMY  06/ 2000  in [place], [place]   RIGHT/LEFT HEART CATH AND CORONARY/GRAFT ANGIOGRAPHY N/A 10/29/2018   Procedure: RIGHT/LEFT HEART CATH AND CORONARY/GRAFT ANGIOGRAPHY;  Surgeon: [name], MD;  Location: [place] CV LAB;  Service: Cardiovascular;  Laterality: N/A;   ROTATOR CUFF REPAIR Left 09/1998   TEE WITHOUT CARDIOVERSION N/A 04/06/2014   Procedure: TRANSESOPHAGEAL ECHOCARDIOGRAM (TEE);  Surgeon: [name], MD;  Location: [facility] ENDOSCOPY;  Service: Cardiovascular;  Laterality: N/A;  mild focal basa LVH of the septum, ef 50-55%, s/p AV annuloplasty ring, elongated chordae, thickened leaflets, mild to mod. MR, multiple small jets/arotic bioprosthetic valve sits well in position, mild AI/ thickened TV w/  mild reurg./mild LA   TEE WITHOUT CARDIOVERSION N/A 10/22/2018   Procedure: TRANSESOPHAGEAL ECHOCARDIOGRAM (TEE);  Surgeon: [name], MD;  Location[facility] END[facility];  Service: Cardiovascular;  Laterality: N/A;   TEE WITHOUT CARDIOVERSION N/A 11/25/2018   Procedure: TRANSESOPHAGEAL ECHOCARDIOGRAM (TEE);  Surgeon: [name], MD;  Location: [place];  Service: Open Heart Surgery;  Laterality: N/A;   TRANSCATHETER AORTIC VALVE REPLACEMENT, TRANSFEMORAL N/A 11/25/2018   Procedure: TRANSCATHETER AORTIC VALVE REPLACEMENT, TRANSFEMORAL;  Surgeon: [name], MD;  Location: [place];  Service: Open Heart Surgery;  Laterality: N/A;   TRANSTHORACIC ECHOCARDIOGRAM  03-23-2016   dr Irish Lack   EF 55-60%, reduced contribution atrial contraction to ventricular filling, due to increased ventricular diastolic pressure or atrial contratile dysfunction/  bioprosthetic AV w/ mod. regurg. (valve area 1.97cm^2)/ mild dilated ascending aorta/ mild thicken MV normal function annular ring prosthesis/severe LAE & mod. to sev.RAE/ PASP 63mHg/ mild TR/ ventricle septal motion show paradox   TRANSURETHRAL RESECTION OF BLADDER TUMOR N/A 02/13/2016   Procedure: TRANSURETHRAL RESECTION OF BLADDER TUMOR (TURBT);  Surgeon: [name] MD;  [facility]  Service: Urology;  Laterality: N/A;   TRANSURETHRAL RESECTION OF BLADDER TUMOR N/A 09/04/2016   Procedure: TRANSURETHRAL RESECTION OF BLADDER TUMOR (TURBT);  Surgeon: [name] MD;  [facility]  Service: Urology;  Laterality: N/A;    There were no vitals filed for this visit.   Subjective Assessment - 12/20/20 1403     Subjective  Pt reports he fell on Sunday getting out of bed to use the restroom and since then has been leaving a light on in the hallway    Pertinent History Lt frontal centrum semiovale infarct 2/2 to small vessel disease (onset 11/23/20), DVT/PE, paroxysmal a-fib on Eliquis, aortic valve  replacement (x2) w/ bioprosthetic cardiac valve, mitral ring annuloplasty, TAVR, CAD, Lt rotator cuff repair sx (May 2000), HLD    Currently in Pain? No/denies             Treatment/Exercises - 12/20/20    Environmental Scanning Ambulating around static environment identifying targets with 80% accuracy on first pass. Pt required min verbal and contextual cues for locating remaining 2/10 targets on second pass.    Trail Making Trail-making activity, finding and erasing numbers in sequential order completed on window to facilitate visual, depth, and figure-ground perception. Also completed set alternating between numbers and letters. Pt positioned in standing to continue to improve functional balance.     Pegboard Activity Using resistive clothespin (4#) to retrieve/place marbles on easy-grip pegs while simultaneously naming object from category corresponding to identified peg color (e.g., orange peg - animal, green - food); pt completed activity w/ 80% success, requiring verbal cue to self-correct  errors. Completed exercise while standing for balance/generalized endurance.              OT Education - 12/20/20 1431     Education Details Reviewed fall prevention and considerations for continued safety w/ in-home mobility    Person(s) Educated Patient    Methods Explanation    Comprehension Verbalized understanding             OT Short Term Goals - 12/20/20 1411       OT SHORT TERM GOAL #1   Title Pt will verbalize understanding of memory compensatory strategies to improve safety at home    Baseline Recent limitations w/ memory    Time 3    Period Weeks    Status Achieved   12/13/20   Target Date 12/23/20      OT SHORT TERM GOAL #2   Title Pt will identify at least 7/10 targets during environmental scanning activity w/out assist to improve safety w/ functional mobility    Time 3    Period Weeks    Status Achieved   12/20/20 - located 8/10 targets independently     OT SHORT  TERM GOAL #3   Title Pt will improve coordination as evidenced by decreasing 9-HPT time by at least 5 sec w/ R, dominant hand    Baseline R hand 48 sec; L hand 36 sec    Time 3    Period Weeks    Status On-going   12/20/20 - RUE 42     OT SHORT TERM GOAL #4   Title Pt will demonstrate understanding of compensatory strategies, including use of AE, to implement during BADLs/IADLs to increase safety and independence prn    Baseline Decreased knowledge of compensatory strategies    Time 3    Period Weeks    Status Achieved   12/20/20 - pt reports he is satsfied w/ participation in functional activities at this time            [place] - 12/07/20 1537       OT LONG TERM GOAL #1   Title Pt will be independent w/ HEP designed for [facility] and coordination by discharge    Baseline No HEP at this time    Time 6    Period Weeks    Status On-going      OT LONG TERM GOAL #2   Title Pt will improve coordination as evidenced by decreasing 9-HPT time by at least 9 sec w/ R, dominant hand    Baseline R hand 48 sec; L hand 36 sec    Time 6    Period Weeks    Status On-going      OT LONG TERM GOAL #3   Title Pt will write a sentence w/ at least 75% legibility and sizing in pt-determined acceptable level of time, using AE prn    Baseline Pt reports micrographia and increased time; pt keeps a written blog    Time 6    Period Weeks    Status On-going      OT LONG TERM GOAL #4   Title Pt will be able to sequence through an IADL task (e.g., med mgmt, housekeeping) w/ Mod I    Baseline Decreased organization of thought and sequencing    Time 6    Period Weeks    Status On-going             Plan - 12/20/20 1433     Clinical Impression  Statement Pt continues to make good progress toward goals. OT incorporated higher level balance, cognition, and environmental scanning skills into activities this session w/ pt demo'ing min difficulty w/ alternating attention and environmental scanning  tasks, requiring occaional verbal cues for success.    OT Occupational Profile and History Detailed Assessment- Review of Records and additional review of physical, cognitive, psychosocial history related to current functional performance    Occupational performance deficits (Please refer to evaluation for details): ADL's;IADL's;Leisure;Social Participation    Body Structure / Function / Physical Skills ADL;Decreased knowledge of use of DME;Gait;Strength;Balance;Dexterity;GMC;UE functional use;Body mechanics;Hearing;Cardiopulmonary status limiting activity;Endurance;IADL;ROM;Vision;Coordination;Mobility;Sensation;FMC;Decreased knowledge of precautions    Cognitive Skills Attention;Memory;Problem Solve;Safety Awareness;Sequencing    Psychosocial Skills Environmental  Adaptations    Rehab Potential Good    Clinical Decision Making Several treatment options, min-mod task modification necessary    Comorbidities Affecting Occupational Performance: Presence of comorbidities impacting occupational performance    Modification or Assistance to Complete Evaluation  Min-Moderate modification of tasks or assist with assess necessary to complete eval    OT Frequency 2x / week   May reduce frequency to 1x/week depending on progress   OT Duration 6 weeks    OT Treatment/Interventions Aquatic Therapy;Self-care/ADL training;DME and/or AE instruction;Balance training;Therapeutic activities;Cognitive remediation/compensation;Therapeutic exercise;Neuromuscular education;Functional Mobility Training;Visual/perceptual remediation/compensation;Patient/family education;Manual Therapy;Energy conservation;Electrical Stimulation    Plan Continue to address higher level FMC/dexterity and alternating attention (catch and name, scavenger hunt)    Consulted and Agree with Plan of Care Patient;Family member/caregiver    Family Member Consulted Lelon Frohlich (wife)             Patient will benefit from skilled therapeutic intervention  in order to improve the following deficits and impairments:   Body Structure / Function / Physical Skills: ADL, Decreased knowledge of use of DME, Gait, Strength, Balance, Dexterity, GMC, UE functional use, Body mechanics, Hearing, Cardiopulmonary status limiting activity, Endurance, IADL, ROM, Vision, Coordination, Mobility, Sensation, FMC, Decreased knowledge of precautions Cognitive Skills: Attention, Memory, Problem Solve, Safety Awareness, Sequencing Psychosocial Skills: Environmental  Adaptations   Visit Diagnosis: Other lack of coordination  Visuospatial deficit  Muscle weakness (generalized)  Unspecified symptoms and signs involving cognitive functions following cerebral infarction    Problem List Patient Active Problem List   Diagnosis Date Noted   Cerebral thrombosis with cerebral infarction 11/25/2020   Acute right-sided weakness 11/24/2020   History of mitral valve repair 11/26/2018   Prosthetic valve dysfunction 11/25/2018   Ectatic abdominal aorta ([place]) 11/25/2018   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 [place], [name], [address] Phone: [phone]   Fax:  [phone]  Name: Eric [name] MRN: [id number] Date of Birth: May 02, 1930

## 2020-12-22 ENCOUNTER — Ambulatory Visit: Payer: Medicare Other | Admitting: Occupational Therapy

## 2020-12-22 ENCOUNTER — Other Ambulatory Visit: Payer: Self-pay

## 2020-12-22 ENCOUNTER — Encounter: Payer: Self-pay | Admitting: Physical Therapy

## 2020-12-22 ENCOUNTER — Ambulatory Visit: Payer: Medicare Other | Admitting: Physical Therapy

## 2020-12-22 DIAGNOSIS — M6281 Muscle weakness (generalized): Secondary | ICD-10-CM

## 2020-12-22 DIAGNOSIS — R262 Difficulty in walking, not elsewhere classified: Secondary | ICD-10-CM

## 2020-12-22 NOTE — Therapy (Signed)
[address] [place], [name], [address] Phone: [phone]   Fax:  [phone]  Physical Therapy Treatment  Patient Details  Name: Eric [name] MRN: [id number] Date of Birth: [birth year] Referring Provider (PT): Debbe Odea   Encounter Date: 12/22/2020   PT End of Session - 12/22/20 1011     Visit Number 4    Date for PT Re-Evaluation 03/09/21    Authorization Type [employer] Medicare    PT Start Time 0930    PT Stop Time [id number]    PT Time Calculation (min) 44 min    Activity Tolerance Patient tolerated treatment well    Behavior During Therapy [facility] for tasks assessed/performed             Past Medical History:  Diagnosis Date   Atrial flutter, paroxysmal ([place])    a. dx 04-02-2014, s/p  successful cardioversion 04-06-2014.   Chronic anticoagulation    on Eliquis--  due to recurrent dvt's and pe's   Dyslipidemia    History of bladder cancer urologist-  dr [name]   02-13-2016  s/p TURBT per path high grade papillary urothelial carcinoma   History of DVT (deep vein thrombosis)    2000-- RLE   History of melanoma excision    left flank;  07/ 2014 left lower leg and right upper chest   History of prostate cancer    Gleason 6--  s/p  radical prostatectomy 06/ 2000 in [place]---  no recurrence   History of pulmonary embolus (PE)    2000 & 2005  bilateral   Hx of valvuloplasty 06/19/2007   a. 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 05/2007 CABG x 1: s/p SVG-OM;  b. 10/2010 Ex MV: EF 72%, inf attenuation w/o ischemia, brief run of PAT with exercise. To   Thrombocytopenia ([place])    Thyroid goiter 2013   nodular   Wears hearing aid    BILATERAL   Wears partial  dentures    UPPER    Past Surgical History:  Procedure Laterality Date   AORTIC VALVE REPLACEMENT (AVR)/CORONARY ARTERY BYPASS GRAFTING (CABG)  06-19-2007  dr [name]   SVG to OM1 ;   AVR w/ #23 Mitralflow pericardial and ligation left atrial appendage;  MV repair w/ #28 Sorin 3-D memo Ring Annuloplasty with repair ruptured chordar of P-2 flail segments   CARDIAC CATHETERIZATION  05-12-2007  dr Leonia Reeves   single vessel 30% ostial LAD,  80% LCx;  severe to critial AS,  moderate MR,  normal LVF, ef 70%,  normal right heart pressures and cardiac outputs,  mild elevated LV end-diastolic pressure     CARDIOVASCULAR STRESS TEST  11-02-2010  dr Irish Lack   normal nuclear study w/ no ischemia or infarct/scar/  normal LV function and wall motion , ef 72%   CARDIOVERSION N/A 04/06/2014   Procedure: CARDIOVERSION;  Surgeon: [name], MD;  Location: [place];  Service: Cardiovascular;  Laterality: N/A;   successful   CATARACT EXTRACTION W/ INTRAOCULAR LENS  IMPLANT, BILATERAL  2012  approx   CORONARY ARTERY BYPASS GRAFT     RETROPUBIC RADICAL PROSTATECTOMY  06/ 2000  in [place]   RIGHT/LEFT HEART CATH AND CORONARY/GRAFT  ANGIOGRAPHY N/A 10/29/2018   Procedure: RIGHT/LEFT HEART CATH AND CORONARY/GRAFT ANGIOGRAPHY;  Surgeon: [name], MD;  Location: [place] [facility];  Service: Cardiovascular;  Laterality: N/A;   ROTATOR CUFF REPAIR Left 09/1998   TEE WITHOUT CARDIOVERSION N/A 04/06/2014   Procedure: TRANSESOPHAGEAL ECHOCARDIOGRAM (TEE);  Surgeon: [name], MD;  [facility] END[facility];  Service: Cardiovascular;  Laterality: N/A;  mild focal basa LVH of the septum, ef 50-55%, s/p AV annuloplasty ring, elongated chordae, thickened leaflets, mild to mod. MR, multiple small jets/arotic bioprosthetic valve sits well in position, mild AI/ thickened TV w/ mild reurg./mild LA   TEE WITHOUT CARDIOVERSION N/A 10/22/2018   Procedure: TRANSESOPHAGEAL ECHOCARDIOGRAM (TEE);  Surgeon: [name], MD;  Location: [facility] ENDOSCOPY;  Service: Cardiovascular;  Laterality: N/A;   TEE WITHOUT CARDIOVERSION N/A 11/25/2018   Procedure: TRANSESOPHAGEAL ECHOCARDIOGRAM (TEE);  Surgeon: [name], MD;  Location: [place];  Service: Open Heart Surgery;  Laterality: N/A;   TRANSCATHETER AORTIC VALVE REPLACEMENT, TRANSFEMORAL N/A 11/25/2018   Procedure: TRANSCATHETER AORTIC VALVE REPLACEMENT, TRANSFEMORAL;  Surgeon: [name], MD;  Location: [place];  Service: Open Heart Surgery;  Laterality: N/A;   TRANSTHORACIC ECHOCARDIOGRAM  03-23-2016   dr Irish Lack   EF 55-60%, reduced contribution atrial contraction to ventricular filling, due to increased ventricular diastolic pressure or atrial contratile dysfunction/  bioprosthetic AV w/ mod. regurg. (valve area 1.97cm^2)/ mild dilated ascending aorta/ mild thicken MV normal function annular ring prosthesis/severe LAE & mod. to sev.RAE/ PASP 52mHg/ mild TR/ ventricle septal motion show paradox   TRANSURETHRAL RESECTION OF BLADDER TUMOR N/A 02/13/2016   Procedure: TRANSURETHRAL RESECTION OF BLADDER TUMOR (TURBT);  Surgeon: [name] MD;  [facility]  Service: Urology;  Laterality: N/A;   TRANSURETHRAL RESECTION OF BLADDER TUMOR N/A 09/04/2016   Procedure: TRANSURETHRAL RESECTION OF BLADDER TUMOR (TURBT);  Surgeon: [name] MD;  [facility]  Service: Urology;  Laterality: N/A;    There were no vitals filed for this visit.   Subjective Assessment - 12/22/20 0932     Subjective Feeling ok, no falls since last session    Currently in Pain? No/denies                               [facility]/Exercise - 12/22/20 0001       High Level Balance   High Level Balance Comments on airex eyes closed 10''      Knee/Hip Exercises: Aerobic   Recumbent Bike level 3 x 5 minutes    Nustep level 5 x 5 minutes      Knee/Hip Exercises: Machines for Strengthening    Cybex Knee Extension 10# 2x12    Cybex Knee Flexion 25# 2x12      Knee/Hip Exercises: Standing   Walking with Sports Cord 30# all directins 4x each    Other Standing Knee Exercises Alt 8in box taps x10 then from Airex to 8in box x10      Knee/Hip Exercises: Seated   Sit to Sand 2 sets;10 reps;without UE support   LE on airex, mat elevated                     PT Short Term Goals - 12/07/20 1701       PT SHORT TERM GOAL #1   Title Pt will be I and compliant with initial HEP.    Time 2  Period Weeks    Status New               PT Long Term Goals - 12/20/20 1436       PT LONG TERM GOAL #1   Title Pt will be I and compliant with HEP.    Status On-going      PT LONG TERM GOAL #2   Title Pt will improv BERG balance score 46/56 to show improved balance    Status On-going                   Plan - 12/22/20 1012     Clinical Impression Statement Pt continues to struggle with balance on non compliant surface. CGA needed with alt box taps on airex. Cues not to drag RLE with resisted backwards walking. Cs for anterior weight shift needed with sit to stands so LE would not push against mat table. Increase reps tolerated with leg curls and extensions.    Stability/Clinical Decision Making Evolving/Moderate complexity    Rehab Potential Good    PT Frequency 2x / week    PT Treatment/Interventions ADLs/Self Care Home Management;Gait training;Neuromuscular re-education;Balance training;Therapeutic exercise;Therapeutic activities;Functional mobility training;Stair training;Patient/family education;Manual techniques    PT Next Visit Plan be careful with the balance tends to lose balance, Balance on airex             Patient will benefit from skilled therapeutic intervention in order to improve the following deficits and impairments:  Abnormal gait, Decreased coordination, Difficulty walking, Cardiopulmonary status limiting activity, Decreased endurance,  Decreased activity tolerance, Decreased balance, Postural dysfunction, Decreased strength, Decreased mobility  Visit Diagnosis: Muscle weakness (generalized)  Difficulty in walking, not elsewhere classified     Problem List Patient Active Problem List   Diagnosis Date Noted   Cerebral thrombosis with cerebral infarction 11/25/2020   Acute right-sided weakness 11/24/2020   History of mitral valve repair 11/26/2018   Prosthetic valve dysfunction 11/25/2018   Ectatic abdominal aorta ([place]) 11/25/2018   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 [place], [name], [address] Phone: [phone]   Fax:  [phone]  Name: Eric [name] MRN: [id number] Date of Birth: [birth year]

## 2020-12-27 ENCOUNTER — Ambulatory Visit: Payer: Medicare Other | Admitting: Occupational Therapy

## 2020-12-27 ENCOUNTER — Other Ambulatory Visit: Payer: Self-pay

## 2020-12-27 ENCOUNTER — Encounter: Payer: Self-pay | Admitting: Occupational Therapy

## 2020-12-27 ENCOUNTER — Ambulatory Visit: Payer: Medicare Other | Admitting: Physical Therapy

## 2020-12-27 ENCOUNTER — Encounter: Payer: Self-pay | Admitting: Physical Therapy

## 2020-12-27 DIAGNOSIS — M6281 Muscle weakness (generalized): Secondary | ICD-10-CM | POA: Diagnosis not present

## 2020-12-27 DIAGNOSIS — I69319 Unspecified symptoms and signs involving cognitive functions following cerebral infarction: Secondary | ICD-10-CM

## 2020-12-27 DIAGNOSIS — R41842 Visuospatial deficit: Secondary | ICD-10-CM

## 2020-12-27 DIAGNOSIS — R278 Other lack of coordination: Secondary | ICD-10-CM

## 2020-12-27 DIAGNOSIS — R262 Difficulty in walking, not elsewhere classified: Secondary | ICD-10-CM

## 2020-12-27 NOTE — Therapy (Signed)
[address]. [place], [name], [address] Phone: [phone]   Fax:  [phone]  Physical Therapy Treatment  Patient Details  Name: Eric [name] MRN: [id number] Date of Birth: [birth year] Referring Provider (PT): Debbe Odea   Encounter Date: 12/27/2020   PT End of Session - 12/27/20 1707     Visit Number 5    Date for PT Re-Evaluation 03/09/21    Authorization Type UHC Medicare    PT Start Time [id number]    PT Stop Time 1700    PT Time Calculation (min) 46 min    Activity Tolerance Patient tolerated treatment well    Behavior During Therapy [facility] for tasks assessed/performed             Past Medical History:  Diagnosis Date   Atrial flutter, paroxysmal ([place])    a. dx 04-02-2014, s/p  successful cardioversion 04-06-2014.   Chronic anticoagulation    on Eliquis--  due to recurrent dvt's and pe's   Dyslipidemia    History of bladder cancer urologist-  dr [name]   02-13-2016  s/p TURBT per path high grade papillary urothelial carcinoma   History of DVT (deep vein thrombosis)    2000-- RLE   History of melanoma excision    left flank;  07/ 2014 left lower leg and right upper chest   History of prostate cancer    Gleason 6--  s/p  radical prostatectomy 06/ 2000 in [place]---  no recurrence   History of pulmonary embolus (PE)    2000 & 2005  bilateral   Hx of valvuloplasty 06/19/2007   a. 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 05/2007 CABG x 1: s/p SVG-OM;  b. 10/2010 Ex MV: EF 72%, inf attenuation w/o ischemia, brief run of PAT with exercise. To   Thrombocytopenia ([place])    Thyroid goiter 2013   nodular   Wears hearing aid    BILATERAL   Wears partial  dentures    UPPER    Past Surgical History:  Procedure Laterality Date   AORTIC VALVE REPLACEMENT (AVR)/CORONARY ARTERY BYPASS GRAFTING (CABG)  06-19-2007  dr [name]   SVG to OM1 ;   AVR w/ #23 Mitralflow pericardial and ligation left atrial appendage;  MV repair w/ #28 Sorin 3-D memo Ring Annuloplasty with repair ruptured chordar of P-2 flail segments   CARDIAC CATHETERIZATION  05-12-2007  dr Leonia Reeves   single vessel 30% ostial LAD,  80% LCx;  severe to critial AS,  moderate MR,  normal LVF, ef 70%,  normal right heart pressures and cardiac outputs,  mild elevated LV end-diastolic pressure     CARDIOVASCULAR STRESS TEST  11-02-2010  dr Irish Lack   normal nuclear study w/ no ischemia or infarct/scar/  normal LV function and wall motion , ef 72%   CARDIOVERSION N/A 04/06/2014   Procedure: CARDIOVERSION;  Surgeon: [name], MD;  Location: [place];  Service: Cardiovascular;  Laterality: N/A;   successful   CATARACT EXTRACTION W/ INTRAOCULAR LENS  IMPLANT, BILATERAL  2012  approx   CORONARY ARTERY BYPASS GRAFT     RETROPUBIC RADICAL PROSTATECTOMY  06/ 2000  in [place]   RIGHT/LEFT HEART CATH AND CORONARY/GRAFT  ANGIOGRAPHY N/A 10/29/2018   Procedure: RIGHT/LEFT HEART CATH AND CORONARY/GRAFT ANGIOGRAPHY;  Surgeon: [name], MD;  Location: [place] [facility];  Service: Cardiovascular;  Laterality: N/A;   ROTATOR CUFF REPAIR Left 09/1998   TEE WITHOUT CARDIOVERSION N/A 04/06/2014   Procedure: TRANSESOPHAGEAL ECHOCARDIOGRAM (TEE);  Surgeon: [name], MD;  Location: [facility] ENDOSCOPY;  Service: Cardiovascular;  Laterality: N/A;  mild focal basa LVH of the septum, ef 50-55%, s/p AV annuloplasty ring, elongated chordae, thickened leaflets, mild to mod. MR, multiple small jets/arotic bioprosthetic valve sits well in position, mild AI/ thickened TV w/ mild reurg./mild LA   TEE WITHOUT CARDIOVERSION N/A 10/22/2018   Procedure: TRANSESOPHAGEAL ECHOCARDIOGRAM (TEE);  Surgeon: [name], MD;  Location: [facility] ENDOSCOPY;  Service: Cardiovascular;  Laterality: N/A;   TEE WITHOUT CARDIOVERSION N/A 11/25/2018   Procedure: TRANSESOPHAGEAL ECHOCARDIOGRAM (TEE);  Surgeon: [name], MD;  Location: [place];  Service: Open Heart Surgery;  Laterality: N/A;   TRANSCATHETER AORTIC VALVE REPLACEMENT, TRANSFEMORAL N/A 11/25/2018   Procedure: TRANSCATHETER AORTIC VALVE REPLACEMENT, TRANSFEMORAL;  Surgeon: [name], MD;  Location: [place];  Service: Open Heart Surgery;  Laterality: N/A;   TRANSTHORACIC ECHOCARDIOGRAM  03-23-2016   dr Irish Lack   EF 55-60%, reduced contribution atrial contraction to ventricular filling, due to increased ventricular diastolic pressure or atrial contratile dysfunction/  bioprosthetic AV w/ mod. regurg. (valve area 1.97cm^2)/ mild dilated ascending aorta/ mild thicken MV normal function annular ring prosthesis/severe LAE & mod. to sev.RAE/ PASP 60mHg/ mild TR/ ventricle septal motion show paradox   TRANSURETHRAL RESECTION OF BLADDER TUMOR N/A 02/13/2016   Procedure: TRANSURETHRAL RESECTION OF BLADDER TUMOR (TURBT);  Surgeon: [name] MD;  [facility]  Service: Urology;  Laterality: N/A;   TRANSURETHRAL RESECTION OF BLADDER TUMOR N/A 09/04/2016   Procedure: TRANSURETHRAL RESECTION OF BLADDER TUMOR (TURBT);  Surgeon: [name] MD;  [facility]  Service: Urology;  Laterality: N/A;    There were no vitals filed for this visit.   Subjective Assessment - 12/27/20 1619     Subjective No falls    Currently in Pain? No/denies                               [facility] - 12/27/20 0001       High Level Balance   High Level Balance Activities Negotiating over obstacles;Negotitating around obstacles    High Level Balance Comments on the thick mat figure 8's, ovals and then did hand touching on cones and toe touches on cones.  Walking ball toss       Knee/Hip Exercises: Aerobic   Recumbent Bike level 3.5 x 5 minutes    Nustep level 5 x 5 minutes      Knee/Hip Exercises: Standing   Walking with Sports Cord 30# all directions 4x each                      PT Short Term Goals - 12/07/20 1701       PT SHORT TERM GOAL #1   Title Pt will be I and compliant with initial HEP.    Time 2    Period Weeks    Status New               PT Long Term Goals - 12/27/20 1716       PT LONG TERM GOAL #1  Title Pt will be I and compliant with HEP.    Status On-going      PT LONG TERM GOAL #3   Title decrease TUG time to 12 seconds    Status On-going                   Plan - 12/27/20 1707     Clinical Impression Statement Patient did better with the resisted gait, he still struggles on the dynamic surfaces, on the thicker mat today he was losing his balance to the posterior and needed mod A due to this, once he got his balance and with some verbal cueing he did better.  He will tend to shuffle his feet as well    PT Next Visit Plan be careful with the balance has difficulty on the dynamic surfaces    Consulted and Agree with Plan of Care Patient             Patient will benefit from skilled therapeutic intervention in order to improve the following deficits and impairments:  Abnormal gait, Decreased coordination, Difficulty walking, Cardiopulmonary status limiting activity, Decreased endurance, Decreased activity tolerance, Decreased balance, Postural dysfunction, Decreased strength, Decreased mobility  Visit Diagnosis: Muscle weakness (generalized)  Difficulty in walking, not elsewhere classified  Other lack of coordination     Problem List Patient Active Problem List   Diagnosis Date Noted   Cerebral thrombosis with cerebral infarction 11/25/2020   Acute right-sided weakness 11/24/2020   History of mitral valve repair 11/26/2018   Prosthetic valve dysfunction 11/25/2018   Ectatic abdominal aorta  ([place]) 11/25/2018   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 [place], [name], [address] Phone: [phone]   Fax:  [phone]  Name: Eric [name] MRN: [id number] Date of Birth: [birth year]

## 2020-12-28 NOTE — Therapy (Signed)
[address] [place], [name], [address] Phone: [phone]   Fax:  [phone]  Occupational Therapy Treatment  Patient Details  Name: Eric [name] MRN: [id number] Date of Birth: 1929/12/01 Referring Provider (OT): Debbe Odea, MD   Encounter Date: 12/27/2020   OT End of Session - 12/27/20 1540     Visit Number 5    Number of Visits 13    Date for OT Re-Evaluation 02/27/21    Authorization Type [employer] ($10 copay/day)    Authorization Time Period VL: [place]    Progress Note Due on Visit 10    OT Start Time 1536   pt arrival time   OT Stop Time 1615    OT Time Calculation (min) 39 min    Activity Tolerance Patient tolerated treatment well    Behavior During Therapy [facility] for tasks assessed/performed             Past Medical History:  Diagnosis Date   Atrial flutter, paroxysmal ([place])    a. dx 04-02-2014, s/p  successful cardioversion 04-06-2014.   Chronic anticoagulation    on Eliquis--  due to recurrent dvt's and pe's   Dyslipidemia    History of bladder cancer urologist-  dr [name]   02-13-2016  s/p TURBT per path high grade papillary urothelial carcinoma   History of DVT (deep vein thrombosis)    2000-- RLE   History of melanoma excision    left flank;  07/ 2014 left lower leg and right upper chest   History of prostate cancer    Gleason 6--  s/p  radical prostatectomy 06/ 2000 in [place]---  no recurrence   History of pulmonary embolus (PE)    2000 & 2005  bilateral   Hx of valvuloplasty 06/19/2007   a. 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 05/2007 CABG x 1: s/p SVG-OM;  b. 10/2010 Ex MV: EF 72%, inf attenuation w/o ischemia,  brief run of PAT with exercise. To   Thrombocytopenia ([place])    Thyroid goiter 2013   nodular   Wears hearing aid    BILATERAL   Wears partial dentures    UPPER    Past Surgical History:  Procedure Laterality Date   AORTIC VALVE REPLACEMENT (AVR)/CORONARY ARTERY BYPASS GRAFTING (CABG)  06-19-2007  dr [name]   SVG to OM1 ;   AVR w/ #23 Mitralflow pericardial and ligation left atrial appendage;  MV repair w/ #28 Sorin 3-D memo Ring Annuloplasty with repair ruptured chordar of P-2 flail segments   CARDIAC CATHETERIZATION  05-12-2007  dr Leonia Reeves   single vessel 30% ostial LAD,  80% LCx;  severe to critial AS,  moderate MR,  normal LVF, ef 70%,  normal right heart pressures and cardiac outputs,  mild elevated LV end-diastolic pressure     CARDIOVASCULAR STRESS TEST  11-02-2010  dr Irish Lack   normal nuclear study w/ no ischemia or infarct/scar/  normal LV function and wall motion , ef 72%   CARDIOVERSION N/A 04/06/2014   Procedure: CARDIOVERSION;  Surgeon: [name], MD;  Location: [place];  Service: Cardiovascular;  Laterality: N/A;   successful   CATARACT EXTRACTION W/ INTRAOCULAR LENS  IMPLANT,  BILATERAL  2012  approx   CORONARY ARTERY BYPASS GRAFT     RETROPUBIC RADICAL PROSTATECTOMY  06/ 2000  in [place], [place]   RIGHT/LEFT HEART CATH AND CORONARY/GRAFT ANGIOGRAPHY N/A 10/29/2018   Procedure: RIGHT/LEFT HEART CATH AND CORONARY/GRAFT ANGIOGRAPHY;  Surgeon: [name], MD;  Location: [place] CV LAB;  Service: Cardiovascular;  Laterality: N/A;   ROTATOR CUFF REPAIR Left 09/1998   TEE WITHOUT CARDIOVERSION N/A 04/06/2014   Procedure: TRANSESOPHAGEAL ECHOCARDIOGRAM (TEE);  Surgeon: [name], MD;  Location: [facility] ENDOSCOPY;  Service: Cardiovascular;  Laterality: N/A;  mild focal basa LVH of the septum, ef 50-55%, s/p AV annuloplasty ring, elongated chordae, thickened leaflets, mild to mod. MR, multiple small jets/arotic bioprosthetic valve sits well in position, mild AI/  thickened TV w/ mild reurg./mild LA   TEE WITHOUT CARDIOVERSION N/A 10/22/2018   Procedure: TRANSESOPHAGEAL ECHOCARDIOGRAM (TEE);  Surgeon: [name], MD;  Location[facility] END[facility];  Service: Cardiovascular;  Laterality: N/A;   TEE WITHOUT CARDIOVERSION N/A 11/25/2018   Procedure: TRANSESOPHAGEAL ECHOCARDIOGRAM (TEE);  Surgeon: [name], MD;  Location: [place];  Service: Open Heart Surgery;  Laterality: N/A;   TRANSCATHETER AORTIC VALVE REPLACEMENT, TRANSFEMORAL N/A 11/25/2018   Procedure: TRANSCATHETER AORTIC VALVE REPLACEMENT, TRANSFEMORAL;  Surgeon: [name], MD;  Location: [place];  Service: Open Heart Surgery;  Laterality: N/A;   TRANSTHORACIC ECHOCARDIOGRAM  03-23-2016   dr Irish Lack   EF 55-60%, reduced contribution atrial contraction to ventricular filling, due to increased ventricular diastolic pressure or atrial contratile dysfunction/  bioprosthetic AV w/ mod. regurg. (valve area 1.97cm^2)/ mild dilated ascending aorta/ mild thicken MV normal function annular ring prosthesis/severe LAE & mod. to sev.RAE/ PASP 75mHg/ mild TR/ ventricle septal motion show paradox   TRANSURETHRAL RESECTION OF BLADDER TUMOR N/A 02/13/2016   Procedure: TRANSURETHRAL RESECTION OF BLADDER TUMOR (TURBT);  Surgeon: [name] MD;  [facility]  Service: Urology;  Laterality: N/A;   TRANSURETHRAL RESECTION OF BLADDER TUMOR N/A 09/04/2016   Procedure: TRANSURETHRAL RESECTION OF BLADDER TUMOR (TURBT);  Surgeon: [name] MD;  [facility]  Service: Urology;  Laterality: N/A;    There were no vitals filed for this visit.   Subjective Assessment - 12/27/20 1537     Subjective  "I thought nothing of this stroke, but am realizing it's more far reaching than I anticipated"    Pertinent History Lt frontal centrum semiovale infarct 2/2 to small vessel disease (onset 11/23/20), DVT/PE, paroxysmal a-fib on Eliquis, aortic valve replacement (x2) w/  bioprosthetic cardiac valve, mitral ring annuloplasty, TAVR, CAD, Lt rotator cuff repair sx (May 2000), HLD    Currently in Pain? No/denies             Treatment/Exercises - 12/27/20    Catch and Name Catch and name activity w/ 8" ball used to facilitate bilateral integration, [place] alternating attention, processing speed, and eye-hand coordination. Tossing ball back and forth w/ OT targeting different positions outside pt's base of support with pt naming an object from selected category every catch; able to complete w/ no drops    Coin Activity Retrieving coins 5 at a time w/ R hand and translating each from palm to fingertips to stack in correct piles to facilitate [facility] precision grasp/release, and visual perception; able to complete w/ all coins and mod drops            OT Education - 12/27/20 1[place]    Education Details Continued condition-specific education  Person(s) Educated Patient    Methods Explanation    Comprehension Verbalized understanding             OT Short Term Goals - 12/27/20 1614       OT SHORT TERM GOAL #1   Title Pt will verbalize understanding of memory compensatory strategies to improve safety at home    Baseline Recent limitations w/ memory    Time 3    Period Weeks    Status Achieved   12/13/20   Target Date 12/23/20      OT SHORT TERM GOAL #2   Title Pt will identify at least 7/10 targets during environmental scanning activity w/out assist to improve safety w/ functional mobility    Time 3    Period Weeks    Status Achieved   12/20/20 - located 8/10 targets independently     OT SHORT TERM GOAL #3   Title Pt will improve coordination as evidenced by decreasing 9-HPT time by at least 5 sec w/ R, dominant hand    Baseline R hand 48 sec; L hand 36 sec    Time 3    Period Weeks    Status Achieved   12/27/20 - 41 sec; 12/20/20 - RUE 44 sec     OT SHORT TERM GOAL #4   Title Pt will demonstrate understanding of compensatory strategies, including  use of AE, to implement during BADLs/IADLs to increase safety and independence prn    Baseline Decreased knowledge of compensatory strategies    Time 3    Period Weeks    Status Achieved   12/20/20 - pt reports he is satsfied w/ participation in functional activities at this time            [place] - 12/07/20 1537       OT LONG TERM GOAL #1   Title Pt will be independent w/ HEP designed for [facility] and coordination by discharge    Baseline No HEP at this time    Time 6    Period Weeks    Status On-going      OT LONG TERM GOAL #2   Title Pt will improve coordination as evidenced by decreasing 9-HPT time by at least 9 sec w/ R, dominant hand    Baseline R hand 48 sec; L hand 36 sec    Time 6    Period Weeks    Status On-going      OT LONG TERM GOAL #3   Title Pt will write a sentence w/ at least 75% legibility and sizing in pt-determined acceptable level of time, using AE prn    Baseline Pt reports micrographia and increased time; pt keeps a written blog    Time 6    Period Weeks    Status On-going      OT LONG TERM GOAL #4   Title Pt will be able to sequence through an IADL task (e.g., med mgmt, housekeeping) w/ Mod I    Baseline Decreased organization of thought and sequencing    Time 6    Period Weeks    Status On-going             Plan - 12/27/20 1646     Clinical Impression Statement Pt continues to demonstrate improvements and has met 4/4 STGs at this time. OT facilitated activities to continue to address FM and GM control/coordination and higher level cognition/alternating attention w/ good results.    OT Occupational Profile and History  Detailed Assessment- Review of Records and additional review of physical, cognitive, psychosocial history related to current functional performance    Occupational performance deficits (Please refer to evaluation for details): ADL's;IADL's;Leisure;Social Participation    Body Structure / Function / Physical Skills  ADL;Decreased knowledge of use of DME;Gait;Strength;Balance;Dexterity;GMC;UE functional use;Body mechanics;Hearing;Cardiopulmonary status limiting activity;Endurance;IADL;ROM;Vision;Coordination;Mobility;Sensation;FMC;Decreased knowledge of precautions    Cognitive Skills Attention;Memory;Problem Solve;Safety Awareness;Sequencing    Psychosocial Skills Environmental  Adaptations    Rehab Potential Good    Clinical Decision Making Several treatment options, min-mod task modification necessary    Comorbidities Affecting Occupational Performance: Presence of comorbidities impacting occupational performance    Modification or Assistance to Complete Evaluation  Min-Moderate modification of tasks or assist with assess necessary to complete eval    OT Frequency 2x / week   May reduce frequency to 1x/week depending on progress   OT Duration 6 weeks    OT Treatment/Interventions Aquatic Therapy;Self-care/ADL training;DME and/or AE instruction;Balance training;Therapeutic activities;Cognitive remediation/compensation;Therapeutic exercise;Neuromuscular education;Functional Mobility Training;Visual/perceptual remediation/compensation;Patient/family education;Manual Therapy;Energy conservation;Electrical Stimulation    Plan Address sequencing (LTG4); continue to facilitate higher level GMC, dexterity, and executive functioning (catch and name, scavenger hunt)    Consulted and Agree with Plan of Care Patient;Family member/caregiver    Family Member Consulted Lelon Frohlich (wife)             Patient will benefit from skilled therapeutic intervention in order to improve the following deficits and impairments:   Body Structure / Function / Physical Skills: ADL, Decreased knowledge of use of DME, Gait, Strength, Balance, Dexterity, GMC, UE functional use, Body mechanics, Hearing, Cardiopulmonary status limiting activity, Endurance, IADL, ROM, Vision, Coordination, Mobility, Sensation, FMC, Decreased knowledge of  precautions Cognitive Skills: Attention, Memory, Problem Solve, Safety Awareness, Sequencing Psychosocial Skills: Environmental  Adaptations   Visit Diagnosis: Other lack of coordination  Unspecified symptoms and signs involving cognitive functions following cerebral infarction  Visuospatial deficit  Muscle weakness (generalized)    Problem List Patient Active Problem List   Diagnosis Date Noted   Cerebral thrombosis with cerebral infarction 11/25/2020   Acute right-sided weakness 11/24/2020   History of mitral valve repair 11/26/2018   Prosthetic valve dysfunction 11/25/2018   Ectatic abdominal aorta ([place]) 11/25/2018   S/P valve-in-valve TAVR 11/25/2018   History of bladder cancer    History of pulmonary embolus (PE)    Acute on chronic diastolic heart failure ([place]) 04/23/2018   History of aortic valve replacement 04/23/2018   Atrial fibrillation ([place]) 04/23/2018   Coronary atherosclerosis of native coronary artery 02/25/2014   Hyperlipidemia 02/25/2014   Embolism and thrombosis ([place]) 03/11/2013     Kathrine Cords, OTR/L, MSOT  12/28/2020, 4:55 PM  [address] [place], [name], [address] Phone: [phone]   Fax:  [phone]  Name: PAYMON ROSENSTEEL MRN: [id number] Date of Birth: Jun 08, 1929

## 2020-12-29 ENCOUNTER — Encounter: Payer: Self-pay | Admitting: Physical Therapy

## 2020-12-29 ENCOUNTER — Ambulatory Visit: Payer: Medicare Other | Admitting: Physical Therapy

## 2020-12-29 ENCOUNTER — Other Ambulatory Visit: Payer: Self-pay

## 2020-12-29 DIAGNOSIS — R262 Difficulty in walking, not elsewhere classified: Secondary | ICD-10-CM

## 2020-12-29 DIAGNOSIS — R278 Other lack of coordination: Secondary | ICD-10-CM

## 2020-12-29 DIAGNOSIS — M6281 Muscle weakness (generalized): Secondary | ICD-10-CM | POA: Diagnosis not present

## 2020-12-29 NOTE — Therapy (Signed)
[address] [place], [name], [address] Phone: [phone]   Fax:  [phone]  Physical Therapy Treatment  Patient Details  Name: Eric [name] MRN: [id number] Date of Birth: 02/17/1930 Referring Provider (PT): Debbe Odea   Encounter Date: 12/29/2020   PT End of Session - 12/29/20 1157     Visit Number 6    Date for PT Re-Evaluation 03/09/21    Authorization Type [employer]    PT Start Time 0930    PT Stop Time 1010    PT Time Calculation (min) 40 min    Activity Tolerance Patient tolerated treatment well    Behavior During Therapy [facility] for tasks assessed/performed             Past Medical History:  Diagnosis Date   Atrial flutter, paroxysmal ([place])    a. dx 04-02-2014, s/p  successful cardioversion 04-06-2014.   Chronic anticoagulation    on Eliquis--  due to recurrent dvt's and pe's   Dyslipidemia    History of bladder cancer urologist-  dr [name]   02-13-2016  s/p TURBT per path high grade papillary urothelial carcinoma   History of DVT (deep vein thrombosis)    2000-- RLE   History of melanoma excision    left flank;  07/ 2014 left lower leg and right upper chest   History of prostate cancer    Gleason 6--  s/p  radical prostatectomy 06/ 2000 in [place]---  no recurrence   History of pulmonary embolus (PE)    2000 & 2005  bilateral   Hx of valvuloplasty 06/19/2007   a. 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 05/2007 CABG x 1: s/p SVG-OM;  b. 10/2010 Ex MV: EF 72%, inf attenuation w/o ischemia, brief run of PAT with exercise. To   Thrombocytopenia ([place])    Thyroid goiter 2013   nodular   Wears hearing aid    BILATERAL   Wears partial  dentures    UPPER    Past Surgical History:  Procedure Laterality Date   AORTIC VALVE REPLACEMENT (AVR)/CORONARY ARTERY BYPASS GRAFTING (CABG)  06-19-2007  dr [name]   SVG to OM1 ;   AVR w/ #23 Mitralflow pericardial and ligation left atrial appendage;  MV repair w/ #28 Sorin 3-D memo Ring Annuloplasty with repair ruptured chordar of P-2 flail segments   CARDIAC CATHETERIZATION  05-12-2007  dr Leonia Reeves   single vessel 30% ostial LAD,  80% LCx;  severe to critial AS,  moderate MR,  normal LVF, ef 70%,  normal right heart pressures and cardiac outputs,  mild elevated LV end-diastolic pressure     CARDIOVASCULAR STRESS TEST  11-02-2010  dr Irish Lack   normal nuclear study w/ no ischemia or infarct/scar/  normal LV function and wall motion , ef 72%   CARDIOVERSION N/A 04/06/2014   Procedure: CARDIOVERSION;  Surgeon: [name], MD;  Location: [place];  Service: Cardiovascular;  Laterality: N/A;   successful   CATARACT EXTRACTION W/ INTRAOCULAR LENS  IMPLANT, BILATERAL  2012  approx   CORONARY ARTERY BYPASS GRAFT     RETROPUBIC RADICAL PROSTATECTOMY  06/ 2000  in [place]   RIGHT/LEFT HEART CATH AND CORONARY/GRAFT  ANGIOGRAPHY N/A 10/29/2018   Procedure: RIGHT/LEFT HEART CATH AND CORONARY/GRAFT ANGIOGRAPHY;  Surgeon: [name], MD;  Location: [place] CV LAB;  Service: Cardiovascular;  Laterality: N/A;   ROTATOR CUFF REPAIR Left 09/1998   TEE WITHOUT CARDIOVERSION N/A 04/06/2014   Procedure: TRANSESOPHAGEAL ECHOCARDIOGRAM (TEE);  Surgeon: [name], MD;  Location: [facility] ENDOSCOPY;  Service: Cardiovascular;  Laterality: N/A;  mild focal basa LVH of the septum, ef 50-55%, s/p AV annuloplasty ring, elongated chordae, thickened leaflets, mild to mod. MR, multiple small jets/arotic bioprosthetic valve sits well in position, mild AI/ thickened TV w/ mild reurg./mild LA   TEE WITHOUT CARDIOVERSION N/A 10/22/2018   Procedure: TRANSESOPHAGEAL ECHOCARDIOGRAM (TEE);  Surgeon: [name], MD;  Location: [facility] ENDOSCOPY;  Service: Cardiovascular;  Laterality: N/A;   TEE WITHOUT CARDIOVERSION N/A 11/25/2018   Procedure: TRANSESOPHAGEAL ECHOCARDIOGRAM (TEE);  Surgeon: [name], MD;  Location: [place];  Service: Open Heart Surgery;  Laterality: N/A;   TRANSCATHETER AORTIC VALVE REPLACEMENT, TRANSFEMORAL N/A 11/25/2018   Procedure: TRANSCATHETER AORTIC VALVE REPLACEMENT, TRANSFEMORAL;  Surgeon: [name], MD;  Location: [place];  Service: Open Heart Surgery;  Laterality: N/A;   TRANSTHORACIC ECHOCARDIOGRAM  03-23-2016   dr Irish Lack   EF 55-60%, reduced contribution atrial contraction to ventricular filling, due to increased ventricular diastolic pressure or atrial contratile dysfunction/  bioprosthetic AV w/ mod. regurg. (valve area 1.97cm^2)/ mild dilated ascending aorta/ mild thicken MV normal function annular ring prosthesis/severe LAE & mod. to sev.RAE/ PASP 64mHg/ mild TR/ ventricle septal motion show paradox   TRANSURETHRAL RESECTION OF BLADDER TUMOR N/A 02/13/2016   Procedure: TRANSURETHRAL RESECTION OF BLADDER TUMOR (TURBT);  Surgeon: [name] MD;  [facility]  Service: Urology;  Laterality: N/A;   TRANSURETHRAL RESECTION OF BLADDER TUMOR N/A 09/04/2016   Procedure: TRANSURETHRAL RESECTION OF BLADDER TUMOR (TURBT);  Surgeon: [name] MD;  [facility]  Service: Urology;  Laterality: N/A;    There were no vitals filed for this visit.   Subjective Assessment - 12/29/20 0933     Subjective no falls, reports feeling good today    Currently in Pain? No/denies                               [facility] - 12/29/20 0001       Ambulation/Gait   Gait Comments oustide no device in grass uneven surfaces and curbs      High Level Balance   High Level Balance Comments minitramp march and bounce, both hands, one hand and no hands, with marching needed CGA for  loss of balance posterior, obstacle course picking up cones, stairs up and down without hands, the step up and onto airex an doff, in Pbars airex balance beam tandem and side stepping, rockerboard in the [place], bosu balance in [place]      Knee/Hip Exercises: Aerobic   Recumbent Bike level 3.5 x 5 minutes                    PT Education - 12/29/20 1201     Education Details gave HEP for standing kicks 4 ways and side stepping at the kitchen counter    Person(s) Educated Patient    Methods Explanation;Demonstration;Handout    Comprehension Verbalized understanding              PT Short Term Goals - 12/07/20 1701  PT SHORT TERM GOAL #1   Title Pt will be I and compliant with initial HEP.    Time 2    Period Weeks    Status New               PT Long Term Goals - 12/27/20 1716       PT LONG TERM GOAL #1   Title Pt will be I and compliant with HEP.    Status On-going      PT LONG TERM GOAL #3   Title decrease TUG time to 12 seconds    Status On-going                   Plan - 12/29/20 1157     Clinical Impression Statement Patinet still really struggles with the dynamic surfaces.  He is back on his heels most of the time.  He really struggles to correct this.I added a lot more balance today with a lot of challenges.    PT Next Visit Plan be careful with the balance has difficulty on the dynamic surfaces    Consulted and Agree with Plan of Care Patient             Patient will benefit from skilled therapeutic intervention in order to improve the following deficits and impairments:  Abnormal gait, Decreased coordination, Difficulty walking, Cardiopulmonary status limiting activity, Decreased endurance, Decreased activity tolerance, Decreased balance, Postural dysfunction, Decreased strength, Decreased mobility  Visit Diagnosis: Muscle weakness (generalized)  Difficulty in walking, not elsewhere classified  Other lack of  coordination     Problem List Patient Active Problem List   Diagnosis Date Noted   Cerebral thrombosis with cerebral infarction 11/25/2020   Acute right-sided weakness 11/24/2020   History of mitral valve repair 11/26/2018   Prosthetic valve dysfunction 11/25/2018   Ectatic abdominal aorta ([place]) 11/25/2018   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 [place], [name], [address] Phone: [phone]   Fax:  [phone]  Name: Eric [name] MRN: [id number] Date of Birth: 07-19-1929

## 2021-01-03 ENCOUNTER — Ambulatory Visit: Payer: Medicare Other | Admitting: Adult Health

## 2021-01-03 ENCOUNTER — Encounter: Payer: Self-pay | Admitting: Adult Health

## 2021-01-03 VITALS — BP 122/78 | HR 76 | Ht 72.0 in | Wt 189.0 lb

## 2021-01-03 DIAGNOSIS — I4811 Longstanding persistent atrial fibrillation: Secondary | ICD-10-CM

## 2021-01-03 DIAGNOSIS — I10 Essential (primary) hypertension: Secondary | ICD-10-CM | POA: Diagnosis not present

## 2021-01-03 DIAGNOSIS — I639 Cerebral infarction, unspecified: Secondary | ICD-10-CM

## 2021-01-03 DIAGNOSIS — E785 Hyperlipidemia, unspecified: Secondary | ICD-10-CM | POA: Diagnosis not present

## 2021-01-03 NOTE — Patient Instructions (Addendum)
Continue working with therapies for likely ongoing recovery  Continue aspirin 81 mg daily and Eliquis (apixaban) daily  and atorvastatin '40mg'$  daily  for secondary stroke prevention  Continue to follow up with PCP regarding cholesterol and blood pressure management  Maintain strict control of hypertension with blood pressure goal below 130/90 and cholesterol with LDL cholesterol (bad cholesterol) goal below 70 mg/dL.       Followup in the future with me in 4 months or call earlier if needed       Thank you for coming to see Eric [name] at [facility]. I hope we have been able to provide you high quality care today.  You may receive a patient satisfaction survey over the next few weeks. We would appreciate your feedback and comments so that we may continue to improve ourselves and the health of our patients.    Stroke Prevention Some medical conditions and lifestyle choices can lead to a higher risk for a stroke. You can help to prevent a stroke by eating healthy foods and exercising. It also helps to not smoke and to manage any health problems youmay have. How can this condition affect me? A stroke is an emergency. It should be treated right away. A stroke can lead to brain damage or threaten your life. There is a better chance of surviving andgetting better after a stroke if you get medical help right away. What can increase my risk? The following medical conditions may increase your risk of a stroke: Diseases of the heart and blood vessels (cardiovascular disease). High blood pressure (hypertension). Diabetes. High cholesterol. Sickle cell disease. Problems with blood clotting. Being very overweight. Sleeping problems (obstructivesleep apnea). Other risk factors include: Being older than age 11. A history of blood clots, stroke, or mini-stroke (TIA). Race, ethnic background, or a family history of stroke. Smoking or using tobacco products. Taking birth control  pills, especially if you smoke. Heavy alcohol and drug use. Not being active. What actions can I take to prevent this? Manage your health conditions High cholesterol. Eat a healthy diet. If this is not enough to manage your cholesterol, you may need to take medicines. Take medicines as told by your doctor. High blood pressure. Try to keep your blood pressure below 130/80. If your blood pressure cannot be managed through a healthy diet and regular exercise, you may need to take medicines. Take medicines as told by your doctor. Ask your doctor if you should check your blood pressure at home. Have your blood pressure checked every year. Diabetes. Eat a healthy diet and get regular exercise. If your blood sugar (glucose) cannot be managed through diet and exercise, you may need to take medicines. Take medicines as told by your doctor. Talk to your doctor about getting checked for sleeping problems. Signs of a problem can include: Snoring a lot. Feeling very tired. Make sure that you manage any other conditions you have. Nutrition  Follow instructions from your doctor about what to eat or drink. You may be told to: Eat and drink fewer calories each day. Limit how much salt (sodium) you use to 1,500 milligrams (mg) each day. Use only healthy fats for cooking, such as olive oil, canola oil, and sunflower oil. Eat healthy foods. To do this: Choose foods that are high in fiber. These include whole grains, and fresh fruits and vegetables. Eat at least 5 servings of fruits and vegetables a day. Try to fill one-half of your plate with fruits and vegetables at each meal.  Choose low-fat (lean) proteins. These include low-fat cuts of meat, chicken without skin, fish, tofu, beans, and nuts. Eat low-fat dairy products. Avoid foods that: Are high in salt. Have saturated fat. Have trans fat. Have cholesterol. Are processed or pre-made. Count how many carbohydrates you eat and drink each  day.  Lifestyle If you drink alcohol: Limit how much you have to: 0-1 drink a day for women who are not pregnant. 0-2 drinks a day for men. Know how much alcohol is in your drink. In the [place] one drink equals one 12 oz bottle of beer (316m), one 5 oz glass of wine (1455m, or one 1 oz glass of hard liquor (4472m Do not smoke or use any products that have nicotine or tobacco. If you need help quitting, ask your doctor. Avoid secondhand smoke. Do not use drugs. Activity  Try to stay at a healthy weight. Get at least 30 minutes of exercise on most days, such as: Fast walking. Biking. Swimming.  Medicines Take over-the-counter and prescription medicines only as told by your doctor. Avoid taking birth control pills. Talk to your doctor about the risks of taking birth control pills if: You are over 35 39ars old. You smoke. You get very bad headaches. You have had a blood clot. Where to find more information American Stroke Association: www.strokeassociation.org Get help right away if: You or a loved one has any signs of a stroke. "BE FAST" is an easy way to remember the warning signs: B - Balance. Dizziness, sudden trouble walking, or loss of balance. E - Eyes. Trouble seeing or a change in how you see. F - Face. Sudden weakness or loss of feeling of the face. The face or eyelid may droop on one side. A - Arms. Weakness or loss of feeling in an arm. This happens all of a sudden and most often on one side of the body. S - Speech. Sudden trouble speaking, slurred speech, or trouble understanding what people say. T - Time. Time to call emergency services. Write down what time symptoms started. You or a loved one has other signs of a stroke, such as: A sudden, very bad headache with no known cause. Feeling like you may vomit (nausea). Vomiting. A seizure. These symptoms may be an emergency. Get help right away. Call your local emergency services (911 in the [place] Do not wait to  see if the symptoms will go away. Do not drive yourself to the hospital. Summary You can help to prevent a stroke by eating healthy, exercising, and not smoking. It also helps to manage any health problems you have. Do not smoke or use any products that contain nicotine or tobacco. Get help right away if you or a loved one has any signs of a stroke. This information is not intended to replace advice given to you by your health care provider. Make sure you discuss any questions you have with your healthcare provider. Document Revised: 12/07/2019 Document Reviewed: 12/07/2019 Elsevier Patient Education  [id number]

## 2021-01-03 NOTE — Progress Notes (Signed)
[facility] [address] [place] [address] [phone]       HOSPITAL FOLLOW UP NOTE  Mr. [name] Date of Birth:  July 25, 1929 Medical Record Number:  [id number]   Reason for Referral:  hospital stroke follow up    SUBJECTIVE:   CHIEF COMPLAINT:  Chief Complaint  Patient presents with   Stroke    Rm [place] wife- Eric [name]  "PT/OT through Sept, doing well"    HPI:   Eric [name] is a 87 year old male with history of A. fib on Eliquis, DVT/PE, AV repair x2, HTN and HLD admitted on 11/24/2020 for right hand weakness and right leg weakness since night prior.  Personally reviewed hospitalization pertinent progress notes, lab work and imaging. CT no acute abnormality.  MRI showed left semiovale small infarct.  Old lacunar infarct bilateral white matter, BG, right thalamic, right cerebellum.  MRA head and neck negative.  LDL 66, A1c 5.8, EF 55 to 60%.  Creatinine 0.85.  Etiology likely due to small vessel disease given location.  Recommended continued use of Eliquis for hx of A. Fib with additional aspirin 81 mg daily for further stroke prevention and continue atorvastatin 40 mg daily and Zetia.  PT/OT recommended outpatient PT/OT for residual imbalance.  Today, 01/03/2021, Mr. Beavin is being seen for hospital follow-up accompanied by his wife.  Overall doing well.  Currently working with PT/OT at [facility] for residual gait impairment and mild RUE coordination difficulties - reports continued progress.  Baseline gait impairment with imbalance worsened post stroke.  Able to ambulate without assistive device - denies any recent falls.  He has not yet returned back to driving.  Denies new stroke/TIA symptoms.  Remains on Eliquis and aspirin with easier bruising but no bleeding.  Remains on atorvastatin and Zetia without side effects.  Blood pressure 122/78.  Stable with home monitoring.  No further concerns at this time.     ROS:   14 system review of  systems performed and negative with exception of those listed in HPI  PMH:  Past Medical History:  Diagnosis Date   Atrial flutter, paroxysmal ([place])    a. dx 04-02-2014, s/p  successful cardioversion 04-06-2014.   Chronic anticoagulation    on Eliquis--  due to recurrent dvt's and pe's   Dyslipidemia    History of bladder cancer urologist-  dr [name]   02-13-2016  s/p TURBT per path high grade papillary urothelial carcinoma   History of DVT (deep vein thrombosis)    2000-- RLE   History of melanoma excision    left flank;  07/ 2014 left lower leg and right upper chest   History of prostate cancer    Gleason 6--  s/p  radical prostatectomy 06/ 2000 in [place]---  no recurrence   History of pulmonary embolus (PE)    2000 & 2005  bilateral   Hx of valvuloplasty 06/19/2007   a. 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 05/2007 CABG x 1: s/p SVG-OM;  b. 10/2010 Ex MV: EF 72%, inf attenuation w/o ischemia, brief run of PAT with exercise. To   Stroke [facility])    Thrombocytopenia ([place])    Thyroid goiter 2013   nodular  Wears hearing aid    BILATERAL   Wears partial dentures    UPPER    PSH:  Past Surgical History:  Procedure Laterality Date   AORTIC VALVE REPLACEMENT (AVR)/CORONARY ARTERY BYPASS GRAFTING (CABG)  06-19-2007  dr [name]   SVG to OM1 ;   AVR w/ #23 Mitralflow pericardial and ligation left atrial appendage;  MV repair w/ #28 Sorin 3-D memo Ring Annuloplasty with repair ruptured chordar of P-2 flail segments   CARDIAC CATHETERIZATION  05-12-2007  dr Leonia Reeves   single vessel 30% ostial LAD,  80% LCx;  severe to critial AS,  moderate MR,  normal LVF, ef 70%,  normal right heart pressures and cardiac outputs,  mild elevated LV  end-diastolic pressure     CARDIOVASCULAR STRESS TEST  11-02-2010  dr Irish Lack   normal nuclear study w/ no ischemia or infarct/scar/  normal LV function and wall motion , ef 72%   CARDIOVERSION N/A 04/06/2014   Procedure: CARDIOVERSION;  Surgeon: [name], MD;  Location: [place];  Service: Cardiovascular;  Laterality: N/A;   successful   CATARACT EXTRACTION W/ INTRAOCULAR LENS  IMPLANT, BILATERAL  2012  approx   CORONARY ARTERY BYPASS GRAFT     PROSTATECTOMY     RETROPUBIC RADICAL PROSTATECTOMY  06/ 2000  in [place], [place]   RIGHT/LEFT HEART CATH AND CORONARY/GRAFT ANGIOGRAPHY N/A 10/29/2018   Procedure: RIGHT/LEFT HEART CATH AND CORONARY/GRAFT ANGIOGRAPHY;  Surgeon: [name], MD;  Location: [place] CV [facility];  Service: Cardiovascular;  Laterality: N/A;   ROTATOR CUFF REPAIR Left 09/1998   TEE WITHOUT CARDIOVERSION N/A 04/06/2014   Procedure: TRANSESOPHAGEAL ECHOCARDIOGRAM (TEE);  Surgeon: [name], MD;  Location: [facility] ENDOSCOPY;  Service: Cardiovascular;  Laterality: N/A;  mild focal basa LVH of the septum, ef 50-55%, s/p AV annuloplasty ring, elongated chordae, thickened leaflets, mild to mod. MR, multiple small jets/arotic bioprosthetic valve sits well in position, mild AI/ thickened TV w/ mild reurg./mild LA   TEE WITHOUT CARDIOVERSION N/A 10/22/2018   Procedure: TRANSESOPHAGEAL ECHOCARDIOGRAM (TEE);  Surgeon: [name], MD;  Location: [facility] ENDOSCOPY;  Service: Cardiovascular;  Laterality: N/A;   TEE WITHOUT CARDIOVERSION N/A 11/25/2018   Procedure: TRANSESOPHAGEAL ECHOCARDIOGRAM (TEE);  Surgeon: [name], MD;  Location: [place];  Service: Open Heart Surgery;  Laterality: N/A;   TRANSCATHETER AORTIC VALVE REPLACEMENT, TRANSFEMORAL N/A 11/25/2018   Procedure: TRANSCATHETER AORTIC VALVE REPLACEMENT, TRANSFEMORAL;  Surgeon: [name], MD;  Location: [place];  Service: Open Heart Surgery;  Laterality: N/A;   TRANSTHORACIC ECHOCARDIOGRAM  03-23-2016   dr  Irish Lack   EF 55-60%, reduced contribution atrial contraction to ventricular filling, due to increased ventricular diastolic pressure or atrial contratile dysfunction/  bioprosthetic AV w/ mod. regurg. (valve area 1.97cm^2)/ mild dilated ascending aorta/ mild thicken MV normal function annular ring prosthesis/severe LAE & mod. to sev.RAE/ PASP 96mHg/ mild TR/ ventricle septal motion show paradox   TRANSURETHRAL RESECTION OF BLADDER TUMOR N/A 02/13/2016   Procedure: TRANSURETHRAL RESECTION OF BLADDER TUMOR (TURBT);  Surgeon: [name] MD;  [facility]  Service: Urology;  Laterality: N/A;   TRANSURETHRAL RESECTION OF BLADDER TUMOR N/A 09/04/2016   Procedure: TRANSURETHRAL RESECTION OF BLADDER TUMOR (TURBT);  Surgeon: [name] MD;  [facility]  Service: Urology;  Laterality: N/A;    Social History:  Social History   Socioeconomic History   Marital status: Married    Spouse name: ALelon [name]  Number of children: 2   Years  of education: Not on file   Highest education level: Some college, no degree  Occupational History   Occupation: Retired  Tobacco Use   Smoking status: Never   Smokeless tobacco: Never  Substance and Sexual Activity   Alcohol use: Yes    Alcohol/week: 1.0 - 2.0 standard drink    Types: 1 - 2 Standard drinks or equivalent per week    Comment: OCC   Drug use: No   Sexual activity: Not on file  Other Topics Concern   Not on file  Social History Narrative   01/03/21 Lives in [place] with wife.  Active.   Caffeine- 1-2 c coffee daily   Social Determinants of Health   Financial Resource Strain: Not on file  Food Insecurity: Not on file  Transportation Needs: Not on file  Physical Activity: Not on file  Stress: Not on file  Social Connections: Not on file  Intimate Partner Violence: Not on file    Family History:  Family History  Problem Relation Age of Onset   Leukemia Father    Supraventricular  tachycardia Sister    Heart attack Neg Hx    Stroke Neg Hx    Hypertension Neg Hx     Medications:   Current Outpatient Medications on File Prior to Visit  Medication Sig Dispense Refill   aspirin EC 81 MG tablet Take 81 mg by mouth daily. Swallow whole.     atorvastatin (LIPITOR) 40 MG tablet TAKE 1 TABLET BY MOUTH IN  THE EVENING 90 tablet 3   ELIQUIS 5 MG TABS tablet TAKE 1 TABLET BY MOUTH  TWICE DAILY 180 tablet 1   Eyelid Cleansers (OCUSOFT EYELID CLEANSING) PADS Place 1 application into the left eye in the morning.     ezetimibe (ZETIA) 10 MG tablet TAKE 1 TABLET BY MOUTH IN  THE EVENING 90 tablet 3   furosemide (LASIX) 20 MG tablet TAKE 1 TABLET BY MOUTH  EVERY OTHER DAY, MAY TAKE 1 ADDITIONAL DOSE IF NEEDED  FOR LEG SWELLING 90 tablet 3   losartan (COZAAR) 25 MG tablet Take 25 mg by mouth as needed.     amoxicillin (AMOXIL) 500 MG tablet Take 4 tablets (2,000 mg total) by mouth as directed. Take 1 hour prior to dental work, including cleanings. (Patient not taking: Reported on 01/03/2021) 4 tablet 6   COVID-19 mRNA Vac-TriS, Pfizer, (PFIZER-BIONT COVID-19 VAC-TRIS) SUSP injection Inject into the muscle. (Patient not taking: Reported on 01/03/2021) 0.3 mL 0   No current facility-administered medications on file prior to visit.    Allergies:  No Known Allergies    OBJECTIVE:  Physical Exam  Vitals:   01/03/21 1311  BP: 122/78  Pulse: 76  Weight: 189 lb (85.7 kg)  Height: 6' (1.829 m)   Body mass index is 25.63 kg/m. No results found.  Post stroke PHQ 2/9 Depression screen PHQ 2/9 01/03/2021  Decreased Interest 0  Down, Depressed, Hopeless 0  PHQ - 2 Score 0  Some recent data might be hidden     General: well developed, well nourished, very pleasant elderly Caucasian male, seated, in no evident distress Head: head normocephalic and atraumatic.   Neck: supple with no carotid or supraclavicular bruits Cardiovascular: irregular rate and rhythm, no  murmurs Musculoskeletal: no deformity Skin:  no rash/petichiae Vascular:  Normal pulses all extremities   Neurologic Exam Mental Status: Awake and fully alert.  Fluent speech and language.  Oriented to place and time. Recent and remote memory intact. Attention span,  concentration and fund of knowledge appropriate. Mood and affect appropriate.  Cranial Nerves: Fundoscopic exam reveals sharp disc margins. Pupils equal, briskly reactive to light. Extraocular movements full without nystagmus. Visual fields full to confrontation. Hearing intact. Facial sensation intact. Face, tongue, palate moves normally and symmetrically.  Motor: Normal bulk and tone. Normal strength in all tested extremity muscles except very slight decreased right hand dexterity Sensory.: intact to touch , pinprick , position and vibratory sensation.  Coordination: Rapid alternating movements normal in all extremities. Finger-to-nose and heel-to-shin performed accurately bilaterally. Mild L>R upper extremity action tremors (chronic) Gait and Station: Arises from chair without difficulty. Stance is normal. Gait demonstrates normal stride length with mild unsteadiness/imbalance especially with turns without assistive device.  Tandem walk and heel toe with moderate difficulty.  Reflexes: 1+ and symmetric. Toes downgoing.     NIHSS  0 Modified Rankin  2      ASSESSMENT: RONSON Eric [name] is a 87 y.o. year old male with recent left frontal centrum semiovale infarct on 11/24/2020 secondary to small vessel disease after presenting with right-sided weakness.  Vascular risk factors include prior strokes on imaging (b/l WM, BG, R thalamic and R cerebellum), HTN, HLD, bladder cancer dx'd s/p TURBT 2017, prostate cancer s/p prostatectomy 2000, A. fib on Eliquis, hx of DVT/PE, chronic diastolic heart failure, MV repair and single vessel bypass and AV replacement 2009.     PLAN:  Recent ischemic infarct:  Residual deficit: Gait  impairment with imbalance and mild right hand dexterity deficit.  Continue aspirin 81 mg daily and Eliquis (apixaban) daily  and atorvastatin 40 mg daily and Zetia 10 times daily for secondary stroke prevention.   Discussed secondary stroke prevention measures and importance of close PCP follow up for aggressive stroke risk factor management. I have gone over the pathophysiology of stroke, warning signs and symptoms, risk factors and their management in some detail with instructions to go to the closest emergency room for symptoms of concern. Atrial fibrillation: On Eliquis 5 mg twice daily managed by cardiology HTN: BP goal <130/90.  Stable on current regimen per PCP HLD: LDL goal <70. Recent LDL 66 -continue current statin regimen per PCP.     Follow up in 4 months or call earlier if needed   CC:  [place] provider: Dr. Leonie Man PCP: [name], MD    I spent 56 minutes of face-to-face and non-face-to-face time with patient and wife.  This included previsit chart review including review of recent hospitalization, lab review, study review, electronic health record documentation, patient education regarding recent stroke including etiology, residual deficits and typical recovery time, secondary stroke prevention measures and importance of managing stroke risk factors and answered all other questions to patient and wife's satisfaction  Frann Rider, AGNP-BC  [name] [address] [place], [place] [address]  Phone [phone] Fax [phone] Note: This document was prepared with digital dictation and possible smart phrase technology. Any transcriptional errors that result from this process are unintentional.

## 2021-01-04 ENCOUNTER — Encounter: Payer: Self-pay | Admitting: Occupational Therapy

## 2021-01-04 ENCOUNTER — Other Ambulatory Visit: Payer: Self-pay

## 2021-01-04 ENCOUNTER — Ambulatory Visit: Payer: Medicare Other | Admitting: Occupational Therapy

## 2021-01-04 ENCOUNTER — Encounter: Payer: Self-pay | Admitting: Physical Therapy

## 2021-01-04 ENCOUNTER — Ambulatory Visit: Payer: Medicare Other | Admitting: Physical Therapy

## 2021-01-04 DIAGNOSIS — M6281 Muscle weakness (generalized): Secondary | ICD-10-CM

## 2021-01-04 DIAGNOSIS — R262 Difficulty in walking, not elsewhere classified: Secondary | ICD-10-CM

## 2021-01-04 DIAGNOSIS — I69319 Unspecified symptoms and signs involving cognitive functions following cerebral infarction: Secondary | ICD-10-CM

## 2021-01-04 DIAGNOSIS — R278 Other lack of coordination: Secondary | ICD-10-CM

## 2021-01-04 DIAGNOSIS — R41842 Visuospatial deficit: Secondary | ICD-10-CM

## 2021-01-04 NOTE — Therapy (Signed)
[place]. [place], [name], [address] Phone: [phone]   Fax:  [phone]  Occupational Therapy Treatment  Patient Details  Name: Eric [name] MRN: [id number] Date of Birth: 1929-09-01 Referring Provider (OT): Debbe Odea, MD   Encounter Date: 01/04/2021   OT End of Session - 01/04/21 0906     Visit Number 6    Number of Visits 13    Date for OT Re-Evaluation 02/27/21    Authorization Type [employer] ($10 copay/day)    Authorization Time Period VL: [place]    Progress Note Due on Visit 10    OT Start Time [phone]   pt arrival time   OT Stop Time 0928    OT Time Calculation (min) 39 min    Activity Tolerance Patient tolerated treatment well    Behavior During Therapy [facility] for tasks assessed/performed             Past Medical History:  Diagnosis Date   Atrial flutter, paroxysmal ([place])    a. dx 04-02-2014, s/p  successful cardioversion 04-06-2014.   Chronic anticoagulation    on Eliquis--  due to recurrent dvt's and pe's   Dyslipidemia    History of bladder cancer urologist-  dr [name]   02-13-2016  s/p TURBT per path high grade papillary urothelial carcinoma   History of DVT (deep vein thrombosis)    2000-- RLE   History of melanoma excision    left flank;  07/ 2014 left lower leg and right upper chest   History of prostate cancer    Gleason 6--  s/p  radical prostatectomy 06/ 2000 in [place]---  no recurrence   History of pulmonary embolus (PE)    2000 & 2005  bilateral   Hx of valvuloplasty 06/19/2007   a. 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 05/2007 CABG x 1: s/p SVG-OM;  b. 10/2010 Ex MV: EF 72%, inf attenuation w/o ischemia,  brief run of PAT with exercise. To   Stroke ([place])    Thrombocytopenia ([place])    Thyroid goiter 2013   nodular   Wears hearing aid    BILATERAL   Wears partial dentures    UPPER    Past Surgical History:  Procedure Laterality Date   AORTIC VALVE REPLACEMENT (AVR)/CORONARY ARTERY BYPASS GRAFTING (CABG)  06-19-2007  dr [name]   SVG to OM1 ;   AVR w/ #23 Mitralflow pericardial and ligation left atrial appendage;  MV repair w/ #28 Sorin 3-D memo Ring Annuloplasty with repair ruptured chordar of P-2 flail segments   CARDIAC CATHETERIZATION  05-12-2007  dr Leonia Reeves   single vessel 30% ostial LAD,  80% LCx;  severe to critial AS,  moderate MR,  normal LVF, ef 70%,  normal right heart pressures and cardiac outputs,  mild elevated LV end-diastolic pressure     CARDIOVASCULAR STRESS TEST  11-02-2010  dr Irish Lack   normal nuclear study w/ no ischemia or infarct/scar/  normal LV function and wall motion , ef 72%   CARDIOVERSION N/A 04/06/2014   Procedure: CARDIOVERSION;  Surgeon: [name], MD;  Location: [place];  Service: Cardiovascular;  Laterality: N/A;   successful   CATARACT EXTRACTION  W/ INTRAOCULAR LENS  IMPLANT, BILATERAL  2012  approx   CORONARY ARTERY BYPASS GRAFT     PROSTATECTOMY     RETROPUBIC RADICAL PROSTATECTOMY  06/ 2000  in [place]   RIGHT/LEFT HEART CATH AND CORONARY/GRAFT ANGIOGRAPHY N/A 10/29/2018   Procedure: RIGHT/LEFT HEART CATH AND CORONARY/GRAFT ANGIOGRAPHY;  Surgeon: [name], MD;  Location: [place] CV LAB;  Service: Cardiovascular;  Laterality: N/A;   ROTATOR CUFF REPAIR Left 09/1998   TEE WITHOUT CARDIOVERSION N/A 04/06/2014   Procedure: TRANSESOPHAGEAL ECHOCARDIOGRAM (TEE);  Surgeon: [name], MD;  Location: [facility] ENDOSCOPY;  Service: Cardiovascular;  Laterality: N/A;  mild focal basa LVH of the septum, ef 50-55%, s/p AV annuloplasty ring, elongated chordae, thickened leaflets, mild to mod. MR, multiple small jets/arotic bioprosthetic  valve sits well in position, mild AI/ thickened TV w/ mild reurg./mild LA   TEE WITHOUT CARDIOVERSION N/A 10/22/2018   Procedure: TRANSESOPHAGEAL ECHOCARDIOGRAM (TEE);  Surgeon: [name], MD;  Location: [facility] ENDOSCOPY;  Service: Cardiovascular;  Laterality: N/A;   TEE WITHOUT CARDIOVERSION N/A 11/25/2018   Procedure: TRANSESOPHAGEAL ECHOCARDIOGRAM (TEE);  Surgeon: [name], MD;  Location: [place];  Service: Open Heart Surgery;  Laterality: N/A;   TRANSCATHETER AORTIC VALVE REPLACEMENT, TRANSFEMORAL N/A 11/25/2018   Procedure: TRANSCATHETER AORTIC VALVE REPLACEMENT, TRANSFEMORAL;  Surgeon: [name], MD;  Location: [place];  Service: Open Heart Surgery;  Laterality: N/A;   TRANSTHORACIC ECHOCARDIOGRAM  03-23-2016   dr Irish Lack   EF 55-60%, reduced contribution atrial contraction to ventricular filling, due to increased ventricular diastolic pressure or atrial contratile dysfunction/  bioprosthetic AV w/ mod. regurg. (valve area 1.97cm^2)/ mild dilated ascending aorta/ mild thicken MV normal function annular ring prosthesis/severe LAE & mod. to sev.RAE/ PASP 59mHg/ mild TR/ ventricle septal motion show paradox   TRANSURETHRAL RESECTION OF BLADDER TUMOR N/A 02/13/2016   Procedure: TRANSURETHRAL RESECTION OF BLADDER TUMOR (TURBT);  Surgeon: [name] MD;  [facility]  Service: Urology;  Laterality: N/A;   TRANSURETHRAL RESECTION OF BLADDER TUMOR N/A 09/04/2016   Procedure: TRANSURETHRAL RESECTION OF BLADDER TUMOR (TURBT);  Surgeon: [name] MD;  [facility]  Service: Urology;  Laterality: N/A;    There were no vitals filed for this visit.   Subjective Assessment - 01/04/21 0858     Subjective  Pt reports he had a follow-up w/ Neurology yesterday and that it went well; "no further concerns at this time"    Pertinent History Lt frontal centrum semiovale infarct 2/2 to small vessel disease (onset 11/23/20), DVT/PE,  paroxysmal a-fib on Eliquis, aortic valve replacement (x2) w/ bioprosthetic cardiac valve, mitral ring annuloplasty, TAVR, CAD, Lt rotator cuff repair sx (May 2000), HLD    Currently in Pain? No/denies             Treatment/Exercises - 01/04/21    IADL: Meal Prep Simple meal prep activity completed w/ 3-step recipe involving 6 ingredients to address coordination, bilateral integration, functional use of RUE, cognitive/executive functioning, and functional mobility. Pt able to complete activity w/ SPV, requiring extended time for initiation, processing, and problem-solving, as well as min verbal cues to facilitate success            OT Education - 01/04/21 0915     Education Details Education provided regarding potential return to driving in the future; handout provided as a resource for local driving evaluation programs prn    Person(s) Educated Patient    Methods Explanation;Handout  Comprehension Verbalized understanding             OT Short Term Goals - 12/27/20 1614       OT SHORT TERM GOAL #1   Title Pt will verbalize understanding of memory compensatory strategies to improve safety at home    Baseline Recent limitations w/ memory    Time 3    Period Weeks    Status Achieved   12/13/20   Target Date 12/23/20      OT SHORT TERM GOAL #2   Title Pt will identify at least 7/10 targets during environmental scanning activity w/out assist to improve safety w/ functional mobility    Time 3    Period Weeks    Status Achieved   12/20/20 - located 8/10 targets independently     OT SHORT TERM GOAL #3   Title Pt will improve coordination as evidenced by decreasing 9-HPT time by at least 5 sec w/ R, dominant hand    Baseline R hand 48 sec; L hand 36 sec    Time 3    Period Weeks    Status Achieved   12/27/20 - 41 sec; 12/20/20 - RUE 44 sec     OT SHORT TERM GOAL #4   Title Pt will demonstrate understanding of compensatory strategies, including use of AE, to implement during  BADLs/IADLs to increase safety and independence prn    Baseline Decreased knowledge of compensatory strategies    Time 3    Period Weeks    Status Achieved   12/20/20 - pt reports he is satsfied w/ participation in functional activities at this time            [place] - 12/07/20 1537       OT LONG TERM GOAL #1   Title Pt will be independent w/ HEP designed for [facility] and coordination by discharge    Baseline No HEP at this time    Time 6    Period Weeks    Status On-going      OT LONG TERM GOAL #2   Title Pt will improve coordination as evidenced by decreasing 9-HPT time by at least 9 sec w/ R, dominant hand    Baseline R hand 48 sec; L hand 36 sec    Time 6    Period Weeks    Status On-going      OT LONG TERM GOAL #3   Title Pt will write a sentence w/ at least 75% legibility and sizing in pt-determined acceptable level of time, using AE prn    Baseline Pt reports micrographia and increased time; pt keeps a written blog    Time 6    Period Weeks    Status On-going      OT LONG TERM GOAL #4   Title Pt will be able to sequence through an IADL task (e.g., med mgmt, housekeeping) w/ Mod I    Baseline Decreased organization of thought and sequencing    Time 6    Period Weeks    Status On-going             Plan - 01/04/21 1507     Clinical Impression Statement Pt continues to make progress toward goals and reports notable functional improvements at home, including improved utensil use during meals. OT facilitated meal prep activity w/ pt completing activity w/ SPV, demo'ing decreased initiation and requiring extended time for processing for success. OT also had detailed discussion regarding potential return to  driving; reviewed potential benefit of a formal driving evaluation when/if pt begins to feel comfortable considering return to driving (pt reports his PCP mentioned this as well), but recommended pt ultimately defer to his physician.    OT Occupational Profile  and History Detailed Assessment- Review of Records and additional review of physical, cognitive, psychosocial history related to current functional performance    Occupational performance deficits (Please refer to evaluation for details): ADL's;IADL's;Leisure;Social Participation    Body Structure / Function / Physical Skills ADL;Decreased knowledge of use of DME;Gait;Strength;Balance;Dexterity;GMC;UE functional use;Body mechanics;Hearing;Cardiopulmonary status limiting activity;Endurance;IADL;ROM;Vision;Coordination;Mobility;Sensation;FMC;Decreased knowledge of precautions    Cognitive Skills Attention;Memory;Problem Solve;Safety Awareness;Sequencing    Psychosocial Skills Environmental  Adaptations    Rehab Potential Good    Clinical Decision Making Several treatment options, min-mod task modification necessary    Comorbidities Affecting Occupational Performance: Presence of comorbidities impacting occupational performance    Modification or Assistance to Complete Evaluation  Min-Moderate modification of tasks or assist with assess necessary to complete eval    OT Frequency 2x / week   May reduce frequency to 1x/week depending on progress   OT Duration 6 weeks    OT Treatment/Interventions Aquatic Therapy;Self-care/ADL training;DME and/or AE instruction;Balance training;Therapeutic activities;Cognitive remediation/compensation;Therapeutic exercise;Neuromuscular education;Functional Mobility Training;Visual/perceptual remediation/compensation;Patient/family education;Manual Therapy;Energy conservation;Electrical Stimulation    Plan Med mgmt activity; continue to facilitate higher level GMC, dexterity, and executive functioning (catch and name, scavenger hunt)    Consulted and Agree with Plan of Care Patient;Family member/caregiver    Family Member Consulted Lelon Frohlich (wife)             Patient will benefit from skilled therapeutic intervention in order to improve the following deficits and  impairments:   Body Structure / Function / Physical Skills: ADL, Decreased knowledge of use of DME, Gait, Strength, Balance, Dexterity, GMC, UE functional use, Body mechanics, Hearing, Cardiopulmonary status limiting activity, Endurance, IADL, ROM, Vision, Coordination, Mobility, Sensation, FMC, Decreased knowledge of precautions Cognitive Skills: Attention, Memory, Problem Solve, Safety Awareness, Sequencing Psychosocial Skills: Environmental  Adaptations   Visit Diagnosis: Unspecified symptoms and signs involving cognitive functions following cerebral infarction  Visuospatial deficit  Other lack of coordination  Muscle weakness (generalized)    Problem List Patient Active Problem List   Diagnosis Date Noted   Cerebral thrombosis with cerebral infarction 11/25/2020   Acute right-sided weakness 11/24/2020   History of mitral valve repair 11/26/2018   Prosthetic valve dysfunction 11/25/2018   Ectatic abdominal aorta ([place]) 11/25/2018   S/P valve-in-valve TAVR 11/25/2018   History of bladder cancer    History of pulmonary embolus (PE)    Acute on chronic diastolic heart failure ([place]) 04/23/2018   History of aortic valve replacement 04/23/2018   Atrial fibrillation ([place]) 04/23/2018   Coronary atherosclerosis of native coronary artery 02/25/2014   Hyperlipidemia 02/25/2014   Embolism and thrombosis ([place]) 03/11/2013     Kathrine Cords, OTR/L, MSOT  01/04/2021, 3:22 PM  [place] [place], [name], [address] Phone: [phone]   Fax:  [phone]  Name: Eric [name] MRN: [id number] Date of Birth: Apr 08, 1930

## 2021-01-04 NOTE — Therapy (Signed)
[place]. [place], [name], [address] Phone: [phone]   Fax:  [phone]  Physical Therapy Treatment  Patient Details  Name: Eric [name] MRN: [id number] Date of Birth: [birth year] Referring Provider (PT): Debbe Odea   Encounter Date: 01/04/2021   PT End of Session - 01/04/21 1021     Visit Number 7    Date for PT Re-Evaluation 03/09/21    Authorization Type [employer]    PT Start Time 0930    PT Stop Time 1015    PT Time Calculation (min) 45 min    Activity Tolerance Patient tolerated treatment well    Behavior During Therapy [facility] for tasks assessed/performed             Past Medical History:  Diagnosis Date   Atrial flutter, paroxysmal ([place])    a. dx 04-02-2014, s/p  successful cardioversion 04-06-2014.   Chronic anticoagulation    on Eliquis--  due to recurrent dvt's and pe's   Dyslipidemia    History of bladder cancer urologist-  dr [name]   02-13-2016  s/p TURBT per path high grade papillary urothelial carcinoma   History of DVT (deep vein thrombosis)    2000-- RLE   History of melanoma excision    left flank;  07/ 2014 left lower leg and right upper chest   History of prostate cancer    Gleason 6--  s/p  radical prostatectomy 06/ 2000 in [place]---  no recurrence   History of pulmonary embolus (PE)    2000 & 2005  bilateral   Hx of valvuloplasty 06/19/2007   a. 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 05/2007 CABG x 1: s/p SVG-OM;  b. 10/2010 Ex MV: EF 72%, inf attenuation w/o ischemia, brief run of PAT with exercise. To   Stroke ([place])    Thrombocytopenia ([place])    Thyroid goiter 2013   nodular   Wears hearing aid    BILATERAL    Wears partial dentures    UPPER    Past Surgical History:  Procedure Laterality Date   AORTIC VALVE REPLACEMENT (AVR)/CORONARY ARTERY BYPASS GRAFTING (CABG)  06-19-2007  dr [name]   SVG to OM1 ;   AVR w/ #23 Mitralflow pericardial and ligation left atrial appendage;  MV repair w/ #28 Sorin 3-D memo Ring Annuloplasty with repair ruptured chordar of P-2 flail segments   CARDIAC CATHETERIZATION  05-12-2007  dr Leonia Reeves   single vessel 30% ostial LAD,  80% LCx;  severe to critial AS,  moderate MR,  normal LVF, ef 70%,  normal right heart pressures and cardiac outputs,  mild elevated LV end-diastolic pressure     CARDIOVASCULAR STRESS TEST  11-02-2010  dr Irish Lack   normal nuclear study w/ no ischemia or infarct/scar/  normal LV function and wall motion , ef 72%   CARDIOVERSION N/A 04/06/2014   Procedure: CARDIOVERSION;  Surgeon: [name], MD;  Location: [place];  Service: Cardiovascular;  Laterality: N/A;   successful   CATARACT EXTRACTION W/ INTRAOCULAR LENS  IMPLANT, BILATERAL  2012  approx   CORONARY ARTERY BYPASS GRAFT     PROSTATECTOMY     RETROPUBIC RADICAL PROSTATECTOMY  06/ 2000  in [place], [place]   RIGHT/LEFT HEART CATH AND CORONARY/GRAFT ANGIOGRAPHY N/A 10/29/2018   Procedure: RIGHT/LEFT HEART CATH AND CORONARY/GRAFT ANGIOGRAPHY;  Surgeon: [name], MD;  Location: [place] CV LAB;  Service: Cardiovascular;  Laterality: N/A;   ROTATOR CUFF REPAIR Left 09/1998   TEE WITHOUT CARDIOVERSION N/A 04/06/2014   Procedure: TRANSESOPHAGEAL ECHOCARDIOGRAM (TEE);  Surgeon: [name], MD;  Location: [facility] ENDOSCOPY;  Service: Cardiovascular;  Laterality: N/A;  mild focal basa LVH of the septum, ef 50-55%, s/p AV annuloplasty ring, elongated chordae, thickened leaflets, mild to mod. MR, multiple small jets/arotic bioprosthetic valve sits well in position, mild AI/ thickened TV w/ mild reurg./mild LA   TEE WITHOUT CARDIOVERSION N/A 10/22/2018   Procedure: TRANSESOPHAGEAL  ECHOCARDIOGRAM (TEE);  Surgeon: [name], MD;  Location: [facility] ENDOSCOPY;  Service: Cardiovascular;  Laterality: N/A;   TEE WITHOUT CARDIOVERSION N/A 11/25/2018   Procedure: TRANSESOPHAGEAL ECHOCARDIOGRAM (TEE);  Surgeon: [name], MD;  Location: [place];  Service: Open Heart Surgery;  Laterality: N/A;   TRANSCATHETER AORTIC VALVE REPLACEMENT, TRANSFEMORAL N/A 11/25/2018   Procedure: TRANSCATHETER AORTIC VALVE REPLACEMENT, TRANSFEMORAL;  Surgeon: [name], MD;  Location: [place];  Service: Open Heart Surgery;  Laterality: N/A;   TRANSTHORACIC ECHOCARDIOGRAM  03-23-2016   dr Irish Lack   EF 55-60%, reduced contribution atrial contraction to ventricular filling, due to increased ventricular diastolic pressure or atrial contratile dysfunction/  bioprosthetic AV w/ mod. regurg. (valve area 1.97cm^2)/ mild dilated ascending aorta/ mild thicken MV normal function annular ring prosthesis/severe LAE & mod. to sev.RAE/ PASP 24mHg/ mild TR/ ventricle septal motion show paradox   TRANSURETHRAL RESECTION OF BLADDER TUMOR N/A 02/13/2016   Procedure: TRANSURETHRAL RESECTION OF BLADDER TUMOR (TURBT);  Surgeon: [name] MD;  [facility]  Service: Urology;  Laterality: N/A;   TRANSURETHRAL RESECTION OF BLADDER TUMOR N/A 09/04/2016   Procedure: TRANSURETHRAL RESECTION OF BLADDER TUMOR (TURBT);  Surgeon: [name] MD;  [facility]  Service: Urology;  Laterality: N/A;    There were no vitals filed for this visit.   Subjective Assessment - 01/04/21 0931     Subjective no falls, "I have felt pretty steady"    Currently in Pain? No/denies                [facility] - 01/04/21 0001       Timed Up and Go Test   Normal TUG (seconds) 13                           [facility] Adult PT Treatment/Exercise - 01/04/21 0001       Ambulation/Gait   Gait Comments outside around the back building, trying to  maintain a good pace and have him have a conversation while walking      High Level Balance   High Level Balance Comments walking ball toss fwd , backward, side stepping, ball kicks, on airex head turns, eyes closed, on airex reaching activities      Knee/Hip Exercises: Aerobic   Nustep level 5 x 6 minutes      Knee/Hip Exercises: Machines for Strengthening   Cybex Knee Extension 10# 2x12    Cybex Knee Flexion 25# 2x12      Knee/Hip Exercises: Seated   Sit to Sand 2 sets;10 reps;without UE support   with weighted ball overhead reach  PT Short Term Goals - 12/07/20 1701       PT SHORT TERM GOAL #1   Title Pt will be I and compliant with initial HEP.    Time 2    Period Weeks    Status New               PT Long Term Goals - 01/04/21 1024       PT LONG TERM GOAL #3   Title decrease TUG time to 12 seconds    Status Partially Met                   Plan - 01/04/21 1022     Clinical Impression Statement Patient reports feeling good overall, did well with the walk outside on asphalt surface, some loss of balance on the grass and uneven terrain, he was able to right.  Struggles are with the dynamic surface, always falls backward.  On airex and head turns are really where he had issues, tends to be back on heel and then with difficulty picking up feet to take the step    PT Next Visit Plan continue to work on strength , balance, function and safety    Consulted and Agree with Plan of Care Patient             Patient will benefit from skilled therapeutic intervention in order to improve the following deficits and impairments:  Abnormal gait, Decreased coordination, Difficulty walking, Cardiopulmonary status limiting activity, Decreased endurance, Decreased activity tolerance, Decreased balance, Postural dysfunction, Decreased strength, Decreased mobility  Visit Diagnosis: Muscle weakness (generalized)  Difficulty in walking, not  elsewhere classified  Other lack of coordination     Problem List Patient Active Problem List   Diagnosis Date Noted   Cerebral thrombosis with cerebral infarction 11/25/2020   Acute right-sided weakness 11/24/2020   History of mitral valve repair 11/26/2018   Prosthetic valve dysfunction 11/25/2018   Ectatic abdominal aorta ([place]) 11/25/2018   S/P valve-in-valve TAVR 11/25/2018   History of bladder cancer    History of pulmonary embolus (PE)    Acute on chronic diastolic heart failure ([place]) 04/23/2018   History of aortic valve replacement 04/23/2018   Atrial fibrillation ([place]) 04/23/2018   Coronary atherosclerosis of native coronary artery 02/25/2014   Hyperlipidemia 02/25/2014   Embolism and thrombosis ([place]) 03/11/2013    Sumner Boast., PT 01/04/2021, 10:25 AM  [place] [place], [name], [address] Phone: [phone]   Fax:  [phone]  Name: KEYSHUN ELPERS MRN: [id number] Date of Birth: December 07, 1929

## 2021-01-06 ENCOUNTER — Encounter: Payer: Self-pay | Admitting: Rehabilitative and Restorative Service Providers"

## 2021-01-06 ENCOUNTER — Ambulatory Visit: Payer: Medicare Other | Admitting: Occupational Therapy

## 2021-01-06 ENCOUNTER — Other Ambulatory Visit: Payer: Self-pay

## 2021-01-06 ENCOUNTER — Encounter: Payer: Self-pay | Admitting: Occupational Therapy

## 2021-01-06 ENCOUNTER — Ambulatory Visit: Payer: Medicare Other | Admitting: Rehabilitative and Restorative Service Providers"

## 2021-01-06 DIAGNOSIS — R41842 Visuospatial deficit: Secondary | ICD-10-CM

## 2021-01-06 DIAGNOSIS — R262 Difficulty in walking, not elsewhere classified: Secondary | ICD-10-CM

## 2021-01-06 DIAGNOSIS — M6281 Muscle weakness (generalized): Secondary | ICD-10-CM | POA: Diagnosis not present

## 2021-01-06 DIAGNOSIS — I69319 Unspecified symptoms and signs involving cognitive functions following cerebral infarction: Secondary | ICD-10-CM

## 2021-01-06 DIAGNOSIS — R278 Other lack of coordination: Secondary | ICD-10-CM

## 2021-01-06 NOTE — Therapy (Signed)
[place] [address]. [place], [name], [address] Phone: [phone]   Fax:  [phone]  Occupational Therapy Treatment  Patient Details  Name: Eric [name] MRN: [id number] Date of Birth: Dec 18, 1929 Referring Provider (OT): Debbe Odea, MD   Encounter Date: 01/06/2021   OT End of Session - 01/06/21 0851     Visit Number 7    Number of Visits 13    Date for OT Re-Evaluation 02/27/21    Authorization Type [employer] ($10 copay/day)    Authorization Time Period VL: [place]    Progress Note Due on Visit 10    OT Start Time [phone]   pt arrival time   OT Stop Time 0929    OT Time Calculation (min) 40 min    Activity Tolerance Patient tolerated treatment well    Behavior During Therapy [facility] for tasks assessed/performed            Past Medical History:  Diagnosis Date   Atrial flutter, paroxysmal ([place])    a. dx 04-02-2014, s/p  successful cardioversion 04-06-2014.   Chronic anticoagulation    on Eliquis--  due to recurrent dvt's and pe's   Dyslipidemia    History of bladder cancer urologist-  dr [name]   02-13-2016  s/p TURBT per path high grade papillary urothelial carcinoma   History of DVT (deep vein thrombosis)    2000-- RLE   History of melanoma excision    left flank;  07/ 2014 left lower leg and right upper chest   History of prostate cancer    Gleason 6--  s/p  radical prostatectomy 06/ 2000 in [place]---  no recurrence   History of pulmonary embolus (PE)    2000 & 2005  bilateral   Hx of valvuloplasty 06/19/2007   a. 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 05/2007 CABG x 1: s/p SVG-OM;  b. 10/2010 Ex MV: EF 72%, inf attenuation w/o ischemia,  brief run of PAT with exercise. To   Stroke ([place])    Thrombocytopenia ([place])    Thyroid goiter 2013   nodular   Wears hearing aid    BILATERAL   Wears partial dentures    UPPER    Past Surgical History:  Procedure Laterality Date   AORTIC VALVE REPLACEMENT (AVR)/CORONARY ARTERY BYPASS GRAFTING (CABG)  06-19-2007  dr [name]   SVG to OM1 ;   AVR w/ #23 Mitralflow pericardial and ligation left atrial appendage;  MV repair w/ #28 Sorin 3-D memo Ring Annuloplasty with repair ruptured chordar of P-2 flail segments   CARDIAC CATHETERIZATION  05-12-2007  dr Leonia Reeves   single vessel 30% ostial LAD,  80% LCx;  severe to critial AS,  moderate MR,  normal LVF, ef 70%,  normal right heart pressures and cardiac outputs,  mild elevated LV end-diastolic pressure     CARDIOVASCULAR STRESS TEST  11-02-2010  dr Irish Lack   normal nuclear study w/ no ischemia or infarct/scar/  normal LV function and wall motion , ef 72%   CARDIOVERSION N/A 04/06/2014   Procedure: CARDIOVERSION;  Surgeon: [name], MD;  Location: [place];  Service: Cardiovascular;  Laterality: N/A;   successful   CATARACT EXTRACTION W/  INTRAOCULAR LENS  IMPLANT, BILATERAL  2012  approx   CORONARY ARTERY BYPASS GRAFT     PROSTATECTOMY     RETROPUBIC RADICAL PROSTATECTOMY  06/ 2000  in [place]   RIGHT/LEFT HEART CATH AND CORONARY/GRAFT ANGIOGRAPHY N/A 10/29/2018   Procedure: RIGHT/LEFT HEART CATH AND CORONARY/GRAFT ANGIOGRAPHY;  Surgeon: [name], MD;  Location: [place] CV LAB;  Service: Cardiovascular;  Laterality: N/A;   ROTATOR CUFF REPAIR Left 09/1998   TEE WITHOUT CARDIOVERSION N/A 04/06/2014   Procedure: TRANSESOPHAGEAL ECHOCARDIOGRAM (TEE);  Surgeon: [name], MD;  Location: [facility] ENDOSCOPY;  Service: Cardiovascular;  Laterality: N/A;  mild focal basa LVH of the septum, ef 50-55%, s/p AV annuloplasty ring, elongated chordae, thickened leaflets, mild to mod. MR, multiple small jets/arotic bioprosthetic  valve sits well in position, mild AI/ thickened TV w/ mild reurg./mild LA   TEE WITHOUT CARDIOVERSION N/A 10/22/2018   Procedure: TRANSESOPHAGEAL ECHOCARDIOGRAM (TEE);  Surgeon: [name], MD;  Location[facility];  Service: Cardiovascular;  Laterality: N/A;   TEE WITHOUT CARDIOVERSION N/A 11/25/2018   Procedure: TRANSESOPHAGEAL ECHOCARDIOGRAM (TEE);  Surgeon: [name], MD;  Location: [place];  Service: Open Heart Surgery;  Laterality: N/A;   TRANSCATHETER AORTIC VALVE REPLACEMENT, TRANSFEMORAL N/A 11/25/2018   Procedure: TRANSCATHETER AORTIC VALVE REPLACEMENT, TRANSFEMORAL;  Surgeon: [name], MD;  Location: [place];  Service: Open Heart Surgery;  Laterality: N/A;   TRANSTHORACIC ECHOCARDIOGRAM  03-23-2016   dr Irish Lack   EF 55-60%, reduced contribution atrial contraction to ventricular filling, due to increased ventricular diastolic pressure or atrial contratile dysfunction/  bioprosthetic AV w/ mod. regurg. (valve area 1.97cm^2)/ mild dilated ascending aorta/ mild thicken MV normal function annular ring prosthesis/severe LAE & mod. to sev.RAE/ PASP 7mHg/ mild TR/ ventricle septal motion show paradox   TRANSURETHRAL RESECTION OF BLADDER TUMOR N/A 02/13/2016   Procedure: TRANSURETHRAL RESECTION OF BLADDER TUMOR (TURBT);  Surgeon: [name] MD;  [facility]  Service: Urology;  Laterality: N/A;   TRANSURETHRAL RESECTION OF BLADDER TUMOR N/A 09/04/2016   Procedure: TRANSURETHRAL RESECTION OF BLADDER TUMOR (TURBT);  Surgeon: [name] MD;  [facility]  Service: Urology;  Laterality: N/A;    There were no vitals filed for this visit.   Subjective Assessment - 01/06/21 0850     Subjective  Pt reports he feels like OT has been going well so far and that he continues to be concerned about his balance    Pertinent History Lt frontal centrum semiovale infarct 2/2 to small vessel disease (onset 11/23/20), DVT/PE,  paroxysmal a-fib on Eliquis, aortic valve replacement (x2) w/ bioprosthetic cardiac valve, mitral ring annuloplasty, TAVR, CAD, Lt rotator cuff repair sx (May 2000), HLD    Currently in Pain? No/denies             Treatment/Exercises - 01/06/21    ADLs: Medication Management Med mgmt activity completed w/ AE for functional sequencing, attention, and [place] pt able to complete task correctly w/ 1 drop.    Pegboard Activity Copying pattern onto pegboard w/ easy-grip pegs to facilitate FMC, alternating attention, hand strengthening, sequencing, and eye-hand coordination; pt able to complete activity w/ Mod I and no drops  Picking up marbles at least 5 at a time, translating each to fingertips, and placing on easy-grip pegs to facilitate in-hand manipulation, [place] and intrinsic hand strengthening. Completed w/ min drops; increased success with decreased speed. To return marbles to container, pt picked up off of pegs 5 at  a time using 2-point pinch to return to container     BUE Strengthening: Seated Single-arm shoulder flexion against resistance (yellow theraband) for light strengthening; pt instructed to complete arc of motion within pain free range and w/out shoulder elevation (RUE). Able to achieve 90* w/ LUE and approx 75* w/ RUE         OT Education - 01/06/21 0912     Education Details Reviewed progress toward goals and POC    Person(s) Educated Patient    Methods Explanation    Comprehension Verbalized understanding             OT Short Term Goals - 12/27/20 1614       OT SHORT TERM GOAL #1   Title Pt will verbalize understanding of memory compensatory strategies to improve safety at home    Baseline Recent limitations w/ memory    Time 3    Period Weeks    Status Achieved   12/13/20   Target Date 12/23/20      OT SHORT TERM GOAL #2   Title Pt will identify at least 7/10 targets during environmental scanning activity w/out assist to improve safety w/ functional mobility     Time 3    Period Weeks    Status Achieved   12/20/20 - located 8/10 targets independently     OT SHORT TERM GOAL #3   Title Pt will improve coordination as evidenced by decreasing 9-HPT time by at least 5 sec w/ R, dominant hand    Baseline R hand 48 sec; L hand 36 sec    Time 3    Period Weeks    Status Achieved   12/27/20 - 41 sec; 12/20/20 - RUE 44 sec     OT SHORT TERM GOAL #4   Title Pt will demonstrate understanding of compensatory strategies, including use of AE, to implement during BADLs/IADLs to increase safety and independence prn    Baseline Decreased knowledge of compensatory strategies    Time 3    Period Weeks    Status Achieved   12/20/20 - pt reports he is satsfied w/ participation in functional activities at this time            OT Long Term Goals - 12/07/20 1537       OT LONG TERM GOAL #1   Title Pt will be independent w/ HEP designed for [facility] and coordination by discharge    Baseline No HEP at this time    Time 6    Period Weeks    Status On-going      OT LONG TERM GOAL #2   Title Pt will improve coordination as evidenced by decreasing 9-HPT time by at least 9 sec w/ R, dominant hand    Baseline R hand 48 sec; L hand 36 sec    Time 6    Period Weeks    Status On-going      OT LONG TERM GOAL #3   Title Pt will write a sentence w/ at least 75% legibility and sizing in pt-determined acceptable level of time, using AE prn    Baseline Pt reports micrographia and increased time; pt keeps a written blog    Time 6    Period Weeks    Status On-going      OT LONG TERM GOAL #4   Title Pt will be able to sequence through an IADL task (e.g., med mgmt, housekeeping) w/ Mod I    Baseline Decreased organization of  thought and sequencing    Time 6    Period Weeks    Status On-going             Plan - 01/06/21 [id number]     Clinical Impression Statement Pt demo'd improvement in FMC/dexterity this session, able to complete upgraded pegboard activity w/ minimal drops.  OT also discussed POC for remaining sessions due to pt's progress and introduced light shoulder strengthening w/ yellow theraband; exercises/handout not administered this session to ensure understanding/able to complete exercises w/out discomfort before including BUE strengthening in HEP. Pt also experienced difficulty w/ instruction during med mgmt against; uncertain if this is due to cognitive deficits or decreased clarity of instruction.    OT Occupational Profile and History Detailed Assessment- Review of Records and additional review of physical, cognitive, psychosocial history related to current functional performance    Occupational performance deficits (Please refer to evaluation for details): ADL's;IADL's;Leisure;Social Participation    Body Structure / Function / Physical Skills ADL;Decreased knowledge of use of DME;Gait;Strength;Balance;Dexterity;GMC;UE functional use;Body mechanics;Hearing;Cardiopulmonary status limiting activity;Endurance;IADL;ROM;Vision;Coordination;Mobility;Sensation;FMC;Decreased knowledge of precautions    Cognitive Skills Attention;Memory;Problem Solve;Safety Awareness;Sequencing    Psychosocial Skills Environmental  Adaptations    Rehab Potential Good    Clinical Decision Making Several treatment options, min-mod task modification necessary    Comorbidities Affecting Occupational Performance: Presence of comorbidities impacting occupational performance    Modification or Assistance to Complete Evaluation  Min-Moderate modification of tasks or assist with assess necessary to complete eval    OT Frequency 2x / week   May reduce frequency to 1x/week depending on progress   OT Duration 6 weeks    OT Treatment/Interventions Aquatic Therapy;Self-care/ADL training;DME and/or AE instruction;Balance training;Therapeutic activities;Cognitive remediation/compensation;Therapeutic exercise;Neuromuscular education;Functional Mobility Training;Visual/perceptual  remediation/compensation;Patient/family education;Manual Therapy;Energy conservation;Electrical Stimulation    Plan BUE stregnthening (shoulder and scapular stabilization) exercises w/ yellow theraband; continue to facilitate executive functioning (catch and name, scavenger hunt, IADLs)    Consulted and Agree with Plan of Care Patient;Family member/caregiver    Family Member Consulted Lelon Frohlich (wife)             Patient will benefit from skilled therapeutic intervention in order to improve the following deficits and impairments:   Body Structure / Function / Physical Skills: ADL, Decreased knowledge of use of DME, Gait, Strength, Balance, Dexterity, GMC, UE functional use, Body mechanics, Hearing, Cardiopulmonary status limiting activity, Endurance, IADL, ROM, Vision, Coordination, Mobility, Sensation, FMC, Decreased knowledge of precautions Cognitive Skills: Attention, Memory, Problem Solve, Safety Awareness, Sequencing Psychosocial Skills: Environmental  Adaptations   Visit Diagnosis: Other lack of coordination  Unspecified symptoms and signs involving cognitive functions following cerebral infarction  Visuospatial deficit  Muscle weakness (generalized)    Problem List Patient Active Problem List   Diagnosis Date Noted   Cerebral thrombosis with cerebral infarction 11/25/2020   Acute right-sided weakness 11/24/2020   History of mitral valve repair 11/26/2018   Prosthetic valve dysfunction 11/25/2018   Ectatic abdominal aorta ([place]) 11/25/2018   S/P valve-in-valve TAVR 11/25/2018   History of bladder cancer    History of pulmonary embolus (PE)    Acute on chronic diastolic heart failure ([place]) 04/23/2018   History of aortic valve replacement 04/23/2018   Atrial fibrillation ([place]) 04/23/2018   Coronary atherosclerosis of native coronary artery 02/25/2014   Hyperlipidemia 02/25/2014   Embolism and thrombosis ([place]) 03/11/2013     Kathrine Cords, OTR/L, MSOT  01/06/2021, 10:20  AM  [address]. [place], [name],  [id number] Phone: [phone]   Fax:  [phone]  Name: Eric [name] MRN: [id number] Date of Birth: 1929-08-18

## 2021-01-06 NOTE — Therapy (Signed)
[place] [place]. [place], [name], [address] Phone: [phone]   Fax:  [phone]  Physical Therapy Treatment  Patient Details  Name: Eric [name] MRN: [id number] Date of Birth: Dec 04, 1929 Referring Provider (PT): Debbe Odea   Encounter Date: 01/06/2021   PT End of Session - 01/06/21 1020     Visit Number 8    Date for PT Re-Evaluation 03/09/21    Authorization Type [employer]    PT Start Time 0930    PT Stop Time 1010    PT Time Calculation (min) 40 min    Activity Tolerance Patient tolerated treatment well    Behavior During Therapy [facility] for tasks assessed/performed             Past Medical History:  Diagnosis Date   Atrial flutter, paroxysmal ([place])    a. dx 04-02-2014, s/p  successful cardioversion 04-06-2014.   Chronic anticoagulation    on Eliquis--  due to recurrent dvt's and pe's   Dyslipidemia    History of bladder cancer urologist-  dr [name]   02-13-2016  s/p TURBT per path high grade papillary urothelial carcinoma   History of DVT (deep vein thrombosis)    2000-- RLE   History of melanoma excision    left flank;  07/ 2014 left lower leg and right upper chest   History of prostate cancer    Gleason 6--  s/p  radical prostatectomy 06/ 2000 in [place]---  no recurrence   History of pulmonary embolus (PE)    2000 & 2005  bilateral   Hx of valvuloplasty 06/19/2007   a. 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 05/2007 CABG x 1: s/p SVG-OM;  b. 10/2010 Ex MV: EF 72%, inf attenuation w/o ischemia, brief run of PAT with exercise. To   Stroke ([place])    Thrombocytopenia ([place])    Thyroid goiter 2013   nodular   Wears hearing aid    BILATERAL    Wears partial dentures    UPPER    Past Surgical History:  Procedure Laterality Date   AORTIC VALVE REPLACEMENT (AVR)/CORONARY ARTERY BYPASS GRAFTING (CABG)  06-19-2007  dr [name]   SVG to OM1 ;   AVR w/ #23 Mitralflow pericardial and ligation left atrial appendage;  MV repair w/ #28 Sorin 3-D memo Ring Annuloplasty with repair ruptured chordar of P-2 flail segments   CARDIAC CATHETERIZATION  05-12-2007  dr Leonia Reeves   single vessel 30% ostial LAD,  80% LCx;  severe to critial AS,  moderate MR,  normal LVF, ef 70%,  normal right heart pressures and cardiac outputs,  mild elevated LV end-diastolic pressure     CARDIOVASCULAR STRESS TEST  11-02-2010  dr Irish Lack   normal nuclear study w/ no ischemia or infarct/scar/  normal LV function and wall motion , ef 72%   CARDIOVERSION N/A 04/06/2014   Procedure: CARDIOVERSION;  Surgeon: [name], MD;  Location: [place];  Service: Cardiovascular;  Laterality: N/A;   successful   CATARACT EXTRACTION W/ INTRAOCULAR LENS  IMPLANT, BILATERAL  2012  approx   CORONARY ARTERY BYPASS GRAFT     PROSTATECTOMY     RETROPUBIC RADICAL PROSTATECTOMY  06/ 2000  in [place]   RIGHT/LEFT HEART CATH AND CORONARY/GRAFT ANGIOGRAPHY N/A 10/29/2018   Procedure: RIGHT/LEFT HEART CATH AND CORONARY/GRAFT ANGIOGRAPHY;  Surgeon: [name], MD;  Location: [place] CV LAB;  Service: Cardiovascular;  Laterality: N/A;   ROTATOR CUFF REPAIR Left 09/1998   TEE WITHOUT CARDIOVERSION N/A 04/06/2014   Procedure: TRANSESOPHAGEAL ECHOCARDIOGRAM (TEE);  Surgeon: [name], MD;  [facility] END[facility]COPY;  Service: Cardiovascular;  Laterality: N/A;  mild focal basa LVH of the septum, ef 50-55%, s/p AV annuloplasty ring, elongated chordae, thickened leaflets, mild to mod. MR, multiple small jets/arotic bioprosthetic valve sits well in position, mild AI/ thickened TV w/ mild reurg./mild LA   TEE WITHOUT CARDIOVERSION N/A 10/22/2018   Procedure: TRANSESOPHAGEAL  ECHOCARDIOGRAM (TEE);  Surgeon: [name], MD;  Location: [facility] ENDOSCOPY;  Service: Cardiovascular;  Laterality: N/A;   TEE WITHOUT CARDIOVERSION N/A 11/25/2018   Procedure: TRANSESOPHAGEAL ECHOCARDIOGRAM (TEE);  Surgeon: [name], MD;  Location: [place];  Service: Open Heart Surgery;  Laterality: N/A;   TRANSCATHETER AORTIC VALVE REPLACEMENT, TRANSFEMORAL N/A 11/25/2018   Procedure: TRANSCATHETER AORTIC VALVE REPLACEMENT, TRANSFEMORAL;  Surgeon: [name], MD;  Location: [place];  Service: Open Heart Surgery;  Laterality: N/A;   TRANSTHORACIC ECHOCARDIOGRAM  03-23-2016   dr Irish Lack   EF 55-60%, reduced contribution atrial contraction to ventricular filling, due to increased ventricular diastolic pressure or atrial contratile dysfunction/  bioprosthetic AV w/ mod. regurg. (valve area 1.97cm^2)/ mild dilated ascending aorta/ mild thicken MV normal function annular ring prosthesis/severe LAE & mod. to sev.RAE/ PASP 35mHg/ mild TR/ ventricle septal motion show paradox   TRANSURETHRAL RESECTION OF BLADDER TUMOR N/A 02/13/2016   Procedure: TRANSURETHRAL RESECTION OF BLADDER TUMOR (TURBT);  Surgeon: [name] MD;  [facility]  Service: Urology;  Laterality: N/A;   TRANSURETHRAL RESECTION OF BLADDER TUMOR N/A 09/04/2016   Procedure: TRANSURETHRAL RESECTION OF BLADDER TUMOR (TURBT);  Surgeon: [name] MD;  [facility]  Service: Urology;  Laterality: N/A;    There were no vitals filed for this visit.   Subjective Assessment - 01/06/21 1016     Subjective I have no had any falls    Patient Stated Goals be more stable with walking, safer no falls, feel stronger and better with walking    Currently in Pain? No/denies                               [facility] - 01/06/21 0001       High Level Balance   High Level Balance Activities Side stepping;Direction changes;Figure 8  turns;Tandem walking   on large black mat   High Level Balance Comments Walking ball toss with fwd and side stepping      Knee/Hip Exercises: Aerobic   Nustep level 5 x 6 minutes, LE only      Knee/Hip Exercises: Machines for Strengthening   Cybex Knee Extension 10# 2x12    Cybex Knee Flexion 25# 2x12      Knee/Hip Exercises: Standing   Other Standing Knee Exercises Alt 6" box taps 2x10      Knee/Hip Exercises: Seated   Sit to Sand 2 sets;10 reps;without UE support   with yellow wt ball CP                     PT Short Term Goals - 01/06/21 1032  PT SHORT TERM GOAL #1   Title Pt will be I and compliant with initial HEP.    Status Achieved               PT Long Term Goals - 01/06/21 1033       PT LONG TERM GOAL #1   Title Pt will be I and compliant with HEP.    Status On-going                   Plan - 01/06/21 1021     Clinical Impression Statement Patient continues with impulsive behaviors noted, with one time of going from stand to sit where he did not turn as much and only half sat on chair with one buttocks and had to correct himself.  Pt continues with instability noted, especially with ambulating on noncompliant mat surface.  He continues to require cuing for safety throughout.    PT Treatment/Interventions ADLs/Self Care Home Management;Gait training;Neuromuscular re-education;Balance training;Therapeutic exercise;Therapeutic activities;Functional mobility training;Stair training;Patient/family education;Manual techniques    PT Next Visit Plan continue to work on strength , balance, function and safety    Consulted and Agree with Plan of Care Patient             Patient will benefit from skilled therapeutic intervention in order to improve the following deficits and impairments:  Abnormal gait, Decreased coordination, Difficulty walking, Cardiopulmonary status limiting activity, Decreased endurance, Decreased activity tolerance,  Decreased balance, Postural dysfunction, Decreased strength, Decreased mobility  Visit Diagnosis: Muscle weakness (generalized)  Difficulty in walking, not elsewhere classified  Other lack of coordination     Problem List Patient Active Problem List   Diagnosis Date Noted   Cerebral thrombosis with cerebral infarction 11/25/2020   Acute right-sided weakness 11/24/2020   History of mitral valve repair 11/26/2018   Prosthetic valve dysfunction 11/25/2018   Ectatic abdominal aorta ([place]) 11/25/2018   S/P valve-in-valve TAVR 11/25/2018   History of bladder cancer    History of pulmonary embolus (PE)    Acute on chronic diastolic heart failure ([place]) 04/23/2018   History of aortic valve replacement 04/23/2018   Atrial fibrillation ([place]) 04/23/2018   Coronary atherosclerosis of native coronary artery 02/25/2014   Hyperlipidemia 02/25/2014   Embolism and thrombosis ([place]) 03/11/2013    Juel Burrow, PT, DPT 01/06/2021, 10:50 AM  [place]. [place], [name], [address] Phone: [phone]   Fax:  [phone]  Name: Eric [name] MRN: [id number] Date of Birth: Dec 26, 1929

## 2021-01-10 ENCOUNTER — Ambulatory Visit: Payer: Medicare Other | Admitting: Occupational Therapy

## 2021-01-10 ENCOUNTER — Ambulatory Visit: Payer: Medicare Other | Admitting: Physical Therapy

## 2021-01-10 ENCOUNTER — Encounter: Payer: Self-pay | Admitting: Physical Therapy

## 2021-01-10 ENCOUNTER — Other Ambulatory Visit: Payer: Self-pay

## 2021-01-10 DIAGNOSIS — M6281 Muscle weakness (generalized): Secondary | ICD-10-CM

## 2021-01-10 DIAGNOSIS — R262 Difficulty in walking, not elsewhere classified: Secondary | ICD-10-CM

## 2021-01-10 DIAGNOSIS — R278 Other lack of coordination: Secondary | ICD-10-CM

## 2021-01-10 NOTE — Therapy (Signed)
[place]. [place], [name], [address] Phone: [phone]   Fax:  [phone]  Physical Therapy Treatment  Patient Details  Name: Eric [name] MRN: [id number] Date of Birth: March 11, 1930 Referring Provider (PT): Debbe Odea   Encounter Date: 01/10/2021   PT End of Session - 01/10/21 1054     Visit Number 9    Date for PT Re-Evaluation 03/09/21    Authorization Type U[employer] Medicare    PT Start Time 0930    PT Stop Time [id number]    PT Time Calculation (min) 44 min    Activity Tolerance Patient tolerated treatment well    Behavior During Therapy [facility] for tasks assessed/performed             Past Medical History:  Diagnosis Date   Atrial flutter, paroxysmal ([place])    a. dx 04-02-2014, s/p  successful cardioversion 04-06-2014.   Chronic anticoagulation    on Eliquis--  due to recurrent dvt's and pe's   Dyslipidemia    History of bladder cancer urologist-  dr [name]   02-13-2016  s/p TURBT per path high grade papillary urothelial carcinoma   History of DVT (deep vein thrombosis)    2000-- RLE   History of melanoma excision    left flank;  07/ 2014 left lower leg and right upper chest   History of prostate cancer    Gleason 6--  s/p  radical prostatectomy 06/ 2000 in [place]---  no recurrence   History of pulmonary embolus (PE)    2000 & 2005  bilateral   Hx of valvuloplasty 06/19/2007   a. 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 05/2007 CABG x 1: s/p SVG-OM;  b. 10/2010 Ex MV: EF 72%, inf attenuation w/o ischemia, brief run of PAT with exercise. To   Stroke ([place])    Thrombocytopenia ([place])    Thyroid goiter 2013   nodular   Wears hearing aid    BILATERAL    Wears partial dentures    UPPER    Past Surgical History:  Procedure Laterality Date   AORTIC VALVE REPLACEMENT (AVR)/CORONARY ARTERY BYPASS GRAFTING (CABG)  06-19-2007  dr [name]   SVG to OM1 ;   AVR w/ #23 Mitralflow pericardial and ligation left atrial appendage;  MV repair w/ #28 Sorin 3-D memo Ring Annuloplasty with repair ruptured chordar of P-2 flail segments   CARDIAC CATHETERIZATION  05-12-2007  dr Leonia Reeves   single vessel 30% ostial LAD,  80% LCx;  severe to critial AS,  moderate MR,  normal LVF, ef 70%,  normal right heart pressures and cardiac outputs,  mild elevated LV end-diastolic pressure     CARDIOVASCULAR STRESS TEST  11-02-2010  dr Irish Lack   normal nuclear study w/ no ischemia or infarct/scar/  normal LV function and wall motion , ef 72%   CARDIOVERSION N/A 04/06/2014   Procedure: CARDIOVERSION;  Surgeon: [name], MD;  Location: [place];  Service: Cardiovascular;  Laterality: N/A;   successful   CATARACT EXTRACTION W/ INTRAOCULAR LENS  IMPLANT, BILATERAL  2012  approx   CORONARY ARTERY BYPASS GRAFT     PROSTATECTOMY     RETROPUBIC RADICAL PROSTATECTOMY  06/ 2000  in [place], [place]   RIGHT/LEFT HEART CATH AND CORONARY/GRAFT ANGIOGRAPHY N/A 10/29/2018   Procedure: RIGHT/LEFT HEART CATH AND CORONARY/GRAFT ANGIOGRAPHY;  Surgeon: [name], MD;  Location: [place] CV LAB;  Service: Cardiovascular;  Laterality: N/A;   ROTATOR CUFF REPAIR Left 09/1998   TEE WITHOUT CARDIOVERSION N/A 04/06/2014   Procedure: TRANSESOPHAGEAL ECHOCARDIOGRAM (TEE);  Surgeon: [name], MD;  Location: [facility] ENDOSCOPY;  Service: Cardiovascular;  Laterality: N/A;  mild focal basa LVH of the septum, ef 50-55%, s/p AV annuloplasty ring, elongated chordae, thickened leaflets, mild to mod. MR, multiple small jets/arotic bioprosthetic valve sits well in position, mild AI/ thickened TV w/ mild reurg./mild LA   TEE WITHOUT CARDIOVERSION N/A 10/22/2018   Procedure: TRANSESOPHAGEAL  ECHOCARDIOGRAM (TEE);  Surgeon: [name], MD;  Location: [facility] ENDOSCOPY;  Service: Cardiovascular;  Laterality: N/A;   TEE WITHOUT CARDIOVERSION N/A 11/25/2018   Procedure: TRANSESOPHAGEAL ECHOCARDIOGRAM (TEE);  Surgeon: [name], MD;  Location: [place];  Service: Open Heart Surgery;  Laterality: N/A;   TRANSCATHETER AORTIC VALVE REPLACEMENT, TRANSFEMORAL N/A 11/25/2018   Procedure: TRANSCATHETER AORTIC VALVE REPLACEMENT, TRANSFEMORAL;  Surgeon: [name], MD;  Location: [place];  Service: Open Heart Surgery;  Laterality: N/A;   TRANSTHORACIC ECHOCARDIOGRAM  03-23-2016   dr Irish Lack   EF 55-60%, reduced contribution atrial contraction to ventricular filling, due to increased ventricular diastolic pressure or atrial contratile dysfunction/  bioprosthetic AV w/ mod. regurg. (valve area 1.97cm^2)/ mild dilated ascending aorta/ mild thicken MV normal function annular ring prosthesis/severe LAE & mod. to sev.RAE/ PASP 49mHg/ mild TR/ ventricle septal motion show paradox   TRANSURETHRAL RESECTION OF BLADDER TUMOR N/A 02/13/2016   Procedure: TRANSURETHRAL RESECTION OF BLADDER TUMOR (TURBT);  Surgeon: [name] MD;  [facility]  Service: Urology;  Laterality: N/A;   TRANSURETHRAL RESECTION OF BLADDER TUMOR N/A 09/04/2016   Procedure: TRANSURETHRAL RESECTION OF BLADDER TUMOR (TURBT);  Surgeon: [name] MD;  [facility]  Service: Urology;  Laterality: N/A;    There were no vitals filed for this visit.   Subjective Assessment - 01/10/21 0935     Subjective no real issues still off balance at times    Currently in Pain? No/denies                               [facility] - 01/10/21 0001       Ambulation/Gait   Gait Comments outside around the back building, trying to maintain a good pace and have him have a conversation while walking      High Level Balance   High Level  Balance Comments standing on airex cone touches hands and feet, the airex was very difficult today but was performed after the stretngth      Knee/Hip Exercises: Aerobic   Recumbent Bike level 4 x 5 minutes      Knee/Hip Exercises: Machines for Strengthening   Cybex Knee Extension 10# 2x12    Cybex Knee Flexion 25# 2x12    Cybex Leg Press 40# 2x10                      PT Short Term Goals - 01/06/21 1032       PT SHORT TERM GOAL #1   Title Pt will be I and compliant with initial HEP.    Status Achieved  PT Long Term Goals - 01/06/21 1033       PT LONG TERM GOAL #1   Title Pt will be I and compliant with HEP.    Status On-going                   Plan - 01/10/21 1055     Clinical Impression Statement Pateint had a great deal of difficulty with balance today, we did the leg strength exercises prior to the balance.  I do not know if this was why he did so poorly today.  Really struggled on airex and narrow BOS activities, needed close CGA for LOB    PT Next Visit Plan continue to work on strength , balance, function and safety    Consulted and Agree with Plan of Care Patient             Patient will benefit from skilled therapeutic intervention in order to improve the following deficits and impairments:  Abnormal gait, Decreased coordination, Difficulty walking, Cardiopulmonary status limiting activity, Decreased endurance, Decreased activity tolerance, Decreased balance, Postural dysfunction, Decreased strength, Decreased mobility  Visit Diagnosis: Muscle weakness (generalized)  Difficulty in walking, not elsewhere classified  Other lack of coordination     Problem List Patient Active Problem List   Diagnosis Date Noted   Cerebral thrombosis with cerebral infarction 11/25/2020   Acute right-sided weakness 11/24/2020   History of mitral valve repair 11/26/2018   Prosthetic valve dysfunction 11/25/2018   Ectatic abdominal  aorta ([place]) 11/25/2018   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 [place], [name], [address] Phone: [phone]   Fax:  [phone]  Name: GIANNCARLO BECHEN MRN: [id number] Date of Birth: 1930-02-04

## 2021-01-10 NOTE — Progress Notes (Signed)
I agree with the above plan 

## 2021-01-12 ENCOUNTER — Ambulatory Visit: Payer: Medicare Other | Admitting: Occupational Therapy

## 2021-01-12 ENCOUNTER — Ambulatory Visit: Payer: Medicare Other | Admitting: Physical Therapy

## 2021-01-12 ENCOUNTER — Encounter: Payer: Self-pay | Admitting: Physical Therapy

## 2021-01-12 ENCOUNTER — Other Ambulatory Visit: Payer: Self-pay

## 2021-01-12 ENCOUNTER — Encounter: Payer: Self-pay | Admitting: Occupational Therapy

## 2021-01-12 DIAGNOSIS — M6281 Muscle weakness (generalized): Secondary | ICD-10-CM

## 2021-01-12 DIAGNOSIS — R41842 Visuospatial deficit: Secondary | ICD-10-CM

## 2021-01-12 DIAGNOSIS — R262 Difficulty in walking, not elsewhere classified: Secondary | ICD-10-CM

## 2021-01-12 DIAGNOSIS — R278 Other lack of coordination: Secondary | ICD-10-CM

## 2021-01-12 DIAGNOSIS — I69319 Unspecified symptoms and signs involving cognitive functions following cerebral infarction: Secondary | ICD-10-CM

## 2021-01-12 NOTE — Therapy (Signed)
[place]. [place], [name], [address] Phone: [phone]   Fax:  [phone] Progress Note Reporting Period 12/07/20 to 01/12/21 for the first 10 visits  See note below for Objective Data and Assessment of Progress/Goals.     Physical Therapy Treatment  Patient Details  Name: Eric [name] MRN: [id number] Date of Birth: 1930/04/09 Referring Provider (PT): Debbe Odea   Encounter Date: 01/12/2021   PT End of Session - 01/12/21 1023     Visit Number 10    Date for PT Re-Evaluation 03/09/21    Authorization Type UHC Medicare    PT Start Time 0930    PT Stop Time 0932    PT Time Calculation (min) 45 min    Activity Tolerance Patient tolerated treatment well    Behavior During Therapy [facility] for tasks assessed/performed             Past Medical History:  Diagnosis Date   Atrial flutter, paroxysmal ([place])    a. dx 04-02-2014, s/p  successful cardioversion 04-06-2014.   Chronic anticoagulation    on Eliquis--  due to recurrent dvt's and pe's   Dyslipidemia    History of bladder cancer urologist-  dr [name]   02-13-2016  s/p TURBT per path high grade papillary urothelial carcinoma   History of DVT (deep vein thrombosis)    2000-- RLE   History of melanoma excision    left flank;  07/ 2014 left lower leg and right upper chest   History of prostate cancer    Gleason 6--  s/p  radical prostatectomy 06/ 2000 in [place]---  no recurrence   History of pulmonary embolus (PE)    2000 & 2005  bilateral   Hx of valvuloplasty 06/19/2007   a. 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 05/2007 CABG x 1: s/p SVG-OM;  b. 10/2010 Ex MV: EF 72%, inf attenuation w/o  ischemia, brief run of PAT with exercise. To   Stroke ([place])    Thrombocytopenia ([place])    Thyroid goiter 2013   nodular   Wears hearing aid    BILATERAL   Wears partial dentures    UPPER    Past Surgical History:  Procedure Laterality Date   AORTIC VALVE REPLACEMENT (AVR)/CORONARY ARTERY BYPASS GRAFTING (CABG)  06-19-2007  dr [name]   SVG to OM1 ;   AVR w/ #23 Mitralflow pericardial and ligation left atrial appendage;  MV repair w/ #28 Sorin 3-D memo Ring Annuloplasty with repair ruptured chordar of P-2 flail segments   CARDIAC CATHETERIZATION  05-12-2007  dr Leonia Reeves   single vessel 30% ostial LAD,  80% LCx;  severe to critial AS,  moderate MR,  normal LVF, ef 70%,  normal right heart pressures and cardiac outputs,  mild elevated LV end-diastolic pressure     CARDIOVASCULAR STRESS TEST  11-02-2010  dr Irish Lack   normal nuclear study w/ no ischemia or infarct/scar/  normal LV function and wall motion , ef 72%   CARDIOVERSION N/A 04/06/2014   Procedure: CARDIOVERSION;  Surgeon: [name], MD;  Location: [place];  Service: Cardiovascular;  Laterality: N/A;   successful   CATARACT EXTRACTION W/ INTRAOCULAR LENS  IMPLANT, BILATERAL  2012  approx   CORONARY ARTERY BYPASS GRAFT     PROSTATECTOMY     RETROPUBIC RADICAL PROSTATECTOMY  06/ 2000  in [place], [place]   RIGHT/LEFT HEART CATH AND CORONARY/GRAFT ANGIOGRAPHY N/A 10/29/2018   Procedure: RIGHT/LEFT HEART CATH AND CORONARY/GRAFT ANGIOGRAPHY;  Surgeon: [name], MD;  Location: [place] CV LAB;  Service: Cardiovascular;  Laterality: N/A;   ROTATOR CUFF REPAIR Left 09/1998   TEE WITHOUT CARDIOVERSION N/A 04/06/2014   Procedure: TRANSESOPHAGEAL ECHOCARDIOGRAM (TEE);  Surgeon: [name], MD;  Location: [facility] ENDOSCOPY;  Service: Cardiovascular;  Laterality: N/A;  mild focal basa LVH of the septum, ef 50-55%, s/p AV annuloplasty ring, elongated chordae, thickened leaflets, mild to mod. MR, multiple small jets/arotic  bioprosthetic valve sits well in position, mild AI/ thickened TV w/ mild reurg./mild LA   TEE WITHOUT CARDIOVERSION N/A 10/22/2018   Procedure: TRANSESOPHAGEAL ECHOCARDIOGRAM (TEE);  Surgeon: [name], MD;  [facility];  Service: Cardiovascular;  Laterality: N/A;   TEE WITHOUT CARDIOVERSION N/A 11/25/2018   Procedure: TRANSESOPHAGEAL ECHOCARDIOGRAM (TEE);  Surgeon: [name], MD;  Location: [place];  Service: Open Heart Surgery;  Laterality: N/A;   TRANSCATHETER AORTIC VALVE REPLACEMENT, TRANSFEMORAL N/A 11/25/2018   Procedure: TRANSCATHETER AORTIC VALVE REPLACEMENT, TRANSFEMORAL;  Surgeon: [name], MD;  Location: [place];  Service: Open Heart Surgery;  Laterality: N/A;   TRANSTHORACIC ECHOCARDIOGRAM  03-23-2016   dr Irish Lack   EF 55-60%, reduced contribution atrial contraction to ventricular filling, due to increased ventricular diastolic pressure or atrial contratile dysfunction/  bioprosthetic AV w/ mod. regurg. (valve area 1.97cm^2)/ mild dilated ascending aorta/ mild thicken MV normal function annular ring prosthesis/severe LAE & mod. to sev.RAE/ PASP 61mHg/ mild TR/ ventricle septal motion show paradox   TRANSURETHRAL RESECTION OF BLADDER TUMOR N/A 02/13/2016   Procedure: TRANSURETHRAL RESECTION OF BLADDER TUMOR (TURBT);  Surgeon: [name] MD;  [facility]  Service: Urology;  Laterality: N/A;   TRANSURETHRAL RESECTION OF BLADDER TUMOR N/A 09/04/2016   Procedure: TRANSURETHRAL RESECTION OF BLADDER TUMOR (TURBT);  Surgeon: [name] MD;  [facility]  Service: Urology;  Laterality: N/A;    There were no vitals filed for this visit.   Subjective Assessment - 01/12/21 0940     Subjective I think my legs were just too tired for the balance after the last visit    Currently in Pain? No/denies                [facility] - 01/12/21 0001       Timed Up and Go Test   Normal TUG  (seconds) 13                           [facility] Adult PT Treatment/Exercise - 01/12/21 0001       High Level Balance   High Level Balance Activities Direction changes;Marching forwards;Marching backwards    High Level Balance Comments firmer airex standing cone touch with hands and feet, then ball toss, then step into box and out with it in front and then side to side, tried tandem walking, tried NBOS head turns and eyes closed and reaching, tandem standing, worked on more single leg stance with a n 18" box toe touches      Knee/Hip Exercises: Aerobic   Recumbent Bike level 4 x 6 minutes  PT Short Term Goals - 01/06/21 1032       PT SHORT TERM GOAL #1   Title Pt will be I and compliant with initial HEP.    Status Achieved               PT Long Term Goals - 01/12/21 1025       PT LONG TERM GOAL #1   Title Pt will be I and compliant with HEP.      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    PT Next Visit Plan continue to work on strength , balance, function and safety    Consulted and Agree with Plan of Care Patient             Patient will benefit from skilled therapeutic intervention in order to improve the following deficits and impairments:  Abnormal gait, Decreased coordination, Difficulty walking, Cardiopulmonary status limiting activity, Decreased  endurance, Decreased activity tolerance, Decreased balance, Postural dysfunction, Decreased strength, Decreased mobility  Visit Diagnosis: Muscle weakness (generalized)  Difficulty in walking, not elsewhere classified  Other lack of coordination     Problem List Patient Active Problem List   Diagnosis Date Noted   Cerebral thrombosis with cerebral infarction 11/25/2020   Acute right-sided weakness 11/24/2020   History of mitral valve repair 11/26/2018   Prosthetic valve dysfunction 11/25/2018   Ectatic abdominal aorta ([place]) 11/25/2018   S/P valve-in-valve TAVR 11/25/2018   History of bladder cancer    History of pulmonary embolus (PE)    Acute on chronic diastolic heart failure ([place]) 04/23/2018   History of aortic valve replacement 04/23/2018   Atrial fibrillation ([place]) 04/23/2018   Coronary atherosclerosis of native coronary artery 02/25/2014   Hyperlipidemia 02/25/2014   Embolism and thrombosis ([place]) 03/11/2013    Sumner Boast., PT 01/12/2021, 10:26 AM  [place]. [place], [name], [address] Phone: [phone]   Fax:  [phone]  Name: Eric [name] MRN: [id number] Date of Birth: May 20, 1930

## 2021-01-12 NOTE — Patient Instructions (Signed)
Shoulder Flexion: Sitting (Single Arm)    Anchor theraband under foot. Same side hand thumb up, raise arm forward. Repeat 15 times per set. Repeat with other arm. Do 1-2 sets per session. Do 1-2 sessions every other day.   SHOULDER: Extension (Band)    Start with arm slightly forward. Holding band, pull backward to be level with hip or past hip, keeping elbow straight. 15 reps per set, 1-2 sets per session, 1-2 sessions every other day  SHOULDER: External Rotation (Band)   Place towel between elbow and body. Keep elbow next to body. Holding band, rotate arm away from body. 15 reps per set, 1-2 sets per session, 1-2 sessions every other day  SHOULDER: Internal Rotation (Band)    Place towel between elbow and body. Keep elbow next to body. Holding band, rotate arm toward body. 15 reps per set, 1-2 sets per session, 1-2 sessions every other day  Copyright  VHI. All rights reserved.

## 2021-01-13 NOTE — Therapy (Signed)
[place] [place], [name], [address] Phone: [phone]   Fax:  [phone]  Occupational Therapy Treatment  Patient Details  Name: Eric [name] MRN: [id number] Date of Birth: [birth year] Referring Provider (OT): Debbe Odea, MD   Encounter Date: 01/12/2021   OT End of Session - 01/12/21 0849     Visit Number 8    Number of Visits 13    Date for OT Re-Evaluation 02/27/21    Authorization Type [employer] ($10 copay/day)    Authorization Time Period VL: [place]    Progress Note Due on Visit 10    OT Start Time 0847    OT Stop Time [phone]    OT Time Calculation (min) 41 min    Activity Tolerance Patient tolerated treatment well    Behavior During Therapy [facility] for tasks assessed/performed            Past Medical History:  Diagnosis Date   Atrial flutter, paroxysmal ([place])    a. dx 04-02-2014, s/p  successful cardioversion 04-06-2014.   Chronic anticoagulation    on Eliquis--  due to recurrent dvt's and pe's   Dyslipidemia    History of bladder cancer urologist-  dr [name]   02-13-2016  s/p TURBT per path high grade papillary urothelial carcinoma   History of DVT (deep vein thrombosis)    2000-- RLE   History of melanoma excision    left flank;  07/ 2014 left lower leg and right upper chest   History of prostate cancer    Gleason 6--  s/p  radical prostatectomy 06/ 2000 in [place]---  no recurrence   History of pulmonary embolus (PE)    2000 & 2005  bilateral   Hx of valvuloplasty 06/19/2007   a. 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 05/2007 CABG x 1: s/p SVG-OM;  b. 10/2010 Ex MV: EF 72%, inf attenuation w/o ischemia, brief run of PAT  with exercise. To   Stroke ([place])    Thrombocytopenia ([place])    Thyroid goiter 2013   nodular   Wears hearing aid    BILATERAL   Wears partial dentures    UPPER    Past Surgical History:  Procedure Laterality Date   AORTIC VALVE REPLACEMENT (AVR)/CORONARY ARTERY BYPASS GRAFTING (CABG)  06-19-2007  dr [name]   SVG to OM1 ;   AVR w/ #23 Mitralflow pericardial and ligation left atrial appendage;  MV repair w/ #28 Sorin 3-D memo Ring Annuloplasty with repair ruptured chordar of P-2 flail segments   CARDIAC CATHETERIZATION  05-12-2007  dr Leonia Reeves   single vessel 30% ostial LAD,  80% LCx;  severe to critial AS,  moderate MR,  normal LVF, ef 70%,  normal right heart pressures and cardiac outputs,  mild elevated LV end-diastolic pressure     CARDIOVASCULAR STRESS TEST  11-02-2010  dr Irish Lack   normal nuclear study w/ no ischemia or infarct/scar/  normal LV function and wall motion , ef 72%   CARDIOVERSION N/A 04/06/2014   Procedure: CARDIOVERSION;  Surgeon: [name], MD;  Location: [place];  Service: Cardiovascular;  Laterality: N/A;   successful   CATARACT EXTRACTION W/ INTRAOCULAR LENS  IMPLANT,  BILATERAL  2012  approx   CORONARY ARTERY BYPASS GRAFT     PROSTATECTOMY     RETROPUBIC RADICAL PROSTATECTOMY  06/ 2000  in [place], [place]   RIGHT/LEFT HEART CATH AND CORONARY/GRAFT ANGIOGRAPHY N/A 10/29/2018   Procedure: RIGHT/LEFT HEART CATH AND CORONARY/GRAFT ANGIOGRAPHY;  Surgeon: [name], MD;  Location: [place] CV LAB;  Service: Cardiovascular;  Laterality: N/A;   ROTATOR CUFF REPAIR Left 09/1998   TEE WITHOUT CARDIOVERSION N/A 04/06/2014   Procedure: TRANSESOPHAGEAL ECHOCARDIOGRAM (TEE);  Surgeon: [name], MD;  Location: [facility] ENDOSCOPY;  Service: Cardiovascular;  Laterality: N/A;  mild focal basa LVH of the septum, ef 50-55%, s/p AV annuloplasty ring, elongated chordae, thickened leaflets, mild to mod. MR, multiple small jets/arotic bioprosthetic valve sits well in  position, mild AI/ thickened TV w/ mild reurg./mild LA   TEE WITHOUT CARDIOVERSION N/A 10/22/2018   Procedure: TRANSESOPHAGEAL ECHOCARDIOGRAM (TEE);  Surgeon: [name], MD;  Location: [facility] ENDOSCOPY;  Service: Cardiovascular;  Laterality: N/A;   TEE WITHOUT CARDIOVERSION N/A 11/25/2018   Procedure: TRANSESOPHAGEAL ECHOCARDIOGRAM (TEE);  Surgeon: [name], MD;  Location: [place];  Service: Open Heart Surgery;  Laterality: N/A;   TRANSCATHETER AORTIC VALVE REPLACEMENT, TRANSFEMORAL N/A 11/25/2018   Procedure: TRANSCATHETER AORTIC VALVE REPLACEMENT, TRANSFEMORAL;  Surgeon: [name], MD;  Location: [place];  Service: Open Heart Surgery;  Laterality: N/A;   TRANSTHORACIC ECHOCARDIOGRAM  03-23-2016   dr Irish Lack   EF 55-60%, reduced contribution atrial contraction to ventricular filling, due to increased ventricular diastolic pressure or atrial contratile dysfunction/  bioprosthetic AV w/ mod. regurg. (valve area 1.97cm^2)/ mild dilated ascending aorta/ mild thicken MV normal function annular ring prosthesis/severe LAE & mod. to sev.RAE/ PASP 44mHg/ mild TR/ ventricle septal motion show paradox   TRANSURETHRAL RESECTION OF BLADDER TUMOR N/A 02/13/2016   Procedure: TRANSURETHRAL RESECTION OF BLADDER TUMOR (TURBT);  Surgeon: [name] MD;  [facility]  Service: Urology;  Laterality: N/A;   TRANSURETHRAL RESECTION OF BLADDER TUMOR N/A 09/04/2016   Procedure: TRANSURETHRAL RESECTION OF BLADDER TUMOR (TURBT);  Surgeon: [name] MD;  [facility]  Service: Urology;  Laterality: N/A;    There were no vitals filed for this visit.   Subjective Assessment - 01/12/21 0849     Subjective  Pt reported he did not experience any add'l pain/discomfort w/ exercises    Pertinent History Lt frontal centrum semiovale infarct 2/2 to small vessel disease (onset 11/23/20), DVT/PE, paroxysmal a-fib on Eliquis, aortic valve replacement (x2) w/  bioprosthetic cardiac valve, mitral ring annuloplasty, TAVR, CAD, Lt rotator cuff repair sx (May 2000), HLD    Currently in Pain? No/denies             Treatment/Exercises - 01/12/21    Shoulder Exercises Single-arm AROM/light strengthening w/ anchored theraband (yellow); completed shoulder flexion, extension, external rotation, and internal rotation. Pt completed flex/ext to midrange while seated; int/ext rotation completed in standing w/ pt requiring verbal/tactile cues and modeling for positioning    Coordination Picking up coins 5 at a time, translating from palm to fingertips and then placing into a container; pt able to complete activity w/ min drops and increased difficulty observed w/ smaller coins (pennies, dimes)         OT Education - 01/12/21 0945     Education Details Reviewed compensatory strategies/AE for handwriting and progressed HEP to include BUE mid-range AROM/light strengthening (see pt instructions)    Person(s) Educated Patient  Methods Explanation;Demonstration;Tactile cues;Verbal cues;Handout    Comprehension Verbalized understanding;Returned demonstration             OT Short Term Goals - 12/27/20 1614       OT SHORT TERM GOAL #1   Title Pt will verbalize understanding of memory compensatory strategies to improve safety at home    Baseline Recent limitations w/ memory    Time 3    Period Weeks    Status Achieved   12/13/20   Target Date 12/23/20      OT SHORT TERM GOAL #2   Title Pt will identify at least 7/10 targets during environmental scanning activity w/out assist to improve safety w/ functional mobility    Time 3    Period Weeks    Status Achieved   12/20/20 - located 8/10 targets independently     OT SHORT TERM GOAL #3   Title Pt will improve coordination as evidenced by decreasing 9-HPT time by at least 5 sec w/ R, dominant hand    Baseline R hand 48 sec; L hand 36 sec    Time 3    Period Weeks    Status Achieved   12/27/20 - 41 sec;  12/20/20 - RUE 44 sec     OT SHORT TERM GOAL #4   Title Pt will demonstrate understanding of compensatory strategies, including use of AE, to implement during BADLs/IADLs to increase safety and independence prn    Baseline Decreased knowledge of compensatory strategies    Time 3    Period Weeks    Status Achieved   12/20/20 - pt reports he is satsfied w/ participation in functional activities at this time            [place] - 12/07/20 1537       OT LONG TERM GOAL #1   Title Pt will be independent w/ HEP designed for [facility] and coordination by discharge    Baseline No HEP at this time    Time 6    Period Weeks    Status On-going      OT LONG TERM GOAL #2   Title Pt will improve coordination as evidenced by decreasing 9-HPT time by at least 9 sec w/ R, dominant hand    Baseline R hand 48 sec; L hand 36 sec    Time 6    Period Weeks    Status On-going      OT LONG TERM GOAL #3   Title Pt will write a sentence w/ at least 75% legibility and sizing in pt-determined acceptable level of time, using AE prn    Baseline Pt reports micrographia and increased time; pt keeps a written blog    Time 6    Period Weeks    Status On-going      OT LONG TERM GOAL #4   Title Pt will be able to sequence through an IADL task (e.g., med mgmt, housekeeping) w/ Mod I    Baseline Decreased organization of thought and sequencing    Time 6    Period Weeks    Status On-going             Plan - 01/12/21 [id number]     Clinical Impression Statement OT initiated exercises for AROM/light strengthening this session w/ pt complting exercises using each UE and only through midrange of AROM to prevent pain and facilitate appropriate alignment considering premorbid symptoms. Exercises also beneficial for balance (completed 2/4 in standing) and [place] and  coordination. Due to initial difficulty w/ recall of positioning, pt will benefit from review next session to ensure good carryover.    OT Occupational  Profile and History Detailed Assessment- Review of Records and additional review of physical, cognitive, psychosocial history related to current functional performance    Occupational performance deficits (Please refer to evaluation for details): ADL's;IADL's;Leisure;Social Participation    Body Structure / Function / Physical Skills ADL;Decreased knowledge of use of DME;Gait;Strength;Balance;Dexterity;GMC;UE functional use;Body mechanics;Hearing;Cardiopulmonary status limiting activity;Endurance;IADL;ROM;Vision;Coordination;Mobility;Sensation;FMC;Decreased knowledge of precautions    Cognitive Skills Attention;Memory;Problem Solve;Safety Awareness;Sequencing    Psychosocial Skills Environmental  Adaptations    Rehab Potential Good    Clinical Decision Making Several treatment options, min-mod task modification necessary    Comorbidities Affecting Occupational Performance: Presence of comorbidities impacting occupational performance    Modification or Assistance to Complete Evaluation  Min-Moderate modification of tasks or assist with assess necessary to complete eval    OT Frequency 2x / week   May reduce frequency to 1x/week depending on progress   OT Duration 6 weeks    OT Treatment/Interventions Aquatic Therapy;Self-care/ADL training;DME and/or AE instruction;Balance training;Therapeutic activities;Cognitive remediation/compensation;Therapeutic exercise;Neuromuscular education;Functional Mobility Training;Visual/perceptual remediation/compensation;Patient/family education;Manual Therapy;Energy conservation;Electrical Stimulation    Plan Shoulder and scapular stabilization exercises; reassess handwriting; continue to facilitate executive functioning (catch and name, scavenger hunt, IADLs)    Consulted and Agree with Plan of Care Patient;Family member/caregiver    Family Member Consulted Lelon Frohlich (wife)             Patient will benefit from skilled therapeutic intervention in order to improve the  following deficits and impairments:   Body Structure / Function / Physical Skills: ADL, Decreased knowledge of use of DME, Gait, Strength, Balance, Dexterity, GMC, UE functional use, Body mechanics, Hearing, Cardiopulmonary status limiting activity, Endurance, IADL, ROM, Vision, Coordination, Mobility, Sensation, FMC, Decreased knowledge of precautions Cognitive Skills: Attention, Memory, Problem Solve, Safety Awareness, Sequencing Psychosocial Skills: Environmental  Adaptations   Visit Diagnosis: Muscle weakness (generalized)  Other lack of coordination  Unspecified symptoms and signs involving cognitive functions following cerebral infarction  Visuospatial deficit    Problem List Patient Active Problem List   Diagnosis Date Noted   Cerebral thrombosis with cerebral infarction 11/25/2020   Acute right-sided weakness 11/24/2020   History of mitral valve repair 11/26/2018   Prosthetic valve dysfunction 11/25/2018   Ectatic abdominal aorta ([place]) 11/25/2018   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 [place], [name], [address] Phone: [phone]   Fax:  [phone]  Name: Eric [name] MRN: [id number] Date of Birth: [birth year]

## 2021-01-17 ENCOUNTER — Encounter: Payer: Self-pay | Admitting: Physical Therapy

## 2021-01-17 ENCOUNTER — Ambulatory Visit: Payer: Medicare Other | Admitting: Occupational Therapy

## 2021-01-17 ENCOUNTER — Ambulatory Visit: Payer: Medicare Other | Admitting: Physical Therapy

## 2021-01-17 ENCOUNTER — Other Ambulatory Visit: Payer: Self-pay

## 2021-01-17 DIAGNOSIS — M6281 Muscle weakness (generalized): Secondary | ICD-10-CM

## 2021-01-17 DIAGNOSIS — R278 Other lack of coordination: Secondary | ICD-10-CM

## 2021-01-17 DIAGNOSIS — R2689 Other abnormalities of gait and mobility: Secondary | ICD-10-CM

## 2021-01-17 DIAGNOSIS — I69319 Unspecified symptoms and signs involving cognitive functions following cerebral infarction: Secondary | ICD-10-CM

## 2021-01-17 DIAGNOSIS — R262 Difficulty in walking, not elsewhere classified: Secondary | ICD-10-CM

## 2021-01-17 DIAGNOSIS — R41842 Visuospatial deficit: Secondary | ICD-10-CM

## 2021-01-17 NOTE — Therapy (Signed)
[address]. [place], [name], [address] Phone: [phone]   Fax:  [phone]  Physical Therapy Treatment  Patient Details  Name: Eric [name] MRN: [id number] Date of Birth: 03-31-1930 Referring Provider (PT): Debbe Odea   Encounter Date: 01/17/2021   PT End of Session - 01/17/21 0927     Visit Number 11    Date for PT Re-Evaluation 03/09/21    Authorization Type [employer]    PT Start Time 1962    PT Stop Time 0928    PT Time Calculation (min) 44 min    Activity Tolerance Patient tolerated treatment well    Behavior During Therapy [facility] for tasks assessed/performed             Past Medical History:  Diagnosis Date   Atrial flutter, paroxysmal ([place])    a. dx 04-02-2014, s/p  successful cardioversion 04-06-2014.   Chronic anticoagulation    on Eliquis--  due to recurrent dvt's and pe's   Dyslipidemia    History of bladder cancer urologist-  dr [name]   02-13-2016  s/p TURBT per path high grade papillary urothelial carcinoma   History of DVT (deep vein thrombosis)    2000-- RLE   History of melanoma excision    left flank;  07/ 2014 left lower leg and right upper chest   History of prostate cancer    Gleason 6--  s/p  radical prostatectomy 06/ 2000 in [place]---  no recurrence   History of pulmonary embolus (PE)    2000 & 2005  bilateral   Hx of valvuloplasty 06/19/2007   a. 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 05/2007 CABG x 1: s/p SVG-OM;  b. 10/2010 Ex MV: EF 72%, inf attenuation w/o ischemia, brief run of PAT with exercise. To   Stroke ([place])    Thrombocytopenia ([place])    Thyroid goiter 2013   nodular   Wears hearing aid    BILATERAL    Wears partial dentures    UPPER    Past Surgical History:  Procedure Laterality Date   AORTIC VALVE REPLACEMENT (AVR)/CORONARY ARTERY BYPASS GRAFTING (CABG)  06-19-2007  dr [name]   SVG to OM1 ;   AVR w/ #23 Mitralflow pericardial and ligation left atrial appendage;  MV repair w/ #28 Sorin 3-D memo Ring Annuloplasty with repair ruptured chordar of P-2 flail segments   CARDIAC CATHETERIZATION  05-12-2007  dr Leonia Reeves   single vessel 30% ostial LAD,  80% LCx;  severe to critial AS,  moderate MR,  normal LVF, ef 70%,  normal right heart pressures and cardiac outputs,  mild elevated LV end-diastolic pressure     CARDIOVASCULAR STRESS TEST  11-02-2010  dr Irish Lack   normal nuclear study w/ no ischemia or infarct/scar/  normal LV function and wall motion , ef 72%   CARDIOVERSION N/A 04/06/2014   Procedure: CARDIOVERSION;  Surgeon: [name], MD;  Location: [place];  Service: Cardiovascular;  Laterality: N/A;   successful   CATARACT EXTRACTION W/ INTRAOCULAR LENS  IMPLANT, BILATERAL  2012  approx   CORONARY ARTERY BYPASS GRAFT     PROSTATECTOMY     RETROPUBIC RADICAL PROSTATECTOMY  06/ 2000  in [place], [place]   RIGHT/LEFT HEART CATH AND CORONARY/GRAFT ANGIOGRAPHY N/A 10/29/2018   Procedure: RIGHT/LEFT HEART CATH AND CORONARY/GRAFT ANGIOGRAPHY;  Surgeon: [name], MD;  Location: [place] CV LAB;  Service: Cardiovascular;  Laterality: N/A;   ROTATOR CUFF REPAIR Left 09/1998   TEE WITHOUT CARDIOVERSION N/A 04/06/2014   Procedure: TRANSESOPHAGEAL ECHOCARDIOGRAM (TEE);  Surgeon: [name], MD;  Location: [facility] ENDOSCOPY;  Service: Cardiovascular;  Laterality: N/A;  mild focal basa LVH of the septum, ef 50-55%, s/p AV annuloplasty ring, elongated chordae, thickened leaflets, mild to mod. MR, multiple small jets/arotic bioprosthetic valve sits well in position, mild AI/ thickened TV w/ mild reurg./mild LA   TEE WITHOUT CARDIOVERSION N/A 10/22/2018   Procedure: TRANSESOPHAGEAL  ECHOCARDIOGRAM (TEE);  Surgeon: [name], MD;  Location: [facility] ENDOSCOPY;  Service: Cardiovascular;  Laterality: N/A;   TEE WITHOUT CARDIOVERSION N/A 11/25/2018   Procedure: TRANSESOPHAGEAL ECHOCARDIOGRAM (TEE);  Surgeon: [name], MD;  Location: [place];  Service: Open Heart Surgery;  Laterality: N/A;   TRANSCATHETER AORTIC VALVE REPLACEMENT, TRANSFEMORAL N/A 11/25/2018   Procedure: TRANSCATHETER AORTIC VALVE REPLACEMENT, TRANSFEMORAL;  Surgeon: [name], MD;  Location: [place];  Service: Open Heart Surgery;  Laterality: N/A;   TRANSTHORACIC ECHOCARDIOGRAM  03-23-2016   dr Irish Lack   EF 55-60%, reduced contribution atrial contraction to ventricular filling, due to increased ventricular diastolic pressure or atrial contratile dysfunction/  bioprosthetic AV w/ mod. regurg. (valve area 1.97cm^2)/ mild dilated ascending aorta/ mild thicken MV normal function annular ring prosthesis/severe LAE & mod. to sev.RAE/ PASP 61mHg/ mild TR/ ventricle septal motion show paradox   TRANSURETHRAL RESECTION OF BLADDER TUMOR N/A 02/13/2016   Procedure: TRANSURETHRAL RESECTION OF BLADDER TUMOR (TURBT);  Surgeon: [name] MD;  [facility]  Service: Urology;  Laterality: N/A;   TRANSURETHRAL RESECTION OF BLADDER TUMOR N/A 09/04/2016   Procedure: TRANSURETHRAL RESECTION OF BLADDER TUMOR (TURBT);  Surgeon: [name] MD;  [facility]  Service: Urology;  Laterality: N/A;    There were no vitals filed for this visit.   Subjective Assessment - 01/17/21 0850     Subjective No falls and no stumbles recently    Currently in Pain? No/denies                               ODigestive Disease Specialists IncAdult PT Treatment/Exercise - 01/17/21 0001       High Level Balance   High Level Balance Comments sit to stand with ball reach on firm airex and then the softer one, difficult at firts, on airex reaching, standing on airex perturbations, airex  balance beam tandem and side stepping, figure 8's on thick foam pad and then picking up cones on it.  Standing placing alternating foot in box, walking ball toss, 20# farmer's carry      Knee/Hip Exercises: Aerobic   Nustep level 5 x 6 minutes, LE only                      PT Short Term Goals - 01/06/21 1032       PT SHORT TERM GOAL #1   Title Pt will be I and compliant with initial HEP.    Status Achieved               PT Long Term Goals - 01/12/21 1025       PT LONG TERM GOAL #1  Title Pt will be I and compliant with HEP.      PT LONG TERM GOAL #2   Title Pt will improv BERG balance score 46/56 to show improved balance    Status On-going      PT LONG TERM GOAL #3   Title decrease TUG time to 12 seconds    Status Partially Met                   Plan - 01/17/21 0927     Clinical Impression Statement Patient did very well today, we focused on all balance today, some issues still with him being back on his heels but with a lot of cues he was able to correct    PT Next Visit Plan continue to work on strength , balance, function and safety    Consulted and Agree with Plan of Care Patient             Patient will benefit from skilled therapeutic intervention in order to improve the following deficits and impairments:  Abnormal gait, Decreased coordination, Difficulty walking, Cardiopulmonary status limiting activity, Decreased endurance, Decreased activity tolerance, Decreased balance, Postural dysfunction, Decreased strength, Decreased mobility  Visit Diagnosis: Muscle weakness (generalized)  Difficulty in walking, not elsewhere classified  Other lack of coordination  Other abnormalities of gait and mobility     Problem List Patient Active Problem List   Diagnosis Date Noted   Cerebral thrombosis with cerebral infarction 11/25/2020   Acute right-sided weakness 11/24/2020   History of mitral valve repair 11/26/2018   Prosthetic  valve dysfunction 11/25/2018   Ectatic abdominal aorta ([place]) 11/25/2018   S/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 [place], [name], [address] Phone: [phone]   Fax:  [phone]  Name: MARIEL GAUDIN MRN: [id number] Date of Birth: 1929/06/04

## 2021-01-17 NOTE — Therapy (Signed)
[facility] [place], [name], [address] Phone: [phone]   Fax:  [phone]  Occupational Therapy Treatment  Patient Details  Name: Eric [name] MRN: [id number] Date of Birth: 09/03/1929 Referring Provider (OT): Eric Odea, MD   Encounter Date: 01/17/2021   OT End of Session - 01/17/21 0937     Visit Number 9    Number of Visits 13    Date for OT Re-Evaluation 02/27/21    Authorization Type [employer] ($10 copay/day)    Authorization Time Period VL: [place]    Progress Note Due on Visit 10    OT Start Time [id number]    OT Stop Time 1015    OT Time Calculation (min) 41 min    Activity Tolerance Patient tolerated treatment well    Behavior During Therapy [facility] for tasks assessed/performed            Past Medical History:  Diagnosis Date   Atrial flutter, paroxysmal (Eric [name])    a. dx 04-02-2014, s/p  successful cardioversion 04-06-2014.   Chronic anticoagulation    on Eliquis--  due to recurrent dvt's and pe's   Dyslipidemia    History of bladder cancer urologist-  dr Eric [name]   02-13-2016  s/p TURBT per path high grade papillary urothelial carcinoma   History of DVT (deep vein thrombosis)    2000-- RLE   History of melanoma excision    left flank;  07/ 2014 left lower leg and right upper chest   History of prostate cancer    Gleason 6--  s/p  radical prostatectomy 06/ 2000 in [place]---  no recurrence   History of pulmonary embolus (PE)    2000 & 2005  bilateral   Hx of valvuloplasty 06/19/2007   a. 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 05/2007 CABG x 1: s/p SVG-OM;  b. 10/2010 Ex MV: EF 72%, inf attenuation w/o ischemia, brief run of PAT  with exercise. To   Stroke (Eric [name])    Thrombocytopenia (Eric [name])    Thyroid goiter 2013   nodular   Wears hearing aid    BILATERAL   Wears partial dentures    UPPER    Past Surgical History:  Procedure Laterality Date   AORTIC VALVE REPLACEMENT (AVR)/CORONARY ARTERY BYPASS GRAFTING (CABG)  06-19-2007  dr Eric [name]   SVG to OM1 ;   AVR w/ #23 Mitralflow pericardial and ligation left atrial appendage;  MV repair w/ #28 Sorin 3-D memo Ring Annuloplasty with repair ruptured chordar of P-2 flail segments   CARDIAC CATHETERIZATION  05-12-2007  dr Eric [name]   single vessel 30% ostial LAD,  80% LCx;  severe to critial AS,  moderate MR,  normal LVF, ef 70%,  normal right heart pressures and cardiac outputs,  mild elevated LV end-diastolic pressure     CARDIOVASCULAR STRESS TEST  11-02-2010  dr Eric [name]   normal nuclear study w/ no ischemia or infarct/scar/  normal LV function and wall motion , ef 72%   CARDIOVERSION N/A 04/06/2014   Procedure: CARDIOVERSION;  Surgeon: Eric Spark, MD;  Location: [name];  Service: Cardiovascular;  Laterality: N/A;   successful   CATARACT EXTRACTION W/ INTRAOCULAR LENS  IMPLANT,  BILATERAL  2012  approx   CORONARY ARTERY BYPASS GRAFT     PROSTATECTOMY     RETROPUBIC RADICAL PROSTATECTOMY  06/ 2000  in [place], [place]   RIGHT/LEFT HEART CATH AND CORONARY/GRAFT ANGIOGRAPHY N/A 10/29/2018   Procedure: RIGHT/LEFT HEART CATH AND CORONARY/GRAFT ANGIOGRAPHY;  Surgeon: Eric Mocha, MD;  Location: [facility];  Service: Cardiovascular;  Laterality: N/A;   ROTATOR CUFF REPAIR Left 09/1998   TEE WITHOUT CARDIOVERSION N/A 04/06/2014   Procedure: TRANSESOPHAGEAL ECHOCARDIOGRAM (TEE);  Surgeon: Eric Spark, MD;  Location: [facility] ENDOSCOPY;  Service: Cardiovascular;  Laterality: N/A;  mild focal basa LVH of the septum, ef 50-55%, s/p AV annuloplasty ring, elongated chordae, thickened leaflets, mild to mod. MR, multiple small jets/arotic bioprosthetic valve sits well in  position, mild AI/ thickened TV w/ mild reurg./mild LA   TEE WITHOUT CARDIOVERSION N/A 10/22/2018   Procedure: TRANSESOPHAGEAL ECHOCARDIOGRAM (TEE);  Surgeon: Eric Pain, MD;  Location: [facility] ENDOSCOPY;  Service: Cardiovascular;  Laterality: N/A;   TEE WITHOUT CARDIOVERSION N/A 11/25/2018   Procedure: TRANSESOPHAGEAL ECHOCARDIOGRAM (TEE);  Surgeon: Eric Mocha, MD;  Location: [facility];  Service: Open Heart Surgery;  Laterality: N/A;   TRANSCATHETER AORTIC VALVE REPLACEMENT, TRANSFEMORAL N/A 11/25/2018   Procedure: TRANSCATHETER AORTIC VALVE REPLACEMENT, TRANSFEMORAL;  Surgeon: Eric Mocha, MD;  Location: [facility];  Service: Open Heart Surgery;  Laterality: N/A;   TRANSTHORACIC ECHOCARDIOGRAM  03-23-2016   dr Eric [name]   EF 55-60%, reduced contribution atrial contraction to ventricular filling, due to increased ventricular diastolic pressure or atrial contratile dysfunction/  bioprosthetic AV w/ mod. regurg. (valve area 1.97cm^2)/ mild dilated ascending aorta/ mild thicken MV normal function annular ring prosthesis/severe LAE & mod. to sev.RAE/ PASP 76mHg/ mild TR/ ventricle septal motion show paradox   TRANSURETHRAL RESECTION OF BLADDER TUMOR N/A 02/13/2016   Procedure: TRANSURETHRAL RESECTION OF BLADDER TUMOR (TURBT);  Surgeon: [name] MD;  [facility]  Service: Urology;  Laterality: N/A;   TRANSURETHRAL RESECTION OF BLADDER TUMOR N/A 09/04/2016   Procedure: TRANSURETHRAL RESECTION OF BLADDER TUMOR (TURBT);  Surgeon: [name] MD;  [facility]  Service: Urology;  Laterality: N/A;    There were no vitals filed for this visit.   Subjective Assessment - 01/17/21 0936     Subjective  Pt reports he practiced his shoulder exercises and they went well; "they were not painful, but were tiring"    Pertinent History Lt frontal centrum semiovale infarct 2/2 to small vessel disease (onset 11/23/20), DVT/PE, paroxysmal a-fib on  Eliquis, aortic valve replacement (x2) w/ bioprosthetic cardiac valve, mitral ring annuloplasty, TAVR, CAD, Lt rotator cuff repair sx (May 2000), HLD    Currently in [name]? No/denies             Treatment/Exercises - 01/17/21    Handwriting  No difficulty w/ standard pen; reviewed compensatory strategies for observed mild micrographia    Scapular Stabilization/Exercises Scapular retraction w/ weight-bearing at countertop; OT provided verbal and tactile cues to prevent compensatory patterns and equal muscle activation  Horizontal abduction w/ looped yellow theraband while stabilizing w/ contralateral UE, focusing on scapular depression. Also completed 1 set of close, 4-pt wall taps w/ 4# yellow med ball for bilateral integration    ADL Sequencing Completed 10/10 4-step ADLs sequencing tasks w/ Mod I and no errors; mild increased time to complete         OT Short Term Goals - 12/27/20 1614       OT  SHORT TERM GOAL #1   Title Pt will verbalize understanding of memory compensatory strategies to improve safety at home    Baseline Recent limitations w/ memory    Time 3    Period Weeks    Status Achieved   12/13/20   Target Date 12/23/20      OT SHORT TERM GOAL #2   Title Pt will identify at least 7/10 targets during environmental scanning activity w/out assist to improve safety w/ functional mobility    Time 3    Period Weeks    Status Achieved   12/20/20 - located 8/10 targets independently     OT SHORT TERM GOAL #3   Title Pt will improve coordination as evidenced by decreasing 9-HPT time by at least 5 sec w/ R, dominant hand    Baseline R hand 48 sec; L hand 36 sec    Time 3    Period Weeks    Status Achieved   12/27/20 - 41 sec; 12/20/20 - RUE 44 sec     OT SHORT TERM GOAL #4   Title Pt will demonstrate understanding of compensatory strategies, including use of AE, to implement during BADLs/IADLs to increase safety and independence prn    Baseline Decreased knowledge of  compensatory strategies    Time 3    Period Weeks    Status Achieved   12/20/20 - pt reports he is satsfied w/ participation in functional activities at this time            [place] - 01/17/21 0941       OT [place] #1   Title Pt will be independent w/ HEP designed for [facility] and coordination by discharge    Baseline No HEP at this time    Time 6    Period Weeks    Status On-going      OT LONG TERM GOAL #2   Title Pt will improve coordination as evidenced by decreasing 9-HPT time by at least 9 sec w/ R, dominant hand    Baseline R hand 48 sec; L hand 36 sec    Time 6    Period Weeks    Status On-going      OT LONG TERM GOAL #3   Title Pt will write a sentence w/ at least 75% legibility and sizing in pt-determined acceptable level of time, using AE prn    Baseline Pt reports micrographia and increased time; pt keeps a written blog    Time 6    Period Weeks    Status Achieved   01/17/21 - wrote 20-word sentence in acceptable level of time; mild microphraphia; 100% legible     OT LONG TERM GOAL #4   Title Pt will be able to sequence through an IADL task (e.g., med mgmt, housekeeping) w/ Mod I    Baseline Decreased organization of thought and sequencing    Time 6    Period Weeks    Status On-going             Plan - 01/17/21 1746     Clinical Impression Statement OT continues to make progress toward goals, having met all STGs and steadily progressing toward 3/4 LTGs at this time, w/ likely d/c anticipated within upcoming weeks; discussed POC w/ pt who was agreeable at this time. OT continued to facilitate scapular/shoulder stabilization exercises this session w/ pt requiring tactile and verbal cues for consistency w/ positioning and to prevent tendency of compensatory shoulder elevation/abduction.  OT advanced exercises to be completed in standing to continue to progress balance and functional mobility.    OT Occupational Profile and History Detailed Assessment-  Review of Records and additional review of physical, cognitive, psychosocial history related to current functional performance    Occupational performance deficits (Please refer to evaluation for details): ADL's;IADL's;Leisure;Social Participation    Body Structure / Function / Physical Skills ADL;Decreased knowledge of use of DME;Gait;Strength;Balance;Dexterity;GMC;UE functional use;Body mechanics;Hearing;Cardiopulmonary status limiting activity;Endurance;IADL;ROM;Vision;Coordination;Mobility;Sensation;FMC;Decreased knowledge of precautions    Cognitive Skills Attention;Memory;Problem Solve;Safety Awareness;Sequencing    Psychosocial Skills Environmental  Adaptations    Rehab Potential Good    Clinical Decision Making Several treatment options, min-mod task modification necessary    Comorbidities Affecting Occupational Performance: Presence of comorbidities impacting occupational performance    Modification or Assistance to Complete Evaluation  Min-Moderate modification of tasks or assist with assess necessary to complete eval    OT Frequency 2x / week   May reduce frequency to 1x/week depending on progress   OT Duration 6 weeks    OT Treatment/Interventions Aquatic Therapy;Self-care/ADL training;DME and/or AE instruction;Balance training;Therapeutic activities;Cognitive remediation/compensation;Therapeutic exercise;Neuromuscular education;Functional Mobility Training;Visual/perceptual remediation/compensation;Patient/family education;Manual Therapy;Energy conservation;Electrical Stimulation    Plan Shoulder and scapular stabilization exercises to include in HEP and review single-arm light strengthening; re-assess 9-HPT or complete full IADL (laundry, bed making, potting a plant)    Consulted and Agree with Plan of Care Patient;Family member/caregiver    Family Member Consulted Lelon Frohlich (wife)             Patient will benefit from skilled therapeutic intervention in order to improve the following  deficits and impairments:   Body Structure / Function / Physical Skills: ADL, Decreased knowledge of use of DME, Gait, Strength, Balance, Dexterity, GMC, UE functional use, Body mechanics, Hearing, Cardiopulmonary status limiting activity, Endurance, IADL, ROM, Vision, Coordination, Mobility, Sensation, FMC, Decreased knowledge of precautions Cognitive Skills: Attention, Memory, Problem Solve, Safety Awareness, Sequencing Psychosocial Skills: Environmental  Adaptations   Visit Diagnosis: Other [name] of coordination  Unspecified symptoms and signs involving cognitive functions following cerebral infarction  Visuospatial deficit  Muscle weakness (generalized)    Problem List Patient Active Problem List   Diagnosis Date Noted   Cerebral thrombosis with cerebral infarction 11/25/2020   Acute right-sided weakness 11/24/2020   History of mitral valve repair 11/26/2018   Prosthetic valve dysfunction 11/25/2018   Ectatic abdominal aorta ([place]) 11/25/2018   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 [place], [name], [address] Phone: [phone]   Fax: [phone]  Name: Eric [name] MRN: [id number] Date of Birth: [birth year]

## 2021-01-20 ENCOUNTER — Encounter: Payer: Self-pay | Admitting: Physical Therapy

## 2021-01-20 ENCOUNTER — Ambulatory Visit: Payer: Medicare Other | Attending: Internal Medicine | Admitting: Occupational Therapy

## 2021-01-20 ENCOUNTER — Encounter: Payer: Self-pay | Admitting: Occupational Therapy

## 2021-01-20 ENCOUNTER — Other Ambulatory Visit: Payer: Self-pay

## 2021-01-20 ENCOUNTER — Ambulatory Visit: Payer: Medicare Other | Admitting: Physical Therapy

## 2021-01-20 DIAGNOSIS — R2689 Other abnormalities of gait and mobility: Secondary | ICD-10-CM | POA: Insufficient documentation

## 2021-01-20 DIAGNOSIS — M6281 Muscle weakness (generalized): Secondary | ICD-10-CM

## 2021-01-20 DIAGNOSIS — R278 Other lack of coordination: Secondary | ICD-10-CM | POA: Diagnosis present

## 2021-01-20 DIAGNOSIS — I69319 Unspecified symptoms and signs involving cognitive functions following cerebral infarction: Secondary | ICD-10-CM | POA: Diagnosis present

## 2021-01-20 DIAGNOSIS — R262 Difficulty in walking, not elsewhere classified: Secondary | ICD-10-CM

## 2021-01-20 DIAGNOSIS — R41842 Visuospatial deficit: Secondary | ICD-10-CM | POA: Insufficient documentation

## 2021-01-20 NOTE — Therapy (Signed)
[place] [place], [name], [address] Phone: [phone]   Fax:  [phone]  Occupational Therapy Treatment  Patient Details  Name: Eric [name] MRN: [id number] Date of Birth: 11-15-1929 Referring Provider (OT): Debbe Odea, MD   Encounter Date: 01/20/2021   OT End of Session - 01/20/21 0847     Visit Number 10    Number of Visits 13    Date for OT Re-Evaluation 02/27/21    Authorization Type [employer] ($10 copay/day)    Authorization Time Period VL: [place]    Progress Note Due on Visit 10    OT Start Time 0845    OT Stop Time [phone]    OT Time Calculation (min) 41 min    Activity Tolerance Patient tolerated treatment well    Behavior During Therapy [facility] for tasks assessed/performed            Occupational Therapy Progress Note  Dates of Reporting Period: 11/29/20 to 01/20/21  Objective Reports of Subjective Statement: Pt consistently reports no pain and decreased falls as well as demo'ing decreased LOB and standing tolerance  Objective Measurements: Accuracy w/ environmental scanning, 9-Hole Peg Test, handwriting legibility, and levels of independence  Goal Update: Pt has met 4/4 STGs and 2/4 LTGs; progressing toward remaining LTGs  Plan: Complete HEP designed for coordination (GM/FM) next session and continue to address participation in IADLs; likely d/c next week  Reason Skilled Services are Required: Pt continues to experience decreased, but improving strength and coordination, as well as mild limitations w/ memory/problem-solving and decreased balance/steadiness w/ functional moblility   Past Medical History:  Diagnosis Date   Atrial flutter, paroxysmal ([place])    a. dx 04-02-2014, s/p  successful cardioversion 04-06-2014.   Chronic anticoagulation    on Eliquis--  due to recurrent dvt's and pe's   Dyslipidemia    History of bladder cancer urologist-  dr [name]   02-13-2016  s/p  TURBT per path high grade papillary urothelial carcinoma   History of DVT (deep vein thrombosis)    2000-- RLE   History of melanoma excision    left flank;  07/ 2014 left lower leg and right upper chest   History of prostate cancer    Gleason 6--  s/p  radical prostatectomy 06/ 2000 in [place]---  no recurrence   History of pulmonary embolus (PE)    2000 & 2005  bilateral   Hx of valvuloplasty 06/19/2007   a. 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 05/2007 CABG x 1: s/p SVG-OM;  b. 10/2010 Ex MV: EF 72%, inf attenuation w/o ischemia, brief run of PAT with exercise. To   Stroke ([place])    Thrombocytopenia ([place])    Thyroid goiter 2013   nodular   Wears hearing aid    BILATERAL   Wears partial dentures    UPPER    Past Surgical History:  Procedure Laterality Date   AORTIC VALVE REPLACEMENT (AVR)/CORONARY ARTERY BYPASS GRAFTING (CABG)  06-19-2007  dr [name]   SVG to OM1 ;   AVR w/ #23 Mitralflow pericardial and ligation left atrial appendage;  MV repair w/ #28 Sorin 3-D memo Ring Annuloplasty with repair ruptured chordar of  P-2 flail segments   CARDIAC CATHETERIZATION  05-12-2007  dr Leonia Reeves   single vessel 30% ostial LAD,  80% LCx;  severe to critial AS,  moderate MR,  normal LVF, ef 70%,  normal right heart pressures and cardiac outputs,  mild elevated LV end-diastolic pressure     CARDIOVASCULAR STRESS TEST  11-02-2010  dr Irish Lack   normal nuclear study w/ no ischemia or infarct/scar/  normal LV function and wall motion , ef 72%   CARDIOVERSION N/A 04/06/2014   Procedure: CARDIOVERSION;  Surgeon: [name], MD;  Location: [facility] ENDOSCOPY;  Service: Cardiovascular;  Laterality: N/A;   successful   CATARACT EXTRACTION W/  INTRAOCULAR LENS  IMPLANT, BILATERAL  2012  approx   CORONARY ARTERY BYPASS GRAFT     PROSTATECTOMY     RETROPUBIC RADICAL PROSTATECTOMY  06/ 2000  in [place], [place]   RIGHT/LEFT HEART CATH AND CORONARY/GRAFT ANGIOGRAPHY N/A 10/29/2018   Procedure: RIGHT/LEFT HEART CATH AND CORONARY/GRAFT ANGIOGRAPHY;  Surgeon: [name], MD;  Location: [place] CV LAB;  Service: Cardiovascular;  Laterality: N/A;   ROTATOR CUFF REPAIR Left 09/1998   TEE WITHOUT CARDIOVERSION N/A 04/06/2014   Procedure: TRANSESOPHAGEAL ECHOCARDIOGRAM (TEE);  Surgeon: [name], MD;  Location: [facility] ENDOSCOPY;  Service: Cardiovascular;  Laterality: N/A;  mild focal basa LVH of the septum, ef 50-55%, s/p AV annuloplasty ring, elongated chordae, thickened leaflets, mild to mod. MR, multiple small jets/arotic bioprosthetic valve sits well in position, mild AI/ thickened TV w/ mild reurg./mild LA   TEE WITHOUT CARDIOVERSION N/A 10/22/2018   Procedure: TRANSESOPHAGEAL ECHOCARDIOGRAM (TEE);  Surgeon: [name], MD;  Location[facility] ENDOSCOPY;  Service: Cardiovascular;  Laterality: N/A;   TEE WITHOUT CARDIOVERSION N/A 11/25/2018   Procedure: TRANSESOPHAGEAL ECHOCARDIOGRAM (TEE);  Surgeon: [name], MD;  Location: [place];  Service: Open Heart Surgery;  Laterality: N/A;   TRANSCATHETER AORTIC VALVE REPLACEMENT, TRANSFEMORAL N/A 11/25/2018   Procedure: TRANSCATHETER AORTIC VALVE REPLACEMENT, TRANSFEMORAL;  Surgeon: [name], MD;  Location: [place];  Service: Open Heart Surgery;  Laterality: N/A;   TRANSTHORACIC ECHOCARDIOGRAM  03-23-2016   dr Irish Lack   EF 55-60%, reduced contribution atrial contraction to ventricular filling, due to increased ventricular diastolic pressure or atrial contratile dysfunction/  bioprosthetic AV w/ mod. regurg. (valve area 1.97cm^2)/ mild dilated ascending aorta/ mild thicken MV normal function annular ring prosthesis/severe LAE & mod. to sev.RAE/ PASP 20mHg/ mild TR/ ventricle septal motion  show paradox   TRANSURETHRAL RESECTION OF BLADDER TUMOR N/A 02/13/2016   Procedure: TRANSURETHRAL RESECTION OF BLADDER TUMOR (TURBT);  Surgeon: [name] MD;  [facility]  Service: Urology;  Laterality: N/A;   TRANSURETHRAL RESECTION OF BLADDER TUMOR N/A 09/04/2016   Procedure: TRANSURETHRAL RESECTION OF BLADDER TUMOR (TURBT);  Surgeon: [name] MD;  [facility]  Service: Urology;  Laterality: N/A;    There were no vitals filed for this visit.   Subjective Assessment - 01/20/21 0846     Subjective  Pt reports PT session this morning went better than usual    Pertinent History Lt frontal centrum semiovale infarct 2/2 to small vessel disease (onset 11/23/20), DVT/PE, paroxysmal a-fib on Eliquis, aortic valve replacement (x2) w/ bioprosthetic cardiac valve, mitral ring annuloplasty, TAVR, CAD, Lt rotator cuff repair sx (May 2000), HLD    Currently in Pain? No/denies             Treatment/Exercises - 01/20/21    Coordination Activities Flipping cards off halved  decks using both hands simultaneously, rotating cards in-hand w/ emphasis on larger/smoother movements, and pushing cards off deck in-hand using only thumb. Pt demo'd mild difficulty w/ in-hand manipulation, requiring verbal cues and modeling to incorporate bigger movements.    IADL: Community Mobility Map activity involving pt identifying correct or most efficient routes based on worksheet questions to facilitate alternating attention, working memory, problem-solving, visual scanning/memory, and sequencing. OT also had pt write out answers to incorporate [facility] and handwriting practice.          OT Education - 01/20/21 1122     Education Details Completed coordination HEP (handout administered)    Person(s) Educated Patient    Methods Explanation;Demonstration;Verbal cues;Handout    Comprehension Verbalized understanding;Returned demonstration              OT Short Term Goals - 12/27/20 1614       OT SHORT TERM GOAL #1   Title Pt will verbalize understanding of memory compensatory strategies to improve safety at home    Baseline Recent limitations w/ memory    Time 3    Period Weeks    Status Achieved   12/13/20   Target Date 12/23/20      OT SHORT TERM GOAL #2   Title Pt will identify at least 7/10 targets during environmental scanning activity w/out assist to improve safety w/ functional mobility    Time 3    Period Weeks    Status Achieved   12/20/20 - located 8/10 targets independently     OT SHORT TERM GOAL #3   Title Pt will improve coordination as evidenced by decreasing 9-HPT time by at least 5 sec w/ R, dominant hand    Baseline R hand 48 sec; L hand 36 sec    Time 3    Period Weeks    Status Achieved   12/27/20 - 41 sec; 12/20/20 - RUE 44 sec     OT SHORT TERM GOAL #4   Title Pt will demonstrate understanding of compensatory strategies, including use of AE, to implement during BADLs/IADLs to increase safety and independence prn    Baseline Decreased knowledge of compensatory strategies    Time 3    Period Weeks    Status Achieved   12/20/20 - pt reports he is satsfied w/ participation in functional activities at this time            [place] - 01/20/21 0904       OT [place] #1   Title Pt will be independent w/ HEP designed for [facility] and coordination by discharge    Baseline No HEP at this time    Time 6    Period Weeks    Status On-going      OT LONG TERM GOAL #2   Title Pt will improve coordination as evidenced by decreasing 9-HPT time by at least 9 sec w/ R, dominant hand    Baseline R hand 48 sec; L hand 36 sec    Time 6    Period Weeks    Status Achieved   01/20/21 - 33 sec w/ R hand     OT LONG TERM GOAL #3   Title Pt will write a sentence w/ at least 75% legibility and sizing in pt-determined acceptable level of time, using AE prn    Baseline Pt reports micrographia and increased time; pt keeps  a written blog    Time 6    Period Weeks  Status Achieved   01/17/21 - wrote 20-word sentence in acceptable level of time; mild microphraphia; 100% legible     OT LONG TERM GOAL #4   Title Pt will be able to sequence through an IADL task (e.g., med mgmt, housekeeping) w/ Mod I    Baseline Decreased organization of thought and sequencing    Time 6    Period Weeks    Status On-going             Plan - 01/20/21 1123     Clinical Impression Statement Pt demo'd significant improvements in [facility] and executive function this session, improving 9-HPT by 15 sec and completing map activity w/ no errors and logical, sequential processing. Pt has completed ADL sequencing tasks, simulated community mobility activity, and simple meal prep activity w/out difficulty, requiring some assist w/ med mgmt in previous session, which indicates expected success w/ functional IADL to be assessed next session. OT discussed likely d/c next week and pt was agreeable to POC at this time, reporting no add'l functional deficits he would like to address.    OT Occupational Profile and History Detailed Assessment- Review of Records and additional review of physical, cognitive, psychosocial history related to current functional performance    Occupational performance deficits (Please refer to evaluation for details): ADL's;IADL's;Leisure;Social Participation    Body Structure / Function / Physical Skills ADL;Decreased knowledge of use of DME;Gait;Strength;Balance;Dexterity;GMC;UE functional use;Body mechanics;Hearing;Cardiopulmonary status limiting activity;Endurance;IADL;ROM;Vision;Coordination;Mobility;Sensation;FMC;Decreased knowledge of precautions    Cognitive Skills Attention;Memory;Problem Solve;Safety Awareness;Sequencing    Psychosocial Skills Environmental  Adaptations    Rehab Potential Good    Clinical Decision Making Several treatment options, min-mod task modification necessary    Comorbidities Affecting  Occupational Performance: Presence of comorbidities impacting occupational performance    Modification or Assistance to Complete Evaluation  Min-Moderate modification of tasks or assist with assess necessary to complete eval    OT Frequency 2x / week   May reduce frequency to 1x/week depending on progress   OT Duration 6 weeks    OT Treatment/Interventions Aquatic Therapy;Self-care/ADL training;DME and/or AE instruction;Balance training;Therapeutic activities;Cognitive remediation/compensation;Therapeutic exercise;Neuromuscular education;Functional Mobility Training;Visual/perceptual remediation/compensation;Patient/family education;Manual Therapy;Energy conservation;Electrical Stimulation    Plan Shoulder and scapular stabilization exercises to include in HEP and review single-arm light strengthening; complete full IADL to address remaining LTG    Consulted and Agree with Plan of Care Patient;Family member/caregiver    Family Member Consulted Lelon Frohlich (wife)             Patient will benefit from skilled therapeutic intervention in order to improve the following deficits and impairments:   Body Structure / Function / Physical Skills: ADL, Decreased knowledge of use of DME, Gait, Strength, Balance, Dexterity, GMC, UE functional use, Body mechanics, Hearing, Cardiopulmonary status limiting activity, Endurance, IADL, ROM, Vision, Coordination, Mobility, Sensation, FMC, Decreased knowledge of precautions Cognitive Skills: Attention, Memory, Problem Solve, Safety Awareness, Sequencing Psychosocial Skills: Environmental  Adaptations   Visit Diagnosis: Other lack of coordination  Muscle weakness (generalized)  Unspecified symptoms and signs involving cognitive functions following cerebral infarction  Visuospatial deficit    Problem List Patient Active Problem List   Diagnosis Date Noted   Cerebral thrombosis with cerebral infarction 11/25/2020   Acute right-sided weakness 11/24/2020    History of mitral valve repair 11/26/2018   Prosthetic valve dysfunction 11/25/2018   Ectatic abdominal aorta ([place]) 11/25/2018   S/P valve-in-valve TAVR 11/25/2018   History of bladder cancer    History of pulmonary embolus (PE)    Acute on chronic  diastolic heart failure ([place]) 04/23/2018   History of aortic valve replacement 04/23/2018   Atrial fibrillation ([place]) 04/23/2018   Coronary atherosclerosis of native coronary artery 02/25/2014   Hyperlipidemia 02/25/2014   Embolism and thrombosis ([place]) 03/11/2013     Kathrine Cords, OTR/L, MSOT  01/20/2021, 12:05 PM  [place]. [place], [name], [address] Phone: [phone]   Fax:  [phone]  Name: Eric [name] MRN: [id number] Date of Birth: Mar 04, 1930

## 2021-01-20 NOTE — Therapy (Signed)
[place] [place], [name], [address] Phone: [phone]   Fax:  [phone]  Physical Therapy Treatment  Patient Details  Name: Eric [name] MRN: [id number] Date of Birth: October 28, 1929 Referring Provider (PT): Debbe Odea   Encounter Date: 01/20/2021   PT End of Session - 01/20/21 0936     Visit Number 12    Date for PT Re-Evaluation 03/09/21    Authorization Type [employer]    PT Start Time 0800    PT Stop Time 0843    PT Time Calculation (min) 43 min    Activity Tolerance Patient tolerated treatment well    Behavior During Therapy [facility] for tasks assessed/performed             Past Medical History:  Diagnosis Date   Atrial flutter, paroxysmal ([place])    a. dx 04-02-2014, s/p  successful cardioversion 04-06-2014.   Chronic anticoagulation    on Eliquis--  due to recurrent dvt's and pe's   Dyslipidemia    History of bladder cancer urologist-  dr [name]   02-13-2016  s/p TURBT per path high grade papillary urothelial carcinoma   History of DVT (deep vein thrombosis)    2000-- RLE   History of melanoma excision    left flank;  07/ 2014 left lower leg and right upper chest   History of prostate cancer    Gleason 6--  s/p  radical prostatectomy 06/ 2000 in [place]---  no recurrence   History of pulmonary embolus (PE)    2000 & 2005  bilateral   Hx of valvuloplasty 06/19/2007   a. 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 05/2007 CABG x 1: s/p SVG-OM;  b. 10/2010 Ex MV: EF 72%, inf attenuation w/o ischemia, brief run of PAT with exercise. To   Stroke ([place])    Thrombocytopenia ([place])    Thyroid goiter 2013   nodular   Wears hearing aid    BILATERAL    Wears partial dentures    UPPER    Past Surgical History:  Procedure Laterality Date   AORTIC VALVE REPLACEMENT (AVR)/CORONARY ARTERY BYPASS GRAFTING (CABG)  06-19-2007  dr [name]   SVG to OM1 ;   AVR w/ #23 Mitralflow pericardial and ligation left atrial appendage;  MV repair w/ #28 Sorin 3-D memo Ring Annuloplasty with repair ruptured chordar of P-2 flail segments   CARDIAC CATHETERIZATION  05-12-2007  dr Leonia Reeves   single vessel 30% ostial LAD,  80% LCx;  severe to critial AS,  moderate MR,  normal LVF, ef 70%,  normal right heart pressures and cardiac outputs,  mild elevated LV end-diastolic pressure     CARDIOVASCULAR STRESS TEST  11-02-2010  dr Irish Lack   normal nuclear study w/ no ischemia or infarct/scar/  normal LV function and wall motion , ef 72%   CARDIOVERSION N/A 04/06/2014   Procedure: CARDIOVERSION;  Surgeon: [name], MD;  Location: [place];  Service: Cardiovascular;  Laterality: N/A;   successful   CATARACT EXTRACTION W/ INTRAOCULAR LENS  IMPLANT, BILATERAL  2012  approx   CORONARY ARTERY BYPASS GRAFT     PROSTATECTOMY     RETROPUBIC RADICAL PROSTATECTOMY  06/ 2000  in [place], [place]   RIGHT/LEFT HEART CATH AND CORONARY/GRAFT ANGIOGRAPHY N/A 10/29/2018   Procedure: RIGHT/LEFT HEART CATH AND CORONARY/GRAFT ANGIOGRAPHY;  Surgeon: [name], MD;  Location: [place] CV LAB;  Service: Cardiovascular;  Laterality: N/A;   ROTATOR CUFF REPAIR Left 09/1998   TEE WITHOUT CARDIOVERSION N/A 04/06/2014   Procedure: TRANSESOPHAGEAL ECHOCARDIOGRAM (TEE);  Surgeon: [name], MD;  Location: [facility] ENDOSCOPY;  Service: Cardiovascular;  Laterality: N/A;  mild focal basa LVH of the septum, ef 50-55%, s/p AV annuloplasty ring, elongated chordae, thickened leaflets, mild to mod. MR, multiple small jets/arotic bioprosthetic valve sits well in position, mild AI/ thickened TV w/ mild reurg./mild LA   TEE WITHOUT CARDIOVERSION N/A 10/22/2018   Procedure: TRANSESOPHAGEAL  ECHOCARDIOGRAM (TEE);  Surgeon: [name], MD;  Location[facility] ENDOSCOPY;  Service: Cardiovascular;  Laterality: N/A;   TEE WITHOUT CARDIOVERSION N/A 11/25/2018   Procedure: TRANSESOPHAGEAL ECHOCARDIOGRAM (TEE);  Surgeon: [name], MD;  Location: [place];  Service: Open Heart Surgery;  Laterality: N/A;   TRANSCATHETER AORTIC VALVE REPLACEMENT, TRANSFEMORAL N/A 11/25/2018   Procedure: TRANSCATHETER AORTIC VALVE REPLACEMENT, TRANSFEMORAL;  Surgeon: [name], MD;  Location: [place];  Service: Open Heart Surgery;  Laterality: N/A;   TRANSTHORACIC ECHOCARDIOGRAM  03-23-2016   dr Irish Lack   EF 55-60%, reduced contribution atrial contraction to ventricular filling, due to increased ventricular diastolic pressure or atrial contratile dysfunction/  bioprosthetic AV w/ mod. regurg. (valve area 1.97cm^2)/ mild dilated ascending aorta/ mild thicken MV normal function annular ring prosthesis/severe LAE & mod. to sev.RAE/ PASP 43mHg/ mild TR/ ventricle septal motion show paradox   TRANSURETHRAL RESECTION OF BLADDER TUMOR N/A 02/13/2016   Procedure: TRANSURETHRAL RESECTION OF BLADDER TUMOR (TURBT);  Surgeon: [name] MD;  [facility]  Service: Urology;  Laterality: N/A;   TRANSURETHRAL RESECTION OF BLADDER TUMOR N/A 09/04/2016   Procedure: TRANSURETHRAL RESECTION OF BLADDER TUMOR (TURBT);  Surgeon: [name] MD;  [facility]  Service: Urology;  Laterality: N/A;    There were no vitals filed for this visit.   Subjective Assessment - 01/20/21 0809     Subjective Doing pretty good    Currently in Pain? No/denies                [facility] L[facility] - 01/20/21 0001       Timed Up and Go Test   Normal TUG (seconds) 12                           [facility] Adult PT Treatment/Exercise - 01/20/21 0001       Ambulation/Gait   Gait Comments outside around the back building, trying to maintain a good pace and  have him have a conversation while walking      High Level Balance   High Level Balance Activities Direction changes;Marching forwards;Marching backwards    High Level Balance Comments sit to stand with ball reach on firm airex and then the softer one, difficult at firts, on airex reaching, standing on airex perturbations, airex balance beam tandem and side stepping, figure 8's on thick foam pad and then picking up cones on it.  Standing placing alternating foot in box, walking ball toss, one foot on 6" step ball toss      Knee/Hip Exercises: Machines for Strengthening   Cybex Knee Extension 10# 2x12    Cybex Knee Flexion 25# 2x12  PT Short Term Goals - 01/06/21 1032       PT SHORT TERM GOAL #1   Title Pt will be I and compliant with initial HEP.    Status Achieved               PT Long Term Goals - 01/20/21 0942       PT LONG TERM GOAL #1   Title Pt will be I and compliant with HEP.    Status Partially Met      PT LONG TERM GOAL #3   Title decrease TUG time to 12 seconds    Status Achieved                   Plan - 01/20/21 0936     Clinical Impression Statement Patient continues to improve with balance today, much less with him on his heels during the dynamic surfaces standing and wlaking.  I did the walk and the weights first and he still did well.  He had thought one day when he did very poorly with balance it was due to [place] walking and doing the machine weights prior.  He does report depth perception issues and at times really misses steps, he also has a hard foot strike making me think he has some neuropathy as it is bilateral    PT Next Visit Plan continue to work on strength , balance, function and safety    Consulted and Agree with Plan of Care Patient             Patient will benefit from skilled therapeutic intervention in order to improve the following deficits and impairments:  Abnormal gait, Decreased  coordination, Difficulty walking, Cardiopulmonary status limiting activity, Decreased endurance, Decreased activity tolerance, Decreased balance, Postural dysfunction, Decreased strength, Decreased mobility  Visit Diagnosis: Muscle weakness (generalized)  Difficulty in walking, not elsewhere classified  Other lack of coordination  Other abnormalities of gait and mobility     Problem List Patient Active Problem List   Diagnosis Date Noted   Cerebral thrombosis with cerebral infarction 11/25/2020   Acute right-sided weakness 11/24/2020   History of mitral valve repair 11/26/2018   Prosthetic valve dysfunction 11/25/2018   Ectatic abdominal aorta ([place]) 11/25/2018   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 [place], [name], [address] Phone: [phone]   Fax:  [phone]  Name: Eric [name] MRN: [id number] Date of Birth: 21-Sep-1929

## 2021-01-22 ENCOUNTER — Other Ambulatory Visit: Payer: Self-pay | Admitting: Interventional Cardiology

## 2021-01-24 ENCOUNTER — Encounter: Payer: Self-pay | Admitting: Occupational Therapy

## 2021-01-24 ENCOUNTER — Other Ambulatory Visit: Payer: Self-pay

## 2021-01-24 ENCOUNTER — Encounter: Payer: Self-pay | Admitting: Physical Therapy

## 2021-01-24 ENCOUNTER — Ambulatory Visit: Payer: Medicare Other | Admitting: Physical Therapy

## 2021-01-24 ENCOUNTER — Ambulatory Visit: Payer: Medicare Other | Admitting: Occupational Therapy

## 2021-01-24 DIAGNOSIS — R41842 Visuospatial deficit: Secondary | ICD-10-CM

## 2021-01-24 DIAGNOSIS — M6281 Muscle weakness (generalized): Secondary | ICD-10-CM

## 2021-01-24 DIAGNOSIS — R262 Difficulty in walking, not elsewhere classified: Secondary | ICD-10-CM

## 2021-01-24 DIAGNOSIS — I69319 Unspecified symptoms and signs involving cognitive functions following cerebral infarction: Secondary | ICD-10-CM

## 2021-01-24 DIAGNOSIS — R278 Other lack of coordination: Secondary | ICD-10-CM

## 2021-01-24 NOTE — Patient Instructions (Addendum)
Wall Push-Up    With feet and hands shoulder-width apart, lean into wall, then push away from wall. Complete 2 sets of 10 reps.   Alphabet (Standing)    Holding lightweight ball/dumbbell in right hand, draw the alphabet letters. Focus on small letters with your shoulders staying down Repeat with other hand for set.  Copyright  VHI. All rights reserved.

## 2021-01-24 NOTE — Therapy (Signed)
[place]. [place], [name], [address] Phone: [phone]   Fax:  [phone]  Physical Therapy Treatment  Patient Details  Name: Eric [name] MRN: [id number] Date of Birth: 08/21/1929 Referring Provider (PT): Debbe Odea   Encounter Date: 01/24/2021   PT End of Session - 01/24/21 1008     Visit Number 13    Date for PT Re-Evaluation 03/09/21    Authorization Type [employer] Medicare    PT Start Time 0845    PT Stop Time 1012    PT Time Calculation (min) 87 min    Activity Tolerance Patient tolerated treatment well    Behavior During Therapy [facility] for tasks assessed/performed             Past Medical History:  Diagnosis Date   Atrial flutter, paroxysmal ([place])    a. dx 04-02-2014, s/p  successful cardioversion 04-06-2014.   Chronic anticoagulation    on Eliquis--  due to recurrent dvt's and pe's   Dyslipidemia    History of bladder cancer urologist-  dr [name]   02-13-2016  s/p TURBT per path high grade papillary urothelial carcinoma   History of DVT (deep vein thrombosis)    2000-- RLE   History of melanoma excision    left flank;  07/ 2014 left lower leg and right upper chest   History of prostate cancer    Gleason 6--  s/p  radical prostatectomy 06/ 2000 in [place]---  no recurrence   History of pulmonary embolus (PE)    2000 & 2005  bilateral   Hx of valvuloplasty 06/19/2007   a. 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 05/2007 CABG x 1: s/p SVG-OM;  b. 10/2010 Ex MV: EF 72%, inf attenuation w/o ischemia, brief run of PAT with exercise. To   Stroke ([place])    Thrombocytopenia ([place])    Thyroid goiter 2013   nodular   Wears hearing aid    BILATERAL    Wears partial dentures    UPPER    Past Surgical History:  Procedure Laterality Date   AORTIC VALVE REPLACEMENT (AVR)/CORONARY ARTERY BYPASS GRAFTING (CABG)  06-19-2007  dr [name]   SVG to OM1 ;   AVR w/ #23 Mitralflow pericardial and ligation left atrial appendage;  MV repair w/ #28 Sorin 3-D memo Ring Annuloplasty with repair ruptured chordar of P-2 flail segments   CARDIAC CATHETERIZATION  05-12-2007  dr Leonia Reeves   single vessel 30% ostial LAD,  80% LCx;  severe to critial AS,  moderate MR,  normal LVF, ef 70%,  normal right heart pressures and cardiac outputs,  mild elevated LV end-diastolic pressure     CARDIOVASCULAR STRESS TEST  11-02-2010  dr Irish Lack   normal nuclear study w/ no ischemia or infarct/scar/  normal LV function and wall motion , ef 72%   CARDIOVERSION N/A 04/06/2014   Procedure: CARDIOVERSION;  Surgeon: [name], MD;  Location: [place];  Service: Cardiovascular;  Laterality: N/A;   successful   CATARACT EXTRACTION W/ INTRAOCULAR LENS  IMPLANT, BILATERAL  2012  approx   CORONARY ARTERY BYPASS GRAFT     PROSTATECTOMY     RETROPUBIC RADICAL PROSTATECTOMY  06/ 2000  in [place]   RIGHT/LEFT HEART CATH AND CORONARY/GRAFT ANGIOGRAPHY N/A 10/29/2018   Procedure: RIGHT/LEFT HEART CATH AND CORONARY/GRAFT ANGIOGRAPHY;  Surgeon: [name], MD;  Location: [place] [facility];  Service: Cardiovascular;  Laterality: N/A;   ROTATOR CUFF REPAIR Left 09/1998   TEE WITHOUT CARDIOVERSION N/A 04/06/2014   Procedure: TRANSESOPHAGEAL ECHOCARDIOGRAM (TEE);  Surgeon: [name], MD;  Location: [facility] ENDOSCOPY;  Service: Cardiovascular;  Laterality: N/A;  mild focal basa LVH of the septum, ef 50-55%, s/p AV annuloplasty ring, elongated chordae, thickened leaflets, mild to mod. MR, multiple small jets/arotic bioprosthetic valve sits well in position, mild AI/ thickened TV w/ mild reurg./mild LA   TEE WITHOUT CARDIOVERSION N/A 10/22/2018   Procedure: TRANSESOPHAGEAL  ECHOCARDIOGRAM (TEE);  Surgeon: [name], MD;  Location: [facility];  Service: Cardiovascular;  Laterality: N/A;   TEE WITHOUT CARDIOVERSION N/A 11/25/2018   Procedure: TRANSESOPHAGEAL ECHOCARDIOGRAM (TEE);  Surgeon: [name], MD;  Location: [place];  Service: Open Heart Surgery;  Laterality: N/A;   TRANSCATHETER AORTIC VALVE REPLACEMENT, TRANSFEMORAL N/A 11/25/2018   Procedure: TRANSCATHETER AORTIC VALVE REPLACEMENT, TRANSFEMORAL;  Surgeon: [name], MD;  Location: [place];  Service: Open Heart Surgery;  Laterality: N/A;   TRANSTHORACIC ECHOCARDIOGRAM  03-23-2016   dr Irish Lack   EF 55-60%, reduced contribution atrial contraction to ventricular filling, due to increased ventricular diastolic pressure or atrial contratile dysfunction/  bioprosthetic AV w/ mod. regurg. (valve area 1.97cm^2)/ mild dilated ascending aorta/ mild thicken MV normal function annular ring prosthesis/severe LAE & mod. to sev.RAE/ PASP 61mHg/ mild TR/ ventricle septal motion show paradox   TRANSURETHRAL RESECTION OF BLADDER TUMOR N/A 02/13/2016   Procedure: TRANSURETHRAL RESECTION OF BLADDER TUMOR (TURBT);  Surgeon: [name] MD;  [facility]  Service: Urology;  Laterality: N/A;   TRANSURETHRAL RESECTION OF BLADDER TUMOR N/A 09/04/2016   Procedure: TRANSURETHRAL RESECTION OF BLADDER TUMOR (TURBT);  Surgeon: [name] MD;  [facility]  Service: Urology;  Laterality: N/A;    There were no vitals filed for this visit.   Subjective Assessment - 01/24/21 1003     Subjective Reports that he did take a walk with wife this weekend, reports that his wife commented on him shuffling steps, also went to a baseball game and had to do some stairs but did report having to really hold onto the hand rails                               [facility] Treatment/Exercise - 01/24/21 0001       Ambulation/Gait   Gait Comments  outside around the back building, trying to maintain a good pace and have him have a conversation while walking      High Level Balance   High Level Balance Activities Direction changes;Marching forwards;Marching backwards;Negotitating around obstacles;Negotiating over obstacles    High Level Balance Comments foot in box and then out on solid surface and then on dynamic surface, used rocker board front to back and side to side, airex balance beam side stepping , tandem walking, airex standing head turns, eyes closed and ball toss      Knee/Hip Exercises: Machines for Strengthening   Cybex Knee Extension 10# 2x12    Cybex Knee Flexion 25# 2x12                      PT Short Term Goals -  01/06/21 1032       PT SHORT TERM GOAL #1   Title Pt will be I and compliant with initial HEP.    Status Achieved               PT Long Term Goals - 01/20/21 0942       PT LONG TERM GOAL #1   Title Pt will be I and compliant with HEP.    Status Partially Met      PT LONG TERM GOAL #3   Title decrease TUG time to 12 seconds    Status Achieved                   Plan - 01/24/21 1009     Clinical Impression Statement I added rocker board and some other higher level balance activities, he did well, he does struggle if he thinks about it too muhc or goes slowly losing balance easily.  He tends to be back on his heels so I wanted to try the rockerboard, he did very well with this    PT Next Visit Plan continue to work on strength , balance, function and safety    Consulted and Agree with Plan of Care Patient             Patient will benefit from skilled therapeutic intervention in order to improve the following deficits and impairments:  Abnormal gait, Decreased coordination, Difficulty walking, Cardiopulmonary status limiting activity, Decreased endurance, Decreased activity tolerance, Decreased balance, Postural dysfunction, Decreased strength, Decreased  mobility  Visit Diagnosis: Muscle weakness (generalized)  Difficulty in walking, not elsewhere classified  Other lack of coordination     Problem List Patient Active Problem List   Diagnosis Date Noted   Cerebral thrombosis with cerebral infarction 11/25/2020   Acute right-sided weakness 11/24/2020   History of mitral valve repair 11/26/2018   Prosthetic valve dysfunction 11/25/2018   Ectatic abdominal aorta ([place]) 11/25/2018   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 [place], [name], [address] Phone: [phone]   Fax:  [phone]  Name: Eric [name] MRN: [id number] Date of Birth: 1929/10/13

## 2021-01-25 NOTE — Therapy (Signed)
[place]. [place], [name], [address] Phone: [phone]   Fax:  [phone]  Occupational Therapy Treatment & Discharge Summary  Patient Details  Name: Eric [name] MRN: [id number] Date of Birth: [birth year] Referring Provider (OT): Debbe Odea, MD   Encounter Date: 01/24/2021   OT End of Session - 01/24/21 0855     Visit Number 11    Number of Visits 13    Date for OT Re-Evaluation 02/27/21    Authorization Type [employer] ($10 copay/day)    Authorization Time Period VL: [place]    Progress Note Due on Visit 10    OT Start Time 0847    OT Stop Time [phone]    OT Time Calculation (min) 42 min    Activity Tolerance Patient tolerated treatment well    Behavior During Therapy [facility] for tasks assessed/performed            OCCUPATIONAL THERAPY DISCHARGE SUMMARY  Visits from Start of Care: 11  Current functional level related to goals / functional outcomes: Pt improved 9-Hole Peg Test by 15 sec w/ R hand and demo's 80% accuracy w/ environmental scanning and 100% legibility w/ handwriting   Remaining deficits: Unsteadiness on feet w/ ambulation and decreased balance/standing tolerance; BUE tremor   Education / Equipment: Memory compensatory strategies, fall prevention and compensatory strategies for safety during ADLs, HEP designed for [facility] and coordination,     Patient agrees to discharge. Patient goals were met. Patient is being discharged due to meeting the stated rehab goals and being pleased with the current functional level.    Past Medical History:  Diagnosis Date   Atrial flutter, paroxysmal ([place])    a. dx 04-02-2014, s/p  successful cardioversion 04-06-2014.   Chronic anticoagulation    on Eliquis--  due to recurrent dvt's and pe's   Dyslipidemia    History of bladder cancer urologist-  dr [name]   02-13-2016  s/p TURBT per path high grade papillary urothelial carcinoma   History of  DVT (deep vein thrombosis)    2000-- RLE   History of melanoma excision    left flank;  07/ 2014 left lower leg and right upper chest   History of prostate cancer    Gleason 6--  s/p  radical prostatectomy 06/ 2000 in [place]---  no recurrence   History of pulmonary embolus (PE)    2000 & 2005  bilateral   Hx of valvuloplasty 06/19/2007   a. 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 05/2007 CABG x 1: s/p SVG-OM;  b. 10/2010 Ex MV: EF 72%, inf attenuation w/o ischemia, brief run of PAT with exercise. To   Stroke ([place])    Thrombocytopenia ([place])    Thyroid goiter 2013   nodular   Wears hearing aid    BILATERAL   Wears partial dentures    UPPER    Past Surgical History:  Procedure Laterality Date   AORTIC VALVE REPLACEMENT (AVR)/CORONARY ARTERY BYPASS GRAFTING (CABG)  06-19-2007  dr [name]   SVG to OM1 ;   AVR w/ #23 Mitralflow pericardial and ligation left atrial appendage;  MV repair w/ #28 Sorin 3-D memo Ring Annuloplasty with repair ruptured chordar  of P-2 flail segments   CARDIAC CATHETERIZATION  05-12-2007  dr Leonia Reeves   single vessel 30% ostial LAD,  80% LCx;  severe to critial AS,  moderate MR,  normal LVF, ef 70%,  normal right heart pressures and cardiac outputs,  mild elevated LV end-diastolic pressure     CARDIOVASCULAR STRESS TEST  11-02-2010  dr Irish Lack   normal nuclear study w/ no ischemia or infarct/scar/  normal LV function and wall motion , ef 72%   CARDIOVERSION N/A 04/06/2014   Procedure: CARDIOVERSION;  Surgeon: [name], MD;  Location: [facility] ENDOSCOPY;  Service: Cardiovascular;  Laterality: N/A;   successful   CATARACT EXTRACTION W/ INTRAOCULAR LENS  IMPLANT, BILATERAL  2012  approx   CORONARY ARTERY BYPASS  GRAFT     PROSTATECTOMY     RETROPUBIC RADICAL PROSTATECTOMY  06/ 2000  in [place], [place]   RIGHT/LEFT HEART CATH AND CORONARY/GRAFT ANGIOGRAPHY N/A 10/29/2018   Procedure: RIGHT/LEFT HEART CATH AND CORONARY/GRAFT ANGIOGRAPHY;  Surgeon: [name], MD;  Location: [place] [facility];  Service: Cardiovascular;  Laterality: N/A;   ROTATOR CUFF REPAIR Left 09/1998   TEE WITHOUT CARDIOVERSION N/A 04/06/2014   Procedure: TRANSESOPHAGEAL ECHOCARDIOGRAM (TEE);  Surgeon: [name], MD;  Location: [facility] ENDOSCOPY;  Service: Cardiovascular;  Laterality: N/A;  mild focal basa LVH of the septum, ef 50-55%, s/p AV annuloplasty ring, elongated chordae, thickened leaflets, mild to mod. MR, multiple small jets/arotic bioprosthetic valve sits well in position, mild AI/ thickened TV w/ mild reurg./mild LA   TEE WITHOUT CARDIOVERSION N/A 10/22/2018   Procedure: TRANSESOPHAGEAL ECHOCARDIOGRAM (TEE);  Surgeon: [name], MD;  Location: [facility] ENDOSCOPY;  Service: Cardiovascular;  Laterality: N/A;   TEE WITHOUT CARDIOVERSION N/A 11/25/2018   Procedure: TRANSESOPHAGEAL ECHOCARDIOGRAM (TEE);  Surgeon: [name], MD;  Location: [place];  Service: Open Heart Surgery;  Laterality: N/A;   TRANSCATHETER AORTIC VALVE REPLACEMENT, TRANSFEMORAL N/A 11/25/2018   Procedure: TRANSCATHETER AORTIC VALVE REPLACEMENT, TRANSFEMORAL;  Surgeon: [name], MD;  Location: [place];  Service: Open Heart Surgery;  Laterality: N/A;   TRANSTHORACIC ECHOCARDIOGRAM  03-23-2016   dr Irish Lack   EF 55-60%, reduced contribution atrial contraction to ventricular filling, due to increased ventricular diastolic pressure or atrial contratile dysfunction/  bioprosthetic AV w/ mod. regurg. (valve area 1.97cm^2)/ mild dilated ascending aorta/ mild thicken MV normal function annular ring prosthesis/severe LAE & mod. to sev.RAE/ PASP 55mHg/ mild TR/ ventricle septal motion show paradox   TRANSURETHRAL RESECTION OF BLADDER TUMOR N/A 02/13/2016    Procedure: TRANSURETHRAL RESECTION OF BLADDER TUMOR (TURBT);  Surgeon: [name] MD;  [facility]  Service: Urology;  Laterality: N/A;   TRANSURETHRAL RESECTION OF BLADDER TUMOR N/A 09/04/2016   Procedure: TRANSURETHRAL RESECTION OF BLADDER TUMOR (TURBT);  Surgeon: [name] MD;  [facility]  Service: Urology;  Laterality: N/A;    There were no vitals filed for this visit.   Subjective Assessment - 01/24/21 0850     Subjective  "You've done a good job"    Pertinent History Lt frontal centrum semiovale infarct 2/2 to small vessel disease (onset 11/23/20), DVT/PE, paroxysmal a-fib on Eliquis, aortic valve replacement (x2) w/ bioprosthetic cardiac valve, mitral ring annuloplasty, TAVR, CAD, Lt rotator cuff repair sx (May 2000), HLD    Currently in Pain? No/denies             Treatment/Exercises - 01/24/21    Shoulder Strengthening: Standing Single-arm AROM/light strengthening w/ anchored theraband (yellow);  completed shoulder flexion, extension, and external rotation in standing requiring occ verbal/tactile cues for positioning    Scapular Stabilization Exercises Scapular wall push-ups for facilitation of scapular retraction; pt able to achieve fairly equal activation w/ increased consistency on L side. OT provided tactile and verbal cues prn for muscle activation  Shoulder alphabet w/ lightweight exercise ball and emphasis on scapular depression and stabilization; pt demo'd increased fatigue and was instructed to decreased size of movements and complete exercise w/ elbows flexed     PVC Pipe Tree Constructional activity w/ PVC pipe tree to facilitate bilateral coordination, Will, alternating attention, visual perception/memory, and problem solving; pt able to complete activity w/ Mod I, automatically demo'ing bilateral integration and self-correction         OT Education - 01/24/21 1513     Education Details Reviewed  current exercises included in HEP (single-arm strengthening w/ theraband) and included new exercises (see pt instructions)    Person(s) Educated Patient    Methods Explanation;Demonstration;Verbal cues;Handout    Comprehension Verbalized understanding;Returned demonstration             OT Short Term Goals - 12/27/20 1614       OT SHORT TERM GOAL #1   Title Pt will verbalize understanding of memory compensatory strategies to improve safety at home    Baseline Recent limitations w/ memory    Time 3    Period Weeks    Status Achieved   12/13/20   Target Date 12/23/20      OT SHORT TERM GOAL #2   Title Pt will identify at least 7/10 targets during environmental scanning activity w/out assist to improve safety w/ functional mobility    Time 3    Period Weeks    Status Achieved   12/20/20 - located 8/10 targets independently     OT SHORT TERM GOAL #3   Title Pt will improve coordination as evidenced by decreasing 9-HPT time by at least 5 sec w/ R, dominant hand    Baseline R hand 48 sec; L hand 36 sec    Time 3    Period Weeks    Status Achieved   12/27/20 - 41 sec; 12/20/20 - RUE 44 sec     OT SHORT TERM GOAL #4   Title Pt will demonstrate understanding of compensatory strategies, including use of AE, to implement during BADLs/IADLs to increase safety and independence prn    Baseline Decreased knowledge of compensatory strategies    Time 3    Period Weeks    Status Achieved   12/20/20 - pt reports he is satsfied w/ participation in functional activities at this time            [place] - 01/24/21 0859       OT [place] #1   Title Pt will be independent w/ HEP designed for [facility] and coordination by discharge    Baseline No HEP at this time    Time 6    Period Weeks    Status Achieved      OT LONG TERM GOAL #2   Title Pt will improve coordination as evidenced by decreasing 9-HPT time by at least 9 sec w/ R, dominant hand    Baseline R hand 48 sec; L hand 36 sec     Time 6    Period Weeks    Status Achieved   01/20/21 - 33 sec w/ R hand     OT LONG TERM GOAL #  3   Title Pt will write a sentence w/ at least 75% legibility and sizing in pt-determined acceptable level of time, using AE prn    Baseline Pt reports micrographia and increased time; pt keeps a written blog    Time 6    Period Weeks    Status Achieved   01/17/21 - wrote 20-word sentence in acceptable level of time; mild microphraphia; 100% legible     OT LONG TERM GOAL #4   Title Pt will be able to sequence through an IADL task (e.g., med mgmt, housekeeping) w/ Mod I    Baseline Decreased organization of thought and sequencing    Time 6    Period Weeks    Status Achieved             Plan - 01/24/21 1515     Clinical Impression Statement Mr. Gaiser is a 87 y/o male who has been seen in OP OT for R-sided weakness and decreased coordination s/p CVA 11/24/20. Pt has shown improvements in visual perception, coordination, and participation in functional activities w/ all STGs and LTGs met. Due to initial difficulty understanding positioning of single-arm light strengthening exercises, OT reviewed positioning and alignment to facilitate good carryover to home post-d/c and continued to address scapular strengthening and stabilization. Pt is appropriate for d/c from skilled OT to HEP at this time, reports he is satisfied with progress, and is currently agreeable to discharge plan.    OT Occupational Profile and History Detailed Assessment- Review of Records and additional review of physical, cognitive, psychosocial history related to current functional performance    Occupational performance deficits (Please refer to evaluation for details): ADL's;IADL's;Leisure;Social Participation    Body Structure / Function / Physical Skills ADL;Decreased knowledge of use of DME;Gait;Strength;Balance;Dexterity;GMC;UE functional use;Body mechanics;Hearing;Cardiopulmonary status limiting  activity;Endurance;IADL;ROM;Vision;Coordination;Mobility;Sensation;FMC;Decreased knowledge of precautions    Cognitive Skills Attention;Memory;Problem Solve;Safety Awareness;Sequencing    Psychosocial Skills Environmental  Adaptations    Rehab Potential Good    Clinical Decision Making Several treatment options, min-mod task modification necessary    Comorbidities Affecting Occupational Performance: Presence of comorbidities impacting occupational performance    Modification or Assistance to Complete Evaluation  Min-Moderate modification of tasks or assist with assess necessary to complete eval    OT Frequency 2x / week   May reduce frequency to 1x/week depending on progress   OT Duration 6 weeks    OT Treatment/Interventions Aquatic Therapy;Self-care/ADL training;DME and/or AE instruction;Balance training;Therapeutic activities;Cognitive remediation/compensation;Therapeutic exercise;Neuromuscular education;Functional Mobility Training;Visual/perceptual remediation/compensation;Patient/family education;Manual Therapy;Energy conservation;Electrical Stimulation    Plan D/C    OT Home Exercise Plan Coordination activities (handout provided) and light shoulder strengthening and scapular stabilization (single-arm flexion, extension, external/internal rotation w/ theraband; scapular retraction push-ups against wall; alphabet w/ weighted object)    Consulted and Agree with Plan of Care Patient;Family member/caregiver    Family Member Consulted Lelon Frohlich (wife)             Patient will benefit from skilled therapeutic intervention in order to improve the following deficits and impairments:   Body Structure / Function / Physical Skills: ADL, Decreased knowledge of use of DME, Gait, Strength, Balance, Dexterity, GMC, UE functional use, Body mechanics, Hearing, Cardiopulmonary status limiting activity, Endurance, IADL, ROM, Vision, Coordination, Mobility, Sensation, FMC, Decreased knowledge of  precautions Cognitive Skills: Attention, Memory, Problem Solve, Safety Awareness, Sequencing Psychosocial Skills: Environmental  Adaptations   Visit Diagnosis: Muscle weakness (generalized)  Other lack of coordination  Unspecified symptoms and signs involving cognitive functions following cerebral infarction  Visuospatial  deficit    Problem List Patient Active Problem List   Diagnosis Date Noted   Cerebral thrombosis with cerebral infarction 11/25/2020   Acute right-sided weakness 11/24/2020   History of mitral valve repair 11/26/2018   Prosthetic valve dysfunction 11/25/2018   Ectatic abdominal aorta ([place]) 11/25/2018   S/P valve-in-valve TAVR 11/25/2018   History of bladder cancer    History of pulmonary embolus (PE)    Acute on chronic diastolic heart failure ([place]) 04/23/2018   History of aortic valve replacement 04/23/2018   Atrial fibrillation ([place]) 04/23/2018   Coronary atherosclerosis of native coronary artery 02/25/2014   Hyperlipidemia 02/25/2014   Embolism and thrombosis ([place]) 03/11/2013    Kathrine Cords, OTR/L 01/25/2021, 3:36 PM  [place]. [place], [name], [address] Phone: [phone]   Fax:  [phone]  Name: YOMAR MEJORADO MRN: [id number] Date of Birth: 1930-02-14

## 2021-01-26 ENCOUNTER — Other Ambulatory Visit: Payer: Self-pay

## 2021-01-26 ENCOUNTER — Ambulatory Visit: Payer: Medicare Other | Admitting: Physical Therapy

## 2021-01-26 ENCOUNTER — Ambulatory Visit: Payer: Medicare Other | Admitting: Occupational Therapy

## 2021-01-26 ENCOUNTER — Encounter: Payer: Self-pay | Admitting: Physical Therapy

## 2021-01-26 DIAGNOSIS — R278 Other lack of coordination: Secondary | ICD-10-CM | POA: Diagnosis not present

## 2021-01-26 DIAGNOSIS — R262 Difficulty in walking, not elsewhere classified: Secondary | ICD-10-CM

## 2021-01-26 DIAGNOSIS — M6281 Muscle weakness (generalized): Secondary | ICD-10-CM

## 2021-01-26 NOTE — Therapy (Signed)
[place]. [place], [name], [address] Phone: [phone]   Fax:  [phone]  Physical Therapy Treatment  Patient Details  Name: Eric [name] MRN: [id number] Date of Birth: 06-08-1929 Referring Provider (PT): Debbe Odea   Encounter Date: 01/26/2021   PT End of Session - 01/26/21 0836     Visit Number 14    Date for PT Re-Evaluation 03/09/21    Authorization Type [employer] Medicare    PT Start Time 0800    PT Stop Time 0841    PT Time Calculation (min) 41 min    Activity Tolerance Patient tolerated treatment well    Behavior During Therapy [facility] for tasks assessed/performed             Past Medical History:  Diagnosis Date   Atrial flutter, paroxysmal ([place])    a. dx 04-02-2014, s/p  successful cardioversion 04-06-2014.   Chronic anticoagulation    on Eliquis--  due to recurrent dvt's and pe's   Dyslipidemia    History of bladder cancer urologist-  dr [name]   02-13-2016  s/p TURBT per path high grade papillary urothelial carcinoma   History of DVT (deep vein thrombosis)    2000-- RLE   History of melanoma excision    left flank;  07/ 2014 left lower leg and right upper chest   History of prostate cancer    Gleason 6--  s/p  radical prostatectomy 06/ 2000 in [place]---  no recurrence   History of pulmonary embolus (PE)    2000 & 2005  bilateral   Hx of valvuloplasty 06/19/2007   a. 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 05/2007 CABG x 1: s/p SVG-OM;  b. 10/2010 Ex MV: EF 72%, inf attenuation w/o ischemia, brief run of PAT with exercise. To   Stroke ([place])    Thrombocytopenia ([place])    Thyroid goiter 2013   nodular   Wears hearing aid    BILATERAL    Wears partial dentures    UPPER    Past Surgical History:  Procedure Laterality Date   AORTIC VALVE REPLACEMENT (AVR)/CORONARY ARTERY BYPASS GRAFTING (CABG)  06-19-2007  dr [name]   SVG to OM1 ;   AVR w/ #23 Mitralflow pericardial and ligation left atrial appendage;  MV repair w/ #28 Sorin 3-D memo Ring Annuloplasty with repair ruptured chordar of P-2 flail segments   CARDIAC CATHETERIZATION  05-12-2007  dr Leonia Reeves   single vessel 30% ostial LAD,  80% LCx;  severe to critial AS,  moderate MR,  normal LVF, ef 70%,  normal right heart pressures and cardiac outputs,  mild elevated LV end-diastolic pressure     CARDIOVASCULAR STRESS TEST  11-02-2010  dr Irish Lack   normal nuclear study w/ no ischemia or infarct/scar/  normal LV function and wall motion , ef 72%   CARDIOVERSION N/A 04/06/2014   Procedure: CARDIOVERSION;  Surgeon: [name], MD;  Location: [place];  Service: Cardiovascular;  Laterality: N/A;   successful   CATARACT EXTRACTION W/ INTRAOCULAR LENS  IMPLANT, BILATERAL  2012  approx   CORONARY ARTERY BYPASS GRAFT     PROSTATECTOMY     RETROPUBIC RADICAL PROSTATECTOMY  06/ 2000  in [place], [place]   RIGHT/LEFT HEART CATH AND CORONARY/GRAFT ANGIOGRAPHY N/A 10/29/2018   Procedure: RIGHT/LEFT HEART CATH AND CORONARY/GRAFT ANGIOGRAPHY;  Surgeon: [name], MD;  Location: [place] CV LAB;  Service: Cardiovascular;  Laterality: N/A;   ROTATOR CUFF REPAIR Left 09/1998   TEE WITHOUT CARDIOVERSION N/A 04/06/2014   Procedure: TRANSESOPHAGEAL ECHOCARDIOGRAM (TEE);  Surgeon: [name], MD;  Location: [facility] ENDOSCOPY;  Service: Cardiovascular;  Laterality: N/A;  mild focal basa LVH of the septum, ef 50-55%, s/p AV annuloplasty ring, elongated chordae, thickened leaflets, mild to mod. MR, multiple small jets/arotic bioprosthetic valve sits well in position, mild AI/ thickened TV w/ mild reurg./mild LA   TEE WITHOUT CARDIOVERSION N/A 10/22/2018   Procedure: TRANSESOPHAGEAL  ECHOCARDIOGRAM (TEE);  Surgeon: [name], MD;  Location: [facility] ENDOSCOPY;  Service: Cardiovascular;  Laterality: N/A;   TEE WITHOUT CARDIOVERSION N/A 11/25/2018   Procedure: TRANSESOPHAGEAL ECHOCARDIOGRAM (TEE);  Surgeon: [name], MD;  Location: [place];  Service: Open Heart Surgery;  Laterality: N/A;   TRANSCATHETER AORTIC VALVE REPLACEMENT, TRANSFEMORAL N/A 11/25/2018   Procedure: TRANSCATHETER AORTIC VALVE REPLACEMENT, TRANSFEMORAL;  Surgeon: [name], MD;  Location: [place];  Service: Open Heart Surgery;  Laterality: N/A;   TRANSTHORACIC ECHOCARDIOGRAM  03-23-2016   dr Irish Lack   EF 55-60%, reduced contribution atrial contraction to ventricular filling, due to increased ventricular diastolic pressure or atrial contratile dysfunction/  bioprosthetic AV w/ mod. regurg. (valve area 1.97cm^2)/ mild dilated ascending aorta/ mild thicken MV normal function annular ring prosthesis/severe LAE & mod. to sev.RAE/ PASP 75mHg/ mild TR/ ventricle septal motion show paradox   TRANSURETHRAL RESECTION OF BLADDER TUMOR N/A 02/13/2016   Procedure: TRANSURETHRAL RESECTION OF BLADDER TUMOR (TURBT);  Surgeon: [name] MD;  [facility]  Service: Urology;  Laterality: N/A;   TRANSURETHRAL RESECTION OF BLADDER TUMOR N/A 09/04/2016   Procedure: TRANSURETHRAL RESECTION OF BLADDER TUMOR (TURBT);  Surgeon: [name] MD;  [facility]  Service: Urology;  Laterality: N/A;    There were no vitals filed for this visit.   Subjective Assessment - 01/26/21 0802     Subjective no falls no stumbles, "just a little early today, a little tired"    Currently in Pain? No/denies                [facility] - 01/26/21 0001       Strength   Right Hip Flexion 4/5    Right Knee Extension 4-/5                           [facility] Adult PT Treatment/Exercise - 01/26/21 0001       Ambulation/Gait   Gait Comments  outside around the back building, trying to maintain a good pace and have him have a conversation while walking, grass, pine needles and significant slope      High Level Balance   High Level Balance Activities Direction changes;Marching forwards;Marching backwards;Negotitating around obstacles;Negotiating over obstacles    High Level Balance Comments foot in box and then out on solid surface and then on dynamic surface, used rocker board front to back and side to side, airex balance beam side stepping , tandem walking, airex standing head turns, eyes closed and ball toss      Knee/Hip Exercises: Aerobic   Nustep level 5 x 6 minutes, LE only      Knee/Hip Exercises: Machines for Strengthening   Cybex  Knee Extension 5# right only 2x10    Cybex Knee Flexion 20# right only 2x10                       PT Short Term Goals - 01/06/21 1032       PT SHORT TERM GOAL #1   Title Pt will be I and compliant with initial HEP.    Status Achieved               PT Long Term Goals - 01/26/21 0843       PT LONG TERM GOAL #1   Title Pt will be I and compliant with HEP.    Status Partially Met      PT LONG TERM GOAL #4   Title increase knee and ankle strength to 4+/5    Status Partially Met                   Plan - 01/26/21 0836     Clinical Impression Statement I am working on getting him more COG in mid to front foot, he tends to be on heels a lot and loses balance to the back most times, he does have some right leg weakness, with 6" lateral step ups and over obstacles the right knee would not fully straighten and he almost fell stepping up to the right.  Still with difficulty in the clinic on the dynamic surfaces, did well outside    PT Next Visit Plan continue to work on strength , balance, function and safety    Consulted and Agree with Plan of Care Patient             Patient will benefit from skilled therapeutic intervention in order to improve the  following deficits and impairments:  Abnormal gait, Decreased coordination, Difficulty walking, Cardiopulmonary status limiting activity, Decreased endurance, Decreased activity tolerance, Decreased balance, Postural dysfunction, Decreased strength, Decreased mobility  Visit Diagnosis: Muscle weakness (generalized)  Difficulty in walking, not elsewhere classified  Other lack of coordination     Problem List Patient Active Problem List   Diagnosis Date Noted   Cerebral thrombosis with cerebral infarction 11/25/2020   Acute right-sided weakness 11/24/2020   History of mitral valve repair 11/26/2018   Prosthetic valve dysfunction 11/25/2018   Ectatic abdominal aorta ([place]) 11/25/2018   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 [place], [name], [address] Phone: [phone]   Fax:  [phone]  Name: Eric [name] MRN: [id number] Date of Birth: December 14, 1929

## 2021-01-31 ENCOUNTER — Other Ambulatory Visit: Payer: Self-pay

## 2021-01-31 ENCOUNTER — Ambulatory Visit: Payer: Medicare Other | Admitting: Physical Therapy

## 2021-01-31 ENCOUNTER — Ambulatory Visit: Payer: Medicare Other | Admitting: Occupational Therapy

## 2021-01-31 ENCOUNTER — Encounter: Payer: Self-pay | Admitting: Physical Therapy

## 2021-01-31 DIAGNOSIS — R2689 Other abnormalities of gait and mobility: Secondary | ICD-10-CM

## 2021-01-31 DIAGNOSIS — R262 Difficulty in walking, not elsewhere classified: Secondary | ICD-10-CM

## 2021-01-31 DIAGNOSIS — M6281 Muscle weakness (generalized): Secondary | ICD-10-CM

## 2021-01-31 DIAGNOSIS — R278 Other lack of coordination: Secondary | ICD-10-CM | POA: Diagnosis not present

## 2021-01-31 NOTE — Therapy (Signed)
[place]. [place], [name], [address] Phone: [phone]   Fax:  [phone]  Physical Therapy Treatment  Patient Details  Name: TYRESSE JAYSON MRN: [id number] Date of Birth: [birth year] Referring Provider (PT): Debbe Odea   Encounter Date: 01/31/2021   PT End of Session - 01/31/21 0926     Visit Number 15    Date for PT Re-Evaluation 03/09/21    Authorization Type UHC Medicare    PT Start Time 0843    PT Stop Time 0928    PT Time Calculation (min) 45 min    Activity Tolerance Patient tolerated treatment well    Behavior During Therapy [facility] for tasks assessed/performed             Past Medical History:  Diagnosis Date   Atrial flutter, paroxysmal ([place])    a. dx 04-02-2014, s/p  successful cardioversion 04-06-2014.   Chronic anticoagulation    on Eliquis--  due to recurrent dvt's and pe's   Dyslipidemia    History of bladder cancer urologist-  dr [name]   02-13-2016  s/p TURBT per path high grade papillary urothelial carcinoma   History of DVT (deep vein thrombosis)    2000-- RLE   History of melanoma excision    left flank;  07/ 2014 left lower leg and right upper chest   History of prostate cancer    Gleason 6--  s/p  radical prostatectomy 06/ 2000 in [place]---  no recurrence   History of pulmonary embolus (PE)    2000 & 2005  bilateral   Hx of valvuloplasty 06/19/2007   a. 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 05/2007 CABG x 1: s/p SVG-OM;  b. 10/2010 Ex MV: EF 72%, inf attenuation w/o ischemia, brief run of PAT with exercise. To   Stroke ([place])    Thrombocytopenia ([place])    Thyroid goiter 2013   nodular   Wears hearing aid    BILATERAL    Wears partial dentures    UPPER    Past Surgical History:  Procedure Laterality Date   AORTIC VALVE REPLACEMENT (AVR)/CORONARY ARTERY BYPASS GRAFTING (CABG)  06-19-2007  dr [name]   SVG to OM1 ;   AVR w/ #23 Mitralflow pericardial and ligation left atrial appendage;  MV repair w/ #28 Sorin 3-D memo Ring Annuloplasty with repair ruptured chordar of P-2 flail segments   CARDIAC CATHETERIZATION  05-12-2007  dr Leonia Reeves   single vessel 30% ostial LAD,  80% LCx;  severe to critial AS,  moderate MR,  normal LVF, ef 70%,  normal right heart pressures and cardiac outputs,  mild elevated LV end-diastolic pressure     CARDIOVASCULAR STRESS TEST  11-02-2010  dr Irish Lack   normal nuclear study w/ no ischemia or infarct/scar/  normal LV function and wall motion , ef 72%   CARDIOVERSION N/A 04/06/2014   Procedure: CARDIOVERSION;  Surgeon: [name], MD;  Location: [place];  Service: Cardiovascular;  Laterality: N/A;   successful   CATARACT EXTRACTION W/ INTRAOCULAR LENS  IMPLANT, BILATERAL  2012  approx   CORONARY ARTERY BYPASS GRAFT     PROSTATECTOMY     RETROPUBIC RADICAL PROSTATECTOMY  06/ 2000  in [place], [place]   RIGHT/LEFT HEART CATH AND CORONARY/GRAFT ANGIOGRAPHY N/A 10/29/2018   Procedure: RIGHT/LEFT HEART CATH AND CORONARY/GRAFT ANGIOGRAPHY;  Surgeon: [name], MD;  Location: [place] CV LAB;  Service: Cardiovascular;  Laterality: N/A;   ROTATOR CUFF REPAIR Left 09/1998   TEE WITHOUT CARDIOVERSION N/A 04/06/2014   Procedure: TRANSESOPHAGEAL ECHOCARDIOGRAM (TEE);  Surgeon: [name], MD;  Location: [facility] ENDOSCOPY;  Service: Cardiovascular;  Laterality: N/A;  mild focal basa LVH of the septum, ef 50-55%, s/p AV annuloplasty ring, elongated chordae, thickened leaflets, mild to mod. MR, multiple small jets/arotic bioprosthetic valve sits well in position, mild AI/ thickened TV w/ mild reurg./mild LA   TEE WITHOUT CARDIOVERSION N/A 10/22/2018   Procedure: TRANSESOPHAGEAL  ECHOCARDIOGRAM (TEE);  Surgeon: [name], MD;  Location: [facility] ENDOSCOPY;  Service: Cardiovascular;  Laterality: N/A;   TEE WITHOUT CARDIOVERSION N/A 11/25/2018   Procedure: TRANSESOPHAGEAL ECHOCARDIOGRAM (TEE);  Surgeon: [name], MD;  Location: [place];  Service: Open Heart Surgery;  Laterality: N/A;   TRANSCATHETER AORTIC VALVE REPLACEMENT, TRANSFEMORAL N/A 11/25/2018   Procedure: TRANSCATHETER AORTIC VALVE REPLACEMENT, TRANSFEMORAL;  Surgeon: [name], MD;  Location: [place];  Service: Open Heart Surgery;  Laterality: N/A;   TRANSTHORACIC ECHOCARDIOGRAM  03-23-2016   dr Irish Lack   EF 55-60%, reduced contribution atrial contraction to ventricular filling, due to increased ventricular diastolic pressure or atrial contratile dysfunction/  bioprosthetic AV w/ mod. regurg. (valve area 1.97cm^2)/ mild dilated ascending aorta/ mild thicken MV normal function annular ring prosthesis/severe LAE & mod. to sev.RAE/ PASP 59mmHg/ mild TR/ ventricle septal motion show paradox   TRANSURETHRAL RESECTION OF BLADDER TUMOR N/A 02/13/2016   Procedure: TRANSURETHRAL RESECTION OF BLADDER TUMOR (TURBT);  Surgeon: Nickie Retort, MD;  Location: [facility];  Service: Urology;  Laterality: N/A;   TRANSURETHRAL RESECTION OF BLADDER TUMOR N/A 09/04/2016   Procedure: TRANSURETHRAL RESECTION OF BLADDER TUMOR (TURBT);  Surgeon: Nickie Retort, MD;  Location: [facility];  Service: Urology;  Laterality: N/A;    There were no vitals filed for this visit.   Subjective Assessment - 01/31/21 0846     Subjective no falls or stumbles, thought I would try a new pari of shoes today and see how they work                               [facility] Adult PT Treatment/Exercise - 01/31/21 0001       Ambulation/Gait   Gait Comments around the back building and then the parking island then through the grass and negotiating over obstacles      High Level Balance    High Level Balance Activities Backward walking    High Level Balance Comments on airex 6" toe touches, 10" cone touches, reaching, stepping over items onto airex, airex balance beam side stepping, then tandem walking, putting foot into box in front and to each side      Knee/Hip Exercises: Aerobic   Nustep level 5 x 6 minutes, LE only                       PT Short Term Goals - 01/06/21 1032       PT SHORT TERM GOAL #1   Title Pt will be I and compliant with initial HEP.    Status Achieved               PT Long  Term Goals - 01/26/21 0843       PT LONG TERM GOAL #1   Title Pt will be I and compliant with HEP.    Status Partially Met      PT LONG TERM GOAL #4   Title increase knee and ankle strength to 4+/5    Status Partially Met                   Plan - 01/31/21 0926     Clinical Impression Statement walked further today, a little SOB, some c/o leg fatigue, still did well with balance, does lose balance to the back mostly. working on him being able to correct with upper body    PT Next Visit Plan continue to work on strength , balance, function and safety    Consulted and Agree with Plan of Care Patient             Patient will benefit from skilled therapeutic intervention in order to improve the following deficits and impairments:  Abnormal gait, Decreased coordination, Difficulty walking, Cardiopulmonary status limiting activity, Decreased endurance, Decreased activity tolerance, Decreased balance, Postural dysfunction, Decreased strength, Decreased mobility  Visit Diagnosis: Muscle weakness (generalized)  Difficulty in walking, not elsewhere classified  Other lack of coordination  Other abnormalities of gait and mobility     Problem List Patient Active Problem List   Diagnosis Date Noted   Cerebral thrombosis with cerebral infarction 11/25/2020   Acute right-sided weakness 11/24/2020   History of mitral valve repair 11/26/2018    Prosthetic valve dysfunction 11/25/2018   Ectatic abdominal aorta ([place]) 11/25/2018   S/P valve-in-valve TAVR 11/25/2018   History of bladder cancer    History of pulmonary embolus (PE)    Acute on chronic diastolic heart failure ([place]) 04/23/2018   History of aortic valve replacement 04/23/2018   Atrial fibrillation ([place]) 04/23/2018   Coronary atherosclerosis of native coronary artery 02/25/2014   Hyperlipidemia 02/25/2014   Embolism and 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 [place], [name], [address] Phone: [phone]   Fax:  [phone]  Name: ZALYN AMEND MRN: [id number] Date of Birth: 08-26-1929

## 2021-02-02 ENCOUNTER — Other Ambulatory Visit: Payer: Self-pay

## 2021-02-02 ENCOUNTER — Ambulatory Visit: Payer: Medicare Other | Admitting: Occupational Therapy

## 2021-02-02 ENCOUNTER — Ambulatory Visit: Payer: Medicare Other | Admitting: Rehabilitative and Restorative Service Providers"

## 2021-02-02 DIAGNOSIS — M6281 Muscle weakness (generalized): Secondary | ICD-10-CM

## 2021-02-02 DIAGNOSIS — R278 Other lack of coordination: Secondary | ICD-10-CM | POA: Diagnosis not present

## 2021-02-02 DIAGNOSIS — R262 Difficulty in walking, not elsewhere classified: Secondary | ICD-10-CM

## 2021-02-02 NOTE — Therapy (Signed)
[place] [place], [name], [address] Phone: [phone]   Fax:  [phone]  Physical Therapy Treatment  Patient Details  Name: Eric [name] MRN: [id number] Date of Birth: [birth year] Referring Provider (PT): Debbe Odea   Encounter Date: 02/02/2021   PT End of Session - 02/02/21 1009     Visit Number 16    Date for PT Re-Evaluation 03/09/21    Authorization Type [employer] Medicare    PT Start Time 0845    PT Stop Time 0925    PT Time Calculation (min) 40 min    Activity Tolerance Patient tolerated treatment well    Behavior During Therapy [facility] for tasks assessed/performed             Past Medical History:  Diagnosis Date   Atrial flutter, paroxysmal ([place])    a. dx 04-02-2014, s/p  successful cardioversion 04-06-2014.   Chronic anticoagulation    on Eliquis--  due to recurrent dvt's and pe's   Dyslipidemia    History of bladder cancer urologist-  dr [name]   02-13-2016  s/p TURBT per path high grade papillary urothelial carcinoma   History of DVT (deep vein thrombosis)    2000-- RLE   History of melanoma excision    left flank;  07/ 2014 left lower leg and right upper chest   History of prostate cancer    Gleason 6--  s/p  radical prostatectomy 06/ 2000 in [place]---  no recurrence   History of pulmonary embolus (PE)    2000 & 2005  bilateral   Hx of valvuloplasty 06/19/2007   a. 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 05/2007 CABG x 1: s/p SVG-OM;  b. 10/2010 Ex MV: EF 72%, inf attenuation w/o ischemia, brief run of PAT with exercise. To   Stroke ([place])    Thrombocytopenia ([place])    Thyroid goiter 2013   nodular   Wears hearing aid    BILATERAL    Wears partial dentures    UPPER    Past Surgical History:  Procedure Laterality Date   AORTIC VALVE REPLACEMENT (AVR)/CORONARY ARTERY BYPASS GRAFTING (CABG)  06-19-2007  dr [name]   SVG to OM1 ;   AVR w/ #23 Mitralflow pericardial and ligation left atrial appendage;  MV repair w/ #28 Sorin 3-D memo Ring Annuloplasty with repair ruptured chordar of P-2 flail segments   CARDIAC CATHETERIZATION  05-12-2007  dr Leonia Reeves   single vessel 30% ostial LAD,  80% LCx;  severe to critial AS,  moderate Eric,  normal LVF, ef 70%,  normal right heart pressures and cardiac outputs,  mild elevated LV end-diastolic pressure     CARDIOVASCULAR STRESS TEST  11-02-2010  dr Irish Lack   normal nuclear study w/ no ischemia or infarct/scar/  normal LV function and wall motion , ef 72%   CARDIOVERSION N/A 04/06/2014   Procedure: CARDIOVERSION;  Surgeon: [name], MD;  Location: [place];  Service: Cardiovascular;  Laterality: N/A;   successful   CATARACT EXTRACTION W/ INTRAOCULAR LENS  IMPLANT, BILATERAL  2012  approx   CORONARY ARTERY BYPASS GRAFT     PROSTATECTOMY     RETROPUBIC RADICAL PROSTATECTOMY  06/ 2000  in [place]   RIGHT/LEFT HEART CATH AND CORONARY/GRAFT ANGIOGRAPHY N/A 10/29/2018   Procedure: RIGHT/LEFT HEART CATH AND CORONARY/GRAFT ANGIOGRAPHY;  Surgeon: [name], MD;  Location: [place] CV [facility];  Service: Cardiovascular;  Laterality: N/A;   ROTATOR CUFF REPAIR Left 09/1998   TEE WITHOUT CARDIOVERSION N/A 04/06/2014   Procedure: TRANSESOPHAGEAL ECHOCARDIOGRAM (TEE);  Surgeon: [name], MD;  Location: [facility] ENDOSCOPY;  Service: Cardiovascular;  Laterality: N/A;  mild focal basa LVH of the septum, ef 50-55%, s/p AV annuloplasty ring, elongated chordae, thickened leaflets, mild to mod. Eric, multiple small jets/arotic bioprosthetic valve sits well in position, mild AI/ thickened TV w/ mild reurg./mild LA   TEE WITHOUT CARDIOVERSION N/A 10/22/2018   Procedure: TRANSESOPHAGEAL  ECHOCARDIOGRAM (TEE);  Surgeon: [name], MD;  Location: [facility] ENDOSCOPY;  Service: Cardiovascular;  Laterality: N/A;   TEE WITHOUT CARDIOVERSION N/A 11/25/2018   Procedure: TRANSESOPHAGEAL ECHOCARDIOGRAM (TEE);  Surgeon: [name], MD;  Location: [place];  Service: Open Heart Surgery;  Laterality: N/A;   TRANSCATHETER AORTIC VALVE REPLACEMENT, TRANSFEMORAL N/A 11/25/2018   Procedure: TRANSCATHETER AORTIC VALVE REPLACEMENT, TRANSFEMORAL;  Surgeon: [name], MD;  Location: [place];  Service: Open Heart Surgery;  Laterality: N/A;   TRANSTHORACIC ECHOCARDIOGRAM  03-23-2016   dr Irish Lack   EF 55-60%, reduced contribution atrial contraction to ventricular filling, due to increased ventricular diastolic pressure or atrial contratile dysfunction/  bioprosthetic AV w/ mod. regurg. (valve area 1.97cm^2)/ mild dilated ascending aorta/ mild thicken MV normal function annular ring prosthesis/severe LAE & mod. to sev.RAE/ PASP 86mHg/ mild TR/ ventricle septal motion show paradox   TRANSURETHRAL RESECTION OF BLADDER TUMOR N/A 02/13/2016   Procedure: TRANSURETHRAL RESECTION OF BLADDER TUMOR (TURBT);  Surgeon: [name] MD;  [facility]  Service: Urology;  Laterality: N/A;   TRANSURETHRAL RESECTION OF BLADDER TUMOR N/A 09/04/2016   Procedure: TRANSURETHRAL RESECTION OF BLADDER TUMOR (TURBT);  Surgeon: [name] MD;  [facility]  Service: Urology;  Laterality: N/A;    There were no vitals filed for this visit.                      [employer]ult PT Treatment/Exercise - 02/02/21 0001       Ambulation/Gait   Gait Comments around the back building and then the parking island then through the grass and negotiating over obstacles      High Level Balance   High Level Balance Activities Backward walking;Side stepping;Tandem walking   CGA HHA required with tandem gait   High Level Balance Comments On AirEx:  6" step toe  taps 2x10, Alt LE taps to 2 different cones and back x10      Knee/Hip Exercises: Aerobic   Nustep level 5 x 6 minutes      Knee/Hip Exercises: Machines for Strengthening   Cybex Knee Extension 5# right only 2x10    Cybex Knee Flexion 20# right only 2x10                       PT Short Term Goals - 01/06/21 1032       PT SHORT TERM GOAL #1   Title Pt will be I and compliant with initial HEP.    Status Achieved               PT Long Term Goals - 02/02/21 1012       PT LONG TERM GOAL #1  Title Pt will be I and compliant with HEP.    Status Partially Met      PT LONG TERM GOAL #2   Title Pt will improv BERG balance score 46/56 to show improved balance    Status On-going      PT LONG TERM GOAL #4   Title increase knee and ankle strength to 4+/5    Status Partially Met                   Plan - 02/02/21 1009     Clinical Impression Statement Eric [name] continues to progress towards goal related activities.  Pt with difficulty standing on AirEx and continues to primarily lose balance posteriorly.  He had most difficulty with tapping foot to 2 cones and back to starting point, requiring CGA to maintain balance.  He did not have any loss of balance with outside ambulation over various surfaces, but did take his time and caution with ambulation over unlevel grass.  He required front HHA with tandem gait with increased use of hands to maintain balance.  He continues to require skilled PT to progress towards goal related activities.    PT Treatment/Interventions ADLs/Self Care Home Management;Gait training;Neuromuscular re-education;Balance training;Therapeutic exercise;Therapeutic activities;Functional mobility training;Stair training;Patient/family education;Manual techniques    PT Next Visit Plan continue to work on strength , balance, function and safety    Consulted and Agree with Plan of Care Patient             Patient will benefit from skilled  therapeutic intervention in order to improve the following deficits and impairments:  Abnormal gait, Decreased coordination, Difficulty walking, Cardiopulmonary status limiting activity, Decreased endurance, Decreased activity tolerance, Decreased balance, Postural dysfunction, Decreased strength, Decreased mobility  Visit Diagnosis: Muscle weakness (generalized)  Difficulty in walking, not elsewhere classified  Other lack of coordination     Problem List Patient Active Problem List   Diagnosis Date Noted   Cerebral thrombosis with cerebral infarction 11/25/2020   Acute right-sided weakness 11/24/2020   History of mitral valve repair 11/26/2018   Prosthetic valve dysfunction 11/25/2018   Ectatic abdominal aorta ([place]) 11/25/2018   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 [place], [name], [address] Phone: [phone]   Fax:  [phone]  Name: Eric [name] MRN: [id number] Date of Birth: [birth year]

## 2021-02-04 ENCOUNTER — Other Ambulatory Visit: Payer: Self-pay | Admitting: Interventional Cardiology

## 2021-02-06 NOTE — Telephone Encounter (Signed)
Prescription refill request for Eliquis received. Indication:Afib  Last office visit: 12/09/20 Irish Lack)  Scr: 0.85 (11/25/20) Age: 87 Weight: 85.7kg  Appropriate dose and refill sent to requested pharmacy.

## 2021-02-07 ENCOUNTER — Ambulatory Visit: Payer: Medicare Other | Admitting: Physical Therapy

## 2021-02-07 ENCOUNTER — Ambulatory Visit: Payer: Medicare Other | Admitting: Occupational Therapy

## 2021-02-07 ENCOUNTER — Encounter: Payer: Self-pay | Admitting: Physical Therapy

## 2021-02-07 ENCOUNTER — Other Ambulatory Visit: Payer: Self-pay

## 2021-02-07 DIAGNOSIS — R262 Difficulty in walking, not elsewhere classified: Secondary | ICD-10-CM

## 2021-02-07 DIAGNOSIS — R278 Other lack of coordination: Secondary | ICD-10-CM | POA: Diagnosis not present

## 2021-02-07 DIAGNOSIS — R2689 Other abnormalities of gait and mobility: Secondary | ICD-10-CM

## 2021-02-07 DIAGNOSIS — M6281 Muscle weakness (generalized): Secondary | ICD-10-CM

## 2021-02-07 NOTE — Therapy (Signed)
[address]. [place], [name], [address] Phone: [phone]   Fax:  [phone]  Physical Therapy Treatment  Patient Details  Name: Eric [name] MRN: [id number] Date of Birth: July 10, 1929 Referring Provider (PT): Debbe Odea   Encounter Date: 02/07/2021   PT End of Session - 02/07/21 0925     Visit Number 17    Date for PT Re-Evaluation 03/09/21    Authorization Type UHC Medicare    PT Start Time 9675    PT Stop Time 0927    PT Time Calculation (min) 43 min    Activity Tolerance Patient tolerated treatment well    Behavior During Therapy [facility] for tasks assessed/performed             Past Medical History:  Diagnosis Date   Atrial flutter, paroxysmal ([place])    a. dx 04-02-2014, s/p  successful cardioversion 04-06-2014.   Chronic anticoagulation    on Eliquis--  due to recurrent dvt's and pe's   Dyslipidemia    History of bladder cancer urologist-  dr [name]   02-13-2016  s/p TURBT per path high grade papillary urothelial carcinoma   History of DVT (deep vein thrombosis)    2000-- RLE   History of melanoma excision    left flank;  07/ 2014 left lower leg and right upper chest   History of prostate cancer    Gleason 6--  s/p  radical prostatectomy 06/ 2000 in [place]---  no recurrence   History of pulmonary embolus (PE)    2000 & 2005  bilateral   Hx of valvuloplasty 06/19/2007   a. 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 05/2007 CABG x 1: s/p SVG-OM;  b. 10/2010 Ex MV: EF 72%, inf attenuation w/o ischemia, brief run of PAT with exercise. To   Stroke ([place])    Thrombocytopenia ([place])    Thyroid goiter 2013   nodular   Wears hearing aid    BILATERAL    Wears partial dentures    UPPER    Past Surgical History:  Procedure Laterality Date   AORTIC VALVE REPLACEMENT (AVR)/CORONARY ARTERY BYPASS GRAFTING (CABG)  06-19-2007  dr [name]   SVG to OM1 ;   AVR w/ #23 Mitralflow pericardial and ligation left atrial appendage;  MV repair w/ #28 Sorin 3-D memo Ring Annuloplasty with repair ruptured chordar of P-2 flail segments   CARDIAC CATHETERIZATION  05-12-2007  dr Leonia Reeves   single vessel 30% ostial LAD,  80% LCx;  severe to critial AS,  moderate MR,  normal LVF, ef 70%,  normal right heart pressures and cardiac outputs,  mild elevated LV end-diastolic pressure     CARDIOVASCULAR STRESS TEST  11-02-2010  dr Irish Lack   normal nuclear study w/ no ischemia or infarct/scar/  normal LV function and wall motion , ef 72%   CARDIOVERSION N/A 04/06/2014   Procedure: CARDIOVERSION;  Surgeon: [name], MD;  Location: [facility];  Service: Cardiovascular;  Laterality: N/A;   successful   CATARACT EXTRACTION W/ INTRAOCULAR LENS  IMPLANT, BILATERAL  2012  approx   CORONARY ARTERY BYPASS GRAFT     PROSTATECTOMY     RETROPUBIC RADICAL PROSTATECTOMY  06/ 2000  in [place], [place]   RIGHT/LEFT HEART CATH AND CORONARY/GRAFT ANGIOGRAPHY N/A 10/29/2018   Procedure: RIGHT/LEFT HEART CATH AND CORONARY/GRAFT ANGIOGRAPHY;  Surgeon: [name], MD;  Location: [place] [facility];  Service: Cardiovascular;  Laterality: N/A;   ROTATOR CUFF REPAIR Left 09/1998   TEE WITHOUT CARDIOVERSION N/A 04/06/2014   Procedure: TRANSESOPHAGEAL ECHOCARDIOGRAM (TEE);  Surgeon: [name], MD;  Location: [facility] ENDOSCOPY;  Service: Cardiovascular;  Laterality: N/A;  mild focal basa LVH of the septum, ef 50-55%, s/p AV annuloplasty ring, elongated chordae, thickened leaflets, mild to mod. MR, multiple small jets/arotic bioprosthetic valve sits well in position, mild AI/ thickened TV w/ mild reurg./mild LA   TEE WITHOUT CARDIOVERSION N/A 10/22/2018   Procedure: TRANSESOPHAGEAL  ECHOCARDIOGRAM (TEE);  Surgeon: [name], MD;  Location: [facility] ENDOSCOPY;  Service: Cardiovascular;  Laterality: N/A;   TEE WITHOUT CARDIOVERSION N/A 11/25/2018   Procedure: TRANSESOPHAGEAL ECHOCARDIOGRAM (TEE);  Surgeon: [name], MD;  Location: [facility];  Service: Open Heart Surgery;  Laterality: N/A;   TRANSCATHETER AORTIC VALVE REPLACEMENT, TRANSFEMORAL N/A 11/25/2018   Procedure: TRANSCATHETER AORTIC VALVE REPLACEMENT, TRANSFEMORAL;  Surgeon: [name], MD;  Location: [facility];  Service: Open Heart Surgery;  Laterality: N/A;   TRANSTHORACIC ECHOCARDIOGRAM  03-23-2016   dr Irish Lack   EF 55-60%, reduced contribution atrial contraction to ventricular filling, due to increased ventricular diastolic pressure or atrial contratile dysfunction/  bioprosthetic AV w/ mod. regurg. (valve area 1.97cm^2)/ mild dilated ascending aorta/ mild thicken MV normal function annular ring prosthesis/severe LAE & mod. to sev.RAE/ PASP 67mHg/ mild TR/ ventricle septal motion show paradox   TRANSURETHRAL RESECTION OF BLADDER TUMOR N/A 02/13/2016   Procedure: TRANSURETHRAL RESECTION OF BLADDER TUMOR (TURBT);  Surgeon: [name] MD;  [facility]  Service: Urology;  Laterality: N/A;   TRANSURETHRAL RESECTION OF BLADDER TUMOR N/A 09/04/2016   Procedure: TRANSURETHRAL RESECTION OF BLADDER TUMOR (TURBT);  Surgeon: [name] MD;  [facility]  Service: Urology;  Laterality: N/A;    There were no vitals filed for this visit.   Subjective Assessment - 02/07/21 0856     Subjective I have been doing okay, some times I do not pick up my feet very well    Currently in Pain? No/denies                               [facility] Adult PT Treatment/Exercise - 02/07/21 0001       Ambulation/Gait   Gait Comments around the back building and then the parking island then through the grass and negotiating over obstacles      High Level  Balance   High Level Balance Comments On AirEx:  6" step toe taps 2x10, Alt LE taps to 2 different cones and back x10, airex balance beam tandem walk and side stepping, stepping over objects sideways, stepping on and off the airex side to side and front to back, airex ball toss and airex reaching                       PT Short Term Goals - 01/06/21 1032       PT SHORT TERM GOAL #1   Title Pt will be I and compliant with initial HEP.    Status Achieved               PT Long Term Goals - 02/02/21 1012  PT LONG TERM GOAL #1   Title Pt will be I and compliant with HEP.    Status Partially Met      PT LONG TERM GOAL #2   Title Pt will improv BERG balance score 46/56 to show improved balance    Status On-going      PT LONG TERM GOAL #4   Title increase knee and ankle strength to 4+/5    Status Partially Met                   Plan - 02/07/21 0926     Clinical Impression Statement We started talking about what to do after PT, we will need to recheck berg and come up wiuth high level balance HEP, as well as some strength for him, he did the best he has done today with less LOB and less PT assist    PT Next Visit Plan start work on after PT HEP    Consulted and Agree with Plan of Care Patient             Patient will benefit from skilled therapeutic intervention in order to improve the following deficits and impairments:  Abnormal gait, Decreased coordination, Difficulty walking, Cardiopulmonary status limiting activity, Decreased endurance, Decreased activity tolerance, Decreased balance, Postural dysfunction, Decreased strength, Decreased mobility  Visit Diagnosis: Muscle weakness (generalized)  Difficulty in walking, not elsewhere classified  Other lack of coordination  Other abnormalities of gait and mobility     Problem List Patient Active Problem List   Diagnosis Date Noted   Cerebral thrombosis with cerebral infarction  11/25/2020   Acute right-sided weakness 11/24/2020   History of mitral valve repair 11/26/2018   Prosthetic valve dysfunction 11/25/2018   Ectatic abdominal aorta ([place]) 11/25/2018   S/P valve-in-valve TAVR 11/25/2018   History of bladder cancer    History of pulmonary embolus (PE)    Acute on chronic diastolic heart failure ([place]) 04/23/2018   History of aortic valve replacement 04/23/2018   Atrial fibrillation ([place]) 04/23/2018   Coronary atherosclerosis of native coronary artery 02/25/2014   Hyperlipidemia 02/25/2014   Embolism and thrombosis ([place]) 03/11/2013    Sumner Boast, PT 02/07/2021, 9:28 AM  [place]. [place], [name], [address] Phone: [phone]   Fax:  [phone]  Name: NADIA TORR MRN: [id number] Date of Birth: Jan 04, 1930

## 2021-02-09 ENCOUNTER — Encounter: Payer: Medicare Other | Admitting: Occupational Therapy

## 2021-02-09 ENCOUNTER — Ambulatory Visit: Payer: Medicare Other | Admitting: Physical Therapy

## 2021-02-09 ENCOUNTER — Encounter: Payer: Self-pay | Admitting: Physical Therapy

## 2021-02-09 ENCOUNTER — Other Ambulatory Visit: Payer: Self-pay

## 2021-02-09 DIAGNOSIS — R278 Other lack of coordination: Secondary | ICD-10-CM | POA: Diagnosis not present

## 2021-02-09 DIAGNOSIS — M6281 Muscle weakness (generalized): Secondary | ICD-10-CM

## 2021-02-09 DIAGNOSIS — I69319 Unspecified symptoms and signs involving cognitive functions following cerebral infarction: Secondary | ICD-10-CM

## 2021-02-09 DIAGNOSIS — R262 Difficulty in walking, not elsewhere classified: Secondary | ICD-10-CM

## 2021-02-09 DIAGNOSIS — R2689 Other abnormalities of gait and mobility: Secondary | ICD-10-CM

## 2021-02-09 NOTE — Therapy (Signed)
[facility]. [place], [name], [address] Phone: [phone]   Fax:  [phone]  Physical Therapy Treatment  Patient Details  Name: Eric [name] MRN: [id number] Date of Birth: [birth year] Referring Provider (PT): Debbe Odea   Encounter Date: 02/09/2021   PT End of Session - 02/09/21 0937     Visit Number 18    Date for PT Re-Evaluation 03/09/21    Authorization Type UHC Medicare    PT Start Time 0843    PT Stop Time 0926    PT Time Calculation (min) 43 min    Activity Tolerance Patient tolerated treatment well    Behavior During Therapy [facility] for tasks assessed/performed             Past Medical History:  Diagnosis Date   Atrial flutter, paroxysmal ([place])    a. dx 04-02-2014, s/p  successful cardioversion 04-06-2014.   Chronic anticoagulation    on Eliquis--  due to recurrent dvt's and pe's   Dyslipidemia    History of bladder cancer urologist-  dr [name]   02-13-2016  s/p TURBT per path high grade papillary urothelial carcinoma   History of DVT (deep vein thrombosis)    2000-- RLE   History of melanoma excision    left flank;  07/ 2014 left lower leg and right upper chest   History of prostate cancer    Gleason 6--  s/p  radical prostatectomy 06/ 2000 in [place]---  no recurrence   History of pulmonary embolus (PE)    2000 & 2005  bilateral   Hx of valvuloplasty 06/19/2007   a. 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 05/2007 CABG x 1: s/p SVG-OM;  b. 10/2010 Ex MV: EF 72%, inf attenuation w/o ischemia, brief run of PAT with exercise. To   Stroke ([place])    Thrombocytopenia ([place])    Thyroid goiter 2013   nodular   Wears hearing aid    BILATERAL    Wears partial dentures    UPPER    Past Surgical History:  Procedure Laterality Date   AORTIC VALVE REPLACEMENT (AVR)/CORONARY ARTERY BYPASS GRAFTING (CABG)  06-19-2007  dr [name]   SVG to OM1 ;   AVR w/ #23 Mitralflow pericardial and ligation left atrial appendage;  MV repair w/ #28 Sorin 3-D memo Ring Annuloplasty with repair ruptured chordar of P-2 flail segments   CARDIAC CATHETERIZATION  05-12-2007  dr Leonia Reeves   single vessel 30% ostial LAD,  80% LCx;  severe to critial AS,  moderate MR,  normal LVF, ef 70%,  normal right heart pressures and cardiac outputs,  mild elevated LV end-diastolic pressure     CARDIOVASCULAR STRESS TEST  11-02-2010  dr Irish Lack   normal nuclear study w/ no ischemia or infarct/scar/  normal LV function and wall motion , ef 72%   CARDIOVERSION N/A 04/06/2014   Procedure: CARDIOVERSION;  Surgeon: [name], MD;  Location: [place];  Service: Cardiovascular;  Laterality: N/A;   successful   CATARACT EXTRACTION W/ INTRAOCULAR LENS  IMPLANT, BILATERAL  2012  approx   CORONARY ARTERY BYPASS GRAFT     PROSTATECTOMY     RETROPUBIC RADICAL PROSTATECTOMY  06/ 2000  in [place]   RIGHT/LEFT HEART CATH AND CORONARY/GRAFT ANGIOGRAPHY N/A 10/29/2018   Procedure: RIGHT/LEFT HEART CATH AND CORONARY/GRAFT ANGIOGRAPHY;  Surgeon: [name], MD;  Location: [place] CV LAB;  Service: Cardiovascular;  Laterality: N/A;   ROTATOR CUFF REPAIR Left 09/1998   TEE WITHOUT CARDIOVERSION N/A 04/06/2014   Procedure: TRANSESOPHAGEAL ECHOCARDIOGRAM (TEE);  Surgeon: [name], MD;  Location: [facility] ENDOSCOPY;  Service: Cardiovascular;  Laterality: N/A;  mild focal basa LVH of the septum, ef 50-55%, s/p AV annuloplasty ring, elongated chordae, thickened leaflets, mild to mod. MR, multiple small jets/arotic bioprosthetic valve sits well in position, mild AI/ thickened TV w/ mild reurg./mild LA   TEE WITHOUT CARDIOVERSION N/A 10/22/2018   Procedure: TRANSESOPHAGEAL  ECHOCARDIOGRAM (TEE);  Surgeon: [name], MD;  Location: [facility] ENDOSCOPY;  Service: Cardiovascular;  Laterality: N/A;   TEE WITHOUT CARDIOVERSION N/A 11/25/2018   Procedure: TRANSESOPHAGEAL ECHOCARDIOGRAM (TEE);  Surgeon: [name], MD;  Location: [place];  Service: Open Heart Surgery;  Laterality: N/A;   TRANSCATHETER AORTIC VALVE REPLACEMENT, TRANSFEMORAL N/A 11/25/2018   Procedure: TRANSCATHETER AORTIC VALVE REPLACEMENT, TRANSFEMORAL;  Surgeon: [name], MD;  Location: [place];  Service: Open Heart Surgery;  Laterality: N/A;   TRANSTHORACIC ECHOCARDIOGRAM  03-23-2016   dr Irish Lack   EF 55-60%, reduced contribution atrial contraction to ventricular filling, due to increased ventricular diastolic pressure or atrial contratile dysfunction/  bioprosthetic AV w/ mod. regurg. (valve area 1.97cm^2)/ mild dilated ascending aorta/ mild thicken MV normal function annular ring prosthesis/severe LAE & mod. to sev.RAE/ PASP 23mHg/ mild TR/ ventricle septal motion show paradox   TRANSURETHRAL RESECTION OF BLADDER TUMOR N/A 02/13/2016   Procedure: TRANSURETHRAL RESECTION OF BLADDER TUMOR (TURBT);  Surgeon: [name] MD;  [facility]  Service: Urology;  Laterality: N/A;   TRANSURETHRAL RESECTION OF BLADDER TUMOR N/A 09/04/2016   Procedure: TRANSURETHRAL RESECTION OF BLADDER TUMOR (TURBT);  Surgeon: [name] MD;  [facility]  Service: Urology;  Laterality: N/A;    There were no vitals filed for this visit.   Subjective Assessment - 02/09/21 0844     Subjective No problems in the last two day                               [facility]/Exercise - 02/09/21 0001       Ambulation/Gait   Gait Comments around the back building and then the parking island then through the grass and negotiating over obstacles      Knee/Hip Exercises: Aerobic   Nustep level 5 x 6 minutes      Knee/Hip  Exercises: Machines for Strengthening   Cybex Knee Extension 5# right only 2x10    Cybex Knee Flexion 20# right only 2x10      Knee/Hip Exercises: Standing   Heel Raises Both;20 reps      Knee/Hip Exercises: Seated   Other Seated Knee/Hip Exercises toe taps and yellow tband DF, very weak                     PT Education - 02/09/21 0912     Education Details Patient will be going on vaction next month, we started education on HEP with tbands did not give pictures today but gave bands and performed Leg extension, abduction, biceps, triceps, chest press and rows and extensions    Person(s) Educated Patient    Methods  Explanation;Demonstration;Tactile cues;Verbal cues    Comprehension Verbalized understanding;Returned demonstration;Verbal cues required;Tactile cues required              PT Short Term Goals - 01/06/21 1032       PT SHORT TERM GOAL #1   Title Pt will be I and compliant with initial HEP.    Status Achieved               PT Long Term Goals - 02/09/21 0940       PT LONG TERM GOAL #1   Title Pt will be I and compliant with HEP.    Status Partially Met                   Plan - 02/09/21 0938     Clinical Impression Statement We will work over the next month to get a plan for what to do after HEP, gave the tbands  Very weak with the DF especially the left.    PT Next Visit Plan start work on after PT HEP    Consulted and Agree with Plan of Care Patient             Patient will benefit from skilled therapeutic intervention in order to improve the following deficits and impairments:  Abnormal gait, Decreased coordination, Difficulty walking, Cardiopulmonary status limiting activity, Decreased endurance, Decreased activity tolerance, Decreased balance, Postural dysfunction, Decreased strength, Decreased mobility  Visit Diagnosis: Muscle weakness (generalized)  Difficulty in walking, not elsewhere classified  Other lack of  coordination  Other abnormalities of gait and mobility  Unspecified symptoms and signs involving cognitive functions following cerebral infarction     Problem List Patient Active Problem List   Diagnosis Date Noted   Cerebral thrombosis with cerebral infarction 11/25/2020   Acute right-sided weakness 11/24/2020   History of mitral valve repair 11/26/2018   Prosthetic valve dysfunction 11/25/2018   Ectatic abdominal aorta ([place]) 11/25/2018   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 [place], [name], [address] Phone: [phone]   Fax:  [phone]  Name: LUISFERNANDO BRIGHTWELL MRN: [id number] Date of Birth: June 11, 1929

## 2021-02-14 ENCOUNTER — Other Ambulatory Visit: Payer: Self-pay

## 2021-02-14 ENCOUNTER — Ambulatory Visit: Payer: Medicare Other | Admitting: Physical Therapy

## 2021-02-14 ENCOUNTER — Encounter: Payer: Medicare Other | Admitting: Occupational Therapy

## 2021-02-14 ENCOUNTER — Encounter: Payer: Self-pay | Admitting: Physical Therapy

## 2021-02-14 DIAGNOSIS — R262 Difficulty in walking, not elsewhere classified: Secondary | ICD-10-CM

## 2021-02-14 DIAGNOSIS — R2689 Other abnormalities of gait and mobility: Secondary | ICD-10-CM

## 2021-02-14 DIAGNOSIS — M6281 Muscle weakness (generalized): Secondary | ICD-10-CM

## 2021-02-14 DIAGNOSIS — R278 Other lack of coordination: Secondary | ICD-10-CM

## 2021-02-14 NOTE — Therapy (Signed)
[facility]. [place], [name], [address] Phone: [phone]   Fax:  [phone]  Physical Therapy Treatment  Patient Details  Name: Eric [name] MRN: [id number] Date of Birth: February 16, 1930 Referring Provider (PT): Debbe Odea   Encounter Date: 02/14/2021   PT End of Session - 02/14/21 0919     Visit Number 19    Date for PT Re-Evaluation 03/09/21    Authorization Type UHC Medicare    PT Start Time 0842    PT Stop Time 0929    PT Time Calculation (min) 47 min    Activity Tolerance Patient tolerated treatment well    Behavior During Therapy [facility] for tasks assessed/performed             Past Medical History:  Diagnosis Date   Atrial flutter, paroxysmal ([place])    a. dx 04-02-2014, s/p  successful cardioversion 04-06-2014.   Chronic anticoagulation    on Eliquis--  due to recurrent dvt's and pe's   Dyslipidemia    History of bladder cancer urologist-  dr [name]   02-13-2016  s/p TURBT per path high grade papillary urothelial carcinoma   History of DVT (deep vein thrombosis)    2000-- RLE   History of melanoma excision    left flank;  07/ 2014 left lower leg and right upper chest   History of prostate cancer    Gleason 6--  s/p  radical prostatectomy 06/ 2000 in [place]---  no recurrence   History of pulmonary embolus (PE)    2000 & 2005  bilateral   Hx of valvuloplasty 06/19/2007   a. 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 05/2007 CABG x 1: s/p SVG-OM;  b. 10/2010 Ex MV: EF 72%, inf attenuation w/o ischemia, brief run of PAT with exercise. To   Stroke ([place])    Thrombocytopenia ([place])    Thyroid goiter 2013   nodular   Wears hearing aid    BILATERAL    Wears partial dentures    UPPER    Past Surgical History:  Procedure Laterality Date   AORTIC VALVE REPLACEMENT (AVR)/CORONARY ARTERY BYPASS GRAFTING (CABG)  06-19-2007  dr [name]   SVG to OM1 ;   AVR w/ #23 Mitralflow pericardial and ligation left atrial appendage;  MV repair w/ #28 Sorin 3-D memo Ring Annuloplasty with repair ruptured chordar of P-2 flail segments   CARDIAC CATHETERIZATION  05-12-2007  dr Leonia Reeves   single vessel 30% ostial LAD,  80% LCx;  severe to critial AS,  moderate MR,  normal LVF, ef 70%,  normal right heart pressures and cardiac outputs,  mild elevated LV end-diastolic pressure     CARDIOVASCULAR STRESS TEST  11-02-2010  dr Irish Lack   normal nuclear study w/ no ischemia or infarct/scar/  normal LV function and wall motion , ef 72%   CARDIOVERSION N/A 04/06/2014   Procedure: CARDIOVERSION;  Surgeon: [name], MD;  Location: [place];  Service: Cardiovascular;  Laterality: N/A;   successful   CATARACT EXTRACTION W/ INTRAOCULAR LENS  IMPLANT, BILATERAL  2012  approx   CORONARY ARTERY BYPASS GRAFT     PROSTATECTOMY     RETROPUBIC RADICAL PROSTATECTOMY  06/ 2000  in [place], [place]   RIGHT/LEFT HEART CATH AND CORONARY/GRAFT ANGIOGRAPHY N/A 10/29/2018   Procedure: RIGHT/LEFT HEART CATH AND CORONARY/GRAFT ANGIOGRAPHY;  Surgeon: [name], MD;  Location: [place] CV LAB;  Service: Cardiovascular;  Laterality: N/A;   ROTATOR CUFF REPAIR Left 09/1998   TEE WITHOUT CARDIOVERSION N/A 04/06/2014   Procedure: TRANSESOPHAGEAL ECHOCARDIOGRAM (TEE);  Surgeon: [name], MD;  Location[facility] ENDOSCOPY;  Service: Cardiovascular;  Laterality: N/A;  mild focal basa LVH of the septum, ef 50-55%, s/p AV annuloplasty ring, elongated chordae, thickened leaflets, mild to mod. MR, multiple small jets/arotic bioprosthetic valve sits well in position, mild AI/ thickened TV w/ mild reurg./mild LA   TEE WITHOUT CARDIOVERSION N/A 10/22/2018   Procedure: TRANSESOPHAGEAL  ECHOCARDIOGRAM (TEE);  Surgeon: [name], MD;  Location: [facility] ENDOSCOPY;  Service: Cardiovascular;  Laterality: N/A;   TEE WITHOUT CARDIOVERSION N/A 11/25/2018   Procedure: TRANSESOPHAGEAL ECHOCARDIOGRAM (TEE);  Surgeon: [name], MD;  Location: [place];  Service: Open Heart Surgery;  Laterality: N/A;   TRANSCATHETER AORTIC VALVE REPLACEMENT, TRANSFEMORAL N/A 11/25/2018   Procedure: TRANSCATHETER AORTIC VALVE REPLACEMENT, TRANSFEMORAL;  Surgeon: [name], MD;  Location: [place];  Service: Open Heart Surgery;  Laterality: N/A;   TRANSTHORACIC ECHOCARDIOGRAM  03-23-2016   dr Irish Lack   EF 55-60%, reduced contribution atrial contraction to ventricular filling, due to increased ventricular diastolic pressure or atrial contratile dysfunction/  bioprosthetic AV w/ mod. regurg. (valve area 1.97cm^2)/ mild dilated ascending aorta/ mild thicken MV normal function annular ring prosthesis/severe LAE & mod. to sev.RAE/ PASP 78mHg/ mild TR/ ventricle septal motion show paradox   TRANSURETHRAL RESECTION OF BLADDER TUMOR N/A 02/13/2016   Procedure: TRANSURETHRAL RESECTION OF BLADDER TUMOR (TURBT);  Surgeon: [name] MD;  [facility]  Service: Urology;  Laterality: N/A;   TRANSURETHRAL RESECTION OF BLADDER TUMOR N/A 09/04/2016   Procedure: TRANSURETHRAL RESECTION OF BLADDER TUMOR (TURBT);  Surgeon: [name] MD;  [facility]  Service: Urology;  Laterality: N/A;    There were no vitals filed for this visit.   Subjective Assessment - 02/14/21 0900     Subjective No issues, feeling good, a little soreness in the back                               [facility]/Exercise - 02/14/21 0001       Ambulation/Gait   Gait Comments around the back building and then the parking island then through the grass and negotiating over obstacles, gave cues today for increased pace/speed      High Level Balance    High Level Balance Comments airex 8" toe taps,  airex squat reach, on airex sit to stand without hands, then with weighted ball in hand, walking on the thick pad figure 8 obstacles, side stepping over obstacles, stepping over and off onto and off the airex      Knee/Hip Exercises: Aerobic   Recumbent Bike level 4 x 5 minutes    Nustep level 6 x 6 minutes                       PT Short Term Goals - 01/06/21 1032       PT SHORT TERM GOAL #1   Title Pt will be I and compliant with initial HEP.    Status Achieved  PT Long Term Goals - 02/09/21 0940       PT LONG TERM GOAL #1   Title Pt will be I and compliant with HEP.    Status Partially Met                   Plan - 02/14/21 0919     Clinical Impression Statement Did well with the balance today, his biggest issues to day was SOB coming up the hill and then had difficulty with the side stepping over, tended to lose his balance with this, after about 4 attempts he got it and had no issues    PT Next Visit Plan start work on after PT HEP    Consulted and Agree with Plan of Care Patient             Patient will benefit from skilled therapeutic intervention in order to improve the following deficits and impairments:  Abnormal gait, Decreased coordination, Difficulty walking, Cardiopulmonary status limiting activity, Decreased endurance, Decreased activity tolerance, Decreased balance, Postural dysfunction, Decreased strength, Decreased mobility  Visit Diagnosis: Muscle weakness (generalized)  Difficulty in walking, not elsewhere classified  Other lack of coordination  Other abnormalities of gait and mobility     Problem List Patient Active Problem List   Diagnosis Date Noted   Cerebral thrombosis with cerebral infarction 11/25/2020   Acute right-sided weakness 11/24/2020   History of mitral valve repair 11/26/2018   Prosthetic valve dysfunction 11/25/2018   Ectatic  abdominal aorta ([place]) 11/25/2018   S/P valve-in-valve TAVR 11/25/2018   History of bladder cancer    History of pulmonary embolus (PE)    Acute on chronic diastolic heart failure ([place]) 04/23/2018   History of aortic valve replacement 04/23/2018   Atrial fibrillation ([place]) 04/23/2018   Coronary atherosclerosis of native coronary artery 02/25/2014   Hyperlipidemia 02/25/2014   Embolism and thrombosis ([place]) 03/11/2013    Sumner Boast, PT 02/14/2021, 9:21 AM  [place]. [place], [name], [address] Phone: [phone]   Fax:  [phone]  Name: Eric [name] MRN: [id number] Date of Birth: [birth year]

## 2021-02-16 ENCOUNTER — Encounter: Payer: Medicare Other | Admitting: Occupational Therapy

## 2021-02-16 ENCOUNTER — Ambulatory Visit: Payer: Medicare Other | Admitting: Physical Therapy

## 2021-02-16 ENCOUNTER — Encounter: Payer: Self-pay | Admitting: Physical Therapy

## 2021-02-16 ENCOUNTER — Other Ambulatory Visit: Payer: Self-pay

## 2021-02-16 DIAGNOSIS — R2689 Other abnormalities of gait and mobility: Secondary | ICD-10-CM

## 2021-02-16 DIAGNOSIS — R278 Other lack of coordination: Secondary | ICD-10-CM | POA: Diagnosis not present

## 2021-02-16 DIAGNOSIS — R262 Difficulty in walking, not elsewhere classified: Secondary | ICD-10-CM

## 2021-02-16 DIAGNOSIS — M6281 Muscle weakness (generalized): Secondary | ICD-10-CM

## 2021-02-16 NOTE — Therapy (Signed)
[place]. [place], [name], [address] Phone: [phone]   Fax:  [phone] Progress Note Reporting Period 409735 to 02/16/21 for visits 11-20  See note below for Objective Data and Assessment of Progress/Goals.     Physical Therapy Treatment  Patient Details  Name: Eric [name] MRN: [id number] Date of Birth: 02/16/1930 Referring Provider (PT): Debbe Odea   Encounter Date: 02/16/2021   PT End of Session - 02/16/21 0927     Visit Number 20    Date for PT Re-Evaluation 03/09/21    Authorization Type [employer] Medicare    PT Start Time 3419    PT Stop Time 0925    PT Time Calculation (min) 44 min    Activity Tolerance Patient tolerated treatment well    Behavior During Therapy [facility] for tasks assessed/performed             Past Medical History:  Diagnosis Date   Atrial flutter, paroxysmal ([place])    a. dx 04-02-2014, s/p  successful cardioversion 04-06-2014.   Chronic anticoagulation    on Eliquis--  due to recurrent dvt's and pe's   Dyslipidemia    History of bladder cancer urologist-  dr [name]   02-13-2016  s/p TURBT per path high grade papillary urothelial carcinoma   History of DVT (deep vein thrombosis)    2000-- RLE   History of melanoma excision    left flank;  07/ 2014 left lower leg and right upper chest   History of prostate cancer    Gleason 6--  s/p  radical prostatectomy 06/ 2000 in [place]---  no recurrence   History of pulmonary embolus (PE)    2000 & 2005  bilateral   Hx of valvuloplasty 06/19/2007   a. 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 05/2007 CABG x 1: s/p SVG-OM;  b. 10/2010 Ex MV: EF 72%, inf attenuation w/o ischemia, brief  run of PAT with exercise. To   Stroke ([place])    Thrombocytopenia ([place])    Thyroid goiter 2013   nodular   Wears hearing aid    BILATERAL   Wears partial dentures    UPPER    Past Surgical History:  Procedure Laterality Date   AORTIC VALVE REPLACEMENT (AVR)/CORONARY ARTERY BYPASS GRAFTING (CABG)  06-19-2007  dr [name]   SVG to OM1 ;   AVR w/ #23 Mitralflow pericardial and ligation left atrial appendage;  MV repair w/ #28 Sorin 3-D memo Ring Annuloplasty with repair ruptured chordar of P-2 flail segments   CARDIAC CATHETERIZATION  05-12-2007  dr Leonia Reeves   single vessel 30% ostial LAD,  80% LCx;  severe to critial AS,  moderate MR,  normal LVF, ef 70%,  normal right heart pressures and cardiac outputs,  mild elevated LV end-diastolic pressure     CARDIOVASCULAR STRESS TEST  11-02-2010  dr Irish Lack   normal nuclear study w/ no ischemia or infarct/scar/  normal LV function and wall motion , ef 72%   CARDIOVERSION N/A 04/06/2014   Procedure: CARDIOVERSION;  Surgeon: [name], MD;  Location: [facility];  Service: Cardiovascular;  Laterality: N/A;   successful   CATARACT EXTRACTION W/ INTRAOCULAR LENS  IMPLANT, BILATERAL  2012  approx   CORONARY ARTERY BYPASS GRAFT     PROSTATECTOMY     RETROPUBIC RADICAL PROSTATECTOMY  06/ 2000  in [place], [place]   RIGHT/LEFT HEART CATH AND CORONARY/GRAFT ANGIOGRAPHY N/A 10/29/2018   Procedure: RIGHT/LEFT HEART CATH AND CORONARY/GRAFT ANGIOGRAPHY;  Surgeon: [name], MD;  Location: [facility];  Service: Cardiovascular;  Laterality: N/A;   ROTATOR CUFF REPAIR Left 09/1998   TEE WITHOUT CARDIOVERSION N/A 04/06/2014   Procedure: TRANSESOPHAGEAL ECHOCARDIOGRAM (TEE);  Surgeon: [name], MD;  [facility];  Service: Cardiovascular;  Laterality: N/A;  mild focal basa LVH of the septum, ef 50-55%, s/p AV annuloplasty ring, elongated chordae, thickened leaflets, mild to mod. MR, multiple small jets/arotic bioprosthetic valve  sits well in position, mild AI/ thickened TV w/ mild reurg./mild LA   TEE WITHOUT CARDIOVERSION N/A 10/22/2018   Procedure: TRANSESOPHAGEAL ECHOCARDIOGRAM (TEE);  Surgeon: [name], MD;  Location[facility];  Service: Cardiovascular;  Laterality: N/A;   TEE WITHOUT CARDIOVERSION N/A 11/25/2018   Procedure: TRANSESOPHAGEAL ECHOCARDIOGRAM (TEE);  Surgeon: [name], MD;  Location: [place];  Service: Open Heart Surgery;  Laterality: N/A;   TRANSCATHETER AORTIC VALVE REPLACEMENT, TRANSFEMORAL N/A 11/25/2018   Procedure: TRANSCATHETER AORTIC VALVE REPLACEMENT, TRANSFEMORAL;  Surgeon: [name], MD;  Location: [place];  Service: Open Heart Surgery;  Laterality: N/A;   TRANSTHORACIC ECHOCARDIOGRAM  03-23-2016   dr Irish Lack   EF 55-60%, reduced contribution atrial contraction to ventricular filling, due to increased ventricular diastolic pressure or atrial contratile dysfunction/  bioprosthetic AV w/ mod. regurg. (valve area 1.97cm^2)/ mild dilated ascending aorta/ mild thicken MV normal function annular ring prosthesis/severe LAE & mod. to sev.RAE/ PASP 60mHg/ mild TR/ ventricle septal motion show paradox   TRANSURETHRAL RESECTION OF BLADDER TUMOR N/A 02/13/2016   Procedure: TRANSURETHRAL RESECTION OF BLADDER TUMOR (TURBT);  Surgeon: [name] MD;  [facility]  Service: Urology;  Laterality: N/A;   TRANSURETHRAL RESECTION OF BLADDER TUMOR N/A 09/04/2016   Procedure: TRANSURETHRAL RESECTION OF BLADDER TUMOR (TURBT);  Surgeon: [name] MD;  [facility]  Service: Urology;  Laterality: N/A;    There were no vitals filed for this visit.   Subjective Assessment - 02/16/21 0852     Subjective Patient asks about drving and I gave him ideas to go to church parking lot or business after hours and practice with his wife, but if he needed more he would have to inquire about a driving program and a person that does that to  verify.    Currently in Pain? No/denies                               [facility] Adult PT Treatment/Exercise - 02/16/21 0001       Ambulation/Gait   Gait Comments around the back building and then the parking island then through the grass and negotiating over obstacles, gave cues today for increased pace/speed      High Level Balance   High Level Balance Activities Backward walking;Side stepping      Knee/Hip Exercises: Aerobic   Recumbent Bike level 4 x 6 minutes      Knee/Hip Exercises: Machines for Strengthening   Cybex Knee Extension 10# 2x15    Cybex Knee Flexion 25# 2x15    Cybex Leg Press 40# 2x10    Other Machine seated rows 20#, lats 20# 2x10  Knee/Hip Exercises: Seated   Other Seated Knee/Hip Exercises worked on reactions simulating gas and brake while driving                       PT Short Term Goals - 01/06/21 1032       PT SHORT TERM GOAL #1   Title Pt will be I and compliant with initial HEP.    Status Achieved               PT Long Term Goals - 02/16/21 0937       PT LONG TERM GOAL #1   Title Pt will be I and compliant with HEP.    Status Partially Met      PT LONG TERM GOAL #3   Title decrease TUG time to 12 seconds    Status Achieved                   Plan - 02/16/21 0927     Clinical Impression Statement I focused some on strength today, talked with him about safety and the driving, he reports no one told him not to drive just that him and his wife thought it would be good to not drive after the CVA    PT Next Visit Plan will work on the overall function and what to do after PT    Consulted and Agree with Plan of Care Patient             Patient will benefit from skilled therapeutic intervention in order to improve the following deficits and impairments:  Abnormal gait, Decreased coordination, Difficulty walking, Cardiopulmonary status limiting activity, Decreased endurance, Decreased  activity tolerance, Decreased balance, Postural dysfunction, Decreased strength, Decreased mobility  Visit Diagnosis: Muscle weakness (generalized)  Difficulty in walking, not elsewhere classified  Other abnormalities of gait and mobility     Problem List Patient Active Problem List   Diagnosis Date Noted   Cerebral thrombosis with cerebral infarction 11/25/2020   Acute right-sided weakness 11/24/2020   History of mitral valve repair 11/26/2018   Prosthetic valve dysfunction 11/25/2018   Ectatic abdominal aorta ([place]) 11/25/2018   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 [place], [name], [address] Phone: [phone]   Fax:  [phone]  Name: ATILLA ZOLLNER MRN: [id number] Date of Birth: [birth year]

## 2021-02-21 ENCOUNTER — Ambulatory Visit: Payer: Medicare Other | Attending: Internal Medicine | Admitting: Physical Therapy

## 2021-02-21 ENCOUNTER — Encounter: Payer: Self-pay | Admitting: Physical Therapy

## 2021-02-21 ENCOUNTER — Other Ambulatory Visit: Payer: Self-pay

## 2021-02-21 DIAGNOSIS — R2689 Other abnormalities of gait and mobility: Secondary | ICD-10-CM | POA: Insufficient documentation

## 2021-02-21 DIAGNOSIS — R262 Difficulty in walking, not elsewhere classified: Secondary | ICD-10-CM | POA: Diagnosis present

## 2021-02-21 DIAGNOSIS — R278 Other lack of coordination: Secondary | ICD-10-CM | POA: Diagnosis present

## 2021-02-21 DIAGNOSIS — M6281 Muscle weakness (generalized): Secondary | ICD-10-CM | POA: Diagnosis not present

## 2021-02-21 DIAGNOSIS — I69319 Unspecified symptoms and signs involving cognitive functions following cerebral infarction: Secondary | ICD-10-CM | POA: Insufficient documentation

## 2021-02-21 NOTE — Therapy (Signed)
[place]. [place], [name], [address] Phone: [phone]   Fax:  [phone]  Physical Therapy Treatment  Patient Details  Name: Eric [name] MRN: [id number] Date of Birth: 1929-06-02 Referring Provider (PT): Debbe Odea   Encounter Date: 02/21/2021   PT End of Session - 02/21/21 1559     Visit Number 21    Date for PT Re-Evaluation 03/09/21    Authorization Type UHC Medicare    PT Start Time 6834    PT Stop Time 1528    PT Time Calculation (min) 46 min    Activity Tolerance Patient tolerated treatment well    Behavior During Therapy [facility] for tasks assessed/performed             Past Medical History:  Diagnosis Date   Atrial flutter, paroxysmal ([place])    a. dx 04-02-2014, s/p  successful cardioversion 04-06-2014.   Chronic anticoagulation    on Eliquis--  due to recurrent dvt's and pe's   Dyslipidemia    History of bladder cancer urologist-  dr [name]   02-13-2016  s/p TURBT per path high grade papillary urothelial carcinoma   History of DVT (deep vein thrombosis)    2000-- RLE   History of melanoma excision    left flank;  07/ 2014 left lower leg and right upper chest   History of prostate cancer    Gleason 6--  s/p  radical prostatectomy 06/ 2000 in [place]---  no recurrence   History of pulmonary embolus (PE)    2000 & 2005  bilateral   Hx of valvuloplasty 06/19/2007   a. 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 05/2007 CABG x 1: s/p SVG-OM;  b. 10/2010 Ex MV: EF 72%, inf attenuation w/o ischemia, brief run of PAT with exercise. To   Stroke ([place])    Thrombocytopenia ([place])    Thyroid goiter 2013   nodular   Wears hearing aid    BILATERAL    Wears partial dentures    UPPER    Past Surgical History:  Procedure Laterality Date   AORTIC VALVE REPLACEMENT (AVR)/CORONARY ARTERY BYPASS GRAFTING (CABG)  06-19-2007  dr [name]   SVG to OM1 ;   AVR w/ #23 Mitralflow pericardial and ligation left atrial appendage;  MV repair w/ #28 Sorin 3-D memo Ring Annuloplasty with repair ruptured chordar of P-2 flail segments   CARDIAC CATHETERIZATION  05-12-2007  dr Leonia Reeves   single vessel 30% ostial LAD,  80% LCx;  severe to critial AS,  moderate MR,  normal LVF, ef 70%,  normal right heart pressures and cardiac outputs,  mild elevated LV end-diastolic pressure     CARDIOVASCULAR STRESS TEST  11-02-2010  dr Irish Lack   normal nuclear study w/ no ischemia or infarct/scar/  normal LV function and wall motion , ef 72%   CARDIOVERSION N/A 04/06/2014   Procedure: CARDIOVERSION;  Surgeon: [name], MD;  Location: [place];  Service: Cardiovascular;  Laterality: N/A;   successful   CATARACT EXTRACTION W/ INTRAOCULAR LENS  IMPLANT, BILATERAL  2012  approx   CORONARY ARTERY BYPASS GRAFT     PROSTATECTOMY     RETROPUBIC RADICAL PROSTATECTOMY  06/ 2000  in [place]   RIGHT/LEFT HEART CATH AND CORONARY/GRAFT ANGIOGRAPHY N/A 10/29/2018   Procedure: RIGHT/LEFT HEART CATH AND CORONARY/GRAFT ANGIOGRAPHY;  Surgeon: Cooper, Michael, MD;  [facility];  Service: Cardiovascular;  Laterality: N/A;   ROTATOR CUFF REPAIR Left 09/1998   TEE WITHOUT CARDIOVERSION N/A 04/06/2014   Procedure: TRANSESOPHAGEAL ECHOCARDIOGRAM (TEE);  Surgeon: Katarina H Nelson, MD;  Location: [facility];  Service: Cardiovascular;  Laterality: N/A;  mild focal basa LVH of the septum, ef 50-55%, s/p AV annuloplasty ring, elongated chordae, thickened leaflets, mild to mod. MR, multiple small jets/arotic bioprosthetic valve sits well in position, mild AI/ thickened TV w/ mild reurg./mild LA   TEE WITHOUT CARDIOVERSION N/A 10/22/2018   Procedure: TRANSESOPHAGEAL  ECHOCARDIOGRAM (TEE);  Surgeon: Skains, Mark C, MD;  Location: [facility];  Service: Cardiovascular;  Laterality: N/A;   TEE WITHOUT CARDIOVERSION N/A 11/25/2018   Procedure: TRANSESOPHAGEAL ECHOCARDIOGRAM (TEE);  Surgeon: Cooper, Michael, MD;  [facility];  Service: Open Heart Surgery;  Laterality: N/A;   TRANSCATHETER AORTIC VALVE REPLACEMENT, TRANSFEMORAL N/A 11/25/2018   Procedure: TRANSCATHETER AORTIC VALVE REPLACEMENT, TRANSFEMORAL;  Surgeon: Cooper, Michael, MD;  [facility];  Service: Open Heart Surgery;  Laterality: N/A;   TRANSTHORACIC ECHOCARDIOGRAM  03-23-2016   dr varanasi   EF 55-60%, reduced contribution atrial contraction to ventricular filling, due to increased ventricular diastolic pressure or atrial contratile dysfunction/  bioprosthetic AV w/ mod. regurg. (valve area 1.97cm^2)/ mild dilated ascending aorta/ mild thicken MV normal function annular ring prosthesis/severe LAE & mod. to sev.RAE/ PASP 41mmHg/ mild TR/ ventricle septal motion show paradox   TRANSURETHRAL RESECTION OF BLADDER TUMOR N/A 02/13/2016   Procedure: TRANSURETHRAL RESECTION OF BLADDER TUMOR (TURBT);  Surgeon: Brian James Budzyn, MD;  Location: [facility];  Service: Urology;  Laterality: N/A;   TRANSURETHRAL RESECTION OF BLADDER TUMOR N/A 09/04/2016   Procedure: TRANSURETHRAL RESECTION OF BLADDER TUMOR (TURBT);  Surgeon: Brian James Budzyn, MD;  Location: [facility];  Service: Urology;  Laterality: N/A;    There were no vitals filed for this visit.   Subjective Assessment - 02/21/21 1450     Subjective I did some moving of boxes yesterday, I was a little sore and tired.  no other issues noted    Currently in Pain? No/denies                               [facility] Adult PT Treatment/Exercise - 02/21/21 0001       Ambulation/Gait   Gait Comments around the back building and then the parking island then through the grass and negotiating over  obstacles, gave cues today for increased pace/speed      High Level Balance   High Level Balance Comments on airex ball toss, sit to stand no hands feet on airex, airex balance beam tandem and side stepping      Knee/Hip Exercises: Aerobic   Recumbent Bike level 6 x 5 minutes      Knee/Hip Exercises: Machines for Strengthening   Cybex Knee Extension 10# 2x15    Cybex Knee Flexion 25# 2x15    Other Machine seated rows 25#, lats 25# 2x10      Knee/Hip Exercises: Standing   Walking with Sports Cord 40$ all directions 4x each                       PT Short Term Goals - 01/06/21   1032       PT SHORT TERM GOAL #1   Title Pt will be I and compliant with initial HEP.    Status Achieved               PT Long Term Goals - 02/16/21 0937       PT LONG TERM GOAL #1   Title Pt will be I and compliant with HEP.    Status Partially Met      PT LONG TERM GOAL #3   Title decrease TUG time to 12 seconds    Status Achieved                   Plan - 02/21/21 1559     Clinical Impression Statement Patient really struggled getting up from sitting while standing on the airex, tends to be back on heels and falls back into sitting, cues for nose over toes seemed to help and once he got this he was able to do without issue.    PT Next Visit Plan will work on the overall function and what to do after PT    Consulted and Agree with Plan of Care Patient             Patient will benefit from skilled therapeutic intervention in order to improve the following deficits and impairments:  Abnormal gait, Decreased coordination, Difficulty walking, Cardiopulmonary status limiting activity, Decreased endurance, Decreased activity tolerance, Decreased balance, Postural dysfunction, Decreased strength, Decreased mobility  Visit Diagnosis: Muscle weakness (generalized)  Difficulty in walking, not elsewhere classified  Other abnormalities of gait and mobility  Other lack of  coordination  Unspecified symptoms and signs involving cognitive functions following cerebral infarction     Problem List Patient Active Problem List   Diagnosis Date Noted   Cerebral thrombosis with cerebral infarction 11/25/2020   Acute right-sided weakness 11/24/2020   History of mitral valve repair 11/26/2018   Prosthetic valve dysfunction 11/25/2018   Ectatic abdominal aorta ([facility]) 11/25/2018   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 [place], [place], [address] Phone: [phone]   Fax:  [phone]  Name: Eric [name] MRN: [id number] Date of Birth: 12/19/1929    

## 2021-02-23 ENCOUNTER — Other Ambulatory Visit: Payer: Self-pay

## 2021-02-23 ENCOUNTER — Ambulatory Visit: Payer: Medicare Other | Admitting: Physical Therapy

## 2021-02-23 ENCOUNTER — Encounter: Payer: Self-pay | Admitting: Physical Therapy

## 2021-02-23 DIAGNOSIS — R262 Difficulty in walking, not elsewhere classified: Secondary | ICD-10-CM

## 2021-02-23 DIAGNOSIS — M6281 Muscle weakness (generalized): Secondary | ICD-10-CM | POA: Diagnosis not present

## 2021-02-23 DIAGNOSIS — R2689 Other abnormalities of gait and mobility: Secondary | ICD-10-CM

## 2021-02-23 NOTE — Therapy (Signed)
[place] [place], [name], [address] Phone: [phone]   Fax:  [phone]  Physical Therapy Treatment  Patient Details  Name: Eric [name] MRN: [id number] Date of Birth: June 25, 1929 Referring Provider (PT): Debbe Odea   Encounter Date: 02/23/2021   PT End of Session - 02/23/21 1338     Visit Number 22    Date for PT Re-Evaluation 03/09/21    Authorization Type UHC Medicare    PT Start Time 1300    PT Stop Time 1344    PT Time Calculation (min) 44 min    Activity Tolerance Patient tolerated treatment well    Behavior During Therapy [facility] for tasks assessed/performed             Past Medical History:  Diagnosis Date   Atrial flutter, paroxysmal ([place])    a. dx 04-02-2014, s/p  successful cardioversion 04-06-2014.   Chronic anticoagulation    on Eliquis--  due to recurrent dvt's and pe's   Dyslipidemia    History of bladder cancer urologist-  dr [name]   02-13-2016  s/p TURBT per path high grade papillary urothelial carcinoma   History of DVT (deep vein thrombosis)    2000-- RLE   History of melanoma excision    left flank;  07/ 2014 left lower leg and right upper chest   History of prostate cancer    Gleason 6--  s/p  radical prostatectomy 06/ 2000 in [place]---  no recurrence   History of pulmonary embolus (PE)    2000 & 2005  bilateral   Hx of valvuloplasty 06/19/2007   a. 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 05/2007 CABG x 1: s/p SVG-OM;  b. 10/2010 Ex MV: EF 72%, inf attenuation w/o ischemia, brief run of PAT with exercise. To   Stroke ([place])    Thrombocytopenia ([place])    Thyroid goiter 2013   nodular   Wears hearing aid    BILATERAL    Wears partial dentures    UPPER    Past Surgical History:  Procedure Laterality Date   AORTIC VALVE REPLACEMENT (AVR)/CORONARY ARTERY BYPASS GRAFTING (CABG)  06-19-2007  dr [name]   SVG to OM1 ;   AVR w/ #23 Mitralflow pericardial and ligation left atrial appendage;  MV repair w/ #28 Sorin 3-D memo Ring Annuloplasty with repair ruptured chordar of P-2 flail segments   CARDIAC CATHETERIZATION  05-12-2007  dr Leonia Reeves   single vessel 30% ostial LAD,  80% LCx;  severe to critial AS,  moderate MR,  normal LVF, ef 70%,  normal right heart pressures and cardiac outputs,  mild elevated LV end-diastolic pressure     CARDIOVASCULAR STRESS TEST  11-02-2010  dr Irish Lack   normal nuclear study w/ no ischemia or infarct/scar/  normal LV function and wall motion , ef 72%   CARDIOVERSION N/A 04/06/2014   Procedure: CARDIOVERSION;  Surgeon: [name], MD;  Location: [place];  Service: Cardiovascular;  Laterality: N/A;   successful   CATARACT EXTRACTION W/ INTRAOCULAR LENS  IMPLANT, BILATERAL  2012  approx   CORONARY ARTERY BYPASS GRAFT     PROSTATECTOMY     RETROPUBIC RADICAL PROSTATECTOMY  06/ 2000  in [place], [place]   RIGHT/LEFT HEART CATH AND CORONARY/GRAFT ANGIOGRAPHY N/A 10/29/2018   Procedure: RIGHT/LEFT HEART CATH AND CORONARY/GRAFT ANGIOGRAPHY;  Surgeon: [name], MD;  Location: [place] CV LAB;  Service: Cardiovascular;  Laterality: N/A;   ROTATOR CUFF REPAIR Left 09/1998   TEE WITHOUT CARDIOVERSION N/A 04/06/2014   Procedure: TRANSESOPHAGEAL ECHOCARDIOGRAM (TEE);  Surgeon: [name], MD;  Location: [facility] ENDOSCOPY;  Service: Cardiovascular;  Laterality: N/A;  mild focal basa LVH of the septum, ef 50-55%, s/p AV annuloplasty ring, elongated chordae, thickened leaflets, mild to mod. MR, multiple small jets/arotic bioprosthetic valve sits well in position, mild AI/ thickened TV w/ mild reurg./mild LA   TEE WITHOUT CARDIOVERSION N/A 10/22/2018   Procedure: TRANSESOPHAGEAL  ECHOCARDIOGRAM (TEE);  Surgeon: [name], MD;  Location[facility] ENDOSCOPY;  Service: Cardiovascular;  Laterality: N/A;   TEE WITHOUT CARDIOVERSION N/A 11/25/2018   Procedure: TRANSESOPHAGEAL ECHOCARDIOGRAM (TEE);  Surgeon: [name], MD;  Location: [place];  Service: Open Heart Surgery;  Laterality: N/A;   TRANSCATHETER AORTIC VALVE REPLACEMENT, TRANSFEMORAL N/A 11/25/2018   Procedure: TRANSCATHETER AORTIC VALVE REPLACEMENT, TRANSFEMORAL;  Surgeon: [name], MD;  Location: [place];  Service: Open Heart Surgery;  Laterality: N/A;   TRANSTHORACIC ECHOCARDIOGRAM  03-23-2016   dr Irish Lack   EF 55-60%, reduced contribution atrial contraction to ventricular filling, due to increased ventricular diastolic pressure or atrial contratile dysfunction/  bioprosthetic AV w/ mod. regurg. (valve area 1.97cm^2)/ mild dilated ascending aorta/ mild thicken MV normal function annular ring prosthesis/severe LAE & mod. to sev.RAE/ PASP 106mHg/ mild TR/ ventricle septal motion show paradox   TRANSURETHRAL RESECTION OF BLADDER TUMOR N/A 02/13/2016   Procedure: TRANSURETHRAL RESECTION OF BLADDER TUMOR (TURBT);  Surgeon: [name] MD;  [facility]  Service: Urology;  Laterality: N/A;   TRANSURETHRAL RESECTION OF BLADDER TUMOR N/A 09/04/2016   Procedure: TRANSURETHRAL RESECTION OF BLADDER TUMOR (TURBT);  Surgeon: [name] MD;  [facility]  Service: Urology;  Laterality: N/A;    There were no vitals filed for this visit.   Subjective Assessment - 02/23/21 1302     Subjective Things ave been going well    Currently in Pain? No/denies                               [facility] - 02/23/21 0001       Ambulation/Gait   Stairs Yes    Stairs Assistance 6: Modified independent (Device/Increase time)    Stair Management Technique One rail Right;Alternating pattern    Number of Stairs 36    Height of  Stairs 6    Gait Comments to back building, two flights of stairs, back down up hill on grass.      High Level Balance   High Level Balance Comments On airex circles x10 each side two sets      Knee/Hip Exercises: Aerobic   Recumbent Bike level 6 x 5 minutes      Knee/Hip Exercises: Machines for Strengthening   Cybex Knee Extension 10# 2x15    Cybex Knee Flexion 35lb 2x10    Other Machine seated rows 25#, lats 25# 2x10      Knee/Hip Exercises: Standing   Lateral Step Up Both;1 set;Hand Hold: 0;5 reps;Step Height: 8"   from airex   Forward Step Up Both;2 sets;5 reps;Hand Hold: 0;Step Height: 8"   from airex  PT Short Term Goals - 01/06/21 1032       PT SHORT TERM GOAL #1   Title Pt will be I and compliant with initial HEP.    Status Achieved               PT Long Term Goals - 02/16/21 0937       PT LONG TERM GOAL #1   Title Pt will be I and compliant with HEP.    Status Partially Met      PT LONG TERM GOAL #3   Title decrease TUG time to 12 seconds    Status Achieved                   Plan - 02/23/21 1338     Clinical Impression Statement Session continues to focus on pt's balance on non compliant surfaces. Added uphill ambulation on grass, some difficulty on the steepest part of heel, pt tends to carry most of his weight on the back of his heels. Pt continues to have difficulty when stepping back off of box onto airex. Multiple LOB requiring min to mod assist to recover when turning circle on airex.    Stability/Clinical Decision Making Evolving/Moderate complexity    Rehab Potential Good    PT Duration 12 weeks    PT Treatment/Interventions ADLs/Self Care Home Management;Gait training;Neuromuscular re-education;Balance training;Therapeutic exercise;Therapeutic activities;Functional mobility training;Stair training;Patient/family education;Manual techniques    PT Next Visit Plan will work on the overall function and what  to do after PT             Patient will benefit from skilled therapeutic intervention in order to improve the following deficits and impairments:  Abnormal gait, Decreased coordination, Difficulty walking, Cardiopulmonary status limiting activity, Decreased endurance, Decreased activity tolerance, Decreased balance, Postural dysfunction, Decreased strength, Decreased mobility  Visit Diagnosis: Other abnormalities of gait and mobility  Difficulty in walking, not elsewhere classified  Muscle weakness (generalized)     Problem List Patient Active Problem List   Diagnosis Date Noted   Cerebral thrombosis with cerebral infarction 11/25/2020   Acute right-sided weakness 11/24/2020   History of mitral valve repair 11/26/2018   Prosthetic valve dysfunction 11/25/2018   Ectatic abdominal aorta ([place]) 11/25/2018   S/P valve-in-valve TAVR 11/25/2018   History of bladder cancer    History of pulmonary embolus (PE)    Acute on chronic diastolic heart failure ([place]) 04/23/2018   History of aortic valve replacement 04/23/2018   Atrial fibrillation ([place]) 04/23/2018   Coronary atherosclerosis of native coronary artery 02/25/2014   Hyperlipidemia 02/25/2014   Embolism and thrombosis ([place]) 03/11/2013    Scot Jun, PTA 02/23/2021, 1:43 PM  [place] [place], [name], [address] Phone: [phone]   Fax:  [phone]  Name: Eric [name] MRN: [id number] Date of Birth: 08-12-1929

## 2021-03-01 ENCOUNTER — Ambulatory Visit: Payer: Medicare Other | Admitting: Physical Therapy

## 2021-03-07 ENCOUNTER — Ambulatory Visit: Payer: Medicare Other | Admitting: Physical Therapy

## 2021-03-07 ENCOUNTER — Other Ambulatory Visit: Payer: Self-pay

## 2021-03-07 ENCOUNTER — Encounter: Payer: Self-pay | Admitting: Physical Therapy

## 2021-03-07 DIAGNOSIS — I69319 Unspecified symptoms and signs involving cognitive functions following cerebral infarction: Secondary | ICD-10-CM

## 2021-03-07 DIAGNOSIS — R278 Other lack of coordination: Secondary | ICD-10-CM

## 2021-03-07 DIAGNOSIS — R2689 Other abnormalities of gait and mobility: Secondary | ICD-10-CM

## 2021-03-07 DIAGNOSIS — M6281 Muscle weakness (generalized): Secondary | ICD-10-CM | POA: Diagnosis not present

## 2021-03-07 DIAGNOSIS — R262 Difficulty in walking, not elsewhere classified: Secondary | ICD-10-CM

## 2021-03-07 NOTE — Therapy (Signed)
[place]. [place], [name], [address] Phone: [phone]   Fax:  [phone]  Physical Therapy Treatment  Patient Details  Name: Eric [name] MRN: [id number] Date of Birth: [birth year] Referring Provider (PT): Debbe Odea   Encounter Date: 03/07/2021   PT End of Session - 03/07/21 0931     Visit Number 23    Date for PT Re-Evaluation 03/09/21    Authorization Type UHC Medicare    PT Start Time 0840    PT Stop Time 0925    PT Time Calculation (min) 45 min    Activity Tolerance Patient tolerated treatment well    Behavior During Therapy [facility] for tasks assessed/performed             Past Medical History:  Diagnosis Date   Atrial flutter, paroxysmal ([place])    a. dx 04-02-2014, s/p  successful cardioversion 04-06-2014.   Chronic anticoagulation    on Eliquis--  due to recurrent dvt's and pe's   Dyslipidemia    History of bladder cancer urologist-  dr [name]   02-13-2016  s/p TURBT per path high grade papillary urothelial carcinoma   History of DVT (deep vein thrombosis)    2000-- RLE   History of melanoma excision    left flank;  07/ 2014 left lower leg and right upper chest   History of prostate cancer    Gleason 6--  s/p  radical prostatectomy 06/ 2000 in [place]---  no recurrence   History of pulmonary embolus (PE)    2000 & 2005  bilateral   Hx of valvuloplasty 06/19/2007   a. 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 05/2007 CABG x 1: s/p SVG-OM;  b. 10/2010 Ex MV: EF 72%, inf attenuation w/o ischemia, brief run of PAT with exercise. To   Stroke ([place])    Thrombocytopenia ([place])    Thyroid goiter 2013   nodular   Wears hearing aid    BILATERAL    Wears partial dentures    UPPER    Past Surgical History:  Procedure Laterality Date   AORTIC VALVE REPLACEMENT (AVR)/CORONARY ARTERY BYPASS GRAFTING (CABG)  06-19-2007  dr [name]   SVG to OM1 ;   AVR w/ #23 Mitralflow pericardial and ligation left atrial appendage;  MV repair w/ #28 Sorin 3-D memo Ring Annuloplasty with repair ruptured chordar of P-2 flail segments   CARDIAC CATHETERIZATION  05-12-2007  dr Leonia Reeves   single vessel 30% ostial LAD,  80% LCx;  severe to critial AS,  moderate MR,  normal LVF, ef 70%,  normal right heart pressures and cardiac outputs,  mild elevated LV end-diastolic pressure     CARDIOVASCULAR STRESS TEST  11-02-2010  dr Irish Lack   normal nuclear study w/ no ischemia or infarct/scar/  normal LV function and wall motion , ef 72%   CARDIOVERSION N/A 04/06/2014   Procedure: CARDIOVERSION;  Surgeon: [name], MD;  Location: [place];  Service: Cardiovascular;  Laterality: N/A;   successful   CATARACT EXTRACTION W/ INTRAOCULAR LENS  IMPLANT, BILATERAL  2012  approx   CORONARY ARTERY BYPASS GRAFT     PROSTATECTOMY     RETROPUBIC RADICAL PROSTATECTOMY  06/ 2000  in [place], [place]   RIGHT/LEFT HEART CATH AND CORONARY/GRAFT ANGIOGRAPHY N/A 10/29/2018   Procedure: RIGHT/LEFT HEART CATH AND CORONARY/GRAFT ANGIOGRAPHY;  Surgeon: [name], MD;  Location: [place] CV LAB;  Service: Cardiovascular;  Laterality: N/A;   ROTATOR CUFF REPAIR Left 09/1998   TEE WITHOUT CARDIOVERSION N/A 04/06/2014   Procedure: TRANSESOPHAGEAL ECHOCARDIOGRAM (TEE);  Surgeon: [name], MD;  Location: [facility] ENDOSCOPY;  Service: Cardiovascular;  Laterality: N/A;  mild focal basa LVH of the septum, ef 50-55%, s/p AV annuloplasty ring, elongated chordae, thickened leaflets, mild to mod. MR, multiple small jets/arotic bioprosthetic valve sits well in position, mild AI/ thickened TV w/ mild reurg./mild LA   TEE WITHOUT CARDIOVERSION N/A 10/22/2018   Procedure: TRANSESOPHAGEAL  ECHOCARDIOGRAM (TEE);  Surgeon: [name], MD;  Location: [facility] ENDOSCOPY;  Service: Cardiovascular;  Laterality: N/A;   TEE WITHOUT CARDIOVERSION N/A 11/25/2018   Procedure: TRANSESOPHAGEAL ECHOCARDIOGRAM (TEE);  Surgeon: [name], MD;  Location: [place];  Service: Open Heart Surgery;  Laterality: N/A;   TRANSCATHETER AORTIC VALVE REPLACEMENT, TRANSFEMORAL N/A 11/25/2018   Procedure: TRANSCATHETER AORTIC VALVE REPLACEMENT, TRANSFEMORAL;  Surgeon: [name], MD;  Location: [place];  Service: Open Heart Surgery;  Laterality: N/A;   TRANSTHORACIC ECHOCARDIOGRAM  03-23-2016   dr Irish Lack   EF 55-60%, reduced contribution atrial contraction to ventricular filling, due to increased ventricular diastolic pressure or atrial contratile dysfunction/  bioprosthetic AV w/ mod. regurg. (valve area 1.97cm^2)/ mild dilated ascending aorta/ mild thicken MV normal function annular ring prosthesis/severe LAE & mod. to sev.RAE/ PASP 103mHg/ mild TR/ ventricle septal motion show paradox   TRANSURETHRAL RESECTION OF BLADDER TUMOR N/A 02/13/2016   Procedure: TRANSURETHRAL RESECTION OF BLADDER TUMOR (TURBT);  Surgeon: [name] MD;  [facility]  Service: Urology;  Laterality: N/A;   TRANSURETHRAL RESECTION OF BLADDER TUMOR N/A 09/04/2016   Procedure: TRANSURETHRAL RESECTION OF BLADDER TUMOR (TURBT);  Surgeon: [name] MD;  [facility]  Service: Urology;  Laterality: N/A;    There were no vitals filed for this visit.   Subjective Assessment - 03/07/21 0845     Subjective I had to cancel an appointment last week, I hit my head on a light fixture whie going under it   "I had a goose egg and some bleeding so I cancelled,"  denies any pain or a fall    Currently in Pain? No/denies                               [facility] Adult PT Treatment/Exercise - 03/07/21 0001       Ambulation/Gait   Gait Comments around the  buioilding in the back, cues for pace and when tired to pick up feet      High Level Balance   High Level Balance Comments on airex eyes closed, then eyes open head turns walking ball toss      Knee/Hip Exercises: Aerobic   Recumbent Bike level 6 x 5 minutes      Knee/Hip Exercises: Machines for Strengthening   Cybex Knee Extension 10# 2x15    Cybex Knee Flexion 35lb 2x10    Hip Cybex 5# hip abduction and extension    Other Machine straight arm pulls 10# 2x10, then resisted gait 40# all directions      Knee/Hip Exercises: Standing   Heel Raises Both;20 reps    Heel Raises Limitations als did toe raises in  standing, very poor DF especially left      Knee/Hip Exercises: Seated   Other Seated Knee/Hip Exercises ankle DF with foot down and then with it elevated uses HS when down                       PT Short Term Goals - 01/06/21 1032       PT SHORT TERM GOAL #1   Title Pt will be I and compliant with initial HEP.    Status Achieved               PT Long Term Goals - 02/16/21 0937       PT LONG TERM GOAL #1   Title Pt will be I and compliant with HEP.    Status Partially Met      PT LONG TERM GOAL #3   Title decrease TUG time to 12 seconds    Status Achieved                   Plan - 03/07/21 0931     Clinical Impression Statement I tried the hip exerciss and this was tiring, still a lot of difficulty with airex standing, I did find that while standing he cannot DF, when sitting he uses the Hs to do this, if I put the leg out straiught while sitting he can do it, I asked that he add this to the HEP to help the DF on the left    PT Next Visit Plan will work on the overall function and what to do after PT    Consulted and Agree with Plan of Care Patient             Patient will benefit from skilled therapeutic intervention in order to improve the following deficits and impairments:  Abnormal gait, Decreased coordination, Difficulty  walking, Cardiopulmonary status limiting activity, Decreased endurance, Decreased activity tolerance, Decreased balance, Postural dysfunction, Decreased strength, Decreased mobility  Visit Diagnosis: Other abnormalities of gait and mobility  Difficulty in walking, not elsewhere classified  Muscle weakness (generalized)  Other lack of coordination  Unspecified symptoms and signs involving cognitive functions following cerebral infarction     Problem List Patient Active Problem List   Diagnosis Date Noted   Cerebral thrombosis with cerebral infarction 11/25/2020   Acute right-sided weakness 11/24/2020   History of mitral valve repair 11/26/2018   Prosthetic valve dysfunction 11/25/2018   Ectatic abdominal aorta ([place]) 11/25/2018   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 [place], [name], [address] Phone: [phone]   Fax:  [phone]  Name: Eric [name] MRN: [id number] Date of Birth: Mar 25, 1930

## 2021-03-09 ENCOUNTER — Ambulatory Visit: Payer: Medicare Other | Admitting: Physical Therapy

## 2021-03-09 ENCOUNTER — Other Ambulatory Visit: Payer: Self-pay

## 2021-03-09 ENCOUNTER — Encounter: Payer: Self-pay | Admitting: Physical Therapy

## 2021-03-09 ENCOUNTER — Other Ambulatory Visit: Payer: Self-pay | Admitting: Interventional Cardiology

## 2021-03-09 DIAGNOSIS — R278 Other lack of coordination: Secondary | ICD-10-CM

## 2021-03-09 DIAGNOSIS — M6281 Muscle weakness (generalized): Secondary | ICD-10-CM | POA: Diagnosis not present

## 2021-03-09 DIAGNOSIS — R2689 Other abnormalities of gait and mobility: Secondary | ICD-10-CM

## 2021-03-09 DIAGNOSIS — R262 Difficulty in walking, not elsewhere classified: Secondary | ICD-10-CM

## 2021-03-09 NOTE — Therapy (Signed)
[place]. [place], [name], [address] Phone: [phone]   Fax:  [phone]  Physical Therapy Treatment  Patient Details  Name: Eric [name] MRN: [id number] Date of Birth: 1930/03/01 Referring Provider (PT): Debbe Odea   Encounter Date: 03/09/2021   PT End of Session - 03/09/21 1525     Visit Number 24    Date for PT Re-Evaluation 04/13/21    Authorization Type UHC Medicare    PT Start Time 5852    PT Stop Time 1528    PT Time Calculation (min) 44 min    Activity Tolerance Patient tolerated treatment well    Behavior During Therapy [facility] for tasks assessed/performed             Past Medical History:  Diagnosis Date   Atrial flutter, paroxysmal ([place])    a. dx 04-02-2014, s/p  successful cardioversion 04-06-2014.   Chronic anticoagulation    on Eliquis--  due to recurrent dvt's and pe's   Dyslipidemia    History of bladder cancer urologist-  dr [name]   02-13-2016  s/p TURBT per path high grade papillary urothelial carcinoma   History of DVT (deep vein thrombosis)    2000-- RLE   History of melanoma excision    left flank;  07/ 2014 left lower leg and right upper chest   History of prostate cancer    Gleason 6--  s/p  radical prostatectomy 06/ 2000 in [place]---  no recurrence   History of pulmonary embolus (PE)    2000 & 2005  bilateral   Hx of valvuloplasty 06/19/2007   a. 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 05/2007 CABG x 1: s/p SVG-OM;  b. 10/2010 Ex MV: EF 72%, inf attenuation w/o ischemia, brief run of PAT with exercise. To   Stroke ([place])    Thrombocytopenia ([place])    Thyroid goiter 2013   nodular   Wears hearing aid    BILATERAL    Wears partial dentures    UPPER    Past Surgical History:  Procedure Laterality Date   AORTIC VALVE REPLACEMENT (AVR)/CORONARY ARTERY BYPASS GRAFTING (CABG)  06-19-2007  dr [name]   SVG to OM1 ;   AVR w/ #23 Mitralflow pericardial and ligation left atrial appendage;  MV repair w/ #28 Sorin 3-D memo Ring Annuloplasty with repair ruptured chordar of P-2 flail segments   CARDIAC CATHETERIZATION  05-12-2007  dr Leonia Reeves   single vessel 30% ostial LAD,  80% LCx;  severe to critial AS,  moderate MR,  normal LVF, ef 70%,  normal right heart pressures and cardiac outputs,  mild elevated LV end-diastolic pressure     CARDIOVASCULAR STRESS TEST  11-02-2010  dr Irish Lack   normal nuclear study w/ no ischemia or infarct/scar/  normal LV function and wall motion , ef 72%   CARDIOVERSION N/A 04/06/2014   Procedure: CARDIOVERSION;  Surgeon: [name], MD;  Location: [place];  Service: Cardiovascular;  Laterality: N/A;   successful   CATARACT EXTRACTION W/ INTRAOCULAR LENS  IMPLANT, BILATERAL  2012  approx   CORONARY ARTERY BYPASS GRAFT     PROSTATECTOMY     RETROPUBIC RADICAL PROSTATECTOMY  06/ 2000  in [place]   RIGHT/LEFT HEART CATH AND CORONARY/GRAFT ANGIOGRAPHY N/A 10/29/2018   Procedure: RIGHT/LEFT HEART CATH AND CORONARY/GRAFT ANGIOGRAPHY;  Surgeon: [name], MD;  Location: [place] CV LAB;  Service: Cardiovascular;  Laterality: N/A;   ROTATOR CUFF REPAIR Left 09/1998   TEE WITHOUT CARDIOVERSION N/A 04/06/2014   Procedure: TRANSESOPHAGEAL ECHOCARDIOGRAM (TEE);  Surgeon: [name], MD;  Location: [facility] ENDOSCOPY;  Service: Cardiovascular;  Laterality: N/A;  mild focal basa LVH of the septum, ef 50-55%, s/p AV annuloplasty ring, elongated chordae, thickened leaflets, mild to mod. MR, multiple small jets/arotic bioprosthetic valve sits well in position, mild AI/ thickened TV w/ mild reurg./mild LA   TEE WITHOUT CARDIOVERSION N/A 10/22/2018   Procedure: TRANSESOPHAGEAL  ECHOCARDIOGRAM (TEE);  Surgeon: [name], MD;  Location: [facility] ENDOSCOPY;  Service: Cardiovascular;  Laterality: N/A;   TEE WITHOUT CARDIOVERSION N/A 11/25/2018   Procedure: TRANSESOPHAGEAL ECHOCARDIOGRAM (TEE);  Surgeon: [name], MD;  Location: [place];  Service: Open Heart Surgery;  Laterality: N/A;   TRANSCATHETER AORTIC VALVE REPLACEMENT, TRANSFEMORAL N/A 11/25/2018   Procedure: TRANSCATHETER AORTIC VALVE REPLACEMENT, TRANSFEMORAL;  Surgeon: [name], MD;  Location: [place];  Service: Open Heart Surgery;  Laterality: N/A;   TRANSTHORACIC ECHOCARDIOGRAM  03-23-2016   dr Irish Lack   EF 55-60%, reduced contribution atrial contraction to ventricular filling, due to increased ventricular diastolic pressure or atrial contratile dysfunction/  bioprosthetic AV w/ mod. regurg. (valve area 1.97cm^2)/ mild dilated ascending aorta/ mild thicken MV normal function annular ring prosthesis/severe LAE & mod. to sev.RAE/ PASP 80mHg/ mild TR/ ventricle septal motion show paradox   TRANSURETHRAL RESECTION OF BLADDER TUMOR N/A 02/13/2016   Procedure: TRANSURETHRAL RESECTION OF BLADDER TUMOR (TURBT);  Surgeon: [name] MD;  [facility]  Service: Urology;  Laterality: N/A;   TRANSURETHRAL RESECTION OF BLADDER TUMOR N/A 09/04/2016   Procedure: TRANSURETHRAL RESECTION OF BLADDER TUMOR (TURBT);  Surgeon: [name] MD;  [facility]  Service: Urology;  Laterality: N/A;    There were no vitals filed for this visit.   Subjective Assessment - 03/09/21 1509     Subjective Feeling pretty good                               [facility] Adult PT Treatment/Exercise - 03/09/21 0001       Ambulation/Gait   Gait Comments gait outside around the back building I noted much less hard foot slap today going down the hill, then in the grass and negotiating curbs      High Level Balance   High Level Balance Comments on the large  dense foam cushion figure 8's, ball toss, picking up cones and the 4" toe touches      Knee/Hip Exercises: Machines for Strengthening   Cybex Knee Extension 10# 2x15    Cybex Knee Flexion 35lb 2x15    Hip Cybex 5# hip abduction and extension 2x10    Other Machine straight arm pulls 10# 2x10, then resisted gait 40# all directions                       PT Short Term Goals - 01/06/21 1032       PT SHORT TERM GOAL #1   Title Pt will be I and compliant with initial HEP.    Status Achieved  PT Long Term Goals - 03/09/21 1528       PT LONG TERM GOAL #1   Title Pt will be I and compliant with HEP.    Status Partially Met                   Plan - 03/09/21 1527     Clinical Impression Statement Less foot slap going down the hill.  still weak in the ankles, Gave HEP for this.  Difficulty with him on this may need to go over this next week, much less issu on the dynamic surfaces    PT Next Visit Plan will work on the overall function and what to do after PT    Consulted and Agree with Plan of Care Patient             Patient will benefit from skilled therapeutic intervention in order to improve the following deficits and impairments:  Abnormal gait, Decreased coordination, Difficulty walking, Cardiopulmonary status limiting activity, Decreased endurance, Decreased activity tolerance, Decreased balance, Postural dysfunction, Decreased strength, Decreased mobility  Visit Diagnosis: Other abnormalities of gait and mobility  Difficulty in walking, not elsewhere classified  Muscle weakness (generalized)  Other lack of coordination     Problem List Patient Active Problem List   Diagnosis Date Noted   Cerebral thrombosis with cerebral infarction 11/25/2020   Acute right-sided weakness 11/24/2020   History of mitral valve repair 11/26/2018   Prosthetic valve dysfunction 11/25/2018   Ectatic abdominal aorta ([place]) 11/25/2018   S/P  valve-in-valve TAVR 11/25/2018   History of bladder cancer    History of pulmonary embolus (PE)    Acute on chronic diastolic heart failure ([place]) 04/23/2018   History of aortic valve replacement 04/23/2018   Atrial fibrillation ([place]) 04/23/2018   Coronary atherosclerosis of native coronary artery 02/25/2014   Hyperlipidemia 02/25/2014   Embolism and thrombosis ([place]) 03/11/2013    Sumner Boast, PT 03/09/2021, 3:29 PM  [address]. [place], [name], [address] Phone: [phone]   Fax:  [phone]  Name: ZIMRI BRENNEN MRN: [id number] Date of Birth: Jun 26, 1929

## 2021-03-09 NOTE — Patient Instructions (Signed)
Access Code: [place] URL: [email or web address] Date: 03/09/2021 Prepared by: Lum Babe  Exercises Seated Ankle Dorsiflexion with Anchored Resistance - 1 x daily - 7 x weekly - 3 sets - 10 reps - 3 hold Seated Ankle Plantar Flexion with Resistance Loop - 1 x daily - 7 x weekly - 3 sets - 10 reps - 3 hold Seated Ankle Inversion with Resistance and Legs Crossed - 1 x daily - 7 x weekly - 3 sets - 10 reps - 3 hold Seated Ankle Eversion with Resistance - 1 x daily - 7 x weekly - 3 sets - 10 reps - 3 hold

## 2021-03-14 ENCOUNTER — Encounter: Payer: Self-pay | Admitting: Physical Therapy

## 2021-03-14 ENCOUNTER — Ambulatory Visit: Payer: Medicare Other | Admitting: Physical Therapy

## 2021-03-14 ENCOUNTER — Other Ambulatory Visit: Payer: Self-pay

## 2021-03-14 DIAGNOSIS — R278 Other lack of coordination: Secondary | ICD-10-CM

## 2021-03-14 DIAGNOSIS — R2689 Other abnormalities of gait and mobility: Secondary | ICD-10-CM

## 2021-03-14 DIAGNOSIS — M6281 Muscle weakness (generalized): Secondary | ICD-10-CM

## 2021-03-14 DIAGNOSIS — R262 Difficulty in walking, not elsewhere classified: Secondary | ICD-10-CM

## 2021-03-14 NOTE — Therapy (Signed)
[place]. [place], [name], [address] Phone: [phone]   Fax:  [phone]  Physical Therapy Treatment  Patient Details  Name: Eric [name] MRN: [id number] Date of Birth: 12-06-1929 Referring Provider (PT): Debbe Odea   Encounter Date: 03/14/2021   PT End of Session - 03/14/21 0928     Visit Number 25    Date for PT Re-Evaluation 04/13/21    Authorization Type UHC Medicare    PT Start Time 0842    PT Stop Time 0928    PT Time Calculation (min) 46 min    Activity Tolerance Patient tolerated treatment well    Behavior During Therapy [facility] for tasks assessed/performed             Past Medical History:  Diagnosis Date   Atrial flutter, paroxysmal ([place])    a. dx 04-02-2014, s/p  successful cardioversion 04-06-2014.   Chronic anticoagulation    on Eliquis--  due to recurrent dvt's and pe's   Dyslipidemia    History of bladder cancer urologist-  dr [name]   02-13-2016  s/p TURBT per path high grade papillary urothelial carcinoma   History of DVT (deep vein thrombosis)    2000-- RLE   History of melanoma excision    left flank;  07/ 2014 left lower leg and right upper chest   History of prostate cancer    Gleason 6--  s/p  radical prostatectomy 06/ 2000 in [place]---  no recurrence   History of pulmonary embolus (PE)    2000 & 2005  bilateral   Hx of valvuloplasty 06/19/2007   a. 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 05/2007 CABG x 1: s/p SVG-OM;  b. 10/2010 Ex MV: EF 72%, inf attenuation w/o ischemia, brief run of PAT with exercise. To   Stroke ([place])    Thrombocytopenia ([place])    Thyroid goiter 2013   nodular   Wears hearing aid    BILATERAL    Wears partial dentures    UPPER    Past Surgical History:  Procedure Laterality Date   AORTIC VALVE REPLACEMENT (AVR)/CORONARY ARTERY BYPASS GRAFTING (CABG)  06-19-2007  dr [name]   SVG to OM1 ;   AVR w/ #23 Mitralflow pericardial and ligation left atrial appendage;  MV repair w/ #28 Sorin 3-D memo Ring Annuloplasty with repair ruptured chordar of P-2 flail segments   CARDIAC CATHETERIZATION  05-12-2007  dr Leonia Reeves   single vessel 30% ostial LAD,  80% LCx;  severe to critial AS,  moderate MR,  normal LVF, ef 70%,  normal right heart pressures and cardiac outputs,  mild elevated LV end-diastolic pressure     CARDIOVASCULAR STRESS TEST  11-02-2010  dr Irish Lack   normal nuclear study w/ no ischemia or infarct/scar/  normal LV function and wall motion , ef 72%   CARDIOVERSION N/A 04/06/2014   Procedure: CARDIOVERSION;  Surgeon: [name], MD;  Location: [place];  Service: Cardiovascular;  Laterality: N/A;   successful   CATARACT EXTRACTION W/ INTRAOCULAR LENS  IMPLANT, BILATERAL  2012  approx   CORONARY ARTERY BYPASS GRAFT     PROSTATECTOMY     RETROPUBIC RADICAL PROSTATECTOMY  06/ 2000  in [place], [place]   RIGHT/LEFT HEART CATH AND CORONARY/GRAFT ANGIOGRAPHY N/A 10/29/2018   Procedure: RIGHT/LEFT HEART CATH AND CORONARY/GRAFT ANGIOGRAPHY;  Surgeon: [name], MD;  Location: [place] CV LAB;  Service: Cardiovascular;  Laterality: N/A;   ROTATOR CUFF REPAIR Left 09/1998   TEE WITHOUT CARDIOVERSION N/A 04/06/2014   Procedure: TRANSESOPHAGEAL ECHOCARDIOGRAM (TEE);  Surgeon: [name], MD;  Location: [facility] ENDOSCOPY;  Service: Cardiovascular;  Laterality: N/A;  mild focal basa LVH of the septum, ef 50-55%, s/p AV annuloplasty ring, elongated chordae, thickened leaflets, mild to mod. MR, multiple small jets/arotic bioprosthetic valve sits well in position, mild AI/ thickened TV w/ mild reurg./mild LA   TEE WITHOUT CARDIOVERSION N/A 10/22/2018   Procedure: TRANSESOPHAGEAL  ECHOCARDIOGRAM (TEE);  Surgeon: [name], MD;  Location: [facility] ENDOSCOPY;  Service: Cardiovascular;  Laterality: N/A;   TEE WITHOUT CARDIOVERSION N/A 11/25/2018   Procedure: TRANSESOPHAGEAL ECHOCARDIOGRAM (TEE);  Surgeon: [name], MD;  Location: [place];  Service: Open Heart Surgery;  Laterality: N/A;   TRANSCATHETER AORTIC VALVE REPLACEMENT, TRANSFEMORAL N/A 11/25/2018   Procedure: TRANSCATHETER AORTIC VALVE REPLACEMENT, TRANSFEMORAL;  Surgeon: [name], MD;  Location: [place];  Service: Open Heart Surgery;  Laterality: N/A;   TRANSTHORACIC ECHOCARDIOGRAM  03-23-2016   dr Irish Lack   EF 55-60%, reduced contribution atrial contraction to ventricular filling, due to increased ventricular diastolic pressure or atrial contratile dysfunction/  bioprosthetic AV w/ mod. regurg. (valve area 1.97cm^2)/ mild dilated ascending aorta/ mild thicken MV normal function annular ring prosthesis/severe LAE & mod. to sev.RAE/ PASP 19mmHg/ mild TR/ ventricle septal motion show paradox   TRANSURETHRAL RESECTION OF BLADDER TUMOR N/A 02/13/2016   Procedure: TRANSURETHRAL RESECTION OF BLADDER TUMOR (TURBT);  Surgeon: Nickie Retort, MD;  Location: [facility];  Service: Urology;  Laterality: N/A;   TRANSURETHRAL RESECTION OF BLADDER TUMOR N/A 09/04/2016   Procedure: TRANSURETHRAL RESECTION OF BLADDER TUMOR (TURBT);  Surgeon: Nickie Retort, MD;  Location: [facility];  Service: Urology;  Laterality: N/A;    There were no vitals filed for this visit.   Subjective Assessment - 03/14/21 0850     Subjective No falls, walking beter most of the time    Currently in Pain? No/denies                               [facility] Adult PT Treatment/Exercise - 03/14/21 0001       Ambulation/Gait   Gait Comments gait around the back building, some walking in the pine straw      High Level Balance   High Level Balance Comments on airex 6" toe touches with a SPC  and very close SBA, head turns , eyes closed and SLS 3 seconds      Knee/Hip Exercises: Aerobic   Recumbent Bike level 6 x 6 minutes      Knee/Hip Exercises: Machines for Strengthening   Cybex Knee Extension 10# 2x15    Cybex Knee Flexion 35lb 2x15    Cybex Leg Press 40# 2x10    Hip Cybex 5# hip abduction and extension 2x10, 25# rows and lats      Knee/Hip Exercises: Standing   Other Standing Knee Exercises 20# sit to stand 10x    Other Standing Knee Exercises 20# farmer carry                       PT Short Term Goals -  01/06/21 1032       PT SHORT TERM GOAL #1   Title Pt will be I and compliant with initial HEP.    Status Achieved               PT Long Term Goals - 03/09/21 1528       PT LONG TERM GOAL #1   Title Pt will be I and compliant with HEP.    Status Partially Met                   Plan - 03/14/21 1036     Clinical Impression Statement [name] is getting stronger, he continues to have difficulty on any dynamic surface, he has not had any stumbles in the past 2 weeks, he typically can self correct with a stepping sompensatory strategy, did welll today on solid surface with eyes closed, had some sway with head turns, again biggest issue is the dynamic surfaces    PT Next Visit Plan go over advanced HEP and d/c next visit, he reports that he will need pictures and handouts    Consulted and Agree with Plan of Care Patient             Patient will benefit from skilled therapeutic intervention in order to improve the following deficits and impairments:  Abnormal gait, Decreased coordination, Difficulty walking, Cardiopulmonary status limiting activity, Decreased endurance, Decreased activity tolerance, Decreased balance, Postural dysfunction, Decreased strength, Decreased mobility  Visit Diagnosis: Other abnormalities of gait and mobility  Difficulty in walking, not elsewhere classified  Muscle weakness (generalized)  Other lack of  coordination     Problem List Patient Active Problem List   Diagnosis Date Noted   Cerebral thrombosis with cerebral infarction 11/25/2020   Acute right-sided weakness 11/24/2020   History of mitral valve repair 11/26/2018   Prosthetic valve dysfunction 11/25/2018   Ectatic abdominal aorta ([place]) 11/25/2018   S/P valve-in-valve TAVR 11/25/2018   History of bladder cancer    History of pulmonary embolus (PE)    Acute on chronic diastolic heart failure ([place]) 04/23/2018   History of aortic valve replacement 04/23/2018   Atrial fibrillation ([place]) 04/23/2018   Coronary atherosclerosis of native coronary artery 02/25/2014   Hyperlipidemia 02/25/2014   Embolism and thrombosis ([place]) 03/11/2013    Sumner Boast, PT 03/14/2021, 10:38 AM  [place]. [place], [name], [address] Phone: [phone]   Fax:  [phone]  Name: BECK COFER MRN: [id number] Date of Birth: 19-Jan-1930

## 2021-03-16 ENCOUNTER — Other Ambulatory Visit: Payer: Self-pay

## 2021-03-16 ENCOUNTER — Ambulatory Visit: Payer: Medicare Other | Admitting: Physical Therapy

## 2021-03-16 ENCOUNTER — Encounter: Payer: Self-pay | Admitting: Physical Therapy

## 2021-03-16 DIAGNOSIS — R262 Difficulty in walking, not elsewhere classified: Secondary | ICD-10-CM

## 2021-03-16 DIAGNOSIS — M6281 Muscle weakness (generalized): Secondary | ICD-10-CM | POA: Diagnosis not present

## 2021-03-16 DIAGNOSIS — R2689 Other abnormalities of gait and mobility: Secondary | ICD-10-CM

## 2021-03-16 DIAGNOSIS — R278 Other lack of coordination: Secondary | ICD-10-CM

## 2021-03-16 NOTE — Therapy (Signed)
[address] [place], [name], [address] Phone: [phone]   Fax:  [phone]  Physical Therapy Treatment  Patient Details  Name: Eric [name] MRN: [id number] Date of Birth: 09-05-1929 Referring Provider (PT): Debbe Odea   Encounter Date: 03/16/2021   PT End of Session - 03/16/21 0932     Visit Number 26    Date for PT Re-Evaluation 04/13/21    Authorization Type [employer] Medicare    PT Start Time 0843    PT Stop Time 0928    PT Time Calculation (min) 45 min    Activity Tolerance Patient tolerated treatment well    Behavior During Therapy [facility] for tasks assessed/performed             Past Medical History:  Diagnosis Date   Atrial flutter, paroxysmal ([place])    a. dx 04-02-2014, s/p  successful cardioversion 04-06-2014.   Chronic anticoagulation    on Eliquis--  due to recurrent dvt's and pe's   Dyslipidemia    History of bladder cancer urologist-  dr [name]   02-13-2016  s/p TURBT per path high grade papillary urothelial carcinoma   History of DVT (deep vein thrombosis)    2000-- RLE   History of melanoma excision    left flank;  07/ 2014 left lower leg and right upper chest   History of prostate cancer    Gleason 6--  s/p  radical prostatectomy 06/ 2000 in [place]---  no recurrence   History of pulmonary embolus (PE)    2000 & 2005  bilateral   Hx of valvuloplasty 06/19/2007   a. 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 05/2007 CABG x 1: s/p SVG-OM;  b. 10/2010 Ex MV: EF 72%, inf attenuation w/o ischemia, brief run of PAT with exercise. To   Stroke ([place])    Thrombocytopenia ([place])    Thyroid goiter 2013   nodular   Wears hearing aid    BILATERAL    Wears partial dentures    UPPER    Past Surgical History:  Procedure Laterality Date   AORTIC VALVE REPLACEMENT (AVR)/CORONARY ARTERY BYPASS GRAFTING (CABG)  06-19-2007  dr [name]   SVG to OM1 ;   AVR w/ #23 Mitralflow pericardial and ligation left atrial appendage;  MV repair w/ #28 Sorin 3-D memo Ring Annuloplasty with repair ruptured chordar of P-2 flail segments   CARDIAC CATHETERIZATION  05-12-2007  dr Leonia Reeves   single vessel 30% ostial LAD,  80% LCx;  severe to critial AS,  moderate MR,  normal LVF, ef 70%,  normal right heart pressures and cardiac outputs,  mild elevated LV end-diastolic pressure     CARDIOVASCULAR STRESS TEST  11-02-2010  dr Irish Lack   normal nuclear study w/ no ischemia or infarct/scar/  normal LV function and wall motion , ef 72%   CARDIOVERSION N/A 04/06/2014   Procedure: CARDIOVERSION;  Surgeon: [name], MD;  Location: [facility];  Service: Cardiovascular;  Laterality: N/A;   successful   CATARACT EXTRACTION W/ INTRAOCULAR LENS  IMPLANT, BILATERAL  2012  approx   CORONARY ARTERY BYPASS GRAFT     PROSTATECTOMY     RETROPUBIC RADICAL PROSTATECTOMY  06/ 2000  in [place], [place]   RIGHT/LEFT HEART CATH AND CORONARY/GRAFT ANGIOGRAPHY N/A 10/29/2018   Procedure: RIGHT/LEFT HEART CATH AND CORONARY/GRAFT ANGIOGRAPHY;  Surgeon: [name], MD;  Location: [facility];  Service: Cardiovascular;  Laterality: N/A;   ROTATOR CUFF REPAIR Left 09/1998   TEE WITHOUT CARDIOVERSION N/A 04/06/2014   Procedure: TRANSESOPHAGEAL ECHOCARDIOGRAM (TEE);  Surgeon: [name], MD;  Location: [facility] ENDOSCOPY;  Service: Cardiovascular;  Laterality: N/A;  mild focal basa LVH of the septum, ef 50-55%, s/p AV annuloplasty ring, elongated chordae, thickened leaflets, mild to mod. MR, multiple small jets/arotic bioprosthetic valve sits well in position, mild AI/ thickened TV w/ mild reurg./mild LA   TEE WITHOUT CARDIOVERSION N/A 10/22/2018   Procedure: TRANSESOPHAGEAL  ECHOCARDIOGRAM (TEE);  Surgeon: [name], MD;  Location: [facility] ENDOSCOPY;  Service: Cardiovascular;  Laterality: N/A;   TEE WITHOUT CARDIOVERSION N/A 11/25/2018   Procedure: TRANSESOPHAGEAL ECHOCARDIOGRAM (TEE);  Surgeon: [name], MD;  Location: [facility];  Service: Open Heart Surgery;  Laterality: N/A;   TRANSCATHETER AORTIC VALVE REPLACEMENT, TRANSFEMORAL N/A 11/25/2018   Procedure: TRANSCATHETER AORTIC VALVE REPLACEMENT, TRANSFEMORAL;  Surgeon: [name], MD;  Location: [facility];  Service: Open Heart Surgery;  Laterality: N/A;   TRANSTHORACIC ECHOCARDIOGRAM  03-23-2016   dr Irish Lack   EF 55-60%, reduced contribution atrial contraction to ventricular filling, due to increased ventricular diastolic pressure or atrial contratile dysfunction/  bioprosthetic AV w/ mod. regurg. (valve area 1.97cm^2)/ mild dilated ascending aorta/ mild thicken MV normal function annular ring prosthesis/severe LAE & mod. to sev.RAE/ PASP 58mHg/ mild TR/ ventricle septal motion show paradox   TRANSURETHRAL RESECTION OF BLADDER TUMOR N/A 02/13/2016   Procedure: TRANSURETHRAL RESECTION OF BLADDER TUMOR (TURBT);  Surgeon: [name] MD;  [facility]  Service: Urology;  Laterality: N/A;   TRANSURETHRAL RESECTION OF BLADDER TUMOR N/A 09/04/2016   Procedure: TRANSURETHRAL RESECTION OF BLADDER TUMOR (TURBT);  Surgeon: [name] MD;  [facility]  Service: Urology;  Laterality: N/A;    There were no vitals filed for this visit.   Subjective Assessment - 03/16/21 0846     Subjective Doing well, just need to have a god plan for after D/C                [facility] - 03/16/21 0001       Berg Balance Test   Sit to Stand Able to stand without using hands and stabilize independently    Standing Unsupported Able to stand safely 2 minutes    Sitting with Back Unsupported but Feet Supported on Floor or Stool Able to sit safely and  securely 2 minutes    Stand to Sit Sits safely with minimal use of hands    Transfers Able to transfer safely, minor use of hands    Standing Unsupported with Eyes Closed Able to stand 10 seconds safely    Standing Unsupported with Feet Together Able to place feet together independently and stand for 1 minute with supervision    From Standing, Reach Forward with Outstretched Arm Can reach forward >12 cm safely (5")    From Standing Position, Pick up Object from Floor Able to pick up shoe safely and easily    From Standing Position, Turn to Look Behind Over each Shoulder Looks behind one side only/other side shows less weight shift    Turn 360 Degrees Able to turn 360 degrees safely one side only in 4 seconds or less  Standing Unsupported, Alternately Place Feet on Step/Stool Able to stand independently and complete 8 steps >20 seconds    Standing Unsupported, One Foot in Front Able to take small step independently and hold 30 seconds    Standing on One Leg Able to lift leg independently and hold equal to or more than 3 seconds    Total Score 47      Timed Up and Go Test   Normal TUG (seconds) 12                           [facility] Adult PT Treatment/Exercise - 03/16/21 0001       Ambulation/Gait   Gait Comments gait around the back building, some walking in the pine straw 2 laps      Knee/Hip Exercises: Aerobic   Recumbent Bike level 5 x 5 minutes                     PT Education - 03/16/21 0855     Education Details performed with patient all the HEP with bands and the balance, some cues needed and he had good questions and really cautioned him on the balance to assure his safety with this and the other exercises              PT Short Term Goals - 01/06/21 1032       PT SHORT TERM GOAL #1   Title Pt will be I and compliant with initial HEP.    Status Achieved               PT Long Term Goals - 03/16/21 1001       PT LONG TERM GOAL #1    Title Pt will be I and compliant with HEP.    Status Achieved      PT LONG TERM GOAL #2   Title Pt will improv BERG balance score 46/56 to show improved balance    Status Achieved      PT LONG TERM GOAL #3   Title decrease TUG time to 12 seconds    Status Achieved      PT LONG TERM GOAL #4   Title increase knee and ankle strength to 4+/5    Status Achieved                   Plan - 03/16/21 1000     Clinical Impression Statement Issued the advanced HEP for strength and balance, reports understanding and I really focues on his safety especially with the balance HEP, he verbalized and demonstrated understanding    PT Next Visit Plan d/c goals met    Consulted and Agree with Plan of Care Patient             Patient will benefit from skilled therapeutic intervention in order to improve the following deficits and impairments:  Abnormal gait, Decreased coordination, Difficulty walking, Cardiopulmonary status limiting activity, Decreased endurance, Decreased activity tolerance, Decreased balance, Postural dysfunction, Decreased strength, Decreased mobility  Visit Diagnosis: Other abnormalities of gait and mobility  Difficulty in walking, not elsewhere classified  Muscle weakness (generalized)  Other lack of coordination     Problem List Patient Active Problem List   Diagnosis Date Noted   Cerebral thrombosis with cerebral infarction 11/25/2020   Acute right-sided weakness 11/24/2020   History of mitral valve repair 11/26/2018   Prosthetic valve dysfunction 11/25/2018   Ectatic abdominal aorta ([place]) 11/25/2018  S/P valve-in-valve TAVR 11/25/2018   History of bladder cancer    History of pulmonary embolus (PE)    Acute on chronic diastolic heart failure ([place]) 04/23/2018   History of aortic valve replacement 04/23/2018   Atrial fibrillation ([place]) 04/23/2018   Coronary atherosclerosis of native coronary artery 02/25/2014   Hyperlipidemia 02/25/2014    Embolism and thrombosis ([place]) 03/11/2013    Sumner Boast, PT 03/16/2021, 10:02 AM  [address] [place], [name], [address] Phone: [phone]   Fax:  [phone]  Name: RENARDO CHEATUM MRN: [id number] Date of Birth: [birth year]

## 2021-03-16 NOTE — Patient Instructions (Signed)
Access Code: [email or web address] URL: [email or web address] Date: 03/16/2021 Prepared by: Lum Babe  Exercises Scapular Retraction with Resistance - 1 x daily - 7 x weekly - 3 sets - 10 reps - 3 hold Single Arm Shoulder Extension with Anchored Resistance - 1 x daily - 7 x weekly - 3 sets - 10 reps - 3 hold Standing Shoulder Horizontal Abduction with Resistance - 1 x daily - 7 x weekly - 3 sets - 10 reps - 3 hold Standing Shoulder External Rotation with Resistance - 1 x daily - 7 x weekly - 3 sets - 10 reps - 3 hold Standing Hip Extension with Anchored Resistance - 1 x daily - 7 x weekly - 3 sets - 10 reps - 3 hold Standing Repeated Hip Abduction with Resistance - 1 x daily - 7 x weekly - 3 sets - 10 reps - 3 hold Standing Bicep Curls with Resistance - 1 x daily - 7 x weekly - 3 sets - 10 reps - 3 hold Ankle Dorsiflexion with Resistance - 1 x daily - 7 x weekly - 3 sets - 10 reps - 3 hold Seated Ankle Plantar Flexion with Resistance Loop - 1 x daily - 7 x weekly - 3 sets - 10 reps - 3 hold Seated Ankle Eversion with Resistance - 1 x daily - 7 x weekly - 3 sets - 10 reps - 3 hold

## 2021-05-07 ENCOUNTER — Other Ambulatory Visit: Payer: Self-pay | Admitting: Interventional Cardiology

## 2021-05-09 ENCOUNTER — Encounter: Payer: Self-pay | Admitting: Adult Health

## 2021-05-09 ENCOUNTER — Other Ambulatory Visit: Payer: Self-pay

## 2021-05-09 ENCOUNTER — Ambulatory Visit: Payer: Medicare Other | Admitting: Adult Health

## 2021-05-09 VITALS — BP 135/82 | HR 69 | Ht 72.0 in | Wt 185.0 lb

## 2021-05-09 DIAGNOSIS — I10 Essential (primary) hypertension: Secondary | ICD-10-CM | POA: Diagnosis not present

## 2021-05-09 DIAGNOSIS — I4811 Longstanding persistent atrial fibrillation: Secondary | ICD-10-CM

## 2021-05-09 DIAGNOSIS — E785 Hyperlipidemia, unspecified: Secondary | ICD-10-CM

## 2021-05-09 DIAGNOSIS — R200 Anesthesia of skin: Secondary | ICD-10-CM

## 2021-05-09 DIAGNOSIS — I639 Cerebral infarction, unspecified: Secondary | ICD-10-CM

## 2021-05-09 DIAGNOSIS — I69393 Ataxia following cerebral infarction: Secondary | ICD-10-CM | POA: Diagnosis not present

## 2021-05-09 DIAGNOSIS — R202 Paresthesia of skin: Secondary | ICD-10-CM

## 2021-05-09 NOTE — Patient Instructions (Signed)
Continue aspirin 81 mg daily and Eliquis (apixaban) daily  and atorvastatin and Zetia for secondary stroke prevention  Continue to follow with cardiology for atrial fibrillation and Eliquis management  Continue to follow up with PCP regarding cholesterol and blood pressure management  Maintain strict control of hypertension with blood pressure goal below 130/90 and cholesterol with LDL cholesterol (bad cholesterol) goal below 70 mg/dL.   Signs of a Stroke? Follow the BEFAST method:  Balance Watch for a sudden loss of balance, trouble with coordination or vertigo Eyes Is there a sudden loss of vision in one or both eyes? Or double vision?  Face: Ask the person to smile. Does one side of the face droop or is it numb?  Arms: Ask the person to raise both arms. Does one arm drift downward? Is there weakness or numbness of a leg? Speech: Ask the person to repeat a simple phrase. Does the speech sound slurred/strange? Is the person confused ? Time: If you observe any of these signs, call 911.        Thank you for coming to see [place] at [facility]. I hope we have been able to provide you high quality care today.  You may receive a patient satisfaction survey over the next few weeks. We would appreciate your feedback and comments so that we may continue to improve ourselves and the health of our patients.

## 2021-05-09 NOTE — Progress Notes (Signed)
[facility] [address] [place]. [place] [address] [phone]       STROKE FOLLOW UP NOTE  Mr. Eric [name] Date of Birth:  1929-06-01 Medical Record Number:  [id number]   Reason for Referral: stroke follow up    SUBJECTIVE:   CHIEF COMPLAINT:  Chief Complaint  Patient presents with   Follow-up    RM 3 with spouse Lelon Frohlich PT Is well and stable, no new concerns     HPI:   Update 05/09/2021 Eric [name]: Returns for 80-month stroke follow-up accompanied by his wife.  Overall stable -denies new stroke/TIA symptoms Completed PT back in October meeting all stated goals - does report continued imbalance but has not continued recommended HEP over the past 2 months Recent driving eval - was told he needs visual testing and possible more therapy for RLE weakness and incoordination. Has f/u with ophthalmology in February. Questions additional therapy to focus on RLE. Will occasionally drag right foot or trip but thankfully no recent falls  Compliant on Eliquis and aspirin -denies side effects Compliant on atorvastatin and Zetia -denies side effects Blood pressure today 135/82  Wife does mention decreased sensation in soles of feet bilaterally which is not new but has never previously had this looked at. She also notes color changes of BLE distally  No further concerns at this time     History provided for reference purposes only Initial visit 01/03/2021 Eric [name]: [name] is being seen for hospital follow-up accompanied by his wife.  Overall doing well.  Currently working with PT/OT at [facility] for residual gait impairment and mild RUE coordination difficulties - reports continued progress.  Baseline gait impairment with imbalance worsened post stroke.  Able to ambulate without assistive device - denies any recent falls.  He has not yet returned back to driving.  Denies new stroke/TIA symptoms.  Remains on Eliquis and aspirin with easier bruising but no bleeding.  Remains  on atorvastatin and Zetia without side effects.  Blood pressure 122/78.  Stable with home monitoring.  No further concerns at this time.  Stroke admission 11/24/2020 Eric [name] is a 87 year old male with history of A. fib on Eliquis, DVT/PE, AV repair x2, HTN and HLD admitted on 11/24/2020 for right hand weakness and right leg weakness since night prior.  Personally reviewed hospitalization pertinent progress notes, lab work and imaging. CT no acute abnormality.  MRI showed left semiovale small infarct.  Old lacunar infarct bilateral white matter, BG, right thalamic, right cerebellum.  MRA head and neck negative.  LDL 66, A1c 5.8, EF 55 to 60%.  Creatinine 0.85.  Etiology likely due to small vessel disease given location.  Recommended continued use of Eliquis for hx of A. Fib with additional aspirin 81 mg daily for further stroke prevention and continue atorvastatin 40 mg daily and Zetia.  PT/OT recommended outpatient PT/OT for residual imbalance.     ROS:   14 system review of systems performed and negative with exception of those listed in HPI  PMH:  Past Medical History:  Diagnosis Date   Atrial flutter, paroxysmal ([place])    a. dx 04-02-2014, s/p  successful cardioversion 04-06-2014.   Chronic anticoagulation    on Eliquis--  due to recurrent dvt's and pe's   Dyslipidemia    History of bladder cancer urologist-  dr [name]   02-13-2016  s/p TURBT per path high grade papillary urothelial carcinoma   History of DVT (deep vein thrombosis)    2000-- RLE  History of melanoma excision    left flank;  07/ 2014 left lower leg and right upper chest   History of prostate cancer    Gleason 6--  s/p  radical prostatectomy 06/ 2000 in [place]---  no recurrence   History of pulmonary embolus (PE)    2000 & 2005  bilateral   Hx of valvuloplasty 06/19/2007   a. 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 05/2007 CABG x 1: s/p SVG-OM;  b. 10/2010 Ex MV: EF 72%, inf attenuation w/o ischemia, brief run of PAT with exercise. To   Stroke ([place])    Thrombocytopenia ([place])    Thyroid goiter 2013   nodular   Wears hearing aid    BILATERAL   Wears partial dentures    UPPER    PSH:  Past Surgical History:  Procedure Laterality Date   AORTIC VALVE REPLACEMENT (AVR)/CORONARY ARTERY BYPASS GRAFTING (CABG)  06-19-2007  dr [name]   SVG to OM1 ;   AVR w/ #23 Mitralflow pericardial and ligation left atrial appendage;  MV repair w/ #28 Sorin 3-D memo Ring Annuloplasty with repair ruptured chordar of P-2 flail segments   CARDIAC CATHETERIZATION  05-12-2007  dr Leonia Reeves   single vessel 30% ostial LAD,  80% LCx;  severe to critial AS,  moderate MR,  normal LVF, ef 70%,  normal right heart pressures and cardiac outputs,  mild elevated LV end-diastolic pressure     CARDIOVASCULAR STRESS TEST  11-02-2010  dr Irish Lack   normal nuclear study w/ no ischemia or infarct/scar/  normal LV function and wall motion , ef 72%   CARDIOVERSION N/A 04/06/2014   Procedure: CARDIOVERSION;  Surgeon: [name], MD;  Location: [facility];  Service: Cardiovascular;  Laterality: N/A;   successful   CATARACT EXTRACTION W/ INTRAOCULAR LENS  IMPLANT, BILATERAL  2012  approx   CORONARY ARTERY BYPASS GRAFT     PROSTATECTOMY     RETROPUBIC RADICAL PROSTATECTOMY  06/ 2000  in [place], [place]   RIGHT/LEFT HEART CATH AND CORONARY/GRAFT ANGIOGRAPHY N/A 10/29/2018   Procedure: RIGHT/LEFT HEART CATH AND CORONARY/GRAFT ANGIOGRAPHY;  Surgeon: [name], MD;  Location: [facility];  Service: Cardiovascular;  Laterality: N/A;   ROTATOR CUFF REPAIR Left 09/1998   TEE WITHOUT CARDIOVERSION N/A 04/06/2014   Procedure: TRANSESOPHAGEAL ECHOCARDIOGRAM (TEE);  Surgeon: [name], MD;  Location: [facility] ENDOSCOPY;  Service: Cardiovascular;  Laterality: N/A;  mild focal basa LVH of the septum, ef 50-55%, s/p AV annuloplasty ring, elongated chordae, thickened leaflets, mild to mod. MR, multiple small jets/arotic bioprosthetic valve sits well in position, mild AI/ thickened TV w/ mild reurg./mild LA   TEE WITHOUT CARDIOVERSION N/A 10/22/2018   Procedure: TRANSESOPHAGEAL ECHOCARDIOGRAM (TEE);  Surgeon: [name], MD;  Location: [facility] ENDOSCOPY;  Service: Cardiovascular;  Laterality: N/A;   TEE WITHOUT CARDIOVERSION N/A 11/25/2018   Procedure: TRANSESOPHAGEAL ECHOCARDIOGRAM (TEE);  Surgeon: [name], MD;  Location: [facility];  Service: Open Heart Surgery;  Laterality: N/A;   TRANSCATHETER AORTIC VALVE REPLACEMENT, TRANSFEMORAL N/A 11/25/2018   Procedure: TRANSCATHETER AORTIC VALVE REPLACEMENT, TRANSFEMORAL;  Surgeon: [name], MD;  Location: [facility];  Service: Open Heart Surgery;  Laterality: N/A;   TRANSTHORACIC ECHOCARDIOGRAM  03-23-2016  dr Irish Lack   EF 55-60%, reduced contribution atrial contraction to ventricular filling, due to increased ventricular diastolic pressure or atrial contratile dysfunction/  bioprosthetic AV w/ mod. regurg. (valve area 1.97cm^2)/ mild dilated ascending aorta/ mild thicken MV normal function annular ring prosthesis/severe LAE & mod. to sev.RAE/ PASP 90mmHg/ mild TR/ ventricle septal motion show paradox   TRANSURETHRAL RESECTION OF BLADDER TUMOR N/A 02/13/2016   Procedure: TRANSURETHRAL RESECTION OF BLADDER TUMOR (TURBT);  Surgeon: Nickie Retort, MD;  Location: [facility];  Service: Urology;  Laterality: N/A;   TRANSURETHRAL RESECTION OF BLADDER TUMOR N/A 09/04/2016   Procedure: TRANSURETHRAL RESECTION OF BLADDER TUMOR (TURBT);  Surgeon: Nickie Retort, MD;  Location: [facility];  Service: Urology;  Laterality: N/A;    Social History:  Social History   Socioeconomic History   Marital  status: Married    Spouse name: Ann   Number of children: 2   Years of education: Not on file   Highest education level: Some college, no degree  Occupational History   Occupation: Retired  Tobacco Use   Smoking status: Never   Smokeless tobacco: Never  Substance and Sexual Activity   Alcohol use: Yes    Alcohol/week: 1.0 - 2.0 standard drink    Types: 1 - 2 Standard drinks or equivalent per week    Comment: OCC   Drug use: No   Sexual activity: Not on file  Other Topics Concern   Not on file  Social History Narrative   01/03/21 Lives in [place] with wife.  Active.   Caffeine- 1-2 c coffee daily   Social Determinants of Health   Financial Resource Strain: Not on file  Food Insecurity: Not on file  Transportation Needs: Not on file  Physical Activity: Not on file  Stress: Not on file  Social Connections: Not on file  Intimate Partner Violence: Not on file    Family History:  Family History  Problem Relation Age of Onset   Leukemia Father    Supraventricular tachycardia Sister    Heart attack Neg Hx    Stroke Neg Hx    Hypertension Neg Hx     Medications:   Current Outpatient Medications on File Prior to Visit  Medication Sig Dispense Refill   amoxicillin (AMOXIL) 500 MG tablet Take 4 tablets (2,000 mg total) by mouth as directed. Take 1 hour prior to dental work, including cleanings. 4 tablet 6   aspirin EC 81 MG tablet Take 81 mg by mouth daily. Swallow whole.     atorvastatin (LIPITOR) 40 MG tablet TAKE 1 TABLET BY MOUTH IN  THE EVENING 90 tablet 3   COVID-19 mRNA Vac-TriS, Pfizer, (PFIZER-BIONT COVID-19 VAC-TRIS) SUSP injection Inject into the muscle. 0.3 mL 0   ELIQUIS 5 MG TABS tablet TAKE 1 TABLET BY MOUTH  TWICE DAILY 180 tablet 3   Eyelid Cleansers (OCUSOFT EYELID CLEANSING) PADS Place 1 application into the left eye in the morning.     ezetimibe (ZETIA) 10 MG tablet TAKE 1 TABLET BY MOUTH IN  THE EVENING 90 tablet 0   furosemide (LASIX) 20 MG tablet TAKE 1  TABLET BY MOUTH  EVERY OTHER DAY, MAY TAKE 1 ADDITIONAL DOSE IF NEEDED  FOR LEG SWELLING 90 tablet 3   losartan (COZAAR) 25 MG tablet Take 1 tablet (25 mg total) by mouth daily. 90 tablet 3   No current facility-administered medications on file prior to visit.    Allergies:  No Known Allergies  OBJECTIVE:  Physical Exam  Vitals:   05/09/21 1316  BP: 135/82  Pulse: 69  Weight: 185 lb (83.9 kg)  Height: 6' (1.829 m)    Body mass index is 25.09 kg/m. No results found. BP General: well developed, well nourished, very pleasant elderly Caucasian male, seated, in no evident distress Head: head normocephalic and atraumatic.   Neck: supple with no carotid or supraclavicular bruits Cardiovascular: irregular rate and rhythm, no murmurs Musculoskeletal: no deformity Skin:  no rash/petichiae Vascular:  vascular color changes BLE distally    Neurologic Exam Mental Status: Awake and fully alert.  Fluent speech and language.  Oriented to place and time. Recent and remote memory intact. Attention span, concentration and fund of knowledge appropriate. Mood and affect appropriate.  Cranial Nerves: Pupils equal, briskly reactive to light. Extraocular movements full without nystagmus except convergence insufficiency. Visual fields full to confrontation. Hearing intact. Facial sensation intact. Face, tongue, palate moves normally and symmetrically.  Motor: Normal bulk and tone. Normal strength in all tested extremity muscles except very slight decreased right hand dexterity and very slight right hip flexor weakness Sensory.: intact to touch , pinprick , position and vibratory sensation except decrease sensation BLE distally Coordination: Rapid alternating movements normal in all extremities. Finger-to-nose and heel-to-shin performed mild ataxia right side. Mild L>R upper extremity action tremors (chronic) Gait and Station: Arises from chair without difficulty. Stance is normal. Gait demonstrates  decreased stride length and step height of RLE and mild unsteadiness especially with turns without assistive device.  Tandem walk and heel toe not attempted Reflexes: 1+ and symmetric. Toes downgoing.        ASSESSMENT: Eric [name] is a 87 y.o. year old male with recent left frontal centrum semiovale infarct on 11/24/2020 secondary to small vessel disease after presenting with right-sided weakness.  Vascular risk factors include prior strokes on imaging (b/l WM, BG, R thalamic and R cerebellum), HTN, HLD, bladder cancer dx'd s/p TURBT 2017, prostate cancer s/p prostatectomy 2000, A. fib on Eliquis, hx of DVT/PE, chronic diastolic heart failure, MV repair and single vessel bypass and AV replacement 2009.     PLAN:  Recent ischemic infarct:  Residual deficit: Gait impairment with imbalance and mild right-sided ataxia - referral placed to [facility] for additional therapy focusing on RLE ataxia with goals of return to driving Continue aspirin 81 mg daily and Eliquis (apixaban) daily  and atorvastatin 40 mg daily and Zetia 10 times daily for secondary stroke prevention.   Discussed secondary stroke prevention measures and importance of close PCP follow up for aggressive stroke risk factor management. I have gone over the pathophysiology of stroke, warning signs and symptoms, risk factors and their management in some detail with instructions to go to the closest emergency room for symptoms of concern. Atrial fibrillation: On Eliquis 5 mg twice daily managed by cardiology; f/u visit scheduled 05/2021 HTN: BP goal <130/90.  Stable on current regimen per PCP HLD: LDL goal <70.  Prior LDL 66 -continue current statin regimen per PCP.  BLE sensory deficit: unknown etiology. No hx of diabetes or fm hx of neuropathy. Chronic without worsening. Due to skin color changes, would recommend further evaluation with cardiology/vascular surgery first - has appt with cardiology next month - I do not believe  emergent eval warrented as these symptoms chronic and no new symptoms or worsening symptoms. If unremarkable, can discuss further evaluation from neuro standpoint   Follow-up in 6 months or call earlier if needed  CC:  PCP: [name], MD     I spent 38 minutes of face-to-face and non-face-to-face time with patient and wife.  This included previsit chart review, lab review, study review, electronic health record documentation, patient and wife education regarding prior stroke including etiology, residual deficits and typical recovery time, secondary stroke prevention measures and importance of managing stroke risk factors, chronic numbness and possible etiologies and answered all other questions to patient and wife's satisfaction  Frann Rider, AGNP-BC  [facility] [address] [place], [place] [address]  Phone [phone] Fax [phone] Note: This document was prepared with digital dictation and possible smart phrase technology. Any transcriptional errors that result from this process are unintentional.

## 2021-05-24 ENCOUNTER — Ambulatory Visit: Payer: Medicare Other | Attending: Internal Medicine | Admitting: Physical Therapy

## 2021-05-24 ENCOUNTER — Other Ambulatory Visit: Payer: Self-pay

## 2021-05-24 ENCOUNTER — Encounter: Payer: Self-pay | Admitting: Physical Therapy

## 2021-05-24 DIAGNOSIS — M6281 Muscle weakness (generalized): Secondary | ICD-10-CM | POA: Insufficient documentation

## 2021-05-24 DIAGNOSIS — I69393 Ataxia following cerebral infarction: Secondary | ICD-10-CM | POA: Insufficient documentation

## 2021-05-24 DIAGNOSIS — R278 Other lack of coordination: Secondary | ICD-10-CM | POA: Diagnosis present

## 2021-05-24 DIAGNOSIS — R2689 Other abnormalities of gait and mobility: Secondary | ICD-10-CM | POA: Insufficient documentation

## 2021-05-24 DIAGNOSIS — R262 Difficulty in walking, not elsewhere classified: Secondary | ICD-10-CM | POA: Diagnosis present

## 2021-05-24 NOTE — Therapy (Signed)
[place]. [place], [name], [address] Phone: [phone]   Fax:  [phone]  Physical Therapy Evaluation  Patient Details  Name: Eric [name] MRN: [id number] Date of Birth: 14-Nov-1929 Referring Provider (PT): Frann Rider   Encounter Date: 05/24/2021   PT End of Session - 05/24/21 1626     Visit Number 1    Date for PT Re-Evaluation 08/22/21    Authorization Type UHC Medicare    PT Start Time 8546    PT Stop Time 1618    PT Time Calculation (min) 48 min    Activity Tolerance Patient tolerated treatment well    Behavior During Therapy [facility] for tasks assessed/performed             Past Medical History:  Diagnosis Date   Atrial flutter, paroxysmal ([place])    a. dx 04-02-2014, s/p  successful cardioversion 04-06-2014.   Chronic anticoagulation    on Eliquis--  due to recurrent dvt's and pe's   Dyslipidemia    History of bladder cancer urologist-  dr [name]   02-13-2016  s/p TURBT per path high grade papillary urothelial carcinoma   History of DVT (deep vein thrombosis)    2000-- RLE   History of melanoma excision    left flank;  07/ 2014 left lower leg and right upper chest   History of prostate cancer    Gleason 6--  s/p  radical prostatectomy 06/ 2000 in [place]---  no recurrence   History of pulmonary embolus (PE)    2000 & 2005  bilateral   Hx of valvuloplasty 06/19/2007   a. 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 05/2007 CABG x 1: s/p SVG-OM;  b. 10/2010 Ex MV: EF 72%, inf attenuation w/o ischemia, brief run of PAT with exercise. To   Stroke ([place])    Thrombocytopenia ([place])    Thyroid goiter 2013   nodular   Wears hearing aid    BILATERAL    Wears partial dentures    UPPER    Past Surgical History:  Procedure Laterality Date   AORTIC VALVE REPLACEMENT (AVR)/CORONARY ARTERY BYPASS GRAFTING (CABG)  06-19-2007  dr [name]   SVG to OM1 ;   AVR w/ #23 Mitralflow pericardial and ligation left atrial appendage;  MV repair w/ #28 Sorin 3-D memo Ring Annuloplasty with repair ruptured chordar of P-2 flail segments   CARDIAC CATHETERIZATION  05-12-2007  dr Leonia Reeves   single vessel 30% ostial LAD,  80% LCx;  severe to critial AS,  moderate MR,  normal LVF, ef 70%,  normal right heart pressures and cardiac outputs,  mild elevated LV end-diastolic pressure     CARDIOVASCULAR STRESS TEST  11-02-2010  dr Irish Lack   normal nuclear study w/ no ischemia or infarct/scar/  normal LV function and wall motion , ef 72%   CARDIOVERSION N/A 04/06/2014   Procedure: CARDIOVERSION;  Surgeon: [name], MD;  Location: [place];  Service: Cardiovascular;  Laterality: N/A;   successful   CATARACT EXTRACTION W/ INTRAOCULAR LENS  IMPLANT, BILATERAL  2012  approx   CORONARY ARTERY BYPASS GRAFT     PROSTATECTOMY     RETROPUBIC RADICAL PROSTATECTOMY  06/ 2000  in [place]   RIGHT/LEFT HEART CATH AND CORONARY/GRAFT ANGIOGRAPHY N/A 10/29/2018   Procedure: RIGHT/LEFT HEART CATH AND CORONARY/GRAFT ANGIOGRAPHY;  Surgeon: [name], MD;  Location: [place] [facility];  Service: Cardiovascular;  Laterality: N/A;   ROTATOR CUFF REPAIR Left 09/1998   TEE WITHOUT CARDIOVERSION N/A 04/06/2014   Procedure: TRANSESOPHAGEAL ECHOCARDIOGRAM (TEE);  Surgeon: [name], MD;  Location: [facility] ENDOSCOPY;  Service: Cardiovascular;  Laterality: N/A;  mild focal basa LVH of the septum, ef 50-55%, s/p AV annuloplasty ring, elongated chordae, thickened leaflets, mild to mod. MR, multiple small jets/arotic bioprosthetic valve sits well in position, mild AI/ thickened TV w/ mild reurg./mild LA   TEE WITHOUT CARDIOVERSION N/A 10/22/2018   Procedure: TRANSESOPHAGEAL  ECHOCARDIOGRAM (TEE);  Surgeon: [name], MD;  Location: [facility] ENDOSCOPY;  Service: Cardiovascular;  Laterality: N/A;   TEE WITHOUT CARDIOVERSION N/A 11/25/2018   Procedure: TRANSESOPHAGEAL ECHOCARDIOGRAM (TEE);  Surgeon: [name], MD;  Location: [place];  Service: Open Heart Surgery;  Laterality: N/A;   TRANSCATHETER AORTIC VALVE REPLACEMENT, TRANSFEMORAL N/A 11/25/2018   Procedure: TRANSCATHETER AORTIC VALVE REPLACEMENT, TRANSFEMORAL;  Surgeon: [name], MD;  Location: [place];  Service: Open Heart Surgery;  Laterality: N/A;   TRANSTHORACIC ECHOCARDIOGRAM  03-23-2016   dr Irish Lack   EF 55-60%, reduced contribution atrial contraction to ventricular filling, due to increased ventricular diastolic pressure or atrial contratile dysfunction/  bioprosthetic AV w/ mod. regurg. (valve area 1.97cm^2)/ mild dilated ascending aorta/ mild thicken MV normal function annular ring prosthesis/severe LAE & mod. to sev.RAE/ PASP 5mmHg/ mild TR/ ventricle septal motion show paradox   TRANSURETHRAL RESECTION OF BLADDER TUMOR N/A 02/13/2016   Procedure: TRANSURETHRAL RESECTION OF BLADDER TUMOR (TURBT);  Surgeon: Nickie Retort, MD;  Location: [facility];  Service: Urology;  Laterality: N/A;   TRANSURETHRAL RESECTION OF BLADDER TUMOR N/A 09/04/2016   Procedure: TRANSURETHRAL RESECTION OF BLADDER TUMOR (TURBT);  Surgeon: Nickie Retort, MD;  Location: [facility];  Service: Urology;  Laterality: N/A;    There were no vitals filed for this visit.    Subjective Assessment - 05/24/21 1540     Subjective Patient and his wife come in together, we have seen him this past year for a stroke November 23, 2020.  I discharged in October.  He reports that he went to [place] and did well.  He would like to get back to driving, he had an evaluation, they felt like he had good reaction time but could not get the foot completely on the pedals and they felt like his right leg was weaker.   They report no falls.    Patient is accompained by: Family member    Pertinent History Lt frontal centrum semi ovale infarct secondary to small vessel disease (onset 11/23/20), DVT/PE, paroxysmal a-fib on Eliquis, HLD, aortic valve replacement (x2) w/ bioprosthetic cardiac valve, mitral ring annuloplasty, TAVR, CAD    Limitations Walking;House hold activities    Patient Stated Goals stronger, better coordination, ultimately driving    Currently in Pain? No/denies                [facility] PT Assessment - 05/24/21 0001       Assessment   Medical Diagnosis Lt frontal centrum semi ovale infarct 2/2 small vessel disease, weakness and difficulty walking    Referring Provider (PT) Frann Rider    Onset Date/Surgical Date 11/23/20    Hand Dominance Right    Prior Therapy yes  Precautions   Precautions Fall      Balance Screen   Has the patient fallen in the past 6 months No   wife reports near falls, stumbles   Has the patient had a decrease in activity level because of a fear of falling?  No    Is the patient reluctant to leave their home because of a fear of falling?  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      AROM   AROM Assessment Site Ankle    Right/Left Ankle Right    Right Ankle Dorsiflexion -10   from neutral   Right Ankle Plantar Flexion 20    Right Ankle Inversion 8    Right Ankle Eversion 0      Strength   Right Hip Flexion 4/5    Right Hip Extension 4/5    Right Hip ABduction 4-/5    Right Knee Flexion 4/5    Right Knee Extension 4-/5    Right Ankle Dorsiflexion 3+/5    Right Ankle Plantar Flexion 4-/5    Right Ankle Inversion 3-/5    Right Ankle Eversion 3-/5    Left Ankle Dorsiflexion 2+/5     Left Ankle Plantar Flexion 3+/5    Left Ankle Inversion 2+/5    Left Ankle Eversion 2+/5      Flexibility   Soft Tissue Assessment /Muscle Length yes    Hamstrings very tight hamstrings and calves      Ambulation/Gait   Gait Comments no device, shuffles feet, heavy right toe drop, unsteady with turns      [employer]   Sit to Stand Able to stand without using hands and stabilize independently    Standing Unsupported Able to stand safely 2 minutes    Sitting with Back Unsupported but Feet Supported on Floor or Stool Able to sit safely and securely 2 minutes    Stand to Sit Controls descent by using hands    Transfers Able to transfer safely, definite need of hands    Standing Unsupported with Eyes Closed Able to stand 10 seconds with supervision    Standing Unsupported with Feet Together Able to place feet together independently and stand for 1 minute with supervision    From Standing, Reach Forward with Outstretched Arm Can reach forward >12 cm safely (5")    From Standing Position, Pick up Object from Floor Able to pick up shoe safely and easily    From Standing Position, Turn to Look Behind Over each Shoulder Turn sideways only but maintains balance    Turn 360 Degrees Able to turn 360 degrees safely but slowly    Standing Unsupported, Alternately Place Feet on Step/Stool Able to complete 4 steps without aid or supervision    Standing Unsupported, One Foot in Front Able to take small step independently and hold 30 seconds    Standing on One Leg Tries to lift leg/unable to hold 3 seconds but remains standing independently    Total Score 40      Timed Up and Go Test   Normal TUG (seconds) 13  Objective measurements completed on examination: See above findings.                  PT Short Term Goals - 05/24/21 1638       PT SHORT TERM GOAL #1   Title Pt will be I and compliant with initial HEP.    Time 2    Period Weeks     Status New               PT Long Term Goals - 05/24/21 1638       PT LONG TERM GOAL #1   Title Pt will be I and compliant with HEP.    Time 12    Period Weeks    Status New      PT LONG TERM GOAL #2   Title Pt will improv BERG balance score 47/56 to show improved balance    Time 12    Period Weeks    Status New      PT LONG TERM GOAL #3   Title decrease TUG time to 12 seconds    Time 12    Period Weeks    Status New      PT LONG TERM GOAL #4   Title increase knee and ankle strength to 4+/5    Time 12    Period Weeks    Status New      PT LONG TERM GOAL #5   Title right ankle DF to 5 degrees actively    Time 12    Period Weeks    Status New                    Plan - 05/24/21 1627     Clinical Impression Statement Patient had a CVA that affected the right LE in July 2022, we saw him until October where he made great progress to goals of walking better and having better balance.  He reports that he went to [place] in November and tried to do some exercises on his own at home.  He and his wife had him take a driving evaluation, the evaluator felt like the right LE was weaker and he had some less coordination of the right foot.  He was referred to PT to help address this.  He has a decrease in right ankle motions and strength, some loss of strength of the right hip and went from 47/56 to 40/56 on the [place] putting him at an increased risk for falls.  With walking their was much more right foot shuffle and was unsteady with turns    Stability/Clinical Decision Making Stable/Uncomplicated    Clinical Decision Making Low    Rehab Potential Good    PT Frequency 2x / week    PT Duration 12 weeks    PT Treatment/Interventions ADLs/Self Care Home Management;Gait training;Neuromuscular re-education;Balance training;Therapeutic exercise;Therapeutic activities;Functional mobility training;Stair training;Patient/family education;Manual techniques    PT Next Visit Plan  work on right LE strength, work on balance and function    Consulted and Agree with Plan of Care Patient             Patient will benefit from skilled therapeutic intervention in order to improve the following deficits and impairments:  Abnormal gait, Decreased coordination, Difficulty walking, Cardiopulmonary status limiting activity, Decreased endurance, Decreased activity tolerance, Decreased balance, Postural dysfunction, Decreased strength, Decreased mobility  Visit Diagnosis: Other abnormalities of gait and mobility - Plan: PT plan of care cert/re-cert  Difficulty in walking,  not elsewhere classified - Plan: PT plan of care cert/re-cert  Muscle weakness (generalized) - Plan: PT plan of care cert/re-cert     Problem List Patient Active Problem List   Diagnosis Date Noted   Cerebral thrombosis with cerebral infarction 11/25/2020   Acute right-sided weakness 11/24/2020   History of mitral valve repair 11/26/2018   Prosthetic valve dysfunction 11/25/2018   Ectatic abdominal aorta ([place]) 11/25/2018   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 [place], [name], [address] Phone: [phone]   Fax:  [phone]  Name: Eric [name] MRN: [id number] Date of Birth: Jan 16, 1930

## 2021-05-30 ENCOUNTER — Ambulatory Visit: Payer: Medicare Other | Admitting: Physical Therapy

## 2021-05-30 ENCOUNTER — Other Ambulatory Visit: Payer: Self-pay

## 2021-05-30 ENCOUNTER — Encounter: Payer: Self-pay | Admitting: Physical Therapy

## 2021-05-30 DIAGNOSIS — M6281 Muscle weakness (generalized): Secondary | ICD-10-CM

## 2021-05-30 DIAGNOSIS — R2689 Other abnormalities of gait and mobility: Secondary | ICD-10-CM

## 2021-05-30 DIAGNOSIS — R262 Difficulty in walking, not elsewhere classified: Secondary | ICD-10-CM

## 2021-05-30 NOTE — Therapy (Signed)
[place]. [place], [name], [address] Phone: [phone]   Fax:  [phone]  Physical Therapy Treatment  Patient Details  Name: Eric [name] MRN: [id number] Date of Birth: 23-Aug-1929 Referring Provider (PT): Frann Rider   Encounter Date: 05/30/2021   PT End of Session - 05/30/21 1055     Visit Number 2    Date for PT Re-Evaluation 08/22/21    Authorization Type UHC Medicare    PT Start Time 5643    PT Stop Time 1059    PT Time Calculation (min) 44 min    Activity Tolerance Patient tolerated treatment well    Behavior During Therapy [facility] for tasks assessed/performed             Past Medical History:  Diagnosis Date   Atrial flutter, paroxysmal ([place])    a. dx 04-02-2014, s/p  successful cardioversion 04-06-2014.   Chronic anticoagulation    on Eliquis--  due to recurrent dvt's and pe's   Dyslipidemia    History of bladder cancer urologist-  dr [name]   02-13-2016  s/p TURBT per path high grade papillary urothelial carcinoma   History of DVT (deep vein thrombosis)    2000-- RLE   History of melanoma excision    left flank;  07/ 2014 left lower leg and right upper chest   History of prostate cancer    Gleason 6--  s/p  radical prostatectomy 06/ 2000 in [place]---  no recurrence   History of pulmonary embolus (PE)    2000 & 2005  bilateral   Hx of valvuloplasty 06/19/2007   a. 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 05/2007 CABG x 1: s/p SVG-OM;  b. 10/2010 Ex MV: EF 72%, inf attenuation w/o ischemia, brief run of PAT with exercise. To   Stroke ([place])    Thrombocytopenia ([place])    Thyroid goiter 2013   nodular   Wears hearing aid    BILATERAL    Wears partial dentures    UPPER    Past Surgical History:  Procedure Laterality Date   AORTIC VALVE REPLACEMENT (AVR)/CORONARY ARTERY BYPASS GRAFTING (CABG)  06-19-2007  dr [name]   SVG to OM1 ;   AVR w/ #23 Mitralflow pericardial and ligation left atrial appendage;  MV repair w/ #28 Sorin 3-D memo Ring Annuloplasty with repair ruptured chordar of P-2 flail segments   CARDIAC CATHETERIZATION  05-12-2007  dr Leonia Reeves   single vessel 30% ostial LAD,  80% LCx;  severe to critial AS,  moderate MR,  normal LVF, ef 70%,  normal right heart pressures and cardiac outputs,  mild elevated LV end-diastolic pressure     CARDIOVASCULAR STRESS TEST  11-02-2010  dr Irish Lack   normal nuclear study w/ no ischemia or infarct/scar/  normal LV function and wall motion , ef 72%   CARDIOVERSION N/A 04/06/2014   Procedure: CARDIOVERSION;  Surgeon: [name], MD;  Location: [place];  Service: Cardiovascular;  Laterality: N/A;   successful   CATARACT EXTRACTION W/ INTRAOCULAR LENS  IMPLANT, BILATERAL  2012  approx   CORONARY ARTERY BYPASS GRAFT     PROSTATECTOMY     RETROPUBIC RADICAL PROSTATECTOMY  06/ 2000  in [place]   RIGHT/LEFT HEART CATH AND CORONARY/GRAFT ANGIOGRAPHY N/A 10/29/2018   Procedure: RIGHT/LEFT HEART CATH AND CORONARY/GRAFT ANGIOGRAPHY;  Surgeon: [name], MD;  Location: [place] [facility];  Service: Cardiovascular;  Laterality: N/A;   ROTATOR CUFF REPAIR Left 09/1998   TEE WITHOUT CARDIOVERSION N/A 04/06/2014   Procedure: TRANSESOPHAGEAL ECHOCARDIOGRAM (TEE);  Surgeon: [name], MD;  Location: [facility] ENDOSCOPY;  Service: Cardiovascular;  Laterality: N/A;  mild focal basa LVH of the septum, ef 50-55%, s/p AV annuloplasty ring, elongated chordae, thickened leaflets, mild to mod. MR, multiple small jets/arotic bioprosthetic valve sits well in position, mild AI/ thickened TV w/ mild reurg./mild LA   TEE WITHOUT CARDIOVERSION N/A 10/22/2018   Procedure: TRANSESOPHAGEAL  ECHOCARDIOGRAM (TEE);  Surgeon: [name], MD;  Location: [facility] ENDOSCOPY;  Service: Cardiovascular;  Laterality: N/A;   TEE WITHOUT CARDIOVERSION N/A 11/25/2018   Procedure: TRANSESOPHAGEAL ECHOCARDIOGRAM (TEE);  Surgeon: [name], MD;  Location: [place];  Service: Open Heart Surgery;  Laterality: N/A;   TRANSCATHETER AORTIC VALVE REPLACEMENT, TRANSFEMORAL N/A 11/25/2018   Procedure: TRANSCATHETER AORTIC VALVE REPLACEMENT, TRANSFEMORAL;  Surgeon: [name], MD;  Location: [place];  Service: Open Heart Surgery;  Laterality: N/A;   TRANSTHORACIC ECHOCARDIOGRAM  03-23-2016   dr Irish Lack   EF 55-60%, reduced contribution atrial contraction to ventricular filling, due to increased ventricular diastolic pressure or atrial contratile dysfunction/  bioprosthetic AV w/ mod. regurg. (valve area 1.97cm^2)/ mild dilated ascending aorta/ mild thicken MV normal function annular ring prosthesis/severe LAE & mod. to sev.RAE/ PASP 17mHg/ mild TR/ ventricle septal motion show paradox   TRANSURETHRAL RESECTION OF BLADDER TUMOR N/A 02/13/2016   Procedure: TRANSURETHRAL RESECTION OF BLADDER TUMOR (TURBT);  Surgeon: [name] MD;  [facility]  Service: Urology;  Laterality: N/A;   TRANSURETHRAL RESECTION OF BLADDER TUMOR N/A 09/04/2016   Procedure: TRANSURETHRAL RESECTION OF BLADDER TUMOR (TURBT);  Surgeon: [name] MD;  [facility]  Service: Urology;  Laterality: N/A;    There were no vitals filed for this visit.   Subjective Assessment - 05/30/21 1016     Subjective "Ok, good"    Currently in Pain? No/denies                               [facility] PT Treatment/Exercise - 05/30/21 0001       Knee/Hip Exercises: Aerobic   Nustep Level 5 x 6 min      Knee/Hip Exercises: Machines for Strengthening   Cybex Knee Extension 10lb 2x10, REL 5lb 2x10    Cybex Knee Flexion 35lb 3x10      Knee/Hip Exercises:  Standing   Heel Raises Both;2 sets;10 reps;2 seconds    Hip Flexion Stengthening;Both;2 sets;10 reps;Knee bent    Lateral Step Up Both;1 set;10 reps;Hand Hold: 0;Step Height: 4"    Other Standing Knee Exercises Shoulder Ext for cor green 2x10      Knee/Hip Exercises: Seated   Other Seated Knee/Hip Exercises 3lb hip abd/add driving simulation [id number]   Other Seated Knee/Hip Exercises Ankle DF green x 15 each    Sit to Sand 2 sets;10 reps;without UE support                       PT Short Term Goals - 05/30/21 1055       PT SHORT TERM GOAL #1   Title Pt  will be I and compliant with initial HEP.    Status Partially Met               PT Long Term Goals - 05/24/21 1638       PT LONG TERM GOAL #1   Title Pt will be I and compliant with HEP.    Time 12    Period Weeks    Status New      PT LONG TERM GOAL #2   Title Pt will improv BERG balance score 47/56 to show improved balance    Time 12    Period Weeks    Status New      PT LONG TERM GOAL #3   Title decrease TUG time to 12 seconds    Time 12    Period Weeks    Status New      PT LONG TERM GOAL #4   Title increase knee and ankle strength to 4+/5    Time 12    Period Weeks    Status New      PT LONG TERM GOAL #5   Title right ankle DF to 5 degrees actively    Time 12    Period Weeks    Status New                   Plan - 05/30/21 1057     Clinical Impression Statement Pt tolerated an initial progression to TE well evident no subjective reports of increase pain. LE weakness present with lateral step ups. Some LE weakness also present with seated hip abd/add with driving simulation. Cue for full ROM needed with leg extensions.    Rehab Potential Good    PT Frequency 2x / week    PT Duration 12 weeks    PT Treatment/Interventions ADLs/Self Care Home Management;Gait training;Neuromuscular re-education;Balance training;Therapeutic exercise;Therapeutic activities;Functional mobility  training;Stair training;Patient/family education;Manual techniques    PT Next Visit Plan work on right LE strength, work on balance and function             Patient will benefit from skilled therapeutic intervention in order to improve the following deficits and impairments:  Abnormal gait, Decreased coordination, Difficulty walking, Cardiopulmonary status limiting activity, Decreased endurance, Decreased activity tolerance, Decreased balance, Postural dysfunction, Decreased strength, Decreased mobility  Visit Diagnosis: Other abnormalities of gait and mobility  Muscle weakness (generalized)  Difficulty in walking, not elsewhere classified     Problem List Patient Active Problem List   Diagnosis Date Noted   Cerebral thrombosis with cerebral infarction 11/25/2020   Acute right-sided weakness 11/24/2020   History of mitral valve repair 11/26/2018   Prosthetic valve dysfunction 11/25/2018   Ectatic abdominal aorta ([place]) 11/25/2018   S/P valve-in-valve TAVR 11/25/2018   History of bladder cancer    History of pulmonary embolus (PE)    Acute on chronic diastolic heart failure ([place]) 04/23/2018   History of aortic valve replacement 04/23/2018   Atrial fibrillation ([place]) 04/23/2018   Coronary atherosclerosis of native coronary artery 02/25/2014   Hyperlipidemia 02/25/2014   Embolism and thrombosis ([place]) 03/11/2013    Scot Jun, PTA 05/30/2021, 11:00 AM  [place]. [place], [name], [address] Phone: [phone]   Fax:  [phone]  Name: Eric [name] MRN: [id number] Date of Birth: [birth year]

## 2021-06-01 ENCOUNTER — Encounter: Payer: Self-pay | Admitting: Physical Therapy

## 2021-06-01 ENCOUNTER — Other Ambulatory Visit: Payer: Self-pay

## 2021-06-01 ENCOUNTER — Ambulatory Visit: Payer: Medicare Other | Admitting: Physical Therapy

## 2021-06-01 DIAGNOSIS — R2689 Other abnormalities of gait and mobility: Secondary | ICD-10-CM | POA: Diagnosis not present

## 2021-06-01 DIAGNOSIS — R262 Difficulty in walking, not elsewhere classified: Secondary | ICD-10-CM

## 2021-06-01 DIAGNOSIS — M6281 Muscle weakness (generalized): Secondary | ICD-10-CM

## 2021-06-01 NOTE — Therapy (Signed)
[place]. [place], [name], [address] Phone: [phone]   Fax:  [phone]  Physical Therapy Treatment  Patient Details  Name: Eric [name] MRN: [id number] Date of Birth: 06/17/1929 Referring Provider (PT): Frann Rider   Encounter Date: 06/01/2021   PT End of Session - 06/01/21 0927     Visit Number 3    Date for PT Re-Evaluation 08/22/21    Authorization Type [employer] Medicare    PT Start Time 0845    PT Stop Time 0929    PT Time Calculation (min) 44 min    Activity Tolerance Patient tolerated treatment well    Behavior During Therapy [facility] for tasks assessed/performed             Past Medical History:  Diagnosis Date   Atrial flutter, paroxysmal ([place])    a. dx 04-02-2014, s/p  successful cardioversion 04-06-2014.   Chronic anticoagulation    on Eliquis--  due to recurrent dvt's and pe's   Dyslipidemia    History of bladder cancer urologist-  dr [name]   02-13-2016  s/p TURBT per path high grade papillary urothelial carcinoma   History of DVT (deep vein thrombosis)    2000-- RLE   History of melanoma excision    left flank;  07/ 2014 left lower leg and right upper chest   History of prostate cancer    Gleason 6--  s/p  radical prostatectomy 06/ 2000 in [place]---  no recurrence   History of pulmonary embolus (PE)    2000 & 2005  bilateral   Hx of valvuloplasty 06/19/2007   a. 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 05/2007 CABG x 1: s/p SVG-OM;  b. 10/2010 Ex MV: EF 72%, inf attenuation w/o ischemia, brief run of PAT with exercise. To   Stroke ([place])    Thrombocytopenia ([place])    Thyroid goiter 2013   nodular   Wears hearing aid    BILATERAL    Wears partial dentures    UPPER    Past Surgical History:  Procedure Laterality Date   AORTIC VALVE REPLACEMENT (AVR)/CORONARY ARTERY BYPASS GRAFTING (CABG)  06-19-2007  dr [name]   SVG to OM1 ;   AVR w/ #23 Mitralflow pericardial and ligation left atrial appendage;  MV repair w/ #28 Sorin 3-D memo Ring Annuloplasty with repair ruptured chordar of P-2 flail segments   CARDIAC CATHETERIZATION  05-12-2007  dr Leonia Reeves   single vessel 30% ostial LAD,  80% LCx;  severe to critial AS,  moderate MR,  normal LVF, ef 70%,  normal right heart pressures and cardiac outputs,  mild elevated LV end-diastolic pressure     CARDIOVASCULAR STRESS TEST  11-02-2010  dr Irish Lack   normal nuclear study w/ no ischemia or infarct/scar/  normal LV function and wall motion , ef 72%   CARDIOVERSION N/A 04/06/2014   Procedure: CARDIOVERSION;  Surgeon: [name], MD;  Location: [place];  Service: Cardiovascular;  Laterality: N/A;   successful   CATARACT EXTRACTION W/ INTRAOCULAR LENS  IMPLANT, BILATERAL  2012  approx   CORONARY ARTERY BYPASS GRAFT     PROSTATECTOMY     RETROPUBIC RADICAL PROSTATECTOMY  06/ 2000  in [place], [place]   RIGHT/LEFT HEART CATH AND CORONARY/GRAFT ANGIOGRAPHY N/A 10/29/2018   Procedure: RIGHT/LEFT HEART CATH AND CORONARY/GRAFT ANGIOGRAPHY;  Surgeon: [name], MD;  Location: [place] CV [facility];  Service: Cardiovascular;  Laterality: N/A;   ROTATOR CUFF REPAIR Left 09/1998   TEE WITHOUT CARDIOVERSION N/A 04/06/2014   Procedure: TRANSESOPHAGEAL ECHOCARDIOGRAM (TEE);  Surgeon: [name], MD;  Location: [facility] ENDOSCOPY;  Service: Cardiovascular;  Laterality: N/A;  mild focal basa LVH of the septum, ef 50-55%, s/p AV annuloplasty ring, elongated chordae, thickened leaflets, mild to mod. MR, multiple small jets/arotic bioprosthetic valve sits well in position, mild AI/ thickened TV w/ mild reurg./mild LA   TEE WITHOUT CARDIOVERSION N/A 10/22/2018   Procedure: TRANSESOPHAGEAL  ECHOCARDIOGRAM (TEE);  Surgeon: [name], MD;  Location: [facility] ENDOSCOPY;  Service: Cardiovascular;  Laterality: N/A;   TEE WITHOUT CARDIOVERSION N/A 11/25/2018   Procedure: TRANSESOPHAGEAL ECHOCARDIOGRAM (TEE);  Surgeon: [name], MD;  Location: [place];  Service: Open Heart Surgery;  Laterality: N/A;   TRANSCATHETER AORTIC VALVE REPLACEMENT, TRANSFEMORAL N/A 11/25/2018   Procedure: TRANSCATHETER AORTIC VALVE REPLACEMENT, TRANSFEMORAL;  Surgeon: [name], MD;  Location: [place];  Service: Open Heart Surgery;  Laterality: N/A;   TRANSTHORACIC ECHOCARDIOGRAM  03-23-2016   dr Irish Lack   EF 55-60%, reduced contribution atrial contraction to ventricular filling, due to increased ventricular diastolic pressure or atrial contratile dysfunction/  bioprosthetic AV w/ mod. regurg. (valve area 1.97cm^2)/ mild dilated ascending aorta/ mild thicken MV normal function annular ring prosthesis/severe LAE & mod. to sev.RAE/ PASP 73mmHg/ mild TR/ ventricle septal motion show paradox   TRANSURETHRAL RESECTION OF BLADDER TUMOR N/A 02/13/2016   Procedure: TRANSURETHRAL RESECTION OF BLADDER TUMOR (TURBT);  Surgeon: Nickie Retort, MD;  Location[facility];  Service: Urology;  Laterality: N/A;   TRANSURETHRAL RESECTION OF BLADDER TUMOR N/A 09/04/2016   Procedure: TRANSURETHRAL RESECTION OF BLADDER TUMOR (TURBT);  Surgeon: Nickie Retort, MD;  Location: [facility];  Service: Urology;  Laterality: N/A;    There were no vitals filed for this visit.   Subjective Assessment - 06/01/21 0848     Subjective "Im doing well"    Currently in Pain? No/denies                               [facility] Adult PT Treatment/Exercise - 06/01/21 0001       Knee/Hip Exercises: Aerobic   Nustep Level 5 x 6 min      Knee/Hip Exercises: Machines for Strengthening   Cybex Leg Press 20lb single leg 2x10 each      Knee/Hip Exercises: Standing   Hip Abduction  Stengthening;Both;2 sets;10 reps    Lateral Step Up Both;1 set;10 reps;Hand Hold: 0;Step Height: 4"    Walking with Sports Cord 40lb 4 way x 5 each    Other Standing Knee Exercises Shoulder Ext for core 10lb 2x10      Knee/Hip Exercises: Seated   Other Seated Knee/Hip Exercises 3lb hip abd/add driving simulation 9T26    Other Seated Knee/Hip Exercises Ankle DF green x 15 each    Sit to Sand 2 sets;10 reps;without UE support;Other (comment)   on airex, mat slightly elevated                      PT Short Term Goals - 06/01/21 0929       PT SHORT TERM GOAL #1  Title Pt will be I and compliant with initial HEP.    Status Achieved               PT Long Term Goals - 05/24/21 1638       PT LONG TERM GOAL #1   Title Pt will be I and compliant with HEP.    Time 12    Period Weeks    Status New      PT LONG TERM GOAL #2   Title Pt will improv BERG balance score 47/56 to show improved balance    Time 12    Period Weeks    Status New      PT LONG TERM GOAL #3   Title decrease TUG time to 12 seconds    Time 12    Period Weeks    Status New      PT LONG TERM GOAL #4   Title increase knee and ankle strength to 4+/5    Time 12    Period Weeks    Status New      PT LONG TERM GOAL #5   Title right ankle DF to 5 degrees actively    Time 12    Period Weeks    Status New                   Plan - 06/01/21 3710     Clinical Impression Statement Pt did well overall today, decrease eccentric control with eccentric phase of resisted gait. Some LOB with lateral step ups, LE would occasionally clip the box when standing up. Some initial instability with sit to stands utilizing airex under feet. Good strength on leg press. Cue for core engagement needed with shoulder extensions.    Stability/Clinical Decision Making Stable/Uncomplicated    Rehab Potential Good    PT Frequency 2x / week    PT Duration 12 weeks    PT Treatment/Interventions ADLs/Self Care  Home Management;Gait training;Neuromuscular re-education;Balance training;Therapeutic exercise;Therapeutic activities;Functional mobility training;Stair training;Patient/family education;Manual techniques    PT Next Visit Plan work on right LE strength, work on balance and function             Patient will benefit from skilled therapeutic intervention in order to improve the following deficits and impairments:  Abnormal gait, Decreased coordination, Difficulty walking, Cardiopulmonary status limiting activity, Decreased endurance, Decreased activity tolerance, Decreased balance, Postural dysfunction, Decreased strength, Decreased mobility  Visit Diagnosis: Other abnormalities of gait and mobility  Difficulty in walking, not elsewhere classified  Muscle weakness (generalized)     Problem List Patient Active Problem List   Diagnosis Date Noted   Cerebral thrombosis with cerebral infarction 11/25/2020   Acute right-sided weakness 11/24/2020   History of mitral valve repair 11/26/2018   Prosthetic valve dysfunction 11/25/2018   Ectatic abdominal aorta ([place]) 11/25/2018   S/P valve-in-valve TAVR 11/25/2018   History of bladder cancer    History of pulmonary embolus (PE)    Acute on chronic diastolic heart failure ([place]) 04/23/2018   History of aortic valve replacement 04/23/2018   Atrial fibrillation ([place]) 04/23/2018   Coronary atherosclerosis of native coronary artery 02/25/2014   Hyperlipidemia 02/25/2014   Embolism and thrombosis ([place]) 03/11/2013    Scot Jun, PTA 06/01/2021, 9:30 AM  [place]. [place], [name], [address] Phone: [phone]   Fax:  [phone]  Name: KORDAE BUONOCORE MRN: [id number] Date of Birth: 30-Jun-1929

## 2021-06-06 ENCOUNTER — Ambulatory Visit: Payer: Medicare Other | Admitting: Physical Therapy

## 2021-06-06 ENCOUNTER — Other Ambulatory Visit: Payer: Self-pay

## 2021-06-06 ENCOUNTER — Encounter: Payer: Self-pay | Admitting: Physical Therapy

## 2021-06-06 DIAGNOSIS — R2689 Other abnormalities of gait and mobility: Secondary | ICD-10-CM | POA: Diagnosis not present

## 2021-06-06 DIAGNOSIS — M6281 Muscle weakness (generalized): Secondary | ICD-10-CM

## 2021-06-06 DIAGNOSIS — R262 Difficulty in walking, not elsewhere classified: Secondary | ICD-10-CM

## 2021-06-06 DIAGNOSIS — R278 Other lack of coordination: Secondary | ICD-10-CM

## 2021-06-06 NOTE — Therapy (Signed)
[place]. [place], [name], [address] Phone: [phone]   Fax:  [phone]  Physical Therapy Treatment  Patient Details  Name: Eric [name] MRN: [id number] Date of Birth: 1930-01-21 Referring Provider (PT): Frann Rider   Encounter Date: 06/06/2021   PT End of Session - 06/06/21 0931     Visit Number 4    Date for PT Re-Evaluation 08/22/21    Authorization Type UHC Medicare    PT Start Time 0845    PT Stop Time 0930    PT Time Calculation (min) 45 min    Activity Tolerance Patient tolerated treatment well    Behavior During Therapy [facility] for tasks assessed/performed             Past Medical History:  Diagnosis Date   Atrial flutter, paroxysmal ([place])    a. dx 04-02-2014, s/p  successful cardioversion 04-06-2014.   Chronic anticoagulation    on Eliquis--  due to recurrent dvt's and pe's   Dyslipidemia    History of bladder cancer urologist-  dr [name]   02-13-2016  s/p TURBT per path high grade papillary urothelial carcinoma   History of DVT (deep vein thrombosis)    2000-- RLE   History of melanoma excision    left flank;  07/ 2014 left lower leg and right upper chest   History of prostate cancer    Gleason 6--  s/p  radical prostatectomy 06/ 2000 in [place]---  no recurrence   History of pulmonary embolus (PE)    2000 & 2005  bilateral   Hx of valvuloplasty 06/19/2007   a. 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 05/2007 CABG x 1: s/p SVG-OM;  b. 10/2010 Ex MV: EF 72%, inf attenuation w/o ischemia, brief run of PAT with exercise. To   Stroke ([place])    Thrombocytopenia ([place])    Thyroid goiter 2013   nodular   Wears hearing aid    BILATERAL    Wears partial dentures    UPPER    Past Surgical History:  Procedure Laterality Date   AORTIC VALVE REPLACEMENT (AVR)/CORONARY ARTERY BYPASS GRAFTING (CABG)  06-19-2007  dr [name]   SVG to OM1 ;   AVR w/ #23 Mitralflow pericardial and ligation left atrial appendage;  MV repair w/ #28 Sorin 3-D memo Ring Annuloplasty with repair ruptured chordar of P-2 flail segments   CARDIAC CATHETERIZATION  05-12-2007  dr Leonia Reeves   single vessel 30% ostial LAD,  80% LCx;  severe to critial AS,  moderate MR,  normal LVF, ef 70%,  normal right heart pressures and cardiac outputs,  mild elevated LV end-diastolic pressure     CARDIOVASCULAR STRESS TEST  11-02-2010  dr Irish Lack   normal nuclear study w/ no ischemia or infarct/scar/  normal LV function and wall motion , ef 72%   CARDIOVERSION N/A 04/06/2014   Procedure: CARDIOVERSION;  Surgeon: [name], MD;  Location: [place];  Service: Cardiovascular;  Laterality: N/A;   successful   CATARACT EXTRACTION W/ INTRAOCULAR LENS  IMPLANT, BILATERAL  2012  approx   CORONARY ARTERY BYPASS GRAFT     PROSTATECTOMY     RETROPUBIC RADICAL PROSTATECTOMY  06/ 2000  in [place], [place]   RIGHT/LEFT HEART CATH AND CORONARY/GRAFT ANGIOGRAPHY N/A 10/29/2018   Procedure: RIGHT/LEFT HEART CATH AND CORONARY/GRAFT ANGIOGRAPHY;  Surgeon: [name], MD;  Location: [place] [facility];  Service: Cardiovascular;  Laterality: N/A;   ROTATOR CUFF REPAIR Left 09/1998   TEE WITHOUT CARDIOVERSION N/A 04/06/2014   Procedure: TRANSESOPHAGEAL ECHOCARDIOGRAM (TEE);  Surgeon: [name], MD;  Location: [facility] ENDOSCOPY;  Service: Cardiovascular;  Laterality: N/A;  mild focal basa LVH of the septum, ef 50-55%, s/p AV annuloplasty ring, elongated chordae, thickened leaflets, mild to mod. MR, multiple small jets/arotic bioprosthetic valve sits well in position, mild AI/ thickened TV w/ mild reurg./mild LA   TEE WITHOUT CARDIOVERSION N/A 10/22/2018   Procedure: TRANSESOPHAGEAL  ECHOCARDIOGRAM (TEE);  Surgeon: [name], MD;  Location: [facility] ENDOSCOPY;  Service: Cardiovascular;  Laterality: N/A;   TEE WITHOUT CARDIOVERSION N/A 11/25/2018   Procedure: TRANSESOPHAGEAL ECHOCARDIOGRAM (TEE);  Surgeon: [name], MD;  Location: [place];  Service: Open Heart Surgery;  Laterality: N/A;   TRANSCATHETER AORTIC VALVE REPLACEMENT, TRANSFEMORAL N/A 11/25/2018   Procedure: TRANSCATHETER AORTIC VALVE REPLACEMENT, TRANSFEMORAL;  Surgeon: [name], MD;  Location: [place];  Service: Open Heart Surgery;  Laterality: N/A;   TRANSTHORACIC ECHOCARDIOGRAM  03-23-2016   dr Irish Lack   EF 55-60%, reduced contribution atrial contraction to ventricular filling, due to increased ventricular diastolic pressure or atrial contratile dysfunction/  bioprosthetic AV w/ mod. regurg. (valve area 1.97cm^2)/ mild dilated ascending aorta/ mild thicken MV normal function annular ring prosthesis/severe LAE & mod. to sev.RAE/ PASP 70mmHg/ mild TR/ ventricle septal motion show paradox   TRANSURETHRAL RESECTION OF BLADDER TUMOR N/A 02/13/2016   Procedure: TRANSURETHRAL RESECTION OF BLADDER TUMOR (TURBT);  Surgeon: Nickie Retort, MD;  Location: [facility];  Service: Urology;  Laterality: N/A;   TRANSURETHRAL RESECTION OF BLADDER TUMOR N/A 09/04/2016   Procedure: TRANSURETHRAL RESECTION OF BLADDER TUMOR (TURBT);  Surgeon: Nickie Retort, MD;  Location: [facility];  Service: Urology;  Laterality: N/A;    There were no vitals filed for this visit.   Subjective Assessment - 06/06/21 0848     Subjective Going good, no falls    Currently in Pain? No/denies                               [facility] Adult PT Treatment/Exercise - 06/06/21 0001       High Level Balance   High Level Balance Comments Sides step over half foam roll      Knee/Hip Exercises: Aerobic   Nustep Level 5 x 6 min      Knee/Hip Exercises: Machines for Strengthening   Cybex  Knee Extension 10lb 2x10    Cybex Knee Flexion 35lb 2x10    Cybex Leg Press 50lb 2x10      Knee/Hip Exercises: Standing   Heel Raises Both;2 sets;10 reps;2 seconds    Lateral Step Up Both;10 reps;Hand Hold: 0;2 sets;Step Height: 6"    Walking with Sports Cord 40lb side steps x 5 each    Other Standing Knee Exercises Alt 6in box taps 2x10                       PT Short Term Goals - 06/06/21 0932       PT SHORT TERM GOAL #1   Title Pt will be I and compliant with initial HEP.    Status Achieved  PT Long Term Goals - 05/24/21 1638       PT LONG TERM GOAL #1   Title Pt will be I and compliant with HEP.    Time 12    Period Weeks    Status New      PT LONG TERM GOAL #2   Title Pt will improv BERG balance score 47/56 to show improved balance    Time 12    Period Weeks    Status New      PT LONG TERM GOAL #3   Title decrease TUG time to 12 seconds    Time 12    Period Weeks    Status New      PT LONG TERM GOAL #4   Title increase knee and ankle strength to 4+/5    Time 12    Period Weeks    Status New      PT LONG TERM GOAL #5   Title right ankle DF to 5 degrees actively    Time 12    Period Weeks    Status New                   Plan - 06/06/21 0932     Clinical Impression Statement Despite being able to complete all interventions Mr. Rampy had a difficult time with all balance interventions. Multiple bouts of LOB noted with lateral step ups, side step, and with alt box taps. Pt does have some righting reaction but does re correct min to mod assist to regain balance. Cue for full ROM needed with leg extensions an leg press.    Stability/Clinical Decision Making Stable/Uncomplicated    Rehab Potential Good    PT Frequency 2x / week    PT Duration 12 weeks    PT Treatment/Interventions ADLs/Self Care Home Management;Gait training;Neuromuscular re-education;Balance training;Therapeutic exercise;Therapeutic  activities;Functional mobility training;Stair training;Patient/family education;Manual techniques    PT Next Visit Plan work on right LE strength, work on balance and function             Patient will benefit from skilled therapeutic intervention in order to improve the following deficits and impairments:  Abnormal gait, Decreased coordination, Difficulty walking, Cardiopulmonary status limiting activity, Decreased endurance, Decreased activity tolerance, Decreased balance, Postural dysfunction, Decreased strength, Decreased mobility  Visit Diagnosis: Other abnormalities of gait and mobility  Difficulty in walking, not elsewhere classified  Muscle weakness (generalized)  Other lack of coordination     Problem List Patient Active Problem List   Diagnosis Date Noted   Cerebral thrombosis with cerebral infarction 11/25/2020   Acute right-sided weakness 11/24/2020   History of mitral valve repair 11/26/2018   Prosthetic valve dysfunction 11/25/2018   Ectatic abdominal aorta ([place]) 11/25/2018   S/P valve-in-valve TAVR 11/25/2018   History of bladder cancer    History of pulmonary embolus (PE)    Acute on chronic diastolic heart failure ([place]) 04/23/2018   History of aortic valve replacement 04/23/2018   Atrial fibrillation ([place]) 04/23/2018   Coronary atherosclerosis of native coronary artery 02/25/2014   Hyperlipidemia 02/25/2014   Embolism and thrombosis ([place]) 03/11/2013    Scot Jun, PTA 06/06/2021, 9:35 AM  [place]. [place], [name], [address] Phone: [phone]   Fax:  [phone]  Name: Eric [name] MRN: [id number] Date of Birth: 1929-12-24

## 2021-06-08 ENCOUNTER — Ambulatory Visit: Payer: Medicare Other | Admitting: Physical Therapy

## 2021-06-08 ENCOUNTER — Encounter: Payer: Self-pay | Admitting: Physical Therapy

## 2021-06-08 ENCOUNTER — Other Ambulatory Visit: Payer: Self-pay

## 2021-06-08 DIAGNOSIS — R2689 Other abnormalities of gait and mobility: Secondary | ICD-10-CM

## 2021-06-08 DIAGNOSIS — R262 Difficulty in walking, not elsewhere classified: Secondary | ICD-10-CM

## 2021-06-08 DIAGNOSIS — M6281 Muscle weakness (generalized): Secondary | ICD-10-CM

## 2021-06-08 NOTE — Therapy (Signed)
[place]. [place], [name], [address] Phone: [phone]   Fax:  [phone]  Physical Therapy Treatment  Patient Details  Name: Eric [name] MRN: [id number] Date of Birth: 10/10/1929 Referring Provider (PT): Frann Rider   Encounter Date: 06/08/2021   PT End of Session - 06/08/21 0925     Visit Number 5    Date for PT Re-Evaluation 08/22/21    Authorization Type U[employer] Medicare    PT Start Time 0845    PT Stop Time 0930    PT Time Calculation (min) 45 min    Activity Tolerance Patient tolerated treatment well    Behavior During Therapy [facility] for tasks assessed/performed             Past Medical History:  Diagnosis Date   Atrial flutter, paroxysmal ([place])    a. dx 04-02-2014, s/p  successful cardioversion 04-06-2014.   Chronic anticoagulation    on Eliquis--  due to recurrent dvt's and pe's   Dyslipidemia    History of bladder cancer urologist-  dr [name]   02-13-2016  s/p TURBT per path high grade papillary urothelial carcinoma   History of DVT (deep vein thrombosis)    2000-- RLE   History of melanoma excision    left flank;  07/ 2014 left lower leg and right upper chest   History of prostate cancer    Gleason 6--  s/p  radical prostatectomy 06/ 2000 in [place]---  no recurrence   History of pulmonary embolus (PE)    2000 & 2005  bilateral   Hx of valvuloplasty 06/19/2007   a. 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 05/2007 CABG x 1: s/p SVG-OM;  b. 10/2010 Ex MV: EF 72%, inf attenuation w/o ischemia, brief run of PAT with exercise. To   Stroke ([place])    Thrombocytopenia ([place])    Thyroid goiter 2013   nodular   Wears hearing aid    BILATERAL    Wears partial dentures    UPPER    Past Surgical History:  Procedure Laterality Date   AORTIC VALVE REPLACEMENT (AVR)/CORONARY ARTERY BYPASS GRAFTING (CABG)  06-19-2007  dr [name]   SVG to OM1 ;   AVR w/ #23 Mitralflow pericardial and ligation left atrial appendage;  MV repair w/ #28 Sorin 3-D memo Ring Annuloplasty with repair ruptured chordar of P-2 flail segments   CARDIAC CATHETERIZATION  05-12-2007  dr Leonia Reeves   single vessel 30% ostial LAD,  80% LCx;  severe to critial AS,  moderate MR,  normal LVF, ef 70%,  normal right heart pressures and cardiac outputs,  mild elevated LV end-diastolic pressure     CARDIOVASCULAR STRESS TEST  11-02-2010  dr Irish Lack   normal nuclear study w/ no ischemia or infarct/scar/  normal LV function and wall motion , ef 72%   CARDIOVERSION N/A 04/06/2014   Procedure: CARDIOVERSION;  Surgeon: [name], MD;  Location: [place];  Service: Cardiovascular;  Laterality: N/A;   successful   CATARACT EXTRACTION W/ INTRAOCULAR LENS  IMPLANT, BILATERAL  2012  approx   CORONARY ARTERY BYPASS GRAFT     PROSTATECTOMY     RETROPUBIC RADICAL PROSTATECTOMY  06/ 2000  in [place]   RIGHT/LEFT HEART CATH AND CORONARY/GRAFT ANGIOGRAPHY N/A 10/29/2018   Procedure: RIGHT/LEFT HEART CATH AND CORONARY/GRAFT ANGIOGRAPHY;  Surgeon: [name], MD;  Location: [place] CV LAB;  Service: Cardiovascular;  Laterality: N/A;   ROTATOR CUFF REPAIR Left 09/1998   TEE WITHOUT CARDIOVERSION N/A 04/06/2014   Procedure: TRANSESOPHAGEAL ECHOCARDIOGRAM (TEE);  Surgeon: [name], MD;  Location: [facility] ENDOSCOPY;  Service: Cardiovascular;  Laterality: N/A;  mild focal basa LVH of the septum, ef 50-55%, s/p AV annuloplasty ring, elongated chordae, thickened leaflets, mild to mod. MR, multiple small jets/arotic bioprosthetic valve sits well in position, mild AI/ thickened TV w/ mild reurg./mild LA   TEE WITHOUT CARDIOVERSION N/A 10/22/2018   Procedure: TRANSESOPHAGEAL  ECHOCARDIOGRAM (TEE);  Surgeon: [name], MD;  Location: [facility] ENDOSCOPY;  Service: Cardiovascular;  Laterality: N/A;   TEE WITHOUT CARDIOVERSION N/A 11/25/2018   Procedure: TRANSESOPHAGEAL ECHOCARDIOGRAM (TEE);  Surgeon: [name], MD;  Location: [place];  Service: Open Heart Surgery;  Laterality: N/A;   TRANSCATHETER AORTIC VALVE REPLACEMENT, TRANSFEMORAL N/A 11/25/2018   Procedure: TRANSCATHETER AORTIC VALVE REPLACEMENT, TRANSFEMORAL;  Surgeon: [name], MD;  Location: [place];  Service: Open Heart Surgery;  Laterality: N/A;   TRANSTHORACIC ECHOCARDIOGRAM  03-23-2016   dr Irish Lack   EF 55-60%, reduced contribution atrial contraction to ventricular filling, due to increased ventricular diastolic pressure or atrial contratile dysfunction/  bioprosthetic AV w/ mod. regurg. (valve area 1.97cm^2)/ mild dilated ascending aorta/ mild thicken MV normal function annular ring prosthesis/severe LAE & mod. to sev.RAE/ PASP 40mHg/ mild TR/ ventricle septal motion show paradox   TRANSURETHRAL RESECTION OF BLADDER TUMOR N/A 02/13/2016   Procedure: TRANSURETHRAL RESECTION OF BLADDER TUMOR (TURBT);  Surgeon: [name] MD;  [facility]  Service: Urology;  Laterality: N/A;   TRANSURETHRAL RESECTION OF BLADDER TUMOR N/A 09/04/2016   Procedure: TRANSURETHRAL RESECTION OF BLADDER TUMOR (TURBT);  Surgeon: [name] MD;  [facility]  Service: Urology;  Laterality: N/A;    There were no vitals filed for this visit.   Subjective Assessment - 06/08/21 0850     Subjective "Good"    Currently in Pain? No/denies                               [facility] Adult PT Treatment/Exercise - 06/08/21 0001       Neuro Re-ed    Neuro Re-ed Details  On airex ball toss      Knee/Hip Exercises: Aerobic   Recumbent Bike L3 x 3 min    Nustep Level 5 x 6 min      Knee/Hip Exercises: Machines for Strengthening   Cybex Knee  Extension 10lb 2x10    Cybex Knee Flexion 35lb 2x10      Knee/Hip Exercises: Standing   Lateral Step Up Both;10 reps;Hand Hold: 0;Step Height: 6";1 set    Walking with Sports Cord 40lb 4 way x 5 each    Other Standing Knee Exercises Step ups on airex x5 each      Knee/Hip Exercises: Seated   Sit to Sand 2 sets;10 reps;without UE support;Other (comment)   On airex, slightly elevated mat                      PT Short Term Goals - 06/06/21 0932       PT SHORT TERM GOAL #1   Title Pt will  be I and compliant with initial HEP.    Status Achieved               PT Long Term Goals - 06/08/21 5726       PT LONG TERM GOAL #1   Title Pt will be I and compliant with HEP.    Status Partially Met      PT LONG TERM GOAL #2   Title Pt will improv BERG balance score 47/56 to show improved balance      PT LONG TERM GOAL #3   Title decrease TUG time to 12 seconds    Status On-going      PT LONG TERM GOAL #4   Title increase knee and ankle strength to 4+/5    Status On-going      PT LONG TERM GOAL #5   Title right ankle DF to 5 degrees actively    Status On-going                   Plan - 06/08/21 0925     Clinical Impression Statement Pt did well overall in today's session. Cue to pick up feet with resisted side steps. Pt tends to bear a lot of weight in his heels when standing, frequent posterior LOB with step ups requiring min assist to correct. PT would often clip feet on box with forward and lateral step ups. Difficulty maintaining stability with ball toss on airex.    Stability/Clinical Decision Making Stable/Uncomplicated    Rehab Potential Good    PT Frequency 2x / week    PT Duration 12 weeks    PT Treatment/Interventions ADLs/Self Care Home Management;Gait training;Neuromuscular re-education;Balance training;Therapeutic exercise;Therapeutic activities;Functional mobility training;Stair training;Patient/family education;Manual techniques    PT Next  Visit Plan work on right LE strength, work on balance and function             Patient will benefit from skilled therapeutic intervention in order to improve the following deficits and impairments:  Abnormal gait, Decreased coordination, Difficulty walking, Cardiopulmonary status limiting activity, Decreased endurance, Decreased activity tolerance, Decreased balance, Postural dysfunction, Decreased strength, Decreased mobility  Visit Diagnosis: Other abnormalities of gait and mobility  Difficulty in walking, not elsewhere classified  Muscle weakness (generalized)     Problem List Patient Active Problem List   Diagnosis Date Noted   Cerebral thrombosis with cerebral infarction 11/25/2020   Acute right-sided weakness 11/24/2020   History of mitral valve repair 11/26/2018   Prosthetic valve dysfunction 11/25/2018   Ectatic abdominal aorta ([place]) 11/25/2018   S/P valve-in-valve TAVR 11/25/2018   History of bladder cancer    History of pulmonary embolus (PE)    Acute on chronic diastolic heart failure ([place]) 04/23/2018   History of aortic valve replacement 04/23/2018   Atrial fibrillation ([place]) 04/23/2018   Coronary atherosclerosis of native coronary artery 02/25/2014   Hyperlipidemia 02/25/2014   Embolism and thrombosis ([place]) 03/11/2013    Scot Jun, PTA 06/08/2021, 9:28 AM  [place]. [place], [name], [address] Phone: [phone]   Fax:  [phone]  Name: Eric [name] MRN: [id number] Date of Birth: [birth year]

## 2021-06-13 ENCOUNTER — Other Ambulatory Visit: Payer: Self-pay

## 2021-06-13 ENCOUNTER — Ambulatory Visit: Payer: Medicare Other | Admitting: Physical Therapy

## 2021-06-13 ENCOUNTER — Encounter: Payer: Self-pay | Admitting: Physical Therapy

## 2021-06-13 DIAGNOSIS — R2689 Other abnormalities of gait and mobility: Secondary | ICD-10-CM

## 2021-06-13 DIAGNOSIS — M6281 Muscle weakness (generalized): Secondary | ICD-10-CM

## 2021-06-13 DIAGNOSIS — R262 Difficulty in walking, not elsewhere classified: Secondary | ICD-10-CM

## 2021-06-13 NOTE — Progress Notes (Signed)
Cardiology Office Note   Date:  06/14/2021   ID:  Eric [name], DOB 31-Jul-1929, MRN [id number]  PCP:  [name], MD    Chief Complaint  Patient presents with   Follow-up   CAD, s/p AVR  Wt Readings from Last 3 Encounters:  06/14/21 192 lb (87.1 kg)  05/09/21 185 lb (83.9 kg)  01/03/21 189 lb (85.7 kg)       History of Present Illness: Eric [name] is a 88 y.o. male  with severe valvular heart disease.   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   He has had an aortic valve replacement , MV repair and single vessel bypass in 2009.  He had a negative stress test in 2011. No chest pain prior to valve replacement surgery. He did have SHOB.   In late 2015, his heart rate was noted to be elevated at the time of a tooth extraction. He was found to be in atrial flutter. He underwent successful cardioversion. He notes that during the time he was out of rhythm, he had a cough. This got better after the cardioversion.   He went to [place] in February 2016 for about 3 weeks and did well on the NOAC.  Less bleeding and bruising on Eliquis compared to his experience Coumadin.  He then changed from Coumadin to Eliquis to treat multiple DVT and PE.   He had a tooth extraction years ago. When he stayed on his anticoagulation, he had significant bleeding issues at that time. THe next time, he came off of the Eliquis.   In late 2017, he had several instances of blood clots in his urine. He underwent cystoscopy in the urology clinic after holding out was for 4 days. This showed a malignancy. He had surgery and tolerated this well.   He has noticed more edema in his right leg since surgery back in 2017.  It persists.    In late 2019, he was in [place] and was volume overloaded and short of breath. He was given IV Lasix and diuresed well. He was kept overnight.  He was also found to be in AFib.  He dd not feel  Palpitations.     They were eating out all of the time.   He had stopped his Lasix with his bladder cancer.  Echo showed normal LV function and 3+ AI.     In early 2020, He had a procedure on the left ear.  It was bleeding and it was cauterized.  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       I spoke to his PMD, Dr. Laurann Montana in 07/2018.  His labs revealed concern for low grade hemolysis.  TEE was considered but the patient was in [place], and given the COVID outbreak, the procedure was postponed.     He required repeat surgical procedure on the squamous cell area.  He had some bleeding post procedure and required more stitches.  He required a skin graft.     TEE was done in 09/2018:  The left ventricle has low normal systolic function, with an ejection fraction of 50-55%. Left ventricular diastolic function could not be evaluated. No evidence of left ventricular regional wall motion abnormalities.  2. The right ventricle has normal systolc function. The cavity was normal. There is no increase in right ventricular wall thickness.  3. Left atrial size was mildly dilated.  4. Right atrial size was mildly dilated.  5. Moderately thickened tricuspid valve leaflets.  6. 29m Sorin mitral valve repair annular ring intact.  7. 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  8. A 28 Mitralflow porcine bioprosthesis valve is present in the aortic position. Echo findings are consistent with intravalvular regurgitation of the aortic prosthesis.  9. Aortic valve regurgitation is severe by color flow Doppler. Mild stenosis of the aortic valve. 10. No vegetation on the aortic valve. 11. The aortic root is normal in size and structure. 12. Previously ligated left atrial appendage. 13. Severe bioprosthetic aortic valve regurgitation.   D/w Dr. CBurt Knackwith plans for TAVR w/u.   POst TAVR f/u showed: "He was evaluated by the multidisciplinary valve team and underwent successful valve in valve TAVR with a 23 mm Edwards Sapien 3 THV via the  TF approach on 11/25/18. Post operative echo showed EF 50-55%, severe MAC with mild MS, mild-mod TR and normally functioning TAVR with mean gradient 10.565mg and no PVL. His BP was noted to be elevated post operatively and he was started on Losartan 2591maily. He was discharged on home Eliquis and aspirin. Lasix decreased from 77m2mily to 20mg55mly.    Follow up labs showed increase in creat 1.05--> 1.44. Lasix decreased to 20mg 77my other day. Bilirubin was still elevated and this was sent to his PCP for review."   He has had some dental issues.  He had to have a tooth pulled in 03/2019.  Eliquis was continued.     He is in a balance class also, 2x/week.  Fell while playing pickle ball.   TAVR clinic visit in 7/21, things were stable.   Had some low BP in 10/21.  Holding losartan when BP is around 130 mm Hg. BP has been in the [id number] systolic since then.   CVA in 11/2020 which was from small vessel disease, while on Eliquis. Losartan has been on hold.  Aspirin was added after stroke per neuro.      Past Medical History:  Diagnosis Date   Atrial flutter, paroxysmal ([facility])  [place]. dx 04-02-2014, s/p  successful cardioversion 04-06-2014.   Chronic anticoagulation    on Eliquis--  due to recurrent dvt's and pe's   Dyslipidemia    History of bladder cancer urologist-  dr budzynPilar Jarvis25-2017  s/p TURBT per path high grade papillary urothelial carcinoma   History of DVT (deep vein thrombosis)    2000-- RLE   History of melanoma excision    left flank;  07/ 2014 left lower leg and right upper chest   History of prostate cancer    Gleason 6--  s/p  radical prostatectomy 06/ 2000 in [place]---  no recurrence   History of pulmonary embolus (PE)    2000 & 2005  bilateral   Hx of valvuloplasty 06/19/2007   a. s/p mitral ring annuloplasty, repair ruptured chordae of P2 flail segments of MV   S/P aortic valve replacement with bioprosthetic valve    06-19-2007  severe aortic valve  stenosis   S/P valve-in-valve TAVR 11/25/2018   Single vessel coronary artery disease   cardiologist-  dr varanaIrish Lack1/2009 CABG x 1: s/p SVG-OM;  b. 10/2010 Ex MV: EF 72%, inf attenuation w/o ischemia, brief run of PAT with exercise. To   [facility]  [place]   nodular   Wears hearing aid    BILATERAL   Wears partial dentures    UPPER  Past Surgical History:  Procedure Laterality Date   AORTIC VALVE REPLACEMENT (AVR)/CORONARY ARTERY BYPASS GRAFTING (CABG)  06-19-2007  dr [name]   SVG to OM1 ;   AVR w/ #23 Mitralflow pericardial and ligation left atrial appendage;  MV repair w/ #28 Sorin 3-D memo Ring Annuloplasty with repair ruptured chordar of P-2 flail segments   CARDIAC CATHETERIZATION  05-12-2007  dr Leonia Reeves   single vessel 30% ostial LAD,  80% LCx;  severe to critial AS,  moderate MR,  normal LVF, ef 70%,  normal right heart pressures and cardiac outputs,  mild elevated LV end-diastolic pressure     CARDIOVASCULAR STRESS TEST  11-02-2010  dr Irish Lack   normal nuclear study w/ no ischemia or infarct/scar/  normal LV function and wall motion , ef 72%   CARDIOVERSION N/A 04/06/2014   Procedure: CARDIOVERSION;  Surgeon: [name], MD;  Location: [place];  Service: Cardiovascular;  Laterality: N/A;   successful   CATARACT EXTRACTION W/ INTRAOCULAR LENS  IMPLANT, BILATERAL  2012  approx   CORONARY ARTERY BYPASS GRAFT     PROSTATECTOMY     RETROPUBIC RADICAL PROSTATECTOMY  06/ 2000  in [place], [place]   RIGHT/LEFT HEART CATH AND CORONARY/GRAFT ANGIOGRAPHY N/A 10/29/2018   Procedure: RIGHT/LEFT HEART CATH AND CORONARY/GRAFT ANGIOGRAPHY;  Surgeon: [name], MD;  Location: [place] CV LAB;  Service: Cardiovascular;  Laterality: N/A;   ROTATOR CUFF REPAIR Left 09/1998   TEE WITHOUT CARDIOVERSION N/A 04/06/2014   Procedure: TRANSESOPHAGEAL ECHOCARDIOGRAM (TEE);  Surgeon: [name], MD;  Location: [facility] ENDOSCOPY;  Service:  Cardiovascular;  Laterality: N/A;  mild focal basa LVH of the septum, ef 50-55%, s/p AV annuloplasty ring, elongated chordae, thickened leaflets, mild to mod. MR, multiple small jets/arotic bioprosthetic valve sits well in position, mild AI/ thickened TV w/ mild reurg./mild LA   TEE WITHOUT CARDIOVERSION N/A 10/22/2018   Procedure: TRANSESOPHAGEAL ECHOCARDIOGRAM (TEE);  Surgeon: [name], MD;  Location: [facility] ENDOSCOPY;  Service: Cardiovascular;  Laterality: N/A;   TEE WITHOUT CARDIOVERSION N/A 11/25/2018   Procedure: TRANSESOPHAGEAL ECHOCARDIOGRAM (TEE);  Surgeon: [name], MD;  Location: [place];  Service: Open Heart Surgery;  Laterality: N/A;   TRANSCATHETER AORTIC VALVE REPLACEMENT, TRANSFEMORAL N/A 11/25/2018   Procedure: TRANSCATHETER AORTIC VALVE REPLACEMENT, TRANSFEMORAL;  Surgeon: [name], MD;  Location: [place];  Service: Open Heart Surgery;  Laterality: N/A;   TRANSTHORACIC ECHOCARDIOGRAM  03-23-2016   dr Irish Lack   EF 55-60%, reduced contribution atrial contraction to ventricular filling, due to increased ventricular diastolic pressure or atrial contratile dysfunction/  bioprosthetic AV w/ mod. regurg. (valve area 1.97cm^2)/ mild dilated ascending aorta/ mild thicken MV normal function annular ring prosthesis/severe LAE & mod. to sev.RAE/ PASP 65mHg/ mild TR/ ventricle septal motion show paradox   TRANSURETHRAL RESECTION OF BLADDER TUMOR N/A 02/13/2016   Procedure: TRANSURETHRAL RESECTION OF BLADDER TUMOR (TURBT);  Surgeon: [name] MD;  [facility]  Service: Urology;  Laterality: N/A;   TRANSURETHRAL RESECTION OF BLADDER TUMOR N/A 09/04/2016   Procedure: TRANSURETHRAL RESECTION OF BLADDER TUMOR (TURBT);  Surgeon: [name] MD;  [facility]  Service: Urology;  Laterality: N/A;     Current Outpatient Medications  Medication Sig Dispense Refill   amoxicillin (AMOXIL) 500 MG tablet Take 4 tablets (2,000  mg total) by mouth as directed. Take 1 hour prior to dental work, including cleanings. 4 tablet 6   aspirin EC 81 MG tablet Take 81 mg by mouth  daily. Swallow whole.     atorvastatin (LIPITOR) 40 MG tablet TAKE 1 TABLET BY MOUTH IN  THE EVENING 90 tablet 3   ELIQUIS 5 MG TABS tablet TAKE 1 TABLET BY MOUTH  TWICE DAILY 180 tablet 3   Eyelid Cleansers (OCUSOFT EYELID CLEANSING) PADS Place 1 application into the left eye in the morning.     ezetimibe (ZETIA) 10 MG tablet TAKE 1 TABLET BY MOUTH IN  THE EVENING 90 tablet 0   furosemide (LASIX) 20 MG tablet TAKE 1 TABLET BY MOUTH  EVERY OTHER DAY, MAY TAKE 1 ADDITIONAL DOSE IF NEEDED  FOR LEG SWELLING 90 tablet 3   losartan (COZAAR) 25 MG tablet Take 1 tablet (25 mg total) by mouth daily. 90 tablet 3   No current facility-administered medications for this visit.    Allergies:   Patient has no known allergies.    Social History:  The patient  reports that he has never smoked. He has never used smokeless tobacco. He reports current alcohol use of about 1.0 - 2.0 standard drink per week. He reports that he does not use drugs.   Family History:  The patient's family history includes Leukemia in his father; Supraventricular tachycardia in his sister.    ROS:  Please see the history of present illness.   Otherwise, review of systems are positive for mild unsteadiness on feet.   All other systems are reviewed and negative.    PHYSICAL EXAM: VS:  BP 132/80    Pulse 67    Ht 6' (1.829 m)    Wt 192 lb (87.1 kg)    SpO2 97%    BMI 26.04 kg/m  , BMI Body mass index is 26.04 kg/m. GEN: Well nourished, well developed, in no acute distress HEENT: normal Neck: no JVD, carotid bruits, or masses Cardiac: irregularly irregular; no murmurs, rubs, or gallops,no edema  Respiratory:  clear to auscultation bilaterally, normal work of breathing GI: soft, nontender, nondistended, + BS MS: no deformity or atrophy Skin: warm and dry, no rash Neuro:  Strength and  sensation are intact Psych: euthymic mood, full affect   EKG:   The ekg ordered 11/2020 demonstrates AFib, controlled rate   Recent Labs: 11/24/2020: ALT 20 11/25/2020: BUN 13; Creatinine, Ser 0.85; Hemoglobin 15.1; Magnesium 1.9; Platelets 138; Potassium 3.7; Sodium 136   Lipid Panel    Component Value Date/Time   CHOL 127 11/25/2020 0258   CHOL 130 04/27/2019 0840   TRIG 57 11/25/2020 0258   HDL 50 11/25/2020 0258   HDL 63 04/27/2019 0840   CHOLHDL 2.5 11/25/2020 0258   VLDL 11 11/25/2020 0258   LDLCALC 66 11/25/2020 0258   LDLCALC 53 04/27/2019 0840     Other studies Reviewed: Additional studies/ records that were reviewed today with results demonstrating: labs reviewed from July 2022.   ASSESSMENT AND PLAN:  AFib: CVA in 2022 while on Eliquis.  Aspirin was added for additional stroke prevention.  No bleeding.   Status post TAVR: Needs SBE prophylaxis. CAD: No angina on medical therapy.  PT is continuing for right leg strength.   Chronic diastolic heart failure: Appears euvolemic. Low salt diet. Hypertension: The current medical regimen is effective;  continue present plan and medications. Prior DVT: On Eliquis.  I stressed the importance of avoiding falls since he is on anticoagulation for acquired thrombophilia in the setting of A. fib.   Current medicines are reviewed at length with the patient today.  The patient concerns regarding his medicines  were addressed.  The following changes have been made:  No change  Labs/ tests ordered today include: CBC, BMet No orders of the defined types were placed in this encounter.   Recommend 150 minutes/week of aerobic exercise Low fat, low carb, high fiber diet recommended  Disposition:   FU in 9 months   Signed, [name], MD  06/14/2021 11:43 AM    [facility] [place], [place], [place]  [address] Phone: [phone]; Fax: [phone]

## 2021-06-13 NOTE — Therapy (Signed)
[address]. [place], [name], [address] Phone: [phone]   Fax:  [phone]  Physical Therapy Treatment  Patient Details  Name: Eric [name] MRN: [id number] Date of Birth: 1930-03-14 Referring Provider (PT): Frann Rider   Encounter Date: 06/13/2021   PT End of Session - 06/13/21 0928     Visit Number 5    Date for PT Re-Evaluation 08/22/21    Authorization Type U[employer] Medicare    PT Start Time 0845    PT Stop Time 0930    PT Time Calculation (min) 45 min    Activity Tolerance Patient tolerated treatment well    Behavior During Therapy [facility] for tasks assessed/performed             Past Medical History:  Diagnosis Date   Atrial flutter, paroxysmal ([place])    a. dx 04-02-2014, s/p  successful cardioversion 04-06-2014.   Chronic anticoagulation    on Eliquis--  due to recurrent dvt's and pe's   Dyslipidemia    History of bladder cancer urologist-  dr [name]   02-13-2016  s/p TURBT per path high grade papillary urothelial carcinoma   History of DVT (deep vein thrombosis)    2000-- RLE   History of melanoma excision    left flank;  07/ 2014 left lower leg and right upper chest   History of prostate cancer    Gleason 6--  s/p  radical prostatectomy 06/ 2000 in [place]---  no recurrence   History of pulmonary embolus (PE)    2000 & 2005  bilateral   Hx of valvuloplasty 06/19/2007   a. 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 05/2007 CABG x 1: s/p SVG-OM;  b. 10/2010 Ex MV: EF 72%, inf attenuation w/o ischemia, brief run of PAT with exercise. To   Stroke ([place])    Thrombocytopenia ([place])    Thyroid goiter 2013   nodular   Wears hearing aid    BILATERAL    Wears partial dentures    UPPER    Past Surgical History:  Procedure Laterality Date   AORTIC VALVE REPLACEMENT (AVR)/CORONARY ARTERY BYPASS GRAFTING (CABG)  06-19-2007  dr [name]   SVG to OM1 ;   AVR w/ #23 Mitralflow pericardial and ligation left atrial appendage;  MV repair w/ #28 Sorin 3-D memo Ring Annuloplasty with repair ruptured chordar of P-2 flail segments   CARDIAC CATHETERIZATION  05-12-2007  dr Leonia Reeves   single vessel 30% ostial LAD,  80% LCx;  severe to critial AS,  moderate MR,  normal LVF, ef 70%,  normal right heart pressures and cardiac outputs,  mild elevated LV end-diastolic pressure     CARDIOVASCULAR STRESS TEST  11-02-2010  dr Irish Lack   normal nuclear study w/ no ischemia or infarct/scar/  normal LV function and wall motion , ef 72%   CARDIOVERSION N/A 04/06/2014   Procedure: CARDIOVERSION;  Surgeon: [name], MD;  Location: [place];  Service: Cardiovascular;  Laterality: N/A;   successful   CATARACT EXTRACTION W/ INTRAOCULAR LENS  IMPLANT, BILATERAL  2012  approx   CORONARY ARTERY BYPASS GRAFT     PROSTATECTOMY     RETROPUBIC RADICAL PROSTATECTOMY  06/ 2000  in [place]   RIGHT/LEFT HEART CATH AND CORONARY/GRAFT ANGIOGRAPHY N/A 10/29/2018   Procedure: RIGHT/LEFT HEART CATH AND CORONARY/GRAFT ANGIOGRAPHY;  Surgeon: [name], MD;  Location: [place] CV LAB;  Service: Cardiovascular;  Laterality: N/A;   ROTATOR CUFF REPAIR Left 09/1998   TEE WITHOUT CARDIOVERSION N/A 04/06/2014   Procedure: TRANSESOPHAGEAL ECHOCARDIOGRAM (TEE);  Surgeon: [name], MD;  Location: [facility] ENDOSCOPY;  Service: Cardiovascular;  Laterality: N/A;  mild focal basa LVH of the septum, ef 50-55%, s/p AV annuloplasty ring, elongated chordae, thickened leaflets, mild to mod. MR, multiple small jets/arotic bioprosthetic valve sits well in position, mild AI/ thickened TV w/ mild reurg./mild LA   TEE WITHOUT CARDIOVERSION N/A 10/22/2018   Procedure: TRANSESOPHAGEAL  ECHOCARDIOGRAM (TEE);  Surgeon: [name], MD;  [facility]COPY;  Service: Cardiovascular;  Laterality: N/A;   TEE WITHOUT CARDIOVERSION N/A 11/25/2018   Procedure: TRANSESOPHAGEAL ECHOCARDIOGRAM (TEE);  Surgeon: [name], MD;  Location: [place];  Service: Open Heart Surgery;  Laterality: N/A;   TRANSCATHETER AORTIC VALVE REPLACEMENT, TRANSFEMORAL N/A 11/25/2018   Procedure: TRANSCATHETER AORTIC VALVE REPLACEMENT, TRANSFEMORAL;  Surgeon: [name], MD;  Location: [place];  Service: Open Heart Surgery;  Laterality: N/A;   TRANSTHORACIC ECHOCARDIOGRAM  03-23-2016   dr Irish Lack   EF 55-60%, reduced contribution atrial contraction to ventricular filling, due to increased ventricular diastolic pressure or atrial contratile dysfunction/  bioprosthetic AV w/ mod. regurg. (valve area 1.97cm^2)/ mild dilated ascending aorta/ mild thicken MV normal function annular ring prosthesis/severe LAE & mod. to sev.RAE/ PASP 62mHg/ mild TR/ ventricle septal motion show paradox   TRANSURETHRAL RESECTION OF BLADDER TUMOR N/A 02/13/2016   Procedure: TRANSURETHRAL RESECTION OF BLADDER TUMOR (TURBT);  Surgeon: [name] MD;  [facility]  Service: Urology;  Laterality: N/A;   TRANSURETHRAL RESECTION OF BLADDER TUMOR N/A 09/04/2016   Procedure: TRANSURETHRAL RESECTION OF BLADDER TUMOR (TURBT);  Surgeon: [name] MD;  [facility]  Service: Urology;  Laterality: N/A;    There were no vitals filed for this visit.   Subjective Assessment - 06/13/21 0847     Subjective "OK, fine"    Currently in Pain? No/denies                               [facility] - 06/13[facility]21 0001       Knee/Hip Exercises: Aerobic   Recumbent Bike L3 x 3 min    Nustep Level 5 x  min      Knee/Hip Exercises: Machines for Strengthening   Cybex Knee Flexion 35lb 2x10      Knee/Hip Exercises: Standing   Lateral  Step Up Both;10 reps;Hand Hold: 0;Step Height: 6";1 set    Forward Step Up Both;2 sets;10 reps;5 reps;Hand Hold: 1   4in box on airex   Walking with Sports Cord 50lb side steps x 5 each      Knee/Hip Exercises: Seated   Other Seated Knee/Hip Exercises Ankle DF green x 15 each    Sit to Sand 2 sets;10 reps;without UE support;Other (comment)      Knee/Hip Exercises: Supine   Quad Sets --   OHP yellow ball                      PT Short Term Goals - 06/06/21 0932       PT SHORT TERM GOAL #1  Title Pt will be I and compliant with initial HEP.    Status Achieved               PT Long Term Goals - 06/08/21 1610       PT LONG TERM GOAL #1   Title Pt will be I and compliant with HEP.    Status Partially Met      PT LONG TERM GOAL #2   Title Pt will improv BERG balance score 47/56 to show improved balance      PT LONG TERM GOAL #3   Title decrease TUG time to 12 seconds    Status On-going      PT LONG TERM GOAL #4   Title increase knee and ankle strength to 4+/5    Status On-going      PT LONG TERM GOAL #5   Title right ankle DF to 5 degrees actively    Status On-going                   Plan - 06/13/21 0923     Clinical Impression Statement Good effort given throughout session. Slight knee valgus stress noted with sit to stands. Pt continues to bear a lot of weight on heels making step ups with airex difficult. Some instability noted with lateral step ups as well. Bilateral ankle stiffness reported with resisted dorsi-flexion. Pt tends to drift forward with resisted side steps.    Stability/Clinical Decision Making Stable/Uncomplicated    Rehab Potential Good    PT Frequency 2x / week    PT Duration 12 weeks    PT Treatment/Interventions ADLs/Self Care Home Management;Gait training;Neuromuscular re-education;Balance training;Therapeutic exercise;Therapeutic activities;Functional mobility training;Stair training;Patient/family education;Manual  techniques    PT Next Visit Plan work on right LE strength, work on balance and function             Patient will benefit from skilled therapeutic intervention in order to improve the following deficits and impairments:  Abnormal gait, Decreased coordination, Difficulty walking, Cardiopulmonary status limiting activity, Decreased endurance, Decreased activity tolerance, Decreased balance, Postural dysfunction, Decreased strength, Decreased mobility  Visit Diagnosis: Other abnormalities of gait and mobility  Difficulty in walking, not elsewhere classified  Muscle weakness (generalized)     Problem List Patient Active Problem List   Diagnosis Date Noted   Cerebral thrombosis with cerebral infarction 11/25/2020   Acute right-sided weakness 11/24/2020   History of mitral valve repair 11/26/2018   Prosthetic valve dysfunction 11/25/2018   Ectatic abdominal aorta ([place]) 11/25/2018   S/P valve-in-valve TAVR 11/25/2018   History of bladder cancer    History of pulmonary embolus (PE)    Acute on chronic diastolic heart failure ([place]) 04/23/2018   History of aortic valve replacement 04/23/2018   Atrial fibrillation ([place]) 04/23/2018   Coronary atherosclerosis of native coronary artery 02/25/2014   Hyperlipidemia 02/25/2014   Embolism and thrombosis ([place]) 03/11/2013    Scot Jun, PTA 06/13/2021, 9:28 AM  [address]. [place], [name], [address] Phone: [phone]   Fax:  [phone]  Name: Eric [name] MRN: [id number] Date of Birth: 11-20-1929

## 2021-06-14 ENCOUNTER — Other Ambulatory Visit: Payer: Medicare Other | Admitting: *Deleted

## 2021-06-14 ENCOUNTER — Encounter: Payer: Self-pay | Admitting: Interventional Cardiology

## 2021-06-14 ENCOUNTER — Ambulatory Visit: Payer: Medicare Other | Admitting: Interventional Cardiology

## 2021-06-14 VITALS — BP 132/80 | HR 67 | Ht 72.0 in | Wt 192.0 lb

## 2021-06-14 DIAGNOSIS — Z9889 Other specified postprocedural states: Secondary | ICD-10-CM

## 2021-06-14 DIAGNOSIS — I10 Essential (primary) hypertension: Secondary | ICD-10-CM | POA: Diagnosis not present

## 2021-06-14 DIAGNOSIS — Z952 Presence of prosthetic heart valve: Secondary | ICD-10-CM

## 2021-06-14 DIAGNOSIS — I25118 Atherosclerotic heart disease of native coronary artery with other forms of angina pectoris: Secondary | ICD-10-CM

## 2021-06-14 DIAGNOSIS — I5032 Chronic diastolic (congestive) heart failure: Secondary | ICD-10-CM

## 2021-06-14 DIAGNOSIS — D6869 Other thrombophilia: Secondary | ICD-10-CM

## 2021-06-14 DIAGNOSIS — I482 Chronic atrial fibrillation, unspecified: Secondary | ICD-10-CM

## 2021-06-14 MED ORDER — APIXABAN 5 MG PO TABS: 5.0000 mg | ORAL_TABLET | Freq: Two times a day (BID) | ORAL | 3 refills | Status: DC

## 2021-06-14 MED ORDER — EZETIMIBE 10 MG PO TABS: 10.0000 mg | ORAL_TABLET | Freq: Every evening | ORAL | 0 refills | Status: DC

## 2021-06-14 MED ORDER — ATORVASTATIN CALCIUM 40 MG PO TABS: 40.0000 mg | ORAL_TABLET | Freq: Every evening | ORAL | 3 refills | Status: DC

## 2021-06-14 NOTE — Patient Instructions (Signed)
Medication Instructions:  Your physician recommends that you continue on your current medications as directed. Please refer to the Current Medication list given to you today.  *If you need a refill on your cardiac medications before your next appointment, please call your pharmacy*   Lab Work: Your physician recommends that you return for lab work later today--BMP and CBC  If you have labs (blood work) drawn today and your tests are completely normal, you will receive your results only by: [place] (if you have MyChart) OR A paper copy in the mail If you have any lab test that is abnormal or we need to change your treatment, we will call you to review the results.   Testing/Procedures: none   Follow-Up: At [facility], you and your health needs are our priority.  As part of our continuing mission to provide you with exceptional heart care, we have created designated Provider Care Teams.  These Care Teams include your primary Cardiologist (physician) and Advanced Practice Providers (APPs -  Physician Assistants and Nurse Practitioners) who all work together to provide you with the care you need, when you need it.  We recommend signing up for the patient portal called "MyChart".  Sign up information is provided on this After Visit Summary.  MyChart is used to connect with patients for Virtual Visits (Telemedicine).  Patients are able to view lab/test results, encounter notes, upcoming appointments, etc.  Non-urgent messages can be sent to your provider as well.   To learn more about what you can do with MyChart, go to [email or web address].    Your next appointment:   9 month(s)  The format for your next appointment:   In Person  Provider:   [name], MD     Other Instructions

## 2021-06-15 ENCOUNTER — Ambulatory Visit: Payer: Medicare Other | Admitting: Physical Therapy

## 2021-06-15 ENCOUNTER — Encounter: Payer: Self-pay | Admitting: Physical Therapy

## 2021-06-15 ENCOUNTER — Other Ambulatory Visit: Payer: Self-pay

## 2021-06-15 DIAGNOSIS — R2689 Other abnormalities of gait and mobility: Secondary | ICD-10-CM

## 2021-06-15 DIAGNOSIS — R262 Difficulty in walking, not elsewhere classified: Secondary | ICD-10-CM

## 2021-06-15 DIAGNOSIS — M6281 Muscle weakness (generalized): Secondary | ICD-10-CM

## 2021-06-15 LAB — CBC
Hematocrit: 45.9 % (ref 37.5–51.0)
Hemoglobin: 15.3 g/dL (ref 13.0–17.7)
MCH: 30.6 pg (ref 26.6–33.0)
MCHC: 33.3 g/dL (ref 31.5–35.7)
MCV: 92 fL (ref 79–97)
Platelets: 161 10*3/uL (ref 150–450)
RBC: 5 x10E6/uL (ref 4.14–5.80)
RDW: 11.9 % (ref 11.6–15.4)
WBC: 5 10*3/uL (ref 3.4–10.8)

## 2021-06-15 LAB — BASIC METABOLIC PANEL
BUN/Creatinine Ratio: 16 (ref 10–24)
BUN: 16 mg/dL (ref 8–27)
CO2: 24 mmol/L (ref 20–29)
Calcium: 9.7 mg/dL (ref 8.6–10.2)
Chloride: 103 mmol/L (ref 96–106)
Creatinine, Ser: 1 mg/dL (ref 0.76–1.27)
Glucose: 81 mg/dL (ref 70–99)
Potassium: 4.5 mmol/L (ref 3.5–5.2)
Sodium: 138 mmol/L (ref 134–144)
eGFR: 72 mL/min/{1.73_m2} (ref >59)

## 2021-06-15 NOTE — Therapy (Signed)
[address]. [place], [name], [address] Phone: [phone]   Fax:  [phone]  Physical Therapy Treatment  Patient Details  Name: Eric [name] MRN: [id number] Date of Birth: 01-20-1930 Referring Provider (PT): Frann Rider   Encounter Date: 06/15/2021   PT End of Session - 06/15/21 1837     Visit Number 6    Date for PT Re-Evaluation 08/22/21    Authorization Type UHC Medicare    PT Start Time 8338    PT Stop Time 1800    PT Time Calculation (min) 46 min    Activity Tolerance Patient tolerated treatment well    Behavior During Therapy [facility] for tasks assessed/performed             Past Medical History:  Diagnosis Date   Atrial flutter, paroxysmal ([place])    a. dx 04-02-2014, s/p  successful cardioversion 04-06-2014.   Chronic anticoagulation    on Eliquis--  due to recurrent dvt's and pe's   Dyslipidemia    History of bladder cancer urologist-  dr [name]   02-13-2016  s/p TURBT per path high grade papillary urothelial carcinoma   History of DVT (deep vein thrombosis)    2000-- RLE   History of melanoma excision    left flank;  07/ 2014 left lower leg and right upper chest   History of prostate cancer    Gleason 6--  s/p  radical prostatectomy 06/ 2000 in [place]---  no recurrence   History of pulmonary embolus (PE)    2000 & 2005  bilateral   Hx of valvuloplasty 06/19/2007   a. 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 05/2007 CABG x 1: s/p SVG-OM;  b. 10/2010 Ex MV: EF 72%, inf attenuation w/o ischemia, brief run of PAT with exercise. To   Stroke ([place])    Thrombocytopenia ([place])    Thyroid goiter 2013   nodular   Wears hearing aid    BILATERAL    Wears partial dentures    UPPER    Past Surgical History:  Procedure Laterality Date   AORTIC VALVE REPLACEMENT (AVR)/CORONARY ARTERY BYPASS GRAFTING (CABG)  06-19-2007  dr [name]   SVG to OM1 ;   AVR w/ #23 Mitralflow pericardial and ligation left atrial appendage;  MV repair w/ #28 Sorin 3-D memo Ring Annuloplasty with repair ruptured chordar of P-2 flail segments   CARDIAC CATHETERIZATION  05-12-2007  dr Leonia Reeves   single vessel 30% ostial LAD,  80% LCx;  severe to critial AS,  moderate MR,  normal LVF, ef 70%,  normal right heart pressures and cardiac outputs,  mild elevated LV end-diastolic pressure     CARDIOVASCULAR STRESS TEST  11-02-2010  dr Irish Lack   normal nuclear study w/ no ischemia or infarct/scar/  normal LV function and wall motion , ef 72%   CARDIOVERSION N/A 04/06/2014   Procedure: CARDIOVERSION;  Surgeon: [name], MD;  Location: [place];  Service: Cardiovascular;  Laterality: N/A;   successful   CATARACT EXTRACTION W/ INTRAOCULAR LENS  IMPLANT, BILATERAL  2012  approx   CORONARY ARTERY BYPASS GRAFT     PROSTATECTOMY     RETROPUBIC RADICAL PROSTATECTOMY  06/ 2000  in [place], [place]   RIGHT/LEFT HEART CATH AND CORONARY/GRAFT ANGIOGRAPHY N/A 10/29/2018   Procedure: RIGHT/LEFT HEART CATH AND CORONARY/GRAFT ANGIOGRAPHY;  Surgeon: [name], MD;  Location: [place] CV LAB;  Service: Cardiovascular;  Laterality: N/A;   ROTATOR CUFF REPAIR Left 09/1998   TEE WITHOUT CARDIOVERSION N/A 04/06/2014   Procedure: TRANSESOPHAGEAL ECHOCARDIOGRAM (TEE);  Surgeon: [name], MD;  [facility] END[facility];  Service: Cardiovascular;  Laterality: N/A;  mild focal basa LVH of the septum, ef 50-55%, s/p AV annuloplasty ring, elongated chordae, thickened leaflets, mild to mod. MR, multiple small jets/arotic bioprosthetic valve sits well in position, mild AI/ thickened TV w/ mild reurg./mild LA   TEE WITHOUT CARDIOVERSION N/A 10/22/2018   Procedure: TRANSESOPHAGEAL  ECHOCARDIOGRAM (TEE);  Surgeon: [name], MD;  Location: [facility] ENDOSCOPY;  Service: Cardiovascular;  Laterality: N/A;   TEE WITHOUT CARDIOVERSION N/A 11/25/2018   Procedure: TRANSESOPHAGEAL ECHOCARDIOGRAM (TEE);  Surgeon: [name], MD;  Location: [place];  Service: Open Heart Surgery;  Laterality: N/A;   TRANSCATHETER AORTIC VALVE REPLACEMENT, TRANSFEMORAL N/A 11/25/2018   Procedure: TRANSCATHETER AORTIC VALVE REPLACEMENT, TRANSFEMORAL;  Surgeon: [name], MD;  Location: [place];  Service: Open Heart Surgery;  Laterality: N/A;   TRANSTHORACIC ECHOCARDIOGRAM  03-23-2016   dr Irish Lack   EF 55-60%, reduced contribution atrial contraction to ventricular filling, due to increased ventricular diastolic pressure or atrial contratile dysfunction/  bioprosthetic AV w/ mod. regurg. (valve area 1.97cm^2)/ mild dilated ascending aorta/ mild thicken MV normal function annular ring prosthesis/severe LAE & mod. to sev.RAE/ PASP 58mmHg/ mild TR/ ventricle septal motion show paradox   TRANSURETHRAL RESECTION OF BLADDER TUMOR N/A 02/13/2016   Procedure: TRANSURETHRAL RESECTION OF BLADDER TUMOR (TURBT);  Surgeon: Nickie Retort, MD;  Location: [facility];  Service: Urology;  Laterality: N/A;   TRANSURETHRAL RESECTION OF BLADDER TUMOR N/A 09/04/2016   Procedure: TRANSURETHRAL RESECTION OF BLADDER TUMOR (TURBT);  Surgeon: Nickie Retort, MD;  Location: [facility];  Service: Urology;  Laterality: N/A;    There were no vitals filed for this visit.   Subjective Assessment - 06/15/21 1722     Subjective I was tired after last time, no falls, no stumbles    Currently in Pain? No/denies                               [facility] Adult PT Treatment/Exercise - 06/15/21 0001       High Level Balance   High Level Balance Comments Sides step over half foam roll, walking ball toss, on airex beach ball batting, then step on and off airex side steps       Knee/Hip Exercises: Aerobic   Recumbent Bike L3 x 5 min    Nustep Level 5 x  6 min      Knee/Hip Exercises: Machines for Strengthening   Cybex Knee Extension 10lb 2x10    Cybex Knee Flexion 35lb 2x10    Other Machine straight arm pulls 10# 2x10, then resisted gait 40# all directions      Knee/Hip Exercises: Seated   Other Seated Knee/Hip Exercises simulation of driving with red and green weight at the right foot and he had to lift over a stick to get foot onto,                       PT Short Term Goals - 06/06/21 4174  PT SHORT TERM GOAL #1   Title Pt will be I and compliant with initial HEP.    Status Achieved               PT Long Term Goals - 06/15/21 1839       PT LONG TERM GOAL #3   Title decrease TUG time to 12 seconds    Status On-going      PT LONG TERM GOAL #4   Title increase knee and ankle strength to 4+/5    Status On-going                   Plan - 06/15/21 1837     Clinical Impression Statement patient really struggles on the dynamic surfaces, he tends to go back on his heels and lose balance backward, he can normally correct with stepping, On solid surfaces he does well, some difficulty with lateral movements stepping over objects    PT Next Visit Plan work on right LE strength, work on balance and function    Consulted and Agree with Plan of Care Patient             Patient will benefit from skilled therapeutic intervention in order to improve the following deficits and impairments:  Abnormal gait, Decreased coordination, Difficulty walking, Cardiopulmonary status limiting activity, Decreased endurance, Decreased activity tolerance, Decreased balance, Postural dysfunction, Decreased strength, Decreased mobility  Visit Diagnosis: Other abnormalities of gait and mobility  Difficulty in walking, not elsewhere classified  Muscle weakness (generalized)     Problem List Patient Active Problem List   Diagnosis Date  Noted   Cerebral thrombosis with cerebral infarction 11/25/2020   Acute right-sided weakness 11/24/2020   History of mitral valve repair 11/26/2018   Prosthetic valve dysfunction 11/25/2018   Ectatic abdominal aorta ([place]) 11/25/2018   S/P valve-in-valve TAVR 11/25/2018   History of bladder cancer    History of pulmonary embolus (PE)    Acute on chronic diastolic heart failure ([place]) 04/23/2018   History of aortic valve replacement 04/23/2018   Atrial fibrillation ([place]) 04/23/2018   Coronary atherosclerosis of native coronary artery 02/25/2014   Hyperlipidemia 02/25/2014   Embolism and thrombosis ([place]) 03/11/2013    Sumner Boast, PT 06/15/2021, 6:40 PM  [address] [place], [name], [address] Phone: [phone]   Fax:  [phone]  Name: Eric [name] MRN: [id number] Date of Birth: [birth year]

## 2021-06-20 ENCOUNTER — Encounter: Payer: Self-pay | Admitting: Physical Therapy

## 2021-06-20 ENCOUNTER — Other Ambulatory Visit: Payer: Self-pay

## 2021-06-20 ENCOUNTER — Ambulatory Visit: Payer: Medicare Other | Admitting: Physical Therapy

## 2021-06-20 DIAGNOSIS — M6281 Muscle weakness (generalized): Secondary | ICD-10-CM

## 2021-06-20 DIAGNOSIS — R262 Difficulty in walking, not elsewhere classified: Secondary | ICD-10-CM

## 2021-06-20 DIAGNOSIS — R2689 Other abnormalities of gait and mobility: Secondary | ICD-10-CM | POA: Diagnosis not present

## 2021-06-20 NOTE — Therapy (Signed)
[place]. [place], [name], [address] Phone: [phone]   Fax:  [phone]  Physical Therapy Treatment  Patient Details  Name: WAYMON LASER MRN: [id number] Date of Birth: [birth year] Referring Provider (PT): Frann Rider   Encounter Date: 06/20/2021   PT End of Session - 06/20/21 0924     Visit Number 7    Date for PT Re-Evaluation 08/22/21    Authorization Type [employer] Medicare    PT Start Time 0845    PT Stop Time 0930    PT Time Calculation (min) 45 min    Activity Tolerance Patient tolerated treatment well    Behavior During Therapy [facility] for tasks assessed/performed             Past Medical History:  Diagnosis Date   Atrial flutter, paroxysmal ([place])    a. dx 04-02-2014, s/p  successful cardioversion 04-06-2014.   Chronic anticoagulation    on Eliquis--  due to recurrent dvt's and pe's   Dyslipidemia    History of bladder cancer urologist-  dr [name]   02-13-2016  s/p TURBT per path high grade papillary urothelial carcinoma   History of DVT (deep vein thrombosis)    2000-- RLE   History of melanoma excision    left flank;  07/ 2014 left lower leg and right upper chest   History of prostate cancer    Gleason 6--  s/p  radical prostatectomy 06/ 2000 in [place]---  no recurrence   History of pulmonary embolus (PE)    2000 & 2005  bilateral   Hx of valvuloplasty 06/19/2007   a. 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 05/2007 CABG x 1: s/p SVG-OM;  b. 10/2010 Ex MV: EF 72%, inf attenuation w/o ischemia, brief run of PAT with exercise. To   Stroke ([place])    Thrombocytopenia ([place])    Thyroid goiter 2013   nodular   Wears hearing aid    BILATERAL    Wears partial dentures    UPPER    Past Surgical History:  Procedure Laterality Date   AORTIC VALVE REPLACEMENT (AVR)/CORONARY ARTERY BYPASS GRAFTING (CABG)  06-19-2007  dr [name]   SVG to OM1 ;   AVR w/ #23 Mitralflow pericardial and ligation left atrial appendage;  MV repair w/ #28 Sorin 3-D memo Ring Annuloplasty with repair ruptured chordar of P-2 flail segments   CARDIAC CATHETERIZATION  05-12-2007  dr Leonia Reeves   single vessel 30% ostial LAD,  80% LCx;  severe to critial AS,  moderate MR,  normal LVF, ef 70%,  normal right heart pressures and cardiac outputs,  mild elevated LV end-diastolic pressure     CARDIOVASCULAR STRESS TEST  11-02-2010  dr Irish Lack   normal nuclear study w/ no ischemia or infarct/scar/  normal LV function and wall motion , ef 72%   CARDIOVERSION N/A 04/06/2014   Procedure: CARDIOVERSION;  Surgeon: [name], MD;  Location: [place];  Service: Cardiovascular;  Laterality: N/A;   successful   CATARACT EXTRACTION W/ INTRAOCULAR LENS  IMPLANT, BILATERAL  2012  approx   CORONARY ARTERY BYPASS GRAFT     PROSTATECTOMY     RETROPUBIC RADICAL PROSTATECTOMY  06/ 2000  in [place]   RIGHT/LEFT HEART CATH AND CORONARY/GRAFT ANGIOGRAPHY N/A 10/29/2018   Procedure: RIGHT/LEFT HEART CATH AND CORONARY/GRAFT ANGIOGRAPHY;  Surgeon: [name], MD;  Location: [place] CV [facility];  Service: Cardiovascular;  Laterality: N/A;   ROTATOR CUFF REPAIR Left 09/1998   TEE WITHOUT CARDIOVERSION N/A 04/06/2014   Procedure: TRANSESOPHAGEAL ECHOCARDIOGRAM (TEE);  Surgeon: [name], MD;  Location: [facility] ENDOSCOPY;  Service: Cardiovascular;  Laterality: N/A;  mild focal basa LVH of the septum, ef 50-55%, s/p AV annuloplasty ring, elongated chordae, thickened leaflets, mild to mod. MR, multiple small jets/arotic bioprosthetic valve sits well in position, mild AI/ thickened TV w/ mild reurg./mild LA   TEE WITHOUT CARDIOVERSION N/A 10/22/2018   Procedure: TRANSESOPHAGEAL  ECHOCARDIOGRAM (TEE);  Surgeon: [name], MD;  Location: [facility] ENDOSCOPY;  Service: Cardiovascular;  Laterality: N/A;   TEE WITHOUT CARDIOVERSION N/A 11/25/2018   Procedure: TRANSESOPHAGEAL ECHOCARDIOGRAM (TEE);  Surgeon: [name], MD;  Location: [place];  Service: Open Heart Surgery;  Laterality: N/A;   TRANSCATHETER AORTIC VALVE REPLACEMENT, TRANSFEMORAL N/A 11/25/2018   Procedure: TRANSCATHETER AORTIC VALVE REPLACEMENT, TRANSFEMORAL;  Surgeon: [name], MD;  Location: [place];  Service: Open Heart Surgery;  Laterality: N/A;   TRANSTHORACIC ECHOCARDIOGRAM  03-23-2016   dr Irish Lack   EF 55-60%, reduced contribution atrial contraction to ventricular filling, due to increased ventricular diastolic pressure or atrial contratile dysfunction/  bioprosthetic AV w/ mod. regurg. (valve area 1.97cm^2)/ mild dilated ascending aorta/ mild thicken MV normal function annular ring prosthesis/severe LAE & mod. to sev.RAE/ PASP 59mmHg/ mild TR/ ventricle septal motion show paradox   TRANSURETHRAL RESECTION OF BLADDER TUMOR N/A 02/13/2016   Procedure: TRANSURETHRAL RESECTION OF BLADDER TUMOR (TURBT);  Surgeon: Nickie Retort, MD;  Location: [facility];  Service: Urology;  Laterality: N/A;   TRANSURETHRAL RESECTION OF BLADDER TUMOR N/A 09/04/2016   Procedure: TRANSURETHRAL RESECTION OF BLADDER TUMOR (TURBT);  Surgeon: Nickie Retort, MD;  Location: [facility];  Service: Urology;  Laterality: N/A;    There were no vitals filed for this visit.   Subjective Assessment - 06/20/21 0845     Subjective "Good, Good" No falls or issues    Currently in Pain? No/denies                               [facility] Adult PT Treatment/Exercise - 06/20/21 0001       Knee/Hip Exercises: Aerobic   Recumbent Bike L3 x 5 min    Nustep Level 5 x  6 min      Knee/Hip Exercises: Machines for Strengthening   Cybex Knee Extension 10lb 2x10    Cybex Knee Flexion  35lb 2x10      Knee/Hip Exercises: Standing   Lateral Step Up Both;1 set;10 reps;Hand Hold: 0;Other (comment)   $in box on airex   Forward Step Up Both;10 reps;5 reps;Hand Hold: 1;Other (comment);1 set   4in box on airex   Walking with Sports Cord 30lb side step over half foam roll x10 each      Knee/Hip Exercises: Seated   Other Seated Knee/Hip Exercises simulation of driving with red and green weight at the right foot and he had to lift over a stick to get foot onto,    Other Seated Knee/Hip Exercises Hip abd 5lb x15  PT Short Term Goals - 06/06/21 0932       PT SHORT TERM GOAL #1   Title Pt will be I and compliant with initial HEP.    Status Achieved               PT Long Term Goals - 06/15/21 1839       PT LONG TERM GOAL #3   Title decrease TUG time to 12 seconds    Status On-going      PT LONG TERM GOAL #4   Title increase knee and ankle strength to 4+/5    Status On-going                   Plan - 06/20/21 0925     Clinical Impression Statement Pt able to complete all interventions. resisted side steps over foam roll and step ups with the box on airex was very difficulty for pt causing pt to lose balance multiple of times. Min to mod assist required for pt to regain balance with balance interventions. Bi lat hi fatigue with driving simulation interventions.    Stability/Clinical Decision Making Stable/Uncomplicated    Rehab Potential Good    PT Frequency 2x / week    PT Treatment/Interventions ADLs/Self Care Home Management;Gait training;Neuromuscular re-education;Balance training;Therapeutic exercise;Therapeutic activities;Functional mobility training;Stair training;Patient/family education;Manual techniques    PT Next Visit Plan work on right LE strength, work on balance and function             Patient will benefit from skilled therapeutic intervention in order to improve the following deficits and impairments:   Abnormal gait, Decreased coordination, Difficulty walking, Cardiopulmonary status limiting activity, Decreased endurance, Decreased activity tolerance, Decreased balance, Postural dysfunction, Decreased strength, Decreased mobility  Visit Diagnosis: Muscle weakness (generalized)  Other abnormalities of gait and mobility  Difficulty in walking, not elsewhere classified     Problem List Patient Active Problem List   Diagnosis Date Noted   Cerebral thrombosis with cerebral infarction 11/25/2020   Acute right-sided weakness 11/24/2020   History of mitral valve repair 11/26/2018   Prosthetic valve dysfunction 11/25/2018   Ectatic abdominal aorta ([place]) 11/25/2018   S/P valve-in-valve TAVR 11/25/2018   History of bladder cancer    History of pulmonary embolus (PE)    Acute on chronic diastolic heart failure ([place]) 04/23/2018   History of aortic valve replacement 04/23/2018   Atrial fibrillation ([place]) 04/23/2018   Coronary atherosclerosis of native coronary artery 02/25/2014   Hyperlipidemia 02/25/2014   Embolism and thrombosis ([place]) 03/11/2013    Scot Jun, PTA 06/20/2021, 9:28 AM  [place]. [place], [name], [address] Phone: [phone]   Fax:  [phone]  Name: Eric [name] MRN: [id number] Date of Birth: 1929/09/07

## 2021-06-22 ENCOUNTER — Ambulatory Visit: Payer: Medicare Other | Attending: Internal Medicine | Admitting: Physical Therapy

## 2021-06-22 ENCOUNTER — Encounter: Payer: Self-pay | Admitting: Physical Therapy

## 2021-06-22 ENCOUNTER — Other Ambulatory Visit: Payer: Self-pay

## 2021-06-22 DIAGNOSIS — R262 Difficulty in walking, not elsewhere classified: Secondary | ICD-10-CM | POA: Diagnosis present

## 2021-06-22 DIAGNOSIS — R278 Other lack of coordination: Secondary | ICD-10-CM | POA: Diagnosis present

## 2021-06-22 DIAGNOSIS — R2689 Other abnormalities of gait and mobility: Secondary | ICD-10-CM | POA: Diagnosis present

## 2021-06-22 DIAGNOSIS — M6281 Muscle weakness (generalized): Secondary | ICD-10-CM | POA: Insufficient documentation

## 2021-06-22 NOTE — Therapy (Signed)
[place]. [place], [name], [address] Phone: [phone]   Fax:  [phone]  Physical Therapy Treatment  Patient Details  Name: Eric [name] MRN: [id number] Date of Birth: [birth year] Referring Provider (PT): Frann Rider   Encounter Date: 06/22/2021   PT End of Session - 06/22/21 1528     Visit Number 8    Date for PT Re-Evaluation 08/22/21    Authorization Type UHC Medicare    PT Start Time 6962    PT Stop Time 1528    PT Time Calculation (min) 47 min    Activity Tolerance Patient tolerated treatment well    Behavior During Therapy [facility] for tasks assessed/performed             Past Medical History:  Diagnosis Date   Atrial flutter, paroxysmal ([place])    a. dx 04-02-2014, s/p  successful cardioversion 04-06-2014.   Chronic anticoagulation    on Eliquis--  due to recurrent dvt's and pe's   Dyslipidemia    History of bladder cancer urologist-  dr [name]   02-13-2016  s/p TURBT per path high grade papillary urothelial carcinoma   History of DVT (deep vein thrombosis)    2000-- RLE   History of melanoma excision    left flank;  07/ 2014 left lower leg and right upper chest   History of prostate cancer    Gleason 6--  s/p  radical prostatectomy 06/ 2000 in [place]---  no recurrence   History of pulmonary embolus (PE)    2000 & 2005  bilateral   Hx of valvuloplasty 06/19/2007   a. 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 05/2007 CABG x 1: s/p SVG-OM;  b. 10/2010 Ex MV: EF 72%, inf attenuation w/o ischemia, brief run of PAT with exercise. To   Stroke ([place])    Thrombocytopenia ([place])    Thyroid goiter 2013   nodular   Wears hearing aid    BILATERAL    Wears partial dentures    UPPER    Past Surgical History:  Procedure Laterality Date   AORTIC VALVE REPLACEMENT (AVR)/CORONARY ARTERY BYPASS GRAFTING (CABG)  06-19-2007  dr [name]   SVG to OM1 ;   AVR w/ #23 Mitralflow pericardial and ligation left atrial appendage;  MV repair w/ #28 Sorin 3-D memo Ring Annuloplasty with repair ruptured chordar of P-2 flail segments   CARDIAC CATHETERIZATION  05-12-2007  dr Leonia Reeves   single vessel 30% ostial LAD,  80% LCx;  severe to critial AS,  moderate MR,  normal LVF, ef 70%,  normal right heart pressures and cardiac outputs,  mild elevated LV end-diastolic pressure     CARDIOVASCULAR STRESS TEST  11-02-2010  dr Irish Lack   normal nuclear study w/ no ischemia or infarct/scar/  normal LV function and wall motion , ef 72%   CARDIOVERSION N/A 04/06/2014   Procedure: CARDIOVERSION;  Surgeon: [name], MD;  Location: [place];  Service: Cardiovascular;  Laterality: N/A;   successful   CATARACT EXTRACTION W/ INTRAOCULAR LENS  IMPLANT, BILATERAL  2012  approx   CORONARY ARTERY BYPASS GRAFT     PROSTATECTOMY     RETROPUBIC RADICAL PROSTATECTOMY  06/ 2000  in [place], [place]   RIGHT/LEFT HEART CATH AND CORONARY/GRAFT ANGIOGRAPHY N/A 10/29/2018   Procedure: RIGHT/LEFT HEART CATH AND CORONARY/GRAFT ANGIOGRAPHY;  Surgeon: [name], MD;  Location: [place] CV [facility];  Service: Cardiovascular;  Laterality: N/A;   ROTATOR CUFF REPAIR Left 09/1998   TEE WITHOUT CARDIOVERSION N/A 04/06/2014   Procedure: TRANSESOPHAGEAL ECHOCARDIOGRAM (TEE);  Surgeon: [name], MD;  Location: [facility] ENDOSCOPY;  Service: Cardiovascular;  Laterality: N/A;  mild focal basa LVH of the septum, ef 50-55%, s/p AV annuloplasty ring, elongated chordae, thickened leaflets, mild to mod. MR, multiple small jets/arotic bioprosthetic valve sits well in position, mild AI/ thickened TV w/ mild reurg./mild LA   TEE WITHOUT CARDIOVERSION N/A 10/22/2018   Procedure: TRANSESOPHAGEAL  ECHOCARDIOGRAM (TEE);  Surgeon: [name], MD;  Location: [facility] ENDOSCOPY;  Service: Cardiovascular;  Laterality: N/A;   TEE WITHOUT CARDIOVERSION N/A 11/25/2018   Procedure: TRANSESOPHAGEAL ECHOCARDIOGRAM (TEE);  Surgeon: [name], MD;  Location: [place];  Service: Open Heart Surgery;  Laterality: N/A;   TRANSCATHETER AORTIC VALVE REPLACEMENT, TRANSFEMORAL N/A 11/25/2018   Procedure: TRANSCATHETER AORTIC VALVE REPLACEMENT, TRANSFEMORAL;  Surgeon: [name], MD;  Location: [place];  Service: Open Heart Surgery;  Laterality: N/A;   TRANSTHORACIC ECHOCARDIOGRAM  03-23-2016   dr Irish Lack   EF 55-60%, reduced contribution atrial contraction to ventricular filling, due to increased ventricular diastolic pressure or atrial contratile dysfunction/  bioprosthetic AV w/ mod. regurg. (valve area 1.97cm^2)/ mild dilated ascending aorta/ mild thicken MV normal function annular ring prosthesis/severe LAE & mod. to sev.RAE/ PASP 76mHg/ mild TR/ ventricle septal motion show paradox   TRANSURETHRAL RESECTION OF BLADDER TUMOR N/A 02/13/2016   Procedure: TRANSURETHRAL RESECTION OF BLADDER TUMOR (TURBT);  Surgeon: [name] MD;  [facility]  Service: Urology;  Laterality: N/A;   TRANSURETHRAL RESECTION OF BLADDER TUMOR N/A 09/04/2016   Procedure: TRANSURETHRAL RESECTION OF BLADDER TUMOR (TURBT);  Surgeon: [name] MD;  [facility]  Service: Urology;  Laterality: N/A;    There were no vitals filed for this visit.   Subjective Assessment - 06/22/21 1445     Subjective No falls, just a little tired and not moving well today, maybe the weather    Currently in Pain? No/denies                               [facility] - [facility]       High Level Balance   High Level Balance Comments walking ball toss fwd/bkwd, on airex ball toss, steppingon and off airex side steps, perturbations, on  airex body blade      Knee/Hip Exercises: Aerobic   Recumbent Bike Level 5 x 4 minutes    Nustep Level 5 x  7 min      Knee/Hip Exercises: Machines for Strengthening   Cybex Leg Press 70# 3x10, calf press 50# 2x10      Knee/Hip Exercises: Standing   Walking with Sports Cord 40# fwd with 8" toe touch, then 30# side stepping with 8" toe touch                       PT Short Term Goals - 06/06/21 0932       PT SHORT TERM GOAL #1   Title Pt will be I and compliant with initial HEP.    Status Achieved  PT Long Term Goals - 06/22/21 1530       PT LONG TERM GOAL #1   Title Pt will be I and compliant with HEP.    Status Partially Met      PT LONG TERM GOAL #2   Title Pt will improv BERG balance score 47/56 to show improved balance    Status On-going      PT LONG TERM GOAL #3   Title decrease TUG time to 12 seconds    Status On-going                   Plan - 06/22/21 1528     Clinical Impression Statement Patient still very difficult with any dynamic surface activities, tends to be back on his feet, he does have the step reflex and he uses it well, With perturbations he really has to step especially backward, may need to see if he can use hip and ankle strategies for falling backward as well    PT Next Visit Plan really push balance activities and function    Consulted and Agree with Plan of Care Patient             Patient will benefit from skilled therapeutic intervention in order to improve the following deficits and impairments:  Abnormal gait, Decreased coordination, Difficulty walking, Cardiopulmonary status limiting activity, Decreased endurance, Decreased activity tolerance, Decreased balance, Postural dysfunction, Decreased strength, Decreased mobility  Visit Diagnosis: Muscle weakness (generalized)  Other abnormalities of gait and mobility  Difficulty in walking, not elsewhere classified     Problem List Patient  Active Problem List   Diagnosis Date Noted   Cerebral thrombosis with cerebral infarction 11/25/2020   Acute right-sided weakness 11/24/2020   History of mitral valve repair 11/26/2018   Prosthetic valve dysfunction 11/25/2018   Ectatic abdominal aorta ([place]) 11/25/2018   S/P valve-in-valve TAVR 11/25/2018   History of bladder cancer    History of pulmonary embolus (PE)    Acute on chronic diastolic heart failure ([place]) 04/23/2018   History of aortic valve replacement 04/23/2018   Atrial fibrillation ([place]) 04/23/2018   Coronary atherosclerosis of native coronary artery 02/25/2014   Hyperlipidemia 02/25/2014   Embolism and 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 [place], [name], [address] Phone: [phone]   Fax:  [phone]  Name: Eric [name] MRN: [id number] Date of Birth: 1930-05-17

## 2021-06-27 ENCOUNTER — Other Ambulatory Visit: Payer: Self-pay

## 2021-06-27 ENCOUNTER — Encounter: Payer: Self-pay | Admitting: Physical Therapy

## 2021-06-27 ENCOUNTER — Ambulatory Visit: Payer: Medicare Other | Admitting: Physical Therapy

## 2021-06-27 DIAGNOSIS — R278 Other lack of coordination: Secondary | ICD-10-CM

## 2021-06-27 DIAGNOSIS — M6281 Muscle weakness (generalized): Secondary | ICD-10-CM | POA: Diagnosis not present

## 2021-06-27 DIAGNOSIS — R262 Difficulty in walking, not elsewhere classified: Secondary | ICD-10-CM

## 2021-06-27 DIAGNOSIS — R2689 Other abnormalities of gait and mobility: Secondary | ICD-10-CM

## 2021-06-27 NOTE — Therapy (Signed)
[place] [place], [name], [address] Phone: [phone]   Fax:  [phone]  Physical Therapy Treatment  Patient Details  Name: Eric [name] MRN: [id number] Date of Birth: 1930-05-02 Referring Provider (PT): Frann Rider   Encounter Date: 06/27/2021   PT End of Session - 06/27/21 1102     Visit Number 9    Date for PT Re-Evaluation 08/22/21    Authorization Type U[employer] Medicare    PT Start Time 8032    PT Stop Time 1058    PT Time Calculation (min) 43 min    Activity Tolerance Patient tolerated treatment well    Behavior During Therapy [facility] for tasks assessed/performed             Past Medical History:  Diagnosis Date   Atrial flutter, paroxysmal ([place])    a. dx 04-02-2014, s/p  successful cardioversion 04-06-2014.   Chronic anticoagulation    on Eliquis--  due to recurrent dvt's and pe's   Dyslipidemia    History of bladder cancer urologist-  dr [name]   02-13-2016  s/p TURBT per path high grade papillary urothelial carcinoma   History of DVT (deep vein thrombosis)    2000-- RLE   History of melanoma excision    left flank;  07/ 2014 left lower leg and right upper chest   History of prostate cancer    Gleason 6--  s/p  radical prostatectomy 06/ 2000 in [place]---  no recurrence   History of pulmonary embolus (PE)    2000 & 2005  bilateral   Hx of valvuloplasty 06/19/2007   a. 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 05/2007 CABG x 1: s/p SVG-OM;  b. 10/2010 Ex MV: EF 72%, inf attenuation w/o ischemia, brief run of PAT with exercise. To   Stroke ([place])    Thrombocytopenia ([place])    Thyroid goiter 2013   nodular   Wears hearing aid    BILATERAL    Wears partial dentures    UPPER    Past Surgical History:  Procedure Laterality Date   AORTIC VALVE REPLACEMENT (AVR)/CORONARY ARTERY BYPASS GRAFTING (CABG)  06-19-2007  dr [name]   SVG to OM1 ;   AVR w/ #23 Mitralflow pericardial and ligation left atrial appendage;  MV repair w/ #28 Sorin 3-D memo Ring Annuloplasty with repair ruptured chordar of P-2 flail segments   CARDIAC CATHETERIZATION  05-12-2007  dr Leonia Reeves   single vessel 30% ostial LAD,  80% LCx;  severe to critial AS,  moderate MR,  normal LVF, ef 70%,  normal right heart pressures and cardiac outputs,  mild elevated LV end-diastolic pressure     CARDIOVASCULAR STRESS TEST  11-02-2010  dr Irish Lack   normal nuclear study w/ no ischemia or infarct/scar/  normal LV function and wall motion , ef 72%   CARDIOVERSION N/A 04/06/2014   Procedure: CARDIOVERSION;  Surgeon: [name], MD;  Location: [place];  Service: Cardiovascular;  Laterality: N/A;   successful   CATARACT EXTRACTION W/ INTRAOCULAR LENS  IMPLANT, BILATERAL  2012  approx   CORONARY ARTERY BYPASS GRAFT     PROSTATECTOMY     RETROPUBIC RADICAL PROSTATECTOMY  06/ 2000  in [place]   RIGHT/LEFT HEART CATH AND CORONARY/GRAFT ANGIOGRAPHY N/A 10/29/2018   Procedure: RIGHT/LEFT HEART CATH AND CORONARY/GRAFT ANGIOGRAPHY;  Surgeon: [name], MD;  Location: [place] CV [facility];  Service: Cardiovascular;  Laterality: N/A;   ROTATOR CUFF REPAIR Left 09/1998   TEE WITHOUT CARDIOVERSION N/A 04/06/2014   Procedure: TRANSESOPHAGEAL ECHOCARDIOGRAM (TEE);  Surgeon: [name], MD;  Location: [facility] ENDOSCOPY;  Service: Cardiovascular;  Laterality: N/A;  mild focal basa LVH of the septum, ef 50-55%, s/p AV annuloplasty ring, elongated chordae, thickened leaflets, mild to mod. MR, multiple small jets/arotic bioprosthetic valve sits well in position, mild AI/ thickened TV w/ mild reurg./mild LA   TEE WITHOUT CARDIOVERSION N/A 10/22/2018   Procedure: TRANSESOPHAGEAL  ECHOCARDIOGRAM (TEE);  Surgeon: [name], MD;  Location: [facility] ENDOSCOPY;  Service: Cardiovascular;  Laterality: N/A;   TEE WITHOUT CARDIOVERSION N/A 11/25/2018   Procedure: TRANSESOPHAGEAL ECHOCARDIOGRAM (TEE);  Surgeon: [name], MD;  Location: [place];  Service: Open Heart Surgery;  Laterality: N/A;   TRANSCATHETER AORTIC VALVE REPLACEMENT, TRANSFEMORAL N/A 11/25/2018   Procedure: TRANSCATHETER AORTIC VALVE REPLACEMENT, TRANSFEMORAL;  Surgeon: [name], MD;  Location: [place];  Service: Open Heart Surgery;  Laterality: N/A;   TRANSTHORACIC ECHOCARDIOGRAM  03-23-2016   dr Irish Lack   EF 55-60%, reduced contribution atrial contraction to ventricular filling, due to increased ventricular diastolic pressure or atrial contratile dysfunction/  bioprosthetic AV w/ mod. regurg. (valve area 1.97cm^2)/ mild dilated ascending aorta/ mild thicken MV normal function annular ring prosthesis/severe LAE & mod. to sev.RAE/ PASP 81mmHg/ mild TR/ ventricle septal motion show paradox   TRANSURETHRAL RESECTION OF BLADDER TUMOR N/A 02/13/2016   Procedure: TRANSURETHRAL RESECTION OF BLADDER TUMOR (TURBT);  Surgeon: Nickie Retort, MD;  Location: [facility];  Service: Urology;  Laterality: N/A;   TRANSURETHRAL RESECTION OF BLADDER TUMOR N/A 09/04/2016   Procedure: TRANSURETHRAL RESECTION OF BLADDER TUMOR (TURBT);  Surgeon: Nickie Retort, MD;  Location: [facility];  Service: Urology;  Laterality: N/A;    There were no vitals filed for this visit.   Subjective Assessment - 06/27/21 1017     Subjective Patient reports that last night he was taking out the garbage, went to sit on the toilet to change the bag out and slipped and fell backward, has some bruising on the posterior arm    Currently in Pain? No/denies                               [facility] Adult PT Treatment/Exercise - 06/27/21 0001       Ambulation/Gait   Gait Comments outside around  building and then in the grass challenging his balance      High Level Balance   High Level Balance Comments foot on 8" and then a 4" step with ball toss, very difficult for him with the 8", seated ball slams, weighted ball sit to stand tosses, side stepping on and off airex      Knee/Hip Exercises: Aerobic   Recumbent Bike Level 5 x 4 minutes    Nustep Level 5 x  5 min      Knee/Hip Exercises: Machines for Strengthening   Cybex Knee Extension 10lb 2x10    Cybex Knee Flexion 35lb 2x10                       PT Short Term Goals - 06/06/21 5465  PT SHORT TERM GOAL #1   Title Pt will be I and compliant with initial HEP.    Status Achieved               PT Long Term Goals - 06/27/21 1153       PT LONG TERM GOAL #2   Title Pt will improv BERG balance score 47/56 to show improved balance    Status On-going      PT LONG TERM GOAL #3   Title decrease TUG time to 12 seconds    Status On-going                   Plan - 06/27/21 1103     Clinical Impression Statement Patient had a fall while going to sit on the toilet to change the trash bag, he fell backward, no injury.  He had on different shoes today, did well, still struggles and loses the balance to the back, he has great step reaction    PT Next Visit Plan really push balance activities and function    Consulted and Agree with Plan of Care Patient             Patient will benefit from skilled therapeutic intervention in order to improve the following deficits and impairments:  Abnormal gait, Decreased coordination, Difficulty walking, Cardiopulmonary status limiting activity, Decreased endurance, Decreased activity tolerance, Decreased balance, Postural dysfunction, Decreased strength, Decreased mobility  Visit Diagnosis: Muscle weakness (generalized)  Other abnormalities of gait and mobility  Difficulty in walking, not elsewhere classified  Other lack of coordination     Problem  List Patient Active Problem List   Diagnosis Date Noted   Cerebral thrombosis with cerebral infarction 11/25/2020   Acute right-sided weakness 11/24/2020   History of mitral valve repair 11/26/2018   Prosthetic valve dysfunction 11/25/2018   Ectatic abdominal aorta ([place]) 11/25/2018   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 [place], [name], [address] Phone: [phone]   Fax:  [phone]  Name: Eric [name] MRN: [id number] Date of Birth: [birth year]

## 2021-06-29 ENCOUNTER — Other Ambulatory Visit: Payer: Self-pay

## 2021-06-29 ENCOUNTER — Ambulatory Visit: Payer: Medicare Other | Admitting: Physical Therapy

## 2021-06-29 ENCOUNTER — Encounter: Payer: Self-pay | Admitting: Physical Therapy

## 2021-06-29 DIAGNOSIS — R262 Difficulty in walking, not elsewhere classified: Secondary | ICD-10-CM

## 2021-06-29 DIAGNOSIS — M6281 Muscle weakness (generalized): Secondary | ICD-10-CM | POA: Diagnosis not present

## 2021-06-29 DIAGNOSIS — R2689 Other abnormalities of gait and mobility: Secondary | ICD-10-CM

## 2021-06-29 NOTE — Therapy (Signed)
[place]. [place], [name], [address] Phone: [phone]   Fax:  [phone] Progress Note Reporting Period 05/30/21 to 06/29/21  See note below for Objective Data and Assessment of Progress/Goals.     Physical Therapy Treatment  Patient Details  Name: Eric [name] MRN: [id number] Date of Birth: [birth year] Referring Provider (PT): Frann Rider   Encounter Date: 06/29/2021   PT End of Session - 06/29/21 0921     Visit Number 10    Date for PT Re-Evaluation 08/22/21    Authorization Type UHC Medicare    PT Start Time 0844    PT Stop Time 0928    PT Time Calculation (min) 44 min    Activity Tolerance Patient tolerated treatment well    Behavior During Therapy [facility] for tasks assessed/performed             Past Medical History:  Diagnosis Date   Atrial flutter, paroxysmal ([place])    a. dx 04-02-2014, s/p  successful cardioversion 04-06-2014.   Chronic anticoagulation    on Eliquis--  due to recurrent dvt's and pe's   Dyslipidemia    History of bladder cancer urologist-  dr [name]   02-13-2016  s/p TURBT per path high grade papillary urothelial carcinoma   History of DVT (deep vein thrombosis)    2000-- RLE   History of melanoma excision    left flank;  07/ 2014 left lower leg and right upper chest   History of prostate cancer    Gleason 6--  s/p  radical prostatectomy 06/ 2000 in [place]---  no recurrence   History of pulmonary embolus (PE)    2000 & 2005  bilateral   Hx of valvuloplasty 06/19/2007   a. 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 05/2007 CABG x 1: s/p SVG-OM;  b. 10/2010 Ex MV: EF 72%, inf attenuation w/o ischemia, brief run of PAT with  exercise. To   Stroke ([place])    Thrombocytopenia ([place])    Thyroid goiter 2013   nodular   Wears hearing aid    BILATERAL   Wears partial dentures    UPPER    Past Surgical History:  Procedure Laterality Date   AORTIC VALVE REPLACEMENT (AVR)/CORONARY ARTERY BYPASS GRAFTING (CABG)  06-19-2007  dr [name]   SVG to OM1 ;   AVR w/ #23 Mitralflow pericardial and ligation left atrial appendage;  MV repair w/ #28 Sorin 3-D memo Ring Annuloplasty with repair ruptured chordar of P-2 flail segments   CARDIAC CATHETERIZATION  05-12-2007  dr Leonia Reeves   single vessel 30% ostial LAD,  80% LCx;  severe to critial AS,  moderate MR,  normal LVF, ef 70%,  normal right heart pressures and cardiac outputs,  mild elevated LV end-diastolic pressure     CARDIOVASCULAR STRESS TEST  11-02-2010  dr Irish Lack   normal nuclear study w/ no ischemia or infarct/scar/  normal LV function and wall motion , ef 72%   CARDIOVERSION N/A 04/06/2014   Procedure: CARDIOVERSION;  Surgeon: [name], MD;  Location: [place];  Service: Cardiovascular;  Laterality: N/A;   successful   CATARACT EXTRACTION W/ INTRAOCULAR LENS  IMPLANT, BILATERAL  2012  approx  CORONARY ARTERY BYPASS GRAFT     PROSTATECTOMY     RETROPUBIC RADICAL PROSTATECTOMY  06/ 2000  in [place], [place]   RIGHT/LEFT HEART CATH AND CORONARY/GRAFT ANGIOGRAPHY N/A 10/29/2018   Procedure: RIGHT/LEFT HEART CATH AND CORONARY/GRAFT ANGIOGRAPHY;  Surgeon: [name], MD;  Location: [place] CV [facility];  Service: Cardiovascular;  Laterality: N/A;   ROTATOR CUFF REPAIR Left 09/1998   TEE WITHOUT CARDIOVERSION N/A 04/06/2014   Procedure: TRANSESOPHAGEAL ECHOCARDIOGRAM (TEE);  Surgeon: [name], MD;  Location: [facility] ENDOSCOPY;  Service: Cardiovascular;  Laterality: N/A;  mild focal basa LVH of the septum, ef 50-55%, s/p AV annuloplasty ring, elongated chordae, thickened leaflets, mild to mod. MR, multiple small jets/arotic bioprosthetic valve sits well in  position, mild AI/ thickened TV w/ mild reurg./mild LA   TEE WITHOUT CARDIOVERSION N/A 10/22/2018   Procedure: TRANSESOPHAGEAL ECHOCARDIOGRAM (TEE);  Surgeon: [name], MD;  Location: [facility] ENDOSCOPY;  Service: Cardiovascular;  Laterality: N/A;   TEE WITHOUT CARDIOVERSION N/A 11/25/2018   Procedure: TRANSESOPHAGEAL ECHOCARDIOGRAM (TEE);  Surgeon: [name], MD;  Location: [place];  Service: Open Heart Surgery;  Laterality: N/A;   TRANSCATHETER AORTIC VALVE REPLACEMENT, TRANSFEMORAL N/A 11/25/2018   Procedure: TRANSCATHETER AORTIC VALVE REPLACEMENT, TRANSFEMORAL;  Surgeon: [name], MD;  Location: [place];  Service: Open Heart Surgery;  Laterality: N/A;   TRANSTHORACIC ECHOCARDIOGRAM  03-23-2016   dr Irish Lack   EF 55-60%, reduced contribution atrial contraction to ventricular filling, due to increased ventricular diastolic pressure or atrial contratile dysfunction/  bioprosthetic AV w/ mod. regurg. (valve area 1.97cm^2)/ mild dilated ascending aorta/ mild thicken MV normal function annular ring prosthesis/severe LAE & mod. to sev.RAE/ PASP 47mHg/ mild TR/ ventricle septal motion show paradox   TRANSURETHRAL RESECTION OF BLADDER TUMOR N/A 02/13/2016   Procedure: TRANSURETHRAL RESECTION OF BLADDER TUMOR (TURBT);  Surgeon: [name] MD;  [facility]  Service: Urology;  Laterality: N/A;   TRANSURETHRAL RESECTION OF BLADDER TUMOR N/A 09/04/2016   Procedure: TRANSURETHRAL RESECTION OF BLADDER TUMOR (TURBT);  Surgeon: [name] MD;  [facility]  Service: Urology;  Laterality: N/A;    There were no vitals filed for this visit.   Subjective Assessment - 06/29/21 0845     Subjective I think I have recovered from the fall.    Currently in Pain? No/denies                               [facility] Adult PT Treatment/Exercise - 06/29/21 0001       Ambulation/Gait   Gait Comments outside around building and  then in the grass challenging his balance, stairs step over step holding weighted ball      High Level Balance   High Level Balance Activities Sudden stops    High Level Balance Comments foot on 8" and then a 4" step with ball toss, very difficult for him with the 8", seated ball slams, weighted ball sit to stand tosses, side stepping on and off airex, airex balance beam side stepping and tandem, back to wall and trying to touch back side to wall and come back using core and trunk mms                       PT Short Term Goals - 06/06/21 0932       PT SHORT TERM GOAL #1   Title Pt will be I  and compliant with initial HEP.    Status Achieved               PT Long Term Goals - 06/29/21 0927       PT LONG TERM GOAL #1   Title Pt will be I and compliant with HEP.    Status Partially Met      PT LONG TERM GOAL #3   Title decrease TUG time to 12 seconds    Status Partially Met                   Plan - 06/29/21 0922     Clinical Impression Statement Patient did well today, much less issues with the dynamic siurfaces, still issues but did better, I added a hip strategy work today since he tends to lose balance backward and always steps tried to get him to shift COG with the hips, it took a lot of practice and cues to get this    PT Next Visit Plan really push balance activities and function    Consulted and Agree with Plan of Care Patient             Patient will benefit from skilled therapeutic intervention in order to improve the following deficits and impairments:  Abnormal gait, Decreased coordination, Difficulty walking, Cardiopulmonary status limiting activity, Decreased endurance, Decreased activity tolerance, Decreased balance, Postural dysfunction, Decreased strength, Decreased mobility  Visit Diagnosis: Muscle weakness (generalized)  Other abnormalities of gait and mobility  Difficulty in walking, not elsewhere classified     Problem  List Patient Active Problem List   Diagnosis Date Noted   Cerebral thrombosis with cerebral infarction 11/25/2020   Acute right-sided weakness 11/24/2020   History of mitral valve repair 11/26/2018   Prosthetic valve dysfunction 11/25/2018   Ectatic abdominal aorta ([place]) 11/25/2018   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 [place], [name], [address] Phone: [phone]   Fax:  [phone]  Name: Eric [name] MRN: [id number] Date of Birth: Oct 13, 1929

## 2021-07-04 ENCOUNTER — Ambulatory Visit: Payer: Medicare Other | Admitting: Physical Therapy

## 2021-07-04 ENCOUNTER — Encounter: Payer: Self-pay | Admitting: Physical Therapy

## 2021-07-04 ENCOUNTER — Other Ambulatory Visit: Payer: Self-pay

## 2021-07-04 DIAGNOSIS — R262 Difficulty in walking, not elsewhere classified: Secondary | ICD-10-CM

## 2021-07-04 DIAGNOSIS — R278 Other lack of coordination: Secondary | ICD-10-CM

## 2021-07-04 DIAGNOSIS — M6281 Muscle weakness (generalized): Secondary | ICD-10-CM | POA: Diagnosis not present

## 2021-07-04 DIAGNOSIS — R2689 Other abnormalities of gait and mobility: Secondary | ICD-10-CM

## 2021-07-04 NOTE — Therapy (Signed)
[place]. [place], [name], [address] Phone: [phone]   Fax:  [phone]  Physical Therapy Treatment  Patient Details  Name: Eric [name] MRN: [id number] Date of Birth: 01-Dec-1929 Referring Provider (PT): Frann Rider   Encounter Date: 07/04/2021   PT End of Session - 07/04/21 0926     Visit Number 11    PT Start Time 0845    PT Stop Time 0929    PT Time Calculation (min) 44 min    Activity Tolerance Patient tolerated treatment well    Behavior During Therapy [facility] for tasks assessed/performed             Past Medical History:  Diagnosis Date   Atrial flutter, paroxysmal ([place])    a. dx 04-02-2014, s/p  successful cardioversion 04-06-2014.   Chronic anticoagulation    on Eliquis--  due to recurrent dvt's and pe's   Dyslipidemia    History of bladder cancer urologist-  dr [name]   02-13-2016  s/p TURBT per path high grade papillary urothelial carcinoma   History of DVT (deep vein thrombosis)    2000-- RLE   History of melanoma excision    left flank;  07/ 2014 left lower leg and right upper chest   History of prostate cancer    Gleason 6--  s/p  radical prostatectomy 06/ 2000 in [place]---  no recurrence   History of pulmonary embolus (PE)    2000 & 2005  bilateral   Hx of valvuloplasty 06/19/2007   a. 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 05/2007 CABG x 1: s/p SVG-OM;  b. 10/2010 Ex MV: EF 72%, inf attenuation w/o ischemia, brief run of PAT with exercise. To   Stroke ([place])    Thrombocytopenia ([place])    Thyroid goiter 2013   nodular   Wears hearing aid    BILATERAL   Wears partial dentures    UPPER    Past Surgical History:   Procedure Laterality Date   AORTIC VALVE REPLACEMENT (AVR)/CORONARY ARTERY BYPASS GRAFTING (CABG)  06-19-2007  dr [name]   SVG to OM1 ;   AVR w/ #23 Mitralflow pericardial and ligation left atrial appendage;  MV repair w/ #28 Sorin 3-D memo Ring Annuloplasty with repair ruptured chordar of P-2 flail segments   CARDIAC CATHETERIZATION  05-12-2007  dr Leonia Reeves   single vessel 30% ostial LAD,  80% LCx;  severe to critial AS,  moderate MR,  normal LVF, ef 70%,  normal right heart pressures and cardiac outputs,  mild elevated LV end-diastolic pressure     CARDIOVASCULAR STRESS TEST  11-02-2010  dr Irish Lack   normal nuclear study w/ no ischemia or infarct/scar/  normal LV function and wall motion , ef 72%   CARDIOVERSION N/A 04/06/2014   Procedure: CARDIOVERSION;  Surgeon: [name], MD;  Location: [place];  Service: Cardiovascular;  Laterality: N/A;   successful   CATARACT EXTRACTION W/ INTRAOCULAR LENS  IMPLANT, BILATERAL  2012  approx   CORONARY ARTERY BYPASS GRAFT     PROSTATECTOMY     RETROPUBIC RADICAL PROSTATECTOMY  06/ 2000  in [place]   RIGHT/LEFT HEART CATH AND CORONARY/GRAFT ANGIOGRAPHY N/A 10/29/2018  Procedure: RIGHT/LEFT HEART CATH AND CORONARY/GRAFT ANGIOGRAPHY;  Surgeon: [name], MD;  Location: [place] CV LAB;  Service: Cardiovascular;  Laterality: N/A;   ROTATOR CUFF REPAIR Left 09/1998   TEE WITHOUT CARDIOVERSION N/A 04/06/2014   Procedure: TRANSESOPHAGEAL ECHOCARDIOGRAM (TEE);  Surgeon: [name], MD;  Location: [facility] ENDOSCOPY;  Service: Cardiovascular;  Laterality: N/A;  mild focal basa LVH of the septum, ef 50-55%, s/p AV annuloplasty ring, elongated chordae, thickened leaflets, mild to mod. MR, multiple small jets/arotic bioprosthetic valve sits well in position, mild AI/ thickened TV w/ mild reurg./mild LA   TEE WITHOUT CARDIOVERSION N/A 10/22/2018   Procedure: TRANSESOPHAGEAL ECHOCARDIOGRAM (TEE);  Surgeon: [name], MD;  Location[facility]  ENDOSCOPY;  Service: Cardiovascular;  Laterality: N/A;   TEE WITHOUT CARDIOVERSION N/A 11/25/2018   Procedure: TRANSESOPHAGEAL ECHOCARDIOGRAM (TEE);  Surgeon: [name], MD;  Location: [place];  Service: Open Heart Surgery;  Laterality: N/A;   TRANSCATHETER AORTIC VALVE REPLACEMENT, TRANSFEMORAL N/A 11/25/2018   Procedure: TRANSCATHETER AORTIC VALVE REPLACEMENT, TRANSFEMORAL;  Surgeon: [name], MD;  Location: [place];  Service: Open Heart Surgery;  Laterality: N/A;   TRANSTHORACIC ECHOCARDIOGRAM  03-23-2016   dr Irish Lack   EF 55-60%, reduced contribution atrial contraction to ventricular filling, due to increased ventricular diastolic pressure or atrial contratile dysfunction/  bioprosthetic AV w/ mod. regurg. (valve area 1.97cm^2)/ mild dilated ascending aorta/ mild thicken MV normal function annular ring prosthesis/severe LAE & mod. to sev.RAE/ PASP 72mHg/ mild TR/ ventricle septal motion show paradox   TRANSURETHRAL RESECTION OF BLADDER TUMOR N/A 02/13/2016   Procedure: TRANSURETHRAL RESECTION OF BLADDER TUMOR (TURBT);  Surgeon: [name] MD;  [facility]  Service: Urology;  Laterality: N/A;   TRANSURETHRAL RESECTION OF BLADDER TUMOR N/A 09/04/2016   Procedure: TRANSURETHRAL RESECTION OF BLADDER TUMOR (TURBT);  Surgeon: [name] MD;  [facility]  Service: Urology;  Laterality: N/A;    There were no vitals filed for this visit.   Subjective Assessment - 07/04/21 0848     Subjective "Good"    Currently in Pain? No/denies                               [facility] - [facility]       High Level Balance   High Level Balance Comments Ball toss on airex      Knee/Hip Exercises: Aerobic   Recumbent Bike L 5 x 4 min    Nustep L5 x 6 min      Knee/Hip Exercises: Machines for Strengthening   Cybex Knee Extension 10lb 2x12    Cybex Knee Flexion 35lb 2x12       Knee/Hip Exercises: Standing   Heel Raises 2 sets;Both;15 reps;2 seconds    Lateral Step Up Both;1 set;10 reps;Hand Hold: 0;Step Height: 6"    Forward Step Up Both;1 set;10 reps;Hand Hold: 0;Step Height: 6"    Walking with Sports Cord 40lb side steps x5      Knee/Hip Exercises: Seated   Sit to Sand 2 sets;10 reps;without UE support   LE on aires                      PT Short Term Goals - 06/06/21 0932       PT SHORT TERM GOAL #1   Title Pt will be I and compliant with initial HEP.    Status  Achieved               PT Long Term Goals - 06/29/21 2458       PT LONG TERM GOAL #1   Title Pt will be I and compliant with HEP.    Status Partially Met      PT LONG TERM GOAL #3   Title decrease TUG time to 12 seconds    Status Partially Met                   Plan - 07/04/21 0926     Clinical Impression Statement Pt enters clinic feeling fine. He had some issues with stability doing step ups interventions requiring CGA to Maintain stability. Increase reps tolerated with seated leg curls and extensions. Good stability with sit to stands on airex, , but ball toss on airex caused some instability.    Rehab Potential Good    PT Frequency 2x / week    PT Duration 12 weeks    PT Next Visit Plan Balance and functional strenght             Patient will benefit from skilled therapeutic intervention in order to improve the following deficits and impairments:     Visit Diagnosis: Muscle weakness (generalized)  Other abnormalities of gait and mobility  Other lack of coordination  Difficulty in walking, not elsewhere classified     Problem List Patient Active Problem List   Diagnosis Date Noted   Cerebral thrombosis with cerebral infarction 11/25/2020   Acute right-sided weakness 11/24/2020   History of mitral valve repair 11/26/2018   Prosthetic valve dysfunction 11/25/2018   Ectatic abdominal aorta ([place]) 11/25/2018   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 [place], [name], [address] Phone: [phone]   Fax:  [phone]  Name: Eric [name] MRN: [id number] Date of Birth: 08-15-1929

## 2021-07-06 ENCOUNTER — Encounter: Payer: Self-pay | Admitting: Physical Therapy

## 2021-07-06 ENCOUNTER — Ambulatory Visit: Payer: Medicare Other | Admitting: Physical Therapy

## 2021-07-06 ENCOUNTER — Other Ambulatory Visit: Payer: Self-pay

## 2021-07-06 DIAGNOSIS — R278 Other lack of coordination: Secondary | ICD-10-CM

## 2021-07-06 DIAGNOSIS — M6281 Muscle weakness (generalized): Secondary | ICD-10-CM | POA: Diagnosis not present

## 2021-07-06 DIAGNOSIS — R262 Difficulty in walking, not elsewhere classified: Secondary | ICD-10-CM

## 2021-07-06 DIAGNOSIS — R2689 Other abnormalities of gait and mobility: Secondary | ICD-10-CM

## 2021-07-06 NOTE — Therapy (Signed)
[place]. [place], [name], [address] Phone: [phone]   Fax:  [phone]  Physical Therapy Treatment  Patient Details  Name: Eric [name] MRN: [id number] Date of Birth: 16-Nov-1929 Referring Provider (PT): Frann Rider   Encounter Date: 07/06/2021   PT End of Session - 07/06/21 1405     Visit Number 12    Date for PT Re-Evaluation 08/22/21    Authorization Type [employer] Medicare    PT Start Time 2947    PT Stop Time 6546    PT Time Calculation (min) 49 min    Activity Tolerance Patient tolerated treatment well    Behavior During Therapy [facility] for tasks assessed/performed             Past Medical History:  Diagnosis Date   Atrial flutter, paroxysmal ([place])    a. dx 04-02-2014, s/p  successful cardioversion 04-06-2014.   Chronic anticoagulation    on Eliquis--  due to recurrent dvt's and pe's   Dyslipidemia    History of bladder cancer urologist-  dr [name]   02-13-2016  s/p TURBT per path high grade papillary urothelial carcinoma   History of DVT (deep vein thrombosis)    2000-- RLE   History of melanoma excision    left flank;  07/ 2014 left lower leg and right upper chest   History of prostate cancer    Gleason 6--  s/p  radical prostatectomy 06/ 2000 in [place]---  no recurrence   History of pulmonary embolus (PE)    2000 & 2005  bilateral   Hx of valvuloplasty 06/19/2007   a. 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 05/2007 CABG x 1: s/p SVG-OM;  b. 10/2010 Ex MV: EF 72%, inf attenuation w/o ischemia, brief run of PAT with exercise. To   Stroke ([place])    Thrombocytopenia ([place])    Thyroid goiter 2013   nodular   Wears hearing aid    BILATERAL    Wears partial dentures    UPPER    Past Surgical History:  Procedure Laterality Date   AORTIC VALVE REPLACEMENT (AVR)/CORONARY ARTERY BYPASS GRAFTING (CABG)  06-19-2007  dr [name]   SVG to OM1 ;   AVR w/ #23 Mitralflow pericardial and ligation left atrial appendage;  MV repair w/ #28 Sorin 3-D memo Ring Annuloplasty with repair ruptured chordar of P-2 flail segments   CARDIAC CATHETERIZATION  05-12-2007  dr Leonia Reeves   single vessel 30% ostial LAD,  80% LCx;  severe to critial AS,  moderate MR,  normal LVF, ef 70%,  normal right heart pressures and cardiac outputs,  mild elevated LV end-diastolic pressure     CARDIOVASCULAR STRESS TEST  11-02-2010  dr Irish Lack   normal nuclear study w/ no ischemia or infarct/scar/  normal LV function and wall motion , ef 72%   CARDIOVERSION N/A 04/06/2014   Procedure: CARDIOVERSION;  Surgeon: [name], MD;  Location: [place];  Service: Cardiovascular;  Laterality: N/A;   successful   CATARACT EXTRACTION W/ INTRAOCULAR LENS  IMPLANT, BILATERAL  2012  approx   CORONARY ARTERY BYPASS GRAFT     PROSTATECTOMY     RETROPUBIC RADICAL PROSTATECTOMY  06/ 2000  in [place]   RIGHT/LEFT HEART CATH AND CORONARY/GRAFT ANGIOGRAPHY N/A 10/29/2018   Procedure: RIGHT/LEFT HEART CATH AND CORONARY/GRAFT ANGIOGRAPHY;  Surgeon: [name], MD;  Location: [place] CV LAB;  Service: Cardiovascular;  Laterality: N/A;   ROTATOR CUFF REPAIR Left 09/1998   TEE WITHOUT CARDIOVERSION N/A 04/06/2014   Procedure: TRANSESOPHAGEAL ECHOCARDIOGRAM (TEE);  Surgeon: [name], MD;  Location: [facility] ENDOSCOPY;  Service: Cardiovascular;  Laterality: N/A;  mild focal basa LVH of the septum, ef 50-55%, s/p AV annuloplasty ring, elongated chordae, thickened leaflets, mild to mod. MR, multiple small jets/arotic bioprosthetic valve sits well in position, mild AI/ thickened TV w/ mild reurg./mild LA   TEE WITHOUT CARDIOVERSION N/A 10/22/2018   Procedure: TRANSESOPHAGEAL  ECHOCARDIOGRAM (TEE);  Surgeon: [name], MD;  Location: [facility] ENDOSCOPY;  Service: Cardiovascular;  Laterality: N/A;   TEE WITHOUT CARDIOVERSION N/A 11/25/2018   Procedure: TRANSESOPHAGEAL ECHOCARDIOGRAM (TEE);  Surgeon: [name], MD;  Location: [place];  Service: Open Heart Surgery;  Laterality: N/A;   TRANSCATHETER AORTIC VALVE REPLACEMENT, TRANSFEMORAL N/A 11/25/2018   Procedure: TRANSCATHETER AORTIC VALVE REPLACEMENT, TRANSFEMORAL;  Surgeon: [name], MD;  Location: [place];  Service: Open Heart Surgery;  Laterality: N/A;   TRANSTHORACIC ECHOCARDIOGRAM  03-23-2016   dr Irish Lack   EF 55-60%, reduced contribution atrial contraction to ventricular filling, due to increased ventricular diastolic pressure or atrial contratile dysfunction/  bioprosthetic AV w/ mod. regurg. (valve area 1.97cm^2)/ mild dilated ascending aorta/ mild thicken MV normal function annular ring prosthesis/severe LAE & mod. to sev.RAE/ PASP 31mHg/ mild TR/ ventricle septal motion show paradox   TRANSURETHRAL RESECTION OF BLADDER TUMOR N/A 02/13/2016   Procedure: TRANSURETHRAL RESECTION OF BLADDER TUMOR (TURBT);  Surgeon: [name] MD;  [facility]  Service: Urology;  Laterality: N/A;   TRANSURETHRAL RESECTION OF BLADDER TUMOR N/A 09/04/2016   Procedure: TRANSURETHRAL RESECTION OF BLADDER TUMOR (TURBT);  Surgeon: [name] MD;  [facility]  Service: Urology;  Laterality: N/A;    There were no vitals filed for this visit.   Subjective Assessment - 07/06/21 1316     Subjective No falls    Currently in Pain? No/denies                               [facility] Adult PT Treatment/Exercise - 07/06/21 0001       Ambulation/Gait   Gait Comments outside around building and then in the grass challenging his balance, stairs step over step holding weighted ball, over pine needles, up a steep slope      High Level Balance   High  Level Balance Comments toe touches outside on curb, worked some on hip strategy close to wall having to touch buttocks or pelvis to wall to get the hips moving, also worked some on getting up fro the floor two different ways      Knee/Hip Exercises: Aerobic   Nustep L5 x 6 min      Knee/Hip Exercises: Machines for Strengthening   Cybex Knee Extension 10lb 2x12    Cybex Knee Flexion 35lb 2x12    Hip Cybex 5# hip abduction and extension 2x10, 25# rows and lats, 15# chest press    Other Machine straight arm pulls 10# 2x10, then resisted gait 40# all directions  PT Short Term Goals - 06/06/21 0932       PT SHORT TERM GOAL #1   Title Pt will be I and compliant with initial HEP.    Status Achieved               PT Long Term Goals - 07/06/21 1407       PT LONG TERM GOAL #1   Title Pt will be I and compliant with HEP.    Status Partially Met      PT LONG TERM GOAL #2   Title Pt will improv BERG balance score 47/56 to show improved balance    Status Partially Met      PT LONG TERM GOAL #3   Title decrease TUG time to 12 seconds    Status Partially Met                   Plan - 07/06/21 1405     Clinical Impression Statement Did a long walk and worked on the uneven terrain, worked on getting up from [employer] and worked on some hip strategies.  I think we need to keep with the hip strategies and then also work on getting up from the floor wihtout anything to hold onto a he did well today with something to hold onto    PT Next Visit Plan work on getting up from floor without anything    Consulted and Agree with Plan of Care Patient             Patient will benefit from skilled therapeutic intervention in order to improve the following deficits and impairments:  Abnormal gait, Decreased coordination, Difficulty walking, Cardiopulmonary status limiting activity, Decreased endurance, Decreased activity tolerance, Decreased balance,  Postural dysfunction, Decreased strength, Decreased mobility  Visit Diagnosis: Muscle weakness (generalized)  Other abnormalities of gait and mobility  Other lack of coordination  Difficulty in walking, not elsewhere classified     Problem List Patient Active Problem List   Diagnosis Date Noted   Cerebral thrombosis with cerebral infarction 11/25/2020   Acute right-sided weakness 11/24/2020   History of mitral valve repair 11/26/2018   Prosthetic valve dysfunction 11/25/2018   Ectatic abdominal aorta ([place]) 11/25/2018   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 [place], [name], [address] Phone: [phone]   Fax:  [phone]  Name: YENG PERZ MRN: [id number] Date of Birth: Aug 23, 1929

## 2021-07-11 ENCOUNTER — Encounter: Payer: Self-pay | Admitting: Physical Therapy

## 2021-07-11 ENCOUNTER — Other Ambulatory Visit: Payer: Self-pay

## 2021-07-11 ENCOUNTER — Ambulatory Visit: Payer: Medicare Other | Admitting: Physical Therapy

## 2021-07-11 DIAGNOSIS — R262 Difficulty in walking, not elsewhere classified: Secondary | ICD-10-CM

## 2021-07-11 DIAGNOSIS — M6281 Muscle weakness (generalized): Secondary | ICD-10-CM | POA: Diagnosis not present

## 2021-07-11 DIAGNOSIS — R2689 Other abnormalities of gait and mobility: Secondary | ICD-10-CM

## 2021-07-11 NOTE — Therapy (Signed)
[place] [place], [name], [address] Phone: [phone]   Fax:  [phone]  Physical Therapy Treatment  Patient Details  Name: Eric [name] MRN: [id number] Date of Birth: Feb 15, 1930 Referring Provider (PT): Frann Rider   Encounter Date: 07/11/2021   PT End of Session - 07/11/21 0928     Visit Number 13    Date for PT Re-Evaluation 08/22/21    Authorization Type UHC Medicare    PT Start Time 0845    PT Stop Time 0928    PT Time Calculation (min) 43 min    Activity Tolerance Patient tolerated treatment well    Behavior During Therapy [facility] for tasks assessed/performed             Past Medical History:  Diagnosis Date   Atrial flutter, paroxysmal ([place])    a. dx 04-02-2014, s/p  successful cardioversion 04-06-2014.   Chronic anticoagulation    on Eliquis--  due to recurrent dvt's and pe's   Dyslipidemia    History of bladder cancer urologist-  dr [name]   02-13-2016  s/p TURBT per path high grade papillary urothelial carcinoma   History of DVT (deep vein thrombosis)    2000-- RLE   History of melanoma excision    left flank;  07/ 2014 left lower leg and right upper chest   History of prostate cancer    Gleason 6--  s/p  radical prostatectomy 06/ 2000 in [place]---  no recurrence   History of pulmonary embolus (PE)    2000 & 2005  bilateral   Hx of valvuloplasty 06/19/2007   a. 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 05/2007 CABG x 1: s/p SVG-OM;  b. 10/2010 Ex MV: EF 72%, inf attenuation w/o ischemia, brief run of PAT with exercise. To   Stroke ([place])    Thrombocytopenia ([place])    Thyroid goiter 2013   nodular   Wears hearing aid    BILATERAL    Wears partial dentures    UPPER    Past Surgical History:  Procedure Laterality Date   AORTIC VALVE REPLACEMENT (AVR)/CORONARY ARTERY BYPASS GRAFTING (CABG)  06-19-2007  dr [name]   SVG to OM1 ;   AVR w/ #23 Mitralflow pericardial and ligation left atrial appendage;  MV repair w/ #28 Sorin 3-D memo Ring Annuloplasty with repair ruptured chordar of P-2 flail segments   CARDIAC CATHETERIZATION  05-12-2007  dr Leonia Reeves   single vessel 30% ostial LAD,  80% LCx;  severe to critial AS,  moderate MR,  normal LVF, ef 70%,  normal right heart pressures and cardiac outputs,  mild elevated LV end-diastolic pressure     CARDIOVASCULAR STRESS TEST  11-02-2010  dr Irish Lack   normal nuclear study w/ no ischemia or infarct/scar/  normal LV function and wall motion , ef 72%   CARDIOVERSION N/A 04/06/2014   Procedure: CARDIOVERSION;  Surgeon: [name], MD;  Location: [place];  Service: Cardiovascular;  Laterality: N/A;   successful   CATARACT EXTRACTION W/ INTRAOCULAR LENS  IMPLANT, BILATERAL  2012  approx   CORONARY ARTERY BYPASS GRAFT     PROSTATECTOMY     RETROPUBIC RADICAL PROSTATECTOMY  06/ 2000  in [place]   RIGHT/LEFT HEART CATH AND CORONARY/GRAFT ANGIOGRAPHY N/A 10/29/2018   Procedure: RIGHT/LEFT HEART CATH AND CORONARY/GRAFT ANGIOGRAPHY;  Surgeon: [name], MD;  Location: [facility] [facility];  Service: Cardiovascular;  Laterality: N/A;   ROTATOR CUFF REPAIR Left 09/1998   TEE WITHOUT CARDIOVERSION N/A 04/06/2014   Procedure: TRANSESOPHAGEAL ECHOCARDIOGRAM (TEE);  Surgeon: [name], MD;  Location: [facility] ENDOSCOPY;  Service: Cardiovascular;  Laterality: N/A;  mild focal basa LVH of the septum, ef 50-55%, s/p AV annuloplasty ring, elongated chordae, thickened leaflets, mild to mod. MR, multiple small jets/arotic bioprosthetic valve sits well in position, mild AI/ thickened TV w/ mild reurg./mild LA   TEE WITHOUT CARDIOVERSION N/A 10/22/2018   Procedure: TRANSESOPHAGEAL  ECHOCARDIOGRAM (TEE);  Surgeon: [name], MD;  [facility] END[facility]COPY;  Service: Cardiovascular;  Laterality: N/A;   TEE WITHOUT CARDIOVERSION N/A 11/25/2018   Procedure: TRANSESOPHAGEAL ECHOCARDIOGRAM (TEE);  Surgeon: [name], MD;  Location: [place];  Service: Open Heart Surgery;  Laterality: N/A;   TRANSCATHETER AORTIC VALVE REPLACEMENT, TRANSFEMORAL N/A 11/25/2018   Procedure: TRANSCATHETER AORTIC VALVE REPLACEMENT, TRANSFEMORAL;  Surgeon: [name], MD;  Location: [place];  Service: Open Heart Surgery;  Laterality: N/A;   TRANSTHORACIC ECHOCARDIOGRAM  03-23-2016   dr Irish Lack   EF 55-60%, reduced contribution atrial contraction to ventricular filling, due to increased ventricular diastolic pressure or atrial contratile dysfunction/  bioprosthetic AV w/ mod. regurg. (valve area 1.97cm^2)/ mild dilated ascending aorta/ mild thicken MV normal function annular ring prosthesis/severe LAE & mod. to sev.RAE/ PASP 73mHg/ mild TR/ ventricle septal motion show paradox   TRANSURETHRAL RESECTION OF BLADDER TUMOR N/A 02/13/2016   Procedure: TRANSURETHRAL RESECTION OF BLADDER TUMOR (TURBT);  Surgeon: [name] MD;  [facility]  Service: Urology;  Laterality: N/A;   TRANSURETHRAL RESECTION OF BLADDER TUMOR N/A 09/04/2016   Procedure: TRANSURETHRAL RESECTION OF BLADDER TUMOR (TURBT);  Surgeon: [name] MD;  [facility]  Service: Urology;  Laterality: N/A;    There were no vitals filed for this visit.   Subjective Assessment - 07/11/21 0842     Subjective "Good" "no news to report"    Currently in Pain? No/denies                               [facility] Adult PT Treatment/Exercise - 07/11/21 0001       Ambulation/Gait   Gait Comments outside around building and then in the grass challenging his balance, stairs step over step holding weighted ball, over pine needles, up a steep slope      Knee/Hip  Exercises: Aerobic   Nustep L5 x 6 min      Knee/Hip Exercises: Machines for Strengthening   Cybex Knee Extension 10lb 2x12    Cybex Knee Flexion 35lb 2x12    Other Machine straight arm pulls 10# 2x10, then resisted gait 40# all directions      Knee/Hip Exercises: Standing   Other Standing Knee Exercises on airex Alt box taps 6in 2x10    Other Standing Knee Exercises 6in step ups x5 each from airex                       PT Short Term Goals - 06/06/21 0932       PT SHORT TERM GOAL #1   Title Pt will be I and compliant with initial HEP.  Status Achieved               PT Long Term Goals - 07/06/21 1407       PT LONG TERM GOAL #1   Title Pt will be I and compliant with HEP.    Status Partially Met      PT LONG TERM GOAL #2   Title Pt will improv BERG balance score 47/56 to show improved balance    Status Partially Met      PT LONG TERM GOAL #3   Title decrease TUG time to 12 seconds    Status Partially Met                   Plan - 07/11/21 0928     Clinical Impression Statement Continues with the long walks over uneven terrain alternating 5lb weight in each hand. Pt tends to drift when towards slope when ambulating across a slope surface. pt also tends to over compensate with stepping up to curbs outside. Difficulty with all stepping interventions from airex. Needed use os UE most of the time negotiating stairs.    Rehab Potential Good    PT Frequency 2x / week    PT Duration 12 weeks    PT Treatment/Interventions ADLs/Self Care Home Management;Gait training;Neuromuscular re-education;Balance training;Therapeutic exercise;Therapeutic activities;Functional mobility training;Stair training;Patient/family education;Manual techniques    PT Next Visit Plan work on getting up from floor without anything             Patient will benefit from skilled therapeutic intervention in order to improve the following deficits and impairments:  Abnormal  gait, Decreased coordination, Difficulty walking, Cardiopulmonary status limiting activity, Decreased endurance, Decreased activity tolerance, Decreased balance, Postural dysfunction, Decreased strength, Decreased mobility  Visit Diagnosis: Muscle weakness (generalized)  Difficulty in walking, not elsewhere classified  Other abnormalities of gait and mobility     Problem List Patient Active Problem List   Diagnosis Date Noted   Cerebral thrombosis with cerebral infarction 11/25/2020   Acute right-sided weakness 11/24/2020   History of mitral valve repair 11/26/2018   Prosthetic valve dysfunction 11/25/2018   Ectatic abdominal aorta ([place]) 11/25/2018   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 [place], [name], [address] Phone: [phone]   Fax:  [phone]  Name: BURK HOCTOR MRN: [id number] Date of Birth: 29-Apr-1930

## 2021-07-13 ENCOUNTER — Encounter: Payer: Self-pay | Admitting: Physical Therapy

## 2021-07-13 ENCOUNTER — Ambulatory Visit: Payer: Medicare Other | Admitting: Physical Therapy

## 2021-07-13 ENCOUNTER — Other Ambulatory Visit: Payer: Self-pay

## 2021-07-13 DIAGNOSIS — M6281 Muscle weakness (generalized): Secondary | ICD-10-CM | POA: Diagnosis not present

## 2021-07-13 DIAGNOSIS — R262 Difficulty in walking, not elsewhere classified: Secondary | ICD-10-CM

## 2021-07-13 DIAGNOSIS — R2689 Other abnormalities of gait and mobility: Secondary | ICD-10-CM

## 2021-07-13 NOTE — Therapy (Signed)
[place]. [place], [name], [address] Phone: [phone]   Fax:  [phone]  Physical Therapy Treatment  Patient Details  Name: Eric [name] MRN: [id number] Date of Birth: 09-26-1929 Referring Provider (PT): Frann Rider   Encounter Date: 07/13/2021   PT End of Session - 07/13/21 0932     Visit Number 14    Date for PT Re-Evaluation 08/22/21    Authorization Type UHC Medicare    PT Start Time 5003    PT Stop Time 0932    PT Time Calculation (min) 48 min    Activity Tolerance Patient tolerated treatment well    Behavior During Therapy [facility] for tasks assessed/performed             Past Medical History:  Diagnosis Date   Atrial flutter, paroxysmal ([place])    a. dx 04-02-2014, s/p  successful cardioversion 04-06-2014.   Chronic anticoagulation    on Eliquis--  due to recurrent dvt's and pe's   Dyslipidemia    History of bladder cancer urologist-  dr [name]   02-13-2016  s/p TURBT per path high grade papillary urothelial carcinoma   History of DVT (deep vein thrombosis)    2000-- RLE   History of melanoma excision    left flank;  07/ 2014 left lower leg and right upper chest   History of prostate cancer    Gleason 6--  s/p  radical prostatectomy 06/ 2000 in [place]---  no recurrence   History of pulmonary embolus (PE)    2000 & 2005  bilateral   Hx of valvuloplasty 06/19/2007   a. 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 05/2007 CABG x 1: s/p SVG-OM;  b. 10/2010 Ex MV: EF 72%, inf attenuation w/o ischemia, brief run of PAT with exercise. To   Stroke ([place])    Thrombocytopenia ([place])    Thyroid goiter 2013   nodular   Wears hearing aid    BILATERAL    Wears partial dentures    UPPER    Past Surgical History:  Procedure Laterality Date   AORTIC VALVE REPLACEMENT (AVR)/CORONARY ARTERY BYPASS GRAFTING (CABG)  06-19-2007  dr [name]   SVG to OM1 ;   AVR w/ #23 Mitralflow pericardial and ligation left atrial appendage;  MV repair w/ #28 Sorin 3-D memo Ring Annuloplasty with repair ruptured chordar of P-2 flail segments   CARDIAC CATHETERIZATION  05-12-2007  dr Leonia Reeves   single vessel 30% ostial LAD,  80% LCx;  severe to critial AS,  moderate MR,  normal LVF, ef 70%,  normal right heart pressures and cardiac outputs,  mild elevated LV end-diastolic pressure     CARDIOVASCULAR STRESS TEST  11-02-2010  dr Irish Lack   normal nuclear study w/ no ischemia or infarct/scar/  normal LV function and wall motion , ef 72%   CARDIOVERSION N/A 04/06/2014   Procedure: CARDIOVERSION;  Surgeon: [name], MD;  Location: [place];  Service: Cardiovascular;  Laterality: N/A;   successful   CATARACT EXTRACTION W/ INTRAOCULAR LENS  IMPLANT, BILATERAL  2012  approx   CORONARY ARTERY BYPASS GRAFT     PROSTATECTOMY     RETROPUBIC RADICAL PROSTATECTOMY  06/ 2000  in [place], [place]   RIGHT/LEFT HEART CATH AND CORONARY/GRAFT ANGIOGRAPHY N/A 10/29/2018   Procedure: RIGHT/LEFT HEART CATH AND CORONARY/GRAFT ANGIOGRAPHY;  Surgeon: [name], MD;  Location: [facility] [facility];  Service: Cardiovascular;  Laterality: N/A;   ROTATOR CUFF REPAIR Left 09/1998   TEE WITHOUT CARDIOVERSION N/A 04/06/2014   Procedure: TRANSESOPHAGEAL ECHOCARDIOGRAM (TEE);  Surgeon: [name], MD;  Location: [facility] ENDOSCOPY;  Service: Cardiovascular;  Laterality: N/A;  mild focal basa LVH of the septum, ef 50-55%, s/p AV annuloplasty ring, elongated chordae, thickened leaflets, mild to mod. MR, multiple small jets/arotic bioprosthetic valve sits well in position, mild AI/ thickened TV w/ mild reurg./mild LA   TEE WITHOUT CARDIOVERSION N/A 10/22/2018   Procedure: TRANSESOPHAGEAL  ECHOCARDIOGRAM (TEE);  Surgeon: [name], MD;  Location: [facility] ENDOSCOPY;  Service: Cardiovascular;  Laterality: N/A;   TEE WITHOUT CARDIOVERSION N/A 11/25/2018   Procedure: TRANSESOPHAGEAL ECHOCARDIOGRAM (TEE);  Surgeon: [name], MD;  Location: [place];  Service: Open Heart Surgery;  Laterality: N/A;   TRANSCATHETER AORTIC VALVE REPLACEMENT, TRANSFEMORAL N/A 11/25/2018   Procedure: TRANSCATHETER AORTIC VALVE REPLACEMENT, TRANSFEMORAL;  Surgeon: [name], MD;  Location: [place];  Service: Open Heart Surgery;  Laterality: N/A;   TRANSTHORACIC ECHOCARDIOGRAM  03-23-2016   dr Irish Lack   EF 55-60%, reduced contribution atrial contraction to ventricular filling, due to increased ventricular diastolic pressure or atrial contratile dysfunction/  bioprosthetic AV w/ mod. regurg. (valve area 1.97cm^2)/ mild dilated ascending aorta/ mild thicken MV normal function annular ring prosthesis/severe LAE & mod. to sev.RAE/ PASP 36mHg/ mild TR/ ventricle septal motion show paradox   TRANSURETHRAL RESECTION OF BLADDER TUMOR N/A 02/13/2016   Procedure: TRANSURETHRAL RESECTION OF BLADDER TUMOR (TURBT);  Surgeon: [name] MD;  [facility]  Service: Urology;  Laterality: N/A;   TRANSURETHRAL RESECTION OF BLADDER TUMOR N/A 09/04/2016   Procedure: TRANSURETHRAL RESECTION OF BLADDER TUMOR (TURBT);  Surgeon: [name] MD;  [facility]  Service: Urology;  Laterality: N/A;    There were no vitals filed for this visit.   Subjective Assessment - 07/13/21 0848     Subjective No falls                               [facility] Adult PT Treatment/Exercise - 07/13/21 0001       Transfers   Comments used the mat and helped him down onto the floor and then did some problem solving on getting up and he was able to do on the first try with SBA      Ambulation/Gait   Gait Comments outside around building and then in the grass  challenging his balance, stairs step over step holding weighted ball, over pine needles, up a steep slope      High Level Balance   High Level Balance Comments on airex eyes closed, on airex reaching in front, walking ball toss, fwd, bkwd, side to side      Knee/Hip Exercises: Aerobic   Nustep L5 x 6 min      Knee/Hip Exercises: Standing   Other Standing Knee Exercises on airex Alt box taps 6in 2x10    Other Standing Knee Exercises 20# farmer's carry, toe walk and heel walk, sit/stand/throw/catch with weighted ball                       PT Short Term Goals - 06/06/21 00174  PT SHORT TERM GOAL #1   Title Pt will be I and compliant with initial HEP.    Status Achieved               PT Long Term Goals - 07/13/21 7711       PT LONG TERM GOAL #4   Title increase knee and ankle strength to 4+/5    Status Partially Met      PT LONG TERM GOAL #5   Title right ankle DF to 5 degrees actively    Status Partially Met                   Plan - 07/13/21 0933     Clinical Impression Statement I added some toe and whhe walking, this was very difficult for him due to strenth and some balance, we did some work getting up from the floor and he did well just needs SBA.  The first time he gets on the airex he really struggles with sinking back on the heels and losing his balance, he will use step strategy to correct and I am there with SBA but rarely do I have to correct.  I used the thick foam mat for figure eights never lost balance but difficult with small steps and had to go very slow.    PT Next Visit Plan continue to work on the higher level balanc,e ankle strength and getting up from the floor    Consulted and Agree with Plan of Care Patient             Patient will benefit from skilled therapeutic intervention in order to improve the following deficits and impairments:  Abnormal gait, Decreased coordination, Difficulty walking, Cardiopulmonary status  limiting activity, Decreased endurance, Decreased activity tolerance, Decreased balance, Postural dysfunction, Decreased strength, Decreased mobility  Visit Diagnosis: Muscle weakness (generalized)  Difficulty in walking, not elsewhere classified  Other abnormalities of gait and mobility     Problem List Patient Active Problem List   Diagnosis Date Noted   Cerebral thrombosis with cerebral infarction 11/25/2020   Acute right-sided weakness 11/24/2020   History of mitral valve repair 11/26/2018   Prosthetic valve dysfunction 11/25/2018   Ectatic abdominal aorta ([place]) 11/25/2018   S/P valve-in-valve TAVR 11/25/2018   History of bladder cancer    History of pulmonary embolus (PE)    Acute on chronic diastolic heart failure ([place]) 04/23/2018   History of aortic valve replacement 04/23/2018   Atrial fibrillation ([place]) 04/23/2018   Coronary atherosclerosis of native coronary artery 02/25/2014   Hyperlipidemia 02/25/2014   Embolism and thrombosis ([place]) 03/11/2013    Sumner Boast, PT 07/13/2021, 9:39 AM  [place]. [place], [name], [address] Phone: [phone]   Fax:  [phone]  Name: ERINN HUSKINS MRN: [id number] Date of Birth: [birth year]

## 2021-07-18 ENCOUNTER — Encounter: Payer: Self-pay | Admitting: Physical Therapy

## 2021-07-18 ENCOUNTER — Other Ambulatory Visit: Payer: Self-pay

## 2021-07-18 ENCOUNTER — Ambulatory Visit: Payer: Medicare Other | Admitting: Physical Therapy

## 2021-07-18 DIAGNOSIS — M6281 Muscle weakness (generalized): Secondary | ICD-10-CM

## 2021-07-18 DIAGNOSIS — R2689 Other abnormalities of gait and mobility: Secondary | ICD-10-CM

## 2021-07-18 DIAGNOSIS — R262 Difficulty in walking, not elsewhere classified: Secondary | ICD-10-CM

## 2021-07-18 NOTE — Therapy (Signed)
[facility]. [place], [name], [address] Phone: [phone]   Fax:  [phone]  Physical Therapy Treatment  Patient Details  Name: BASILIO MEADOW MRN: [id number] Date of Birth: May 28, 1929 Referring Provider (PT): Frann Rider   Encounter Date: 07/18/2021   PT End of Session - 07/18/21 1450     Visit Number 15    Date for PT Re-Evaluation 08/22/21    Authorization Type [employer] Medicare    PT Start Time 7322    PT Stop Time 1441    PT Time Calculation (min) 43 min    Activity Tolerance Patient tolerated treatment well    Behavior During Therapy [facility] for tasks assessed/performed             Past Medical History:  Diagnosis Date   Atrial flutter, paroxysmal ([place])    a. dx 04-02-2014, s/p  successful cardioversion 04-06-2014.   Chronic anticoagulation    on Eliquis--  due to recurrent dvt's and pe's   Dyslipidemia    History of bladder cancer urologist-  dr [name]   02-13-2016  s/p TURBT per path high grade papillary urothelial carcinoma   History of DVT (deep vein thrombosis)    2000-- RLE   History of melanoma excision    left flank;  07/ 2014 left lower leg and right upper chest   History of prostate cancer    Gleason 6--  s/p  radical prostatectomy 06/ 2000 in [place]---  no recurrence   History of pulmonary embolus (PE)    2000 & 2005  bilateral   Hx of valvuloplasty 06/19/2007   a. 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 05/2007 CABG x 1: s/p SVG-OM;  b. 10/2010 Ex MV: EF 72%, inf attenuation w/o ischemia, brief run of PAT with exercise. To   Stroke ([place])    Thrombocytopenia ([place])    Thyroid goiter 2013   nodular   Wears hearing aid    BILATERAL    Wears partial dentures    UPPER    Past Surgical History:  Procedure Laterality Date   AORTIC VALVE REPLACEMENT (AVR)/CORONARY ARTERY BYPASS GRAFTING (CABG)  06-19-2007  dr [name]   SVG to OM1 ;   AVR w/ #23 Mitralflow pericardial and ligation left atrial appendage;  MV repair w/ #28 Sorin 3-D memo Ring Annuloplasty with repair ruptured chordar of P-2 flail segments   CARDIAC CATHETERIZATION  05-12-2007  dr Leonia Reeves   single vessel 30% ostial LAD,  80% LCx;  severe to critial AS,  moderate MR,  normal LVF, ef 70%,  normal right heart pressures and cardiac outputs,  mild elevated LV end-diastolic pressure     CARDIOVASCULAR STRESS TEST  11-02-2010  dr Irish Lack   normal nuclear study w/ no ischemia or infarct/scar/  normal LV function and wall motion , ef 72%   CARDIOVERSION N/A 04/06/2014   Procedure: CARDIOVERSION;  Surgeon: [name], MD;  Location: [facility];  Service: Cardiovascular;  Laterality: N/A;   successful   CATARACT EXTRACTION W/ INTRAOCULAR LENS  IMPLANT, BILATERAL  2012  approx   CORONARY ARTERY BYPASS GRAFT     PROSTATECTOMY     RETROPUBIC RADICAL PROSTATECTOMY  06/ 2000  in [place]   RIGHT/LEFT HEART CATH AND CORONARY/GRAFT ANGIOGRAPHY N/A 10/29/2018   Procedure: RIGHT/LEFT HEART CATH AND CORONARY/GRAFT ANGIOGRAPHY;  Surgeon: [name], MD;  Location: [facility];  Service: Cardiovascular;  Laterality: N/A;   ROTATOR CUFF REPAIR Left 09/1998   TEE WITHOUT CARDIOVERSION N/A 04/06/2014   Procedure: TRANSESOPHAGEAL ECHOCARDIOGRAM (TEE);  Surgeon: [name], MD;  Location: [facility] ENDOSCOPY;  Service: Cardiovascular;  Laterality: N/A;  mild focal basa LVH of the septum, ef 50-55%, s/p AV annuloplasty ring, elongated chordae, thickened leaflets, mild to mod. MR, multiple small jets/arotic bioprosthetic valve sits well in position, mild AI/ thickened TV w/ mild reurg./mild LA   TEE WITHOUT CARDIOVERSION N/A 10/22/2018   Procedure: TRANSESOPHAGEAL  ECHOCARDIOGRAM (TEE);  Surgeon: [name], MD;  Location: [facility] ENDOSCOPY;  Service: Cardiovascular;  Laterality: N/A;   TEE WITHOUT CARDIOVERSION N/A 11/25/2018   Procedure: TRANSESOPHAGEAL ECHOCARDIOGRAM (TEE);  Surgeon: [name], MD;  Location: [facility];  Service: Open Heart Surgery;  Laterality: N/A;   TRANSCATHETER AORTIC VALVE REPLACEMENT, TRANSFEMORAL N/A 11/25/2018   Procedure: TRANSCATHETER AORTIC VALVE REPLACEMENT, TRANSFEMORAL;  Surgeon: [name], MD;  Location: [facility];  Service: Open Heart Surgery;  Laterality: N/A;   TRANSTHORACIC ECHOCARDIOGRAM  03-23-2016   dr Irish Lack   EF 55-60%, reduced contribution atrial contraction to ventricular filling, due to increased ventricular diastolic pressure or atrial contratile dysfunction/  bioprosthetic AV w/ mod. regurg. (valve area 1.97cm^2)/ mild dilated ascending aorta/ mild thicken MV normal function annular ring prosthesis/severe LAE & mod. to sev.RAE/ PASP 47mHg/ mild TR/ ventricle septal motion show paradox   TRANSURETHRAL RESECTION OF BLADDER TUMOR N/A 02/13/2016   Procedure: TRANSURETHRAL RESECTION OF BLADDER TUMOR (TURBT);  Surgeon: [name] MD;  [facility]  Service: Urology;  Laterality: N/A;   TRANSURETHRAL RESECTION OF BLADDER TUMOR N/A 09/04/2016   Procedure: TRANSURETHRAL RESECTION OF BLADDER TUMOR (TURBT);  Surgeon: [name] MD;  [facility]  Service: Urology;  Laterality: N/A;    There were no vitals filed for this visit.   Subjective Assessment - 07/18/21 1400     Subjective no falls, no pain    Currently in Pain? No/denies                               [facility] Adult PT Treatment/Exercise - 07/18/21 0001       Transfers   Comments used the mat and helped him down onto the floor and then did some problem solving on getting up and he was able to do easily if he was on hands and knees, much more diffiuclt if he started  from his backside,      Ambulation/Gait   Gait Comments outside around building and then in the grass challenging his balance, stairs step over step holding weighted ball, over pine needles, up a steep slope      High Level Balance   High Level Balance Comments on airex eyes closed, on airex reaching in front, walking ball toss, fwd, bkwd, side to side, had him try to walk across the large mat long ways with 4 steps using momentum and big steps, this worked well after the first two, walking ball toss      Knee/Hip Exercises: Aerobic   Recumbent Bike level 4 x 5 minutes      Knee/Hip Exercises: Standing   Walking with Sports Cord all directions 40$  PT Short Term Goals - 06/06/21 0932       PT SHORT TERM GOAL #1   Title Pt will be I and compliant with initial HEP.    Status Achieved               PT Long Term Goals - 07/18/21 1453       PT LONG TERM GOAL #1   Title Pt will be I and compliant with HEP.    Status Partially Met      PT LONG TERM GOAL #2   Title Pt will improv BERG balance score 47/56 to show improved balance    Status Partially Met                   Plan - 07/18/21 1451     Clinical Impression Statement Patient doing well, able to get up easily from the floor if on hands and knees, if on his backside this is much more diffiuclt for him to get turned over.  He did not have much backward LOB today, the best he has done    PT Next Visit Plan continue to work on the higher level balanc,e ankle strength and getting up from the floor    Consulted and Agree with Plan of Care Patient             Patient will benefit from skilled therapeutic intervention in order to improve the following deficits and impairments:  Abnormal gait, Decreased coordination, Difficulty walking, Cardiopulmonary status limiting activity, Decreased endurance, Decreased activity tolerance, Decreased balance, Postural dysfunction, Decreased  strength, Decreased mobility  Visit Diagnosis: Muscle weakness (generalized)  Difficulty in walking, not elsewhere classified  Other abnormalities of gait and mobility     Problem List Patient Active Problem List   Diagnosis Date Noted   Cerebral thrombosis with cerebral infarction 11/25/2020   Acute right-sided weakness 11/24/2020   History of mitral valve repair 11/26/2018   Prosthetic valve dysfunction 11/25/2018   Ectatic abdominal aorta ([place]) 11/25/2018   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 [place], [name], [address] Phone: [phone]   Fax:  [phone]  Name: DAMIER DISANO MRN: [id number] Date of Birth: Aug 04, 1929

## 2021-07-20 ENCOUNTER — Other Ambulatory Visit: Payer: Self-pay

## 2021-07-20 ENCOUNTER — Ambulatory Visit: Payer: Medicare Other | Attending: Internal Medicine | Admitting: Physical Therapy

## 2021-07-20 ENCOUNTER — Encounter: Payer: Self-pay | Admitting: Physical Therapy

## 2021-07-20 DIAGNOSIS — R278 Other lack of coordination: Secondary | ICD-10-CM | POA: Insufficient documentation

## 2021-07-20 DIAGNOSIS — M6281 Muscle weakness (generalized): Secondary | ICD-10-CM | POA: Diagnosis present

## 2021-07-20 DIAGNOSIS — R262 Difficulty in walking, not elsewhere classified: Secondary | ICD-10-CM | POA: Insufficient documentation

## 2021-07-20 DIAGNOSIS — R2689 Other abnormalities of gait and mobility: Secondary | ICD-10-CM | POA: Diagnosis present

## 2021-07-20 NOTE — Therapy (Signed)
[facility]. [place], [name], [address] Phone: [phone]   Fax:  [phone]  Physical Therapy Treatment  Patient Details  Name: Eric [name] MRN: [id number] Date of Birth: 12/18/1929 Referring Provider (PT): Frann Rider   Encounter Date: 07/20/2021   PT End of Session - 07/20/21 1401     Visit Number 16    Date for PT Re-Evaluation 08/22/21    Authorization Type UHC Medicare    PT Start Time 1312    PT Stop Time 1657    PT Time Calculation (min) 45 min    Activity Tolerance Patient tolerated treatment well    Behavior During Therapy [facility] for tasks assessed/performed             Past Medical History:  Diagnosis Date   Atrial flutter, paroxysmal ([place])    a. dx 04-02-2014, s/p  successful cardioversion 04-06-2014.   Chronic anticoagulation    on Eliquis--  due to recurrent dvt's and pe's   Dyslipidemia    History of bladder cancer urologist-  dr [name]   02-13-2016  s/p TURBT per path high grade papillary urothelial carcinoma   History of DVT (deep vein thrombosis)    2000-- RLE   History of melanoma excision    left flank;  07/ 2014 left lower leg and right upper chest   History of prostate cancer    Gleason 6--  s/p  radical prostatectomy 06/ 2000 in [place]---  no recurrence   History of pulmonary embolus (PE)    2000 & 2005  bilateral   Hx of valvuloplasty 06/19/2007   a. 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 05/2007 CABG x 1: s/p SVG-OM;  b. 10/2010 Ex MV: EF 72%, inf attenuation w/o ischemia, brief run of PAT with exercise. To   Stroke ([place])    Thrombocytopenia ([place])    Thyroid goiter 2013   nodular   Wears hearing aid    BILATERAL    Wears partial dentures    UPPER    Past Surgical History:  Procedure Laterality Date   AORTIC VALVE REPLACEMENT (AVR)/CORONARY ARTERY BYPASS GRAFTING (CABG)  06-19-2007  dr [name]   SVG to OM1 ;   AVR w/ #23 Mitralflow pericardial and ligation left atrial appendage;  MV repair w/ #28 Sorin 3-D memo Ring Annuloplasty with repair ruptured chordar of P-2 flail segments   CARDIAC CATHETERIZATION  05-12-2007  dr Leonia Reeves   single vessel 30% ostial LAD,  80% LCx;  severe to critial AS,  moderate MR,  normal LVF, ef 70%,  normal right heart pressures and cardiac outputs,  mild elevated LV end-diastolic pressure     CARDIOVASCULAR STRESS TEST  11-02-2010  dr Irish Lack   normal nuclear study w/ no ischemia or infarct/scar/  normal LV function and wall motion , ef 72%   CARDIOVERSION N/A 04/06/2014   Procedure: CARDIOVERSION;  Surgeon: [name], MD;  Location: [facility];  Service: Cardiovascular;  Laterality: N/A;   successful   CATARACT EXTRACTION W/ INTRAOCULAR LENS  IMPLANT, BILATERAL  2012  approx   CORONARY ARTERY BYPASS GRAFT     PROSTATECTOMY     RETROPUBIC RADICAL PROSTATECTOMY  06/ 2000  in [place], [place]   RIGHT/LEFT HEART CATH AND CORONARY/GRAFT ANGIOGRAPHY N/A 10/29/2018   Procedure: RIGHT/LEFT HEART CATH AND CORONARY/GRAFT ANGIOGRAPHY;  Surgeon: [name], MD;  Location: [facility];  Service: Cardiovascular;  Laterality: N/A;   ROTATOR CUFF REPAIR Left 09/1998   TEE WITHOUT CARDIOVERSION N/A 04/06/2014   Procedure: TRANSESOPHAGEAL ECHOCARDIOGRAM (TEE);  Surgeon: [name], MD;  Location: [facility] ENDOSCOPY;  Service: Cardiovascular;  Laterality: N/A;  mild focal basa LVH of the septum, ef 50-55%, s/p AV annuloplasty ring, elongated chordae, thickened leaflets, mild to mod. MR, multiple small jets/arotic bioprosthetic valve sits well in position, mild AI/ thickened TV w/ mild reurg./mild LA   TEE WITHOUT CARDIOVERSION N/A 10/22/2018   Procedure: TRANSESOPHAGEAL  ECHOCARDIOGRAM (TEE);  Surgeon: [name], MD;  [facility];  Service: Cardiovascular;  Laterality: N/A;   TEE WITHOUT CARDIOVERSION N/A 11/25/2018   Procedure: TRANSESOPHAGEAL ECHOCARDIOGRAM (TEE);  Surgeon: [name], MD;  Location: [facility];  Service: Open Heart Surgery;  Laterality: N/A;   TRANSCATHETER AORTIC VALVE REPLACEMENT, TRANSFEMORAL N/A 11/25/2018   Procedure: TRANSCATHETER AORTIC VALVE REPLACEMENT, TRANSFEMORAL;  Surgeon: [name], MD;  Location: [place] [facility];  Service: Open Heart Surgery;  Laterality: N/A;   TRANSTHORACIC ECHOCARDIOGRAM  03-23-2016   dr Irish Lack   EF 55-60%, reduced contribution atrial contraction to ventricular filling, due to increased ventricular diastolic pressure or atrial contratile dysfunction/  bioprosthetic AV w/ mod. regurg. (valve area 1.97cm^2)/ mild dilated ascending aorta/ mild thicken MV normal function annular ring prosthesis/severe LAE & mod. to sev.RAE/ PASP 38mHg/ mild TR/ ventricle septal motion show paradox   TRANSURETHRAL RESECTION OF BLADDER TUMOR N/A 02/13/2016   Procedure: TRANSURETHRAL RESECTION OF BLADDER TUMOR (TURBT);  Surgeon: [name] MD;  [facility]  Service: Urology;  Laterality: N/A;   TRANSURETHRAL RESECTION OF BLADDER TUMOR N/A 09/04/2016   Procedure: TRANSURETHRAL RESECTION OF BLADDER TUMOR (TURBT);  Surgeon: [name] MD;  [facility]  Service: Urology;  Laterality: N/A;    There were no vitals filed for this visit.   Subjective Assessment - 07/20/21 1319     Subjective I was tired after the last visit, no falls    Currently in Pain? No/denies                               [facility] - [facility]       High Level Balance   High Level Balance Comments airex cone toe touches,, worked on hip strategies for balance with back to wall      Knee/Hip Exercises: Aerobic   Nustep L6 x 6 min       Knee/Hip Exercises: Machines for Strengthening   Cybex Knee Extension 10lb 3x10    Cybex Knee Flexion 35lb 3x10    Other Machine straight arm pulls 10# 2x10, then resisted gait 40# all directions, seated row 20#, lats 20#, chest press 10# all 2 x10                       PT Short Term Goals - 06/06/21 0932       PT SHORT TERM GOAL #1   Title Pt will be I and compliant with initial HEP.    Status Achieved               PT Long Term Goals - 07/20/21 18099  PT LONG TERM GOAL #2   Title Pt will improv BERG balance score 47/56 to show improved balance    Status Partially Met      PT LONG TERM GOAL #3   Title decrease TUG time to 12 seconds    Status Partially Met                   Plan - 07/20/21 1402     Clinical Impression Statement Patient still losing balance to the back, has a good step reaction but no hip strategy, focused some on this today.  Added an extra lap with the farmer's carry    PT Next Visit Plan continue to work on the higher level balanc,e ankle strength and getting up from the floor    Consulted and Agree with Plan of Care Patient             Patient will benefit from skilled therapeutic intervention in order to improve the following deficits and impairments:  Abnormal gait, Decreased coordination, Difficulty walking, Cardiopulmonary status limiting activity, Decreased endurance, Decreased activity tolerance, Decreased balance, Postural dysfunction, Decreased strength, Decreased mobility  Visit Diagnosis: Muscle weakness (generalized)  Difficulty in walking, not elsewhere classified  Other abnormalities of gait and mobility  Other lack of coordination     Problem List Patient Active Problem List   Diagnosis Date Noted   Cerebral thrombosis with cerebral infarction 11/25/2020   Acute right-sided weakness 11/24/2020   History of mitral valve repair 11/26/2018   Prosthetic valve dysfunction 11/25/2018    Ectatic abdominal aorta ([place]) 11/25/2018   S/P valve-in-valve TAVR 11/25/2018   History of bladder cancer    History of pulmonary embolus (PE)    Acute on chronic diastolic heart failure ([place]) 04/23/2018   History of aortic valve replacement 04/23/2018   Atrial fibrillation ([place]) 04/23/2018   Coronary atherosclerosis of native coronary artery 02/25/2014   Hyperlipidemia 02/25/2014   Embolism and thrombosis ([place]) 03/11/2013    Sumner Boast, PT 07/20/2021, 2:03 PM  [place]. [place], [name], [address] Phone: [phone]   Fax:  [phone]  Name: GROVE DEFINA MRN: [id number] Date of Birth: 03/02/1930

## 2021-07-25 ENCOUNTER — Other Ambulatory Visit: Payer: Self-pay

## 2021-07-25 ENCOUNTER — Ambulatory Visit: Payer: Medicare Other | Admitting: Physical Therapy

## 2021-07-25 ENCOUNTER — Encounter: Payer: Self-pay | Admitting: Physical Therapy

## 2021-07-25 DIAGNOSIS — M6281 Muscle weakness (generalized): Secondary | ICD-10-CM

## 2021-07-25 DIAGNOSIS — R2689 Other abnormalities of gait and mobility: Secondary | ICD-10-CM

## 2021-07-25 DIAGNOSIS — R262 Difficulty in walking, not elsewhere classified: Secondary | ICD-10-CM

## 2021-07-25 DIAGNOSIS — R278 Other lack of coordination: Secondary | ICD-10-CM

## 2021-07-25 NOTE — Therapy (Signed)
[facility] [facility]. [place], [name], [address] Phone: [phone]   Fax:  [phone]  Physical Therapy Treatment  Patient Details  Name: Eric [name] MRN: [id number] Date of Birth: 04/24/1930 Referring Provider (PT): Frann Rider   Encounter Date: 07/25/2021   PT End of Session - 07/25/21 0934     Visit Number 17    Date for PT Re-Evaluation 08/22/21    Authorization Type [employer] Medicare    PT Start Time 1657    PT Stop Time 0931    PT Time Calculation (min) 47 min    Activity Tolerance Patient tolerated treatment well    Behavior During Therapy [facility] for tasks assessed/performed             Past Medical History:  Diagnosis Date   Atrial flutter, paroxysmal ([place])    a. dx 04-02-2014, s/p  successful cardioversion 04-06-2014.   Chronic anticoagulation    on Eliquis--  due to recurrent dvt's and pe's   Dyslipidemia    History of bladder cancer urologist-  dr [name]   02-13-2016  s/p TURBT per path high grade papillary urothelial carcinoma   History of DVT (deep vein thrombosis)    2000-- RLE   History of melanoma excision    left flank;  07/ 2014 left lower leg and right upper chest   History of prostate cancer    Gleason 6--  s/p  radical prostatectomy 06/ 2000 in [place]---  no recurrence   History of pulmonary embolus (PE)    2000 & 2005  bilateral   Hx of valvuloplasty 06/19/2007   a. 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 05/2007 CABG x 1: s/p SVG-OM;  b. 10/2010 Ex MV: EF 72%, inf attenuation w/o ischemia, brief run of PAT with exercise. To   Stroke ([place])    Thrombocytopenia ([place])    Thyroid goiter 2013   nodular   Wears hearing aid    BILATERAL    Wears partial dentures    UPPER    Past Surgical History:  Procedure Laterality Date   AORTIC VALVE REPLACEMENT (AVR)/CORONARY ARTERY BYPASS GRAFTING (CABG)  06-19-2007  dr [name]   SVG to OM1 ;   AVR w/ #23 Mitralflow pericardial and ligation left atrial appendage;  MV repair w/ #28 Sorin 3-D memo Ring Annuloplasty with repair ruptured chordar of P-2 flail segments   CARDIAC CATHETERIZATION  05-12-2007  dr Leonia Reeves   single vessel 30% ostial LAD,  80% LCx;  severe to critial AS,  moderate MR,  normal LVF, ef 70%,  normal right heart pressures and cardiac outputs,  mild elevated LV end-diastolic pressure     CARDIOVASCULAR STRESS TEST  11-02-2010  dr Irish Lack   normal nuclear study w/ no ischemia or infarct/scar/  normal LV function and wall motion , ef 72%   CARDIOVERSION N/A 04/06/2014   Procedure: CARDIOVERSION;  Surgeon: [name], MD;  Location: [place];  Service: Cardiovascular;  Laterality: N/A;   successful   CATARACT EXTRACTION W/ INTRAOCULAR LENS  IMPLANT, BILATERAL  2012  approx   CORONARY ARTERY BYPASS GRAFT     PROSTATECTOMY     RETROPUBIC RADICAL PROSTATECTOMY  06/ 2000  in [place]   RIGHT/LEFT HEART CATH AND CORONARY/GRAFT ANGIOGRAPHY N/A 10/29/2018   Procedure: RIGHT/LEFT HEART CATH AND CORONARY/GRAFT ANGIOGRAPHY;  Surgeon: [name], MD;  Location: [place] CV LAB;  Service: Cardiovascular;  Laterality: N/A;   ROTATOR CUFF REPAIR Left 09/1998   TEE WITHOUT CARDIOVERSION N/A 04/06/2014   Procedure: TRANSESOPHAGEAL ECHOCARDIOGRAM (TEE);  Surgeon: [name], MD;  [facility] ENDOSCOPY;  Service: Cardiovascular;  Laterality: N/A;  mild focal basa LVH of the septum, ef 50-55%, s/p AV annuloplasty ring, elongated chordae, thickened leaflets, mild to mod. MR, multiple small jets/arotic bioprosthetic valve sits well in position, mild AI/ thickened TV w/ mild reurg./mild LA   TEE WITHOUT CARDIOVERSION N/A 10/22/2018   Procedure: TRANSESOPHAGEAL  ECHOCARDIOGRAM (TEE);  Surgeon: [name], MD;  Location: [facility] ENDOSCOPY;  Service: Cardiovascular;  Laterality: N/A;   TEE WITHOUT CARDIOVERSION N/A 11/25/2018   Procedure: TRANSESOPHAGEAL ECHOCARDIOGRAM (TEE);  Surgeon: [name], MD;  Location: [place];  Service: Open Heart Surgery;  Laterality: N/A;   TRANSCATHETER AORTIC VALVE REPLACEMENT, TRANSFEMORAL N/A 11/25/2018   Procedure: TRANSCATHETER AORTIC VALVE REPLACEMENT, TRANSFEMORAL;  Surgeon: [name], MD;  Location: [place];  Service: Open Heart Surgery;  Laterality: N/A;   TRANSTHORACIC ECHOCARDIOGRAM  03-23-2016   dr Irish Lack   EF 55-60%, reduced contribution atrial contraction to ventricular filling, due to increased ventricular diastolic pressure or atrial contratile dysfunction/  bioprosthetic AV w/ mod. regurg. (valve area 1.97cm^2)/ mild dilated ascending aorta/ mild thicken MV normal function annular ring prosthesis/severe LAE & mod. to sev.RAE/ PASP 59mHg/ mild TR/ ventricle septal motion show paradox   TRANSURETHRAL RESECTION OF BLADDER TUMOR N/A 02/13/2016   Procedure: TRANSURETHRAL RESECTION OF BLADDER TUMOR (TURBT);  Surgeon: [name] MD;  [facility]  Service: Urology;  Laterality: N/A;   TRANSURETHRAL RESECTION OF BLADDER TUMOR N/A 09/04/2016   Procedure: TRANSURETHRAL RESECTION OF BLADDER TUMOR (TURBT);  Surgeon: [name] MD;  [facility]  Service: Urology;  Laterality: N/A;    There were no vitals filed for this visit.   Subjective Assessment - 07/25/21 0850     Subjective No falls, I do think I get really tired after our sessions.    Currently in Pain? No/denies                               [facility] Adult PT Treatment/Exercise - 07/25/21 0001       Ambulation/Gait   Gait Comments gait outside 2 laps around the back building and finished up walking on uneven surfaces      High Level Balance   High Level Balance  Comments on airex back to wall working on him touching botton and back to wall and then coming off the wall, asking him to lead wiht hips, this was difficult for him, on airex ball toss and then with eyes closed.      Knee/Hip Exercises: Aerobic   Nustep L6 x 6 min      Knee/Hip Exercises: Machines for Strengthening   Cybex Knee Extension 10lb 3x10    Cybex Knee Flexion 35lb 3x10                       PT Short Term Goals - 06/06/21 0932       PT SHORT TERM GOAL #1   Title Pt will be I and compliant with initial HEP.  Status Achieved               PT Long Term Goals - 07/20/21 1403       PT LONG TERM GOAL #2   Title Pt will improv BERG balance score 47/56 to show improved balance    Status Partially Met      PT LONG TERM GOAL #3   Title decrease TUG time to 12 seconds    Status Partially Met                   Plan - 07/25/21 0935     Clinical Impression Statement Added distance to the walk outside and then finished with uneven terrain walking, he did well, just a little fatigued.  Worked on the hip strategy again, he still has difficulty moving hips forward to help loss of balance    PT Next Visit Plan work [employer] he balance, he will travel to [place] next week    Consulted and Agree with Plan of Care Patient             Patient will benefit from skilled therapeutic intervention in order to improve the following deficits and impairments:  Abnormal gait, Decreased coordination, Difficulty walking, Cardiopulmonary status limiting activity, Decreased endurance, Decreased activity tolerance, Decreased balance, Postural dysfunction, Decreased strength, Decreased mobility  Visit Diagnosis: Muscle weakness (generalized)  Difficulty in walking, not elsewhere classified  Other abnormalities of gait and mobility  Other lack of coordination     Problem List Patient Active Problem List   Diagnosis Date Noted   Cerebral thrombosis with  cerebral infarction 11/25/2020   Acute right-sided weakness 11/24/2020   History of mitral valve repair 11/26/2018   Prosthetic valve dysfunction 11/25/2018   Ectatic abdominal aorta ([place]) 11/25/2018   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 [place], [name], [address] Phone: [phone]   Fax:  [phone]  Name: Eric [name] MRN: [id number] Date of Birth: 07-23-1929

## 2021-07-26 LAB — COMPREHENSIVE METABOLIC PANEL WITH GFR: EGFR: 73

## 2021-07-27 ENCOUNTER — Other Ambulatory Visit: Payer: Self-pay

## 2021-07-27 ENCOUNTER — Encounter: Payer: Self-pay | Admitting: Physical Therapy

## 2021-07-27 ENCOUNTER — Ambulatory Visit: Payer: Medicare Other | Admitting: Physical Therapy

## 2021-07-27 DIAGNOSIS — R2689 Other abnormalities of gait and mobility: Secondary | ICD-10-CM

## 2021-07-27 DIAGNOSIS — M6281 Muscle weakness (generalized): Secondary | ICD-10-CM

## 2021-07-27 DIAGNOSIS — R262 Difficulty in walking, not elsewhere classified: Secondary | ICD-10-CM

## 2021-07-27 NOTE — Therapy (Signed)
[facility]. [place], [name], [address] Phone: [phone]   Fax:  [phone]  Physical Therapy Treatment  Patient Details  Name: Eric [name] MRN: [id number] Date of Birth: 1930-02-26 Referring Provider (PT): Frann Rider   Encounter Date: 07/27/2021   PT End of Session - 07/27/21 0931     Visit Number 18    Date for PT Re-Evaluation 08/22/21    Authorization Type UHC Medicare    PT Start Time 1886    PT Stop Time 0930    PT Time Calculation (min) 46 min    Activity Tolerance Patient tolerated treatment well    Behavior During Therapy [facility] for tasks assessed/performed             Past Medical History:  Diagnosis Date   Atrial flutter, paroxysmal ([place])    a. dx 04-02-2014, s/p  successful cardioversion 04-06-2014.   Chronic anticoagulation    on Eliquis--  due to recurrent dvt's and pe's   Dyslipidemia    History of bladder cancer urologist-  dr [name]   02-13-2016  s/p TURBT per path high grade papillary urothelial carcinoma   History of DVT (deep vein thrombosis)    2000-- RLE   History of melanoma excision    left flank;  07/ 2014 left lower leg and right upper chest   History of prostate cancer    Gleason 6--  s/p  radical prostatectomy 06/ 2000 in [place]---  no recurrence   History of pulmonary embolus (PE)    2000 & 2005  bilateral   Hx of valvuloplasty 06/19/2007   a. 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 05/2007 CABG x 1: s/p SVG-OM;  b. 10/2010 Ex MV: EF 72%, inf attenuation w/o ischemia, brief run of PAT with exercise. To   Stroke ([place])    Thrombocytopenia ([place])    Thyroid goiter 2013   nodular   Wears hearing aid    BILATERAL    Wears partial dentures    UPPER    Past Surgical History:  Procedure Laterality Date   AORTIC VALVE REPLACEMENT (AVR)/CORONARY ARTERY BYPASS GRAFTING (CABG)  06-19-2007  dr [name]   SVG to OM1 ;   AVR w/ #23 Mitralflow pericardial and ligation left atrial appendage;  MV repair w/ #28 Sorin 3-D memo Ring Annuloplasty with repair ruptured chordar of P-2 flail segments   CARDIAC CATHETERIZATION  05-12-2007  dr Leonia Reeves   single vessel 30% ostial LAD,  80% LCx;  severe to critial AS,  moderate MR,  normal LVF, ef 70%,  normal right heart pressures and cardiac outputs,  mild elevated LV end-diastolic pressure     CARDIOVASCULAR STRESS TEST  11-02-2010  dr Irish Lack   normal nuclear study w/ no ischemia or infarct/scar/  normal LV function and wall motion , ef 72%   CARDIOVERSION N/A 04/06/2014   Procedure: CARDIOVERSION;  Surgeon: [name], MD;  Location: [facility];  Service: Cardiovascular;  Laterality: N/A;   successful   CATARACT EXTRACTION W/ INTRAOCULAR LENS  IMPLANT, BILATERAL  2012  approx   CORONARY ARTERY BYPASS GRAFT     PROSTATECTOMY     RETROPUBIC RADICAL PROSTATECTOMY  06/ 2000  in [place], [place]   RIGHT/LEFT HEART CATH AND CORONARY/GRAFT ANGIOGRAPHY N/A 10/29/2018   Procedure: RIGHT/LEFT HEART CATH AND CORONARY/GRAFT ANGIOGRAPHY;  Surgeon: [name], MD;  Location: [place] CV LAB;  Service: Cardiovascular;  Laterality: N/A;   ROTATOR CUFF REPAIR Left 09/1998   TEE WITHOUT CARDIOVERSION N/A 04/06/2014   Procedure: TRANSESOPHAGEAL ECHOCARDIOGRAM (TEE);  Surgeon: [name], MD;  Location: [facility] ENDOSCOPY;  Service: Cardiovascular;  Laterality: N/A;  mild focal basa LVH of the septum, ef 50-55%, s/p AV annuloplasty ring, elongated chordae, thickened leaflets, mild to mod. MR, multiple small jets/arotic bioprosthetic valve sits well in position, mild AI/ thickened TV w/ mild reurg./mild LA   TEE WITHOUT CARDIOVERSION N/A 10/22/2018   Procedure: TRANSESOPHAGEAL  ECHOCARDIOGRAM (TEE);  Surgeon: [name], MD;  Location: [facility] ENDOSCOPY;  Service: Cardiovascular;  Laterality: N/A;   TEE WITHOUT CARDIOVERSION N/A 11/25/2018   Procedure: TRANSESOPHAGEAL ECHOCARDIOGRAM (TEE);  Surgeon: [name], MD;  Location: [place];  Service: Open Heart Surgery;  Laterality: N/A;   TRANSCATHETER AORTIC VALVE REPLACEMENT, TRANSFEMORAL N/A 11/25/2018   Procedure: TRANSCATHETER AORTIC VALVE REPLACEMENT, TRANSFEMORAL;  Surgeon: [name], MD;  Location: [place];  Service: Open Heart Surgery;  Laterality: N/A;   TRANSTHORACIC ECHOCARDIOGRAM  03-23-2016   dr Irish Lack   EF 55-60%, reduced contribution atrial contraction to ventricular filling, due to increased ventricular diastolic pressure or atrial contratile dysfunction/  bioprosthetic AV w/ mod. regurg. (valve area 1.97cm^2)/ mild dilated ascending aorta/ mild thicken MV normal function annular ring prosthesis/severe LAE & mod. to sev.RAE/ PASP 46mHg/ mild TR/ ventricle septal motion show paradox   TRANSURETHRAL RESECTION OF BLADDER TUMOR N/A 02/13/2016   Procedure: TRANSURETHRAL RESECTION OF BLADDER TUMOR (TURBT);  Surgeon: [name] MD;  [facility]  Service: Urology;  Laterality: N/A;   TRANSURETHRAL RESECTION OF BLADDER TUMOR N/A 09/04/2016   Procedure: TRANSURETHRAL RESECTION OF BLADDER TUMOR (TURBT);  Surgeon: [name] MD;  [facility]  Service: Urology;  Laterality: N/A;    There were no vitals filed for this visit.   Subjective Assessment - 07/27/21 0848     Subjective Doing pretty good, had a good visit with the MD    Currently in Pain? No/denies                               [facility] - [facility]       High Level Balance   High Level Balance Comments on airex back to wall working on him touching botton and back to wall and then coming off the wall, asking him to lead wiht hips,  this was difficult for him, on airex ball toss and then with eyes closed., bosu in [place], rocker board in [place] back to wall on airex      Knee/Hip Exercises: Aerobic   Nustep L6 x 6 min      Knee/Hip Exercises: Machines for Strengthening   Cybex Knee Extension 10lb 3x10    Cybex Knee Flexion 35lb 3x10    Other Machine 25# rows, lats                       PT Short Term Goals - 06/06/21 0932       PT SHORT TERM GOAL #1   Title Pt will be I and compliant with initial HEP.    Status Achieved  PT Long Term Goals - 07/27/21 0933       PT LONG TERM GOAL #3   Title decrease TUG time to 12 seconds    Status Partially Met      PT LONG TERM GOAL #4   Title increase knee and ankle strength to 4+/5    Status Partially Met      PT LONG TERM GOAL #5   Title right ankle DF to 5 degrees actively    Status Partially Met                   Plan - 07/27/21 0931     Clinical Impression Statement Continue to work on the strategies of balance and focus on hips, added bosu and rocker baord to balance today.  He did well with this, still struggling some with the hip strategies    PT Next Visit Plan work [employer] he balance, [place]    Consulted and Agree with Plan of Care Patient             Patient will benefit from skilled therapeutic intervention in order to improve the following deficits and impairments:  Abnormal gait, Decreased coordination, Difficulty walking, Cardiopulmonary status limiting activity, Decreased endurance, Decreased activity tolerance, Decreased balance, Postural dysfunction, Decreased strength, Decreased mobility  Visit Diagnosis: Muscle weakness (generalized)  Difficulty in walking, not elsewhere classified  Other abnormalities of gait and mobility     Problem List Patient Active Problem List   Diagnosis Date Noted   Cerebral thrombosis with cerebral infarction 11/25/2020   Acute  right-sided weakness 11/24/2020   History of mitral valve repair 11/26/2018   Prosthetic valve dysfunction 11/25/2018   Ectatic abdominal aorta ([place]) 11/25/2018   S/P valve-in-valve TAVR 11/25/2018   History of bladder cancer    History of pulmonary embolus (PE)    Acute on chronic diastolic heart failure ([place]) 04/23/2018   History of aortic valve replacement 04/23/2018   Atrial fibrillation ([place]) 04/23/2018   Coronary atherosclerosis of native coronary artery 02/25/2014   Hyperlipidemia 02/25/2014   Embolism and thrombosis ([place]) 03/11/2013    Sumner Boast, PT 07/27/2021, 9:34 AM  [place]. [place], [name], [address] Phone: [phone]   Fax:  [phone]  Name: Eric [name] MRN: [id number] Date of Birth: 1929-06-10

## 2021-08-01 ENCOUNTER — Ambulatory Visit: Payer: Medicare Other | Admitting: Physical Therapy

## 2021-08-03 ENCOUNTER — Ambulatory Visit: Payer: Medicare Other | Admitting: Physical Therapy

## 2021-08-08 ENCOUNTER — Encounter: Payer: Self-pay | Admitting: Physical Therapy

## 2021-08-08 ENCOUNTER — Other Ambulatory Visit: Payer: Self-pay

## 2021-08-08 ENCOUNTER — Ambulatory Visit: Payer: Medicare Other | Admitting: Physical Therapy

## 2021-08-08 DIAGNOSIS — R278 Other lack of coordination: Secondary | ICD-10-CM

## 2021-08-08 DIAGNOSIS — R262 Difficulty in walking, not elsewhere classified: Secondary | ICD-10-CM

## 2021-08-08 DIAGNOSIS — M6281 Muscle weakness (generalized): Secondary | ICD-10-CM | POA: Diagnosis not present

## 2021-08-08 DIAGNOSIS — R2689 Other abnormalities of gait and mobility: Secondary | ICD-10-CM

## 2021-08-08 NOTE — Therapy (Signed)
[facility] ?[address] ?[place], [name], [address] ?Phone: [phone]   Fax:  [phone] ? ?Physical Therapy Treatment ? ?Patient Details  ?Name: Eric [name] ?MRN: [id number] ?Date of Birth: 10/18/1929 ?Referring Provider (PT): Frann Rider ? ? ?Encounter Date: 08/08/2021 ? ? PT End of Session - 08/08/21 0927   ? ? Visit Number 19   ? Date for PT Re-Evaluation 08/22/21   ? Authorization Type U[employer] Medicare   ? PT Start Time [phone]   ? PT Stop Time 5436   ? PT Time Calculation (min) 44 min   ? Activity Tolerance Patient tolerated treatment well   ? Behavior During Therapy [facility] for tasks assessed/performed   ? ?  ?  ? ?  ? ? ?Past Medical History:  ?Diagnosis Date  ? Atrial flutter, paroxysmal ([place])   ? a. dx 04-02-2014, s/p  successful cardioversion 04-06-2014.  ? Chronic anticoagulation   ? on Eliquis--  due to recurrent dvt's and pe's  ? Dyslipidemia   ? History of bladder cancer urologist-  dr [name]  ? 02-13-2016  s/p TURBT per path high grade papillary urothelial carcinoma  ? History of DVT (deep vein thrombosis)   ? 2000-- RLE  ? History of melanoma excision   ? left flank;  07/ 2014 left lower leg and right upper chest  ? History of prostate cancer   ? Gleason 6--  s/p  radical prostatectomy 06/ 2000 in [place]---  no recurrence  ? History of pulmonary embolus (PE)   ? 2000 & 2005  bilateral  ? Hx of valvuloplasty 06/19/2007  ? a. s/p mitral ring annuloplasty, repair ruptured chordae of P2 flail segments of MV  ? S/P aortic valve replacement with bioprosthetic valve   ? 06-19-2007  severe aortic valve stenosis  ? 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 Single vessel coronary artery disease   cardiologist-  dr Irish Lack  ? a. 05/2007 CABG x 1: s/p SVG-OM;  b. 10/2010 Ex MV: EF 72%, inf attenuation w/o ischemia, brief run of PAT with exercise. To  ? Stroke [facility])   ? Thrombocytopenia ([place])   ? Thyroid goiter 2013  ? nodular  ? Wears hearing aid   ? BILATERAL   ? Wears partial dentures   ? UPPER  ? ? ?Past Surgical History:  ?Procedure Laterality Date  ? AORTIC VALVE REPLACEMENT (AVR)/CORONARY ARTERY BYPASS GRAFTING (CABG)  06-19-2007  dr [name]  ? SVG to OM1 ;   AVR w/ #23 Mitralflow pericardial and ligation left atrial appendage;  MV repair w/ #28 Sorin 3-D memo Ring Annuloplasty with repair ruptured chordar of P-2 flail segments  ? CARDIAC CATHETERIZATION  05-12-2007  dr Leonia Reeves  ? single vessel 30% ostial LAD,  80% LCx;  severe to critial AS,  moderate MR,  normal LVF, ef 70%,  normal right heart pressures and cardiac outputs,  mild elevated LV end-diastolic pressure    ? CARDIOVASCULAR STRESS TEST  11-02-2010  dr Irish Lack  ? normal nuclear study w/ no ischemia or infarct/scar/  normal LV function and wall motion , ef 72%  ? CARDIOVERSION N/A 04/06/2014  ? Procedure: CARDIOVERSION;  Surgeon: [name], MD;  Location: [place];  Service: Cardiovascular;  Laterality: N/A;   successful  ? CATARACT EXTRACTION W/ INTRAOCULAR LENS  IMPLANT, BILATERAL  2012  approx  ? CORONARY ARTERY BYPASS GRAFT    ? PROSTATECTOMY    ? RETROPUBIC RADICAL PROSTATECTOMY  06/ 2000  in [place]  ? RIGHT/LEFT HEART CATH AND CORONARY/GRAFT ANGIOGRAPHY N/A 10/29/2018  ? Procedure: RIGHT/LEFT HEART CATH AND CORONARY/GRAFT ANGIOGRAPHY;  Surgeon: [name], MD;  Location: [place] CV LAB;  Service: Cardiovascular;  Laterality: N/A;  ? ROTATOR CUFF REPAIR Left 09/1998  ? TEE WITHOUT CARDIOVERSION N/A 04/06/2014  ? Procedure: TRANSESOPHAGEAL ECHOCARDIOGRAM (TEE);  Surgeon: [name], MD;  [facility] ENDOSCOPY;  Service: Cardiovascular;  Laterality: N/A;  mild focal basa LVH of the septum, ef 50-55%, s/p AV annuloplasty ring, elongated chordae, thickened leaflets, mild to mod. MR, multiple small jets/arotic bioprosthetic valve sits well in position, mild AI/ thickened TV w/ mild reurg./mild LA  ? TEE WITHOUT CARDIOVERSION N/A 10/22/2018  ? Procedure: TRANSESOPHAGEAL  ECHOCARDIOGRAM (TEE);  Surgeon: [name], MD;  Location: [facility] ENDOSCOPY;  Service: Cardiovascular;  Laterality: N/A;  ? TEE WITHOUT CARDIOVERSION N/A 11/25/2018  ? Procedure: TRANSESOPHAGEAL ECHOCARDIOGRAM (TEE);  Surgeon: [name], MD;  Location: [place];  Service: Open Heart Surgery;  Laterality: N/A;  ? TRANSCATHETER AORTIC VALVE REPLACEMENT, TRANSFEMORAL N/A 11/25/2018  ? Procedure: TRANSCATHETER AORTIC VALVE REPLACEMENT, TRANSFEMORAL;  Surgeon: [name], MD;  Location: [place];  Service: Open Heart Surgery;  Laterality: N/A;  ? TRANSTHORACIC ECHOCARDIOGRAM  03-23-2016   dr Irish Lack  ? EF 55-60%, reduced contribution atrial contraction to ventricular filling, due to increased ventricular diastolic pressure or atrial contratile dysfunction/  bioprosthetic AV w/ mod. regurg. (valve area 1.97cm^2)/ mild dilated ascending aorta/ mild thicken MV normal function annular ring prosthesis/severe LAE & mod. to sev.RAE/ PASP 31mHg/ mild TR/ ventricle septal motion show paradox  ? TRANSURETHRAL RESECTION OF BLADDER TUMOR N/A 02/13/2016  ? Procedure: TRANSURETHRAL RESECTION OF BLADDER TUMOR (TURBT);  Surgeon: [name] MD;  [facility]  Service: Urology;  Laterality: N/A;  ? TRANSURETHRAL RESECTION OF BLADDER TUMOR N/A 09/04/2016  ? Procedure: TRANSURETHRAL RESECTION OF BLADDER TUMOR (TURBT);  Surgeon: [name] MD;  [facility]  Service: Urology;  Laterality: N/A;  ? ? ?There were no vitals filed for this visit. ? ? Subjective Assessment - 08/08/21 0853   ? ? Subjective patient was out of town last week, reports no issues with th travel, but did not do much exercise or walking   ? Currently in Pain? No/denies   ? ?  ?  ? ?  ? ? ? ? ? ? ? ? ? ? ? ? ? ? ? ? ? ? ? ? O[place]ia[place]ult PT Treatment/Exercise - 08/08/21 0001   ? ?  ? Ambulation/Gait  ? Gait Comments gait 2 laps around the small parking island   ?  ? High Level Balance  ? High Level  Balance Comments on airex back to wall working on him touching botton and back to wall and then coming off the wall, asking him to lead wiht hips, this was difficult for him, on airex ball toss and then with eyes closed., bosu in [place], rocker board in [place], back to wall on airex   ?  ? Knee/Hip Exercises: Aerobic  ? Recumbent Bike level 4 x 5 minutes   ?  ? Knee/Hip Exercises: Machines for Strengthening  ? Cybex Knee Extension 10lb 3x10   ? Cybex Knee Flexion 35lb 3x10   ? ?  ?  ? ?  ? ? ? ? ? ? ? ? ? ? ? ? PT Short Term Goals - 06/06/21 0932   ? ?  ? PT SHORT TERM  GOAL #1  ? Title Pt will be I and compliant with initial HEP.   ? Status Achieved   ? ?  ?  ? ?  ? ? ? ? PT Long Term Goals - 08/08/21 1010   ? ?  ? PT LONG TERM GOAL #1  ? Title Pt will be I and compliant with HEP.   ? Status Partially Met   ?  ? PT LONG TERM GOAL #2  ? Title Pt will improv BERG balance score 47/56 to show improved balance   ? Status Partially Met   ? ?  ?  ? ?  ? ? ? ? ? ? ? ? Plan - 08/08/21 1007   ? ? Clinical Impression Statement Struggled at first with the airex standing, but when we went back to it after other stuff was able to do it.  Still tends to not use the hip strategy , trying to get him to use it   ? PT Next Visit Plan see how he does back in town   ? Consulted and Agree with Plan of Care Patient   ? ?  ?  ? ?  ? ? ?Patient will benefit from skilled therapeutic intervention in order to improve the following deficits and impairments:  Abnormal gait, Decreased coordination, Difficulty walking, Cardiopulmonary status limiting activity, Decreased endurance, Decreased activity tolerance, Decreased balance, Postural dysfunction, Decreased strength, Decreased mobility ? ?Visit Diagnosis: ?Muscle weakness (generalized) ? ?Difficulty in walking, not elsewhere classified ? ?Other abnormalities of gait and mobility ? ?Other lack of coordination ? ? ? ? ?Problem List ?Patient Active Problem List  ? Diagnosis Date Noted  ? Cerebral  thrombosis with cerebral infarction 11/25/2020  ? Acute right-sided weakness 11/24/2020  ? History of mitral valve repair 11/26/2018  ? Prosthetic valve dysfunction 11/25/2018  ? Ectatic abdominal aorta ([place])

## 2021-08-10 ENCOUNTER — Other Ambulatory Visit: Payer: Self-pay

## 2021-08-10 ENCOUNTER — Ambulatory Visit: Payer: Medicare Other | Admitting: Physical Therapy

## 2021-08-10 ENCOUNTER — Encounter: Payer: Self-pay | Admitting: Physical Therapy

## 2021-08-10 DIAGNOSIS — M6281 Muscle weakness (generalized): Secondary | ICD-10-CM | POA: Diagnosis not present

## 2021-08-10 DIAGNOSIS — R262 Difficulty in walking, not elsewhere classified: Secondary | ICD-10-CM

## 2021-08-10 DIAGNOSIS — R2689 Other abnormalities of gait and mobility: Secondary | ICD-10-CM

## 2021-08-10 NOTE — Therapy (Signed)
[name], [name], [address] ?Phone: [phone]   Fax:  [phone] ? ? ?Progress Note ?Reporting Period 07/04/21 to 08/10/21 for visit 11-20 ? ?See note below for Objective Data and Assessment of Progress/Goals.  ? ?  ? ? ?Physical Therapy Treatment ? ?Patient Details  ?Name: Eric [name] ?MRN: [id number] ?Date of Birth: Oct 03, 1929 ?Referring Provider (PT): Frann Rider ? ? ?Encounter Date: 08/10/2021 ? ? PT End of Session - 08/10/21 1153   ? ? Visit Number 20   ? Date for PT Re-Evaluation 08/22/21   ? Authorization Type UHC Medicare   ? PT Start Time [phone]   ? PT Stop Time 0539   ? PT Time Calculation (min) 44 min   ? Activity Tolerance Patient tolerated treatment well   ? Behavior During Therapy [facility] for tasks assessed/performed   ? ?  ?  ? ?  ? ? ?Past Medical History:  ?Diagnosis Date  ? Atrial flutter, paroxysmal ([place])   ? a. dx 04-02-2014, s/p  successful cardioversion 04-06-2014.  ? Chronic anticoagulation   ? on Eliquis--  due to recurrent dvt's and pe's  ? Dyslipidemia   ? History of bladder cancer urologist-  dr [name]  ? 02-13-2016  s/p TURBT per path high grade papillary urothelial carcinoma  ? History of DVT (deep vein thrombosis)   ? 2000-- RLE  ? History of melanoma excision   ? left flank;  07/ 2014 left lower leg and right upper chest  ? History of prostate cancer   ? Gleason 6--  s/p  radical prostatectomy 06/ 2000 in [place]---  no recurrence  ? History of pulmonary embolus (PE)   ? 2000 & 2005  bilateral  ? Hx of valvuloplasty 06/19/2007  ? a. s/p mitral ring annuloplasty, repair ruptured chordae of P2 flail segments of MV  ? S/P aortic valve replacement with bioprosthetic valve   ? 06-19-2007  severe aortic valve stenosis  ? 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 Single vessel coronary artery disease   cardiologist-  dr Irish Lack  ? a. 05/2007 CABG x 1: s/p SVG-OM;  b. 10/2010 Ex MV: EF 72%, inf attenuation w/o  ischemia, brief run of PAT with exercise. To  ? Stroke [facility])   ? Thrombocytopenia ([place])   ? Thyroid goiter 2013  ? nodular  ? Wears hearing aid   ? BILATERAL  ? Wears partial dentures   ? UPPER  ? ? ?Past Surgical History:  ?Procedure Laterality Date  ? AORTIC VALVE REPLACEMENT (AVR)/CORONARY ARTERY BYPASS GRAFTING (CABG)  06-19-2007  dr [name]  ? SVG to OM1 ;   AVR w/ #23 Mitralflow pericardial and ligation left atrial appendage;  MV repair w/ #28 Sorin 3-D memo Ring Annuloplasty with repair ruptured chordar of P-2 flail segments  ? CARDIAC CATHETERIZATION  05-12-2007  dr Leonia Reeves  ? single vessel 30% ostial LAD,  80% LCx;  severe to critial AS,  moderate MR,  normal LVF, ef 70%,  normal right heart pressures and cardiac outputs,  mild elevated LV end-diastolic pressure    ? CARDIOVASCULAR STRESS TEST  11-02-2010  dr Irish Lack  ? normal nuclear study w/ no ischemia or infarct/scar/  normal LV function and wall motion , ef 72%  ? CARDIOVERSION N/A 04/06/2014  ? Procedure: CARDIOVERSION;  Surgeon: [name], MD;  Location: [place];  Service: Cardiovascular;  Laterality: N/A;   successful  ? CATARACT EXTRACTION W/ INTRAOCULAR LENS  IMPLANT, BILATERAL  2012  approx  ? CORONARY ARTERY BYPASS GRAFT    ? PROSTATECTOMY    ? RETROPUBIC RADICAL PROSTATECTOMY  06/ 2000  in [place]  ? RIGHT/LEFT HEART CATH AND CORONARY/GRAFT ANGIOGRAPHY N/A 10/29/2018  ? Procedure: RIGHT/LEFT HEART CATH AND CORONARY/GRAFT ANGIOGRAPHY;  Surgeon: [name], MD;  Location: [place] CV LAB;  Service: Cardiovascular;  Laterality: N/A;  ? ROTATOR CUFF REPAIR Left 09/1998  ? TEE WITHOUT CARDIOVERSION N/A 04/06/2014  ? Procedure: TRANSESOPHAGEAL ECHOCARDIOGRAM (TEE);  Surgeon: [name], MD;  Location: [facility] ENDOSCOPY;  Service: Cardiovascular;  Laterality: N/A;  mild focal basa LVH of the septum, ef 50-55%, s/p AV annuloplasty ring, elongated chordae, thickened leaflets, mild to mod. MR, multiple small jets/arotic  bioprosthetic valve sits well in position, mild AI/ thickened TV w/ mild reurg./mild LA  ? TEE WITHOUT CARDIOVERSION N/A 10/22/2018  ? Procedure: TRANSESOPHAGEAL ECHOCARDIOGRAM (TEE);  Surgeon: [name], MD;  Location: [facility] ENDOSCOPY;  Service: Cardiovascular;  Laterality: N/A;  ? TEE WITHOUT CARDIOVERSION N/A 11/25/2018  ? Procedure: TRANSESOPHAGEAL ECHOCARDIOGRAM (TEE);  Surgeon: [name], MD;  Location: [place];  Service: Open Heart Surgery;  Laterality: N/A;  ? TRANSCATHETER AORTIC VALVE REPLACEMENT, TRANSFEMORAL N/A 11/25/2018  ? Procedure: TRANSCATHETER AORTIC VALVE REPLACEMENT, TRANSFEMORAL;  Surgeon: [name], MD;  Location: [place];  Service: Open Heart Surgery;  Laterality: N/A;  ? TRANSTHORACIC ECHOCARDIOGRAM  03-23-2016   dr Irish Lack  ? EF 55-60%, reduced contribution atrial contraction to ventricular filling, due to increased ventricular diastolic pressure or atrial contratile dysfunction/  bioprosthetic AV w/ mod. regurg. (valve area 1.97cm^2)/ mild dilated ascending aorta/ mild thicken MV normal function annular ring prosthesis/severe LAE & mod. to sev.RAE/ PASP 32mHg/ mild TR/ ventricle septal motion show paradox  ? TRANSURETHRAL RESECTION OF BLADDER TUMOR N/A 02/13/2016  ? Procedure: TRANSURETHRAL RESECTION OF BLADDER TUMOR (TURBT);  Surgeon: [name] MD;  [facility]  Service: Urology;  Laterality: N/A;  ? TRANSURETHRAL RESECTION OF BLADDER TUMOR N/A 09/04/2016  ? Procedure: TRANSURETHRAL RESECTION OF BLADDER TUMOR (TURBT);  Surgeon: [name] MD;  [facility]  Service: Urology;  Laterality: N/A;  ? ? ?There were no vitals filed for this visit. ? ? Subjective Assessment - 08/10/21 0849   ? ? Subjective No falls, I think I did okay after the trip, not too tired   ? Currently in Pain? No/denies   ? ?  ?  ? ?  ? ? ? ? ? ? ? ? ? ? ? ? ? ? ? ? ? ? ? ? O[place]Adult PT Treatment/Exercise - 08/10/21 0001   ? ?  ?  Ambulation/Gait  ? Gait Comments gait two laps around the back building working on a faster speed, no issues with toe drag and I could not hear a louder foot fall today, I usually here this going doen the slope   ?  ? High Level Balance  ? High Level Balance Comments on airex ball toss, marches and cone toe touches, airex balance beam tandem walk and side stepping   ?  ? Knee/Hip Exercises: Stretches  ? Gastroc Stretch Both;3 reps;20 seconds   ?  ? Knee/Hip Exercises: Aerobic  ? Recumbent Bike level 4 x 5 minutes   ?  ? Knee/Hip Exercises: Machines for Strengthening  ? Other Machine 25# rows, lats   ? ?  ?  ? ?  ? ? ? ? ? ? ? ? ? ? ? ?  PT Short Term Goals - 06/06/21 0932   ? ?  ? PT SHORT TERM GOAL #1  ? Title Pt will be I and compliant with initial HEP.   ? Status Achieved   ? ?  ?  ? ?  ? ? ? ? PT Long Term Goals - 08/10/21 1155   ? ?  ? PT LONG TERM GOAL #1  ? Title Pt will be I and compliant with HEP.   ? Status Partially Met   ?  ? PT LONG TERM GOAL #2  ? Title Pt will improv BERG balance score 47/56 to show improved balance   ?  ? PT LONG TERM GOAL #3  ? Title decrease TUG time to 12 seconds   ? Status Partially Met   ? ?  ?  ? ?  ? ? ? ? ? ? ? ? Plan - 08/10/21 1153   ? ? Clinical Impression Statement He is not having as much difficulty with the heavy foot fall on the left with going down a slope, still struggles with the dynamic surfaces but again he has a good step reaction, minimal hip reaction at times, but we are working on this and he is starting to exhibit more of this   ? PT Next Visit Plan progress the balance and activity   ? Consulted and Agree with Plan of Care Patient   ? ?  ?  ? ?  ? ? ?Patient will benefit from skilled therapeutic intervention in order to improve the following deficits and impairments:  Abnormal gait, Decreased coordination, Difficulty walking, Cardiopulmonary status limiting activity, Decreased endurance, Decreased activity tolerance, Decreased balance, Postural dysfunction,  Decreased strength, Decreased mobility ? ?Visit Diagnosis: ?Muscle weakness (generalized) ? ?Difficulty in walking, not elsewhere classified ? ?Other abnormalities of gait and mobility ? ? ? ? ?Problem List ?Patient Active

## 2021-08-15 ENCOUNTER — Other Ambulatory Visit: Payer: Self-pay

## 2021-08-15 ENCOUNTER — Ambulatory Visit: Payer: Medicare Other | Admitting: Physical Therapy

## 2021-08-15 ENCOUNTER — Encounter: Payer: Self-pay | Admitting: Physical Therapy

## 2021-08-15 DIAGNOSIS — R262 Difficulty in walking, not elsewhere classified: Secondary | ICD-10-CM

## 2021-08-15 DIAGNOSIS — M6281 Muscle weakness (generalized): Secondary | ICD-10-CM

## 2021-08-15 DIAGNOSIS — R2689 Other abnormalities of gait and mobility: Secondary | ICD-10-CM

## 2021-08-15 NOTE — Therapy (Signed)
[place] [address] [address] ?[place], [name], [address] ?Phone: [phone]   Fax:  [phone] ? ?Physical Therapy Treatment ? ?Patient Details  ?Name: Eric [name] ?MRN: [id number] ?Date of Birth: Nov 30, 1929 ?Referring Provider (PT): Frann Rider ? ? ?Encounter Date: 08/15/2021 ? ? PT End of Session - 08/15/21 0940   ? ? Visit Number 21   ? Date for PT Re-Evaluation 08/22/21   ? Authorization Type UHC Medicare   ? PT Start Time 0845   ? PT Stop Time 8502   ? PT Time Calculation (min) 50 min   ? Activity Tolerance Patient tolerated treatment well   ? Behavior During Therapy [facility] for tasks assessed/performed   ? ?  ?  ? ?  ? ? ?Past Medical History:  ?Diagnosis Date  ? Atrial flutter, paroxysmal ([place])   ? a. dx 04-02-2014, s/p  successful cardioversion 04-06-2014.  ? Chronic anticoagulation   ? on Eliquis--  due to recurrent dvt's and pe's  ? Dyslipidemia   ? History of bladder cancer urologist-  dr [name]  ? 02-13-2016  s/p TURBT per path high grade papillary urothelial carcinoma  ? History of DVT (deep vein thrombosis)   ? 2000-- RLE  ? History of melanoma excision   ? left flank;  07/ 2014 left lower leg and right upper chest  ? History of prostate cancer   ? Gleason 6--  s/p  radical prostatectomy 06/ 2000 in [place]---  no recurrence  ? History of pulmonary embolus (PE)   ? 2000 & 2005  bilateral  ? Hx of valvuloplasty 06/19/2007  ? a. s/p mitral ring annuloplasty, repair ruptured chordae of P2 flail segments of MV  ? S/P aortic valve replacement with bioprosthetic valve   ? 06-19-2007  severe aortic valve stenosis  ? 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 Single vessel coronary artery disease   cardiologist-  dr Irish Lack  ? a. 05/2007 CABG x 1: s/p SVG-OM;  b. 10/2010 Ex MV: EF 72%, inf attenuation w/o ischemia, brief run of PAT with exercise. To  ? Stroke [facility])   ? Thrombocytopenia ([place])   ? Thyroid goiter 2013  ? nodular  ? Wears hearing aid   ? BILATERAL   ? Wears partial dentures   ? UPPER  ? ? ?Past Surgical History:  ?Procedure Laterality Date  ? AORTIC VALVE REPLACEMENT (AVR)/CORONARY ARTERY BYPASS GRAFTING (CABG)  06-19-2007  dr [name]  ? SVG to OM1 ;   AVR w/ #23 Mitralflow pericardial and ligation left atrial appendage;  MV repair w/ #28 Sorin 3-D memo Ring Annuloplasty with repair ruptured chordar of P-2 flail segments  ? CARDIAC CATHETERIZATION  05-12-2007  dr Leonia Reeves  ? single vessel 30% ostial LAD,  80% LCx;  severe to critial AS,  moderate MR,  normal LVF, ef 70%,  normal right heart pressures and cardiac outputs,  mild elevated LV end-diastolic pressure    ? CARDIOVASCULAR STRESS TEST  11-02-2010  dr Irish Lack  ? normal nuclear study w/ no ischemia or infarct/scar/  normal LV function and wall motion , ef 72%  ? CARDIOVERSION N/A 04/06/2014  ? Procedure: CARDIOVERSION;  Surgeon: [name], MD;  Location: [place];  Service: Cardiovascular;  Laterality: N/A;   successful  ? CATARACT EXTRACTION W/ INTRAOCULAR LENS  IMPLANT, BILATERAL  2012  approx  ? CORONARY ARTERY BYPASS GRAFT    ? PROSTATECTOMY    ? RETROPUBIC RADICAL PROSTATECTOMY  06/ 2000  in [place], [place]  ? RIGHT/LEFT HEART CATH AND CORONARY/GRAFT ANGIOGRAPHY N/A 10/29/2018  ? Procedure: RIGHT/LEFT HEART CATH AND CORONARY/GRAFT ANGIOGRAPHY;  Surgeon: [name], MD;  Location: [place] CV LAB;  Service: Cardiovascular;  Laterality: N/A;  ? ROTATOR CUFF REPAIR Left 09/1998  ? TEE WITHOUT CARDIOVERSION N/A 04/06/2014  ? Procedure: TRANSESOPHAGEAL ECHOCARDIOGRAM (TEE);  Surgeon: [name], MD;  Location: [facility] ENDOSCOPY;  Service: Cardiovascular;  Laterality: N/A;  mild focal basa LVH of the septum, ef 50-55%, s/p AV annuloplasty ring, elongated chordae, thickened leaflets, mild to mod. MR, multiple small jets/arotic bioprosthetic valve sits well in position, mild AI/ thickened TV w/ mild reurg./mild LA  ? TEE WITHOUT CARDIOVERSION N/A 10/22/2018  ? Procedure: TRANSESOPHAGEAL  ECHOCARDIOGRAM (TEE);  Surgeon: [name], MD;  Location: [facility] ENDOSCOPY;  Service: Cardiovascular;  Laterality: N/A;  ? TEE WITHOUT CARDIOVERSION N/A 11/25/2018  ? Procedure: TRANSESOPHAGEAL ECHOCARDIOGRAM (TEE);  Surgeon: [name], MD;  Location: [place];  Service: Open Heart Surgery;  Laterality: N/A;  ? TRANSCATHETER AORTIC VALVE REPLACEMENT, TRANSFEMORAL N/A 11/25/2018  ? Procedure: TRANSCATHETER AORTIC VALVE REPLACEMENT, TRANSFEMORAL;  Surgeon: [name], MD;  Location: [place];  Service: Open Heart Surgery;  Laterality: N/A;  ? TRANSTHORACIC ECHOCARDIOGRAM  03-23-2016   dr Irish Lack  ? EF 55-60%, reduced contribution atrial contraction to ventricular filling, due to increased ventricular diastolic pressure or atrial contratile dysfunction/  bioprosthetic AV w/ mod. regurg. (valve area 1.97cm^2)/ mild dilated ascending aorta/ mild thicken MV normal function annular ring prosthesis/severe LAE & mod. to sev.RAE/ PASP 75mHg/ mild TR/ ventricle septal motion show paradox  ? TRANSURETHRAL RESECTION OF BLADDER TUMOR N/A 02/13/2016  ? Procedure: TRANSURETHRAL RESECTION OF BLADDER TUMOR (TURBT);  Surgeon: [name] MD;  [facility]  Service: Urology;  Laterality: N/A;  ? TRANSURETHRAL RESECTION OF BLADDER TUMOR N/A 09/04/2016  ? Procedure: TRANSURETHRAL RESECTION OF BLADDER TUMOR (TURBT);  Surgeon: [name] MD;  [facility]  Service: Urology;  Laterality: N/A;  ? ? ?There were no vitals filed for this visit. ? ? Subjective Assessment - 08/15/21 0851   ? ? Subjective Doing well, no complications   ? Currently in Pain? No/denies   ? ?  ?  ? ?  ? ? ? ? ? [facility] PT Assessment - 08/15/21 0001   ? ?  ? Berg Balance Test  ? Sit to Stand Able to stand without using hands and stabilize independently   ? Standing Unsupported Able to stand safely 2 minutes   ? Sitting with Back Unsupported but Feet Supported on Floor or Stool Able to sit safely  and securely 2 minutes   ? Stand to Sit Sits safely with minimal use of hands   ? Transfers Able to transfer safely, definite need of hands   ? Standing Unsupported with Eyes Closed Able to stand 10 seconds with supervision   ? Standing Unsupported with Feet Together Able to place feet together independently and stand 1 minute safely   ? From Standing, Reach Forward with Outstretched Arm Can reach confidently >25 cm (10")   ? From Standing Position, [place] safely and easily   ? From Standing Position, Turn to Look Behind Over each Shoulder Turn sideways only but maintains balance   ? Turn 360 Degrees Able to turn 360 degrees safely one side only in 4 seconds or less   ? Standing Unsupported, Alternately Place Feet on  Step/Stool Able to stand independently and complete 8 steps >20 seconds   ? Standing Unsupported, One Foot in Front Able to take small step independently and hold 30 seconds   ? Standing on One Leg Tries to lift leg/unable to hold 3 seconds but remains standing independently   ? Total Score 45   ?  ? Timed Up and Go Test  ? Normal TUG (seconds) 9   ? ?  ?  ? ?  ? ? ? ? ? ? ? ? ? ? ? ? ? ? ? ? [facility] Adult PT Treatment/Exercise - 08/15/21 0001   ? ?  ? Ambulation/Gait  ? Gait Comments fast lap around the back building, slowed as he got going up the slope   ?  ? High Level Balance  ? High Level Balance Comments in pbars bosu standing   ?  ? Knee/Hip Exercises: Aerobic  ? Recumbent Bike level 4 x 6 minutes   ?  ? Knee/Hip Exercises: Machines for Strengthening  ? Cybex Knee Extension 10lb 3x10   ? Cybex Knee Flexion 35lb 3x10   ? ?  ?  ? ?  ? ? ? ? ? ? ? ? ? ? ? ? PT Short Term Goals - 06/06/21 0932   ? ?  ? PT SHORT TERM GOAL #1  ? Title Pt will be I and compliant with initial HEP.   ? Status Achieved   ? ?  ?  ? ?  ? ? ? ? PT Long Term Goals - 08/15/21 0943   ? ?  ? PT LONG TERM GOAL #1  ? Title Pt will be I and compliant with HEP.   ? Status Partially Met   ?  ? PT  LONG TERM GOAL #2  ? Title Pt will improv BERG balance score 47/56 to show improved balance   ? Status Partially Met   ?  ? PT LONG TERM GOAL #3  ? Title decrease TUG time to 12 seconds   ? Status Achieved

## 2021-08-17 ENCOUNTER — Ambulatory Visit: Payer: Medicare Other | Admitting: Physical Therapy

## 2021-08-17 ENCOUNTER — Encounter: Payer: Self-pay | Admitting: Physical Therapy

## 2021-08-17 DIAGNOSIS — R262 Difficulty in walking, not elsewhere classified: Secondary | ICD-10-CM

## 2021-08-17 DIAGNOSIS — M6281 Muscle weakness (generalized): Secondary | ICD-10-CM

## 2021-08-17 DIAGNOSIS — R2689 Other abnormalities of gait and mobility: Secondary | ICD-10-CM

## 2021-08-17 NOTE — Therapy (Signed)
[place] ?[place] ?[address] ?[place], [name], [address] ?Phone: [phone]   Fax:  [phone] ? ?Physical Therapy Treatment ? ?Patient Details  ?Name: Eric [name] ?MRN: [id number] ?Date of Birth: Sep 24, 1929 ?Referring Provider (PT): Frann Rider ? ? ?Encounter Date: 08/17/2021 ? ? PT End of Session - 08/17/21 1013   ? ? Visit Number 22   ? Date for PT Re-Evaluation 08/22/21   ? Authorization Type UHC Medicare   ? PT Start Time [phone]   ? PT Stop Time [phone]   ? PT Time Calculation (min) 45 min   ? Activity Tolerance Patient tolerated treatment well   ? Behavior During Therapy [facility] for tasks assessed/performed   ? ?  ?  ? ?  ? ? ?Past Medical History:  ?Diagnosis Date  ? Atrial flutter, paroxysmal ([place])   ? a. dx 04-02-2014, s/p  successful cardioversion 04-06-2014.  ? Chronic anticoagulation   ? on Eliquis--  due to recurrent dvt's and pe's  ? Dyslipidemia   ? History of bladder cancer urologist-  dr [name]  ? 02-13-2016  s/p TURBT per path high grade papillary urothelial carcinoma  ? History of DVT (deep vein thrombosis)   ? 2000-- RLE  ? History of melanoma excision   ? left flank;  07/ 2014 left lower leg and right upper chest  ? History of prostate cancer   ? Gleason 6--  s/p  radical prostatectomy 06/ 2000 in [place]---  no recurrence  ? History of pulmonary embolus (PE)   ? 2000 & 2005  bilateral  ? Hx of valvuloplasty 06/19/2007  ? a. s/p mitral ring annuloplasty, repair ruptured chordae of P2 flail segments of MV  ? S/P aortic valve replacement with bioprosthetic valve   ? 06-19-2007  severe aortic valve stenosis  ? 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 Single vessel coronary artery disease   cardiologist-  dr Irish Lack  ? a. 05/2007 CABG x 1: s/p SVG-OM;  b. 10/2010 Ex MV: EF 72%, inf attenuation w/o ischemia, brief run of PAT with exercise. To  ? Stroke [facility])   ? Thrombocytopenia ([place])   ? Thyroid goiter 2013  ? nodular  ? Wears hearing aid   ? BILATERAL   ? Wears partial dentures   ? UPPER  ? ? ?Past Surgical History:  ?Procedure Laterality Date  ? AORTIC VALVE REPLACEMENT (AVR)/CORONARY ARTERY BYPASS GRAFTING (CABG)  06-19-2007  dr [name]  ? SVG to OM1 ;   AVR w/ #23 Mitralflow pericardial and ligation left atrial appendage;  MV repair w/ #28 Sorin 3-D memo Ring Annuloplasty with repair ruptured chordar of P-2 flail segments  ? CARDIAC CATHETERIZATION  05-12-2007  dr Leonia Reeves  ? single vessel 30% ostial LAD,  80% LCx;  severe to critial AS,  moderate MR,  normal LVF, ef 70%,  normal right heart pressures and cardiac outputs,  mild elevated LV end-diastolic pressure    ? CARDIOVASCULAR STRESS TEST  11-02-2010  dr Irish Lack  ? normal nuclear study w/ no ischemia or infarct/scar/  normal LV function and wall motion , ef 72%  ? CARDIOVERSION N/A 04/06/2014  ? Procedure: CARDIOVERSION;  Surgeon: [name], MD;  Location: [place];  Service: Cardiovascular;  Laterality: N/A;   successful  ? CATARACT EXTRACTION W/ INTRAOCULAR LENS  IMPLANT, BILATERAL  2012  approx  ? CORONARY ARTERY BYPASS GRAFT    ? PROSTATECTOMY    ? RETROPUBIC RADICAL PROSTATECTOMY  06/ 2000  in [place], [place]  ? RIGHT/LEFT HEART CATH AND CORONARY/GRAFT ANGIOGRAPHY N/A 10/29/2018  ? Procedure: RIGHT/LEFT HEART CATH AND CORONARY/GRAFT ANGIOGRAPHY;  Surgeon: [name], MD;  Location: [place] CV LAB;  Service: Cardiovascular;  Laterality: N/A;  ? ROTATOR CUFF REPAIR Left 09/1998  ? TEE WITHOUT CARDIOVERSION N/A 04/06/2014  ? Procedure: TRANSESOPHAGEAL ECHOCARDIOGRAM (TEE);  Surgeon: [name], MD;  Location: [facility] ENDOSCOPY;  Service: Cardiovascular;  Laterality: N/A;  mild focal basa LVH of the septum, ef 50-55%, s/p AV annuloplasty ring, elongated chordae, thickened leaflets, mild to mod. MR, multiple small jets/arotic bioprosthetic valve sits well in position, mild AI/ thickened TV w/ mild reurg./mild LA  ? TEE WITHOUT CARDIOVERSION N/A 10/22/2018  ? Procedure: TRANSESOPHAGEAL  ECHOCARDIOGRAM (TEE);  Surgeon: [name], MD;  Location: [facility] ENDOSCOPY;  Service: Cardiovascular;  Laterality: N/A;  ? TEE WITHOUT CARDIOVERSION N/A 11/25/2018  ? Procedure: TRANSESOPHAGEAL ECHOCARDIOGRAM (TEE);  Surgeon: [name], MD;  Location: [place];  Service: Open Heart Surgery;  Laterality: N/A;  ? TRANSCATHETER AORTIC VALVE REPLACEMENT, TRANSFEMORAL N/A 11/25/2018  ? Procedure: TRANSCATHETER AORTIC VALVE REPLACEMENT, TRANSFEMORAL;  Surgeon: [name], MD;  Location: [place];  Service: Open Heart Surgery;  Laterality: N/A;  ? TRANSTHORACIC ECHOCARDIOGRAM  03-23-2016   dr Irish Lack  ? EF 55-60%, reduced contribution atrial contraction to ventricular filling, due to increased ventricular diastolic pressure or atrial contratile dysfunction/  bioprosthetic AV w/ mod. regurg. (valve area 1.97cm^2)/ mild dilated ascending aorta/ mild thicken MV normal function annular ring prosthesis/severe LAE & mod. to sev.RAE/ PASP 20mHg/ mild TR/ ventricle septal motion show paradox  ? TRANSURETHRAL RESECTION OF BLADDER TUMOR N/A 02/13/2016  ? Procedure: TRANSURETHRAL RESECTION OF BLADDER TUMOR (TURBT);  Surgeon: [name] MD;  [facility]  Service: Urology;  Laterality: N/A;  ? TRANSURETHRAL RESECTION OF BLADDER TUMOR N/A 09/04/2016  ? Procedure: TRANSURETHRAL RESECTION OF BLADDER TUMOR (TURBT);  Surgeon: [name] MD;  [facility]  Service: Urology;  Laterality: N/A;  ? ? ?There were no vitals filed for this visit. ? ? Subjective Assessment - 08/17/21 0850   ? ? Subjective I agree with what you are talking about with getting me to the gym but I am not sure if I can do it and be safe   ? Currently in Pain? No/denies   ? ?  ?  ? ?  ? ? ? ? ? ? ? ? ? ? ? ? ? ? ? ? ? ? ? ? [facility] Treatment/Exercise - 08/17/21 0001   ? ?  ? High Level Balance  ? High Level Balance Comments up and down from the floor, side stepping on mat, backward walking  on mat, figure 8's on mat   ?  ? Knee/Hip Exercises: Aerobic  ? Recumbent Bike level 6 x 6 minutes   ?  ? Knee/Hip Exercises: Machines for Strengthening  ? Cybex Knee Extension 10lb 3x10   ? Cybex Knee Flexion 35lb 3x10   ? Other Machine 35# rows, lats   ? ?  ?  ? ?  ? ? ? ? ? ? ? ? ? ? ? ? PT Short Term Goals - 06/06/21 0932   ? ?  ? PT SHORT TERM GOAL #1  ? Title Pt will be I and compliant with initial HEP.   ? Status Achieved   ? ?  ?  ? ?  ? ? ? ? PT Long Term Goals -  08/17/21 1016   ? ?  ? PT LONG TERM GOAL #1  ? Title Pt will be I and compliant with HEP.   ? Status Partially Met   ?  ? PT LONG TERM GOAL #2  ? Title Pt will improv BERG balance score 47/56 to show improved balance   ? Status Partially Met   ? ?  ?  ? ?  ? ? ? ? ? ? ? ? Plan - 08/17/21 1014   ? ? Clinical Impression Statement Patient has some trepidation with the thought of going to a gym, we discussed this and talked about how to be safe and I would write up a program and would talk with the trainer, he reports that he called and he cannot see the trainer for about 2-3 weeks.   ? PT Next Visit Plan will see through April and try to decrease frequency some and work with him and trainer   ? Consulted and Agree with Plan of Care Patient   ? ?  ?  ? ?  ? ? ?Patient will benefit from skilled therapeutic intervention in order to improve the following deficits and impairments:  Abnormal gait, Decreased coordination, Difficulty walking, Cardiopulmonary status limiting activity, Decreased endurance, Decreased activity tolerance, Decreased balance, Postural dysfunction, Decreased strength, Decreased mobility ? ?Visit Diagnosis: ?Muscle weakness (generalized) ? ?Difficulty in walking, not elsewhere classified ? ?Other abnormalities of gait and mobility ? ? ? ? ?Problem List ?Patient Active Problem List  ? Diagnosis Date Noted  ? Cerebral thrombosis with cerebral infarction 11/25/2020  ? Acute right-sided weakness 11/24/2020  ? History of mitral valve  repair 11/26/2018  ? Prosthetic valve dysfunction 11/25/2018  ? Ectatic abdominal aorta ([place]) 11/25/2018  ? 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 History of bladder cancer   ? History of pulmonary embolus (PE

## 2021-08-22 ENCOUNTER — Encounter: Payer: Self-pay | Admitting: Physical Therapy

## 2021-08-22 ENCOUNTER — Ambulatory Visit: Payer: Medicare Other | Attending: Internal Medicine | Admitting: Physical Therapy

## 2021-08-22 DIAGNOSIS — R262 Difficulty in walking, not elsewhere classified: Secondary | ICD-10-CM

## 2021-08-22 DIAGNOSIS — M6281 Muscle weakness (generalized): Secondary | ICD-10-CM

## 2021-08-22 DIAGNOSIS — R2689 Other abnormalities of gait and mobility: Secondary | ICD-10-CM | POA: Diagnosis present

## 2021-08-22 DIAGNOSIS — R278 Other lack of coordination: Secondary | ICD-10-CM | POA: Insufficient documentation

## 2021-08-22 NOTE — Therapy (Signed)
[place] [address] ?[place], [name], [address] ?Phone: [phone]   Fax:  [phone] ? ?Physical Therapy Treatment ? ?Patient Details  ?Name: Eric [name] ?MRN: [id number] ?Date of Birth: Oct 11, 1929 ?Referring Provider (PT): Frann Rider ? ? ?Encounter Date: 08/22/2021 ? ? PT End of Session - 08/22/21 0840   ? ? Visit Number 23   ? Date for PT Re-Evaluation 08/22/21   ? Authorization Type U[employer] Medicare   ? PT Start Time 0800   ? PT Stop Time 0845   ? PT Time Calculation (min) 45 min   ? Activity Tolerance Patient tolerated treatment well   ? Behavior During Therapy [facility] for tasks assessed/performed   ? ?  ?  ? ?  ? ? ?Past Medical History:  ?Diagnosis Date  ? Atrial flutter, paroxysmal ([place])   ? a. dx 04-02-2014, s/p  successful cardioversion 04-06-2014.  ? Chronic anticoagulation   ? on Eliquis--  due to recurrent dvt's and pe's  ? Dyslipidemia   ? History of bladder cancer urologist-  dr [name]  ? 02-13-2016  s/p TURBT per path high grade papillary urothelial carcinoma  ? History of DVT (deep vein thrombosis)   ? 2000-- RLE  ? History of melanoma excision   ? left flank;  07/ 2014 left lower leg and right upper chest  ? History of prostate cancer   ? Gleason 6--  s/p  radical prostatectomy 06/ 2000 in [place]---  no recurrence  ? History of pulmonary embolus (PE)   ? 2000 & 2005  bilateral  ? Hx of valvuloplasty 06/19/2007  ? a. s/p mitral ring annuloplasty, repair ruptured chordae of P2 flail segments of MV  ? S/P aortic valve replacement with bioprosthetic valve   ? 06-19-2007  severe aortic valve stenosis  ? 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 Single vessel coronary artery disease   cardiologist-  dr Irish Lack  ? a. 05/2007 CABG x 1: s/p SVG-OM;  b. 10/2010 Ex MV: EF 72%, inf attenuation w/o ischemia, brief run of PAT with exercise. To  ? Stroke [facility])   ? Thrombocytopenia ([place])   ? Thyroid goiter 2013  ? nodular  ? Wears hearing aid   ? BILATERAL   ? Wears partial dentures   ? UPPER  ? ? ?Past Surgical History:  ?Procedure Laterality Date  ? AORTIC VALVE REPLACEMENT (AVR)/CORONARY ARTERY BYPASS GRAFTING (CABG)  06-19-2007  dr [name]  ? SVG to OM1 ;   AVR w/ #23 Mitralflow pericardial and ligation left atrial appendage;  MV repair w/ #28 Sorin 3-D memo Ring Annuloplasty with repair ruptured chordar of P-2 flail segments  ? CARDIAC CATHETERIZATION  05-12-2007  dr Leonia Reeves  ? single vessel 30% ostial LAD,  80% LCx;  severe to critial AS,  moderate MR,  normal LVF, ef 70%,  normal right heart pressures and cardiac outputs,  mild elevated LV end-diastolic pressure    ? CARDIOVASCULAR STRESS TEST  11-02-2010  dr Irish Lack  ? normal nuclear study w/ no ischemia or infarct/scar/  normal LV function and wall motion , ef 72%  ? CARDIOVERSION N/A 04/06/2014  ? Procedure: CARDIOVERSION;  Surgeon: [name], MD;  Location: [place];  Service: Cardiovascular;  Laterality: N/A;   successful  ? CATARACT EXTRACTION W/ INTRAOCULAR LENS  IMPLANT, BILATERAL  2012  approx  ? CORONARY ARTERY BYPASS GRAFT    ? PROSTATECTOMY    ? RETROPUBIC RADICAL PROSTATECTOMY  06/ 2000  in [place], [place]  ? RIGHT/LEFT HEART CATH AND CORONARY/GRAFT ANGIOGRAPHY N/A 10/29/2018  ? Procedure: RIGHT/LEFT HEART CATH AND CORONARY/GRAFT ANGIOGRAPHY;  Surgeon: [name], MD;  Location: [place] CV LAB;  Service: Cardiovascular;  Laterality: N/A;  ? ROTATOR CUFF REPAIR Left 09/1998  ? TEE WITHOUT CARDIOVERSION N/A 04/06/2014  ? Procedure: TRANSESOPHAGEAL ECHOCARDIOGRAM (TEE);  Surgeon: [name], MD;  Location: [facility] ENDOSCOPY;  Service: Cardiovascular;  Laterality: N/A;  mild focal basa LVH of the septum, ef 50-55%, s/p AV annuloplasty ring, elongated chordae, thickened leaflets, mild to mod. MR, multiple small jets/arotic bioprosthetic valve sits well in position, mild AI/ thickened TV w/ mild reurg./mild LA  ? TEE WITHOUT CARDIOVERSION N/A 10/22/2018  ? Procedure: TRANSESOPHAGEAL  ECHOCARDIOGRAM (TEE);  Surgeon: [name], MD;  Location[facility] ENDOSCOPY;  Service: Cardiovascular;  Laterality: N/A;  ? TEE WITHOUT CARDIOVERSION N/A 11/25/2018  ? Procedure: TRANSESOPHAGEAL ECHOCARDIOGRAM (TEE);  Surgeon: [name], MD;  Location: [place];  Service: Open Heart Surgery;  Laterality: N/A;  ? TRANSCATHETER AORTIC VALVE REPLACEMENT, TRANSFEMORAL N/A 11/25/2018  ? Procedure: TRANSCATHETER AORTIC VALVE REPLACEMENT, TRANSFEMORAL;  Surgeon: [name], MD;  Location: [place];  Service: Open Heart Surgery;  Laterality: N/A;  ? TRANSTHORACIC ECHOCARDIOGRAM  03-23-2016   dr Irish Lack  ? EF 55-60%, reduced contribution atrial contraction to ventricular filling, due to increased ventricular diastolic pressure or atrial contratile dysfunction/  bioprosthetic AV w/ mod. regurg. (valve area 1.97cm^2)/ mild dilated ascending aorta/ mild thicken MV normal function annular ring prosthesis/severe LAE & mod. to sev.RAE/ PASP 51mHg/ mild TR/ ventricle septal motion show paradox  ? TRANSURETHRAL RESECTION OF BLADDER TUMOR N/A 02/13/2016  ? Procedure: TRANSURETHRAL RESECTION OF BLADDER TUMOR (TURBT);  Surgeon: [name] MD;  [facility]  Service: Urology;  Laterality: N/A;  ? TRANSURETHRAL RESECTION OF BLADDER TUMOR N/A 09/04/2016  ? Procedure: TRANSURETHRAL RESECTION OF BLADDER TUMOR (TURBT);  Surgeon: [name] MD;  [facility]  Service: Urology;  Laterality: N/A;  ? ? ?There were no vitals filed for this visit. ? ? Subjective Assessment - 08/22/21 0801   ? ? Subjective "Im doing well"   ? Currently in Pain? No/denies   ? ?  ?  ? ?  ? ? ? ? ? ? ? ? ? ? ? ? ? ? ? ? ? ? ? ? [place]Exercise - 08/22/21 0001   ? ?  ? Ambulation/Gait  ? Gait Comments fast lap around the back building, slowed as he got going up the slope   ?  ? Knee/Hip Exercises: Aerobic  ? Nustep L5 x 6 min   ?  ? Knee/Hip Exercises: Machines for Strengthening   ? Cybex Knee Extension 10lb 3x10   ? Cybex Knee Flexion 35lb 3x10   ? Other Machine 35# rows, lats   ?  ? Knee/Hip Exercises: Standing  ? Other Standing Knee Exercises Biceps 25lb 2x10   ? Other Standing Knee Exercises triceps 25lb 2x10   ? ?  ?  ? ?  ? ? ? ? ? ? ? ? ? ? ? ? PT Short Term Goals - 06/06/21 0932   ? ?  ? PT SHORT TERM GOAL #1  ? Title Pt will be I and compliant with initial HEP.   ? Status Achieved   ? ?  ?  ? ?  ? ? ? ? PT Long Term Goals - 08/17/21 1016   ? ?  ?  PT LONG TERM GOAL #1  ? Title Pt will be I and compliant with HEP.   ? Status Partially Met   ?  ? PT LONG TERM GOAL #2  ? Title Pt will improv BERG balance score 47/56 to show improved balance   ? Status Partially Met   ? ?  ?  ? ?  ? ? ? ? ? ? ? ? Plan - 08/22/21 0842   ? ? Clinical Impression Statement Pt enters clinic feeling well reporting no falls. Today's session consisted of all gym HEP interventions for independent program. He did well with outdoor ambulation but gait speed did decrease with uphill ambulation. Postural cue needed with seated rows.   ? Stability/Clinical Decision Making Stable/Uncomplicated   ? Rehab Potential Good   ? PT Frequency 2x / week   ? PT Duration 12 weeks   ? PT Treatment/Interventions ADLs/Self Care Home Management;Gait training;Neuromuscular re-education;Balance training;Therapeutic exercise;Therapeutic activities;Functional mobility training;Stair training;Patient/family education;Manual techniques   ? PT Next Visit Plan will see through April and try to decrease frequency some and work with him and trainer   ? ?  ?  ? ?  ? ? ?Patient will benefit from skilled therapeutic intervention in order to improve the following deficits and impairments:  Abnormal gait, Decreased coordination, Difficulty walking, Cardiopulmonary status limiting activity, Decreased endurance, Decreased activity tolerance, Decreased balance, Postural dysfunction, Decreased strength, Decreased mobility ? ?Visit Diagnosis: ?Muscle  weakness (generalized) ? ?Other abnormalities of gait and mobility ? ?Difficulty in walking, not elsewhere classified ? ? ? ? ?Problem List ?Patient Active Problem List  ? Diagnosis Date Noted  ? Cerebral

## 2021-08-25 ENCOUNTER — Ambulatory Visit: Payer: Medicare Other | Admitting: Physical Therapy

## 2021-08-25 ENCOUNTER — Encounter: Payer: Self-pay | Admitting: Physical Therapy

## 2021-08-25 DIAGNOSIS — R2689 Other abnormalities of gait and mobility: Secondary | ICD-10-CM

## 2021-08-25 DIAGNOSIS — M6281 Muscle weakness (generalized): Secondary | ICD-10-CM

## 2021-08-25 DIAGNOSIS — R278 Other lack of coordination: Secondary | ICD-10-CM

## 2021-08-25 DIAGNOSIS — R262 Difficulty in walking, not elsewhere classified: Secondary | ICD-10-CM

## 2021-08-25 NOTE — Therapy (Signed)
[place] ?[address] ?[place], [name], [address] ?Phone: [phone]   Fax:  [phone] ? ?Physical Therapy Treatment ? ?Patient Details  ?Name: Eric [name] ?MRN: [id number] ?Date of Birth: [birth year] ?Referring Provider (PT): Frann Rider ? ? ?Encounter Date: 08/25/2021 ? ? PT End of Session - 08/25/21 1055   ? ? Visit Number 24   ? Date for PT Re-Evaluation 09/24/21   ? Authorization Type UHC Medicare   ? PT Start Time 1014   ? PT Stop Time 1058   ? PT Time Calculation (min) 44 min   ? Activity Tolerance Patient tolerated treatment well   ? Behavior During Therapy [facility] for tasks assessed/performed   ? ?  ?  ? ?  ? ? ?Past Medical History:  ?Diagnosis Date  ? Atrial flutter, paroxysmal ([place])   ? a. dx 04-02-2014, s/p  successful cardioversion 04-06-2014.  ? Chronic anticoagulation   ? on Eliquis--  due to recurrent dvt's and pe's  ? Dyslipidemia   ? History of bladder cancer urologist-  dr [name]  ? 02-13-2016  s/p TURBT per path high grade papillary urothelial carcinoma  ? History of DVT (deep vein thrombosis)   ? 2000-- RLE  ? History of melanoma excision   ? left flank;  07/ 2014 left lower leg and right upper chest  ? History of prostate cancer   ? Gleason 6--  s/p  radical prostatectomy 06/ 2000 in [place]---  no recurrence  ? History of pulmonary embolus (PE)   ? 2000 & 2005  bilateral  ? Hx of valvuloplasty 06/19/2007  ? a. s/p mitral ring annuloplasty, repair ruptured chordae of P2 flail segments of MV  ? S/P aortic valve replacement with bioprosthetic valve   ? 06-19-2007  severe aortic valve stenosis  ? 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 Single vessel coronary artery disease   cardiologist-  dr Irish Lack  ? a. 05/2007 CABG x 1: s/p SVG-OM;  b. 10/2010 Ex MV: EF 72%, inf attenuation w/o ischemia, brief run of PAT with exercise. To  ? Stroke [facility])   ? Thrombocytopenia ([place])   ? Thyroid goiter 2013  ? nodular  ? Wears hearing aid   ? BILATERAL   ? Wears partial dentures   ? UPPER  ? ? ?Past Surgical History:  ?Procedure Laterality Date  ? AORTIC VALVE REPLACEMENT (AVR)/CORONARY ARTERY BYPASS GRAFTING (CABG)  06-19-2007  dr [name]  ? SVG to OM1 ;   AVR w/ #23 Mitralflow pericardial and ligation left atrial appendage;  MV repair w/ #28 Sorin 3-D memo Ring Annuloplasty with repair ruptured chordar of P-2 flail segments  ? CARDIAC CATHETERIZATION  05-12-2007  dr Leonia Reeves  ? single vessel 30% ostial LAD,  80% LCx;  severe to critial AS,  moderate MR,  normal LVF, ef 70%,  normal right heart pressures and cardiac outputs,  mild elevated LV end-diastolic pressure    ? CARDIOVASCULAR STRESS TEST  11-02-2010  dr Irish Lack  ? normal nuclear study w/ no ischemia or infarct/scar/  normal LV function and wall motion , ef 72%  ? CARDIOVERSION N/A 04/06/2014  ? Procedure: CARDIOVERSION;  Surgeon: [name], MD;  Location: [place];  Service: Cardiovascular;  Laterality: N/A;   successful  ? CATARACT EXTRACTION W/ INTRAOCULAR LENS  IMPLANT, BILATERAL  2012  approx  ? CORONARY ARTERY BYPASS GRAFT    ? PROSTATECTOMY    ? RETROPUBIC RADICAL PROSTATECTOMY  06/ 2000  in [place], [place]  ? RIGHT/LEFT HEART CATH AND CORONARY/GRAFT ANGIOGRAPHY N/A 10/29/2018  ? Procedure: RIGHT/LEFT HEART CATH AND CORONARY/GRAFT ANGIOGRAPHY;  Surgeon: [name], MD;  Location: [place] CV LAB;  Service: Cardiovascular;  Laterality: N/A;  ? ROTATOR CUFF REPAIR Left 09/1998  ? TEE WITHOUT CARDIOVERSION N/A 04/06/2014  ? Procedure: TRANSESOPHAGEAL ECHOCARDIOGRAM (TEE);  Surgeon: [name], MD;  Location: [facility] ENDOSCOPY;  Service: Cardiovascular;  Laterality: N/A;  mild focal basa LVH of the septum, ef 50-55%, s/p AV annuloplasty ring, elongated chordae, thickened leaflets, mild to mod. MR, multiple small jets/arotic bioprosthetic valve sits well in position, mild AI/ thickened TV w/ mild reurg./mild LA  ? TEE WITHOUT CARDIOVERSION N/A 10/22/2018  ? Procedure: TRANSESOPHAGEAL  ECHOCARDIOGRAM (TEE);  Surgeon: [name], MD;  Location: [facility] ENDOSCOPY;  Service: Cardiovascular;  Laterality: N/A;  ? TEE WITHOUT CARDIOVERSION N/A 11/25/2018  ? Procedure: TRANSESOPHAGEAL ECHOCARDIOGRAM (TEE);  Surgeon: [name], MD;  Location: [place];  Service: Open Heart Surgery;  Laterality: N/A;  ? TRANSCATHETER AORTIC VALVE REPLACEMENT, TRANSFEMORAL N/A 11/25/2018  ? Procedure: TRANSCATHETER AORTIC VALVE REPLACEMENT, TRANSFEMORAL;  Surgeon: [name], MD;  Location: [place];  Service: Open Heart Surgery;  Laterality: N/A;  ? TRANSTHORACIC ECHOCARDIOGRAM  03-23-2016   dr Irish Lack  ? EF 55-60%, reduced contribution atrial contraction to ventricular filling, due to increased ventricular diastolic pressure or atrial contratile dysfunction/  bioprosthetic AV w/ mod. regurg. (valve area 1.97cm^2)/ mild dilated ascending aorta/ mild thicken MV normal function annular ring prosthesis/severe LAE & mod. to sev.RAE/ PASP 17mHg/ mild TR/ ventricle septal motion show paradox  ? TRANSURETHRAL RESECTION OF BLADDER TUMOR N/A 02/13/2016  ? Procedure: TRANSURETHRAL RESECTION OF BLADDER TUMOR (TURBT);  Surgeon: [name] MD;  [facility]  Service: Urology;  Laterality: N/A;  ? TRANSURETHRAL RESECTION OF BLADDER TUMOR N/A 09/04/2016  ? Procedure: TRANSURETHRAL RESECTION OF BLADDER TUMOR (TURBT);  Surgeon: [name] MD;  [facility]  Service: Urology;  Laterality: N/A;  ? ? ?There were no vitals filed for this visit. ? ? Subjective Assessment - 08/25/21 1021   ? ? Subjective no falls, doing okay.  I will try to set up appointment next week or two with the trainer at the gym   ? Currently in Pain? No/denies   ? ?  ?  ? ?  ? ? ? ? ? ? ? ? ? ? ? ? ? ? ? ? ? ? ? ? [place]ult PT Treatment/Exercise - 08/25/21 0001   ? ?  ? High Level Balance  ? High Level Balance Activities Negotiating over obstacles   ? High Level Balance Comments walking ball toss  all directions, on airex ball toss, hip wall touches and shoulder blade wall touches, ariex cone toe touches, on airex reaching and then eyes closed   ?  ? Knee/Hip Exercises: Aerobic  ? Recumbent Bike level 4 x 6 minutes   ?  ? Knee/Hip Exercises: Standing  ? Other Standing Knee Exercises 20# farmer's carry two laps each hand   ? ?  ?  ? ?  ? ? ? ? ? ? ? ? ? ? ? ? PT Short Term Goals - 06/06/21 0932   ? ?  ? PT SHORT TERM GOAL #1  ? Title Pt will be I and compliant with initial HEP.   ? Status Achieved   ? ?  ?  ? ?  ? ? ? ? PT Long  Term Goals - 08/25/21 1058   ? ?  ? PT LONG TERM GOAL #1  ? Title Pt will be I and compliant with HEP.   ? Status Partially Met   ?  ? PT LONG TERM GOAL #2  ? Title Pt will improv BERG balance score 47/56 to show improved balance   ? Status Partially Met   ?  ? PT LONG TERM GOAL #3  ? Title decrease TUG time to 12 seconds   ? Status Achieved   ?  ? PT LONG TERM GOAL #4  ? Title increase knee and ankle strength to 4+/5   ? Status Partially Met   ?  ? PT LONG TERM GOAL #5  ? Title right ankle DF to 5 degrees actively   ? Status Partially Met   ? ?  ?  ? ?  ? ? ? ? ? ? ? ? Plan - 08/25/21 1055   ? ? Clinical Impression Statement Patient will try to get with trainer in the next week or two, we have done a lot with his strength and balance, and trying to assure his safety after PT, I foecused treatment today on balance.  he continues to lose balance to the posterior, he will take the step to catch himself but the posterior loss of balance is often especially on the airex   ? PT Frequency 2x / week   ? PT Duration 4 weeks   ? PT Treatment/Interventions ADLs/Self Care Home Management;Gait training;Neuromuscular re-education;Balance training;Therapeutic exercise;Therapeutic activities;Functional mobility training;Stair training;Patient/family education;Manual techniques   ? PT Next Visit Plan will see through April and try to decrease frequency some and work with him and trainer   ? Consulted  and Agree with Plan of Care Patient   ? ?  ?  ? ?  ? ? ?Patient will benefit from skilled therapeutic intervention in order to improve the following deficits and impairments:  Abnormal gait, Decreased coo

## 2021-08-29 ENCOUNTER — Ambulatory Visit: Payer: Medicare Other | Admitting: Physical Therapy

## 2021-08-29 ENCOUNTER — Encounter: Payer: Self-pay | Admitting: Physical Therapy

## 2021-08-29 DIAGNOSIS — R2689 Other abnormalities of gait and mobility: Secondary | ICD-10-CM

## 2021-08-29 DIAGNOSIS — M6281 Muscle weakness (generalized): Secondary | ICD-10-CM | POA: Diagnosis not present

## 2021-08-29 DIAGNOSIS — R262 Difficulty in walking, not elsewhere classified: Secondary | ICD-10-CM

## 2021-08-29 NOTE — Therapy (Signed)
[place] [address] ?[place], [name], [address] ?Phone: [phone]   Fax:  [phone] ? ?Physical Therapy Treatment ? ?Patient Details  ?Name: Eric [name] ?MRN: [id number] ?Date of Birth: 08/26/1929 ?Referring Provider (PT): Frann Rider ? ? ?Encounter Date: 08/29/2021 ? ? PT End of Session - 08/29/21 0925   ? ? Visit Number 25   ? Date for PT Re-Evaluation 09/24/21   ? Authorization Type [employer] Medicare   ? PT Start Time 0845   ? PT Stop Time 0930   ? PT Time Calculation (min) 45 min   ? Activity Tolerance Patient tolerated treatment well   ? Behavior During Therapy [facility] for tasks assessed/performed   ? ?  ?  ? ?  ? ? ?Past Medical History:  ?Diagnosis Date  ? Atrial flutter, paroxysmal ([place])   ? a. dx 04-02-2014, s/p  successful cardioversion 04-06-2014.  ? Chronic anticoagulation   ? on Eliquis--  due to recurrent dvt's and pe's  ? Dyslipidemia   ? History of bladder cancer urologist-  dr [name]  ? 02-13-2016  s/p TURBT per path high grade papillary urothelial carcinoma  ? History of DVT (deep vein thrombosis)   ? 2000-- RLE  ? History of melanoma excision   ? left flank;  07/ 2014 left lower leg and right upper chest  ? History of prostate cancer   ? Gleason 6--  s/p  radical prostatectomy 06/ 2000 in [place]---  no recurrence  ? History of pulmonary embolus (PE)   ? 2000 & 2005  bilateral  ? Hx of valvuloplasty 06/19/2007  ? a. s/p mitral ring annuloplasty, repair ruptured chordae of P2 flail segments of MV  ? S/P aortic valve replacement with bioprosthetic valve   ? 06-19-2007  severe aortic valve stenosis  ? 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 Single vessel coronary artery disease   cardiologist-  dr Irish Lack  ? a. 05/2007 CABG x 1: s/p SVG-OM;  b. 10/2010 Ex MV: EF 72%, inf attenuation w/o ischemia, brief run of PAT with exercise. To  ? Stroke [facility])   ? Thrombocytopenia ([place])   ? Thyroid goiter 2013  ? nodular  ? Wears hearing aid   ? BILATERAL   ? Wears partial dentures   ? UPPER  ? ? ?Past Surgical History:  ?Procedure Laterality Date  ? AORTIC VALVE REPLACEMENT (AVR)/CORONARY ARTERY BYPASS GRAFTING (CABG)  06-19-2007  dr [name]  ? SVG to OM1 ;   AVR w/ #23 Mitralflow pericardial and ligation left atrial appendage;  MV repair w/ #28 Sorin 3-D memo Ring Annuloplasty with repair ruptured chordar of P-2 flail segments  ? CARDIAC CATHETERIZATION  05-12-2007  dr Leonia Reeves  ? single vessel 30% ostial LAD,  80% LCx;  severe to critial AS,  moderate MR,  normal LVF, ef 70%,  normal right heart pressures and cardiac outputs,  mild elevated LV end-diastolic pressure    ? CARDIOVASCULAR STRESS TEST  11-02-2010  dr Irish Lack  ? normal nuclear study w/ no ischemia or infarct/scar/  normal LV function and wall motion , ef 72%  ? CARDIOVERSION N/A 04/06/2014  ? Procedure: CARDIOVERSION;  Surgeon: [name], MD;  Location: [place];  Service: Cardiovascular;  Laterality: N/A;   successful  ? CATARACT EXTRACTION W/ INTRAOCULAR LENS  IMPLANT, BILATERAL  2012  approx  ? CORONARY ARTERY BYPASS GRAFT    ? PROSTATECTOMY    ? RETROPUBIC RADICAL PROSTATECTOMY  06/ 2000  in [place], [place]  ? RIGHT/LEFT HEART CATH AND CORONARY/GRAFT ANGIOGRAPHY N/A 10/29/2018  ? Procedure: RIGHT/LEFT HEART CATH AND CORONARY/GRAFT ANGIOGRAPHY;  Surgeon: [name], MD;  Location: [place] CV LAB;  Service: Cardiovascular;  Laterality: N/A;  ? ROTATOR CUFF REPAIR Left 09/1998  ? TEE WITHOUT CARDIOVERSION N/A 04/06/2014  ? Procedure: TRANSESOPHAGEAL ECHOCARDIOGRAM (TEE);  Surgeon: [name], MD;  Location: [facility] ENDOSCOPY;  Service: Cardiovascular;  Laterality: N/A;  mild focal basa LVH of the septum, ef 50-55%, s/p AV annuloplasty ring, elongated chordae, thickened leaflets, mild to mod. MR, multiple small jets/arotic bioprosthetic valve sits well in position, mild AI/ thickened TV w/ mild reurg./mild LA  ? TEE WITHOUT CARDIOVERSION N/A 10/22/2018  ? Procedure: TRANSESOPHAGEAL  ECHOCARDIOGRAM (TEE);  Surgeon: [name], MD;  Location: [facility] ENDOSCOPY;  Service: Cardiovascular;  Laterality: N/A;  ? TEE WITHOUT CARDIOVERSION N/A 11/25/2018  ? Procedure: TRANSESOPHAGEAL ECHOCARDIOGRAM (TEE);  Surgeon: [name], MD;  Location: [place];  Service: Open Heart Surgery;  Laterality: N/A;  ? TRANSCATHETER AORTIC VALVE REPLACEMENT, TRANSFEMORAL N/A 11/25/2018  ? Procedure: TRANSCATHETER AORTIC VALVE REPLACEMENT, TRANSFEMORAL;  Surgeon: [name], MD;  Location: [place];  Service: Open Heart Surgery;  Laterality: N/A;  ? TRANSTHORACIC ECHOCARDIOGRAM  03-23-2016   dr Irish Lack  ? EF 55-60%, reduced contribution atrial contraction to ventricular filling, due to increased ventricular diastolic pressure or atrial contratile dysfunction/  bioprosthetic AV w/ mod. regurg. (valve area 1.97cm^2)/ mild dilated ascending aorta/ mild thicken MV normal function annular ring prosthesis/severe LAE & mod. to sev.RAE/ PASP 26mHg/ mild TR/ ventricle septal motion show paradox  ? TRANSURETHRAL RESECTION OF BLADDER TUMOR N/A 02/13/2016  ? Procedure: TRANSURETHRAL RESECTION OF BLADDER TUMOR (TURBT);  Surgeon: [name] MD;  [facility]  Service: Urology;  Laterality: N/A;  ? TRANSURETHRAL RESECTION OF BLADDER TUMOR N/A 09/04/2016  ? Procedure: TRANSURETHRAL RESECTION OF BLADDER TUMOR (TURBT);  Surgeon: [name] MD;  [facility]  Service: Urology;  Laterality: N/A;  ? ? ?There were no vitals filed for this visit. ? ? Subjective Assessment - 08/29/21 0848   ? ? Subjective No falls, doing good   ? Currently in Pain? No/denies   ? ?  ?  ? ?  ? ? ? ? ? ? ? ? ? ? ? ? ? ? ? ? ? ? ? ? [place] Treatment/Exercise - 08/29/21 0001   ? ?  ? High Level Balance  ? High Level Balance Activities Negotiating over obstacles   forward & side  ? High Level Balance Comments side step on airex   ?  ? Knee/Hip Exercises: Aerobic  ? Recumbent Bike level 5 x  6 minutes   ?  ? Knee/Hip Exercises: Machines for Strengthening  ? Cybex Knee Extension 10lb 3x10   ? Cybex Knee Flexion 35lb 3x10   ?  ? Knee/Hip Exercises: Standing  ? Forward Step Up 1 set;Both;10 reps;Step Height: 4"   box on airex  ? Walking with Sports Cord all directions 4lb   ? ?  ?  ? ?  ? ? ? ? ? ? ? ? ? ? ? ? PT Short Term Goals - 08/29/21 0926   ? ?  ? PT SHORT TERM GOAL #1  ? Title Pt will be I and compliant with initial HEP.   ? Status Achieved   ? ?  ?  ? ?  ? ? ? ? PT Long Term Goals -  08/29/21 0926   ? ?  ? PT LONG TERM GOAL #1  ? Title Pt will be I and compliant with HEP.   ? Status Partially Met   ? ?  ?  ? ?  ? ? ? ? ? ? ? ? Plan - 08/29/21 0926   ? ? Clinical Impression Statement Continued focus on balance with some LE strength. Pt was most challenged with step ups when stepping back off of box. Pt was able to take step back to catch himself when he did lose balance.  Some instability with side step on and off airex. Cue to complete full ROM with leg extensions.   ? Stability/Clinical Decision Making Stable/Uncomplicated   ? Rehab Potential Good   ? PT Frequency 2x / week   ? PT Duration 4 weeks   ? PT Treatment/Interventions ADLs/Self Care Home Management;Gait training;Neuromuscular re-education;Balance training;Therapeutic exercise;Therapeutic activities;Functional mobility training;Stair training;Patient/family education;Manual techniques   ? PT Next Visit Plan will see through April and try to decrease frequency some and work with him and trainer   ? ?  ?  ? ?  ? ? ?Patient will benefit from skilled therapeutic intervention in order to improve the following deficits and impairments:  Abnormal gait, Decreased coordination, Difficulty walking, Cardiopulmonary status limiting activity, Decreased endurance, Decreased activity tolerance, Decreased balance, Postural dysfunction, Decreased strength, Decreased mobility ? ?Visit Diagnosis: ?Other abnormalities of gait and mobility ? ?Muscle weakness  (generalized) ? ?Difficulty in walking, not elsewhere classified ? ? ? ? ?Problem List ?Patient Active Problem List  ? Diagnosis Date Noted  ? Cerebral thrombosis with cerebral infarction 11/25/2020  ?

## 2021-09-05 ENCOUNTER — Ambulatory Visit: Payer: Medicare Other | Admitting: Physical Therapy

## 2021-09-05 ENCOUNTER — Encounter: Payer: Self-pay | Admitting: Physical Therapy

## 2021-09-05 DIAGNOSIS — R262 Difficulty in walking, not elsewhere classified: Secondary | ICD-10-CM

## 2021-09-05 DIAGNOSIS — R2689 Other abnormalities of gait and mobility: Secondary | ICD-10-CM

## 2021-09-05 DIAGNOSIS — M6281 Muscle weakness (generalized): Secondary | ICD-10-CM | POA: Diagnosis not present

## 2021-09-05 NOTE — Therapy (Signed)
[place] ?[place] [address] ?[place], [name], [address] ?Phone: [phone]   Fax:  [phone] ? ?Physical Therapy Treatment ? ?Patient Details  ?Name: Eric [name] ?MRN: [id number] ?Date of Birth: 09-10-1929 ?Referring Provider (PT): Frann Rider ? ? ?Encounter Date: 09/05/2021 ? ? PT End of Session - 09/05/21 6962   ? ? Visit Number 26   ? Date for PT Re-Evaluation 09/24/21   ? PT Start Time [id number]   ? PT Stop Time 4132   ? PT Time Calculation (min) 43 min   ? Activity Tolerance Patient tolerated treatment well   ? Behavior During Therapy [facility] for tasks assessed/performed   ? ?  ?  ? ?  ? ? ?Past Medical History:  ?Diagnosis Date  ? Atrial flutter, paroxysmal ([place])   ? a. dx 04-02-2014, s/p  successful cardioversion 04-06-2014.  ? Chronic anticoagulation   ? on Eliquis--  due to recurrent dvt's and pe's  ? Dyslipidemia   ? History of bladder cancer urologist-  dr [name]  ? 02-13-2016  s/p TURBT per path high grade papillary urothelial carcinoma  ? History of DVT (deep vein thrombosis)   ? 2000-- RLE  ? History of melanoma excision   ? left flank;  07/ 2014 left lower leg and right upper chest  ? History of prostate cancer   ? Gleason 6--  s/p  radical prostatectomy 06/ 2000 in [place]---  no recurrence  ? History of pulmonary embolus (PE)   ? 2000 & 2005  bilateral  ? Hx of valvuloplasty 06/19/2007  ? a. s/p mitral ring annuloplasty, repair ruptured chordae of P2 flail segments of MV  ? S/P aortic valve replacement with bioprosthetic valve   ? 06-19-2007  severe aortic valve stenosis  ? 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 Single vessel coronary artery disease   cardiologist-  dr Irish Lack  ? a. 05/2007 CABG x 1: s/p SVG-OM;  b. 10/2010 Ex MV: EF 72%, inf attenuation w/o ischemia, brief run of PAT with exercise. To  ? Stroke [facility])   ? Thrombocytopenia ([place])   ? Thyroid goiter 2013  ? nodular  ? Wears hearing aid   ? BILATERAL  ? Wears partial dentures   ? UPPER   ? ? ?Past Surgical History:  ?Procedure Laterality Date  ? AORTIC VALVE REPLACEMENT (AVR)/CORONARY ARTERY BYPASS GRAFTING (CABG)  06-19-2007  dr [name]  ? SVG to OM1 ;   AVR w/ #23 Mitralflow pericardial and ligation left atrial appendage;  MV repair w/ #28 Sorin 3-D memo Ring Annuloplasty with repair ruptured chordar of P-2 flail segments  ? CARDIAC CATHETERIZATION  05-12-2007  dr Leonia Reeves  ? single vessel 30% ostial LAD,  80% LCx;  severe to critial AS,  moderate MR,  normal LVF, ef 70%,  normal right heart pressures and cardiac outputs,  mild elevated LV end-diastolic pressure    ? CARDIOVASCULAR STRESS TEST  11-02-2010  dr Irish Lack  ? normal nuclear study w/ no ischemia or infarct/scar/  normal LV function and wall motion , ef 72%  ? CARDIOVERSION N/A 04/06/2014  ? Procedure: CARDIOVERSION;  Surgeon: [name], MD;  Location: [place];  Service: Cardiovascular;  Laterality: N/A;   successful  ? CATARACT EXTRACTION W/ INTRAOCULAR LENS  IMPLANT, BILATERAL  2012  approx  ? CORONARY ARTERY BYPASS GRAFT    ? PROSTATECTOMY    ? RETROPUBIC RADICAL PROSTATECTOMY  06/ 2000  in [place]  ? RIGHT/LEFT HEART  CATH AND CORONARY/GRAFT ANGIOGRAPHY N/A 10/29/2018  ? Procedure: RIGHT/LEFT HEART CATH AND CORONARY/GRAFT ANGIOGRAPHY;  Surgeon: [name], MD;  Location: [place] CV LAB;  Service: Cardiovascular;  Laterality: N/A;  ? ROTATOR CUFF REPAIR Left 09/1998  ? TEE WITHOUT CARDIOVERSION N/A 04/06/2014  ? Procedure: TRANSESOPHAGEAL ECHOCARDIOGRAM (TEE);  Surgeon: [name], MD;  Location: [facility] ENDOSCOPY;  Service: Cardiovascular;  Laterality: N/A;  mild focal basa LVH of the septum, ef 50-55%, s/p AV annuloplasty ring, elongated chordae, thickened leaflets, mild to mod. MR, multiple small jets/arotic bioprosthetic valve sits well in position, mild AI/ thickened TV w/ mild reurg./mild LA  ? TEE WITHOUT CARDIOVERSION N/A 10/22/2018  ? Procedure: TRANSESOPHAGEAL ECHOCARDIOGRAM (TEE);  Surgeon: [name], MD;  Location: [facility] ENDOSCOPY;  Service: Cardiovascular;  Laterality: N/A;  ? TEE WITHOUT CARDIOVERSION N/A 11/25/2018  ? Procedure: TRANSESOPHAGEAL ECHOCARDIOGRAM (TEE);  Surgeon: [name], MD;  Location: [place];  Service: Open Heart Surgery;  Laterality: N/A;  ? TRANSCATHETER AORTIC VALVE REPLACEMENT, TRANSFEMORAL N/A 11/25/2018  ? Procedure: TRANSCATHETER AORTIC VALVE REPLACEMENT, TRANSFEMORAL;  Surgeon: [name], MD;  Location: [place];  Service: Open Heart Surgery;  Laterality: N/A;  ? TRANSTHORACIC ECHOCARDIOGRAM  03-23-2016   dr Irish Lack  ? EF 55-60%, reduced contribution atrial contraction to ventricular filling, due to increased ventricular diastolic pressure or atrial contratile dysfunction/  bioprosthetic AV w/ mod. regurg. (valve area 1.97cm^2)/ mild dilated ascending aorta/ mild thicken MV normal function annular ring prosthesis/severe LAE & mod. to sev.RAE/ PASP 58mHg/ mild TR/ ventricle septal motion show paradox  ? TRANSURETHRAL RESECTION OF BLADDER TUMOR N/A 02/13/2016  ? Procedure: TRANSURETHRAL RESECTION OF BLADDER TUMOR (TURBT);  Surgeon: [name] MD;  [facility]  Service: Urology;  Laterality: N/A;  ? TRANSURETHRAL RESECTION OF BLADDER TUMOR N/A 09/04/2016  ? Procedure: TRANSURETHRAL RESECTION OF BLADDER TUMOR (TURBT);  Surgeon: [name] MD;  [facility]  Service: Urology;  Laterality: N/A;  ? ? ?There were no vitals filed for this visit. ? ? Subjective Assessment - 09/05/21 0851   ? ? Subjective Good, no falls.   ? Currently in Pain? No/denies   ? ?  ?  ? ?  ? ? ? ? ? ? ? ? ? ? ? ? ? ? ? ? ? ? ? ? OStartAdult PT Treatment/Exercise - 09/05/21 0001   ? ?  ? High Level Balance  ? High Level Balance Comments side step into 6 in box   ?  ? Knee/Hip Exercises: Aerobic  ? Nustep L5 x 6 min   ?  ? Knee/Hip Exercises: Machines for Strengthening  ? Cybex Knee Extension 10lb 3x10   ? Cybex Knee Flexion 45lb 2x10    ? Cybex Leg Press 70# 3x10, calf press 50# 2x10   ? Other Machine 35# rows, lats 2x12   ?  ? Knee/Hip Exercises: Seated  ? Sit to Sand 2 sets;10 reps;5 reps   on airex yellow ball chest press  ? ?  ?  ? ?  ? ? ? ? ? ? ? ? ? ? ? ? PT Short Term Goals - 08/29/21 0926   ? ?  ? PT SHORT TERM GOAL #1  ? Title Pt will be I and compliant with initial HEP.   ? Status Achieved   ? ?  ?  ? ?  ? ? ? ? PT Long Term Goals - 09/05/21 0922   ? ?  ?  PT LONG TERM GOAL #1  ? Title Pt will be I and compliant with HEP.   ? Status Achieved   ?  ? PT LONG TERM GOAL #2  ? Title Pt will improv BERG balance score 47/56 to show improved balance   ? Status Partially Met   ? ?  ?  ? ?  ? ? ? ? ? ? ? ? Plan - 09/05/21 0923   ? ? Clinical Impression Statement no issue upon entering clinic. postural cues required with seated rows. Instability noted with side step into box requiring CGA to min guard to complete interventions. Increase resistance tolerated with seated hamstring curls. So LE and core weakness noted with sit to stands on airex.   ? Stability/Clinical Decision Making Stable/Uncomplicated   ? Rehab Potential Good   ? PT Frequency 2x / week   ? PT Duration 4 weeks   ? PT Treatment/Interventions ADLs/Self Care Home Management;Gait training;Neuromuscular re-education;Balance training;Therapeutic exercise;Therapeutic activities;Functional mobility training;Stair training;Patient/family education;Manual techniques   ? PT Next Visit Plan will see through April and try to decrease frequency some and work with him and trainer   ? ?  ?  ? ?  ? ? ?Patient will benefit from skilled therapeutic intervention in order to improve the following deficits and impairments:  Abnormal gait, Decreased coordination, Difficulty walking, Cardiopulmonary status limiting activity, Decreased endurance, Decreased activity tolerance, Decreased balance, Postural dysfunction, Decreased strength, Decreased mobility ? ?Visit Diagnosis: ?Other abnormalities of gait and  mobility ? ?Difficulty in walking, not elsewhere classified ? ?Muscle weakness (generalized) ? ? ? ? ?Problem List ?Patient Active Problem List  ? Diagnosis Date Noted  ? Cerebral thrombosis with cer

## 2021-09-07 ENCOUNTER — Encounter: Payer: Self-pay | Admitting: Physical Therapy

## 2021-09-07 ENCOUNTER — Ambulatory Visit: Payer: Medicare Other | Admitting: Physical Therapy

## 2021-09-07 DIAGNOSIS — R262 Difficulty in walking, not elsewhere classified: Secondary | ICD-10-CM

## 2021-09-07 DIAGNOSIS — M6281 Muscle weakness (generalized): Secondary | ICD-10-CM | POA: Diagnosis not present

## 2021-09-07 DIAGNOSIS — R2689 Other abnormalities of gait and mobility: Secondary | ICD-10-CM

## 2021-09-07 NOTE — Therapy (Signed)
[place] ?[address] ?[place], [name], [address] ?Phone: [phone]   Fax:  [phone] ? ?Physical Therapy Treatment ? ?Patient Details  ?Name: Eric [name] ?MRN: [id number] ?Date of Birth: 1929-11-13 ?Referring Provider (PT): Frann Rider ? ? ?Encounter Date: 09/07/2021 ? ? PT End of Session - 09/07/21 2542   ? ? Visit Number 27   ? Date for PT Re-Evaluation 09/24/21   ? Authorization Type [employer] Medicare   ? PT Start Time 0845   ? PT Stop Time 0930   ? PT Time Calculation (min) 45 min   ? Activity Tolerance Patient tolerated treatment well   ? Behavior During Therapy [facility] for tasks assessed/performed   ? ?  ?  ? ?  ? ? ?Past Medical History:  ?Diagnosis Date  ? Atrial flutter, paroxysmal ([place])   ? a. dx 04-02-2014, s/p  successful cardioversion 04-06-2014.  ? Chronic anticoagulation   ? on Eliquis--  due to recurrent dvt's and pe's  ? Dyslipidemia   ? History of bladder cancer urologist-  dr [name]  ? 02-13-2016  s/p TURBT per path high grade papillary urothelial carcinoma  ? History of DVT (deep vein thrombosis)   ? 2000-- RLE  ? History of melanoma excision   ? left flank;  07/ 2014 left lower leg and right upper chest  ? History of prostate cancer   ? Gleason 6--  s/p  radical prostatectomy 06/ 2000 in [place]---  no recurrence  ? History of pulmonary embolus (PE)   ? 2000 & 2005  bilateral  ? Hx of valvuloplasty 06/19/2007  ? a. s/p mitral ring annuloplasty, repair ruptured chordae of P2 flail segments of MV  ? S/P aortic valve replacement with bioprosthetic valve   ? 06-19-2007  severe aortic valve stenosis  ? 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 Single vessel coronary artery disease   cardiologist-  dr Irish Lack  ? a. 05/2007 CABG x 1: s/p SVG-OM;  b. 10/2010 Ex MV: EF 72%, inf attenuation w/o ischemia, brief run of PAT with exercise. To  ? Stroke [facility])   ? Thrombocytopenia ([place])   ? Thyroid goiter 2013  ? nodular  ? Wears hearing aid   ? BILATERAL   ? Wears partial dentures   ? UPPER  ? ? ?Past Surgical History:  ?Procedure Laterality Date  ? AORTIC VALVE REPLACEMENT (AVR)/CORONARY ARTERY BYPASS GRAFTING (CABG)  06-19-2007  dr [name]  ? SVG to OM1 ;   AVR w/ #23 Mitralflow pericardial and ligation left atrial appendage;  MV repair w/ #28 Sorin 3-D memo Ring Annuloplasty with repair ruptured chordar of P-2 flail segments  ? CARDIAC CATHETERIZATION  05-12-2007  dr Leonia Reeves  ? single vessel 30% ostial LAD,  80% LCx;  severe to critial AS,  moderate MR,  normal LVF, ef 70%,  normal right heart pressures and cardiac outputs,  mild elevated LV end-diastolic pressure    ? CARDIOVASCULAR STRESS TEST  11-02-2010  dr Irish Lack  ? normal nuclear study w/ no ischemia or infarct/scar/  normal LV function and wall motion , ef 72%  ? CARDIOVERSION N/A 04/06/2014  ? Procedure: CARDIOVERSION;  Surgeon: [name], MD;  Location: [place];  Service: Cardiovascular;  Laterality: N/A;   successful  ? CATARACT EXTRACTION W/ INTRAOCULAR LENS  IMPLANT, BILATERAL  2012  approx  ? CORONARY ARTERY BYPASS GRAFT    ? PROSTATECTOMY    ? RETROPUBIC RADICAL PROSTATECTOMY  06/ 2000  in [place], [place]  ? RIGHT/LEFT HEART CATH AND CORONARY/GRAFT ANGIOGRAPHY N/A 10/29/2018  ? Procedure: RIGHT/LEFT HEART CATH AND CORONARY/GRAFT ANGIOGRAPHY;  Surgeon: [name], MD;  Location: [place] [facility];  Service: Cardiovascular;  Laterality: N/A;  ? ROTATOR CUFF REPAIR Left 09/1998  ? TEE WITHOUT CARDIOVERSION N/A 04/06/2014  ? Procedure: TRANSESOPHAGEAL ECHOCARDIOGRAM (TEE);  Surgeon: [name], MD;  Location: [facility] ENDOSCOPY;  Service: Cardiovascular;  Laterality: N/A;  mild focal basa LVH of the septum, ef 50-55%, s/p AV annuloplasty ring, elongated chordae, thickened leaflets, mild to mod. MR, multiple small jets/arotic bioprosthetic valve sits well in position, mild AI/ thickened TV w/ mild reurg./mild LA  ? TEE WITHOUT CARDIOVERSION N/A 10/22/2018  ? Procedure: TRANSESOPHAGEAL  ECHOCARDIOGRAM (TEE);  Surgeon: [name], MD;  Location: [facility] ENDOSCOPY;  Service: Cardiovascular;  Laterality: N/A;  ? TEE WITHOUT CARDIOVERSION N/A 11/25/2018  ? Procedure: TRANSESOPHAGEAL ECHOCARDIOGRAM (TEE);  Surgeon: [name], MD;  Location: [place];  Service: Open Heart Surgery;  Laterality: N/A;  ? TRANSCATHETER AORTIC VALVE REPLACEMENT, TRANSFEMORAL N/A 11/25/2018  ? Procedure: TRANSCATHETER AORTIC VALVE REPLACEMENT, TRANSFEMORAL;  Surgeon: [name], MD;  Location: [place];  Service: Open Heart Surgery;  Laterality: N/A;  ? TRANSTHORACIC ECHOCARDIOGRAM  03-23-2016   dr Irish Lack  ? EF 55-60%, reduced contribution atrial contraction to ventricular filling, due to increased ventricular diastolic pressure or atrial contratile dysfunction/  bioprosthetic AV w/ mod. regurg. (valve area 1.97cm^2)/ mild dilated ascending aorta/ mild thicken MV normal function annular ring prosthesis/severe LAE & mod. to sev.RAE/ PASP 31mHg/ mild TR/ ventricle septal motion show paradox  ? TRANSURETHRAL RESECTION OF BLADDER TUMOR N/A 02/13/2016  ? Procedure: TRANSURETHRAL RESECTION OF BLADDER TUMOR (TURBT);  Surgeon: [name] MD;  [facility]  Service: Urology;  Laterality: N/A;  ? TRANSURETHRAL RESECTION OF BLADDER TUMOR N/A 09/04/2016  ? Procedure: TRANSURETHRAL RESECTION OF BLADDER TUMOR (TURBT);  Surgeon: [name] MD;  [facility]  Service: Urology;  Laterality: N/A;  ? ? ?There were no vitals filed for this visit. ? ? Subjective Assessment - 09/07/21 0848   ? ? Subjective "Good"   ? Currently in Pain? No/denies   ? ?  ?  ? ?  ? ? ? ? ? ? ? ? ? ? ? ? ? ? ? ? ? ? ? ? [place] Treatment/Exercise - 09/07/21 0001   ? ?  ? High Level Balance  ? High Level Balance Comments side step foam roll between airex 2x7   ?  ? Knee/Hip Exercises: Aerobic  ? Nustep L5 x 6 min   ?  ? Knee/Hip Exercises: Machines for Strengthening  ? Cybex Leg Press 70#  3x10   ? Other Machine 35# rows, lats 2x15   ?  ? Knee/Hip Exercises: Standing  ? Lateral Step Up Both;1 set;10 reps;Hand Hold: 0;Step Height: 4"   box on airex  ? Forward Step Up Both;1 set;10 reps;Hand Hold: 0;Step Height: 6"   from airex pas  ?  ? Knee/Hip Exercises: Seated  ? Sit to Sand 1 set;15 reps;without UE support   holding yellow ball  ? ?  ?  ? ?  ? ? ? ? ? ? ? ? ? ? ? ? PT Short Term Goals - 08/29/21 0926   ? ?  ? PT SHORT TERM GOAL #1  ? Title Pt will be I and compliant with initial HEP.   ? Status Achieved   ? ?  ?  ? ?  ? ? ? ?  PT Long Term Goals - 09/05/21 0922   ? ?  ? PT LONG TERM GOAL #1  ? Title Pt will be I and compliant with HEP.   ? Status Achieved   ?  ? PT LONG TERM GOAL #2  ? Title Pt will improv BERG balance score 47/56 to show improved balance   ? Status Partially Met   ? ?  ?  ? ?  ? ? ? ? ? ? ? ? Plan - 09/07/21 0923   ? ? Clinical Impression Statement Pt able to complete all interventions. Non compliant surfaces continues to really challenge pt requiring CGA to min assist to maintain balance. Cues for LE clearance need with step ups form airex on 6 in box. No reports of increase pain during session.   ? Stability/Clinical Decision Making Stable/Uncomplicated   ? Rehab Potential Good   ? PT Frequency 2x / week   ? PT Duration 4 weeks   ? PT Next Visit Plan will see through April and try to decrease frequency some and work with him and trainer   ? ?  ?  ? ?  ? ? ?Patient will benefit from skilled therapeutic intervention in order to improve the following deficits and impairments:  Abnormal gait, Decreased coordination, Difficulty walking, Cardiopulmonary status limiting activity, Decreased endurance, Decreased activity tolerance, Decreased balance, Postural dysfunction, Decreased strength, Decreased mobility ? ?Visit Diagnosis: ?Other abnormalities of gait and mobility ? ?Difficulty in walking, not elsewhere classified ? ?Muscle weakness (generalized) ? ? ? ? ?Problem List ?Patient  Active Problem List  ? Diagnosis Date Noted  ? Cerebral thrombosis with cerebral infarction 11/25/2020  ? Acute right-sided weakness 11/24/2020  ? History of mitral valve repair 11/26/2018  ? Prosthetic valve

## 2021-09-12 ENCOUNTER — Ambulatory Visit: Payer: Medicare Other | Admitting: Physical Therapy

## 2021-09-12 ENCOUNTER — Encounter: Payer: Self-pay | Admitting: Physical Therapy

## 2021-09-12 DIAGNOSIS — R262 Difficulty in walking, not elsewhere classified: Secondary | ICD-10-CM

## 2021-09-12 DIAGNOSIS — M6281 Muscle weakness (generalized): Secondary | ICD-10-CM | POA: Diagnosis not present

## 2021-09-12 DIAGNOSIS — R2689 Other abnormalities of gait and mobility: Secondary | ICD-10-CM

## 2021-09-12 NOTE — Therapy (Signed)
[place] ?[address] ?[address] ?[place], [name], [address] ?Phone: [phone]   Fax:  [phone] ? ?Physical Therapy Treatment ? ?Patient Details  ?Name: Eric [name] ?MRN: [id number] ?Date of Birth: 1930/01/08 ?Referring Provider (PT): Frann Rider ? ? ?Encounter Date: 09/12/2021 ? ? PT End of Session - 09/12/21 1012   ? ? Visit Number 28   ? Date for PT Re-Evaluation 09/24/21   ? PT Start Time 0930   ? PT Stop Time 1015   ? PT Time Calculation (min) 45 min   ? Activity Tolerance Patient tolerated treatment well   ? Behavior During Therapy [facility] for tasks assessed/performed   ? ?  ?  ? ?  ? ? ?Past Medical History:  ?Diagnosis Date  ? Atrial flutter, paroxysmal ([place])   ? a. dx 04-02-2014, s/p  successful cardioversion 04-06-2014.  ? Chronic anticoagulation   ? on Eliquis--  due to recurrent dvt's and pe's  ? Dyslipidemia   ? History of bladder cancer urologist-  dr [name]  ? 02-13-2016  s/p TURBT per path high grade papillary urothelial carcinoma  ? History of DVT (deep vein thrombosis)   ? 2000-- RLE  ? History of melanoma excision   ? left flank;  07/ 2014 left lower leg and right upper chest  ? History of prostate cancer   ? Gleason 6--  s/p  radical prostatectomy 06/ 2000 in [place]---  no recurrence  ? History of pulmonary embolus (PE)   ? 2000 & 2005  bilateral  ? Hx of valvuloplasty 06/19/2007  ? a. s/p mitral ring annuloplasty, repair ruptured chordae of P2 flail segments of MV  ? S/P aortic valve replacement with bioprosthetic valve   ? 06-19-2007  severe aortic valve stenosis  ? 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 Single vessel coronary artery disease   cardiologist-  dr Irish Lack  ? a. 05/2007 CABG x 1: s/p SVG-OM;  b. 10/2010 Ex MV: EF 72%, inf attenuation w/o ischemia, brief run of PAT with exercise. To  ? Stroke [facility])   ? Thrombocytopenia ([place])   ? Thyroid goiter 2013  ? nodular  ? Wears hearing aid   ? BILATERAL  ? Wears partial dentures   ? UPPER   ? ? ?Past Surgical History:  ?Procedure Laterality Date  ? AORTIC VALVE REPLACEMENT (AVR)/CORONARY ARTERY BYPASS GRAFTING (CABG)  06-19-2007  dr [name]  ? SVG to OM1 ;   AVR w/ #23 Mitralflow pericardial and ligation left atrial appendage;  MV repair w/ #28 Sorin 3-D memo Ring Annuloplasty with repair ruptured chordar of P-2 flail segments  ? CARDIAC CATHETERIZATION  05-12-2007  dr Leonia Reeves  ? single vessel 30% ostial LAD,  80% LCx;  severe to critial AS,  moderate MR,  normal LVF, ef 70%,  normal right heart pressures and cardiac outputs,  mild elevated LV end-diastolic pressure    ? CARDIOVASCULAR STRESS TEST  11-02-2010  dr Irish Lack  ? normal nuclear study w/ no ischemia or infarct/scar/  normal LV function and wall motion , ef 72%  ? CARDIOVERSION N/A 04/06/2014  ? Procedure: CARDIOVERSION;  Surgeon: [name], MD;  Location: [place];  Service: Cardiovascular;  Laterality: N/A;   successful  ? CATARACT EXTRACTION W/ INTRAOCULAR LENS  IMPLANT, BILATERAL  2012  approx  ? CORONARY ARTERY BYPASS GRAFT    ? PROSTATECTOMY    ? RETROPUBIC RADICAL PROSTATECTOMY  06/ 2000  in [place]  ? RIGHT/LEFT HEART  CATH AND CORONARY/GRAFT ANGIOGRAPHY N/A 10/29/2018  ? Procedure: RIGHT/LEFT HEART CATH AND CORONARY/GRAFT ANGIOGRAPHY;  Surgeon: [name], MD;  Location: [place] [facility];  Service: Cardiovascular;  Laterality: N/A;  ? ROTATOR CUFF REPAIR Left 09/1998  ? TEE WITHOUT CARDIOVERSION N/A 04/06/2014  ? Procedure: TRANSESOPHAGEAL ECHOCARDIOGRAM (TEE);  Surgeon: [name], MD;  Location: [facility] ENDOSCOPY;  Service: Cardiovascular;  Laterality: N/A;  mild focal basa LVH of the septum, ef 50-55%, s/p AV annuloplasty ring, elongated chordae, thickened leaflets, mild to mod. MR, multiple small jets/arotic bioprosthetic valve sits well in position, mild AI/ thickened TV w/ mild reurg./mild LA  ? TEE WITHOUT CARDIOVERSION N/A 10/22/2018  ? Procedure: TRANSESOPHAGEAL ECHOCARDIOGRAM (TEE);  Surgeon: [name], MD;  Location: [facility] ENDOSCOPY;  Service: Cardiovascular;  Laterality: N/A;  ? TEE WITHOUT CARDIOVERSION N/A 11/25/2018  ? Procedure: TRANSESOPHAGEAL ECHOCARDIOGRAM (TEE);  Surgeon: [name], MD;  Location: [place];  Service: Open Heart Surgery;  Laterality: N/A;  ? TRANSCATHETER AORTIC VALVE REPLACEMENT, TRANSFEMORAL N/A 11/25/2018  ? Procedure: TRANSCATHETER AORTIC VALVE REPLACEMENT, TRANSFEMORAL;  Surgeon: [name], MD;  Location: [place];  Service: Open Heart Surgery;  Laterality: N/A;  ? TRANSTHORACIC ECHOCARDIOGRAM  03-23-2016   dr Irish Lack  ? EF 55-60%, reduced contribution atrial contraction to ventricular filling, due to increased ventricular diastolic pressure or atrial contratile dysfunction/  bioprosthetic AV w/ mod. regurg. (valve area 1.97cm^2)/ mild dilated ascending aorta/ mild thicken MV normal function annular ring prosthesis/severe LAE & mod. to sev.RAE/ PASP 26mHg/ mild TR/ ventricle septal motion show paradox  ? TRANSURETHRAL RESECTION OF BLADDER TUMOR N/A 02/13/2016  ? Procedure: TRANSURETHRAL RESECTION OF BLADDER TUMOR (TURBT);  Surgeon: [name] MD;  [facility]  Service: Urology;  Laterality: N/A;  ? TRANSURETHRAL RESECTION OF BLADDER TUMOR N/A 09/04/2016  ? Procedure: TRANSURETHRAL RESECTION OF BLADDER TUMOR (TURBT);  Surgeon: [name] MD;  [facility]  Service: Urology;  Laterality: N/A;  ? ? ?There were no vitals filed for this visit. ? ? Subjective Assessment - 09/12/21 0933   ? ? Subjective Doing well   ? Currently in Pain? No/denies   ? ?  ?  ? ?  ? ? ? ? ? ? ? ? ? ? ? ? ? ? ? ? ? ? ? ? [facility] PT Treatment/Exercise - 09/12/21 0001   ? ?  ? High Level Balance  ? High Level Balance Activities Negotiating over obstacles   forward and side steps over WaTE bar  ? High Level Balance Comments side step into 6 in box   ?  ? Knee/Hip Exercises: Aerobic  ? Nustep L5 x 6 min   ?  ? Knee/Hip Exercises:  Machines for Strengthening  ? Cybex Knee Extension 15lb 3x10   ? Cybex Knee Flexion 45lb 3x10   ? Other Machine 45# rows, lats 2x10   ?  ? Knee/Hip Exercises: Standing  ? Forward Step Up Both;2 sets;5 reps;Hand Hold: 0;Step Height: 8"   ? Walking with Sports Cord 30lb side step over half foam roll x10   ? ?  ?  ? ?  ? ? ? ? ? ? ? ? ? ? ? ? PT Short Term Goals - 08/29/21 0926   ? ?  ? PT SHORT TERM GOAL #1  ? Title Pt will be I and compliant with initial HEP.   ? Status Achieved   ? ?  ?  ? ?  ? ? ? ?  PT Long Term Goals - 09/12/21 1012   ? ?  ? PT LONG TERM GOAL #1  ? Title Pt will be I and compliant with HEP.   ? Status Achieved   ? ?  ?  ? ?  ? ? ? ? ? ? ? ? Plan - 09/12/21 1013   ? ? Clinical Impression Statement No issue reported upon entering clinic. Progress with total body strength and balance. Difficulty with resisted side steps over foam roll and with side step into 6 inch box. No reports of increase pain. Cue to come to full standing with step ups.   ? Stability/Clinical Decision Making Stable/Uncomplicated   ? Rehab Potential Good   ? PT Frequency 2x / week   ? PT Duration 4 weeks   ? PT Treatment/Interventions ADLs/Self Care Home Management;Gait training;Neuromuscular re-education;Balance training;Therapeutic exercise;Therapeutic activities;Functional mobility training;Stair training;Patient/family education;Manual techniques   ? ?  ?  ? ?  ? ? ?Patient will benefit from skilled therapeutic intervention in order to improve the following deficits and impairments:  Abnormal gait, Decreased coordination, Difficulty walking, Cardiopulmonary status limiting activity, Decreased endurance, Decreased activity tolerance, Decreased balance, Postural dysfunction, Decreased strength, Decreased mobility ? ?Visit Diagnosis: ?Other abnormalities of gait and mobility ? ?Difficulty in walking, not elsewhere classified ? ? ? ? ?Problem List ?Patient Active Problem List  ? Diagnosis Date Noted  ? Cerebral thrombosis with  cerebral infarction 11/25/2020  ? Acute right-sided weakness 11/24/2020  ? History of mitral valve repair 11/26/2018  ? Prosthetic valve dysfunction 11/25/2018  ? Ectatic abdominal aorta ([place]) 11/25/2018

## 2021-09-14 ENCOUNTER — Ambulatory Visit: Payer: Medicare Other | Admitting: Physical Therapy

## 2021-09-14 ENCOUNTER — Encounter: Payer: Self-pay | Admitting: Physical Therapy

## 2021-09-14 DIAGNOSIS — R262 Difficulty in walking, not elsewhere classified: Secondary | ICD-10-CM

## 2021-09-14 DIAGNOSIS — R2689 Other abnormalities of gait and mobility: Secondary | ICD-10-CM

## 2021-09-14 DIAGNOSIS — M6281 Muscle weakness (generalized): Secondary | ICD-10-CM | POA: Diagnosis not present

## 2021-09-14 NOTE — Therapy (Signed)
[facility] ?[place], [name], [address] ?Phone: [phone]   Fax:  [phone] ? ?Physical Therapy Treatment ? ?Patient Details  ?Name: Eric [name] ?MRN: [id number] ?Date of Birth: 1929/10/06 ?Referring Provider (PT): Frann Rider ? ? ?Encounter Date: 09/14/2021 ? ? PT End of Session - 09/14/21 1015   ? ? Visit Number [id number]   ? Date for PT Re-Evaluation 09/24/21   ? Authorization Type [employer] Medicare   ? PT Start Time 0930   ? PT Stop Time 1015   ? PT Time Calculation (min) 45 min   ? Activity Tolerance Patient tolerated treatment well   ? Behavior During Therapy [facility] for tasks assessed/performed   ? ?  ?  ? ?  ? ? ?Past Medical History:  ?Diagnosis Date  ? Atrial flutter, paroxysmal ([place])   ? a. dx 04-02-2014, s/p  successful cardioversion 04-06-2014.  ? Chronic anticoagulation   ? on Eliquis--  due to recurrent dvt's and pe's  ? Dyslipidemia   ? History of bladder cancer urologist-  dr [name]  ? 02-13-2016  s/p TURBT per path high grade papillary urothelial carcinoma  ? History of DVT (deep vein thrombosis)   ? 2000-- RLE  ? History of melanoma excision   ? left flank;  07/ 2014 left lower leg and right upper chest  ? History of prostate cancer   ? Gleason 6--  s/p  radical prostatectomy 06/ 2000 in [place]---  no recurrence  ? History of pulmonary embolus (PE)   ? 2000 & 2005  bilateral  ? Hx of valvuloplasty 06/19/2007  ? a. s/p mitral ring annuloplasty, repair ruptured chordae of P2 flail segments of MV  ? S/P aortic valve replacement with bioprosthetic valve   ? 06-19-2007  severe aortic valve stenosis  ? 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 Single vessel coronary artery disease   cardiologist-  dr Irish Lack  ? a. 05/2007 CABG x 1: s/p SVG-OM;  b. 10/2010 Ex MV: EF 72%, inf attenuation w/o ischemia, brief run of PAT with exercise. To  ? Stroke [facility])   ? Thrombocytopenia ([place])   ? Thyroid goiter 2013  ? nodular  ? Wears hearing aid   ? BILATERAL   ? Wears partial dentures   ? UPPER  ? ? ?Past Surgical History:  ?Procedure Laterality Date  ? AORTIC VALVE REPLACEMENT (AVR)/CORONARY ARTERY BYPASS GRAFTING (CABG)  06-19-2007  dr [name]  ? SVG to OM1 ;   AVR w/ #23 Mitralflow pericardial and ligation left atrial appendage;  MV repair w/ #28 Sorin 3-D memo Ring Annuloplasty with repair ruptured chordar of P-2 flail segments  ? CARDIAC CATHETERIZATION  05-12-2007  dr Leonia Reeves  ? single vessel 30% ostial LAD,  80% LCx;  severe to critial AS,  moderate MR,  normal LVF, ef 70%,  normal right heart pressures and cardiac outputs,  mild elevated LV end-diastolic pressure    ? CARDIOVASCULAR STRESS TEST  11-02-2010  dr Irish Lack  ? normal nuclear study w/ no ischemia or infarct/scar/  normal LV function and wall motion , ef 72%  ? CARDIOVERSION N/A 04/06/2014  ? Procedure: CARDIOVERSION;  Surgeon: [name], MD;  Location: [place];  Service: Cardiovascular;  Laterality: N/A;   successful  ? CATARACT EXTRACTION W/ INTRAOCULAR LENS  IMPLANT, BILATERAL  2012  approx  ? CORONARY ARTERY BYPASS GRAFT    ? PROSTATECTOMY    ? RETROPUBIC RADICAL PROSTATECTOMY  06/ 2000  in [place]  ? RIGHT/LEFT HEART CATH AND CORONARY/GRAFT ANGIOGRAPHY N/A 10/29/2018  ? Procedure: RIGHT/LEFT HEART CATH AND CORONARY/GRAFT ANGIOGRAPHY;  Surgeon: [name], MD;  Location: [place] [facility];  Service: Cardiovascular;  Laterality: N/A;  ? ROTATOR CUFF REPAIR Left 09/1998  ? TEE WITHOUT CARDIOVERSION N/A 04/06/2014  ? Procedure: TRANSESOPHAGEAL ECHOCARDIOGRAM (TEE);  Surgeon: [name], MD;  Location: [facility] ENDOSCOPY;  Service: Cardiovascular;  Laterality: N/A;  mild focal basa LVH of the septum, ef 50-55%, s/p AV annuloplasty ring, elongated chordae, thickened leaflets, mild to mod. MR, multiple small jets/arotic bioprosthetic valve sits well in position, mild AI/ thickened TV w/ mild reurg./mild LA  ? TEE WITHOUT CARDIOVERSION N/A 10/22/2018  ? Procedure: TRANSESOPHAGEAL  ECHOCARDIOGRAM (TEE);  Surgeon: [name], MD;  Location: [facility] ENDOSCOPY;  Service: Cardiovascular;  Laterality: N/A;  ? TEE WITHOUT CARDIOVERSION N/A 11/25/2018  ? Procedure: TRANSESOPHAGEAL ECHOCARDIOGRAM (TEE);  Surgeon: [name], MD;  Location: [place];  Service: Open Heart Surgery;  Laterality: N/A;  ? TRANSCATHETER AORTIC VALVE REPLACEMENT, TRANSFEMORAL N/A 11/25/2018  ? Procedure: TRANSCATHETER AORTIC VALVE REPLACEMENT, TRANSFEMORAL;  Surgeon: [name], MD;  Location: [place];  Service: Open Heart Surgery;  Laterality: N/A;  ? TRANSTHORACIC ECHOCARDIOGRAM  03-23-2016   dr Irish Lack  ? EF 55-60%, reduced contribution atrial contraction to ventricular filling, due to increased ventricular diastolic pressure or atrial contratile dysfunction/  bioprosthetic AV w/ mod. regurg. (valve area 1.97cm^2)/ mild dilated ascending aorta/ mild thicken MV normal function annular ring prosthesis/severe LAE & mod. to sev.RAE/ PASP 54mHg/ mild TR/ ventricle septal motion show paradox  ? TRANSURETHRAL RESECTION OF BLADDER TUMOR N/A 02/13/2016  ? Procedure: TRANSURETHRAL RESECTION OF BLADDER TUMOR (TURBT);  Surgeon: [name] MD;  [facility]  Service: Urology;  Laterality: N/A;  ? TRANSURETHRAL RESECTION OF BLADDER TUMOR N/A 09/04/2016  ? Procedure: TRANSURETHRAL RESECTION OF BLADDER TUMOR (TURBT);  Surgeon: [name] MD;  [facility]  Service: Urology;  Laterality: N/A;  ? ? ?There were no vitals filed for this visit. ? ? Subjective Assessment - 09/14/21 0929   ? ? Subjective Good, no issues   ? Currently in Pain? No/denies   ? ?  ?  ? ?  ? ? ? ? ? ? ? ? ? ? ? ? ? ? ? ? ? ? ? ? O[place]ixAdult PT Treatment/Exercise - 09/14/21 0001   ? ?  ? High Level Balance  ? High Level Balance Comments side step on balance beam, backwards walking on balance beam. Exyes closed on balnce beam.   ?  ? Knee/Hip Exercises: Aerobic  ? Nustep L5 x 6 min   ?  ? Knee/Hip  Exercises: Machines for Strengthening  ? Cybex Knee Extension 15lb 3x10   ? Cybex Knee Flexion 45lb 3x10   ? Cybex Leg Press 70# 3x10   ? Other Machine 45# rows, lats 2x10   ?  ? Knee/Hip Exercises: Standing  ? Lateral Step Up Both;1 set;10 reps;Hand Hold: 0;Step Height: 4"   box on airex  ? Forward Step Up Both;1 set;10 reps;Hand Hold: 0;Step Height: 4"   box on airex  ?  ? Knee/Hip Exercises: Seated  ? Sit to Sand 2 sets;10 reps;without UE support   LE on airex with yellow ball chest press  ? ?  ?  ? ?  ? ? ? ? ? ? ? ? ? ? ? ? PT Short Term Goals - 09/14/21  1016   ? ?  ? PT SHORT TERM GOAL #1  ? Title Pt will be I and compliant with initial HEP.   ? Status Achieved   ? ?  ?  ? ?  ? ? ? ? PT Long Term Goals - 09/14/21 1016   ? ?  ? PT LONG TERM GOAL #1  ? Title Pt will be I and compliant with HEP.   ? Status Achieved   ?  ? PT LONG TERM GOAL #2  ? Title Pt will improv BERG balance score 47/56 to show improved balance   ? Status Partially Met   ?  ? PT LONG TERM GOAL #3  ? Title decrease TUG time to 12 seconds   ? Status Achieved   ?  ? PT LONG TERM GOAL #4  ? Title increase knee and ankle strength to 4+/5   ? Status Partially Met   ?  ? PT LONG TERM GOAL #5  ? Title right ankle DF to 5 degrees actively   ? Status Partially Met   ? ?  ?  ? ?  ? ? ? ? ? ? ? ? Plan - 09/14/21 1016   ? ? Clinical Impression Statement Pt enters feeling well reporting plans to attend the gym next week. He did well with all machine level interventions. Pt able to complete all balance interventions bur with some instability. Pt tends to pose his balance posteriorly due to him putting more weight through heel with non compliant surfaces. Pt reports no recent falls or functional limitations.   ? Stability/Clinical Decision Making Stable/Uncomplicated   ? Rehab Potential Good   ? PT Frequency 2x / week   ? PT Duration 4 weeks   ? PT Next Visit Plan D/C PT   ? ?  ?  ? ?  ? ? ?Patient will benefit from skilled therapeutic intervention in order  to improve the following deficits and impairments:  Abnormal gait, Decreased coordination, Difficulty walking, Cardiopulmonary status limiting activity, Decreased endurance, Decreased activity tolerance,

## 2021-11-01 ENCOUNTER — Other Ambulatory Visit: Payer: Self-pay | Admitting: Interventional Cardiology

## 2021-11-02 ENCOUNTER — Other Ambulatory Visit: Payer: Self-pay | Admitting: Interventional Cardiology

## 2021-12-19 ENCOUNTER — Other Ambulatory Visit: Payer: Self-pay | Admitting: Interventional Cardiology

## 2021-12-22 ENCOUNTER — Telehealth: Payer: Self-pay | Admitting: Interventional Cardiology

## 2021-12-22 NOTE — Telephone Encounter (Signed)
Patient wife is stating that her husband started coughing with phlegm, ankles have a little swelling. This started last Sunday 12/17/21 they were out in the yard doing yard work. They thought this maybe allergies but the phlegm/cough has continued, no sore throat or fever. He had to sleep in the recliner for the last two nights because of the coughing. Noticed he had some pitting on bilateral ankles and feet this morning. They want to make sure his valves are okay or make sure it is not something else going on. 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  Changes from med list Losartan is PRN since 05/08/20 and he has not taken this in a long time, because is BP is "good".  Lasix is ordered every other day with additional dose if needed. He has not taken the lasix this way since at least Jan 2023. He just takes this when he has swelling in his legs, which is ~ once a month for a few days until the swelling is gone. Advised patient to take a lasix tablet today.  VS:  7/31 BP 112/80 HR 82 PO2 96  8/4 BP 135/89 HR 89 PO2 95 Wife mentions that she does not want anyone else listening to her husbands chest/heart because Dr. Irish Lack knows what his valves sound like.  Gave ED precautions.  Will forward to MD for advisement.

## 2021-12-22 NOTE — Telephone Encounter (Signed)
Pt spouse states that pt has a cough with a little phlegm. She states his ankles are a little puffy. She states that she wants someone to check his heart. Offered APP on 12/26/21 but she wanted someone to check today.

## 2021-12-23 ENCOUNTER — Telehealth: Payer: Self-pay | Admitting: Physician Assistant

## 2021-12-23 NOTE — Telephone Encounter (Signed)
Wife had called yesterday because Mr. Nordby has developed a nonproductive cough.  He slept on a recliner for the last 3 nights.  She asks about cough medicine.  I told her she could get Robitussin-DM, but no multisymptom cold products.  She did a home COVID test and it was negative.  She has given him Lasix at the increased dose per the phone note from yesterday.  Lower extremity edema has improved, but he still has orthopnea.  His weight today is 182-1/2 pounds, at his baseline.  Plan: Continue the higher dose of Lasix today.  Add cough medicine.  If the symptoms do not improve, call the cardiology office and his PCP tomorrow.  If his symptoms do not improve but his heart failure improves, he may need a PCR COVID test.  If his symptoms improve, he needs to be seen at the cardiology office to evaluate his heart failure and do a BMET after being on the increased dose of Lasix.  Patient and wife are on board with this plan.  Rosaria Ferries, PA-C 12/23/2021 10:59 AM

## 2021-12-25 ENCOUNTER — Ambulatory Visit: Payer: Medicare Other | Admitting: Internal Medicine

## 2021-12-25 ENCOUNTER — Encounter: Payer: Self-pay | Admitting: Internal Medicine

## 2021-12-25 ENCOUNTER — Telehealth: Payer: Self-pay | Admitting: Interventional Cardiology

## 2021-12-25 VITALS — BP 142/68 | HR 90 | Ht 72.0 in | Wt 187.6 lb

## 2021-12-25 DIAGNOSIS — Z79899 Other long term (current) drug therapy: Secondary | ICD-10-CM | POA: Diagnosis not present

## 2021-12-25 DIAGNOSIS — I5032 Chronic diastolic (congestive) heart failure: Secondary | ICD-10-CM | POA: Diagnosis not present

## 2021-12-25 NOTE — Telephone Encounter (Signed)
Eric [name] rescheduled pt to see NL DOD per request at 3:30 pm. Gave MD name and office location.  OV with [name] 12/26/21 canceled. Spouse reports called in over the weekend and was told pt would be seen today.  No further questions or concerns voiced.

## 2021-12-25 NOTE — Telephone Encounter (Signed)
Pt scheduled to see NL DOD today at 3:30 pm per [facility] request.

## 2021-12-25 NOTE — Patient Instructions (Signed)
Medication Instructions:  Please decrease lasix to '20mg'$  every other day (baseline dose)  *If you need a refill on your cardiac medications before your next appointment, please call your pharmacy*   Lab Work: BMET, BNP today   If you have labs (blood work) drawn today and your tests are completely normal, you will receive your results only by: [place] (if you have MyChart) OR A paper copy in the mail If you have any lab test that is abnormal or we need to change your treatment, we will call you to review the results.   Testing/Procedures: NONE   Follow-Up: At [facility], you and your health needs are our priority.  As part of our continuing mission to provide you with exceptional heart care, we have created designated Provider Care Teams.  These Care Teams include your primary Cardiologist (physician) and Advanced Practice Providers (APPs -  Physician Assistants and Nurse Practitioners) who all work together to provide you with the care you need, when you need it.  We recommend signing up for the patient portal called "MyChart".  Sign up information is provided on this After Visit Summary.  MyChart is used to connect with patients for Virtual Visits (Telemedicine).  Patients are able to view lab/test results, encounter notes, upcoming appointments, etc.  Non-urgent messages can be sent to your provider as well.   To learn more about what you can do with MyChart, go to [email or web address].    Your next appointment:   Routine appointment with Dr. Irish Lack is due October 2023

## 2021-12-25 NOTE — Telephone Encounter (Signed)
Eric [name] reports pt has taken increased dose of furosemide (40 mg)  Fri, Sat and Sun.  Coughing has improved but is still bad at night.  Weight is the same 182.  Denies been around anyone + for Covid.

## 2021-12-25 NOTE — Progress Notes (Signed)
Cardiology Office Note:    Date:  12/25/2021   ID:  Eric [name], DOB 01/28/1930, MRN [id number]  PCP:  Eric [name], [facility] Cardiologist:  Eric Grooms, MD     Referring MD: Eric Orn, MD   No chief complaint on file. C/f HFpeF exacerbation  History of Present Illness:    Eric [name] is a 88 y.o. male with a hx of bladder CA, CVA, CAD s/p AVR, MV repair and single vessel bypass 2009, atrial flutter on eliquis, DVT/PE, echo 2022 showed normal LV function, mildly reduced RV function and enlargement, no significant RVSP, normal mitral annuloplasty prosthesis, normal 23 mm Edwards Sapient prosthesis, HFpEF, patient of Eric. Irish [name] comes in today for an acute visit  Patient reports persistent coughing 8/5 and sleeping in a recliner. Home COVID was negative.  He was on lasix 20 mg every other day in January and he was euvolemic at that time. He called in recently with cough and his lasix was increased to 40. His dry weight is ~87 kg/ 192 in January. Weight recently 182 pounds. He has no LE edema. Wife concerned this could be due to his valve disease.   Past Medical History:  Diagnosis Date   Atrial flutter, paroxysmal ([place])    a. dx 04-02-2014, s/p  successful cardioversion 04-06-2014.   Chronic anticoagulation    on Eliquis--  due to recurrent dvt's and pe's   Dyslipidemia    History of bladder cancer urologist-  Eric Eric [name]   02-13-2016  s/p TURBT per path high grade papillary urothelial carcinoma   History of DVT (deep vein thrombosis)    2000-- RLE   History of melanoma excision    left flank;  07/ 2014 left lower leg and right upper chest   History of prostate cancer    Gleason 6--  s/p  radical prostatectomy 06/ 2000 in [place]---  no recurrence   History of pulmonary embolus (PE)    2000 & 2005  bilateral   Hx of valvuloplasty 06/19/2007   a. s/p mitral ring annuloplasty, repair ruptured chordae of P2 flail segments of MV    S/P aortic valve replacement with bioprosthetic valve    06-19-2007  severe aortic valve stenosis   S/P valve-in-valve TAVR 11/25/2018   Single vessel coronary artery disease   cardiologist-  Eric [name]   a. 05/2007 CABG x 1: s/p SVG-OM;  b. 10/2010 Ex MV: EF 72%, inf attenuation w/o ischemia, brief run of PAT with exercise. To   Stroke (Eric [name])    Thrombocytopenia (Eric [name])    Thyroid goiter 2013   nodular   Wears hearing aid    BILATERAL   Wears partial dentures    UPPER    Past Surgical History:  Procedure Laterality Date   AORTIC VALVE REPLACEMENT (AVR)/CORONARY ARTERY BYPASS GRAFTING (CABG)  06-19-2007  Eric Eric [name]   SVG to OM1 ;   AVR w/ #23 Mitralflow pericardial and ligation left atrial appendage;  MV repair w/ #28 Sorin 3-D memo Ring Annuloplasty with repair ruptured chordar of P-2 flail segments   CARDIAC CATHETERIZATION  05-12-2007  Eric Leonia Reeves   single vessel 30% ostial LAD,  80% LCx;  severe to critial AS,  moderate MR,  normal LVF, ef 70%,  normal right heart pressures and cardiac outputs,  mild elevated LV end-diastolic pressure     CARDIOVASCULAR STRESS TEST  11-02-2010  Eric [name]   normal nuclear study w/ no ischemia or infarct/scar/  normal LV function and wall motion , ef 72%   CARDIOVERSION N/A 04/06/2014   Procedure: CARDIOVERSION;  Surgeon: Eric Spark, MD;  Location: [facility];  Service: Cardiovascular;  Laterality: N/A;   successful   CATARACT EXTRACTION W/ INTRAOCULAR LENS  IMPLANT, BILATERAL  2012  approx   CORONARY ARTERY BYPASS GRAFT     PROSTATECTOMY     RETROPUBIC RADICAL PROSTATECTOMY  06/ 2000  in [place], [place]   RIGHT/LEFT HEART CATH AND CORONARY/GRAFT ANGIOGRAPHY N/A 10/29/2018   Procedure: RIGHT/LEFT HEART CATH AND CORONARY/GRAFT ANGIOGRAPHY;  Surgeon: Eric Mocha, MD;  Location: [facility];  Service: Cardiovascular;  Laterality: N/A;   ROTATOR CUFF REPAIR Left 09/1998   TEE WITHOUT CARDIOVERSION N/A 04/06/2014   Procedure: TRANSESOPHAGEAL  ECHOCARDIOGRAM (TEE);  Surgeon: Eric Spark, MD;  Location: [facility] ENDOSCOPY;  Service: Cardiovascular;  Laterality: N/A;  mild focal basa LVH of the septum, ef 50-55%, s/p AV annuloplasty ring, elongated chordae, thickened leaflets, mild to mod. MR, multiple small jets/arotic bioprosthetic valve sits well in position, mild AI/ thickened TV w/ mild reurg./mild LA   TEE WITHOUT CARDIOVERSION N/A 10/22/2018   Procedure: TRANSESOPHAGEAL ECHOCARDIOGRAM (TEE);  Surgeon: Eric Pain, MD;  Location[facility]COPY;  Service: Cardiovascular;  Laterality: N/A;   TEE WITHOUT CARDIOVERSION N/A 11/25/2018   Procedure: TRANSESOPHAGEAL ECHOCARDIOGRAM (TEE);  Surgeon: Eric Mocha, MD;  Location: [facility];  Service: Open Heart Surgery;  Laterality: N/A;   TRANSCATHETER AORTIC VALVE REPLACEMENT, TRANSFEMORAL N/A 11/25/2018   Procedure: TRANSCATHETER AORTIC VALVE REPLACEMENT, TRANSFEMORAL;  Surgeon: Eric Mocha, MD;  Location: [facility];  Service: Open Heart Surgery;  Laterality: N/A;   TRANSTHORACIC ECHOCARDIOGRAM  03-23-2016   Eric [name]   EF 55-60%, reduced contribution atrial contraction to ventricular filling, due to increased ventricular diastolic pressure or atrial contratile dysfunction/  bioprosthetic AV w/ mod. regurg. (valve area 1.97cm^2)/ mild dilated ascending aorta/ mild thicken MV normal function annular ring prosthesis/severe LAE & mod. to sev.RAE/ PASP 86mHg/ mild TR/ ventricle septal motion show paradox   TRANSURETHRAL RESECTION OF BLADDER TUMOR N/A 02/13/2016   Procedure: TRANSURETHRAL RESECTION OF BLADDER TUMOR (TURBT);  Surgeon: [name] MD;  [facility]  Service: Urology;  Laterality: N/A;   TRANSURETHRAL RESECTION OF BLADDER TUMOR N/A 09/04/2016   Procedure: TRANSURETHRAL RESECTION OF BLADDER TUMOR (TURBT);  Surgeon: [name] MD;  [facility]  Service: Urology;  Laterality: N/A;    Current Medications: Current Meds   Medication Sig   amoxicillin (AMOXIL) 500 MG tablet Take 4 tablets (2,000 mg total) by mouth as directed. Take 1 hour prior to dental work, including cleanings.   apixaban (ELIQUIS) 5 MG TABS tablet Take 1 tablet (5 mg total) by mouth 2 (two) times daily.   aspirin EC 81 MG tablet Take 81 mg by mouth daily. Swallow whole.   atorvastatin (LIPITOR) 40 MG tablet Take 1 tablet (40 mg total) by mouth every evening.   Eyelid Cleansers (OCUSOFT EYELID CLEANSING) PADS Place 1 application into the left eye in the morning.   ezetimibe (ZETIA) 10 MG tablet TAKE 1 TABLET BY MOUTH IN THE  EVENING   furosemide (LASIX) 20 MG tablet TAKE 1 TABLET BY MOUTH  EVERY OTHER DAY, MAY TAKE 1 ADDITIONAL DOSE IF NEEDED  FOR LEG SWELLING   losartan (COZAAR) 25 MG tablet TAKE 1 TABLET BY MOUTH  DAILY     Allergies:   Patient has no known allergies.   Social History  Socioeconomic History   Marital status: Married    Spouse name: Lelon Frohlich   Number of children: 2   Years of education: Not on file   Highest education level: Some college, no degree  Occupational History   Occupation: Retired  Tobacco Use   Smoking status: Never   Smokeless tobacco: Never  Substance and Sexual Activity   Alcohol use: Yes    Alcohol/week: 1.0 - 2.0 standard drink of alcohol    Types: 1 - 2 Standard drinks or equivalent per week    Comment: OCC   Drug use: No   Sexual activity: Not on file  Other Topics Concern   Not on file  Social History Narrative   01/03/21 Lives in [place] with wife.  Active.   Caffeine- 1-2 c coffee daily   Social Determinants of Health   Financial Resource Strain: Low Risk  (01/01/2019)   Overall Financial Resource Strain (CARDIA)    Difficulty of Paying Living Expenses: Not hard at all  Food Insecurity: No Food Insecurity (01/01/2019)   Hunger Vital Sign    Worried About Running Out of Food in the Last Year: Never true    Ran Out of Food in the Last Year: Never true  Transportation Needs: No  Transportation Needs (01/01/2019)   PRAPARE - Hydrologist (Medical): No    [name] of Transportation (Non-Medical): No  Physical Activity: Inactive (01/01/2019)   Exercise Vital Sign    Days of Exercise per Week: 0 days    Minutes of Exercise per Session: 0 min  Stress: No Stress Concern Present (01/01/2019)   [place]    Feeling of Stress : Only a little  Social Connections: Not on file     Family History: The patient's family history includes Leukemia in his father; Supraventricular tachycardia in his sister. There is no history of Heart attack, Stroke, or Hypertension.  ROS:   Please see the history of present illness.     All other systems reviewed and are negative.  EKGs/Labs/Other Studies Reviewed:    The following studies were reviewed today:    Recent Labs: 06/14/2021: BUN 16; Creatinine, Ser 1.00; Hemoglobin 15.3; Platelets 161; Potassium 4.5; Sodium 138   Recent Lipid Panel    Component Value Date/Time   CHOL 127 11/25/2020 0258   CHOL 130 04/27/2019 0840   TRIG 57 11/25/2020 0258   HDL 50 11/25/2020 0258   HDL 63 04/27/2019 0840   CHOLHDL 2.5 11/25/2020 0258   VLDL 11 11/25/2020 0258   LDLCALC 66 11/25/2020 0258   LDLCALC 53 04/27/2019 0840     Risk Assessment/Calculations:           Physical Exam:    VS:    Vitals:   12/25/21 1516  BP: (!) 142/68  Pulse: 90  SpO2: 97%     Wt Readings from Last 3 Encounters:  12/25/21 187 lb 9.6 oz (85.1 kg)  06/14/21 192 lb (87.1 kg)  05/09/21 185 lb (83.9 kg)     GEN: Well nourished, well developed in no acute distress. Lying flat comfortably HEENT: Normal NECK: No JVD; No carotid bruits LYMPHATICS: No lymphadenopathy CARDIAC: RRR, no murmurs, rubs, gallops RESPIRATORY:  Clear to auscultation without rales, wheezing or rhonchi  ABDOMEN: Soft, non-tender, non-distended MUSCULOSKELETAL:  No edema; No deformity   SKIN: Warm and dry NEUROLOGIC:  Alert and oriented x 3 PSYCHIATRIC:  Normal affect   ASSESSMENT:  Cough: Low suspicion this is related to a Hfpef exacerbation.  No murmur to suggest issues with his aortic/mitral prosthesis. Recommended he can continue to take his home lasix. Continue URI supportive care.  PLAN:    In order of problems listed above:  BMET BNP FU with Eric [name]           Medication Adjustments/Labs and Tests Ordered: Current medicines are reviewed at length with the patient today.  Concerns regarding medicines are outlined above.  Orders Placed This Encounter  Procedures   Basic metabolic panel   Brain natriuretic peptide   No orders of the defined types were placed in this encounter.   Patient Instructions  Medication Instructions:  Please decrease lasix to '20mg'$  every other day (baseline dose)  *If you need a refill on your cardiac medications before your next appointment, please call your pharmacy*   Lab Work: BMET, BNP today   If you have labs (blood work) drawn today and your tests are completely normal, you will receive your results only by: [place] (if you have MyChart) OR A paper copy in the mail If you have any lab test that is abnormal or we need to change your treatment, we will call you to review the results.   Testing/Procedures: NONE   Follow-Up: At [name], you and your health needs are our priority.  As part of our continuing mission to provide you with exceptional heart care, we have created designated Provider Care Teams.  These Care Teams include your primary Cardiologist (physician) and Advanced Practice Providers (APPs -  Physician Assistants and Nurse Practitioners) who all work together to provide you with the care you need, when you need it.  We recommend signing up for the patient portal called "MyChart".  Sign up information is provided on this After Visit Summary.  MyChart is used to connect with  patients for Virtual Visits (Telemedicine).  Patients are able to view lab/test results, encounter notes, upcoming appointments, etc.  Non-urgent messages can be sent to your provider as well.   To learn more about what you can do with MyChart, go to [email or web address].    Your next appointment:   Routine appointment with Eric. Irish [name] is due October 2023     Signed, Janina Mayo, MD  12/25/2021 3:44 PM    [place]

## 2021-12-25 NOTE — Telephone Encounter (Signed)
Spouse states that pt has been sleeping upright in a recliner for the last 5 nights due to a cough that  he has had. She stoke with a PA on Saturday and was told that pt could be seen today. Spouse would like a callback regarding this matter. Please advise

## 2021-12-26 ENCOUNTER — Ambulatory Visit: Payer: Medicare Other | Admitting: Nurse Practitioner

## 2021-12-26 LAB — BASIC METABOLIC PANEL
BUN/Creatinine Ratio: 18 (ref 10–24)
BUN: 18 mg/dL (ref 10–36)
CO2: 27 mmol/L (ref 20–29)
Calcium: 9.5 mg/dL (ref 8.6–10.2)
Chloride: 99 mmol/L (ref 96–106)
Creatinine, Ser: 0.98 mg/dL (ref 0.76–1.27)
Glucose: 80 mg/dL (ref 70–99)
Potassium: 5 mmol/L (ref 3.5–5.2)
Sodium: 142 mmol/L (ref 134–144)
eGFR: 73 mL/min/{1.73_m2} (ref >59)

## 2021-12-26 LAB — BRAIN NATRIURETIC PEPTIDE: BNP: 50.7 pg/mL (ref 0.0–100.0)

## 2022-01-05 ENCOUNTER — Ambulatory Visit
Admission: RE | Admit: 2022-01-05 | Discharge: 2022-01-05 | Disposition: A | Discharge status: Home / Self Care | Payer: Medicare Other | Source: Ambulatory Visit | Attending: Internal Medicine | Admitting: Internal Medicine

## 2022-01-05 ENCOUNTER — Other Ambulatory Visit: Payer: Self-pay | Admitting: Internal Medicine

## 2022-01-05 DIAGNOSIS — R0989 Other specified symptoms and signs involving the circulatory and respiratory systems: Secondary | ICD-10-CM

## 2022-01-13 ENCOUNTER — Other Ambulatory Visit: Payer: Self-pay | Admitting: Interventional Cardiology

## 2022-02-26 ENCOUNTER — Ambulatory Visit: Payer: Medicare Other | Attending: Interventional Cardiology | Admitting: Interventional Cardiology

## 2022-02-26 ENCOUNTER — Encounter: Payer: Self-pay | Admitting: Interventional Cardiology

## 2022-02-26 VITALS — BP 130/70 | HR 84 | Ht 72.0 in | Wt 187.0 lb

## 2022-02-26 DIAGNOSIS — I5032 Chronic diastolic (congestive) heart failure: Secondary | ICD-10-CM | POA: Diagnosis not present

## 2022-02-26 DIAGNOSIS — Z952 Presence of prosthetic heart valve: Secondary | ICD-10-CM

## 2022-02-26 DIAGNOSIS — I10 Essential (primary) hypertension: Secondary | ICD-10-CM | POA: Diagnosis not present

## 2022-02-26 DIAGNOSIS — Z9889 Other specified postprocedural states: Secondary | ICD-10-CM

## 2022-02-26 DIAGNOSIS — D6869 Other thrombophilia: Secondary | ICD-10-CM

## 2022-02-26 DIAGNOSIS — I25118 Atherosclerotic heart disease of native coronary artery with other forms of angina pectoris: Secondary | ICD-10-CM

## 2022-02-26 DIAGNOSIS — I482 Chronic atrial fibrillation, unspecified: Secondary | ICD-10-CM

## 2022-02-26 LAB — CBC
Hematocrit: 42.4 % (ref 37.5–51.0)
Hemoglobin: 14.4 g/dL (ref 13.0–17.7)
MCH: 30.8 pg (ref 26.6–33.0)
MCHC: 34 g/dL (ref 31.5–35.7)
MCV: 91 fL (ref 79–97)
Platelets: 153 10*3/uL (ref 150–450)
RBC: 4.68 x10E6/uL (ref 4.14–5.80)
RDW: 13.5 % (ref 11.6–15.4)
WBC: 5.5 10*3/uL (ref 3.4–10.8)

## 2022-02-26 NOTE — Patient Instructions (Signed)
Medication Instructions:  Your physician recommends that you continue on your current medications as directed. Please refer to the Current Medication list given to you today.  *If you need a refill on your cardiac medications before your next appointment, please call your pharmacy*   Lab Work: Lab work to be done today--CBC If you have labs (blood work) drawn today and your tests are completely normal, you will receive your results only by: [place] (if you have MyChart) OR A paper copy in the mail If you have any lab test that is abnormal or we need to change your treatment, we will call you to review the results.   Testing/Procedures: none   Follow-Up: At [facility], you and your health needs are our priority.  As part of our continuing mission to provide you with exceptional heart care, we have created designated Provider Care Teams.  These Care Teams include your primary Cardiologist (physician) and Advanced Practice Providers (APPs -  Physician Assistants and Nurse Practitioners) who all work together to provide you with the care you need, when you need it.  We recommend signing up for the patient portal called "MyChart".  Sign up information is provided on this After Visit Summary.  MyChart is used to connect with patients for Virtual Visits (Telemedicine).  Patients are able to view lab/test results, encounter notes, upcoming appointments, etc.  Non-urgent messages can be sent to your provider as well.   To learn more about what you can do with MyChart, go to [email or web address].    Your next appointment:   9 month(s)  The format for your next appointment:   In Person  Provider:   [name], MD     Other Instructions   Important Information About Sugar

## 2022-02-26 NOTE — Progress Notes (Signed)
Cardiology Office Note   Date:  02/26/2022   ID:  Eric [name], DOB 05-10-1930, MRN [id number]  PCP:  Eric [name]    Chief Complaint  Patient presents with   Follow-up   AFib  Wt Readings from Last 3 Encounters:  02/26/22 187 lb (84.8 kg)  12/25/21 187 lb 9.6 oz (85.1 kg)  06/14/21 192 lb (87.1 kg)       History of Present Illness: Eric [name] is a 88 y.o. male  with severe valvular heart disease.   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   He has had an aortic valve replacement , MV repair and single vessel bypass in 2009.  He had a negative stress test in 2011. No chest pain prior to valve replacement surgery. He did have SHOB.   In late 2015, his heart rate was noted to be elevated at the time of a tooth extraction. He was found to be in atrial flutter. He underwent successful cardioversion. He notes that during the time he was out of rhythm, he had a cough. This got better after the cardioversion.   He went to [place] in February 2016 for about 3 weeks and did well on the NOAC.  Less bleeding and bruising on Eliquis compared to his experience Coumadin.  He then changed from Coumadin to Eliquis to treat multiple DVT and PE.   He had a tooth extraction years ago. When he stayed on his anticoagulation, he had significant bleeding issues at that time. THe next time, he came off of the Eliquis.   In late 2017, he had several instances of blood clots in his urine. He underwent cystoscopy in the urology clinic after holding out was for 4 days. This showed a malignancy. He had surgery and tolerated this well.   He has noticed more edema in his right leg since surgery back in 2017.  It persists.    In late 2019, he was in [place] and was volume overloaded and short of breath. He was given IV Lasix and diuresed well. He was kept overnight.  He was also found to be in AFib.  He dd not feel  Palpitations.     They were eating out all of the time.   He had stopped his Lasix with his bladder cancer.  Echo showed normal LV function and 3+ AI.     In early 2020, He had a procedure on the left ear.  It was bleeding and it was cauterized.  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       I spoke to his PMD, Eric [name] in 07/2018.  His labs revealed concern for low grade hemolysis.  TEE was considered but the patient was in [place], and given the COVID outbreak, the procedure was postponed.     He required repeat surgical procedure on the squamous cell area.  He had some bleeding post procedure and required more stitches.  He required a skin graft.     TEE was done in 09/2018:  The left ventricle has low normal systolic function, with an ejection fraction of 50-55%. Left ventricular diastolic function could not be evaluated. No evidence of left ventricular regional wall motion abnormalities.  2. The right ventricle has normal systolc function. The cavity was normal. There is no increase in right ventricular wall thickness.  3. Left atrial size was mildly dilated.  4. Right atrial size was mildly dilated.  5. Moderately thickened tricuspid valve leaflets.  6. 77m Sorin mitral valve repair annular ring intact.  7. 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  8. A 28 Mitralflow porcine bioprosthesis valve is present in the aortic position. Echo findings are consistent with intravalvular regurgitation of the aortic prosthesis.  9. Aortic valve regurgitation is severe by color flow Doppler. Mild stenosis of the aortic valve. 10. No vegetation on the aortic valve. 11. The aortic root is normal in size and structure. 12. Previously ligated left atrial appendage. 13. Severe bioprosthetic aortic valve regurgitation.   D/w Eric [name] plans for TAVR w/u.   POst TAVR f/u showed: "He was evaluated by the multidisciplinary valve team and underwent successful valve in valve TAVR with a 23 mm Edwards Sapien 3 THV via the  TF approach on 11/25/18. Post operative echo showed EF 50-55%, severe MAC with mild MS, mild-mod TR and normally functioning TAVR with mean gradient 10.557mg and no PVL. His BP was noted to be elevated post operatively and he was started on Losartan 2532maily. He was discharged on home Eliquis and aspirin. Lasix decreased from 72m72mily to 20mg4mly.    Follow up labs showed increase in creat 1.05--> 1.44. Lasix decreased to 20mg 36my other day. Bilirubin was still elevated and this was sent to his PCP for review."   He has had some dental issues.  He had to have a tooth pulled in 03/2019.  Eliquis was continued.     He is in a balance class also, 2x/week.  Fell while playing pickle ball.   TAVR clinic visit in 7/21, things were stable.   Had some low BP in 10/21.  Holding losartan when BP is around 130 mm Hg. BP has been in the [id number] systolic since then.   CVA in 11/2020 which was from small vessel disease, while on Eliquis. Losartan has been on hold.  Aspirin was added after stroke per neuro.     Had COVID in September 2023.  Was concerned that he may have been volume overloaded but when he saw his primary care doctor, they diagnosed him with sinusitis.  Denies : Chest pain. Dizziness.  Nitroglycerin use. Orthopnea. Palpitations. Paroxysmal nocturnal dyspnea. Shortness of breath. Syncope.    Minimal ankle edema   Past Medical History:  Diagnosis Date   Atrial flutter, paroxysmal (HCC)  [place]. dx 04-02-2014, s/p  successful cardioversion 04-06-2014.   Chronic anticoagulation    on Eliquis--  due to recurrent dvt's and pe's   Dyslipidemia    History of bladder cancer urologist-  Eric [name]  s/p TURBT per path high grade papillary urothelial carcinoma   History of DVT (deep vein thrombosis)    2000-- RLE   History of melanoma excision    left flank;  07/ 2014 left lower leg and right upper chest   History of prostate cancer    Gleason 6--  s/p  radical  prostatectomy 06/ 2000 in [place]---  no recurrence   History of pulmonary embolus (PE)    2000 & 2005  bilateral   Hx of valvuloplasty 06/19/2007   a. 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  10/2010 Ex MV: EF 72%, inf attenuation w/o ischemia, brief run of PAT with exercise. To   Stroke ([place])    Thrombocytopenia ([place])    Thyroid goiter 2013   nodular   Wears hearing aid    BILATERAL   Wears partial dentures    UPPER    Past Surgical History:  Procedure Laterality Date   AORTIC VALVE REPLACEMENT (AVR)/CORONARY ARTERY BYPASS GRAFTING (CABG)  06-19-2007  Eric [name]   SVG to OM1 ;   AVR w/ #23 Mitralflow pericardial and ligation left atrial appendage;  MV repair w/ #28 Sorin 3-D memo Ring Annuloplasty with repair ruptured chordar of P-2 flail segments   CARDIAC CATHETERIZATION  05-12-2007  Eric Leonia Reeves   single vessel 30% ostial LAD,  80% LCx;  severe to critial AS,  moderate MR,  normal LVF, ef 70%,  normal right heart pressures and cardiac outputs,  mild elevated LV end-diastolic pressure     CARDIOVASCULAR STRESS TEST  11-02-2010  Eric Irish Lack   normal nuclear study w/ no ischemia or infarct/scar/  normal LV function and wall motion , ef 72%   CARDIOVERSION N/A 04/06/2014   Procedure: CARDIOVERSION;  Surgeon: [name], [name];  Location: [facility];  Service: Cardiovascular;  Laterality: N/A;   successful   CATARACT EXTRACTION W/ INTRAOCULAR LENS  IMPLANT, BILATERAL  2012  approx   CORONARY ARTERY BYPASS GRAFT     PROSTATECTOMY     RETROPUBIC RADICAL PROSTATECTOMY  06/ 2000  in [place], [place]   RIGHT/LEFT HEART CATH AND CORONARY/GRAFT ANGIOGRAPHY N/A 10/29/2018   Procedure: RIGHT/LEFT HEART CATH AND  CORONARY/GRAFT ANGIOGRAPHY;  Surgeon: [name], [name];  Location: [facility];  Service: Cardiovascular;  Laterality: N/A;   ROTATOR CUFF REPAIR Left 09/1998   TEE WITHOUT CARDIOVERSION N/A 04/06/2014   Procedure: TRANSESOPHAGEAL ECHOCARDIOGRAM (TEE);  Surgeon: [name], [name];  Location: [facility] ENDOSCOPY;  Service: Cardiovascular;  Laterality: N/A;  mild focal basa LVH of the septum, ef 50-55%, s/p AV annuloplasty ring, elongated chordae, thickened leaflets, mild to mod. MR, multiple small jets/arotic bioprosthetic valve sits well in position, mild AI/ thickened TV w/ mild reurg./mild LA   TEE WITHOUT CARDIOVERSION N/A 10/22/2018   Procedure: TRANSESOPHAGEAL ECHOCARDIOGRAM (TEE);  Surgeon: [name], [name];  Location[facility];  Service: Cardiovascular;  Laterality: N/A;   TEE WITHOUT CARDIOVERSION N/A 11/25/2018   Procedure: TRANSESOPHAGEAL ECHOCARDIOGRAM (TEE);  Surgeon: [name], [name];  Location: [facility];  Service: Open Heart Surgery;  Laterality: N/A;   TRANSCATHETER AORTIC VALVE REPLACEMENT, TRANSFEMORAL N/A 11/25/2018   Procedure: TRANSCATHETER AORTIC VALVE REPLACEMENT, TRANSFEMORAL;  Surgeon: [name], [name];  Location: [place];  Service: Open Heart Surgery;  Laterality: N/A;   TRANSTHORACIC ECHOCARDIOGRAM  03-23-2016   Eric Irish Lack   EF 55-60%, reduced contribution atrial contraction to ventricular filling, due to increased ventricular diastolic pressure or atrial contratile dysfunction/  bioprosthetic AV w/ mod. regurg. (valve area 1.97cm^2)/ mild dilated ascending aorta/ mild thicken MV normal function annular ring prosthesis/severe LAE & mod. to sev.RAE/ PASP 7mHg/ mild TR/ ventricle septal motion show paradox   TRANSURETHRAL RESECTION OF BLADDER TUMOR N/A 02/13/2016   Procedure: TRANSURETHRAL RESECTION OF BLADDER TUMOR (TURBT);  Surgeon: [name] [name];  [facility]  Service: Urology;  Laterality: N/A;   TRANSURETHRAL RESECTION OF  BLADDER TUMOR N/A 09/04/2016   Procedure: TRANSURETHRAL RESECTION OF BLADDER TUMOR (TURBT);  Surgeon: [name] [name];  [facility]  Service: Urology;  Laterality: N/A;     Current Outpatient Medications  Medication Sig Dispense Refill   amoxicillin (AMOXIL) 500 MG tablet Take 4 tablets (2,000 mg total) by mouth as directed. Take 1 hour prior to dental work, including cleanings. 4 tablet 6   apixaban (ELIQUIS) 5 MG TABS tablet Take 1 tablet (5 mg total) by mouth 2 (two) times daily. 180 tablet 3   aspirin EC 81 MG tablet Take 81 mg by mouth daily. Swallow whole.     atorvastatin (LIPITOR) 40 MG tablet Take 1 tablet (40 mg total) by mouth every evening. 90 tablet 3   Eyelid Cleansers (OCUSOFT EYELID CLEANSING) PADS Place 1 application into the left eye in the morning.     ezetimibe (ZETIA) 10 MG tablet TAKE 1 TABLET BY MOUTH IN THE  EVENING 90 tablet 1   furosemide (LASIX) 20 MG tablet TAKE 1 TABLET BY MOUTH  EVERY OTHER DAY MAY TAKE 1  ADDITIONAL DOSE IF NEEDED  FOR LEG SWELLING 90 tablet 3   No current facility-administered medications for this visit.    Allergies:   Patient has no known allergies.    Social History:  The patient  reports that he has never smoked. He has never used smokeless tobacco. He reports current alcohol use of about 1.0 - 2.0 standard drink of alcohol per week. He reports that he does not use drugs.   Family History:  The patient's family history includes Leukemia in his father; Supraventricular tachycardia in his sister.    ROS:  Please see the history of present illness.   Otherwise, review of systems are positive for .   All other systems are reviewed and negative.    PHYSICAL EXAM: VS:  BP 130/70 (BP Location: Left Arm, Patient Position: Sitting, Cuff Size: Normal)   Pulse 84   Ht 6' (1.829 m)   Wt 187 lb (84.8 kg)   SpO2 97%   BMI 25.36 kg/m  , BMI Body mass index is 25.36 kg/m. GEN: Well nourished, well developed,  in no acute distress HEENT: normal Neck: no JVD, carotid bruits, or masses Cardiac: irregularly irregular; 1/6 systolic murmur, no rubs, or gallops; tr ankle edema bilaterally Respiratory:  clear to auscultation bilaterally, normal work of breathing GI: soft, nontender, nondistended, + BS MS: no deformity or atrophy Skin: warm and dry, no rash Neuro:  Strength and sensation are intact Psych: euthymic mood, full affect   EKG:   The ekg ordered today demonstrates AFib, rate controlled   Recent Labs: 06/14/2021: Hemoglobin 15.3; Platelets 161 12/25/2021: BNP 50.7; BUN 18; Creatinine, Ser 0.98; Potassium 5.0; Sodium 142   Lipid Panel    Component Value Date/Time   CHOL 127 11/25/2020 0258   CHOL 130 04/27/2019 0840   TRIG 57 11/25/2020 0258   HDL 50 11/25/2020 0258   HDL 63 04/27/2019 0840   CHOLHDL 2.5 11/25/2020 0258   VLDL 11 11/25/2020 0258   LDLCALC 66 11/25/2020 0258   LDLCALC 53 04/27/2019 0840     Other studies Reviewed: Additional studies/ records that were reviewed today with results demonstrating: .   ASSESSMENT AND PLAN:  AFib: CVA in 2022 while on Eliquis.  Aspirin was added.  Need to monitor for bleeding issues.  Check CBC today. S/p TAVR: Continue SBE prophylaxis. CAD: Continue aggressive secondary prevention.  No angina while exercising.   Chronic diastolic heart failure: Low-salt diet.  Avoid processed foods.  Decreased furosemide to QOD.  Staying euvolemic on this dose.  Has the flexibility  to take an extra pill if needed. Prior DVT: On Eliquis.  We talked about avoiding falling. HTN: Continue current medicines.  Home readings well-controlled.  120s at home.     Current medicines are reviewed at length with the patient today.  The patient concerns regarding his medicines were addressed.  The following changes have been made:  No change  Labs/ tests ordered today include:  No orders of the defined types were placed in this encounter.   Recommend 150  minutes/week of aerobic exercise Low fat, low carb, high fiber diet recommended  Disposition:   FU in 9 months   Signed, [name], [name]  02/26/2022 9:39 AM    [facility] [address], [place], [place]  [address] Phone: [phone]; Fax: [phone]

## 2022-03-17 ENCOUNTER — Other Ambulatory Visit: Payer: Self-pay | Admitting: Interventional Cardiology

## 2022-04-17 ENCOUNTER — Other Ambulatory Visit: Payer: Self-pay | Admitting: Interventional Cardiology

## 2022-06-12 ENCOUNTER — Other Ambulatory Visit: Payer: Self-pay | Admitting: Interventional Cardiology

## 2022-07-06 ENCOUNTER — Other Ambulatory Visit: Payer: Self-pay | Admitting: Interventional Cardiology

## 2022-07-06 DIAGNOSIS — I4811 Longstanding persistent atrial fibrillation: Secondary | ICD-10-CM

## 2022-07-09 NOTE — Telephone Encounter (Signed)
Eliquis 76m refill request received. Patient is 89years old, weight-84.8kg, Crea-0.98 on 12/25/21, Diagnosis-Aflutter & DVT & PE & CVA, and last seen by Dr. VIrish Lack10/9/23 Dose is appropriate based on dosing criteria. Will send in refill to requested pharmacy.

## 2022-08-07 LAB — COMPREHENSIVE METABOLIC PANEL WITH GFR: EGFR: 74

## 2022-08-29 ENCOUNTER — Other Ambulatory Visit (HOSPITAL_COMMUNITY): Payer: Self-pay | Admitting: Medical

## 2022-08-29 DIAGNOSIS — M79662 Pain in left lower leg: Secondary | ICD-10-CM

## 2022-08-30 ENCOUNTER — Ambulatory Visit (HOSPITAL_COMMUNITY)
Admission: RE | Admit: 2022-08-30 | Discharge: 2022-08-30 | Disposition: A | Discharge status: Home / Self Care | Payer: Medicare Other | Source: Ambulatory Visit | Attending: Cardiology | Admitting: Cardiology

## 2022-08-30 DIAGNOSIS — M7989 Other specified soft tissue disorders: Secondary | ICD-10-CM | POA: Diagnosis not present

## 2022-08-30 DIAGNOSIS — M79662 Pain in left lower leg: Secondary | ICD-10-CM | POA: Insufficient documentation

## 2022-10-15 ENCOUNTER — Emergency Department (HOSPITAL_COMMUNITY): Payer: Medicare Other

## 2022-10-15 ENCOUNTER — Encounter (HOSPITAL_COMMUNITY): Payer: Self-pay

## 2022-10-15 ENCOUNTER — Other Ambulatory Visit: Payer: Self-pay

## 2022-10-15 ENCOUNTER — Emergency Department (HOSPITAL_COMMUNITY)
Admission: EM | Admit: 2022-10-15 | Discharge: 2022-10-15 | Disposition: A | Discharge status: Home / Self Care | Payer: Medicare Other | Attending: Emergency Medicine | Admitting: Emergency Medicine

## 2022-10-15 DIAGNOSIS — I4891 Unspecified atrial fibrillation: Secondary | ICD-10-CM | POA: Insufficient documentation

## 2022-10-15 DIAGNOSIS — I5033 Acute on chronic diastolic (congestive) heart failure: Secondary | ICD-10-CM | POA: Diagnosis not present

## 2022-10-15 DIAGNOSIS — Z7982 Long term (current) use of aspirin: Secondary | ICD-10-CM | POA: Insufficient documentation

## 2022-10-15 DIAGNOSIS — I2510 Atherosclerotic heart disease of native coronary artery without angina pectoris: Secondary | ICD-10-CM | POA: Insufficient documentation

## 2022-10-15 DIAGNOSIS — R079 Chest pain, unspecified: Secondary | ICD-10-CM | POA: Diagnosis present

## 2022-10-15 DIAGNOSIS — Z7901 Long term (current) use of anticoagulants: Secondary | ICD-10-CM | POA: Insufficient documentation

## 2022-10-15 DIAGNOSIS — R0789 Other chest pain: Secondary | ICD-10-CM

## 2022-10-15 LAB — CBC WITH DIFFERENTIAL/PLATELET
Abs Immature Granulocytes: 0.01 10*3/uL (ref 0.00–0.07)
Basophils Absolute: 0.1 10*3/uL (ref 0.0–0.1)
Basophils Relative: 1 %
Eosinophils Absolute: 0 10*3/uL (ref 0.0–0.5)
Eosinophils Relative: 1 %
HCT: 46.4 % (ref 39.0–52.0)
Hemoglobin: 15.3 g/dL (ref 13.0–17.0)
Immature Granulocytes: 0 %
Lymphocytes Relative: 33 %
Lymphs Abs: 1.6 10*3/uL (ref 0.7–4.0)
MCH: 31 pg (ref 26.0–34.0)
MCHC: 33 g/dL (ref 30.0–36.0)
MCV: 93.9 fL (ref 80.0–100.0)
Monocytes Absolute: 0.4 10*3/uL (ref 0.1–1.0)
Monocytes Relative: 9 %
Neutro Abs: 2.7 10*3/uL (ref 1.7–7.7)
Neutrophils Relative %: 56 %
Platelets: 162 10*3/uL (ref 150–400)
RBC: 4.94 MIL/uL (ref 4.22–5.81)
RDW: 12.4 % (ref 11.5–15.5)
WBC: 4.9 10*3/uL (ref 4.0–10.5)
nRBC: 0 % (ref 0.0–0.2)

## 2022-10-15 LAB — COMPREHENSIVE METABOLIC PANEL
ALT: 18 U/L (ref 0–44)
AST: 27 U/L (ref 15–41)
Albumin: 3.8 g/dL (ref 3.5–5.0)
Alkaline Phosphatase: 41 U/L (ref 38–126)
Anion gap: 7 (ref 5–15)
BUN: 19 mg/dL (ref 8–23)
CO2: 24 mmol/L (ref 22–32)
Calcium: 9.1 mg/dL (ref 8.9–10.3)
Chloride: 107 mmol/L (ref 98–111)
Creatinine, Ser: 0.95 mg/dL (ref 0.61–1.24)
GFR, Estimated: >60 mL/min (ref >60)
Glucose, Bld: 108 mg/dL — ABNORMAL HIGH (ref 70–99)
Potassium: 3.8 mmol/L (ref 3.5–5.1)
Sodium: 138 mmol/L (ref 135–145)
Total Bilirubin: 3.6 mg/dL — ABNORMAL HIGH (ref 0.3–1.2)
Total Protein: 6.7 g/dL (ref 6.5–8.1)

## 2022-10-15 LAB — TROPONIN I (HIGH SENSITIVITY)
Troponin I (High Sensitivity): 8 ng/L (ref <18)
Troponin I (High Sensitivity): 9 ng/L (ref <18)

## 2022-10-15 LAB — PROTIME-INR
INR: 1.3 — ABNORMAL HIGH (ref 0.8–1.2)
Prothrombin Time: 16.5 seconds — ABNORMAL HIGH (ref 11.4–15.2)

## 2022-10-15 LAB — APTT: aPTT: 29 seconds (ref 24–36)

## 2022-10-15 LAB — LIPASE, BLOOD: Lipase: 42 U/L (ref 11–51)

## 2022-10-15 MED ORDER — IOHEXOL 350 MG/ML SOLN
75.0000 mL | Freq: Once | INTRAVENOUS | Status: AC | PRN
  Administered 2022-10-15: 75 mL via INTRAVENOUS

## 2022-10-15 NOTE — ED Notes (Signed)
Patient transported to CT 

## 2022-10-15 NOTE — Discharge Instructions (Signed)
You are seen in the emergency department today for chest pain.  Thankfully your workup was negative at this time with a negative troponin as well as all other labs being unremarkable.  EKG was also unremarkable for acute signs of a heart attack.  Your CT scan did not show any signs of any pulmonary embolism.  I would advise a follow-up with your cardiologist for further evaluation and testing.

## 2022-10-15 NOTE — ED Provider Notes (Signed)
 [place] EMERGENCY DEPARTMENT AT [facility] Provider Note   CSN: [id number] Arrival date & time: 10/15/22  1248     History Chief Complaint  Patient presents with   Chest Pain    Eric [name] is a 89 y.o. male.  Patient with a past medical history significant for atrial fibrillation, aortic valve replacement, coronary atherosclerosis of native coronary artery, diastolic heart failure, pulmonary embolism, mitral valve repair presents to the emergency department complaints of chest pain.  He reports that this morning he woke up he felt some left-sided chest pain without radiation to the extremity.  He states that at that hide the chest pain was approximately 1 out of 10.  He then reports that he ate breakfast although with somewhat of a decreased appetite and states that chest pain has not improved at that point.  Patient was transported by EMS in which they gave him 324 mg of aspirin as well as nitroglycerin.  He reports that he had improvement in his pain with this.  Pain is now currently back to a 1 out of 10.  Denies any nausea, diaphoresis, confusion, shortness of breath.  Patient currently takes Eliquis for past history of A-fib and valve replacement.  Patient sees Dr. Eldridge Dace with cardiology and last saw him on 02/26/2022.   Chest Pain      Home Medications Prior to Admission medications   Medication Sig Start Date End Date Taking? Authorizing Provider  apixaban (ELIQUIS) 5 MG TABS tablet TAKE 1 TABLET BY MOUTH TWICE  DAILY 07/09/22  Yes Corky Crafts, MD  aspirin EC 81 MG tablet Take 81 mg by mouth daily. Swallow whole.   Yes [provider]  atorvastatin (LIPITOR) 20 MG tablet Take 40 mg by mouth every evening.   Yes [provider]  Eyelid Cleansers (OCUSOFT EYELID CLEANSING) PADS Place 1 application into the left eye in the morning.   Yes [provider]  ezetimibe (ZETIA) 10 MG tablet TAKE 1 TABLET BY MOUTH IN THE  EVENING  03/19/22  Yes Corky Crafts, MD  furosemide (LASIX) 20 MG tablet TAKE 1 TABLET BY MOUTH  EVERY OTHER DAY MAY TAKE 1  ADDITIONAL DOSE IF NEEDED  FOR LEG SWELLING 01/15/22  Yes Corky Crafts, MD  amoxicillin (AMOXIL) 500 MG tablet Take 4 tablets (2,000 mg total) by mouth as directed. Take 1 hour prior to dental work, including cleanings. 04/27/19   Corky Crafts, MD  nitroGLYCERIN (NITROSTAT) 0.4 MG SL tablet Place 1 tablet (0.4 mg total) under the tongue every 5 (five) minutes as needed. 10/18/22 01/16/23  Gaston Islam., NP      Allergies    Patient has no known allergies.    Review of Systems   Review of Systems  Cardiovascular:  Positive for chest pain.  All other systems reviewed and are negative.   Physical Exam Updated Vital Signs BP 136/88 (BP Location: Right Arm)   Pulse 70   Temp 97.8 F (36.6 C) (Oral)   Resp 19   Ht 6' (1.829 m)   Wt 83.9 kg   SpO2 97%   BMI 25.09 kg/m  Physical Exam Vitals and nursing note reviewed.  Constitutional:      General: He is not in acute distress.    Appearance: He is well-developed.  HENT:     Head: Normocephalic and atraumatic.  Eyes:     Conjunctiva/sclera: Conjunctivae normal.  Cardiovascular:     Rate and Rhythm: Normal  rate. Rhythm irregularly irregular.     Heart sounds: Murmur heard.     Systolic murmur is present.  Pulmonary:     Effort: Pulmonary effort is normal. No respiratory distress.     Breath sounds: Normal breath sounds.  Abdominal:     Palpations: Abdomen is soft.     Tenderness: There is no abdominal tenderness.  Musculoskeletal:        General: No swelling.     Cervical back: Neck supple.     Right lower leg: 1+ Edema present.     Left lower leg: 2+ Edema present.  Skin:    General: Skin is warm and dry.     Capillary Refill: Capillary refill takes less than 2 seconds.  Neurological:     Mental Status: He is alert.  Psychiatric:        Mood and Affect: Mood normal.     ED  Results / Procedures / Treatments   Labs (all labs ordered are listed, but only abnormal results are displayed) Labs Reviewed  PROTIME-INR - Abnormal; Notable for the following components:      Result Value   Prothrombin Time 16.5 (*)    INR 1.3 (*)    All other components within normal limits  COMPREHENSIVE METABOLIC PANEL - Abnormal; Notable for the following components:   Glucose, Bld 108 (*)    Total Bilirubin 3.6 (*)    All other components within normal limits  CBC WITH DIFFERENTIAL/PLATELET  APTT  LIPASE, BLOOD  TROPONIN I (HIGH SENSITIVITY)  TROPONIN I (HIGH SENSITIVITY)    EKG EKG Interpretation  Date/Time:  Monday Oct 15 2022 13:05:29 EDT Ventricular Rate:  81 PR Interval:    QRS Duration: 93 QT Interval:  367 QTC Calculation: 382 R Axis:   48 Text Interpretation: Atrial fibrillation Ventricular premature complex Confirmed by Alvino Blood ([id number]) on 10/15/2022 2:08:12 PM  Radiology No results found.  Procedures Procedures   Medications Ordered in ED Medications  iohexol (OMNIPAQUE) 350 MG/ML injection 75 mL (75 mLs Intravenous Contrast Given 10/15/22 1700)    ED Course/ Medical Decision Making/ A&P                           Medical Decision Making Amount and/or Complexity of Data Reviewed Labs: ordered. Radiology: ordered.  Risk Prescription drug management.   This patient presents to the ED for concern of chest pain, this involves an extensive number of treatment options, and is a complaint that carries with it a high risk of complications and morbidity.  The differential diagnosis includes ACS, PE, pulmonary edema, CHF, viral URI, bronchitis, pneumonia   Co morbidities that complicate the patient evaluation  Embolism and thrombosis, A-fib, acute on chronic diastolic heart failure, history of mitral valve repair   Lab Tests:  I Ordered, and personally interpreted labs.  The pertinent results include: CBC unremarkable, CMP unremarkable,  troponin negative, lipase normal at 42, PT/INR and APTT unremarkable   Imaging Studies ordered:  I ordered imaging studies including chest x-ray, CT angio chest I independently visualized and interpreted imaging which showed no acute cardiopulmonary process, no evidence of a pulmonary embolism pneumonia I agree with the radiologist interpretation   Cardiac Monitoring: / EKG:  The patient was maintained on a cardiac monitor.  I personally viewed and interpreted the cardiac monitored which showed an underlying rhythm of: Atrial fibrillation   Consultations Obtained:  I requested consultation with the cardiology,  and discussed lab  and imaging findings as well as pertinent plan - they recommend: Outpatient evaluation   Problem List / ED Course / Critical interventions / Medication management  Patient presents emergency department complaints of chest pain.  Reports that chest pain began this morning and symptomatic improvement with aspirin and nitroglycerin.  He reports the chest pain feels like a dull ache in the left side of his chest without any significant radiation anywhere else.  Patient has some extensive cardiac history.  Will evaluate patient with basic labs and imaging. CBC, CMP, lipase, troponin unremarkable.  PT/INR and APTT unremarkable.  Chest x-ray and CT angio chest ordered for evaluation of cardiac symptoms.  Imaging test were negative for any acute findings.  Will discuss with cardiology for further recommendations. Discussed with cardiology and patient patient's heart score, patient is safe and stable for discharge outpatient for further evaluation.  Informed patient of cardiology recommendations and patient is agreeable to treatment plan.  Advised patient to return to the emergency department if he has any acute worsening of symptoms.  Patient is agreeable treatment plan verbalized understanding all return precautions. I have reviewed the patients home medicines and have made  adjustments as needed   Test / Admission - Considered:  Spoke with Dr. Orson Aloe, cardiology, who determined that based on patient's heart score, he is likely safe for discharge home with outpatient cardiology follow up for likely stress testing.  Final Clinical Impression(s) / ED Diagnoses Final diagnoses:  Other chest pain  Atrial fibrillation, unspecified type [facility])    Rx / DC Orders ED Discharge Orders          Ordered    Ambulatory referral to Cardiology        10/15/22 2100              Smitty Knudsen, PA-C 10/20/22 1700    Lonell Grandchild, MD 10/21/22 1115

## 2022-10-15 NOTE — ED Notes (Signed)
Spoke w/ the PA about pt being able to eat. PA said it was fine for him to ear at this time   Gave pt a [place] sandwich and apple juice a this time   Wife is at bedside, pt is in a chair

## 2022-10-15 NOTE — ED Triage Notes (Signed)
Pt to er room number 7 via ems, per ems pt is here for chest pain that started this am, states that they gave 324 of aspirin and 1 nitro and pt was pain free.  Pt states that when he woke up this am around 9 am he had a left sided dull ache/chest pain, states that he had some breakfast, and then called 911, states that in route it went away and now it is back a little bit.

## 2022-10-16 NOTE — Progress Notes (Unsigned)
Office Visit    Patient Name: Eric [name] Date of Encounter: 10/18/2022  Primary Care Provider:  Aliene Beams, MD Primary Cardiologist:  Lance Muss, MD Primary Electrophysiologist: None   Past Medical History    Past Medical History:  Diagnosis Date   Atrial flutter, paroxysmal [facility])    a. dx 04-02-2014, s/p  successful cardioversion 04-06-2014.   Chronic anticoagulation    on Eliquis--  due to recurrent dvt's and pe's   Dyslipidemia    History of bladder cancer urologist-  dr Sherryl Barters   02-13-2016  s/p TURBT per path high grade papillary urothelial carcinoma   History of DVT (deep vein thrombosis)    2000-- RLE   History of melanoma excision    left flank;  07/ 2014 left lower leg and right upper chest   History of prostate cancer    Gleason 6--  s/p  radical prostatectomy 06/ 2000 in [place], [place]---  no recurrence   History of pulmonary embolus (PE)    2000 & 2005  bilateral   Hx of valvuloplasty 06/19/2007   a. 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 05/2007 CABG x 1: s/p SVG-OM;  b. 10/2010 Ex MV: EF 72%, inf attenuation w/o ischemia, brief run of PAT with exercise. To   Stroke (HCC)    Thrombocytopenia (HCC)    Thyroid goiter 2013   nodular   Wears hearing aid    BILATERAL   Wears partial dentures    UPPER   Past Surgical History:  Procedure Laterality Date   AORTIC VALVE REPLACEMENT (AVR)/CORONARY ARTERY BYPASS GRAFTING (CABG)  06-19-2007  dr Laneta Simmers   SVG to OM1 ;   AVR w/ #23 Mitralflow pericardial and ligation left atrial appendage;  MV repair w/ #28 Sorin 3-D memo Ring Annuloplasty with repair ruptured chordar of P-2 flail segments   CARDIAC CATHETERIZATION  05-12-2007  dr Amil Amen   single vessel  30% ostial LAD,  80% LCx;  severe to critial AS,  moderate MR,  normal LVF, ef 70%,  normal right heart pressures and cardiac outputs,  mild elevated LV end-diastolic pressure     CARDIOVASCULAR STRESS TEST  11-02-2010  dr Eldridge Dace   normal nuclear study w/ no ischemia or infarct/scar/  normal LV function and wall motion , ef 72%   CARDIOVERSION N/A 04/06/2014   Procedure: CARDIOVERSION;  Surgeon: Lars Masson, MD;  Location: [facility] ENDOSCOPY;  Service: Cardiovascular;  Laterality: N/A;   successful   CATARACT EXTRACTION W/ INTRAOCULAR LENS  IMPLANT, BILATERAL  2012  approx   CORONARY ARTERY BYPASS GRAFT     PROSTATECTOMY     RETROPUBIC RADICAL PROSTATECTOMY  06/ 2000  in [place], [place]   RIGHT/LEFT HEART CATH AND CORONARY/GRAFT ANGIOGRAPHY N/A 10/29/2018   Procedure: RIGHT/LEFT HEART CATH AND CORONARY/GRAFT ANGIOGRAPHY;  Surgeon: Tonny Bollman, MD;  Location: [facility] INVASIVE CV LAB;  Service: Cardiovascular;  Laterality: N/A;   ROTATOR CUFF REPAIR Left 09/1998   TEE WITHOUT CARDIOVERSION N/A 04/06/2014   Procedure: TRANSESOPHAGEAL ECHOCARDIOGRAM (TEE);  Surgeon: Lars Masson, MD;  Location: [facility] ENDOSCOPY;  Service: Cardiovascular;  Laterality: N/A;  mild focal basa LVH of the septum, ef 50-55%, s/p AV annuloplasty ring, elongated chordae, thickened leaflets, mild to  mod. MR, multiple small jets/arotic bioprosthetic valve sits well in position, mild AI/ thickened TV w/ mild reurg./mild LA   TEE WITHOUT CARDIOVERSION N/A 10/22/2018   Procedure: TRANSESOPHAGEAL ECHOCARDIOGRAM (TEE);  Surgeon: Jake Bathe, MD;  Location: [facility]  ENDOSCOPY;  Service: Cardiovascular;  Laterality: N/A;   TEE WITHOUT CARDIOVERSION N/A 11/25/2018   Procedure: TRANSESOPHAGEAL ECHOCARDIOGRAM (TEE);  Surgeon: Tonny Bollman, MD;  Location: [facility] OR;  Service: Open Heart Surgery;  Laterality: N/A;   TRANSCATHETER AORTIC VALVE REPLACEMENT, TRANSFEMORAL N/A 11/25/2018   Procedure: TRANSCATHETER AORTIC VALVE REPLACEMENT,  TRANSFEMORAL;  Surgeon: Tonny Bollman, MD;  Location: [facility] OR;  Service: Open Heart Surgery;  Laterality: N/A;   TRANSTHORACIC ECHOCARDIOGRAM  03-23-2016   dr Eldridge Dace   EF 55-60%, reduced contribution atrial contraction to ventricular filling, due to increased ventricular diastolic pressure or atrial contratile dysfunction/  bioprosthetic AV w/ mod. regurg. (valve area 1.97cm^2)/ mild dilated ascending aorta/ mild thicken MV normal function annular ring prosthesis/severe LAE & mod. to sev.RAE/ PASP 56mmHg/ mild TR/ ventricle septal motion show paradox   TRANSURETHRAL RESECTION OF BLADDER TUMOR N/A 02/13/2016   Procedure: TRANSURETHRAL RESECTION OF BLADDER TUMOR (TURBT);  Surgeon: Hildred Laser, MD;  Location: [facility];  Service: Urology;  Laterality: N/A;   TRANSURETHRAL RESECTION OF BLADDER TUMOR N/A 09/04/2016   Procedure: TRANSURETHRAL RESECTION OF BLADDER TUMOR (TURBT);  Surgeon: Hildred Laser, MD;  Location: [facility];  Service: Urology;  Laterality: N/A;    Allergies  No Known Allergies   History of Present Illness    Eric [name]  is a 89 year old male with a PMH of CAD s/p CABG 2009, AVR s/p TAVR, DVT and PE, prostate CA s/p radical prostatectomy, atrial flutter s/p DCCV 2015, HLD who presents today for complaint of chest pain.  Eric [name] was previously followed by Dr. Ty Hilts and is currently followed by Dr. Eldridge Dace.  He was diagnosed with a PE and DVT in 2000 and 2005.  He underwent a LHC in 2008 that revealed 80% stenosis and proximal circumflex.  He underwent CABG x 1 by Dr. Laneta Simmers and 2009 with SVG-OM and valvuloplasty with mitral ring.  He underwent a TAVR in 2014.  He was diagnosed with atrial flutter in 2015 and underwent successful DCCV.  He was diagnosed with prostate CA in 2017 and underwent radical prostatectomy.  He underwent a valve in valve TAVR in 11/2018 for severe aortic stenosis.  He suffered a CVA in 11/2020 from small  vessel disease and ASA 81 mg was added per neurology.  He was last seen by Dr. Eldridge Dace on 02/2022.  He had COVID in 01/2022 blood pressure was well-controlled.  He also had Lasix decreased to every other day for volume control.  He presented to the ED 10/15/2022 with left-sided chest pain.  He reported pain radiating to his extremities and was transported to the ED via EMS and treated with nitro x 1 and 324 mg ASA.  Eric [name] presents today with his wife for post ED follow-up.  Since last being seen in the office patient reports that his chest discomfort has resolved and is no longer occurring since his ED visit.  His blood pressure today is controlled at 136/78 and heart rate was 76 bpm.  He is for spitting and physical therapy twice per week and denies any associated symptoms.  He is also using his Lasix twice per week and weights today are stable. He reports the discomfort being a heaviness in  the left chest area that is not reproducible with activity. He denied any associated palpitations or shortness of breath with his discomfort. Patient denies chest pain, palpitations, dyspnea, PND, orthopnea, nausea, vomiting, dizziness, syncope, edema, weight gain, or early satiety.   Home Medications    Current Outpatient Medications  Medication Sig Dispense Refill   amoxicillin (AMOXIL) 500 MG tablet Take 4 tablets (2,000 mg total) by mouth as directed. Take 1 hour prior to dental work, including cleanings. 4 tablet 6   apixaban (ELIQUIS) 5 MG TABS tablet TAKE 1 TABLET BY MOUTH TWICE  DAILY 180 tablet 1   aspirin EC 81 MG tablet Take 81 mg by mouth daily. Swallow whole.     atorvastatin (LIPITOR) 20 MG tablet Take 40 mg by mouth every evening.     Eyelid Cleansers (OCUSOFT EYELID CLEANSING) PADS Place 1 application into the left eye in the morning.     ezetimibe (ZETIA) 10 MG tablet TAKE 1 TABLET BY MOUTH IN THE  EVENING 90 tablet 3   furosemide (LASIX) 20 MG tablet TAKE 1 TABLET BY MOUTH  EVERY OTHER DAY  MAY TAKE 1  ADDITIONAL DOSE IF NEEDED  FOR LEG SWELLING 90 tablet 3   nitroGLYCERIN (NITROSTAT) 0.4 MG SL tablet Place 1 tablet (0.4 mg total) under the tongue every 5 (five) minutes as needed. 25 tablet 0   No current facility-administered medications for this visit.     Review of Systems  Please see the history of present illness.     All other systems reviewed and are otherwise negative except as noted above.  Physical Exam    Wt Readings from Last 3 Encounters:  10/18/22 189 lb (85.7 kg)  10/15/22 185 lb (83.9 kg)  02/26/22 187 lb (84.8 kg)   VS: Vitals:   10/18/22 0829  BP: 136/78  Pulse: 76  SpO2: 97%  ,Body mass index is 25.63 kg/m.  Constitutional:      Appearance: Healthy appearance. Not in distress.  Neck:     Vascular: JVD normal.  Pulmonary:     Effort: Pulmonary effort is normal.     Breath sounds: No wheezing. No rales. Diminished in the bases Cardiovascular:  Irregularly irregular. Normal S1. Normal S2.      Murmurs: There is no murmur.  Edema:    Peripheral edema absent.  Abdominal:     Palpations: Abdomen is soft non tender. There is no hepatomegaly.  Skin:    General: Skin is warm and dry.  Neurological:     General: No focal deficit present.     Mental Status: Alert and oriented to person, place and time.     Cranial Nerves: Cranial nerves are intact.  EKG/LABS/ Recent Cardiac Studies    ECG personally reviewed by me today -none completed today  Cardiac Studies & Procedures   CARDIAC CATHETERIZATION  CARDIAC CATHETERIZATION 10/29/2018  Narrative 1.  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  Single-vessel coronary artery disease with severe stenosis of the proximal left circumflex and continued patency of the saphenous vein graft to first OM 3.  Patent left main, LAD, and RCA with mild diffuse  nonobstructive disease 4.  Normal right heart pressures and preserved cardiac output  Recommend: Continued multidisciplinary team evaluation with CTA studies and formal surgical consultation with Dr. Laneta Simmers in anticipation of possible valve-in-valve TAVR  Findings Coronary Findings Diagnostic  Dominance: Right  Left Main Mid LM to Dist LM lesion is 25% stenosed. The lesion is mildly calcified.  Left Anterior Descending Vessel is large. The vessel exhibits minimal luminal irregularities. The LAD is a large vessel that courses to the LV apex.  The vessel has minor irregularities but no significant stenoses.  The first diagonal is large in caliber.  Left Circumflex Prox Cx lesion is 75% stenosed.  Right Coronary Artery There is mild diffuse disease throughout the vessel. Mid RCA lesion is 40% stenosed. The lesion is moderately calcified.  Saphenous Graft To 1st Mrg SVG graft was visualized by angiography and is normal in caliber. The graft exhibits no disease. The saphenous vein graft to first OM is widely patent throughout.  There are no significant stenoses.  The graft fills the circumflex and retrograde fashion.  Intervention  No interventions have been documented.     ECHOCARDIOGRAM  ECHOCARDIOGRAM COMPLETE BUBBLE STUDY 11/25/2020  Narrative ECHOCARDIOGRAM REPORT    Patient Name:   Eric [name] Date of Exam: 11/25/2020 Medical Rec #:  [id number]       Height:       72.0 in Accession #:    [id number]      Weight:       192.2 lb Date of Birth:  07-27-1929       BSA:          2.095 m Patient Age:    89 years        BP:           152/87 mmHg Patient Gender: M               HR:           83 bpm. Exam Location:  Inpatient  Procedure: 2D Echo, Cardiac Doppler and Color Doppler  Indications:    TIA  History:        Patient has prior history of Echocardiogram examinations, most recent 11/25/2019.  Sonographer:    Shirlean Kelly Referring Phys: [id number] CAROLE N  HALL  IMPRESSIONS   1. Left ventricular ejection fraction, by estimation, is 55 to 60%. The left ventricle has normal function. The left ventricle has no regional wall motion abnormalities. There is mild left ventricular hypertrophy. Left ventricular diastolic parameters are indeterminate. 2. Right ventricular systolic function is mildly reduced. The right ventricular size is moderately enlarged. There is normal pulmonary artery systolic pressure. The estimated right ventricular systolic pressure is 29.3 mmHg. 3. Left atrial size was mildly dilated. 4. Right atrial size was moderately dilated. 5. The mitral valve has been repaired/replaced. No evidence of mitral valve regurgitation. 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 The mean mitral valve gradient is 4.0 mmHg at 56bpm 6. The aortic valve has been repaired/replaced. Aortic valve regurgitation is trivial. 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 MG 7 mmHg. Echo findings are consistent with normal structure and function of the aortic valve prosthesis. 7. Aortic dilatation noted. There is mild dilatation of the ascending aorta, measuring 39 mm. 8. The inferior vena cava is dilated in size with >50% respiratory variability, suggesting right atrial pressure of 8 mmHg. 9. Agitated saline contrast bubble study was negative, with no evidence of any interatrial shunt.  FINDINGS Left Ventricle: Left ventricular ejection fraction, by estimation, is 55 to 60%. The left ventricle has normal function. The left ventricle has no regional wall motion abnormalities. The left ventricular internal cavity size  was normal in size. There is mild left ventricular hypertrophy. Left ventricular diastolic parameters are indeterminate.  Right Ventricle: The right ventricular size is moderately enlarged. No increase in right ventricular wall thickness. Right ventricular systolic  function is mildly reduced. There is normal pulmonary artery systolic pressure. The tricuspid regurgitant velocity is 2.31 m/s, and with an assumed right atrial pressure of 8 mmHg, the estimated right ventricular systolic pressure is 29.3 mmHg.  Left Atrium: Left atrial size was mildly dilated.  Right Atrium: Right atrial size was moderately dilated.  Pericardium: There is no evidence of pericardial effusion.  Mitral Valve: The mitral valve has been repaired/replaced. No evidence of mitral valve regurgitation. 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 MV peak gradient, 14.0 mmHg. The mean mitral valve gradient is 4.0 mmHg.  Tricuspid Valve: The tricuspid valve is normal in structure. Tricuspid valve regurgitation is trivial.  Aortic Valve: The aortic valve has been repaired/replaced. Aortic valve regurgitation is trivial. Aortic valve mean gradient measures 5.5 mmHg. Aortic valve peak gradient measures 10.2 mmHg. Aortic valve area, by VTI measures 2.10 cm. 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 Echo findings are consistent with normal structure and function of the aortic valve prosthesis.  Pulmonic Valve: The pulmonic valve was not well visualized. Pulmonic valve regurgitation is not visualized.  Aorta: Aortic dilatation noted. There is mild dilatation of the ascending aorta, measuring 39 mm.  Venous: The inferior vena cava is dilated in size with greater than 50% respiratory variability, suggesting right atrial pressure of 8 mmHg.  IAS/Shunts: The interatrial septum was not well visualized. Agitated saline contrast was given intravenously to evaluate for intracardiac shunting. Agitated saline contrast bubble study was negative, with no evidence of any interatrial shunt.   LEFT VENTRICLE PLAX 2D LVIDd:         4.95 cm LVIDs:         3.30 cm LV PW:         1.05 cm LV IVS:        0.95 cm LVOT diam:      2.00 cm LV SV:         60 LV SV Index:   28 LVOT Area:     3.14 cm   RIGHT VENTRICLE            IVC RV Basal diam:  4.90 cm    IVC diam: 2.40 cm RV S prime:     8.38 cm/s  LEFT ATRIUM             Index       RIGHT ATRIUM           Index LA diam:        3.70 cm 1.77 cm/m  RA Area:     25.80 cm LA Vol (A2C):   80.0 ml 38.19 ml/m RA Volume:   79.85 ml  38.12 ml/m LA Vol (A4C):   84.8 ml 40.48 ml/m LA Biplane Vol: 84.0 ml 40.10 ml/m AORTIC VALVE AV Area (Vmax):    2.17 cm AV Area (Vmean):   2.18 cm AV Area (VTI):     2.10 cm AV Vmax:           159.50 cm/s AV Vmean:          107.600 cm/s AV VTI:            0.284 m AV Peak Grad:  10.2 mmHg AV Mean Grad:      5.5 mmHg LVOT Vmax:         110.00 cm/s LVOT Vmean:        74.600 cm/s LVOT VTI:          0.190 m LVOT/AV VTI ratio: 0.67  AORTA Ao Asc diam: 3.90 cm  MITRAL VALVE                TRICUSPID VALVE MV Area (PHT): 2.96 cm     TR Peak grad:   21.3 mmHg MV Area VTI:   1.18 cm     TR Vmax:        231.00 cm/s MV Peak grad:  14.0 mmHg MV Mean grad:  4.0 mmHg     SHUNTS MV Vmax:       1.87 m/s     Systemic VTI:  0.19 m MV Vmean:      81.1 cm/s    Systemic Diam: 2.00 cm MV Decel Time: 256 msec MV E velocity: 175.00 cm/s  Epifanio Lesches MD Electronically signed by Epifanio Lesches MD Signature Date/Time: 11/25/2020/2:23:37 PM    Final   TEE  ECHO TEE 10/22/2018  Narrative TRANSESOPHOGEAL ECHO REPORT    Patient Name:   Eric [name] Date of Exam: 10/22/2018 Medical Rec #:  [id number]       Height:       72.0 in Accession #:    [id number]      Weight:       187.0 lb Date of Birth:  1929-09-10       BSA:          2.07 m Patient Age:    88 years        BP:           126/49 mmHg Patient Gender: M               HR:           74 bpm. Exam Location:  Inpatient   Procedure: Transesophageal Echo, Color Doppler, Cardiac Doppler and 3D Echo  Indications:     I35.1 Nonrheumatic aortic (valve)  insufficiency  History:         Patient has prior history of Echocardiogram examinations. Prior CABG Signs/Symptoms: Shortness of Breath. Aortic Valve: A 28 Mitralflow porcine aortic valve bioprosthesis  Sonographer:     Leta Jungling RDCS Referring Phys:  [id number] Corky Crafts Diagnosing Phys: Donato Schultz MD    PROCEDURE: Local oropharyngeal anesthetic was provided with viscous lidocaine. The transesophogeal probe was passed through the esophogus of the patient. The patient developed no complications during the procedure.  IMPRESSIONS   1. The left ventricle has low normal systolic function, with an ejection fraction of 50-55%. Left ventricular diastolic function could not be evaluated. No evidence of left ventricular regional wall motion abnormalities. 2. The right ventricle has normal systolc function. The cavity was normal. There is no increase in right ventricular wall thickness. 3. Left atrial size was mildly dilated. 4. Right atrial size was mildly dilated. 5. Moderately thickened tricuspid valve leaflets. 6. 28mm Sorin mitral valve repair annular ring intact. 7. 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 8. A 28 Mitralflow porcine bioprosthesis valve is present in the aortic position. Echo findings are consistent with intravalvular regurgitation of the aortic prosthesis. 9. Aortic valve regurgitation is severe by color flow Doppler. Mild stenosis of the aortic valve. 10. No vegetation on the aortic valve. 11.  The aortic root is normal in size and structure. 12. Previously ligated left atrial appendage. 13. Severe bioprosthetic aortic valve regurgitation.  FINDINGS Left Ventricle: The left ventricle has low normal systolic function, with an ejection fraction of 50-55%. There is no increase in left ventricular wall thickness. Left ventricular diastolic function could not be evaluated. No evidence of left ventricular regional wall  motion abnormalities.  Right Ventricle: The right ventricle has normal systolic function. The cavity was normal. There is no increase in right ventricular wall thickness.  Left Atrium: Left atrial size was mildly dilated.   Right Atrium: Right atrial size was mildly dilated. Right atrial pressure is estimated at 10 mmHg.  Interatrial Septum: No atrial level shunt detected by color flow Doppler.  Pericardium: There is no evidence of pericardial effusion.  Mitral Valve: The mitral valve has been repaired/replaced. Mitral valve regurgitation is mild by color flow Doppler. 28mm Sorin mitral valve repair annular ring intact.  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 Tricuspid valve regurgitation is moderate-severe by color flow Doppler. The tricuspid valve is moderately thickened.  Aortic Valve: The aortic valve has been repaired/replaced Aortic valve regurgitation is severe by color flow Doppler. There is Mild stenosis of the aortic valve. A 28 Mitralflow porcine aortic valve bioprosthesis valve is present in the aortic position. Echo findings are consistent with intravalvular regurgitation of the aortic prosthesis. There is no evidence of a vegetation on the aortic valve.  Pulmonic Valve: The pulmonic valve was grossly normal. Pulmonic valve regurgitation is not visualized by color flow Doppler.  Aorta: The aortic root is normal in size and structure.  Additional Findings: Severe bioprosthetic aortic valve regurgitation. Previously ligated left atrial appendage.   +-------------+------------++ AORTIC VALVE              +-------------+------------++ AV Vmax:     260.00 cm/s  +-------------+------------++ AV Vmean:    181.000 cm/s +-------------+------------++ AV VTI:      0.569 m      +-------------+------------++ AV Peak Grad:27.0 mmHg    +-------------+------------++ AV Mean Grad:15.0 mmHg    +-------------+------------++ AR PHT:      303  msec     +-------------+------------++  +-------------+---------++ +---------------+-----------++ MITRAL VALVE           TRICUSPID VALVE            +-------------+---------++ +---------------+-----------++ MV Peak grad:8.4 mmHg  TR Peak grad:  26.6 mmHg   +-------------+---------++ +---------------+-----------++ MV Mean grad:3.0 mmHg  TR Vmax:       258.00 cm/s +-------------+---------++ +---------------+-----------++ MV Vmax:     1.45 m/s  +-------------+---------++ MV Vmean:    66.7 cm/s +-------------+---------++ MV VTI:      0.24 m    +-------------+---------++   Donato Schultz MD Electronically signed by Donato Schultz MD Signature Date/Time: 10/22/2018/2:41:47 PM    Final    CT SCANS  CT CORONARY MORPH W/CTA COR W/SCORE 11/03/2018  Addendum 11/03/2018  5:32 PM ADDENDUM REPORT: 11/03/2018 17:29  EXAM: OVER-READ INTERPRETATION  CT CHEST  The following report is an over-read performed by radiologist Dr. Lesia Hausen [facility] on 11/03/2018. This over-read does not include interpretation of cardiac or coronary anatomy or pathology. The cardiac CTA interpretation by the cardiologist is attached.  COMPARISON:  11/11/2006 chest CT angiogram.  FINDINGS: Please see the separate concurrent chest CT angiogram report for details.  IMPRESSION: Please see the separate concurrent chest CT angiogram report for details.   Electronically Signed By: Delbert Phenix M.D. On: 11/03/2018  17:29  Narrative CLINICAL DATA:  AVR Pre Valve in Valve TAVR  EXAM: Cardiac TAVR CT  TECHNIQUE: The patient was scanned on a Siemens Force 192 slice scanner. A 120 kV retrospective scan was triggered in the ascending thoracic aorta at 140 HU's. Gantry rotation speed was 250 msecs and collimation was .6 mm. No beta blockade or nitro were given. The 3D data set was reconstructed in 5% intervals of the R-R cycle. Systolic and diastolic phases  were analyzed on a dedicated work station using MPR, MIP and VRT modes. The patient received 80 cc of contrast.  FINDINGS: Aortic Valve: There is a 23 mm Mitroflow stented pericardial bioprosthetic in place. Sewing ring appears intact with no areas of dehiscence The leaflets are thickened and there appears to be prolapse of the right coronary cusp.  Aorta: Moderate calcific atherosclerosis No aneurysm. Normal arch vessels  Sino-tubular Junction: 29 mm  Ascending Thoracic Aorta: 36 mm  Aortic Arch: 29 mm  Descending Thoracic Aorta: 24 mm  Sinus of Valsalva Measurements:  Non-coronary: 29.4 mm  Right - coronary: 28 mm  Left -   coronary: 30.6 mm  Coronary Artery Height above Annulus:  Left Main: 10.6 mm above annulus  Right Coronary: 10.9 mm above annulus  Valve ID: 19.2 mm  Area inside sewing ring 334 mm2  There is a patent SVG to the OM branch. The patient has had a mitral valve repair with annuloplasty ring. The LAA was supposed to have been ligated but appears patent with no obvious thrombus  IMPRESSION: 1. 23 mm Mitroflow stented pericardial tissue valve with thickened leaflets and possible prolapse of right cusp.  2. On SA basal view LM is 7.3 mm from valve leaflet border and RCA is 7.4 mm away However there is concern for the coronary heights Even a 20 mm Sapein 3 is 14 mm in height  The RCA appears to rest behind the right leaflet with STJ calcium and may be at higher risk of occlusion  3.  Normal aortic root 3.6 cm  4.  Post mitral valve repair with annuloplasty ring  5.  LAA is patent Surgical note indicates ligation  6.  Patent SVG to OM  Eric [name]  Electronically Signed: By: Eric [name] M.D. On: 11/03/2018 15:16          Risk Assessment/Calculations:    CHA2DS2-VASc Score = 5   This indicates a 7.2% annual risk of stroke. The patient's score is based upon: CHF History: 0 HTN History: 0 Diabetes History: 0 Stroke History:  2 Vascular Disease History: 1 Age Score: 2 Gender Score: 0           Lab Results  Component Value Date   WBC 4.9 10/15/2022   HGB 15.3 10/15/2022   HCT 46.4 10/15/2022   MCV 93.9 10/15/2022   PLT 162 10/15/2022   Lab Results  Component Value Date   CREATININE 0.95 10/15/2022   BUN 19 10/15/2022   NA 138 10/15/2022   K 3.8 10/15/2022   CL 107 10/15/2022   CO2 24 10/15/2022   Lab Results  Component Value Date   ALT 18 10/15/2022   AST 27 10/15/2022   ALKPHOS 41 10/15/2022   BILITOT 3.6 (H) 10/15/2022   Lab Results  Component Value Date   CHOL 127 11/25/2020   HDL 50 11/25/2020   LDLCALC 66 11/25/2020   TRIG 57 11/25/2020   CHOLHDL 2.5 11/25/2020    Lab Results  Component Value Date  HGBA1C 5.8 (H) 11/25/2020     Assessment & Plan    1.  Chest pain: -Patient recently seen in the ED for complaint of chest heaviness and was evaluated with no acute causes discovered. -Today patient reports that his discomfort has resolved and has not reoccurred since his ED visit. -Due to associated symptoms and history we will have patient complete Lexiscan Myoview to rule out ischemia. -We will have patient take as needed Nitrostat 0.4 mg  2.  Coronary artery disease: -s/p CABG x 1 in 2009 with most recent ischemic evaluation completed 10/2018 with [facility] showing continued patency of saphenous vein graft to circumflex and nonobstructive disease in LAD, RCA and left main -Today patient reports no ongoing chest discomfort since ED visit. -Continue GDMT with atorvastatin 40 mg and ezetimibe 10 mg -Patient will complete Lexiscan stress test as noted above. -He was advised to go to the ED if chest pain reoccurs and is not relieved with as needed Nitrostat.  3.  History of aortic and mitral valve disease: -s/p surgical MVR and AVR 2009 with TAVR in 2020 -Continue Lasix as needed -SBE prophylaxis for dental procedures  4.  HFpEF: -Patient's 2D echo was 55-60% on 11/2020 -Today  patient is euvolemic on exam and has no reports of shortness of breath. -Patient advised to continue Lasix as needed for lower extremity swelling.  5.  HTN: -Patient's blood pressure today was well-controlled at 136/78 -Patient is currently not on any antihypertensive medications  6.  Persistent AF: -Patient is rate controlled today with heart rate of 76 bpm. -Patient's last hemoglobin was 15.3 and creatinine was 0.9 -Continue Eliquis 5 mg twice daily   Disposition: Follow-up with Lance Muss, MD or APP in 4 months   Shared Decision Making/Informed Consent The risks [chest pain, shortness of breath, cardiac arrhythmias, dizziness, blood pressure fluctuations, myocardial infarction, stroke/transient ischemic attack, nausea, vomiting, allergic reaction, radiation exposure, metallic taste sensation and life-threatening complications (estimated to be 1 in 10,000)], benefits (risk stratification, diagnosing coronary artery disease, treatment guidance) and alternatives of a nuclear stress test were discussed in detail with Mr. Turner and he agrees to proceed.   Medication Adjustments/Labs and Tests Ordered: Current medicines are reviewed at length with the patient today.  Concerns regarding medicines are outlined above.   Signed, Eric [name], Leodis Rains, NP 10/18/2022, 12:06 PM [facility]

## 2022-10-18 ENCOUNTER — Encounter: Payer: Self-pay | Admitting: Nurse Practitioner

## 2022-10-18 ENCOUNTER — Ambulatory Visit: Payer: Medicare Other | Attending: Nurse Practitioner | Admitting: Nurse Practitioner

## 2022-10-18 VITALS — BP 136/78 | HR 76 | Ht 72.0 in | Wt 189.0 lb

## 2022-10-18 DIAGNOSIS — I2510 Atherosclerotic heart disease of native coronary artery without angina pectoris: Secondary | ICD-10-CM | POA: Diagnosis not present

## 2022-10-18 DIAGNOSIS — R079 Chest pain, unspecified: Secondary | ICD-10-CM

## 2022-10-18 DIAGNOSIS — Z952 Presence of prosthetic heart valve: Secondary | ICD-10-CM

## 2022-10-18 DIAGNOSIS — I4891 Unspecified atrial fibrillation: Secondary | ICD-10-CM | POA: Diagnosis not present

## 2022-10-18 DIAGNOSIS — Z9889 Other specified postprocedural states: Secondary | ICD-10-CM

## 2022-10-18 MED ORDER — NITROGLYCERIN 0.4 MG SL SUBL: 0.4000 mg | SUBLINGUAL_TABLET | SUBLINGUAL | 0 refills | Status: DC | PRN

## 2022-10-18 NOTE — Patient Instructions (Signed)
Medication Instructions:  START Nitroglycerin 0.4mg  Take 1 as needed for emergency chest pain. Take first dose for emergency chest pain; WAIT 5 minutes and if still having chest pain CALL 911 and then take 2nd dose. IF still having pain wait an additional 5 minutes before taking final dose. Do not take more than 3 doses in a day.  *If you need a refill on your cardiac medications before your next appointment, please call your pharmacy*   Lab Work: None Ordered If you have labs (blood work) drawn today and your tests are completely normal, you will receive your results only by: MyChart Message (if you have MyChart) OR A paper copy in the mail If you have any lab test that is abnormal or we need to change your treatment, we will call you to review the results.   Testing/Procedures: Your physician has requested that you have a lexiscan myoview. For further information please visit [email or web address] Please follow instruction sheet, as given.   Follow-Up: At [facility], you and your health needs are our priority.  As part of our continuing mission to provide you with exceptional heart care, we have created designated Provider Care Teams.  These Care Teams include your primary Cardiologist (physician) and Advanced Practice Providers (APPs -  Physician Assistants and Nurse Practitioners) who all work together to provide you with the care you need, when you need it.  We recommend signing up for the patient portal called "MyChart".  Sign up information is provided on this After Visit Summary.  MyChart is used to connect with patients for Virtual Visits (Telemedicine).  Patients are able to view lab/test results, encounter notes, upcoming appointments, etc.  Non-urgent messages can be sent to your provider as well.   To learn more about what you can do with MyChart, go to [email or web address].    Your next appointment:   4-5 month(s)  Provider:   Lance Muss, MD     Other  Instructions

## 2022-10-25 ENCOUNTER — Telehealth (HOSPITAL_COMMUNITY): Payer: Self-pay | Admitting: *Deleted

## 2022-10-25 NOTE — Telephone Encounter (Signed)
Spoke with patient and he was given detailed instructions about his STRESS TEST on 10/26/22 at 10:30.

## 2022-10-26 ENCOUNTER — Ambulatory Visit (HOSPITAL_COMMUNITY): Payer: Medicare Other | Attending: Cardiovascular Disease

## 2022-10-26 DIAGNOSIS — Z9889 Other specified postprocedural states: Secondary | ICD-10-CM | POA: Diagnosis present

## 2022-10-26 DIAGNOSIS — I4891 Unspecified atrial fibrillation: Secondary | ICD-10-CM | POA: Diagnosis not present

## 2022-10-26 DIAGNOSIS — I2510 Atherosclerotic heart disease of native coronary artery without angina pectoris: Secondary | ICD-10-CM

## 2022-10-26 DIAGNOSIS — R079 Chest pain, unspecified: Secondary | ICD-10-CM | POA: Diagnosis present

## 2022-10-26 DIAGNOSIS — Z952 Presence of prosthetic heart valve: Secondary | ICD-10-CM | POA: Diagnosis not present

## 2022-10-26 LAB — MYOCARDIAL PERFUSION IMAGING
LV dias vol: 74 mL (ref 62–150)
LV sys vol: 32 mL
Nuc Stress EF: 57 %
Peak HR: 93 {beats}/min
Rest HR: 75 {beats}/min
Rest Nuclear Isotope Dose: 10.6 mCi
SDS: 2
SRS: 0
SSS: 2
ST Depression (mm): 0 mm
Stress Nuclear Isotope Dose: 31.8 mCi
TID: 1.06

## 2022-10-26 MED ORDER — TECHNETIUM TC 99M TETROFOSMIN IV KIT
31.8000 | PACK | Freq: Once | INTRAVENOUS | Status: AC | PRN
  Administered 2022-10-26: 31.8 via INTRAVENOUS

## 2022-10-26 MED ORDER — REGADENOSON 0.4 MG/5ML IV SOLN
0.4000 mg | Freq: Once | INTRAVENOUS | Status: AC
Start: 2022-10-26 — End: 2022-10-26
  Administered 2022-10-26: 0.4 mg via INTRAVENOUS

## 2022-10-26 MED ORDER — TECHNETIUM TC 99M TETROFOSMIN IV KIT
10.6000 | PACK | Freq: Once | INTRAVENOUS | Status: AC | PRN
  Administered 2022-10-26: 10.6 via INTRAVENOUS

## 2022-11-30 ENCOUNTER — Other Ambulatory Visit: Payer: Self-pay | Admitting: Interventional Cardiology

## 2022-11-30 DIAGNOSIS — I4811 Longstanding persistent atrial fibrillation: Secondary | ICD-10-CM

## 2022-12-03 NOTE — Telephone Encounter (Signed)
Prescription refill request for Eliquis received. Indication:afib Last office visit:5/24 Scr:0.95  5/24 Age: 89 Weight:85.7  kg  Prescription refilled

## 2023-02-03 NOTE — Progress Notes (Unsigned)
Cardiology Office Note   Date:  02/04/2023   ID:  Eric [name], DOB 1929/10/10, MRN [id number]  PCP:  Aliene Beams, MD    No chief complaint on file.  CAD/AFib  Wt Readings from Last 3 Encounters:  02/04/23 184 lb 6.4 oz (83.6 kg)  10/26/22 189 lb (85.7 kg)  10/18/22 189 lb (85.7 kg)       History of Present Illness: Eric [name] is a 89 y.o. male  with severe valvular heart disease.   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   He has had an aortic valve replacement , MV repair and single vessel bypass in 2009.  He had a negative stress test in 2011. No chest pain prior to valve replacement surgery. He did have SHOB.   In late 2015, his heart rate was noted to be elevated at the time of a tooth extraction. He was found to be in atrial flutter. He underwent successful cardioversion. He notes that during the time he was out of rhythm, he had a cough. This got better after the cardioversion.   He went to [place] in February 2016 for about 3 weeks and did well on the NOAC.  Less bleeding and bruising on Eliquis compared to his experience Coumadin.  He then changed from Coumadin to Eliquis to treat multiple DVT and PE.   He had a tooth extraction years ago. When he stayed on his anticoagulation, he had significant bleeding issues at that time. THe next time, he came off of the Eliquis.   In late 2017, he had several instances of blood clots in his urine. He underwent cystoscopy in the urology clinic after holding out was for 4 days. This showed a malignancy. He had surgery and tolerated this well.   He has noticed more edema in his right leg since surgery back in 2017.  It persists.    In late 2019, he was in [place] and was volume overloaded and short of breath. He was given IV Lasix and diuresed well. He was kept overnight.  He was also found to be in AFib.  He dd not feel  Palpitations.     They were eating out all of the time.  He had stopped his  Lasix with his bladder cancer.  Echo showed normal LV function and 3+ AI.     In early 2020, He had a procedure on the left ear.  It was bleeding and it was cauterized.  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       I spoke to his PMD, Dr. Valentina Lucks in 07/2018.  His labs revealed concern for low grade hemolysis.  TEE was considered but the patient was in [place], and given the COVID outbreak, the procedure was postponed.     He required repeat surgical procedure on the squamous cell area.  He had some bleeding post procedure and required more stitches.  He required a skin graft.     TEE was done in 09/2018:  The left ventricle has low normal systolic function, with an ejection fraction of 50-55%. Left ventricular diastolic function could not be evaluated. No evidence of left ventricular regional wall motion abnormalities.  2. The right ventricle has normal systolc function. The cavity was normal. There is no increase in right ventricular wall thickness.  3. Left atrial size was mildly dilated.  4. Right atrial size was mildly dilated.  5. Moderately thickened tricuspid valve leaflets.  6. 28mm Sorin mitral valve  repair annular ring intact.  7. 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  8. A 28 Mitralflow porcine bioprosthesis valve is present in the aortic position. Echo findings are consistent with intravalvular regurgitation of the aortic prosthesis.  9. Aortic valve regurgitation is severe by color flow Doppler. Mild stenosis of the aortic valve. 10. No vegetation on the aortic valve. 11. The aortic root is normal in size and structure. 12. Previously ligated left atrial appendage. 13. Severe bioprosthetic aortic valve regurgitation.   D/w Dr. Excell Seltzer with plans for TAVR w/u.   Post TAVR f/u showed: "He was evaluated by the multidisciplinary valve team and underwent successful valve in valve TAVR with a 23 mm Edwards Sapien 3 THV via the TF approach on  11/25/18. Post operative echo showed EF 50-55%, severe MAC with mild MS, mild-mod TR and normally functioning TAVR with mean gradient 10.65mmHg and no PVL. His BP was noted to be elevated post operatively and he was started on Losartan 25mg  daily. He was discharged on home Eliquis and aspirin. Lasix decreased from 40mg  daily to 20mg  daily.    Follow up labs showed increase in creat 1.05--> 1.44. Lasix decreased to 20mg  every other day. Bilirubin was still elevated and this was sent to his PCP for review."   He has had some dental issues.  He had to have a tooth pulled in 03/2019.  Eliquis was continued.     He is in a balance class also, 2x/week.  Fell while playing pickle ball.   TAVR clinic visit in 7/21, things were stable.   Had some low BP in 10/21.  Holding losartan when BP is around 130 mm Hg. BP has been in the 110-150 range systolic since then.   CVA in 11/2020 which was from small vessel disease, while on Eliquis. Losartan has been on hold.  Aspirin was added after stroke per neuro.     Had COVID in September 2023.  Was concerned that he may have been volume overloaded but when he saw his primary care doctor, they diagnosed him with sinusitis.  Denies : Chest pain. Dizziness. Nitroglycerin use. Orthopnea. Palpitations. Paroxysmal nocturnal dyspnea. Shortness of breath. Syncope.    Has been feeling well. Had an visit to the ER in May 2024.  Sent home.   Once a week use of Lasix for LE edema.  Past Medical History:  Diagnosis Date   Atrial flutter, paroxysmal (HCC)    a. dx 04-02-2014, s/p  successful cardioversion 04-06-2014.   Chronic anticoagulation    on Eliquis--  due to recurrent dvt's and pe's   Dyslipidemia    History of bladder cancer urologist-  dr Sherryl Barters   02-13-2016  s/p TURBT per path high grade papillary urothelial carcinoma   History of DVT (deep vein thrombosis)    2000-- RLE   History of melanoma excision    left flank;  07/ 2014 left lower leg and right upper  chest   History of prostate cancer    Gleason 6--  s/p  radical prostatectomy 06/ 2000 in [place]---  no recurrence   History of pulmonary embolus (PE)    2000 & 2005  bilateral   Hx of valvuloplasty 06/19/2007   a. 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 05/2007 CABG x 1: s/p SVG-OM;  b. 10/2010 Ex MV: EF 72%, inf attenuation w/o ischemia, brief run of PAT with exercise. To   Stroke (HCC)    Thrombocytopenia (HCC)    Thyroid goiter 2013   nodular   Wears hearing aid    BILATERAL   Wears partial dentures    UPPER    Past Surgical History:  Procedure Laterality Date   AORTIC VALVE REPLACEMENT (AVR)/CORONARY ARTERY BYPASS GRAFTING (CABG)  06-19-2007  dr Laneta Simmers   SVG to OM1 ;   AVR w/ #23 Mitralflow pericardial and ligation left atrial appendage;  MV repair w/ #28 Sorin 3-D memo Ring Annuloplasty with repair ruptured chordar of P-2 flail segments   CARDIAC CATHETERIZATION  05-12-2007  dr Amil Amen   single vessel 30% ostial LAD,  80% LCx;  severe to critial AS,  moderate MR,  normal LVF, ef 70%,  normal right heart pressures and cardiac outputs,  mild elevated LV end-diastolic pressure     CARDIOVASCULAR STRESS TEST  11-02-2010  dr Eldridge Dace   normal nuclear study w/ no ischemia or infarct/scar/  normal LV function and wall motion , ef 72%   CARDIOVERSION N/A 04/06/2014   Procedure: CARDIOVERSION;  Surgeon: Lars Masson, MD;  Location: [facility] ENDOSCOPY;  Service: Cardiovascular;  Laterality: N/A;   successful   CATARACT EXTRACTION W/ INTRAOCULAR LENS  IMPLANT, BILATERAL  2012  approx   CORONARY ARTERY BYPASS GRAFT     PROSTATECTOMY     RETROPUBIC RADICAL PROSTATECTOMY  06/ 2000  in [place], [place]   RIGHT/LEFT HEART CATH AND CORONARY/GRAFT  ANGIOGRAPHY N/A 10/29/2018   Procedure: RIGHT/LEFT HEART CATH AND CORONARY/GRAFT ANGIOGRAPHY;  Surgeon: Tonny Bollman, MD;  Location: [facility] INVASIVE CV LAB;  Service: Cardiovascular;  Laterality: N/A;   ROTATOR CUFF REPAIR Left 09/1998   TEE WITHOUT CARDIOVERSION N/A 04/06/2014   Procedure: TRANSESOPHAGEAL ECHOCARDIOGRAM (TEE);  Surgeon: Lars Masson, MD;  Location: [facility] ENDOSCOPY;  Service: Cardiovascular;  Laterality: N/A;  mild focal basa LVH of the septum, ef 50-55%, s/p AV annuloplasty ring, elongated chordae, thickened leaflets, mild to mod. MR, multiple small jets/arotic bioprosthetic valve sits well in position, mild AI/ thickened TV w/ mild reurg./mild LA   TEE WITHOUT CARDIOVERSION N/A 10/22/2018   Procedure: TRANSESOPHAGEAL ECHOCARDIOGRAM (TEE);  Surgeon: Jake Bathe, MD;  Location: [facility] ENDOSCOPY;  Service: Cardiovascular;  Laterality: N/A;   TEE WITHOUT CARDIOVERSION N/A 11/25/2018   Procedure: TRANSESOPHAGEAL ECHOCARDIOGRAM (TEE);  Surgeon: Tonny Bollman, MD;  Location: [facility] OR;  Service: Open Heart Surgery;  Laterality: N/A;   TRANSCATHETER AORTIC VALVE REPLACEMENT, TRANSFEMORAL N/A 11/25/2018   Procedure: TRANSCATHETER AORTIC VALVE REPLACEMENT, TRANSFEMORAL;  Surgeon: Tonny Bollman, MD;  Location: [facility] OR;  Service: Open Heart Surgery;  Laterality: N/A;   TRANSTHORACIC ECHOCARDIOGRAM  03-23-2016   dr Eldridge Dace   EF 55-60%, reduced contribution atrial contraction to ventricular filling, due to increased ventricular diastolic pressure or atrial contratile dysfunction/  bioprosthetic AV w/ mod. regurg. (valve area 1.97cm^2)/ mild dilated ascending aorta/ mild thicken MV normal function annular ring prosthesis/severe LAE & mod. to sev.RAE/ PASP 36mmHg/ mild TR/ ventricle septal motion show paradox   TRANSURETHRAL RESECTION OF BLADDER TUMOR N/A 02/13/2016   Procedure: TRANSURETHRAL RESECTION OF BLADDER TUMOR (TURBT);  Surgeon: Hildred Laser, MD;  Location: [facility];   Service: Urology;  Laterality: N/A;   TRANSURETHRAL RESECTION OF BLADDER TUMOR N/A 09/04/2016   Procedure: TRANSURETHRAL RESECTION OF BLADDER TUMOR (TURBT);  Surgeon: Hildred Laser, MD;  Location: [facility];  Service: Urology;  Laterality: N/A;     Current Outpatient Medications  Medication Sig Dispense Refill   amoxicillin (AMOXIL) 500 MG tablet Take 4 tablets (2,000 mg total) by mouth as directed. Take 1 hour prior to dental work, including cleanings. 4 tablet 6   aspirin EC 81 MG tablet Take 81 mg by mouth daily. Swallow whole.     atorvastatin (LIPITOR) 20 MG tablet Take 40 mg by mouth every evening.     ELIQUIS 5 MG TABS tablet TAKE 1 TABLET BY MOUTH TWICE  DAILY 180 tablet 3   Eyelid Cleansers (OCUSOFT EYELID CLEANSING) PADS Place 1 application into the left eye in the morning.     ezetimibe (ZETIA) 10 MG tablet TAKE 1 TABLET BY MOUTH IN THE  EVENING 90 tablet 3   furosemide (LASIX) 20 MG tablet TAKE 1 TABLET BY MOUTH EVERY  OTHER DAY MAY TAKE 1 ADDITIONAL  DOSE IF NEEDED FOR LEG SWELLING 90 tablet 3   nitroGLYCERIN (NITROSTAT) 0.4 MG SL tablet Place 1 tablet (0.4 mg total) under the tongue every 5 (five) minutes as needed. 25 tablet 0   No current facility-administered medications for this visit.    Allergies:   Patient has no known allergies.    Social History:  The patient  reports that he has never smoked. He has never used smokeless tobacco. He reports current alcohol use of about 1.0 - 2.0 standard drink of alcohol per week. He reports that he does not use drugs.   Family History:  The patient's family history includes Leukemia in his father; Supraventricular tachycardia in his sister.    ROS:  Please see the history of present illness.   Otherwise, review of systems are positive for appetite has decreased; occasional ankle edema.   All other systems are reviewed and negative.    PHYSICAL EXAM: VS:  BP (!) 134/92   Pulse 69   Ht 6' (1.829 m)   Wt  184 lb 6.4 oz (83.6 kg)   SpO2 99%   BMI 25.01 kg/m  , BMI Body mass index is 25.01 kg/m. GEN: Well nourished, well developed, in no acute distress HEENT: normal Neck: no JVD, carotid bruits, or masses Cardiac:  RRR; no murmurs, rubs, or gallops,no edema  Respiratory:  clear to auscultation bilaterally, normal work of breathing GI: soft, nontender, nondistended, + BS MS: no deformity or atrophy Skin: warm and dry, no rash Neuro:  Strength and sensation are intact Psych: euthymic mood, full affect   EKG:   The ekg ordered 5/24 demonstrates AFib, PVC, nonspecific ST changes   Recent Labs: 10/15/2022: ALT 18; BUN 19; Creatinine, Ser 0.95; Hemoglobin 15.3; Platelets 162; Potassium 3.8; Sodium 138   Lipid Panel    Component Value Date/Time   CHOL 127 11/25/2020 0258   CHOL 130 04/27/2019 0840   TRIG 57 11/25/2020 0258   HDL 50 11/25/2020 0258   HDL 63 04/27/2019 0840   CHOLHDL 2.5 11/25/2020 0258   VLDL 11 11/25/2020 0258   LDLCALC 66 11/25/2020 0258   LDLCALC 53 04/27/2019 0840     Other studies Reviewed: Additional studies/ records that were reviewed today with results demonstrating: LDL 48 in 07/2022.   ASSESSMENT AND PLAN:  A-fib: CVA in 2022 while on Eliquis.  Aspirin was added.  Certainly at high risk for bleeding given his age.  Need to monitor for bleeding issues. Status post TAVR: Continue SBE prophylaxis. CAD:  Continue aggressive secondary prevention.  Whole food, plant-based diet.  Lipids well-controlled. Chronic diastolic heart failure: Low-salt diet.  Avoid processed foods.  We have decreased furosemide to every other day in the past as he seems to be staying euvolemic. Prior DVT: On Eliquis.  Needs to avoid falling. Hypertension: The current medical regimen is effective;  continue present plan and medications.  Readings at home have been good when checked.  We spoke about checking 1-2 times per week when he is resting.  We also talked about avoiding falls.  I  am hesitant to add any medication to lower blood pressure as I do not want to increase his risk of falls.   Current medicines are reviewed at length with the patient today.  The patient concerns regarding his medicines were addressed.  The following changes have been made:  No change  Labs/ tests ordered today include:  No orders of the defined types were placed in this encounter.   Recommend 150 minutes/week of aerobic exercise Low fat, low carb, high fiber diet recommended  Disposition:   FU in 6 with Dr. Erlinda Hong, [place], AT   Signed, Lance Muss, MD  02/04/2023 10:00 AM    [facility] [address] [place], [place], [place]  [address] Phone: [phone]; Fax: [phone]

## 2023-02-04 ENCOUNTER — Ambulatory Visit: Payer: Medicare Other | Attending: Interventional Cardiology | Admitting: Interventional Cardiology

## 2023-02-04 ENCOUNTER — Encounter: Payer: Self-pay | Admitting: Interventional Cardiology

## 2023-02-04 VITALS — BP 134/92 | HR 69 | Ht 72.0 in | Wt 184.4 lb

## 2023-02-04 DIAGNOSIS — I4891 Unspecified atrial fibrillation: Secondary | ICD-10-CM

## 2023-02-04 DIAGNOSIS — Z9889 Other specified postprocedural states: Secondary | ICD-10-CM

## 2023-02-04 DIAGNOSIS — I5032 Chronic diastolic (congestive) heart failure: Secondary | ICD-10-CM | POA: Diagnosis not present

## 2023-02-04 DIAGNOSIS — I10 Essential (primary) hypertension: Secondary | ICD-10-CM

## 2023-02-04 DIAGNOSIS — Z952 Presence of prosthetic heart valve: Secondary | ICD-10-CM

## 2023-02-04 DIAGNOSIS — I25118 Atherosclerotic heart disease of native coronary artery with other forms of angina pectoris: Secondary | ICD-10-CM

## 2023-02-04 MED ORDER — NITROGLYCERIN 0.4 MG SL SUBL
0.4000 mg | SUBLINGUAL_TABLET | SUBLINGUAL | 3 refills | Status: DC | PRN
Start: 2023-02-04 — End: 2024-04-01

## 2023-02-04 NOTE — Patient Instructions (Addendum)
Medication Instructions:  Your physician recommends that you continue on your current medications as directed. Please refer to the Current Medication list given to you today.  *If you need a refill on your cardiac medications before your next appointment, please call your pharmacy*   Lab Work: none If you have labs (blood work) drawn today and your tests are completely normal, you will receive your results only by: MyChart Message (if you have MyChart) OR A paper copy in the mail If you have any lab test that is abnormal or we need to change your treatment, we will call you to review the results.   Testing/Procedures: none   Follow-Up: At [facility], you and your health needs are our priority.  As part of our continuing mission to provide you with exceptional heart care, we have created designated Provider Care Teams.  These Care Teams include your primary Cardiologist (physician) and Advanced Practice Providers (APPs -  Physician Assistants and Nurse Practitioners) who all work together to provide you with the care you need, when you need it.  We recommend signing up for the patient portal called "MyChart".  Sign up information is provided on this After Visit Summary.  MyChart is used to connect with patients for Virtual Visits (Telemedicine).  Patients are able to view lab/test results, encounter notes, upcoming appointments, etc.  Non-urgent messages can be sent to your provider as well.   To learn more about what you can do with MyChart, go to [email or web address].    Your next appointment:   August 06, 2023 at 10:00  Provider:   Dr Excell Seltzer     Other Instructions

## 2023-02-23 ENCOUNTER — Other Ambulatory Visit: Payer: Self-pay | Admitting: Interventional Cardiology

## 2023-03-13 ENCOUNTER — Encounter: Payer: Self-pay | Admitting: Surgery

## 2023-03-13 ENCOUNTER — Institutional Professional Consult (permissible substitution): Payer: Medicare Other | Admitting: Surgery

## 2023-03-13 VITALS — BP 150/84 | HR 58 | Resp 18 | Ht 72.0 in | Wt 180.0 lb

## 2023-03-13 DIAGNOSIS — I712 Thoracic aortic aneurysm, without rupture, unspecified: Secondary | ICD-10-CM

## 2023-03-13 NOTE — Progress Notes (Signed)
HPI:  The patient is a 89 year old gentleman who underwent aortic valve replacement with a 23 mm Mitroflow pericardial valve, mitral valve repair with annuloplasty and single-vessel coronary bypass in 2009 by me. He has a history of paroxysmal atrial fib/flutter and recurrent DVT and PE treated with Eliquis, prostate cancer status post radical prostatectomy in 2000, and bladder cancer status post TURBT in 2017.  He developed congestive heart failure in 2019 and was diagnosed with moderate central prosthetic valve aortic insufficiency which worsened to severe in 2020.  A TEE in June 2020 showed severe prosthetic aortic insufficiency with a mean gradient across the aortic valve prosthesis of 15 mmHg.  Ejection fraction was 50 to 55%.  He subsequently underwent valve in valve TAVR using a 23 mm SAPIEN 3 valve by Dr. Excell Seltzer and myself on 11/25/2018.  He had a stroke in July 2022 and aspirin 81 mg was added to his regimen.  He presented in May 2024 with left-sided chest discomfort and ruled out for myocardial infarction.  He had a CTA of the chest to rule out pulmonary embolism which was negative but did show a 4.4 cm fusiform ascending aortic aneurysm.  He subsequently underwent a Lexiscan stress test on 10/26/2022 which was negative for ischemia or infarction.  Left ventricular ejection fraction was 57%.  He has been followed by Dr. Tracie Harrier for his primary medical care and saw her recently and was referred for evaluation of the ascending aortic aneurysm.  He has been feeling well overall and attending physical therapy.  His wife is with him today.  Current Outpatient Medications  Medication Sig Dispense Refill   amoxicillin (AMOXIL) 500 MG tablet Take 4 tablets (2,000 mg total) by mouth as directed. Take 1 hour prior to dental work, including cleanings. 4 tablet 6   aspirin EC 81 MG tablet Take 81 mg by mouth daily. Swallow whole.     atorvastatin (LIPITOR) 20 MG tablet Take 40 mg by mouth every evening.      ELIQUIS 5 MG TABS tablet TAKE 1 TABLET BY MOUTH TWICE  DAILY 180 tablet 3   Eyelid Cleansers (OCUSOFT EYELID CLEANSING) PADS Place 1 application into the left eye in the morning.     ezetimibe (ZETIA) 10 MG tablet TAKE 1 TABLET BY MOUTH IN THE  EVENING 90 tablet 3   furosemide (LASIX) 20 MG tablet TAKE 1 TABLET BY MOUTH EVERY  OTHER DAY MAY TAKE 1 ADDITIONAL  DOSE IF NEEDED FOR LEG SWELLING 90 tablet 3   nitroGLYCERIN (NITROSTAT) 0.4 MG SL tablet Place 1 tablet (0.4 mg total) under the tongue every 5 (five) minutes as needed. 25 tablet 3   No current facility-administered medications for this visit.     Physical Exam: BP (!) 150/84 (BP Location: Left Arm, Patient Position: Sitting)   Pulse (!) 58   Resp 18   Ht 6' (1.829 m)   Wt 180 lb (81.6 kg)   SpO2 96% Comment: RA  BMI 24.41 kg/m  He looks well. Cardiac exam shows an irregular rate and rhythm with normal heart sounds.  There is no murmur. Lungs are clear. There is no peripheral edema.   Diagnostic Tests:  Narrative & Impression  CLINICAL DATA:  Pulmonary embolism (PE) suspected, high prob   Chest pain.   EXAM: CT ANGIOGRAPHY CHEST WITH CONTRAST   TECHNIQUE: Multidetector CT imaging of the chest was performed using the standard protocol during bolus administration of intravenous contrast. Multiplanar CT image reconstructions and MIPs were  obtained to evaluate the vascular anatomy.   RADIATION DOSE REDUCTION: This exam was performed according to the departmental dose-optimization program which includes automated exposure control, adjustment of the mA and/or kV according to patient size and/or use of iterative reconstruction technique.   CONTRAST:  75mL OMNIPAQUE IOHEXOL 350 MG/ML SOLN   COMPARISON:  Radiograph earlier today. Report from chest CTA 11/03/2018, images not available.   FINDINGS: Cardiovascular: There are no filling defects within the pulmonary arteries to suggest pulmonary embolus. Prior TAVR and  CABG. Contrast refluxes into the hepatic veins and IVC. Aortic atherosclerosis. Fusiform dilatation of the ascending aorta, 4.4 cm. The heart is normal in size. No pericardial effusion. Mitral annulus calcifications.   Mediastinum/Nodes: No mediastinal or hilar adenopathy. 2.7 cm right thyroid nodule, unchanged in size from 2020 exams report, and more remote exams. Stability for greater than 5 years implies benignity; no biopsy or followup indicated (ref: J Am Coll Radiol. 2015 Feb;12(2): 143-50). No esophageal wall thickening.   Lungs/Pleura: No acute airspace disease. No pleural effusion. There is central bronchial thickening. Mild subpleural reticulation and ground-glass attenuation throughout both lungs with a basilar predominant distribution, also described on prior exam. No pulmonary mass.   Upper Abdomen: No acute findings.   Musculoskeletal: Prior median sternotomy. Thoracic spondylosis with anterior spurring. Chronic bilateral shoulder arthropathy. There are no acute or suspicious osseous abnormalities.   Review of the MIP images confirms the above findings.   IMPRESSION: 1. No pulmonary embolus. 2. Contrast refluxes into the hepatic veins and IVC, suggesting elevated right heart pressures. 3. Bronchial thickening. 4. Mild subpleural reticulation and ground-glass attenuation throughout both lungs with a basilar predominant distribution, also described on prior exam. This may represent interstitial lung disease, however nonspecific. This could be further assessed with elective outpatient high-resolution chest CT as clinically indicated. 5. Fusiform dilatation of the ascending aorta, 4.4 cm. Consensus guidelines recommend annual imaging follow-up, giving consideration for patient's advanced age.   Aortic Atherosclerosis (ICD10-I70.0).     Electronically Signed   By: Narda Rutherford M.D.   On: 10/15/2022 17:32    Impression:  This 89 year old gentleman has a  4.4 cm fusiform ascending aortic aneurysm.  I have personally reviewed his CTA of the chest from 2020 and the ascending aorta at the same level measured 3.9 cm.  His aneurysm is still considered small and well below the surgical threshold of 5.5 cm.  I reviewed the CTA images with him and his wife and answered their questions.  I stressed the importance of continued good blood pressure control in preventing further enlargement and acute aortic dissection.  Given his advanced age, comorbidities and prior heart surgery I do not think he would be a candidate for surgical treatment even if this was to enlarge to 5.5 cm.  He and his wife understand that.  I do not think there is a need to continue doing periodic CT scans in this case.  He just needs to continue following his blood pressure to be sure that it stays under reasonable control, usually less than 130/80.  I think the likelihood that he will have problems with this aneurysm in his lifetime would be extremely low.  Plan:  He will continue to follow-up with his PCP and cardiology.  I will be happy to see him back if the need arises.  I spent 20 minutes performing this established patient evaluation and > 50% of this time was spent face to face counseling and coordinating the care of this patient's  aortic aneurysm.    Alleen Borne, MD [facility] [phone]

## 2023-06-24 ENCOUNTER — Telehealth: Payer: Self-pay | Admitting: Cardiovascular Disease

## 2023-06-24 ENCOUNTER — Ambulatory Visit: Payer: Medicare Other | Attending: Cardiology | Admitting: Cardiology

## 2023-06-24 ENCOUNTER — Encounter: Payer: Self-pay | Admitting: Cardiology

## 2023-06-24 VITALS — BP 108/68 | HR 149 | Resp 16 | Ht 72.0 in | Wt 193.0 lb

## 2023-06-24 DIAGNOSIS — R0609 Other forms of dyspnea: Secondary | ICD-10-CM

## 2023-06-24 DIAGNOSIS — I2510 Atherosclerotic heart disease of native coronary artery without angina pectoris: Secondary | ICD-10-CM

## 2023-06-24 DIAGNOSIS — Z952 Presence of prosthetic heart valve: Secondary | ICD-10-CM

## 2023-06-24 DIAGNOSIS — I4821 Permanent atrial fibrillation: Secondary | ICD-10-CM

## 2023-06-24 MED ORDER — DILTIAZEM HCL 30 MG PO TABS
30.0000 mg | ORAL_TABLET | Freq: Three times a day (TID) | ORAL | 2 refills | Status: DC
Start: 2023-06-24 — End: 2023-09-10

## 2023-06-24 MED ORDER — METOPROLOL TARTRATE 25 MG PO TABS
25.0000 mg | ORAL_TABLET | Freq: Two times a day (BID) | ORAL | 2 refills | Status: DC
Start: 2023-06-24 — End: 2023-07-02

## 2023-06-24 NOTE — Telephone Encounter (Signed)
   Pt c/o Shortness Of Breath: STAT if SOB developed within the last 24 hours or pt is noticeably SOB on the phone  1. Are you currently SOB (can you hear that pt is SOB on the phone)? No   2. How long have you been experiencing SOB? About a week   3. Are you SOB when sitting or when up moving around? Moving around (going up the stairs)   4. Are you currently experiencing any other symptoms? no

## 2023-06-24 NOTE — Patient Instructions (Addendum)
Medication Instructions:  Your physician has recommended you make the following change in your medication:  Start diltiazem 30 mg three times daily  Start metoprolol tartrate 25 mg by mouth twice daily   *If you need a refill on your cardiac medications before your next appointment, please call your pharmacy*   Lab Work: Have CBC and CMET done today at [facility] on the first floor If you have labs (blood work) drawn today and your tests are completely normal, you will receive your results only by: MyChart Message (if you have MyChart) OR A paper copy in the mail If you have any lab test that is abnormal or we need to change your treatment, we will call you to review the results.   Testing/Procedures: Your physician has requested that you have an echocardiogram in 2-3 weeks. . Echocardiography is a painless test that uses sound waves to create images of your heart. It provides your doctor with information about the size and shape of your heart and how well your heart's chambers and valves are working. This procedure takes approximately one hour. There are no restrictions for this procedure. Please do NOT wear cologne, perfume, aftershave, or lotions (deodorant is allowed). Please arrive 15 minutes prior to your appointment time.  Please note: We ask at that you not bring children with you during ultrasound (echo/ vascular) testing. Due to room size and safety concerns, children are not allowed in the ultrasound rooms during exams. Our front office staff cannot provide observation of children in our lobby area while testing is being conducted. An adult accompanying a patient to their appointment will only be allowed in the ultrasound room at the discretion of the ultrasound technician under special circumstances. We apologize for any inconvenience.    Follow-Up: At [facility], you and your health needs are our priority.  As part of our continuing mission to provide you with  exceptional heart care, we have created designated Provider Care Teams.  These Care Teams include your primary Cardiologist (physician) and Advanced Practice Providers (APPs -  Physician Assistants and Nurse Practitioners) who all work together to provide you with the care you need, when you need it.  We recommend signing up for the patient portal called "MyChart".  Sign up information is provided on this After Visit Summary.  MyChart is used to connect with patients for Virtual Visits (Telemedicine).  Patients are able to view lab/test results, encounter notes, upcoming appointments, etc.  Non-urgent messages can be sent to your provider as well.   To learn more about what you can do with MyChart, go to [email or web address].    Your next appointment:   1 week(s)  Provider:   You will follow up in the Atrial Fibrillation Clinic located at [facility]. Your provider will be: Clint R. Fenton, PA-C or Lake Bells, PA-C    Other Instructions Please schedule appointment with Dr Jacinto Halim for about 1 month from now

## 2023-06-24 NOTE — Telephone Encounter (Signed)
Called patient's wife about message. Patient has been SOB with activity for the last week. Patient has a history of two aortic valve surgeries. Patient's wife is worried that the SOB is related to his aortic valve. Will have patient see DOD, Dr. Jacinto Halim today.

## 2023-06-24 NOTE — Progress Notes (Signed)
Cardiology Office Note:  .   Date:  06/24/2023  ID:  Eric [name], DOB [id number], MRN [id number] PCP: Aliene Beams, MD  [facility] Providers Cardiologist:  Tonny Bollman, MD   History of Present Illness: .   Eric [name] is a 89 y.o. Caucasian male patient who is a nonagenarian with bioprosthetic aortic valve replacement along with SVG to OM1 in 2009.  Presented with atypical atrial flutter SP cardioversion in 2015 and again in 2019 when he presented with heart failure and A-fib in [place] along with severe AI.  Underwent successful TAVR with placement of a 23 mm Edwards SAPIEN 3 THV on 11/25/2018.  Ascending aortic aneurysm, CT scan 10/15/2022 revealing 4.5 cm fusiform dilatation.  He has been in persistent atrial fibrillation and remains asymptomatic.  This is working for worsening dyspnea on exertion.  Discussed the use of AI scribe software for clinical note transcription with the patient, who gave verbal consent to proceed.  History of Present Illness   He is a 89 year old male with atrial fibrillation who presents with worsening shortness of breath and palpitations. He is accompanied by his wife and son, Eric [name].  He has been experiencing significant shortness of breath when climbing stairs over the past 20 days. This symptom was previously noted a year or two ago, leading to a valve replacement in 2020, which initially resolved the issue. However, the shortness of breath has returned. He describes a sensation of his heart 'pounding fast' when he sits down after exertion, such as climbing stairs.  He has a history of an ascending aorta aneurysm, which was noted to have increased in size from 3.9 cm in 2020 to 4.4 cm recently. A CT scan on Oct 15, 2022, revealed a 4.5 cm fusiform dilatation, evaluated by CT surgery and not felt to be a candidate for any further intervention given his advanced age.  He has a history of bladder cancer, which is currently under control. He  recently underwent a checkup at [facility], where a urine specimen was taken for analysis.  No black stools, blood in the stool, or blood in the urine.      Labs   Lab Results  Component Value Date   NA 138 10/15/2022   K 3.8 10/15/2022   CO2 24 10/15/2022   GLUCOSE 108 (H) 10/15/2022   BUN 19 10/15/2022   CREATININE 0.95 10/15/2022   CALCIUM 9.1 10/15/2022   GFR 92.73 12/07/2014   EGFR 73 12/25/2021   GFRNONAA >60 10/15/2022      Latest Ref Rng & Units 10/15/2022    2:08 PM 12/25/2021    4:05 PM 06/14/2021    1:05 PM  BMP  Glucose 70 - 99 mg/dL 782  80  81   BUN 8 - 23 mg/dL 19  18  16    Creatinine 0.61 - 1.24 mg/dL 9.56  2.13  0.86   BUN/Creat Ratio 10 - 24  18  16    Sodium 135 - 145 mmol/L 138  142  138   Potassium 3.5 - 5.1 mmol/L 3.8  5.0  4.5   Chloride 98 - 111 mmol/L 107  99  103   CO2 22 - 32 mmol/L 24  27  24    Calcium 8.9 - 10.3 mg/dL 9.1  9.5  9.7       Latest Ref Rng & Units 10/15/2022    1:25 PM 02/26/2022    9:58 AM 06/14/2021    1:05 PM  CBC  WBC 4.0 - 10.5 K/uL 4.9  5.5  5.0   Hemoglobin 13.0 - 17.0 g/dL 65.7  84.6  96.2   Hematocrit 39.0 - 52.0 % 46.4  42.4  45.9   Platelets 150 - 400 K/uL 162  153  161    External Labs:  PCP Labs 08/07/2022:  Total cholesterol 126, triglycerides 90, HDL 61, LDL-C 48.  Labs 08/07/2022:  TSH normal at 3.970.  Review of Systems  Cardiovascular:  Positive for dyspnea on exertion and palpitations. Negative for chest pain, leg swelling, orthopnea, paroxysmal nocturnal dyspnea and syncope.    Physical Exam:   VS:  BP 108/68 (BP Location: Left Arm, Patient Position: Sitting, Cuff Size: Normal)   Pulse (!) 149   Resp 16   Ht 6' (1.829 m)   Wt 193 lb (87.5 kg)   SpO2 97%   BMI 26.18 kg/m    Wt Readings from Last 3 Encounters:  06/24/23 193 lb (87.5 kg)  03/13/23 180 lb (81.6 kg)  02/04/23 184 lb 6.4 oz (83.6 kg)    Physical Exam Neck:     Vascular: No carotid bruit or JVD.  Cardiovascular:     Rate and Rhythm:  Tachycardia present. Rhythm irregularly irregular.     Pulses: Normal pulses and intact distal pulses.     Heart sounds: No murmur heard. Pulmonary:     Effort: Pulmonary effort is normal.     Breath sounds: Normal breath sounds.  Abdominal:     General: Bowel sounds are normal.     Palpations: Abdomen is soft.  Musculoskeletal:     Right lower leg: No edema.     Left lower leg: No edema.  Skin:    Capillary Refill: Capillary refill takes less than 2 seconds.     Studies Reviewed: Marland Kitchen    ECHOCARDIOGRAM COMPLETE BUBBLE STUDY 11/25/2020  1. Left ventricular ejection fraction, by estimation, is 55 to 60%. The left ventricle has normal function. The left ventricle has no regional wall motion abnormalities. There is mild left ventricular hypertrophy. Left ventricular diastolic parameters are indeterminate. 2. Right ventricular systolic function is mildly reduced. The right ventricular size is moderately enlarged. There is normal pulmonary artery systolic pressure. The estimated right ventricular systolic pressure is 29.3 mmHg. 3. Left atrial size was mildly dilated. 4. Right atrial size was moderately dilated. 5. The mitral valve has been repaired/replaced. No evidence of mitral valve regurgitation. 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 The mean mitral valve gradient is 4.0 mmHg at 56bpm 6. The aortic valve has been repaired/replaced. Aortic valve regurgitation is trivial. 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 MG 7 mmHg. Echo findings are consistent with normal structure and function of the aortic valve prosthesis. 7. Aortic dilatation noted. There is mild dilatation of the ascending aorta, measuring 39 mm. 8. The inferior vena cava is dilated in size with >50% respiratory variability, suggesting right atrial pressure of 8 mmHg. 9. Agitated saline contrast bubble study was negative, with no evidence of  any interatrial shunt.  CT angiogram chest 10/15/2022: 1. Mild subpleural reticulation and ground-glass attenuation throughout both lungs with a basilar predominant distribution, also described on prior exam. This may represent interstitial lung disease, however nonspecific. This could be further assessed with elective outpatient high-resolution chest CT as clinically indicated. 2. Fusiform dilatation of the ascending aorta, 4.4 c  EKG:    EKG Interpretation Date/Time:  Monday June 24 2023 15:35:22 EST Ventricular Rate:  149 PR Interval:    QRS Duration:  86 QT Interval:  296 QTC Calculation: 466 R Axis:   44  Text Interpretation: EKG 06/24/2023: Atrial fibrillation with rapid ventricular response at the rate of 149 bpm, normal axis, anteroseptal infarct 4.  No evidence of ischemia.  Compared to 10/15/2022, rapid ventricular response is new. Confirmed by Delrae Rend [phone]) on 06/24/2023 3:58:20 PM    Medications and allergies    No Known Allergies   Current Outpatient Medications:    amoxicillin (AMOXIL) 500 MG tablet, Take 4 tablets (2,000 mg total) by mouth as directed. Take 1 hour prior to dental work, including cleanings., Disp: 4 tablet, Rfl: 6   aspirin EC 81 MG tablet, Take 81 mg by mouth daily. Swallow whole., Disp: , Rfl:    atorvastatin (LIPITOR) 20 MG tablet, Take 40 mg by mouth every evening., Disp: , Rfl:    diltiazem (CARDIZEM) 30 MG tablet, Take 1 tablet (30 mg total) by mouth 3 (three) times daily., Disp: 90 tablet, Rfl: 2   ELIQUIS 5 MG TABS tablet, TAKE 1 TABLET BY MOUTH TWICE  DAILY, Disp: 180 tablet, Rfl: 3   Eyelid Cleansers (OCUSOFT EYELID CLEANSING) PADS, Place 1 application into the left eye in the morning., Disp: , Rfl:    ezetimibe (ZETIA) 10 MG tablet, TAKE 1 TABLET BY MOUTH IN THE  EVENING, Disp: 90 tablet, Rfl: 3   furosemide (LASIX) 20 MG tablet, TAKE 1 TABLET BY MOUTH EVERY  OTHER DAY MAY TAKE 1 ADDITIONAL  DOSE IF NEEDED FOR LEG SWELLING,  Disp: 90 tablet, Rfl: 3   metoprolol tartrate (LOPRESSOR) 25 MG tablet, Take 1 tablet (25 mg total) by mouth 2 (two) times daily., Disp: 60 tablet, Rfl: 2   nitroGLYCERIN (NITROSTAT) 0.4 MG SL tablet, Place 1 tablet (0.4 mg total) under the tongue every 5 (five) minutes as needed., Disp: 25 tablet, Rfl: 3   ASSESSMENT AND PLAN: .      ICD-10-CM   1. Dyspnea on exertion  R06.09 EKG 12-Lead    CBC    Comp Met (CMET)    ECHOCARDIOGRAM COMPLETE    2. Coronary artery disease involving native coronary artery of native heart without angina pectoris  I25.10 CBC    Comp Met (CMET)    ECHOCARDIOGRAM COMPLETE    3. History of transcatheter aortic valve replacement (TAVR)  23 mm Edwards SAPIEN 3 THV on 11/25/2018.  Z95.2 CBC    Comp Met (CMET)    ECHOCARDIOGRAM COMPLETE    4. Permanent atrial fibrillation (HCC)  I48.21 metoprolol tartrate (LOPRESSOR) 25 MG tablet    diltiazem (CARDIZEM) 30 MG tablet    CBC    Comp Met (CMET)    ECHOCARDIOGRAM COMPLETE     Click Here to Calculate/Change CHADS2VASc Score The patient's CHADS2-VASc score is 6, indicating a 9.7% annual risk of stroke.  Therefore, anticoagulation is recommended.   CHF History: No HTN History: No Diabetes History: No Stroke History: Yes Vascular Disease History: Yes   Assessment and Plan    Atrial Fibrillation with Rapid Ventricular Response Presents with worsening AFib symptoms, heart rate 140 bpm, shortness of breath, and palpitations post-exertion. On Eliquis and aspirin. Rapid ventricular response likely contributing to symptoms. Discussed risks of uncontrolled AFib, including heart failure and stroke, and the need for rate control. Cardioversion not an option due to persistent AFib since 2020. Discussed potential bradycardia with new medications and importance of heart rate monitoring. Need for follow-up  and echocardiogram to assess heart function. - Prescribe metoprolol tartrate 25 mg BID - Prescribe diltiazem 30 mg TID -  Schedule follow-up in AFib clinic in one week - Order echocardiogram in 2-3 weeks - Schedule follow-up appointment with me in one month - Order CBC and MET today - Educate family on pulse checking and bradycardia monitoring -If heart rate control is improved, could consider discontinuing either diltiazem or metoprolol in view of high chances of developing heart block and the patient was a nonagenarian.  Repeat EKG needed. -In 2020 there was question about TIA, was also started on aspirin and Eliquis was continued, could consider discontinuing aspirin to reduce risk of bleeding.  Ascending Aortic Aneurysm Known ascending aortic aneurysm, increased from 3.9 cm in 2020 to 4.4 cm on recent imaging. Noted during CT scan post-suspected stroke. Surgical intervention not recommended due to age and overall health.  No further surveillance or therapy indicated, however adding a beta-blocker would be appropriate.  Could consider addition of an ARB however his blood pressure is very soft.  CAD with one-vessel CABG during aortic valve replacement in 2009, no recurrence of angina, in spite of heart rate being 140 bpm, no chest pain, no ischemic EKG changes.    Signed,  Yates Decamp, MD, [facility] 06/24/2023, 5:08 PM [facility] [address] [place], [place] [address] Phone: [phone]. Fax:  [phone]

## 2023-06-25 ENCOUNTER — Encounter: Payer: Self-pay | Admitting: Cardiology

## 2023-06-25 LAB — CBC
Hematocrit: 41.8 % (ref 37.5–51.0)
Hemoglobin: 14 g/dL (ref 13.0–17.7)
MCH: 31.8 pg (ref 26.6–33.0)
MCHC: 33.5 g/dL (ref 31.5–35.7)
MCV: 95 fL (ref 79–97)
Platelets: 180 10*3/uL (ref 150–450)
RBC: 4.4 x10E6/uL (ref 4.14–5.80)
RDW: 11.8 % (ref 11.6–15.4)
WBC: 7 10*3/uL (ref 3.4–10.8)

## 2023-06-25 LAB — COMPREHENSIVE METABOLIC PANEL
ALT: 25 [IU]/L (ref 0–44)
AST: 32 [IU]/L (ref 0–40)
Albumin: 3.9 g/dL (ref 3.6–4.6)
Alkaline Phosphatase: 55 [IU]/L (ref 44–121)
BUN/Creatinine Ratio: 20 (ref 10–24)
BUN: 21 mg/dL (ref 10–36)
Bilirubin Total: 4.2 mg/dL — ABNORMAL HIGH (ref 0.0–1.2)
CO2: 21 mmol/L (ref 20–29)
Calcium: 9.1 mg/dL (ref 8.6–10.2)
Chloride: 105 mmol/L (ref 96–106)
Creatinine, Ser: 1.04 mg/dL (ref 0.76–1.27)
Globulin, Total: 2.6 g/dL (ref 1.5–4.5)
Glucose: 96 mg/dL (ref 70–99)
Potassium: 4.4 mmol/L (ref 3.5–5.2)
Sodium: 139 mmol/L (ref 134–144)
Total Protein: 6.5 g/dL (ref 6.0–8.5)
eGFR: 68 mL/min/{1.73_m2} (ref >59)

## 2023-06-25 NOTE — Progress Notes (Signed)
Normal blood counts and renal function

## 2023-06-29 ENCOUNTER — Inpatient Hospital Stay (HOSPITAL_COMMUNITY)
Admission: EM | Admit: 2023-06-29 | Discharge: 2023-07-02 | DRG: 291 | Disposition: A | Discharge status: Home / Self Care | Payer: Medicare Other | Attending: Internal Medicine | Admitting: Internal Medicine

## 2023-06-29 ENCOUNTER — Other Ambulatory Visit: Payer: Self-pay

## 2023-06-29 ENCOUNTER — Emergency Department (HOSPITAL_COMMUNITY): Payer: Medicare Other

## 2023-06-29 ENCOUNTER — Telehealth: Payer: Self-pay | Admitting: Cardiology

## 2023-06-29 ENCOUNTER — Encounter (HOSPITAL_COMMUNITY): Payer: Self-pay

## 2023-06-29 DIAGNOSIS — Z66 Do not resuscitate: Secondary | ICD-10-CM | POA: Diagnosis present

## 2023-06-29 DIAGNOSIS — Z1152 Encounter for screening for COVID-19: Secondary | ICD-10-CM

## 2023-06-29 DIAGNOSIS — Z8551 Personal history of malignant neoplasm of bladder: Secondary | ICD-10-CM

## 2023-06-29 DIAGNOSIS — E8770 Fluid overload, unspecified: Secondary | ICD-10-CM | POA: Diagnosis not present

## 2023-06-29 DIAGNOSIS — Z86718 Personal history of other venous thrombosis and embolism: Secondary | ICD-10-CM | POA: Diagnosis not present

## 2023-06-29 DIAGNOSIS — Z79899 Other long term (current) drug therapy: Secondary | ICD-10-CM | POA: Diagnosis not present

## 2023-06-29 DIAGNOSIS — T501X6A Underdosing of loop [high-ceiling] diuretics, initial encounter: Secondary | ICD-10-CM | POA: Diagnosis present

## 2023-06-29 DIAGNOSIS — I5033 Acute on chronic diastolic (congestive) heart failure: Secondary | ICD-10-CM | POA: Diagnosis present

## 2023-06-29 DIAGNOSIS — I484 Atypical atrial flutter: Secondary | ICD-10-CM | POA: Diagnosis present

## 2023-06-29 DIAGNOSIS — Z85820 Personal history of malignant melanoma of skin: Secondary | ICD-10-CM | POA: Diagnosis not present

## 2023-06-29 DIAGNOSIS — E785 Hyperlipidemia, unspecified: Secondary | ICD-10-CM | POA: Diagnosis present

## 2023-06-29 DIAGNOSIS — Z961 Presence of intraocular lens: Secondary | ICD-10-CM | POA: Diagnosis present

## 2023-06-29 DIAGNOSIS — B9620 Unspecified Escherichia coli [E. coli] as the cause of diseases classified elsewhere: Secondary | ICD-10-CM | POA: Diagnosis present

## 2023-06-29 DIAGNOSIS — Z9841 Cataract extraction status, right eye: Secondary | ICD-10-CM

## 2023-06-29 DIAGNOSIS — Z9842 Cataract extraction status, left eye: Secondary | ICD-10-CM

## 2023-06-29 DIAGNOSIS — I2510 Atherosclerotic heart disease of native coronary artery without angina pectoris: Secondary | ICD-10-CM | POA: Diagnosis present

## 2023-06-29 DIAGNOSIS — Z951 Presence of aortocoronary bypass graft: Secondary | ICD-10-CM

## 2023-06-29 DIAGNOSIS — I4821 Permanent atrial fibrillation: Secondary | ICD-10-CM | POA: Diagnosis present

## 2023-06-29 DIAGNOSIS — Z91148 Patient's other noncompliance with medication regimen for other reason: Secondary | ICD-10-CM

## 2023-06-29 DIAGNOSIS — I4891 Unspecified atrial fibrillation: Secondary | ICD-10-CM | POA: Diagnosis not present

## 2023-06-29 DIAGNOSIS — R3589 Other polyuria: Secondary | ICD-10-CM

## 2023-06-29 DIAGNOSIS — J90 Pleural effusion, not elsewhere classified: Secondary | ICD-10-CM | POA: Diagnosis present

## 2023-06-29 DIAGNOSIS — Z974 Presence of external hearing-aid: Secondary | ICD-10-CM

## 2023-06-29 DIAGNOSIS — I7121 Aneurysm of the ascending aorta, without rupture: Secondary | ICD-10-CM | POA: Diagnosis present

## 2023-06-29 DIAGNOSIS — E8721 Acute metabolic acidosis: Secondary | ICD-10-CM | POA: Diagnosis present

## 2023-06-29 DIAGNOSIS — Z953 Presence of xenogenic heart valve: Secondary | ICD-10-CM | POA: Diagnosis not present

## 2023-06-29 DIAGNOSIS — M7989 Other specified soft tissue disorders: Secondary | ICD-10-CM | POA: Diagnosis present

## 2023-06-29 DIAGNOSIS — N390 Urinary tract infection, site not specified: Secondary | ICD-10-CM | POA: Diagnosis present

## 2023-06-29 DIAGNOSIS — E8779 Other fluid overload: Secondary | ICD-10-CM | POA: Diagnosis not present

## 2023-06-29 DIAGNOSIS — Z7901 Long term (current) use of anticoagulants: Secondary | ICD-10-CM

## 2023-06-29 DIAGNOSIS — E872 Acidosis, unspecified: Secondary | ICD-10-CM

## 2023-06-29 DIAGNOSIS — J9601 Acute respiratory failure with hypoxia: Principal | ICD-10-CM | POA: Diagnosis present

## 2023-06-29 DIAGNOSIS — Z8673 Personal history of transient ischemic attack (TIA), and cerebral infarction without residual deficits: Secondary | ICD-10-CM

## 2023-06-29 DIAGNOSIS — Z9079 Acquired absence of other genital organ(s): Secondary | ICD-10-CM

## 2023-06-29 DIAGNOSIS — Z86711 Personal history of pulmonary embolism: Secondary | ICD-10-CM

## 2023-06-29 DIAGNOSIS — Z7982 Long term (current) use of aspirin: Secondary | ICD-10-CM

## 2023-06-29 DIAGNOSIS — J9811 Atelectasis: Secondary | ICD-10-CM | POA: Diagnosis present

## 2023-06-29 DIAGNOSIS — E876 Hypokalemia: Secondary | ICD-10-CM | POA: Diagnosis not present

## 2023-06-29 DIAGNOSIS — Z8546 Personal history of malignant neoplasm of prostate: Secondary | ICD-10-CM

## 2023-06-29 LAB — CBC
HCT: 44.4 % (ref 39.0–52.0)
HCT: 42.4 % (ref 39.0–52.0)
Hemoglobin: 14.9 g/dL (ref 13.0–17.0)
Hemoglobin: 14.1 g/dL (ref 13.0–17.0)
MCH: 31.6 pg (ref 26.0–34.0)
MCH: 31.4 pg (ref 26.0–34.0)
MCHC: 33.6 g/dL (ref 30.0–36.0)
MCHC: 33.3 g/dL (ref 30.0–36.0)
MCV: 94.3 fL (ref 80.0–100.0)
MCV: 94.4 fL (ref 80.0–100.0)
Platelets: 201 10*3/uL (ref 150–400)
Platelets: 172 10*3/uL (ref 150–400)
RBC: 4.71 MIL/uL (ref 4.22–5.81)
RBC: 4.49 MIL/uL (ref 4.22–5.81)
RDW: 12.8 % (ref 11.5–15.5)
RDW: 12.9 % (ref 11.5–15.5)
WBC: 8.5 10*3/uL (ref 4.0–10.5)
WBC: 7.3 10*3/uL (ref 4.0–10.5)
nRBC: 0 % (ref 0.0–0.2)
nRBC: 0 % (ref 0.0–0.2)

## 2023-06-29 LAB — BASIC METABOLIC PANEL
Anion gap: 11 (ref 5–15)
BUN: 16 mg/dL (ref 8–23)
CO2: 18 mmol/L — ABNORMAL LOW (ref 22–32)
Calcium: 9.3 mg/dL (ref 8.9–10.3)
Chloride: 108 mmol/L (ref 98–111)
Creatinine, Ser: 0.95 mg/dL (ref 0.61–1.24)
GFR, Estimated: >60 mL/min (ref >60)
Glucose, Bld: 122 mg/dL — ABNORMAL HIGH (ref 70–99)
Potassium: 4 mmol/L (ref 3.5–5.1)
Sodium: 137 mmol/L (ref 135–145)

## 2023-06-29 LAB — RESP PANEL BY RT-PCR (RSV, FLU A&B, COVID)  RVPGX2
Influenza A by PCR: NEGATIVE
Influenza B by PCR: NEGATIVE
Resp Syncytial Virus by PCR: NEGATIVE
SARS Coronavirus 2 by RT PCR: NEGATIVE

## 2023-06-29 LAB — URINALYSIS, W/ REFLEX TO CULTURE (INFECTION SUSPECTED)
Bilirubin Urine: NEGATIVE
Glucose, UA: NEGATIVE mg/dL
Hgb urine dipstick: NEGATIVE
Ketones, ur: NEGATIVE mg/dL
Leukocytes,Ua: NEGATIVE
Nitrite: POSITIVE — AB
Protein, ur: NEGATIVE mg/dL
Specific Gravity, Urine: 1.015 (ref 1.005–1.030)
pH: 5 (ref 5.0–8.0)

## 2023-06-29 LAB — I-STAT CG4 LACTIC ACID, ED: Lactic Acid, Venous: 1.6 mmol/L (ref 0.5–1.9)

## 2023-06-29 LAB — TROPONIN I (HIGH SENSITIVITY): Troponin I (High Sensitivity): 11 ng/L (ref <18)

## 2023-06-29 LAB — CREATININE, SERUM
Creatinine, Ser: 1.05 mg/dL (ref 0.61–1.24)
GFR, Estimated: >60 mL/min (ref >60)

## 2023-06-29 LAB — BRAIN NATRIURETIC PEPTIDE: B Natriuretic Peptide: 225.9 pg/mL — ABNORMAL HIGH (ref 0.0–100.0)

## 2023-06-29 LAB — MAGNESIUM: Magnesium: 2 mg/dL (ref 1.7–2.4)

## 2023-06-29 MED ORDER — ACETAMINOPHEN 325 MG PO TABS
650.0000 mg | ORAL_TABLET | Freq: Four times a day (QID) | ORAL | Status: DC | PRN
  Filled 2023-06-29: qty 2

## 2023-06-29 MED ORDER — CEFTRIAXONE SODIUM 1 G IJ SOLR
1.0000 g | INTRAMUSCULAR | Status: DC
  Administered 2023-06-30: 1 g via INTRAVENOUS
  Filled 2023-06-29: qty 10

## 2023-06-29 MED ORDER — IPRATROPIUM-ALBUTEROL 0.5-2.5 (3) MG/3ML IN SOLN: 3.0000 mL | Freq: Once | RESPIRATORY_TRACT | Status: DC

## 2023-06-29 MED ORDER — LEVALBUTEROL HCL 1.25 MG/0.5ML IN NEBU
1.2500 mg | INHALATION_SOLUTION | Freq: Four times a day (QID) | RESPIRATORY_TRACT | Status: DC
  Administered 2023-06-29: 1.25 mg via RESPIRATORY_TRACT
  Filled 2023-06-29 (×3): qty 0.5

## 2023-06-29 MED ORDER — FUROSEMIDE 10 MG/ML IJ SOLN
8.0000 mg/h | INTRAVENOUS | Status: DC
  Administered 2023-06-29 – 2023-06-30 (×2): 8 mg/h via INTRAVENOUS
  Filled 2023-06-29 (×3): qty 20

## 2023-06-29 MED ORDER — FUROSEMIDE 10 MG/ML IJ SOLN
60.0000 mg | Freq: Once | INTRAMUSCULAR | Status: AC
  Administered 2023-06-29: 60 mg via INTRAVENOUS
  Filled 2023-06-29: qty 6

## 2023-06-29 MED ORDER — ASPIRIN 81 MG PO TBEC
81.0000 mg | DELAYED_RELEASE_TABLET | Freq: Once | ORAL | Status: AC
  Administered 2023-06-29: 81 mg via ORAL
  Filled 2023-06-29: qty 1

## 2023-06-29 MED ORDER — ACETAMINOPHEN 650 MG RE SUPP: 650.0000 mg | Freq: Four times a day (QID) | RECTAL | Status: DC | PRN

## 2023-06-29 MED ORDER — OXYBUTYNIN CHLORIDE 5 MG PO TABS: 2.5000 mg | ORAL_TABLET | Freq: Every day | ORAL | Status: DC

## 2023-06-29 MED ORDER — SODIUM CHLORIDE 0.9 % IV SOLN
1.0000 g | Freq: Once | INTRAVENOUS | Status: AC
  Administered 2023-06-29: 1 g via INTRAVENOUS
  Filled 2023-06-29: qty 10

## 2023-06-29 MED ORDER — LEVALBUTEROL HCL 1.25 MG/0.5ML IN NEBU
INHALATION_SOLUTION | RESPIRATORY_TRACT | Status: AC
  Administered 2023-06-29: 1.25 mg
  Filled 2023-06-29: qty 0.5

## 2023-06-29 MED ORDER — METOPROLOL TARTRATE 25 MG PO TABS
25.0000 mg | ORAL_TABLET | Freq: Two times a day (BID) | ORAL | Status: DC
  Administered 2023-06-29 – 2023-07-01 (×4): 25 mg via ORAL
  Filled 2023-06-29 (×4): qty 1

## 2023-06-29 MED ORDER — TAMSULOSIN HCL 0.4 MG PO CAPS: 0.4000 mg | ORAL_CAPSULE | Freq: Every day | ORAL | Status: DC

## 2023-06-29 MED ORDER — DILTIAZEM HCL 30 MG PO TABS
30.0000 mg | ORAL_TABLET | Freq: Three times a day (TID) | ORAL | Status: DC
  Administered 2023-06-30 – 2023-07-02 (×7): 30 mg via ORAL
  Filled 2023-06-29 (×7): qty 1

## 2023-06-29 MED ORDER — DILTIAZEM HCL 30 MG PO TABS
30.0000 mg | ORAL_TABLET | Freq: Once | ORAL | Status: AC
  Administered 2023-06-29: 30 mg via ORAL
  Filled 2023-06-29: qty 1

## 2023-06-29 MED ORDER — ACETAMINOPHEN 325 MG PO TABS: 650.0000 mg | ORAL_TABLET | Freq: Four times a day (QID) | ORAL | Status: DC | PRN

## 2023-06-29 MED ORDER — ENOXAPARIN SODIUM 40 MG/0.4ML IJ SOSY
40.0000 mg | PREFILLED_SYRINGE | INTRAMUSCULAR | Status: DC
  Administered 2023-06-30 – 2023-07-02 (×3): 40 mg via SUBCUTANEOUS
  Filled 2023-06-29 (×3): qty 0.4

## 2023-06-29 MED ORDER — POLYETHYLENE GLYCOL 3350 17 G PO PACK
17.0000 g | PACK | Freq: Every day | ORAL | Status: DC | PRN
  Administered 2023-07-01 – 2023-07-02 (×2): 17 g via ORAL
  Filled 2023-06-29 (×3): qty 1

## 2023-06-29 MED ORDER — IPRATROPIUM BROMIDE 0.02 % IN SOLN
0.5000 mg | Freq: Four times a day (QID) | RESPIRATORY_TRACT | Status: DC
  Administered 2023-06-29: 0.5 mg via RESPIRATORY_TRACT
  Filled 2023-06-29: qty 2.5

## 2023-06-29 MED ORDER — SODIUM CHLORIDE 0.9% FLUSH
3.0000 mL | Freq: Two times a day (BID) | INTRAVENOUS | Status: DC
  Administered 2023-06-29 – 2023-07-02 (×7): 3 mL via INTRAVENOUS

## 2023-06-29 MED ORDER — SODIUM CHLORIDE 0.9 % IV SOLN
500.0000 mg | Freq: Once | INTRAVENOUS | Status: AC
  Administered 2023-06-29: 500 mg via INTRAVENOUS
  Filled 2023-06-29: qty 5

## 2023-06-29 MED ORDER — IOHEXOL 350 MG/ML SOLN
75.0000 mL | Freq: Once | INTRAVENOUS | Status: AC | PRN
  Administered 2023-06-29: 75 mL via INTRAVENOUS

## 2023-06-29 MED ORDER — METOPROLOL TARTRATE 25 MG PO TABS
25.0000 mg | ORAL_TABLET | Freq: Once | ORAL | Status: AC
  Administered 2023-06-29: 25 mg via ORAL
  Filled 2023-06-29: qty 1

## 2023-06-29 MED ORDER — ACETAMINOPHEN 325 MG PO TABS
650.0000 mg | ORAL_TABLET | Freq: Once | ORAL | Status: AC
  Administered 2023-06-29: 650 mg via ORAL
  Filled 2023-06-29: qty 2

## 2023-06-29 NOTE — Assessment & Plan Note (Addendum)
 Metabolic acidosis I suspect reflects compensation for respiratory alkalosis.  Given patient's hypoxemia/tachypnea last several days.  Patient will get treated with Lasix /oxygen and I anticipate this will resolve in the next 24 hours.

## 2023-06-29 NOTE — ED Triage Notes (Signed)
 Pt BIB [facility] from home C/O SOB, weakness X 2 weeks. Hx Afib, HR 120s en route. SaO2 88% RA, up to 94% 4L [place].
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
## 2023-06-29 NOTE — Assessment & Plan Note (Addendum)
 Patient reports waking up to 3 times and nigth to urinate a fair amount. Ongoing similar symptoms during the day.  Patient actually stopped taking his Lasix  due to this side effect .  This would not be consistent with lasix  use (which he doses first thing in the AM) due to much shorter duration of action of lasix ..  Discussed with patient.  Patient does have leukocytes in the urine.  This sample has reflexed to a culture.  This may potentially explain the patient's polyuria as a UTI.  Patient is s/p ceftriaxone  in the ER.  I think we can continue same once a day till cultures result.  Patient also has history of urologic malignancy. rostate cancer status post radical prostatectomy in 2000, and bladder cancer status post TURBT in 2017.  So BPH should be less likely. Will blader scan bid while on di-uresis. Once diuresis is complete, may consider overactive bladder rx if symptoms persist.  Patient is already seeing urology as outpatient. C.w. same.

## 2023-06-29 NOTE — ED Notes (Signed)
 RT at bedside assessing pt

## 2023-06-29 NOTE — Telephone Encounter (Signed)
 Outpatient service line  Received a page about having issues with heart rate and non specificed some pain.  Attempted to call patient went straight to voicemail.  Left a voice message to page us  back.

## 2023-06-29 NOTE — H&P (Addendum)
 History and Physical    Patient: Eric [name] FMW:[id number] DOB: Jun 05, 1929 DOA: 06/29/2023 DOS: the patient was seen and examined on 06/29/2023 PCP: Rolinda Millman, MD  Patient coming from: Home  Chief Complaint:  Chief Complaint  Patient presents with   Shortness of Breath   HPI: Eric [name] is a 89 y.o. male with medical history significant of bioprosthetic aortic valve replacement along with SVG to OM1 in 2009.  Presented with atypical atrial flutter SP cardioversion in 2015 and again in 2019 when he presented with heart failure and A-fib in [place]  along with severe AI.  Underwent successful TAVR with placement of a 23 mm Edwards SAPIEN 3 THV on 11/25/2018.  Ascending aortic aneurysm, CT scan 10/15/2022 revealing 4.5 cm fusiform dilatation.  He has been in persistent atrial fibrillation .  Wife also describes that the patient has previously had prostate cancer requiring prostatectomy.  And patient has had undetectable PSA since then.  Patient is supposed to be on Lasix  at home.  However patient reports having frequent micturition especially at night.  Therefore patient has not been taking his Lasix  for several weeks.  For the last couple of weeks he has noticed sensation of shortness of breath while going upstairs.  Such that he would have to pause in the middle of the steps to rest.  Since then his sensation of shortness of breath has progressed and today basically patient was short of breath even at rest and he finally said to his wife that I think I need to go to the ER .  There is no report of patient having any chest pain vomiting diarrhea fever coughing expectoration.  Patient does report having ankle swelling for the last couple of weeks that has gotten worse such that his shoes do not fit him anymore.  Wife states that the patient weighs himself daily but the scale has been giving readings that fluctuate markedly within a day.   Patient does not have history of similar episodes  except for one episode several years ago where he had gotten leg swelling and had to be diuresed at a hospital in [place] .  EMS was finally called to see the patient today and patient was found to be hypoxic in the 80s on room air.  Also found to have RVR of his A-fib.  Patient did receive diltiazem  in the ER.  4 L/min of supplementary oxygen.  Chest x-ray initially was concerning for bilateral consolidation.  Patient is s/p ceftriaxone  and azithromycin  in the ER.  Since then he has had a CAT scan of the chest done which is showing bilateral pleural effusions and atelectasis.  Medical evaluation is sought. Review of Systems: As mentioned in the history of present illness. All other systems reviewed and are negative. Past Medical History:  Diagnosis Date   Atrial flutter, paroxysmal (HCC)    a. dx 04-02-2014, s/p  successful cardioversion 04-06-2014.   Chronic anticoagulation    on Eliquis --  due to recurrent dvt's and pe's   Dyslipidemia    History of bladder cancer urologist-  dr chauncey   02-13-2016  s/p TURBT per path high grade papillary urothelial carcinoma   History of DVT (deep vein thrombosis)    2000-- RLE   History of melanoma excision    left flank;  07/ 2014 left lower leg and right upper chest   History of prostate cancer    Gleason 6--  s/p  radical prostatectomy 06/ 2000 in [place]---  no recurrence  History of pulmonary embolus (PE)    2000 & 2005  bilateral   Hx of valvuloplasty 06/19/2007   a. 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 05/2007 CABG x 1: s/p SVG-OM;  b. 10/2010 Ex MV: EF 72%, inf attenuation w/o ischemia, brief run of PAT with exercise. To   Stroke (HCC)    Thrombocytopenia (HCC)    Thyroid  goiter 2013    nodular   Wears hearing aid    BILATERAL   Wears partial dentures    UPPER   Past Surgical History:  Procedure Laterality Date   AORTIC VALVE REPLACEMENT (AVR)/CORONARY ARTERY BYPASS GRAFTING (CABG)  06-19-2007  dr lucas   SVG to OM1 ;   AVR w/ #23 Mitralflow pericardial and ligation left atrial appendage;  MV repair w/ #28 Sorin 3-D memo Ring Annuloplasty with repair ruptured chordar of P-2 flail segments   CARDIAC CATHETERIZATION  05-12-2007  dr malva   single vessel 30% ostial LAD,  80% LCx;  severe to critial AS,  moderate MR,  normal LVF, ef 70%,  normal right heart pressures and cardiac outputs,  mild elevated LV end-diastolic pressure     CARDIOVASCULAR STRESS TEST  11-02-2010  dr dann   normal nuclear study w/ no ischemia or infarct/scar/  normal LV function and wall motion , ef 72%   CARDIOVERSION N/A 04/06/2014   Procedure: CARDIOVERSION;  Surgeon: Leim VEAR Moose, MD;  Location: [facility] ENDOSCOPY;  Service: Cardiovascular;  Laterality: N/A;   successful   CATARACT EXTRACTION W/ INTRAOCULAR LENS  IMPLANT, BILATERAL  2012  approx   CORONARY ARTERY BYPASS GRAFT     PROSTATECTOMY     RETROPUBIC RADICAL PROSTATECTOMY  06/ 2000  in [place], [place]   RIGHT/LEFT HEART CATH AND CORONARY/GRAFT ANGIOGRAPHY N/A 10/29/2018   Procedure: RIGHT/LEFT HEART CATH AND CORONARY/GRAFT ANGIOGRAPHY;  Surgeon: Wonda Sharper, MD;  Location: [facility] INVASIVE CV LAB;  Service: Cardiovascular;  Laterality: N/A;   ROTATOR CUFF REPAIR Left 09/1998   TEE WITHOUT CARDIOVERSION N/A 04/06/2014   Procedure: TRANSESOPHAGEAL ECHOCARDIOGRAM (TEE);  Surgeon: Leim VEAR Moose, MD;  Location: [facility] ENDOSCOPY;  Service: Cardiovascular;  Laterality: N/A;  mild focal basa LVH of the septum, ef 50-55%, s/p AV annuloplasty ring, elongated chordae, thickened leaflets, mild to mod. MR, multiple small jets/arotic bioprosthetic valve sits well in position, mild AI/ thickened TV w/ mild reurg./mild LA   TEE WITHOUT CARDIOVERSION N/A  10/22/2018   Procedure: TRANSESOPHAGEAL ECHOCARDIOGRAM (TEE);  Surgeon: Jeffrie Oneil BROCKS, MD;  Location: [facility] ENDOSCOPY;  Service: Cardiovascular;  Laterality: N/A;   TEE WITHOUT CARDIOVERSION N/A 11/25/2018   Procedure: TRANSESOPHAGEAL ECHOCARDIOGRAM (TEE);  Surgeon: Wonda Sharper, MD;  Location: [facility] OR;  Service: Open Heart Surgery;  Laterality: N/A;   TRANSCATHETER AORTIC VALVE REPLACEMENT, TRANSFEMORAL N/A 11/25/2018   Procedure: TRANSCATHETER AORTIC VALVE REPLACEMENT, TRANSFEMORAL;  Surgeon: Wonda Sharper, MD;  Location[facility] OR;  Service: Open Heart Surgery;  Laterality: N/A;   TRANSTHORACIC ECHOCARDIOGRAM  03-23-2016   dr dann   EF 55-60%, reduced contribution atrial contraction to ventricular filling, due to increased ventricular diastolic pressure or atrial contratile dysfunction/  bioprosthetic AV w/ mod. regurg. (valve area 1.97cm^2)/ mild dilated ascending aorta/ mild thicken MV normal function annular ring prosthesis/severe LAE &  mod. to sev.RAE/ PASP 97mmHg/ mild TR/ ventricle septal motion show paradox   TRANSURETHRAL RESECTION OF BLADDER TUMOR N/A 02/13/2016   Procedure: TRANSURETHRAL RESECTION OF BLADDER TUMOR (TURBT);  Surgeon: Redell Lynwood Napoleon, MD;  Location: [facility];  Service: Urology;  Laterality: N/A;   TRANSURETHRAL RESECTION OF BLADDER TUMOR N/A 09/04/2016   Procedure: TRANSURETHRAL RESECTION OF BLADDER TUMOR (TURBT);  Surgeon: Redell Lynwood Napoleon, MD;  Location: [facility];  Service: Urology;  Laterality: N/A;   Social History:  reports that he has never smoked. He has never used smokeless tobacco. He reports current alcohol use of about 1.0 - 2.0 standard drink of alcohol per week. He reports that he does not use drugs.  No Known Allergies  Family History  Problem Relation Age of Onset   Leukemia Father    Supraventricular tachycardia Sister    Heart attack Neg Hx    Stroke Neg Hx    Hypertension Neg Hx     Prior to Admission  medications   Medication Sig Start Date End Date Taking? Authorizing Provider  amoxicillin  (AMOXIL ) 500 MG tablet Take 4 tablets (2,000 mg total) by mouth as directed. Take 1 hour prior to dental work, including cleanings. 04/27/19   Dann Candyce RAMAN, MD  aspirin  EC 81 MG tablet Take 81 mg by mouth daily. Swallow whole.    [provider]  atorvastatin  (LIPITOR) 20 MG tablet Take 40 mg by mouth every evening.    [provider]  diltiazem  (CARDIZEM ) 30 MG tablet Take 1 tablet (30 mg total) by mouth 3 (three) times daily. 06/24/23   Ladona Heinz, MD  ELIQUIS  5 MG TABS tablet TAKE 1 TABLET BY MOUTH TWICE  DAILY 12/03/22   Varanasi, Jayadeep S, MD  Eyelid Cleansers (OCUSOFT EYELID CLEANSING) PADS Place 1 application into the left eye in the morning.    [provider]  ezetimibe  (ZETIA ) 10 MG tablet TAKE 1 TABLET BY MOUTH IN THE  EVENING 02/27/23   Dann Candyce RAMAN, MD  furosemide  (LASIX ) 20 MG tablet TAKE 1 TABLET BY MOUTH EVERY  OTHER DAY MAY TAKE 1 ADDITIONAL  DOSE IF NEEDED FOR LEG SWELLING 12/03/22   Dann Candyce RAMAN, MD  metoprolol  tartrate (LOPRESSOR ) 25 MG tablet Take 1 tablet (25 mg total) by mouth 2 (two) times daily. 06/24/23 09/22/23  Ladona Heinz, MD  nitroGLYCERIN  (NITROSTAT ) 0.4 MG SL tablet Place 1 tablet (0.4 mg total) under the tongue every 5 (five) minutes as needed. 02/04/23 06/24/23  Dann Candyce RAMAN, MD    Physical Exam: Vitals:   06/29/23 1429 06/29/23 1430 06/29/23 1436 06/29/23 1500  BP: 137/84 137/84  (!) 134/101  Pulse:  71 (!) 38 69  Resp:  (!) 21 20 (!) 24  Temp:      TempSrc:      SpO2:  96% 95% 95%  Weight:      Height:       General: Patient is alert and awake, appears to be in no distress on 4 L/min of supplementary oxygen, sitting on stretcher with feet on the ground.  Wife and son at the bedside. Respiratory exam: Diminished breath sounds bilateral basilar posteriorly.  With reasonable breath sounds in intrascapular area and  superiorly. Cardiovascular exam irregular systolic murmur heard at the apex Jugular venous distention till just above the clavicle while patient is sitting upright. Abdomen soft nontender Extremities 1+ edema up to the knee.  2+ edema at ankle Distal function intact.  Patient is  nonfocal. Data Reviewed:  Labs on Admission:  Results for orders placed or performed during the hospital encounter of 06/29/23 (from the past 24 hours)  CBC     Status: None   Collection Time: 06/29/23  8:48 AM  Result Value Ref Range   WBC 8.5 4.0 - 10.5 K/uL   RBC 4.71 4.22 - 5.81 MIL/uL   Hemoglobin 14.9 13.0 - 17.0 g/dL   HCT 55.5 60.9 - 47.9 %   MCV 94.3 80.0 - 100.0 fL   MCH 31.6 26.0 - 34.0 pg   MCHC 33.6 30.0 - 36.0 g/dL   RDW 87.1 88.4 - 84.4 %   Platelets 201 150 - 400 K/uL   nRBC 0.0 0.0 - 0.2 %  Basic metabolic panel     Status: Abnormal   Collection Time: 06/29/23  8:48 AM  Result Value Ref Range   Sodium 137 135 - 145 mmol/L   Potassium 4.0 3.5 - 5.1 mmol/L   Chloride 108 98 - 111 mmol/L   CO2 18 (L) 22 - 32 mmol/L   Glucose, Bld 122 (H) 70 - 99 mg/dL   BUN 16 8 - 23 mg/dL   Creatinine, Ser 9.04 0.61 - 1.24 mg/dL   Calcium  9.3 8.9 - 10.3 mg/dL   GFR, Estimated >39 >39 mL/min   Anion gap 11 5 - 15  Brain natriuretic peptide     Status: Abnormal   Collection Time: 06/29/23  8:48 AM  Result Value Ref Range   B Natriuretic Peptide 225.9 (H) 0.0 - 100.0 pg/mL  Troponin I (High Sensitivity)     Status: None   Collection Time: 06/29/23  8:48 AM  Result Value Ref Range   Troponin I (High Sensitivity) 11 <18 ng/L  Magnesium      Status: None   Collection Time: 06/29/23  8:48 AM  Result Value Ref Range   Magnesium  2.0 1.7 - 2.4 mg/dL  Resp panel by RT-PCR (RSV, Flu A&B, Covid) Anterior Nasal Swab     Status: None   Collection Time: 06/29/23  8:49 AM   Specimen: Anterior Nasal Swab  Result Value Ref Range   SARS Coronavirus 2 by RT PCR NEGATIVE NEGATIVE   Influenza A by PCR NEGATIVE  NEGATIVE   Influenza B by PCR NEGATIVE NEGATIVE   Resp Syncytial Virus by PCR NEGATIVE NEGATIVE  Urinalysis, w/ Reflex to Culture (Infection Suspected) -Urine, Clean Catch     Status: Abnormal   Collection Time: 06/29/23  9:07 AM  Result Value Ref Range   Specimen Source URINE, CLEAN CATCH    Color, Urine YELLOW YELLOW   APPearance HAZY (A) CLEAR   Specific Gravity, Urine 1.015 1.005 - 1.030   pH 5.0 5.0 - 8.0   Glucose, UA NEGATIVE NEGATIVE mg/dL   Hgb urine dipstick NEGATIVE NEGATIVE   Bilirubin Urine NEGATIVE NEGATIVE   Ketones, ur NEGATIVE NEGATIVE mg/dL   Protein, ur NEGATIVE NEGATIVE mg/dL   Nitrite POSITIVE (A) NEGATIVE   Leukocytes,Ua NEGATIVE NEGATIVE   RBC / HPF 0-5 0 - 5 RBC/hpf   WBC, UA 6-10 0 - 5 WBC/hpf   Bacteria, UA MANY (A) NONE SEEN   Squamous Epithelial / HPF 0-5 0 - 5 /HPF   Mucus PRESENT   I-Stat CG4 Lactic Acid     Status: None   Collection Time: 06/29/23  9:14 AM  Result Value Ref Range   Lactic Acid, Venous 1.6 0.5 - 1.9 mmol/L   Basic Metabolic Panel: Recent Labs  Lab 06/24/23 1710  06/29/23 0848  NA 139 137  K 4.4 4.0  CL 105 108  CO2 21 18*  GLUCOSE 96 122*  BUN 21 16  CREATININE 1.04 0.95  CALCIUM  9.1 9.3  MG  --  2.0   Liver Function Tests: Recent Labs  Lab 06/24/23 1710  AST 32  ALT 25  ALKPHOS 55  BILITOT 4.2*  PROT 6.5  ALBUMIN 3.9   No results for input(s): LIPASE, AMYLASE in the last 168 hours. No results for input(s): AMMONIA in the last 168 hours. CBC: Recent Labs  Lab 06/24/23 1710 06/29/23 0848  WBC 7.0 8.5  HGB 14.0 14.9  HCT 41.8 44.4  MCV 95 94.3  PLT 180 201   Cardiac Enzymes: Recent Labs  Lab 06/29/23 0848  TROPONINIHS 11    BNP (last 3 results) No results for input(s): PROBNP in the last 8760 hours. CBG: No results for input(s): GLUCAP in the last 168 hours.  Radiological Exams on Admission:  CT Angio Chest PE W and/or Wo Contrast Result Date: 06/29/2023 CLINICAL DATA:  Shortness of  breath. Hypoxia. High probability for pulmonary embolism. EXAM: CT ANGIOGRAPHY CHEST WITH CONTRAST TECHNIQUE: Multidetector CT imaging of the chest was performed using the standard protocol during bolus administration of intravenous contrast. Multiplanar CT image reconstructions and MIPs were obtained to evaluate the vascular anatomy. RADIATION DOSE REDUCTION: This exam was performed according to the departmental dose-optimization program which includes automated exposure control, adjustment of the mA and/or kV according to patient size and/or use of iterative reconstruction technique. CONTRAST:  75mL OMNIPAQUE  IOHEXOL  350 MG/ML SOLN COMPARISON:  10/15/2022 FINDINGS: Cardiovascular: Satisfactory opacification of pulmonary arteries noted, and no pulmonary emboli identified. Mediastinum/Nodes: No pathologically enlarged lymph nodes identified. 2.6 cm low-attenuation right thyroid  lobe nodule shows no significant change. Lungs/Pleura: New moderate to large pleural effusions are seen bilaterally. Compressive atelectasis seen mainly in the lower lobes. Diffuse heterogeneous ground-glass opacity throughout both lungs, likely due to pulmonary edema. Upper abdomen: No acute findings. Musculoskeletal: No suspicious bone lesions identified. Review of the MIP images confirms the above findings. IMPRESSION: No evidence of pulmonary embolism. New moderate to large bilateral pleural effusions and compressive atelectasis. Diffuse ground-glass pulmonary opacity, likely due to pulmonary edema. Stable 2.6 cm right thyroid  lobe nodule. Recommend thyroid  US  if this has not been previously evaluated. (Ref: J Am Coll Radiol. 2015 Feb;12(2): 143-50). Electronically Signed   By: Norleen DELENA Kil M.D.   On: 06/29/2023 13:43   DG Chest Portable 1 View Result Date: 06/29/2023 CLINICAL DATA:  89 year old male with shortness of breath and fever. Hypoxia. EXAM: PORTABLE CHEST 1 VIEW COMPARISON:  CTA chest 10/15/2022 and earlier. FINDINGS: Portable  AP semi upright view at 0857 hours. Chronic sternotomy, TAVR. Lower lung volumes. Stable cardiac size and mediastinal contours. Visualized tracheal air column is within normal limits. Bilateral basilar predominant increased pulmonary interstitium, indistinct vasculature. Evidence of new small right pleural effusion, chronic blunting of the left costophrenic angle. No pneumothorax or consolidation. No acute osseous abnormality identified. Paucity of bowel gas. IMPRESSION: Lower lung volumes with evidence of new small right pleural effusion and increased interstitial opacity which more resembles acute pulmonary edema, but viral/atypical respiratory infection not excluded in the setting of fever. Electronically Signed   By: VEAR Hurst M.D.   On: 06/29/2023 09:27      EKG: Independently reviewed. Afib PVC  No intake/output data recorded. Total I/O In: 350 [IV Piggyback:350] Out: -      Assessment and Plan: * Fluid overload  See HPI.  Patient requiring 4 L/min of supplementary oxygen at this time.  Likely precipitated by nonadherence to Lasix  use.  Will treat with Lasix  60 mg IV now followed by 10 mg/h infusion.  I will order basic metabolic panel checked tomorrow morning and evening.  Check magnesium  level tomorrow.  Last echo on file is from November 25, 2020 showing ejection fraction of 55 to 60%.  I am afraid that the patient may have had a clinical status change into his EF.  I will repeat echo. Urine urine protein/Cr ratio.  Check LFTs in a.m. including direct bilirubin INR and PTT.  Patient initally received ceftriaxone  and azithromycin  in ER for PNA. In retrospect, given no fever, white count, immunesupression and evidence of fluid overload, will stop further therapy for pneumonia (felt to be unlikely). RT PCR -VE    Polyuria Patient reports waking up to 3 times and nigth to urinate a fair amount. Ongoing similar symptoms during the day.  Patient actually stopped taking his Lasix  due to this  side effect .  This would not be consistent with lasix  use (which he doses first thing in the AM) due to much shorter duration of action of lasix ..  Discussed with patient.  Patient does have leukocytes in the urine.  This sample has reflexed to a culture.  This may potentially explain the patient's polyuria as a UTI.  Patient is s/p ceftriaxone  in the ER.  I think we can continue same once a day till cultures result.  Patient also has history of urologic malignancy. rostate cancer status post radical prostatectomy in 2000, and bladder cancer status post TURBT in 2017.  So BPH should be less likely. Will blader scan bid while on di-uresis. Once diuresis is complete, may consider overactive bladder rx if symptoms persist.  Patient is already seeing urology as outpatient. C.w. same.  Acidosis Metabolic acidosis I suspect reflects compensation for respiratory alkalosis.  Given patient's hypoxemia/tachypnea last several days.  Patient will get treated with Lasix /oxygen and I anticipate this will resolve in the next 24 hours.  Atrial fibrillation (HCC) This is chronic, patient had tachycardia on presentation however now rate controlled after diltiazem  in the ER.  At this time given the large pleural effusion on the right side, I will hold the anticoagulation in case thoracentesis becomes necessary.  Continue with metoprolol  25 twice daily and Cardizem  30 mg 3 times a day as started by cardiology on 06/24/2023  Careplan discussed with wife, patient and son at bedside.  Please review med rec after pharmacy input  Advance Care Planning:   Code Status: Do not attempt resuscitation (DNR) PRE-ARREST INTERVENTIONS DESIRED per patient and wife at bedside. Wife will bring the DNR form in AM to hospital  Consults: none at this time.  Family Communication: as abvoe.  Severity of Illness: The appropriate patient status for this patient is INPATIENT. Inpatient status is judged to be reasonable and necessary  in order to provide the required intensity of service to ensure the patient's safety. The patient's presenting symptoms, physical exam findings, and initial radiographic and laboratory data in the context of their chronic comorbidities is felt to place them at high risk for further clinical deterioration. Furthermore, it is not anticipated that the patient will be medically stable for discharge from the hospital within 2 midnights of admission.   * I certify that at the point of admission it is my clinical judgment that the patient will require inpatient hospital care spanning beyond 2 midnights  from the point of admission due to high intensity of service, high risk for further deterioration and high frequency of surveillance required.*  Author: Jacqulyn Divine, MD 06/29/2023 3:38 PM  For on call review [email or web address].

## 2023-06-29 NOTE — Plan of Care (Signed)

## 2023-06-29 NOTE — Assessment & Plan Note (Addendum)
 This is chronic, patient had tachycardia on presentation however now rate controlled after diltiazem  in the ER.  At this time given the large pleural effusion on the right side, I will hold the anticoagulation in case thoracentesis becomes necessary.  Continue with metoprolol  25 twice daily and Cardizem  30 mg 3 times a day as started by cardiology on 06/24/2023

## 2023-06-29 NOTE — Assessment & Plan Note (Addendum)
 See HPI.  Patient requiring 4 L/min of supplementary oxygen at this time.  Likely precipitated by nonadherence to Lasix  use.  Will treat with Lasix  60 mg IV now followed by 10 mg/h infusion.  I will order basic metabolic panel checked tomorrow morning and evening.  Check magnesium  level tomorrow.  Last echo on file is from November 25, 2020 showing ejection fraction of 55 to 60%.  I am afraid that the patient may have had a clinical status change into his EF.  I will repeat echo. Urine urine protein/Cr ratio.  Check LFTs in a.m. including direct bilirubin INR and PTT.  Patient initally received ceftriaxone  and azithromycin  in ER for PNA. In retrospect, given no fever, white count, immunesupression and evidence of fluid overload, will stop further therapy for pneumonia (felt to be unlikely). RT PCR -VE

## 2023-06-29 NOTE — ED Notes (Signed)
Lab called to add on urine culture to urine previously sent.

## 2023-06-30 DIAGNOSIS — I5033 Acute on chronic diastolic (congestive) heart failure: Secondary | ICD-10-CM | POA: Diagnosis not present

## 2023-06-30 DIAGNOSIS — E8779 Other fluid overload: Secondary | ICD-10-CM

## 2023-06-30 DIAGNOSIS — I4891 Unspecified atrial fibrillation: Secondary | ICD-10-CM | POA: Diagnosis not present

## 2023-06-30 LAB — PROTIME-INR
INR: 1.3 — ABNORMAL HIGH (ref 0.8–1.2)
Prothrombin Time: 16.8 s — ABNORMAL HIGH (ref 11.4–15.2)

## 2023-06-30 LAB — COMPREHENSIVE METABOLIC PANEL
ALT: 29 U/L (ref 0–44)
AST: 33 U/L (ref 15–41)
Albumin: 3.5 g/dL (ref 3.5–5.0)
Alkaline Phosphatase: 45 U/L (ref 38–126)
Anion gap: 10 (ref 5–15)
BUN: 18 mg/dL (ref 8–23)
CO2: 23 mmol/L (ref 22–32)
Calcium: 8.6 mg/dL — ABNORMAL LOW (ref 8.9–10.3)
Chloride: 103 mmol/L (ref 98–111)
Creatinine, Ser: 1.16 mg/dL (ref 0.61–1.24)
GFR, Estimated: 59 mL/min — ABNORMAL LOW (ref >60)
Glucose, Bld: 104 mg/dL — ABNORMAL HIGH (ref 70–99)
Potassium: 3.5 mmol/L (ref 3.5–5.1)
Sodium: 136 mmol/L (ref 135–145)
Total Bilirubin: 5.1 mg/dL — ABNORMAL HIGH (ref 0.0–1.2)
Total Protein: 6.4 g/dL — ABNORMAL LOW (ref 6.5–8.1)

## 2023-06-30 LAB — BASIC METABOLIC PANEL
Anion gap: 12 (ref 5–15)
BUN: 23 mg/dL (ref 8–23)
CO2: 25 mmol/L (ref 22–32)
Calcium: 9.1 mg/dL (ref 8.9–10.3)
Chloride: 100 mmol/L (ref 98–111)
Creatinine, Ser: 1.3 mg/dL — ABNORMAL HIGH (ref 0.61–1.24)
GFR, Estimated: 52 mL/min — ABNORMAL LOW (ref >60)
Glucose, Bld: 102 mg/dL — ABNORMAL HIGH (ref 70–99)
Potassium: 3.4 mmol/L — ABNORMAL LOW (ref 3.5–5.1)
Sodium: 137 mmol/L (ref 135–145)

## 2023-06-30 LAB — CBC
HCT: 40.7 % (ref 39.0–52.0)
Hemoglobin: 13.8 g/dL (ref 13.0–17.0)
MCH: 31.6 pg (ref 26.0–34.0)
MCHC: 33.9 g/dL (ref 30.0–36.0)
MCV: 93.1 fL (ref 80.0–100.0)
Platelets: 173 10*3/uL (ref 150–400)
RBC: 4.37 MIL/uL (ref 4.22–5.81)
RDW: 12.9 % (ref 11.5–15.5)
WBC: 6.5 10*3/uL (ref 4.0–10.5)
nRBC: 0 % (ref 0.0–0.2)

## 2023-06-30 LAB — APTT: aPTT: 32 s (ref 24–36)

## 2023-06-30 LAB — BILIRUBIN, DIRECT: Bilirubin, Direct: 0.3 mg/dL — ABNORMAL HIGH (ref 0.0–0.2)

## 2023-06-30 LAB — MAGNESIUM: Magnesium: 1.8 mg/dL (ref 1.7–2.4)

## 2023-06-30 MED ORDER — LEVALBUTEROL HCL 1.25 MG/0.5ML IN NEBU: 1.2500 mg | INHALATION_SOLUTION | Freq: Four times a day (QID) | RESPIRATORY_TRACT | Status: DC | PRN

## 2023-06-30 NOTE — Progress Notes (Signed)
   06/30/23 0051  Respiratory Severity Assessment  $ Protocol Assessment  Yes  Heart Rate 0  Breath Sounds 1  Respiratory Pattern 0  Cough 0  Chest X Ray 2  O2 Requirements/ Pulse Ox Sat(%) 1  Mental Status 0  Dyspnea 1  Score Total 5  Aerosolized Bronchodilators  Aerosolized bronchodilator indications Aerosolized bronchodilator indicated  Medication Plan of Care Hand held neb treatment  Oxygen Therapy  Oxygen therapy indications Oxygen therapy indicated  Oxygen Plan of care Nasal cannula  Respiratory Therapy Follow Up Assessment  Assessment follow up date 07/01/23  Follow-up assessment complete Yes   Pt adm for fluid PL after he stopped taking his lasix  at home due to having to get up to urinate at night. . Fluid OL is being treated with IV lasix . Pt is on 4LPM [place] sat 100%. CXR shows infiltrates likely pulmonary edema. Pt was ordered Xop/ Atrovent  nebs Q6, pt does not have any pulmonary history. Nebs will be changed to PRN only.  Reassess as needed.

## 2023-06-30 NOTE — Consult Note (Signed)
 Cardiology Consultation:   Patient ID: Eric [name]; [id number]; 11/12/1929   Admit date: 06/29/2023 Date of Consult: 06/30/2023  Primary Care Provider: Rolinda Millman, MD Primary Cardiologist: Gordy Bergamo, MD  History of Present Illness:   Mr. Eric [name] is a 89 y.o. male with past medical history outlined below, currently admitted to the hospital reporting fluid retention/legs swelling over the last month, also increasing shortness of breath.  He had a recent visit with Dr. Bergamo on February 3, noted to be in rapid atrial fibrillation at that time (baseline permanent atrial fibrillation) resulting in medication adjustments.  He was started on Lopressor  and short acting Cardizem  at that point with plan for follow-up echocardiogram.  He had been prescribed as needed Lasix  previously, was not using any any regularity over the last few months.  Weight is up over baseline potentially 8 to 10 pounds.  Chest CT obtained yesterday showed moderate to large bilateral pleural effusions, pulmonary edema, no pulmonary embolus.  He has been started on a Lasix  infusion with brisk diuresis so far.  Approximately 1300 cc net diuresis initially.  Heart rate continues to fluctuate in atrial fibrillation, he did respond to a dose of IV diltiazem  initially and has been started back on combination of oral Lopressor  and short acting Cardizem .  He is typically on Eliquis  as an outpatient, presently held with transition to Lovenox  by primary team.  ROS:  Pertinent review in history of present illness.  Chronic hearing loss, uses hearing aids.  Recently undergoing PT as an outpatient.  No exertional chest pain.  Stable appetite.  Past Medical History:  Diagnosis Date   Atrial flutter, paroxysmal (HCC)    a. dx 04-02-2014, s/p  successful cardioversion 04-06-2014.   Chronic anticoagulation    on Eliquis --  due to recurrent dvt's and pe's   Dyslipidemia    History of bladder cancer urologist-  dr Eric [name]   02-13-2016   s/p TURBT per path high grade papillary urothelial carcinoma   History of DVT (deep vein thrombosis)    2000-- RLE   History of melanoma excision    left flank;  07/ 2014 left lower leg and right upper chest   History of prostate cancer    Gleason 6--  s/p  radical prostatectomy 06/ 2000 in [place], [place]---  no recurrence   History of pulmonary embolus (PE)    2000 & 2005  bilateral   Hx of valvuloplasty 06/19/2007   a. 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 05/2007 CABG x 1: s/p SVG-OM;  b. 10/2010 Ex MV: EF 72%, inf attenuation w/o ischemia, brief run of PAT with exercise. To   Stroke (HCC)    Thrombocytopenia (HCC)    Thyroid  goiter 2013   nodular   Wears hearing aid    BILATERAL   Wears partial dentures    UPPER    Past Surgical History:  Procedure Laterality Date   AORTIC VALVE REPLACEMENT (AVR)/CORONARY ARTERY BYPASS GRAFTING (CABG)  06-19-2007  dr lucas   SVG to OM1 ;   AVR w/ #23 Mitralflow pericardial and ligation left atrial appendage;  MV repair w/ #28 Sorin 3-D memo Ring Annuloplasty with repair ruptured chordar of P-2 flail segments   CARDIAC CATHETERIZATION  05-12-2007  dr malva   single vessel 30% ostial LAD,  80% LCx;  severe to critial AS,  moderate MR,  normal LVF, ef 70%,  normal right heart pressures and cardiac outputs,  mild elevated LV end-diastolic pressure     CARDIOVASCULAR STRESS TEST  11-02-2010  dr dann   normal nuclear study w/ no ischemia or infarct/scar/  normal LV function and wall motion , ef 72%   CARDIOVERSION N/A 04/06/2014   Procedure: CARDIOVERSION;  Surgeon: Eric VEAR Moose, MD;  Location: [facility] ENDOSCOPY;  Service: Cardiovascular;  Laterality: N/A;   successful   CATARACT EXTRACTION W/  INTRAOCULAR LENS  IMPLANT, BILATERAL  2012  approx   CORONARY ARTERY BYPASS GRAFT     PROSTATECTOMY     RETROPUBIC RADICAL PROSTATECTOMY  06/ 2000  in [place], [place]   RIGHT/LEFT HEART CATH AND CORONARY/GRAFT ANGIOGRAPHY N/A 10/29/2018   Procedure: RIGHT/LEFT HEART CATH AND CORONARY/GRAFT ANGIOGRAPHY;  Surgeon: Eric Sharper, MD;  Location: [facility] INVASIVE CV LAB;  Service: Cardiovascular;  Laterality: N/A;   ROTATOR CUFF REPAIR Left 09/1998   TEE WITHOUT CARDIOVERSION N/A 04/06/2014   Procedure: TRANSESOPHAGEAL ECHOCARDIOGRAM (TEE);  Surgeon: Eric VEAR Moose, MD;  Location: [facility] ENDOSCOPY;  Service: Cardiovascular;  Laterality: N/A;  mild focal basa LVH of the septum, ef 50-55%, s/p AV annuloplasty ring, elongated chordae, thickened leaflets, mild to mod. MR, multiple small jets/arotic bioprosthetic valve sits well in position, mild AI/ thickened TV w/ mild reurg./mild LA   TEE WITHOUT CARDIOVERSION N/A 10/22/2018   Procedure: TRANSESOPHAGEAL ECHOCARDIOGRAM (TEE);  Surgeon: Eric Oneil BROCKS, MD;  Location: [facility] ENDOSCOPY;  Service: Cardiovascular;  Laterality: N/A;   TEE WITHOUT CARDIOVERSION N/A 11/25/2018   Procedure: TRANSESOPHAGEAL ECHOCARDIOGRAM (TEE);  Surgeon: Eric Sharper, MD;  Location: [facility] OR;  Service: Open Heart Surgery;  Laterality: N/A;   TRANSCATHETER AORTIC VALVE REPLACEMENT, TRANSFEMORAL N/A 11/25/2018   Procedure: TRANSCATHETER AORTIC VALVE REPLACEMENT, TRANSFEMORAL;  Surgeon: Eric Sharper, MD;  Location: [facility] OR;  Service: Open Heart Surgery;  Laterality: N/A;   TRANSTHORACIC ECHOCARDIOGRAM  03-23-2016   dr dann   EF 55-60%, reduced contribution atrial contraction to ventricular filling, due to increased ventricular diastolic pressure or atrial contratile dysfunction/  bioprosthetic AV w/ mod. regurg. (valve area 1.97cm^2)/ mild dilated ascending aorta/ mild thicken MV normal function annular ring prosthesis/severe LAE & mod. to sev.RAE/ PASP 73mmHg/ mild TR/ ventricle septal motion  show paradox   TRANSURETHRAL RESECTION OF BLADDER TUMOR N/A 02/13/2016   Procedure: TRANSURETHRAL RESECTION OF BLADDER TUMOR (TURBT);  Surgeon: Redell Lynwood Napoleon, MD;  Location: [facility];  Service: Urology;  Laterality: N/A;   TRANSURETHRAL RESECTION OF BLADDER TUMOR N/A 09/04/2016   Procedure: TRANSURETHRAL RESECTION OF BLADDER TUMOR (TURBT);  Surgeon: Redell Lynwood Napoleon, MD;  Location: [facility];  Service: Urology;  Laterality: N/A;     Inpatient Medications: Scheduled Meds:  diltiazem   30 mg Oral TID   enoxaparin  (LOVENOX ) injection  40 mg Subcutaneous Q24H   metoprolol  tartrate  25 mg Oral BID   sodium chloride  flush  3 mL Intravenous Q12H   Continuous Infusions:  cefTRIAXone  (ROCEPHIN )  IV     furosemide  (LASIX ) 200 mg in dextrose  5 % 100 mL (2 mg/mL) infusion 8 mg/hr (06/29/23 1816)   PRN Meds: acetaminophen  **OR** acetaminophen , levalbuterol , polyethylene glycol  Allergies:   No Known Allergies  Social History:   Social History   Tobacco Use   Smoking status: Never   Smokeless tobacco: Never  Substance Use Topics  Alcohol use: Yes    Alcohol/week: 1.0 - 2.0 standard drink of alcohol    Types: 1 - 2 Standard drinks or equivalent per week    Comment: OCC    Family History:   The patient's family history includes Leukemia in his father; Supraventricular tachycardia in his sister. There is no history of Heart attack, Stroke, or Hypertension.  Physical Exam/Data:   Vitals:   06/29/23 2239 06/30/23 0423 06/30/23 0500 06/30/23 0855  BP: 117/83 110/88  (!) 156/69  Pulse: 78 92    Resp: 18 18    Temp:  97.8 F (36.6 C)  98.3 F (36.8 C)  TempSrc:  Oral  Oral  SpO2: 98% 97%    Weight:   85.9 kg   Height:        Intake/Output Summary (Last 24 hours) at 06/30/2023 0857 Last data filed at 06/30/2023 0400 Gross per 24 hour  Intake 392.88 ml  Output 1700 ml  Net -1307.12 ml   Filed Weights   06/29/23 0849 06/30/23 0500  Weight:  81.6 kg 85.9 kg   Body mass index is 25.68 kg/m.   Gen: Patient appears comfortable at rest. HEENT: Conjunctiva and lids normal, oropharynx clear. Neck: Supple, no elevated JVP or carotid bruits. Lungs: Crackles at the bases with decreased breath sounds lower to mid lung zones. Cardiac: Irregularly irregular, 2/6 systolic murmur, no gallop. Abdomen: Soft, nontender, bowel sounds present. Extremities: 2+ pitting edema, distal pulses 2+. Skin: Warm and dry. Musculoskeletal: No kyphosis. Neuropsychiatric: Alert and oriented x3, affect grossly appropriate.  EKG:  An ECG dated 06/29/2023 was personally reviewed today and demonstrated:  Atrial fibrillation with RVR, frequent PVCs.  Telemetry:  I personally reviewed telemetry which shows atrial fibrillation.  Relevant CV Studies:  Echocardiogram 11/25/2020:  1. Left ventricular ejection fraction, by estimation, is 55 to 60%. The  left ventricle has normal function. The left ventricle has no regional  wall motion abnormalities. There is mild left ventricular hypertrophy.  Left ventricular diastolic parameters  are indeterminate.   2. Right ventricular systolic function is mildly reduced. The right  ventricular size is moderately enlarged. There is normal pulmonary artery  systolic pressure. The estimated right ventricular systolic pressure is  29.3 mmHg.   3. Left atrial size was mildly dilated.   4. Right atrial size was moderately dilated.   5. The mitral valve has been repaired/replaced. No evidence of mitral  valve regurgitation. There is a prosthetic annuloplasty ring present in  the mitral position. The mean mitral valve gradient is 4.0 mmHg at 56bpm   6. The aortic valve has been repaired/replaced. Aortic valve  regurgitation is trivial. There is a 23 mm Edwards Sapien prosthetic,  stented (TAVR) valve present in the aortic position. MG 7 mmHg. Echo  findings are consistent with normal structure and  function of the aortic valve  prosthesis.   7. Aortic dilatation noted. There is mild dilatation of the ascending  aorta, measuring 39 mm.   8. The inferior vena cava is dilated in size with >50% respiratory  variability, suggesting right atrial pressure of 8 mmHg.   9. Agitated saline contrast bubble study was negative, with no evidence  of any interatrial shunt.   Laboratory Data:  Chemistry Recent Labs  Lab 06/24/23 1710 06/29/23 0848 06/29/23 1734 06/30/23 0626  NA 139 137  --  136  K 4.4 4.0  --  3.5  CL 105 108  --  103  CO2 21 18*  --  23  GLUCOSE 96 122*  --  104*  BUN 21 16  --  18  CREATININE 1.04 0.95 1.05 1.16  CALCIUM  9.1 9.3  --  8.6*  GFRNONAA  --  >60 >60 59*  ANIONGAP  --  11  --  10    Recent Labs  Lab 06/24/23 1710 06/30/23 0626  PROT 6.5 6.4*  ALBUMIN 3.9 3.5  AST 32 33  ALT 25 29  ALKPHOS 55 45  BILITOT 4.2* 5.1*   Hematology Recent Labs  Lab 06/29/23 0848 06/29/23 1734 06/30/23 0626  WBC 8.5 7.3 6.5  RBC 4.71 4.49 4.37  HGB 14.9 14.1 13.8  HCT 44.4 42.4 40.7  MCV 94.3 94.4 93.1  MCH 31.6 31.4 31.6  MCHC 33.6 33.3 33.9  RDW 12.8 12.9 12.9  PLT 201 172 173   Cardiac Enzymes Recent Labs  Lab 06/29/23 0848  TROPONINIHS 11   BNP Recent Labs  Lab 06/29/23 0848  BNP 225.9*     Lipid Panel     Component Value Date/Time   CHOL 127 11/25/2020 0258   CHOL 130 04/27/2019 0840   TRIG 57 11/25/2020 0258   HDL 50 11/25/2020 0258   HDL 63 04/27/2019 0840   CHOLHDL 2.5 11/25/2020 0258   VLDL 11 11/25/2020 0258   LDLCALC 66 11/25/2020 0258   LDLCALC 53 04/27/2019 0840   LABVLDL 14 04/27/2019 0840    Radiology/Studies:  CT Angio Chest PE W and/or Wo Contrast Result Date: 06/29/2023 CLINICAL DATA:  Shortness of breath. Hypoxia. High probability for pulmonary embolism. EXAM: CT ANGIOGRAPHY CHEST WITH CONTRAST TECHNIQUE: Multidetector CT imaging of the chest was performed using the standard protocol during bolus administration of intravenous contrast. Multiplanar  CT image reconstructions and MIPs were obtained to evaluate the vascular anatomy. RADIATION DOSE REDUCTION: This exam was performed according to the departmental dose-optimization program which includes automated exposure control, adjustment of the mA and/or kV according to patient size and/or use of iterative reconstruction technique. CONTRAST:  75mL OMNIPAQUE  IOHEXOL  350 MG/ML SOLN COMPARISON:  10/15/2022 FINDINGS: Cardiovascular: Satisfactory opacification of pulmonary arteries noted, and no pulmonary emboli identified. Mediastinum/Nodes: No pathologically enlarged lymph nodes identified. 2.6 cm low-attenuation right thyroid  lobe nodule shows no significant change. Lungs/Pleura: New moderate to large pleural effusions are seen bilaterally. Compressive atelectasis seen mainly in the lower lobes. Diffuse heterogeneous ground-glass opacity throughout both lungs, likely due to pulmonary edema. Upper abdomen: No acute findings. Musculoskeletal: No suspicious bone lesions identified. Review of the MIP images confirms the above findings. IMPRESSION: No evidence of pulmonary embolism. New moderate to large bilateral pleural effusions and compressive atelectasis. Diffuse ground-glass pulmonary opacity, likely due to pulmonary edema. Stable 2.6 cm right thyroid  lobe nodule. Recommend thyroid  US  if this has not been previously evaluated. (Ref: J Am Coll Radiol. 2015 Feb;12(2): 143-50). Electronically Signed   By: Norleen DELENA Kil M.D.   On: 06/29/2023 13:43   DG Chest Portable 1 View Result Date: 06/29/2023 CLINICAL DATA:  89 year old male with shortness of breath and fever. Hypoxia. EXAM: PORTABLE CHEST 1 VIEW COMPARISON:  CTA chest 10/15/2022 and earlier. FINDINGS: Portable AP semi upright view at 0857 hours. Chronic sternotomy, TAVR. Lower lung volumes. Stable cardiac size and mediastinal contours. Visualized tracheal air column is within normal limits. Bilateral basilar predominant increased pulmonary interstitium,  indistinct vasculature. Evidence of new small right pleural effusion, chronic blunting of the left costophrenic angle. No pneumothorax or consolidation. No acute osseous abnormality identified. Paucity of bowel gas. IMPRESSION: Lower lung volumes with evidence  of new small right pleural effusion and increased interstitial opacity which more resembles acute pulmonary edema, but viral/atypical respiratory infection not excluded in the setting of fever. Electronically Signed   By: VEAR Hurst M.D.   On: 06/29/2023 09:27    Assessment and Plan:   1.  Fluid overload, potentially acute on chronic HFpEF, LVEF was 55 to 60% as of 2022, mild reduced RV contraction at that time.  He is prescribed as needed Lasix , but has not been using this over the last few months, weight up potentially 8 to 10 pounds over baseline within the last month.  This is also likely complicated by uncontrolled atrial fibrillation.  2.  Permanent atrial fibrillation with CHA2DS2-VASc score of 6, presently with RVR.  On Eliquis  as an outpatient, transitioned to Lovenox  temporarily per primary team in case he needs further invasive procedures.  Recently started on oral Lopressor  and short acting Cardizem  by Dr. Ladona and attempt at better rate control.  3.  CAD status post CABG with SVG to OM1 in 2009.  No reported angina and high-sensitivity troponin I levels are normal.  4.  History of bioprosthetic AVR at time of CABG in 2009, documentation of severe aortic regurgitation in 2019, and ultimately status post valve in valve TAVR with 23 mm Edwards SAPIEN 3 THV in July 2020.  5.  Asymptomatic ascending aortic dilatation measuring 4.4 cm as of May 2024.  Chart reviewed.  Situation discussed with the patient and his wife today.  Would continue Lasix  infusion monitoring urine output with follow-up BMET.  Obtain follow-up echocardiogram during hospital stay (he had been scheduled for one as an outpatient next week).  Need to make sure no decline  in LVEF and also for reevaluation of TAVR.  Reasonable to hold Eliquis  for now in case thoracentesis is necessary, he does have moderately large pleural effusions.  Continue present doses of oral Lopressor  and short acting Cardizem , can titrate depending on rate control of atrial fibrillation.  We will continue to follow with you.  For questions or updates, please contact [facility] Please consult [email or web address] for contact info under   Signed, Jayson Sierras, MD  06/30/2023 8:57 AM

## 2023-06-30 NOTE — Plan of Care (Signed)

## 2023-06-30 NOTE — Progress Notes (Signed)
 PROGRESS NOTE    Eric [name]  FMW:[id number] DOB: 1929/06/02 DOA: 06/29/2023 PCP: Rolinda Millman, MD   Brief Narrative:   89 y.o. 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 Ladona as an outpatient, DVT/PE, unspecified CVA, ascending aortic aneurysm and history of prostate cancer status post radical prostatectomy and bladder cancer status post TURBT presented with worsening shortness of breath (apparently not been taking Lasix  consistently for several weeks) on presentation: He was hypoxic in the 80s on room air requiring supplemental oxygen and was found to be in A-fib with RVR requiring IV diltiazem .  Chest x-ray initially was concerning for bilateral consolidation for which she was given Rocephin  and Zithromax .  CT chest showed bilateral pleural effusions with atelectasis.  He was started on Lasix  drip.  Assessment & Plan:   Probable acute on chronic diastolic heart failure presenting with fluid overload Bilateral moderate to large pleural effusions Acute respiratory failure with hypoxia -Imaging as above.  Still requiring 4 L oxygen via nasal cannula.  Wean off as able.  Currently on Lasix  drip.  Strict input and output.  Daily weights.  Fluid restriction.  Doubt that patient has pneumonia although patient is on Rocephin  for UTI -If respiratory status does not improve with diuresis, patient will need thoracentesis  Acute metabolic acidosis -Resolved  UTI: Present on admission -Follow urine cultures.  Continue Rocephin .  Permanent A-fib with RVR -Recently was put on metoprolol  and Cardizem  by Dr. Ladona.  Still intermittently tachycardic.  Continue Cardizem  and metoprolol .  Await cardiology recommendations. -Keep Eliquis  on hold in case patient needs thoracentesis  History of CAD status post  CABG -No reported chest pain.  Troponins normal  History of aortic valve replacement with bioprosthetic valve and subsequently valve in valve TAVR -Follow cardiology recommendations  Asymptomatic ascending aortic dilation -Measuring 4.4 cm as of May 2024.  Outpatient follow-up   DVT prophylaxis: Lovenox  Code Status: DNR Family Communication: Wife at bedside Disposition Plan: Status is: Inpatient Remains inpatient appropriate because: Of severity of illness  Consultants: Cardiology  Procedures: None  Antimicrobials:  Anti-infectives (From admission, onward)    Start     Dose/Rate Route Frequency Ordered Stop   06/30/23 1200  cefTRIAXone  (ROCEPHIN ) 1 g in sodium chloride  0.9 % 100 mL IVPB        1 g 200 mL/hr over 30 Minutes Intravenous Every 24 hours 06/29/23 1558 07/05/23 1159   06/29/23 1015  cefTRIAXone  (ROCEPHIN ) 1 g in sodium chloride  0.9 % 100 mL IVPB        1 g 200 mL/hr over 30 Minutes Intravenous  Once 06/29/23 1006 06/29/23 1026   06/29/23 1015  azithromycin  (ZITHROMAX ) 500 mg in sodium chloride  0.9 % 250 mL IVPB        500 mg 250 mL/hr over 60 Minutes Intravenous  Once 06/29/23 1006 06/29/23 1136        Subjective: Patient seen and examined at bedside.  Feels slightly better but still feels short breath with exertion.  No chest pain, vomiting or fever reported.  Objective: Vitals:   06/30/23 0423 06/30/23 0500 06/30/23 0855 06/30/23 0900  BP: 110/88  (!) 156/69 116/83  Pulse: 92   (!) 107  Resp: 18   (!) 21  Temp: 97.8 F (36.6 C)  98.3 F (36.8 C) 98.3 F (36.8 C)  TempSrc: Oral  Oral Oral  SpO2: 97%     Weight:  85.9 kg    Height:        Intake/Output Summary (Last 24 hours) at 06/30/2023 1101 Last data filed at 06/30/2023 1030 Gross per 24 hour  Intake 292.88 ml  Output 2300 ml  Net -2007.12 ml   Filed Weights   06/29/23 0849 06/30/23 0500  Weight: 81.6 kg 85.9 kg    Examination:  General exam: Appears calm and comfortable.  On 4 L  oxygen via nasal cannula.  Hard of hearing. Respiratory system: Bilateral decreased breath sounds at bases with scattered crackles and intermittent tachypnea Cardiovascular system: S1 & S2 heard, tachycardic  gastrointestinal system: Abdomen is nondistended, soft and nontender. Normal bowel sounds heard. Extremities: No cyanosis, clubbing: Bilateral lower extremity edema present Central nervous system: Alert and oriented. No focal neurological deficits. Moving extremities Skin: No rashes, lesions or ulcers Psychiatry: Flat affect.  Not agitated.    Data Reviewed: I have personally reviewed following labs and imaging studies  CBC: Recent Labs  Lab 06/24/23 1710 06/29/23 0848 06/29/23 1734 06/30/23 0626  WBC 7.0 8.5 7.3 6.5  HGB 14.0 14.9 14.1 13.8  HCT 41.8 44.4 42.4 40.7  MCV 95 94.3 94.4 93.1  PLT 180 201 172 173   Basic Metabolic Panel: Recent Labs  Lab 06/24/23 1710 06/29/23 0848 06/29/23 1734 06/30/23 0626  NA 139 137  --  136  K 4.4 4.0  --  3.5  CL 105 108  --  103  CO2 21 18*  --  23  GLUCOSE 96 122*  --  104*  BUN 21 16  --  18  CREATININE 1.04 0.95 1.05 1.16  CALCIUM  9.1 9.3  --  8.6*  MG  --  2.0  --  1.8   GFR: Estimated Creatinine Clearance: 45.5 mL/min (by C-G formula based on SCr of 1.16 mg/dL). Liver Function Tests: Recent Labs  Lab 06/24/23 1710 06/30/23 0626  AST 32 33  ALT 25 29  ALKPHOS 55 45  BILITOT 4.2* 5.1*  PROT 6.5 6.4*  ALBUMIN 3.9 3.5   No results for input(s): LIPASE, AMYLASE in the last 168 hours. No results for input(s): AMMONIA in the last 168 hours. Coagulation Profile: Recent Labs  Lab 06/30/23 0626  INR 1.3*   Cardiac Enzymes: No results for input(s): CKTOTAL, CKMB, CKMBINDEX, TROPONINI in the last 168 hours. BNP (last 3 results) No results for input(s): PROBNP in the last 8760 hours. HbA1C: No results for input(s): HGBA1C in the last 72 hours. CBG: No results for input(s): GLUCAP in the last  168 hours. Lipid Profile: No results for input(s): CHOL, HDL, LDLCALC, TRIG, CHOLHDL, LDLDIRECT in the last 72 hours. Thyroid  Function Tests: No results for input(s): TSH, T4TOTAL, FREET4, T3FREE, THYROIDAB in the last 72 hours. Anemia Panel: No results for input(s): VITAMINB12, FOLATE, FERRITIN, TIBC, IRON, RETICCTPCT in the last 72 hours. Sepsis Labs: Recent Labs  Lab 06/29/23 0914  LATICACIDVEN 1.6    Recent Results (from the past 240 hours)  Resp panel by RT-PCR (RSV, Flu A&B, Covid) Anterior Nasal Swab     Status: None   Collection Time: 06/29/23  8:49 AM   Specimen: Anterior Nasal Swab  Result Value Ref Range Status   SARS Coronavirus 2 by RT PCR NEGATIVE NEGATIVE Final   Influenza A by PCR NEGATIVE NEGATIVE Final   Influenza B by PCR NEGATIVE NEGATIVE Final    Comment: (NOTE) The Xpert Xpress SARS-CoV-2/FLU/RSV plus assay is intended  as an aid in the diagnosis of influenza from Nasopharyngeal swab specimens and should not be used as a sole basis for treatment. Nasal washings and aspirates are unacceptable for Xpert Xpress SARS-CoV-2/FLU/RSV testing.  Fact Sheet for Patients: bloggercourse.com  Fact Sheet for Healthcare Providers: seriousbroker.it  This test is not yet approved or cleared by the [place]  FDA and has been authorized for detection and/or diagnosis of SARS-CoV-2 by FDA under an Emergency Use Authorization (EUA). This EUA will remain in effect (meaning this test can be used) for the duration of the COVID-19 declaration under Section 564(b)(1) of the Act, 21 U.S.C. section 360bbb-3(b)(1), unless the authorization is terminated or revoked.     Resp Syncytial Virus by PCR NEGATIVE NEGATIVE Final    Comment: (NOTE) Fact Sheet for Patients: bloggercourse.com  Fact Sheet for Healthcare  Providers: seriousbroker.it  This test is not yet approved or cleared by the [place]  FDA and has been authorized for detection and/or diagnosis of SARS-CoV-2 by FDA under an Emergency Use Authorization (EUA). This EUA will remain in effect (meaning this test can be used) for the duration of the COVID-19 declaration under Section 564(b)(1) of the Act, 21 U.S.C. section 360bbb-3(b)(1), unless the authorization is terminated or revoked.  Performed at [facility], [address], [place], [place] [address]   Blood culture (routine x 2)     Status: None (Preliminary result)   Collection Time: 06/29/23  8:53 AM   Specimen: BLOOD LEFT FOREARM  Result Value Ref Range Status   Specimen Description BLOOD LEFT FOREARM  Final   Special Requests   Final    BOTTLES DRAWN AEROBIC AND ANAEROBIC Blood Culture adequate volume   Culture   Final    NO GROWTH < 24 HOURS Performed at [facility], [address] [place], [place] [address]    Report Status PENDING  Incomplete  Blood culture (routine x 2)     Status: None (Preliminary result)   Collection Time: 06/29/23  8:58 AM   Specimen: BLOOD RIGHT FOREARM  Result Value Ref Range Status   Specimen Description BLOOD RIGHT FOREARM  Final   Special Requests   Final    BOTTLES DRAWN AEROBIC AND ANAEROBIC Blood Culture adequate volume   Culture   Final    NO GROWTH < 24 HOURS Performed at [facility], [address] [place], [place] [address]    Report Status PENDING  Incomplete  Urine Culture     Status: Abnormal (Preliminary result)   Collection Time: 06/29/23  9:48 AM   Specimen: Urine, Clean Catch  Result Value Ref Range Status   Specimen Description URINE, CLEAN CATCH  Final   Special Requests NONE  Final   Culture (A)  Final    >=100,000 COLONIES/mL ESCHERICHIA COLI SUSCEPTIBILITIES TO FOLLOW Performed at [facility], [address], [place], [place] [address]    Report Status  PENDING  Incomplete         Radiology Studies: CT Angio Chest PE W and/or Wo Contrast Result Date: 06/29/2023 CLINICAL DATA:  Shortness of breath. Hypoxia. High probability for pulmonary embolism. EXAM: CT ANGIOGRAPHY CHEST WITH CONTRAST TECHNIQUE: Multidetector CT imaging of the chest was performed using the standard protocol during bolus administration of intravenous contrast. Multiplanar CT image reconstructions and MIPs were obtained to evaluate the vascular anatomy. RADIATION DOSE REDUCTION: This exam was performed according to the departmental dose-optimization program which includes automated exposure control, adjustment of the mA  and/or kV according to patient size and/or use of iterative reconstruction technique. CONTRAST:  75mL OMNIPAQUE  IOHEXOL  350 MG/ML SOLN COMPARISON:  10/15/2022 FINDINGS: Cardiovascular: Satisfactory opacification of pulmonary arteries noted, and no pulmonary emboli identified. Mediastinum/Nodes: No pathologically enlarged lymph nodes identified. 2.6 cm low-attenuation right thyroid  lobe nodule shows no significant change. Lungs/Pleura: New moderate to large pleural effusions are seen bilaterally. Compressive atelectasis seen mainly in the lower lobes. Diffuse heterogeneous ground-glass opacity throughout both lungs, likely due to pulmonary edema. Upper abdomen: No acute findings. Musculoskeletal: No suspicious bone lesions identified. Review of the MIP images confirms the above findings. IMPRESSION: No evidence of pulmonary embolism. New moderate to large bilateral pleural effusions and compressive atelectasis. Diffuse ground-glass pulmonary opacity, likely due to pulmonary edema. Stable 2.6 cm right thyroid  lobe nodule. Recommend thyroid  US  if this has not been previously evaluated. (Ref: J Am Coll Radiol. 2015 Feb;12(2): 143-50). Electronically Signed   By: Norleen DELENA Kil M.D.   On: 06/29/2023 13:43   DG Chest Portable 1 View Result Date: 06/29/2023 CLINICAL DATA:   89 year old male with shortness of breath and fever. Hypoxia. EXAM: PORTABLE CHEST 1 VIEW COMPARISON:  CTA chest 10/15/2022 and earlier. FINDINGS: Portable AP semi upright view at 0857 hours. Chronic sternotomy, TAVR. Lower lung volumes. Stable cardiac size and mediastinal contours. Visualized tracheal air column is within normal limits. Bilateral basilar predominant increased pulmonary interstitium, indistinct vasculature. Evidence of new small right pleural effusion, chronic blunting of the left costophrenic angle. No pneumothorax or consolidation. No acute osseous abnormality identified. Paucity of bowel gas. IMPRESSION: Lower lung volumes with evidence of new small right pleural effusion and increased interstitial opacity which more resembles acute pulmonary edema, but viral/atypical respiratory infection not excluded in the setting of fever. Electronically Signed   By: VEAR Hurst M.D.   On: 06/29/2023 09:27        Scheduled Meds:  diltiazem   30 mg Oral TID   enoxaparin  (LOVENOX ) injection  40 mg Subcutaneous Q24H   metoprolol  tartrate  25 mg Oral BID   sodium chloride  flush  3 mL Intravenous Q12H   Continuous Infusions:  cefTRIAXone  (ROCEPHIN )  IV     furosemide  (LASIX ) 200 mg in dextrose  5 % 100 mL (2 mg/mL) infusion 8 mg/hr (06/29/23 1816)          Sophie Mao, MD [facility] 06/30/2023, 11:01 AM

## 2023-06-30 NOTE — Plan of Care (Signed)

## 2023-07-01 ENCOUNTER — Inpatient Hospital Stay (HOSPITAL_COMMUNITY): Payer: Medicare Other

## 2023-07-01 DIAGNOSIS — I4821 Permanent atrial fibrillation: Secondary | ICD-10-CM | POA: Diagnosis not present

## 2023-07-01 DIAGNOSIS — I5033 Acute on chronic diastolic (congestive) heart failure: Secondary | ICD-10-CM | POA: Diagnosis not present

## 2023-07-01 DIAGNOSIS — E8779 Other fluid overload: Secondary | ICD-10-CM | POA: Diagnosis not present

## 2023-07-01 DIAGNOSIS — J9601 Acute respiratory failure with hypoxia: Secondary | ICD-10-CM | POA: Diagnosis not present

## 2023-07-01 LAB — ECHOCARDIOGRAM COMPLETE
AR max vel: 1.16 cm2
AV Area VTI: 1.28 cm2
AV Area mean vel: 1.16 cm2
AV Mean grad: 10.3 mm[Hg]
AV Peak grad: 19.5 mm[Hg]
Ao pk vel: 2.21 m/s
Area-P 1/2: 2.24 cm2
Height: 72 in
MV VTI: 1.16 cm2
S' Lateral: 2.5 cm
Weight: 2938.29 [oz_av]

## 2023-07-01 LAB — URINE CULTURE: Culture: >=100000 — AB

## 2023-07-01 LAB — CBC WITH DIFFERENTIAL/PLATELET
Abs Immature Granulocytes: 0.03 10*3/uL (ref 0.00–0.07)
Basophils Absolute: 0.1 10*3/uL (ref 0.0–0.1)
Basophils Relative: 1 %
Eosinophils Absolute: 0.2 10*3/uL (ref 0.0–0.5)
Eosinophils Relative: 3 %
HCT: 41.6 % (ref 39.0–52.0)
Hemoglobin: 13.9 g/dL (ref 13.0–17.0)
Immature Granulocytes: 0 %
Lymphocytes Relative: 20 %
Lymphs Abs: 1.4 10*3/uL (ref 0.7–4.0)
MCH: 31.2 pg (ref 26.0–34.0)
MCHC: 33.4 g/dL (ref 30.0–36.0)
MCV: 93.3 fL (ref 80.0–100.0)
Monocytes Absolute: 0.8 10*3/uL (ref 0.1–1.0)
Monocytes Relative: 11 %
Neutro Abs: 4.5 10*3/uL (ref 1.7–7.7)
Neutrophils Relative %: 65 %
Platelets: 176 10*3/uL (ref 150–400)
RBC: 4.46 MIL/uL (ref 4.22–5.81)
RDW: 12.9 % (ref 11.5–15.5)
WBC: 6.9 10*3/uL (ref 4.0–10.5)
nRBC: 0 % (ref 0.0–0.2)

## 2023-07-01 LAB — COMPREHENSIVE METABOLIC PANEL
ALT: 29 U/L (ref 0–44)
AST: 28 U/L (ref 15–41)
Albumin: 3.5 g/dL (ref 3.5–5.0)
Alkaline Phosphatase: 43 U/L (ref 38–126)
Anion gap: 11 (ref 5–15)
BUN: 18 mg/dL (ref 8–23)
CO2: 28 mmol/L (ref 22–32)
Calcium: 8.8 mg/dL — ABNORMAL LOW (ref 8.9–10.3)
Chloride: 98 mmol/L (ref 98–111)
Creatinine, Ser: 1.12 mg/dL (ref 0.61–1.24)
GFR, Estimated: >60 mL/min (ref >60)
Glucose, Bld: 109 mg/dL — ABNORMAL HIGH (ref 70–99)
Potassium: 3.3 mmol/L — ABNORMAL LOW (ref 3.5–5.1)
Sodium: 137 mmol/L (ref 135–145)
Total Bilirubin: 4.6 mg/dL — ABNORMAL HIGH (ref 0.0–1.2)
Total Protein: 6.4 g/dL — ABNORMAL LOW (ref 6.5–8.1)

## 2023-07-01 LAB — MAGNESIUM: Magnesium: 1.9 mg/dL (ref 1.7–2.4)

## 2023-07-01 MED ORDER — POTASSIUM CHLORIDE CRYS ER 20 MEQ PO TBCR
40.0000 meq | EXTENDED_RELEASE_TABLET | Freq: Once | ORAL | Status: AC
  Administered 2023-07-01: 40 meq via ORAL
  Filled 2023-07-01: qty 2

## 2023-07-01 MED ORDER — MAGNESIUM SULFATE 2 GM/50ML IV SOLN
2.0000 g | Freq: Once | INTRAVENOUS | Status: AC
  Administered 2023-07-01: 2 g via INTRAVENOUS
  Filled 2023-07-01: qty 50

## 2023-07-01 MED ORDER — METOPROLOL TARTRATE 50 MG PO TABS
50.0000 mg | ORAL_TABLET | Freq: Two times a day (BID) | ORAL | Status: DC
Start: 2023-07-01 — End: 2023-07-02
  Administered 2023-07-01 – 2023-07-02 (×2): 50 mg via ORAL
  Filled 2023-07-01 (×2): qty 1

## 2023-07-01 MED ORDER — FUROSEMIDE 20 MG PO TABS
20.0000 mg | ORAL_TABLET | Freq: Every day | ORAL | Status: DC
  Administered 2023-07-02: 20 mg via ORAL
  Filled 2023-07-01: qty 1

## 2023-07-01 MED ORDER — CEFADROXIL 500 MG PO CAPS
500.0000 mg | ORAL_CAPSULE | Freq: Two times a day (BID) | ORAL | Status: DC
  Administered 2023-07-01 – 2023-07-02 (×3): 500 mg via ORAL
  Filled 2023-07-01 (×3): qty 1

## 2023-07-01 MED ORDER — FUROSEMIDE 40 MG PO TABS
40.0000 mg | ORAL_TABLET | Freq: Once | ORAL | Status: AC
  Administered 2023-07-01: 40 mg via ORAL
  Filled 2023-07-01: qty 1

## 2023-07-01 NOTE — Progress Notes (Signed)
 Transition of Care [name]) - Inpatient Brief Assessment   Patient Details  Name: Eric [name] MRN: [id number] Date of Birth: 05-06-1930  [facility] CM/SW Contact:    Dane Dung, RN Phone Number: 07/01/2023, 3:25 PM   Clinical Narrative: CM met with the patient and son at the bedside to discuss TOC needs.  The patient admitted to the hospital for fluid overload and plans to return home when medically stable for discharge.   DME at the home includes bathroom scale for daily weights, RW, Cane Crutches, WC, shower seat and 3:1.  The patient states that he is currently seeing a Systems analyst at [employer] and plans to have therapy continue with the trainer.  Patient declines OUtpatient PT at this time.  No other TOC needs at this time and patient should return home when medically stable for discharge.     Transition of Care Asessment: Insurance and Status: (P) Insurance coverage has been reviewed Patient has primary care physician: (P) Yes Home environment has been reviewed: (P) home with family Prior level of function:: (P) Inpdendent Prior/Current Home Services: (P) No current home services Social Drivers of Health Review: (P) SDOH reviewed needs interventions Readmission risk has been reviewed: (P) Yes Transition of care needs: (P) transition of care needs identified, TOC will continue to follow

## 2023-07-01 NOTE — Plan of Care (Signed)

## 2023-07-01 NOTE — Progress Notes (Signed)
 PROGRESS NOTE    Eric [name]  WUJ:[id number] DOB: 1929/09/09 DOA: 06/29/2023 PCP: Dorena Gander, MD   Brief Narrative:   89 y.o. 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 Berry Bristol as an outpatient, DVT/PE, unspecified CVA, ascending aortic aneurysm and history of prostate cancer status post radical prostatectomy and bladder cancer status post TURBT presented with worsening shortness of breath (apparently not been taking Lasix  consistently for several weeks) on presentation: He was hypoxic in the 80s on room air requiring supplemental oxygen and was found to be in A-fib with RVR requiring IV diltiazem .  Chest x-ray initially was concerning for bilateral consolidation for which she was given Rocephin  and Zithromax .  CT chest showed bilateral pleural effusions with atelectasis.  He was started on Lasix  drip.  Cardiology was consulted.  Assessment & Plan:   Probable acute on chronic diastolic heart failure presenting with fluid overload Bilateral moderate to large pleural effusions Acute respiratory failure with hypoxia -Imaging as above.  Continues to be on 4 L oxygen via nasal cannula.  Wean off as able.  Currently on Lasix  drip.  Strict input and output.  Daily weights.  Fluid restriction.  Negative balance of 5157.1 cc since admission.  Doubt that patient has pneumonia although patient is on Rocephin  for UTI -If respiratory status does not improve with diuresis, patient will need thoracentesis -Cardiology following.  Follow recommendations.  Acute metabolic acidosis -Resolved  UTI: Present on admission -Urine cultures growing E. coli: Follow susceptibilities..  Continue Rocephin .  Permanent A-fib with RVR -Recently was put on metoprolol  and Cardizem  by Dr. Berry Bristol.  Still intermittently  tachycardic.  Continue Cardizem  and metoprolol .  Cardiology following. -Keep Eliquis  on hold in case patient needs thoracentesis  History of CAD status post CABG -No reported chest pain.  Troponins normal  History of aortic valve replacement with bioprosthetic valve and subsequently valve in valve TAVR -Follow cardiology recommendations  Hypokalemia -Replace.  Repeat a.m. labs.  Asymptomatic ascending aortic dilation -Measuring 4.4 cm as of May 2024.  Outpatient follow-up   DVT prophylaxis: Lovenox  Code Status: DNR Family Communication: Wife at bedside Disposition Plan: Status is: Inpatient Remains inpatient appropriate because: Of severity of illness  Consultants: Cardiology  Procedures: None  Antimicrobials:  Anti-infectives (From admission, onward)    Start     Dose/Rate Route Frequency Ordered Stop   06/30/23 1200  cefTRIAXone  (ROCEPHIN ) 1 g in sodium chloride  0.9 % 100 mL IVPB        1 g 200 mL/hr over 30 Minutes Intravenous Every 24 hours 06/29/23 1558 07/05/23 1159   06/29/23 1015  cefTRIAXone  (ROCEPHIN ) 1 g in sodium chloride  0.9 % 100 mL IVPB        1 g 200 mL/hr over 30 Minutes Intravenous  Once 06/29/23 1006 06/29/23 1026   06/29/23 1015  azithromycin  (ZITHROMAX ) 500 mg in sodium chloride  0.9 % 250 mL IVPB        500 mg 250 mL/hr over 60 Minutes Intravenous  Once 06/29/23 1006 06/29/23 1136        Subjective: Patient seen and examined at bedside.  No fever, vomiting, chest pain reported.  Still complains of intermittent exertional shortness of breath. Objective: Vitals:   06/30/23 2136 07/01/23 0001 07/01/23 0501 07/01/23 0504  BP: 106/85 104/72 118/81   Pulse: 91 82 84   Resp: 18  18 18   Temp: 98.1 F (36.7 C) (!) 97.4 F (36.3 C) (!) 97.5 F (36.4 C)   TempSrc: Oral Oral Oral   SpO2: 97% 97% 97%   Weight:    83.3 kg  Height:        Intake/Output Summary (Last 24 hours) at 07/01/2023 0650 Last data filed at 07/01/2023 0500 Gross per 24 hour   Intake --  Output 3850 ml  Net -3850 ml   Filed Weights   06/29/23 0849 06/30/23 0500 07/01/23 0504  Weight: 81.6 kg 85.9 kg 83.3 kg    Examination:  General: Currently on 4 L oxygen via nasal cannula.  No distress.  Hard of hearing. ENT/neck: No thyromegaly.  JVD is not elevated  respiratory: Decreased breath sounds at bases bilaterally with some crackles; no wheezing  CVS: S1-S2 heard, remains tachycardic  abdominal: Soft, nontender, slightly distended; no organomegaly,  bowel sounds are heard Extremities: Trace lower extremity edema; no cyanosis  CNS: Awake and alert.  No focal neurologic deficit.  Moves extremities Lymph: No obvious lymphadenopathy Skin: No obvious ecchymosis/lesions  psych: Mostly flat affect.  Not agitated currently.   Musculoskeletal: No obvious joint swelling/deformity     Data Reviewed: I have personally reviewed following labs and imaging studies  CBC: Recent Labs  Lab 06/24/23 1710 06/29/23 0848 06/29/23 1734 06/30/23 0626  WBC 7.0 8.5 7.3 6.5  HGB 14.0 14.9 14.1 13.8  HCT 41.8 44.4 42.4 40.7  MCV 95 94.3 94.4 93.1  PLT 180 201 172 173   Basic Metabolic Panel: Recent Labs  Lab 06/24/23 1710 06/29/23 0848 06/29/23 1734 06/30/23 0626 06/30/23 1611  NA 139 137  --  136 137  K 4.4 4.0  --  3.5 3.4*  CL 105 108  --  103 100  CO2 21 18*  --  23 25  GLUCOSE 96 122*  --  104* 102*  BUN 21 16  --  18 23  CREATININE 1.04 0.95 1.05 1.16 1.30*  CALCIUM  9.1 9.3  --  8.6* 9.1  MG  --  2.0  --  1.8  --    GFR: Estimated Creatinine Clearance: 40.6 mL/min (A) (by C-G formula based on SCr of 1.3 mg/dL (H)). Liver Function Tests: Recent Labs  Lab 06/24/23 1710 06/30/23 0626  AST 32 33  ALT 25 29  ALKPHOS 55 45  BILITOT 4.2* 5.1*  PROT 6.5 6.4*  ALBUMIN 3.9 3.5   No results for input(s): "LIPASE", "AMYLASE" in the last 168 hours. No results for input(s): "AMMONIA" in the last 168 hours. Coagulation Profile: Recent Labs  Lab  06/30/23 0626  INR 1.3*   Cardiac Enzymes: No results for input(s): "CKTOTAL", "CKMB", "CKMBINDEX", "TROPONINI" in the last 168 hours. BNP (last 3 results) No results for input(s): "PROBNP" in the last 8760 hours. HbA1C: No results for input(s): "HGBA1C" in the last 72 hours. CBG: No results for input(s): "GLUCAP" in the last 168 hours. Lipid Profile: No results for input(s): "CHOL", "HDL", "LDLCALC", "TRIG", "CHOLHDL", "LDLDIRECT" in the last 72 hours. Thyroid  Function Tests: No results for input(s): "TSH", "T4TOTAL", "FREET4", "T3FREE", "THYROIDAB" in the last 72 hours. Anemia Panel: No results for input(s): "VITAMINB12", "FOLATE", "FERRITIN", "TIBC", "IRON", "RETICCTPCT" in the last 72 hours. Sepsis Labs: Recent Labs  Lab 06/29/23 0914  LATICACIDVEN 1.6    Recent Results (from the past 240 hours)  Resp panel by RT-PCR (RSV, Flu A&B, Covid) Anterior Nasal Swab     Status: None   Collection Time:  06/29/23  8:49 AM   Specimen: Anterior Nasal Swab  Result Value Ref Range Status   SARS Coronavirus 2 by RT PCR NEGATIVE NEGATIVE Final   Influenza A by PCR NEGATIVE NEGATIVE Final   Influenza B by PCR NEGATIVE NEGATIVE Final    Comment: (NOTE) The Xpert Xpress SARS-CoV-2/FLU/RSV plus assay is intended as an aid in the diagnosis of influenza from Nasopharyngeal swab specimens and should not be used as a sole basis for treatment. Nasal washings and aspirates are unacceptable for Xpert Xpress SARS-CoV-2/FLU/RSV testing.  Fact Sheet for Patients: BloggerCourse.com  Fact Sheet for Healthcare Providers: SeriousBroker.it  This test is not yet approved or cleared by the [place]  FDA and has been authorized for detection and/or diagnosis of SARS-CoV-2 by FDA under an Emergency Use Authorization (EUA). This EUA will remain in effect (meaning this test can be used) for the duration of the COVID-19 declaration under Section  564(b)(1) of the Act, 21 U.S.C. section 360bbb-3(b)(1), unless the authorization is terminated or revoked.     Resp Syncytial Virus by PCR NEGATIVE NEGATIVE Final    Comment: (NOTE) Fact Sheet for Patients: BloggerCourse.com  Fact Sheet for Healthcare Providers: SeriousBroker.it  This test is not yet approved or cleared by the [place]  FDA and has been authorized for detection and/or diagnosis of SARS-CoV-2 by FDA under an Emergency Use Authorization (EUA). This EUA will remain in effect (meaning this test can be used) for the duration of the COVID-19 declaration under Section 564(b)(1) of the Act, 21 U.S.C. section 360bbb-3(b)(1), unless the authorization is terminated or revoked.  Performed at [facility], [address] [place], [place] [address]   Blood culture (routine x 2)     Status: None (Preliminary result)   Collection Time: 06/29/23  8:53 AM   Specimen: BLOOD LEFT FOREARM  Result Value Ref Range Status   Specimen Description BLOOD LEFT FOREARM  Final   Special Requests   Final    BOTTLES DRAWN AEROBIC AND ANAEROBIC Blood Culture adequate volume   Culture   Final    NO GROWTH < 24 HOURS Performed at [facility], [address] [place], [place] [address]    Report Status PENDING  Incomplete  Blood culture (routine x 2)     Status: None (Preliminary result)   Collection Time: 06/29/23  8:58 AM   Specimen: BLOOD RIGHT FOREARM  Result Value Ref Range Status   Specimen Description BLOOD RIGHT FOREARM  Final   Special Requests   Final    BOTTLES DRAWN AEROBIC AND ANAEROBIC Blood Culture adequate volume   Culture   Final    NO GROWTH < 24 HOURS Performed at [facility], [address], [place], [place] [address]    Report Status PENDING  Incomplete  Urine Culture     Status: Abnormal (Preliminary result)   Collection Time: 06/29/23  9:48 AM   Specimen: Urine, Clean Catch  Result Value  Ref Range Status   Specimen Description URINE, CLEAN CATCH  Final   Special Requests NONE  Final   Culture (A)  Final    >=100,000 COLONIES/mL ESCHERICHIA COLI SUSCEPTIBILITIES TO FOLLOW Performed at [facility], [address] [place], [place] [address]    Report Status PENDING  Incomplete         Radiology Studies: CT Angio Chest PE W and/or Wo Contrast Result Date: 06/29/2023 CLINICAL DATA:  Shortness of breath. Hypoxia. High probability for pulmonary embolism. EXAM: CT  ANGIOGRAPHY CHEST WITH CONTRAST TECHNIQUE: Multidetector CT imaging of the chest was performed using the standard protocol during bolus administration of intravenous contrast. Multiplanar CT image reconstructions and MIPs were obtained to evaluate the vascular anatomy. RADIATION DOSE REDUCTION: This exam was performed according to the departmental dose-optimization program which includes automated exposure control, adjustment of the mA and/or kV according to patient size and/or use of iterative reconstruction technique. CONTRAST:  75mL OMNIPAQUE  IOHEXOL  350 MG/ML SOLN COMPARISON:  10/15/2022 FINDINGS: Cardiovascular: Satisfactory opacification of pulmonary arteries noted, and no pulmonary emboli identified. Mediastinum/Nodes: No pathologically enlarged lymph nodes identified. 2.6 cm low-attenuation right thyroid  lobe nodule shows no significant change. Lungs/Pleura: New moderate to large pleural effusions are seen bilaterally. Compressive atelectasis seen mainly in the lower lobes. Diffuse heterogeneous ground-glass opacity throughout both lungs, likely due to pulmonary edema. Upper abdomen: No acute findings. Musculoskeletal: No suspicious bone lesions identified. Review of the MIP images confirms the above findings. IMPRESSION: No evidence of pulmonary embolism. New moderate to large bilateral pleural effusions and compressive atelectasis. Diffuse ground-glass pulmonary opacity, likely due to pulmonary edema. Stable 2.6  cm right thyroid  lobe nodule. Recommend thyroid  US  if this has not been previously evaluated. (Ref: J Am Coll Radiol. 2015 Feb;12(2): 143-50). Electronically Signed   By: Marlyce Sine M.D.   On: 06/29/2023 13:43   DG Chest Portable 1 View Result Date: 06/29/2023 CLINICAL DATA:  89 year old male with shortness of breath and fever. Hypoxia. EXAM: PORTABLE CHEST 1 VIEW COMPARISON:  CTA chest 10/15/2022 and earlier. FINDINGS: Portable AP semi upright view at 0857 hours. Chronic sternotomy, TAVR. Lower lung volumes. Stable cardiac size and mediastinal contours. Visualized tracheal air column is within normal limits. Bilateral basilar predominant increased pulmonary interstitium, indistinct vasculature. Evidence of new small right pleural effusion, chronic blunting of the left costophrenic angle. No pneumothorax or consolidation. No acute osseous abnormality identified. Paucity of bowel gas. IMPRESSION: Lower lung volumes with evidence of new small right pleural effusion and increased interstitial opacity which more resembles acute pulmonary edema, but viral/atypical respiratory infection not excluded in the setting of fever. Electronically Signed   By: Marlise Simpers M.D.   On: 06/29/2023 09:27        Scheduled Meds:  diltiazem   30 mg Oral TID   enoxaparin  (LOVENOX ) injection  40 mg Subcutaneous Q24H   metoprolol  tartrate  25 mg Oral BID   sodium chloride  flush  3 mL Intravenous Q12H   Continuous Infusions:  cefTRIAXone  (ROCEPHIN )  IV 1 g (06/30/23 1208)   furosemide  (LASIX ) 200 mg in dextrose  5 % 100 mL (2 mg/mL) infusion 8 mg/hr (06/30/23 1616)          Audria Leather, MD [facility] 07/01/2023, 6:50 AM

## 2023-07-01 NOTE — Progress Notes (Signed)
 Mobility Specialist Progress Note:   07/01/23 1544  Mobility  Activity Ambulated with assistance in hallway  Level of Assistance Contact guard assist, steadying assist  Assistive Device Front wheel walker  Distance Ambulated (ft) 200 ft  Activity Response Tolerated well  Mobility Referral Yes  Mobility visit 1 Mobility  Mobility Specialist Start Time (ACUTE ONLY) 1530  Mobility Specialist Stop Time (ACUTE ONLY) 1540  Mobility Specialist Time Calculation (min) (ACUTE ONLY) 10 min   Pt received in bed, son at bedside. Agreeable to mobility session. Ambulated in hallway with MinG and RW. Tolerated well, SpO2 93% on RA during session. Pt has Afib, HR ranged 120-160 bpm during ambulation. Returned to room, lying in bed with all needs met.   [place] Mobility Specialist Please contact via Special educational needs teacher or  Rehab office at [phone]

## 2023-07-01 NOTE — Evaluation (Signed)
 Physical Therapy Evaluation Patient Details Name: Eric [name] MRN: [id number] DOB: 02/01/1930 Today's Date: 07/01/2023  History of Present Illness  89 y.o. male presents to [facility] hospital on 06/29/2023 with worsening SOB, found to be in afib with RVR. Pt admitted with concern for acute on chronic diastolic heart failure with fluid overload. PMH includes TAVR, CABG, afib, DVT/PE, CVA, prostate CA.  Clinical Impression  Pt presents to PT with deficits in balance and gait. Pt's activity tolerance is much improved compared to when arriving to the hospital per the patient and patient's son. Pt is able to ambulate for household distances with support of RW for stability. Pt does ambulate for a few feet without DME and demonstrates short shuffling gait with high guard. Pt will benefit from frequent mobilization in an effort to improve balance and endurance. Pt denies DOE when ambulating on room air, sats maintained from 91-94%. HR does remain in afib throughout session with rate in 130s during activity. Outpatient PT will be beneficial for balance training.      If plan is discharge home, recommend the following: A little help with bathing/dressing/bathroom;Assistance with cooking/housework;Assist for transportation;Help with stairs or ramp for entrance   Can travel by private vehicle        Equipment Recommendations None recommended by PT  Recommendations for Other Services       Functional Status Assessment Patient has had a recent decline in their functional status and demonstrates the ability to make significant improvements in function in a reasonable and predictable amount of time.     Precautions / Restrictions Precautions Precautions: Fall Precaution Comments: afib with RVR Restrictions Weight Bearing Restrictions Per Provider Order: No      Mobility  Bed Mobility Overal bed mobility: Modified Independent                  Transfers Overall transfer level: Needs  assistance Equipment used: None, Rolling walker (2 wheels) Transfers: Sit to/from Stand Sit to Stand: Supervision                Ambulation/Gait Ambulation/Gait assistance: Supervision Gait Distance (Feet): 200 Feet Assistive device: Rolling walker (2 wheels) Gait Pattern/deviations: Step-through pattern Gait velocity: reduced Gait velocity interpretation: <1.8 ft/sec, indicate of risk for recurrent falls   General Gait Details: slowed step-through gait with RW. Pt initially walking 3' without DME and demonstrates shuffling steps with high guard, DME use encouraged  Stairs            Wheelchair Mobility     Tilt Bed    Modified Rankin (Stroke Patients Only)       Balance Overall balance assessment: Needs assistance Sitting-balance support: No upper extremity supported, Feet supported Sitting balance-Leahy Scale: Good     Standing balance support: Single extremity supported, Reliant on assistive device for balance Standing balance-Leahy Scale: Poor                               Pertinent Vitals/Pain Pain Assessment Pain Assessment: No/denies pain    Home Living Family/patient expects to be discharged to:: Private residence Living Arrangements: Spouse/significant other Available Help at Discharge: Family;Available 24 hours/day Type of Home: House Home Access: Stairs to enter Entrance Stairs-Rails: Can reach both Entrance Stairs-Number of Steps: 3   Home Layout: Multi-level;Able to live on main level with bedroom/bathroom Home Equipment: Rolling Walker (2 wheels);Cane - single point;Crutches;Wheelchair - manual;Shower seat - built in;BSC/3in1  Prior Function Prior Level of Function : Independent/Modified Independent             Mobility Comments: ambulatory without DME, working with a Systems analyst 2x a week ADLs Comments: has not driven since his CVA     Extremity/Trunk Assessment   Upper Extremity Assessment Upper  Extremity Assessment: Overall WFL for tasks assessed    Lower Extremity Assessment Lower Extremity Assessment: Generalized weakness    Cervical / Trunk Assessment Cervical / Trunk Assessment: Normal  Communication   Communication Communication: Hearing impairment Cueing Techniques: Verbal cues  Cognition Arousal: Alert Behavior During Therapy: WFL for tasks assessed/performed Overall Cognitive Status: Impaired/Different from baseline Area of Impairment: Memory                     Memory: Decreased short-term memory                  General Comments General comments (skin integrity, edema, etc.): pt in afib, rate up to 130s on heart monitor during session although pulse ox monitor reading with higher rate at times. Pt on 4L [place] initially, PT weans to room air. Pt sats ranging from 91-94%, pt denies DOE when mobilizing or at rest.    Exercises     Assessment/Plan    PT Assessment Patient needs continued PT services  PT Problem List Decreased activity tolerance;Cardiopulmonary status limiting activity;Decreased mobility;Decreased balance       PT Treatment Interventions DME instruction;Gait training;Stair training;Functional mobility training;Therapeutic activities;Therapeutic exercise;Balance training;Neuromuscular re-education;Patient/family education    PT Goals (Current goals can be found in the Care Plan section)  Acute Rehab PT Goals Patient Stated Goal: to return home PT Goal Formulation: With patient Time For Goal Achievement: 07/15/23 Potential to Achieve Goals: Good    Frequency Min 1X/week     Co-evaluation               AM-PAC PT "6 Clicks" Mobility  Outcome Measure Help needed turning from your back to your side while in a flat bed without using bedrails?: None Help needed moving from lying on your back to sitting on the side of a flat bed without using bedrails?: None Help needed moving to and from a bed to a chair (including a  wheelchair)?: A Little Help needed standing up from a chair using your arms (e.g., wheelchair or bedside chair)?: A Little Help needed to walk in hospital room?: A Little Help needed climbing 3-5 steps with a railing? : A Little 6 Click Score: 20    End of Session   Activity Tolerance: Patient tolerated treatment well Patient left: in bed;with call bell/phone within reach;with family/visitor present Nurse Communication: Mobility status PT Visit Diagnosis: Other abnormalities of gait and mobility (R26.89)    Time: 1191-4782 PT Time Calculation (min) (ACUTE ONLY): 34 min   Charges:   PT Evaluation $PT Eval Low Complexity: 1 Low   PT General Charges $$ ACUTE PT VISIT: 1 Visit         Rexie Catena, PT, DPT Acute Rehabilitation Office [phone]   Rexie Catena 07/01/2023, 12:18 PM

## 2023-07-01 NOTE — Progress Notes (Addendum)
 Patient Name: Eric [name] Date of Encounter: 07/01/2023 [facility] Cardiologist: Knox Perl, Eric [name]    Interval Summary  .    Patient reports improvement in symptoms overall. He was able to walk in the halls without getting short of breath and he is no longer on supplemental oxygen. His ankle swelling has improved. Denies palpitations.   Vital Signs .    Vitals:   07/01/23 0001 07/01/23 0501 07/01/23 0504 07/01/23 0815  BP: 104/72 118/81  103/88  Pulse: 82 84  75  Resp: 18 18    Temp: (!) 97.4 F (36.3 C) (!) 97.5 F (36.4 C)  98.3 F (36.8 C)  TempSrc: Oral Oral  Oral  SpO2: 97% 97%  96%  Weight:   83.3 kg   Height:        Intake/Output Summary (Last 24 hours) at 07/01/2023 1103 Last data filed at 07/01/2023 0500 Gross per 24 hour  Intake --  Output 2550 ml  Net -2550 ml      07/01/2023    5:04 AM 06/30/2023    5:00 AM 06/29/2023    8:49 AM  Last 3 Weights  Weight (lbs) 183 lb 10.3 oz 189 lb 6 oz 180 lb  Weight (kg) 83.3 kg 85.9 kg 81.647 kg      Telemetry/ECG    Atrial fibrillation, HR in the 100s-120s - Personally Reviewed  Physical Exam .   GEN: No acute distress.  Sitting comfortably on the side of the bed  Neck: No JVD Cardiac:  Irregular rate and rhythm, tachycardic. Grade 2/6 systolic murmur  Respiratory: Diminished breath sounds and crackles in lung bases. Normal WOB on room air  GI: Soft, nontender, non-distended  MS: 3+ edema in BLE   Assessment & Plan .     Attending revisions in italics  Acute on Chronic HFpEF  - Previous echocardiogram in 2022 showed EF 55-60%, mildly reduced RV contraction  - Patient presented complaining of weight gain, lower extremity swelling, shortness of breath. BNP 225 - CTA chest showed moderate-large bilateral pleural effusions and compressive atelectasis  - Now on IV lasix  infusion. Put out 3.15 L urine yesterday. Renal function stable  - mild continued volume overload. However may be somewhat  intravascularly dry, he has hyperdynamic LV function on echo and RA pressure estimate is 3 mmHg. Would discontinue IV lasix  infusion and will start lasix  40 mg po now, weaned off of O2 and ambulated well without diuresis. Start lasix  20 mg daily tomorrow.    Permanent Atrial Fibrillation  - Presented in afib with RVR, HR in the 110s  - Now on diltaizem 30 mg TID, metoprolol  tartrate 25 mg BID  - HR remains elevated to the 110s-120s. He denies dizziness, syncope, near syncope  - BP low-normal, 103/88 this AM  - Increase metoprolol  tartrate to 50 mg BID  - Continue diltiazem  30 mg TID - Suspect HR will improve as he approaches euvolemia  - Patient was on eliquis  PTA. Consider transition back to eliquis  now that patient does not seem to require thoracentesis.    CAD s/p CABG 2009 - No chest pain  - Not on ASA given eliquis  use - Continue home lipitor 20 mg daily  - Continue metoprolol  tartrate 25 mg BID   History of bioprosthetic AVR - Patient previously had bioprosthetic AVR in 2009 at time of CABg. 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 cm in 09/2022 - Echo pending this admission   For questions or updates, please contact [facility] Please consult [email or web address] for contact info under        Signed, Debria Fang, PA-C   Patient seen and examined with KJ PA-C.  Agree as above, with the following exceptions and changes as noted below. Feels improved, not back to baseline. Gen: NAD, CV: RRR, loud 3/6 murmur, Lungs: grossly clear, Abd: soft, Extrem: Warm, well perfused, mild bilateral edema, Neuro/Psych: alert and oriented x 3, normal mood and affect. All available labs, radiology testing, previous records reviewed.  My revisions are in italics as above.  Patient is nearing euvolemia with no longer needing supplemental O2, ambulating without significant  symptoms, and diuresing appropriately.  Normal renal function.  Loud murmur on exam but likely secondary to patient prosthesis mismatch of valve in valve TAVR with no concerning findings on echocardiogram, valve overall appears normal and gradient is not significantly elevated.  There is also at least mild mitral valve stenosis of the repaired mitral valve, V-max is 2 m/s of the mitral prosthesis diastolic flow, better heart rate control will improve this as well.  Severe biatrial enlargement, hyperdynamic LV function, mild to moderate RV dilation.  Echo independently reviewed.  RA pressure estimate 3 mmHg  Findings overall suggestive the patient has diuresed well, now with hyperdynamic LV function and grossly normal RA pressure.  He can likely be taken off of the IV Lasix  infusion and transition to oral diuretic with normal renal function.  He clinically has improved though he does have some residual lower extremity edema.  Encourage continued ambulation, if he tolerates oral diuresis today without needing resupplementation of O2 [place], may be ready for hospital discharge in 24 to 48 hours.  He understands compliance with diuretic at home is beneficial especially in the setting of valvular heart disease and atrial fibrillation.  Eric Sites A Hanin Decook, Eric [name] 07/01/23 2:16 PM

## 2023-07-02 ENCOUNTER — Ambulatory Visit (HOSPITAL_COMMUNITY): Payer: Medicare Other

## 2023-07-02 DIAGNOSIS — I5033 Acute on chronic diastolic (congestive) heart failure: Secondary | ICD-10-CM | POA: Diagnosis not present

## 2023-07-02 DIAGNOSIS — J9601 Acute respiratory failure with hypoxia: Secondary | ICD-10-CM | POA: Diagnosis not present

## 2023-07-02 DIAGNOSIS — E8770 Fluid overload, unspecified: Secondary | ICD-10-CM | POA: Diagnosis not present

## 2023-07-02 LAB — BASIC METABOLIC PANEL
Anion gap: 11 (ref 5–15)
BUN: 23 mg/dL (ref 8–23)
CO2: 28 mmol/L (ref 22–32)
Calcium: 8.7 mg/dL — ABNORMAL LOW (ref 8.9–10.3)
Chloride: 96 mmol/L — ABNORMAL LOW (ref 98–111)
Creatinine, Ser: 1.05 mg/dL (ref 0.61–1.24)
GFR, Estimated: >60 mL/min (ref >60)
Glucose, Bld: 101 mg/dL — ABNORMAL HIGH (ref 70–99)
Potassium: 3.3 mmol/L — ABNORMAL LOW (ref 3.5–5.1)
Sodium: 135 mmol/L (ref 135–145)

## 2023-07-02 LAB — MAGNESIUM: Magnesium: 2.3 mg/dL (ref 1.7–2.4)

## 2023-07-02 MED ORDER — APIXABAN 5 MG PO TABS: 5.0000 mg | ORAL_TABLET | Freq: Two times a day (BID) | ORAL | Status: DC

## 2023-07-02 MED ORDER — POTASSIUM CHLORIDE CRYS ER 20 MEQ PO TBCR
40.0000 meq | EXTENDED_RELEASE_TABLET | Freq: Every day | ORAL | 0 refills | Status: DC
Start: 2023-07-02 — End: 2023-07-24

## 2023-07-02 MED ORDER — CEFADROXIL 500 MG PO CAPS: 500.0000 mg | ORAL_CAPSULE | Freq: Two times a day (BID) | ORAL | 0 refills | Status: AC

## 2023-07-02 MED ORDER — POTASSIUM CHLORIDE CRYS ER 20 MEQ PO TBCR
40.0000 meq | EXTENDED_RELEASE_TABLET | ORAL | Status: AC
  Administered 2023-07-02 (×2): 40 meq via ORAL
  Filled 2023-07-02 (×2): qty 2

## 2023-07-02 MED ORDER — FUROSEMIDE 20 MG PO TABS: 20.0000 mg | ORAL_TABLET | Freq: Every day | ORAL | Status: DC

## 2023-07-02 MED ORDER — METOPROLOL TARTRATE 50 MG PO TABS: 50.0000 mg | ORAL_TABLET | Freq: Two times a day (BID) | ORAL | 0 refills | Status: DC

## 2023-07-02 NOTE — Plan of Care (Signed)

## 2023-07-02 NOTE — Care Management Important Message (Signed)
Important Message  Patient Details  Name: Eric [name] MRN: [id number] Date of Birth: December 26, 1929   Important Message Given:  Yes - Medicare IM  Patient left prior to IM delivery will mail to the patient home address   Dorena Bodo 07/02/2023, 2:50 PM

## 2023-07-02 NOTE — Progress Notes (Signed)
Patient Name: Eric [name] Date of Encounter: 07/02/2023 [facility] Cardiologist: Yates Decamp, MD   Interval Summary  .    Patient report feeling well this AM. No chest pain, shortness of breath, palpitations. HR is well controlled per telemetry. He is hoping to go home today   Vital Signs .    Vitals:   07/01/23 1959 07/02/23 0347 07/02/23 0414 07/02/23 0826  BP: 104/74 116/82  137/82  Pulse: 94 87  (!) 103  Resp: 18 18  16   Temp: 98.4 F (36.9 C) 99 F (37.2 C)  98.7 F (37.1 C)  TempSrc: Oral Oral  Oral  SpO2: 93% 97%  95%  Weight:   82.6 kg   Height:        Intake/Output Summary (Last 24 hours) at 07/02/2023 1034 Last data filed at 07/02/2023 0829 Gross per 24 hour  Intake 240 ml  Output 480 ml  Net -240 ml      07/02/2023    4:14 AM 07/01/2023    5:04 AM 06/30/2023    5:00 AM  Last 3 Weights  Weight (lbs) 182 lb 1.6 oz 183 lb 10.3 oz 189 lb 6 oz  Weight (kg) 82.6 kg 83.3 kg 85.9 kg      Telemetry/ECG    Atrial fibrillation, HR in the 70s-80s - Personally Reviewed  Physical Exam .   GEN: No acute distress.  Sitting upright in the bed Neck: No JVD Cardiac: Irregular rate and rhythm, grade 2/6 systolic murmur at RUSB and at apex  Respiratory: Clear to auscultation bilaterally. Normal WOB on room air  GI: Soft, nontender, non-distended  MS:  Trace edema in BLE   Assessment & Plan .     Acute on Chronic HFpEF  - Patient presented complaining of weight gain, lower extremity swelling, shortness of breath. BNP 225 - CTA chest showed moderate-large bilateral pleural effusions and compressive atelectasis  - He was treated with IV lasix infusion which was stopped yesterday. He is currently net -5.4 L since admission. Weight down from 189.4 lbs to 182.1 lbs  - Now on PO lasix 20 mg daily. Appears euvolemic on exam  - Continue lasix 20 mg daily. K 3.3, supplementation ordered by primary team this AM  - Echo this admission showed EF 70-75%, no regional  wall motion abnormalities, mild LVH, normal RV function, mild MS - Discussed low sodium diets, fluid restrictions    Permanent Atrial Fibrillation  - Presented in afib with RVR, HR in the 110s  - Now on diltiazem 30 mg TID, metoprolol tartrate 50 mg BID  - Per telemetry, HR well controlled in the 70s-80s  - Continue eliquis 5 mg Bid- this is the correct dose for him    CAD s/p CABG 2009 - No chest pain  - Not on ASA given eliquis use - Continue home lipitor 20 mg daily  - Continue metoprolol tartrate 50 mg BID    History of aortic and mitral valve disease - Patient previously had bioprosthetic AVR and MVR in 2009 at time of CABG. 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  Echo findings were also consistent with normal function and structure of the TAVR valve   Asymptomatic Ascending Aortic Dilation  - Previously 4.2. cm in 09/2022 - Echo this admission showed mild dilatation of the ascending aorta measuring  41 mm  Patient has follow up with Dr. Jacinto Halim on 3/5   For questions or updates, please contact [facility] Please consult [email or web address] for contact info under        Signed, Jonita Albee, PA-C

## 2023-07-02 NOTE — Discharge Summary (Signed)
Physician Discharge Summary  Eric [name] ZOX:[id number] DOB: 1929/08/15 DOA: 06/29/2023  PCP: Eric Beams, MD  Admit date: 06/29/2023 Discharge date: 07/02/2023  Admitted From: Home Disposition: Home  Recommendations for Outpatient Follow-up:  Follow up with PCP in 1 week with repeat CBC/BMP Outpatient follow-up with cardiology Follow up in ED if symptoms worsen or new appear   Home Health: No Equipment/Devices: None  Discharge Condition: Stable CODE STATUS: DNR Diet recommendation: Heart healthy/fluid restriction of up to 1500 cc a day  Brief/Interim Summary: 89 y.o. 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 Jacinto Halim as an outpatient, DVT/PE, unspecified CVA, ascending aortic aneurysm and history of prostate cancer status post radical prostatectomy and bladder cancer status post TURBT presented with worsening shortness of breath (apparently not been taking Lasix consistently for several weeks) on presentation: He was hypoxic in the 80s on room air requiring supplemental oxygen and was found to be in A-fib with RVR requiring IV diltiazem.  Chest x-ray initially was concerning for bilateral consolidation for which he was given Rocephin and Zithromax.  CT chest showed bilateral pleural effusions with atelectasis.  He was started on Lasix drip.  Cardiology was consulted.  During the hospitalization, he has diuresed well.  Urine culture grew E. coli: Rocephin has been switched to cefadroxil.  Cardiology has switched him to oral Lasix and has cleared him for discharge.  He is currently on room air and feels much better and wants to go home today.  He will be discharged home today on oral Lasix and 4 more days of oral cefadroxil.  Outpatient follow-up with PCP and cardiology.  Discharge Diagnoses:    Probable acute on chronic diastolic heart failure presenting with fluid overload Bilateral moderate to large pleural effusions Acute respiratory failure with hypoxia -Imaging as above.  Treated with Lasix drip.  Echo showed EF of 70 to 75% with normal function of prosthetic aortic valve.  Continue diet and fluid restriction.  Negative balance of 5397.1 cc since admission.   -Respiratory status has much improved and currently on room air.  No need for thoracentesis.  Resume Eliquis. -Cardiology has switched him to oral Lasix and has cleared him for discharge. -feels much better and wants to go home today.  He will be discharged home today on oral Lasix -Outpatient follow-up with PCP and cardiology   Acute metabolic acidosis -Resolved   UTI: Present on admission -Urine cultures growing E. coli: Rocephin switched to oral cefadroxil.  Will need 4 more days of cefadroxil on discharge.   Permanent A-fib with RVR -Recently was put on metoprolol and Cardizem by Dr. Jacinto Halim.  Continue Cardizem and increased dose of metoprolol.  Outpatient follow up with cardiology. -Resume Eliquis   History of CAD status post CABG -No reported chest pain.  Troponins normal   History of aortic valve replacement with bioprosthetic valve and subsequently valve in valve TAVR -Echo showed normal function of prosthetic aortic valve.  Hypokalemia -Replace.  Continue replacement as an outpatient.  Follow BMP as an outpatient.   Asymptomatic ascending aortic dilation -Measuring 4.4 cm as of May 2024.  Outpatient follow-up   Discharge Instructions   Allergies as of 07/02/2023   No Known Allergies      Medication List     TAKE these medications    amoxicillin 500 MG tablet Commonly known as:  AMOXIL Take 4 tablets (2,000 mg total) by mouth as directed. Take 1 hour prior to dental work, including cleanings. What changed:  when to take this reasons to take this   aspirin EC 81 MG tablet Take 81 mg by  mouth daily. Swallow whole.   atorvastatin 20 MG tablet Commonly known as: LIPITOR Take 40 mg by mouth every evening.   cefadroxil 500 MG capsule Commonly known as: DURICEF Take 1 capsule (500 mg total) by mouth 2 (two) times daily for 4 days.   diltiazem 30 MG tablet Commonly known as: Cardizem Take 1 tablet (30 mg total) by mouth 3 (three) times daily.   Eliquis 5 MG Tabs tablet Generic drug: apixaban TAKE 1 TABLET BY MOUTH TWICE  DAILY   ezetimibe 10 MG tablet Commonly known as: ZETIA TAKE 1 TABLET BY MOUTH IN THE  EVENING   furosemide 20 MG tablet Commonly known as: LASIX Take 1 tablet (20 mg total) by mouth daily. What changed: See the new instructions.   metoprolol tartrate 50 MG tablet Commonly known as: LOPRESSOR Take 1 tablet (50 mg total) by mouth 2 (two) times daily. What changed:  medication strength how much to take   nitroGLYCERIN 0.4 MG SL tablet Commonly known as: NITROSTAT Place 1 tablet (0.4 mg total) under the tongue every 5 (five) minutes as needed. What changed: reasons to take this   OcuSoft Eyelid Cleansing Pads Place 1 application into the left eye in the morning.   potassium chloride SA 20 MEQ tablet Commonly known as: KLOR-CON M Take 2 tablets (40 mEq total) by mouth daily.        Follow-up Information     Eric Beams, MD. Schedule an appointment as soon as possible for a visit in 1 week(s).   Specialty: Family Medicine Contact information: [phone] [address] [place] [address] [phone]                No Known Allergies  Consultations: Cardiology   Procedures/Studies: ECHOCARDIOGRAM COMPLETE Result Date: 07/01/2023    ECHOCARDIOGRAM REPORT   Patient Name:   Eric [name] Date of Exam: 07/01/2023 Medical Rec #:  [id number]       Height:       72.0 in Accession #:    [id number]      Weight:       183.6 lb Date of Birth:  [birth year]       BSA:          2.055 m Patient Age:    89 years        BP:            130/88 mmHg Patient Gender: M               HR:           111 bpm. Exam Location:  Inpatient Procedure: 2D Echo, Cardiac Doppler and Color Doppler Indications:    Congestive Heart Failure I50.9  History:        Patient has prior history of Echocardiogram examinations, most                 recent 11/25/2020. CHF, Aortic Valve Disease and Mitral Valve                 Disease; Arrythmias:Atrial Fibrillation.                 Aortic Valve: 23 mm Sapien prosthetic, stented (TAVR) valve is  present in the aortic position.                 Mitral Valve: prosthetic annuloplasty ring valve is present in                 the mitral position.  Sonographer:    Webb Laws Referring Phys: 5 SAMUEL G MCDOWELL IMPRESSIONS  1. Left ventricular ejection fraction, by estimation, is 70 to 75%. The left ventricle has hyperdynamic function. The left ventricle has no regional wall motion abnormalities. There is mild left ventricular hypertrophy. Left ventricular diastolic function could not be evaluated.  2. Right ventricular systolic function is normal. The right ventricular size is mildly enlarged.  3. Left atrial size was mildly dilated.  4. Right atrial size was moderately dilated.  5. The mitral valve has been repaired/replaced. Trivial mitral valve regurgitation. Mild mitral stenosis. 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  6. The aortic valve has been repaired/replaced. Aortic valve regurgitation is not visualized. No aortic stenosis is present. There is a 23 mm Sapien prosthetic (TAVR) valve present in the aortic position. Echo findings are consistent with normal structure and function of the aortic valve prosthesis.  7. Aortic dilatation noted. There is mild dilatation of the ascending aorta, measuring 41 mm.  8. The inferior vena cava is normal in size with greater than 50% respiratory variability, suggesting right atrial pressure of 3 mmHg. Comparison(s): No significant  change from prior study. FINDINGS  Left Ventricle: Left ventricular ejection fraction, by estimation, is 70 to 75%. The left ventricle has hyperdynamic function. The left ventricle has no regional wall motion abnormalities. The left ventricular internal cavity size was normal in size. There is mild left ventricular hypertrophy. Left ventricular diastolic function could not be evaluated due to atrial fibrillation. Left ventricular diastolic function could not be evaluated. Right Ventricle: The right ventricular size is mildly enlarged. Right ventricular systolic function is normal. Left Atrium: Left atrial size was mildly dilated. Right Atrium: Right atrial size was moderately dilated. Pericardium: There is no evidence of pericardial effusion. Mitral Valve: The mitral valve has been repaired/replaced. Trivial mitral valve regurgitation. 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 Mild mitral valve stenosis. MV peak gradient, 13.7 mmHg. The mean mitral valve gradient  is 6.0 mmHg. Tricuspid Valve: The tricuspid valve is normal in structure. Tricuspid valve regurgitation is mild . No evidence of tricuspid stenosis. Aortic Valve: The aortic valve has been repaired/replaced. Aortic valve regurgitation is not visualized. No aortic stenosis is present. Aortic valve mean gradient measures 10.3 mmHg. Aortic valve peak gradient measures 19.5 mmHg. Aortic valve area, by VTI measures 1.28 cm. There is a 23 mm Sapien prosthetic, stented (TAVR) valve present in the aortic position. Echo findings are consistent with normal structure and function of the aortic valve prosthesis. Pulmonic Valve: The pulmonic valve was normal in structure. Pulmonic valve regurgitation is not visualized. No evidence of pulmonic stenosis. Aorta: Aortic dilatation noted. There is mild dilatation of the ascending aorta, measuring 41 mm. Venous: The inferior vena cava is normal in size with greater than 50% respiratory  variability, suggesting right atrial pressure of 3 mmHg. IAS/Shunts: No atrial level shunt detected by color flow Doppler.  LEFT VENTRICLE PLAX 2D LVIDd:         4.60 cm   Diastology LVIDs:         2.50 cm   LV e' medial:    9.95 cm/s LV PW:  1.20 cm   LV E/e' medial:  19.3 LV IVS:        1.00 cm   LV e' lateral:   8.24 cm/s LVOT diam:     1.90 cm   LV E/e' lateral: 23.3 LV SV:         47 LV SV Index:   23 LVOT Area:     2.84 cm  RIGHT VENTRICLE            IVC RV Basal diam:  4.40 cm    IVC diam: 1.90 cm RV Mid diam:    2.90 cm RV S prime:     9.64 cm/s TAPSE (M-mode): 1.6 cm LEFT ATRIUM             Index        RIGHT ATRIUM           Index LA diam:        5.00 cm 2.43 cm/m   RA Area:     26.10 cm LA Vol (A2C):   54.2 ml 26.38 ml/m  RA Volume:   81.60 ml  39.71 ml/m LA Vol (A4C):   65.7 ml 31.98 ml/m LA Biplane Vol: 60.8 ml 29.59 ml/m  AORTIC VALVE AV Area (Vmax):    1.16 cm AV Area (Vmean):   1.16 cm AV Area (VTI):     1.28 cm AV Vmax:           221.00 cm/s AV Vmean:          143.667 cm/s AV VTI:            0.371 m AV Peak Grad:      19.5 mmHg AV Mean Grad:      10.3 mmHg LVOT Vmax:         90.64 cm/s LVOT Vmean:        58.800 cm/s LVOT VTI:          0.167 m LVOT/AV VTI ratio: 0.45  AORTA Ao Root diam: 2.30 cm Ao Asc diam:  4.10 cm MITRAL VALVE                TRICUSPID VALVE MV Area (PHT): 2.24 cm     TR Peak grad:   36.5 mmHg MV Area VTI:   1.16 cm     TR Vmax:        302.00 cm/s MV Peak grad:  13.7 mmHg MV Mean grad:  6.0 mmHg     SHUNTS MV Vmax:       1.85 m/s     Systemic VTI:  0.17 m MV Vmean:      111.0 cm/s   Systemic Diam: 1.90 cm MV Decel Time: 338 msec MV E velocity: 192.00 cm/s Olga Millers MD Electronically signed by Olga Millers MD Signature Date/Time: 07/01/2023/12:15:23 PM    Final    CT Angio Chest PE W and/or Wo Contrast Result Date: 06/29/2023 CLINICAL DATA:  Shortness of breath. Hypoxia. High probability for pulmonary embolism. EXAM: CT ANGIOGRAPHY CHEST WITH CONTRAST  TECHNIQUE: Multidetector CT imaging of the chest was performed using the standard protocol during bolus administration of intravenous contrast. Multiplanar CT image reconstructions and MIPs were obtained to evaluate the vascular anatomy. RADIATION DOSE REDUCTION: This exam was performed according to the departmental dose-optimization program which includes automated exposure control, adjustment of the mA and/or kV according to patient size and/or use of iterative reconstruction technique. CONTRAST:  75mL OMNIPAQUE IOHEXOL 350 MG/ML SOLN COMPARISON:  10/15/2022 FINDINGS: Cardiovascular: Satisfactory  opacification of pulmonary arteries noted, and no pulmonary emboli identified. Mediastinum/Nodes: No pathologically enlarged lymph nodes identified. 2.6 cm low-attenuation right thyroid lobe nodule shows no significant change. Lungs/Pleura: New moderate to large pleural effusions are seen bilaterally. Compressive atelectasis seen mainly in the lower lobes. Diffuse heterogeneous ground-glass opacity throughout both lungs, likely due to pulmonary edema. Upper abdomen: No acute findings. Musculoskeletal: No suspicious bone lesions identified. Review of the MIP images confirms the above findings. IMPRESSION: No evidence of pulmonary embolism. New moderate to large bilateral pleural effusions and compressive atelectasis. Diffuse ground-glass pulmonary opacity, likely due to pulmonary edema. Stable 2.6 cm right thyroid lobe nodule. Recommend thyroid US if this has not been previously evaluated. (Ref: J Am Coll Radiol. 2015 Feb;12(2): 143-50). Electronically Signed   By: Danae Orleans M.D.   On: 06/29/2023 13:43   DG Chest Portable 1 View Result Date: 06/29/2023 CLINICAL DATA:  89 year old male with shortness of breath and fever. Hypoxia. EXAM: PORTABLE CHEST 1 VIEW COMPARISON:  CTA chest 10/15/2022 and earlier. FINDINGS: Portable AP semi upright view at 0857 hours. Chronic sternotomy, TAVR. Lower lung volumes. Stable cardiac  size and mediastinal contours. Visualized tracheal air column is within normal limits. Bilateral basilar predominant increased pulmonary interstitium, indistinct vasculature. Evidence of new small right pleural effusion, chronic blunting of the left costophrenic angle. No pneumothorax or consolidation. No acute osseous abnormality identified. Paucity of bowel gas. IMPRESSION: Lower lung volumes with evidence of new small right pleural effusion and increased interstitial opacity which more resembles acute pulmonary edema, but viral/atypical respiratory infection not excluded in the setting of fever. Electronically Signed   By: Odessa Fleming M.D.   On: 06/29/2023 09:27      Subjective: Patient seen and examined at bedside.  Feels better and wants to go home today.  Denies any chest pain, fever, vomiting.  Discharge Exam: Vitals:   07/02/23 0347 07/02/23 0826  BP: 116/82 137/82  Pulse: 87 (!) 103  Resp: 18 16  Temp: 99 F (37.2 C) 98.7 F (37.1 C)  SpO2: 97% 95%    General: Pt is alert, awake, not in acute distress.  On room air.  Elderly male lying in bed. Cardiovascular: Mild intermittent tachycardia present, S1/S2 + Respiratory: bilateral decreased breath sounds at bases with some scattered crackles Abdominal: Soft, NT, ND, bowel sounds + Extremities: Trace lower extremity edema; no cyanosis    The results of significant diagnostics from this hospitalization (including imaging, microbiology, ancillary and laboratory) are listed below for reference.     Microbiology: Recent Results (from the past 240 hours)  Resp panel by RT-PCR (RSV, Flu A&B, Covid) Anterior Nasal Swab     Status: None   Collection Time: 06/29/23  8:49 AM   Specimen: Anterior Nasal Swab  Result Value Ref Range Status   SARS Coronavirus 2 by RT PCR NEGATIVE NEGATIVE Final   Influenza A by PCR NEGATIVE NEGATIVE Final   Influenza B by PCR NEGATIVE NEGATIVE Final    Comment: (NOTE) The Xpert Xpress SARS-CoV-2/FLU/RSV  plus assay is intended as an aid in the diagnosis of influenza from Nasopharyngeal swab specimens and should not be used as a sole basis for treatment. Nasal washings and aspirates are unacceptable for Xpert Xpress SARS-CoV-2/FLU/RSV testing.  Fact Sheet for Patients: BloggerCourse.com  Fact Sheet for Healthcare Providers: SeriousBroker.it  This test is not yet approved or cleared by the [place] FDA and has been authorized for detection and/or diagnosis of SARS-CoV-2 by FDA under an Emergency Use Authorization (  EUA). This EUA will remain in effect (meaning this test can be used) for the duration of the COVID-19 declaration under Section 564(b)(1) of the Act, 21 U.S.C. section 360bbb-3(b)(1), unless the authorization is terminated or revoked.     Resp Syncytial Virus by PCR NEGATIVE NEGATIVE Final    Comment: (NOTE) Fact Sheet for Patients: BloggerCourse.com  Fact Sheet for Healthcare Providers: SeriousBroker.it  This test is not yet approved or cleared by the [place] FDA and has been authorized for detection and/or diagnosis of SARS-CoV-2 by FDA under an Emergency Use Authorization (EUA). This EUA will remain in effect (meaning this test can be used) for the duration of the COVID-19 declaration under Section 564(b)(1) of the Act, 21 U.S.C. section 360bbb-3(b)(1), unless the authorization is terminated or revoked.  Performed at [name], [address] [place], [place] [address]   Blood culture (routine x 2)     Status: None (Preliminary result)   Collection Time: 06/29/23  8:53 AM   Specimen: BLOOD LEFT FOREARM  Result Value Ref Range Status   Specimen Description BLOOD LEFT FOREARM  Final   Special Requests   Final    BOTTLES DRAWN AEROBIC AND ANAEROBIC Blood Culture adequate volume   Culture   Final    NO GROWTH 3 DAYS Performed at [facility], [address] [place], [place] [address]    Report Status PENDING  Incomplete  Blood culture (routine x 2)     Status: None (Preliminary result)   Collection Time: 06/29/23  8:58 AM   Specimen: BLOOD RIGHT FOREARM  Result Value Ref Range Status   Specimen Description BLOOD RIGHT FOREARM  Final   Special Requests   Final    BOTTLES DRAWN AEROBIC AND ANAEROBIC Blood Culture adequate volume   Culture   Final    NO GROWTH 3 DAYS Performed at [facility], [address], [place], [place] [address]    Report Status PENDING  Incomplete  Urine Culture     Status: Abnormal   Collection Time: 06/29/23  9:48 AM   Specimen: Urine, Clean Catch  Result Value Ref Range Status   Specimen Description URINE, CLEAN CATCH  Final   Special Requests   Final    NONE Performed at [facility], [address], [place], [place] [address]    Culture >=100,000 COLONIES/mL ESCHERICHIA COLI (A)  Final   Report Status 07/01/2023 FINAL  Final   Organism ID, Bacteria ESCHERICHIA COLI (A)  Final      Susceptibility   Escherichia coli - MIC*    AMPICILLIN >=32 RESISTANT Resistant     CEFAZOLIN <=4 SENSITIVE Sensitive     CEFEPIME <=0.12 SENSITIVE Sensitive     CEFTRIAXONE <=0.25 SENSITIVE Sensitive     CIPROFLOXACIN <=0.25 SENSITIVE Sensitive     GENTAMICIN <=1 SENSITIVE Sensitive     IMIPENEM <=0.25 SENSITIVE Sensitive     NITROFURANTOIN <=16 SENSITIVE Sensitive     TRIMETH/SULFA >=320 RESISTANT Resistant     AMPICILLIN/SULBACTAM 8 SENSITIVE Sensitive     PIP/TAZO <=4 SENSITIVE Sensitive ug/mL    * >=100,000 COLONIES/mL ESCHERICHIA COLI     Labs: BNP (last 3 results) Recent Labs    06/29/23 0848  BNP 225.9*   Basic Metabolic Panel: Recent Labs  Lab 06/29/23 0848 06/29/23 1734 06/30/23 0626 06/30/23 1611 07/01/23 0640 07/02/23 0616  NA 137  --  136 137 137 135  K 4.0  --  3.5 3.4* 3.3*  3.3*  CL 108  --  103 100 98 96*  CO2 18*  --  23 25 28 28   GLUCOSE 122*  --   104* 102* 109* 101*  BUN 16  --  18 23 18 23   CREATININE 0.95 1.05 1.16 1.30* 1.12 1.05  CALCIUM 9.3  --  8.6* 9.1 8.8* 8.7*  MG 2.0  --  1.8  --  1.9 2.3   Liver Function Tests: Recent Labs  Lab 06/30/23 0626 07/01/23 0640  AST 33 28  ALT 29 29  ALKPHOS 45 43  BILITOT 5.1* 4.6*  PROT 6.4* 6.4*  ALBUMIN 3.5 3.5   No results for input(s): "LIPASE", "AMYLASE" in the last 168 hours. No results for input(s): "AMMONIA" in the last 168 hours. CBC: Recent Labs  Lab 06/29/23 0848 06/29/23 1734 06/30/23 0626 07/01/23 0640  WBC 8.5 7.3 6.5 6.9  NEUTROABS  --   --   --  4.5  HGB 14.9 14.1 13.8 13.9  HCT 44.4 42.4 40.7 41.6  MCV 94.3 94.4 93.1 93.3  PLT 201 172 173 176   Cardiac Enzymes: No results for input(s): "CKTOTAL", "CKMB", "CKMBINDEX", "TROPONINI" in the last 168 hours. BNP: Invalid input(s): "POCBNP" CBG: No results for input(s): "GLUCAP" in the last 168 hours. D-Dimer No results for input(s): "DDIMER" in the last 72 hours. Hgb A1c No results for input(s): "HGBA1C" in the last 72 hours. Lipid Profile No results for input(s): "CHOL", "HDL", "LDLCALC", "TRIG", "CHOLHDL", "LDLDIRECT" in the last 72 hours. Thyroid function studies No results for input(s): "TSH", "T4TOTAL", "T3FREE", "THYROIDAB" in the last 72 hours.  Invalid input(s): "FREET3" Anemia work up No results for input(s): "VITAMINB12", "FOLATE", "FERRITIN", "TIBC", "IRON", "RETICCTPCT" in the last 72 hours. Urinalysis    Component Value Date/Time   COLORURINE YELLOW 06/29/2023 0907   APPEARANCEUR HAZY (A) 06/29/2023 0907   LABSPEC 1.015 06/29/2023 0907   PHURINE 5.0 06/29/2023 0907   GLUCOSEU NEGATIVE 06/29/2023 0907   HGBUR NEGATIVE 06/29/2023 0907   BILIRUBINUR NEGATIVE 06/29/2023 0907   KETONESUR NEGATIVE 06/29/2023 0907   PROTEINUR NEGATIVE 06/29/2023 0907   UROBILINOGEN 1.0 06/17/2007 1709   NITRITE POSITIVE (A) 06/29/2023 0907   LEUKOCYTESUR NEGATIVE 06/29/2023 0907   Sepsis Labs Recent  Labs  Lab 06/29/23 0848 06/29/23 1734 06/30/23 0626 07/01/23 0640  WBC 8.5 7.3 6.5 6.9   Microbiology Recent Results (from the past 240 hours)  Resp panel by RT-PCR (RSV, Flu A&B, Covid) Anterior Nasal Swab     Status: None   Collection Time: 06/29/23  8:49 AM   Specimen: Anterior Nasal Swab  Result Value Ref Range Status   SARS Coronavirus 2 by RT PCR NEGATIVE NEGATIVE Final   Influenza A by PCR NEGATIVE NEGATIVE Final   Influenza B by PCR NEGATIVE NEGATIVE Final    Comment: (NOTE) The Xpert Xpress SARS-CoV-2/FLU/RSV plus assay is intended as an aid in the diagnosis of influenza from Nasopharyngeal swab specimens and should not be used as a sole basis for treatment. Nasal washings and aspirates are unacceptable for Xpert Xpress SARS-CoV-2/FLU/RSV testing.  Fact Sheet for Patients: BloggerCourse.com  Fact Sheet for Healthcare Providers: SeriousBroker.it  This test is not yet approved or cleared by the [place] FDA and has been authorized for detection and/or diagnosis of SARS-CoV-2 by FDA under an Emergency Use Authorization (EUA). This EUA will remain in effect (meaning this test can be used) for the duration of the COVID-19 declaration under Section 564(b)(1) of the Act, 21 U.S.C. section 360bbb-3(b)(1), unless  the authorization is terminated or revoked.     Resp Syncytial Virus by PCR NEGATIVE NEGATIVE Final    Comment: (NOTE) Fact Sheet for Patients: BloggerCourse.com  Fact Sheet for Healthcare Providers: SeriousBroker.it  This test is not yet approved or cleared by the [place] FDA and has been authorized for detection and/or diagnosis of SARS-CoV-2 by FDA under an Emergency Use Authorization (EUA). This EUA will remain in effect (meaning this test can be used) for the duration of the COVID-19 declaration under Section 564(b)(1) of the Act, 21  U.S.C. section 360bbb-3(b)(1), unless the authorization is terminated or revoked.  Performed at [facility], [address], [place], [place] [address]   Blood culture (routine x 2)     Status: None (Preliminary result)   Collection Time: 06/29/23  8:53 AM   Specimen: BLOOD LEFT FOREARM  Result Value Ref Range Status   Specimen Description BLOOD LEFT FOREARM  Final   Special Requests   Final    BOTTLES DRAWN AEROBIC AND ANAEROBIC Blood Culture adequate volume   Culture   Final    NO GROWTH 3 DAYS Performed at [facility], [address] [place], [place] [address]    Report Status PENDING  Incomplete  Blood culture (routine x 2)     Status: None (Preliminary result)   Collection Time: 06/29/23  8:58 AM   Specimen: BLOOD RIGHT FOREARM  Result Value Ref Range Status   Specimen Description BLOOD RIGHT FOREARM  Final   Special Requests   Final    BOTTLES DRAWN AEROBIC AND ANAEROBIC Blood Culture adequate volume   Culture   Final    NO GROWTH 3 DAYS Performed at [facility], [address] [place], [place] [address]    Report Status PENDING  Incomplete  Urine Culture     Status: Abnormal   Collection Time: 06/29/23  9:48 AM   Specimen: Urine, Clean Catch  Result Value Ref Range Status   Specimen Description URINE, CLEAN CATCH  Final   Special Requests   Final    NONE Performed at [facility], [address], [place], [place] [address]    Culture >=100,000 COLONIES/mL ESCHERICHIA COLI (A)  Final   Report Status 07/01/2023 FINAL  Final   Organism ID, Bacteria ESCHERICHIA COLI (A)  Final      Susceptibility   Escherichia coli - MIC*    AMPICILLIN >=32 RESISTANT Resistant     CEFAZOLIN <=4 SENSITIVE Sensitive     CEFEPIME <=0.12 SENSITIVE Sensitive     CEFTRIAXONE <=0.25 SENSITIVE Sensitive     CIPROFLOXACIN <=0.25 SENSITIVE Sensitive     GENTAMICIN <=1 SENSITIVE Sensitive     IMIPENEM <=0.25 SENSITIVE Sensitive     NITROFURANTOIN <=16 SENSITIVE  Sensitive     TRIMETH/SULFA >=320 RESISTANT Resistant     AMPICILLIN/SULBACTAM 8 SENSITIVE Sensitive     PIP/TAZO <=4 SENSITIVE Sensitive ug/mL    * >=100,000 COLONIES/mL ESCHERICHIA COLI     Time coordinating discharge: 35 minutes  SIGNED:   Glade Lloyd, MD  [facility] 07/02/2023, 11:05 AM

## 2023-07-03 ENCOUNTER — Ambulatory Visit (HOSPITAL_COMMUNITY)
Admission: RE | Admit: 2023-07-03 | Discharge: 2023-07-03 | Disposition: A | Discharge status: Home / Self Care | Payer: Medicare Other | Source: Ambulatory Visit | Attending: Internal Medicine | Admitting: Internal Medicine

## 2023-07-03 VITALS — BP 130/64 | HR 63 | Ht 72.0 in | Wt 185.8 lb

## 2023-07-03 DIAGNOSIS — Z951 Presence of aortocoronary bypass graft: Secondary | ICD-10-CM | POA: Diagnosis not present

## 2023-07-03 DIAGNOSIS — I4821 Permanent atrial fibrillation: Secondary | ICD-10-CM | POA: Diagnosis not present

## 2023-07-03 DIAGNOSIS — I2510 Atherosclerotic heart disease of native coronary artery without angina pectoris: Secondary | ICD-10-CM | POA: Diagnosis not present

## 2023-07-03 DIAGNOSIS — Z7901 Long term (current) use of anticoagulants: Secondary | ICD-10-CM | POA: Insufficient documentation

## 2023-07-03 DIAGNOSIS — I5030 Unspecified diastolic (congestive) heart failure: Secondary | ICD-10-CM | POA: Diagnosis not present

## 2023-07-03 DIAGNOSIS — Z79899 Other long term (current) drug therapy: Secondary | ICD-10-CM | POA: Diagnosis not present

## 2023-07-03 DIAGNOSIS — D6869 Other thrombophilia: Secondary | ICD-10-CM | POA: Insufficient documentation

## 2023-07-03 DIAGNOSIS — I4811 Longstanding persistent atrial fibrillation: Secondary | ICD-10-CM | POA: Diagnosis present

## 2023-07-03 NOTE — Progress Notes (Addendum)
Primary Care Physician: Aliene Beams, MD Primary Cardiologist: Yates Decamp, MD Electrophysiologist: None     Referring Physician: Dr. Rica Koyanagi is a 89 y.o. male with a history of AVR, HFpEF, CAD s/p CABG, CVA, DVT/PE, ascending aortic aneurysm, history of prostate cancer, and permanent atrial fibrillation who presents for consultation in the [facility]. Recent hospital admission 2/8-11/25 for worsening SOB secondary to taking lasix inconsistently found to be hypoxic in the 80s also noted to be in Afib with RVR. Discharged on antibiotic for UTI, oral lasix 20 mg daily, cardizem 30 mg TID, and Lopressor 50 mg BID. Review of Dr. Verl Dicker last OV shows permanent Afib since 2020. Patient is on Eliquis 5 mg BID for a CHADS2VASC score of 6.  On evaluation today, he is currently in Afib - HR 63. He feels better today. Wife has him taking medications in her presence just to get the repetition correct. He does admit that the likely hospital admission was because he was not taking lasix regularly. He notes it would make him urinate very frequently and on days he had to leave the house he would not take it.   Today, he denies symptoms of palpitations, chest pain, shortness of breath, orthopnea, PND, lower extremity edema, dizziness, presyncope, syncope, snoring, daytime somnolence, bleeding, or neurologic sequela. The patient is tolerating medications without difficulties and is otherwise without complaint today.   he has a BMI of Body mass index is 25.2 kg/m.Marland Kitchen Filed Weights   07/03/23 1317  Weight: 84.3 kg    Current Outpatient Medications  Medication Sig Dispense Refill   amoxicillin (AMOXIL) 500 MG tablet Take 4 tablets (2,000 mg total) by mouth as directed. Take 1 hour prior to dental work, including cleanings. (Patient taking differently: Take 2,000 mg by mouth as needed (for dental procedures). Take 1 hour prior to dental work, including cleanings.)  4 tablet 6   aspirin EC 81 MG tablet Take 81 mg by mouth daily. Swallow whole.     atorvastatin (LIPITOR) 20 MG tablet Take 40 mg by mouth every evening.     cefadroxil (DURICEF) 500 MG capsule Take 1 capsule (500 mg total) by mouth 2 (two) times daily for 4 days. 8 capsule 0   diltiazem (CARDIZEM) 30 MG tablet Take 1 tablet (30 mg total) by mouth 3 (three) times daily. 90 tablet 2   ELIQUIS 5 MG TABS tablet TAKE 1 TABLET BY MOUTH TWICE  DAILY 180 tablet 3   Eyelid Cleansers (OCUSOFT EYELID CLEANSING) PADS Place 1 application into the left eye in the morning.     ezetimibe (ZETIA) 10 MG tablet TAKE 1 TABLET BY MOUTH IN THE  EVENING 90 tablet 3   furosemide (LASIX) 20 MG tablet Take 1 tablet (20 mg total) by mouth daily.     metoprolol tartrate (LOPRESSOR) 50 MG tablet Take 1 tablet (50 mg total) by mouth 2 (two) times daily. 60 tablet 0   nitroGLYCERIN (NITROSTAT) 0.4 MG SL tablet Place 1 tablet (0.4 mg total) under the tongue every 5 (five) minutes as needed. (Patient taking differently: Place 0.4 mg under the tongue every 5 (five) minutes as needed for chest pain.) 25 tablet 3   potassium chloride SA (KLOR-CON M) 20 MEQ tablet Take 2 tablets (40 mEq total) by mouth daily. (Patient taking differently: Take 20 mEq by mouth 2 (two) times daily.) 30 tablet 0   No current facility-administered medications for this encounter.  Atrial Fibrillation Management history:  Previous antiarrhythmic drugs: none Previous cardioversions: none Previous ablations: none Anticoagulation history: Eliquis   ROS- All systems are reviewed and negative except as per the HPI above.  Physical Exam: BP 130/64   Pulse 63   Ht 6' (1.829 m)   Wt 84.3 kg   BMI 25.20 kg/m   GEN: Well nourished, well developed in no acute distress NECK: No JVD; No carotid bruits CARDIAC: Regular rate and rhythm, no murmurs, rubs, gallops RESPIRATORY:  Clear to auscultation without rales, wheezing or rhonchi  ABDOMEN: Soft,  non-tender, non-distended EXTREMITIES:  No edema; No deformity   EKG today demonstrates  Vent. rate 63 BPM PR interval * ms QRS duration 84 ms QT/QTcB 398/407 ms P-R-T axes * 25 42 Atrial fibrillation with premature ventricular or aberrantly conducted complexes Abnormal ECG When compared with ECG of 29-Jun-2023 08:50, PREVIOUS ECG IS PRESENT  Echo 07/01/23 demonstrated   1. Left ventricular ejection fraction, by estimation, is 70 to 75%. The  left ventricle has hyperdynamic function. The left ventricle has no  regional wall motion abnormalities. There is mild left ventricular  hypertrophy. Left ventricular diastolic  function could not be evaluated.   2. Right ventricular systolic function is normal. The right ventricular  size is mildly enlarged.   3. Left atrial size was mildly dilated.   4. Right atrial size was moderately dilated.   5. The mitral valve has been repaired/replaced. Trivial mitral valve  regurgitation. Mild mitral stenosis. There is a prosthetic annuloplasty  ring present in the mitral position.   6. The aortic valve has been repaired/replaced. Aortic valve  regurgitation is not visualized. No aortic stenosis is present. There is a  23 mm Sapien prosthetic (TAVR) valve present in the aortic position. Echo  findings are consistent with normal  structure and function of the aortic valve prosthesis.   7. Aortic dilatation noted. There is mild dilatation of the ascending  aorta, measuring 41 mm.   8. The inferior vena cava is normal in size with greater than 50%  respiratory variability, suggesting right atrial pressure of 3 mmHg.    ASSESSMENT & PLAN CHA2DS2-VASc Score = 6  The patient's score is based upon: CHF History: 0 HTN History: 0 Diabetes History: 0 Stroke History: 2 Vascular Disease History: 1 Age Score: 2 Gender Score: 0       ASSESSMENT AND PLAN: Permanent Atrial Fibrillation (ICD10:  I48.11) The patient's CHA2DS2-VASc score is 6,  indicating a 9.7% annual risk of stroke.    He is currently in Afib. He is doing well overall and being compliant with medication as directed from hospital discharge. He has a HR 63 today in office which is great rate control. Continue current regimen of metoprolol 50 mg BID and diltiazem 30 mg TID.   He is able to resume physical therapy next week.   Secondary Hypercoagulable State (ICD10:  D68.69) The patient is at significant risk for stroke/thromboembolism based upon his CHA2DS2-VASc Score of 6.  Continue Apixaban (Eliquis).  Continue without interruption.     Follow up Afib clinic prn.    Lake Bells, PA-C  [facility] [address] [place], [place] [address] [phone]

## 2023-07-04 LAB — CULTURE, BLOOD (ROUTINE X 2)
Culture: NO GROWTH
Culture: NO GROWTH
Special Requests: ADEQUATE
Special Requests: ADEQUATE

## 2023-07-16 LAB — COMPREHENSIVE METABOLIC PANEL WITH GFR: EGFR: 70

## 2023-07-24 ENCOUNTER — Ambulatory Visit: Payer: Medicare Other | Attending: Cardiology | Admitting: Cardiology

## 2023-07-24 ENCOUNTER — Encounter: Payer: Self-pay | Admitting: Cardiology

## 2023-07-24 VITALS — BP 122/68 | HR 55 | Resp 16 | Ht 72.0 in | Wt 189.0 lb

## 2023-07-24 DIAGNOSIS — I5033 Acute on chronic diastolic (congestive) heart failure: Secondary | ICD-10-CM

## 2023-07-24 DIAGNOSIS — I4821 Permanent atrial fibrillation: Secondary | ICD-10-CM | POA: Diagnosis not present

## 2023-07-24 DIAGNOSIS — Z952 Presence of prosthetic heart valve: Secondary | ICD-10-CM | POA: Diagnosis not present

## 2023-07-24 DIAGNOSIS — I10 Essential (primary) hypertension: Secondary | ICD-10-CM

## 2023-07-24 DIAGNOSIS — Z9889 Other specified postprocedural states: Secondary | ICD-10-CM

## 2023-07-24 MED ORDER — FUROSEMIDE 20 MG PO TABS: 20.0000 mg | ORAL_TABLET | Freq: Every day | ORAL | Status: DC

## 2023-07-24 MED ORDER — SPIRONOLACTONE 25 MG PO TABS: 25.0000 mg | ORAL_TABLET | ORAL | 2 refills | Status: DC

## 2023-07-24 NOTE — Progress Notes (Signed)
 Cardiology Office Note:  .   Date:  07/24/2023  ID:  Eric [name], DOB 1930-01-31, MRN [id number] PCP: Aliene Beams, MD  [facility] Providers Cardiologist:  Yates Decamp, MD   History of Present Illness: .   Eric [name] is a 89 y.o.  Caucasian male patient who is a nonagenarian with bioprosthetic aortic valve replacement along with MV repair and single SVG to OM1 CABG in 2009.  Presented with atypical atrial flutter SP cardioversion in 2015 and again in 2019 when he presented with heart failure and A-fib in [place] along with severe AI.  Underwent successful TAVR with placement of a 23 mm Edwards SAPIEN 3 THV on 11/25/2018.  CT scan 10/15/2022 revealing 4.5 cm ascending aortic fusiform aneurysm, persistent atrial fibrillation and remains asymptomatic.   Patient was seen by me in June 24, 2023 with A-fib with RVR, however presented a week later on 07/02/2023 with acute decompensated heart failure and A-fib with RVR to the hospital, had a 3-day hospital stay needing IV diuretics.  He also had UTI.  Metoprolol dose was increased and eventually discharged for outpatient follow-up.  He now presents for posthospital follow-up.  Discussed the use of AI scribe software for clinical note transcription with the patient, who gave verbal consent to proceed.  History of Present Illness   The patient, with a history of heart valve replacement, presents for follow-up after a recent hospitalization for shortness of breath and fluid accumulation. The patient's spouse reports that the patient's shortness of breath worsened, leading to hospital admission where six liters of fluid were removed, resulting in a ten-pound weight loss. Despite this, the patient continues to experience leg swelling and frequent urination, particularly after taking Lasix. The patient's spouse has been monitoring the patient's weight and blood pressure at home, which have remained stable. The patient reports feeling better  overall, but acknowledges that he is not back to his baseline health status. The patient's spouse also mentions concerns about potential damage to the patient's heart muscle due to the recent fluid overload.     Labs   Lab Results  Component Value Date   CHOL 127 11/25/2020   HDL 50 11/25/2020   LDLCALC 66 11/25/2020   TRIG 57 11/25/2020   CHOLHDL 2.5 11/25/2020   Lab Results  Component Value Date   NA 135 07/02/2023   K 3.3 (L) 07/02/2023   CO2 28 07/02/2023   GLUCOSE 101 (H) 07/02/2023   BUN 23 07/02/2023   CREATININE 1.05 07/02/2023   CALCIUM 8.7 (L) 07/02/2023   GFR 92.73 12/07/2014   EGFR 68 06/24/2023   GFRNONAA >60 07/02/2023      Latest Ref Rng & Units 07/02/2023    6:16 AM 07/01/2023    6:40 AM 06/30/2023    4:11 PM  BMP  Glucose 70 - 99 mg/dL 409  811  914   BUN 8 - 23 mg/dL 23  18  23    Creatinine 0.61 - 1.24 mg/dL 7.82  9.56  2.13   Sodium 135 - 145 mmol/L 135  137  137   Potassium 3.5 - 5.1 mmol/L 3.3  3.3  3.4   Chloride 98 - 111 mmol/L 96  98  100   CO2 22 - 32 mmol/L 28  28  25    Calcium 8.9 - 10.3 mg/dL 8.7  8.8  9.1       Latest Ref Rng & Units 07/01/2023    6:40 AM 06/30/2023    6:26 AM  06/29/2023    5:34 PM  CBC  WBC 4.0 - 10.5 K/uL 6.9  6.5  7.3   Hemoglobin 13.0 - 17.0 g/dL 16.1  09.6  04.5   Hematocrit 39.0 - 52.0 % 41.6  40.7  42.4   Platelets 150 - 400 K/uL 176  173  172    Lab Results  Component Value Date   HGBA1C 5.8 (H) 11/25/2020    Lab Results  Component Value Date   TSH 3.010 11/25/2019    BNP    Component Value Date/Time   BNP 225.9 (H) 06/29/2023 0848   BNP 135.8 (H) 03/20/2016 1217    Review of Systems  Cardiovascular:  Positive for dyspnea on exertion and leg swelling. Negative for chest pain.   Physical Exam:   VS:  BP 122/68 (BP Location: Left Arm, Patient Position: Sitting, Cuff Size: Normal)   Pulse (!) 55   Resp 16   Ht 6' (1.829 m)   Wt 189 lb (85.7 kg)   SpO2 97%   BMI 25.63 kg/m    Wt Readings from Last 3  Encounters:  07/24/23 189 lb (85.7 kg)  07/03/23 185 lb 12.8 oz (84.3 kg)  07/02/23 182 lb 1.6 oz (82.6 kg)    Physical Exam Neck:     Vascular: No carotid bruit or JVD.  Cardiovascular:     Rate and Rhythm: Normal rate and regular rhythm.     Pulses: Intact distal pulses.     Heart sounds: S1 normal and S2 normal. Murmur heard.     Holosystolic murmur is present with a grade of 3/6 at the apex.     No gallop.  Pulmonary:     Effort: Pulmonary effort is normal.     Breath sounds: Normal breath sounds.  Abdominal:     General: Bowel sounds are normal.     Palpations: Abdomen is soft.  Musculoskeletal:     Right lower leg: Edema (2+ pitting above knee edema) present.     Left lower leg: Edema (2+ pitting above knee edema) present.     ECHOCARDIOGRAM COMPLETE 07/01/2023  1. Left ventricular ejection fraction, by estimation, is 70 to 75%. The left ventricle has hyperdynamic function. The left ventricle has no regional wall motion abnormalities. There is mild left ventricular hypertrophy. Left ventricular diastolic function could not be evaluated. 2. Right ventricular systolic function is normal. The right ventricular size is mildly enlarged. 3. Left atrial size was mildly dilated. 4. Right atrial size was moderately dilated. 5. The mitral valve has been repaired/replaced. Trivial mitral valve regurgitation. Mild mitral stenosis. 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 6. The aortic valve has been repaired/replaced. Aortic valve regurgitation is not visualized. No aortic stenosis is present. There is a 23 mm Sapien prosthetic (TAVR) valve present in the aortic position. Echo findings are consistent with normal structure and function of the aortic valve prosthesis. 7. Aortic dilatation noted. There is mild dilatation of the ascending aorta, measuring 41 mm. 8. The inferior vena cava is normal in size with greater than 50% respiratory variability, suggesting  right atrial pressure of 3 mmHg.  EKG:    EKG Interpretation Date/Time:  Wednesday July 24 2023 11:41:50 EST Ventricular Rate:  58 PR Interval:    QRS Duration:  98 QT Interval:  430 QTC Calculation: 422 R Axis:   18  Text Interpretation: EKG 07/24/2023: Atrial fibrillation with controlled ventricular response at the rate of 58 bpm, low-voltage complexes otherwise normal  EKG.  Compared to 07/03/2023, frequent PVCs not present. Confirmed by Delrae Rend [phone]) on 07/24/2023 12:01:46 PM    Medications and allergies    No Known Allergies   Current Outpatient Medications:    aspirin EC 81 MG tablet, Take 81 mg by mouth daily. Swallow whole., Disp: , Rfl:    atorvastatin (LIPITOR) 20 MG tablet, Take 40 mg by mouth every evening., Disp: , Rfl:    diltiazem (CARDIZEM) 30 MG tablet, Take 1 tablet (30 mg total) by mouth 3 (three) times daily., Disp: 90 tablet, Rfl: 2   ELIQUIS 5 MG TABS tablet, TAKE 1 TABLET BY MOUTH TWICE  DAILY, Disp: 180 tablet, Rfl: 3   Eyelid Cleansers (OCUSOFT EYELID CLEANSING) PADS, Place 1 application into the left eye in the morning., Disp: , Rfl:    ezetimibe (ZETIA) 10 MG tablet, TAKE 1 TABLET BY MOUTH IN THE  EVENING, Disp: 90 tablet, Rfl: 3   metoprolol tartrate (LOPRESSOR) 50 MG tablet, Take 1 tablet (50 mg total) by mouth 2 (two) times daily., Disp: 60 tablet, Rfl: 0   nitroGLYCERIN (NITROSTAT) 0.4 MG SL tablet, Place 1 tablet (0.4 mg total) under the tongue every 5 (five) minutes as needed. (Patient taking differently: Place 0.4 mg under the tongue every 5 (five) minutes as needed for chest pain.), Disp: 25 tablet, Rfl: 3   spironolactone (ALDACTONE) 25 MG tablet, Take 1 tablet (25 mg total) by mouth every morning., Disp: 30 tablet, Rfl: 2   amoxicillin (AMOXIL) 500 MG tablet, Take 4 tablets (2,000 mg total) by mouth as directed. Take 1 hour prior to dental work, including cleanings. (Patient not taking: Reported on 07/24/2023), Disp: 4 tablet, Rfl: 6    furosemide (LASIX) 20 MG tablet, Take 1 tablet (20 mg total) by mouth daily., Disp: , Rfl:    ASSESSMENT AND PLAN: .      ICD-10-CM   1. Permanent atrial fibrillation (HCC)  I48.21 EKG 12-Lead    Basic Metabolic Panel (BMET)    2. Acute on chronic diastolic congestive heart failure (HCC)  I50.33 spironolactone (ALDACTONE) 25 MG tablet    furosemide (LASIX) 20 MG tablet    Basic Metabolic Panel (BMET)    Pro b natriuretic peptide    3. History of transcatheter aortic valve replacement (TAVR)  23 mm Edwards SAPIEN 3 THV on 11/25/2018.  Z95.2 Basic Metabolic Panel (BMET)    4. S/P mitral valve repair  [id number] Basic Metabolic Panel (BMET)    5. Essential hypertension  I10 Basic Metabolic Panel (BMET)      Assessment and Plan    Acute on chronid Heart Failure  with Preserved LVEF Worsening dyspnea and fluid overload required hospitalization, with 6 liters of fluid removed.  Physical examination at the apex reveals very prominent pansystolic murmur.  I personally reviewed the images, patient was in A-fib with RVR hence were extremely difficult to make out if the mitral regurgitation is severe however there appeared to be systolic Doppler signal consistent with either hyperdynamic LVEF versus MR.  This needs to be further determined once he is clinically stable and we may have to consider doing the TEE at some point to reevaluate his valvular function.  Diuretics manage fluid overload, balancing fluid removal and renal function preservation. Spironolactone is added to manage fluid and support cardiac function while minimizing renal damage. Decrease Lasix to 20 mg once daily and discontinue potassium supplement. Start spironolactone 25 mg once daily. Order blood work (BMP) and BNP in 10 days.  Monitor renal function closely.  Mitral Valve Regurgitation   There is suspicion of severe mitral valve regurgitation based on auscultation, despite the echocardiogram not showing significant abnormalities.  Plan to review echocardiogram images personally and consider TEE for detailed valve function assessment. There is a history of valve replacement with current concern about the mitral valve. If performed, TEE will be under sedation for comfort.  Patient has history of TAVR and also mitral valve repair.  Patient is presently 89 years of age but still continues to remain fairly active.  It was a pleasure to see him today.  Blood pressure is well-controlled, continue diltiazem 30 mg 3 times daily, metoprolol tartrate 50 mg p.o. twice daily and Lasix 20 mg daily, Aldactone 25 mg daily has been added today.  Chronic Kidney Disease   There is a risk of renal damage due to diuretics for fluid management. The expected decrease in renal function aims to be temporary and reversible. Age contributes to renal impairment risk. Spironolactone is intended for a gentler diuretic effect while preserving renal function. Monitor renal function with blood work (BMP) in 10 days. Adjust diuretic therapy to minimize renal damage.           Signed,  Yates Decamp, MD, [facility] 07/24/2023, 8:36 PM [facility] [address] [place], [place] [address] Phone: [phone]. Fax:  [phone]

## 2023-07-24 NOTE — Patient Instructions (Addendum)
 Medication Instructions:  Your physician has recommended you make the following change in your medication: Stop potassium Start spironolactone 25 mg by mouth daily  Decrease furosemide to 20 mg by mouth daily   *If you need a refill on your cardiac medications before your next appointment, please call your pharmacy*   Lab Work: Have lab work done in 10 days.  BMP and BNP Can be done at any [facility].  There is an office on the first floor of our building If you have labs (blood work) drawn today and your tests are completely normal, you will receive your results only by: MyChart Message (if you have MyChart) OR A paper copy in the mail If you have any lab test that is abnormal or we need to change your treatment, we will call you to review the results.   Testing/Procedures: none   Follow-Up: At [facility], you and your health needs are our priority.  As part of our continuing mission to provide you with exceptional heart care, we have created designated Provider Care Teams.  These Care Teams include your primary Cardiologist (physician) and Advanced Practice Providers (APPs -  Physician Assistants and Nurse Practitioners) who all work together to provide you with the care you need, when you need it.  We recommend signing up for the patient portal called "MyChart".  Sign up information is provided on this After Visit Summary.  MyChart is used to connect with patients for Virtual Visits (Telemedicine).  Patients are able to view lab/test results, encounter notes, upcoming appointments, etc.  Non-urgent messages can be sent to your provider as well.   To learn more about what you can do with MyChart, go to [email or web address].    Your next appointment:   3/20 at 2:20  Provider:   Yates Decamp, MD     Other Instructions

## 2023-08-05 ENCOUNTER — Encounter: Payer: Self-pay | Admitting: Cardiology

## 2023-08-05 LAB — BASIC METABOLIC PANEL
BUN/Creatinine Ratio: 15 (ref 10–24)
BUN: 19 mg/dL (ref 10–36)
CO2: 23 mmol/L (ref 20–29)
Calcium: 9.8 mg/dL (ref 8.6–10.2)
Chloride: 102 mmol/L (ref 96–106)
Creatinine, Ser: 1.23 mg/dL (ref 0.76–1.27)
Glucose: 117 mg/dL — ABNORMAL HIGH (ref 70–99)
Potassium: 4.6 mmol/L (ref 3.5–5.2)
Sodium: 138 mmol/L (ref 134–144)
eGFR: 55 mL/min/{1.73_m2} — ABNORMAL LOW (ref >59)

## 2023-08-05 LAB — PRO B NATRIURETIC PEPTIDE: NT-Pro BNP: 966 pg/mL — ABNORMAL HIGH (ref 0–486)

## 2023-08-05 NOTE — Progress Notes (Signed)
 NT pro BNP is elevated and presently on appropriate medical therapy. Kidney function relatively stable on Spironolactone and continue present management

## 2023-08-06 ENCOUNTER — Ambulatory Visit: Payer: Medicare Other | Admitting: Cardiovascular Disease

## 2023-08-08 ENCOUNTER — Encounter: Payer: Self-pay | Admitting: Cardiology

## 2023-08-08 ENCOUNTER — Ambulatory Visit: Attending: Cardiology | Admitting: Cardiology

## 2023-08-08 VITALS — BP 136/72 | HR 88 | Resp 16 | Ht 72.0 in | Wt 179.0 lb

## 2023-08-08 DIAGNOSIS — I340 Nonrheumatic mitral (valve) insufficiency: Secondary | ICD-10-CM | POA: Diagnosis not present

## 2023-08-08 DIAGNOSIS — I4821 Permanent atrial fibrillation: Secondary | ICD-10-CM

## 2023-08-08 DIAGNOSIS — I5032 Chronic diastolic (congestive) heart failure: Secondary | ICD-10-CM | POA: Diagnosis not present

## 2023-08-08 DIAGNOSIS — Z9889 Other specified postprocedural states: Secondary | ICD-10-CM

## 2023-08-08 DIAGNOSIS — Z952 Presence of prosthetic heart valve: Secondary | ICD-10-CM

## 2023-08-08 MED ORDER — LOSARTAN POTASSIUM 25 MG PO TABS: 25.0000 mg | ORAL_TABLET | Freq: Every evening | ORAL | 2 refills | Status: DC

## 2023-08-08 NOTE — Progress Notes (Signed)
 Honey what is what is that Cardiology Office Note:  .   Date:  08/10/2023  ID:  Eric [name], DOB 1929-12-03, MRN [id number] PCP: Aliene Beams, MD  [facility] Providers Cardiologist:  Yates Decamp, MD   History of Present Illness: .   Eric [name] is a 89 y.o. Caucasian male patient who is a nonagenarian with bioprosthetic aortic valve replacement along with MV repair and single SVG to OM1 CABG in 2009.  Presented with atypical atrial flutter SP cardioversion in 2015 and again in 2019 when he presented with heart failure and A-fib in [place] along with severe AI.  Underwent successful TAVR with placement of a 23 mm Edwards SAPIEN 3 THV on 11/25/2018. He also has a small ascending aortic fusiform aneurysm.  Last admission to the hospital was on 07/02/2023 with acute decompensated heart failure and A-fib with RVR needing IV diuresis.  He presents for a 2-week follow-up visit of acute decompensated heart failure, at suspected severe mitral regurgitation by auscultation although echocardiogram during hospitalization in February had not revealed any significant valvular abnormality.  I had started him on spironolactone and decreased his Lasix to 20 mg daily.  Discussed the use of AI scribe software for clinical note transcription with the patient, who gave verbal consent to proceed.  History of Present Illness Mr. Eric [name], a patient with a history of heart failure, atrial fibrillation, mitral valve repair, and aortic valve replacement, presents with a recent history of fluid retention. This has led to weight gain and leg swelling. Since starting on spironolactone, the patient has lost the excess fluid weight, with a total weight loss of 10 pounds since the last visit. The patient reports feeling better and has resumed some physical activities, including working walking and working out twice a week. However, the patient still experiences some breathlessness, particularly when climbing stairs.    Wife reports that the patient's leg swelling has completely resolved. The patient is also on metoprolol tartrate and apixaban (Eliquis) for atrial fibrillation. The patient has a history of taking aspirin, but this has been discontinued due to the risk of bleeding.   Labs   Lab Results  Component Value Date   CHOL 127 11/25/2020   HDL 50 11/25/2020   LDLCALC 66 11/25/2020   TRIG 57 11/25/2020   CHOLHDL 2.5 11/25/2020   Lab Results  Component Value Date   NA 138 08/05/2023   K 4.6 08/05/2023   CO2 23 08/05/2023   GLUCOSE 117 (H) 08/05/2023   BUN 19 08/05/2023   CREATININE 1.23 08/05/2023   CALCIUM 9.8 08/05/2023   GFR 92.73 12/07/2014   EGFR 55 (L) 08/05/2023   GFRNONAA >60 07/02/2023      Latest Ref Rng & Units 08/05/2023   11:12 AM 07/02/2023    6:16 AM 07/01/2023    6:40 AM  BMP  Glucose 70 - 99 mg/dL 409  811  914   BUN 10 - 36 mg/dL 19  23  18    Creatinine 0.76 - 1.27 mg/dL 7.82  9.56  2.13   BUN/Creat Ratio 10 - 24 15     Sodium 134 - 144 mmol/L 138  135  137   Potassium 3.5 - 5.2 mmol/L 4.6  3.3  3.3   Chloride 96 - 106 mmol/L 102  96  98   CO2 20 - 29 mmol/L 23  28  28    Calcium 8.6 - 10.2 mg/dL 9.8  8.7  8.8  Latest Ref Rng & Units 07/01/2023    6:40 AM 06/30/2023    6:26 AM 06/29/2023    5:34 PM  CBC  WBC 4.0 - 10.5 K/uL 6.9  6.5  7.3   Hemoglobin 13.0 - 17.0 g/dL 62.1  30.8  65.7   Hematocrit 39.0 - 52.0 % 41.6  40.7  42.4   Platelets 150 - 400 K/uL 176  173  172    Lab Results  Component Value Date   HGBA1C 5.8 (H) 11/25/2020    Lab Results  Component Value Date   TSH 3.010 11/25/2019    BNP (last 3 results) Recent Labs    06/29/23 0848  BNP 225.9*    ProBNP (last 3 results) Recent Labs    08/05/23 1112  PROBNP 966*   Review of Systems  Cardiovascular:  Negative for chest pain, dyspnea on exertion and leg swelling.   Physical Exam:   VS:  BP 136/72 (BP Location: Left Arm, Patient Position: Sitting, Cuff Size: Normal)   Pulse 88    Resp 16   Ht 6' (1.829 m)   Wt 179 lb (81.2 kg)   SpO2 96%   BMI 24.28 kg/m    Wt Readings from Last 3 Encounters:  08/08/23 179 lb (81.2 kg)  07/24/23 189 lb (85.7 kg)  07/03/23 185 lb 12.8 oz (84.3 kg)    Physical Exam Neck:     Vascular: No JVD.  Cardiovascular:     Rate and Rhythm: Normal rate and regular rhythm.     Pulses: Intact distal pulses.     Heart sounds: S1 normal and S2 normal. Murmur heard.     Holosystolic murmur is present with a grade of 3/6 at the apex radiating to the axilla.     No gallop.  Pulmonary:     Effort: Pulmonary effort is normal.     Breath sounds: Normal breath sounds.  Abdominal:     General: Bowel sounds are normal.     Palpations: Abdomen is soft.  Musculoskeletal:     Right lower leg: No edema.     Left lower leg: No edema.    Studies Reviewed: Marland Kitchen    ECHOCARDIOGRAM COMPLETE 07/01/2023  1. Left ventricular ejection fraction, by estimation, is 70 to 75%. The left ventricle has hyperdynamic function. The left ventricle has no regional wall motion abnormalities. There is mild left ventricular hypertrophy. Left ventricular diastolic function could not be evaluated. 2. Right ventricular systolic function is normal. The right ventricular size is mildly enlarged. 3. Left atrial size was mildly dilated. 4. Right atrial size was moderately dilated. 5. The mitral valve has been repaired/replaced. Trivial mitral valve regurgitation. Mild mitral stenosis. 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 6. The aortic valve has been repaired/replaced. Aortic valve regurgitation is not visualized. No aortic stenosis is present. There is a 23 mm Sapien prosthetic (TAVR) valve present in the aortic position. Echo findings are consistent with normal structure and function of the aortic valve prosthesis. 7. Aortic dilatation noted. There is mild dilatation of the ascending aorta, measuring 41 mm. 8. The inferior vena cava is normal  in size with greater than 50% respiratory variability, suggesting right atrial pressure of 3 mmHg.  EKG:         EKG 07/24/2023: Atrial fibrillation with controlled ventricular response at the rate of 58 bpm, low-voltage complexes otherwise normal EKG.   Medications and allergies    No Known Allergies   Current Outpatient  Medications:    amoxicillin (AMOXIL) 500 MG tablet, Take 4 tablets (2,000 mg total) by mouth as directed. Take 1 hour prior to dental work, including cleanings., Disp: 4 tablet, Rfl: 6   atorvastatin (LIPITOR) 20 MG tablet, Take 40 mg by mouth every evening., Disp: , Rfl:    diltiazem (CARDIZEM) 30 MG tablet, Take 1 tablet (30 mg total) by mouth 3 (three) times daily., Disp: 90 tablet, Rfl: 2   ELIQUIS 5 MG TABS tablet, TAKE 1 TABLET BY MOUTH TWICE  DAILY, Disp: 180 tablet, Rfl: 3   Eyelid Cleansers (OCUSOFT EYELID CLEANSING) PADS, Place 1 application into the left eye in the morning., Disp: , Rfl:    ezetimibe (ZETIA) 10 MG tablet, TAKE 1 TABLET BY MOUTH IN THE  EVENING, Disp: 90 tablet, Rfl: 3   losartan (COZAAR) 25 MG tablet, Take 1 tablet (25 mg total) by mouth every evening., Disp: 30 tablet, Rfl: 2   nitroGLYCERIN (NITROSTAT) 0.4 MG SL tablet, Place 1 tablet (0.4 mg total) under the tongue every 5 (five) minutes as needed. (Patient taking differently: Place 0.4 mg under the tongue every 5 (five) minutes as needed for chest pain.), Disp: 25 tablet, Rfl: 3   spironolactone (ALDACTONE) 25 MG tablet, Take 1 tablet (25 mg total) by mouth every morning., Disp: 30 tablet, Rfl: 2   furosemide (LASIX) 20 MG tablet, Take 1 tablet (20 mg total) by mouth daily., Disp: 90 tablet, Rfl: 3   metoprolol tartrate (LOPRESSOR) 25 MG tablet, Take 3 tablets (75 mg total) by mouth 2 (two) times daily., Disp: 540 tablet, Rfl: 2   ASSESSMENT AND PLAN: .      ICD-10-CM   1. Permanent atrial fibrillation (HCC)  I48.21 Basic Metabolic Panel (BMET)    DISCONTINUED: metoprolol tartrate 75 MG  TABS    2. Chronic diastolic congestive heart failure (HCC)  I50.32 losartan (COZAAR) 25 MG tablet    3. History of mitral valve repair  Z98.890 ECHOCARDIOGRAM COMPLETE    4. Severe mitral regurgitation  I34.0 ECHOCARDIOGRAM COMPLETE    5. 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    Dispense:  30 tablet    Refill:  2  Metoprolol tartrate 50 mg Sig: Take 1.5 tablets twice daily  Medications Discontinued During This Encounter  Medication Reason   aspirin EC 81 MG tablet Discontinued by provider   metoprolol tartrate (LOPRESSOR) 50 MG tablet Dose change   Assessment and Plan Assessment & Plan Heart Failure-acute on chronic diastolic heart failure Symptoms have significantly improved, with resolution of leg swelling and better breathing. A 10-pound weight loss is attributed to fluid loss, indicating effective fluid overload management.  He should be cautious with dietary sodium intake to prevent exacerbation. Continue spironolactone. Educated on a low-sodium diet, avoiding high-sodium foods, especially when dining out. Consider protein supplementation with Ensure.  Atrial Fibrillation (AFib)   Permanent atrial fibrillation is managed with apixaban for anticoagulation. Heart rate is well-controlled with metoprolol tartrate, which is being adjusted. Aspirin has been discontinued due to increased bleeding risk.  Continue apixaban 5 mg twice daily. Discontinue aspirin.  Increase metoprolol tartrate to 75 mg twice daily to improve rate control. Add losartan 25 mg once daily for diastolic heart failure.  Mitral Valve Regurgitation   Mitral valve regurgitation is suspected based on auscultation, not  detected in the previous echocardiogram during hospitalization due to high heart rate with A. Fib with RVR . A repeat echocardiogram is  necessary to assess the current status. If a transthoracic echocardiogram is not approved by insurance, consider a TEE for better visualization. Order a transthoracic echocardiogram with a diagnosis of severe mitral regurgitation and history of mitral valve repair. If not approved, schedule a transesophageal echocardiogram (TEE). Patient does have a history of mitral valve repair and also TAVR.  Follow-up   Close monitoring is required due to the complexity of his condition and recent medication adjustments. Immediate contact is advised if symptoms worsen, such as weight gain or leg swelling. Schedule a follow-up appointment in 8 weeks. Perform blood work in 3 weeks to monitor kidney function and BNP levels. Instruct to call if symptoms worsen, such as weight gain or leg swelling.  I spent 40 minutes with the patient and reviewing the medications with his wife and son at the bedside, discussions regarding heart failure, discussions regarding diet management, evaluation of valvular heart disease.     Signed,  Yates Decamp, MD, [facility] 08/10/2023, 8:23 AM [facility] [address] [place], [place] [address] Phone: [phone]. Fax:  [phone]

## 2023-08-08 NOTE — Patient Instructions (Signed)
 Medication Instructions:  Your physician has recommended you make the following change in your medication: Increase metoprolol tartrate to 75 mg by mouth twice daily  Start Losartan 25 mg by mouth daily   *If you need a refill on your cardiac medications before your next appointment, please call your pharmacy*   Lab Work: Have lab work checked in 3 weeks.  Can be done at any [facility].  This is not fasting If you have labs (blood work) drawn today and your tests are completely normal, you will receive your results only by: MyChart Message (if you have MyChart) OR A paper copy in the mail If you have any lab test that is abnormal or we need to change your treatment, we will call you to review the results.   Testing/Procedures: Your physician has requested that you have an echocardiogram. Echocardiography is a painless test that uses sound waves to create images of your heart. It provides your doctor with information about the size and shape of your heart and how well your heart's chambers and valves are working. This procedure takes approximately one hour. There are no restrictions for this procedure. Please do NOT wear cologne, perfume, aftershave, or lotions (deodorant is allowed). Please arrive 15 minutes prior to your appointment time.  Please note: We ask at that you not bring children with you during ultrasound (echo/ vascular) testing. Due to room size and safety concerns, children are not allowed in the ultrasound rooms during exams. Our front office staff cannot provide observation of children in our lobby area while testing is being conducted. An adult accompanying a patient to their appointment will only be allowed in the ultrasound room at the discretion of the ultrasound technician under special circumstances. We apologize for any inconvenience.    Follow-Up: At [facility], you and your health needs are our priority.  As part of our continuing mission to provide you with  exceptional heart care, we have created designated Provider Care Teams.  These Care Teams include your primary Cardiologist (physician) and Advanced Practice Providers (APPs -  Physician Assistants and Nurse Practitioners) who all work together to provide you with the care you need, when you need it.  We recommend signing up for the patient portal called "MyChart".  Sign up information is provided on this After Visit Summary.  MyChart is used to connect with patients for Virtual Visits (Telemedicine).  Patients are able to view lab/test results, encounter notes, upcoming appointments, etc.  Non-urgent messages can be sent to your provider as well.   To learn more about what you can do with MyChart, go to [email or web address].    Your next appointment:   2 month(s)  Provider:   Yates Decamp, MD     Other Instructions

## 2023-08-09 ENCOUNTER — Telehealth: Payer: Self-pay | Admitting: Cardiology

## 2023-08-09 DIAGNOSIS — I4821 Permanent atrial fibrillation: Secondary | ICD-10-CM

## 2023-08-09 DIAGNOSIS — I5033 Acute on chronic diastolic (congestive) heart failure: Secondary | ICD-10-CM

## 2023-08-09 MED ORDER — METOPROLOL TARTRATE 25 MG PO TABS: 75.0000 mg | ORAL_TABLET | Freq: Two times a day (BID) | ORAL | 2 refills | Status: DC

## 2023-08-09 MED ORDER — FUROSEMIDE 20 MG PO TABS: 20.0000 mg | ORAL_TABLET | Freq: Every day | ORAL | 3 refills | Status: DC

## 2023-08-09 MED ORDER — METOPROLOL TARTRATE 75 MG PO TABS: 75.0000 mg | ORAL_TABLET | Freq: Two times a day (BID) | ORAL | 3 refills | Status: DC

## 2023-08-09 NOTE — Telephone Encounter (Signed)
 For the metoprolol wife called back to get the pill 25mg  a piece. Where he will take 3 pills in the morning and 3 pills int he evening. Patient can't handle swelling big pills. Please advise

## 2023-08-09 NOTE — Telephone Encounter (Signed)
*  STAT* If patient is at the pharmacy, call can be transferred to refill team.   1. Which medications need to be refilled? (please list name of each medication and dose if known) metoprolol tartrate 75 MG TABS  furosemide (LASIX) 20 MG tablet  2. Which pharmacy/location (including street and city if local pharmacy) is medication to be sent to? [facility]) - [place], [place] - [address] [phone]   3. Do they need a 30 day or 90 day supply? 90

## 2023-08-09 NOTE — Telephone Encounter (Signed)
 I spoke with patient's wife and let her know prescription would be sent to [facility] for 3 of the 25 mg tablets twice daily .

## 2023-08-09 NOTE — Telephone Encounter (Signed)
 For the metoprolol wife called back to get the pill 25mg  a piece. Where he will take 3 pills in the morning and 3 pills int he evening. Patient can't handle swelling big pills. Please advise   Please see below

## 2023-08-09 NOTE — Telephone Encounter (Signed)
 Pt's medications were sent to pt's pharmacy as requested. Confirmation received.

## 2023-08-19 ENCOUNTER — Telehealth: Payer: Self-pay | Admitting: Cardiology

## 2023-08-19 NOTE — Telephone Encounter (Signed)
 Erica from pcp office called to advise that pt was Seen in office 2/28 bp was running low so she made some med adjustments- 90's/40's Decreased spironolactone (ALDACTONE) 25 MG tablet to 1/2 tab in morning losartan (COZAAR) 25 MG tablet decreased to 1/2 tab in the evenings Pt will F/u in 1 mo for recheck along w/ blood work Can reach out to pcp to discuss if needed

## 2023-08-31 ENCOUNTER — Encounter: Payer: Self-pay | Admitting: Cardiology

## 2023-08-31 LAB — BASIC METABOLIC PANEL WITH GFR
BUN/Creatinine Ratio: 20 (ref 10–24)
BUN: 22 mg/dL (ref 10–36)
CO2: 22 mmol/L (ref 20–29)
Calcium: 9.5 mg/dL (ref 8.6–10.2)
Chloride: 102 mmol/L (ref 96–106)
Creatinine, Ser: 1.11 mg/dL (ref 0.76–1.27)
Glucose: 102 mg/dL — ABNORMAL HIGH (ref 70–99)
Potassium: 4.4 mmol/L (ref 3.5–5.2)
Sodium: 140 mmol/L (ref 134–144)
eGFR: 63 mL/min/{1.73_m2} (ref >59)

## 2023-08-31 NOTE — Progress Notes (Signed)
 Renal function is normal on losartan, continue present medications.

## 2023-09-02 ENCOUNTER — Ambulatory Visit (HOSPITAL_COMMUNITY): Attending: Cardiology

## 2023-09-02 DIAGNOSIS — Z952 Presence of prosthetic heart valve: Secondary | ICD-10-CM | POA: Diagnosis present

## 2023-09-02 DIAGNOSIS — I340 Nonrheumatic mitral (valve) insufficiency: Secondary | ICD-10-CM | POA: Insufficient documentation

## 2023-09-02 DIAGNOSIS — Z9889 Other specified postprocedural states: Secondary | ICD-10-CM | POA: Insufficient documentation

## 2023-09-02 LAB — ECHOCARDIOGRAM COMPLETE
AR max vel: 2.14 cm2
AV Area VTI: 2.32 cm2
AV Area mean vel: 2 cm2
AV Mean grad: 10 mmHg
AV Peak grad: 18.3 mmHg
Ao pk vel: 2.14 m/s
Area-P 1/2: 2.89 cm2
MV M vel: 5.09 m/s
MV Peak grad: 103.6 mmHg
Radius: 1 cm
S' Lateral: 2.6 cm

## 2023-09-03 NOTE — Progress Notes (Signed)
 Eric [name], He is very active and CHF now resolved with medical therapy. Echo markedly abnormal with severe MR and I suspect he must have had a flail leaflet recently. He is 59 Y otherwise in relatively good health. Not sure surgeons will be thrilled with operating on him and also probably family would prefer less invasive Rx. Thoughts

## 2023-09-03 NOTE — Progress Notes (Signed)
 Please place a referral to Dr. Lorie Rook for evaluation of mitral regurgitation

## 2023-09-10 ENCOUNTER — Telehealth: Payer: Self-pay | Admitting: Cardiology

## 2023-09-10 DIAGNOSIS — I4821 Permanent atrial fibrillation: Secondary | ICD-10-CM

## 2023-09-10 MED ORDER — DILTIAZEM HCL 30 MG PO TABS: 30.0000 mg | ORAL_TABLET | Freq: Three times a day (TID) | ORAL | 3 refills | Status: DC

## 2023-09-10 NOTE — Telephone Encounter (Signed)
 Pt's medication was sent to pt's pharmacy as requested. Confirmation received.

## 2023-09-10 NOTE — Telephone Encounter (Signed)
*  STAT* If patient is at the pharmacy, call can be transferred to refill team.   1. Which medications need to be refilled? (please list name of each medication and dose if known) diltiazem  (CARDIZEM ) 30 MG tablet   4. Which pharmacy/location (including street and city if local pharmacy) is medication to be sent to?  [id number] - [place], [place] - [address]     5. Do they need a 30 day or 90 day supply? 90

## 2023-09-22 NOTE — H&P (View-Only) (Signed)
 Cardiology Office Note:    Date:  10/01/2023   ID:  Eric [name], DOB 06-23-1929, MRN [id number]  PCP:  Dorena Gander, MD   [facility] Providers Cardiologist:  Knox Perl, MD     Referring MD: Dorena Gander, MD   Chief Complaint  Patient presents with   Shortness of Breath    History of Present Illness:    Eric [name] is a 89 y.o. male referred by Dr Berry Bristol for evaluation of severe mitral regurgitation.  He is here with his family members today to discuss treatment options for mitral regurgitation.  The patient was in his normal state of health until February 2025 when he was hospitalized with atrial fibrillation with RVR and heart failure.  He required IV diuresis.  He had previously done quite well since his TAVR procedure in 2020.  The patient presented with weight gain, leg swelling, and progressive dyspnea.  He did not have any chest pain or pressure.  He now his better compensated on his medical regimen and complains only of fatigue and exertional dyspnea with a moderate level activity.  He has to stop and catch his breath with walking.  No further edema, orthopnea, or PND.  He underwent single vessel CABG SVG-OM, bioprosthetic AVR with a 23 mm Mitroflow valve, and mitral valve repair with a 28 mm Sorin MenoIII-V mitral annuloplasty ring in 2009. He later underwent valve in valve TAVR in 2020 with a 23 mm Edwards S3 valve.  The patient was hospitalized in February 2025 with acute on chronic HFpEF.  He was diuresed with symptomatic improvement.  There was concern about his loud heart murmur and suspicion for mitral regurgitation, but it was difficult to evaluate in the setting of atrial fibrillation with RVR.  A follow-up echocardiogram was recently done and demonstrates severe mitral regurgitation with a mean transmitral gradient of 8 mmHg at an average heart rate of 68 bpm.  LVEF is 55 to 60%.  Current Medications: Current Meds  Medication Sig   amoxicillin  (AMOXIL )  500 MG tablet Take 4 tablets (2,000 mg total) by mouth as directed. Take 1 hour prior to dental work, including cleanings.   atorvastatin  (LIPITOR) 20 MG tablet Take 40 mg by mouth every evening.   diltiazem  (CARDIZEM ) 30 MG tablet Take 1 tablet (30 mg total) by mouth 3 (three) times daily.   ELIQUIS  5 MG TABS tablet TAKE 1 TABLET BY MOUTH TWICE  DAILY   Eyelid Cleansers (OCUSOFT EYELID CLEANSING) PADS Place 1 application into the left eye in the morning.   ezetimibe  (ZETIA ) 10 MG tablet TAKE 1 TABLET BY MOUTH IN THE  EVENING   furosemide  (LASIX ) 20 MG tablet Take 1 tablet (20 mg total) by mouth daily.   losartan  (COZAAR ) 25 MG tablet Take 1 tablet (25 mg total) by mouth every evening. (Patient taking differently: Take 12.5 mg by mouth every evening.)   metoprolol  tartrate (LOPRESSOR ) 25 MG tablet Take 3 tablets (75 mg total) by mouth 2 (two) times daily.   spironolactone  (ALDACTONE ) 25 MG tablet Take 1 tablet (25 mg total) by mouth every morning. (Patient taking differently: Take 12.5 mg by mouth daily.)     Allergies:   Patient has no known allergies.   ROS:   Please see the history of present illness.    All other systems reviewed and are negative.  EKGs/Labs/Other Studies Reviewed:    The following studies were reviewed today: Cardiac Studies & Procedures   ______________________________________________________________________________________________ CARDIAC CATHETERIZATION  CARDIAC CATHETERIZATION 10/29/2018  Conclusion 1.  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  Single-vessel coronary artery disease with severe stenosis of the proximal left circumflex and continued patency of the saphenous vein graft to first OM 3.  Patent left main, LAD, and RCA with mild diffuse nonobstructive disease 4.  Normal right heart pressures and preserved  cardiac output  Recommend: Continued multidisciplinary team evaluation with CTA studies and formal surgical consultation with Dr. Sherene Dilling in anticipation of possible valve-in-valve TAVR  Findings Coronary Findings Diagnostic  Dominance: Right  Left Main Mid LM to Dist LM lesion is 25% stenosed. The lesion is mildly calcified.  Left Anterior Descending Vessel is large. The vessel exhibits minimal luminal irregularities. The LAD is a large vessel that courses to the LV apex.  The vessel has minor irregularities but no significant stenoses.  The first diagonal is large in caliber.  Left Circumflex Prox Cx lesion is 75% stenosed.  Right Coronary Artery There is mild diffuse disease throughout the vessel. Mid RCA lesion is 40% stenosed. The lesion is moderately calcified.  Saphenous Graft To 1st Mrg SVG graft was visualized by angiography and is normal in caliber. The graft exhibits no disease. The saphenous vein graft to first OM is widely patent throughout.  There are no significant stenoses.  The graft fills the circumflex and retrograde fashion.  Intervention  No interventions have been documented.   STRESS TESTS  MYOCARDIAL PERFUSION IMAGING 10/26/2022  Narrative   The study is normal. Findings are consistent with no ischemia and no infarction. The study is high risk.   No ST deviation was noted.   LV perfusion is normal. There is no evidence of ischemia. There is no evidence of infarction.   Left ventricular function is normal. Nuclear stress EF: 57 %. The left ventricular ejection fraction is normal (55-65%). End diastolic cavity size is normal. End systolic cavity size is normal.   Prior study available for comparison from 11/02/2010.   ECHOCARDIOGRAM  ECHOCARDIOGRAM COMPLETE 09/02/2023  Narrative ECHOCARDIOGRAM REPORT    Patient Name:   Eric [name] Date of Exam: 09/02/2023 Medical Rec #:  [id number]       Height:       72.0 in Accession #:    [id number]       Weight:       179.0 lb Date of Birth:  May 16, 1930       BSA:          2.032 m Patient Age:    89 years        BP:           136/72 mmHg Patient Gender: M               HR:           55 bpm. Exam Location:  [address]  Procedure: 2D Echo, Cardiac Doppler, Color Doppler and 3D Echo (Both Spectral and Color Flow Doppler were utilized during procedure).  Indications:    Mitral Valve Disorder I05.9  History:        Patient has prior history of Echocardiogram examinations, most recent 07/01/2023. Arrythmias:Atrial Flutter and Atrial Fibrillation. Aortic Valve: 23 mm Sapien prosthetic, stented (TAVR) valve is present in the aortic position. Procedure Date: 11/25/2018. Mitral Valve: prosthetic annuloplasty ring valve is present in the mitral position. Procedure Date: 06/19/2007.  Sonographer:    Gabriel John  Alecia Ames RDCS Referring Phys: [id number] Knox Perl  IMPRESSIONS   1. S/p MV repair. Severe MR is now present with eccentric anteriorly directed jet. The mitral valve has been repaired/replaced. Severe mitral valve regurgitation. Mild mitral stenosis. The mean mitral valve gradient is 8.0 mmHg with average heart rate of 68 bpm. 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 Procedure Date: 06/19/2007. 2. Left ventricular ejection fraction, by estimation, is 55 to 60%. The left ventricle has normal function. The left ventricle has no regional wall motion abnormalities. There is mild concentric left ventricular hypertrophy. Left ventricular diastolic function could not be evaluated. 3. Right ventricular systolic function is moderately reduced. The right ventricular size is moderately enlarged. There is mildly elevated pulmonary artery systolic pressure. The estimated right ventricular systolic pressure is 44.5 mmHg. 4. Left atrial size was severely dilated. 5. Right atrial size was severely dilated. 6. Tricuspid valve regurgitation is moderate. 7. Trivial PVL. The aortic  valve has been repaired/replaced. Aortic valve regurgitation is trivial. There is a 23 mm Sapien prosthetic (TAVR) valve present in the aortic position. Procedure Date: 11/25/2018. Echo findings are consistent with normal structure and function of the aortic valve prosthesis. Aortic valve area, by VTI measures 2.32 cm. Aortic valve mean gradient measures 10.0 mmHg. Aortic valve Vmax measures 2.14 m/s. 8. Aortic dilatation noted. There is mild dilatation of the aortic root, measuring 40 mm. There is mild dilatation of the ascending aorta, measuring 43 mm. 9. The inferior vena cava is normal in size with greater than 50% respiratory variability, suggesting right atrial pressure of 3 mmHg.  Comparison(s): Changes from prior study are noted. Severe MR is now present.  FINDINGS Left Ventricle: Left ventricular ejection fraction, by estimation, is 55 to 60%. The left ventricle has normal function. The left ventricle has no regional wall motion abnormalities. 3D ejection fraction reviewed and evaluated as part of the interpretation. Alternate measurement of EF is felt to be most reflective of LV function. The left ventricular internal cavity size was normal in size. There is mild concentric left ventricular hypertrophy. Abnormal (paradoxical) septal motion consistent with post-operative status. Left ventricular diastolic function could not be evaluated due to atrial fibrillation. Left ventricular diastolic function could not be evaluated.  Right Ventricle: The right ventricular size is moderately enlarged. No increase in right ventricular wall thickness. Right ventricular systolic function is moderately reduced. There is mildly elevated pulmonary artery systolic pressure. The tricuspid regurgitant velocity is 3.22 m/s, and with an assumed right atrial pressure of 3 mmHg, the estimated right ventricular systolic pressure is 44.5 mmHg.  Left Atrium: Left atrial size was severely dilated.  Right Atrium:  Right atrial size was severely dilated.  Pericardium: There is no evidence of pericardial effusion.  Mitral Valve: S/p MV repair. Severe MR is now present with eccentric anteriorly directed jet. The mitral valve has been repaired/replaced. Severe mitral valve regurgitation. 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 Procedure Date: 06/19/2007. Mild mitral valve stenosis. MV peak gradient, 25.0 mmHg. The mean mitral valve gradient is 8.0 mmHg with average heart rate of 68 bpm.  Tricuspid Valve: The tricuspid valve is grossly normal. Tricuspid valve regurgitation is moderate . No evidence of tricuspid stenosis.  Aortic Valve: Trivial PVL. The aortic valve has been repaired/replaced. Aortic valve regurgitation is trivial. Aortic valve mean gradient measures 10.0 mmHg. Aortic valve peak gradient measures 18.3 mmHg. Aortic valve area, by VTI measures 2.32 cm. There is a 23 mm Sapien  prosthetic, stented (TAVR) valve present in the aortic position. Procedure Date: 11/25/2018. Echo findings are consistent with normal structure and function of the aortic valve prosthesis.  Pulmonic Valve: The pulmonic valve was grossly normal. Pulmonic valve regurgitation is trivial. No evidence of pulmonic stenosis.  Aorta: Aortic dilatation noted. There is mild dilatation of the aortic root, measuring 40 mm. There is mild dilatation of the ascending aorta, measuring 43 mm.  Venous: The inferior vena cava is normal in size with greater than 50% respiratory variability, suggesting right atrial pressure of 3 mmHg.  IAS/Shunts: The atrial septum is grossly normal.   LEFT VENTRICLE PLAX 2D LVIDd:         4.10 cm LVIDs:         2.60 cm LV PW:         1.20 cm LV IVS:        1.20 cm LVOT diam:     2.10 cm   3D Volume EF: LV SV:         107       3D EF:        63 % LV SV Index:   53        LV EDV:       200 ml LVOT Area:     3.46 cm  LV ESV:       74 ml LV SV:        126 ml  RIGHT  VENTRICLE RV Basal diam:  4.40 cm RV Mid diam:    3.20 cm RV S prime:     7.62 cm/s TAPSE (M-mode): 2.4 cm  LEFT ATRIUM              Index        RIGHT ATRIUM           Index LA diam:        3.85 cm  1.89 cm/m   RA Area:     31.70 cm LA Vol (A2C):   100.0 ml 49.20 ml/m  RA Volume:   113.00 ml 55.60 ml/m LA Vol (A4C):   94.3 ml  46.40 ml/m LA Biplane Vol: 99.1 ml  48.76 ml/m AORTIC VALVE AV Area (Vmax):    2.14 cm AV Area (Vmean):   2.00 cm AV Area (VTI):     2.32 cm AV Vmax:           214.00 cm/s AV Vmean:          144.000 cm/s AV VTI:            0.463 m AV Peak Grad:      18.3 mmHg AV Mean Grad:      10.0 mmHg LVOT Vmax:         132.00 cm/s LVOT Vmean:        83.200 cm/s LVOT VTI:          0.310 m LVOT/AV VTI ratio: 0.67  AORTA Ao Root diam: 4.00 cm Ao Asc diam:  4.30 cm  MITRAL VALVE                  TRICUSPID VALVE MV Area (PHT): 2.89 cm       TR Peak grad:   41.5 mmHg MV Peak grad:  25.0 mmHg      TR Vmax:        322.00 cm/s MV Mean grad:  8.0 mmHg MV Vmax:       2.50 m/s  SHUNTS MV Vmean:      119.0 cm/s     Systemic VTI:  0.31 m MR Peak grad:    103.6 mmHg   Systemic Diam: 2.10 cm MR Mean grad:    58.0 mmHg MR Vmax:         509.00 cm/s MR Vmean:        351.0 cm/s MR PISA:         6.28 cm MR PISA Eff ROA: 45 mm MR PISA Radius:  1.00 cm  Jackquelyn Mass MD Electronically signed by Jackquelyn Mass MD Signature Date/Time: 09/02/2023/5:04:47 PM    Final   TEE  ECHO TEE 10/22/2018  Narrative TRANSESOPHOGEAL ECHO REPORT    Patient Name:   GEZA HARGAN Date of Exam: 10/22/2018 Medical Rec #:  [id number]       Height:       72.0 in Accession #:    [id number]      Weight:       187.0 lb Date of Birth:  [birth year]       BSA:          2.07 m Patient Age:    88 years        BP:           126/49 mmHg Patient Gender: M               HR:           74 bpm. Exam Location:  Inpatient   Procedure: Transesophageal Echo, Color Doppler, Cardiac Doppler  and 3D Echo  Indications:     I35.1 Nonrheumatic aortic (valve) insufficiency  History:         Patient has prior history of Echocardiogram examinations. Prior CABG Signs/Symptoms: Shortness of Breath. Aortic Valve: A 28 Mitralflow porcine aortic valve bioprosthesis  Sonographer:     Konnie Perry RDCS Referring Phys:  [id number] Lucendia Rusk Diagnosing Phys: Dorothye Gathers MD    PROCEDURE: Local oropharyngeal anesthetic was provided with viscous lidocaine . The transesophogeal probe was passed through the esophogus of the patient. The patient developed no complications during the procedure.  IMPRESSIONS   1. The left ventricle has low normal systolic function, with an ejection fraction of 50-55%. Left ventricular diastolic function could not be evaluated. No evidence of left ventricular regional wall motion abnormalities. 2. The right ventricle has normal systolc function. The cavity was normal. There is no increase in right ventricular wall thickness. 3. Left atrial size was mildly dilated. 4. Right atrial size was mildly dilated. 5. Moderately thickened tricuspid valve leaflets. 6. 28mm Sorin mitral valve repair annular ring intact. 7. 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 8. A 28 Mitralflow porcine bioprosthesis valve is present in the aortic position. Echo findings are consistent with intravalvular regurgitation of the aortic prosthesis. 9. Aortic valve regurgitation is severe by color flow Doppler. Mild stenosis of the aortic valve. 10. No vegetation on the aortic valve. 11. The aortic root is normal in size and structure. 12. Previously ligated left atrial appendage. 13. Severe bioprosthetic aortic valve regurgitation.  FINDINGS Left Ventricle: The left ventricle has low normal systolic function, with an ejection fraction of 50-55%. There is no increase in left ventricular wall thickness. Left ventricular diastolic function could  not be evaluated. No evidence of left ventricular regional wall motion abnormalities.  Right Ventricle: The right ventricle has normal systolic function. The cavity was normal. There is no increase in right ventricular wall thickness.  Left  Atrium: Left atrial size was mildly dilated.   Right Atrium: Right atrial size was mildly dilated. Right atrial pressure is estimated at 10 mmHg.  Interatrial Septum: No atrial level shunt detected by color flow Doppler.  Pericardium: There is no evidence of pericardial effusion.  Mitral Valve: The mitral valve has been repaired/replaced. Mitral valve regurgitation is mild by color flow Doppler. 28mm Sorin mitral valve repair annular ring intact.  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 Tricuspid valve regurgitation is moderate-severe by color flow Doppler. The tricuspid valve is moderately thickened.  Aortic Valve: The aortic valve has been repaired/replaced Aortic valve regurgitation is severe by color flow Doppler. There is Mild stenosis of the aortic valve. A 28 Mitralflow porcine aortic valve bioprosthesis valve is present in the aortic position. Echo findings are consistent with intravalvular regurgitation of the aortic prosthesis. There is no evidence of a vegetation on the aortic valve.  Pulmonic Valve: The pulmonic valve was grossly normal. Pulmonic valve regurgitation is not visualized by color flow Doppler.  Aorta: The aortic root is normal in size and structure.  Additional Findings: Severe bioprosthetic aortic valve regurgitation. Previously ligated left atrial appendage.   +-------------+------------++ AORTIC VALVE              +-------------+------------++ AV Vmax:     260.00 cm/s  +-------------+------------++ AV Vmean:    181.000 cm/s +-------------+------------++ AV VTI:      0.569 m      +-------------+------------++ AV Peak Grad:27.0 mmHg    +-------------+------------++ AV Mean  Grad:15.0 mmHg    +-------------+------------++ AR PHT:      303 msec     +-------------+------------++  +-------------+---------++ +---------------+-----------++ MITRAL VALVE           TRICUSPID VALVE            +-------------+---------++ +---------------+-----------++ MV Peak grad:8.4 mmHg  TR Peak grad:  26.6 mmHg   +-------------+---------++ +---------------+-----------++ MV Mean grad:3.0 mmHg  TR Vmax:       258.00 cm/s +-------------+---------++ +---------------+-----------++ MV Vmax:     1.45 m/s  +-------------+---------++ MV Vmean:    66.7 cm/s +-------------+---------++ MV VTI:      0.24 m    +-------------+---------++   Dorothye Gathers MD Electronically signed by Dorothye Gathers MD Signature Date/Time: 10/22/2018/2:41:47 PM    Final    CT SCANS  CT CORONARY MORPH W/CTA COR W/SCORE 11/03/2018  Addendum 11/03/2018  5:32 PM ADDENDUM REPORT: 11/03/2018 17:29  EXAM: OVER-READ INTERPRETATION  CT CHEST  The following report is an over-read performed by radiologist Dr. Selwyn Dalton [facility] on 11/03/2018. This over-read does not include interpretation of cardiac or coronary anatomy or pathology. The cardiac CTA interpretation by the cardiologist is attached.  COMPARISON:  11/11/2006 chest CT angiogram.  FINDINGS: Please see the separate concurrent chest CT angiogram report for details.  IMPRESSION: Please see the separate concurrent chest CT angiogram report for details.   Electronically Signed By: Levell Reach M.D. On: 11/03/2018 17:29  Narrative CLINICAL DATA:  AVR Pre Valve in Valve TAVR  EXAM: Cardiac TAVR CT  TECHNIQUE: The patient was scanned on a Siemens Force 192 slice scanner. A 120 kV retrospective scan was triggered in the ascending thoracic aorta at 140 HU's. Gantry rotation speed was 250 msecs and collimation was .6 mm. No beta blockade or nitro were given. The 3D data set  was reconstructed in 5% intervals of the R-R cycle. Systolic and diastolic phases were analyzed on a dedicated work station using MPR, MIP  and VRT modes. The patient received 80 cc of contrast.  FINDINGS: Aortic Valve: There is a 23 mm Mitroflow stented pericardial bioprosthetic in place. Sewing ring appears intact with no areas of dehiscence The leaflets are thickened and there appears to be prolapse of the right coronary cusp.  Aorta: Moderate calcific atherosclerosis No aneurysm. Normal arch vessels  Sino-tubular Junction: 29 mm  Ascending Thoracic Aorta: 36 mm  Aortic Arch: 29 mm  Descending Thoracic Aorta: 24 mm  Sinus of Valsalva Measurements:  Non-coronary: 29.4 mm  Right - coronary: 28 mm  Left -   coronary: 30.6 mm  Coronary Artery Height above Annulus:  Left Main: 10.6 mm above annulus  Right Coronary: 10.9 mm above annulus  Valve ID: 19.2 mm  Area inside sewing ring 334 mm2  There is a patent SVG to the OM branch. The patient has had a mitral valve repair with annuloplasty ring. The LAA was supposed to have been ligated but appears patent with no obvious thrombus  IMPRESSION: 1. 23 mm Mitroflow stented pericardial tissue valve with thickened leaflets and possible prolapse of right cusp.  2. On SA basal view LM is 7.3 mm from valve leaflet border and RCA is 7.4 mm away However there is concern for the coronary heights Even a 20 mm Sapein 3 is 14 mm in height  The RCA appears to rest behind the right leaflet with STJ calcium  and may be at higher risk of occlusion  3.  Normal aortic root 3.6 cm  4.  Post mitral valve repair with annuloplasty ring  5.  LAA is patent Surgical note indicates ligation  6.  Patent SVG to OM  Janelle Mediate  Electronically Signed: By: Janelle Mediate M.D. On: 11/03/2018 15:16     ______________________________________________________________________________________________      EKG:   EKG  Interpretation Date/Time:  Monday Sep 23 2023 17:04:35 EDT Ventricular Rate:  52 PR Interval:    QRS Duration:  94 QT Interval:  438 QTC Calculation: 407 R Axis:   30  Text Interpretation: Atrial fibrillation with slow ventricular response When compared with ECG of 24-Jul-2023 11:41, No significant change was found Confirmed by Arnoldo Lapping [phone]) on 10/01/2023 2:40:43 AM    Recent Labs: 06/29/2023: B Natriuretic Peptide 225.9 07/01/2023: ALT 29 07/02/2023: Magnesium  2.3 08/05/2023: NT-Pro BNP 966 09/27/2023: BUN 25; Creatinine, Ser 1.01; Hemoglobin 13.4; Platelets 142; Potassium 4.4; Sodium 137  Recent Lipid Panel    Component Value Date/Time   CHOL 127 11/25/2020 0258   CHOL 130 04/27/2019 0840   TRIG 57 11/25/2020 0258   HDL 50 11/25/2020 0258   HDL 63 04/27/2019 0840   CHOLHDL 2.5 11/25/2020 0258   VLDL 11 11/25/2020 0258   LDLCALC 66 11/25/2020 0258   LDLCALC 53 04/27/2019 0840     Risk Assessment/Calculations:    CHA2DS2-VASc Score = 6   This indicates a 9.7% annual risk of stroke. The patient's score is based upon: CHF History: 0 HTN History: 0 Diabetes History: 0 Stroke History: 2 Vascular Disease History: 1 Age Score: 2 Gender Score: 0           Physical Exam:    VS:  BP 116/60   Pulse (!) 52   Ht 6' (1.829 m)   Wt 182 lb (82.6 kg)   SpO2 96%   BMI 24.68 kg/m     Wt Readings from Last 3 Encounters:  09/23/23 182 lb (82.6 kg)  08/08/23 179 lb (81.2 kg)  07/24/23 189 lb (  85.7 kg)     GEN:  Well nourished, well developed elderly male in no acute distress HEENT: Normal NECK: No JVD; No carotid bruits LYMPHATICS: No lymphadenopathy CARDIAC: RRR, 4/6 holosystolic murmur at the apex RESPIRATORY:  Clear to auscultation without rales, wheezing or rhonchi  ABDOMEN: Soft, non-tender, non-distended MUSCULOSKELETAL:  No edema; No deformity  SKIN: Warm and dry NEUROLOGIC:  Alert and oriented x 3 PSYCHIATRIC:  Normal affect   Assessment &  Plan Severe mitral regurgitation The patient has developed severe recurrent mitral regurgitation following remote mitral valve repair with quadrangular resection of a flail posterior mitral leaflet and ring annuloplasty using a 28 mm Sorin mitral annuloplasty ring.  He has developed NYHA functional class III symptoms of fatigue and exertional dyspnea as well as recent hospitalization with acute heart failure and volume overload.  The patient's echo images are personally reviewed and demonstrate severe eccentric mitral regurgitation through his mitral annuloplasty ring with a moderately elevated transvalvular gradient.  We discussed potential treatment options.  The patient would likely not be a candidate for redo cardiac surgery at age 75.  Interventional options might include transcatheter edge-to-edge repair versus valve in ring TMVR.  We discussed further evaluation and the patient would like to proceed.  Recommend that we proceed with right and left heart catheterization to assess hemodynamics as well as native coronary and graft patency. I have reviewed the risks, indications, and alternatives to cardiac catheterization, possible angioplasty, and stenting with the patient. Risks include but are not limited to bleeding, infection, vascular injury, stroke, myocardial infection, arrhythmia, kidney injury, radiation-related injury in the case of prolonged fluoroscopy use, emergency cardiac surgery, and death. The patient understands the risks of serious complication is 1-2 in 1000 with diagnostic cardiac cath and 1-2% or less with angioplasty/stenting.  He will also undergo transesophageal echo to assess the mechanism of mitral regurgitation.  His surface echo suggest that his MR is through the ring and the there is no evidence of dehiscence or paravalvular regurgitation.  Finally, he will undergo a gated cardiac CT to evaluate feasibility of transcatheter mitral valve and ring replacement and evaluate for risk  of LVOT obstruction. 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  Mean gradient is 10 mmHg.       Informed Consent   Shared Decision Making/Informed Consent   The risks [esophageal damage, perforation (1:10,000 risk), bleeding, pharyngeal hematoma as well as other potential complications associated with conscious sedation including aspiration, arrhythmia, respiratory failure and death], benefits (treatment guidance and diagnostic support) and alternatives of a transesophageal echocardiogram were discussed in detail with Mr. Gilbreth and he is willing to proceed.        Medication Adjustments/Labs and Tests Ordered: Current medicines are reviewed at length with the patient today.  Concerns regarding medicines are outlined above.  Orders Placed This Encounter  Procedures   CT ANGIO CHEST AORTA W/ & OR WO/CM & GATING ([place] ONLY)   CBC   Basic metabolic panel with GFR   EKG [id number]   No orders of the defined types were placed in this encounter.   Patient Instructions  Lab Work: CBC, BMET this week If you have labs (blood work) drawn today and your tests are completely normal, you will receive your results only by: MyChart Message (if you have MyChart) OR A paper copy in the mail If you have  any lab test that is abnormal or we need to change your treatment, we will call you to review the results.  Testing/Procedures: Transesophageal Echocardiogram Your physician has requested that you have a TEE. During a TEE, sound waves are used to create images of your heart. It provides your doctor with information about the size and shape of your heart and how well your heart's chambers and valves are working. In this test, a transducer is attached to the end of a flexible tube that's guided down your throat and into your esophagus (the  tube leading from you mouth to your stomach) to get a more detailed image of your heart. You are not awake for the procedure. Please see the instruction sheet given to you today. For further information please visit [email or web address]  R & L Heart Catheterization Your physician has requested that you have a cardiac catheterization. Cardiac catheterization is used to diagnose and/or treat various heart conditions. Doctors may recommend this procedure for a number of different reasons. The most common reason is to evaluate chest pain. Chest pain can be a symptom of coronary artery disease (CAD), and cardiac catheterization can show whether plaque is narrowing or blocking your heart's arteries. This procedure is also used to evaluate the valves, as well as measure the blood flow and oxygen levels in different parts of your heart. For further information please visit [email or web address] Please follow instruction sheet, as given.  Gated Coronary CT Angiogram Non-Cardiac CT Angiography (CTA), is a special type of CT scan that uses a computer to produce multi-dimensional views of major blood vessels throughout the body. In CT angiography, a contrast material is injected through an IV to help visualize the blood vessels  Follow-Up: At [facility], you and your health needs are our priority.  As part of our continuing mission to provide you with exceptional heart care, our providers are all part of one team.  This team includes your primary Cardiologist (physician) and Advanced Practice Providers or APPs (Physician Assistants and Nurse Practitioners) who all work together to provide you with the care you need, when you need it.  Your next appointment:   Structural Team will follow-up  Provider:   Arnoldo Lapping, MD      Other Instructions    You are scheduled for a TEE (Transesophageal Echocardiogram) on Friday, May 16 with Dr. Paulita Boss.  Please arrive at the [facility] (Main Entrance  A) at [facility]: [address] [place], [place] [address] at (This time is ONE hour(s) before your procedure to ensure your preparation).   Free valet parking service is available. You will check in at ADMITTING.   *Please Note: You will receive a call the day before your procedure to confirm the appointment time. That time may have changed from the original time based on the schedule for that day.*   DIET:  Nothing to eat or drink after midnight except a sip of water with medications (see medication instructions below)  MEDICATION INSTRUCTIONS: !!IF ANY NEW MEDICATIONS ARE STARTED AFTER TODAY, PLEASE NOTIFY YOUR PROVIDER AS SOON AS POSSIBLE!!  FYI: Medications such as Semaglutide (Ozempic, [place]), Tirzepatide (Mounjaro, Zepbound), Dulaglutide (Trulicity), etc ("GLP1 agonists") AND Canagliflozin (Invokana), Dapagliflozin (Farxiga), Empagliflozin (Jardiance), Ertugliflozin (Steglatro), Bexagliflozin [employer]) or any combination with one of these drugs such as Invokamet (Canagliflozin/Metformin), Synjardy (Empagliflozin/Metformin), etc ("SGLT2 inhibitors") must be held around the time of a procedure. This is not a comprehensive list of all of these drugs. Please review all of your medications  and talk to your provider if you take any one of these. If you are not sure, ask your provider.     Cardiac/Peripheral Catheterization   You are scheduled for a Cardiac Catheterization on Friday, May 16 with Dr. Arnoldo Lapping.  1. Please arrive at the [facility] (Main Entrance A) at [facility]: [address] [place], [place] [address] one hour prior to TEE procedure.  Free valet parking service is available. You will check in at ADMITTING. The support person will be asked to wait in the waiting room.  It is OK to have someone drop you off and come back when you are ready to be discharged.        Special note: Every effort is made to have your procedure done on time. Please  understand that emergencies sometimes delay scheduled procedures.  2. Diet: Do not eat solid foods after midnight.  You may have clear liquids until 5 AM the day of the procedure.  3. Labs: CBC, BMET as above (within a week)  4. Medication instructions in preparation for your procedure:   Contrast Allergy: No  DO NOT TAKE Eliquis  for 2 days prior to procedure (last dose Tuesday 10/01/23, resume Saturday 5/17) DO NOT TAKE Furosemide  or Spironolactone  the morning of procedure On the morning of your procedure, take Aspirin  81 mg and any morning medicines NOT listed above.  You may use sips of water.  5. Plan to go home the same day, you will only stay overnight if medically necessary. 6. You MUST have a responsible adult to drive you home. 7. An adult MUST be with you the first 24 hours after you arrive home. 8. Bring a current list of your medications, and the last time and date medication taken. 9. Bring ID and current insurance cards. 10.Please wear clothes that are easy to get on and off and wear slip-on shoes.  Thank you for allowing us  to care for you!   -- [facility]         Signed, Arnoldo Lapping, MD  10/01/2023 2:41 AM    [facility]

## 2023-09-22 NOTE — Progress Notes (Signed)
 Cardiology Office Note:    Date:  10/01/2023   ID:  Eric [name], DOB 06-23-1929, MRN [id number]  PCP:  Eric Gander, MD   [facility] Providers Cardiologist:  Eric Perl, MD     Referring MD: Eric Gander, MD   Chief Complaint  Patient presents with   Shortness of Breath    History of Present Illness:    Eric [name] is a 89 y.o. male referred by Dr Berry Bristol for evaluation of severe mitral regurgitation.  He is here with his family members today to discuss treatment options for mitral regurgitation.  The patient was in his normal state of health until February 2025 when he was hospitalized with atrial fibrillation with RVR and heart failure.  He required IV diuresis.  He had previously done quite well since his TAVR procedure in 2020.  The patient presented with weight gain, leg swelling, and progressive dyspnea.  He did not have any chest pain or pressure.  He now his better compensated on his medical regimen and complains only of fatigue and exertional dyspnea with a moderate level activity.  He has to stop and catch his breath with walking.  No further edema, orthopnea, or PND.  He underwent single vessel CABG SVG-OM, bioprosthetic AVR with a 23 mm Mitroflow valve, and mitral valve repair with a 28 mm Sorin MenoIII-V mitral annuloplasty ring in 2009. He later underwent valve in valve TAVR in 2020 with a 23 mm Edwards S3 valve.  The patient was hospitalized in February 2025 with acute on chronic HFpEF.  He was diuresed with symptomatic improvement.  There was concern about his loud heart murmur and suspicion for mitral regurgitation, but it was difficult to evaluate in the setting of atrial fibrillation with RVR.  A follow-up echocardiogram was recently done and demonstrates severe mitral regurgitation with a mean transmitral gradient of 8 mmHg at an average heart rate of 68 bpm.  LVEF is 55 to 60%.  Current Medications: Current Meds  Medication Sig   amoxicillin  (AMOXIL )  500 MG tablet Take 4 tablets (2,000 mg total) by mouth as directed. Take 1 hour prior to dental work, including cleanings.   atorvastatin  (LIPITOR) 20 MG tablet Take 40 mg by mouth every evening.   diltiazem  (CARDIZEM ) 30 MG tablet Take 1 tablet (30 mg total) by mouth 3 (three) times daily.   ELIQUIS  5 MG TABS tablet TAKE 1 TABLET BY MOUTH TWICE  DAILY   Eyelid Cleansers (OCUSOFT EYELID CLEANSING) PADS Place 1 application into the left eye in the morning.   ezetimibe  (ZETIA ) 10 MG tablet TAKE 1 TABLET BY MOUTH IN THE  EVENING   furosemide  (LASIX ) 20 MG tablet Take 1 tablet (20 mg total) by mouth daily.   losartan  (COZAAR ) 25 MG tablet Take 1 tablet (25 mg total) by mouth every evening. (Patient taking differently: Take 12.5 mg by mouth every evening.)   metoprolol  tartrate (LOPRESSOR ) 25 MG tablet Take 3 tablets (75 mg total) by mouth 2 (two) times daily.   spironolactone  (ALDACTONE ) 25 MG tablet Take 1 tablet (25 mg total) by mouth every morning. (Patient taking differently: Take 12.5 mg by mouth daily.)     Allergies:   Patient has no known allergies.   ROS:   Please see the history of present illness.    All other systems reviewed and are negative.  EKGs/Labs/Other Studies Reviewed:    The following studies were reviewed today: Cardiac Studies & Procedures   ______________________________________________________________________________________________ CARDIAC CATHETERIZATION  CARDIAC CATHETERIZATION 10/29/2018  Conclusion 1.  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  Single-vessel coronary artery disease with severe stenosis of the proximal left circumflex and continued patency of the saphenous vein graft to first OM 3.  Patent left main, LAD, and RCA with mild diffuse nonobstructive disease 4.  Normal right heart pressures and preserved  cardiac output  Recommend: Continued multidisciplinary team evaluation with CTA studies and formal surgical consultation with Dr. Sherene [name] in anticipation of possible valve-in-valve TAVR  Findings Coronary Findings Diagnostic  Dominance: Right  Left Main Mid LM to Dist LM lesion is 25% stenosed. The lesion is mildly calcified.  Left Anterior Descending Vessel is large. The vessel exhibits minimal luminal irregularities. The LAD is a large vessel that courses to the LV apex.  The vessel has minor irregularities but no significant stenoses.  The first diagonal is large in caliber.  Left Circumflex Prox Cx lesion is 75% stenosed.  Right Coronary Artery There is mild diffuse disease throughout the vessel. Mid RCA lesion is 40% stenosed. The lesion is moderately calcified.  Saphenous Graft To 1st Mrg SVG graft was visualized by angiography and is normal in caliber. The graft exhibits no disease. The saphenous vein graft to first OM is widely patent throughout.  There are no significant stenoses.  The graft fills the circumflex and retrograde fashion.  Intervention  No interventions have been documented.   STRESS TESTS  MYOCARDIAL PERFUSION IMAGING 10/26/2022  Narrative   The study is normal. Findings are consistent with no ischemia and no infarction. The study is high risk.   No ST deviation was noted.   LV perfusion is normal. There is no evidence of ischemia. There is no evidence of infarction.   Left ventricular function is normal. Nuclear stress EF: 57 %. The left ventricular ejection fraction is normal (55-65%). End diastolic cavity size is normal. End systolic cavity size is normal.   Prior study available for comparison from 11/02/2010.   ECHOCARDIOGRAM  ECHOCARDIOGRAM COMPLETE 09/02/2023  Narrative ECHOCARDIOGRAM REPORT    Patient Name:   Eric [name] Date of Exam: 09/02/2023 Medical Rec #:  [id number]       Height:       72.0 in Accession #:    [id number]       Weight:       179.0 lb Date of Birth:  May 16, 1930       BSA:          2.032 m Patient Age:    89 years        BP:           136/72 mmHg Patient Gender: M               HR:           55 bpm. Exam Location:  [address]  Procedure: 2D Echo, Cardiac Doppler, Color Doppler and 3D Echo (Both Spectral and Color Flow Doppler were utilized during procedure).  Indications:    Mitral Valve Disorder I05.9  History:        Patient has prior history of Echocardiogram examinations, most recent 07/01/2023. Arrythmias:Atrial Flutter and Atrial Fibrillation. Aortic Valve: 23 mm Sapien prosthetic, stented (TAVR) valve is present in the aortic position. Procedure Date: 11/25/2018. Mitral Valve: prosthetic annuloplasty ring valve is present in the mitral position. Procedure Date: 06/19/2007.  Sonographer:    Gabriel John  Alecia Ames RDCS Referring Phys: [id number] Eric [name]  IMPRESSIONS   1. S/p MV repair. Severe MR is now present with eccentric anteriorly directed jet. The mitral valve has been repaired/replaced. Severe mitral valve regurgitation. Mild mitral stenosis. The mean mitral valve gradient is 8.0 mmHg with average heart rate of 68 bpm. 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 Procedure Date: 06/19/2007. 2. Left ventricular ejection fraction, by estimation, is 55 to 60%. The left ventricle has normal function. The left ventricle has no regional wall motion abnormalities. There is mild concentric left ventricular hypertrophy. Left ventricular diastolic function could not be evaluated. 3. Right ventricular systolic function is moderately reduced. The right ventricular size is moderately enlarged. There is mildly elevated pulmonary artery systolic pressure. The estimated right ventricular systolic pressure is 44.5 mmHg. 4. Left atrial size was severely dilated. 5. Right atrial size was severely dilated. 6. Tricuspid valve regurgitation is moderate. 7. Trivial PVL. The aortic  valve has been repaired/replaced. Aortic valve regurgitation is trivial. There is a 23 mm Sapien prosthetic (TAVR) valve present in the aortic position. Procedure Date: 11/25/2018. Echo findings are consistent with normal structure and function of the aortic valve prosthesis. Aortic valve area, by VTI measures 2.32 cm. Aortic valve mean gradient measures 10.0 mmHg. Aortic valve Vmax measures 2.14 m/s. 8. Aortic dilatation noted. There is mild dilatation of the aortic root, measuring 40 mm. There is mild dilatation of the ascending aorta, measuring 43 mm. 9. The inferior vena cava is normal in size with greater than 50% respiratory variability, suggesting right atrial pressure of 3 mmHg.  Comparison(s): Changes from prior study are noted. Severe MR is now present.  FINDINGS Left Ventricle: Left ventricular ejection fraction, by estimation, is 55 to 60%. The left ventricle has normal function. The left ventricle has no regional wall motion abnormalities. 3D ejection fraction reviewed and evaluated as part of the interpretation. Alternate measurement of EF is felt to be most reflective of LV function. The left ventricular internal cavity size was normal in size. There is mild concentric left ventricular hypertrophy. Abnormal (paradoxical) septal motion consistent with post-operative status. Left ventricular diastolic function could not be evaluated due to atrial fibrillation. Left ventricular diastolic function could not be evaluated.  Right Ventricle: The right ventricular size is moderately enlarged. No increase in right ventricular wall thickness. Right ventricular systolic function is moderately reduced. There is mildly elevated pulmonary artery systolic pressure. The tricuspid regurgitant velocity is 3.22 m/s, and with an assumed right atrial pressure of 3 mmHg, the estimated right ventricular systolic pressure is 44.5 mmHg.  Left Atrium: Left atrial size was severely dilated.  Right Atrium:  Right atrial size was severely dilated.  Pericardium: There is no evidence of pericardial effusion.  Mitral Valve: S/p MV repair. Severe MR is now present with eccentric anteriorly directed jet. The mitral valve has been repaired/replaced. Severe mitral valve regurgitation. 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 Procedure Date: 06/19/2007. Mild mitral valve stenosis. MV peak gradient, 25.0 mmHg. The mean mitral valve gradient is 8.0 mmHg with average heart rate of 68 bpm.  Tricuspid Valve: The tricuspid valve is grossly normal. Tricuspid valve regurgitation is moderate . No evidence of tricuspid stenosis.  Aortic Valve: Trivial PVL. The aortic valve has been repaired/replaced. Aortic valve regurgitation is trivial. Aortic valve mean gradient measures 10.0 mmHg. Aortic valve peak gradient measures 18.3 mmHg. Aortic valve area, by VTI measures 2.32 cm. There is a 23 mm Sapien  prosthetic, stented (TAVR) valve present in the aortic position. Procedure Date: 11/25/2018. Echo findings are consistent with normal structure and function of the aortic valve prosthesis.  Pulmonic Valve: The pulmonic valve was grossly normal. Pulmonic valve regurgitation is trivial. No evidence of pulmonic stenosis.  Aorta: Aortic dilatation noted. There is mild dilatation of the aortic root, measuring 40 mm. There is mild dilatation of the ascending aorta, measuring 43 mm.  Venous: The inferior vena cava is normal in size with greater than 50% respiratory variability, suggesting right atrial pressure of 3 mmHg.  IAS/Shunts: The atrial septum is grossly normal.   LEFT VENTRICLE PLAX 2D LVIDd:         4.10 cm LVIDs:         2.60 cm LV PW:         1.20 cm LV IVS:        1.20 cm LVOT diam:     2.10 cm   3D Volume EF: LV SV:         107       3D EF:        63 % LV SV Index:   53        LV EDV:       200 ml LVOT Area:     3.46 cm  LV ESV:       74 ml LV SV:        126 ml  RIGHT  VENTRICLE RV Basal diam:  4.40 cm RV Mid diam:    3.20 cm RV S prime:     7.62 cm/s TAPSE (M-mode): 2.4 cm  LEFT ATRIUM              Index        RIGHT ATRIUM           Index LA diam:        3.85 cm  1.89 cm/m   RA Area:     31.70 cm LA Vol (A2C):   100.0 ml 49.20 ml/m  RA Volume:   113.00 ml 55.60 ml/m LA Vol (A4C):   94.3 ml  46.40 ml/m LA Biplane Vol: 99.1 ml  48.76 ml/m AORTIC VALVE AV Area (Vmax):    2.14 cm AV Area (Vmean):   2.00 cm AV Area (VTI):     2.32 cm AV Vmax:           214.00 cm/s AV Vmean:          144.000 cm/s AV VTI:            0.463 m AV Peak Grad:      18.3 mmHg AV Mean Grad:      10.0 mmHg LVOT Vmax:         132.00 cm/s LVOT Vmean:        83.200 cm/s LVOT VTI:          0.310 m LVOT/AV VTI ratio: 0.67  AORTA Ao Root diam: 4.00 cm Ao Asc diam:  4.30 cm  MITRAL VALVE                  TRICUSPID VALVE MV Area (PHT): 2.89 cm       TR Peak grad:   41.5 mmHg MV Peak grad:  25.0 mmHg      TR Vmax:        322.00 cm/s MV Mean grad:  8.0 mmHg MV Vmax:       2.50 m/s  SHUNTS MV Vmean:      119.0 cm/s     Systemic VTI:  0.31 m MR Peak grad:    103.6 mmHg   Systemic Diam: 2.10 cm MR Mean grad:    58.0 mmHg MR Vmax:         509.00 cm/s MR Vmean:        351.0 cm/s MR PISA:         6.28 cm MR PISA Eff ROA: 45 mm MR PISA Radius:  1.00 cm  Jackquelyn Mass MD Electronically signed by Jackquelyn Mass MD Signature Date/Time: 09/02/2023/5:04:47 PM    Final   TEE  ECHO TEE 10/22/2018  Narrative TRANSESOPHOGEAL ECHO REPORT    Patient Name:   GEZA HARGAN Date of Exam: 10/22/2018 Medical Rec #:  [id number]       Height:       72.0 in Accession #:    [id number]      Weight:       187.0 lb Date of Birth:  [birth year]       BSA:          2.07 m Patient Age:    88 years        BP:           126/49 mmHg Patient Gender: M               HR:           74 bpm. Exam Location:  Inpatient   Procedure: Transesophageal Echo, Color Doppler, Cardiac Doppler  and 3D Echo  Indications:     I35.1 Nonrheumatic aortic (valve) insufficiency  History:         Patient has prior history of Echocardiogram examinations. Prior CABG Signs/Symptoms: Shortness of Breath. Aortic Valve: A 28 Mitralflow porcine aortic valve bioprosthesis  Sonographer:     Konnie Perry RDCS Referring Phys:  [id number] Lucendia Rusk Diagnosing Phys: Dorothye Gathers MD    PROCEDURE: Local oropharyngeal anesthetic was provided with viscous lidocaine . The transesophogeal probe was passed through the esophogus of the patient. The patient developed no complications during the procedure.  IMPRESSIONS   1. The left ventricle has low normal systolic function, with an ejection fraction of 50-55%. Left ventricular diastolic function could not be evaluated. No evidence of left ventricular regional wall motion abnormalities. 2. The right ventricle has normal systolc function. The cavity was normal. There is no increase in right ventricular wall thickness. 3. Left atrial size was mildly dilated. 4. Right atrial size was mildly dilated. 5. Moderately thickened tricuspid valve leaflets. 6. 28mm Sorin mitral valve repair annular ring intact. 7. 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 8. A 28 Mitralflow porcine bioprosthesis valve is present in the aortic position. Echo findings are consistent with intravalvular regurgitation of the aortic prosthesis. 9. Aortic valve regurgitation is severe by color flow Doppler. Mild stenosis of the aortic valve. 10. No vegetation on the aortic valve. 11. The aortic root is normal in size and structure. 12. Previously ligated left atrial appendage. 13. Severe bioprosthetic aortic valve regurgitation.  FINDINGS Left Ventricle: The left ventricle has low normal systolic function, with an ejection fraction of 50-55%. There is no increase in left ventricular wall thickness. Left ventricular diastolic function could  not be evaluated. No evidence of left ventricular regional wall motion abnormalities.  Right Ventricle: The right ventricle has normal systolic function. The cavity was normal. There is no increase in right ventricular wall thickness.  Left  Atrium: Left atrial size was mildly dilated.   Right Atrium: Right atrial size was mildly dilated. Right atrial pressure is estimated at 10 mmHg.  Interatrial Septum: No atrial level shunt detected by color flow Doppler.  Pericardium: There is no evidence of pericardial effusion.  Mitral Valve: The mitral valve has been repaired/replaced. Mitral valve regurgitation is mild by color flow Doppler. 28mm Sorin mitral valve repair annular ring intact.  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 Tricuspid valve regurgitation is moderate-severe by color flow Doppler. The tricuspid valve is moderately thickened.  Aortic Valve: The aortic valve has been repaired/replaced Aortic valve regurgitation is severe by color flow Doppler. There is Mild stenosis of the aortic valve. A 28 Mitralflow porcine aortic valve bioprosthesis valve is present in the aortic position. Echo findings are consistent with intravalvular regurgitation of the aortic prosthesis. There is no evidence of a vegetation on the aortic valve.  Pulmonic Valve: The pulmonic valve was grossly normal. Pulmonic valve regurgitation is not visualized by color flow Doppler.  Aorta: The aortic root is normal in size and structure.  Additional Findings: Severe bioprosthetic aortic valve regurgitation. Previously ligated left atrial appendage.   +-------------+------------++ AORTIC VALVE              +-------------+------------++ AV Vmax:     260.00 cm/s  +-------------+------------++ AV Vmean:    181.000 cm/s +-------------+------------++ AV VTI:      0.569 m      +-------------+------------++ AV Peak Grad:27.0 mmHg    +-------------+------------++ AV Mean  Grad:15.0 mmHg    +-------------+------------++ AR PHT:      303 msec     +-------------+------------++  +-------------+---------++ +---------------+-----------++ MITRAL VALVE           TRICUSPID VALVE            +-------------+---------++ +---------------+-----------++ MV Peak grad:8.4 mmHg  TR Peak grad:  26.6 mmHg   +-------------+---------++ +---------------+-----------++ MV Mean grad:3.0 mmHg  TR Vmax:       258.00 cm/s +-------------+---------++ +---------------+-----------++ MV Vmax:     1.45 m/s  +-------------+---------++ MV Vmean:    66.7 cm/s +-------------+---------++ MV VTI:      0.24 m    +-------------+---------++   Dorothye Gathers MD Electronically signed by Dorothye Gathers MD Signature Date/Time: 10/22/2018/2:41:47 PM    Final    CT SCANS  CT CORONARY MORPH W/CTA COR W/SCORE 11/03/2018  Addendum 11/03/2018  5:32 PM ADDENDUM REPORT: 11/03/2018 17:29  EXAM: OVER-READ INTERPRETATION  CT CHEST  The following report is an over-read performed by radiologist Dr. Selwyn Dalton [facility] on 11/03/2018. This over-read does not include interpretation of cardiac or coronary anatomy or pathology. The cardiac CTA interpretation by the cardiologist is attached.  COMPARISON:  11/11/2006 chest CT angiogram.  FINDINGS: Please see the separate concurrent chest CT angiogram report for details.  IMPRESSION: Please see the separate concurrent chest CT angiogram report for details.   Electronically Signed By: Levell Reach M.D. On: 11/03/2018 17:29  Narrative CLINICAL DATA:  AVR Pre Valve in Valve TAVR  EXAM: Cardiac TAVR CT  TECHNIQUE: The patient was scanned on a Siemens Force 192 slice scanner. A 120 kV retrospective scan was triggered in the ascending thoracic aorta at 140 HU's. Gantry rotation speed was 250 msecs and collimation was .6 mm. No beta blockade or nitro were given. The 3D data set  was reconstructed in 5% intervals of the R-R cycle. Systolic and diastolic phases were analyzed on a dedicated work station using MPR, MIP  and VRT modes. The patient received 80 cc of contrast.  FINDINGS: Aortic Valve: There is a 23 mm Mitroflow stented pericardial bioprosthetic in place. Sewing ring appears intact with no areas of dehiscence The leaflets are thickened and there appears to be prolapse of the right coronary cusp.  Aorta: Moderate calcific atherosclerosis No aneurysm. Normal arch vessels  Sino-tubular Junction: 29 mm  Ascending Thoracic Aorta: 36 mm  Aortic Arch: 29 mm  Descending Thoracic Aorta: 24 mm  Sinus of Valsalva Measurements:  Non-coronary: 29.4 mm  Right - coronary: 28 mm  Left -   coronary: 30.6 mm  Coronary Artery Height above Annulus:  Left Main: 10.6 mm above annulus  Right Coronary: 10.9 mm above annulus  Valve ID: 19.2 mm  Area inside sewing ring 334 mm2  There is a patent SVG to the OM branch. The patient has had a mitral valve repair with annuloplasty ring. The LAA was supposed to have been ligated but appears patent with no obvious thrombus  IMPRESSION: 1. 23 mm Mitroflow stented pericardial tissue valve with thickened leaflets and possible prolapse of right cusp.  2. On SA basal view LM is 7.3 mm from valve leaflet border and RCA is 7.4 mm away However there is concern for the coronary heights Even a 20 mm Sapein 3 is 14 mm in height  The RCA appears to rest behind the right leaflet with STJ calcium  and may be at higher risk of occlusion  3.  Normal aortic root 3.6 cm  4.  Post mitral valve repair with annuloplasty ring  5.  LAA is patent Surgical note indicates ligation  6.  Patent SVG to OM  Janelle Mediate  Electronically Signed: By: Janelle Mediate M.D. On: 11/03/2018 15:16     ______________________________________________________________________________________________      EKG:   EKG  Interpretation Date/Time:  Monday Sep 23 2023 17:04:35 EDT Ventricular Rate:  52 PR Interval:    QRS Duration:  94 QT Interval:  438 QTC Calculation: 407 R Axis:   30  Text Interpretation: Atrial fibrillation with slow ventricular response When compared with ECG of 24-Jul-2023 11:41, No significant change was found Confirmed by Arnoldo Lapping [phone]) on 10/01/2023 2:40:43 AM    Recent Labs: 06/29/2023: B Natriuretic Peptide 225.9 07/01/2023: ALT 29 07/02/2023: Magnesium  2.3 08/05/2023: NT-Pro BNP 966 09/27/2023: BUN 25; Creatinine, Ser 1.01; Hemoglobin 13.4; Platelets 142; Potassium 4.4; Sodium 137  Recent Lipid Panel    Component Value Date/Time   CHOL 127 11/25/2020 0258   CHOL 130 04/27/2019 0840   TRIG 57 11/25/2020 0258   HDL 50 11/25/2020 0258   HDL 63 04/27/2019 0840   CHOLHDL 2.5 11/25/2020 0258   VLDL 11 11/25/2020 0258   LDLCALC 66 11/25/2020 0258   LDLCALC 53 04/27/2019 0840     Risk Assessment/Calculations:    CHA2DS2-VASc Score = 6   This indicates a 9.7% annual risk of stroke. The patient's score is based upon: CHF History: 0 HTN History: 0 Diabetes History: 0 Stroke History: 2 Vascular Disease History: 1 Age Score: 2 Gender Score: 0           Physical Exam:    VS:  BP 116/60   Pulse (!) 52   Ht 6' (1.829 m)   Wt 182 lb (82.6 kg)   SpO2 96%   BMI 24.68 kg/m     Wt Readings from Last 3 Encounters:  09/23/23 182 lb (82.6 kg)  08/08/23 179 lb (81.2 kg)  07/24/23 189 lb (  85.7 kg)     GEN:  Well nourished, well developed elderly male in no acute distress HEENT: Normal NECK: No JVD; No carotid bruits LYMPHATICS: No lymphadenopathy CARDIAC: RRR, 4/6 holosystolic murmur at the apex RESPIRATORY:  Clear to auscultation without rales, wheezing or rhonchi  ABDOMEN: Soft, non-tender, non-distended MUSCULOSKELETAL:  No edema; No deformity  SKIN: Warm and dry NEUROLOGIC:  Alert and oriented x 3 PSYCHIATRIC:  Normal affect   Assessment &  Plan Severe mitral regurgitation The patient has developed severe recurrent mitral regurgitation following remote mitral valve repair with quadrangular resection of a flail posterior mitral leaflet and ring annuloplasty using a 28 mm Sorin mitral annuloplasty ring.  He has developed NYHA functional class III symptoms of fatigue and exertional dyspnea as well as recent hospitalization with acute heart failure and volume overload.  The patient's echo images are personally reviewed and demonstrate severe eccentric mitral regurgitation through his mitral annuloplasty ring with a moderately elevated transvalvular gradient.  We discussed potential treatment options.  The patient would likely not be a candidate for redo cardiac surgery at age 75.  Interventional options might include transcatheter edge-to-edge repair versus valve in ring TMVR.  We discussed further evaluation and the patient would like to proceed.  Recommend that we proceed with right and left heart catheterization to assess hemodynamics as well as native coronary and graft patency. I have reviewed the risks, indications, and alternatives to cardiac catheterization, possible angioplasty, and stenting with the patient. Risks include but are not limited to bleeding, infection, vascular injury, stroke, myocardial infection, arrhythmia, kidney injury, radiation-related injury in the case of prolonged fluoroscopy use, emergency cardiac surgery, and death. The patient understands the risks of serious complication is 1-2 in 1000 with diagnostic cardiac cath and 1-2% or less with angioplasty/stenting.  He will also undergo transesophageal echo to assess the mechanism of mitral regurgitation.  His surface echo suggest that his MR is through the ring and the there is no evidence of dehiscence or paravalvular regurgitation.  Finally, he will undergo a gated cardiac CT to evaluate feasibility of transcatheter mitral valve and ring replacement and evaluate for risk  of LVOT obstruction. 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  Mean gradient is 10 mmHg.       Informed Consent   Shared Decision Making/Informed Consent   The risks [esophageal damage, perforation (1:10,000 risk), bleeding, pharyngeal hematoma as well as other potential complications associated with conscious sedation including aspiration, arrhythmia, respiratory failure and death], benefits (treatment guidance and diagnostic support) and alternatives of a transesophageal echocardiogram were discussed in detail with Mr. Gilbreth and he is willing to proceed.        Medication Adjustments/Labs and Tests Ordered: Current medicines are reviewed at length with the patient today.  Concerns regarding medicines are outlined above.  Orders Placed This Encounter  Procedures   CT ANGIO CHEST AORTA W/ & OR WO/CM & GATING ([place] ONLY)   CBC   Basic metabolic panel with GFR   EKG [id number]   No orders of the defined types were placed in this encounter.   Patient Instructions  Lab Work: CBC, BMET this week If you have labs (blood work) drawn today and your tests are completely normal, you will receive your results only by: MyChart Message (if you have MyChart) OR A paper copy in the mail If you have  any lab test that is abnormal or we need to change your treatment, we will call you to review the results.  Testing/Procedures: Transesophageal Echocardiogram Your physician has requested that you have a TEE. During a TEE, sound waves are used to create images of your heart. It provides your doctor with information about the size and shape of your heart and how well your heart's chambers and valves are working. In this test, a transducer is attached to the end of a flexible tube that's guided down your throat and into your esophagus (the  tube leading from you mouth to your stomach) to get a more detailed image of your heart. You are not awake for the procedure. Please see the instruction sheet given to you today. For further information please visit [email or web address]  R & L Heart Catheterization Your physician has requested that you have a cardiac catheterization. Cardiac catheterization is used to diagnose and/or treat various heart conditions. Doctors may recommend this procedure for a number of different reasons. The most common reason is to evaluate chest pain. Chest pain can be a symptom of coronary artery disease (CAD), and cardiac catheterization can show whether plaque is narrowing or blocking your heart's arteries. This procedure is also used to evaluate the valves, as well as measure the blood flow and oxygen levels in different parts of your heart. For further information please visit [email or web address] Please follow instruction sheet, as given.  Gated Coronary CT Angiogram Non-Cardiac CT Angiography (CTA), is a special type of CT scan that uses a computer to produce multi-dimensional views of major blood vessels throughout the body. In CT angiography, a contrast material is injected through an IV to help visualize the blood vessels  Follow-Up: At [facility], you and your health needs are our priority.  As part of our continuing mission to provide you with exceptional heart care, our providers are all part of one team.  This team includes your primary Cardiologist (physician) and Advanced Practice Providers or APPs (Physician Assistants and Nurse Practitioners) who all work together to provide you with the care you need, when you need it.  Your next appointment:   Structural Team will follow-up  Provider:   Arnoldo Lapping, MD      Other Instructions    You are scheduled for a TEE (Transesophageal Echocardiogram) on Friday, May 16 with Dr. Paulita Boss.  Please arrive at the [facility] (Main Entrance  A) at [facility]: [address] [place], [place] [address] at (This time is ONE hour(s) before your procedure to ensure your preparation).   Free valet parking service is available. You will check in at ADMITTING.   *Please Note: You will receive a call the day before your procedure to confirm the appointment time. That time may have changed from the original time based on the schedule for that day.*   DIET:  Nothing to eat or drink after midnight except a sip of water with medications (see medication instructions below)  MEDICATION INSTRUCTIONS: !!IF ANY NEW MEDICATIONS ARE STARTED AFTER TODAY, PLEASE NOTIFY YOUR PROVIDER AS SOON AS POSSIBLE!!  FYI: Medications such as Semaglutide (Ozempic, [place]), Tirzepatide (Mounjaro, Zepbound), Dulaglutide (Trulicity), etc ("GLP1 agonists") AND Canagliflozin (Invokana), Dapagliflozin (Farxiga), Empagliflozin (Jardiance), Ertugliflozin (Steglatro), Bexagliflozin [employer]) or any combination with one of these drugs such as Invokamet (Canagliflozin/Metformin), Synjardy (Empagliflozin/Metformin), etc ("SGLT2 inhibitors") must be held around the time of a procedure. This is not a comprehensive list of all of these drugs. Please review all of your medications  and talk to your provider if you take any one of these. If you are not sure, ask your provider.     Cardiac/Peripheral Catheterization   You are scheduled for a Cardiac Catheterization on Friday, May 16 with Dr. Arnoldo Lapping.  1. Please arrive at the [facility] (Main Entrance A) at [facility]: [address] [place], [place] [address] one hour prior to TEE procedure.  Free valet parking service is available. You will check in at ADMITTING. The support person will be asked to wait in the waiting room.  It is OK to have someone drop you off and come back when you are ready to be discharged.        Special note: Every effort is made to have your procedure done on time. Please  understand that emergencies sometimes delay scheduled procedures.  2. Diet: Do not eat solid foods after midnight.  You may have clear liquids until 5 AM the day of the procedure.  3. Labs: CBC, BMET as above (within a week)  4. Medication instructions in preparation for your procedure:   Contrast Allergy: No  DO NOT TAKE Eliquis  for 2 days prior to procedure (last dose Tuesday 10/01/23, resume Saturday 5/17) DO NOT TAKE Furosemide  or Spironolactone  the morning of procedure On the morning of your procedure, take Aspirin  81 mg and any morning medicines NOT listed above.  You may use sips of water.  5. Plan to go home the same day, you will only stay overnight if medically necessary. 6. You MUST have a responsible adult to drive you home. 7. An adult MUST be with you the first 24 hours after you arrive home. 8. Bring a current list of your medications, and the last time and date medication taken. 9. Bring ID and current insurance cards. 10.Please wear clothes that are easy to get on and off and wear slip-on shoes.  Thank you for allowing us  to care for you!   -- [facility]         Signed, Arnoldo Lapping, MD  10/01/2023 2:41 AM    [facility]

## 2023-09-23 ENCOUNTER — Ambulatory Visit: Attending: Cardiovascular Disease | Admitting: Cardiovascular Disease

## 2023-09-23 ENCOUNTER — Encounter: Payer: Self-pay | Admitting: Cardiovascular Disease

## 2023-09-23 VITALS — BP 116/60 | HR 52 | Ht 72.0 in | Wt 182.0 lb

## 2023-09-23 DIAGNOSIS — Z952 Presence of prosthetic heart valve: Secondary | ICD-10-CM | POA: Diagnosis not present

## 2023-09-23 DIAGNOSIS — Z01810 Encounter for preprocedural cardiovascular examination: Secondary | ICD-10-CM | POA: Diagnosis not present

## 2023-09-23 DIAGNOSIS — I340 Nonrheumatic mitral (valve) insufficiency: Secondary | ICD-10-CM | POA: Diagnosis not present

## 2023-09-23 NOTE — Patient Instructions (Addendum)
 Lab Work: CBC, BMET this week If you have labs (blood work) drawn today and your tests are completely normal, you will receive your results only by: MyChart Message (if you have MyChart) OR A paper copy in the mail If you have any lab test that is abnormal or we need to change your treatment, we will call you to review the results.  Testing/Procedures: Transesophageal Echocardiogram Your physician has requested that you have a TEE. During a TEE, sound waves are used to create images of your heart. It provides your doctor with information about the size and shape of your heart and how well your heart's chambers and valves are working. In this test, a transducer is attached to the end of a flexible tube that's guided down your throat and into your esophagus (the tube leading from you mouth to your stomach) to get a more detailed image of your heart. You are not awake for the procedure. Please see the instruction sheet given to you today. For further information please visit [email or web address]  R & L Heart Catheterization Your physician has requested that you have a cardiac catheterization. Cardiac catheterization is used to diagnose and/or treat various heart conditions. Doctors may recommend this procedure for a number of different reasons. The most common reason is to evaluate chest pain. Chest pain can be a symptom of coronary artery disease (CAD), and cardiac catheterization can show whether plaque is narrowing or blocking your heart's arteries. This procedure is also used to evaluate the valves, as well as measure the blood flow and oxygen levels in different parts of your heart. For further information please visit [email or web address] Please follow instruction sheet, as given.  Gated Coronary CT Angiogram Non-Cardiac CT Angiography (CTA), is a special type of CT scan that uses a computer to produce multi-dimensional views of major blood vessels throughout the body. In CT angiography, a contrast  material is injected through an IV to help visualize the blood vessels  Follow-Up: At [facility], you and your health needs are our priority.  As part of our continuing mission to provide you with exceptional heart care, our providers are all part of one team.  This team includes your primary Cardiologist (physician) and Advanced Practice Providers or APPs (Physician Assistants and Nurse Practitioners) who all work together to provide you with the care you need, when you need it.  Your next appointment:   Structural Team will follow-up  Provider:   Arnoldo Lapping, MD      Other Instructions    You are scheduled for a TEE (Transesophageal Echocardiogram) on Friday, May 16 with Dr. Paulita Boss.  Please arrive at the [facility] (Main Entrance A) at [facility]: [address] [place], [place] [address] at (This time is ONE hour(s) before your procedure to ensure your preparation).   Free valet parking service is available. You will check in at ADMITTING.   *Please Note: You will receive a call the day before your procedure to confirm the appointment time. That time may have changed from the original time based on the schedule for that day.*   DIET:  Nothing to eat or drink after midnight except a sip of water with medications (see medication instructions below)  MEDICATION INSTRUCTIONS: !!IF ANY NEW MEDICATIONS ARE STARTED AFTER TODAY, PLEASE NOTIFY YOUR PROVIDER AS SOON AS POSSIBLE!!  FYI: Medications such as Semaglutide (Ozempic, [place]), Tirzepatide (Mounjaro, Zepbound), Dulaglutide (Trulicity), etc ("GLP1 agonists") AND Canagliflozin (Invokana), Dapagliflozin (Farxiga), Empagliflozin (Jardiance), Ertugliflozin (Steglatro), Bexagliflozin [employer]) or  any combination with one of these drugs such as Invokamet (Canagliflozin/Metformin), Synjardy (Empagliflozin/Metformin), etc ("SGLT2 inhibitors") must be held around the time of a procedure. This is not a comprehensive  list of all of these drugs. Please review all of your medications and talk to your provider if you take any one of these. If you are not sure, ask your provider.     Cardiac/Peripheral Catheterization   You are scheduled for a Cardiac Catheterization on Friday, May 16 with Dr. Arnoldo Lapping.  1. Please arrive at the [facility] (Main Entrance A) at [facility]: [address] [place], [place] [address] one hour prior to TEE procedure.  Free valet parking service is available. You will check in at ADMITTING. The support person will be asked to wait in the waiting room.  It is OK to have someone drop you off and come back when you are ready to be discharged.        Special note: Every effort is made to have your procedure done on time. Please understand that emergencies sometimes delay scheduled procedures.  2. Diet: Do not eat solid foods after midnight.  You may have clear liquids until 5 AM the day of the procedure.  3. Labs: CBC, BMET as above (within a week)  4. Medication instructions in preparation for your procedure:   Contrast Allergy: No  DO NOT TAKE Eliquis  for 2 days prior to procedure (last dose Tuesday 10/01/23, resume Saturday 5/17) DO NOT TAKE Furosemide  or Spironolactone  the morning of procedure On the morning of your procedure, take Aspirin  81 mg and any morning medicines NOT listed above.  You may use sips of water.  5. Plan to go home the same day, you will only stay overnight if medically necessary. 6. You MUST have a responsible adult to drive you home. 7. An adult MUST be with you the first 24 hours after you arrive home. 8. Bring a current list of your medications, and the last time and date medication taken. 9. Bring ID and current insurance cards. 10.Please wear clothes that are easy to get on and off and wear slip-on shoes.  Thank you for allowing us  to care for you!   -- [facility]

## 2023-09-27 LAB — BASIC METABOLIC PANEL WITH GFR
BUN/Creatinine Ratio: 25 — ABNORMAL HIGH (ref 10–24)
BUN: 25 mg/dL (ref 10–36)
CO2: 23 mmol/L (ref 20–29)
Calcium: 9.7 mg/dL (ref 8.6–10.2)
Chloride: 102 mmol/L (ref 96–106)
Creatinine, Ser: 1.01 mg/dL (ref 0.76–1.27)
Glucose: 100 mg/dL — ABNORMAL HIGH (ref 70–99)
Potassium: 4.4 mmol/L (ref 3.5–5.2)
Sodium: 137 mmol/L (ref 134–144)
eGFR: 70 mL/min/{1.73_m2} (ref >59)

## 2023-09-27 LAB — CBC
Hematocrit: 40.4 % (ref 37.5–51.0)
Hemoglobin: 13.4 g/dL (ref 13.0–17.7)
MCH: 31.5 pg (ref 26.6–33.0)
MCHC: 33.2 g/dL (ref 31.5–35.7)
MCV: 95 fL (ref 79–97)
Platelets: 142 10*3/uL — ABNORMAL LOW (ref 150–450)
RBC: 4.25 x10E6/uL (ref 4.14–5.80)
RDW: 12.3 % (ref 11.6–15.4)
WBC: 4.2 10*3/uL (ref 3.4–10.8)

## 2023-10-01 ENCOUNTER — Telehealth: Payer: Self-pay | Admitting: Cardiovascular Disease

## 2023-10-01 ENCOUNTER — Encounter: Payer: Self-pay | Admitting: Cardiovascular Disease

## 2023-10-01 ENCOUNTER — Telehealth: Payer: Self-pay | Admitting: Cardiology

## 2023-10-01 NOTE — Telephone Encounter (Signed)
 Spoke with Ms. Simonetti at length.  Informed her that CT instructions will be brought to her and the patient at the time of the heart catheterization on Friday. Reiterated to her that the patient is not having a TAVR as discussed earlier today. She was grateful for call and agreed with plan.

## 2023-10-01 NOTE — Telephone Encounter (Signed)
 Error

## 2023-10-01 NOTE — Telephone Encounter (Signed)
 Spoke with wife and she states she was not called to have CT scheduled. She states she was told it needed to be done before he can have his cath and TAVR. She states Cath is Friday and she don't want it to be cancelled because they never received a call to get scheduled

## 2023-10-01 NOTE — Telephone Encounter (Signed)
 Wife is calling in regards to getting patient ready for the test on Friday. Please advise

## 2023-10-03 ENCOUNTER — Telehealth: Payer: Self-pay | Admitting: *Deleted

## 2023-10-03 NOTE — Progress Notes (Signed)
 Message left on identified phone number of wife. Pt called for pre procedure instructions.  TEE and R/L heart cath tomorrow. Arrival time 0700-0730 NPO after midnight explained.Instructed to take AS 81 mg in am  Hold Eliquis  and lasix /spironolactone   Instructed pt need for ride home tomorrow and have responsible adult with them for 24 hrs post procedure.

## 2023-10-03 NOTE — Telephone Encounter (Signed)
 Cardiac Catheterization scheduled at [facility] for: Friday Oct 04, 2023 10:30 /TEE 8:30 AM Arrival time [facility] Main Entrance A at: 7:30 AM  Nothing to eat or drink after midnight prior to procedures.  Medication instructions: -Hold:  Eliquis -none 10/02/23 until post procedure  Lasix /Spironolactone -AM of procedure -Other usual morning medications can be taken with sips of water including aspirin  81 mg.  Plan to go home the same day, you will only stay overnight if medically necessary.  You must have responsible adult to drive you home.  Someone must be with you the first 24 hours after you arrive home.  Reviewed procedure instructions with patient's wife (DPR).

## 2023-10-04 ENCOUNTER — Ambulatory Visit (HOSPITAL_COMMUNITY)
Admission: RE | Admit: 2023-10-04 | Discharge: 2023-10-04 | Disposition: A | Discharge status: Home / Self Care | Attending: Internal Medicine | Admitting: Internal Medicine

## 2023-10-04 ENCOUNTER — Ambulatory Visit (HOSPITAL_COMMUNITY): Admitting: Anesthesiology

## 2023-10-04 ENCOUNTER — Other Ambulatory Visit: Payer: Self-pay

## 2023-10-04 ENCOUNTER — Encounter (HOSPITAL_COMMUNITY)
Admission: RE | Disposition: A | Discharge status: Home / Self Care | Payer: Self-pay | Source: Home / Self Care | Attending: Internal Medicine

## 2023-10-04 ENCOUNTER — Ambulatory Visit (HOSPITAL_COMMUNITY)
Admission: RE | Admit: 2023-10-04 | Discharge: 2023-10-04 | Disposition: A | Discharge status: Home / Self Care | Source: Ambulatory Visit | Attending: Internal Medicine | Admitting: Internal Medicine

## 2023-10-04 ENCOUNTER — Encounter (HOSPITAL_COMMUNITY): Payer: Self-pay | Admitting: Internal Medicine

## 2023-10-04 DIAGNOSIS — I110 Hypertensive heart disease with heart failure: Secondary | ICD-10-CM | POA: Insufficient documentation

## 2023-10-04 DIAGNOSIS — I340 Nonrheumatic mitral (valve) insufficiency: Secondary | ICD-10-CM

## 2023-10-04 DIAGNOSIS — Z951 Presence of aortocoronary bypass graft: Secondary | ICD-10-CM | POA: Diagnosis not present

## 2023-10-04 DIAGNOSIS — I2510 Atherosclerotic heart disease of native coronary artery without angina pectoris: Secondary | ICD-10-CM | POA: Insufficient documentation

## 2023-10-04 DIAGNOSIS — I083 Combined rheumatic disorders of mitral, aortic and tricuspid valves: Secondary | ICD-10-CM | POA: Insufficient documentation

## 2023-10-04 DIAGNOSIS — Z953 Presence of xenogenic heart valve: Secondary | ICD-10-CM | POA: Insufficient documentation

## 2023-10-04 DIAGNOSIS — Z8673 Personal history of transient ischemic attack (TIA), and cerebral infarction without residual deficits: Secondary | ICD-10-CM | POA: Insufficient documentation

## 2023-10-04 DIAGNOSIS — Z952 Presence of prosthetic heart valve: Secondary | ICD-10-CM

## 2023-10-04 DIAGNOSIS — I5033 Acute on chronic diastolic (congestive) heart failure: Secondary | ICD-10-CM | POA: Diagnosis not present

## 2023-10-04 DIAGNOSIS — I509 Heart failure, unspecified: Secondary | ICD-10-CM | POA: Insufficient documentation

## 2023-10-04 DIAGNOSIS — Z9889 Other specified postprocedural states: Secondary | ICD-10-CM

## 2023-10-04 DIAGNOSIS — I4891 Unspecified atrial fibrillation: Secondary | ICD-10-CM | POA: Diagnosis not present

## 2023-10-04 HISTORY — PX: TRANSESOPHAGEAL ECHOCARDIOGRAM (CATH LAB): EP1270

## 2023-10-04 HISTORY — PX: RIGHT/LEFT HEART CATH AND CORONARY/GRAFT ANGIOGRAPHY: CATH118267

## 2023-10-04 LAB — POCT I-STAT EG7
Acid-base deficit: 3 mmol/L — ABNORMAL HIGH (ref 0.0–2.0)
Bicarbonate: 22.4 mmol/L (ref 20.0–28.0)
Calcium, Ion: 1.18 mmol/L (ref 1.15–1.40)
HCT: 36 % — ABNORMAL LOW (ref 39.0–52.0)
Hemoglobin: 12.2 g/dL — ABNORMAL LOW (ref 13.0–17.0)
O2 Saturation: 71 %
Potassium: 3.8 mmol/L (ref 3.5–5.1)
Sodium: 141 mmol/L (ref 135–145)
TCO2: 24 mmol/L (ref 22–32)
pCO2, Ven: 39.2 mmHg — ABNORMAL LOW (ref 44–60)
pH, Ven: 7.364 (ref 7.25–7.43)
pO2, Ven: 38 mmHg (ref 32–45)

## 2023-10-04 LAB — POCT I-STAT 7, (LYTES, BLD GAS, ICA,H+H)
Acid-base deficit: 3 mmol/L — ABNORMAL HIGH (ref 0.0–2.0)
Bicarbonate: 20.7 mmol/L (ref 20.0–28.0)
Calcium, Ion: 1.24 mmol/L (ref 1.15–1.40)
HCT: 37 % — ABNORMAL LOW (ref 39.0–52.0)
Hemoglobin: 12.6 g/dL — ABNORMAL LOW (ref 13.0–17.0)
O2 Saturation: 97 %
Potassium: 4 mmol/L (ref 3.5–5.1)
Sodium: 139 mmol/L (ref 135–145)
TCO2: 22 mmol/L (ref 22–32)
pCO2 arterial: 33.6 mmHg (ref 32–48)
pH, Arterial: 7.397 (ref 7.35–7.45)
pO2, Arterial: 90 mmHg (ref 83–108)

## 2023-10-04 LAB — ECHO TEE

## 2023-10-04 SURGERY — TRANSESOPHAGEAL ECHOCARDIOGRAM (TEE) (CATHLAB)
Anesthesia: Monitor Anesthesia Care

## 2023-10-04 SURGERY — RIGHT/LEFT HEART CATH AND CORONARY/GRAFT ANGIOGRAPHY
Anesthesia: LOCAL

## 2023-10-04 MED ORDER — SODIUM CHLORIDE 0.9 % WEIGHT BASED INFUSION
3.0000 mL/kg/h | INTRAVENOUS | Status: AC
  Administered 2023-10-04: 3 mL/kg/h via INTRAVENOUS

## 2023-10-04 MED ORDER — PROPOFOL 10 MG/ML IV BOLUS
INTRAVENOUS | Status: DC | PRN
Start: 2023-10-04 — End: 2023-10-04
  Administered 2023-10-04 (×5): 20 mg via INTRAVENOUS
  Administered 2023-10-04: 60 mg via INTRAVENOUS

## 2023-10-04 MED ORDER — ASPIRIN 81 MG PO CHEW: 81.0000 mg | CHEWABLE_TABLET | ORAL | Status: DC

## 2023-10-04 MED ORDER — LIDOCAINE HCL (PF) 1 % IJ SOLN
INTRAMUSCULAR | Status: AC
  Filled 2023-10-04: qty 30

## 2023-10-04 MED ORDER — LIDOCAINE HCL (PF) 1 % IJ SOLN
INTRAMUSCULAR | Status: DC | PRN
Start: 2023-10-04 — End: 2023-10-04
  Administered 2023-10-04 (×2): 5 mL

## 2023-10-04 MED ORDER — SODIUM CHLORIDE 0.9 % IV SOLN: INTRAVENOUS | Status: DC | PRN

## 2023-10-04 MED ORDER — IOHEXOL 350 MG/ML SOLN
INTRAVENOUS | Status: DC | PRN
  Administered 2023-10-04: 50 mL

## 2023-10-04 MED ORDER — HEPARIN SODIUM (PORCINE) 1000 UNIT/ML IJ SOLN
INTRAMUSCULAR | Status: AC
Start: 2023-10-04 — End: ?
  Filled 2023-10-04: qty 10

## 2023-10-04 MED ORDER — PHENYLEPHRINE HCL-NACL 20-0.9 MG/250ML-% IV SOLN
INTRAVENOUS | Status: DC | PRN
  Administered 2023-10-04: 30 ug/min via INTRAVENOUS

## 2023-10-04 MED ORDER — MIDAZOLAM HCL 2 MG/2ML IJ SOLN
INTRAMUSCULAR | Status: DC | PRN
  Administered 2023-10-04: 1 mg via INTRAVENOUS

## 2023-10-04 MED ORDER — HEPARIN SODIUM (PORCINE) 1000 UNIT/ML IJ SOLN
INTRAMUSCULAR | Status: DC | PRN
  Administered 2023-10-04: 4000 [IU] via INTRAVENOUS

## 2023-10-04 MED ORDER — LABETALOL HCL 5 MG/ML IV SOLN: 10.0000 mg | INTRAVENOUS | Status: DC | PRN

## 2023-10-04 MED ORDER — HEPARIN (PORCINE) IN NACL 1000-0.9 UT/500ML-% IV SOLN
INTRAVENOUS | Status: DC | PRN
  Administered 2023-10-04: 1000 mL

## 2023-10-04 MED ORDER — SODIUM CHLORIDE 0.9 % WEIGHT BASED INFUSION
1.0000 mL/kg/h | INTRAVENOUS | Status: DC
  Administered 2023-10-04: 1 mL/kg/h via INTRAVENOUS

## 2023-10-04 MED ORDER — SODIUM CHLORIDE 0.9 % IV SOLN
INTRAVENOUS | Status: DC | PRN
  Administered 2023-10-04: 10 mL/h via INTRAVENOUS

## 2023-10-04 MED ORDER — HYDRALAZINE HCL 20 MG/ML IJ SOLN: 10.0000 mg | INTRAMUSCULAR | Status: DC | PRN

## 2023-10-04 MED ORDER — ACETAMINOPHEN 325 MG PO TABS: 650.0000 mg | ORAL_TABLET | ORAL | Status: DC | PRN

## 2023-10-04 MED ORDER — MIDAZOLAM HCL 2 MG/2ML IJ SOLN
INTRAMUSCULAR | Status: AC
Start: 2023-10-04 — End: ?
  Filled 2023-10-04: qty 2

## 2023-10-04 MED ORDER — VERAPAMIL HCL 2.5 MG/ML IV SOLN
INTRAVENOUS | Status: DC | PRN
  Administered 2023-10-04: 8 mL via INTRA_ARTERIAL

## 2023-10-04 MED ORDER — LIDOCAINE 2% (20 MG/ML) 5 ML SYRINGE
INTRAMUSCULAR | Status: DC | PRN
  Administered 2023-10-04: 40 mg via INTRAVENOUS

## 2023-10-04 MED ORDER — SODIUM CHLORIDE 0.9 % IV SOLN: INTRAVENOUS | Status: DC

## 2023-10-04 MED ORDER — ONDANSETRON HCL 4 MG/2ML IJ SOLN
4.0000 mg | Freq: Four times a day (QID) | INTRAMUSCULAR | Status: DC | PRN
Start: 2023-10-04 — End: 2023-10-04

## 2023-10-04 MED ORDER — VERAPAMIL HCL 2.5 MG/ML IV SOLN
INTRAVENOUS | Status: AC
  Filled 2023-10-04: qty 2

## 2023-10-04 MED ORDER — FENTANYL CITRATE (PF) 100 MCG/2ML IJ SOLN
INTRAMUSCULAR | Status: AC
  Filled 2023-10-04: qty 2

## 2023-10-04 MED ORDER — FENTANYL CITRATE (PF) 100 MCG/2ML IJ SOLN
INTRAMUSCULAR | Status: DC | PRN
  Administered 2023-10-04: 25 ug via INTRAVENOUS

## 2023-10-04 SURGICAL SUPPLY — 12 items
CATH 5FR JL3.5 JR4 ANG PIG MP (CATHETERS) IMPLANT
CATH BALLN WEDGE 5F 110CM (CATHETERS) IMPLANT
CATH INFINITI 5 FR AL2 (CATHETERS) IMPLANT
CATH INFINITI 5FR AL1 (CATHETERS) IMPLANT
CATH INFINITI 5FR JL4 (CATHETERS) IMPLANT
DEVICE RAD COMP TR BAND LRG (VASCULAR PRODUCTS) IMPLANT
GLIDESHEATH SLEND SS 6F .021 (SHEATH) IMPLANT
GUIDEWIRE INQWIRE 1.5J.035X260 (WIRE) IMPLANT
GUIDEWIRE TIGER .035X300 (WIRE) IMPLANT
PACK CARDIAC CATHETERIZATION (CUSTOM PROCEDURE TRAY) ×2 IMPLANT
SET ATX-X65L (MISCELLANEOUS) IMPLANT
SHEATH GLIDE SLENDER 4/5FR (SHEATH) IMPLANT

## 2023-10-04 NOTE — CV Procedure (Signed)
    TRANSESOPHAGEAL ECHOCARDIOGRAM   NAME:  Eric [name]    MRN: [id number] DOB:  March 15, 1930    ADMIT DATE: 10/04/2023  INDICATIONS: Prosthetic mitral regurgitation  PROCEDURE:   Informed consent was obtained prior to the procedure. The risks, benefits and alternatives for the procedure were discussed and the patient comprehended these risks.  Risks include, but are not limited to, cough, sore throat, vomiting, nausea, somnolence, esophageal and stomach trauma or perforation, bleeding, low blood pressure, aspiration, pneumonia, infection, trauma to the teeth and death.    Procedural time out performed.   Anesthesia was administered by Dr. Wendy Hamel and team.   The patient's heart rate, blood pressure, and oxygen saturation are monitored continuously during the procedure.   The transesophageal probe was inserted in the esophagus and stomach without difficulty and multiple views were obtained.   COMPLICATIONS:    There were no immediate complications.  KEY FINDINGS:  Severe Mitral regurgitation- posterior restriction with a small flail segment.  There is paravalular leak around the mitral ring.  Full report to follow. Further management per primary team.   Gloriann Larger, MD [place]  [facility]  8:21 AM

## 2023-10-04 NOTE — Transfer of Care (Signed)
 Immediate Anesthesia Transfer of Care Note  Patient: Eric [name]  Procedure(s) Performed: TRANSESOPHAGEAL ECHOCARDIOGRAM  Patient Location: PACU  Anesthesia Type:MAC  Level of Consciousness: awake  Airway & Oxygen Therapy: Patient Spontanous Breathing  Post-op Assessment: Report given to RN  Post vital signs: Reviewed and stable  Last Vitals:  Vitals Value Taken Time  BP 126/70 10/04/23 0817  Temp    Pulse 43 10/04/23 0819  Resp 18 10/04/23 0819  SpO2 98 % 10/04/23 0819  Vitals shown include unfiled device data.  Last Pain:  Vitals:   10/04/23 0721  TempSrc: Temporal  PainSc: 0-No pain         Complications: No notable events documented.

## 2023-10-04 NOTE — Discharge Instructions (Signed)

## 2023-10-04 NOTE — Progress Notes (Signed)
 TR BAND REMOVAL  LOCATION:    right radial  DEFLATED PER PROTOCOL:    Yes.    TIME BAND OFF / DRESSING APPLIED:    1530 gauze dressing applied   SITE UPON ARRIVAL:    Level 0  SITE AFTER BAND REMOVAL:    Level 0  CIRCULATION SENSATION AND MOVEMENT:    Within Normal Limits   Yes.    COMMENTS:   no issues noted

## 2023-10-04 NOTE — Interval H&P Note (Signed)
 History and Physical Interval Note:  10/04/2023 10:15 AM  Eric [name]  has presented today for surgery, with the diagnosis of Mitral regurg.  The various methods of treatment have been discussed with the patient and family. After consideration of risks, benefits and other options for treatment, the patient has consented to  Procedure(s): RIGHT/LEFT HEART CATH AND CORONARY/GRAFT ANGIOGRAPHY (N/A) as a surgical intervention.  The patient's history has been reviewed, patient examined, no change in status, stable for surgery.  I have reviewed the patient's chart and labs.  Questions were answered to the patient's satisfaction.     Arnoldo Lapping

## 2023-10-04 NOTE — Interval H&P Note (Signed)
 History and Physical Interval Note:  10/04/2023 7:38 AM  Eric [name]  has presented today for surgery, with the diagnosis of mitral regurgitation.  The various methods of treatment have been discussed with the patient and family. After consideration of risks, benefits and other options for treatment, the patient has consented to  Procedure(s): TRANSESOPHAGEAL ECHOCARDIOGRAM (N/A) as a surgical intervention.  The patient's history has been reviewed, patient examined, no change in status, stable for surgery.  I have reviewed the patient's chart and labs.  Questions were answered to the patient's satisfaction.     Imane Burrough A Rayce Brahmbhatt

## 2023-10-04 NOTE — Anesthesia Postprocedure Evaluation (Signed)
 Anesthesia Post Note  Patient: Eric [name]  Procedure(s) Performed: TRANSESOPHAGEAL ECHOCARDIOGRAM     Patient location during evaluation: PACU Anesthesia Type: MAC Level of consciousness: awake and alert Pain management: pain level controlled Vital Signs Assessment: post-procedure vital signs reviewed and stable Respiratory status: spontaneous breathing, nonlabored ventilation and respiratory function stable Cardiovascular status: blood pressure returned to baseline and stable Postop Assessment: no apparent nausea or vomiting Anesthetic complications: no   No notable events documented.  Last Vitals:  Vitals:   10/04/23 0830 10/04/23 0840  BP: 103/63 (!) 102/58  Pulse: (!) 48 (!) 47  Resp: 17 14  Temp:  36.7 C  SpO2: 94% 94%    Last Pain:  Vitals:   10/04/23 0840  TempSrc: Temporal  PainSc: 0-No pain                 [name]

## 2023-10-04 NOTE — Anesthesia Preprocedure Evaluation (Addendum)
 Anesthesia Evaluation  Patient identified by MRN, date of birth, ID band Patient awake    Reviewed: Allergy & Precautions, H&P , NPO status , Patient's Chart, lab work & pertinent test results, reviewed documented beta blocker date and time   Airway Mallampati: III  TM Distance: >3 FB Neck ROM: Full    Dental  (+) Teeth Intact, Dental Advisory Given   Pulmonary neg pulmonary ROS   Pulmonary exam normal breath sounds clear to auscultation       Cardiovascular hypertension, Pt. on medications and Pt. on home beta blockers pulmonary hypertension (mild pHTN on echo)+ CAD and +CHF (normal LVEF on last echo, mod RV dysfunction)  Normal cardiovascular exam+ dysrhythmias (eliquis ) Atrial Fibrillation + Valvular Problems/Murmurs (severe MR, mild MS, mod TR) MR  Rhythm:Regular Rate:Normal  S/p MVR/CABG 2009, TAVR 2020  Last stress test 2024 normal  Last echo 2025 1. S/p MV repair. Severe MR is now present with eccentric anteriorly  directed jet. The mitral valve has been repaired/replaced. Severe mitral  valve regurgitation. Mild mitral stenosis. The mean mitral valve gradient  is 8.0 mmHg with average heart rate of 68 bpm. 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 Procedure Date: 06/19/2007.   2. Left ventricular ejection fraction, by estimation, is 55 to 60%. The  left ventricle has normal function. The left ventricle has no regional wall motion abnormalities. There is mild concentric left ventricular hypertrophy. Left ventricular diastolic function could not be evaluated.   3. Right ventricular systolic function is moderately reduced. The right  ventricular size is moderately enlarged. There is mildly elevated  pulmonary artery systolic pressure. The estimated right ventricular  systolic pressure is 44.5 mmHg.   4. Left atrial size was severely dilated.   5. Right atrial size was severely dilated.   6.  Tricuspid valve regurgitation is moderate.   7. Trivial PVL. The aortic valve has been repaired/replaced. Aortic valve  regurgitation is trivial. There is a 23 mm Sapien prosthetic (TAVR) valve  present in the aortic position. Procedure Date: 11/25/2018. Echo findings  are consistent with normal structure and function of the aortic valve prosthesis. Aortic valve area, by VTI measures 2.32 cm. Aortic valve mean gradient measures 10.0 mmHg. Aortic valve Vmax measures 2.14 m/s.   8. Aortic dilatation noted. 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   9. The inferior vena cava is normal in size with greater than 50%  respiratory variability, suggesting right atrial pressure of 3 mmHg.     Neuro/Psych CVA, No Residual Symptoms  negative psych ROS   GI/Hepatic negative GI ROS, Neg liver ROS,,,  Endo/Other  negative endocrine ROS    Renal/GU negative Renal ROS Bladder dysfunction (hx bladder ca)      Musculoskeletal negative musculoskeletal ROS (+)    Abdominal   Peds negative pediatric ROS (+)  Hematology negative hematology ROS (+)   Anesthesia Other Findings   Reproductive/Obstetrics negative OB ROS                             Anesthesia Physical Anesthesia Plan  ASA: 4  Anesthesia Plan: MAC   Post-op Pain Management:    Induction:   PONV Risk Score and Plan: 2 and Propofol  infusion and TIVA  Airway Management Planned: Natural Airway and Simple Face Mask  Additional Equipment: None  Intra-op  Plan:   Post-operative Plan:   Informed Consent: I have reviewed the patients History and Physical, chart, labs and discussed the procedure including the risks, benefits and alternatives for the proposed anesthesia with the patient or authorized representative who has indicated his/her understanding and acceptance.       Plan Discussed with:  CRNA  Anesthesia Plan Comments:        Anesthesia Quick Evaluation

## 2023-10-04 NOTE — Progress Notes (Signed)
 Reverse barbeau preformed with good wave form from right thumb

## 2023-10-06 ENCOUNTER — Encounter (HOSPITAL_COMMUNITY): Payer: Self-pay | Admitting: Cardiovascular Disease

## 2023-10-08 ENCOUNTER — Ambulatory Visit: Admitting: Cardiology

## 2023-10-08 ENCOUNTER — Other Ambulatory Visit: Payer: Self-pay | Admitting: Interventional Cardiology

## 2023-10-08 DIAGNOSIS — I4811 Longstanding persistent atrial fibrillation: Secondary | ICD-10-CM

## 2023-10-08 NOTE — Telephone Encounter (Signed)
 Eliquis  5mg  refill request received. Patient is 89 years old, weight-82.6kg, Crea-1.01 on 09/27/23, Diagnosis-Afib, and last seen by Dr. Arlester Ladd on 09/23/23. Dose is appropriate based on dosing criteria. Will send in refill to requested pharmacy.

## 2023-10-22 ENCOUNTER — Ambulatory Visit (HOSPITAL_COMMUNITY)
Admission: RE | Admit: 2023-10-22 | Discharge: 2023-10-22 | Disposition: A | Discharge status: Home / Self Care | Source: Ambulatory Visit | Attending: Cardiovascular Disease | Admitting: Cardiovascular Disease

## 2023-10-22 DIAGNOSIS — I340 Nonrheumatic mitral (valve) insufficiency: Secondary | ICD-10-CM

## 2023-10-22 DIAGNOSIS — Z952 Presence of prosthetic heart valve: Secondary | ICD-10-CM | POA: Diagnosis not present

## 2023-10-22 DIAGNOSIS — Z9889 Other specified postprocedural states: Secondary | ICD-10-CM

## 2023-10-22 MED ORDER — IOHEXOL 350 MG/ML SOLN
100.0000 mL | Freq: Once | INTRAVENOUS | Status: AC | PRN
  Administered 2023-10-22: 100 mL via INTRAVENOUS

## 2023-10-22 NOTE — Progress Notes (Signed)
 STS SCORE Procedure Type: Isolated MVR Perioperative Outcome Estimate % Operative Mortality 11.1% Morbidity & Mortality 24.7% Stroke 4.33% Renal Failure 3.79% Reoperation 3.26% Prolonged Ventilation 15.9% Deep Sternal Wound Infection 0.204% Long Hospital Stay (>14 days) 15.7% Short Hospital Stay (<6 days)* 10.6%

## 2023-10-25 ENCOUNTER — Other Ambulatory Visit: Payer: Self-pay | Admitting: Cardiology

## 2023-10-25 DIAGNOSIS — I5033 Acute on chronic diastolic (congestive) heart failure: Secondary | ICD-10-CM

## 2023-11-03 ENCOUNTER — Ambulatory Visit: Payer: Self-pay | Admitting: Cardiovascular Disease

## 2023-11-13 ENCOUNTER — Other Ambulatory Visit: Payer: Self-pay | Admitting: Cardiology

## 2023-11-13 DIAGNOSIS — I5032 Chronic diastolic (congestive) heart failure: Secondary | ICD-10-CM

## 2023-11-15 ENCOUNTER — Telehealth: Payer: Self-pay | Admitting: Cardiology

## 2023-11-15 NOTE — Telephone Encounter (Signed)
 Ann, pt's wife called in regarding low HR's and low BP's. Per PCP, is monitoring BP every morning.  BP Today was 82/53, Recent BP's: 106/71, 97/52, 93/61, 105/55, 100/55, 104/50.  HR recently has been ranging from 43-59.   Has been seen by Dr. Ladona, and most recently Dr. Wonda. Jenkins states pt may need Mitral Valve Repair and this valve has a large hole and a large tear, that may or may not be able to get fixed.   Current BP/HR Meds include:   Cardizem  30 mg TID  Lasix  20 mg once daily  Cozaar  12.5 mg once daily  Lopressor  75 mg BID  Aldactone  12.5 mg once daily  PCP is recommending Lopressor  be decreased to 50 mg BID and wants to know Cardiology recommendations.   Pt has not been sick recently, is staying well hydrated. Denies Chest Pain (but wife states he has been saying that for the last 2-3 weeks he will be glad when his heart gets fixed due to the feelings in his chest). Denies Shortness of breath, denies Lightheadedness/dizziness/syncope.

## 2023-11-15 NOTE — Telephone Encounter (Signed)
 Pt c/o BP issue: STAT if pt c/o blurred vision, one-sided weakness or slurred speech.   1. What is your BP concern?  BP has been decreasing over the past week.  2. Have you taken any BP medication today? Yes   3. What are your last 5 BP readings? 6/27: 104/50 43            82/53 45  4. Are you having any other symptoms (ex. Dizziness, headache, blurred vision, passed out)?  No

## 2023-11-15 NOTE — Telephone Encounter (Signed)
 Called and spoke to pt's wife, Jenkins. Advised that Dr. Cindie (DOD 1) recommends decreasing Metoprolol  tartrate to 25 mg (one tablet) two times daily. Pt currently takes 75 mg BID, and PCP had recommended this be decreased to 50 mg BID.   Reviewed ER precautions with wife, who verbalized understanding. They plan to see Dr. Lucas on 11/20/23.

## 2023-11-19 ENCOUNTER — Other Ambulatory Visit: Payer: Self-pay

## 2023-11-19 DIAGNOSIS — I340 Nonrheumatic mitral (valve) insufficiency: Secondary | ICD-10-CM

## 2023-11-19 DIAGNOSIS — Z9889 Other specified postprocedural states: Secondary | ICD-10-CM

## 2023-11-20 ENCOUNTER — Ambulatory Visit: Attending: Surgery | Admitting: Surgery

## 2023-11-20 ENCOUNTER — Encounter: Payer: Self-pay | Admitting: Surgery

## 2023-11-20 VITALS — BP 125/68 | HR 62 | Resp 20 | Ht 72.0 in | Wt 180.0 lb

## 2023-11-20 DIAGNOSIS — I340 Nonrheumatic mitral (valve) insufficiency: Secondary | ICD-10-CM | POA: Diagnosis not present

## 2023-11-20 NOTE — Progress Notes (Signed)
 Pre Surgical Assessment: 5 M Walk Test  66M=16.72ft  5 Meter Walk Test- trial 1: 7.80 seconds 5 Meter Walk Test- trial 2: 10.02 seconds 5 Meter Walk Test- trial 3: 9.25 seconds 5 Meter Walk Test Average: 9.25 seconds

## 2023-11-21 ENCOUNTER — Other Ambulatory Visit: Payer: Self-pay

## 2023-11-22 NOTE — Progress Notes (Unsigned)
 Patient ID: Eric [name], male   DOB: Jan 30, 1930, 89 y.o.   MRN: [id number]  [facility]   MULTIDISCIPLINARY HEART VALVE CLINIC         CARDIOTHORACIC SURGERY CONSULTATION REPORT  PCP is Rolinda Millman, MD Referring Provider is Ozell Fell, MD Primary Cardiologist is Gordy Bergamo, MD  Reason for consultation:  Severe mitral regurgitation s/p prior mitral valve repair.  HPI:  The patient is a 89 year old gentleman with a history of coronary disease and valvular heart disease who underwent single-vessel CABG with a SVG to OM, bioprosthetic AVR using a 23 mm Mitroflow pericardial valve and mitral valve repair with a 28 mm Sorin 3D Memo annuloplasty ring in 2009.  He subsequently underwent valve in valve TAVR in 2020 with a 23 mm Sapien 3 valve.  He has had severe mitral regurgitation and was admitted in February 2025 with atrial fibrillation with RVR and heart failure.  Improved with intravenous diuresis.  He is now feeling well overall but still has some fatigue and exertional dyspnea with moderate exertion.  TEE on 10/04/2023 showed severe mitral regurgitation.  There is a perivalvular leak between the annulus and the ring at the 11 o'clock position causing a jet of regurgitation but the predominant regurgitation is due to posterior leaflet restriction and lack of coaptation with the large anterior leaflet.  There is pulmonary vein systolic flow reversal.  The mean mitral valve gradient was 3 mmHg.  The aortic valve prosthesis is functioning appropriately with no stenosis and mild regurgitation.  Left ventricular ejection fraction of 65 to 70%.  He is here today with his wife and son.  He reports some decreased energy and shortness of breath that limits his activity level.  He has some leg swelling but does been better on Lasix .  He denies any chest pain or pressure.  He has had no dizziness or syncope.  Past Medical History:  Diagnosis Date   Atrial flutter, paroxysmal (HCC)     a. dx 04-02-2014, s/p  successful cardioversion 04-06-2014.   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 05/2007 CABG x 1: s/p SVG-OM;  b. 10/2010 Ex MV: EF 72%, inf attenuation w/o ischemia, brief run of PAT with exercise. To   Stroke [facility])    Thrombocytopenia (HCC)    Thyroid  goiter 2013   nodular   Wears hearing aid    BILATERAL   Wears partial dentures    UPPER    Past Surgical History:  Procedure Laterality Date  AORTIC VALVE REPLACEMENT (AVR)/CORONARY ARTERY BYPASS GRAFTING (CABG)  06-19-2007  dr lucas   SVG to OM1 ;   AVR w/ #23 Mitralflow pericardial and ligation left atrial appendage;  MV repair w/ #28 Sorin 3-D memo Ring Annuloplasty with repair ruptured chordar of P-2 flail segments   CARDIAC CATHETERIZATION  05-12-2007  dr malva   single vessel 30% ostial LAD,  80% LCx;  severe to critial AS,  moderate MR,  normal LVF, ef 70%,  normal right heart pressures and cardiac outputs,  mild elevated LV end-diastolic pressure     CARDIOVASCULAR STRESS TEST  11-02-2010  dr dann   normal nuclear study w/ no ischemia or infarct/scar/  normal LV function and wall  motion , ef 72%   CARDIOVERSION N/A 04/06/2014   Procedure: CARDIOVERSION;  Surgeon: Leim VEAR Moose, MD;  Location: [facility] ENDOSCOPY;  Service: Cardiovascular;  Laterality: N/A;   successful   CATARACT EXTRACTION W/ INTRAOCULAR LENS  IMPLANT, BILATERAL  2012  approx   CORONARY ARTERY BYPASS GRAFT     PROSTATECTOMY     RETROPUBIC RADICAL PROSTATECTOMY  06/ 2000  in [place], [place]   RIGHT/LEFT HEART CATH AND CORONARY/GRAFT ANGIOGRAPHY N/A 10/29/2018   Procedure: RIGHT/LEFT HEART CATH AND CORONARY/GRAFT ANGIOGRAPHY;  Surgeon: Wonda Sharper, MD;  Location: [facility] INVASIVE CV LAB;  Service: Cardiovascular;  Laterality: N/A;   RIGHT/LEFT HEART CATH AND CORONARY/GRAFT ANGIOGRAPHY N/A 10/04/2023   Procedure: RIGHT/LEFT HEART CATH AND CORONARY/GRAFT ANGIOGRAPHY;  Surgeon: Wonda Sharper, MD;  Location: [facility] INVASIVE CV LAB;  Service: Cardiovascular;  Laterality: N/A;   ROTATOR CUFF REPAIR Left 09/1998   TEE WITHOUT CARDIOVERSION N/A 04/06/2014   Procedure: TRANSESOPHAGEAL ECHOCARDIOGRAM (TEE);  Surgeon: Leim VEAR Moose, MD;  Location: [facility] ENDOSCOPY;  Service: Cardiovascular;  Laterality: N/A;  mild focal basa LVH of the septum, ef 50-55%, s/p AV annuloplasty ring, elongated chordae, thickened leaflets, mild to mod. MR, multiple small jets/arotic bioprosthetic valve sits well in position, mild AI/ thickened TV w/ mild reurg./mild LA   TEE WITHOUT CARDIOVERSION N/A 10/22/2018   Procedure: TRANSESOPHAGEAL ECHOCARDIOGRAM (TEE);  Surgeon: Jeffrie Oneil BROCKS, MD;  Location[facility] ENDOSCOPY;  Service: Cardiovascular;  Laterality: N/A;   TEE WITHOUT CARDIOVERSION N/A 11/25/2018   Procedure: TRANSESOPHAGEAL ECHOCARDIOGRAM (TEE);  Surgeon: Wonda Sharper, MD;  Location: [facility] OR;  Service: Open Heart Surgery;  Laterality: N/A;   TRANSCATHETER AORTIC VALVE REPLACEMENT, TRANSFEMORAL N/A 11/25/2018   Procedure: TRANSCATHETER AORTIC VALVE REPLACEMENT, TRANSFEMORAL;  Surgeon: Wonda Sharper, MD;  Location: [facility] OR;  Service: Open Heart  Surgery;  Laterality: N/A;   TRANSESOPHAGEAL ECHOCARDIOGRAM (CATH LAB) N/A 10/04/2023   Procedure: TRANSESOPHAGEAL ECHOCARDIOGRAM;  Surgeon: Santo Stanly LABOR, MD;  Location: [facility] INVASIVE CV LAB;  Service: Cardiovascular;  Laterality: N/A;   TRANSTHORACIC ECHOCARDIOGRAM  03-23-2016   dr dann   EF 55-60%, reduced contribution atrial contraction to ventricular filling, due to increased ventricular diastolic pressure or atrial contratile dysfunction/  bioprosthetic AV w/ mod. regurg. (valve area 1.97cm^2)/ mild dilated ascending aorta/ mild thicken MV normal function annular ring prosthesis/severe LAE & mod. to sev.RAE/ PASP 71mmHg/ mild TR/ ventricle septal motion show paradox   TRANSURETHRAL RESECTION OF BLADDER TUMOR N/A 02/13/2016   Procedure: TRANSURETHRAL RESECTION OF BLADDER TUMOR (TURBT);  Surgeon: Redell Lynwood Napoleon, MD;  Location: [facility];  Service: Urology;  Laterality: N/A;   TRANSURETHRAL RESECTION OF BLADDER TUMOR N/A 09/04/2016   Procedure: TRANSURETHRAL RESECTION OF BLADDER TUMOR (TURBT);  Surgeon: Redell Lynwood Napoleon, MD;  Location: [facility];  Service: Urology;  Laterality: N/A;    Family History  Problem Relation Age of Onset   Leukemia Father    Supraventricular tachycardia Sister    Heart attack Neg Hx    Stroke Neg Hx    Hypertension Neg Hx     Social History   Socioeconomic History   Marital status: Married    Spouse name: Ann   Number of children: 2   Years of education: Not on file   Highest education level: Some college, no degree  Occupational History   Occupation: Retired  Tobacco Use   Smoking status: Never   Smokeless tobacco: Never  Vaping Use   Vaping status: Never Used  Substance and Sexual Activity   Alcohol use: Yes    Alcohol/week: 1.0 - 2.0 standard drink of alcohol    Types: 1 - 2 Standard drinks or equivalent per week    Comment: OCC   Drug use: No   Sexual activity: Not on file  Other Topics  Concern   Not on file  Social History Narrative   01/03/21 Lives in [place] with wife.  Active.   Caffeine- 1-2 c coffee daily   Social Drivers of Corporate investment banker Strain: Low Risk  (01/01/2019)   Overall Financial Resource Strain (CARDIA)    Difficulty of Paying Living Expenses: Not hard at all  Food Insecurity: No Food Insecurity (06/29/2023)   Hunger Vital Sign    Worried About Running Out of Food in the Last Year: Never true    Ran Out of Food in the Last Year: Never true  Transportation Needs: No Transportation Needs (06/29/2023)   PRAPARE - Administrator, Civil Service (Medical): No    Lack of Transportation (Non-Medical): No  Physical Activity: Inactive (01/01/2019)   Exercise Vital Sign    Days of Exercise per Week: 0 days    Minutes of Exercise per Session: 0 min  Stress: No Stress Concern Present (01/01/2019)   [employer] of Occupational Health - Occupational Stress Questionnaire    Feeling of Stress : Only a little  Social Connections: Unknown (06/29/2023)   Social Connection and Isolation Panel    Frequency of Communication with Friends and Family: Three times a week    Frequency of Social Gatherings with Friends and Family: More than three times a week    Attends Religious Services: Patient declined    Active Member of Clubs or Organizations: Patient declined    Attends Engineer, structural: More than 4 times per year    Marital Status: Married  Catering manager Violence: Not At Risk (06/29/2023)   Humiliation, Afraid, Rape, and Kick questionnaire    Fear of Current or Ex-Partner: No    Emotionally Abused: No    Physically Abused: No    Sexually Abused: No    Prior to Admission medications   Medication Sig Start Date End Date Taking? Authorizing Provider  amoxicillin  (AMOXIL ) 500 MG tablet Take 4 tablets (2,000 mg total) by mouth as directed. Take 1 hour prior to dental work, including cleanings. 04/27/19  Yes Dann Candyce RAMAN, MD   apixaban  (ELIQUIS ) 5 MG TABS tablet TAKE 1 TABLET BY MOUTH TWICE  DAILY 10/08/23  Yes Wonda Sharper, MD  Ascorbic Acid (VITAMIN C PO) Take 500 mg by mouth in the morning. Gummies 250 mg each   Yes [provider]  atorvastatin  (LIPITOR) 20 MG tablet Take 40 mg by mouth every evening.   Yes [provider]  diltiazem  (CARDIZEM ) 30 MG tablet Take 1 tablet (30 mg total) by mouth 3 (three) times daily. 09/10/23  Yes Ladona Heinz, MD  Eyelid Cleansers (OCUSOFT EYELID CLEANSING) PADS Place 1 application into the left eye in the morning.   Yes [provider]  ezetimibe  (ZETIA ) 10 MG tablet TAKE 1 TABLET BY MOUTH IN THE  EVENING 02/27/23  Yes Dann Candyce RAMAN, MD  furosemide  (LASIX ) 20 MG tablet Take 1 tablet (20 mg total) by mouth daily. 08/09/23  Yes Ladona Heinz, MD  losartan  (COZAAR ) 25 MG tablet TAKE 1 TABLET(25 MG) BY MOUTH EVERY EVENING Patient taking differently: Take 12.5 mg by mouth at bedtime. 11/13/23  Yes Ladona Heinz, MD  metoprolol  tartrate (LOPRESSOR ) 25 MG tablet Take 3 tablets (75 mg total) by mouth 2 (two) times daily. Patient taking differently: Take 25 mg by mouth 2 (two) times daily. 08/09/23  Yes Ladona Heinz, MD  nitroGLYCERIN  (NITROSTAT ) 0.4 MG SL tablet Place 1 tablet (0.4 mg total) under the tongue every 5 (five) minutes as needed. 02/04/23 11/20/23 Yes Dann Candyce RAMAN, MD  spironolactone  (ALDACTONE ) 25 MG tablet TAKE 1 TABLET(25 MG) BY MOUTH EVERY MORNING Patient taking differently: Take 12.5 mg by mouth in the morning. 10/25/23  Yes Ladona Heinz, MD    Current Outpatient Medications  Medication Sig Dispense Refill   amoxicillin  (AMOXIL ) 500 MG tablet Take 4 tablets (2,000 mg total) by mouth as directed. Take 1 hour prior to dental work, including cleanings. 4 tablet 6   apixaban  (ELIQUIS ) 5 MG TABS tablet TAKE 1 TABLET BY MOUTH TWICE  DAILY 180 tablet 2   Ascorbic Acid (VITAMIN C PO) Take 500 mg by mouth in the morning. Gummies 250 mg each      atorvastatin  (LIPITOR) 20 MG tablet Take 40 mg by mouth every evening.     diltiazem  (CARDIZEM ) 30 MG tablet Take 1 tablet (30 mg total) by mouth 3 (three) times daily. 270 tablet 3   Eyelid Cleansers (OCUSOFT EYELID CLEANSING) PADS Place 1 application into the left eye in the morning.     ezetimibe  (ZETIA ) 10 MG tablet TAKE 1 TABLET BY MOUTH IN THE  EVENING 90 tablet 3   furosemide  (LASIX ) 20 MG tablet Take 1 tablet (20 mg total) by mouth daily. 90 tablet 3   losartan  (COZAAR ) 25 MG tablet TAKE 1 TABLET(25 MG) BY MOUTH EVERY EVENING (Patient taking differently: Take 12.5 mg by mouth at bedtime.) 30 tablet 2   metoprolol  tartrate (LOPRESSOR ) 25 MG tablet Take 3 tablets (75 mg total) by mouth 2 (two) times daily. (Patient taking differently: Take 25 mg by mouth 2 (two) times daily.) 540 tablet 2   nitroGLYCERIN  (NITROSTAT ) 0.4 MG SL tablet Place 1 tablet (0.4 mg total) under the tongue every 5 (five) minutes as needed. 25 tablet 3   spironolactone  (ALDACTONE ) 25 MG tablet TAKE 1 TABLET(25 MG) BY MOUTH EVERY MORNING (Patient taking differently: Take 12.5 mg by mouth in the morning.) 90 tablet 2   No current facility-administered medications for this visit.    No Known Allergies    Review of Systems:   General:  normal appetite, + decreased energy, no weight gain, no weight loss, no fever  Cardiac:  no chest pain with exertion, no chest pain at rest, + SOB with moderate exertion, no resting SOB, no PND, no orthopnea, no palpitations, no arrhythmia, + atrial fibrillation, + LE edema, no dizzy spells, no syncope  Respiratory:  + exertional shortness of breath, no home oxygen,  no productive cough, no dry cough, no bronchitis, no wheezing, no hemoptysis, no asthma, no pain with inspiration or cough, no sleep apnea, no CPAP at night  GI:   no difficulty swallowing, no reflux, no frequent heartburn, no hiatal hernia, no abdominal pain, no constipation, no diarrhea, no hematochezia, no hematemesis, no  melena  GU:   no dysuria,  no frequency, no urinary tract infection, no hematuria, no enlarged prostate, no kidney stones, no kidney disease  Vascular:  no pain suggestive of claudication, no pain in feet, no leg cramps, no varicose veins, no DVT, no non-healing foot ulcer  Neuro:   no stroke, no TIA's, no seizures, no headaches, no temporary blindness one eye,  no slurred speech, no peripheral neuropathy, no chronic pain, no instability of gait, no memory/cognitive dysfunction  Musculoskeletal: no arthritis, no joint swelling, no myalgias, + difficulty walking, + decreased mobility   Skin:   no rash, no itching, no skin infections, no pressure sores or ulcerations  Psych:   no anxiety, no depression, no nervousness, no unusual recent stress  Eyes:   no blurry vision, no floaters, no recent vision changes, no glasses or contacts  ENT:   no hearing loss, no loose or painful teeth, no dentures, see his dentist regularly  Hematologic:  + easy bruising, no abnormal bleeding, no clotting disorder, no frequent epistaxis  Endocrine:  no diabetes, does not check CBG's at home     Physical Exam:   BP 125/68   Pulse 62   Resp 20   Ht 6' (1.829 m)   Wt 180 lb (81.6 kg)   SpO2 98% Comment: RA  BMI 24.41 kg/m   General:  Elderly,  well-appearing  HEENT:  Unremarkable, NCAT, PERLA, EOMI  Neck:   no JVD, no bruits, no adenopathy   Chest:   clear to auscultation, symmetrical breath sounds, no wheezes, no rhonchi   CV:   RRR, 3/6 systolic murmur at apex  Abdomen:  soft, non-tender, no masses   Extremities:  warm, well-perfused, pedal pulses palpable, no lower extremity edema  Rectal/GU  Deferred  Neuro:   Grossly non-focal and symmetrical throughout  Skin:   Clean and dry, no rashes, no breakdown  Diagnostic Tests:  TRANSESOPHOGEAL ECHO REPORT       Patient Name:   BENTLEE BENNINGFIELD Date of Exam: 10/04/2023 Medical Rec #:  [id number]       Height:       72.0 in Accession #:    [id number]       Weight:       182.0 lb Date of Birth:  21-Jan-1930       BSA:          2.047 m Patient Age:    89 years        BP:           144/91 mmHg Patient Gender: M               HR:           45 bpm. Exam Location:  Inpatient  Procedure: 3D Echo, Transesophageal Echo, Color Doppler and Cardiac Doppler            (Both Spectral and Color Flow Doppler were utilized during            procedure).  Indications:     Mitral valve eval   History:         Patient has prior history of Echocardiogram examinations, most  recent 07/01/2023.   Sonographer:     Tinnie Gosling RDCS Referring Phys:  [id number] [facility] A SANTO Diagnosing Phys: Stanly SANTO MD  PROCEDURE: The transesophogeal probe was passed without difficulty through the esophogus of the patient. Sedation performed by different physician. The patient developed no complications during the procedure.   IMPRESSIONS    1. Left ventricular ejection fraction, by estimation, is 65 to 70%. The left ventricle has normal function.  2. Right ventricular systolic function is normal. The right ventricular size is mildly enlarged.  3. Left atrial size was severely dilated. No left atrial/left atrial appendage thrombus was detected.  4. Right atrial size was severely dilated.  5. Patient is s/p ring repair. There is severe mitral regurgitation- mechanism is complex. There is paravalvular leak at the 11 o'clock position (with mild to moderate regurgitation). There is posterior restriction creatining the largest jet. There is evidence of posterior flail creating a smaller jet. There is an overriding anterior leaflet, worst at A2, with no native LVOT obstruction. Sufficient height for transeptal approach. Pulmonary vein systolic flow reversal. PVL VCA 0.35 cm2. Native area 3.22 cm2. The mitral valve has been repaired/replaced. Severe mitral valve regurgitation. Mild mitral stenosis.  6. Tricuspid valve regurgitation is mild  to moderate.  7. The aortic valve has been repaired/replaced. Aortic valve regurgitation is mild. No aortic stenosis is present.  8. Normal variant PV anatomy: Right middly pulmonary vein.  9. 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  FINDINGS  Left Ventricle: Left ventricular ejection fraction, by estimation, is 65 to 70%. The left ventricle has normal function. The left ventricular internal cavity size was normal in size.  Right Ventricle: The right ventricular size is mildly enlarged. No increase in right ventricular wall thickness. Right ventricular systolic function is normal.  Left Atrium: Left atrial size was severely dilated. No left atrial/left atrial appendage thrombus was detected.  Right Atrium: Right atrial size was severely dilated.  Pericardium: There is no evidence of pericardial effusion.  Mitral Valve: Patient is s/p ring repair. There is severe mitral regurgitation- mechanism is complex. There is paravalvular leak at the 11 o'clock position (with mild to moderate regurgitation). There is posterior restriction creatining the largest jet. There is evidence of posterior flail creating a smaller jet. There is an overriding anterior leaflet, worst at A2, with no native LVOT obstruction. Sufficient height for transeptal approach. Pulmonary vein systolic flow reversal. PVL VCA 0.35 cm2. Native  area 3.22 cm2. The mitral valve has been repaired/replaced. Severe mitral valve regurgitation. Mild mitral valve stenosis. The mean mitral valve gradient is 3.0 mmHg with average heart rate of 47 bpm.  Tricuspid Valve: The tricuspid valve is normal in structure. Tricuspid valve regurgitation is mild to moderate. No evidence of tricuspid stenosis.  Aortic Valve: The aortic valve has been repaired/replaced. Aortic valve regurgitation is mild. No aortic stenosis is present. There is a 23 mm Sapien prosthetic, stented (TAVR) valve present  in the aortic position.  Pulmonic Valve: The pulmonic valve was normal in structure. Pulmonic valve regurgitation is trivial. No evidence of pulmonic stenosis.  Aorta: The aortic root and ascending aorta are structurally normal, with no evidence of dilitation.  Venous: Normal variant PV anatomy: Right middly pulmonary vein.  IAS/Shunts: No atrial level shunt detected by color flow Doppler.  Additional Comments: 3D was performed not requiring image post processing on an independent workstation and was indeterminate.    AORTA Ao Asc diam: 3.60 cm  MITRAL  VALVE MV Mean grad: 3.0 mmHg  Stanly Leavens MD Electronically signed by Stanly Leavens MD Signature Date/Time: 10/04/2023/1:31:53 PM       Final     hysicians  Panel Physicians Referring Physician Case Authorizing Physician  Wonda Sharper, MD (Primary)     Procedures  RIGHT/LEFT HEART CATH AND CORONARY/GRAFT ANGIOGRAPHY   Conclusion  1.  Single-vessel coronary artery disease with moderate 75% eccentric stenosis of the proximal to mid circumflex 2.  Continued patency of the SVG to OM1 3.  Widely patent left main, LAD, and RCA with no significant stenoses 4.  Right heart catheterization data: RA mean 6 mmHg RV pressure 47 over 5 mmHg PA pressure 51/16 with a mean of 32 mmHg Pulmonary wedge A-wave 13, V wave 41, mean 22 mmHg Cardiac output 5.8 L/min with cardiac index 2.8 L/min/m   Discussion: Patent with stable coronary anatomy, patent native vessels with moderately severe circumflex stenosis and a patent SVG to OM.  Hemodynamics are pertinent for a large V wave consistent with the patient's severe mitral regurgitation as well as associated moderate pulmonary hypertension.  Transpulmonary gradient is only 10 mmHg with a PVR of 1.7 Wood units.  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  Plan to proceed with CT angiography to continue the patient's  evaluation for transcatheter mitral valve and ring replacement.   Surgeon Notes    10/04/2023  8:22 AM CV Procedure signed by Leavens Stanly LABOR, MD   Indications  Nonrheumatic mitral (valve) insufficiency [I34.0 (ICD-10-CM)]   Procedural Details  Technical Details INDICATION: Severe MR, Hx of MV repair, bioprosthetic AVR, and single vessel CABG remotely. Also with hx of valve in valve TAVR in 2000.  PROCEDURAL DETAILS: There was an indwelling IV in a right antecubital vein. Using normal sterile technique, the IV was changed out for a 5 Fr brachial sheath over a 0.018 inch wire. The right wrist was then prepped, draped, and anesthetized with 1% lidocaine . Using the modified Seldinger technique a 5/6 French Slender sheath was placed in the right radial artery. Intra-arterial verapamil  was administered through the radial artery sheath. IV heparin  was administered after a JR4 catheter was advanced into the central aorta. A Swan-Ganz catheter was used for the right heart catheterization. Standard protocol was followed for recording of right heart pressures and sampling of oxygen saturations. Fick cardiac output was calculated. Standard Judkins catheters were used for selective coronary angiography. There were no immediate procedural complications. The patient was transferred to the post catheterization recovery area for further monitoring.      Estimated blood loss <50 mL.   During this procedure medications were administered to achieve and maintain moderate conscious sedation while the patient's heart rate, blood pressure, and oxygen saturation were continuously monitored and I was present face-to-face 100% of this time. Dallas Pleva RN and Rolin Meadows Cardiovascular Specialist are independent, trained observers who assisted in the monitoring of the patient's level of consciousness.   Medications (Filter: Administrations occurring from 1228 to 1337 on 10/04/23)  important  Continuous  medications are totaled by the amount administered until 10/04/23 1337.   midazolam  (VERSED ) injection (mg)  Total dose: 1 mg Date/Time Rate/Dose/Volume Action   10/04/23 1254 1 mg Given   fentaNYL  (SUBLIMAZE ) injection (mcg)  Total dose: 25 mcg Date/Time Rate/Dose/Volume Action   10/04/23 1254 25 mcg Given   0.9 %  sodium chloride  infusion (mL/hr)  Total volume: 7.03 mL Date/Time Rate/Dose/Volume Action   10/04/23 1255 10 mL/hr  New Bag/Given   Heparin  (Porcine) in NaCl 1000-0.9 UT/500ML-% SOLN (mL)  Total volume: 1,000 mL Date/Time Rate/Dose/Volume Action   10/04/23 1255 1,000 mL Given   lidocaine  (PF) (XYLOCAINE ) 1 % injection (mL)  Total volume: 10 mL Date/Time Rate/Dose/Volume Action   10/04/23 1300 5 mL Given   1301 5 mL Given   heparin  sodium (porcine) injection (Units)  Total dose: 4,000 Units Date/Time Rate/Dose/Volume Action   10/04/23 1311 4,000 Units Given   iohexol  (OMNIPAQUE ) 350 MG/ML injection (mL)  Total volume: 50 mL Date/Time Rate/Dose/Volume Action   10/04/23 1327 50 mL Given   Radial Cocktail/Verapamil  only (mL)  Total volume: 8 mL Date/Time Rate/Dose/Volume Action   10/04/23 1303 8 mL Given    Sedation Time  Sedation Time Physician-1: 32 minutes 54 seconds Contrast     Administrations occurring from 1228 to 1337 on 10/04/23:  Medication Name Total Dose  iohexol  (OMNIPAQUE ) 350 MG/ML injection 50 mL   Radiation/Fluoro  Fluoro time: 9.7 (min) DAP: 17626 (mGycm2) Cumulative Air Kerma: 312 (mGy) Coronary Findings  Diagnostic Dominance: Right Left Main  The left main is patent and divides into the LAD and left circumflex. The patient has very mild distal plaquing.  Mid LM to Dist LM lesion is 25% stenosed. The lesion is mildly calcified.    Left Anterior Descending  Vessel is large. The vessel exhibits minimal luminal irregularities. The LAD courses to the LV apex. The LAD has mild diffuse plaque but no significant stenosis throughout its  distribution. The first diagonal is large in caliber.    Left Circumflex  Prox Cx lesion is 75% stenosed.    Second Obtuse Marginal Branch  The circumflex has an eccentric 75% proximal vessel stenosis. The vein graft to the first OM is widely patent.    Right Coronary Artery  There is mild diffuse disease throughout the vessel. The RCA is patent throughout. The PDA and PLA branches are patent. There is mild diffuse nonobstructive plaquing in the RCA. No high-grade stenoses.  Mid RCA lesion is 40% stenosed. The lesion is moderately calcified.    Saphenous Graft To 1st Mrg  SVG and is normal in caliber. The graft exhibits no disease. The saphenous vein graft to first OM is widely patent throughout. There are no significant stenoses. The graft fills the circumflex in retrograde fashion.    Intervention   No interventions have been documented.   Coronary Diagrams  Diagnostic Dominance: Right  Intervention   Implants   No implant documentation for this case.   Syngo Images   Show images for CARDIAC CATHETERIZATION Images on Long Term Storage   Show images for Osei, Anger Link to Procedure Log  Procedure Log    Hemo Data  Flowsheet Row Most Recent Value  Fick Cardiac Output 5.76 L/min  Fick Cardiac Output Index 2.81 (L/min)/BSA  RA A Wave 8 mmHg  RA V Wave 9 mmHg  RA Mean 6 mmHg  RV Systolic Pressure 50 mmHg  RV Diastolic Pressure 15 mmHg  RV EDP 23 mmHg  PA Systolic Pressure 51 mmHg  PA Diastolic Pressure 16 mmHg  PA Mean 32 mmHg  PW A Wave 13 mmHg  PW V Wave 41 mmHg  PW Mean 22 mmHg  AO Systolic Pressure 45 mmHg  AO Diastolic Pressure 45 mmHg  AO Mean 45 mmHg  QP/QS 1  TPVR Index 11.4 HRUI  TSVR Index 16.03 HRUI  PVR SVR Ratio 0.26  TPVR/TSVR Ratio 0.71   Narrative & Impression  CLINICAL DATA:  Prior AVR/TAVR with Mitral Valve repair   Pre Mitral Valve in Ring Assessment   Severe MR   MEDICATIONS: No meds given   EXAM: Gated cardiac  CTA   TECHNIQUE: The patient was scanned on a GE apex scanner. 1 beat acquisition triggered in the descending thoracic aorta at 110 HU's. A non contrast, gated CT scan was obtained first with axial slices of 2.5 mm through the heart for valve scoring. A 120 kV retrospective, gated, contrast scan done with gantry rotation speed of 230 msec and collimation 0.63 mm. A delayed scan was obtained to exclude LAA thrombus. The 3D data set was reconstructed in 5% intervals of the R-R cycle. Best systolic phase was motion corrected Images were analyzed on a dedicated workstation using MPR, MIP and VRT modes The patient received 100 cc of contrast   FINDINGS: Severe bi atrial enlargement. Large wind sock appendage has not been surgically occluded. Cannot r/o thrombus in mid/apical appendage vs mixing artifact No delayed imaging performed. However non contrast imaging for calcium  scoring showed no apparent thrombus in the LAA. There is no ASD/PFO. The valve in bioprosthetic TAVR is well positioned with no PVL and no HALT/HAM on leaflets. The native MV leaflets appear thickened with some posterior leaflet prolapse. There is an area of severe bulky calcification below the posterior annulus/chords measuring 8.8 mm x 7.3 mm. The is an area of PVL around the MV ring laterally measuring 6.2 mm x 4.9 mm with average diameter 5.5 mm, area 0.24 cm2 and perimeter of 17.6 mm.   The patient has a 28 mm Sorin mitral annuloplasty ring implanted 2009 2D dimensions below   Diastole   TT Distance 23 mm   SL Distance 16.4 mm   IC Distance 26.3 mm   Systole   TT Distance 20.7 mm   SL Distance 16 mm   IC Distance 25.5 mm   The mitral plane aortic angle is 120 degrees   The aortic root anlge is 47 degrees   Using the best diastolic phase and a 26 mm valve model the Neo LVOT measures 436 mm2   Using the best systolic phase and a 26 mm valve model the Neo LVOT measures 427 mm2   Prior AVR with  bioprosthetic AVR 23 mm Mitroflow and subsequent TAVR using 23 mm Sapien 3 valve No HALT/HAM noted   No PVL noted.  Mild ascending thoracic aorta dilatation 3.8 cm   There is a patent SVG to the OM branch.   IMPRESSION: 1. 28 mm Sorin annuloplasty ring with native posterior leaflet prolapse. PVL noted anterior and laterally with area of 0.24 cm2   2. Neo LVOT 436 mm2 in diastole and 427 mm2 in systole modeled with a 26 mm Sapien valve   3. Normal appearing valve in valve AV with 23 mm Sapien 3 valve No PVL No HALT/HAM   4.  Patent SVG to OM   5. Mitral aortic angle of 120 degrees. With aortic root angle 47 degrees   6. Windsock appendage with mixing artifact on contrast images. No apparent thrombus on non contrast images   7.  Severe bi atrial enlargement   8.  No ASD/PFO   9.  Mild dilatation of the ascending thoracic aorta 3.8 cm   10. Area of dense calcification sub-annular to Sorin ring involving posterior chords measuring 8.8 mm x 7.3 mm   Maude Emmer   Electronically Signed: By: Maude Emmer M.D. On: 10/22/2023 12:48  Impression:  This 89 year old gentleman has severe mitral regurgitation status post remote mitral valve repair with quadrangular resection of a flail posterior leaflet and ring annuloplasty with a 28 mm Sorin 3D Memo ring.  He has developed NYHA class III symptoms of exertional fatigue and shortness of breath with a hospitalization in February for acute heart failure and volume overload in the setting of atrial fibrillation with RVR.  He is symptomatically improved on diuretic and GDMT with rate control of atrial fibrillation.  He is not a candidate for redo sternotomy for open mitral valve replacement given his advanced age.  I think the options for treatment are continued medical therapy versus valve in ring mitral valve replacement using a SAPIEN 3 valve.  He also has a paravalvular leak below the annuloplasty ring from partial dehiscence which  could be plugged but obviously may be difficult.  He is feeling fairly well but is limited in his activity level.  He would like to proceed with replacement of his valve if we feel that is possible.  The patient and his wife and son were counseled at length regarding treatment alternatives for management of severe symptomatic mitral regurgitation. The risks and benefits of surgical intervention has been discussed in detail. Long-term prognosis with medical therapy was discussed. Alternative approach of continued medical therapy was compared and contrasted at length. This discussion was placed in the context of the patient's own specific clinical presentation and past medical history. All of their questions have been addressed.   Following the decision to proceed with transcatheter valve in ring mitral valve replacement, a discussion was held regarding what types of management strategies would be attempted intraoperatively in the event of life-threatening complications, including whether or not the patient would be considered a candidate for the use of cardiopulmonary bypass and/or conversion to open sternotomy for attempted surgical intervention.  I do not think he is a candidate for emergent sternotomy to manage any intraoperative complications.  The patient has been advised of a variety of complications that might develop including but not limited to risks of death, stroke, paravalvular leak, aortic dissection or other major vascular complications, device embolization, cardiac rupture or perforation, left ventricular outflow track obstruction, mitral regurgitation, acute myocardial infarction, arrhythmia, heart block or bradycardia requiring permanent pacemaker placement, congestive heart failure, respiratory failure, renal failure, pneumonia, infection, other late complications related to structural valve deterioration or migration, or other complications that might ultimately cause a temporary or permanent  loss of functional independence or other long term morbidity. The patient provides full informed consent for the procedure as described and all questions were answered.   Plan:  He is tentatively being scheduled for valve and ring mitral valve replacement using a SAPIEN 3 valve on 11/26/2023.   I spent 60 minutes performing this consultation and > 50% of this time was spent face to face counseling and coordinating the care of this patient's severe, symptomatic mitral valve regurgitation.   Eric LOIS Fellers, MD 11/20/2023

## 2023-11-25 ENCOUNTER — Inpatient Hospital Stay (HOSPITAL_COMMUNITY): Admission: RE | Admit: 2023-11-25 | Source: Ambulatory Visit

## 2023-11-26 ENCOUNTER — Inpatient Hospital Stay (HOSPITAL_COMMUNITY): Admission: RE | Admit: 2023-11-26 | Source: Home / Self Care | Admitting: Cardiovascular Disease

## 2023-11-26 ENCOUNTER — Encounter (HOSPITAL_COMMUNITY): Admission: RE | Source: Home / Self Care

## 2023-11-26 DIAGNOSIS — I340 Nonrheumatic mitral (valve) insufficiency: Secondary | ICD-10-CM

## 2023-11-26 SURGERY — MITRAL PARAVALVULAR LEAK REPAIR
Anesthesia: General

## 2023-12-24 ENCOUNTER — Other Ambulatory Visit: Payer: Self-pay | Admitting: Physician Assistant

## 2023-12-24 DIAGNOSIS — Z952 Presence of prosthetic heart valve: Secondary | ICD-10-CM

## 2023-12-26 ENCOUNTER — Other Ambulatory Visit: Payer: Self-pay | Admitting: Interventional Cardiology

## 2023-12-30 ENCOUNTER — Ambulatory Visit: Attending: Physician Assistant | Admitting: Physician Assistant

## 2023-12-30 VITALS — BP 104/68 | HR 84 | Ht 72.0 in | Wt 174.0 lb

## 2023-12-30 DIAGNOSIS — Z952 Presence of prosthetic heart valve: Secondary | ICD-10-CM

## 2023-12-30 DIAGNOSIS — I25810 Atherosclerosis of coronary artery bypass graft(s) without angina pectoris: Secondary | ICD-10-CM | POA: Diagnosis not present

## 2023-12-30 DIAGNOSIS — I5032 Chronic diastolic (congestive) heart failure: Secondary | ICD-10-CM | POA: Diagnosis not present

## 2023-12-30 DIAGNOSIS — I4821 Permanent atrial fibrillation: Secondary | ICD-10-CM

## 2023-12-30 DIAGNOSIS — I10 Essential (primary) hypertension: Secondary | ICD-10-CM

## 2023-12-30 DIAGNOSIS — I824Z9 Acute embolism and thrombosis of unspecified deep veins of unspecified distal lower extremity: Secondary | ICD-10-CM

## 2023-12-30 MED ORDER — DILTIAZEM HCL 30 MG PO TABS: 30.0000 mg | ORAL_TABLET | Freq: Every day | ORAL | 0 refills | Status: DC | PRN

## 2023-12-30 MED ORDER — LOSARTAN POTASSIUM 25 MG PO TABS
12.5000 mg | ORAL_TABLET | Freq: Every day | ORAL | 1 refills | Status: DC
Start: 2023-12-30 — End: 2024-01-16

## 2023-12-30 MED ORDER — SPIRONOLACTONE 25 MG PO TABS
12.5000 mg | ORAL_TABLET | Freq: Every morning | ORAL | 1 refills | Status: AC
Start: 2023-12-30 — End: ?

## 2023-12-30 MED ORDER — METOPROLOL TARTRATE 25 MG PO TABS: 50.0000 mg | ORAL_TABLET | Freq: Two times a day (BID) | ORAL | 1 refills | Status: DC

## 2023-12-30 NOTE — Progress Notes (Signed)
 [facility]   MULTIDISCIPLINARY HEART VALVE CLINIC                                     Cardiology Office Note:    Date:  12/30/2023   ID:  Eric [name], DOB Apr 05, 1930, MRN [id number]  PCP:  Rolinda Millman, MD  [facility] Cardiologist:  Gordy Bergamo, MD  [facility] Structural heart: Ozell Fell, MD [facility] Electrophysiologist:  None   Referring MD: Rolinda Millman, MD   Post hospital follow up s/p TMVR at outside hospital  History of Present Illness:    Eric [name] is a 89 y.o. male with a hx of persistent atrial fibrillation on Eliquis , coronary disease and valvular heart disease who underwent single-vessel CABG with a SVG to OM, bioprosthetic AVR using a 23 mm Mitroflow pericardial valve and mitral valve repair with a 28 mm Sorin 3D Memo annuloplasty ring in 2009, and valve in valve TAVR in 2020 with a 23 mm Sapien 3 valve with lampoon on 12/19/23 at [facility] who presents to clinic for follow up.   He has had severe mitral regurgitation and was admitted in 06/2023 with atrial fibrillation with RVR and heart failure.  Improved with intravenous diuresis.  He continued to have fatigue and exertional dyspnea with moderate exertion.  TEE on 10/04/2023 showed severe mitral regurgitation.  There is a perivalvular leak between the annulus and the ring at the 11 o'clock position causing a jet of regurgitation but the predominant regurgitation is due to posterior leaflet restriction and lack of coaptation with the large anterior leaflet. There is pulmonary vein systolic flow reversal.  The mean mitral valve gradient was 3 mmHg.  The aortic valve prosthesis is functioning appropriately with no stenosis and mild regurgitation.  Left ventricular ejection fraction of 65 to 70%. He was sent to [facility] medical center for treatment and underwent successful transseptal transcatheter valve in ring TMVR (26mm Sapien valve) with LAMPOON (12/19/23) at [facility] medical center.    Today the patient presents to clinic for follow up. Here with wife. A little fatuged trying to build back strength. Groin site took a while to stop oozing but now scabbed over. No CP or SOB. No LE edema, orthopnea or PND. No dizziness or syncope. No blood in stool or urine. No palpitations.       Past Medical History:  Diagnosis Date   Atrial flutter, paroxysmal (HCC)    a. dx 04-02-2014, s/p  successful cardioversion 04-06-2014.   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 05/2007 CABG x 1: s/p SVG-OM;  b. 10/2010 Ex MV: EF 72%, inf attenuation w/o ischemia, brief run of PAT with exercise. To   Stroke (HCC)    Thrombocytopenia (HCC)    Thyroid  goiter 2013   nodular   Wears hearing aid    BILATERAL   Wears partial dentures    UPPER     Current Medications: Current Meds  Medication Sig   amoxicillin  (AMOXIL ) 500 MG tablet Take 4 tablets (2,000 mg total) by mouth as directed. Take 1 hour prior to dental work, including cleanings.   apixaban  (ELIQUIS ) 5 MG TABS tablet TAKE 1 TABLET BY MOUTH TWICE  DAILY   Ascorbic Acid (VITAMIN C PO) Take 500 mg by mouth in the  morning. Gummies 250 mg each   atorvastatin  (LIPITOR) 20 MG tablet Take 40 mg by mouth every evening.   diltiazem  (CARDIZEM ) 30 MG tablet Take 1 tablet (30 mg total) by mouth daily as needed.   Eyelid Cleansers (OCUSOFT EYELID CLEANSING) PADS Place 1 application into the left eye in the morning.   ezetimibe  (ZETIA ) 10 MG tablet TAKE 1 TABLET BY MOUTH IN THE  EVENING   nitroGLYCERIN  (NITROSTAT ) 0.4 MG SL tablet Place 1 tablet (0.4 mg total) under the tongue every 5 (five) minutes as needed.   [DISCONTINUED] losartan  (COZAAR ) 25 MG tablet TAKE 1 TABLET(25 MG) BY MOUTH EVERY EVENING (Patient taking differently: Take 12.5 mg by mouth at bedtime.)   [DISCONTINUED] metoprolol  tartrate (LOPRESSOR ) 25 MG tablet Take 3 tablets (75 mg total) by mouth 2 (two) times daily. (Patient taking differently: Take 50 mg by mouth 2 (two) times daily.)   [DISCONTINUED] spironolactone  (ALDACTONE ) 25 MG tablet TAKE 1 TABLET(25 MG) BY MOUTH EVERY MORNING (Patient taking differently: Take 12.5 mg by mouth in the morning.)      ROS:   Please see the history of present illness.    All other systems reviewed and are negative.  EKGs       Risk Assessment/Calculations:    CHA2DS2-VASc Score = 6   This indicates a 9.7% annual risk of stroke. The patient's score is based upon: CHF History: 0 HTN History: 0 Diabetes History: 0 Stroke History: 2 Vascular Disease History: 1 Age Score: 2 Gender Score: 0          Physical Exam:    VS:  BP 104/68   Pulse 84   Ht 6' (1.829 m)   Wt 174 lb (78.9 kg)   SpO2 97%   BMI 23.60 kg/m     Wt Readings from Last 3 Encounters:  12/30/23 174 lb (78.9 kg)  11/20/23 180 lb (81.6 kg)  09/23/23 182 lb (82.6 kg)     GEN: Well nourished, well developed in no acute distress NECK: No JVD CARDIAC: irreg irreg, no murmurs, rubs, gallops RESPIRATORY:  Clear to auscultation without rales, wheezing or rhonchi  ABDOMEN: Soft, non-tender, non-distended EXTREMITIES:  No  edema; No deformity.  Groin sites healing well, right with ecchymosis and a scab.  ASSESSMENT:    1. S/P transcatheter mitral valve replacement (TMVR)   2. Chronic diastolic congestive heart failure (HCC)   3. Primary hypertension   4. Permanent atrial fibrillation (HCC)   5. Coronary artery disease involving coronary bypass graft of native heart without angina pectoris   6. Deep vein thrombosis (DVT) of distal vein of lower extremity, unspecified chronicity,  unspecified laterality (HCC)     PLAN:    In order of problems listed above:  Severe bioprosthetic MR s/p transseptal transcatheter valve in ring TMVR (26mm Sapien valve) with LAMPOON (12/19/23) at [facility] medical center: -- Pt doing well s/p TMVR. -- Groin sites healing well.  -- SBE discussed. He has amoxicillin .  -- Continue Eliquis  5mg  BID. -- Cleared to resume all activities without restriction. -- I will see back for 1 month echo and OV.  HFpEF: -- Appears euvolemic. -- Continue Lasix  20mg  daily.  HTN: -- BP well controlled. -- Continue Lopressor  50mg  BID, losartan  12.5mg  daily, spiro 12.5mg  daily and Lasix  20mg  daily.  Persistent atrial fibrillation: -- Continue Lopressor  50mg  BID. -- Continue Cardizem  30mg  PRN for palpitations.  -- Continue Eliquis  5mg  BID.  CAD: -- No aspirin  given OAC. -- Continue Lipitor 20mg  daily.   Prior DVT: -- Continue Eliquis  5mg  BID.    Medication Adjustments/Labs and Tests Ordered: Current medicines are reviewed at length with the patient today.  Concerns regarding medicines are outlined above.  No orders of the defined types were placed in this encounter.  Meds ordered this encounter  Medications   diltiazem  (CARDIZEM ) 30 MG tablet    Sig: Take 1 tablet (30 mg total) by mouth daily as needed.    Dispense:  90 tablet    Refill:  0    INSTRUCTIONS UPDATED   losartan  (COZAAR ) 25 MG tablet    Sig: Take 0.5 tablets (12.5 mg total) by mouth at bedtime.    Dispense:  45  tablet    Refill:  1    INSTRUCTIONS UPDATED   metoprolol  tartrate (LOPRESSOR ) 25 MG tablet    Sig: Take 2 tablets (50 mg total) by mouth 2 (two) times daily.    Dispense:  180 tablet    Refill:  1    INSTRUCTIONS UPDATED   spironolactone  (ALDACTONE ) 25 MG tablet    Sig: Take 0.5 tablets (12.5 mg total) by mouth in the morning.    Dispense:  45 tablet    Refill:  1    INSTRUCTIONS UPDATED    Patient Instructions  Medication Instructions:  CHANGE Diltiazem  to 30mg  take 1 tablet once a day as needed CHANGE Losartan  to 12.5mg  Take 1 tablet once a day CHANGE Metoprolol  Tartrate to 50mg  Take 1 tablet twice a day CHANGE Spironolactone  to 12.5mg  Take 1 tablet once a day  *If you need a refill on your cardiac medications before your next appointment, please call your pharmacy*  Lab Work: None ordered If you have labs (blood work) drawn today and your tests are completely normal, you will receive your results only by: MyChart Message (if you have MyChart) OR A paper copy in the mail If you have any lab test that is abnormal or we need to change your treatment, we will call you to review the results.  Testing/Procedures: None ordered  Follow-Up: At [facility], you and your health needs are our priority.  As part of our continuing mission to provide you with exceptional heart care, our providers are all part of one team.  This team includes your primary Cardiologist (physician) and Advanced Practice Providers or APPs (Physician Assistants and Nurse Practitioners) who all work together to provide you with the care you need, when you need it.  Your next appointment:   AS SCHEDULED   Provider:   Izetta Hummer, PA-C    We recommend signing up for the patient portal called MyChart.  Sign  up information is provided on this After Visit Summary.  MyChart is used to connect with patients for Virtual Visits (Telemedicine).  Patients are able to view lab/test results, encounter  notes, upcoming appointments, etc.  Non-urgent messages can be sent to your provider as well.   To learn more about what you can do with MyChart, go to [email or web address].   Other Instructions        Signed, Lamarr Hummer, PA-C  12/30/2023 2:16 PM    [facility]

## 2023-12-30 NOTE — Patient Instructions (Signed)
 Medication Instructions:  CHANGE Diltiazem  to 30mg  take 1 tablet once a day as needed CHANGE Losartan  to 12.5mg  Take 1 tablet once a day CHANGE Metoprolol  Tartrate to 50mg  Take 1 tablet twice a day CHANGE Spironolactone  to 12.5mg  Take 1 tablet once a day  *If you need a refill on your cardiac medications before your next appointment, please call your pharmacy*  Lab Work: None ordered If you have labs (blood work) drawn today and your tests are completely normal, you will receive your results only by: MyChart Message (if you have MyChart) OR A paper copy in the mail If you have any lab test that is abnormal or we need to change your treatment, we will call you to review the results.  Testing/Procedures: None ordered  Follow-Up: At [facility], you and your health needs are our priority.  As part of our continuing mission to provide you with exceptional heart care, our providers are all part of one team.  This team includes your primary Cardiologist (physician) and Advanced Practice Providers or APPs (Physician Assistants and Nurse Practitioners) who all work together to provide you with the care you need, when you need it.  Your next appointment:   AS SCHEDULED   Provider:   Izetta Hummer, PA-C    We recommend signing up for the patient portal called MyChart.  Sign up information is provided on this After Visit Summary.  MyChart is used to connect with patients for Virtual Visits (Telemedicine).  Patients are able to view lab/test results, encounter notes, upcoming appointments, etc.  Non-urgent messages can be sent to your provider as well.   To learn more about what you can do with MyChart, go to [email or web address].   Other Instructions

## 2024-01-07 LAB — COMPREHENSIVE METABOLIC PANEL WITH GFR: EGFR: 58

## 2024-01-08 ENCOUNTER — Other Ambulatory Visit (HOSPITAL_COMMUNITY): Payer: Self-pay | Admitting: Family Medicine

## 2024-01-08 ENCOUNTER — Ambulatory Visit (HOSPITAL_COMMUNITY)
Admission: RE | Admit: 2024-01-08 | Discharge: 2024-01-08 | Disposition: A | Discharge status: Home / Self Care | Source: Ambulatory Visit | Attending: Family Medicine | Admitting: Family Medicine

## 2024-01-08 DIAGNOSIS — K831 Obstruction of bile duct: Secondary | ICD-10-CM | POA: Diagnosis not present

## 2024-01-08 DIAGNOSIS — R7989 Other specified abnormal findings of blood chemistry: Secondary | ICD-10-CM | POA: Diagnosis not present

## 2024-01-09 ENCOUNTER — Other Ambulatory Visit (HOSPITAL_COMMUNITY): Payer: Self-pay | Admitting: Family Medicine

## 2024-01-09 DIAGNOSIS — R17 Unspecified jaundice: Secondary | ICD-10-CM

## 2024-01-09 DIAGNOSIS — K838 Other specified diseases of biliary tract: Secondary | ICD-10-CM

## 2024-01-10 ENCOUNTER — Inpatient Hospital Stay (HOSPITAL_COMMUNITY)
Admission: EM | Admit: 2024-01-10 | Discharge: 2024-01-16 | DRG: 445 | Disposition: A | Discharge status: Home / Self Care | Attending: Family Medicine | Admitting: Family Medicine

## 2024-01-10 ENCOUNTER — Other Ambulatory Visit (HOSPITAL_COMMUNITY): Payer: Self-pay | Admitting: Family Medicine

## 2024-01-10 ENCOUNTER — Ambulatory Visit (HOSPITAL_COMMUNITY)
Admission: RE | Admit: 2024-01-10 | Discharge: 2024-01-10 | Disposition: A | Discharge status: Home / Self Care | Source: Ambulatory Visit | Attending: Family Medicine | Admitting: Family Medicine

## 2024-01-10 ENCOUNTER — Other Ambulatory Visit: Payer: Self-pay

## 2024-01-10 ENCOUNTER — Encounter (HOSPITAL_COMMUNITY): Payer: Self-pay | Admitting: *Deleted

## 2024-01-10 DIAGNOSIS — Z9079 Acquired absence of other genital organ(s): Secondary | ICD-10-CM

## 2024-01-10 DIAGNOSIS — D6869 Other thrombophilia: Secondary | ICD-10-CM | POA: Diagnosis present

## 2024-01-10 DIAGNOSIS — R7989 Other specified abnormal findings of blood chemistry: Secondary | ICD-10-CM

## 2024-01-10 DIAGNOSIS — K838 Other specified diseases of biliary tract: Secondary | ICD-10-CM | POA: Insufficient documentation

## 2024-01-10 DIAGNOSIS — Z974 Presence of external hearing-aid: Secondary | ICD-10-CM

## 2024-01-10 DIAGNOSIS — Z8546 Personal history of malignant neoplasm of prostate: Secondary | ICD-10-CM

## 2024-01-10 DIAGNOSIS — R17 Unspecified jaundice: Secondary | ICD-10-CM | POA: Insufficient documentation

## 2024-01-10 DIAGNOSIS — Z85820 Personal history of malignant melanoma of skin: Secondary | ICD-10-CM

## 2024-01-10 DIAGNOSIS — Z9842 Cataract extraction status, left eye: Secondary | ICD-10-CM

## 2024-01-10 DIAGNOSIS — Z86711 Personal history of pulmonary embolism: Secondary | ICD-10-CM

## 2024-01-10 DIAGNOSIS — Z8673 Personal history of transient ischemic attack (TIA), and cerebral infarction without residual deficits: Secondary | ICD-10-CM

## 2024-01-10 DIAGNOSIS — Z1152 Encounter for screening for COVID-19: Secondary | ICD-10-CM

## 2024-01-10 DIAGNOSIS — I493 Ventricular premature depolarization: Secondary | ICD-10-CM | POA: Diagnosis present

## 2024-01-10 DIAGNOSIS — Z9841 Cataract extraction status, right eye: Secondary | ICD-10-CM

## 2024-01-10 DIAGNOSIS — I2510 Atherosclerotic heart disease of native coronary artery without angina pectoris: Secondary | ICD-10-CM | POA: Diagnosis present

## 2024-01-10 DIAGNOSIS — D696 Thrombocytopenia, unspecified: Secondary | ICD-10-CM | POA: Diagnosis present

## 2024-01-10 DIAGNOSIS — E785 Hyperlipidemia, unspecified: Secondary | ICD-10-CM | POA: Diagnosis present

## 2024-01-10 DIAGNOSIS — Z79899 Other long term (current) drug therapy: Secondary | ICD-10-CM

## 2024-01-10 DIAGNOSIS — I110 Hypertensive heart disease with heart failure: Secondary | ICD-10-CM | POA: Diagnosis present

## 2024-01-10 DIAGNOSIS — D649 Anemia, unspecified: Secondary | ICD-10-CM | POA: Diagnosis present

## 2024-01-10 DIAGNOSIS — Z9889 Other specified postprocedural states: Secondary | ICD-10-CM

## 2024-01-10 DIAGNOSIS — N179 Acute kidney failure, unspecified: Secondary | ICD-10-CM | POA: Diagnosis present

## 2024-01-10 DIAGNOSIS — I4821 Permanent atrial fibrillation: Secondary | ICD-10-CM | POA: Diagnosis present

## 2024-01-10 DIAGNOSIS — Z961 Presence of intraocular lens: Secondary | ICD-10-CM | POA: Diagnosis present

## 2024-01-10 DIAGNOSIS — Z952 Presence of prosthetic heart valve: Secondary | ICD-10-CM

## 2024-01-10 DIAGNOSIS — Z86718 Personal history of other venous thrombosis and embolism: Secondary | ICD-10-CM

## 2024-01-10 DIAGNOSIS — Z7901 Long term (current) use of anticoagulants: Secondary | ICD-10-CM

## 2024-01-10 DIAGNOSIS — Z951 Presence of aortocoronary bypass graft: Secondary | ICD-10-CM

## 2024-01-10 DIAGNOSIS — Z953 Presence of xenogenic heart valve: Secondary | ICD-10-CM

## 2024-01-10 DIAGNOSIS — K831 Obstruction of bile duct: Secondary | ICD-10-CM | POA: Diagnosis not present

## 2024-01-10 DIAGNOSIS — I5032 Chronic diastolic (congestive) heart failure: Secondary | ICD-10-CM | POA: Diagnosis present

## 2024-01-10 DIAGNOSIS — Z8551 Personal history of malignant neoplasm of bladder: Secondary | ICD-10-CM

## 2024-01-10 DIAGNOSIS — I080 Rheumatic disorders of both mitral and aortic valves: Secondary | ICD-10-CM | POA: Diagnosis present

## 2024-01-10 LAB — PROTIME-INR
INR: 1.6 — ABNORMAL HIGH (ref 0.8–1.2)
Prothrombin Time: 19.8 s — ABNORMAL HIGH (ref 11.4–15.2)

## 2024-01-10 LAB — COMPREHENSIVE METABOLIC PANEL WITH GFR
ALT: 89 U/L — ABNORMAL HIGH (ref 0–44)
AST: 104 U/L — ABNORMAL HIGH (ref 15–41)
Albumin: 3.8 g/dL (ref 3.5–5.0)
Alkaline Phosphatase: 132 U/L — ABNORMAL HIGH (ref 38–126)
Anion gap: 9 (ref 5–15)
BUN: 27 mg/dL — ABNORMAL HIGH (ref 8–23)
CO2: 20 mmol/L — ABNORMAL LOW (ref 22–32)
Calcium: 9.3 mg/dL (ref 8.9–10.3)
Chloride: 108 mmol/L (ref 98–111)
Creatinine, Ser: 1.23 mg/dL (ref 0.61–1.24)
EGFR: 51
GFR, Estimated: 55 mL/min — ABNORMAL LOW (ref >60)
Glucose, Bld: 136 mg/dL — ABNORMAL HIGH (ref 70–99)
Potassium: 3.7 mmol/L (ref 3.5–5.1)
Sodium: 137 mmol/L (ref 135–145)
Total Bilirubin: 8.1 mg/dL — ABNORMAL HIGH (ref 0.0–1.2)
Total Protein: 6.9 g/dL (ref 6.5–8.1)

## 2024-01-10 LAB — TYPE AND SCREEN
ABO/RH(D): A POS
Antibody Screen: NEGATIVE

## 2024-01-10 LAB — CBC
HCT: 33.8 % — ABNORMAL LOW (ref 39.0–52.0)
Hemoglobin: 10.9 g/dL — ABNORMAL LOW (ref 13.0–17.0)
MCH: 32.2 pg (ref 26.0–34.0)
MCHC: 32.2 g/dL (ref 30.0–36.0)
MCV: 99.7 fL (ref 80.0–100.0)
Platelets: 130 K/uL — ABNORMAL LOW (ref 150–400)
RBC: 3.39 MIL/uL — ABNORMAL LOW (ref 4.22–5.81)
RDW: 14.3 % (ref 11.5–15.5)
WBC: 4.7 K/uL (ref 4.0–10.5)
nRBC: 0 % (ref 0.0–0.2)

## 2024-01-10 LAB — APTT: aPTT: 34 s (ref 24–36)

## 2024-01-10 LAB — LIPASE, BLOOD: Lipase: 57 U/L — ABNORMAL HIGH (ref 11–51)

## 2024-01-10 LAB — LACTATE DEHYDROGENASE: LDH: 890 U/L — ABNORMAL HIGH (ref 98–192)

## 2024-01-10 LAB — LACTIC ACID, PLASMA: Lactic Acid, Venous: 1.2 mmol/L (ref 0.5–1.9)

## 2024-01-10 MED ORDER — METOPROLOL TARTRATE 25 MG PO TABS
50.0000 mg | ORAL_TABLET | Freq: Two times a day (BID) | ORAL | Status: DC
  Administered 2024-01-11 – 2024-01-16 (×11): 50 mg via ORAL
  Filled 2024-01-10 (×11): qty 2

## 2024-01-10 MED ORDER — GADOBUTROL 1 MMOL/ML IV SOLN
7.5000 mL | Freq: Once | INTRAVENOUS | Status: AC | PRN
  Administered 2024-01-10: 7.5 mL via INTRAVENOUS

## 2024-01-10 MED ORDER — ORAL CARE MOUTH RINSE: 15.0000 mL | OROMUCOSAL | Status: DC | PRN

## 2024-01-10 NOTE — ED Notes (Signed)
 Hourly rounding complete. Pt alert or resting, no distress noted, offered toileting and diet as appropriate. Side rails up, call light within reach. Pt denies pain or further needs at this time.

## 2024-01-10 NOTE — ED Triage Notes (Signed)
 The pt  had a valve replaced in hsi heart July 31st  hislabs have been elevated  he had a ultrasound performed yesterday and labs drawn  he just had a mri and his labs from yesterday are elevated

## 2024-01-10 NOTE — ED Provider Notes (Signed)
 [facility] Provider Note   CSN: [id number] Arrival date & time: 01/10/24  1621     Patient presents with: labs high   Eric [name] is a 89 y.o. male past medical history significant for severe mitral regurgitation status post mitral valve repair and TAVR, HFpEF with recent EF 60 to 65%, paroxysmal A-fib on Eliquis  on lifelong endocarditis prophylaxis (amoxicillin ) followed by cardiology in outpatient setting who presents emergency department for abnormal labs.  Patient is companied by his wife who does not provide medical history.  Patient states that approximately 3 weeks ago he had a valve replacement.  Patient was doing well and was following up in outpatient setting for a routine dermatologic evaluation.  Patient's dermatologist noticed that the patient was jaundiced.  Laboratory studies were obtained with elevations in T. bili and LFTs.  Patient underwent MRCP with concerns for new soft tissue mass.  Patient was advised to follow-up in the emergency department.  Patient denies abdominal pain, nausea, vomiting, hematemesis, hematochezia or melena.  Patient denies B symptoms including fever, night sweats, unintentional weight loss.  Patient does endorse discoloration of urine to a dark color and endorses skin yellowing.   HPI     Prior to Admission medications   Medication Sig Start Date End Date Taking? Authorizing Provider  amoxicillin  (AMOXIL ) 500 MG tablet Take 4 tablets (2,000 mg total) by mouth as directed. Take 1 hour prior to dental work, including cleanings. 04/27/19   Dann Candyce RAMAN, MD  apixaban  (ELIQUIS ) 5 MG TABS tablet TAKE 1 TABLET BY MOUTH TWICE  DAILY 10/08/23   Cooper, Michael, MD  Ascorbic Acid (VITAMIN C PO) Take 500 mg by mouth in the morning. Gummies 250 mg each    [provider]  atorvastatin  (LIPITOR) 20 MG tablet Take 40 mg by mouth every evening.    [provider]  diltiazem  (CARDIZEM ) 30 MG  tablet Take 1 tablet (30 mg total) by mouth daily as needed. 12/30/23   Sebastian Lamarr SAUNDERS, PA-C  Eyelid Cleansers (OCUSOFT EYELID CLEANSING) PADS Place 1 application into the left eye in the morning.    [provider]  ezetimibe  (ZETIA ) 10 MG tablet TAKE 1 TABLET BY MOUTH IN THE  EVENING 12/27/23   Wonda Sharper, MD  furosemide  (LASIX ) 20 MG tablet Take 1 tablet (20 mg total) by mouth daily. 08/09/23   Ladona Heinz, MD  losartan  (COZAAR ) 25 MG tablet Take 0.5 tablets (12.5 mg total) by mouth at bedtime. 12/30/23   Sebastian Lamarr SAUNDERS, PA-C  metoprolol  tartrate (LOPRESSOR ) 25 MG tablet Take 2 tablets (50 mg total) by mouth 2 (two) times daily. 12/30/23   Sebastian Lamarr SAUNDERS, PA-C  nitroGLYCERIN  (NITROSTAT ) 0.4 MG SL tablet Place 1 tablet (0.4 mg total) under the tongue every 5 (five) minutes as needed. 02/04/23 12/30/23  Dann Candyce RAMAN, MD  spironolactone  (ALDACTONE ) 25 MG tablet Take 0.5 tablets (12.5 mg total) by mouth in the morning. 12/30/23   Sebastian Lamarr SAUNDERS, PA-C    Allergies: Patient has no known allergies.    Review of Systems  Updated Vital Signs BP 108/62   Pulse 75   Temp 98.7 F (37.1 C)   Resp 18   Ht 6' (1.829 m)   Wt 78.9 kg   SpO2 99%   BMI 23.59 kg/m   Physical Exam Vitals reviewed.  Constitutional:      General: He is not in acute distress.    Comments: jaundiced  HENT:  Head: Normocephalic.  Eyes:     General: Scleral icterus present.  Cardiovascular:     Rate and Rhythm: Normal rate.     Pulses:          Radial pulses are 2+ on the right side and 2+ on the left side.     Heart sounds: Murmur heard.  Pulmonary:     Effort: Pulmonary effort is normal. No tachypnea.  Abdominal:     General: There is no distension.     Palpations: Abdomen is soft. There is no mass.     Tenderness: There is no abdominal tenderness.  Musculoskeletal:     Cervical back: Full passive range of motion without pain and normal range of motion.     Right lower  leg: No edema.     Left lower leg: No edema.     Comments: Spontaneous movement of bilateral upper and lower extremities  Skin:    General: Skin is warm.     Capillary Refill: Capillary refill takes less than 2 seconds.     Coloration: Skin is jaundiced.  Neurological:     Mental Status: He is alert.     (all labs ordered are listed, but only abnormal results are displayed) Labs Reviewed  LIPASE, BLOOD - Abnormal; Notable for the following components:      Result Value   Lipase 57 (*)    All other components within normal limits  COMPREHENSIVE METABOLIC PANEL WITH GFR - Abnormal; Notable for the following components:   CO2 20 (*)    Glucose, Bld 136 (*)    BUN 27 (*)    AST 104 (*)    ALT 89 (*)    Alkaline Phosphatase 132 (*)    Total Bilirubin 8.1 (*)    GFR, Estimated 55 (*)    All other components within normal limits  CBC - Abnormal; Notable for the following components:   RBC 3.39 (*)    Hemoglobin 10.9 (*)    HCT 33.8 (*)    Platelets 130 (*)    All other components within normal limits  PROTIME-INR - Abnormal; Notable for the following components:   Prothrombin Time 19.8 (*)    INR 1.6 (*)    All other components within normal limits  LACTATE DEHYDROGENASE - Abnormal; Notable for the following components:   LDH 890 (*)    All other components within normal limits  APTT  URINALYSIS, ROUTINE W REFLEX MICROSCOPIC  LACTIC ACID, PLASMA  LACTIC ACID, PLASMA  TYPE AND SCREEN    EKG: None  Radiology: MR ABDOMEN MRCP W WO CONTAST Result Date: 01/10/2024 CLINICAL DATA:  Dilated bile ducts. EXAM: MRI ABDOMEN WITHOUT AND WITH CONTRAST (INCLUDING MRCP) TECHNIQUE: Multiplanar multisequence MR imaging of the abdomen was performed both before and after the administration of intravenous contrast. Heavily T2-weighted images of the biliary and pancreatic ducts were obtained, and three-dimensional MRCP images were rendered by post processing. CONTRAST:  7.5mL GADAVIST   GADOBUTROL  1 MMOL/ML IV SOLN COMPARISON:  Multiple priors including CTA chest Oct 15, 2022 and CT abdomen pelvis August 23, 2010. FINDINGS: Lower chest: Trace right pleural effusion. Hepatobiliary: Diffuse hepatic steatosis. No suspicious hepatic lesion. Distended gallbladder.  No evidence of acute cholecystitis. Marked dilation of the common bile duct with mild prominence of the central intrahepatic biliary tree. Common duct measures up to 2.1 cm in diameter. No discrete choledocholithiasis. Question of a soft tissue mass along the duodenal wall and distal common duct on image 28/12 with  hypoenhancement in this area on postcontrast image 73/1001. DWI and ADC sequences are significantly limited by motion. Pancreas: No pancreatic ductal dilation or evidence of acute inflammation. Spleen:  No splenomegaly or focal splenic lesion. Adrenals/Urinary Tract: No suspicious adrenal nodule/mass. No hydronephrosis. Stomach/Bowel: Visualized portions within the abdomen are unremarkable. Vascular/Lymphatic: No pathologically enlarged lymph nodes identified. No abdominal aortic aneurysm demonstrated. Other:  No significant abdominal free fluid. Musculoskeletal: No suspicious bone lesions identified. IMPRESSION: Motion degraded examination limits sensitivity and specificity. Within this context: 1. Marked dilation of the common bile duct with mild prominence of the central intrahepatic biliary tree. No discrete choledocholithiasis. Question of a soft tissue mass along the duodenal wall and distal common duct with hypoenhancement in this area on postcontrast sequence. DWI and ADC sequences are significantly limited by motion. Recommend further evaluation with ERCP. 2. Gallbladder is distended without evidence of acute cholecystitis. 3. Diffuse hepatic steatosis. 4. Trace right pleural effusion. Electronically Signed   By: Reyes Holder M.D.   On: 01/10/2024 14:09   MR 3D Recon At Scanner Result Date: 01/10/2024 CLINICAL DATA:   Dilated bile ducts. EXAM: MRI ABDOMEN WITHOUT AND WITH CONTRAST (INCLUDING MRCP) TECHNIQUE: Multiplanar multisequence MR imaging of the abdomen was performed both before and after the administration of intravenous contrast. Heavily T2-weighted images of the biliary and pancreatic ducts were obtained, and three-dimensional MRCP images were rendered by post processing. CONTRAST:  7.5mL GADAVIST  GADOBUTROL  1 MMOL/ML IV SOLN COMPARISON:  Multiple priors including CTA chest Oct 15, 2022 and CT abdomen pelvis August 23, 2010. FINDINGS: Lower chest: Trace right pleural effusion. Hepatobiliary: Diffuse hepatic steatosis. No suspicious hepatic lesion. Distended gallbladder.  No evidence of acute cholecystitis. Marked dilation of the common bile duct with mild prominence of the central intrahepatic biliary tree. Common duct measures up to 2.1 cm in diameter. No discrete choledocholithiasis. Question of a soft tissue mass along the duodenal wall and distal common duct on image 28/12 with hypoenhancement in this area on postcontrast image 73/1001. DWI and ADC sequences are significantly limited by motion. Pancreas: No pancreatic ductal dilation or evidence of acute inflammation. Spleen:  No splenomegaly or focal splenic lesion. Adrenals/Urinary Tract: No suspicious adrenal nodule/mass. No hydronephrosis. Stomach/Bowel: Visualized portions within the abdomen are unremarkable. Vascular/Lymphatic: No pathologically enlarged lymph nodes identified. No abdominal aortic aneurysm demonstrated. Other:  No significant abdominal free fluid. Musculoskeletal: No suspicious bone lesions identified. IMPRESSION: Motion degraded examination limits sensitivity and specificity. Within this context: 1. Marked dilation of the common bile duct with mild prominence of the central intrahepatic biliary tree. No discrete choledocholithiasis. Question of a soft tissue mass along the duodenal wall and distal common duct with hypoenhancement in this area on  postcontrast sequence. DWI and ADC sequences are significantly limited by motion. Recommend further evaluation with ERCP. 2. Gallbladder is distended without evidence of acute cholecystitis. 3. Diffuse hepatic steatosis. 4. Trace right pleural effusion. Electronically Signed   By: Reyes Holder M.D.   On: 01/10/2024 14:09     Procedures   Medications Ordered in the ED - No data to display  Clinical Course as of 01/10/24 1918  Fri Jan 10, 2024  1721 GI consult placed [AG]  1753 Hospitalist consult placed [AG]    Clinical Course User Index [AG] Nada Chroman, DO                                 Medical Decision Making Amount and/or  Complexity of Data Reviewed Labs: ordered.  Risk Decision regarding hospitalization.   On initial evaluation patient is hemodynamically stable, afebrile and not in acute distress.  Outpatient laboratory and imaging studies reviewed.  Based upon patient's history and physical examination findings differential diagnosis includes acute cholecystitis, acute cholangitis, choledocholithiasis, hemolysis, acute liver failure.  Will obtain laboratory studies and consult gastroenterology based upon patient's MRCP findings with new soft tissue mass causing obstructive pathology with increased total bilirubin and LFTs and associated jaundice.  Laboratory studies significant for elevated LFTs with 2 point hemoglobin drop compared to 2 months prior.  INR elevated at 1.6, LDH elevated.  Patient's presentation less likely acute cholecystitis, choledocholithiasis or cholangitis as patient has no abdominal pain, is afebrile without leukocytosis. Gastroenterology with recommendations to admit patient in order to obtain biopsy of the soft tissue mass.  No acute intervention necessary during evaluation in the emergency department.  Patient will be admitted to hospitalist team for continued care and management.  At the time of admission patient understood and agreed with plan of care  and all questions were answered     Final diagnoses:  Elevated bilirubin  Elevated LFTs    ED Discharge Orders     None       Lavanda Bolster DO Emergency Medicine PGY2    Bolster Lavanda, DO 01/10/24 1919    Randol Simmonds, MD 01/11/24 [phone]

## 2024-01-10 NOTE — H&P (Signed)
 History and Physical    Patient: Eric [name] FMW:[id number] DOB: [birth year] DOA: 01/10/2024 DOS: the patient was seen and examined on 01/11/2024 PCP: Rolinda Millman, MD  Patient coming from: Home  Chief Complaint:  Chief Complaint  Patient presents with   labs high   HPI: Eric [name] is a 89 y.o. male with a history of persistent AFib, bladder CA s/p TURBT, remote DVT (2000), stroke, CAD s/p 1v CABG and bioprosthetic AVR and MVR 2009, then valve-in-valve TAVR 2020, then transseptal transcatheter valve in ring TMVR with LAMPOON at [facility] 12/19/2023 who presented to the ED today on the advice of his PCP due to abnormal MRCP. He had been recovering well from [facility], presented to routine dermatology visit earlier this week, was noted to be jaundiced, labs confirmed hyperbilirubinemia and subsequent MRCP performed this morning showed concern for an obstructive soft tissue mass. ERCP was recommended and pt told to proceed to ED. [facility] was consulted by EDP, will consult in AM.   He reports mild upper abdominal discomfort that was fleeting but none now. Has darker urine of late. Limited appetite of late but no nausea or vomiting, normal BMs. No unintentional weight loss, fevers, night sweats.    Review of Systems: As mentioned in the history of present illness. All other systems reviewed and are negative. Past Medical History:  Diagnosis Date   Atrial flutter, paroxysmal (HCC)    a. dx 04-02-2014, s/p  successful cardioversion 04-06-2014.   Chronic anticoagulation    on Eliquis --  due to recurrent dvt's and pe's   Dyslipidemia    History of bladder cancer urologist-  dr chauncey   02-13-2016  s/p TURBT per path high grade papillary urothelial carcinoma   History of DVT (deep vein thrombosis)    2000-- RLE   History of melanoma excision    left flank;  07/ 2014 left lower leg and right upper chest   History of prostate cancer    Gleason 6--  s/p  radical prostatectomy 06/ 2000 in  [place], [place]---  no recurrence   History of pulmonary embolus (PE)    2000 & 2005  bilateral   Hx of valvuloplasty 06/19/2007   a. 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 05/2007 CABG x 1: s/p SVG-OM;  b. 10/2010 Ex MV: EF 72%, inf attenuation w/o ischemia, brief run of PAT with exercise. To   Stroke (HCC)    Thrombocytopenia (HCC)    Thyroid  goiter 2013   nodular   Wears hearing aid    BILATERAL   Wears partial dentures    UPPER   Past Surgical History:  Procedure Laterality Date   AORTIC VALVE REPLACEMENT (AVR)/CORONARY ARTERY BYPASS GRAFTING (CABG)  06-19-2007  dr lucas   SVG to OM1 ;   AVR w/ #23 Mitralflow pericardial and ligation left atrial appendage;  MV repair w/ #28 Sorin 3-D memo Ring Annuloplasty with repair ruptured chordar of P-2 flail segments   CARDIAC CATHETERIZATION  05-12-2007  dr malva   single vessel 30% ostial LAD,  80% LCx;  severe to critial AS,  moderate MR,  normal LVF, ef 70%,  normal right heart pressures and cardiac outputs,  mild elevated LV end-diastolic pressure  CARDIOVASCULAR STRESS TEST  11-02-2010  dr dann   normal nuclear study w/ no ischemia or infarct/scar/  normal LV function and wall motion , ef 72%   CARDIOVERSION N/A 04/06/2014   Procedure: CARDIOVERSION;  Surgeon: Leim VEAR Moose, MD;  Location: [facility] ENDOSCOPY;  Service: Cardiovascular;  Laterality: N/A;   successful   CATARACT EXTRACTION W/ INTRAOCULAR LENS  IMPLANT, BILATERAL  2012  approx   CORONARY ARTERY BYPASS GRAFT     PROSTATECTOMY     RETROPUBIC RADICAL PROSTATECTOMY  06/ 2000  in [place], [place]   RIGHT/LEFT HEART CATH AND CORONARY/GRAFT ANGIOGRAPHY N/A 10/29/2018   Procedure: RIGHT/LEFT HEART CATH AND CORONARY/GRAFT ANGIOGRAPHY;   Surgeon: Wonda Sharper, MD;  Location: [facility] INVASIVE CV LAB;  Service: Cardiovascular;  Laterality: N/A;   RIGHT/LEFT HEART CATH AND CORONARY/GRAFT ANGIOGRAPHY N/A 10/04/2023   Procedure: RIGHT/LEFT HEART CATH AND CORONARY/GRAFT ANGIOGRAPHY;  Surgeon: Wonda Sharper, MD;  Location: [facility] INVASIVE CV LAB;  Service: Cardiovascular;  Laterality: N/A;   ROTATOR CUFF REPAIR Left 09/1998   TEE WITHOUT CARDIOVERSION N/A 04/06/2014   Procedure: TRANSESOPHAGEAL ECHOCARDIOGRAM (TEE);  Surgeon: Leim VEAR Moose, MD;  Location[facility] ENDOSCOPY;  Service: Cardiovascular;  Laterality: N/A;  mild focal basa LVH of the septum, ef 50-55%, s/p AV annuloplasty ring, elongated chordae, thickened leaflets, mild to mod. MR, multiple small jets/arotic bioprosthetic valve sits well in position, mild AI/ thickened TV w/ mild reurg./mild LA   TEE WITHOUT CARDIOVERSION N/A 10/22/2018   Procedure: TRANSESOPHAGEAL ECHOCARDIOGRAM (TEE);  Surgeon: Jeffrie Oneil BROCKS, MD;  Location: [facility] ENDOSCOPY;  Service: Cardiovascular;  Laterality: N/A;   TEE WITHOUT CARDIOVERSION N/A 11/25/2018   Procedure: TRANSESOPHAGEAL ECHOCARDIOGRAM (TEE);  Surgeon: Wonda Sharper, MD;  Location: [facility] OR;  Service: Open Heart Surgery;  Laterality: N/A;   TRANSCATHETER AORTIC VALVE REPLACEMENT, TRANSFEMORAL N/A 11/25/2018   Procedure: TRANSCATHETER AORTIC VALVE REPLACEMENT, TRANSFEMORAL;  Surgeon: Wonda Sharper, MD;  Location: [facility] OR;  Service: Open Heart Surgery;  Laterality: N/A;   TRANSESOPHAGEAL ECHOCARDIOGRAM (CATH LAB) N/A 10/04/2023   Procedure: TRANSESOPHAGEAL ECHOCARDIOGRAM;  Surgeon: Santo Stanly LABOR, MD;  Location: [facility] INVASIVE CV LAB;  Service: Cardiovascular;  Laterality: N/A;   TRANSTHORACIC ECHOCARDIOGRAM  03-23-2016   dr dann   EF 55-60%, reduced contribution atrial contraction to ventricular filling, due to increased ventricular diastolic pressure or atrial contratile dysfunction/  bioprosthetic AV w/ mod. regurg. (valve area 1.97cm^2)/  mild dilated ascending aorta/ mild thicken MV normal function annular ring prosthesis/severe LAE & mod. to sev.RAE/ PASP 65mmHg/ mild TR/ ventricle septal motion show paradox   TRANSURETHRAL RESECTION OF BLADDER TUMOR N/A 02/13/2016   Procedure: TRANSURETHRAL RESECTION OF BLADDER TUMOR (TURBT);  Surgeon: Redell Lynwood Napoleon, MD;  Location: [facility];  Service: Urology;  Laterality: N/A;   TRANSURETHRAL RESECTION OF BLADDER TUMOR N/A 09/04/2016   Procedure: TRANSURETHRAL RESECTION OF BLADDER TUMOR (TURBT);  Surgeon: Redell Lynwood Napoleon, MD;  Location: [facility];  Service: Urology;  Laterality: N/A;   Social History:  reports that he has never smoked. He has never used smokeless tobacco. He reports current alcohol use of about 1.0 - 2.0 standard drink of alcohol per week. He reports that he does not use drugs.  No Known Allergies  Family History  Problem Relation Age of Onset   Leukemia Father    Supraventricular tachycardia Sister    Heart attack Neg Hx    Stroke Neg Hx    Hypertension Neg Hx     Prior to Admission medications   Medication  Sig Start Date End Date Taking? Authorizing Provider  amoxicillin  (AMOXIL ) 500 MG tablet Take 4 tablets (2,000 mg total) by mouth as directed. Take 1 hour prior to dental work, including cleanings. 04/27/19   Dann Candyce RAMAN, MD  apixaban  (ELIQUIS ) 5 MG TABS tablet TAKE 1 TABLET BY MOUTH TWICE  DAILY 10/08/23   Cooper, Michael, MD  Ascorbic Acid (VITAMIN C PO) Take 500 mg by mouth in the morning. Gummies 250 mg each    [provider]  atorvastatin  (LIPITOR) 20 MG tablet Take 40 mg by mouth every evening.    [provider]  diltiazem  (CARDIZEM ) 30 MG tablet Take 1 tablet (30 mg total) by mouth daily as needed. 12/30/23   Sebastian Lamarr SAUNDERS, PA-C  Eyelid Cleansers (OCUSOFT EYELID CLEANSING) PADS Place 1 application into the left eye in the morning.    [provider]  ezetimibe  (ZETIA ) 10 MG  tablet TAKE 1 TABLET BY MOUTH IN THE  EVENING 12/27/23   Wonda Sharper, MD  furosemide  (LASIX ) 20 MG tablet Take 1 tablet (20 mg total) by mouth daily. 08/09/23   Ladona Heinz, MD  losartan  (COZAAR ) 25 MG tablet Take 0.5 tablets (12.5 mg total) by mouth at bedtime. 12/30/23   Sebastian Lamarr SAUNDERS, PA-C  metoprolol  tartrate (LOPRESSOR ) 25 MG tablet Take 2 tablets (50 mg total) by mouth 2 (two) times daily. 12/30/23   Sebastian Lamarr SAUNDERS, PA-C  nitroGLYCERIN  (NITROSTAT ) 0.4 MG SL tablet Place 1 tablet (0.4 mg total) under the tongue every 5 (five) minutes as needed. 02/04/23 12/30/23  Dann Candyce RAMAN, MD  spironolactone  (ALDACTONE ) 25 MG tablet Take 0.5 tablets (12.5 mg total) by mouth in the morning. 12/30/23   Sebastian Lamarr SAUNDERS, PA-C    Physical Exam: Vitals:   01/10/24 1830 01/10/24 1831 01/10/24 2019 01/10/24 2341  BP: 108/62  (!) 159/83 (!) 143/85  Pulse:  75 (!) 44 68  Resp:   18 19  Temp:   97.9 F (36.6 C) 97.9 F (36.6 C)  TempSrc:   Oral Oral  SpO2: 99% 99% 99% 97%  Weight:   81.6 kg   Height:   6' (1.829 m)   Gen: WDWN elderly male appearing younger than stated age HEENT: Hearing aids in place Pulm: Clear, nonlabored  CV: Irreg irreg, rate in 80's, no murmurs (!), no rub or gallop, no JVD or edema GI: Soft, NT, ND, +BS Neuro: Alert and oriented. No new focal deficits. Ext: Warm, no deformities Skin: Diffuse jaundice and conjunctival icterus. No other rashes, lesions or ulcers on visualized skin   Data Reviewed: TBili 8.1, AST 104, ALT 89, lipase 57, alk phos 132.  Cr. 1.23, BUN 27, bicarb 20.  Hgb 10.9, normocytic, plt 130.  LDH 890 Lactic acid 1.2 INR 1.6  RUQ U/S 8/20: CBD dilated to 20mm Motion-degraded MRCP 8/22 PTA: 1. Marked dilation of the common bile duct with mild prominence of the central intrahepatic biliary tree. No discrete choledocholithiasis. Question of a soft tissue mass along the duodenal wall and distal common duct with hypoenhancement in this area  on postcontrast sequence. DWI and ADC sequences are significantly limited by motion. Recommend further evaluation with ERCP. 2. Gallbladder is distended without evidence of acute cholecystitis. 3. Diffuse hepatic steatosis. 4. Trace right pleural effusion.  Assessment and Plan: Soft tissue mass, obstructive hyperbilirubinemia:  - NPO p MN - Trend CMP - GI consulted [facility] PCP), Dr. Nickola, will see in AM.  - Hold eliquis  tonight, though should be paused  for as short of a period as possible with history of remote DVT, AFib with stroke, and artificial valves. Last dose 8/22 AM.  Persistent AFib: Rate controlled.  - Continue metoprolol  50mg  BID  Chronic HFpEF, HTN:  - Continue lasix  and metoprolol . Pending BP trends, will not reorder ARB, spironolactone  at this time.   CAD, HLD: s/p CABG 2009. No anginal complaints.  - Continue beta blocker. On DOAC in lieu of ASA, holding as above. Hold statin/zetia  for now.   Thrombocytopenia:  - Trend in AM.   Normocytic anemia:  - Trend in AM   Advance Care Planning: Full code as per MOST form presented by spouse at bedside  Consults: [facility] consulted by EDP.  Family Communication: Spouse at bedside  Severity of Illness: The appropriate patient status for this patient is OBSERVATION. Observation status is judged to be reasonable and necessary in order to provide the required intensity of service to ensure the patient's safety. The patient's presenting symptoms, physical exam findings, and initial radiographic and laboratory data in the context of their medical condition is felt to place them at decreased risk for further clinical deterioration. Furthermore, it is anticipated that the patient will be medically stable for discharge from the hospital within 2 midnights of admission.   Author: Bernardino KATHEE Come, MD 01/11/2024 12:03 AM  For on call review [email or web address].

## 2024-01-10 NOTE — Plan of Care (Signed)
 Patient arrived to unit accompanied by wife. Placed on telemetry, vitals stable. Call bell within reach and instructed on use. Bed alarm on and bed in low and locked position. Admission questionnaire completed. Awaiting further orders for plan of care.   Problem: Education: Goal: Knowledge of General Education information will improve Description: Including pain rating scale, medication(s)/side effects and non-pharmacologic comfort measures Outcome: Progressing   Problem: Health Behavior/Discharge Planning: Goal: Ability to manage health-related needs will improve Outcome: Progressing   Problem: Clinical Measurements: Goal: Ability to maintain clinical measurements within normal limits will improve Outcome: Progressing

## 2024-01-11 ENCOUNTER — Encounter (HOSPITAL_COMMUNITY): Payer: Self-pay | Admitting: Family Medicine

## 2024-01-11 DIAGNOSIS — I4819 Other persistent atrial fibrillation: Secondary | ICD-10-CM

## 2024-01-11 DIAGNOSIS — Z961 Presence of intraocular lens: Secondary | ICD-10-CM | POA: Diagnosis present

## 2024-01-11 DIAGNOSIS — Z1152 Encounter for screening for COVID-19: Secondary | ICD-10-CM | POA: Diagnosis not present

## 2024-01-11 DIAGNOSIS — Z79899 Other long term (current) drug therapy: Secondary | ICD-10-CM | POA: Diagnosis not present

## 2024-01-11 DIAGNOSIS — I5032 Chronic diastolic (congestive) heart failure: Secondary | ICD-10-CM | POA: Diagnosis present

## 2024-01-11 DIAGNOSIS — Z7901 Long term (current) use of anticoagulants: Secondary | ICD-10-CM | POA: Diagnosis not present

## 2024-01-11 DIAGNOSIS — Z85820 Personal history of malignant melanoma of skin: Secondary | ICD-10-CM | POA: Diagnosis not present

## 2024-01-11 DIAGNOSIS — I110 Hypertensive heart disease with heart failure: Secondary | ICD-10-CM | POA: Diagnosis present

## 2024-01-11 DIAGNOSIS — Z86711 Personal history of pulmonary embolism: Secondary | ICD-10-CM | POA: Diagnosis not present

## 2024-01-11 DIAGNOSIS — N179 Acute kidney failure, unspecified: Secondary | ICD-10-CM | POA: Diagnosis present

## 2024-01-11 DIAGNOSIS — I2510 Atherosclerotic heart disease of native coronary artery without angina pectoris: Secondary | ICD-10-CM | POA: Diagnosis present

## 2024-01-11 DIAGNOSIS — I4821 Permanent atrial fibrillation: Secondary | ICD-10-CM | POA: Diagnosis present

## 2024-01-11 DIAGNOSIS — R7989 Other specified abnormal findings of blood chemistry: Secondary | ICD-10-CM | POA: Diagnosis present

## 2024-01-11 DIAGNOSIS — Z9079 Acquired absence of other genital organ(s): Secondary | ICD-10-CM | POA: Diagnosis not present

## 2024-01-11 DIAGNOSIS — D649 Anemia, unspecified: Secondary | ICD-10-CM | POA: Diagnosis present

## 2024-01-11 DIAGNOSIS — Z8546 Personal history of malignant neoplasm of prostate: Secondary | ICD-10-CM | POA: Diagnosis not present

## 2024-01-11 DIAGNOSIS — E785 Hyperlipidemia, unspecified: Secondary | ICD-10-CM | POA: Diagnosis present

## 2024-01-11 DIAGNOSIS — Z8551 Personal history of malignant neoplasm of bladder: Secondary | ICD-10-CM | POA: Diagnosis not present

## 2024-01-11 DIAGNOSIS — D696 Thrombocytopenia, unspecified: Secondary | ICD-10-CM | POA: Diagnosis present

## 2024-01-11 DIAGNOSIS — I493 Ventricular premature depolarization: Secondary | ICD-10-CM | POA: Diagnosis present

## 2024-01-11 DIAGNOSIS — R932 Abnormal findings on diagnostic imaging of liver and biliary tract: Secondary | ICD-10-CM | POA: Diagnosis not present

## 2024-01-11 DIAGNOSIS — Z952 Presence of prosthetic heart valve: Secondary | ICD-10-CM | POA: Diagnosis not present

## 2024-01-11 DIAGNOSIS — I080 Rheumatic disorders of both mitral and aortic valves: Secondary | ICD-10-CM | POA: Diagnosis present

## 2024-01-11 DIAGNOSIS — Z9889 Other specified postprocedural states: Secondary | ICD-10-CM | POA: Diagnosis not present

## 2024-01-11 DIAGNOSIS — D6869 Other thrombophilia: Secondary | ICD-10-CM | POA: Diagnosis present

## 2024-01-11 DIAGNOSIS — I10 Essential (primary) hypertension: Secondary | ICD-10-CM | POA: Diagnosis not present

## 2024-01-11 DIAGNOSIS — R17 Unspecified jaundice: Secondary | ICD-10-CM | POA: Diagnosis not present

## 2024-01-11 DIAGNOSIS — Z951 Presence of aortocoronary bypass graft: Secondary | ICD-10-CM | POA: Diagnosis not present

## 2024-01-11 DIAGNOSIS — Z953 Presence of xenogenic heart valve: Secondary | ICD-10-CM | POA: Diagnosis not present

## 2024-01-11 DIAGNOSIS — K831 Obstruction of bile duct: Secondary | ICD-10-CM | POA: Diagnosis present

## 2024-01-11 DIAGNOSIS — Z86718 Personal history of other venous thrombosis and embolism: Secondary | ICD-10-CM | POA: Diagnosis not present

## 2024-01-11 LAB — COMPREHENSIVE METABOLIC PANEL WITH GFR
ALT: 72 U/L — ABNORMAL HIGH (ref 0–44)
AST: 93 U/L — ABNORMAL HIGH (ref 15–41)
Albumin: 3.5 g/dL (ref 3.5–5.0)
Alkaline Phosphatase: 116 U/L (ref 38–126)
Anion gap: 9 (ref 5–15)
BUN: 25 mg/dL — ABNORMAL HIGH (ref 8–23)
CO2: 21 mmol/L — ABNORMAL LOW (ref 22–32)
Calcium: 9.1 mg/dL (ref 8.9–10.3)
Chloride: 109 mmol/L (ref 98–111)
Creatinine, Ser: 1.06 mg/dL (ref 0.61–1.24)
GFR, Estimated: >60 mL/min (ref >60)
Glucose, Bld: 92 mg/dL (ref 70–99)
Potassium: 3.5 mmol/L (ref 3.5–5.1)
Sodium: 139 mmol/L (ref 135–145)
Total Bilirubin: 8.6 mg/dL — ABNORMAL HIGH (ref 0.0–1.2)
Total Protein: 5.1 g/dL — ABNORMAL LOW (ref 6.5–8.1)

## 2024-01-11 LAB — PROTIME-INR
INR: 1.5 — ABNORMAL HIGH (ref 0.8–1.2)
Prothrombin Time: 18.6 s — ABNORMAL HIGH (ref 11.4–15.2)

## 2024-01-11 LAB — CBC
HCT: 33.2 % — ABNORMAL LOW (ref 39.0–52.0)
Hemoglobin: 11 g/dL — ABNORMAL LOW (ref 13.0–17.0)
MCH: 32.7 pg (ref 26.0–34.0)
MCHC: 33.1 g/dL (ref 30.0–36.0)
MCV: 98.8 fL (ref 80.0–100.0)
Platelets: 104 K/uL — ABNORMAL LOW (ref 150–400)
RBC: 3.36 MIL/uL — ABNORMAL LOW (ref 4.22–5.81)
RDW: 14.5 % (ref 11.5–15.5)
WBC: 7.6 K/uL (ref 4.0–10.5)
nRBC: 0 % (ref 0.0–0.2)

## 2024-01-11 LAB — HEPARIN LEVEL (UNFRACTIONATED)
Heparin Unfractionated: >1.1 [IU]/mL — ABNORMAL HIGH (ref 0.30–0.70)
Heparin Unfractionated: >1.1 [IU]/mL — ABNORMAL HIGH (ref 0.30–0.70)

## 2024-01-11 LAB — APTT: aPTT: 146 s — ABNORMAL HIGH (ref 24–36)

## 2024-01-11 MED ORDER — HEPARIN (PORCINE) 25000 UT/250ML-% IV SOLN
950.0000 [IU]/h | INTRAVENOUS | Status: AC
  Administered 2024-01-12: 1100 [IU]/h via INTRAVENOUS
  Filled 2024-01-11: qty 250

## 2024-01-11 MED ORDER — HEPARIN (PORCINE) 25000 UT/250ML-% IV SOLN
1300.0000 [IU]/h | INTRAVENOUS | Status: DC
  Administered 2024-01-11: 1300 [IU]/h via INTRAVENOUS
  Filled 2024-01-11: qty 250

## 2024-01-11 MED ORDER — POTASSIUM CHLORIDE CRYS ER 20 MEQ PO TBCR
40.0000 meq | EXTENDED_RELEASE_TABLET | Freq: Once | ORAL | Status: AC
  Administered 2024-01-11: 40 meq via ORAL
  Filled 2024-01-11: qty 2

## 2024-01-11 MED ORDER — SODIUM CHLORIDE 0.9% FLUSH
3.0000 mL | Freq: Two times a day (BID) | INTRAVENOUS | Status: DC
  Administered 2024-01-11 – 2024-01-16 (×11): 3 mL via INTRAVENOUS

## 2024-01-11 MED ORDER — FUROSEMIDE 20 MG PO TABS
20.0000 mg | ORAL_TABLET | Freq: Every day | ORAL | Status: AC
  Administered 2024-01-11 – 2024-01-12 (×2): 20 mg via ORAL
  Filled 2024-01-11 (×2): qty 1

## 2024-01-11 NOTE — Consult Note (Addendum)
 Cardiology Consultation   Patient ID: Eric [name] MRN: [id number]; DOB: September 10, 1929  Admit date: 01/10/2024 Date of Consult: 01/11/2024  PCP:  Rolinda Millman, MD   [facility] Providers Cardiologist:  Gordy Bergamo, MD  Structural Heart:  Ozell Fell, MD      Patient Profile: Eric [name] is a 89 y.o. male with a hx of severe mitral regurgitation s/p remote mitral valve repair with recurrent MR now s/p transseptal transcatheter valve in ring TMVR 12/18/2023 at [facility] clinic, history of TAVR 11/2018, HFpEF, paroxysmal atrial fibrillation on Eliquis , who is being seen 01/11/2024 for the evaluation of prior cardiac history at the request of Dr. Perri.  History of Present Illness: Eric [name] has a history of prior bioprosthetic mitral valve repair, bioprosthetic AVR, and CABG x 1 with SVG-OM1 in 2009.  He has a history of atypical atrial flutter s/p DCCV in 2015 and 2019.  He subsequently underwent TAVR 11/2022 severe AI.  He was hospitalized February 2025 with atrial fibrillation with RVR and CHF.  Workup completed revealed recurrent mitral regurgitation.  He was treated for HFpEF.  He was evaluated by the structural heart team.  He has ago s/p transseptal transcatheter valve in ring TMVR 11/2023 with [facility] clinic.  He is also status post TAVR in 2020.  He has a history of CAD with prior CABG. As part of the workup prior to [facility], right left heart catheterization 09/2023 showed single-vessel CAD with moderate 75% stenosis in the proximal to mid LCx with patent SVG to OM1.  He had widely patent left main, LAD, RCA.  He was last seen by structural heart team 12/30/2023 with no cardiac complaints.  Unfortunately he presented to [facility] ED after outpatient labs performed for jaundice noticed by the dermatologist showed elevated T. bili and LFTs.  He underwent MRCP with concerns for new soft tissue mass and was advised to follow in the ER for soft tissue mass and obstructive  hyperbilirubinemia.  Eliquis  held for GI evaluation for likely ERCP.  Cardiology was evaluated due to recent valve intervention.  Eliquis  was held with plans to bridge with heparin . On exam, he appears euvolemic. He reports intermittent chest discomfort prior to recent valve replacement, but no CP since. He is unaware of his Afib and PVCs.    His wife is bedside and helps with history. They recount their current workup for painless jaundice.    Past Medical History:  Diagnosis Date   Atrial flutter, paroxysmal (HCC)    a. dx 04-02-2014, s/p  successful cardioversion 04-06-2014.   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 s/p mitral ring annuloplasty, repair ruptured chordae of P2 flail segments of MV   S/P aortic valve replacement with bioprosthetic valve    06-19-2007  severe aortic valve stenosis   S/P valve-in-valve TAVR 11/25/2018  Single vessel coronary artery disease   cardiologist-  dr dann   a. 05/2007 CABG x 1: s/p SVG-OM;  b. 10/2010 Ex MV: EF 72%, inf attenuation w/o ischemia, brief run of PAT with exercise. To   Stroke (HCC)    Thrombocytopenia (HCC)    Thyroid  goiter 2013   nodular   Wears hearing aid    BILATERAL   Wears partial dentures    UPPER    Past Surgical History:  Procedure Laterality Date   AORTIC VALVE REPLACEMENT (AVR)/CORONARY ARTERY BYPASS GRAFTING (CABG)  06-19-2007  dr lucas   SVG to OM1 ;   AVR w/ #23 Mitralflow  pericardial and ligation left atrial appendage;  MV repair w/ #28 Sorin 3-D memo Ring Annuloplasty with repair ruptured chordar of P-2 flail segments   CARDIAC CATHETERIZATION  05-12-2007  dr malva   single vessel 30% ostial LAD,  80% LCx;  severe to critial AS,  moderate MR,  normal LVF, ef 70%,  normal right heart pressures and cardiac outputs,  mild elevated LV end-diastolic pressure     CARDIOVASCULAR STRESS TEST  11-02-2010  dr dann   normal nuclear study w/ no ischemia or infarct/scar/  normal LV function and wall motion , ef 72%   CARDIOVERSION N/A 04/06/2014   Procedure: CARDIOVERSION;  Surgeon: Leim VEAR Moose, MD;  Location: [facility] ENDOSCOPY;  Service: Cardiovascular;  Laterality: N/A;   successful   CATARACT EXTRACTION W/ INTRAOCULAR LENS  IMPLANT, BILATERAL  2012  approx   CORONARY ARTERY BYPASS GRAFT     PROSTATECTOMY     RETROPUBIC RADICAL PROSTATECTOMY  06/ 2000  in [place], [place]   RIGHT/LEFT HEART CATH AND CORONARY/GRAFT ANGIOGRAPHY N/A 10/29/2018   Procedure: RIGHT/LEFT HEART CATH AND CORONARY/GRAFT ANGIOGRAPHY;  Surgeon: Wonda Sharper, MD;  Location: [facility] INVASIVE CV LAB;  Service: Cardiovascular;  Laterality: N/A;   RIGHT/LEFT HEART CATH AND CORONARY/GRAFT ANGIOGRAPHY N/A 10/04/2023   Procedure: RIGHT/LEFT HEART CATH AND CORONARY/GRAFT ANGIOGRAPHY;  Surgeon: Wonda Sharper, MD;  Location: [facility] INVASIVE CV LAB;  Service: Cardiovascular;  Laterality: N/A;   ROTATOR CUFF REPAIR Left 09/1998   TEE WITHOUT CARDIOVERSION N/A 04/06/2014   Procedure: TRANSESOPHAGEAL ECHOCARDIOGRAM (TEE);  Surgeon: Leim VEAR Moose, MD;  Location: [facility] ENDOSCOPY;  Service: Cardiovascular;  Laterality: N/A;  mild focal basa LVH of the septum, ef 50-55%, s/p AV annuloplasty ring, elongated chordae, thickened leaflets, mild to mod. MR, multiple small jets/arotic bioprosthetic valve sits well in position, mild AI/ thickened TV w/ mild reurg./mild LA   TEE WITHOUT CARDIOVERSION N/A 10/22/2018   Procedure:  TRANSESOPHAGEAL ECHOCARDIOGRAM (TEE);  Surgeon: Jeffrie Oneil BROCKS, MD;  Location: [facility] ENDOSCOPY;  Service: Cardiovascular;  Laterality: N/A;   TEE WITHOUT CARDIOVERSION N/A 11/25/2018   Procedure: TRANSESOPHAGEAL ECHOCARDIOGRAM (TEE);  Surgeon: Wonda Sharper, MD;  Location: [facility] OR;  Service: Open Heart Surgery;  Laterality: N/A;   TRANSCATHETER AORTIC VALVE REPLACEMENT, TRANSFEMORAL N/A 11/25/2018   Procedure: TRANSCATHETER AORTIC VALVE REPLACEMENT, TRANSFEMORAL;  Surgeon: Wonda Sharper, MD;  Location: [facility] OR;  Service: Open Heart Surgery;  Laterality: N/A;   TRANSESOPHAGEAL ECHOCARDIOGRAM (CATH LAB) N/A 10/04/2023   Procedure: TRANSESOPHAGEAL ECHOCARDIOGRAM;  Surgeon: Santo Stanly LABOR, MD;  Location: [facility] INVASIVE CV LAB;  Service: Cardiovascular;  Laterality: N/A;   TRANSTHORACIC ECHOCARDIOGRAM  03-23-2016   dr dann   EF 55-60%, reduced contribution atrial contraction to ventricular filling, due to increased ventricular diastolic pressure or atrial contratile dysfunction/  bioprosthetic AV w/ mod. regurg. (valve area 1.97cm^2)/ mild dilated ascending aorta/ mild thicken MV  normal function annular ring prosthesis/severe LAE & mod. to sev.RAE/ PASP 14mmHg/ mild TR/ ventricle septal motion show paradox   TRANSURETHRAL RESECTION OF BLADDER TUMOR N/A 02/13/2016   Procedure: TRANSURETHRAL RESECTION OF BLADDER TUMOR (TURBT);  Surgeon: Redell Lynwood Napoleon, MD;  Location: [facility];  Service: Urology;  Laterality: N/A;   TRANSURETHRAL RESECTION OF BLADDER TUMOR N/A 09/04/2016   Procedure: TRANSURETHRAL RESECTION OF BLADDER TUMOR (TURBT);  Surgeon: Redell Lynwood Napoleon, MD;  Location: [facility];  Service: Urology;  Laterality: N/A;     Home Medications:  Prior to Admission medications   Medication Sig Start Date End Date Taking? Authorizing Provider  amoxicillin  (AMOXIL ) 500 MG tablet Take 4 tablets (2,000 mg total) by mouth as directed. Take 1 hour prior to dental  work, including cleanings. 04/27/19   Dann Candyce RAMAN, MD  apixaban  (ELIQUIS ) 5 MG TABS tablet TAKE 1 TABLET BY MOUTH TWICE  DAILY 10/08/23   Cooper, Michael, MD  Ascorbic Acid (VITAMIN C PO) Take 500 mg by mouth in the morning. Gummies 250 mg each    [provider]  atorvastatin  (LIPITOR) 20 MG tablet Take 40 mg by mouth every evening.    [provider]  diltiazem  (CARDIZEM ) 30 MG tablet Take 1 tablet (30 mg total) by mouth daily as needed. 12/30/23   Sebastian Lamarr SAUNDERS, PA-C  Eyelid Cleansers (OCUSOFT EYELID CLEANSING) PADS Place 1 application into the left eye in the morning.    [provider]  ezetimibe  (ZETIA ) 10 MG tablet TAKE 1 TABLET BY MOUTH IN THE  EVENING 12/27/23   Wonda Sharper, MD  furosemide  (LASIX ) 20 MG tablet Take 1 tablet (20 mg total) by mouth daily. 08/09/23   Ladona Heinz, MD  losartan  (COZAAR ) 25 MG tablet Take 0.5 tablets (12.5 mg total) by mouth at bedtime. 12/30/23   Sebastian Lamarr SAUNDERS, PA-C  metoprolol  tartrate (LOPRESSOR ) 25 MG tablet Take 2 tablets (50 mg total) by mouth 2 (two) times daily. 12/30/23   Sebastian Lamarr SAUNDERS, PA-C  nitroGLYCERIN  (NITROSTAT ) 0.4 MG SL tablet Place 1 tablet (0.4 mg total) under the tongue every 5 (five) minutes as needed. 02/04/23 12/30/23  Dann Candyce RAMAN, MD  spironolactone  (ALDACTONE ) 25 MG tablet Take 0.5 tablets (12.5 mg total) by mouth in the morning. 12/30/23   Sebastian Lamarr SAUNDERS, PA-C    Scheduled Meds:  furosemide   20 mg Oral Daily   metoprolol  tartrate  50 mg Oral BID   sodium chloride  flush  3 mL Intravenous Q12H   Continuous Infusions:  PRN Meds: mouth rinse  Allergies:   No Known Allergies  Social History:   Social History   Socioeconomic History   Marital status: Married    Spouse name: Ann   Number of children: 2   Years of education: Not on file   Highest education level: Some college, no degree  Occupational History   Occupation: Retired  Tobacco Use   Smoking status: Never    Smokeless tobacco: Never  Vaping Use   Vaping status: Never Used  Substance and Sexual Activity   Alcohol use: Yes    Alcohol/week: 1.0 - 2.0 standard drink of alcohol    Types: 1 - 2 Standard drinks or equivalent per week    Comment: OCC   Drug use: No   Sexual activity: Not on file  Other Topics Concern   Not on file  Social History Narrative   01/03/21 Lives in [place] with wife.  Active.   Caffeine-  1-2 c coffee daily   Social Drivers of Corporate investment banker Strain: Low Risk  (12/19/2023)   Received from [facility]   Overall Financial Resource Strain (CARDIA)    Difficulty of Paying Living Expenses: Not hard at all  Food Insecurity: No Food Insecurity (01/10/2024)   Hunger Vital Sign    Worried About Running Out of Food in the Last Year: Never true    Ran Out of Food in the Last Year: Never true  Transportation Needs: No Transportation Needs (01/10/2024)   PRAPARE - Administrator, Civil Service (Medical): No    Lack of Transportation (Non-Medical): No  Physical Activity: Insufficiently Active (12/19/2023)   Received from [facility]   Exercise Vital Sign    On average, how many days per week do you engage in moderate to strenuous exercise (like a brisk walk)?: 2 days    On average, how many minutes do you engage in exercise at this level?: 30 min  Stress: Patient Unable To Answer (12/19/2023)   Received from [facility] - Occupational Stress Questionnaire    Feeling of Stress : Patient unable to answer  Social Connections: Socially Integrated (01/10/2024)   Social Connection and Isolation Panel    Frequency of Communication with Friends and Family: Three times a week    Frequency of Social Gatherings with Friends and Family: Once a week    Attends Religious Services: More than 4 times per year    Active Member of [employer] or Organizations: Yes    Attends Banker Meetings: 1 to 4 times per  year    Marital Status: Married  Catering manager Violence: Not At Risk (01/10/2024)   Humiliation, Afraid, Rape, and Kick questionnaire    Fear of Current or Ex-Partner: No    Emotionally Abused: No    Physically Abused: No    Sexually Abused: No    Family History:    Family History  Problem Relation Age of Onset   Leukemia Father    Supraventricular tachycardia Sister    Heart attack Neg Hx    Stroke Neg Hx    Hypertension Neg Hx      ROS:  Please see the history of present illness.   All other ROS reviewed and negative.     Physical Exam/Data: Vitals:   01/10/24 2019 01/10/24 2341 01/11/24 0319 01/11/24 0750  BP: (!) 159/83 (!) 143/85 128/81 95/63  Pulse: (!) 44 68 76 82  Resp: 18 19 19 18   Temp: 97.9 F (36.6 C) 97.9 F (36.6 C) 98 F (36.7 C) 98 F (36.7 C)  TempSrc: Oral Oral Oral Oral  SpO2: 99% 97% 98% 98%  Weight: 81.6 kg  77.6 kg   Height: 6' (1.829 m)       Intake/Output Summary (Last 24 hours) at 01/11/2024 0912 Last data filed at 01/11/2024 0319 Gross per 24 hour  Intake --  Output 325 ml  Net -325 ml      01/11/2024    3:19 AM 01/10/2024    8:19 PM 01/10/2024    4:43 PM  Last 3 Weights  Weight (lbs) 171 lb 1.2 oz 179 lb 14.4 oz 173 lb 15.1 oz  Weight (kg) 77.6 kg 81.602 kg 78.9 kg     Body mass index is 23.2 kg/m.  General:  elderly male in NAD, appears younger than stated age HEENT: normal Neck: no JVD Vascular: No carotid bruits; Distal pulses  2+ bilaterally Cardiac: irregular rhythm, regular rate Lungs:  clear to auscultation bilaterally, no wheezing, rhonchi or rales  Abd: soft, nontender, no hepatomegaly  Ext: no edema Musculoskeletal:  No deformities, BUE and BLE strength normal and equal Skin: warm and dry  Neuro:  CNs 2-12 intact, no focal abnormalities noted Psych:  Normal affect   EKG:  The EKG was personally reviewed and demonstrates:  pending Telemetry:  Telemetry was personally reviewed and demonstrates:  Atrial  fibrillation with VR in the 60-70s, frequent PVCs  Relevant CV Studies:  Heart cath 09/2023: 1.  Single-vessel coronary artery disease with moderate 75% eccentric stenosis of the proximal to mid circumflex 2.  Continued patency of the SVG to OM1 3.  Widely patent left main, LAD, and RCA with no significant stenoses 4.  Right heart catheterization data: RA mean 6 mmHg RV pressure 47 over 5 mmHg PA pressure 51/16 with a mean of 32 mmHg Pulmonary wedge A-wave 13, V wave 41, mean 22 mmHg Cardiac output 5.8 L/min with cardiac index 2.8 L/min/m   Discussion: Patent with stable coronary anatomy, patent native vessels with moderately severe circumflex stenosis and a patent SVG to OM.  Hemodynamics are pertinent for a large V wave consistent with the patient's severe mitral regurgitation as well as associated moderate pulmonary hypertension.  Transpulmonary gradient is only 10 mmHg with a PVR of 1.7 Wood units.  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  Plan to proceed with CT angiography to continue the patient's evaluation for transcatheter mitral valve and ring replacement.   Laboratory Data: High Sensitivity Troponin:  No results for input(s): TROPONINIHS in the last 720 hours.   Chemistry Recent Labs  Lab 01/10/24 1648 01/11/24 0252  NA 137 139  K 3.7 3.5  CL 108 109  CO2 20* 21*  GLUCOSE 136* 92  BUN 27* 25*  CREATININE 1.23 1.06  CALCIUM  9.3 9.1  GFRNONAA 55* >60  ANIONGAP 9 9    Recent Labs  Lab 01/10/24 1648 01/11/24 0252  PROT 6.9 5.1*  ALBUMIN 3.8 3.5  AST 104* 93*  ALT 89* 72*  ALKPHOS 132* 116  BILITOT 8.1* 8.6*   Lipids No results for input(s): CHOL, TRIG, HDL, LABVLDL, LDLCALC, CHOLHDL in the last 168 hours.  Hematology Recent Labs  Lab 01/10/24 1648 01/11/24 0252  WBC 4.7 7.6  RBC 3.39* 3.36*  HGB 10.9* 11.0*  HCT 33.8* 33.2*  MCV 99.7 98.8  MCH 32.2 32.7  MCHC 32.2 33.1  RDW  14.3 14.5  PLT 130* 104*   Thyroid  No results for input(s): TSH, FREET4 in the last 168 hours.  BNPNo results for input(s): BNP, PROBNP in the last 168 hours.  DDimer No results for input(s): DDIMER in the last 168 hours.  Radiology/Studies:  MR ABDOMEN MRCP W WO CONTAST Result Date: 01/10/2024 CLINICAL DATA:  Dilated bile ducts. EXAM: MRI ABDOMEN WITHOUT AND WITH CONTRAST (INCLUDING MRCP) TECHNIQUE: Multiplanar multisequence MR imaging of the abdomen was performed both before and after the administration of intravenous contrast. Heavily T2-weighted images of the biliary and pancreatic ducts were obtained, and three-dimensional MRCP images were rendered by post processing. CONTRAST:  7.5mL GADAVIST  GADOBUTROL  1 MMOL/ML IV SOLN COMPARISON:  Multiple priors including CTA chest Oct 15, 2022 and CT abdomen pelvis August 23, 2010. FINDINGS: Lower chest: Trace right pleural effusion. Hepatobiliary: Diffuse hepatic steatosis. No suspicious hepatic lesion. Distended gallbladder.  No evidence of acute cholecystitis. Marked dilation of the common bile duct with  mild prominence of the central intrahepatic biliary tree. Common duct measures up to 2.1 cm in diameter. No discrete choledocholithiasis. Question of a soft tissue mass along the duodenal wall and distal common duct on image 28/12 with hypoenhancement in this area on postcontrast image 73/1001. DWI and ADC sequences are significantly limited by motion. Pancreas: No pancreatic ductal dilation or evidence of acute inflammation. Spleen:  No splenomegaly or focal splenic lesion. Adrenals/Urinary Tract: No suspicious adrenal nodule/mass. No hydronephrosis. Stomach/Bowel: Visualized portions within the abdomen are unremarkable. Vascular/Lymphatic: No pathologically enlarged lymph nodes identified. No abdominal aortic aneurysm demonstrated. Other:  No significant abdominal free fluid. Musculoskeletal: No suspicious bone lesions identified. IMPRESSION: Motion  degraded examination limits sensitivity and specificity. Within this context: 1. Marked dilation of the common bile duct with mild prominence of the central intrahepatic biliary tree. No discrete choledocholithiasis. Question of a soft tissue mass along the duodenal wall and distal common duct with hypoenhancement in this area on postcontrast sequence. DWI and ADC sequences are significantly limited by motion. Recommend further evaluation with ERCP. 2. Gallbladder is distended without evidence of acute cholecystitis. 3. Diffuse hepatic steatosis. 4. Trace right pleural effusion. Electronically Signed   By: Reyes Holder M.D.   On: 01/10/2024 14:09   MR 3D Recon At Scanner Result Date: 01/10/2024 CLINICAL DATA:  Dilated bile ducts. EXAM: MRI ABDOMEN WITHOUT AND WITH CONTRAST (INCLUDING MRCP) TECHNIQUE: Multiplanar multisequence MR imaging of the abdomen was performed both before and after the administration of intravenous contrast. Heavily T2-weighted images of the biliary and pancreatic ducts were obtained, and three-dimensional MRCP images were rendered by post processing. CONTRAST:  7.5mL GADAVIST  GADOBUTROL  1 MMOL/ML IV SOLN COMPARISON:  Multiple priors including CTA chest Oct 15, 2022 and CT abdomen pelvis August 23, 2010. FINDINGS: Lower chest: Trace right pleural effusion. Hepatobiliary: Diffuse hepatic steatosis. No suspicious hepatic lesion. Distended gallbladder.  No evidence of acute cholecystitis. Marked dilation of the common bile duct with mild prominence of the central intrahepatic biliary tree. Common duct measures up to 2.1 cm in diameter. No discrete choledocholithiasis. Question of a soft tissue mass along the duodenal wall and distal common duct on image 28/12 with hypoenhancement in this area on postcontrast image 73/1001. DWI and ADC sequences are significantly limited by motion. Pancreas: No pancreatic ductal dilation or evidence of acute inflammation. Spleen:  No splenomegaly or focal  splenic lesion. Adrenals/Urinary Tract: No suspicious adrenal nodule/mass. No hydronephrosis. Stomach/Bowel: Visualized portions within the abdomen are unremarkable. Vascular/Lymphatic: No pathologically enlarged lymph nodes identified. No abdominal aortic aneurysm demonstrated. Other:  No significant abdominal free fluid. Musculoskeletal: No suspicious bone lesions identified. IMPRESSION: Motion degraded examination limits sensitivity and specificity. Within this context: 1. Marked dilation of the common bile duct with mild prominence of the central intrahepatic biliary tree. No discrete choledocholithiasis. Question of a soft tissue mass along the duodenal wall and distal common duct with hypoenhancement in this area on postcontrast sequence. DWI and ADC sequences are significantly limited by motion. Recommend further evaluation with ERCP. 2. Gallbladder is distended without evidence of acute cholecystitis. 3. Diffuse hepatic steatosis. 4. Trace right pleural effusion. Electronically Signed   By: Reyes Holder M.D.   On: 01/10/2024 14:09   US  Abdomen Limited RUQ (LIVER/GB) Result Date: 01/08/2024 CLINICAL DATA:  Jaundice.  Hyperbilirubinemia EXAM: ULTRASOUND ABDOMEN LIMITED RIGHT UPPER QUADRANT COMPARISON:  None Available. FINDINGS: Gallbladder: No gallstones or wall thickening visualized. No sonographic Murphy sign noted by sonographer. Common bile duct: Diameter: Dilated to 20 mm. Liver:  No focal lesion identified. Within normal limits in parenchymal echogenicity. No intrahepatic duct dilatation identified. Portal vein is patent on color Doppler imaging with normal direction of blood flow towards the liver. Other: None. IMPRESSION: 1. No evidence acute cholecystitis. 2. Dilatation of the common bile duct without obstructing lesion identified. In patient with elevated bilirubin, consider MRCP for further evaluation of the common bile duct. Electronically Signed   By: Jackquline Boxer M.D.   On: 01/08/2024  16:14     Assessment and Plan:  HFpEF Hypertension - Maintain on 20 mg Lasix  daily, 12.5 mg spironolactone , 50 mg Lopressor  twice daily, 12.5 mg losartan  - he appears euvolemic on exam - hold lasix  for now, can continue BB, losartan , and spironolactone  for now - will plan for targeted dose of IV lasix  if needed   PVCs - asymptomatic  - question burden prior to [facility] - can check Mg with next labs - continue BB   CAD s/p CABG x 1 with SVG-OM1 - [facility] 2025 with patent graft -No aspirin  given Eliquis  -- no further chest pain   Severe bioprosthetic MR s/p transseptal transcatheter valve and ring TMVR 12/19/2023 with [facility] - Requires SBE Ppx - would give PPX antibiotics given upcoming procedure   Aortic stenosis s/p AVR in 2009, TAVR in 2020 -SBE prophylaxis as above   Persistent atrial fibrillation, rate controlled - DCCV in 2015, 2019 - Hospitalization in 06/2023 for A-fib with RVR and CHF - Maintained on Lopressor  50 mg twice daily - Anticoagulated with Eliquis    Prior DVT - Chronic anticoagulation - Continue Eliquis  5 mg twice daily -- hold eliquis , agree with heparin  gtt   Preoperative risk evaluation for MACE prior to MRCP Pt has risk factors including CAD and CHF. Recent heart cath and mitral valve intervention with stable CAD and good results, respectively. He currently has no unstable cardiac issues. He understands he has 6.6% risk of MACE. With recent valve intervention, recommend PPX ABX. No further workup required prior to MRCP.  Continue heparin  gtt.  We will follow for diuresis needs.    Risk Assessment/Risk Scores:    CHA2DS2-VASc Score = 6   This indicates a 9.7% annual risk of stroke. The patient's score is based upon: CHF History: 0 HTN History: 0 Diabetes History: 0 Stroke History: 2 Vascular Disease History: 1 Age Score: 2 Gender Score: 0      For questions or updates, please contact [facility] Please consult [email or web address]  for contact info under    Signed, Jon Nat Hails, PA  01/11/2024 9:12 AM   I have seen, examined the patient, and reviewed the above assessment and plan.    HPI: Eric [name] is a 89 y.o. male with a history of persistent AFib, bladder CA s/p TURBT, remote DVT (2000), stroke, CAD s/p 1v CABG and bioprosthetic AVR and MVR 2009, then valve-in-valve TAVR 2020, then transseptal transcatheter valve in ring TMVR with [facility] at [facility] 12/19/2023 who is currently admitted for planned ERCP.  He has some mild upper abdominal discomfort.  His biggest concern prompting evaluation was his jaundice.  He denies any active cardiac issues, including palpitations, chest pain, shortness of breath, lower extremity edema.  General: Well developed, in no acute distress.  Neck: No JVD.  Cardiac: Normal rate, regular rhythm.  Resp: Normal work of breathing.  Ext: No edema.  Neuro: No gross focal deficits.  Psych: Normal affect.   Assessment: Patient is admitted with jaundice.  Found to have obstructive soft tissue  mass on MRCP.  Admitted for planned ERCP.  Cardiology consulted due to complex cardiac history.    Patient has been doing well since his TMVR on 12/19/2023.  He appears well compensated on exam.  He denies any chest pain.  There are no active cardiac concerns.  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 - Low threshold to start empiric antibiotics if there is any suspicion for GI related infection.  At minimum, he will need prophylactic antibiotics for any procedure/intervention given fresh bioprosthetic valve implantation. - He appears well compensated on exam, so would continue current home regimen of cardiac meds.  I  see no need for IV at this time.  Can hold oral Lasix  on day of ERCP, as I suspect he will be n.p.o. for a period of time.  Fonda Kitty, MD 01/11/2024 11:39 AM

## 2024-01-11 NOTE — Progress Notes (Addendum)
 PHARMACY - ANTICOAGULATION CONSULT NOTE  Pharmacy Consult for IV heparin  Indication: atrial fibrillation  No Known Allergies  Patient Measurements: Height: 6' (182.9 cm) Weight: 77.6 kg (171 lb 1.2 oz) IBW/kg (Calculated) : 77.6 HEPARIN  DW (KG): 81.6  Vital Signs: Temp: 98 F (36.7 C) (08/23 0750) Temp Source: Oral (08/23 0750) BP: 95/63 (08/23 0750) Pulse Rate: 82 (08/23 0750)  Labs: Recent Labs    01/10/24 1648 01/10/24 1739 01/11/24 0252  HGB 10.9*  --  11.0*  HCT 33.8*  --  33.2*  PLT 130*  --  104*  APTT  --  34  --   LABPROT  --  19.8* 18.6*  INR  --  1.6* 1.5*  CREATININE 1.23  --  1.06    Estimated Creatinine Clearance: 48.8 mL/min (by C-G formula based on SCr of 1.06 mg/dL).   Medical History: Past Medical History:  Diagnosis Date   Atrial flutter, paroxysmal (HCC)    a. dx 04-02-2014, s/p  successful cardioversion 04-06-2014.   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 05/2007 CABG x 1: s/p SVG-OM;  b. 10/2010 Ex MV: EF 72%, inf attenuation w/o ischemia, brief run of PAT with exercise. 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 Take 1 hour prior to dental work, including cleanings. 4 tablet 6    apixaban  (ELIQUIS ) 5 MG TABS tablet TAKE 1 TABLET BY MOUTH TWICE  DAILY 180 tablet 2    Ascorbic Acid (VITAMIN C PO) Take 500 mg by mouth in the morning. Gummies 250 mg each      atorvastatin  (LIPITOR) 20 MG tablet Take 40 mg by mouth every evening.      diltiazem  (CARDIZEM ) 30 MG tablet Take 1 tablet (30 mg total) by mouth daily as needed. 90 tablet 0    Eyelid Cleansers (OCUSOFT EYELID CLEANSING) PADS Place 1 application into the left eye in the morning.      ezetimibe  (ZETIA ) 10 MG tablet TAKE 1 TABLET BY MOUTH IN THE  EVENING 90 tablet 3    furosemide  (LASIX ) 20 MG tablet Take 1 tablet (20 mg total) by mouth daily. 90 tablet 3  losartan  (COZAAR ) 25 MG tablet Take 0.5 tablets (12.5 mg total) by mouth at bedtime. 45 tablet 1    metoprolol  tartrate (LOPRESSOR ) 25 MG tablet Take 2 tablets (50 mg total) by mouth 2 (two) times daily. 180 tablet 1    nitroGLYCERIN  (NITROSTAT ) 0.4 MG SL tablet Place 1 tablet (0.4 mg total) under the tongue every 5 (five) minutes as needed. 25 tablet 3    spironolactone  (ALDACTONE ) 25 MG tablet Take 0.5 tablets (12.5 mg total) by mouth in the morning. 45 tablet 1    Scheduled:   furosemide   20 mg Oral Daily   metoprolol  tartrate  50 mg Oral BID   sodium chloride  flush  3 mL Intravenous Q12H   Infusions:  PRN: mouth rinse  Assessment: 89 YO M with PMH significant for Atrial Fibrillation on Eliquis  (Last dose 08/22 AM), bladder CA s/p TURBT, remote DVT (2000), stroke, CAD s/p 1v CABG and bioprosthetic AVR and MVR 2009. Patient presents presented to [facility] for abnomal labs in T. Bili and LFTs found to be  concerns for new soft tissue mass. Pharmacy consulted for heparin  for atrial fibrillation.  Given last dose of Eliquis  was yesterday morning, will not give a bolus and will get baseline aPTT and heparin  level.  Goal of Therapy:  Heparin  level 0.3-0.7 units/ml aPTT 66-102 seconds Monitor platelets by anticoagulation protocol: Yes   Plan:  Start heparin  infusion at 1300 units/hr Obtain baseline aPTT and anti-Xa level Check aPTT and anti-Xa level in 8 hours and daily while on heparin  Daily CBC Continue to monitor H&H and platelets  R. Samual Satterfield, PharmD PGY-1 Acute Care Pharmacy Resident [facility] 01/11/2024 9:32 AM

## 2024-01-11 NOTE — Progress Notes (Signed)
 PROGRESS NOTE    [name]  FMW:[id number] DOB: [birth year] DOA: 01/10/2024 PCP: Rolinda Millman, MD  Chief Complaint  Patient presents with   labs high    Brief Narrative:   Eric [name] is Eric [name] 89 y.o. male with Chipper Koudelka history of persistent AFib, bladder CA s/p TURBT, remote DVT (2000), stroke, CAD s/p 1v CABG and bioprosthetic AVR and MVR 2009, then valve-in-valve TAVR 2020, then transseptal transcatheter valve in ring TMVR with LAMPOON at [facility] 12/19/2023 who presented to the ED today on the advice of his PCP due to abnormal MRCP. He had been recovering well from [facility], presented to routine dermatology visit earlier this week, was noted to be jaundiced, labs confirmed hyperbilirubinemia and subsequent MRCP performed this morning showed concern for an obstructive soft tissue mass. ERCP was recommended and pt told to proceed to ED. [facility] was consulted by EDP, will consult in AM.    He reports mild upper abdominal discomfort that was fleeting but none now. Has darker urine of late. Limited appetite of late but no nausea or vomiting, normal BMs. No unintentional weight loss, fevers, night sweats.    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 Of soft tissue mass along duodenal wall and distal common duct with hypoenhancement in this area on post contrast sequence.  Recommended further eval with ERCP.   EDP consulted GI, will follow up with them  this morning regarding timing of any procedure, he's currently NPO Last dose of eliquis  was 8/22 am, will bridge with heparin    Transcatheter transeptal mitral valve replacement (12/19/2023) History of Severe MVR s/p MVR/AVR/CABGx1 (2009) Hx TAVR History of Atrial Fibrillation  Chadsvasc at least 5, will plan for bridging anticoagulation with heparin  while we hold eliquis  With his recent procedure, will consult cardiology  HFpEF Continue lasix  and metop for now ARB and spironolactone  currently on hold   CAD Dyslipidemia Statin and zetia  on hold Anticoagulation on hold  Thrombocytopenia Will trend, was low early August in care everywhere labs Values here are similar, mildly improved Will continue to monitor      DVT prophylaxis: heparin  gtt Code Status: full Family Communication: wife at bedside Disposition:   Status is: Observation The patient remains OBS appropriate and will d/c before 2 midnights.   Consultants:  GI cardiology  Procedures:  none  Antimicrobials:  Anti-infectives (From admission, onward)    None       Subjective: No complaints  Objective: Vitals:   01/10/24 2019 01/10/24 2341 01/11/24 0319 01/11/24 0750  BP: (!) 159/83 (!) 143/85 128/81 95/63  Pulse: (!) 44 68 76 82  Resp: 18 19 19 18   Temp: 97.9 F (36.6 C) 97.9 F (36.6 C) 98 F (36.7 C) 98 F (36.7 C)  TempSrc: Oral Oral Oral Oral  SpO2: 99% 97% 98% 98%  Weight: 81.6 kg  77.6 kg   Height: 6' (1.829 m)       Intake/Output Summary (Last 24  hours) at 01/11/2024 0910 Last data filed at 01/11/2024 0319 Gross per 24 hour  Intake --  Output 325 ml  Net -325 ml   Filed Weights   01/10/24 1643 01/10/24 2019 01/11/24 0319  Weight: 78.9 kg 81.6 kg 77.6 kg    Examination:  General exam: Appears calm and comfortable - jaundiced Respiratory system: Clear to auscultation. Respiratory effort normal. Cardiovascular system: S1 & S2 heard, RRR.  Gastrointestinal system: Abdomen is  nondistended, soft and nontender. No organomegaly or masses felt. Normal bowel sounds heard. Central nervous system: Alert and oriented. No focal neurological deficits. Extremities:no LEE Skin: No rashes, lesions or ulcers Psychiatry: Judgement and insight appear normal. Mood & affect appropriate.     Data Reviewed: I have personally reviewed following labs and imaging studies  CBC: Recent Labs  Lab 01/10/24 1648 01/11/24 0252  WBC 4.7 7.6  HGB 10.9* 11.0*  HCT 33.8* 33.2*  MCV 99.7 98.8  PLT 130* 104*    Basic Metabolic Panel: Recent Labs  Lab 01/10/24 1648 01/11/24 0252  NA 137 139  K 3.7 3.5  CL 108 109  CO2 20* 21*  GLUCOSE 136* 92  BUN 27* 25*  CREATININE 1.23 1.06  CALCIUM  9.3 9.1    GFR: Estimated Creatinine Clearance: 48.8 mL/min (by C-G formula based on SCr of 1.06 mg/dL).  Liver Function Tests: Recent Labs  Lab 01/10/24 1648 01/11/24 0252  AST 104* 93*  ALT 89* 72*  ALKPHOS 132* 116  BILITOT 8.1* 8.6*  PROT 6.9 5.1*  ALBUMIN 3.8 3.5    CBG: No results for input(s): GLUCAP in the last 168 hours.   No results found for this or any previous visit (from the past 240 hours).       Radiology Studies: MR ABDOMEN MRCP W WO CONTAST Result Date: 01/10/2024 CLINICAL DATA:  Dilated bile ducts. EXAM: MRI ABDOMEN WITHOUT AND WITH CONTRAST (INCLUDING MRCP) TECHNIQUE: Multiplanar multisequence MR imaging of the abdomen was performed both before and after the administration of intravenous contrast. Heavily T2-weighted images of the biliary and pancreatic ducts were obtained, and three-dimensional MRCP images were rendered by post processing. CONTRAST:  7.5mL GADAVIST  GADOBUTROL  1 MMOL/ML IV SOLN COMPARISON:  Multiple priors including CTA chest Oct 15, 2022 and CT abdomen pelvis August 23, 2010. FINDINGS: Lower chest: Trace right pleural effusion. Hepatobiliary: Diffuse hepatic steatosis. No suspicious hepatic lesion. Distended gallbladder.  No evidence of  acute cholecystitis. Marked dilation of the common bile duct with mild prominence of the central intrahepatic biliary tree. Common duct measures up to 2.1 cm in diameter. No discrete choledocholithiasis. Question of Tevita Gomer soft tissue mass along the duodenal wall and distal common duct on image 28/12 with hypoenhancement in this area on postcontrast image 73/1001. DWI and ADC sequences are significantly limited by motion. Pancreas: No pancreatic ductal dilation or evidence of acute inflammation. Spleen:  No splenomegaly or focal splenic lesion. Adrenals/Urinary Tract: No suspicious adrenal nodule/mass. No hydronephrosis. Stomach/Bowel: Visualized portions within the abdomen are unremarkable. Vascular/Lymphatic: No pathologically enlarged lymph nodes identified. No abdominal aortic aneurysm demonstrated. Other:  No significant abdominal free fluid. Musculoskeletal: No suspicious bone lesions identified. IMPRESSION: Motion degraded examination limits sensitivity and specificity. Within this context: 1. Marked dilation of the common bile duct with mild prominence of the central intrahepatic biliary tree. No discrete choledocholithiasis. Question of Chidinma Clites soft tissue mass along the duodenal wall and distal common duct with hypoenhancement in this area on postcontrast sequence. DWI and ADC sequences are significantly limited by motion.  Recommend further evaluation with ERCP. 2. Gallbladder is distended without evidence of acute cholecystitis. 3. Diffuse hepatic steatosis. 4. Trace right pleural effusion. Electronically Signed   By: Reyes Holder M.D.   On: 01/10/2024 14:09   MR 3D Recon At Scanner Result Date: 01/10/2024 CLINICAL DATA:  Dilated bile ducts. EXAM: MRI ABDOMEN WITHOUT AND WITH CONTRAST (INCLUDING MRCP) TECHNIQUE: Multiplanar multisequence MR imaging of the abdomen was performed both before and after the administration of intravenous contrast. Heavily T2-weighted images of the biliary and pancreatic ducts were  obtained, and three-dimensional MRCP images were rendered by post processing. CONTRAST:  7.5mL GADAVIST  GADOBUTROL  1 MMOL/ML IV SOLN COMPARISON:  Multiple priors including CTA chest Oct 15, 2022 and CT abdomen pelvis August 23, 2010. FINDINGS: Lower chest: Trace right pleural effusion. Hepatobiliary: Diffuse hepatic steatosis. No suspicious hepatic lesion. Distended gallbladder.  No evidence of acute cholecystitis. Marked dilation of the common bile duct with mild prominence of the central intrahepatic biliary tree. Common duct measures up to 2.1 cm in diameter. No discrete choledocholithiasis. Question of Hadja Harral soft tissue mass along the duodenal wall and distal common duct on image 28/12 with hypoenhancement in this area on postcontrast image 73/1001. DWI and ADC sequences are significantly limited by motion. Pancreas: No pancreatic ductal dilation or evidence of acute inflammation. Spleen:  No splenomegaly or focal splenic lesion. Adrenals/Urinary Tract: No suspicious adrenal nodule/mass. No hydronephrosis. Stomach/Bowel: Visualized portions within the abdomen are unremarkable. Vascular/Lymphatic: No pathologically enlarged lymph nodes identified. No abdominal aortic aneurysm demonstrated. Other:  No significant abdominal free fluid. Musculoskeletal: No suspicious bone lesions identified. IMPRESSION: Motion degraded examination limits sensitivity and specificity. Within this context: 1. Marked dilation of the common bile duct with mild prominence of the central intrahepatic biliary tree. No discrete choledocholithiasis. Question of Arlin Savona soft tissue mass along the duodenal wall and distal common duct with hypoenhancement in this area on postcontrast sequence. DWI and ADC sequences are significantly limited by motion. Recommend further evaluation with ERCP. 2. Gallbladder is distended without evidence of acute cholecystitis. 3. Diffuse hepatic steatosis. 4. Trace right pleural effusion. Electronically Signed   By: Reyes Holder M.D.   On: 01/10/2024 14:09        Scheduled Meds:  furosemide   20 mg Oral Daily   metoprolol  tartrate  50 mg Oral BID   sodium chloride  flush  3 mL Intravenous Q12H   Continuous Infusions:   LOS: 0 days    Time spent: over 30 min    Meliton Monte, MD [facility]   To contact the attending provider between 7A-7P or the covering provider during after hours 7P-7A, please log into the web site [email or web address] and access using universal [place] password for that web site. If you do not have the password, please call the hospital operator.  01/11/2024, 9:10 AM

## 2024-01-11 NOTE — Consult Note (Signed)
 Referring Provider: TH Primary Care Physician:  Rolinda Millman, MD Primary Gastroenterologist: Margarete primary  Reason for Consultation: Jaundice  HPI: Eric [name] is a 89 y.o. male with past medical history of atypical atrial flutter currently on Eliquis , history of bioprosthetic mitral valve repair/TMVR December 19, 2023 on endocarditis prophylaxis with amoxicillin  history of coronary artery disease,  presented to the hospital for abnormal labs with jaundice.  Patient with history of mildly elevated T. bili in the past with T. bili of around 2.5 in 2020 and around 3.9 in 2022.  His bilirubin has been in range of 4-5 since February 2025.  Normal other LFTs in the past.  Blood work yesterday showed worsening of T. bili to 8.1 with elevated other LFTs with alk phos of 132, AST of 104 and ALT of 89.  CT angio abdomen pelvis with contrast in June 2025 showed normal-appearing liver and pancreas without any biliary dilation.  MRI MRCP yesterday showed mild dilation of common bile duct up to 2.1 cm with questionable soft tissue mass along the duodenal wall And in the distal common bile duct.   Patient seen and examined at bedside.  Denies any GI symptoms.  Started noticing dark urine around 3 weeks ago.  Also having light-colored stools.     Past Medical History:  Diagnosis Date   Atrial flutter, paroxysmal (HCC)    a. dx 04-02-2014, s/p  successful cardioversion 04-06-2014.   Chronic anticoagulation    on Eliquis --  due to recurrent dvt's and pe's   Dyslipidemia    History of bladder cancer urologist-  dr chauncey   02-13-2016  s/p TURBT per path high grade papillary urothelial carcinoma   History of DVT (deep vein thrombosis)    2000-- RLE   History of melanoma excision    left flank;  07/ 2014 left lower leg and right upper chest   History of prostate cancer    Gleason 6--  s/p  radical prostatectomy 06/ 2000 in [place]---  no recurrence   History of pulmonary embolus (PE)    2000 &  2005  bilateral   Hx of valvuloplasty 06/19/2007   a. 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 05/2007 CABG x 1: s/p SVG-OM;  b. 10/2010 Ex MV: EF 72%, inf attenuation w/o ischemia, brief run of PAT with exercise. To   Stroke (HCC)    Thrombocytopenia (HCC)    Thyroid  goiter 2013   nodular   Wears hearing aid    BILATERAL   Wears partial dentures    UPPER    Past Surgical History:  Procedure Laterality Date   AORTIC VALVE REPLACEMENT (AVR)/CORONARY ARTERY BYPASS GRAFTING (CABG)  06-19-2007  dr lucas   SVG to OM1 ;   AVR w/ #23 Mitralflow pericardial and ligation left atrial appendage;  MV repair w/ #28 Sorin 3-D memo Ring Annuloplasty with repair ruptured chordar of P-2 flail segments   CARDIAC CATHETERIZATION  05-12-2007  dr malva   single vessel 30% ostial LAD,  80% LCx;  severe to critial AS,  moderate MR,  normal LVF, ef 70%,  normal right heart pressures and cardiac outputs,  mild elevated LV end-diastolic pressure     CARDIOVASCULAR STRESS TEST  11-02-2010  dr dann   normal nuclear study w/ no ischemia or infarct/scar/  normal LV function and wall motion , ef 72%   CARDIOVERSION N/A 04/06/2014   Procedure: CARDIOVERSION;  Surgeon: Leim VEAR Moose, MD;  Location: [facility] ENDOSCOPY;  Service: Cardiovascular;  Laterality: N/A;   successful   CATARACT EXTRACTION W/ INTRAOCULAR LENS  IMPLANT, BILATERAL  2012  approx   CORONARY ARTERY BYPASS GRAFT     PROSTATECTOMY     RETROPUBIC RADICAL PROSTATECTOMY  06/ 2000  in [place], [place]   RIGHT/LEFT HEART CATH AND CORONARY/GRAFT ANGIOGRAPHY N/A 10/29/2018   Procedure: RIGHT/LEFT HEART CATH AND CORONARY/GRAFT ANGIOGRAPHY;  Surgeon: Wonda Sharper, MD;  Location: [facility] INVASIVE CV LAB;  Service:  Cardiovascular;  Laterality: N/A;   RIGHT/LEFT HEART CATH AND CORONARY/GRAFT ANGIOGRAPHY N/A 10/04/2023   Procedure: RIGHT/LEFT HEART CATH AND CORONARY/GRAFT ANGIOGRAPHY;  Surgeon: Wonda Sharper, MD;  Location: [facility] INVASIVE CV LAB;  Service: Cardiovascular;  Laterality: N/A;   ROTATOR CUFF REPAIR Left 09/1998   TEE WITHOUT CARDIOVERSION N/A 04/06/2014   Procedure: TRANSESOPHAGEAL ECHOCARDIOGRAM (TEE);  Surgeon: Leim VEAR Moose, MD;  Location: [facility] ENDOSCOPY;  Service: Cardiovascular;  Laterality: N/A;  mild focal basa LVH of the septum, ef 50-55%, s/p AV annuloplasty ring, elongated chordae, thickened leaflets, mild to mod. MR, multiple small jets/arotic bioprosthetic valve sits well in position, mild AI/ thickened TV w/ mild reurg./mild LA   TEE WITHOUT CARDIOVERSION N/A 10/22/2018   Procedure: TRANSESOPHAGEAL ECHOCARDIOGRAM (TEE);  Surgeon: Jeffrie Oneil BROCKS, MD;  Location: [facility] ENDOSCOPY;  Service: Cardiovascular;  Laterality: N/A;   TEE WITHOUT CARDIOVERSION N/A 11/25/2018   Procedure: TRANSESOPHAGEAL ECHOCARDIOGRAM (TEE);  Surgeon: Wonda Sharper, MD;  Location: [facility] OR;  Service: Open Heart Surgery;  Laterality: N/A;   TRANSCATHETER AORTIC VALVE REPLACEMENT, TRANSFEMORAL N/A 11/25/2018   Procedure: TRANSCATHETER AORTIC VALVE REPLACEMENT, TRANSFEMORAL;  Surgeon: Wonda Sharper, MD;  Location: [facility] OR;  Service: Open Heart Surgery;  Laterality: N/A;   TRANSESOPHAGEAL ECHOCARDIOGRAM (CATH LAB) N/A 10/04/2023   Procedure: TRANSESOPHAGEAL ECHOCARDIOGRAM;  Surgeon: Santo Stanly LABOR, MD;  Location: [facility] INVASIVE CV LAB;  Service: Cardiovascular;  Laterality: N/A;   TRANSTHORACIC ECHOCARDIOGRAM  03-23-2016   dr dann   EF 55-60%, reduced contribution atrial contraction to ventricular filling, due to increased ventricular diastolic pressure or atrial contratile dysfunction/  bioprosthetic AV w/ mod. regurg. (valve area 1.97cm^2)/ mild dilated ascending aorta/ mild thicken MV normal function annular ring  prosthesis/severe LAE & mod. to sev.RAE/ PASP 44mmHg/ mild TR/ ventricle septal motion show paradox   TRANSURETHRAL RESECTION OF BLADDER TUMOR N/A 02/13/2016   Procedure: TRANSURETHRAL RESECTION OF BLADDER TUMOR (TURBT);  Surgeon: Redell Lynwood Napoleon, MD;  Location: [facility];  Service: Urology;  Laterality: N/A;   TRANSURETHRAL RESECTION OF BLADDER TUMOR N/A 09/04/2016   Procedure: TRANSURETHRAL RESECTION OF BLADDER TUMOR (TURBT);  Surgeon: Redell Lynwood Napoleon, MD;  Location: [facility];  Service: Urology;  Laterality: N/A;    Prior to Admission medications   Medication Sig Start Date End Date Taking? Authorizing Provider  amoxicillin  (AMOXIL ) 500 MG tablet Take 4 tablets (2,000 mg total) by mouth as directed. Take 1 hour prior to dental work, including cleanings. 04/27/19  Yes dann Candyce RAMAN, MD  apixaban  (ELIQUIS ) 5 MG TABS tablet TAKE 1 TABLET BY MOUTH TWICE  DAILY 10/08/23  Yes Wonda Sharper, MD  Ascorbic Acid (VITAMIN C PO) Take 500 mg by mouth in the morning. Gummies 250 mg each   Yes [provider]  atorvastatin  (LIPITOR) 20 MG  tablet Take 40 mg by mouth every evening.   Yes [provider]  diltiazem  (CARDIZEM ) 30 MG tablet Take 1 tablet (30 mg total) by mouth daily as needed. 12/30/23  Yes Sebastian Lamarr SAUNDERS, PA-C  Eyelid Cleansers (OCUSOFT EYELID CLEANSING) PADS Place 1 application into the left eye in the morning.   Yes [provider]  ezetimibe  (ZETIA ) 10 MG tablet TAKE 1 TABLET BY MOUTH IN THE  EVENING 12/27/23  Yes Wonda Sharper, MD  furosemide  (LASIX ) 20 MG tablet Take 1 tablet (20 mg total) by mouth daily. 08/09/23  Yes Ladona Heinz, MD  losartan  (COZAAR ) 25 MG tablet Take 0.5 tablets (12.5 mg total) by mouth at bedtime. 12/30/23  Yes Sebastian Lamarr SAUNDERS, PA-C  metoprolol  tartrate (LOPRESSOR ) 25 MG tablet Take 2 tablets (50 mg total) by mouth 2 (two) times daily. 12/30/23  Yes Sebastian Lamarr SAUNDERS, PA-C  nitroGLYCERIN   (NITROSTAT ) 0.4 MG SL tablet Place 1 tablet (0.4 mg total) under the tongue every 5 (five) minutes as needed. 02/04/23 01/11/24 Yes Dann Candyce RAMAN, MD  spironolactone  (ALDACTONE ) 25 MG tablet Take 0.5 tablets (12.5 mg total) by mouth in the morning. 12/30/23  Yes Sebastian Lamarr SAUNDERS, PA-C    Scheduled Meds:  furosemide   20 mg Oral Daily   metoprolol  tartrate  50 mg Oral BID   sodium chloride  flush  3 mL Intravenous Q12H   Continuous Infusions:  heparin  1,300 Units/hr (01/11/24 1127)   PRN Meds:.mouth rinse  Allergies as of 01/10/2024   (No Known Allergies)    Family History  Problem Relation Age of Onset   Leukemia Father    Supraventricular tachycardia Sister    Heart attack Neg Hx    Stroke Neg Hx    Hypertension Neg Hx     Social History   Socioeconomic History   Marital status: Married    Spouse name: Ann   Number of children: 2   Years of education: Not on file   Highest education level: Some college, no degree  Occupational History   Occupation: Retired  Tobacco Use   Smoking status: Never   Smokeless tobacco: Never  Vaping Use   Vaping status: Never Used  Substance and Sexual Activity   Alcohol use: Yes    Alcohol/week: 1.0 - 2.0 standard drink of alcohol    Types: 1 - 2 Standard drinks or equivalent per week    Comment: OCC   Drug use: No   Sexual activity: Not on file  Other Topics Concern   Not on file  Social History Narrative   01/03/21 Lives in [place] with wife.  Active.   Caffeine- 1-2 c coffee daily   Social Drivers of Corporate investment banker Strain: Low Risk  (12/19/2023)   Received from [facility]   Overall Financial Resource Strain (CARDIA)    Difficulty of Paying Living Expenses: Not hard at all  Food Insecurity: No Food Insecurity (01/10/2024)   Hunger Vital Sign    Worried About Running Out of Food in the Last Year: Never true    Ran Out of Food in the Last Year: Never true  Transportation Needs: No Transportation Needs  (01/10/2024)   PRAPARE - Administrator, Civil Service (Medical): No    Lack of Transportation (Non-Medical): No  Physical Activity: Insufficiently Active (12/19/2023)   Received [facility]   Exercise Vital Sign    On average, how many days per week do you engage in moderate to strenuous exercise (  like a brisk walk)?: 2 days    On average, how many minutes do you engage in exercise at this level?: 30 min  Stress: Patient Unable To Answer (12/19/2023)   Received from [facility] - Occupational Stress Questionnaire    Feeling of Stress : Patient unable to answer  Social Connections: Socially Integrated (01/10/2024)   Social Connection and Isolation Panel    Frequency of Communication with Friends and Family: Three times a week    Frequency of Social Gatherings with Friends and Family: Once a week    Attends Religious Services: More than 4 times per year    Active Member of [employer] or Organizations: Yes    Attends Banker Meetings: 1 to 4 times per year    Marital Status: Married  Catering manager Violence: Not At Risk (01/10/2024)   Humiliation, Afraid, Rape, and Kick questionnaire    Fear of Current or Ex-Partner: No    Emotionally Abused: No    Physically Abused: No    Sexually Abused: No    Review of Systems: All negative except as stated above in HPI.  Physical Exam: Vital signs: Vitals:   01/11/24 0750 01/11/24 1143  BP: 95/63 115/64  Pulse: 82 (!) 34  Resp: 18   Temp: 98 F (36.7 C) 97.9 F (36.6 C)  SpO2: 98% 98%   Last BM Date : 01/10/24 General:   Alert,  Well-developed, well-nourished, pleasant and cooperative in NAD Normocephalic.  Atraumatic Scleral icterus noted Lungs: No visible respiratory distress Heart:  Regular rate and rhythm; no murmurs, clicks, rubs,  or gallops. Abdomen: Soft, nontender, nondistended, bowel sound present, no peritoneal signs Mood and affect normal Alert and  oriented x 3 Rectal:  Deferred  GI:  Lab Results: Recent Labs    01/10/24 1648 01/11/24 0252  WBC 4.7 7.6  HGB 10.9* 11.0*  HCT 33.8* 33.2*  PLT 130* 104*   BMET Recent Labs    01/10/24 1648 01/11/24 0252  NA 137 139  K 3.7 3.5  CL 108 109  CO2 20* 21*  GLUCOSE 136* 92  BUN 27* 25*  CREATININE 1.23 1.06  CALCIUM  9.3 9.1   LFT Recent Labs    01/11/24 0252  PROT 5.1*  ALBUMIN 3.5  AST 93*  ALT 72*  ALKPHOS 116  BILITOT 8.6*   PT/INR Recent Labs    01/10/24 1739 01/11/24 0252  LABPROT 19.8* 18.6*  INR 1.6* 1.5*     Studies/Results: MR ABDOMEN MRCP W WO CONTAST Result Date: 01/10/2024 CLINICAL DATA:  Dilated bile ducts. EXAM: MRI ABDOMEN WITHOUT AND WITH CONTRAST (INCLUDING MRCP) TECHNIQUE: Multiplanar multisequence MR imaging of the abdomen was performed both before and after the administration of intravenous contrast. Heavily T2-weighted images of the biliary and pancreatic ducts were obtained, and three-dimensional MRCP images were rendered by post processing. CONTRAST:  7.5mL GADAVIST  GADOBUTROL  1 MMOL/ML IV SOLN COMPARISON:  Multiple priors including CTA chest Oct 15, 2022 and CT abdomen pelvis August 23, 2010. FINDINGS: Lower chest: Trace right pleural effusion. Hepatobiliary: Diffuse hepatic steatosis. No suspicious hepatic lesion. Distended gallbladder.  No evidence of acute cholecystitis. Marked dilation of the common bile duct with mild prominence of the central intrahepatic biliary tree. Common duct measures up to 2.1 cm in diameter. No discrete choledocholithiasis. Question of a soft tissue mass along the duodenal wall and distal common duct on image 28/12 with hypoenhancement in this area on postcontrast image 73/1001. DWI and ADC  sequences are significantly limited by motion. Pancreas: No pancreatic ductal dilation or evidence of acute inflammation. Spleen:  No splenomegaly or focal splenic lesion. Adrenals/Urinary Tract: No suspicious adrenal nodule/mass.  No hydronephrosis. Stomach/Bowel: Visualized portions within the abdomen are unremarkable. Vascular/Lymphatic: No pathologically enlarged lymph nodes identified. No abdominal aortic aneurysm demonstrated. Other:  No significant abdominal free fluid. Musculoskeletal: No suspicious bone lesions identified. IMPRESSION: Motion degraded examination limits sensitivity and specificity. Within this context: 1. Marked dilation of the common bile duct with mild prominence of the central intrahepatic biliary tree. No discrete choledocholithiasis. Question of a soft tissue mass along the duodenal wall and distal common duct with hypoenhancement in this area on postcontrast sequence. DWI and ADC sequences are significantly limited by motion. Recommend further evaluation with ERCP. 2. Gallbladder is distended without evidence of acute cholecystitis. 3. Diffuse hepatic steatosis. 4. Trace right pleural effusion. Electronically Signed   By: Reyes Holder M.D.   On: 01/10/2024 14:09   MR 3D Recon At Scanner Result Date: 01/10/2024 CLINICAL DATA:  Dilated bile ducts. EXAM: MRI ABDOMEN WITHOUT AND WITH CONTRAST (INCLUDING MRCP) TECHNIQUE: Multiplanar multisequence MR imaging of the abdomen was performed both before and after the administration of intravenous contrast. Heavily T2-weighted images of the biliary and pancreatic ducts were obtained, and three-dimensional MRCP images were rendered by post processing. CONTRAST:  7.5mL GADAVIST  GADOBUTROL  1 MMOL/ML IV SOLN COMPARISON:  Multiple priors including CTA chest Oct 15, 2022 and CT abdomen pelvis August 23, 2010. FINDINGS: Lower chest: Trace right pleural effusion. Hepatobiliary: Diffuse hepatic steatosis. No suspicious hepatic lesion. Distended gallbladder.  No evidence of acute cholecystitis. Marked dilation of the common bile duct with mild prominence of the central intrahepatic biliary tree. Common duct measures up to 2.1 cm in diameter. No discrete choledocholithiasis.  Question of a soft tissue mass along the duodenal wall and distal common duct on image 28/12 with hypoenhancement in this area on postcontrast image 73/1001. DWI and ADC sequences are significantly limited by motion. Pancreas: No pancreatic ductal dilation or evidence of acute inflammation. Spleen:  No splenomegaly or focal splenic lesion. Adrenals/Urinary Tract: No suspicious adrenal nodule/mass. No hydronephrosis. Stomach/Bowel: Visualized portions within the abdomen are unremarkable. Vascular/Lymphatic: No pathologically enlarged lymph nodes identified. No abdominal aortic aneurysm demonstrated. Other:  No significant abdominal free fluid. Musculoskeletal: No suspicious bone lesions identified. IMPRESSION: Motion degraded examination limits sensitivity and specificity. Within this context: 1. Marked dilation of the common bile duct with mild prominence of the central intrahepatic biliary tree. No discrete choledocholithiasis. Question of a soft tissue mass along the duodenal wall and distal common duct with hypoenhancement in this area on postcontrast sequence. DWI and ADC sequences are significantly limited by motion. Recommend further evaluation with ERCP. 2. Gallbladder is distended without evidence of acute cholecystitis. 3. Diffuse hepatic steatosis. 4. Trace right pleural effusion. Electronically Signed   By: Reyes Holder M.D.   On: 01/10/2024 14:09    Impression/Plan: -Obstructive jaundice with MRI concerning for soft tissue mass along the duodenal wall and distal common bile duct.  - History of atypical atrial flutter.  Last dose of Eliquis  yesterday morning. - History of bioprosthetic mitral valve repair on December 19, 2023  Recommendations ----------------------- - Tentative plan for ERCP with brushings/biopsy and possible stent placement on Monday. - Okay to have heart healthy diet for now. - GI will follow     LOS: 0 days   Eric Lah  MD, FACP 01/11/2024, 11:45 AM  Contact #   [phone]

## 2024-01-11 NOTE — Plan of Care (Signed)

## 2024-01-11 NOTE — H&P (Deleted)
 PROGRESS NOTE    Eric [name]  FMW:[id number] DOB: 13-Jul-1929 DOA: 01/10/2024 PCP: Rolinda Millman, MD  Chief Complaint  Patient presents with   labs high    Brief Narrative:   Eric [name] is Eric [name] 89 y.o. male with Zaquan Duffner history of persistent AFib, bladder CA s/p TURBT, remote DVT (2000), stroke, CAD s/p 1v CABG and bioprosthetic AVR and MVR 2009, then valve-in-valve TAVR 2020, then transseptal transcatheter valve in ring TMVR with LAMPOON at [facility] 12/19/2023 who presented to the ED today on the advice of his PCP due to abnormal MRCP. He had been recovering well from [facility], presented to routine dermatology visit earlier this week, was noted to be jaundiced, labs confirmed hyperbilirubinemia and subsequent MRCP performed this morning showed concern for an obstructive soft tissue mass. ERCP was recommended and pt told to proceed to ED. [facility] was consulted by EDP, will consult in AM.    He reports mild upper abdominal discomfort that was fleeting but none now. Has darker urine of late. Limited appetite of late but no nausea or vomiting, normal BMs. No unintentional weight loss, fevers, night sweats.    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 Of soft tissue mass along duodenal wall and distal common duct with hypoenhancement in this area on post contrast sequence.  Recommended further eval with ERCP.   EDP consulted GI, will follow up with them  this morning regarding timing of any procedure, he's currently NPO Last dose of eliquis  was 8/22 am, will bridge with heparin    Transcatheter transeptal mitral valve replacement (12/19/2023) History of Severe MVR s/p MVR/AVR/CABGx1 (2009) Hx TAVR History of Atrial Fibrillation  Chadsvasc at least 5, will plan for bridging anticoagulation with heparin  while we hold eliquis  With his recent procedure, will consult cardiology  HFpEF Continue lasix  and metop for now ARB and spironolactone  currently on hold   CAD Dyslipidemia Statin and zetia  on hold Anticoagulation on hold  Thrombocytopenia Will trend, was low early August in care everywhere labs Values here are similar, mildly improved Will continue to monitor      DVT prophylaxis: heparin  gtt Code Status: full Family Communication: wife at bedside Disposition:   Status is: Observation The patient remains OBS appropriate and will d/c before 2 midnights.   Consultants:  GI cardiology  Procedures:  none  Antimicrobials:  Anti-infectives (From admission, onward)    None       Subjective: No complaints  Objective: Vitals:   01/10/24 2019 01/10/24 2341 01/11/24 0319 01/11/24 0750  BP: (!) 159/83 (!) 143/85 128/81 95/63  Pulse: (!) 44 68 76 82  Resp: 18 19 19 18   Temp: 97.9 F (36.6 C) 97.9 F (36.6 C) 98 F (36.7 C) 98 F (36.7 C)  TempSrc: Oral Oral Oral Oral  SpO2: 99% 97% 98% 98%  Weight: 81.6 kg  77.6 kg   Height: 6' (1.829 m)       Intake/Output Summary (Last 24  hours) at 01/11/2024 0848 Last data filed at 01/11/2024 0319 Gross per 24 hour  Intake --  Output 325 ml  Net -325 ml   Filed Weights   01/10/24 1643 01/10/24 2019 01/11/24 0319  Weight: 78.9 kg 81.6 kg 77.6 kg    Examination:  General exam: Appears calm and comfortable - jaundiced Respiratory system: Clear to auscultation. Respiratory effort normal. Cardiovascular system: S1 & S2 heard, RRR.  Gastrointestinal system: Abdomen is  nondistended, soft and nontender. No organomegaly or masses felt. Normal bowel sounds heard. Central nervous system: Alert and oriented. No focal neurological deficits. Extremities:no LEE Skin: No rashes, lesions or ulcers Psychiatry: Judgement and insight appear normal. Mood & affect appropriate.     Data Reviewed: I have personally reviewed following labs and imaging studies  CBC: Recent Labs  Lab 01/10/24 1648 01/11/24 0252  WBC 4.7 7.6  HGB 10.9* 11.0*  HCT 33.8* 33.2*  MCV 99.7 98.8  PLT 130* 104*    Basic Metabolic Panel: Recent Labs  Lab 01/10/24 1648 01/11/24 0252  NA 137 139  K 3.7 3.5  CL 108 109  CO2 20* 21*  GLUCOSE 136* 92  BUN 27* 25*  CREATININE 1.23 1.06  CALCIUM  9.3 9.1    GFR: Estimated Creatinine Clearance: 48.8 mL/min (by C-G formula based on SCr of 1.06 mg/dL).  Liver Function Tests: Recent Labs  Lab 01/10/24 1648 01/11/24 0252  AST 104* 93*  ALT 89* 72*  ALKPHOS 132* 116  BILITOT 8.1* 8.6*  PROT 6.9 5.1*  ALBUMIN 3.8 3.5    CBG: No results for input(s): GLUCAP in the last 168 hours.   No results found for this or any previous visit (from the past 240 hours).       Radiology Studies: MR ABDOMEN MRCP W WO CONTAST Result Date: 01/10/2024 CLINICAL DATA:  Dilated bile ducts. EXAM: MRI ABDOMEN WITHOUT AND WITH CONTRAST (INCLUDING MRCP) TECHNIQUE: Multiplanar multisequence MR imaging of the abdomen was performed both before and after the administration of intravenous contrast. Heavily T2-weighted images of the biliary and pancreatic ducts were obtained, and three-dimensional MRCP images were rendered by post processing. CONTRAST:  7.5mL GADAVIST  GADOBUTROL  1 MMOL/ML IV SOLN COMPARISON:  Multiple priors including CTA chest Oct 15, 2022 and CT abdomen pelvis August 23, 2010. FINDINGS: Lower chest: Trace right pleural effusion. Hepatobiliary: Diffuse hepatic steatosis. No suspicious hepatic lesion. Distended gallbladder.  No evidence of  acute cholecystitis. Marked dilation of the common bile duct with mild prominence of the central intrahepatic biliary tree. Common duct measures up to 2.1 cm in diameter. No discrete choledocholithiasis. Question of Tiffeny Minchew soft tissue mass along the duodenal wall and distal common duct on image 28/12 with hypoenhancement in this area on postcontrast image 73/1001. DWI and ADC sequences are significantly limited by motion. Pancreas: No pancreatic ductal dilation or evidence of acute inflammation. Spleen:  No splenomegaly or focal splenic lesion. Adrenals/Urinary Tract: No suspicious adrenal nodule/mass. No hydronephrosis. Stomach/Bowel: Visualized portions within the abdomen are unremarkable. Vascular/Lymphatic: No pathologically enlarged lymph nodes identified. No abdominal aortic aneurysm demonstrated. Other:  No significant abdominal free fluid. Musculoskeletal: No suspicious bone lesions identified. IMPRESSION: Motion degraded examination limits sensitivity and specificity. Within this context: 1. Marked dilation of the common bile duct with mild prominence of the central intrahepatic biliary tree. No discrete choledocholithiasis. Question of Kieli Golladay soft tissue mass along the duodenal wall and distal common duct with hypoenhancement in this area on postcontrast sequence. DWI and ADC sequences are significantly limited by motion.  Recommend further evaluation with ERCP. 2. Gallbladder is distended without evidence of acute cholecystitis. 3. Diffuse hepatic steatosis. 4. Trace right pleural effusion. Electronically Signed   By: Reyes Holder M.D.   On: 01/10/2024 14:09   MR 3D Recon At Scanner Result Date: 01/10/2024 CLINICAL DATA:  Dilated bile ducts. EXAM: MRI ABDOMEN WITHOUT AND WITH CONTRAST (INCLUDING MRCP) TECHNIQUE: Multiplanar multisequence MR imaging of the abdomen was performed both before and after the administration of intravenous contrast. Heavily T2-weighted images of the biliary and pancreatic ducts were  obtained, and three-dimensional MRCP images were rendered by post processing. CONTRAST:  7.5mL GADAVIST  GADOBUTROL  1 MMOL/ML IV SOLN COMPARISON:  Multiple priors including CTA chest Oct 15, 2022 and CT abdomen pelvis August 23, 2010. FINDINGS: Lower chest: Trace right pleural effusion. Hepatobiliary: Diffuse hepatic steatosis. No suspicious hepatic lesion. Distended gallbladder.  No evidence of acute cholecystitis. Marked dilation of the common bile duct with mild prominence of the central intrahepatic biliary tree. Common duct measures up to 2.1 cm in diameter. No discrete choledocholithiasis. Question of Dewaun Kinzler soft tissue mass along the duodenal wall and distal common duct on image 28/12 with hypoenhancement in this area on postcontrast image 73/1001. DWI and ADC sequences are significantly limited by motion. Pancreas: No pancreatic ductal dilation or evidence of acute inflammation. Spleen:  No splenomegaly or focal splenic lesion. Adrenals/Urinary Tract: No suspicious adrenal nodule/mass. No hydronephrosis. Stomach/Bowel: Visualized portions within the abdomen are unremarkable. Vascular/Lymphatic: No pathologically enlarged lymph nodes identified. No abdominal aortic aneurysm demonstrated. Other:  No significant abdominal free fluid. Musculoskeletal: No suspicious bone lesions identified. IMPRESSION: Motion degraded examination limits sensitivity and specificity. Within this context: 1. Marked dilation of the common bile duct with mild prominence of the central intrahepatic biliary tree. No discrete choledocholithiasis. Question of Kaylanni Ezelle soft tissue mass along the duodenal wall and distal common duct with hypoenhancement in this area on postcontrast sequence. DWI and ADC sequences are significantly limited by motion. Recommend further evaluation with ERCP. 2. Gallbladder is distended without evidence of acute cholecystitis. 3. Diffuse hepatic steatosis. 4. Trace right pleural effusion. Electronically Signed   By: Reyes Holder M.D.   On: 01/10/2024 14:09        Scheduled Meds:  furosemide   20 mg Oral Daily   metoprolol  tartrate  50 mg Oral BID   sodium chloride  flush  3 mL Intravenous Q12H   Continuous Infusions:   LOS: 0 days    Time spent: over 30 min    Meliton Monte, MD [facility]   To contact the attending provider between 7A-7P or the covering provider during after hours 7P-7A, please log into the web site [email or web address] and access using universal [place] password for that web site. If you do not have the password, please call the hospital operator.  01/11/2024, 8:48 AM

## 2024-01-11 NOTE — Plan of Care (Signed)

## 2024-01-11 NOTE — Progress Notes (Signed)
 PHARMACY - ANTICOAGULATION CONSULT NOTE  Pharmacy Consult for IV heparin  Indication: atrial fibrillation  No Known Allergies  Patient Measurements: Height: 6' (182.9 cm) Weight: 77.6 kg (171 lb 1.2 oz) IBW/kg (Calculated) : 77.6 HEPARIN  DW (KG): 81.6  Vital Signs: Temp: 97.9 F (36.6 C) (08/23 1914) Temp Source: Oral (08/23 1914) BP: 109/78 (08/23 1914) Pulse Rate: 70 (08/23 1914)  Labs: Recent Labs    01/10/24 1648 01/10/24 1739 01/11/24 0252 01/11/24 1038 01/11/24 1825  HGB 10.9*  --  11.0*  --   --   HCT 33.8*  --  33.2*  --   --   PLT 130*  --  104*  --   --   APTT  --  34  --   --  146*  LABPROT  --  19.8* 18.6*  --   --   INR  --  1.6* 1.5*  --   --   HEPARINUNFRC  --   --   --  >1.10* >1.10*  CREATININE 1.23  --  1.06  --   --     Estimated Creatinine Clearance: 48.8 mL/min (by C-G formula based on SCr of 1.06 mg/dL).   Medical History: Past Medical History:  Diagnosis Date   Atrial flutter, paroxysmal (HCC)    a. dx 04-02-2014, s/p  successful cardioversion 04-06-2014.   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 05/2007 CABG x 1: s/p  SVG-OM;  b. 10/2010 Ex MV: EF 72%, inf attenuation w/o ischemia, brief run of PAT with exercise. To   Stroke (HCC)    Thrombocytopenia (HCC)    Thyroid  goiter 2013   nodular   Wears hearing aid    BILATERAL   Wears partial dentures    UPPER    Medications:  Medications Prior to Admission  Medication Sig Dispense Refill Last Dose/Taking   amoxicillin  (AMOXIL ) 500 MG tablet Take 4 tablets (2,000 mg total) by mouth as directed. Take 1 hour prior to dental work, including cleanings. 4 tablet 6 Unknown   apixaban  (ELIQUIS ) 5 MG TABS tablet TAKE 1 TABLET BY MOUTH TWICE  DAILY 180 tablet 2 01/10/2024 at  7:00 AM   Ascorbic Acid (VITAMIN C PO) Take 500 mg by mouth in the morning. Gummies 250 mg each   01/10/2024   atorvastatin  (LIPITOR) 20 MG tablet Take 40 mg by mouth every evening.   Past Week   diltiazem  (CARDIZEM ) 30 MG tablet Take 1 tablet (30 mg total) by mouth daily as needed. 90 tablet 0 Unknown  Eyelid Cleansers (OCUSOFT EYELID CLEANSING) PADS Place 1 application into the left eye in the morning.   01/10/2024   ezetimibe  (ZETIA ) 10 MG tablet TAKE 1 TABLET BY MOUTH IN THE  EVENING 90 tablet 3 Past Week   furosemide  (LASIX ) 20 MG tablet Take 1 tablet (20 mg total) by mouth daily. 90 tablet 3 01/10/2024   losartan  (COZAAR ) 25 MG tablet Take 0.5 tablets (12.5 mg total) by mouth at bedtime. 45 tablet 1 Past Week   metoprolol  tartrate (LOPRESSOR ) 25 MG tablet Take 2 tablets (50 mg total) by mouth 2 (two) times daily. 180 tablet 1 01/10/2024   nitroGLYCERIN  (NITROSTAT ) 0.4 MG SL tablet Place 1 tablet (0.4 mg total) under the tongue every 5 (five) minutes as needed. 25 tablet 3 Unknown   spironolactone  (ALDACTONE ) 25 MG tablet Take 0.5 tablets (12.5 mg total) by mouth in the morning. 45 tablet 1 01/10/2024   Scheduled:   furosemide   20 mg Oral Daily   metoprolol  tartrate  50 mg Oral BID   sodium chloride  flush  3 mL Intravenous Q12H   Infusions:   heparin  Stopped (01/11/24 1935)   PRN: mouth  rinse  Assessment: 89 YO M with PMH significant for Atrial Fibrillation on Eliquis  (Last dose 08/22 AM), bladder CA s/p TURBT, remote DVT (2000), stroke, CAD s/p 1v CABG and bioprosthetic AVR and MVR 2009. Patient presents presented to [facility] for abnomal labs in T. Bili and LFTs found to be concerns for new soft tissue mass. Pharmacy consulted for heparin  for atrial fibrillation.  PM aPTT above goal 146 on 1300 units/hr. Discussed with RN and no overt bleeding reported. Will hold infusion for 1 hour and resume at lower rate.   Goal of Therapy:  Heparin  level 0.3-0.7 units/ml aPTT 66-102 seconds Monitor platelets by anticoagulation protocol: Yes   Plan:  Resume heparin  infusion at 1100 units/hr (starting tonight @ 2030) Obtain baseline aPTT and anti-Xa level Check aPTT and anti-Xa level in 8 hours and daily while on heparin  Daily CBC Continue to monitor H&H and platelets  Koren Or, PharmD Clinical Pharmacist 01/11/2024 7:42 PM Please check AMION for all [facility] Pharmacy numbers

## 2024-01-11 NOTE — Care Management Obs Status (Signed)
 MEDICARE OBSERVATION STATUS NOTIFICATION   Patient Details  Name: Eric [name] MRN: [id number] Date of Birth: January 13, 1930   Medicare Observation Status Notification Given:  Yes    Jon Cruel 01/11/2024, 2:34 PM

## 2024-01-12 DIAGNOSIS — Z9889 Other specified postprocedural states: Secondary | ICD-10-CM | POA: Diagnosis not present

## 2024-01-12 DIAGNOSIS — Z952 Presence of prosthetic heart valve: Secondary | ICD-10-CM | POA: Diagnosis not present

## 2024-01-12 DIAGNOSIS — I4819 Other persistent atrial fibrillation: Secondary | ICD-10-CM | POA: Diagnosis not present

## 2024-01-12 DIAGNOSIS — K831 Obstruction of bile duct: Secondary | ICD-10-CM | POA: Diagnosis not present

## 2024-01-12 LAB — CBC WITH DIFFERENTIAL/PLATELET
Abs Immature Granulocytes: 0.01 K/uL (ref 0.00–0.07)
Basophils Absolute: 0 K/uL (ref 0.0–0.1)
Basophils Relative: 1 %
Eosinophils Absolute: 0 K/uL (ref 0.0–0.5)
Eosinophils Relative: 1 %
HCT: 30.7 % — ABNORMAL LOW (ref 39.0–52.0)
Hemoglobin: 10.3 g/dL — ABNORMAL LOW (ref 13.0–17.0)
Immature Granulocytes: 0 %
Lymphocytes Relative: 23 %
Lymphs Abs: 1 K/uL (ref 0.7–4.0)
MCH: 33.1 pg (ref 26.0–34.0)
MCHC: 33.6 g/dL (ref 30.0–36.0)
MCV: 98.7 fL (ref 80.0–100.0)
Monocytes Absolute: 0.4 K/uL (ref 0.1–1.0)
Monocytes Relative: 9 %
Neutro Abs: 2.9 K/uL (ref 1.7–7.7)
Neutrophils Relative %: 66 %
Platelets: 87 K/uL — ABNORMAL LOW (ref 150–400)
RBC: 3.11 MIL/uL — ABNORMAL LOW (ref 4.22–5.81)
RDW: 14.6 % (ref 11.5–15.5)
Smear Review: NORMAL
WBC: 4.4 K/uL (ref 4.0–10.5)
nRBC: 0 % (ref 0.0–0.2)

## 2024-01-12 LAB — MAGNESIUM: Magnesium: 1.8 mg/dL (ref 1.7–2.4)

## 2024-01-12 LAB — COMPREHENSIVE METABOLIC PANEL WITH GFR
ALT: 49 U/L — ABNORMAL HIGH (ref 0–44)
AST: 57 U/L — ABNORMAL HIGH (ref 15–41)
Albumin: 3.3 g/dL — ABNORMAL LOW (ref 3.5–5.0)
Alkaline Phosphatase: 94 U/L (ref 38–126)
Anion gap: 8 (ref 5–15)
BUN: 21 mg/dL (ref 8–23)
CO2: 21 mmol/L — ABNORMAL LOW (ref 22–32)
Calcium: 8.9 mg/dL (ref 8.9–10.3)
Chloride: 109 mmol/L (ref 98–111)
Creatinine, Ser: 1.04 mg/dL (ref 0.61–1.24)
GFR, Estimated: >60 mL/min (ref >60)
Glucose, Bld: 93 mg/dL (ref 70–99)
Potassium: 3.8 mmol/L (ref 3.5–5.1)
Sodium: 138 mmol/L (ref 135–145)
Total Bilirubin: 7.1 mg/dL — ABNORMAL HIGH (ref 0.0–1.2)
Total Protein: 6.1 g/dL — ABNORMAL LOW (ref 6.5–8.1)

## 2024-01-12 LAB — HEPARIN LEVEL (UNFRACTIONATED)
Heparin Unfractionated: >1.1 [IU]/mL — ABNORMAL HIGH (ref 0.30–0.70)
Heparin Unfractionated: 0.93 [IU]/mL — ABNORMAL HIGH (ref 0.30–0.70)

## 2024-01-12 LAB — PHOSPHORUS: Phosphorus: 3.1 mg/dL (ref 2.5–4.6)

## 2024-01-12 LAB — APTT
aPTT: 128 s — ABNORMAL HIGH (ref 24–36)
aPTT: 80 s — ABNORMAL HIGH (ref 24–36)

## 2024-01-12 MED ORDER — PIPERACILLIN-TAZOBACTAM 3.375 G IVPB
3.3750 g | Freq: Three times a day (TID) | INTRAVENOUS | Status: DC
  Administered 2024-01-12 – 2024-01-15 (×11): 3.375 g via INTRAVENOUS
  Filled 2024-01-12 (×11): qty 50

## 2024-01-12 NOTE — Progress Notes (Signed)
 PHARMACY - ANTICOAGULATION CONSULT NOTE  Pharmacy Consult for IV heparin  Indication: atrial fibrillation  No Known Allergies  Patient Measurements: Height: 6' (182.9 cm) Weight: 78.2 kg (172 lb 4.8 oz) IBW/kg (Calculated) : 77.6 HEPARIN  DW (KG): 81.6  Vital Signs: Temp: 98 F (36.7 C) (08/24 1521) Temp Source: Oral (08/24 1521) BP: 109/80 (08/24 1521) Pulse Rate: 75 (08/24 1521)  Labs: Recent Labs    01/10/24 1648 01/10/24 1648 01/10/24 1739 01/11/24 0252 01/11/24 1038 01/11/24 1825 01/12/24 0800 01/12/24 1755  HGB 10.9*  --   --  11.0*  --   --  10.3*  --   HCT 33.8*  --   --  33.2*  --   --  30.7*  --   PLT 130*  --   --  104*  --   --  87*  --   APTT  --    < > 34  --   --  146* 128* 80*  LABPROT  --   --  19.8* 18.6*  --   --   --   --   INR  --   --  1.6* 1.5*  --   --   --   --   HEPARINUNFRC  --   --   --   --    < > >1.10* >1.10* 0.93*  CREATININE 1.23  --   --  1.06  --   --  1.04  --    < > = values in this interval not displayed.    Estimated Creatinine Clearance: 49.7 mL/min (by C-G formula based on SCr of 1.04 mg/dL).   Medical History: Past Medical History:  Diagnosis Date   Atrial flutter, paroxysmal (HCC)    a. dx 04-02-2014, s/p  successful cardioversion 04-06-2014.   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 05/2007 CABG x 1: s/p SVG-OM;  b. 10/2010 Ex MV: EF 72%, inf attenuation w/o ischemia, brief run of PAT with exercise. To   Stroke (HCC)    Thrombocytopenia (HCC)    Thyroid  goiter 2013   nodular   Wears hearing aid    BILATERAL   Wears partial dentures    UPPER    Medications:  Medications Prior to Admission  Medication Sig Dispense Refill Last Dose/Taking   amoxicillin  (AMOXIL ) 500 MG tablet Take 4 tablets (2,000 mg total) by mouth as directed. Take 1 hour prior to dental work, including cleanings. 4 tablet 6 Unknown   apixaban  (ELIQUIS ) 5 MG TABS tablet TAKE  1 TABLET BY MOUTH TWICE  DAILY 180 tablet 2 01/10/2024 at  7:00 AM   Ascorbic Acid (VITAMIN C PO) Take 500 mg by mouth in the morning. Gummies 250 mg each   01/10/2024   atorvastatin  (LIPITOR) 20 MG tablet Take 40 mg by mouth every evening.   Past Week   diltiazem  (CARDIZEM ) 30 MG tablet Take 1 tablet (30 mg total) by mouth daily as needed. 90 tablet 0 Unknown   Eyelid Cleansers (OCUSOFT EYELID CLEANSING) PADS Place 1 application into the left eye in the morning.   01/10/2024   ezetimibe  (ZETIA ) 10 MG tablet TAKE 1 TABLET BY MOUTH IN THE  EVENING 90 tablet 3 Past Week   furosemide  (LASIX ) 20 MG tablet Take 1 tablet (20 mg total) by mouth daily. 90 tablet 3 01/10/2024   losartan  (COZAAR ) 25 MG tablet Take 0.5 tablets (12.5 mg total) by mouth at bedtime. 45 tablet 1 Past Week   metoprolol  tartrate (LOPRESSOR ) 25 MG tablet Take 2 tablets (50 mg total) by mouth 2 (two) times daily. 180 tablet 1 01/10/2024   nitroGLYCERIN  (NITROSTAT ) 0.4 MG SL tablet Place 1 tablet (0.4 mg total) under the tongue every 5 (five) minutes as needed. 25 tablet 3 Unknown   spironolactone  (ALDACTONE ) 25 MG tablet Take 0.5 tablets (12.5 mg total) by mouth in the morning. 45  tablet 1 01/10/2024   Scheduled:   metoprolol  tartrate  50 mg Oral BID   sodium chloride  flush  3 mL Intravenous Q12H   Infusions:   heparin  950 Units/hr (01/12/24 1900)   piperacillin -tazobactam (ZOSYN )  IV 12.5 mL/hr at 01/12/24 1900   PRN: mouth rinse  Assessment: 89 YO M with PMH significant for Atrial Fibrillation on Eliquis  (Last dose 08/22 AM), bladder CA s/p TURBT, remote DVT (2000), stroke, CAD s/p 1v CABG and bioprosthetic AVR and MVR 2009. Patient presents presented to [facility] for abnomal labs in T. Bili and LFTs found to be concerns for new soft tissue mass. Pharmacy consulted for heparin  for atrial fibrillation.  8/24 evening aPTT therapeutic on 950 units/hr. No overt bleeding reported.Patient to have ERCP tomorrow and GI has asked to stop heparin  at 0700.  Goal of Therapy:  Heparin  level 0.3-0.7 units/ml aPTT 66-102 seconds Monitor platelets by anticoagulation protocol: Yes   Plan:  Continue heparin  rate to 950 units/hr Check aPTT and anti-Xa level in 8 hours and daily while on heparin  Daily CBC Continue to monitor H&H and platelets  Koren Or, PharmD Clinical Pharmacist 01/12/2024 7:52 PM Please check AMION for all [facility] Pharmacy numbers   Please check AMION for all [facility] Pharmacy numbers

## 2024-01-12 NOTE — Progress Notes (Signed)
 PHARMACY - ANTICOAGULATION CONSULT NOTE  Pharmacy Consult for IV heparin  Indication: atrial fibrillation  No Known Allergies  Patient Measurements: Height: 6' (182.9 cm) Weight: 78.2 kg (172 lb 4.8 oz) IBW/kg (Calculated) : 77.6 HEPARIN  DW (KG): 81.6  Vital Signs: Temp: 97.4 F (36.3 C) (08/24 0312) Temp Source: Oral (08/24 0312) BP: 119/76 (08/24 0312) Pulse Rate: 67 (08/24 0312)  Labs: Recent Labs    01/10/24 1648 01/10/24 1739 01/11/24 0252 01/11/24 1038 01/11/24 1825 01/12/24 0800  HGB 10.9*  --  11.0*  --   --  10.3*  HCT 33.8*  --  33.2*  --   --  30.7*  PLT 130*  --  104*  --   --  87*  APTT  --  34  --   --  146* 128*  LABPROT  --  19.8* 18.6*  --   --   --   INR  --  1.6* 1.5*  --   --   --   HEPARINUNFRC  --   --   --  >1.10* >1.10* >1.10*  CREATININE 1.23  --  1.06  --   --  1.04    Estimated Creatinine Clearance: 49.7 mL/min (by C-G formula based on SCr of 1.04 mg/dL).   Medical History: Past Medical History:  Diagnosis Date   Atrial flutter, paroxysmal (HCC)    a. dx 04-02-2014, s/p  successful cardioversion 04-06-2014.   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 05/2007 CABG x 1: s/p SVG-OM;  b. 10/2010 Ex MV: EF 72%, inf attenuation w/o ischemia, brief run of PAT with exercise. To   Stroke (HCC)    Thrombocytopenia (HCC)    Thyroid  goiter 2013   nodular   Wears hearing aid    BILATERAL   Wears partial dentures    UPPER    Medications:  Medications Prior to Admission  Medication Sig Dispense Refill Last Dose/Taking   amoxicillin  (AMOXIL ) 500 MG tablet Take 4 tablets (2,000 mg total) by mouth as directed. Take 1 hour prior to dental work, including cleanings. 4 tablet 6 Unknown   apixaban  (ELIQUIS ) 5 MG TABS tablet TAKE 1 TABLET BY MOUTH TWICE  DAILY 180 tablet 2 01/10/2024 at  7:00 AM   Ascorbic Acid (VITAMIN C PO) Take 500 mg by mouth in the morning. Gummies 250 mg each   01/10/2024   atorvastatin  (LIPITOR) 20 MG tablet Take 40 mg by mouth every evening.   Past Week   diltiazem  (CARDIZEM ) 30 MG tablet Take  1 tablet (30 mg total) by mouth daily as needed. 90 tablet 0 Unknown   Eyelid Cleansers (OCUSOFT EYELID CLEANSING) PADS Place 1 application into the left eye in the morning.   01/10/2024   ezetimibe  (ZETIA ) 10 MG tablet TAKE 1 TABLET BY MOUTH IN THE  EVENING 90 tablet 3 Past Week   furosemide  (LASIX ) 20 MG tablet Take 1 tablet (20 mg total) by mouth daily. 90 tablet 3 01/10/2024   losartan  (COZAAR ) 25 MG tablet Take 0.5 tablets (12.5 mg total) by mouth at bedtime. 45 tablet 1 Past Week   metoprolol  tartrate (LOPRESSOR ) 25 MG tablet Take 2 tablets (50 mg total) by mouth 2 (two) times daily. 180 tablet 1 01/10/2024   nitroGLYCERIN  (NITROSTAT ) 0.4 MG SL tablet Place 1 tablet (0.4 mg total) under the tongue every 5 (five) minutes as needed. 25 tablet 3 Unknown   spironolactone  (ALDACTONE ) 25 MG tablet Take 0.5 tablets (12.5 mg total) by mouth in the morning. 45 tablet 1 01/10/2024   Scheduled:   furosemide   20 mg Oral Daily   metoprolol  tartrate  50 mg Oral BID   sodium chloride  flush  3 mL Intravenous Q12H    Infusions:   heparin  1,100 Units/hr (01/12/24 0550)   piperacillin -tazobactam (ZOSYN )  IV     PRN: mouth rinse  Assessment: 89 YO M with PMH significant for Atrial Fibrillation on Eliquis  (Last dose 08/22 AM), bladder CA s/p TURBT, remote DVT (2000), stroke, CAD s/p 1v CABG and bioprosthetic AVR and MVR 2009. Patient presents presented to [facility] for abnomal labs in T. Bili and LFTs found to be concerns for new soft tissue mass. Pharmacy consulted for heparin  for atrial fibrillation.  AM aPTT above goal at 128 on 1100 units/hr. Discussed with RN and no overt bleeding reported. Will lower rate.    Goal of Therapy:  Heparin  level 0.3-0.7 units/ml aPTT 66-102 seconds Monitor platelets by anticoagulation protocol: Yes   Plan:  Decrease heparin  rate to 950 units/hr Check aPTT and anti-Xa level in 8 hours and daily while on heparin  Daily CBC Continue to monitor H&H and platelets  R. Samual Satterfield, PharmD PGY-1 Acute Care Pharmacy Resident [facility] 01/12/2024 9:23 AM  Please check AMION for all [facility] Pharm[facility] numbers

## 2024-01-12 NOTE — Progress Notes (Signed)
 PROGRESS NOTE    Eric [name]  FMW:[id number] DOB: Feb 24, 1930 DOA: 01/10/2024 PCP: Rolinda Millman, MD  Chief Complaint  Patient presents with   labs high    Brief Narrative:   Eric [name] is Eric [name] 89 y.o. male with Eric [name] history of persistent AFib, bladder CA s/p TURBT, remote DVT (2000), stroke, CAD s/p 1v CABG and bioprosthetic AVR and MVR 2009, then valve-in-valve TAVR 2020, then transseptal transcatheter valve in ring TMVR with LAMPOON at [facility] 12/19/2023 who presented to the ED today on the advice of his PCP due to abnormal MRCP. He had been recovering well from [facility], presented to routine dermatology visit earlier this week, was noted to be jaundiced, labs confirmed hyperbilirubinemia and subsequent MRCP performed this morning showed concern for an obstructive soft tissue mass. ERCP was recommended and pt told to proceed to ED. [facility] was consulted by EDP, will consult in AM.    He reports mild upper abdominal discomfort that was fleeting but none now. Has darker urine of late. Limited appetite of late but no nausea or vomiting, normal BMs. No unintentional weight loss, fevers, night sweats.    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 Of soft tissue mass along duodenal wall and distal common duct with hypoenhancement in this area on post contrast sequence.  Recommended further eval with ERCP.   GI planning ERCP with Dr. Rosalie tmrw, zosyn   preop, heparin  to be held at 7 am  Transcatheter transeptal mitral valve replacement (12/19/2023) History of Severe MVR s/p MVR/AVR/CABGx1 (2009) Hx TAVR History of Atrial Fibrillation  Chadsvasc at least 5, will plan for bridging anticoagulation with heparin  while we hold eliquis  Cardiology recommending prophylactic abx and low threshold for empiric abx - bridging with IV heparin    HFpEF Continue lasix  and metop for now ARB and spironolactone  currently on hold   CAD Dyslipidemia Statin and zetia  on hold Eliquis  on hold  Thrombocytopenia Will trend, was low early August in care everywhere labs fluctuating Will continue to monitor while he's on anticoagulation     DVT prophylaxis: heparin  gtt Code Status: full Family Communication: wife at bedside Disposition:   Status is: Observation The patient remains OBS appropriate and will d/c before 2 midnights.   Consultants:  GI cardiology  Procedures:  none  Antimicrobials:  Anti-infectives (From admission, onward)    Start     Dose/Rate Route Frequency Ordered Stop   01/12/24 0945  piperacillin -tazobactam (ZOSYN ) IVPB 3.375 g        3.375 g 12.5 mL/hr over 240 Minutes Intravenous Every 8 hours 01/12/24 0848         Subjective: No new complaints Wife at bedside  Objective: Vitals:   01/11/24 1914 01/12/24 0238 01/12/24 0312 01/12/24 1000  BP: 109/78  119/76 105/78  Pulse: 70  67 92  Resp: 18  18 18   Temp: 97.9 F (36.6 C)  (!) 97.4 F (36.3 C) 97.8 F (36.6 C)  TempSrc:  Oral  Oral Oral  SpO2: 98%  98%   Weight:  78.2 kg    Height:        Intake/Output Summary (Last 24 hours) at 01/12/2024 1522 Last data filed at 01/12/2024 1253 Gross per 24 hour  Intake 555.03 ml  Output 600 ml  Net -44.97 ml   Filed Weights   01/10/24 2019 01/11/24 0319 01/12/24 0238  Weight: 81.6 kg 77.6 kg 78.2 kg    Examination:  General exam: jaundiced, calm, comfortable Respiratory system: unlabored Central nervous  system: Eric [name]&O, moving all extremities Extremities:no LEE   Data Reviewed: I have personally reviewed following labs and imaging studies  CBC: Recent Labs  Lab 01/10/24 1648 01/11/24 0252 01/12/24 0800  WBC 4.7 7.6 4.4  NEUTROABS  --   --  2.9  HGB 10.9* 11.0* 10.3*  HCT 33.8* 33.2* 30.7*  MCV 99.7 98.8 98.7  PLT 130* 104* 87*    Basic Metabolic Panel: Recent Labs  Lab 01/10/24 1648 01/11/24 0252 01/12/24 0800  NA 137 139 138  K 3.7 3.5 3.8  CL 108 109 109  CO2 20* 21* 21*  GLUCOSE 136* 92 93  BUN 27* 25* 21  CREATININE 1.23 1.06 1.04  CALCIUM  9.3 9.1 8.9  MG  --   --  1.8  PHOS  --   --  3.1    GFR: Estimated Creatinine Clearance: 49.7 mL/min (by C-G formula based on SCr of 1.04 mg/dL).  Liver Function Tests: Recent Labs  Lab 01/10/24 1648 01/11/24 0252 01/12/24 0800  AST 104* 93* 57*  ALT 89* 72* 49*  ALKPHOS 132* 116 94  BILITOT 8.1* 8.6* 7.1*  PROT 6.9 5.1* 6.1*  ALBUMIN 3.8 3.5 3.3*    CBG: No results for input(s): GLUCAP in the last 168 hours.   No results found for this or any previous visit (from the past 240 hours).       Radiology Studies: No results found.       Scheduled Meds:  metoprolol  tartrate  50 mg Oral BID   sodium chloride  flush  3 mL Intravenous Q12H   Continuous Infusions:  heparin  950 Units/hr (01/12/24 1100)   piperacillin -tazobactam (ZOSYN )  IV 3.375 g (01/12/24 1505)     LOS: 1 day    Time spent: over 30 min    Meliton Monte, MD [facility]   To contact the attending provider between 7A-7P or the covering provider during after hours 7P-7A, please log into the web site [email or web address] and access using universal [place] password for that web site. If you do not have the password, please call the hospital operator.  01/12/2024, 3:22 PM

## 2024-01-12 NOTE — Plan of Care (Signed)

## 2024-01-12 NOTE — Progress Notes (Signed)
  Progress Note  Patient Name: Eric [name] Date of Encounter: 01/12/2024 [facility] Cardiologist: Gordy Bergamo, MD   Interval Summary   No acute overnight events. Patient reports feeling relatively well. No new or acute complaints.   Vital Signs Vitals:   01/11/24 1611 01/11/24 1914 01/12/24 0238 01/12/24 0312  BP: 130/89 109/78  119/76  Pulse: 97 70  67  Resp:  18  18  Temp: 97.9 F (36.6 C) 97.9 F (36.6 C)  (!) 97.4 F (36.3 C)  TempSrc: Oral Oral  Oral  SpO2: 98% 98%  98%  Weight:   78.2 kg   Height:        Intake/Output Summary (Last 24 hours) at 01/12/2024 0819 Last data filed at 01/12/2024 0400 Gross per 24 hour  Intake 187.53 ml  Output 500 ml  Net -312.47 ml      01/12/2024    2:38 AM 01/11/2024    3:19 AM 01/10/2024    8:19 PM  Last 3 Weights  Weight (lbs) 172 lb 4.8 oz 171 lb 1.2 oz 179 lb 14.4 oz  Weight (kg) 78.155 kg 77.6 kg 81.602 kg      Telemetry/ECG  AF with PVCs - Personally Reviewed  Physical Exam  General: Well developed, in no acute distress.  Neck: No JVD.  Cardiac: Normal rate, irregular rhythm.  Resp: Normal work of breathing.  Ext: No edema.  Neuro: No gross focal deficits.  Psych: Normal affect.   Assessment & Plan  Patient is admitted with jaundice.  Found to have obstructive soft tissue mass on MRCP.  Admitted for planned ERCP.  Cardiology consulted due to complex cardiac history.     Patient has been doing well since his TMVR on 12/19/2023.  He appears well compensated on exam.  He denies any chest pain.  There are no active cardiac concerns.   Problem list: Severe bioprosthetic mitral regurgitation status post TMVR on 12/19/2023 Severe aortic stenosis status post AVR in 2009 then TAVR in 2020 CAD status post CABG Persistent atrial fibrillation PVCs   Plan:  - Recommend holding Eliquis  and bridging with IV heparin  in anticipation of procedural intervention. Can interrupt as needed for procedure and resume when safe  from surgical perspective.  - Low threshold to start empiric antibiotics if there is any suspicion for GI related infection.  At minimum, he will need prophylactic antibiotics for any procedure/intervention given fresh bioprosthetic valve implantation. - He appears well compensated on exam, so would continue current home regimen of cardiac meds.  I see no need for IV diuretics at this time.  Can hold oral Lasix  on day of ERCP, as I suspect he will be n.p.o. for a period of time. - Regarding his AF, cannot pursue rhythm control at this time due to anticipated procedures and need for interruption of anti-coagulation, but recommend following up in clinic in several weeks and if no further interventions are expected pursue DCCV.   For questions or updates, please contact [facility] Please consult [email or web address] for contact info under       Signed, Fonda Kitty, MD

## 2024-01-12 NOTE — Progress Notes (Signed)
 [facility] Gastroenterology Progress Note  MANA MORISON 89 y.o. 03/04/1930  CC: Jaundice   Subjective: Patient seen and examined bedside.  No acute issues overnight.  ROS : Afebrile, negative for chest pain   Objective: Vital signs in last 24 hours: Vitals:   01/11/24 1914 01/12/24 0312  BP: 109/78 119/76  Pulse: 70 67  Resp: 18 18  Temp: 97.9 F (36.6 C) (!) 97.4 F (36.3 C)  SpO2: 98% 98%    Physical Exam:  General:   Alert,  Well-developed, well-nourished, pleasant and cooperative in NAD Normocephalic.  Atraumatic Scleral icterus noted Lungs: No visible respiratory distress Heart:  Regular rate and rhythm; no murmurs, clicks, rubs,  or gallops. Abdomen: Soft, nontender, nondistended, bowel sound present, no peritoneal signs Mood and affect normal Alert and oriented x 3   Lab Results: Recent Labs    01/11/24 0252 01/12/24 0800  NA 139 138  K 3.5 3.8  CL 109 109  CO2 21* 21*  GLUCOSE 92 93  BUN 25* 21  CREATININE 1.06 1.04  CALCIUM  9.1 8.9  MG  --  1.8  PHOS  --  3.1   Recent Labs    01/11/24 0252 01/12/24 0800  AST 93* 57*  ALT 72* 49*  ALKPHOS 116 94  BILITOT 8.6* 7.1*  PROT 5.1* 6.1*  ALBUMIN 3.5 3.3*   Recent Labs    01/11/24 0252 01/12/24 0800  WBC 7.6 4.4  NEUTROABS  --  2.9  HGB 11.0* 10.3*  HCT 33.2* 30.7*  MCV 98.8 98.7  PLT 104* 87*   Recent Labs    01/10/24 1739 01/11/24 0252  LABPROT 19.8* 18.6*  INR 1.6* 1.5*      Assessment/Plan: -Obstructive jaundice with MRI concerning for soft tissue mass along the duodenal wall and distal common bile duct.  - History of atypical atrial flutter.  Last dose of Eliquis  yesterday morning. - History of bioprosthetic mitral valve repair on December 19, 2023   Recommendations ----------------------- - Plan for ERCP with Dr. Rosalie tomorrow afternoon.  Keep n.p.o. past midnight. -Zosyn  preop -Hold heparin  drip from 7 AM in the morning.  Risks (bleeding, infection, bowel perforation  that could require surgery, sedation-related changes in cardiopulmonary systems), benefits (identification and possible treatment of source of symptoms, exclusion of certain causes of symptoms), and alternatives (watchful waiting, radiographic imaging studies, empiric medical treatment)  were explained to patient/family in detail and patient wishes to proceed.   Layla Lah MD, FACP 01/12/2024, 8:45 AM  Contact #  [phone]

## 2024-01-13 ENCOUNTER — Inpatient Hospital Stay (HOSPITAL_COMMUNITY)

## 2024-01-13 ENCOUNTER — Inpatient Hospital Stay (HOSPITAL_COMMUNITY): Admitting: Anesthesiology

## 2024-01-13 ENCOUNTER — Encounter (HOSPITAL_COMMUNITY)
Admission: EM | Disposition: A | Discharge status: Home / Self Care | Payer: Self-pay | Source: Home / Self Care | Attending: Family Medicine

## 2024-01-13 ENCOUNTER — Encounter (HOSPITAL_COMMUNITY): Payer: Self-pay | Admitting: Family Medicine

## 2024-01-13 DIAGNOSIS — R932 Abnormal findings on diagnostic imaging of liver and biliary tract: Secondary | ICD-10-CM | POA: Diagnosis not present

## 2024-01-13 DIAGNOSIS — I4819 Other persistent atrial fibrillation: Secondary | ICD-10-CM | POA: Diagnosis not present

## 2024-01-13 DIAGNOSIS — K831 Obstruction of bile duct: Secondary | ICD-10-CM | POA: Diagnosis not present

## 2024-01-13 DIAGNOSIS — I10 Essential (primary) hypertension: Secondary | ICD-10-CM

## 2024-01-13 DIAGNOSIS — I2510 Atherosclerotic heart disease of native coronary artery without angina pectoris: Secondary | ICD-10-CM | POA: Diagnosis not present

## 2024-01-13 DIAGNOSIS — R17 Unspecified jaundice: Secondary | ICD-10-CM

## 2024-01-13 HISTORY — PX: ERCP: SHX5425

## 2024-01-13 LAB — COMPREHENSIVE METABOLIC PANEL WITH GFR
ALT: 40 U/L (ref 0–44)
AST: 50 U/L — ABNORMAL HIGH (ref 15–41)
Albumin: 3.2 g/dL — ABNORMAL LOW (ref 3.5–5.0)
Alkaline Phosphatase: 85 U/L (ref 38–126)
Anion gap: 8 (ref 5–15)
BUN: 20 mg/dL (ref 8–23)
CO2: 22 mmol/L (ref 22–32)
Calcium: 8.9 mg/dL (ref 8.9–10.3)
Chloride: 109 mmol/L (ref 98–111)
Creatinine, Ser: 1.16 mg/dL (ref 0.61–1.24)
GFR, Estimated: 59 mL/min — ABNORMAL LOW (ref >60)
Glucose, Bld: 84 mg/dL (ref 70–99)
Potassium: 3.4 mmol/L — ABNORMAL LOW (ref 3.5–5.1)
Sodium: 139 mmol/L (ref 135–145)
Total Bilirubin: 6.8 mg/dL — ABNORMAL HIGH (ref 0.0–1.2)
Total Protein: 6.1 g/dL — ABNORMAL LOW (ref 6.5–8.1)

## 2024-01-13 LAB — CBC
HCT: 30.3 % — ABNORMAL LOW (ref 39.0–52.0)
Hemoglobin: 10 g/dL — ABNORMAL LOW (ref 13.0–17.0)
MCH: 32.8 pg (ref 26.0–34.0)
MCHC: 33 g/dL (ref 30.0–36.0)
MCV: 99.3 fL (ref 80.0–100.0)
Platelets: 90 K/uL — ABNORMAL LOW (ref 150–400)
RBC: 3.05 MIL/uL — ABNORMAL LOW (ref 4.22–5.81)
RDW: 14.6 % (ref 11.5–15.5)
WBC: 4.4 K/uL (ref 4.0–10.5)
nRBC: 0 % (ref 0.0–0.2)

## 2024-01-13 LAB — PHOSPHORUS: Phosphorus: 3.7 mg/dL (ref 2.5–4.6)

## 2024-01-13 LAB — MAGNESIUM: Magnesium: 1.8 mg/dL (ref 1.7–2.4)

## 2024-01-13 LAB — APTT: aPTT: 88 s — ABNORMAL HIGH (ref 24–36)

## 2024-01-13 LAB — HEPARIN LEVEL (UNFRACTIONATED): Heparin Unfractionated: 0.68 [IU]/mL (ref 0.30–0.70)

## 2024-01-13 SURGERY — ERCP, WITH INTERVENTION IF INDICATED
Anesthesia: General

## 2024-01-13 MED ORDER — GLUCAGON HCL RDNA (DIAGNOSTIC) 1 MG IJ SOLR
INTRAMUSCULAR | Status: AC
  Filled 2024-01-13: qty 1

## 2024-01-13 MED ORDER — ROCURONIUM BROMIDE 10 MG/ML (PF) SYRINGE
PREFILLED_SYRINGE | INTRAVENOUS | Status: DC | PRN
  Administered 2024-01-13: 50 mg via INTRAVENOUS

## 2024-01-13 MED ORDER — POTASSIUM CHLORIDE 10 MEQ/100ML IV SOLN
10.0000 meq | INTRAVENOUS | Status: AC
  Administered 2024-01-13 (×3): 10 meq via INTRAVENOUS
  Filled 2024-01-13 (×3): qty 100

## 2024-01-13 MED ORDER — SODIUM CHLORIDE 0.9 % IV SOLN: INTRAVENOUS | Status: AC | PRN

## 2024-01-13 MED ORDER — PROPOFOL 10 MG/ML IV BOLUS
INTRAVENOUS | Status: DC | PRN
  Administered 2024-01-13: 90 mg via INTRAVENOUS

## 2024-01-13 MED ORDER — SODIUM CHLORIDE 0.9 % IV SOLN: INTRAVENOUS | Status: DC

## 2024-01-13 MED ORDER — LIDOCAINE 2% (20 MG/ML) 5 ML SYRINGE
INTRAMUSCULAR | Status: DC | PRN
  Administered 2024-01-13: 60 mg via INTRAVENOUS

## 2024-01-13 MED ORDER — FENTANYL CITRATE (PF) 250 MCG/5ML IJ SOLN
INTRAMUSCULAR | Status: DC | PRN
  Administered 2024-01-13 (×2): 25 ug via INTRAVENOUS

## 2024-01-13 MED ORDER — SUGAMMADEX SODIUM 200 MG/2ML IV SOLN
INTRAVENOUS | Status: DC | PRN
  Administered 2024-01-13: 312.4 mg via INTRAVENOUS

## 2024-01-13 MED ORDER — SODIUM CHLORIDE 0.9 % IV SOLN
INTRAVENOUS | Status: DC | PRN
  Administered 2024-01-13: 30 mL

## 2024-01-13 MED ORDER — FENTANYL CITRATE (PF) 100 MCG/2ML IJ SOLN
INTRAMUSCULAR | Status: AC
  Filled 2024-01-13: qty 2

## 2024-01-13 MED ORDER — ONDANSETRON HCL 4 MG/2ML IJ SOLN
INTRAMUSCULAR | Status: DC | PRN
  Administered 2024-01-13: 4 mg via INTRAVENOUS

## 2024-01-13 MED ORDER — PHENYLEPHRINE 80 MCG/ML (10ML) SYRINGE FOR IV PUSH (FOR BLOOD PRESSURE SUPPORT)
PREFILLED_SYRINGE | INTRAVENOUS | Status: DC | PRN
  Administered 2024-01-13 (×6): 80 ug via INTRAVENOUS

## 2024-01-13 MED ORDER — DICLOFENAC SUPPOSITORY 100 MG
RECTAL | Status: AC
  Filled 2024-01-13: qty 1

## 2024-01-13 MED ORDER — DEXAMETHASONE SODIUM PHOSPHATE 10 MG/ML IJ SOLN
INTRAMUSCULAR | Status: DC | PRN
  Administered 2024-01-13: 5 mg via INTRAVENOUS

## 2024-01-13 MED ORDER — DICLOFENAC SUPPOSITORY 100 MG
RECTAL | Status: DC | PRN
  Administered 2024-01-13: 100 mg via RECTAL

## 2024-01-13 NOTE — Progress Notes (Addendum)
 Progress Note  Patient Name: Eric [name] Date of Encounter: 01/13/2024 [facility] Cardiologist: Gordy Bergamo, MD   Interval Summary    89 year old male with PMH of persistent A-fib on Eliquis , CAD s/p single-vessel CABG with SVG to OM, severe aortic stenosis and mitral regurgitation s/p bioprosthetic AVR using a 23 mm Mitroflow pericardial valve and mitral valve repair with a 28 mm Sorin 3D Memo annuloplasty ring in 2009, s/p valve in valve TAVR in 2020, severe bioprosthetic mitral regurgitation with PVL due to lateral mitral ring dehiscence  s/p transseptal TMVR valve in ring with LAMPOON 12/19/23 at [facility], recurrent DVT/PE, HFpEF, bladder cancer, CVA,  who presented to the hospital on 01/10/2024 for jaundice with lab work showing transaminitis and hyperbilirubinemia.  MRI revealed marked dilatation of common bile duct with mild prominence of central intrahepatic biliary tree, no choledocholithiasis, no acute cholecystitis, question of a soft tissue mass along the duodenal wall and distal common duct with hypoenhancement in this area.  He is planned for ERCP today.  Cardiology has been consulted and following due to complex cardiac history.   Patient states he feels well, he felt much better after [facility] 11/2023. He denied chest pain or SOB. He is concerned about his jaundice. He has no abdominal pain.     Vital Signs Vitals:   01/12/24 2315 01/13/24 0515 01/13/24 0738 01/13/24 0921  BP: 111/70 102/64 124/76 116/74  Pulse: 60 64 65 75  Resp: 18 19    Temp: 97.6 F (36.4 C) 98 F (36.7 C) 98 F (36.7 C) 98.4 F (36.9 C)  TempSrc: Oral Oral Oral Oral  SpO2: 100% 98% 98% 98%  Weight:  78.1 kg    Height:        Intake/Output Summary (Last 24 hours) at 01/13/2024 1124 Last data filed at 01/13/2024 0825 Gross per 24 hour  Intake 324.59 ml  Output 660 ml  Net -335.41 ml      01/13/2024    5:15 AM 01/12/2024    2:38 AM 01/11/2024    3:19 AM  Last 3 Weights   Weight (lbs) 172 lb 2.9 oz 172 lb 4.8 oz 171 lb 1.2 oz  Weight (kg) 78.1 kg 78.155 kg 77.6 kg      Telemetry/ECG  A fib with VR 60s, PVCs - Personally Reviewed  Physical Exam  GEN: No acute distress.   Neck: No JVD Cardiac: Irregularly irregular, no murmur  Respiratory: Clear to auscultation bilaterally. On room air  GI: Soft, nontender, non-distended  MS: Trace BLE edema  Assessment & Plan   CAD s/p CABG with SVG to OM 2009 Aortic stenosis s/p bioprosthetic AVR with a 23 mm Mitroflow valve 2009 and valve in valve TAVR in 2020 with a 23 mm Edwards S3 valve  Mitral regurgitation s/p mitral valve repair with a 28 mm Sorin MenoIII-V mitral annuloplasty ring in 2009 with subsequent severe bioprosthetic mitral regurgitation s/p transseptal transcatheter valve in ring TMVR (26mm Sapien valve) with LAMPOON 12/19/23 Chronic HFpEF - Pos-op Echo 12/20/23 at [facility] clinic with [id number]%, S/p TMVR with 26mm Sapien 3 valve with a mean diastolic gradient of at a heart rate of 68bpm. Mild paravalvular regurgitation. S/p TAVR with peak/mean gradients of 17 and , respectively. Trivial PVL.  - Followed up with structural heart team 12/30/2023, doing well without cardiac symptoms, euvolemic on exam, he was recommended SBE prophylaxis,  Eliquis  5 mg twice daily, 1 month echo for follow-up - Eliquis  currently held due to ERCP  and potential surgery, he is being bridged with IV heparin  due to recurrent PE/DVT in the past + CHADS2-VASc of 7 for A fib  - clinically euvolemic on exam, continue PTA GDMT with lipitor 20mg , zetia  10mg , losartan  25mg , spironolactone  12.5mg , metoprolol  50mg  BID; PTA Lasix  held today due to planned procedure  - if no significant finding on ERCP, would repeat inpatient Echo, otherwise we will follow PRN, see MD addendum for detail    Persistent atrial fibrillation - if no further issue at follow-up, pursue DCCV per Dr. Kennyth recommendation - resume Eliquis  when  appropriate - continue PTA metoprolol  50mg  BID and PRN diltiazem    HTN - BP controlled at this time     For questions or updates, please contact [facility] Please consult [email or web address] for contact info under       Signed, Xika Zhao, NP   ATTENDING ATTESTATION:  After conducting a review of all available clinical information with the care team, interviewing the patient, and performing a physical exam, I agree with the findings and plan described in this note.   GEN: No acute distress.  Jaundiced HEENT:  MMM, no JVD, no scleral icterus Cardiac: RRR, no murmurs, rubs, or gallops.  Respiratory: Clear to auscultation bilaterally. GI: Soft, nontender, non-distended  MS: No edema; No deformity. Neuro:  Nonfocal  Vasc:  +2 radial pulses  Patient doing well today without cardiovascular complaints.  He denies any shortness of breath, palpitations, paroxysmal nocturnal dyspnea, orthopnea.  Unfortunately he has developed painless jaundice and an MRCP demonstrated an associated mass with common bile duct dilatation.  He is at low risk for periprocedural cardiovascular complications during ERCP as this is a low risk procedure.  No further cardiac evaluation is needed.  Will sign off.  Call with questions.  Lurena Red, MD Pager [phone]

## 2024-01-13 NOTE — Plan of Care (Signed)

## 2024-01-13 NOTE — Plan of Care (Signed)
   Problem: Education: Goal: Knowledge of General Education information will improve Description: Including pain rating scale, medication(s)/side effects and non-pharmacologic comfort measures Outcome: Progressing   Problem: Clinical Measurements: Goal: Ability to maintain clinical measurements within normal limits will improve Outcome: Progressing   Problem: Clinical Measurements: Goal: Will remain free from infection Outcome: Progressing   Problem: Clinical Measurements: Goal: Diagnostic test results will improve Outcome: Progressing   Problem: Activity: Goal: Risk for activity intolerance will decrease Outcome: Progressing

## 2024-01-13 NOTE — Anesthesia Postprocedure Evaluation (Signed)
 Anesthesia Post Note  Patient: Eric [name]  Procedure(s) Performed: ERCP, WITH INTERVENTION IF INDICATED     Patient location during evaluation: Endoscopy Anesthesia Type: General Level of consciousness: awake and alert Pain management: pain level controlled Vital Signs Assessment: post-procedure vital signs reviewed and stable Respiratory status: spontaneous breathing, nonlabored ventilation, respiratory function stable and patient connected to nasal cannula oxygen Cardiovascular status: blood pressure returned to baseline and stable Postop Assessment: no apparent nausea or vomiting Anesthetic complications: no   No notable events documented.  Last Vitals:  Vitals:   01/13/24 1541 01/13/24 1631  BP: (!) 142/93 122/85  Pulse: 69 65  Resp: 13 18  Temp:  36.8 C  SpO2: 96% 96%    Last Pain:  Vitals:   01/13/24 1631  TempSrc: Oral  PainSc:    Pain Goal: Patients Stated Pain Goal: 0 (01/13/24 1235)                 [name]

## 2024-01-13 NOTE — Transfer of Care (Signed)
 Immediate Anesthesia Transfer of Care Note  Patient: Eric [name]  Procedure(s) Performed: ERCP, WITH INTERVENTION IF INDICATED  Patient Location: Endoscopy Unit  Anesthesia Type:General  Level of Consciousness: drowsy, patient cooperative, and responds to stimulation  Airway & Oxygen Therapy: Patient Spontanous Breathing and Patient connected to nasal cannula oxygen  Post-op Assessment: Report given to RN and Post -op Vital signs reviewed and stable  Post vital signs: Reviewed and stable  Last Vitals:  Vitals Value Taken Time  BP 160/98 01/13/24 15:22  Temp 36 C 01/13/24 15:22  Pulse 75 01/13/24 15:22  Resp 19 01/13/24 15:22  SpO2 98 % 01/13/24 15:22    Last Pain:  Vitals:   01/13/24 1522  TempSrc: Temporal  PainSc: Asleep      Patients Stated Pain Goal: 0 (01/13/24 1235)  Complications: No notable events documented.

## 2024-01-13 NOTE — Progress Notes (Signed)
 PROGRESS NOTE    Eric [name]  FMW:[id number] DOB: 05-Nov-1929 DOA: 01/10/2024 PCP: Rolinda Millman, MD  Chief Complaint  Patient presents with   labs high    Brief Narrative:   Eric [name] is Eric [name] 89 y.o. male with Hayden Mabin history of persistent AFib, bladder CA s/p TURBT, remote DVT (2000), stroke, CAD s/p 1v CABG and bioprosthetic AVR and MVR 2009, then valve-in-valve TAVR 2020, then transseptal transcatheter valve in ring TMVR with [facility] at [facility] 12/19/2023 who presented to the ED today on the advice of his PCP due to abnormal MRCP. He had been recovering well from [facility], presented to routine dermatology visit earlier this week, was noted to be jaundiced, labs confirmed hyperbilirubinemia and subsequent MRCP performed this morning showed concern for an obstructive soft tissue mass. ERCP was recommended and pt told to proceed to ED. [facility] was consulted by EDP, will consult in AM.    He reports mild upper abdominal discomfort that was fleeting but none now. Has darker urine of late. Limited appetite of late but no nausea or vomiting, normal BMs. No unintentional weight loss, fevers, night sweats.    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 Of soft tissue mass along duodenal wall and distal common duct with hypoenhancement in this area on post contrast sequence.  Recommended further eval with ERCP.   GI planning ERCP with Dr. Rosalie today  (8/25), zosyn  preop, heparin  on hold 7 am  Transcatheter transeptal mitral valve replacement (12/19/2023) History of Severe MVR s/p MVR/AVR/CABGx1 (2009) Hx TAVR History of Atrial Fibrillation  Chadsvasc at least 5, will plan for bridging anticoagulation with heparin  while we hold eliquis  Cardiology recommending prophylactic abx and low threshold for empiric abx - bridging with IV heparin  (currently on hold)  HFpEF Continue lasix  and metop for now ARB and spironolactone  currently on hold   CAD Dyslipidemia Statin and zetia  on hold Eliquis  on hold  Thrombocytopenia Will trend, was low early August in care everywhere labs fluctuating Will continue to monitor while he's on anticoagulation     DVT prophylaxis: heparin  gtt Code Status: full Family Communication: wife at bedside Disposition:   Status is: Observation The patient remains OBS appropriate and will d/c before 2 midnights.   Consultants:  GI cardiology  Procedures:  none  Antimicrobials:  Anti-infectives (From admission, onward)    Start     Dose/Rate Route Frequency Ordered Stop   01/12/24 0945  piperacillin -tazobactam (ZOSYN ) IVPB 3.375 g        3.375 g 12.5 mL/hr over 240 Minutes Intravenous Every 8 hours 01/12/24 0848         Subjective: No new complaints  Objective: Vitals:   01/12/24 2315 01/13/24 0515 01/13/24 0738 01/13/24 0921  BP: 111/70 102/64 124/76 116/74  Pulse: 60 64 65 75  Resp: 18 19    Temp: 97.6 F (36.4 C) 98 F (36.7 C) 98 F (36.7 C)   TempSrc: Oral Oral Oral  SpO2: 100% 98% 98%   Weight:  78.1 kg    Height:        Intake/Output Summary (Last 24 hours) at 01/13/2024 0925 Last data filed at 01/13/2024 0825 Gross per 24 hour  Intake 364.47 ml  Output 660 ml  Net -295.53 ml   Filed Weights   01/11/24 0319 01/12/24 0238 01/13/24 0515  Weight: 77.6 kg 78.2 kg 78.1 kg    Examination:  General: No acute distress. Jaundiced. Cardiovascular: irregularly  irregular Lungs: Clear to auscultation bilaterally  Abdomen: Soft, nontender, nondistended  Neurological: Alert and oriented 3. Moves all extremities 4 with equal strength. Cranial nerves II through XII grossly intact. Skin: Warm and dry. No rashes or lesions. Extremities: No clubbing or cyanosis. No edema.    Data Reviewed: I have personally reviewed following labs and imaging studies  CBC: Recent Labs  Lab 01/10/24 1648 01/11/24 0252 01/12/24 0800 01/13/24 0241  WBC 4.7 7.6 4.4 4.4  NEUTROABS  --   --  2.9  --   HGB 10.9* 11.0* 10.3* 10.0*  HCT 33.8* 33.2* 30.7* 30.3*  MCV 99.7 98.8 98.7 99.3  PLT 130* 104* 87* 90*    Basic Metabolic Panel: Recent Labs  Lab 01/10/24 1648 01/11/24 0252 01/12/24 0800 01/13/24 0241  NA 137 139 138 139  K 3.7 3.5 3.8 3.4*  CL 108 109 109 109  CO2 20* 21* 21* 22  GLUCOSE 136* 92 93 84  BUN 27* 25* 21 20  CREATININE 1.23 1.06 1.04 1.16  CALCIUM  9.3 9.1 8.9 8.9  MG  --   --  1.8 1.8  PHOS  --   --  3.1 3.7    GFR: Estimated Creatinine Clearance: 44.6 mL/min (by C-G formula based on SCr of 1.16 mg/dL).  Liver Function Tests: Recent Labs  Lab 01/10/24 1648 01/11/24 0252 01/12/24 0800 01/13/24 0241  AST 104* 93* 57* 50*  ALT 89* 72* 49* 40  ALKPHOS 132* 116 94 85  BILITOT 8.1* 8.6* 7.1* 6.8*  PROT 6.9 5.1* 6.1* 6.1*  ALBUMIN 3.8 3.5 3.3* 3.2*    CBG: No results for input(s): GLUCAP in the last 168 hours.   No results found for this or any previous visit (from the past 240 hours).       Radiology Studies: No results found.       Scheduled Meds:  metoprolol  tartrate  50 mg Oral BID   sodium chloride  flush  3 mL Intravenous Q12H   Continuous Infusions:  sodium chloride      piperacillin -tazobactam (ZOSYN )  IV 3.375 g (01/13/24 0557)   potassium chloride  10 mEq (01/13/24 0920)     LOS: 2 days    Time spent: over 30 min    Meliton Monte, MD [facility]   To contact the attending  provider between 7A-7P or the covering provider during after hours 7P-7A, please log into the web site [email or web address] and access using universal [place] password for that web site. If you do not have the password, please call the hospital operator.  01/13/2024, 9:25 AM

## 2024-01-13 NOTE — Significant Event (Signed)
 Patient transfer to Endo with potassium running. No complications or distress.

## 2024-01-13 NOTE — Progress Notes (Signed)
 Eric [name] [name] 1:48 PM  Subjective: Patient seen and examined and case discussed with his family and my partner Dr. Elicia and his hospital computer chart reviewed and he has no pain and no fever and we discussed his recent cardiology procedure as well Objective: Vital signs stable afebrile no acute distress exam please see preassessment evaluation abdomen is soft nontender labs and MRI reviewed  Assessment: Obstructive jaundice concerns over mass  Plan: The risk benefits methods and success rate of ERCP and stenting and possible tissue sampling was thoroughly discussed with the patient and his family and will proceed later today with anesthesia  Eric [name] E  office [phone] After 5PM or if no answer call [phone]

## 2024-01-13 NOTE — Op Note (Signed)
 [facility] Patient Name: Eric [name] Procedure Date : 01/13/2024 MRN: [id number] Attending MD: Oliva Boots , MD, [id number] Date of Birth: [birth year] CSN: [id number] Age: 89 Admit Type: Inpatient Procedure:                ERCP Indications:              Abnormal MRCP, Jaundice Providers:                Oliva Boots, MD, Collene Edu, RN, Coye Bade,                            Technician Referring MD:              Medicines:                General Anesthesia Complications:            No immediate complications. Estimated Blood Loss:     Estimated blood loss: none. Procedure:                Pre-Anesthesia Assessment:                           - Prior to the procedure, a History and Physical                            was performed, and patient medications and                            allergies were reviewed. The patient's tolerance of                            previous anesthesia was also reviewed. The risks                            and benefits of the procedure and the sedation                            options and risks were discussed with the patient.                            All questions were answered, and informed consent                            was obtained. Prior Anticoagulants: The patient has                            taken heparin , last dose was day of procedure. ASA                            Grade Assessment: III - A patient with severe                            systemic disease. After reviewing the risks and  benefits, the patient was deemed in satisfactory                            condition to undergo the procedure.                           After obtaining informed consent, the scope was                            passed under direct vision. Throughout the                            procedure, the patient's blood pressure, pulse, and                            oxygen saturations were monitored continuously. The                             [id number]-Q190L ([id number]) Olympus duodenoscope was                            introduced through the mouth, and used to inject                            contrast into and used to cannulate the bile duct.                            The ERCP was accomplished without difficulty. The                            patient tolerated the procedure well. Scope In: Scope Out: Findings:      The major papilla was normal. No obvious bulb or second portion the       duodenum mass was seen and deep selective cannulation was readily       obtained and an obvious short distal stricture was confirmed and we       proceeded with a biliary sphincterotomy was made with a Hydratome       sphincterotome using ERBE electrocautery. There was no       post-sphincterotomy bleeding. There was sluggish biliary drainage and we       could get the three quarters bowed sphincterotome easily in and out of       the duct and to discover objects, the biliary tree was swept with an       adjustable 12- 15 mm balloon starting at the bifurcation. Nothing was       found on multiple balloon pull-through's. The 15 mm balloon passed       readily through the entire CBD but had to be lowered to 12 mm to pass       through the patent sphincterotomy site but there was still very sluggish       drainage and we proceeded with obtaining cells for cytology which were       obtained by brushing in the lower third of the main bile duct x 2. We       then placed one 10 Fr by 5  cm temporary plastic biliary stent with a       single external flap and a single internal flap was placed 4.5 cm into       the common bile duct. Bile flowed through the stent. The stent was in       good position. There was no pancreatic duct injections or wire       advancement throughout the procedure the wire and introducer were       removed and the scope was removed and the patient tolerated the       procedure well Impression:                - The major papilla appeared normal. No obvious                            duodenal bulb or second portion of the duodenal                            mass was seen                           - A biliary sphincterotomy was performed.                           - The biliary tree was swept and nothing was found.                           - Cells for cytology obtained in the lower third of                            the main duct x 2.                           - One temporary plastic biliary stent was placed                            into the common bile duct. Recommendation:           - Clear liquid diet today. May have soft solids                            tomorrow if doing well                           - Continue present medications.                           - Resume heparin  at prior dose tomorrow. Unless                            absolutely needed to be started sooner per                            cardiology                           - Return to GI clinic  at appointment to be                            scheduled.                           - Telephone GI clinic for pathology results in 1                            week.                           - Check liver enzymes (AST, ALT, alkaline                            phosphatase, bilirubin) in the morning. And will                            follow back to normal as an outpatient                           - Await cytology results. Procedure Code(s):        --- Professional ---                           [phone], Endoscopic retrograde                            cholangiopancreatography (ERCP); with placement of                            endoscopic stent into biliary or pancreatic duct,                            including pre- and post-dilation and guide wire                            passage, when performed, including sphincterotomy,                            when performed, each stent Diagnosis Code(s):        --- Professional  ---                           R17, Unspecified jaundice                           R93.2, Abnormal findings on diagnostic imaging of                            liver and biliary tract CPT copyright 2022 [employer]. All rights reserved. The codes documented in this report are preliminary and upon coder review may  be revised to meet current compliance requirements. Oliva Boots, MD 01/13/2024 3:31:03 PM This report has been signed electronically. Number of Addenda: 0

## 2024-01-13 NOTE — Anesthesia Procedure Notes (Addendum)
 Procedure Name: Intubation Date/Time: 01/13/2024 2:20 PM  Performed by: Worth Peppers, CRNAPre-anesthesia Checklist: Patient identified, Emergency Drugs available, Suction available and Patient being monitored Patient Re-evaluated:Patient Re-evaluated prior to induction Oxygen Delivery Method: Circle System Utilized Preoxygenation: Pre-oxygenation with 100% oxygen Induction Type: IV induction Ventilation: Mask ventilation without difficulty Laryngoscope Size: 3 and Mac Grade View: Grade I Tube type: Oral Number of attempts: 1 Airway Equipment and Method: Stylet and Oral airway Placement Confirmation: ETT inserted through vocal cords under direct vision, positive ETCO2 and breath sounds checked- equal and bilateral Secured at: 23 cm Tube secured with: Tape Dental Injury: Teeth and Oropharynx as per pre-operative assessment

## 2024-01-13 NOTE — Anesthesia Preprocedure Evaluation (Addendum)
 Anesthesia Evaluation  Patient identified by MRN, date of birth, ID band Patient awake    Reviewed: Allergy & Precautions, NPO status , Patient's Chart, lab work & pertinent test results  History of Anesthesia Complications Negative for: history of anesthetic complications  Airway Mallampati: II  TM Distance: >3 FB Neck ROM: Full    Dental no notable dental hx. (+) Teeth Intact, Dental Advisory Given   Pulmonary neg pulmonary ROS   Pulmonary exam normal breath sounds clear to auscultation       Cardiovascular hypertension, Pt. on medications and Pt. on home beta blockers + CAD, + Past MI (2003) and + CABG (x1 w AVR MVR 2009)  Normal cardiovascular exam+ dysrhythmias Atrial Fibrillation + Valvular Problems/Murmurs (TAVR 2020 mild AI severe MR) AI and MR  Rhythm:Regular Rate:Normal  08/2023 echo  1. Left ventricular ejection fraction, by estimation, is 65 to 70%. The  left ventricle has normal function.   2. Right ventricular systolic function is normal. The right ventricular  size is mildly enlarged.   3. Left atrial size was severely dilated. No left atrial/left atrial  appendage thrombus was detected.   4. Right atrial size was severely dilated.   5. Patient is s/p ring repair. There is severe mitral regurgitation-  mechanism is complex. There is paravalvular leak at the 11 o'clock  position (with mild to moderate regurgitation). There is posterior  restriction creatining the largest jet. There is  evidence of posterior flail creating a smaller jet. There is an overriding  anterior leaflet, worst at A2, with no native LVOT obstruction. Sufficient  height for transeptal approach. Pulmonary vein systolic flow reversal. PVL  VCA 0.35 cm2. Native area  3.22 cm2. The mitral valve has been repaired/replaced. Severe mitral valve  regurgitation. Mild mitral stenosis.   6. Tricuspid valve regurgitation is mild to moderate.   7. The  aortic valve has been repaired/replaced. Aortic valve  regurgitation is mild. No aortic stenosis is present.   8. Normal variant PV anatomy: Right middly pulmonary vein.   9. 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     Neuro/Psych CVA (balance issues), Residual Symptoms  negative psych ROS   GI/Hepatic negative GI ROS,,,Lab Results      Component                Value               Date                      ALT                      40                  01/13/2024                AST                      50 (H)              01/13/2024                ALKPHOS                  85                  01/13/2024  BILITOT                  6.8 (H)             01/13/2024              Endo/Other  negative endocrine ROS    Renal/GU Lab Results      Component                Value               Date                         K                        3.4 (L)             01/13/2024                CO2                      22                  01/13/2024                BUN                      20                  01/13/2024                CREATININE               1.16                01/13/2024                GFRNONAA                 59 (L)              01/13/2024                   ALBUMIN                  3.2 (L)             01/13/2024                GLUCOSE                  84                  01/13/2024            Bladder dysfunction (bladder CA)      Musculoskeletal negative musculoskeletal ROS (+)    Abdominal   Peds  Hematology Lab Results      Component                Value               Date                      WBC                      4.4  01/13/2024                HGB                      10.0 (L)            01/13/2024                HCT                      30.3 (L)            01/13/2024                MCV                      99.3                01/13/2024                PLT                      90 (L)               01/13/2024              Anesthesia Other Findings   Reproductive/Obstetrics                              Anesthesia Physical Anesthesia Plan  ASA: 4  Anesthesia Plan: General   Post-op Pain Management: Minimal or no pain anticipated and Precedex    Induction: Intravenous  PONV Risk Score and Plan: Treatment may vary due to age or medical condition and Ondansetron   Airway Management Planned: Oral ETT  Additional Equipment: None  Intra-op Plan:   Post-operative Plan: Extubation in OR  Informed Consent: I have reviewed the patients History and Physical, chart, labs and discussed the procedure including the risks, benefits and alternatives for the proposed anesthesia with the patient or authorized representative who has indicated his/her understanding and acceptance.     Dental advisory given  Plan Discussed with: CRNA and Surgeon  Anesthesia Plan Comments:          Anesthesia Quick Evaluation

## 2024-01-14 ENCOUNTER — Encounter (HOSPITAL_COMMUNITY): Payer: Self-pay | Admitting: Gastroenterology

## 2024-01-14 DIAGNOSIS — K831 Obstruction of bile duct: Secondary | ICD-10-CM | POA: Diagnosis not present

## 2024-01-14 LAB — CBC
HCT: 31.7 % — ABNORMAL LOW (ref 39.0–52.0)
Hemoglobin: 10.4 g/dL — ABNORMAL LOW (ref 13.0–17.0)
MCH: 33.2 pg (ref 26.0–34.0)
MCHC: 32.8 g/dL (ref 30.0–36.0)
MCV: 101.3 fL — ABNORMAL HIGH (ref 80.0–100.0)
Platelets: 86 K/uL — ABNORMAL LOW (ref 150–400)
RBC: 3.13 MIL/uL — ABNORMAL LOW (ref 4.22–5.81)
RDW: 14.7 % (ref 11.5–15.5)
WBC: 3.5 K/uL — ABNORMAL LOW (ref 4.0–10.5)
nRBC: 0 % (ref 0.0–0.2)

## 2024-01-14 LAB — COMPREHENSIVE METABOLIC PANEL WITH GFR
ALT: 40 U/L (ref 0–44)
AST: 58 U/L — ABNORMAL HIGH (ref 15–41)
Albumin: 3.1 g/dL — ABNORMAL LOW (ref 3.5–5.0)
Alkaline Phosphatase: 81 U/L (ref 38–126)
Anion gap: 8 (ref 5–15)
BUN: 25 mg/dL — ABNORMAL HIGH (ref 8–23)
CO2: 22 mmol/L (ref 22–32)
Calcium: 9.1 mg/dL (ref 8.9–10.3)
Chloride: 107 mmol/L (ref 98–111)
Creatinine, Ser: 1.59 mg/dL — ABNORMAL HIGH (ref 0.61–1.24)
GFR, Estimated: 40 mL/min — ABNORMAL LOW (ref >60)
Glucose, Bld: 138 mg/dL — ABNORMAL HIGH (ref 70–99)
Potassium: 4.5 mmol/L (ref 3.5–5.1)
Sodium: 137 mmol/L (ref 135–145)
Total Bilirubin: 7.6 mg/dL — ABNORMAL HIGH (ref 0.0–1.2)
Total Protein: 4.6 g/dL — ABNORMAL LOW (ref 6.5–8.1)

## 2024-01-14 LAB — HEPARIN LEVEL (UNFRACTIONATED): Heparin Unfractionated: 0.2 [IU]/mL — ABNORMAL LOW (ref 0.30–0.70)

## 2024-01-14 LAB — APTT: aPTT: 30 s (ref 24–36)

## 2024-01-14 MED ORDER — APIXABAN 5 MG PO TABS
5.0000 mg | ORAL_TABLET | Freq: Two times a day (BID) | ORAL | Status: DC
  Administered 2024-01-14 – 2024-01-16 (×5): 5 mg via ORAL
  Filled 2024-01-14 (×5): qty 1

## 2024-01-14 NOTE — Plan of Care (Signed)
  Problem: Education: Goal: Knowledge of General Education information will improve Description: Including pain rating scale, medication(s)/side effects and non-pharmacologic comfort measures Outcome: Progressing   Problem: Clinical Measurements: Goal: Ability to maintain clinical measurements within normal limits will improve Outcome: Progressing   Problem: Clinical Measurements: Goal: Will remain free from infection Outcome: Progressing   Problem: Clinical Measurements: Goal: Diagnostic test results will improve Outcome: Progressing   Problem: Clinical Measurements: Goal: Respiratory complications will improve Outcome: Progressing   Problem: Clinical Measurements: Goal: Cardiovascular complication will be avoided Outcome: Progressing   Problem: Activity: Goal: Risk for activity intolerance will decrease Outcome: Progressing   Problem: Pain Managment: Goal: General experience of comfort will improve and/or be controlled Outcome: Progressing   Problem: Skin Integrity: Goal: Risk for impaired skin integrity will decrease Outcome: Progressing

## 2024-01-14 NOTE — Progress Notes (Signed)
 PROGRESS NOTE    [name]  FMW:[id number] DOB: July 06, 1929 DOA: 01/10/2024 PCP: Rolinda Millman, MD  Chief Complaint  Patient presents with   labs high    Brief Narrative:   Eric [name] is Eric [name] 89 y.o. male with Tannie Koskela history of persistent AFib, bladder CA s/p TURBT, remote DVT (2000), stroke, CAD s/p 1v CABG and bioprosthetic AVR and MVR 2009, then valve-in-valve TAVR 2020, then transseptal transcatheter valve in ring TMVR with LAMPOON at [facility] 12/19/2023 who presented to the ED today on the advice of his PCP due to abnormal MRCP. He had been recovering well from [facility], presented to routine dermatology visit, was noted to be jaundiced, labs confirmed hyperbilirubinemia and subsequent MRCP performed this morning showed concern for an obstructive soft tissue mass.   ERCP was recommended and pt told to proceed to ED.   He's now s/p ERCP with sphincterotomy and temporary plastic biliary stent placed.  Has mild AKI and persistently elevated bilirubin post procedure.  Planning to monitor another day prior to discharge.    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  No obvious duodenal bulb or second portion of duodenal wall mass seen.  Sphincterotomy performed, biliary tree swept and nothing found.  Cells for cytology obtained in lower third of the main duct x2.  Temporary plastic biliary stent placed.  Bilirubin 7.6, AST 58, ALT 40 today.  Discussed with GI, will monitor an additional day S/p MRCP with dilation of the common bile duct  with mild prominence of the central intrahepatic biliary treat - ? Of soft tissue mass along duodenal wall and distal common duct with hypoenhancement in this area on post contrast sequence.  Recommended further eval with ERCP.   Soft diet today.  Follow with GI outpatient.  Trend LFT's outpatient.  Follow pending cytology.  AKI Mild, will monitor - suspect related to NPO status, limited PO intake yesterday (8/25) Continue to hold lasix , ARB, spironolactone   Transcatheter transeptal mitral valve replacement (12/19/2023) History of Severe MVR s/p MVR/AVR/CABGx1 (2009) Hx TAVR History of Atrial Fibrillation  Chadsvasc at least 5, will plan for bridging anticoagulation with heparin  while we hold eliquis  Appreciate cardiology recommendations  HFpEF Continue metop for now Holding lasix   ARB and spironolactone  currently on hold   CAD Dyslipidemia Statin and zetia  on hold Eliquis  on hold  Thrombocytopenia Will trend, was low early August in care everywhere labs fluctuating Will continue to monitor while he's on anticoagulation     DVT prophylaxis: heparin  gtt Code Status: full Family Communication: none at bedside Disposition:   Status is: Observation The patient remains OBS appropriate and will d/c before 2 midnights.   Consultants:  GI cardiology  Procedures:  none  Antimicrobials:  Anti-infectives (From admission, onward)    Start     Dose/Rate Route Frequency Ordered Stop   01/12/24 0945  piperacillin -tazobactam (ZOSYN ) IVPB 3.375 g        3.375 g 12.5 mL/hr over 240 Minutes Intravenous Every 8 hours 01/12/24 0848         Subjective: No  complaints  Objective: Vitals:   01/13/24 2017 01/13/24 2357 01/14/24 0433 01/14/24 0721  BP: 128/88 124/85 (!) 106/57 93/69  Pulse: 87 70 65 87  Resp: 18 18 19 19   Temp: 97.7 F (36.5 C) 97.9 F (36.6 C) (!) 97.4 F (36.3 C) 97.6 F (36.4 C)  TempSrc: Oral Oral Oral Oral  SpO2: 99% 96% 98% 97%  Weight:   78.7 kg    Height:        Intake/Output Summary (Last 24 hours) at 01/14/2024 0932 Last data filed at 01/14/2024 0900 Gross per 24 hour  Intake 521 ml  Output --  Net 521 ml   Filed Weights   01/13/24 0515 01/13/24 1235 01/14/24 0433  Weight: 78.1 kg 78.1 kg 78.7 kg    Examination:  General: No acute distress. Sitting up in bed, jaundiced. Lungs: unlabored Abdomen: Soft, nontender, nondistended Neurological: Alert and oriented 3. Moves all extremities 4 with equal strength. Cranial nerves II through XII grossly intact. Skin: Warm and dry. No rashes or lesions. Extremities: No clubbing or cyanosis. No edema.   Data Reviewed: I have personally reviewed following labs and imaging studies  CBC: Recent Labs  Lab 01/10/24 1648 01/11/24 0252 01/12/24 0800 01/13/24 0241 01/14/24 0303  WBC 4.7 7.6 4.4 4.4 3.5*  NEUTROABS  --   --  2.9  --   --   HGB 10.9* 11.0* 10.3* 10.0* 10.4*  HCT 33.8* 33.2* 30.7* 30.3* 31.7*  MCV 99.7 98.8 98.7 99.3 101.3*  PLT 130* 104* 87* 90* 86*    Basic Metabolic Panel: Recent Labs  Lab 01/10/24 1648 01/11/24 0252 01/12/24 0800 01/13/24 0241 01/14/24 0303  NA 137 139 138 139 137  K 3.7 3.5 3.8 3.4* 4.5  CL 108 109 109 109 107  CO2 20* 21* 21* 22 22  GLUCOSE 136* 92 93 84 138*  BUN 27* 25* 21 20 25*  CREATININE 1.23 1.06 1.04 1.16 1.59*  CALCIUM  9.3 9.1 8.9 8.9 9.1  MG  --   --  1.8 1.8  --   PHOS  --   --  3.1 3.7  --     GFR: Estimated Creatinine Clearance: 32.5 mL/min (Erick Oxendine) (by C-G formula based on SCr of 1.59 mg/dL (H)).  Liver Function Tests: Recent Labs  Lab 01/10/24 1648 01/11/24 0252 01/12/24 0800 01/13/24 0241 01/14/24 0303  AST 104* 93* 57* 50* 58*  ALT 89* 72* 49* 40 40  ALKPHOS 132* 116 94 85 81  BILITOT 8.1* 8.6* 7.1* 6.8* 7.6*  PROT 6.9 5.1* 6.1* 6.1* 4.6*  ALBUMIN 3.8 3.5 3.3* 3.2* 3.1*    CBG: No results for input(s): GLUCAP in the last 168 hours.   No results found for this or any previous visit (from the  past 240 hours).       Radiology Studies: No results found.       Scheduled Meds:  metoprolol  tartrate  50 mg Oral BID   sodium chloride  flush  3 mL Intravenous Q12H   Continuous Infusions:  piperacillin -tazobactam (ZOSYN )  IV 3.375 g (01/14/24 0524)     LOS: 3 days    Time spent: over 30 min    Meliton Monte, MD [facility]   To contact the attending provider between 7A-7P or the covering provider during after hours 7P-7A, please log into the web site [email or web address] and access using universal [place] password for that web site. If you do not have the password, please call the hospital operator.  01/14/2024, 9:32 AM

## 2024-01-14 NOTE — Progress Notes (Signed)
 PHARMACY - ANTICOAGULATION CONSULT NOTE  Pharmacy Consult for IV heparin  > apixaban  Indication: atrial fibrillation  No Known Allergies  Patient Measurements: Height: 6' (182.9 cm) Weight: 78.7 kg (173 lb 8 oz) IBW/kg (Calculated) : 77.6 HEPARIN  DW (KG): 78.1  Vital Signs: Temp: 97.6 F (36.4 C) (08/26 1104) Temp Source: Oral (08/26 1104) BP: 107/59 (08/26 1104) Pulse Rate: 40 (08/26 1104)  Labs: Recent Labs    01/12/24 0800 01/12/24 1755 01/13/24 0241 01/14/24 0303  HGB 10.3*  --  10.0* 10.4*  HCT 30.7*  --  30.3* 31.7*  PLT 87*  --  90* 86*  APTT 128* 80* 88* 30  HEPARINUNFRC >1.10* 0.93* 0.68 0.20*  CREATININE 1.04  --  1.16 1.59*    Estimated Creatinine Clearance: 32.5 mL/min (A) (by C-G formula based on SCr of 1.59 mg/dL (H)).   Medical History: Past Medical History:  Diagnosis Date   Atrial flutter, paroxysmal (HCC)    a. dx 04-02-2014, s/p  successful cardioversion 04-06-2014.   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 05/2007 CABG x 1: s/p SVG-OM;  b. 10/2010 Ex MV: EF 72%, inf attenuation w/o ischemia, brief run of PAT with exercise. To    Stroke (HCC)    Thrombocytopenia (HCC)    Thyroid  goiter 2013   nodular   Wears hearing aid    BILATERAL   Wears partial dentures    UPPER    Medications:  Medications Prior to Admission  Medication Sig Dispense Refill Last Dose/Taking   amoxicillin  (AMOXIL ) 500 MG tablet Take 4 tablets (2,000 mg total) by mouth as directed. Take 1 hour prior to dental work, including cleanings. 4 tablet 6 Unknown   apixaban  (ELIQUIS ) 5 MG TABS tablet TAKE 1 TABLET BY MOUTH TWICE  DAILY 180 tablet 2 01/10/2024 at  7:00 AM   Ascorbic Acid (VITAMIN C PO) Take 500 mg by mouth in the morning. Gummies 250 mg each   01/10/2024   atorvastatin  (LIPITOR) 20 MG tablet Take 40 mg by mouth every evening.   Past Week   diltiazem  (CARDIZEM ) 30 MG tablet Take 1 tablet (30 mg total) by mouth daily as needed. 90 tablet 0 Unknown   Eyelid Cleansers (OCUSOFT EYELID CLEANSING) PADS Place 1 application into the left eye in the morning.   01/10/2024   ezetimibe  (ZETIA ) 10 MG tablet TAKE 1 TABLET BY MOUTH IN THE  EVENING 90 tablet 3 Past Week   furosemide  (LASIX ) 20 MG tablet Take 1 tablet (20 mg total) by mouth daily.  90 tablet 3 01/10/2024   losartan  (COZAAR ) 25 MG tablet Take 0.5 tablets (12.5 mg total) by mouth at bedtime. 45 tablet 1 Past Week   metoprolol  tartrate (LOPRESSOR ) 25 MG tablet Take 2 tablets (50 mg total) by mouth 2 (two) times daily. 180 tablet 1 01/10/2024   nitroGLYCERIN  (NITROSTAT ) 0.4 MG SL tablet Place 1 tablet (0.4 mg total) under the tongue every 5 (five) minutes as needed. 25 tablet 3 Unknown   spironolactone  (ALDACTONE ) 25 MG tablet Take 0.5 tablets (12.5 mg total) by mouth in the morning. 45 tablet 1 01/10/2024   Scheduled:   apixaban   5 mg Oral BID   metoprolol  tartrate  50 mg Oral BID   sodium chloride  flush  3 mL Intravenous Q12H   Infusions:   piperacillin -tazobactam (ZOSYN )  IV 3.375 g (01/14/24 0524)   PRN: mouth rinse  Assessment: 89 YO M with PMH significant for Atrial Fibrillation on  Eliquis  (Last dose 08/22 AM), bladder CA s/p TURBT, remote DVT (2000), stroke, CAD s/p 1v CABG and bioprosthetic AVR and MVR 2009. Patient presents presented to [facility] for abnomal labs in T. Bili and LFTs found to be concerns for new soft tissue mass. Pharmacy consulted for heparin  for atrial fibrillation.  8/24 evening aPTT therapeutic on 950 units/hr. No overt bleeding reported. 8/25 heparin  off for ERCP per  GI ok to resume anticoagulation 8/26  Discussed with TRH - ok to resume PTA apixaban   Cr slight bump post procedure 1.5 with BL 1.1 - monitor, cbc stable   Goal of Therapy:  Heparin  level 0.3-0.7 units/ml aPTT 66-102 seconds Monitor platelets by anticoagulation protocol: Yes   Plan:  Resume PTA apixaban  5mg  BID Monitor renal function and s/s bleeding    Olam Chalk Pharm.D. CPP, BCPS Clinical Pharmacist [phone] 01/14/2024 11:55 AM   Please check AMION for all [facility] Pharmacy numbers

## 2024-01-14 NOTE — Progress Notes (Signed)
 [name] 4:00 PM  Subjective: Patient doing well without any obvious post ERCP problems and case discussed with his wife and he is tolerating his diet and case discussed with the hospital team and he has no new complaints  Objective: Vital signs stable afebrile no acute distress abdomen is soft nontender BUN and creatinine slight increase not much drop of LFTs  Assessment: Status post ERCP for CBD stricture  Plan: Okay with me to go home if labs are better and will follow-up cytology as an outpatient and I discussed with the patient and his wife calling the on Tuesday if they have not heard from us  sooner and we discussed how he will need another procedure to either put a permanent stent and or remove that stent but we will wait on the cytology to decide how to proceed and please call me if I can be of any further assistance with this hospital stay  [facility]  office [phone] After 5PM or if no answer call [phone]

## 2024-01-14 NOTE — TOC CM/SW Note (Signed)
 Transition of Care [facility]) - Inpatient Brief Assessment   Patient Details  Name: Eric [name] MRN: [id number] Date of Birth: 1929-07-26  Transition of [facility]) CM/SW Contact:    Eric Barnie Rama, RN Phone Number: 01/14/2024, 4:33 PM   Clinical Narrative: From home with spouse, has PCP and insurance on file, states has no HH services in place at this time, has walker, bsc, and a w/chair at home.  States family member (wife)  will transport them home at [employer] and family is support system, states gets medications from [place] on [address]  Pta self ambulatory.   There are no ICM  needs identified  at this time.  Please place consult for ICM needs.  Patient gives this NCM permission to speak with wife.   Transition of Care Asessment: Insurance and Status: Insurance coverage has been reviewed Patient has primary care physician: Yes Home environment has been reviewed: home with wife Prior level of function:: indep Prior/Current Home Services: No current home services Social Drivers of Health Review: SDOH reviewed no interventions necessary Readmission risk has been reviewed: Yes Transition of care needs: no transition of care needs at this time

## 2024-01-14 NOTE — Progress Notes (Signed)
 Mobility Specialist Progress Note:    01/14/24 1401  Mobility  Activity Ambulated with assistance  Level of Assistance Standby assist, set-up cues, supervision of patient - no hands on  Assistive Device Front wheel walker  Distance Ambulated (ft) 500 ft  Activity Response Tolerated well  Mobility Referral Yes  Mobility visit 1 Mobility  Mobility Specialist Start Time (ACUTE ONLY) 1400  Mobility Specialist Stop Time (ACUTE ONLY) 1425  Mobility Specialist Time Calculation (min) (ACUTE ONLY) 25 min   Pt pleasant and agreeable to session. Feeling well today and no c/o of any symptoms. Pt moving well and didn't need RW but kept it for security. Returned pt to recliner and wife in room w/ all needs met.   Venetia Keel Mobility Specialist Please Neurosurgeon or Rehab Office at [phone]

## 2024-01-15 DIAGNOSIS — R17 Unspecified jaundice: Secondary | ICD-10-CM | POA: Diagnosis not present

## 2024-01-15 DIAGNOSIS — I2510 Atherosclerotic heart disease of native coronary artery without angina pectoris: Secondary | ICD-10-CM

## 2024-01-15 DIAGNOSIS — R7989 Other specified abnormal findings of blood chemistry: Secondary | ICD-10-CM

## 2024-01-15 DIAGNOSIS — K831 Obstruction of bile duct: Secondary | ICD-10-CM | POA: Diagnosis not present

## 2024-01-15 LAB — CBC
HCT: 29.6 % — ABNORMAL LOW (ref 39.0–52.0)
Hemoglobin: 9.8 g/dL — ABNORMAL LOW (ref 13.0–17.0)
MCH: 33.4 pg (ref 26.0–34.0)
MCHC: 33.1 g/dL (ref 30.0–36.0)
MCV: 101 fL — ABNORMAL HIGH (ref 80.0–100.0)
Platelets: 84 K/uL — ABNORMAL LOW (ref 150–400)
RBC: 2.93 MIL/uL — ABNORMAL LOW (ref 4.22–5.81)
RDW: 15 % (ref 11.5–15.5)
WBC: 7.1 K/uL (ref 4.0–10.5)
nRBC: 0 % (ref 0.0–0.2)

## 2024-01-15 LAB — BASIC METABOLIC PANEL WITH GFR
Anion gap: 6 (ref 5–15)
BUN: 41 mg/dL — ABNORMAL HIGH (ref 8–23)
CO2: 21 mmol/L — ABNORMAL LOW (ref 22–32)
Calcium: 9 mg/dL (ref 8.9–10.3)
Chloride: 108 mmol/L (ref 98–111)
Creatinine, Ser: 1.52 mg/dL — ABNORMAL HIGH (ref 0.61–1.24)
GFR, Estimated: 43 mL/min — ABNORMAL LOW (ref >60)
Glucose, Bld: 121 mg/dL — ABNORMAL HIGH (ref 70–99)
Potassium: 3.9 mmol/L (ref 3.5–5.1)
Sodium: 135 mmol/L (ref 135–145)

## 2024-01-15 LAB — HEPATIC FUNCTION PANEL
ALT: 34 U/L (ref 0–44)
AST: 45 U/L — ABNORMAL HIGH (ref 15–41)
Albumin: 3.1 g/dL — ABNORMAL LOW (ref 3.5–5.0)
Alkaline Phosphatase: 65 U/L (ref 38–126)
Bilirubin, Direct: 0.7 mg/dL — ABNORMAL HIGH (ref 0.0–0.2)
Indirect Bilirubin: 5.4 mg/dL — ABNORMAL HIGH (ref 0.3–0.9)
Total Bilirubin: 6.1 mg/dL — ABNORMAL HIGH (ref 0.0–1.2)
Total Protein: 5.8 g/dL — ABNORMAL LOW (ref 6.5–8.1)

## 2024-01-15 MED ORDER — SODIUM CHLORIDE 0.9 % IV SOLN: INTRAVENOUS | Status: AC

## 2024-01-15 NOTE — Plan of Care (Signed)

## 2024-01-15 NOTE — Progress Notes (Signed)
 [facility]  PROGRESS NOTE  Eric [name] FMW:[id number] DOB: Nov 02, 1929 DOA: 01/10/2024 PCP: Rolinda Millman, MD   Brief HPI:   89 y.o. male with a history of persistent AFib, bladder CA s/p TURBT, remote DVT (2000), stroke, CAD s/p 1v CABG and bioprosthetic AVR and MVR 2009, then valve-in-valve TAVR 2020, then transseptal transcatheter valve in ring TMVR with LAMPOON at [facility] 12/19/2023 who presented to the ED today on the advice of his PCP due to abnormal MRCP. He had been recovering well from [facility], presented to routine dermatology visit, was noted to be jaundiced, labs confirmed hyperbilirubinemia and subsequent MRCP performed this morning showed concern for an obstructive soft tissue mass.    ERCP was recommended and pt told to proceed to ED.    He's now s/p ERCP with sphincterotomy and temporary plastic biliary stent placed.  Has mild AKI and persistently elevated bilirubin post procedure.  Planning to monitor another day prior to discharge.      Assessment/Plan:   Obstructive Jaundice  Elevated Liver Enzymes Painless Jaundice Soft Tissue Mass S/p ERCP 8/25 with normal appearing major papilla.  No obvious duodenal bulb or second portion of duodenal wall mass seen.  Sphincterotomy performed, biliary tree swept and nothing found.  Cells for cytology obtained in lower third of the main duct x2.  Temporary plastic biliary stent placed.  Bilirubin 7.6, AST 58, ALT 40 today.   S/p MRCP with dilation of the common bile duct with mild prominence of the central intrahepatic biliary treat - ? Of soft tissue mass along duodenal wall and distal common duct with hypoenhancement in this area on post contrast sequence.  Recommended further eval with ERCP.   Soft diet today.  Follow with GI outpatient.  Trend LFT's outpatient.  Follow pending cytology.   AKI Mild, will monitor - suspect related to NPO status, limited PO intake yesterday (8/25) Continue to hold lasix , ARB,  spironolactone  -Renal function slowly improving -Will give gentle normal saline at 50 mL/h for 12 hours   Transcatheter transeptal mitral valve replacement (12/19/2023) History of Severe MVR s/p MVR/AVR/CABGx1 (2009) Hx TAVR History of Atrial Fibrillation  Chadsvasc at least 5, will plan for bridging anticoagulation with heparin  while we hold eliquis  Appreciate cardiology recommendations -Eliquis  has been restarted  HFpEF Continue metop for now Holding lasix  due to worsening renal function ARB and spironolactone  currently on hold    CAD Dyslipidemia Statin and zetia  on hold Eliquis  on hold   Thrombocytopenia Will trend, was low early August in care everywhere labs fluctuating Will continue to monitor while he's on anticoagulation    Medications     apixaban   5 mg Oral BID   metoprolol  tartrate  50 mg Oral BID   sodium chloride  flush  3 mL Intravenous Q12H     Data Reviewed:   CBG:  No results for input(s): GLUCAP in the last 168 hours.  SpO2: 97 % O2 Flow Rate (L/min): 2 L/min    Vitals:   01/14/24 2022 01/14/24 2300 01/15/24 0354 01/15/24 0715  BP: (!) 108/59 101/65 99/63 112/76  Pulse: 67 64 60 66  Resp: 18 17 18 18   Temp: 98 F (36.7 C) 98.1 F (36.7 C) 97.9 F (36.6 C) 97.6 F (36.4 C)  TempSrc: Oral Oral Axillary Oral  SpO2: 100% 98% 99% 97%  Weight:      Height:          Data Reviewed:  Basic Metabolic Panel: Recent Labs  Lab 01/11/24 0252 01/12/24 0800  01/13/24 0241 01/14/24 0303 01/15/24 0233  NA 139 138 139 137 135  K 3.5 3.8 3.4* 4.5 3.9  CL 109 109 109 107 108  CO2 21* 21* 22 22 21*  GLUCOSE 92 93 84 138* 121*  BUN 25* 21 20 25* 41*  CREATININE 1.06 1.04 1.16 1.59* 1.52*  CALCIUM  9.1 8.9 8.9 9.1 9.0  MG  --  1.8 1.8  --   --   PHOS  --  3.1 3.7  --   --     CBC: Recent Labs  Lab 01/11/24 0252 01/12/24 0800 01/13/24 0241 01/14/24 0303 01/15/24 0233  WBC 7.6 4.4 4.4 3.5* 7.1  NEUTROABS  --  2.9  --   --   --    HGB 11.0* 10.3* 10.0* 10.4* 9.8*  HCT 33.2* 30.7* 30.3* 31.7* 29.6*  MCV 98.8 98.7 99.3 101.3* 101.0*  PLT 104* 87* 90* 86* 84*    LFT Recent Labs  Lab 01/10/24 1648 01/11/24 0252 01/12/24 0800 01/13/24 0241 01/14/24 0303  AST 104* 93* 57* 50* 58*  ALT 89* 72* 49* 40 40  ALKPHOS 132* 116 94 85 81  BILITOT 8.1* 8.6* 7.1* 6.8* 7.6*  PROT 6.9 5.1* 6.1* 6.1* 4.6*  ALBUMIN 3.8 3.5 3.3* 3.2* 3.1*     Antibiotics: Anti-infectives (From admission, onward)    Start     Dose/Rate Route Frequency Ordered Stop   01/12/24 0945  piperacillin -tazobactam (ZOSYN ) IVPB 3.375 g        3.375 g 12.5 mL/hr over 240 Minutes Intravenous Every 8 hours 01/12/24 0848          DVT prophylaxis: Apixaban   Code Status: Full code  Family Communication: Discussed with patient's wife at bedside   CONSULTS gastroenterology   Subjective   Denies any complaints   Objective    Physical Examination:   General-appears in no acute distress Heart-S1-S2, regular, no murmur auscultated Lungs-clear to auscultation bilaterally, no wheezing or crackles auscultated Abdomen-soft, nontender, no organomegaly Extremities-no edema in the lower extremities Neuro-alert, oriented x3, no focal deficit noted   Status is: Inpatient:             Eric [name] Brod   [facility] If 7PM-7AM, please contact night-coverage at [email or web address], Office  [phone]   01/15/2024, 9:30 AM  LOS: 4 days

## 2024-01-15 NOTE — Plan of Care (Signed)
  Problem: Education: Goal: Knowledge of General Education information will improve Description: Including pain rating scale, medication(s)/side effects and non-pharmacologic comfort measures Outcome: Progressing   Problem: Activity: Goal: Risk for activity intolerance will decrease Outcome: Progressing   Problem: Coping: Goal: Level of anxiety will decrease Outcome: Progressing   Problem: Elimination: Goal: Will not experience complications related to urinary retention Outcome: Progressing   Problem: Safety: Goal: Ability to remain free from injury will improve Outcome: Progressing

## 2024-01-15 NOTE — Progress Notes (Addendum)
 Eric [name] Eric [name] Eric [name] 5:49 PM  Subjective: Patient doing fine from a GI standpoint but does have some skin breakdown and is still weak and unfortunately his brushings are still pending and he has no new complaints and case discussed with his wife and we answered all of her questions  Objective: Vital signs stable afebrile no acute distress abdomen is soft nontender BUN increased a little creatinine slight decrease bilirubin slight decrease but with it all being indirect that implies it is probably coming from gilbert's or a hematologic problem and not a liver issue and his CBC is stable  Assessment: Abnormal MRCP and distal CBD stricture  Plan: I will follow-up his biopsies as an outpatient and then decide follow-up or how to proceed and please call me if I can be of any further assistance with this hospital stay  [facility]  office [phone] After 5PM or if no answer call [phone]

## 2024-01-15 NOTE — Progress Notes (Signed)
 Mobility Specialist Progress Note;   01/15/24 1451  Mobility  Activity Ambulated with assistance  Level of Assistance Standby assist, set-up cues, supervision of patient - no hands on  Assistive Device Front wheel walker  Distance Ambulated (ft) 500 ft  Activity Response Tolerated well  Mobility Referral Yes  Mobility visit 1 Mobility  Mobility Specialist Start Time (ACUTE ONLY) 1451  Mobility Specialist Stop Time (ACUTE ONLY) 1501  Mobility Specialist Time Calculation (min) (ACUTE ONLY) 10 min   Pt eager for mobility. Required no physical assistance during ambulation, SV for safety. VSS throughout. Provided pillow for pt to sit on in chair to provide comfort. Pt left in chair with all needs met, call bell in reach. Wife present.   Lauraine Erm Mobility Specialist Please contact via SecureChat or [employer] [phone]

## 2024-01-16 DIAGNOSIS — Z952 Presence of prosthetic heart valve: Secondary | ICD-10-CM

## 2024-01-16 DIAGNOSIS — R7989 Other specified abnormal findings of blood chemistry: Secondary | ICD-10-CM | POA: Diagnosis not present

## 2024-01-16 DIAGNOSIS — Z8551 Personal history of malignant neoplasm of bladder: Secondary | ICD-10-CM

## 2024-01-16 DIAGNOSIS — R17 Unspecified jaundice: Secondary | ICD-10-CM | POA: Diagnosis not present

## 2024-01-16 DIAGNOSIS — Z86711 Personal history of pulmonary embolism: Secondary | ICD-10-CM

## 2024-01-16 DIAGNOSIS — K831 Obstruction of bile duct: Secondary | ICD-10-CM | POA: Diagnosis not present

## 2024-01-16 LAB — COMPREHENSIVE METABOLIC PANEL WITH GFR
ALT: 27 U/L (ref 0–44)
AST: 36 U/L (ref 15–41)
Albumin: 3 g/dL — ABNORMAL LOW (ref 3.5–5.0)
Alkaline Phosphatase: 55 U/L (ref 38–126)
Anion gap: 7 (ref 5–15)
BUN: 47 mg/dL — ABNORMAL HIGH (ref 8–23)
CO2: 23 mmol/L (ref 22–32)
Calcium: 8.9 mg/dL (ref 8.9–10.3)
Chloride: 109 mmol/L (ref 98–111)
Creatinine, Ser: 1.15 mg/dL (ref 0.61–1.24)
GFR, Estimated: 60 mL/min — ABNORMAL LOW (ref >60)
Glucose, Bld: 107 mg/dL — ABNORMAL HIGH (ref 70–99)
Potassium: 4.7 mmol/L (ref 3.5–5.1)
Sodium: 139 mmol/L (ref 135–145)
Total Bilirubin: 5.1 mg/dL — ABNORMAL HIGH (ref 0.0–1.2)
Total Protein: 5.6 g/dL — ABNORMAL LOW (ref 6.5–8.1)

## 2024-01-16 LAB — CBC
HCT: 27.5 % — ABNORMAL LOW (ref 39.0–52.0)
Hemoglobin: 8.9 g/dL — ABNORMAL LOW (ref 13.0–17.0)
MCH: 33.5 pg (ref 26.0–34.0)
MCHC: 32.4 g/dL (ref 30.0–36.0)
MCV: 103.4 fL — ABNORMAL HIGH (ref 80.0–100.0)
Platelets: 80 K/uL — ABNORMAL LOW (ref 150–400)
RBC: 2.66 MIL/uL — ABNORMAL LOW (ref 4.22–5.81)
RDW: 15.2 % (ref 11.5–15.5)
WBC: 6.5 K/uL (ref 4.0–10.5)
nRBC: 0 % (ref 0.0–0.2)

## 2024-01-16 LAB — CYTOLOGY - NON PAP

## 2024-01-16 NOTE — Progress Notes (Signed)
 Mobility Specialist Progress Note:    01/16/24 0957  Mobility  Activity Ambulated with assistance  Level of Assistance Contact guard assist, steadying assist  Assistive Device Front wheel walker  Distance Ambulated (ft) 50 ft  Activity Response Tolerated fair  Mobility Referral Yes  Mobility visit 1 Mobility  Mobility Specialist Start Time (ACUTE ONLY) 0957  Mobility Specialist Stop Time (ACUTE ONLY) 1018  Mobility Specialist Time Calculation (min) (ACUTE ONLY) 21 min   Pt received from recliner and agreeable to session. Pt c/o of sudden fatigue but otherwise is doing alright. Pt previously before session bathe and cleaned themselves.Returned pt to bed w/ all needs met and sister in room.   Venetia Keel Mobility Specialist Please Neurosurgeon or Rehab Office at [phone]

## 2024-01-16 NOTE — Care Management Important Message (Signed)
 Important Message  Patient Details  Name: Eric [name] MRN: [id number] Date of Birth: 04-12-1930   Important Message Given:  Yes - Medicare IM     Vonzell Arrie Sharps 01/16/2024, 11:50 AM

## 2024-01-16 NOTE — TOC Transition Note (Signed)
 Transition of Care [facility]) - Discharge Note   Patient Details  Name: Eric [name] MRN: [id number] Date of Birth: 02-Aug-1929  Transition [facility]) CM/SW Contact:  [name], RN Phone Number: 01/16/2024, 12:04 PM   Clinical Narrative:    For dc today, has no needs.         Patient Goals and CMS Choice            Discharge Placement                       Discharge Plan and Services Additional resources added to the After Visit Summary for                                       Social Drivers of Health (SDOH) Interventions SDOH Screenings   Food Insecurity: No Food Insecurity (01/10/2024)  Housing: Low Risk  (01/10/2024)  Transportation Needs: No Transportation Needs (01/10/2024)  Utilities: Not At Risk (01/10/2024)  Depression (PHQ2-9): Low Risk  (01/03/2021)  Financial Resource Strain: Low Risk  (12/19/2023)   Received from [facility]  Physical Activity: Insufficiently Active (12/19/2023)   Received from [facility]  Social Connections: Socially Integrated (01/10/2024)  Stress: Patient Unable To Answer (12/19/2023)   Received from [facility]  Tobacco Use: Low Risk  (01/13/2024)     Readmission Risk Interventions    01/14/2024    4:32 PM 07/01/2023    3:23 PM  Readmission Risk Prevention Plan  Post Dischage Appt  Complete  Medication Screening  Complete  Transportation Screening Complete Complete  Home Care Screening Complete   Medication Review (RN CM) Complete

## 2024-01-16 NOTE — Discharge Summary (Addendum)
 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 [facility]: 89 y.o. male with a history of persistent AFib, bladder CA s/p TURBT, remote DVT (2000), stroke, CAD s/p 1v CABG and bioprosthetic AVR and MVR 2009, then valve-in-valve TAVR 2020, then transseptal transcatheter valve in ring TMVR with L[facility]POON at [facility] 12/19/2023 who presented to the ED today on the advice of his PCP due to abnormal MRCP. He had been recovering well from [facility], presented to routine dermatology visit, was noted to be jaundiced, labs confirmed hyperbilirubinemia and subsequent MRCP performed this morning showed concern for an obstructive soft tissue mass.    ERCP was recommended and pt told to proceed to ED.    He's now s/p ERCP with sphincterotomy and temporary plastic biliary stent placed.  Has mild AKI and persistently elevated bilirubin post procedure.  Planning to monitor another day prior to discharge.    Assessment and Plan:  Obstructive Jaundice  Elevated Liver Enzymes Painless Jaundice Soft Tissue Mass S/p ERCP 8/25 with normal appearing major papilla.  No obvious duodenal bulb or second portion of duodenal wall mass seen.  Sphincterotomy performed, biliary tree swept and nothing found.  Cells for cytology obtained in lower third of the main duct x2.   Temporary plastic biliary stent placed.  Bilirubin 7.6, AST 58, ALT 40 today.   S/p MRCP with dilation of the common bile duct with mild prominence of the central intrahepatic biliary treat - ? Of soft tissue mass along duodenal wall and distal common duct with hypoenhancement in this area on post contrast sequence.  Recommended further eval with ERCP.   Soft diet today.  Follow with GI outpatient.  Trend LFT's outpatient.  Follow pending cytology.   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/AVR/CABGx1 (2009) Hx TAVR History of Atrial Fibrillation  Chadsvasc at least 5, will plan for bridging anticoagulation with heparin  while we hold eliquis  Appreciate cardiology recommendations -Eliquis  has been restarted - Will stop statin and zetia  due to elevated liver enzymes   HFpEF Continue metop for now Holding lasix  due to worsening renal function Lasix  and Aldactone  have been restarted   CAD Dyslipidemia Statin and zetia  on hold Eliquis  has been restarted   Thrombocytopenia Will trend, was low early August in care everywhere labs Fluctuating - Follow-up PCP for checking CBC and also might need hematology consultation as outpatient           Consultants: Gastroenterology Procedures performed: ERCP Disposition: Home Diet recommendation:  Discharge Diet Orders (From admission, onward)     Start     Ordered   01/16/24 0000  Diet - low sodium heart healthy  01/16/24 1140           Regular diet DISCHARGE MEDICATION: Allergies as of 01/16/2024   No Known Allergies      Medication List     STOP taking these medications    atorvastatin  20 MG tablet Commonly known as: LIPITOR   diltiazem  30 MG tablet Commonly  known as: Cardizem    ezetimibe  10 MG tablet Commonly known as: ZETIA    losartan  25 MG tablet Commonly known as: COZAAR        TAKE these medications    amoxicillin  500 MG tablet Commonly known as: AMOXIL  Take 4 tablets (2,000 mg total) by mouth as directed. Take 1 hour prior to dental work, including cleanings.   Eliquis  5 MG Tabs tablet Generic drug: apixaban  TAKE 1 TABLET BY MOUTH TWICE  DAILY   furosemide  20 MG tablet Commonly known as: LASIX  Take 1 tablet (20 mg total) by mouth daily.   metoprolol  tartrate 25 MG tablet Commonly known as: LOPRESSOR  Take 2 tablets (50 mg total) by mouth 2 (two) times daily.   nitroGLYCERIN  0.4 MG SL tablet Commonly known as: NITROSTAT  Place 1 tablet (0.4 mg total) under the tongue every 5 (five) minutes as needed.   OcuSoft Eyelid Cleansing Pads Place 1 application into the left eye in the morning.   spironolactone  25 MG tablet Commonly known as: ALDACTONE  Take 0.5 tablets (12.5 mg total) by mouth in the morning.   VITAMIN C PO Take 500 mg by mouth in the morning. Gummies 250 mg each        Follow-up Information     Rolinda Millman, MD. Go on 01/24/2024.   Specialty: Family Medicine Why: @ 11:30AM Contact information: [address] [place] [address] [phone]         Rosalie Kitchens, MD. Schedule an appointment as soon as possible for a visit.   Specialty: Gastroenterology Contact information: [address] [place] [address] [phone]                Discharge Exam: Fredricka Weights   01/13/24 1235 01/14/24 0433 01/16/24 0500  Weight: 78.1 kg 78.7 kg 78.9 kg   General-appears in no acute distress Heart-S1-S2, regular, no murmur auscultated Lungs-clear to auscultation bilaterally, no wheezing or crackles auscultated Abdomen-soft, nontender, no organomegaly Extremities-no edema in the lower extremities Neuro-alert, oriented x3, no focal deficit noted  Condition at  discharge: Good  The results of significant diagnostics from this hospitalization (including imaging, microbiology, ancillary and laboratory) are listed below for reference.   Imaging Studies: DG ERCP Result Date: 01/14/2024 CLINICAL DATA:  Abnormal MRCP jaundice. EXAM: ERCP 6 fluoroscopic intra procedural images TECHNIQUE: Multiple spot images obtained with the fluoroscopic device and submitted for interpretation post-procedure. FLUOROSCOPY: Radiation Exposure Index (as provided by the fluoroscopic device): 35.43 mGy Kerma COMPARISON:  None Available. FINDINGS: Fluoroscopic intra procedural images of the right upper quadrant were performed and submitted for interpretation. Flexible endoscopy device is present with guidewire in the common bile duct. Common bile duct is markedly dilated without evidence of intraductal debris. Smooth tapering to the ampulla. Balloon sweep performed. No extravasation of contrast. IMPRESSION: Ductal dilatation without filling defect. These images were submitted for radiologic interpretation only. Please see the procedural report for the amount of contrast and the fluoroscopy time utilized. Electronically Signed   By: Cordella Banner   On: 01/14/2024 15:59   MR ABDOMEN MRCP W WO CONTAST Result Date: 01/10/2024 CLINICAL DATA:  Dilated bile ducts. EXAM:  MRI ABDOMEN WITHOUT AND WITH CONTRAST (INCLUDING MRCP) TECHNIQUE: Multiplanar multisequence MR imaging of the abdomen was performed both before and after the administration of intravenous contrast. Heavily T2-weighted images of the biliary and pancreatic ducts were obtained, and three-dimensional MRCP images were rendered by post processing. CONTRAST:  7.5mL GADAVIST  GADOBUTROL  1 MMOL/ML IV SOLN COMPARISON:  Multiple priors including CTA chest Oct 15, 2022 and CT abdomen pelvis August 23, 2010. FINDINGS: Lower chest: Trace right pleural effusion. Hepatobiliary: Diffuse hepatic steatosis. No suspicious hepatic lesion. Distended  gallbladder.  No evidence of acute cholecystitis. Marked dilation of the common bile duct with mild prominence of the central intrahepatic biliary tree. Common duct measures up to 2.1 cm in diameter. No discrete choledocholithiasis. Question of a soft tissue mass along the duodenal wall and distal common duct on image 28/12 with hypoenhancement in this area on postcontrast image 73/1001. DWI and ADC sequences are significantly limited by motion. Pancreas: No pancreatic ductal dilation or evidence of acute inflammation. Spleen:  No splenomegaly or focal splenic lesion. Adrenals/Urinary Tract: No suspicious adrenal nodule/mass. No hydronephrosis. Stomach/Bowel: Visualized portions within the abdomen are unremarkable. Vascular/Lymphatic: No pathologically enlarged lymph nodes identified. No abdominal aortic aneurysm demonstrated. Other:  No significant abdominal free fluid. Musculoskeletal: No suspicious bone lesions identified. IMPRESSION: Motion degraded examination limits sensitivity and specificity. Within this context: 1. Marked dilation of the common bile duct with mild prominence of the central intrahepatic biliary tree. No discrete choledocholithiasis. Question of a soft tissue mass along the duodenal wall and distal common duct with hypoenhancement in this area on postcontrast sequence. DWI and ADC sequences are significantly limited by motion. Recommend further evaluation with ERCP. 2. Gallbladder is distended without evidence of acute cholecystitis. 3. Diffuse hepatic steatosis. 4. Trace right pleural effusion. Electronically Signed   By: Reyes Holder M.D.   On: 01/10/2024 14:09   MR 3D Recon At Scanner Result Date: 01/10/2024 CLINICAL DATA:  Dilated bile ducts. EXAM: MRI ABDOMEN WITHOUT AND WITH CONTRAST (INCLUDING MRCP) TECHNIQUE: Multiplanar multisequence MR imaging of the abdomen was performed both before and after the administration of intravenous contrast. Heavily T2-weighted images of the biliary  and pancreatic ducts were obtained, and three-dimensional MRCP images were rendered by post processing. CONTRAST:  7.5mL GADAVIST  GADOBUTROL  1 MMOL/ML IV SOLN COMPARISON:  Multiple priors including CTA chest Oct 15, 2022 and CT abdomen pelvis August 23, 2010. FINDINGS: Lower chest: Trace right pleural effusion. Hepatobiliary: Diffuse hepatic steatosis. No suspicious hepatic lesion. Distended gallbladder.  No evidence of acute cholecystitis. Marked dilation of the common bile duct with mild prominence of the central intrahepatic biliary tree. Common duct measures up to 2.1 cm in diameter. No discrete choledocholithiasis. Question of a soft tissue mass along the duodenal wall and distal common duct on image 28/12 with hypoenhancement in this area on postcontrast image 73/1001. DWI and ADC sequences are significantly limited by motion. Pancreas: No pancreatic ductal dilation or evidence of acute inflammation. Spleen:  No splenomegaly or focal splenic lesion. Adrenals/Urinary Tract: No suspicious adrenal nodule/mass. No hydronephrosis. Stomach/Bowel: Visualized portions within the abdomen are unremarkable. Vascular/Lymphatic: No pathologically enlarged lymph nodes identified. No abdominal aortic aneurysm demonstrated. Other:  No significant abdominal free fluid. Musculoskeletal: No suspicious bone lesions identified. IMPRESSION: Motion degraded examination limits sensitivity and specificity. Within this context: 1. Marked dilation of the common bile duct with mild prominence of the central intrahepatic biliary tree. No discrete choledocholithiasis. Question of a soft tissue mass along the duodenal wall and distal common duct with  hypoenhancement in this area on postcontrast sequence. DWI and ADC sequences are significantly limited by motion. Recommend further evaluation with ERCP. 2. Gallbladder is distended without evidence of acute cholecystitis. 3. Diffuse hepatic steatosis. 4. Trace right pleural effusion.  Electronically Signed   By: Reyes Holder M.D.   On: 01/10/2024 14:09   US  Abdomen Limited RUQ (LIVER/GB) Result Date: 01/08/2024 CLINICAL DATA:  Jaundice.  Hyperbilirubinemia EXAM: ULTRASOUND ABDOMEN LIMITED RIGHT UPPER QUADRANT COMPARISON:  None Available. FINDINGS: Gallbladder: No gallstones or wall thickening visualized. No sonographic Murphy sign noted by sonographer. Common bile duct: Diameter: Dilated to 20 mm. Liver: No focal lesion identified. Within normal limits in parenchymal echogenicity. No intrahepatic duct dilatation identified. Portal vein is patent on color Doppler imaging with normal direction of blood flow towards the liver. Other: None. IMPRESSION: 1. No evidence acute cholecystitis. 2. Dilatation of the common bile duct without obstructing lesion identified. In patient with elevated bilirubin, consider MRCP for further evaluation of the common bile duct. Electronically Signed   By: Jackquline Boxer M.D.   On: 01/08/2024 16:14    Microbiology: Results for orders placed or performed during the hospital encounter of 06/29/23  Resp panel by RT-PCR (RSV, Flu A&B, Covid) Anterior Nasal Swab     Status: None   Collection Time: 06/29/23  8:49 AM   Specimen: Anterior Nasal Swab  Result Value Ref Range Status   SARS Coronavirus 2 by RT PCR NEGATIVE NEGATIVE Final   Influenza A by PCR NEGATIVE NEGATIVE Final   Influenza B by PCR NEGATIVE NEGATIVE Final    Comment: (NOTE) The Xpert Xpress SARS-CoV-2/FLU/RSV plus assay is intended as an aid in the diagnosis of influenza from Nasopharyngeal swab specimens and should not be used as a sole basis for treatment. Nasal washings and aspirates are unacceptable for Xpert Xpress SARS-CoV-2/FLU/RSV testing.  Fact Sheet for Patients: BloggerCourse.com  Fact Sheet for Healthcare Providers: SeriousBroker.it  This test is not yet approved or cleared by the [place]  FDA and has been  authorized for detection and/or diagnosis of SARS-CoV-2 by FDA under an Emergency Use Authorization (EUA). This EUA will remain in effect (meaning this test can be used) for the duration of the COVID-19 declaration under Section 564(b)(1) of the Act, 21 U.S.C. section 360bbb-3(b)(1), unless the authorization is terminated or revoked.     Resp Syncytial Virus by PCR NEGATIVE NEGATIVE Final    Comment: (NOTE) Fact Sheet for Patients: BloggerCourse.com  Fact Sheet for Healthcare Providers: SeriousBroker.it  This test is not yet approved or cleared by the [place]  FDA and has been authorized for detection and/or diagnosis of SARS-CoV-2 by FDA under an Emergency Use Authorization (EUA). This EUA will remain in effect (meaning this test can be used) for the duration of the COVID-19 declaration under Section 564(b)(1) of the Act, 21 U.S.C. section 360bbb-3(b)(1), unless the authorization is terminated or revoked.  Performed at [facility], [address], [place], [place] [address]   Blood culture (routine x 2)     Status: None   Collection Time: 06/29/23  8:53 AM   Specimen: BLOOD LEFT FOREARM  Result Value Ref Range Status   Specimen Description BLOOD LEFT FOREARM  Final   Special Requests   Final    BOTTLES DRAWN AEROBIC AND ANAEROBIC Blood Culture adequate volume   Culture   Final    NO GROWTH 5 DAYS Performed at [facility], [address] [place], [place] [address]    Report Status  07/04/2023 FINAL  Final  Blood culture (routine x 2)     Status: None   Collection Time: 06/29/23  8:58 AM   Specimen: BLOOD RIGHT FOREARM  Result Value Ref Range Status   Specimen Description BLOOD RIGHT FOREARM  Final   Special Requests   Final    BOTTLES DRAWN AEROBIC AND ANAEROBIC Blood Culture adequate volume   Culture   Final    NO GROWTH 5 DAYS Performed at [facility], [address], [place], [place]  [address]    Report Status 07/04/2023 FINAL  Final  Urine Culture     Status: Abnormal   Collection Time: 06/29/23  9:48 AM   Specimen: Urine, Clean Catch  Result Value Ref Range Status   Specimen Description URINE, CLEAN CATCH  Final   Special Requests   Final    NONE Performed at [facility], [address], [place], [place] [address]    Culture >=100,000 COLONIES/mL ESCHERICHIA COLI (A)  Final   Report Status 07/01/2023 FINAL  Final   Organism ID, Bacteria ESCHERICHIA COLI (A)  Final      Susceptibility   Escherichia coli - MIC*    AMPICILLIN >=32 RESISTANT Resistant     CEFAZOLIN  <=4 SENSITIVE Sensitive     CEFEPIME <=0.12 SENSITIVE Sensitive     CEFTRIAXONE  <=0.25 SENSITIVE Sensitive     CIPROFLOXACIN  <=0.25 SENSITIVE Sensitive     GENTAMICIN  <=1 SENSITIVE Sensitive     IMIPENEM <=0.25 SENSITIVE Sensitive     NITROFURANTOIN <=16 SENSITIVE Sensitive     TRIMETH/SULFA >=320 RESISTANT Resistant     AMPICILLIN/SULBACTAM 8 SENSITIVE Sensitive     PIP/TAZO <=4 SENSITIVE Sensitive ug/mL    * >=100,000 COLONIES/mL ESCHERICHIA COLI    Labs: CBC: Recent Labs  Lab 01/12/24 0800 01/13/24 0241 01/14/24 0303 01/15/24 0233 01/16/24 0219  WBC 4.4 4.4 3.5* 7.1 6.5  NEUTROABS 2.9  --   --   --   --   HGB 10.3* 10.0* 10.4* 9.8* 8.9*  HCT 30.7* 30.3* 31.7* 29.6* 27.5*  MCV 98.7 99.3 101.3* 101.0* 103.4*  PLT 87* 90* 86* 84* 80*   Basic Metabolic Panel: Recent Labs  Lab 01/12/24 0800 01/13/24 0241 01/14/24 0303 01/15/24 0233 01/16/24 0219  NA 138 139 137 135 139  K 3.8 3.4* 4.5 3.9 4.7  CL 109 109 107 108 109  CO2 21* 22 22 21* 23  GLUCOSE 93 84 138* 121* 107*  BUN 21 20 25* 41* 47*  CREATININE 1.04 1.16 1.59* 1.52* 1.15  CALCIUM  8.9 8.9 9.1 9.0 8.9  MG 1.8 1.8  --   --   --   PHOS 3.1 3.7  --   --   --    Liver Function Tests: Recent Labs  Lab 01/12/24 0800 01/13/24 0241 01/14/24 0303 01/15/24 0233 01/16/24 0219  AST 57* 50* 58* 45* 36  ALT 49* 40 40 34  27  ALKPHOS 94 85 81 65 55  BILITOT 7.1* 6.8* 7.6* 6.1* 5.1*  PROT 6.1* 6.1* 4.6* 5.8* 5.6*  ALBUMIN 3.3* 3.2* 3.1* 3.1* 3.0*   CBG: No results for input(s): GLUCAP in the last 168 hours.  Discharge time spent: Greater than 30 minutes.  Signed: Sabas GORMAN Brod, MD [facility] 01/16/2024

## 2024-01-23 LAB — COMPREHENSIVE METABOLIC PANEL WITH GFR: EGFR: 57

## 2024-01-29 ENCOUNTER — Other Ambulatory Visit: Payer: Self-pay | Admitting: Nurse Practitioner

## 2024-01-29 ENCOUNTER — Other Ambulatory Visit: Payer: Self-pay | Admitting: *Deleted

## 2024-01-29 DIAGNOSIS — D696 Thrombocytopenia, unspecified: Secondary | ICD-10-CM

## 2024-01-29 DIAGNOSIS — D649 Anemia, unspecified: Secondary | ICD-10-CM

## 2024-01-29 DIAGNOSIS — D509 Iron deficiency anemia, unspecified: Secondary | ICD-10-CM

## 2024-01-30 ENCOUNTER — Ambulatory Visit
Admission: RE | Admit: 2024-01-30 | Discharge: 2024-01-30 | Disposition: A | Discharge status: Home / Self Care | Source: Ambulatory Visit | Attending: Cardiology | Admitting: Cardiology

## 2024-01-30 ENCOUNTER — Ambulatory Visit: Admitting: Physician Assistant

## 2024-01-30 VITALS — BP 120/66 | HR 58 | Ht 72.0 in | Wt 169.9 lb

## 2024-01-30 DIAGNOSIS — Z952 Presence of prosthetic heart valve: Secondary | ICD-10-CM

## 2024-01-30 DIAGNOSIS — I10 Essential (primary) hypertension: Secondary | ICD-10-CM | POA: Insufficient documentation

## 2024-01-30 DIAGNOSIS — I824Z9 Acute embolism and thrombosis of unspecified deep veins of unspecified distal lower extremity: Secondary | ICD-10-CM | POA: Diagnosis present

## 2024-01-30 DIAGNOSIS — I4821 Permanent atrial fibrillation: Secondary | ICD-10-CM | POA: Diagnosis present

## 2024-01-30 DIAGNOSIS — I25810 Atherosclerosis of coronary artery bypass graft(s) without angina pectoris: Secondary | ICD-10-CM | POA: Insufficient documentation

## 2024-01-30 DIAGNOSIS — E785 Hyperlipidemia, unspecified: Secondary | ICD-10-CM | POA: Insufficient documentation

## 2024-01-30 DIAGNOSIS — I5033 Acute on chronic diastolic (congestive) heart failure: Secondary | ICD-10-CM

## 2024-01-30 DIAGNOSIS — I5032 Chronic diastolic (congestive) heart failure: Secondary | ICD-10-CM | POA: Insufficient documentation

## 2024-01-30 LAB — ECHOCARDIOGRAM COMPLETE
AR max vel: 2.12 cm2
AV Area VTI: 2.43 cm2
AV Area mean vel: 2.36 cm2
AV Mean grad: 10.4 mmHg
AV Peak grad: 22.8 mmHg
Ao pk vel: 2.39 m/s
Area-P 1/2: 1.61 cm2
MV VTI: 1.82 cm2
S' Lateral: 2.9 cm

## 2024-01-30 MED ORDER — FUROSEMIDE 20 MG PO TABS: 40.0000 mg | ORAL_TABLET | Freq: Every day | ORAL | 3 refills | Status: AC

## 2024-01-30 MED ORDER — EZETIMIBE 10 MG PO TABS: 10.0000 mg | ORAL_TABLET | Freq: Every day | ORAL | Status: AC

## 2024-01-30 MED ORDER — ATORVASTATIN CALCIUM 20 MG PO TABS: 20.0000 mg | ORAL_TABLET | Freq: Every day | ORAL | Status: AC

## 2024-01-30 MED ORDER — DILTIAZEM HCL 30 MG PO TABS
30.0000 mg | ORAL_TABLET | ORAL | Status: DC | PRN
Start: 2024-01-30 — End: 2024-03-10

## 2024-01-30 NOTE — Progress Notes (Addendum)
 [facility]   MULTIDISCIPLINARY HEART VALVE CLINIC                                     Cardiology Office Note:    Date:  01/31/2024   ID:  Eric [name], DOB March 30, 1930, MRN [id number]  PCP:  Eric Millman, MD  [facility] Cardiologist:  Eric Fell, MD  [facility] Structural heart: Eric Fell, MD [facility] Electrophysiologist:  None   Referring MD: Eric Millman, MD   Post hospital follow up s/p TMVR at [facility]   History of Present Illness:    Eric [name] is a 89 y.o. male with a hx of persistent atrial fibrillation on Eliquis , coronary disease and valvular heart disease who underwent single-vessel CABG with a SVG to OM, bioprosthetic AVR using a 23 mm Mitroflow pericardial valve and mitral valve repair with a 28 mm Sorin 3D Memo annuloplasty ring in 2009, s/p VIV TAVR in 2020 with transcatheter valve in ring TMVR (26mm Sapien valve) with LAMPOON as well as mitral valve plugging at [facility] medical center on 12/19/23 who presents to clinic for follow up.   [name] has a history of prior bioprosthetic mitral valve repair, bioprosthetic AVR, and CABG x 1 with SVG-OM1 in 2009.  He has a history of atypical atrial flutter s/p DCCV in 2015 and 2019.  He subsequently underwent TAVR 11/2022 severe AI.   He has had severe mitral regurgitation and was admitted in 06/2023 with atrial fibrillation with RVR and heart failure.  Improved with intravenous diuresis.  He continued to have fatigue and exertional dyspnea with moderate exertion.  TEE on 10/04/2023 showed severe mitral regurgitation with perivalvular leak between the annulus and the ring. The aortic valve prosthesis is functioning appropriately with no stenosis and mild regurgitation. Left ventricular ejection fraction of 65 to 70%. He was sent to [facility] medical center for treatment and underwent successful transseptal transcatheter valve in ring TMVR (26mm Sapien valve) with LAMPOON as well as mitral  valve plugging at [facility] medical center on 12/19/23. Post op TEE showed a well seated MV paravalvular plug. Well seated bioprosthetic mitral valve with good leaflet motion (final MV Mean PG 3 mmHg), trivial central mitral regurgitation, and minimal residual paravalvular leak. Native mitral leaflets present and moving below the prosthetic valve with no signficant LVOT obtsruction (LVOT MaxPG 8 mmHg and Mean PG 5 mmHg). 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   Recently admitted 12/2023 after outpatient labs for jaundice noticed by the dermatologist showed elevated T. bili and LFTs. He underwent MRCP which showed a soft tissue mass and obstructive hyperbilirubinemia. s/p ERCP with sphincterotomy and temporary plastic biliary stent placed.   Follow up labs showed creat 1.15, K 4.7, normal AST/ALT.Alk phos, bili still elevated ~5.1, Hg 8.9, plt 80. He was referred to hematology for new anemia.   Today the patient presents to clinic for follow up. Here with wife. Just came from Dr. Pernell office. Told he could go back on his lipitor and Zetia  if okay with us . No CP or SOB. No LE edema, orthopnea or PND. No dizziness or syncope. No blood in stool or urine. No palpitations. Just getting his strength back from recent admission.       Past Medical History:  Diagnosis Date   Atrial flutter, paroxysmal (HCC)    a. dx 04-02-2014, s/p  successful cardioversion 04-06-2014.  Chronic anticoagulation    on Eliquis --  due to recurrent dvt's and pe's   Dyslipidemia    History of bladder cancer urologist-  dr chauncey   02-13-2016  s/p TURBT per path high grade papillary urothelial carcinoma   History of DVT (deep vein thrombosis)    2000-- RLE   History of melanoma excision    left flank;  07/ 2014 left lower leg and right upper chest   History of prostate cancer    Gleason 6--  s/p  radical prostatectomy 06/ 2000 in [place], [place]---  no recurrence   History of pulmonary embolus (PE)    2000 &  2005  bilateral   Hx of valvuloplasty 06/19/2007   a. 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 05/2007 CABG x 1: s/p SVG-OM;  b. 10/2010 Ex MV: EF 72%, inf attenuation w/o ischemia, brief run of PAT with exercise. To   Stroke (HCC)    Thrombocytopenia (HCC)    Thyroid  goiter 2013   nodular   Wears hearing aid    BILATERAL   Wears partial dentures    UPPER     Current Medications: Current Meds  Medication Sig   amoxicillin  (AMOXIL ) 500 MG tablet Take 4 tablets (2,000 mg total) by mouth as directed. Take 1 hour prior to dental work, including cleanings.   apixaban  (ELIQUIS ) 5 MG TABS tablet TAKE 1 TABLET BY MOUTH TWICE  DAILY   Ascorbic Acid (VITAMIN C PO) Take 500 mg by mouth in the morning. Gummies 250 mg each   atorvastatin  (LIPITOR) 20 MG tablet Take 1 tablet (20 mg total) by mouth daily.   diltiazem  (CARDIZEM ) 30 MG tablet Take 1 tablet (30 mg total) by mouth as needed. Take as needed for palpitations   Eyelid Cleansers (OCUSOFT EYELID CLEANSING) PADS Place 1 application into the left eye in the morning.   ezetimibe  (ZETIA ) 10 MG tablet Take 1 tablet (10 mg total) by mouth daily.   metoprolol  tartrate (LOPRESSOR ) 25 MG tablet Take 2 tablets (50 mg total) by mouth 2 (two) times daily.   nitroGLYCERIN  (NITROSTAT ) 0.4 MG SL tablet Place 1 tablet (0.4 mg total) under the tongue every 5 (five) minutes as needed.   spironolactone  (ALDACTONE ) 25 MG tablet Take 0.5 tablets (12.5 mg total) by mouth in the morning.   traZODone (DESYREL) 50 MG tablet Take 75 mg by mouth at bedtime.   [DISCONTINUED] furosemide  (LASIX ) 20 MG tablet Take 1 tablet (20 mg total) by mouth daily.      ROS:   Please see the history of present illness.    All other  systems reviewed and are negative.  EKGs       Risk Assessment/Calculations:    CHA2DS2-VASc Score = 7   This indicates a 11.2% annual risk of stroke. The patient's score is based upon: CHF History: 1 HTN History: 1 Diabetes History: 0 Stroke History: 2 Vascular Disease History: 1 Age Score: 2 Gender Score: 0          Physical Exam:    VS:  BP 120/66   Pulse (!) 58   Ht 6' (1.829 m)   Wt 169 lb 14.4 oz (77.1 kg)   BMI 23.04 kg/m  Wt Readings from Last 3 Encounters:  01/30/24 169 lb 14.4 oz (77.1 kg)  01/16/24 174 lb (78.9 kg)  12/30/23 174 lb (78.9 kg)     GEN: Well nourished, well developed in no acute distress, yellowish appearance.  NECK: No JVD CARDIAC: irreg irreg, no murmurs, rubs, gallops RESPIRATORY:  Clear to auscultation without rales, wheezing or rhonchi  ABDOMEN: Soft, non-tender, non-distended EXTREMITIES:  No edema; No deformity.   ASSESSMENT:    1. S/P transcatheter mitral valve replacement (TMVR)   2. Chronic diastolic congestive heart failure (HCC)   3. Primary hypertension   4. Permanent atrial fibrillation (HCC)   5. Coronary artery disease involving coronary bypass graft of native heart without angina pectoris   6. Deep vein thrombosis (DVT) of distal vein of lower extremity, unspecified chronicity, unspecified laterality (HCC)   7. Hyperlipidemia, unspecified hyperlipidemia type     PLAN:    In order of problems listed above:  Severe bioprosthetic MR s/p transseptal transcatheter valve in ring TMVR (26mm Sapien valve) with LAMPOON and mitral valve plug at [facility] medical center on (12/19/23), S/p TAVR in aortic position (2020): -- Echo today showed EF 65%, normally functioning mitral valve in ring with a mean gradient of 8.7 mmHg and no MR as well as normally functioning TAVR with a mean gradient of 10.4 and no PVL. -- NYHA class I -- SBE discussed. He has amoxicillin .  -- Continue Eliquis  5mg  BID. -- They will do their one  year follow up with [facility].   HFpEF: -- Appears euvolemic, but has worsening LE edema.  -- Increase Lasix  from 20mg  to 40mg  daily. -- Continue spironolactone  12.5mg  daily.  -- BMET in 2 weeks.   HTN: -- BP well controlled. -- Continue Lopressor  50mg  BID and spiro 12.5mg  daily. -- As above, increase Lasix  from 20mg  to 40mg  daily. -- Losartan  held at discharge. BP under good control, so will continue him off this.   Persistent atrial fibrillation: -- Continue Lopressor  50mg  BID. -- Continue Cardizem  30mg  PRN for palpitations.  -- Continue Eliquis  5mg  BID.  CAD: -- No aspirin  given OAC. -- Continue Lipitor 20mg  daily.   Prior DVT: -- Continue Eliquis  5mg  BID.   HLD: -- Okay to resume Lipitor and Zetia  (recent admission for obstructive hyperbilirubinemia and elevated LFTs now normalized).    Medication Adjustments/Labs and Tests Ordered: Current medicines are reviewed at length with the patient today.  Concerns regarding medicines are outlined above.  Orders Placed This Encounter  Procedures   Basic Metabolic Panel (BMET)   Meds ordered this encounter  Medications   atorvastatin  (LIPITOR) 20 MG tablet    Sig: Take 1 tablet (20 mg total) by mouth daily.   ezetimibe  (ZETIA ) 10 MG tablet    Sig: Take 1 tablet (10 mg total) by mouth daily.   furosemide  (LASIX ) 20 MG tablet    Sig: Take 2 tablets (40 mg total) by mouth daily.    Dispense:  90 tablet    Refill:  3   diltiazem  (CARDIZEM ) 30 MG tablet    Sig: Take 1 tablet (30 mg total) by mouth as needed. Take as needed for palpitations    Patient Instructions  Medication Instructions:  Your physician has recommended you make the following change in your medication: Restart Atorvastatin  20mg  daily.  Restart Ezetimibe  10mg  daily. Increase Lasix  to 40mg  daily (can take 2 tablets of 20mg ).   Do not restart Losartan .  *If you need a refill on your cardiac medications before your next  appointment, please call your  pharmacy*  Lab Work: 02/13/24 BMET If you have labs (blood work) drawn today and your tests are completely normal, you will receive your results only by: MyChart Message (if you have MyChart) OR A paper copy in the mail If you have any lab test that is abnormal or we need to change your treatment, we will call you to review the results.  Follow-Up: At [facility], you and your health needs are our priority.  As part of our continuing mission to provide you with exceptional heart care, our providers are all part of one team.  This team includes your primary Cardiologist (physician) and Advanced Practice Providers or APPs (Physician Assistants and Nurse Practitioners) who all work together to provide you with the care you need, when you need it.  Your next appointment:   As scheduled on 04/06/24 at 10:40AM  Provider:   Ozell Fell, MD    We recommend signing up for the patient portal called MyChart.  Sign up information is provided on this After Visit Summary.  MyChart is used to connect with patients for Virtual Visits (Telemedicine).  Patients are able to view lab/test results, encounter notes, upcoming appointments, etc.  Non-urgent messages can be sent to your provider as well.   To learn more about what you can do with MyChart, go to [email or web address].         Signed, Lamarr Hummer, PA-C  01/31/2024 5:07 PM    [facility]

## 2024-01-30 NOTE — Patient Instructions (Signed)
 Medication Instructions:  Your physician has recommended you make the following change in your medication: Restart Atorvastatin  20mg  daily.  Restart Ezetimibe  10mg  daily. Increase Lasix  to 40mg  daily (can take 2 tablets of 20mg ).   Do not restart Losartan .  *If you need a refill on your cardiac medications before your next appointment, please call your pharmacy*  Lab Work: 02/13/24 BMET If you have labs (blood work) drawn today and your tests are completely normal, you will receive your results only by: MyChart Message (if you have MyChart) OR A paper copy in the mail If you have any lab test that is abnormal or we need to change your treatment, we will call you to review the results.  Follow-Up: At [facility], you and your health needs are our priority.  As part of our continuing mission to provide you with exceptional heart care, our providers are all part of one team.  This team includes your primary Cardiologist (physician) and Advanced Practice Providers or APPs (Physician Assistants and Nurse Practitioners) who all work together to provide you with the care you need, when you need it.  Your next appointment:   As scheduled on 04/06/24 at 10:40AM  Provider:   Ozell Fell, MD    We recommend signing up for the patient portal called MyChart.  Sign up information is provided on this After Visit Summary.  MyChart is used to connect with patients for Virtual Visits (Telemedicine).  Patients are able to view lab/test results, encounter notes, upcoming appointments, etc.  Non-urgent messages can be sent to your provider as well.   To learn more about what you can do with MyChart, go to [email or web address].

## 2024-01-31 ENCOUNTER — Other Ambulatory Visit: Payer: Self-pay | Admitting: Nurse Practitioner

## 2024-01-31 ENCOUNTER — Ambulatory Visit: Payer: Self-pay | Admitting: Physician Assistant

## 2024-01-31 DIAGNOSIS — D649 Anemia, unspecified: Secondary | ICD-10-CM

## 2024-01-31 DIAGNOSIS — D696 Thrombocytopenia, unspecified: Secondary | ICD-10-CM

## 2024-01-31 NOTE — Progress Notes (Unsigned)
 New Hematology/Oncology Consult   Requesting MD: Dr. Alfrieda Pillar  [phone]      Reason for Consult: Anemia, thrombocytopenia  HPI: Eric [name] is a 89 year old man referred for evaluation of anemia, thrombocytopenia.  He was hospitalized 06/29/2023 through 07/02/2023 with heart failure, atrial fibrillation.  CBC was normal during that hospitalization, bilirubin was elevated in the 4-5 range.    On 12/19/2023 he underwent antegrade LAMPOON and transseptal valve and ring TMVR and PLV closure from dehisced MV ring.  CBC at that time returned with a hemoglobin of 13, white count of 4.5, platelet count 130,000.    On 01/08/2024 he underwent an abdominal ultrasound due to jaundice and hyperbilirubinemia-there was no evidence of acute cholecystitis; dilation of the common bile duct without obstructing lesion was identified.  He underwent an MRCP on 01/10/2024 which showed marked dilation of the common bile duct with mild prominence of the central intrahepatic biliary tree; question of a soft tissue mass along the duodenal wall and distal common duct.  He was subsequently hospitalized.  CBC showed hemoglobin 10.9, white count 4.7, platelet count 130,000, bilirubin 8.1, AST 104, ALT 89, LDH 890.  He underwent an ERCP on 01/13/2024-no obvious bulb or second portion of the duodenum mass was seen; deep selective cannulation was readily obtained and an obvious short distal stricture was confirmed.  A temporary plastic biliary stent was placed into the common bile duct.  Distal common bile duct brushing showed atypical cells.  He was discharged home 01/16/2024 at which time the hemoglobin was 8.9, white count 6.5, platelet count 80,000, total bilirubin 5.1, AST and ALT normal.  Hepatic function panel on 01/15/2024 showed a total bilirubin of 6.1 (0.7 direct,  5.4 indirect).  He saw Dr. Rosalie in follow-up 01/29/2024.  They discussed the possibility of an endoscopic ultrasound, stent exchange, repeat ERCP in the future.   Labs showed hemoglobin 8.9, MCV 104, white count 3.4, platelet count 153,000.  We reviewed prior bilirubin levels in the EMR.  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     Past Medical History:  Diagnosis Date   Atrial flutter, paroxysmal (HCC)    a. dx 04-02-2014, s/p  successful cardioversion 04-06-2014.   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 05/2007 CABG x 1: s/p SVG-OM;  b. 10/2010 Ex MV: EF 72%, inf attenuation w/o ischemia, brief run of PAT with exercise. To   Stroke [facility])    Thrombocytopenia (HCC)    Thyroid  goiter 2013   nodular  Wears hearing aid    BILATERAL   Wears partial dentures    UPPER     Past Surgical History:  Procedure Laterality Date   AORTIC VALVE REPLACEMENT (AVR)/CORONARY ARTERY BYPASS GRAFTING (CABG)  06-19-2007  dr lucas   SVG to OM1 ;   AVR w/ #23 Mitralflow pericardial and ligation left atrial appendage;  MV repair w/ #28 Sorin 3-D memo Ring Annuloplasty with repair ruptured chordar of P-2 flail segments   CARDIAC CATHETERIZATION  05-12-2007  dr malva   single vessel 30% ostial LAD,  80% LCx;  severe to  critial AS,  moderate MR,  normal LVF, ef 70%,  normal right heart pressures and cardiac outputs,  mild elevated LV end-diastolic pressure     CARDIOVASCULAR STRESS TEST  11-02-2010  dr dann   normal nuclear study w/ no ischemia or infarct/scar/  normal LV function and wall motion , ef 72%   CARDIOVERSION N/A 04/06/2014   Procedure: CARDIOVERSION;  Surgeon: Eric VEAR Moose, MD;  Location: [facility] ENDOSCOPY;  Service: Cardiovascular;  Laterality: N/A;   successful   CATARACT EXTRACTION W/ INTRAOCULAR LENS  IMPLANT, BILATERAL  2012  approx   CORONARY ARTERY BYPASS GRAFT     ERCP N/A 01/13/2024   Procedure: ERCP, WITH INTERVENTION IF INDICATED;  Surgeon: Rosalie Kitchens, MD;  Location: [facility] ENDOSCOPY;  Service: Gastroenterology;  Laterality: N/A;   PROSTATECTOMY     RETROPUBIC RADICAL PROSTATECTOMY  06/ 2000  in [place], [place]   RIGHT/LEFT HEART CATH AND CORONARY/GRAFT ANGIOGRAPHY N/A 10/29/2018   Procedure: RIGHT/LEFT HEART CATH AND CORONARY/GRAFT ANGIOGRAPHY;  Surgeon: Eric Sharper, MD;  Location: [facility] INVASIVE CV LAB;  Service: Cardiovascular;  Laterality: N/A;   RIGHT/LEFT HEART CATH AND CORONARY/GRAFT ANGIOGRAPHY N/A 10/04/2023   Procedure: RIGHT/LEFT HEART CATH AND CORONARY/GRAFT ANGIOGRAPHY;  Surgeon: Eric Sharper, MD;  Location: [facility] INVASIVE CV LAB;  Service: Cardiovascular;  Laterality: N/A;   ROTATOR CUFF REPAIR Left 09/1998   TEE WITHOUT CARDIOVERSION N/A 04/06/2014   Procedure: TRANSESOPHAGEAL ECHOCARDIOGRAM (TEE);  Surgeon: Eric VEAR Moose, MD;  Location: [facility] ENDOSCOPY;  Service: Cardiovascular;  Laterality: N/A;  mild focal basa LVH of the septum, ef 50-55%, s/p AV annuloplasty ring, elongated chordae, thickened leaflets, mild to mod. MR, multiple small jets/arotic bioprosthetic valve sits well in position, mild AI/ thickened TV w/ mild reurg./mild LA   TEE WITHOUT CARDIOVERSION N/A 10/22/2018   Procedure: TRANSESOPHAGEAL ECHOCARDIOGRAM (TEE);  Surgeon: Eric Oneil BROCKS, MD;  Location: [facility]  ENDOSCOPY;  Service: Cardiovascular;  Laterality: N/A;   TEE WITHOUT CARDIOVERSION N/A 11/25/2018   Procedure: TRANSESOPHAGEAL ECHOCARDIOGRAM (TEE);  Surgeon: Eric Sharper, MD;  Location: [facility] OR;  Service: Open Heart Surgery;  Laterality: N/A;   TRANSCATHETER AORTIC VALVE REPLACEMENT, TRANSFEMORAL N/A 11/25/2018   Procedure: TRANSCATHETER AORTIC VALVE REPLACEMENT, TRANSFEMORAL;  Surgeon: Eric Sharper, MD;  Location: [facility] OR;  Service: Open Heart Surgery;  Laterality: N/A;   TRANSESOPHAGEAL ECHOCARDIOGRAM (CATH LAB) N/A 10/04/2023   Procedure: TRANSESOPHAGEAL ECHOCARDIOGRAM;  Surgeon: Eric Stanly LABOR, MD;  Location: [facility] INVASIVE CV LAB;  Service: Cardiovascular;  Laterality: N/A;   TRANSTHORACIC ECHOCARDIOGRAM  03-23-2016   dr dann   EF 55-60%, reduced contribution atrial contraction to ventricular filling, due to increased ventricular diastolic pressure or atrial contratile dysfunction/  bioprosthetic AV w/ mod. regurg. (valve area 1.97cm^2)/ mild dilated ascending aorta/ mild thicken MV normal function annular ring prosthesis/severe LAE & mod. to sev.RAE/ PASP 61mmHg/ mild TR/ ventricle septal motion show paradox   TRANSURETHRAL RESECTION OF BLADDER TUMOR N/A 02/13/2016  Procedure: TRANSURETHRAL RESECTION OF BLADDER TUMOR (TURBT);  Surgeon: Eric Lynwood Napoleon, MD;  Location: [facility];  Service: Urology;  Laterality: N/A;   TRANSURETHRAL RESECTION OF BLADDER TUMOR N/A 09/04/2016   Procedure: TRANSURETHRAL RESECTION OF BLADDER TUMOR (TURBT);  Surgeon: Eric Lynwood Napoleon, MD;  Location: [facility];  Service: Urology;  Laterality: N/A;     Current Outpatient Medications:    apixaban  (ELIQUIS ) 5 MG TABS tablet, TAKE 1 TABLET BY MOUTH TWICE  DAILY, Disp: 180 tablet, Rfl: 2   Ascorbic Acid (VITAMIN C PO), Take 500 mg by mouth in the morning. Gummies 250 mg each, Disp: , Rfl:    atorvastatin  (LIPITOR) 20 MG tablet, Take 1 tablet (20 mg total) by mouth  daily., Disp: , Rfl:    diltiazem  (CARDIZEM ) 30 MG tablet, Take 1 tablet (30 mg total) by mouth as needed. Take as needed for palpitations, Disp: , Rfl:    Eyelid Cleansers (OCUSOFT EYELID CLEANSING) PADS, Place 1 application into the left eye in the morning., Disp: , Rfl:    ezetimibe  (ZETIA ) 10 MG tablet, Take 1 tablet (10 mg total) by mouth daily., Disp: , Rfl:    furosemide  (LASIX ) 20 MG tablet, Take 2 tablets (40 mg total) by mouth daily., Disp: 90 tablet, Rfl: 3   metoprolol  tartrate (LOPRESSOR ) 25 MG tablet, Take 2 tablets (50 mg total) by mouth 2 (two) times daily., Disp: 180 tablet, Rfl: 1   nitroGLYCERIN  (NITROSTAT ) 0.4 MG SL tablet, Place 1 tablet (0.4 mg total) under the tongue every 5 (five) minutes as needed., Disp: 25 tablet, Rfl: 3   spironolactone  (ALDACTONE ) 25 MG tablet, Take 0.5 tablets (12.5 mg total) by mouth in the morning., Disp: 45 tablet, Rfl: 1   traZODone (DESYREL) 50 MG tablet, Take 75 mg by mouth at bedtime., Disp: , Rfl:    amoxicillin  (AMOXIL ) 500 MG tablet, Take 4 tablets (2,000 mg total) by mouth as directed. Take 1 hour prior to dental work, including cleanings. (Patient not taking: Reported on 02/03/2024), Disp: 4 tablet, Rfl: 6:    No Known Allergies:  FH: Father had hairy cell leukemia.  SOCIAL HISTORY: He lives in [place].  He is married.  He has 2 children.  He is retired from a Insurance account manager position.  No tobacco use.  He has an occasional glass of wine, at the most 1 time per week.  He has never had a blood transfusion.  Review of Systems: In general he feels weak.  He has mild dyspnea on exertion.  No fevers or sweats.  No chills.  He feels cold all the time.  No bleeding.  No recent infections.  Appetite varies.  He has lost weight.  He walks with a walker.  He notes dyspnea on exertion.  No cough.  No dysphagia.  No nausea or vomiting.  For the past 3 days he has noted the urge to have a bowel movement but is not passing formed stool.  He does note  stool on the toilet tissue when he wipes.  No urinary symptoms.  No numbness or tingling in the hands or feet.  Physical Exam:  Blood pressure 98/66, pulse 63, temperature 97.6 F (36.4 C), temperature source Temporal, resp. rate 18, height 6' (1.829 m), weight 167 lb (75.8 kg), SpO2 98%.  HEENT: Mild scleral icterus.  No thrush or ulcers. Lungs: Lungs clear bilaterally. Cardiac: Irregular. Abdomen: No hepatosplenomegaly. Vascular: 1+ lower leg edema bilaterally. Lymph nodes: No palpable cervical, supraclavicular, axillary or  inguinal lymph nodes. Neurologic: Alert and oriented. Skin: Mild jaundice.  LABS:   Recent Labs    02/03/24 1410  WBC 3.6*  HGB 9.5*  HCT 30.3*  PLT 145*     Recent Labs    02/03/24 1410  NA 140  K 5.0  CL 104  CO2 24  GLUCOSE 101*  BUN 19  CREATININE 1.20  CALCIUM  10.4*  Peripheral blood smear-mild increase in polychromasia, numerous acanthocytes/ schistocytes/burr cells, rare target cell, no nucleated red blood cells; white cell morphology unremarkable; platelets appear mildly decreased, majority of platelets appear small.    RADIOLOGY:  ECHOCARDIOGRAM COMPLETE Result Date: 01/30/2024    ECHOCARDIOGRAM REPORT   Patient Name:   Eric [name] Date of Exam: 01/30/2024 Medical Rec #:  [id number]       Height:       72.0 in Accession #:    [id number]      Weight:       174.0 lb Date of Birth:  [birth year]       BSA:          2.008 m Patient Age:    89 years        BP:           104/68 mmHg Patient Gender: M               HR:           74 bpm. Exam Location:  [address] Procedure: 2D Echo, Cardiac Doppler and Color Doppler (Both Spectral and Color            Flow Doppler were utilized during procedure). Indications:    Z95.2 S/p TMVR  History:        Patient has prior history of Echocardiogram examinations, most                 recent 09/02/2023. S/p TMVR (26mm Sapien 12/19/23), Dilated aortic                 root; S/p TAVR (23mm Sapien).                   Mitral Valve: 26 mm bioprosthetic valve valve is present in the                 mitral position. Procedure Date: 12/19/2023.  Sonographer:    Eric [name] RDCS Referring Phys: [id number] Eric [name] IMPRESSIONS  1. 26 mm S3 Edwards Sapien Vaive in [place]. Emax 2.4 m/s, MG 8.7 mmHG @ 57 bpm, EOA 1.82 cm2. Normal prosthesis. The mitral valve has been repaired/replaced. No evidence of mitral valve regurgitation. There is a 26 mm bioprosthetic valve present in the mitral position. Procedure Date: 12/19/2023. Echo findings are consistent with normal structure and function of the mitral valve prosthesis.  2. Left ventricular ejection fraction, by estimation, is 65 to 70%. The left ventricle has normal function. The left ventricle has no regional wall motion abnormalities. There is mild asymmetric left ventricular hypertrophy of the basal-septal segment. Left ventricular diastolic function could not be evaluated.  3. Right ventricular systolic function is mildly reduced. The right ventricular size is moderately enlarged. There is normal pulmonary artery systolic pressure. The estimated right ventricular systolic pressure is 34.6 mmHg.  4. Left atrial size was severely dilated.  5. Right atrial size was moderately dilated.  6. Tricuspid valve regurgitation is moderate.  7. 23 mm S3 Valve in Valve TAVR. The aortic valve has been repaired/replaced. Aortic valve  regurgitation is trivial. Procedure Date: 11/25/2018. Echo findings are consistent with normal structure and function of the aortic valve prosthesis. Aortic valve area, by VTI measures 2.43 cm. Aortic valve mean gradient measures 10.4 mmHg. Aortic valve Vmax measures 2.39 m/s.  8. The inferior vena cava is normal in size with greater than 50% respiratory variability, suggesting right atrial pressure of 3 mmHg. Comparison(s): Changes from prior study are noted. S/p TMVR. FINDINGS  Left Ventricle: Left ventricular ejection fraction, by estimation, is 65 to 70%. The  left ventricle has normal function. The left ventricle has no regional wall motion abnormalities. The left ventricular internal cavity size was normal in size. There is  mild asymmetric left ventricular hypertrophy of the basal-septal segment. Abnormal (paradoxical) septal motion consistent with post-operative status. Left ventricular diastolic function could not be evaluated due to mitral valve replacement. Left ventricular diastolic function could not be evaluated. Right Ventricle: The right ventricular size is moderately enlarged. No increase in right ventricular wall thickness. Right ventricular systolic function is mildly reduced. There is normal pulmonary artery systolic pressure. The tricuspid regurgitant velocity is 2.81 m/s, and with an assumed right atrial pressure of 3 mmHg, the estimated right ventricular systolic pressure is 34.6 mmHg. Left Atrium: Left atrial size was severely dilated. Right Atrium: Right atrial size was moderately dilated. Pericardium: There is no evidence of pericardial effusion. Mitral Valve: 26 mm S3 Edwards Sapien Vaive in [place]. Emax 2.4 m/s, MG 8.7 mmHG @ 57 bpm, EOA 1.82 cm2. Normal prosthesis. The mitral valve has been repaired/replaced. No evidence of mitral valve regurgitation. There is a 26 mm bioprosthetic valve present  in the mitral position. Procedure Date: 12/19/2023. Echo findings are consistent with normal structure and function of the mitral valve prosthesis. MV peak gradient, 22.7 mmHg. The mean mitral valve gradient is 8.7 mmHg with average heart rate of 57 bpm. Tricuspid Valve: The tricuspid valve is grossly normal. Tricuspid valve regurgitation is moderate . No evidence of tricuspid stenosis. Aortic Valve: 23 mm S3 Valve in Valve TAVR. The aortic valve has been repaired/replaced. Aortic valve regurgitation is trivial. Aortic valve mean gradient measures 10.4 mmHg. Aortic valve peak gradient measures 22.8 mmHg. Aortic valve area, by VTI measures 2.43 cm. There is  a 23 mm Sapien prosthetic, stented (TAVR) valve present in the aortic position. Echo findings are consistent with normal structure and function of the aortic valve prosthesis. Pulmonic Valve: The pulmonic valve was grossly normal. Pulmonic valve regurgitation is trivial. No evidence of pulmonic stenosis. Aorta: The aortic root and ascending aorta are structurally normal, with no evidence of dilitation. Venous: The inferior vena cava is normal in size with greater than 50% respiratory variability, suggesting right atrial pressure of 3 mmHg. IAS/Shunts: The atrial septum is grossly normal.  LEFT VENTRICLE PLAX 2D LVIDd:         4.40 cm   Diastology LVIDs:         2.90 cm   LV e' medial:    5.30 cm/s LV PW:         1.10 cm   LV E/e' medial:  41.2 LV IVS:        1.30 cm   LV e' lateral:   9.90 cm/s LVOT diam:     2.21 cm   LV E/e' lateral: 22.1 LV SV:         111 LV SV Index:   55 LVOT Area:     3.84 cm  RIGHT VENTRICLE  IVC RVSP:           34.6 mmHg  IVC diam: 1.50 cm LEFT ATRIUM              Index        RIGHT ATRIUM           Index LA diam:        4.70 cm  2.34 cm/m   RA Pressure: 3.00 mmHg LA Vol (A2C):   70.8 ml  35.26 ml/m  RA Area:     22.70 cm LA Vol (A4C):   100.0 ml 49.80 ml/m  RA Volume:   69.60 ml  34.66 ml/m LA Biplane Vol: 86.9 ml  43.27 ml/m  AORTIC VALVE AV Area (Vmax):    2.12 cm AV Area (Vmean):   2.36 cm AV Area (VTI):     2.43 cm AV Vmax:           238.80 cm/s AV Vmean:          145.800 cm/s AV VTI:            0.458 m AV Peak Grad:      22.8 mmHg AV Mean Grad:      10.4 mmHg LVOT Vmax:         132.20 cm/s LVOT Vmean:        89.640 cm/s LVOT VTI:          0.290 m LVOT/AV VTI ratio: 0.63  AORTA Ao Asc diam: 3.90 cm MITRAL VALVE                TRICUSPID VALVE MV Area (PHT): 1.61 cm     TR Peak grad:   31.6 mmHg MV Area VTI:   1.82 cm     TR Vmax:        281.00 cm/s MV Peak grad:  22.7 mmHg    Estimated RAP:  3.00 mmHg MV Mean grad:  8.7 mmHg     RVSP:           34.6 mmHg MV Vmax:        2.38 m/s MV Vmean:      131.0 cm/s   SHUNTS MV VTI:        0.61 m       Systemic VTI:  0.29 m MV Decel Time: 472 msec     Systemic Diam: 2.21 cm MV E velocity: 218.67 cm/s Eric Decent MD Electronically signed by Eric Decent MD Signature Date/Time: 01/30/2024/6:09:07 PM    Final    DG ERCP Result Date: 01/14/2024 CLINICAL DATA:  Abnormal MRCP jaundice. EXAM: ERCP 6 fluoroscopic intra procedural images TECHNIQUE: Multiple spot images obtained with the fluoroscopic device and submitted for interpretation post-procedure. FLUOROSCOPY: Radiation Exposure Index (as provided by the fluoroscopic device): 35.43 mGy Kerma COMPARISON:  None Available. FINDINGS: Fluoroscopic intra procedural images of the right upper quadrant were performed and submitted for interpretation. Flexible endoscopy device is present with guidewire in the common bile duct. Common bile duct is markedly dilated without evidence of intraductal debris. Smooth tapering to the ampulla. Balloon sweep performed. No extravasation of contrast. IMPRESSION: Ductal dilatation without filling defect. These images were submitted for radiologic interpretation only. Please see the procedural report for the amount of contrast and the fluoroscopy time utilized. Electronically Signed   By: Eric [name]   On: 01/14/2024 15:59   MR ABDOMEN MRCP W WO CONTAST Result Date: 01/10/2024 CLINICAL DATA:  Dilated bile ducts. EXAM: MRI ABDOMEN WITHOUT AND WITH CONTRAST (  INCLUDING MRCP) TECHNIQUE: Multiplanar multisequence MR imaging of the abdomen was performed both before and after the administration of intravenous contrast. Heavily T2-weighted images of the biliary and pancreatic ducts were obtained, and three-dimensional MRCP images were rendered by post processing. CONTRAST:  7.5mL GADAVIST  GADOBUTROL  1 MMOL/ML IV SOLN COMPARISON:  Multiple priors including CTA chest Oct 15, 2022 and CT abdomen pelvis August 23, 2010. FINDINGS: Lower chest: Trace right pleural  effusion. Hepatobiliary: Diffuse hepatic steatosis. No suspicious hepatic lesion. Distended gallbladder.  No evidence of acute cholecystitis. Marked dilation of the common bile duct with mild prominence of the central intrahepatic biliary tree. Common duct measures up to 2.1 cm in diameter. No discrete choledocholithiasis. Question of a soft tissue mass along the duodenal wall and distal common duct on image 28/12 with hypoenhancement in this area on postcontrast image 73/1001. DWI and ADC sequences are significantly limited by motion. Pancreas: No pancreatic ductal dilation or evidence of acute inflammation. Spleen:  No splenomegaly or focal splenic lesion. Adrenals/Urinary Tract: No suspicious adrenal nodule/mass. No hydronephrosis. Stomach/Bowel: Visualized portions within the abdomen are unremarkable. Vascular/Lymphatic: No pathologically enlarged lymph nodes identified. No abdominal aortic aneurysm demonstrated. Other:  No significant abdominal free fluid. Musculoskeletal: No suspicious bone lesions identified. IMPRESSION: Motion degraded examination limits sensitivity and specificity. Within this context: 1. Marked dilation of the common bile duct with mild prominence of the central intrahepatic biliary tree. No discrete choledocholithiasis. Question of a soft tissue mass along the duodenal wall and distal common duct with hypoenhancement in this area on postcontrast sequence. DWI and ADC sequences are significantly limited by motion. Recommend further evaluation with ERCP. 2. Gallbladder is distended without evidence of acute cholecystitis. 3. Diffuse hepatic steatosis. 4. Trace right pleural effusion. Electronically Signed   By: Eric [name] M.D.   On: 01/10/2024 14:09   MR 3D Recon At Scanner Result Date: 01/10/2024 CLINICAL DATA:  Dilated bile ducts. EXAM: MRI ABDOMEN WITHOUT AND WITH CONTRAST (INCLUDING MRCP) TECHNIQUE: Multiplanar multisequence MR imaging of the abdomen was performed both before and  after the administration of intravenous contrast. Heavily T2-weighted images of the biliary and pancreatic ducts were obtained, and three-dimensional MRCP images were rendered by post processing. CONTRAST:  7.5mL GADAVIST  GADOBUTROL  1 MMOL/ML IV SOLN COMPARISON:  Multiple priors including CTA chest Oct 15, 2022 and CT abdomen pelvis August 23, 2010. FINDINGS: Lower chest: Trace right pleural effusion. Hepatobiliary: Diffuse hepatic steatosis. No suspicious hepatic lesion. Distended gallbladder.  No evidence of acute cholecystitis. Marked dilation of the common bile duct with mild prominence of the central intrahepatic biliary tree. Common duct measures up to 2.1 cm in diameter. No discrete choledocholithiasis. Question of a soft tissue mass along the duodenal wall and distal common duct on image 28/12 with hypoenhancement in this area on postcontrast image 73/1001. DWI and ADC sequences are significantly limited by motion. Pancreas: No pancreatic ductal dilation or evidence of acute inflammation. Spleen:  No splenomegaly or focal splenic lesion. Adrenals/Urinary Tract: No suspicious adrenal nodule/mass. No hydronephrosis. Stomach/Bowel: Visualized portions within the abdomen are unremarkable. Vascular/Lymphatic: No pathologically enlarged lymph nodes identified. No abdominal aortic aneurysm demonstrated. Other:  No significant abdominal free fluid. Musculoskeletal: No suspicious bone lesions identified. IMPRESSION: Motion degraded examination limits sensitivity and specificity. Within this context: 1. Marked dilation of the common bile duct with mild prominence of the central intrahepatic biliary tree. No discrete choledocholithiasis. Question of a soft tissue mass along the duodenal wall and distal common duct with hypoenhancement in this area on postcontrast  sequence. DWI and ADC sequences are significantly limited by motion. Recommend further evaluation with ERCP. 2. Gallbladder is distended without evidence of  acute cholecystitis. 3. Diffuse hepatic steatosis. 4. Trace right pleural effusion. Electronically Signed   By: Eric [name] M.D.   On: 01/10/2024 14:09   US  Abdomen Limited RUQ (LIVER/GB) Result Date: 01/08/2024 CLINICAL DATA:  Jaundice.  Hyperbilirubinemia EXAM: ULTRASOUND ABDOMEN LIMITED RIGHT UPPER QUADRANT COMPARISON:  None Available. FINDINGS: Gallbladder: No gallstones or wall thickening visualized. No sonographic Murphy sign noted by sonographer. Common bile duct: Diameter: Dilated to 20 mm. Liver: No focal lesion identified. Within normal limits in parenchymal echogenicity. No intrahepatic duct dilatation identified. Portal vein is patent on color Doppler imaging with normal direction of blood flow towards the liver. Other: None. IMPRESSION: 1. No evidence acute cholecystitis. 2. Dilatation of the common bile duct without obstructing lesion identified. In patient with elevated bilirubin, consider MRCP for further evaluation of the common bile duct. Electronically Signed   By: Eric [name] M.D.   On: 01/08/2024 16:14    Assessment and Plan:   Macrocytic anemia Thrombocytopenia Chronic hyperbilirubinemia Biliary obstruction  01/08/2024 abdominal ultrasound -there was no evidence of acute cholecystitis; dilation of the common bile duct without obstructing lesion was identified.   MRCP 01/10/2024-marked dilation of the common bile duct with mild prominence of the central intrahepatic biliary tree; question of a soft tissue mass along the duodenal wall and distal common duct. ERCP  01/13/2024-no obvious bulb or second portion of the duodenum mass was seen.  Deep selective cannulization was readily obtained and an obvious short distal stricture was confirmed.  A temporary plastic biliary stent was placed into the common bile duct.  Distal common bile duct brushing showed atypical cells. 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 Enberg was referred for evaluation of anemia and thrombocytopenia.  Initial labs and the peripheral blood smear are consistent with hemolysis.  We discussed the possibility of heart valve hemolysis, intramedullary hemolysis, an inherited type hemolysis.  He will begin folic acid  1 mg daily.  We will follow-up on the outstanding labs from today.  The platelet count is higher today, just outside of normal range.  He has chronic hyperbilirubinemia dating to at least 2009.  His initial heart valve surgery was in 2009.  We will contact Dr. Garen office for CBCs and chemistry panels prior to 2009.  Mr. Statz will return for lab and follow-up in 1 week.  We are available to see him sooner if needed.  Patient seen with Dr. Cloretta.    Eric Ned, NP 02/03/2024, 3:42 PM  This was a shared visit with Eric [name].  Mr. Obryan was interviewed and examined.  I reviewed the peripheral blood smear.  He is referred for hematology and oncology evaluation after presenting with hyperbilirubinemia and a common bile duct stricture last month.  He has chronic hyperbilirubinemia dating back to at least 2009. He has developed anemia following the most recent heart valve procedure.  He has a history of chronic mild intermittent thrombocytopenia.  He may have a bile duct or pancreas cancer, but this does not explain the entire clinical picture.  He has a longstanding history of hyperbilirubinemia and the anemia/red cell morphology are  not explained by malignancy.  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  The differential diagnosis includes a bone marrow process such as myelodysplasia/myelofibrosis with intramedullary hemolysis, and  malignancy involving the bone marrow.  We will obtain additional evaluation for hemolysis.  I will review the case with Dr. Wonda with regard to the possibility of heart valve hemolysis.  We will request laboratory studies predating the initial 2009 heart valve surgery.  I was present for greater than 50% of today's visit.  I performed medical decision making.  Eric Hof, MD

## 2024-02-03 ENCOUNTER — Encounter: Payer: Self-pay | Admitting: Nurse Practitioner

## 2024-02-03 ENCOUNTER — Inpatient Hospital Stay: Attending: Nurse Practitioner | Admitting: Nurse Practitioner

## 2024-02-03 ENCOUNTER — Inpatient Hospital Stay

## 2024-02-03 ENCOUNTER — Ambulatory Visit: Admitting: Nurse Practitioner

## 2024-02-03 VITALS — BP 98/66 | HR 63 | Temp 97.6°F | Resp 18 | Ht 72.0 in | Wt 167.0 lb

## 2024-02-03 DIAGNOSIS — I4891 Unspecified atrial fibrillation: Secondary | ICD-10-CM

## 2024-02-03 DIAGNOSIS — D649 Anemia, unspecified: Secondary | ICD-10-CM | POA: Insufficient documentation

## 2024-02-03 DIAGNOSIS — I2510 Atherosclerotic heart disease of native coronary artery without angina pectoris: Secondary | ICD-10-CM

## 2024-02-03 DIAGNOSIS — D696 Thrombocytopenia, unspecified: Secondary | ICD-10-CM | POA: Insufficient documentation

## 2024-02-03 DIAGNOSIS — Z8551 Personal history of malignant neoplasm of bladder: Secondary | ICD-10-CM

## 2024-02-03 DIAGNOSIS — I10 Essential (primary) hypertension: Secondary | ICD-10-CM

## 2024-02-03 DIAGNOSIS — Z8546 Personal history of malignant neoplasm of prostate: Secondary | ICD-10-CM

## 2024-02-03 DIAGNOSIS — Z7901 Long term (current) use of anticoagulants: Secondary | ICD-10-CM

## 2024-02-03 LAB — RETIC PANEL
Immature Retic Fract: 18.1 % — ABNORMAL HIGH (ref 2.3–15.9)
RBC.: 2.96 MIL/uL — ABNORMAL LOW (ref 4.22–5.81)
Retic Count, Absolute: 110.4 K/uL (ref 19.0–186.0)
Retic Ct Pct: 3.7 % — ABNORMAL HIGH (ref 0.4–3.1)
Reticulocyte Hemoglobin: 29.4 pg (ref >27.9)

## 2024-02-03 LAB — CBC WITH DIFFERENTIAL (CANCER CENTER ONLY)
Abs Immature Granulocytes: 0.02 K/uL (ref 0.00–0.07)
Basophils Absolute: 0 K/uL (ref 0.0–0.1)
Basophils Relative: 1 %
Eosinophils Absolute: 0 K/uL (ref 0.0–0.5)
Eosinophils Relative: 1 %
HCT: 30.3 % — ABNORMAL LOW (ref 39.0–52.0)
Hemoglobin: 9.5 g/dL — ABNORMAL LOW (ref 13.0–17.0)
Immature Granulocytes: 1 %
Lymphocytes Relative: 31 %
Lymphs Abs: 1.1 K/uL (ref 0.7–4.0)
MCH: 32.6 pg (ref 26.0–34.0)
MCHC: 31.4 g/dL (ref 30.0–36.0)
MCV: 104.1 fL — ABNORMAL HIGH (ref 80.0–100.0)
Monocytes Absolute: 0.4 K/uL (ref 0.1–1.0)
Monocytes Relative: 10 %
Neutro Abs: 2 K/uL (ref 1.7–7.7)
Neutrophils Relative %: 56 %
Platelet Count: 145 K/uL — ABNORMAL LOW (ref 150–400)
RBC: 2.91 MIL/uL — ABNORMAL LOW (ref 4.22–5.81)
RDW: 15 % (ref 11.5–15.5)
WBC Count: 3.6 K/uL — ABNORMAL LOW (ref 4.0–10.5)
nRBC: 0 % (ref 0.0–0.2)

## 2024-02-03 LAB — SAVE SMEAR(SSMR), FOR PROVIDER SLIDE REVIEW

## 2024-02-03 LAB — CMP (CANCER CENTER ONLY)
ALT: 16 U/L (ref 0–44)
AST: 41 U/L (ref 15–41)
Albumin: 4.3 g/dL (ref 3.5–5.0)
Alkaline Phosphatase: 67 U/L (ref 38–126)
Anion gap: 12 (ref 5–15)
BUN: 19 mg/dL (ref 8–23)
CO2: 24 mmol/L (ref 22–32)
Calcium: 10.4 mg/dL — ABNORMAL HIGH (ref 8.9–10.3)
Chloride: 104 mmol/L (ref 98–111)
Creatinine: 1.2 mg/dL (ref 0.61–1.24)
GFR, Estimated: 57 mL/min — ABNORMAL LOW (ref >60)
Glucose, Bld: 101 mg/dL — ABNORMAL HIGH (ref 70–99)
Potassium: 5 mmol/L (ref 3.5–5.1)
Sodium: 140 mmol/L (ref 135–145)
Total Bilirubin: 3.6 mg/dL (ref 0.0–1.2)
Total Protein: 7 g/dL (ref 6.5–8.1)

## 2024-02-03 LAB — BILIRUBIN, FRACTIONATED(TOT/DIR/INDIR)
Bilirubin, Direct: 1.3 mg/dL — ABNORMAL HIGH (ref 0.0–0.2)
Indirect Bilirubin: 2.3 mg/dL — ABNORMAL HIGH (ref 0.3–0.9)
Total Bilirubin: 3.6 mg/dL — ABNORMAL HIGH (ref 0.0–1.2)

## 2024-02-03 LAB — LACTATE DEHYDROGENASE: LDH: 696 U/L — ABNORMAL HIGH (ref 98–192)

## 2024-02-03 MED ORDER — FOLIC ACID 1 MG PO TABS: 1.0000 mg | ORAL_TABLET | Freq: Every day | ORAL | 4 refills | Status: DC

## 2024-02-03 NOTE — Progress Notes (Unsigned)
 CRITICAL VALUE STICKER  CRITICAL VALUE: Total bili 3.6  RECEIVER (on-site recipient of call):Tareka Jhaveri,RN  DATE & TIME NOTIFIED: 02/03/24 @ 1518  MESSENGER (representative from lab):James  MD NOTIFIED: Olam Ned, NP  TIME OF NOTIFICATION: 1519  RESPONSE:

## 2024-02-04 ENCOUNTER — Other Ambulatory Visit: Payer: Self-pay | Admitting: Nurse Practitioner

## 2024-02-04 ENCOUNTER — Telehealth: Payer: Self-pay | Admitting: Nurse Practitioner

## 2024-02-04 DIAGNOSIS — D649 Anemia, unspecified: Secondary | ICD-10-CM

## 2024-02-04 DIAGNOSIS — D696 Thrombocytopenia, unspecified: Secondary | ICD-10-CM

## 2024-02-04 LAB — SAMPLE TO BLOOD BANK

## 2024-02-04 LAB — HAPTOGLOBIN: Haptoglobin: <10 mg/dL — ABNORMAL LOW (ref 38–329)

## 2024-02-04 LAB — DIRECT ANTIGLOBULIN TEST (NOT AT ARMC)
DAT, IgG: NEGATIVE
DAT, complement: NEGATIVE

## 2024-02-04 LAB — IRON AND TIBC
Iron: 80 ug/dL (ref 45–182)
Saturation Ratios: 19 % (ref 17.9–39.5)
TIBC: 412 ug/dL (ref 250–450)
UIBC: 332 ug/dL

## 2024-02-04 LAB — FERRITIN: Ferritin: 159 ng/mL (ref 24–336)

## 2024-02-06 LAB — LACTATE DEHYDROGENASE, ISOENZYMES
LDH 1: 32 % (ref 17–32)
LDH 2: 40 % (ref 25–40)
LDH 3: 19 % (ref 17–27)
LDH 4: 6 % (ref 5–13)
LDH 5: 3 % — ABNORMAL LOW (ref 4–20)
LDH Isoenzymes, Total: 676 IU/L — ABNORMAL HIGH (ref 121–224)

## 2024-02-10 ENCOUNTER — Inpatient Hospital Stay

## 2024-02-10 ENCOUNTER — Encounter: Payer: Self-pay | Admitting: Nurse Practitioner

## 2024-02-10 ENCOUNTER — Inpatient Hospital Stay: Admitting: Nurse Practitioner

## 2024-02-10 ENCOUNTER — Telehealth: Payer: Self-pay | Admitting: Cardiovascular Disease

## 2024-02-10 VITALS — BP 98/61 | HR 77 | Temp 97.8°F | Resp 18 | Ht 72.0 in | Wt 166.9 lb

## 2024-02-10 DIAGNOSIS — D696 Thrombocytopenia, unspecified: Secondary | ICD-10-CM

## 2024-02-10 DIAGNOSIS — D649 Anemia, unspecified: Secondary | ICD-10-CM

## 2024-02-10 DIAGNOSIS — D509 Iron deficiency anemia, unspecified: Secondary | ICD-10-CM

## 2024-02-10 LAB — CMP (CANCER CENTER ONLY)
ALT: 13 U/L (ref 0–44)
AST: 50 U/L — ABNORMAL HIGH (ref 15–41)
Albumin: 4.2 g/dL (ref 3.5–5.0)
Alkaline Phosphatase: 64 U/L (ref 38–126)
Anion gap: 12 (ref 5–15)
BUN: 15 mg/dL (ref 8–23)
CO2: 25 mmol/L (ref 22–32)
Calcium: 9.8 mg/dL (ref 8.9–10.3)
Chloride: 102 mmol/L (ref 98–111)
Creatinine: 1.14 mg/dL (ref 0.61–1.24)
GFR, Estimated: >60 mL/min (ref >60)
Glucose, Bld: 163 mg/dL — ABNORMAL HIGH (ref 70–99)
Potassium: 4 mmol/L (ref 3.5–5.1)
Sodium: 139 mmol/L (ref 135–145)
Total Bilirubin: 3.6 mg/dL (ref 0.0–1.2)
Total Protein: 6.7 g/dL (ref 6.5–8.1)

## 2024-02-10 LAB — CBC WITH DIFFERENTIAL (CANCER CENTER ONLY)
Abs Immature Granulocytes: 0.01 K/uL (ref 0.00–0.07)
Basophils Absolute: 0 K/uL (ref 0.0–0.1)
Basophils Relative: 1 %
Eosinophils Absolute: 0 K/uL (ref 0.0–0.5)
Eosinophils Relative: 1 %
HCT: 32.6 % — ABNORMAL LOW (ref 39.0–52.0)
Hemoglobin: 10 g/dL — ABNORMAL LOW (ref 13.0–17.0)
Immature Granulocytes: 0 %
Lymphocytes Relative: 18 %
Lymphs Abs: 0.9 K/uL (ref 0.7–4.0)
MCH: 32.2 pg (ref 26.0–34.0)
MCHC: 30.7 g/dL (ref 30.0–36.0)
MCV: 104.8 fL — ABNORMAL HIGH (ref 80.0–100.0)
Monocytes Absolute: 0.4 K/uL (ref 0.1–1.0)
Monocytes Relative: 9 %
Neutro Abs: 3.6 K/uL (ref 1.7–7.7)
Neutrophils Relative %: 71 %
Platelet Count: 123 K/uL — ABNORMAL LOW (ref 150–400)
RBC: 3.11 MIL/uL — ABNORMAL LOW (ref 4.22–5.81)
RDW: 14 % (ref 11.5–15.5)
WBC Count: 5 K/uL (ref 4.0–10.5)
nRBC: 0 % (ref 0.0–0.2)

## 2024-02-10 LAB — SAMPLE TO BLOOD BANK

## 2024-02-10 LAB — RETIC PANEL
Immature Retic Fract: 14.6 % (ref 2.3–15.9)
RBC.: 3.1 MIL/uL — ABNORMAL LOW (ref 4.22–5.81)
Retic Count, Absolute: 81.5 K/uL (ref 19.0–186.0)
Retic Ct Pct: 2.6 % (ref 0.4–3.1)
Reticulocyte Hemoglobin: 28 pg (ref >27.9)

## 2024-02-10 LAB — SAVE SMEAR(SSMR), FOR PROVIDER SLIDE REVIEW

## 2024-02-10 LAB — LACTATE DEHYDROGENASE: LDH: 728 U/L — ABNORMAL HIGH (ref 98–192)

## 2024-02-10 NOTE — Progress Notes (Signed)
 [facility] OFFICE PROGRESS NOTE   Diagnosis: Anemia, thrombocytopenia  INTERVAL HISTORY:   Mr. Moradi returns as scheduled.  Continued fatigue but overall feeling better.  Appetite is marginal.  Wife notes weight is stable.  He denies bleeding.  No black stools.  No abdominal pain.  He notes stools are small, in pieces.  Objective:  Vital signs in last 24 hours:  Blood pressure 98/61, pulse 77, temperature 97.8 F (36.6 C), temperature source Temporal, resp. rate 18, height 6' (1.829 m), weight 166 lb 14.4 oz (75.7 kg), SpO2 100%.    HEENT: Mild scleral icterus.   Resp: Lungs clear bilaterally. Cardio: Irregular. GI: No hepatosplenomegaly. Vascular: Trace lateral lower leg/ankle edema. Neuro: Alert and oriented. Skin: Mild jaundice.   Lab Results:  Lab Results  Component Value Date   WBC 5.0 02/10/2024   HGB 10.0 (L) 02/10/2024   HCT 32.6 (L) 02/10/2024   MCV 104.8 (H) 02/10/2024   PLT 123 (L) 02/10/2024   NEUTROABS 3.6 02/10/2024    Imaging:  No results found.  Medications: I have reviewed the patient's current medications.  Assessment/Plan: Macrocytic anemia Thrombocytopenia Chronic hyperbilirubinemia Biliary obstruction  01/08/2024 abdominal ultrasound -there was no evidence of acute cholecystitis; dilation of the common bile duct without obstructing lesion was identified.   MRCP 01/10/2024-marked dilation of the common bile duct with mild prominence of the central intrahepatic biliary tree; question of a soft tissue mass along the duodenal wall and distal common duct. ERCP  01/13/2024-no obvious bulb or second portion of the duodenum mass was seen.  Deep selective cannulization was readily obtained and an obvious short distal stricture was confirmed.  A temporary plastic biliary stent was placed into the common bile duct.  Distal common bile duct brushing showed atypical cells. 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 Collister appears unchanged.  We reviewed the labs from today.  Hemoglobin is stable to mildly improved, bilirubin remains mildly elevated, LDH remains high, reticulocyte count is normal.  Dr. Cloretta discussed his case with Dr. Wonda last week.  Heart valve hemolysis is felt to be a possibility.  He will continue folic acid .  Plan to continue to follow with observation.  With regard to the biliary obstruction/common bile duct stricture he will follow-up with Dr. Rosalie.  He will return for a CBC in 2 weeks.  Lab and office visit in 4 weeks.  He will contact the office in the interim with any problems.  We specifically discussed signs/symptoms suggestive of progressive anemia.  Patient seen with Dr. Cloretta.    Olam Ned ANP/GNP-BC   02/10/2024  2:21 PM  This was a shared visit with Olam Ned.  We reviewed the laboratory findings with Mr. Schauer and his wife.  He has persistent anemia and hyperbilirubinemia.  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  The differential diagnosis includes a bone marrow process such as myelodysplasia. He presented with obstructive jaundice last month.  He may have an underlying malignancy causing bile duct obstruction.  I will contact Dr. Rosalie to review the indication for a diagnostic EUS.  I discussed the case with Dr. Wonda.  He will present Mr Isakson case at the heart failure conference  for further review of the recent echocardiogram.  I recommend observation for the hemolysis.  I was present for greater than 50% of today's visit.  I performed medical decision making.  Arvella Hof, MD

## 2024-02-10 NOTE — Telephone Encounter (Signed)
 Wife is calling to see that patient had blood work done at [facility] already. And wanted to see if that would cover our office lab request. Please advise

## 2024-02-10 NOTE — Progress Notes (Signed)
 CRITICAL VALUE STICKER  CRITICAL VALUE: Total bili = 3.6  RECEIVER (on-site recipient of call):Bunnie Lederman,RN  DATE & TIME NOTIFIED: 02/10/24 @ 1417  MESSENGER (representative from lab):Thersia  MD NOTIFIED: Olam ned, NP  TIME OF NOTIFICATION:1419  RESPONSE:

## 2024-02-10 NOTE — Telephone Encounter (Signed)
 Spoke with the patient's wife and advised that BMET was needed two weeks after medication changes were made so the lab work that he had done recently would be too soon. She verbalized understanding.

## 2024-02-11 ENCOUNTER — Other Ambulatory Visit: Payer: Self-pay | Admitting: Physician Assistant

## 2024-02-11 ENCOUNTER — Telehealth: Payer: Self-pay | Admitting: Physician Assistant

## 2024-02-11 DIAGNOSIS — T82897A Other specified complication of cardiac prosthetic devices, implants and grafts, initial encounter: Secondary | ICD-10-CM

## 2024-02-11 DIAGNOSIS — Z952 Presence of prosthetic heart valve: Secondary | ICD-10-CM

## 2024-02-11 NOTE — Telephone Encounter (Signed)
  [facility]   MULTIDISCIPLINARY HEART VALVE TEAM    Pt was recently seen by Dr. Cloretta for evaluation of new onset anemia. Work up revealed significant hemolysis. Pt recently underwent transseptal transcatheter valve in ring TMVR (26mm Sapien valve) with LAMPOON and mitral valve plug at [facility] (12/19/23). Post op TEE showed a well seated MV paravalvular plug. Well seated bioprosthetic mitral valve with good leaflet motion (final MV Mean PG 3 mmHg), trivial central mitral regurgitation, and minimal residual paravalvular leak.   I saw him in follow up on 01/30/24 and TTE showed EF 65%, normally functioning mitral valve in ring with a mean gradient of 8.7 mmHg and no MR as well as normally functioning TAVR with a mean gradient of 10.4 and trivial PVL.   We reviewed his case and echo at our multidisiplinary valve team meeting this morning. No obvious leaking was noted but images can be obscured by the amount of hardware in the mitral valve position. A TEE was recommended for better visualization.   I discussed this with Mrs Royce and they would like to proceed. This will be set up for 02/01/24 with Dr. Francyne.   After careful review of history and examination, the risks and benefits of transesophageal echocardiogram have been explained including risks of esophageal damage, perforation (1:10,000 risk), bleeding, pharyngeal hematoma as well as other potential complications associated with conscious sedation including aspiration, arrhythmia, respiratory failure and death. Alternatives to treatment were discussed, questions were answered. Patient is willing to proceed.   Lamarr Hummer, PA-C  02/11/2024 1:18 PM

## 2024-02-21 ENCOUNTER — Ambulatory Visit: Payer: Self-pay | Admitting: Physician Assistant

## 2024-02-21 LAB — BASIC METABOLIC PANEL WITH GFR
BUN/Creatinine Ratio: 25 — ABNORMAL HIGH (ref 10–24)
BUN: 29 mg/dL (ref 10–36)
CO2: 22 mmol/L (ref 20–29)
Calcium: 9.3 mg/dL (ref 8.6–10.2)
Chloride: 101 mmol/L (ref 96–106)
Creatinine, Ser: 1.15 mg/dL (ref 0.76–1.27)
Glucose: 117 mg/dL — ABNORMAL HIGH (ref 70–99)
Potassium: 4.3 mmol/L (ref 3.5–5.2)
Sodium: 139 mmol/L (ref 134–144)
eGFR: 60 mL/min/1.73 (ref >59)

## 2024-02-24 ENCOUNTER — Inpatient Hospital Stay: Attending: Nurse Practitioner

## 2024-02-24 DIAGNOSIS — D649 Anemia, unspecified: Secondary | ICD-10-CM | POA: Insufficient documentation

## 2024-02-24 DIAGNOSIS — K831 Obstruction of bile duct: Secondary | ICD-10-CM | POA: Diagnosis not present

## 2024-02-24 DIAGNOSIS — Z8546 Personal history of malignant neoplasm of prostate: Secondary | ICD-10-CM | POA: Diagnosis not present

## 2024-02-24 DIAGNOSIS — Z8551 Personal history of malignant neoplasm of bladder: Secondary | ICD-10-CM | POA: Insufficient documentation

## 2024-02-24 DIAGNOSIS — D696 Thrombocytopenia, unspecified: Secondary | ICD-10-CM | POA: Diagnosis not present

## 2024-02-24 LAB — CBC WITH DIFFERENTIAL (CANCER CENTER ONLY)
Abs Immature Granulocytes: 0.01 K/uL (ref 0.00–0.07)
Basophils Absolute: 0 K/uL (ref 0.0–0.1)
Basophils Relative: 1 %
Eosinophils Absolute: 0 K/uL (ref 0.0–0.5)
Eosinophils Relative: 1 %
HCT: 31 % — ABNORMAL LOW (ref 39.0–52.0)
Hemoglobin: 9.9 g/dL — ABNORMAL LOW (ref 13.0–17.0)
Immature Granulocytes: 0 %
Lymphocytes Relative: 24 %
Lymphs Abs: 1.1 K/uL (ref 0.7–4.0)
MCH: 31.4 pg (ref 26.0–34.0)
MCHC: 31.9 g/dL (ref 30.0–36.0)
MCV: 98.4 fL (ref 80.0–100.0)
Monocytes Absolute: 0.5 K/uL (ref 0.1–1.0)
Monocytes Relative: 11 %
Neutro Abs: 3 K/uL (ref 1.7–7.7)
Neutrophils Relative %: 63 %
Platelet Count: 130 K/uL — ABNORMAL LOW (ref 150–400)
RBC: 3.15 MIL/uL — ABNORMAL LOW (ref 4.22–5.81)
RDW: 13.8 % (ref 11.5–15.5)
WBC Count: 4.7 K/uL (ref 4.0–10.5)
nRBC: 0 % (ref 0.0–0.2)

## 2024-02-24 LAB — SAMPLE TO BLOOD BANK

## 2024-02-25 ENCOUNTER — Telehealth: Payer: Self-pay

## 2024-02-25 ENCOUNTER — Telehealth: Payer: Self-pay | Admitting: *Deleted

## 2024-02-25 NOTE — Telephone Encounter (Signed)
 Patient gave verbal understanding and had no further questions.

## 2024-02-25 NOTE — Telephone Encounter (Signed)
-----   Message from Olam Ned sent at 02/25/2024  8:34 AM EDT ----- Please let him know hemoglobin is stable.  Follow-up as scheduled.

## 2024-02-25 NOTE — Telephone Encounter (Signed)
 MD reviewed 10/06 CBC. Informed wife that lab is stable. No transfusion needed. F/U as scheduled.

## 2024-02-28 ENCOUNTER — Other Ambulatory Visit: Payer: Self-pay | Admitting: Physician Assistant

## 2024-02-28 ENCOUNTER — Other Ambulatory Visit: Payer: Self-pay | Admitting: Cardiology

## 2024-02-28 DIAGNOSIS — I5032 Chronic diastolic (congestive) heart failure: Secondary | ICD-10-CM

## 2024-02-28 NOTE — Progress Notes (Signed)
 Spoke to patient and instructed them to come at 0915  and to be NPO after 0000.     Confirmed that patient will have a ride home and someone to stay with them for 24 hours after the procedure.   Medications reviewed.  Confirmed blood thinner.  Confirmed no breaks in taking blood thinner for 3+ weeks prior to procedure.

## 2024-03-02 ENCOUNTER — Ambulatory Visit (HOSPITAL_COMMUNITY)
Admission: RE | Admit: 2024-03-02 | Discharge: 2024-03-02 | Disposition: A | Discharge status: Home / Self Care | Attending: Cardiovascular Disease | Admitting: Cardiovascular Disease

## 2024-03-02 ENCOUNTER — Other Ambulatory Visit: Payer: Self-pay

## 2024-03-02 ENCOUNTER — Ambulatory Visit (HOSPITAL_COMMUNITY)

## 2024-03-02 ENCOUNTER — Encounter (HOSPITAL_COMMUNITY)
Admission: RE | Disposition: A | Discharge status: Home / Self Care | Source: Home / Self Care | Attending: Cardiovascular Disease

## 2024-03-02 ENCOUNTER — Ambulatory Visit (HOSPITAL_COMMUNITY)
Admission: RE | Admit: 2024-03-02 | Discharge: 2024-03-02 | Disposition: A | Source: Ambulatory Visit | Attending: Physician Assistant

## 2024-03-02 DIAGNOSIS — Z86718 Personal history of other venous thrombosis and embolism: Secondary | ICD-10-CM | POA: Insufficient documentation

## 2024-03-02 DIAGNOSIS — Z86711 Personal history of pulmonary embolism: Secondary | ICD-10-CM | POA: Diagnosis not present

## 2024-03-02 DIAGNOSIS — Z953 Presence of xenogenic heart valve: Secondary | ICD-10-CM | POA: Diagnosis not present

## 2024-03-02 DIAGNOSIS — Z951 Presence of aortocoronary bypass graft: Secondary | ICD-10-CM | POA: Diagnosis not present

## 2024-03-02 DIAGNOSIS — I5033 Acute on chronic diastolic (congestive) heart failure: Secondary | ICD-10-CM

## 2024-03-02 DIAGNOSIS — J9601 Acute respiratory failure with hypoxia: Secondary | ICD-10-CM | POA: Diagnosis not present

## 2024-03-02 DIAGNOSIS — I080 Rheumatic disorders of both mitral and aortic valves: Secondary | ICD-10-CM | POA: Insufficient documentation

## 2024-03-02 DIAGNOSIS — I342 Nonrheumatic mitral (valve) stenosis: Secondary | ICD-10-CM

## 2024-03-02 DIAGNOSIS — Z7901 Long term (current) use of anticoagulants: Secondary | ICD-10-CM | POA: Insufficient documentation

## 2024-03-02 DIAGNOSIS — I2510 Atherosclerotic heart disease of native coronary artery without angina pectoris: Secondary | ICD-10-CM | POA: Insufficient documentation

## 2024-03-02 DIAGNOSIS — Z952 Presence of prosthetic heart valve: Secondary | ICD-10-CM

## 2024-03-02 DIAGNOSIS — D589 Hereditary hemolytic anemia, unspecified: Secondary | ICD-10-CM | POA: Diagnosis not present

## 2024-03-02 DIAGNOSIS — I340 Nonrheumatic mitral (valve) insufficiency: Secondary | ICD-10-CM

## 2024-03-02 DIAGNOSIS — Z79899 Other long term (current) drug therapy: Secondary | ICD-10-CM | POA: Insufficient documentation

## 2024-03-02 DIAGNOSIS — T82897A Other specified complication of cardiac prosthetic devices, implants and grafts, initial encounter: Secondary | ICD-10-CM

## 2024-03-02 DIAGNOSIS — I4891 Unspecified atrial fibrillation: Secondary | ICD-10-CM

## 2024-03-02 DIAGNOSIS — Z8673 Personal history of transient ischemic attack (TIA), and cerebral infarction without residual deficits: Secondary | ICD-10-CM | POA: Insufficient documentation

## 2024-03-02 HISTORY — PX: TRANSESOPHAGEAL ECHOCARDIOGRAM (CATH LAB): EP1270

## 2024-03-02 LAB — ECHO TEE
AR max vel: 1.68 cm2
AV Area VTI: 1.65 cm2
AV Area mean vel: 1.79 cm2
AV Mean grad: 14 mmHg
AV Peak grad: 23 mmHg
Ao pk vel: 2.4 m/s
Area-P 1/2: 1.88 cm2

## 2024-03-02 SURGERY — TRANSESOPHAGEAL ECHOCARDIOGRAM (TEE) (CATHLAB)
Anesthesia: Monitor Anesthesia Care

## 2024-03-02 MED ORDER — SODIUM CHLORIDE 0.9 % IV SOLN: INTRAVENOUS | Status: DC

## 2024-03-02 MED ORDER — PHENYLEPHRINE 80 MCG/ML (10ML) SYRINGE FOR IV PUSH (FOR BLOOD PRESSURE SUPPORT)
PREFILLED_SYRINGE | INTRAVENOUS | Status: DC | PRN
  Administered 2024-03-02 (×2): 160 ug via INTRAVENOUS

## 2024-03-02 MED ORDER — LIDOCAINE 2% (20 MG/ML) 5 ML SYRINGE
INTRAMUSCULAR | Status: DC | PRN
  Administered 2024-03-02: 50 mg via INTRAVENOUS

## 2024-03-02 MED ORDER — PROPOFOL 10 MG/ML IV BOLUS
INTRAVENOUS | Status: DC | PRN
  Administered 2024-03-02: 150 ug/kg/min via INTRAVENOUS

## 2024-03-02 NOTE — Anesthesia Preprocedure Evaluation (Addendum)
 Anesthesia Evaluation  Patient identified by MRN, date of birth, ID band Patient awake    Reviewed: Allergy & Precautions, H&P , NPO status , Patient's Chart, lab work & pertinent test results  Airway Mallampati: II  TM Distance: >3 FB Neck ROM: Full    Dental no notable dental hx. (+) Teeth Intact, Dental Advisory Given   Pulmonary neg pulmonary ROS   Pulmonary exam normal breath sounds clear to auscultation       Cardiovascular + dysrhythmias Atrial Fibrillation + Valvular Problems/Murmurs  Rhythm:Regular Rate:Normal     Neuro/Psych CVA  negative psych ROS   GI/Hepatic negative GI ROS, Neg liver ROS,,,  Endo/Other  negative endocrine ROS    Renal/GU negative Renal ROS  negative genitourinary   Musculoskeletal   Abdominal   Peds  Hematology negative hematology ROS (+)   Anesthesia Other Findings   Reproductive/Obstetrics negative OB ROS                              Anesthesia Physical Anesthesia Plan  ASA: 3  Anesthesia Plan: MAC   Post-op Pain Management: Minimal or no pain anticipated   Induction: Intravenous  PONV Risk Score and Plan: 1 and Propofol  infusion  Airway Management Planned: Natural Airway and Simple Face Mask  Additional Equipment:   Intra-op Plan:   Post-operative Plan:   Informed Consent: I have reviewed the patients History and Physical, chart, labs and discussed the procedure including the risks, benefits and alternatives for the proposed anesthesia with the patient or authorized representative who has indicated his/her understanding and acceptance.     Dental advisory given  Plan Discussed with: CRNA  Anesthesia Plan Comments:          Anesthesia Quick Evaluation

## 2024-03-02 NOTE — H&P (Signed)
 Cardiology Admission History and Physical   Patient ID: [name] MRN: [id number]; DOB: 05/15/1930   Admission date: 03/02/2024  PCP:  Eric Millman, MD   [facility] Providers Cardiologist:  Ozell Fell, MD  Structural Heart:  Ozell Fell, MD     Chief Complaint:  Hemolytic anemia/valvular heart disease  Patient Profile: Eric [name] is a 89 y.o. male with extensive history of valvular heart disease who is being seen 03/02/2024 for the evaluation of perivalvular leak as a cause of hemolytic anemia.  History of Present Illness: Eric [name] has an extensive history of multiple valve procedures including bioprosthetic AVR (23 mm Mitroflow) and mitral valve repair (28 mm Sorin 3D memo annuloplasty ring) both performed at the same time as CABG x 1 with SVG-OM1 in 2009, valve in valve TAVR in 2020 for severe AI, transcatheter valve in ring TMVR (26 mm SAPIEN, with lampoon), as well as mitral valve plugging of a periannular leak (performed at [facility], 12/19/2023), after which she was identified as having minimal residual paravalvular leak.  S in August he underwent MRCP for soft tissue mass and obstructive hyperbilirubinemia sphincterotomy and placement of a biliary stent.  Despite  those procedure he has had persistent elevated total bilirubin and LDH and mild anemia, consistent with hemolysis.  He has a history of DVT/PE and history of paroxysmal atrial flutter on chronic anticoagulation   Past Medical History:  Diagnosis Date   Atrial flutter, paroxysmal (HCC)    a. dx 04-02-2014, s/p  successful cardioversion 04-06-2014.   Chronic anticoagulation    on Eliquis --  due to recurrent dvt's and pe's   Dyslipidemia    History of bladder cancer urologist-  dr chauncey   02-13-2016  s/p TURBT per path high grade papillary urothelial carcinoma   History of DVT (deep vein thrombosis)    2000-- RLE   History of melanoma excision    left flank;  07/ 2014 left  lower leg and right upper chest   History of prostate cancer    Gleason 6--  s/p  radical prostatectomy 06/ 2000 in [place], [place]---  no recurrence   History of pulmonary embolus (PE)    2000 & 2005  bilateral   Hx of valvuloplasty 06/19/2007   a. 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 05/2007 CABG x 1: s/p SVG-OM;  b. 10/2010 Ex MV: EF 72%, inf attenuation w/o ischemia, brief run of PAT with exercise. To   Stroke [facility])    Thrombocytopenia    Thyroid  goiter 2013   nodular   Wears hearing aid    BILATERAL   Wears partial dentures    UPPER   Past Surgical History:  Procedure Laterality Date   AORTIC VALVE REPLACEMENT (AVR)/CORONARY ARTERY BYPASS GRAFTING (CABG)  06-19-2007  dr lucas   SVG to OM1 ;   AVR w/ #23 Mitralflow pericardial and ligation left atrial appendage;  MV repair w/ #28 Sorin 3-D memo Ring Annuloplasty with repair ruptured chordar of P-2 flail segments   CARDIAC CATHETERIZATION  05-12-2007  dr malva   single vessel 30% ostial LAD,  80% LCx;  severe to critial AS,  moderate MR,  normal LVF, ef 70%,  normal right heart pressures  and cardiac outputs,  mild elevated LV end-diastolic pressure     CARDIOVASCULAR STRESS TEST  11-02-2010  dr dann   normal nuclear study w/ no ischemia or infarct/scar/  normal LV function and wall motion , ef 72%   CARDIOVERSION N/A 04/06/2014   Procedure: CARDIOVERSION;  Surgeon: Leim VEAR Moose, MD;  Location: [facility] ENDOSCOPY;  Service: Cardiovascular;  Laterality: N/A;   successful   CATARACT EXTRACTION W/ INTRAOCULAR LENS  IMPLANT, BILATERAL  2012  approx   CORONARY ARTERY BYPASS GRAFT     ERCP N/A 01/13/2024   Procedure: ERCP, WITH INTERVENTION IF INDICATED;  Surgeon: Rosalie Kitchens, MD;   Location: [facility] ENDOSCOPY;  Service: Gastroenterology;  Laterality: N/A;   PROSTATECTOMY     RETROPUBIC RADICAL PROSTATECTOMY  06/ 2000  in [place], [place]   RIGHT/LEFT HEART CATH AND CORONARY/GRAFT ANGIOGRAPHY N/A 10/29/2018   Procedure: RIGHT/LEFT HEART CATH AND CORONARY/GRAFT ANGIOGRAPHY;  Surgeon: Wonda Sharper, MD;  Location: [facility] INVASIVE CV LAB;  Service: Cardiovascular;  Laterality: N/A;   RIGHT/LEFT HEART CATH AND CORONARY/GRAFT ANGIOGRAPHY N/A 10/04/2023   Procedure: RIGHT/LEFT HEART CATH AND CORONARY/GRAFT ANGIOGRAPHY;  Surgeon: Wonda Sharper, MD;  Location: [facility] INVASIVE CV LAB;  Service: Cardiovascular;  Laterality: N/A;   ROTATOR CUFF REPAIR Left 09/1998   TEE WITHOUT CARDIOVERSION N/A 04/06/2014   Procedure: TRANSESOPHAGEAL ECHOCARDIOGRAM (TEE);  Surgeon: Leim VEAR Moose, MD;  Location: [facility] ENDOSCOPY;  Service: Cardiovascular;  Laterality: N/A;  mild focal basa LVH of the septum, ef 50-55%, s/p AV annuloplasty ring, elongated chordae, thickened leaflets, mild to mod. MR, multiple small jets/arotic bioprosthetic valve sits well in position, mild AI/ thickened TV w/ mild reurg./mild LA   TEE WITHOUT CARDIOVERSION N/A 10/22/2018   Procedure: TRANSESOPHAGEAL ECHOCARDIOGRAM (TEE);  Surgeon: Jeffrie Oneil BROCKS, MD;  Location: [facility] ENDOSCOPY;  Service: Cardiovascular;  Laterality: N/A;   TEE WITHOUT CARDIOVERSION N/A 11/25/2018   Procedure: TRANSESOPHAGEAL ECHOCARDIOGRAM (TEE);  Surgeon: Wonda Sharper, MD;  Location: [facility] OR;  Service: Open Heart Surgery;  Laterality: N/A;   TRANSCATHETER AORTIC VALVE REPLACEMENT, TRANSFEMORAL N/A 11/25/2018   Procedure: TRANSCATHETER AORTIC VALVE REPLACEMENT, TRANSFEMORAL;  Surgeon: Wonda Sharper, MD;  Location: [facility] OR;  Service: Open Heart Surgery;  Laterality: N/A;   TRANSESOPHAGEAL ECHOCARDIOGRAM (CATH LAB) N/A 10/04/2023   Procedure: TRANSESOPHAGEAL ECHOCARDIOGRAM;  Surgeon: Santo Stanly LABOR, MD;  Location: [facility] INVASIVE CV LAB;  Service: Cardiovascular;   Laterality: N/A;   TRANSTHORACIC ECHOCARDIOGRAM  03-23-2016   dr dann   EF 55-60%, reduced contribution atrial contraction to ventricular filling, due to increased ventricular diastolic pressure or atrial contratile dysfunction/  bioprosthetic AV w/ mod. regurg. (valve area 1.97cm^2)/ mild dilated ascending aorta/ mild thicken MV normal function annular ring prosthesis/severe LAE & mod. to sev.RAE/ PASP 59mmHg/ mild TR/ ventricle septal motion show paradox   TRANSURETHRAL RESECTION OF BLADDER TUMOR N/A 02/13/2016   Procedure: TRANSURETHRAL RESECTION OF BLADDER TUMOR (TURBT);  Surgeon: Redell Lynwood Napoleon, MD;  Location: [facility];  Service: Urology;  Laterality: N/A;   TRANSURETHRAL RESECTION OF BLADDER TUMOR N/A 09/04/2016   Procedure: TRANSURETHRAL RESECTION OF BLADDER TUMOR (TURBT);  Surgeon: Redell Lynwood Napoleon, MD;  Location: [facility];  Service: Urology;  Laterality: N/A;     Medications Prior to Admission: Prior to Admission medications   Medication Sig Start Date End Date Taking? Authorizing Provider  amoxicillin  (AMOXIL ) 500 MG tablet Take 4 tablets (2,000 mg total) by mouth as directed. Take 1 hour prior to dental work, including cleanings. 04/27/19  Yes dann Beverage  S, MD  apixaban  (ELIQUIS ) 5 MG TABS tablet TAKE 1 TABLET BY MOUTH TWICE  DAILY 10/08/23  Yes Wonda Sharper, MD  Ascorbic Acid (VITAMIN C PO) Take 500 mg by mouth in the morning. Gummies 250 mg each   Yes [provider]  atorvastatin  (LIPITOR) 20 MG tablet Take 1 tablet (20 mg total) by mouth daily. Patient taking differently: Take 40 mg by mouth daily. 01/30/24 04/29/24 Yes Sebastian Lamarr SAUNDERS, PA-C  cyanocobalamin (VITAMIN B12) 1000 MCG tablet Take 1,000 mcg by mouth daily.   Yes [provider]  diltiazem  (CARDIZEM ) 30 MG tablet Take 1 tablet (30 mg total) by mouth as needed. Take as needed for palpitations 01/30/24  Yes Sebastian Lamarr SAUNDERS, PA-C  Eyelid Cleansers  (OCUSOFT EYELID CLEANSING) PADS Place 1 application  into both eyes in the morning.   Yes [provider]  ezetimibe  (ZETIA ) 10 MG tablet Take 1 tablet (10 mg total) by mouth daily. 01/30/24 04/29/24 Yes Sebastian Lamarr SAUNDERS, PA-C  folic acid  (FOLVITE ) 1 MG tablet Take 1 tablet (1 mg total) by mouth daily. 02/03/24  Yes Debby Olam POUR, NP  furosemide  (LASIX ) 20 MG tablet Take 2 tablets (40 mg total) by mouth daily. 01/30/24  Yes Sebastian Lamarr SAUNDERS, PA-C  metoprolol  tartrate (LOPRESSOR ) 25 MG tablet Take 2 tablets (50 mg total) by mouth 2 (two) times daily. 12/30/23  Yes Sebastian Lamarr SAUNDERS, PA-C  nitroGLYCERIN  (NITROSTAT ) 0.4 MG SL tablet Place 1 tablet (0.4 mg total) under the tongue every 5 (five) minutes as needed. 02/04/23 02/26/24 Yes Dann Candyce RAMAN, MD  polyethylene glycol (MIRALAX  / GLYCOLAX ) 17 g packet Take 17 g by mouth in the morning.   Yes [provider]  potassium chloride  (KLOR-CON  M) 10 MEQ tablet Take 10 mEq by mouth daily. Take with Furosemide    Yes [provider]  sennosides-docusate sodium (SENOKOT-S) 8.6-50 MG tablet Take 1 tablet by mouth at bedtime.   Yes [provider]  spironolactone  (ALDACTONE ) 25 MG tablet Take 0.5 tablets (12.5 mg total) by mouth in the morning. 12/30/23  Yes Sebastian Lamarr SAUNDERS, PA-C  traZODone (DESYREL) 50 MG tablet Take 75 mg by mouth at bedtime. 01/22/24  Yes [provider]     Allergies:   No Known Allergies  Social History:   Social History   Socioeconomic History   Marital status: Married    Spouse name: Ann   Number of children: 2   Years of education: Not on file   Highest education level: Some college, no degree  Occupational History   Occupation: Retired  Tobacco Use   Smoking status: Never   Smokeless tobacco: Never  Vaping Use   Vaping status: Never Used  Substance and Sexual Activity   Alcohol use: Yes    Alcohol/week: 1.0 - 2.0 standard drink of alcohol    Types: 1 - 2 Standard  drinks or equivalent per week    Comment: OCC   Drug use: No   Sexual activity: Not on file  Other Topics Concern   Not on file  Social History Narrative   01/03/21 Lives in [place] with wife.  Active.   Caffeine- 1-2 c coffee daily   Social Drivers of Corporate investment banker Strain: Low Risk  (02/03/2024)   Overall Financial Resource Strain (CARDIA)    Difficulty of Paying Living Expenses: Not very hard  Food Insecurity: No Food Insecurity (02/03/2024)   Hunger Vital Sign    Worried About Running Out of Food  in the Last Year: Never true    Ran Out of Food in the Last Year: Never true  Transportation Needs: No Transportation Needs (02/03/2024)   PRAPARE - Administrator, Civil Service (Medical): No    Lack of Transportation (Non-Medical): No  Physical Activity: Insufficiently Active (02/03/2024)   Exercise Vital Sign    Days of Exercise per Week: 2 days    Minutes of Exercise per Session: 40 min  Stress: No Stress Concern Present (02/03/2024)   [employer] of Occupational Health - Occupational Stress Questionnaire    Feeling of Stress: Not at all  Social Connections: Socially Integrated (02/03/2024)   Social Connection and Isolation Panel    Frequency of Communication with Friends and Family: More than three times a week    Frequency of Social Gatherings with Friends and Family: Three times a week    Attends Religious Services: More than 4 times per year    Active Member of Clubs or Organizations: Yes    Attends Banker Meetings: More than 4 times per year    Marital Status: Married  Catering manager Violence: Not At Risk (02/03/2024)   Humiliation, Afraid, Rape, and Kick questionnaire    Fear of Current or Ex-Partner: No    Emotionally Abused: No    Physically Abused: No    Sexually Abused: No     Family History:   The patient's family history includes Leukemia in his father; Supraventricular tachycardia in his sister. There is no history of Heart  attack, Stroke, or Hypertension.    ROS:  Please see the history of present illness.  All other ROS reviewed and negative.     Physical Exam/Data: Vitals:   03/02/24 0920  BP: (!) 143/90  Pulse: 67  Resp: 11  Temp: 98.2 F (36.8 C)  TempSrc: Temporal  SpO2: 99%  Weight: 75.3 kg  Height: 6' (1.829 m)   No intake or output data in the 24 hours ending 03/02/24 0935    03/02/2024    9:20 AM 02/10/2024    1:19 PM 02/03/2024    2:16 PM  Last 3 Weights  Weight (lbs) 166 lb 166 lb 14.4 oz 167 lb  Weight (kg) 75.297 kg 75.705 kg 75.751 kg     Body mass index is 22.51 kg/m.  General:  Well nourished, well developed, in no acute distress. Jaundice HEENT: normal Neck: no JVD Vascular: No carotid bruits; Distal pulses 2+ bilaterally   Cardiac:  normal S1, S2; RRR; no murmur  Lungs:  clear to auscultation bilaterally, no wheezing, rhonchi or rales  Abd: soft, nontender, no hepatomegaly  Ext: no edema Musculoskeletal:  No deformities, BUE and BLE strength normal and equal Skin: warm and dry  Neuro:  CNs 2-12 intact, no focal abnormalities noted Psych:  Normal affect    Relevant CV Studies: Echocardiogram 01/30/2024   1. 26 mm S3 Edwards Sapien Vaive in [place]. Emax 2.4 m/s, MG 8.7 mmHG @ 57  bpm, EOA 1.82 cm2. Normal prosthesis. The mitral valve has been  repaired/replaced. No evidence of mitral valve regurgitation. There is a  26 mm bioprosthetic valve present in the  mitral position. Procedure Date: 12/19/2023. Echo findings are consistent  with normal structure and function of the mitral valve prosthesis.   2. Left ventricular ejection fraction, by estimation, is 65 to 70%. The  left ventricle has normal function. The left ventricle has no regional  wall motion abnormalities. There is mild asymmetric left ventricular  hypertrophy  of the basal-septal segment.  Left ventricular diastolic function could not be evaluated.   3. Right ventricular systolic function is mildly  reduced. The right  ventricular size is moderately enlarged. There is normal pulmonary artery  systolic pressure. The estimated right ventricular systolic pressure is  34.6 mmHg.   4. Left atrial size was severely dilated.   5. Right atrial size was moderately dilated.   6. Tricuspid valve regurgitation is moderate.   7. 23 mm S3 Valve in Valve TAVR. The aortic valve has been  repaired/replaced. Aortic valve regurgitation is trivial. Procedure Date:  11/25/2018. Echo findings are consistent with normal structure and function  of the aortic valve prosthesis. Aortic valve  area, by VTI measures 2.43 cm. Aortic valve mean gradient measures 10.4  mmHg. Aortic valve Vmax measures 2.39 m/s.   8. The inferior vena cava is normal in size with greater than 50%  respiratory variability, suggesting right atrial pressure of 3 mmHg.   Comparison(s): Changes from prior study are noted. S/p TMVR.    Laboratory Data: High Sensitivity Troponin:  No results for input(s): TROPONINIHS in the last 720 hours.    ChemistryNo results for input(s): NA, K, CL, CO2, GLUCOSE, BUN, CREATININE, CALCIUM , MG, GFRNONAA, GFRAA, ANIONGAP in the last 168 hours.  No results for input(s): PROT, ALBUMIN, AST, ALT, ALKPHOS, BILITOT in the last 168 hours. Lipids No results for input(s): CHOL, TRIG, HDL, LABVLDL, LDLCALC, CHOLHDL in the last 168 hours. Hematology Recent Labs  Lab 02/24/24 1142  WBC 4.7  RBC 3.15*  HGB 9.9*  HCT 31.0*  MCV 98.4  MCH 31.4  MCHC 31.9  RDW 13.8  PLT 130*   Thyroid  No results for input(s): TSH, FREET4 in the last 168 hours. BNPNo results for input(s): BNP, PROBNP in the last 168 hours.  DDimer No results for input(s): DDIMER in the last 168 hours.  Radiology/Studies:  No results found.   Assessment and Plan: Elderly gentleman with extensive aortic and mitral valve disease, with persistent labs consistent with hemolytic  anemia, roughly 10 weeks s/p TMVR (valve in ring) and plug occlusion of periannular leak.  Here for TEE to see if there is residual leak that could explain hemolysis.  Informed Consent   Shared Decision Making/Informed Consent   The risks [esophageal damage, perforation (1:10,000 risk), bleeding, pharyngeal hematoma as well as other potential complications associated with conscious sedation including aspiration, arrhythmia, respiratory failure and death], benefits (treatment guidance and diagnostic support) and alternatives of a transesophageal echocardiogram were discussed in detail with Mr. Chittick and he is willing to proceed.        For questions or updates, please contact [facility] Please consult [email or web address] for contact info under       Signed, Jerel Balding, MD  03/02/2024 9:35 AM

## 2024-03-02 NOTE — Transfer of Care (Signed)
 Immediate Anesthesia Transfer of Care Note  Patient: Eric [name]  Procedure(s) Performed: TRANSESOPHAGEAL ECHOCARDIOGRAM  Patient Location: Cath Lab  Anesthesia Type:MAC  Level of Consciousness: drowsy  Airway & Oxygen Therapy: Patient Spontanous Breathing  Post-op Assessment: Report given to RN and Post -op Vital signs reviewed and stable  Post vital signs: Reviewed and stable  Last Vitals:  Vitals Value Taken Time  BP 106/61 03/02/24 10:17  Temp    Pulse 67 03/02/24 10:18  Resp 12 03/02/24 10:18  SpO2 97 % 03/02/24 10:18  Vitals shown include unfiled device data.  Last Pain:  Vitals:   03/02/24 1017  TempSrc:   PainSc: Asleep         Complications: There were no known notable events for this encounter.

## 2024-03-02 NOTE — Op Note (Signed)
 INDICATIONS: MVR related hemolysis  PROCEDURE:   Informed consent was obtained prior to the procedure. The risks, benefits and alternatives for the procedure were discussed and the patient comprehended these risks.  Risks include, but are not limited to, cough, sore throat, vomiting, nausea, somnolence, esophageal and stomach trauma or perforation, bleeding, low blood pressure, aspiration, pneumonia, infection, trauma to the teeth and death.    After a procedural time-out, the oropharynx was anesthetized with 20% benzocaine  spray.   During this procedure the patient was administered IV propofol  by Dr. CHARLENA Needle.  The transesophageal probe was inserted in the esophagus and stomach without difficulty and multiple views were obtained.  The patient was kept under observation until the patient left the procedure room.  The patient left the procedure room in stable condition.   Agitated microbubble saline contrast was not administered.  COMPLICATIONS:    There were no immediate complications.  FINDINGS:  Normal LV function and wall motion. Dilated left atrium. Small iatrogenic ASD with bidirectional shunt (predominantly L to R, but with significant reversal with each breath). There is a moderate residual peri-annular leak around the plug at athe anterolateral (10 o'clock aspect of the TMVR). There is also trivial intravalvular MR. Mildly elevated MV gradient, mean 7 mm Hg. The left atrial appendage ostium is partially occluded (oversewn?), but remains permeable through a small defect not far from the mitral annulus plug and is not thrombosed. Normal function of the TAVR valve. 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  RECOMMENDATIONS:     Discuss in structural heart team meeting, if there is any option for device closure.  Time Spent Directly with the Patient:  45 minutes   Eric [name] 03/02/2024, 10:12 AM

## 2024-03-02 NOTE — Anesthesia Postprocedure Evaluation (Signed)
 Anesthesia Post Note  Patient: Eric [name]  Procedure(s) Performed: TRANSESOPHAGEAL ECHOCARDIOGRAM     Patient location during evaluation: Cath Lab Anesthesia Type: MAC Level of consciousness: awake and alert Pain management: pain level controlled Vital Signs Assessment: post-procedure vital signs reviewed and stable Respiratory status: spontaneous breathing, nonlabored ventilation and respiratory function stable Cardiovascular status: stable and blood pressure returned to baseline Postop Assessment: no apparent nausea or vomiting Anesthetic complications: no   There were no known notable events for this encounter.  Last Vitals:  Vitals:   03/02/24 1049 03/02/24 1050  BP: 100/64 96/74  Pulse: (!) 54 60  Resp: 12 17  Temp:    SpO2: 92% 98%    Last Pain:  Vitals:   03/02/24 1030  TempSrc:   PainSc: 0-No pain                 Kalijah Zeiss,W. EDMOND

## 2024-03-03 ENCOUNTER — Ambulatory Visit: Payer: Self-pay | Admitting: Physician Assistant

## 2024-03-03 ENCOUNTER — Encounter (HOSPITAL_COMMUNITY): Payer: Self-pay | Admitting: Cardiovascular Disease

## 2024-03-06 ENCOUNTER — Telehealth: Payer: Self-pay

## 2024-03-06 ENCOUNTER — Other Ambulatory Visit: Payer: Self-pay | Admitting: Physician Assistant

## 2024-03-06 NOTE — Telephone Encounter (Signed)
 The patient's wife contacted us  to confirm that we reviewed his ECHO TEE. I informed her that I can  the ECHO and that we will see them during the upcoming follow-up appointment.

## 2024-03-10 ENCOUNTER — Inpatient Hospital Stay: Admitting: Oncology

## 2024-03-10 ENCOUNTER — Inpatient Hospital Stay

## 2024-03-10 VITALS — BP 98/65 | HR 62 | Temp 97.9°F | Resp 18 | Ht 72.0 in | Wt 168.8 lb

## 2024-03-10 DIAGNOSIS — D649 Anemia, unspecified: Secondary | ICD-10-CM | POA: Diagnosis not present

## 2024-03-10 DIAGNOSIS — D696 Thrombocytopenia, unspecified: Secondary | ICD-10-CM

## 2024-03-10 LAB — CMP (CANCER CENTER ONLY)
ALT: 9 U/L (ref 0–44)
AST: 57 U/L — ABNORMAL HIGH (ref 15–41)
Albumin: 4.1 g/dL (ref 3.5–5.0)
Alkaline Phosphatase: 77 U/L (ref 38–126)
Anion gap: 9 (ref 5–15)
BUN: 24 mg/dL — ABNORMAL HIGH (ref 8–23)
CO2: 25 mmol/L (ref 22–32)
Calcium: 9.7 mg/dL (ref 8.9–10.3)
Chloride: 103 mmol/L (ref 98–111)
Creatinine: 1.13 mg/dL (ref 0.61–1.24)
GFR, Estimated: >60 mL/min (ref >60)
Glucose, Bld: 104 mg/dL — ABNORMAL HIGH (ref 70–99)
Potassium: 4.4 mmol/L (ref 3.5–5.1)
Sodium: 136 mmol/L (ref 135–145)
Total Bilirubin: 4.2 mg/dL (ref 0.0–1.2)
Total Protein: 7.2 g/dL (ref 6.5–8.1)

## 2024-03-10 LAB — LACTATE DEHYDROGENASE: LDH: 1044 U/L — ABNORMAL HIGH (ref 98–192)

## 2024-03-10 LAB — CBC WITH DIFFERENTIAL (CANCER CENTER ONLY)
Abs Immature Granulocytes: 0.01 K/uL (ref 0.00–0.07)
Basophils Absolute: 0 K/uL (ref 0.0–0.1)
Basophils Relative: 1 %
Eosinophils Absolute: 0.1 K/uL (ref 0.0–0.5)
Eosinophils Relative: 3 %
HCT: 32.7 % — ABNORMAL LOW (ref 39.0–52.0)
Hemoglobin: 10.4 g/dL — ABNORMAL LOW (ref 13.0–17.0)
Immature Granulocytes: 0 %
Lymphocytes Relative: 24 %
Lymphs Abs: 1.2 K/uL (ref 0.7–4.0)
MCH: 30.9 pg (ref 26.0–34.0)
MCHC: 31.8 g/dL (ref 30.0–36.0)
MCV: 97 fL (ref 80.0–100.0)
Monocytes Absolute: 0.5 K/uL (ref 0.1–1.0)
Monocytes Relative: 10 %
Neutro Abs: 2.9 K/uL (ref 1.7–7.7)
Neutrophils Relative %: 62 %
Platelet Count: 131 K/uL — ABNORMAL LOW (ref 150–400)
RBC: 3.37 MIL/uL — ABNORMAL LOW (ref 4.22–5.81)
RDW: 14.1 % (ref 11.5–15.5)
WBC Count: 4.7 K/uL (ref 4.0–10.5)
nRBC: 0 % (ref 0.0–0.2)

## 2024-03-10 LAB — SAMPLE TO BLOOD BANK

## 2024-03-10 LAB — RETIC PANEL
Immature Retic Fract: 16.2 % — ABNORMAL HIGH (ref 2.3–15.9)
RBC.: 3.44 MIL/uL — ABNORMAL LOW (ref 4.22–5.81)
Retic Count, Absolute: 82.9 K/uL (ref 19.0–186.0)
Retic Ct Pct: 2.4 % (ref 0.4–3.1)
Reticulocyte Hemoglobin: 30.2 pg (ref >27.9)

## 2024-03-10 NOTE — Progress Notes (Signed)
 CRITICAL VALUE STICKER  CRITICAL VALUE: Total Bili=4.2  RECEIVER (on-site recipient of call):Dishawn Bhargava,RN  DATE & TIME NOTIFIED: 03/10/24 @ 1004  MESSENGER (representative from lab):Alexis  MD NOTIFIED: Dr. Cloretta  TIME OF NOTIFICATION:1006  RESPONSE:  MD seeing patient currently

## 2024-03-10 NOTE — Progress Notes (Signed)
 [facility] OFFICE PROGRESS NOTE   Diagnosis: Anemia  INTERVAL HISTORY:   Eric [name] returns as scheduled.  He is here with his wife and son.  They report his appetite has improved.  No dyspnea.  He has persistent jaundice.  He underwent a TEE on 03/02/2024.  He is scheduled to see Dr. Wonda 03/20/2024.  Objective:  Vital signs in last 24 hours:  Blood pressure 98/65, pulse 62, temperature 97.9 F (36.6 C), temperature source Temporal, resp. rate 18, height 6' (1.829 m), weight 168 lb 12.8 oz (76.6 kg), SpO2 100%.    HEENT: Scleral icterus Resp: Lungs clear bilaterally Cardio: Irregular GI: No hepatosplenomegaly Vascular: 1+ pitting edema at the lower leg bilaterally  Skin: Jaundice   Lab Results:  Lab Results  Component Value Date   WBC 4.7 03/10/2024   HGB 10.4 (L) 03/10/2024   HCT 32.7 (L) 03/10/2024   MCV 97.0 03/10/2024   PLT 131 (L) 03/10/2024   NEUTROABS 2.9 03/10/2024    CMP  Lab Results  Component Value Date   NA 139 02/20/2024   K 4.3 02/20/2024   CL 101 02/20/2024   CO2 22 02/20/2024   GLUCOSE 117 (H) 02/20/2024   BUN 29 02/20/2024   CREATININE 1.15 02/20/2024   CALCIUM  9.3 02/20/2024   PROT 6.7 02/10/2024   ALBUMIN 4.2 02/10/2024   AST 50 (H) 02/10/2024   ALT 13 02/10/2024   ALKPHOS 64 02/10/2024   BILITOT 3.6 (HH) 02/10/2024   GFRNONAA >60 02/10/2024   GFRAA 77 11/25/2019     Medications: I have reviewed the patient's current medications.   Assessment/Plan: Macrocytic anemia Thrombocytopenia Chronic hyperbilirubinemia Biliary obstruction  01/08/2024 abdominal ultrasound -there was no evidence of acute cholecystitis; dilation of the common bile duct without obstructing lesion was identified.   MRCP 01/10/2024-marked dilation of the common bile duct with mild prominence of the central intrahepatic biliary tree; question of a soft tissue mass along the duodenal wall and distal common duct. ERCP  01/13/2024-no obvious bulb  or second portion of the duodenum mass was seen.  Deep selective cannulization was readily obtained and an obvious short distal stricture was confirmed.  A temporary plastic biliary stent was placed into the common bile duct.  Distal common bile duct brushing showed atypical cells. Hypertension Atrial fibrillation CAD Severe bioprosthetic Eric s/p transseptal transcatheter valve in ring TMVR (26mm Sapien valve) with LAMPOON (12/19/23) at [facility], S/p TAVR in aortic position (2020) 03/02/2024 TEE: Dilated LA and RA, lateral/posterior defect at the mitral valve ring with a regurgitant fraction of 17% History of bladder cancer History of prostate cancer History of multiple types of skin cancer including melanoma Weight loss 2025    Disposition: Eric [name] has stable anemia and hyperbilirubinemia.  He was diagnosed with a paravalvular leak on the TEE 03/02/2024.  Hemolysis likely in part explains the anemia, though the hyperbilirubinemia predates the recent heart valve procedure.  He may have a component of chronic heart valve hemolysis, Gilbert's disease, biliary obstruction, or a bone marrow process contributing to to the laboratory findings.  He was admitted with biliary obstruction in August and underwent placement of a biliary stent.  He does not have symptoms to suggest a malignancy.  The plan is to continue observation.  We will provide red cell transfusion support as needed.  He continues folic acid .  He will return for an office and lab visit in 1 month.  He should follow-up with Dr. Rosalie for management of  the bile duct stent.  [name], MD  03/10/2024  9:56 AM

## 2024-03-20 ENCOUNTER — Encounter: Payer: Self-pay | Admitting: Cardiovascular Disease

## 2024-03-20 ENCOUNTER — Ambulatory Visit: Attending: Cardiovascular Disease | Admitting: Cardiovascular Disease

## 2024-03-20 VITALS — BP 113/66 | HR 58 | Resp 16 | Ht 72.0 in | Wt 165.4 lb

## 2024-03-20 DIAGNOSIS — T8203XD Leakage of heart valve prosthesis, subsequent encounter: Secondary | ICD-10-CM | POA: Diagnosis not present

## 2024-03-20 NOTE — Progress Notes (Signed)
 Cardiology Office Note:    Date:  03/20/2024   ID:  Eric [name], DOB Apr 29, 1930, MRN [id number]  PCP:  Rolinda Millman, MD   [facility] Providers Cardiologist:  Ozell Fell, MD Structural Heart:  Ozell Fell, MD    Referring MD: Rolinda Millman, MD   Chief Complaint  Patient presents with   Severe mitral regurgitation   Follow-up    History of Present Illness:    Eric [name] is a 89 y.o. male presenting for cardiology follow-up.  The patient is here with his wife today.  The patient has an extremely complex cardiac history.  In 2009 he underwent bioprosthetic aortic valve replacement, mitral valve repair, and single-vessel bypass surgery with a saphenous vein graft obtuse marginal.  He later developed bioprosthetic aortic valve failure and was treated with valve in valve TAVR in 2020.  He then developed severe recurrent mitral regurgitation as well as paravalvular regurgitation and underwent transcatheter mitral valve replacement with [place] and closure of a paravalvular leak on December 19, 2023 at [facility] in [place], [place] .  Unfortunately, he later developed jaundice and was noted to have elevated bilirubin and LFTs.  An MRCP demonstrated marked bile duct dilatation and a soft tissue mass in the patient underwent biliary stenting.  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  He was found to have recurrent paravalvular leak posterior and lateral to the vascular plug and mitral valve.  The patient complains of profound fatigue and tiredness.  He does not have shortness of breath, chest pain, orthopnea, PND, or leg swelling.  He feels like his recent procedures have been pretty difficult on him and he is not sure that he wants to move forward with any other procedures at this point.  He and his wife are very appreciative of the  medical care they have received from all of their providers, specifically Dr. Cloretta and Dr. Rosalie.   Current Medications: No outpatient medications have been marked as taking for the 03/20/24 encounter (Office Visit) with Fell Ozell, MD.     Allergies:   Patient has no known allergies.   ROS:   Please see the history of present illness.    All other systems reviewed and are negative.  EKGs/Labs/Other Studies Reviewed:    The following studies were reviewed today: Cardiac Studies & Procedures   ______________________________________________________________________________________________ CARDIAC CATHETERIZATION  CARDIAC CATHETERIZATION 10/04/2023  Conclusion 1.  Single-vessel coronary artery disease with moderate 75% eccentric stenosis of the proximal to mid circumflex 2.  Continued patency of the SVG to OM1 3.  Widely patent left main, LAD, and RCA with no significant stenoses 4.  Right heart catheterization data: RA mean 6 mmHg RV pressure 47 over 5 mmHg PA pressure 51/16 with a mean of 32 mmHg Pulmonary wedge A-wave 13, V wave 41, mean 22 mmHg Cardiac output 5.8 L/min with cardiac index 2.8 L/min/m  Discussion: Patent with stable coronary anatomy, patent native vessels with moderately severe circumflex stenosis and a patent SVG to OM.  Hemodynamics are pertinent for a large V wave consistent with the patient's severe mitral regurgitation as well as associated moderate pulmonary hypertension.  Transpulmonary gradient is only 10 mmHg with a PVR of 1.7 Wood units.  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  Plan to proceed with CT angiography to continue the patient's evaluation for transcatheter mitral valve and  ring replacement.  Findings Coronary Findings Diagnostic  Dominance: Right  Left Main The left main is patent and divides into the LAD and left circumflex.  The patient has very mild distal  plaquing. Mid LM to Dist LM lesion is 25% stenosed. The lesion is mildly calcified.  Left Anterior Descending Vessel is large. The vessel exhibits minimal luminal irregularities. The LAD courses to the LV apex.  The LAD has mild diffuse plaque but no significant stenosis throughout its distribution.  The first diagonal is large in caliber.  Left Circumflex Prox Cx lesion is 75% stenosed.  Second Obtuse Marginal Branch The circumflex has an eccentric 75% proximal vessel stenosis.  The vein graft to the first OM is widely patent.  Right Coronary Artery There is mild diffuse disease throughout the vessel. The RCA is patent throughout.  The PDA and PLA branches are patent.  There is mild diffuse nonobstructive plaquing in the RCA.  No high-grade stenoses. Mid RCA lesion is 40% stenosed. The lesion is moderately calcified.  Saphenous Graft To 1st Mrg SVG and is normal in caliber.  The graft exhibits no disease. The saphenous vein graft to first OM is widely patent throughout.  There are no significant stenoses.  The graft fills the circumflex in retrograde fashion.  Intervention  No interventions have been documented.   CARDIAC CATHETERIZATION  CARDIAC CATHETERIZATION 10/29/2018  Conclusion 1.  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  Single-vessel coronary artery disease with severe stenosis of the proximal left circumflex and continued patency of the saphenous vein graft to first OM 3.  Patent left main, LAD, and RCA with mild diffuse nonobstructive disease 4.  Normal right heart pressures and preserved cardiac output  Recommend: Continued multidisciplinary team evaluation with CTA studies and formal surgical consultation with Dr. Lucas in anticipation of possible valve-in-valve TAVR  Findings Coronary Findings Diagnostic  Dominance:  Right  Left Main Mid LM to Dist LM lesion is 25% stenosed. The lesion is mildly calcified.  Left Anterior Descending Vessel is large. The vessel exhibits minimal luminal irregularities. The LAD is a large vessel that courses to the LV apex.  The vessel has minor irregularities but no significant stenoses.  The first diagonal is large in caliber.  Left Circumflex Prox Cx lesion is 75% stenosed.  Right Coronary Artery There is mild diffuse disease throughout the vessel. Mid RCA lesion is 40% stenosed. The lesion is moderately calcified.  Saphenous Graft To 1st Mrg SVG graft was visualized by angiography and is normal in caliber. The graft exhibits no disease. The saphenous vein graft to first OM is widely patent throughout.  There are no significant stenoses.  The graft fills the circumflex and retrograde fashion.  Intervention  No interventions have been documented.   STRESS TESTS  MYOCARDIAL PERFUSION IMAGING 10/26/2022  Interpretation Summary   The study is normal. Findings are consistent with no ischemia and no infarction. The study is high risk.   No ST deviation was noted.   LV perfusion is normal. There is no evidence of ischemia. There is no evidence of infarction.   Left ventricular function is normal. Nuclear stress EF: 57 %. The left ventricular ejection fraction is normal (55-65%). End diastolic cavity size is normal. End systolic cavity size is normal.   Prior study available for comparison from 11/02/2010.   ECHOCARDIOGRAM  ECHOCARDIOGRAM COMPLETE 01/30/2024  Narrative ECHOCARDIOGRAM REPORT  Patient Name:   Eric [name] Date of Exam: 01/30/2024 Medical Rec #:  [id number]       Height:       72.0 in Accession #:    [id number]      Weight:       174.0 lb Date of Birth:  Aug 27, 1929       BSA:          2.008 m Patient Age:    89 years        BP:           104/68 mmHg Patient Gender: M               HR:           74 bpm. Exam Location:  [address]  Procedure: 2D Echo, Cardiac Doppler and Color Doppler (Both Spectral and Color Flow Doppler were utilized during procedure).  Indications:    Z95.2 S/p TMVR  History:        Patient has prior history of Echocardiogram examinations, most recent 09/02/2023. S/p TMVR (26mm Sapien 12/19/23), Dilated aortic root; S/p TAVR (23mm Sapien).  Mitral Valve: 26 mm bioprosthetic valve valve is present in the mitral position. Procedure Date: 12/19/2023.  Sonographer:    Elsie Bohr RDCS Referring Phys: [id number] KATHRYN R THOMPSON  IMPRESSIONS   1. 26 mm S3 Edwards Sapien [place] in [place]. Emax 2.4 m/s, MG 8.7 mmHG @ 57 bpm, EOA 1.82 cm2. Normal prosthesis. The mitral valve has been repaired/replaced. No evidence of mitral valve regurgitation. There is a 26 mm bioprosthetic valve present in the mitral position. Procedure Date: 12/19/2023. Echo findings are consistent with normal structure and function of the mitral valve prosthesis. 2. Left ventricular ejection fraction, by estimation, is 65 to 70%. The left ventricle has normal function. The left ventricle has no regional wall motion abnormalities. There is mild asymmetric left ventricular hypertrophy of the basal-septal segment. Left ventricular diastolic function could not be evaluated. 3. Right ventricular systolic function is mildly reduced. The right ventricular size is moderately enlarged. There is normal pulmonary artery systolic pressure. The estimated right ventricular systolic pressure is 34.6 mmHg. 4. Left atrial size was severely dilated. 5. Right atrial size was moderately dilated. 6. Tricuspid valve regurgitation is moderate. 7. 23 mm S3 Valve in Valve TAVR. The aortic valve has been repaired/replaced. Aortic valve regurgitation is trivial. Procedure Date: 11/25/2018. Echo findings are consistent with normal structure and function of the aortic valve prosthesis. Aortic valve area, by VTI measures 2.43 cm. Aortic valve mean gradient  measures 10.4 mmHg. Aortic valve Vmax measures 2.39 m/s. 8. The inferior vena cava is normal in size with greater than 50% respiratory variability, suggesting right atrial pressure of 3 mmHg.  Comparison(s): Changes from prior study are noted. S/p TMVR.  FINDINGS Left Ventricle: Left ventricular ejection fraction, by estimation, is 65 to 70%. The left ventricle has normal function. The left ventricle has no regional wall motion abnormalities. The left ventricular internal cavity size was normal in size. There is mild asymmetric left ventricular hypertrophy of the basal-septal segment. Abnormal (paradoxical) septal motion consistent with post-operative status. Left ventricular diastolic function could not be evaluated due to mitral valve replacement. Left ventricular diastolic function could not be evaluated.  Right Ventricle: The right ventricular size is moderately enlarged. No increase in right ventricular wall thickness. Right ventricular systolic function is mildly reduced. There is normal pulmonary artery systolic pressure. The tricuspid regurgitant velocity is 2.81 m/s, and with an  assumed right atrial pressure of 3 mmHg, the estimated right ventricular systolic pressure is 34.6 mmHg.  Left Atrium: Left atrial size was severely dilated.  Right Atrium: Right atrial size was moderately dilated.  Pericardium: There is no evidence of pericardial effusion.  Mitral Valve: 26 mm S3 Edwards Sapien Vaive in [place]. Emax 2.4 m/s, MG 8.7 mmHG @ 57 bpm, EOA 1.82 cm2. Normal prosthesis. The mitral valve has been repaired/replaced. No evidence of mitral valve regurgitation. There is a 26 mm bioprosthetic valve present in the mitral position. Procedure Date: 12/19/2023. Echo findings are consistent with normal structure and function of the mitral valve prosthesis. MV peak gradient, 22.7 mmHg. The mean mitral valve gradient is 8.7 mmHg with average heart rate of 57 bpm.  Tricuspid Valve: The tricuspid valve  is grossly normal. Tricuspid valve regurgitation is moderate . No evidence of tricuspid stenosis.  Aortic Valve: 23 mm S3 Valve in Valve TAVR. The aortic valve has been repaired/replaced. Aortic valve regurgitation is trivial. Aortic valve mean gradient measures 10.4 mmHg. Aortic valve peak gradient measures 22.8 mmHg. Aortic valve area, by VTI measures 2.43 cm. There is a 23 mm Sapien prosthetic, stented (TAVR) valve present in the aortic position. Echo findings are consistent with normal structure and function of the aortic valve prosthesis.  Pulmonic Valve: The pulmonic valve was grossly normal. Pulmonic valve regurgitation is trivial. No evidence of pulmonic stenosis.  Aorta: The aortic root and ascending aorta are structurally normal, with no evidence of dilitation.  Venous: The inferior vena cava is normal in size with greater than 50% respiratory variability, suggesting right atrial pressure of 3 mmHg.  IAS/Shunts: The atrial septum is grossly normal.   LEFT VENTRICLE PLAX 2D LVIDd:         4.40 cm   Diastology LVIDs:         2.90 cm   LV e' medial:    5.30 cm/s LV PW:         1.10 cm   LV E/e' medial:  41.2 LV IVS:        1.30 cm   LV e' lateral:   9.90 cm/s LVOT diam:     2.21 cm   LV E/e' lateral: 22.1 LV SV:         111 LV SV Index:   55 LVOT Area:     3.84 cm   RIGHT VENTRICLE            IVC RVSP:           34.6 mmHg  IVC diam: 1.50 cm  LEFT ATRIUM              Index        RIGHT ATRIUM           Index LA diam:        4.70 cm  2.34 cm/m   RA Pressure: 3.00 mmHg LA Vol (A2C):   70.8 ml  35.26 ml/m  RA Area:     22.70 cm LA Vol (A4C):   100.0 ml 49.80 ml/m  RA Volume:   69.60 ml  34.66 ml/m LA Biplane Vol: 86.9 ml  43.27 ml/m AORTIC VALVE AV Area (Vmax):    2.12 cm AV Area (Vmean):   2.36 cm AV Area (VTI):     2.43 cm AV Vmax:           238.80 cm/s AV Vmean:          145.800 cm/s AV VTI:  0.458 m AV Peak Grad:      22.8 mmHg AV Mean Grad:       10.4 mmHg LVOT Vmax:         132.20 cm/s LVOT Vmean:        89.640 cm/s LVOT VTI:          0.290 m LVOT/AV VTI ratio: 0.63  AORTA Ao Asc diam: 3.90 cm  MITRAL VALVE                TRICUSPID VALVE MV Area (PHT): 1.61 cm     TR Peak grad:   31.6 mmHg MV Area VTI:   1.82 cm     TR Vmax:        281.00 cm/s MV Peak grad:  22.7 mmHg    Estimated RAP:  3.00 mmHg MV Mean grad:  8.7 mmHg     RVSP:           34.6 mmHg MV Vmax:       2.38 m/s MV Vmean:      131.0 cm/s   SHUNTS MV VTI:        0.61 m       Systemic VTI:  0.29 m MV Decel Time: 472 msec     Systemic Diam: 2.21 cm MV E velocity: 218.67 cm/s  Darryle Decent MD Electronically signed by Darryle Decent MD Signature Date/Time: 01/30/2024/6:09:07 PM    Final   TEE  ECHO TEE 03/02/2024  Narrative TRANSESOPHOGEAL ECHO REPORT    Patient Name:   Eric [name] Date of Exam: 03/02/2024 Medical Rec #:  [id number]       Height:       72.0 in Accession #:    [id number]      Weight:       166.9 lb Date of Birth:  August 01, 1929       BSA:          1.973 m Patient Age:    89 years        BP:           138/96 mmHg Patient Gender: M               HR:           80 bpm. Exam Location:  Inpatient  Procedure: Transesophageal Echo, 3D Echo, Cardiac Doppler and Color Doppler (Both Spectral and Color Flow Doppler were utilized during procedure).  Indications:     I34.0 Nonrheumatic mitral (valve) insufficiency  History:         Patient has prior history of Echocardiogram examinations, most recent 01/30/2024. Arrythmias:Atrial Fibrillation; Risk Factors:Dyslipidemia. Aortic Valve: 23 mm Edwards Sapien prosthetic, stented (TAVR) valve is present in the aortic position. Procedure Date: 11/25/2018. Mitral Valve: 26 mm Edwards Sapien bioprosthetic valve valve is present in the mitral position. Procedure Date: 12/19/23.  Sonographer:     Damien Senior RDCS Referring Phys:  [id number] LAMARR JONELLE HUMMER Diagnosing Phys: Jerel Balding  MD  PROCEDURE: After discussion of the risks and benefits of a TEE, an informed consent was obtained from the patient. The transesophogeal probe was passed without difficulty through the esophogus of the patient. Imaged were obtained with the patient in a left lateral decubitus position. Sedation performed by different physician. The patient was monitored while under deep sedation. The patient developed no complications during the procedure.  IMPRESSIONS   1. Left ventricular ejection fraction, by estimation, is 60 to 65%. The left ventricle has normal function. 2. Right ventricular  systolic function is normal. The right ventricular size is severely enlarged. 3. LAA appears surgically oversewn but is still patent (slit like communication is seen towards the anterior mitral annulus) with no evidence of thrombus. Left atrial size was severely dilated. No left atrial/left atrial appendage thrombus was detected. 4. Right atrial size was severely dilated. 5. Valve-in-ring TMVR with vascular plug at the anterolateral aspect (10 o'clock), with a residual defect lateral and posterior to the plug. The defect is roughly 5-6 mm in diameter at its broadest point. Using the PISA method, the effective regurgitant orifice area is 0.18 cm sq, regurgitant volume 20 ml, regurgitant fraction 17%. The mitral valve has been repaired/replaced. Mild to moderate mitral valve regurgitation. Moderate mitral stenosis. The mean mitral valve gradient is 11.0 mmHg with average heart rate of 75 bpm. There is a 26 mm Edwards Sapien bioprosthetic valve present in the mitral position. Procedure Date: 12/19/23. Echo findings are consistent with perivalvular leak the mitral prosthesis. 6. The tricuspid valve is myxomatous. 7. Valve-in-valve TAVR. The aortic valve has been repaired/replaced. Aortic valve regurgitation is not visualized. There is a 23 mm Edwards Sapien prosthetic (TAVR) valve present in the aortic position. Procedure  Date: 11/25/2018. Echo findings are consistent with normal structure and function of the aortic valve prosthesis. Aortic valve mean gradient measures 14.0 mmHg. Aortic valve Vmax measures 2.40 m/s. 8. Evidence of atrial level shunting detected by color flow Doppler. 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 9. 3D performed of the mitral valve and demonstrates periannular mitral regurgitation.  FINDINGS Left Ventricle: Left ventricular ejection fraction, by estimation, is 60 to 65%. The left ventricle has normal function. The left ventricular internal cavity size was normal in size. There is no left ventricular hypertrophy.  Right Ventricle: The right ventricular size is severely enlarged. No increase in right ventricular wall thickness. Right ventricular systolic function is normal.  Left Atrium: LAA appears surgically oversewn but is still patent (slit like communication is seen towards the anterior mitral annulus) with no evidence of thrombus. Left atrial size was severely dilated. No left atrial/left atrial appendage thrombus was detected.  Right Atrium: Right atrial size was severely dilated.  Pericardium: There is no evidence of pericardial effusion.  Mitral Valve: Valve-in-ring TMVR with vascular plug at the anterolateral aspect (10 o'clock), with a residual defect lateral and posterior to the plug. The defect is roughly 5-6 mm in diameter at its broadest point. Using the PISA method, the effective regurgitant orifice area is 0.18 cm sq, regurgitant volume 20 ml, regurgitant fraction 17%. The mitral valve has been repaired/replaced. Mild to moderate mitral valve regurgitation. There is a 26 mm Edwards Sapien bioprosthetic valve present in the mitral position. Procedure Date: 12/19/23. Echo findings are consistent with perivalvular leak the mitral  prosthesis. Moderate mitral valve stenosis. MV peak gradient, 20.6 mmHg. The mean mitral valve gradient is 11.0 mmHg with average heart rate of 75 bpm.  Tricuspid Valve: The tricuspid valve is myxomatous. Tricuspid valve regurgitation is mild. There is mild prolapse of the tricuspid.  Aortic Valve: Valve-in-valve TAVR. The aortic valve has been repaired/replaced. Aortic valve regurgitation is not visualized. Aortic valve mean gradient measures 14.0 mmHg. Aortic valve peak gradient measures 23.0 mmHg. Aortic valve area, by VTI measures 1.65 cm. 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 Procedure Date: 11/25/2018.  Echo findings are consistent with normal structure and function of the aortic valve prosthesis.  Pulmonic Valve: The pulmonic valve was grossly normal. Pulmonic valve regurgitation is trivial.  Aorta: The aortic root and ascending aorta are structurally normal, with no evidence of dilitation.  IAS/Shunts: Evidence of atrial level shunting detected by color flow Doppler. 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  Additional Comments: Spectral Doppler performed.  LEFT VENTRICLE PLAX 2D LVOT diam:     2.00 cm LV SV:         95 LV SV Index:   48 LVOT Area:     3.14 cm   AORTIC VALVE AV Area (Vmax):    1.68 cm AV Area (Vmean):   1.79 cm AV Area (VTI):     1.65 cm AV Vmax:           240.00 cm/s AV Vmean:          171.000 cm/s AV VTI:            0.572 m AV Peak Grad:      23.0 mmHg AV Mean Grad:      14.0 mmHg LVOT Vmax:         128.00 cm/s LVOT Vmean:        97.700 cm/s LVOT VTI:          0.301 m LVOT/AV VTI ratio: 0.53  AORTA Ao Asc diam: 3.58 cm  MITRAL VALVE               TRICUSPID VALVE MV Area (PHT): 1.88 cm    TR Peak grad:   22.5 mmHg MV Peak grad:  20.6 mmHg   TR  Vmax:        237.00 cm/s MV Mean grad:  11.0 mmHg MV Vmax:       2.27 m/s    SHUNTS MV Vmean:      155.0 cm/s  Systemic VTI:  0.30 m MV Decel Time: 402 msec    Systemic Diam: 2.00 cm  Jerel Croitoru MD Electronically signed by Jerel Balding MD Signature Date/Time: 03/02/2024/6:17:52 PM    Final    CT SCANS  CT CORONARY MORPH W/CTA COR W/SCORE 10/22/2023  Addendum 10/23/2023 10:14 PM ADDENDUM REPORT: 10/23/2023 22:12  EXAM: OVER-READ INTERPRETATION  CT CHEST  The following report is an over-read performed by radiologist Dr. Fonda Mom [facility] on 10/23/2023. This over-read does not include interpretation of cardiac or coronary anatomy or pathology. The coronary CTA interpretation by the cardiologist is attached.  COMPARISON:  06/29/2023.  FINDINGS: Cardiovascular:  See findings discussed in the body of the report.  Mediastinum/Nodes: No suspicious adenopathy identified. Imaged mediastinal structures are unremarkable.  Lungs/Pleura: Pulmonary interstitial changes consistent with scarring at the bases. Small right-sided pleural effusion.  Upper Abdomen: No acute abnormality.  Musculoskeletal: No chest wall abnormality. No acute osseous findings.  IMPRESSION: Small right-sided pleural effusion.   Electronically Signed By: Fonda Field M.D. On: 10/23/2023 22:12  Narrative CLINICAL DATA:  Prior AVR/TAVR with Mitral Valve repair  Pre Mitral Valve in Ring Assessment  Severe MR  MEDICATIONS: No meds given  EXAM: Gated cardiac CTA  TECHNIQUE: The patient was scanned on a GE apex scanner. 1 beat acquisition triggered in the descending thoracic aorta at 110 HU's. A non contrast, gated CT scan was obtained first with axial slices of 2.5 mm through  the heart for valve scoring. A 120 kV retrospective, gated, contrast scan done with gantry rotation speed of 230 msec and collimation 0.63 mm. A delayed scan was obtained to exclude  LAA thrombus. The 3D data set was reconstructed in 5% intervals of the R-R cycle. Best systolic phase was motion corrected Images were analyzed on a dedicated workstation using MPR, MIP and VRT modes The patient received 100 cc of contrast  FINDINGS: Severe bi atrial enlargement. Large wind sock appendage has not been surgically occluded. Cannot r/o thrombus in mid/apical appendage vs mixing artifact No delayed imaging performed. However non contrast imaging for calcium  scoring showed no apparent thrombus in the LAA. There is no ASD/PFO. The valve in bioprosthetic TAVR is well positioned with no PVL and no HALT/HAM on leaflets. The native MV leaflets appear thickened with some posterior leaflet prolapse. There is an area of severe bulky calcification below the posterior annulus/chords measuring 8.8 mm x 7.3 mm. The is an area of PVL around the MV ring laterally measuring 6.2 mm x 4.9 mm with average diameter 5.5 mm, area 0.24 cm2 and perimeter of 17.6 mm.  The patient has a 28 mm Sorin mitral annuloplasty ring implanted 2009 2D dimensions below  Diastole  TT Distance 23 mm  SL Distance 16.4 mm  IC Distance 26.3 mm  Systole  TT Distance 20.7 mm  SL Distance 16 mm  IC Distance 25.5 mm  The mitral plane aortic angle is 120 degrees  The aortic root anlge is 47 degrees  Using the best diastolic phase and a 26 mm valve model the Neo LVOT measures 436 mm2  Using the best systolic phase and a 26 mm valve model the Neo LVOT measures 427 mm2  Prior AVR with bioprosthetic AVR 23 mm Mitroflow and subsequent TAVR using 23 mm Sapien 3 valve No HALT/HAM noted  No PVL noted.  Mild ascending thoracic aorta dilatation 3.8 cm  There is a patent SVG to the OM branch.  IMPRESSION: 1. 28 mm Sorin annuloplasty ring with native posterior leaflet prolapse. PVL noted anterior and laterally with area of 0.24 cm2  2. Neo LVOT 436 mm2 in diastole and 427 mm2 in systole modeled with a  26 mm Sapien valve  3. Normal appearing valve in valve AV with 23 mm Sapien 3 valve No PVL No HALT/HAM  4.  Patent SVG to OM  5. Mitral aortic angle of 120 degrees. With aortic root angle 47 degrees  6. Windsock appendage with mixing artifact on contrast images. No apparent thrombus on non contrast images  7.  Severe bi atrial enlargement  8.  No ASD/PFO  9.  Mild dilatation of the ascending thoracic aorta 3.8 cm  10. Area of dense calcification sub-annular to Sorin ring involving posterior chords measuring 8.8 mm x 7.3 mm  Maude Emmer  Electronically Signed: By: Maude Emmer M.D. On: 10/22/2023 12:48   CT SCANS  CT CORONARY MORPH W/CTA COR W/SCORE 11/03/2018  Addendum 11/03/2018  5:32 PM ADDENDUM REPORT: 11/03/2018 17:29  EXAM: OVER-READ INTERPRETATION  CT CHEST  The following report is an over-read performed by radiologist Dr. Selinda Saas [facility] on 11/03/2018. This over-read does not include interpretation of cardiac or coronary anatomy or pathology. The cardiac CTA interpretation by the cardiologist is attached.  COMPARISON:  11/11/2006 chest CT angiogram.  FINDINGS: Please see the separate concurrent chest CT angiogram report for details.  IMPRESSION: Please see the separate concurrent chest CT angiogram report for details.  Electronically Signed By: Selinda DELENA Blue M.D. On: 11/03/2018 17:29  Narrative CLINICAL DATA:  AVR Pre Valve in Valve TAVR  EXAM: Cardiac TAVR CT  TECHNIQUE: The patient was scanned on a Siemens Force 192 slice scanner. A 120 kV retrospective scan was triggered in the ascending thoracic aorta at 140 HU's. Gantry rotation speed was 250 msecs and collimation was .6 mm. No beta blockade or nitro were given. The 3D data set was reconstructed in 5% intervals of the R-R cycle. Systolic and diastolic phases were analyzed on a dedicated work station using MPR, MIP and VRT modes. The patient received 80 cc of  contrast.  FINDINGS: Aortic Valve: There is a 23 mm Mitroflow stented pericardial bioprosthetic in place. Sewing ring appears intact with no areas of dehiscence The leaflets are thickened and there appears to be prolapse of the right coronary cusp.  Aorta: Moderate calcific atherosclerosis No aneurysm. Normal arch vessels  Sino-tubular Junction: 29 mm  Ascending Thoracic Aorta: 36 mm  Aortic Arch: 29 mm  Descending Thoracic Aorta: 24 mm  Sinus of Valsalva Measurements:  Non-coronary: 29.4 mm  Right - coronary: 28 mm  Left -   coronary: 30.6 mm  Coronary Artery Height above Annulus:  Left Main: 10.6 mm above annulus  Right Coronary: 10.9 mm above annulus  Valve ID: 19.2 mm  Area inside sewing ring 334 mm2  There is a patent SVG to the OM branch. The patient has had a mitral valve repair with annuloplasty ring. The LAA was supposed to have been ligated but appears patent with no obvious thrombus  IMPRESSION: 1. 23 mm Mitroflow stented pericardial tissue valve with thickened leaflets and possible prolapse of right cusp.  2. On SA basal view LM is 7.3 mm from valve leaflet border and RCA is 7.4 mm away However there is concern for the coronary heights Even a 20 mm Sapein 3 is 14 mm in height  The RCA appears to rest behind the right leaflet with STJ calcium  and may be at higher risk of occlusion  3.  Normal aortic root 3.6 cm  4.  Post mitral valve repair with annuloplasty ring  5.  LAA is patent Surgical note indicates ligation  6.  Patent SVG to OM  Maude Emmer  Electronically Signed: By: Maude Emmer M.D. On: 11/03/2018 15:16     ______________________________________________________________________________________________      EKG:        Recent Labs: 06/29/2023: B Natriuretic Peptide 225.9 08/05/2023: NT-Pro BNP 966 01/13/2024: Magnesium  1.8 03/10/2024: ALT 9; BUN 24; Creatinine 1.13; Hemoglobin 10.4; Platelet Count 131; Potassium 4.4;  Sodium 136  Recent Lipid Panel    Component Value Date/Time   CHOL 127 11/25/2020 0258   CHOL 130 04/27/2019 0840   TRIG 57 11/25/2020 0258   HDL 50 11/25/2020 0258   HDL 63 04/27/2019 0840   CHOLHDL 2.5 11/25/2020 0258   VLDL 11 11/25/2020 0258   LDLCALC 66 11/25/2020 0258   LDLCALC 53 04/27/2019 0840     Risk Assessment/Calculations:    CHA2DS2-VASc Score = 7   This indicates a 11.2% annual risk of stroke. The patient's score is based upon: CHF History: 1 HTN History: 1 Diabetes History: 0 Stroke History: 2 Vascular Disease History: 1 Age Score: 2 Gender Score: 0               Physical Exam:    VS:  BP 113/66 (BP Location: Left Arm, Patient Position: Sitting, Cuff Size: Normal)   Pulse ROLLEN)  58   Resp 16   Ht 6' (1.829 m)   Wt 165 lb 6.4 oz (75 kg)   SpO2 95%   BMI 22.43 kg/m     Wt Readings from Last 3 Encounters:  03/20/24 165 lb 6.4 oz (75 kg)  03/10/24 168 lb 12.8 oz (76.6 kg)  03/02/24 166 lb (75.3 kg)     GEN: Elderly male, in a wheelchair today, in no acute distress HEENT: Normal NECK: No JVD; No carotid bruits LYMPHATICS: No lymphadenopathy CARDIAC: RRR, 2/6 systolic murmur at the cardiac apex RESPIRATORY:  Clear to auscultation without rales, wheezing or rhonchi  ABDOMEN: Soft, non-tender, non-distended MUSCULOSKELETAL:  No edema; No deformity  SKIN: Warm and dry NEUROLOGIC:  Alert and oriented x 3 PSYCHIATRIC:  Normal affect   Assessment & Plan Paravalvular leak (prosthetic valve), subsequent encounter Reviewed the patient's clinical situation, personally reviewed his recent transesophageal echo imaging, and had a lengthy discussion with the patient and his wife about treatment options.  He previously had very severe mitral regurgitation that has been well treated with leaflet modification and valve in ring transcatheter mitral valve replacement.  He has residual paravalvular regurgitation after undergoing PVL closure at the time of his  recent procedure.  The paravalvular leak is well-demonstrated on his transesophageal echo imaging and it is located just lateral and posterior to the closure device.  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  I think it would be reasonable to consider paravalvular leak closure and closure of his iatrogenic ASD with a transcatheter procedure under transesophageal echo guidance.  The primary indication for the procedure would be to treat hemolysis.  I do not think his paravalvular leak is hemodynamically significant enough to cause him problems with heart failure or symptoms related to mitral regurgitation.  The patient's medical situation is more complicated by the presence of common bile duct obstruction and recent placement of a biliary stent.  I discussed his case with Dr. Cloretta and we reviewed the fact that his recent hemoglobin levels are stable.  Considering the patient's reluctance about further procedures, it might be reasonable to continue with clinical surveillance of his hemolysis and ongoing evaluation of his biliary abnormality.  I have also reached out to Dr. Rosalie and will personally discussed the patient's case with him.  The patient's cardiac situation is stable enough that he could undergo another GI procedure if this was necessary.  Again, I think paravalvular leak closure would be technically feasible if his hemolysis worsens and if it is felt to be clinically indicated in the context of his other medical problems.  I did not make any changes to his medical regimen today.  The patient will continue to undergo clinical surveillance through his upcoming follow-up in gastroenterology and hematology/oncology.             Medication Adjustments/Labs and Tests Ordered: Current medicines are reviewed at length with the patient today.  Concerns regarding medicines are outlined above.  No orders of the defined types were placed in this encounter.  No orders of the  defined types were placed in this encounter.   There are no Patient Instructions on file for this visit.   Signed, Ozell Fell, MD  03/20/2024 11:18 AM    [facility]

## 2024-03-20 NOTE — Patient Instructions (Signed)
 Medication Instructions:  No medication changes were made at this visit. Continue current regimen.   *If you need a refill on your cardiac medications before your next appointment, please call your pharmacy*  Lab Work: None ordered today. If you have labs (blood work) drawn today and your tests are completely normal, you will receive your results only by: MyChart Message (if you have MyChart) OR A paper copy in the mail If you have any lab test that is abnormal or we need to change your treatment, we will call you to review the results.  Testing/Procedures: None ordered today.  Follow-Up: At [facility], you and your health needs are our priority.  As part of our continuing mission to provide you with exceptional heart care, our providers are all part of one team.  This team includes your primary Cardiologist (physician) and Advanced Practice Providers or APPs (Physician Assistants and Nurse Practitioners) who all work together to provide you with the care you need, when you need it.  Your next appointment:   Our office will be in touch

## 2024-03-21 ENCOUNTER — Encounter: Payer: Self-pay | Admitting: Cardiovascular Disease

## 2024-04-01 ENCOUNTER — Other Ambulatory Visit: Payer: Self-pay | Admitting: Cardiovascular Disease

## 2024-04-01 DIAGNOSIS — I25118 Atherosclerotic heart disease of native coronary artery with other forms of angina pectoris: Secondary | ICD-10-CM

## 2024-04-03 MED ORDER — NITROGLYCERIN 0.4 MG SL SUBL: 0.4000 mg | SUBLINGUAL_TABLET | SUBLINGUAL | 3 refills | Status: AC | PRN

## 2024-04-06 ENCOUNTER — Ambulatory Visit: Admitting: Cardiovascular Disease

## 2024-04-07 ENCOUNTER — Telehealth: Payer: Self-pay

## 2024-04-07 NOTE — Telephone Encounter (Signed)
 Spoke with Ms. Cleavenger. The patient has appointments with Dr. Rosalie and Dr. Cloretta tomorrow.  She understood Dr. Wonda said it is OK from a cardiac perspective to proceed with any testing the other specialists deem necessary.  Will call the [facility] Thursday AM for an update. Will schedule Mr. Warmoth for 3 month visit with Dr. Wonda at that time.

## 2024-04-08 ENCOUNTER — Other Ambulatory Visit: Payer: Self-pay | Admitting: Gastroenterology

## 2024-04-08 ENCOUNTER — Inpatient Hospital Stay: Attending: Nurse Practitioner

## 2024-04-08 ENCOUNTER — Inpatient Hospital Stay: Admitting: Oncology

## 2024-04-08 VITALS — BP 111/66 | HR 64 | Temp 97.8°F | Resp 18 | Ht 72.0 in | Wt 165.1 lb

## 2024-04-08 DIAGNOSIS — D649 Anemia, unspecified: Secondary | ICD-10-CM | POA: Diagnosis not present

## 2024-04-08 DIAGNOSIS — K831 Obstruction of bile duct: Secondary | ICD-10-CM | POA: Diagnosis not present

## 2024-04-08 DIAGNOSIS — D696 Thrombocytopenia, unspecified: Secondary | ICD-10-CM | POA: Insufficient documentation

## 2024-04-08 LAB — CMP (CANCER CENTER ONLY)
ALT: 10 U/L (ref 0–44)
AST: 57 U/L — ABNORMAL HIGH (ref 15–41)
Albumin: 4 g/dL (ref 3.5–5.0)
Alkaline Phosphatase: 71 U/L (ref 38–126)
Anion gap: 10 (ref 5–15)
BUN: 33 mg/dL — ABNORMAL HIGH (ref 8–23)
CO2: 26 mmol/L (ref 22–32)
Calcium: 10.1 mg/dL (ref 8.9–10.3)
Chloride: 103 mmol/L (ref 98–111)
Creatinine: 1.16 mg/dL (ref 0.61–1.24)
GFR, Estimated: 59 mL/min — ABNORMAL LOW (ref >60)
Glucose, Bld: 108 mg/dL — ABNORMAL HIGH (ref 70–99)
Potassium: 4.3 mmol/L (ref 3.5–5.1)
Sodium: 139 mmol/L (ref 135–145)
Total Bilirubin: 4.4 mg/dL (ref 0.0–1.2)
Total Protein: 7.8 g/dL (ref 6.5–8.1)

## 2024-04-08 LAB — RETICULOCYTES
Immature Retic Fract: 18.5 % — ABNORMAL HIGH (ref 2.3–15.9)
RBC.: 3.58 MIL/uL — ABNORMAL LOW (ref 4.22–5.81)
Retic Count, Absolute: 100.2 K/uL (ref 19.0–186.0)
Retic Ct Pct: 2.8 % (ref 0.4–3.1)

## 2024-04-08 LAB — LACTATE DEHYDROGENASE: LDH: 1070 U/L — ABNORMAL HIGH (ref 105–235)

## 2024-04-08 LAB — CBC WITH DIFFERENTIAL (CANCER CENTER ONLY)
Abs Immature Granulocytes: 0.01 K/uL (ref 0.00–0.07)
Basophils Absolute: 0.1 K/uL (ref 0.0–0.1)
Basophils Relative: 1 %
Eosinophils Absolute: 0.1 K/uL (ref 0.0–0.5)
Eosinophils Relative: 2 %
HCT: 33.8 % — ABNORMAL LOW (ref 39.0–52.0)
Hemoglobin: 10.6 g/dL — ABNORMAL LOW (ref 13.0–17.0)
Immature Granulocytes: 0 %
Lymphocytes Relative: 29 %
Lymphs Abs: 1.3 K/uL (ref 0.7–4.0)
MCH: 30.1 pg (ref 26.0–34.0)
MCHC: 31.4 g/dL (ref 30.0–36.0)
MCV: 96 fL (ref 80.0–100.0)
Monocytes Absolute: 0.5 K/uL (ref 0.1–1.0)
Monocytes Relative: 12 %
Neutro Abs: 2.6 K/uL (ref 1.7–7.7)
Neutrophils Relative %: 56 %
Platelet Count: 143 K/uL — ABNORMAL LOW (ref 150–400)
RBC: 3.52 MIL/uL — ABNORMAL LOW (ref 4.22–5.81)
RDW: 14.6 % (ref 11.5–15.5)
WBC Count: 4.6 K/uL (ref 4.0–10.5)
nRBC: 0 % (ref 0.0–0.2)

## 2024-04-08 LAB — SAMPLE TO BLOOD BANK

## 2024-04-08 NOTE — Progress Notes (Signed)
 CRITICAL VALUE STICKER  CRITICAL VALUE:Total bilirubin 4.4   RECEIVER (on-site recipient of call):Kimerly Rowand M  DATE & TIME NOTIFIED: 04/08/24 1005 am  MESSENGER (representative from lab):A.A.  MD NOTIFIED: GBS  TIME OF NOTIFICATION:  RESPONSE:

## 2024-04-08 NOTE — Progress Notes (Signed)
 [facility] OFFICE PROGRESS NOTE   Diagnosis: Anemia, hyperbilirubinemia  INTERVAL HISTORY:   Eric [name] returns as scheduled.  He has occasional brief episodes of bilateral abdominal discomfort.  No other complaint.  No dyspnea.  Is scheduled to see Dr. Rosalie today.  Objective:  Vital signs in last 24 hours:  Blood pressure 111/66, pulse 64, temperature 97.8 F (36.6 C), temperature source Temporal, resp. rate 18, height 6' (1.829 m), weight 165 lb 1.6 oz (74.9 kg), SpO2 100%.    Lymphatics: No cervical, supraclavicular, axillary, or inguinal nodes Resp: Lungs clear bilaterally Cardio: Irregular GI: No hepatosplenomegaly, no mass, nontender Vascular: No leg edema  Skin: Jaundice  Lab Results:  Lab Results  Component Value Date   WBC 4.6 04/08/2024   HGB 10.6 (L) 04/08/2024   HCT 33.8 (L) 04/08/2024   MCV 96.0 04/08/2024   PLT 143 (L) 04/08/2024   NEUTROABS 2.6 04/08/2024    CMP  Lab Results  Component Value Date   NA 139 04/08/2024   K 4.3 04/08/2024   CL 103 04/08/2024   CO2 26 04/08/2024   GLUCOSE 108 (H) 04/08/2024   BUN 33 (H) 04/08/2024   CREATININE 1.16 04/08/2024   CALCIUM  10.1 04/08/2024   PROT 7.8 04/08/2024   ALBUMIN 4.0 04/08/2024   AST 57 (H) 04/08/2024   ALT 10 04/08/2024   ALKPHOS 71 04/08/2024   BILITOT 4.4 (HH) 04/08/2024   GFRNONAA 59 (L) 04/08/2024   GFRAA 77 11/25/2019    No results found for: CEA1, CEA, CAN199, CA125  Lab Results  Component Value Date   INR 1.5 (H) 01/11/2024   LABPROT 18.6 (H) 01/11/2024    Imaging:  No results found.  Medications: I have reviewed the patient's current medications.   Assessment/Plan: Macrocytic anemia Thrombocytopenia Chronic hyperbilirubinemia Biliary obstruction  01/08/2024 abdominal ultrasound -there was no evidence of acute cholecystitis; dilation of the common bile duct without obstructing lesion was identified.   MRCP 01/10/2024-marked dilation of the  common bile duct with mild prominence of the central intrahepatic biliary tree; question of a soft tissue mass along the duodenal wall and distal common duct. ERCP  01/13/2024-no obvious bulb or second portion of the duodenum mass was seen.  Deep selective cannulization was readily obtained and an obvious short distal stricture was confirmed.  A temporary plastic biliary stent was placed into the common bile duct.  Distal common bile duct brushing showed atypical cells. Hypertension Atrial fibrillation CAD Severe bioprosthetic Eric s/p transseptal transcatheter valve in ring TMVR (26mm Sapien valve) with [facility] (12/19/23) at [facility], S/p TAVR in aortic position (2020) 03/02/2024 TEE: Dilated LA and RA, lateral/posterior defect at the mitral valve ring with a regurgitant fraction of 17% History of bladder cancer History of prostate cancer History of multiple types of skin cancer including melanoma Weight loss 2025     Disposition: Eric [name] has stable anemia and hyperbilirubinemia.  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  He has been evaluated by Dr. Wonda with the plan to observe the prosthetic valve.  He has a history of obstructive jaundice.  A biliary stent is in place.  He is scheduled to see Dr. Rosalie today to discuss the indication for removal of the biliary stent.  He does not have clinical evidence of a malignancy.  He continues folic acid .  He will return for an office and lab visit in 2-3 months.  Eric Hof, MD  04/08/2024  10:37 AM

## 2024-04-09 NOTE — Telephone Encounter (Signed)
 Patient's spouse is returning phone call in regard to this matter. Please advise.

## 2024-04-09 NOTE — Telephone Encounter (Signed)
 Spoke with Eric [name] at length. She said both visits yesterday went well. Eric [name] does not need a blood infusion and is scheduled for an ERCP 12/2.  Scheduled Eric [name] for visit with Dr. Wonda 06/26/2024.  Per Dr. Wonda, sent note OK'ing patient for PT.   Eric [name] was grateful for call and agreed with plan.

## 2024-04-14 ENCOUNTER — Encounter (HOSPITAL_COMMUNITY): Payer: Self-pay | Admitting: Gastroenterology

## 2024-04-16 ENCOUNTER — Other Ambulatory Visit: Payer: Self-pay | Admitting: Cardiovascular Disease

## 2024-04-21 ENCOUNTER — Encounter (HOSPITAL_COMMUNITY)
Admission: RE | Disposition: A | Discharge status: Home / Self Care | Payer: Self-pay | Source: Home / Self Care | Attending: Gastroenterology

## 2024-04-21 ENCOUNTER — Other Ambulatory Visit: Payer: Self-pay

## 2024-04-21 ENCOUNTER — Encounter (HOSPITAL_COMMUNITY): Admitting: Anesthesiology

## 2024-04-21 ENCOUNTER — Ambulatory Visit (HOSPITAL_COMMUNITY): Admitting: Anesthesiology

## 2024-04-21 ENCOUNTER — Encounter (HOSPITAL_COMMUNITY): Payer: Self-pay | Admitting: Gastroenterology

## 2024-04-21 ENCOUNTER — Ambulatory Visit (HOSPITAL_COMMUNITY)

## 2024-04-21 ENCOUNTER — Ambulatory Visit (HOSPITAL_COMMUNITY)
Admission: RE | Admit: 2024-04-21 | Discharge: 2024-04-21 | Disposition: A | Discharge status: Home / Self Care | Attending: Gastroenterology | Admitting: Gastroenterology

## 2024-04-21 DIAGNOSIS — I110 Hypertensive heart disease with heart failure: Secondary | ICD-10-CM

## 2024-04-21 DIAGNOSIS — I5033 Acute on chronic diastolic (congestive) heart failure: Secondary | ICD-10-CM | POA: Diagnosis not present

## 2024-04-21 DIAGNOSIS — I2510 Atherosclerotic heart disease of native coronary artery without angina pectoris: Secondary | ICD-10-CM | POA: Diagnosis not present

## 2024-04-21 DIAGNOSIS — K831 Obstruction of bile duct: Secondary | ICD-10-CM

## 2024-04-21 HISTORY — PX: STENT REMOVAL: SHX6421

## 2024-04-21 HISTORY — PX: BILIARY BRUSHING: SHX6843

## 2024-04-21 HISTORY — PX: BILIARY STENT PLACEMENT: SHX5538

## 2024-04-21 HISTORY — PX: ERCP: SHX5425

## 2024-04-21 SURGERY — ERCP, WITH INTERVENTION IF INDICATED
Anesthesia: General

## 2024-04-21 MED ORDER — GLUCAGON HCL RDNA (DIAGNOSTIC) 1 MG IJ SOLR
INTRAMUSCULAR | Status: AC
  Filled 2024-04-21: qty 2

## 2024-04-21 MED ORDER — FENTANYL CITRATE (PF) 100 MCG/2ML IJ SOLN
INTRAMUSCULAR | Status: DC | PRN
  Administered 2024-04-21: 50 ug via INTRAVENOUS

## 2024-04-21 MED ORDER — SUGAMMADEX SODIUM 200 MG/2ML IV SOLN
INTRAVENOUS | Status: DC | PRN
  Administered 2024-04-21: 200 mg via INTRAVENOUS

## 2024-04-21 MED ORDER — SODIUM CHLORIDE 0.9 % IV SOLN: INTRAVENOUS | Status: DC

## 2024-04-21 MED ORDER — CIPROFLOXACIN IN D5W 400 MG/200ML IV SOLN
INTRAVENOUS | Status: DC | PRN
  Administered 2024-04-21: 400 mg via INTRAVENOUS

## 2024-04-21 MED ORDER — SODIUM CHLORIDE 0.9 % IV SOLN
INTRAVENOUS | Status: DC | PRN
  Administered 2024-04-21: 15 mL

## 2024-04-21 MED ORDER — CIPROFLOXACIN IN D5W 400 MG/200ML IV SOLN
INTRAVENOUS | Status: AC
  Filled 2024-04-21: qty 200

## 2024-04-21 MED ORDER — PROPOFOL 10 MG/ML IV BOLUS
INTRAVENOUS | Status: DC | PRN
  Administered 2024-04-21: 50 mg via INTRAVENOUS
  Administered 2024-04-21: 80 mg via INTRAVENOUS
  Administered 2024-04-21: 85 ug/kg/min via INTRAVENOUS

## 2024-04-21 MED ORDER — PHENYLEPHRINE HCL-NACL 20-0.9 MG/250ML-% IV SOLN
INTRAVENOUS | Status: DC | PRN
  Administered 2024-04-21: 15 ug/min via INTRAVENOUS

## 2024-04-21 MED ORDER — DEXAMETHASONE SOD PHOSPHATE PF 10 MG/ML IJ SOLN
INTRAMUSCULAR | Status: DC | PRN
  Administered 2024-04-21: 8 mg via INTRAVENOUS

## 2024-04-21 MED ORDER — PROPOFOL 10 MG/ML IV BOLUS
INTRAVENOUS | Status: AC
  Filled 2024-04-21: qty 20

## 2024-04-21 MED ORDER — FENTANYL CITRATE (PF) 100 MCG/2ML IJ SOLN
INTRAMUSCULAR | Status: AC
  Filled 2024-04-21: qty 2

## 2024-04-21 MED ORDER — ROCURONIUM BROMIDE 100 MG/10ML IV SOLN
INTRAVENOUS | Status: DC | PRN
  Administered 2024-04-21: 50 mg via INTRAVENOUS

## 2024-04-21 MED ORDER — PHENYLEPHRINE 80 MCG/ML (10ML) SYRINGE FOR IV PUSH (FOR BLOOD PRESSURE SUPPORT)
PREFILLED_SYRINGE | INTRAVENOUS | Status: DC | PRN
  Administered 2024-04-21: 80 ug via INTRAVENOUS

## 2024-04-21 MED ORDER — DICLOFENAC SUPPOSITORY 100 MG
RECTAL | Status: DC | PRN
  Administered 2024-04-21: 100 mg via RECTAL

## 2024-04-21 MED ORDER — DICLOFENAC SUPPOSITORY 100 MG
RECTAL | Status: AC
  Filled 2024-04-21: qty 1

## 2024-04-21 MED ORDER — LIDOCAINE 2% (20 MG/ML) 5 ML SYRINGE
INTRAMUSCULAR | Status: DC | PRN
  Administered 2024-04-21: 80 mg via INTRAVENOUS

## 2024-04-21 MED ORDER — EPHEDRINE SULFATE (PRESSORS) 25 MG/5ML IV SOSY
PREFILLED_SYRINGE | INTRAVENOUS | Status: DC | PRN
  Administered 2024-04-21: 5 mg via INTRAVENOUS

## 2024-04-21 MED ORDER — PROPOFOL 500 MG/50ML IV EMUL
INTRAVENOUS | Status: AC
  Filled 2024-04-21: qty 50

## 2024-04-21 NOTE — Progress Notes (Signed)
 Eric [name] 11:00 AM  Subjective: Patient doing well without any problems since he was seen recently in the office and his case discussed with his wife as well and we answered all their questions and we discussed the procedure  Objective: Vital signs stable afebrile no acute distress exam please see preassessment evaluation  Assessment: Distal CBD stricture with atypical cells  Plan: Okay to proceed with repeat ERCP and stent change and obtain more tissue anesthesia assistance  [facility]  office [phone] After 5PM or if no answer call [phone]

## 2024-04-21 NOTE — Anesthesia Postprocedure Evaluation (Signed)
 Anesthesia Post Note  Patient: Eric [name]  Procedure(s) Performed: ERCP, WITH INTERVENTION IF INDICATED STENT REMOVAL BRUSH BIOPSY, BILE DUCT INSERTION, STENT, BILE DUCT     Patient location during evaluation: PACU Anesthesia Type: General Level of consciousness: awake and alert Pain management: pain level controlled Vital Signs Assessment: post-procedure vital signs reviewed and stable Respiratory status: spontaneous breathing, nonlabored ventilation, respiratory function stable and patient connected to nasal cannula oxygen Cardiovascular status: blood pressure returned to baseline and stable Postop Assessment: no apparent nausea or vomiting Anesthetic complications: no   No notable events documented.  Last Vitals:  Vitals:   04/21/24 1220 04/21/24 1233  BP: (!) 111/59 (!) 115/59  Pulse: (!) 51 (!) 54  Resp: 18 15  Temp:    SpO2: 98% 98%    Last Pain:  Vitals:   04/21/24 1233  TempSrc:   PainSc: 0-No pain                 Jenevie Casstevens

## 2024-04-21 NOTE — Anesthesia Preprocedure Evaluation (Signed)
 Anesthesia Evaluation  Patient identified by MRN, date of birth, ID band Patient awake    Reviewed: Allergy & Precautions, H&P , NPO status , Patient's Chart, lab work & pertinent test results  Airway Mallampati: II  TM Distance: >3 FB Neck ROM: Full    Dental no notable dental hx. (+) Teeth Intact, Dental Advisory Given   Pulmonary neg pulmonary ROS   Pulmonary exam normal breath sounds clear to auscultation       Cardiovascular hypertension, Pt. on home beta blockers + CAD  + dysrhythmias Atrial Fibrillation + Valvular Problems/Murmurs  Rhythm:Regular Rate:Normal     Neuro/Psych CVA  negative psych ROS   GI/Hepatic negative GI ROS, Neg liver ROS,,,  Endo/Other  negative endocrine ROS    Renal/GU negative Renal ROS  negative genitourinary   Musculoskeletal   Abdominal   Peds  Hematology negative hematology ROS (+) Blood dyscrasia, anemia   Anesthesia Other Findings   Reproductive/Obstetrics negative OB ROS                              Anesthesia Physical Anesthesia Plan  ASA: 4  Anesthesia Plan: General   Post-op Pain Management:    Induction: Intravenous  PONV Risk Score and Plan: 1 and TIVA  Airway Management Planned: Oral ETT  Additional Equipment: None  Intra-op Plan:   Post-operative Plan: Extubation in OR  Informed Consent: I have reviewed the patients History and Physical, chart, labs and discussed the procedure including the risks, benefits and alternatives for the proposed anesthesia with the patient or authorized representative who has indicated his/her understanding and acceptance.     Dental advisory given  Plan Discussed with: CRNA and Anesthesiologist  Anesthesia Plan Comments: ( TEE      1. Left ventricular ejection fraction, by estimation, is 60 to 65%. The  left ventricle has normal function.   2. Right ventricular systolic function is normal.  The right ventricular  size is severely enlarged.   3. LAA appears surgically oversewn but is still patent (slit like  communication is seen towards the anterior mitral annulus) with no  evidence of thrombus. Left atrial size was severely dilated. No left  atrial/left atrial appendage thrombus was detected.   4. Right atrial size was severely dilated.   5. Valve-in-ring TMVR with vascular plug at the anterolateral aspect (10  o'clock), with a residual defect lateral and posterior to the plug. The  defect is roughly 5-6 mm in diameter at its broadest point. Using the PISA  method, the effective regurgitant   orifice area is 0.18 cm sq, regurgitant volume 20 ml, regurgitant  fraction 17%. The mitral valve has been repaired/replaced. Mild to  moderate mitral valve regurgitation. Moderate mitral stenosis. The mean  mitral valve gradient is 11.0 mmHg with average  heart rate of 75 bpm. There is a 26 mm Edwards Sapien bioprosthetic valve  present in the mitral position. Procedure Date: 12/19/23. Echo findings are  consistent with perivalvular leak the mitral prosthesis.   6. The tricuspid valve is myxomatous.   7. Valve-in-valve TAVR. The aortic valve has been repaired/replaced.  Aortic valve regurgitation is not visualized. There is a 23 mm Edwards  Sapien prosthetic (TAVR) valve present in the aortic position. Procedure  Date: 11/25/2018. Echo findings are  consistent with normal structure and function of the aortic valve  prosthesis. Aortic valve mean gradient measures 14.0 mmHg. Aortic valve  Vmax measures 2.40 m/s.  8. Evidence of atrial level shunting detected by color flow Doppler.  Small iatrogenic secundum ASD following transseptal puncture,  approximately 5 mm in diamter with mostly left to right shunt, but readily  reverse shunting right to left with each  breath.   9. 3D performed of the mitral valve and demonstrates periannular mitral  regurgitation.  )          Anesthesia Quick Evaluation

## 2024-04-21 NOTE — Anesthesia Procedure Notes (Signed)
 Procedure Name: Intubation Date/Time: 04/21/2024 11:15 AM  Performed by: Nada Corean CROME, CRNAPre-anesthesia Checklist: Patient identified, Emergency Drugs available, Suction available, Patient being monitored and Timeout performed Patient Re-evaluated:Patient Re-evaluated prior to induction Oxygen Delivery Method: Circle system utilized Preoxygenation: Pre-oxygenation with 100% oxygen Induction Type: IV induction Ventilation: Mask ventilation without difficulty and Oral airway inserted - appropriate to patient size Laryngoscope Size: Mac and 4 Grade View: Grade I Tube type: Oral Tube size: 7.5 mm Number of attempts: 1 Airway Equipment and Method: Stylet Placement Confirmation: ETT inserted through vocal cords under direct vision, positive ETCO2 and breath sounds checked- equal and bilateral Secured at: 23 cm Tube secured with: Tape Dental Injury: Teeth and Oropharynx as per pre-operative assessment

## 2024-04-21 NOTE — Op Note (Signed)
 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 Estimated Blood Loss:     Estimated blood loss: none. Procedure:                Pre-Anesthesia Assessment:                           - Prior to the procedure, a History and Physical                            was performed, and patient medications and                            allergies were reviewed. The patient's tolerance of                            previous anesthesia was also reviewed. The risks                            and benefits of the procedure and the sedation                            options and risks were discussed with the patient.                            All questions were answered, and informed consent                            was obtained. Prior Anticoagulants: The patient has                            taken Eliquis  (apixaban ), last dose was 3 days  prior to procedure. ASA Grade Assessment: III - A                            patient with severe systemic disease. After                            reviewing the risks and benefits, the patient was                            deemed in satisfactory condition to undergo the                            procedure.                           After obtaining informed consent, the scope was                             passed under direct vision. Throughout the                            procedure, the patient's blood pressure, pulse, and                            oxygen saturations were monitored continuously. The                            [id number]-[id number] ([id number]) Olympus duodenoscope was                            introduced through the mouth, and used to inject                            contrast into and used to cannulate the bile duct.                            The ERCP was accomplished without difficulty. The                            patient tolerated the procedure well. Scope In: Scope Out: Findings:      One plastic biliary stent originating in the biliary tree was emerging       from the major papilla. The stent was not assessed for patency. One       stent was removed from the biliary tree using a mini snare and sent for       cytology. The major papilla was normal. Deep selective cannulation of       the CBD was readily obtained using the sphincterotome and the wire was       advanced into the intrahepatics and dye was injected which confirmed the       stricture at the distal part of the CBD and there was minimal to none       biliary drainage seen throughout the procedure until the stent was       placed and to discover objects, the  biliary tree was swept with an       adjustable 9- 12 mm balloon starting at the bifurcation. Sludge was       swept from the duct. The 12 mm balloon did pass 1 times through the       patent sphincterotomy site but on subsequent pull-through's we had to       lower it to withdrawal it and on occlusion cholangiogram the distal       stricture was confirmed and we proceeded with obtaining cells for       cytology were obtained by brushing in the lower third of the main bile       duct. We then placed one 10 Fr by 4 cm uncovered metal biliary stent       with no external flaps and no internal flaps was placed 3.5 cm into the       common bile duct. Fluid  flowed through the stent. The stent was in good       position. There was adequate biliary drainage and there was no       pancreatic duct injection or wire advancement throughout the procedure       and the scope and introducer and wire were removed and the patient       tolerated the procedure well Impression:               - One not assessed for patency stent from the                            biliary tree was seen in the major papilla.                           - The major papilla appeared normal.                           - One stent was removed from the biliary tree. And                            sent for cytology                           - The biliary tree was swept and sludge was found.                           - Cells for cytology obtained in the lower third of                            the main duct.                           - One uncovered metal biliary stent was placed into                            the common bile duct. Moderate Sedation:      Not Applicable - Patient had care per Anesthesia. Recommendation:           - Clear liquid diet for 6 hours. If doing well at 6  PM may have soft solid                           - Continue present medications.                           - Resume Eliquis  (apixaban ) at prior dose tomorrow.                            If doing well                           - Return to GI clinic in 4 weeks.                           - Telephone GI clinic for pathology results in 1                            week.                           - Telephone GI clinic if symptomatic PRN. Procedure Code(s):        --- Professional ---                           [phone], Endoscopic retrograde                            cholangiopancreatography (ERCP); with removal and                            exchange of stent(s), biliary or pancreatic duct,                            including pre- and post-dilation and guide wire                             passage, when performed, including sphincterotomy,                            when performed, each stent exchanged                           43264, Endoscopic retrograde                            cholangiopancreatography (ERCP); with removal of                            calculi/debris from biliary/pancreatic duct(s) Diagnosis Code(s):        --- Professional ---                           S03.10, Presence of other specified functional                            implants  Z46.59, Encounter for fitting and adjustment of                            other gastrointestinal appliance and device                           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 All rights reserved. The codes documented in this report are preliminary and upon coder review may  be revised to meet current compliance requirements. Oliva Boots, MD 04/21/2024 12:04:34 PM This report has been signed electronically. Number of Addenda: 0

## 2024-04-21 NOTE — Transfer of Care (Signed)
 Immediate Anesthesia Transfer of Care Note  Patient: Eric [name]  Procedure(s) Performed: ERCP, WITH INTERVENTION IF INDICATED STENT REMOVAL BRUSH BIOPSY, BILE DUCT INSERTION, STENT, BILE DUCT  Patient Location: PACU and Endoscopy Unit  Anesthesia Type:General  Level of Consciousness: awake, alert , oriented, and patient cooperative  Airway & Oxygen Therapy: Patient Spontanous Breathing and Patient connected to face mask oxygen  Post-op Assessment: Report given to RN and Post -op Vital signs reviewed and stable  Post vital signs: Reviewed and stable  Last Vitals:  Vitals Value Taken Time  BP 166/74 04/21/24 12:08  Temp    Pulse 58 04/21/24 12:09  Resp 14 04/21/24 12:09  SpO2 100 % 04/21/24 12:09  Vitals shown include unfiled device data.  Last Pain:  Vitals:   04/21/24 1002  TempSrc: Temporal  PainSc: 0-No pain         Complications: No notable events documented.

## 2024-04-21 NOTE — Discharge Instructions (Addendum)
 Call if GI question or problem otherwise clear liquids only until 6 PM and if doing well this evening may have soft solids otherwise slowly advance tomorrow and if doing well tomorrow may restart his Eliquis  then and call for pathology results in 1 week  YOU HAD AN ENDOSCOPIC PROCEDURE TODAY: Refer to the procedure report and other information in the discharge instructions given to you for any specific questions about what was found during the examination. If this information does not answer your questions, please call [facility] office at [phone] to clarify.   YOU SHOULD EXPECT: Some feelings of bloating in the abdomen. Passage of more gas than usual. Walking can help get rid of the air that was put into your GI tract during the procedure and reduce the bloating. If you had a lower endoscopy (such as a colonoscopy or flexible sigmoidoscopy) you may notice spotting of blood in your stool or on the toilet paper. Some abdominal soreness may be present for a day or two, also.  DIET: Your first meal following the procedure should be a light meal and then it is ok to progress to your normal diet. A half-sandwich or bowl of soup is an example of a good first meal. Heavy or fried foods are harder to digest and may make you feel nauseous or bloated. Drink plenty of fluids but you should avoid alcoholic beverages for 24 hours. If you had a esophageal dilation, please see attached instructions for diet.    ACTIVITY: Your care partner should take you home directly after the procedure. You should plan to take it easy, moving slowly for the rest of the day. You can resume normal activity the day after the procedure however YOU SHOULD NOT DRIVE, use power tools, machinery or perform tasks that involve climbing or major physical exertion for 24 hours (because of the sedation medicines used during the test).   SYMPTOMS TO REPORT IMMEDIATELY: A gastroenterologist can be reached at any hour. Please call [phone]   for any of the following symptoms:  Following lower endoscopy (colonoscopy, flexible sigmoidoscopy) Excessive amounts of blood in the stool  Significant tenderness, worsening of abdominal pains  Swelling of the abdomen that is new, acute  Fever of 100 or higher  Following upper endoscopy (EGD, EUS, ERCP, esophageal dilation) Vomiting of blood or coffee ground material  New, significant abdominal pain  New, significant chest pain or pain under the shoulder blades  Painful or persistently difficult swallowing  New shortness of breath  Black, tarry-looking or red, bloody stools  FOLLOW UP:  If any biopsies were taken you will be contacted by phone or by letter within the next 1-3 weeks. Call [phone]  if you have not heard about the biopsies in 3 weeks.  Please also call with any specific questions about appointments or follow up tests. YOU HAD AN ENDOSCOPIC PROCEDURE TODAY: Refer to the procedure report and other information in the discharge instructions given to you for any specific questions about what was found during the examination. If this information does not answer your questions, please call [facility] office at [phone] to clarify.

## 2024-04-22 ENCOUNTER — Encounter (HOSPITAL_COMMUNITY): Payer: Self-pay | Admitting: Gastroenterology

## 2024-04-24 LAB — CYTOLOGY - NON PAP

## 2024-04-24 LAB — LAB REPORT - SCANNED: EGFR: 75

## 2024-05-11 ENCOUNTER — Telehealth: Payer: Self-pay

## 2024-05-11 NOTE — Telephone Encounter (Signed)
 [facility] to reschedule patient from 06/26/24 appointment since Dr. Copper will be out of town. The patient's wife returned my call immediately and the patient has been rescheduled to 06/25/24 at 11:00AM, okay per Rockie Redman, RN.

## 2024-05-16 ENCOUNTER — Emergency Department (HOSPITAL_COMMUNITY)

## 2024-05-16 ENCOUNTER — Encounter (HOSPITAL_COMMUNITY): Payer: Self-pay | Admitting: Emergency Medicine

## 2024-05-16 ENCOUNTER — Emergency Department (HOSPITAL_COMMUNITY)
Admission: EM | Admit: 2024-05-16 | Discharge: 2024-05-16 | Disposition: A | Discharge status: Home / Self Care | Attending: Emergency Medicine | Admitting: Emergency Medicine

## 2024-05-16 ENCOUNTER — Other Ambulatory Visit: Payer: Self-pay

## 2024-05-16 DIAGNOSIS — D649 Anemia, unspecified: Secondary | ICD-10-CM | POA: Insufficient documentation

## 2024-05-16 DIAGNOSIS — R1013 Epigastric pain: Secondary | ICD-10-CM | POA: Insufficient documentation

## 2024-05-16 DIAGNOSIS — R7989 Other specified abnormal findings of blood chemistry: Secondary | ICD-10-CM | POA: Diagnosis not present

## 2024-05-16 DIAGNOSIS — R079 Chest pain, unspecified: Secondary | ICD-10-CM

## 2024-05-16 DIAGNOSIS — Z7901 Long term (current) use of anticoagulants: Secondary | ICD-10-CM | POA: Insufficient documentation

## 2024-05-16 LAB — CBC
HCT: 34.4 % — ABNORMAL LOW (ref 39.0–52.0)
Hemoglobin: 10.7 g/dL — ABNORMAL LOW (ref 13.0–17.0)
MCH: 31.2 pg (ref 26.0–34.0)
MCHC: 31.1 g/dL (ref 30.0–36.0)
MCV: 100.3 fL — ABNORMAL HIGH (ref 80.0–100.0)
Platelets: 127 K/uL — ABNORMAL LOW (ref 150–400)
RBC: 3.43 MIL/uL — ABNORMAL LOW (ref 4.22–5.81)
RDW: 15.7 % — ABNORMAL HIGH (ref 11.5–15.5)
WBC: 5.7 K/uL (ref 4.0–10.5)
nRBC: 0 % (ref 0.0–0.2)

## 2024-05-16 LAB — TROPONIN T, HIGH SENSITIVITY
Troponin T High Sensitivity: 25 ng/L — ABNORMAL HIGH (ref 0–19)
Troponin T High Sensitivity: 26 ng/L — ABNORMAL HIGH (ref 0–19)

## 2024-05-16 LAB — HEPATIC FUNCTION PANEL
ALT: 9 U/L (ref 0–44)
AST: 59 U/L — ABNORMAL HIGH (ref 15–41)
Albumin: 3.7 g/dL (ref 3.5–5.0)
Alkaline Phosphatase: 55 U/L (ref 38–126)
Bilirubin, Direct: 0.5 mg/dL — ABNORMAL HIGH (ref 0.0–0.2)
Indirect Bilirubin: 4.8 mg/dL — ABNORMAL HIGH (ref 0.3–0.9)
Total Bilirubin: 5.3 mg/dL — ABNORMAL HIGH (ref 0.0–1.2)
Total Protein: 6.6 g/dL (ref 6.5–8.1)

## 2024-05-16 LAB — BASIC METABOLIC PANEL WITH GFR
Anion gap: 10 (ref 5–15)
BUN: 29 mg/dL — ABNORMAL HIGH (ref 8–23)
CO2: 25 mmol/L (ref 22–32)
Calcium: 9.7 mg/dL (ref 8.9–10.3)
Chloride: 104 mmol/L (ref 98–111)
Creatinine, Ser: 1 mg/dL (ref 0.61–1.24)
GFR, Estimated: >60 mL/min
Glucose, Bld: 132 mg/dL — ABNORMAL HIGH (ref 70–99)
Potassium: 4.7 mmol/L (ref 3.5–5.1)
Sodium: 139 mmol/L (ref 135–145)

## 2024-05-16 LAB — LIPASE, BLOOD: Lipase: 51 U/L (ref 11–51)

## 2024-05-16 MED ORDER — IOHEXOL 350 MG/ML SOLN
75.0000 mL | Freq: Once | INTRAVENOUS | Status: AC | PRN
  Administered 2024-05-16: 75 mL via INTRAVENOUS

## 2024-05-16 MED ORDER — ONDANSETRON HCL 4 MG/2ML IJ SOLN
4.0000 mg | Freq: Once | INTRAMUSCULAR | Status: AC
  Administered 2024-05-16: 4 mg via INTRAVENOUS
  Filled 2024-05-16: qty 2

## 2024-05-16 MED ORDER — MORPHINE SULFATE (PF) 2 MG/ML IV SOLN
2.0000 mg | Freq: Once | INTRAVENOUS | Status: AC
  Administered 2024-05-16: 2 mg via INTRAVENOUS
  Filled 2024-05-16: qty 1

## 2024-05-16 NOTE — Discharge Instructions (Signed)
 You were seen today for chest pain and belly pain. While you were here we monitored your vitals, preformed a physical exam, and labs and imaging. These were all reassuring and there is no indication for any further testing or intervention in the emergency department at this time.   Things to do:  - Follow up with your primary care provider within the next 1-2 weeks - Please follow-up with Dr. Rosalie with gastroenterology in the next 1 to 2 weeks  Return to the emergency department if you have any new or worsening symptoms including worsening abdominal pain, chest pain, shortness of breath, inability tolerate p.o., or if you have any other concerns.

## 2024-05-16 NOTE — ED Provider Notes (Signed)
 " [facility] Provider Note   CSN: [id number] Arrival date & time: 05/16/24  1135     Patient presents with: No chief complaint on file.   Eric [name] is a 89 y.o. male.   Patient is an 89 year old male who presents to the emergency department with a chief complaint of chest pain and epigastric abdominal pain which became worse this morning.  Patient does have a long cardiac history.  Patient was given nitroglycerin  by EMS which she notes did help his symptoms but it has began to improve.  Patient notes that he has had associated nausea without vomiting as well.  He denies any diarrhea or constipation.  He denies any dysuria or hematuria.  He has had no associated shortness of breath.        Prior to Admission medications  Medication Sig Start Date End Date Taking? Authorizing Provider  amoxicillin  (AMOXIL ) 500 MG tablet Take 4 tablets (2,000 mg total) by mouth as directed. Take 1 hour prior to dental work, including cleanings. Patient not taking: No sig reported 04/27/19   Dann Candyce RAMAN, MD  apixaban  (ELIQUIS ) 5 MG TABS tablet TAKE 1 TABLET BY MOUTH TWICE  DAILY 10/08/23   Cooper, Michael, MD  Ascorbic Acid (VITAMIN C PO) Take 500 mg by mouth in the morning. Gummies 250 mg each    [provider]  atorvastatin  (LIPITOR) 20 MG tablet Take 1 tablet (20 mg total) by mouth daily. 01/30/24 04/29/24  Sebastian Lamarr SAUNDERS, PA-C  cyanocobalamin (VITAMIN B12) 1000 MCG tablet Take 1,000 mcg by mouth daily.    [provider]  diltiazem  (CARDIZEM ) 30 MG tablet TAKE 1 TABLET BY MOUTH DAILY AS  NEEDED Patient not taking: No sig reported 03/10/24   Wonda Sharper, MD  Eyelid Cleansers (OCUSOFT EYELID CLEANSING) PADS Place 1 application  into both eyes in the morning.    [provider]  ezetimibe  (ZETIA ) 10 MG tablet Take 1 tablet (10 mg total) by mouth daily. 01/30/24 04/29/24  Sebastian Lamarr SAUNDERS, PA-C  folic acid   (FOLVITE ) 1 MG tablet Take 1 tablet (1 mg total) by mouth daily. 02/03/24   Debby Olam POUR, NP  furosemide  (LASIX ) 20 MG tablet Take 2 tablets (40 mg total) by mouth daily. 01/30/24   Sebastian Lamarr SAUNDERS, PA-C  metoprolol  tartrate (LOPRESSOR ) 25 MG tablet Take 2 tablets (50 mg total) by mouth 2 (two) times daily. 04/21/24   Wonda Sharper, MD  nitroGLYCERIN  (NITROSTAT ) 0.4 MG SL tablet Place 1 tablet (0.4 mg total) under the tongue every 5 (five) minutes as needed. Patient not taking: No sig reported 04/03/24   Wonda Sharper, MD  polyethylene glycol (MIRALAX  / GLYCOLAX ) 17 g packet Take 17 g by mouth in the morning.    [provider]  potassium chloride  (KLOR-CON  M) 10 MEQ tablet Take 10 mEq by mouth daily. Take with Furosemide     [provider]  sennosides-docusate sodium (SENOKOT-S) 8.6-50 MG tablet Take 1 tablet by mouth at bedtime.    [provider]  spironolactone  (ALDACTONE ) 25 MG tablet Take 0.5 tablets (12.5 mg total) by mouth in the morning. 12/30/23   Sebastian Lamarr SAUNDERS, PA-C  traZODone (DESYREL) 50 MG tablet Take 75 mg by mouth at bedtime. 01/22/24   [provider]    Allergies: Patient has no known allergies.    Review of Systems  Cardiovascular:  Positive for chest pain.  Gastrointestinal:  Positive for abdominal pain and nausea.  All other systems reviewed and are negative.   Updated Vital Signs BP 138/74   Pulse 70   Temp 98 F (36.7 C) (Oral)   Resp 19   SpO2 100%   Physical Exam Vitals and nursing note reviewed.  Constitutional:      General: He is not in acute distress.    Appearance: Normal appearance. He is not ill-appearing.  HENT:     Head: Normocephalic and atraumatic.     Nose: Nose normal.     Mouth/Throat:     Mouth: Mucous membranes are moist.  Eyes:     Extraocular Movements: Extraocular movements intact.     Conjunctiva/sclera: Conjunctivae normal.     Pupils: Pupils are equal, round, and reactive to light.   Cardiovascular:     Rate and Rhythm: Normal rate and regular rhythm.     Pulses: Normal pulses.     Heart sounds: Normal heart sounds. No murmur heard.    No gallop.  Pulmonary:     Effort: Pulmonary effort is normal. No respiratory distress.     Breath sounds: Normal breath sounds. No stridor. No wheezing, rhonchi or rales.  Abdominal:     General: Abdomen is flat. Bowel sounds are normal. There is no distension.     Palpations: Abdomen is soft.     Tenderness: There is no guarding.     Comments: Mild epigastric tenderness  Musculoskeletal:        General: Normal range of motion.     Cervical back: Normal range of motion and neck supple. No rigidity or tenderness.     Right lower leg: No edema.     Left lower leg: No edema.  Skin:    General: Skin is warm and dry.     Findings: No bruising or rash.  Neurological:     General: No focal deficit present.     Mental Status: He is alert and oriented to person, place, and time. Mental status is at baseline.  Psychiatric:        Mood and Affect: Mood normal.        Behavior: Behavior normal.        Thought Content: Thought content normal.        Judgment: Judgment normal.     (all labs ordered are listed, but only abnormal results are displayed) Labs Reviewed  BASIC METABOLIC PANEL WITH GFR - Abnormal; Notable for the following components:      Result Value   Glucose, Bld 132 (*)    BUN 29 (*)    All other components within normal limits  CBC - Abnormal; Notable for the following components:   RBC 3.43 (*)    Hemoglobin 10.7 (*)    HCT 34.4 (*)    MCV 100.3 (*)    RDW 15.7 (*)    Platelets 127 (*)    All other components within normal limits  TROPONIN T, HIGH SENSITIVITY - Abnormal; Notable for the following components:   Troponin T High Sensitivity 25 (*)    All other components within normal limits  LIPASE, BLOOD  HEPATIC FUNCTION PANEL  TROPONIN T, HIGH SENSITIVITY    EKG: None  Radiology: [facility] Chest Port 1  View Result Date: 05/16/2024 CLINICAL DATA:  Chest pain. EXAM: PORTABLE CHEST - 1 VIEW COMPARISON:  06/29/2023 FINDINGS: Cardiomediastinal silhouette and pulmonary vasculature are within normal limits. Lungs are clear. 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 IMPRESSION: No acute cardiopulmonary process. Electronically Signed   By:  Aliene  Mir M.D.   On: 05/16/2024 14:05     Procedures   Medications Ordered in the ED  morphine  (PF) 2 MG/ML injection 2 mg (2 mg Intravenous Given 05/16/24 1308)  ondansetron  (ZOFRAN ) injection 4 mg (4 mg Intravenous Given 05/16/24 1309)                                    Medical Decision Making Amount and/or Complexity of Data Reviewed Labs: ordered. Radiology: ordered.  Risk Prescription drug management.   This patient presents to the ED for concern of chest pain, abdominal pain differential diagnosis includes ACS, pulmonary embolus, pericarditis, myocarditis, endocarditis, aortic aneurysm or dissection, pneumonia, pneumothorax, hemothorax, acute cholecystitis, appendicitis, small bowel obstruction, diverticulitis, pyelonephritis, kidney stone    Additional history obtained:  Additional history obtained from medical records External records from outside source obtained and reviewed including medical records   Lab Tests:  I Ordered, and personally interpreted labs.  The pertinent results include: No leukocytosis, anemia at baseline, normal electrolytes, normal kidney function, elevated initial troponin, serial troponin pending   Imaging Studies ordered:  I ordered imaging studies including chest x-ray, CT scan abdomen and pelvis, CTA chest I independently visualized and interpreted imaging which showed no acute cardiopulmonary process on x-ray, CT scans pending I agree with the radiologist interpretation   Medicines ordered and prescription drug management:  I ordered medication including morphine , Zofran  for chest pain and  abdominal pain Reevaluation of the patient after these medicines showed that the patient improved I have reviewed the patients home medicines and have made adjustments as needed   Problem List / ED Course:  Patient is doing much better at this time.  Patient does have blood work which is unremarkable but hepatic panel and lipase are still pending.  He does have an initial elevated troponin which is mildly over normal and will obtain serial troponin.  Chest x-ray has been unremarkable.  He is pending CTA chest as well as CT scan abdomen and pelvis for further evaluation of his symptoms.  Initial EKG demonstrated no indication for STEMI.  Will sign patient out to Dr. Guillermina and Dr. Patt at shift change pending final dispo.   Social Determinants of Health:  None        Final diagnoses:  None    ED Discharge Orders     None          Daralene Lonni JONETTA DEVONNA 05/16/24 1455    Tegeler, Lonni PARAS, MD 05/16/24 1608  "

## 2024-05-16 NOTE — ED Provider Notes (Signed)
 Assume Care - Medical Decision Making  Care of patient assumed from previous emergency medicine provider at 1500. See their note for further details of history, physical exam and plan.  Briefly, Eric [name] is a 89 y.o. male who presents with: Chest pain and epigastric pain  Clinical Course as of 05/16/24 1620  Sat May 16, 2024  1529 -chest/epigastric pain -f/u imaging and labs [SE]    Clinical Course User Index [SE] Guillermina Hamilton, MD     Reassessment: I personally reassessed the patient:  Vital Signs:  The most current vitals were  Vitals:   05/16/24 1149 05/16/24 1245 05/16/24 1445 05/16/24 1515  BP: (!) 145/89 138/74 135/88 (!) 143/95  Pulse: 70     Resp: 16 19 18 18   Temp: 98 F (36.7 C)     TempSrc: Oral     SpO2: 100%      Hemodynamics:  The patient is hemodynamically stable. Mental Status:  The patient is alert  Additional MDM/ED Course: Patient ended up in hemodynamically stable condition.  Labs largely unremarkable thus far.  Currently pending delta troponin and CT angio as well as CT abdomen pelvis with contrast.  CT PE and CT abdomen pelvis largely unremarkable.  Agree with radiology.  Does have some gas in the biliary system likely secondary to stenting.  Delta troponin stable.  Labs are largely otherwise unremarkable.  Bilirubins mildly elevated, but family states this has happened in the past as well.  Patient completely asymptomatic at this time and would like to go home if able.  States that his symptoms resolved earlier after a brief nap and has not had any recurrence in epigastric or chest pain since the incident.  Son and wife in the room are okay with this plan.  They plan to follow-up with their PCP in the outpatient setting as well as their GI doctor.  Patient discharged home in hemodynamically stable condition with strict return precautions.  They plan to return to the emergency department for any further concerns.   Disposition:  I discussed the  plan for discharge with the patient and/or their surrogate at bedside prior to discharge and they were in agreement with the plan and verbalized understanding of the return precautions provided. All questions answered to the best of my ability. Ultimately, the patient was discharged in stable condition with stable vital signs. I am reassured that they are capable of close follow up and good social support at home.   Clinical Impression:  1. Chest pain, unspecified type    The plan for this patient was discussed with Dr. Patt, who voiced agreement and who oversaw evaluation and treatment of this patient.   Electronically signed by:   Hamilton Carlin Guillermina, M.D. PGY-2, Emergency Medicine      Guillermina Hamilton, MD 05/16/24 1815    Patt Alm Macho, MD 05/16/24 2159

## 2024-05-16 NOTE — ED Triage Notes (Addendum)
 Pt BIb [facility] from home for 7/10 CP on waking up.  Extensive cardiac hx.  3 nitroglycerin  with EMS, 1 at home.  Pain 7/10 to 4/10 after nitroglycerin , back to 7/10.  Nausea without vomiting. PT is on Eliquis .   155/91 98% RA, HR 66

## 2024-05-30 ENCOUNTER — Other Ambulatory Visit: Payer: Self-pay | Admitting: Cardiovascular Disease

## 2024-05-30 DIAGNOSIS — I4811 Longstanding persistent atrial fibrillation: Secondary | ICD-10-CM

## 2024-06-01 NOTE — Telephone Encounter (Signed)
 Pt last saw Dr Wonda 03/20/24, last labs 05/16/24 Creat 1.0, age 89, weight 74.8kg, based on specified criteria pt is on appropriate dosage of Eliquis  5mg  BID for afib.  Will refill rx.

## 2024-06-11 ENCOUNTER — Telehealth: Payer: Self-pay | Admitting: Oncology

## 2024-06-11 NOTE — Telephone Encounter (Signed)
 Rescheduled appointments per incoming call from the patients spouse. Talked with Mrs.Vieth and she is aware of the changes made to the patients upcoming appointments.

## 2024-06-15 ENCOUNTER — Inpatient Hospital Stay

## 2024-06-15 ENCOUNTER — Inpatient Hospital Stay: Admitting: Oncology

## 2024-06-17 ENCOUNTER — Other Ambulatory Visit: Payer: Self-pay | Admitting: Cardiovascular Disease

## 2024-06-18 ENCOUNTER — Inpatient Hospital Stay: Admitting: Nurse Practitioner

## 2024-06-18 ENCOUNTER — Inpatient Hospital Stay: Attending: Nurse Practitioner

## 2024-06-18 DIAGNOSIS — D696 Thrombocytopenia, unspecified: Secondary | ICD-10-CM

## 2024-06-18 DIAGNOSIS — D649 Anemia, unspecified: Secondary | ICD-10-CM | POA: Diagnosis not present

## 2024-06-18 LAB — CMP (CANCER CENTER ONLY)
ALT: 11 U/L (ref 0–44)
AST: 55 U/L — ABNORMAL HIGH (ref 15–41)
Albumin: 4.2 g/dL (ref 3.5–5.0)
Alkaline Phosphatase: 56 U/L (ref 38–126)
Anion gap: 9 (ref 5–15)
BUN: 28 mg/dL — ABNORMAL HIGH (ref 8–23)
CO2: 27 mmol/L (ref 22–32)
Calcium: 10.2 mg/dL (ref 8.9–10.3)
Chloride: 104 mmol/L (ref 98–111)
Creatinine: 1.05 mg/dL (ref 0.61–1.24)
GFR, Estimated: >60 mL/min
Glucose, Bld: 101 mg/dL — ABNORMAL HIGH (ref 70–99)
Potassium: 4.4 mmol/L (ref 3.5–5.1)
Sodium: 139 mmol/L (ref 135–145)
Total Bilirubin: 5.3 mg/dL (ref 0.0–1.2)
Total Protein: 7.4 g/dL (ref 6.5–8.1)

## 2024-06-18 LAB — CBC WITH DIFFERENTIAL (CANCER CENTER ONLY)
Abs Immature Granulocytes: 0.01 10*3/uL (ref 0.00–0.07)
Basophils Absolute: 0.1 10*3/uL (ref 0.0–0.1)
Basophils Relative: 1 %
Eosinophils Absolute: 0.1 10*3/uL (ref 0.0–0.5)
Eosinophils Relative: 2 %
HCT: 33.4 % — ABNORMAL LOW (ref 39.0–52.0)
Hemoglobin: 10.6 g/dL — ABNORMAL LOW (ref 13.0–17.0)
Immature Granulocytes: 0 %
Lymphocytes Relative: 28 %
Lymphs Abs: 1.1 10*3/uL (ref 0.7–4.0)
MCH: 31.6 pg (ref 26.0–34.0)
MCHC: 31.7 g/dL (ref 30.0–36.0)
MCV: 99.7 fL (ref 80.0–100.0)
Monocytes Absolute: 0.3 10*3/uL (ref 0.1–1.0)
Monocytes Relative: 9 %
Neutro Abs: 2.3 10*3/uL (ref 1.7–7.7)
Neutrophils Relative %: 60 %
Platelet Count: 115 10*3/uL — ABNORMAL LOW (ref 150–400)
RBC: 3.35 MIL/uL — ABNORMAL LOW (ref 4.22–5.81)
RDW: 14.1 % (ref 11.5–15.5)
WBC Count: 3.9 10*3/uL — ABNORMAL LOW (ref 4.0–10.5)
nRBC: 0 % (ref 0.0–0.2)

## 2024-06-18 LAB — SAMPLE TO BLOOD BANK

## 2024-06-18 LAB — RETICULOCYTES
Immature Retic Fract: 8.7 % (ref 2.3–15.9)
RBC.: 3.39 MIL/uL — ABNORMAL LOW (ref 4.22–5.81)
Retic Count, Absolute: 87.8 10*3/uL (ref 19.0–186.0)
Retic Ct Pct: 2.6 % (ref 0.4–3.1)

## 2024-06-18 MED ORDER — FOLIC ACID 1 MG PO TABS: 1.0000 mg | ORAL_TABLET | Freq: Every day | ORAL | 3 refills | Status: AC

## 2024-06-18 NOTE — Progress Notes (Signed)
 CRITICAL VALUE STICKER  CRITICAL VALUE: T. Bili 5.3  RECEIVER (on-site recipient of call):Jebadiah Imperato,RN  DATE & TIME NOTIFIED: 06/18/24 @ 1140  MESSENGER (representative from lab):James  MD NOTIFIED: Olam Ned, NP  TIME OF NOTIFICATION: 1141  RESPONSE:  Being seen by NP

## 2024-06-18 NOTE — Progress Notes (Signed)
 " [facility] OFFICE PROGRESS NOTE   Diagnosis: Anemia, hyperbilirubinemia  INTERVAL HISTORY:   Eric [name] returns as scheduled.  He denies bleeding.  No fever.  He has occasional shooting pains in various locations including the abdomen, foot, leg.  The pain tends to resolve over a few seconds.  No dyspnea.  Overall poor appetite.  Objective:  Vital signs in last 24 hours:  Blood pressure 100/84, pulse (!) 58, temperature (!) 97.5 F (36.4 C), temperature source Temporal, resp. rate 18, height 6' (1.829 m), weight 158 lb 12.8 oz (72 kg), SpO2 100%.    Resp: Lungs clear bilaterally. Cardio: Irregular. GI: No hepatosplenomegaly.  Nontender. Vascular: Trace bilateral ankle edema. Skin: Jaundice.   Lab Results:  Lab Results  Component Value Date   WBC 3.9 (L) 06/18/2024   HGB 10.6 (L) 06/18/2024   HCT 33.4 (L) 06/18/2024   MCV 99.7 06/18/2024   PLT 115 (L) 06/18/2024   NEUTROABS 2.3 06/18/2024    Imaging:  No results found.  Medications: I have reviewed the patient's current medications.  Assessment/Plan: Macrocytic anemia Thrombocytopenia Chronic hyperbilirubinemia Biliary obstruction  01/08/2024 abdominal ultrasound -there was no evidence of acute cholecystitis; dilation of the common bile duct without obstructing lesion was identified.   MRCP 01/10/2024-marked dilation of the common bile duct with mild prominence of the central intrahepatic biliary tree; question of a soft tissue mass along the duodenal wall and distal common duct. ERCP  01/13/2024-no obvious bulb or second portion of the duodenum mass was seen.  Deep selective cannulization was readily obtained and an obvious short distal stricture was confirmed.  A temporary plastic biliary stent was placed into the common bile duct.  Distal common bile duct brushing showed atypical cells. ERCP 04/21/2024-stent removed; uncovered metal biliary stent placed into the common bile duct.  Cytology on the  common bile duct stent was negative for malignant cells, showed bile and filamentous bacteria.  Common bile duct brushing showed atypical cells. 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  He will continue folic acid .  He has a history of obstructive jaundice.  He underwent another ERCP 04/21/2024.  A metal common bile duct stent was placed.  Common bile duct brushings again showed atypical cells.  He continues to lose weight.  We again reviewed the possibility of an underlying malignancy.  Recent CTs do not show evidence of malignancy.  He declines further workup at this time.  He will return for lab and follow-up in 3 months.  We are available to see him sooner if needed.  Patient seen with Dr. Cloretta.    Olam Ned ANP/GNP-BC   06/18/2024  10:49 AM  This was a shared visit with Olam Ned.  Eric [name] appears unchanged.  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  A common bile duct stent remains in place.  He continues to lose weight.  CTs 05/16/2025 revealed no evidence of malignancy.  It remains possible he has pancreas or  bile duct carcinoma that has not been identified.  He does not wish to undergo further evaluation for a malignancy.  Arvella Hof, MD      "

## 2024-06-25 ENCOUNTER — Encounter: Payer: Self-pay | Admitting: Cardiovascular Disease

## 2024-06-25 ENCOUNTER — Ambulatory Visit: Admitting: Cardiovascular Disease

## 2024-06-25 VITALS — BP 104/64 | HR 52 | Ht 72.0 in | Wt 159.2 lb

## 2024-06-25 DIAGNOSIS — T8203XD Leakage of heart valve prosthesis, subsequent encounter: Secondary | ICD-10-CM | POA: Diagnosis not present

## 2024-06-25 DIAGNOSIS — I25118 Atherosclerotic heart disease of native coronary artery with other forms of angina pectoris: Secondary | ICD-10-CM

## 2024-06-25 DIAGNOSIS — Z952 Presence of prosthetic heart valve: Secondary | ICD-10-CM

## 2024-06-25 DIAGNOSIS — I4811 Longstanding persistent atrial fibrillation: Secondary | ICD-10-CM

## 2024-06-25 DIAGNOSIS — I5032 Chronic diastolic (congestive) heart failure: Secondary | ICD-10-CM | POA: Diagnosis not present

## 2024-06-25 NOTE — Assessment & Plan Note (Addendum)
 Appears to be maintaining sinus rhythm.  Anticoagulated with apixaban .

## 2024-06-25 NOTE — Assessment & Plan Note (Addendum)
 The patient had complex intervention ultimately treated with transcatheter mitral valve replacement in July 2025.  I recommended a repeat echocardiogram and follow-up visit in 6 months for surveillance.  We had a lengthy discussion today about his overall prognosis.  While he does not carry a firm diagnosis of malignancy, he has persistent hyperbilirubinemia, significant cardiac problems, and declining functional capacity.  They are going to discuss palliative care/hospice with their primary care physician.  I provided support for their decision and talk to them about my personal very positive experiences with hospice.

## 2024-06-25 NOTE — Patient Instructions (Signed)
 Medication Instructions:  No medication changes were made at this visit. Continue current regimen.   *If you need a refill on your cardiac medications before your next appointment, please call your pharmacy*  Lab Work: None ordered today. If you have labs (blood work) drawn today and your tests are completely normal, you will receive your results only by: MyChart Message (if you have MyChart) OR A paper copy in the mail If you have any lab test that is abnormal or we need to change your treatment, we will call you to review the results.  Testing/Procedures: Your physician has requested that you have an echocardiogram same day as follow-up with Dr. Wonda in June. Echocardiography is a painless test that uses sound waves to create images of your heart. It provides your doctor with information about the size and shape of your heart and how well your hearts chambers and valves are working. This procedure takes approximately one hour. There are no restrictions for this procedure. Please do NOT wear cologne, perfume, aftershave, or lotions (deodorant is allowed). Please arrive 15 minutes prior to your appointment time.  Please note: We ask at that you not bring children with you during ultrasound (echo/ vascular) testing. Due to room size and safety concerns, children are not allowed in the ultrasound rooms during exams. Our front office staff cannot provide observation of children in our lobby area while testing is being conducted. An adult accompanying a patient to their appointment will only be allowed in the ultrasound room at the discretion of the ultrasound technician under special circumstances. We apologize for any inconvenience.   Follow-Up: At [facility], you and your health needs are our priority.  As part of our continuing mission to provide you with exceptional heart care, our providers are all part of one team.  This team includes your primary Cardiologist (physician) and  Advanced Practice Providers or APPs (Physician Assistants and Nurse Practitioners) who all work together to provide you with the care you need, when you need it.  Your next appointment:   June 2026 (same day as echocardiogram)  Provider:   Ozell Wonda, MD

## 2024-06-25 NOTE — Progress Notes (Signed)
 " Cardiology Office Note:    Date:  06/25/2024   ID:  Eric [name], DOB February 08, 1930, MRN [id number]  PCP:  Rolinda Millman, MD    HeartCare Providers Cardiologist:  Ozell Fell, MD Structural Heart:  Ozell Fell, MD    Referring MD: Rolinda Millman, MD   Chief Complaint  Patient presents with   Fatigue    History of Present Illness:    Eric [name] is a 89 y.o. male with a hx of extensive cardiac disease.  In 2009 he underwent bioprosthetic aortic valve replacement, mitral valve repair, and single-vessel bypass surgery with a saphenous vein graft obtuse marginal.  He later developed bioprosthetic aortic valve failure and was treated with valve in valve TAVR in 2020.  He then developed severe recurrent mitral regurgitation as well as paravalvular regurgitation and underwent transcatheter mitral valve replacement with [place] and closure of a paravalvular leak on December 19, 2023 at [facility] in [place], [place] .  Unfortunately, he later developed jaundice and was noted to have elevated bilirubin and LFTs.  An MRCP demonstrated marked bile duct dilatation and a soft tissue mass in the patient underwent biliary stenting.  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  He was found to have recurrent paravalvular leak posterior and lateral to the vascular plug and mitral valve.  After multidisciplinary discussion with oncology and gastroenterology (Dr. Cloretta and Dr. Rosalie), I elected to treat him conservatively.  The patient has clearly expressed wishes to not undergo any more invasive procedures.  He is here with his wife today.  States that he is doing okay.  He complains of generalized fatigue but no shortness of breath or chest pain.  No orthopnea or PND.  I reviewed his most recent notes from oncology and he is noted to have persistent  elevation of bilirubin associated with jaundiced appearance.   Current Medications: Active Medications[1]   Allergies:   Patient has no known allergies.   ROS:   Please see the history of present illness.    All other systems reviewed and are negative.  EKGs/Labs/Other Studies Reviewed:    The following studies were reviewed today: Cardiac Studies & Procedures   ______________________________________________________________________________________________ CARDIAC CATHETERIZATION  CARDIAC CATHETERIZATION 10/04/2023  Conclusion 1.  Single-vessel coronary artery disease with moderate 75% eccentric stenosis of the proximal to mid circumflex 2.  Continued patency of the SVG to OM1 3.  Widely patent left main, LAD, and RCA with no significant stenoses 4.  Right heart catheterization data: RA mean 6 mmHg RV pressure 47 over 5 mmHg PA pressure 51/16 with a mean of 32 mmHg Pulmonary wedge A-wave 13, V wave 41, mean 22 mmHg Cardiac output 5.8 L/min with cardiac index 2.8 L/min/m  Discussion: Patent with stable coronary anatomy, patent native vessels with moderately severe circumflex stenosis and a patent SVG to OM.  Hemodynamics are pertinent for a large V wave consistent with the patient's severe mitral regurgitation as well as associated moderate pulmonary hypertension.  Transpulmonary gradient is only 10 mmHg with a PVR of 1.7 Wood units.  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  Plan to proceed with CT angiography to continue the patient's evaluation for transcatheter mitral valve and ring replacement.  Findings Coronary Findings Diagnostic  Dominance: Right  Left Main The left main is patent and divides into the LAD and left circumflex.  The  patient has very mild distal plaquing. Mid LM to Dist LM lesion is 25% stenosed. The lesion is mildly calcified.  Left Anterior Descending Vessel is large. The vessel exhibits  minimal luminal irregularities. The LAD courses to the LV apex.  The LAD has mild diffuse plaque but no significant stenosis throughout its distribution.  The first diagonal is large in caliber.  Left Circumflex Prox Cx lesion is 75% stenosed.  Second Obtuse Marginal Branch The circumflex has an eccentric 75% proximal vessel stenosis.  The vein graft to the first OM is widely patent.  Right Coronary Artery There is mild diffuse disease throughout the vessel. The RCA is patent throughout.  The PDA and PLA branches are patent.  There is mild diffuse nonobstructive plaquing in the RCA.  No high-grade stenoses. Mid RCA lesion is 40% stenosed. The lesion is moderately calcified.  Saphenous Graft To 1st Mrg SVG and is normal in caliber.  The graft exhibits no disease. The saphenous vein graft to first OM is widely patent throughout.  There are no significant stenoses.  The graft fills the circumflex in retrograde fashion.  Intervention  No interventions have been documented.   CARDIAC CATHETERIZATION  CARDIAC CATHETERIZATION 10/29/2018  Conclusion 1.  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  Single-vessel coronary artery disease with severe stenosis of the proximal left circumflex and continued patency of the saphenous vein graft to first OM 3.  Patent left main, LAD, and RCA with mild diffuse nonobstructive disease 4.  Normal right heart pressures and preserved cardiac output  Recommend: Continued multidisciplinary team evaluation with CTA studies and formal surgical consultation with Dr. Lucas in anticipation of possible valve-in-valve TAVR  Findings Coronary Findings Diagnostic  Dominance: Right  Left Main Mid LM to Dist LM lesion is 25% stenosed. The lesion is mildly calcified.  Left Anterior Descending Vessel is large. The vessel  exhibits minimal luminal irregularities. The LAD is a large vessel that courses to the LV apex.  The vessel has minor irregularities but no significant stenoses.  The first diagonal is large in caliber.  Left Circumflex Prox Cx lesion is 75% stenosed.  Right Coronary Artery There is mild diffuse disease throughout the vessel. Mid RCA lesion is 40% stenosed. The lesion is moderately calcified.  Saphenous Graft To 1st Mrg SVG graft was visualized by angiography and is normal in caliber. The graft exhibits no disease. The saphenous vein graft to first OM is widely patent throughout.  There are no significant stenoses.  The graft fills the circumflex and retrograde fashion.  Intervention  No interventions have been documented.   STRESS TESTS  MYOCARDIAL PERFUSION IMAGING 10/26/2022  Interpretation Summary   The study is normal. Findings are consistent with no ischemia and no infarction. The study is high risk.   No ST deviation was noted.   LV perfusion is normal. There is no evidence of ischemia. There is no evidence of infarction.   Left ventricular function is normal. Nuclear stress EF: 57 %. The left ventricular ejection fraction is normal (55-65%). End diastolic cavity size is normal. End systolic cavity size is normal.   Prior study available for comparison from 11/02/2010.   ECHOCARDIOGRAM  ECHOCARDIOGRAM COMPLETE 01/30/2024  Narrative ECHOCARDIOGRAM REPORT    Patient Name:   Eric [name] Date of Exam: 01/30/2024 Medical Rec #:  [id number]       Height:  72.0 in Accession #:    [id number]      Weight:       174.0 lb Date of Birth:  November 09, 1929       BSA:          2.008 m Patient Age:    89 years        BP:           104/68 mmHg Patient Gender: M               HR:           74 bpm. Exam Location:  [address]  Procedure: 2D Echo, Cardiac Doppler and Color Doppler (Both Spectral and Color Flow Doppler were utilized during procedure).  Indications:    Z95.2 S/p  TMVR  History:        Patient has prior history of Echocardiogram examinations, most recent 09/02/2023. S/p TMVR (26mm Sapien 12/19/23), Dilated aortic root; S/p TAVR (23mm Sapien).  Mitral Valve: 26 mm bioprosthetic valve valve is present in the mitral position. Procedure Date: 12/19/2023.  Sonographer:    Elsie Bohr RDCS Referring Phys: [id number] KATHRYN R THOMPSON  IMPRESSIONS   1. 26 mm S3 Edwards Sapien Vaive in [place]. Emax 2.4 m/s, MG 8.7 mmHG @ 57 bpm, EOA 1.82 cm2. Normal prosthesis. The mitral valve has been repaired/replaced. No evidence of mitral valve regurgitation. There is a 26 mm bioprosthetic valve present in the mitral position. Procedure Date: 12/19/2023. Echo findings are consistent with normal structure and function of the mitral valve prosthesis. 2. Left ventricular ejection fraction, by estimation, is 65 to 70%. The left ventricle has normal function. The left ventricle has no regional wall motion abnormalities. There is mild asymmetric left ventricular hypertrophy of the basal-septal segment. Left ventricular diastolic function could not be evaluated. 3. Right ventricular systolic function is mildly reduced. The right ventricular size is moderately enlarged. There is normal pulmonary artery systolic pressure. The estimated right ventricular systolic pressure is 34.6 mmHg. 4. Left atrial size was severely dilated. 5. Right atrial size was moderately dilated. 6. Tricuspid valve regurgitation is moderate. 7. 23 mm S3 Valve in Valve TAVR. The aortic valve has been repaired/replaced. Aortic valve regurgitation is trivial. Procedure Date: 11/25/2018. Echo findings are consistent with normal structure and function of the aortic valve prosthesis. Aortic valve area, by VTI measures 2.43 cm. Aortic valve mean gradient measures 10.4 mmHg. Aortic valve Vmax measures 2.39 m/s. 8. The inferior vena cava is normal in size with greater than 50% respiratory variability, suggesting right  atrial pressure of 3 mmHg.  Comparison(s): Changes from prior study are noted. S/p TMVR.  FINDINGS Left Ventricle: Left ventricular ejection fraction, by estimation, is 65 to 70%. The left ventricle has normal function. The left ventricle has no regional wall motion abnormalities. The left ventricular internal cavity size was normal in size. There is mild asymmetric left ventricular hypertrophy of the basal-septal segment. Abnormal (paradoxical) septal motion consistent with post-operative status. Left ventricular diastolic function could not be evaluated due to mitral valve replacement. Left ventricular diastolic function could not be evaluated.  Right Ventricle: The right ventricular size is moderately enlarged. No increase in right ventricular wall thickness. Right ventricular systolic function is mildly reduced. There is normal pulmonary artery systolic pressure. The tricuspid regurgitant velocity is 2.81 m/s, and with an assumed right atrial pressure of 3 mmHg, the estimated right ventricular systolic pressure is 34.6 mmHg.  Left Atrium: Left atrial size was severely dilated.  Right Atrium: Right  atrial size was moderately dilated.  Pericardium: There is no evidence of pericardial effusion.  Mitral Valve: 26 mm S3 Edwards Sapien Vaive in [place]. Emax 2.4 m/s, MG 8.7 mmHG @ 57 bpm, EOA 1.82 cm2. Normal prosthesis. The mitral valve has been repaired/replaced. No evidence of mitral valve regurgitation. There is a 26 mm bioprosthetic valve present in the mitral position. Procedure Date: 12/19/2023. Echo findings are consistent with normal structure and function of the mitral valve prosthesis. MV peak gradient, 22.7 mmHg. The mean mitral valve gradient is 8.7 mmHg with average heart rate of 57 bpm.  Tricuspid Valve: The tricuspid valve is grossly normal. Tricuspid valve regurgitation is moderate . No evidence of tricuspid stenosis.  Aortic Valve: 23 mm S3 Valve in Valve TAVR. The aortic valve has  been repaired/replaced. Aortic valve regurgitation is trivial. Aortic valve mean gradient measures 10.4 mmHg. Aortic valve peak gradient measures 22.8 mmHg. Aortic valve area, by VTI measures 2.43 cm. There is a 23 mm Sapien prosthetic, stented (TAVR) valve present in the aortic position. Echo findings are consistent with normal structure and function of the aortic valve prosthesis.  Pulmonic Valve: The pulmonic valve was grossly normal. Pulmonic valve regurgitation is trivial. No evidence of pulmonic stenosis.  Aorta: The aortic root and ascending aorta are structurally normal, with no evidence of dilitation.  Venous: The inferior vena cava is normal in size with greater than 50% respiratory variability, suggesting right atrial pressure of 3 mmHg.  IAS/Shunts: The atrial septum is grossly normal.   LEFT VENTRICLE PLAX 2D LVIDd:         4.40 cm   Diastology LVIDs:         2.90 cm   LV e' medial:    5.30 cm/s LV PW:         1.10 cm   LV E/e' medial:  41.2 LV IVS:        1.30 cm   LV e' lateral:   9.90 cm/s LVOT diam:     2.21 cm   LV E/e' lateral: 22.1 LV SV:         111 LV SV Index:   55 LVOT Area:     3.84 cm   RIGHT VENTRICLE            IVC RVSP:           34.6 mmHg  IVC diam: 1.50 cm  LEFT ATRIUM              Index        RIGHT ATRIUM           Index LA diam:        4.70 cm  2.34 cm/m   RA Pressure: 3.00 mmHg LA Vol (A2C):   70.8 ml  35.26 ml/m  RA Area:     22.70 cm LA Vol (A4C):   100.0 ml 49.80 ml/m  RA Volume:   69.60 ml  34.66 ml/m LA Biplane Vol: 86.9 ml  43.27 ml/m AORTIC VALVE AV Area (Vmax):    2.12 cm AV Area (Vmean):   2.36 cm AV Area (VTI):     2.43 cm AV Vmax:           238.80 cm/s AV Vmean:          145.800 cm/s AV VTI:            0.458 m AV Peak Grad:      22.8 mmHg AV Mean Grad:  10.4 mmHg LVOT Vmax:         132.20 cm/s LVOT Vmean:        89.640 cm/s LVOT VTI:          0.290 m LVOT/AV VTI ratio: 0.63  AORTA Ao Asc diam: 3.90  cm  MITRAL VALVE                TRICUSPID VALVE MV Area (PHT): 1.61 cm     TR Peak grad:   31.6 mmHg MV Area VTI:   1.82 cm     TR Vmax:        281.00 cm/s MV Peak grad:  22.7 mmHg    Estimated RAP:  3.00 mmHg MV Mean grad:  8.7 mmHg     RVSP:           34.6 mmHg MV Vmax:       2.38 m/s MV Vmean:      131.0 cm/s   SHUNTS MV VTI:        0.61 m       Systemic VTI:  0.29 m MV Decel Time: 472 msec     Systemic Diam: 2.21 cm MV E velocity: 218.67 cm/s  Darryle Decent MD Electronically signed by Darryle Decent MD Signature Date/Time: 01/30/2024/6:09:07 PM    Final   TEE  ECHO TEE 03/02/2024  Narrative TRANSESOPHOGEAL ECHO REPORT    Patient Name:   Eric [name] Date of Exam: 03/02/2024 Medical Rec #:  [id number]       Height:       72.0 in Accession #:    [id number]      Weight:       166.9 lb Date of Birth:  03-24-1930       BSA:          1.973 m Patient Age:    89 years        BP:           138/96 mmHg Patient Gender: M               HR:           80 bpm. Exam Location:  Inpatient  Procedure: Transesophageal Echo, 3D Echo, Cardiac Doppler and Color Doppler (Both Spectral and Color Flow Doppler were utilized during procedure).  Indications:     I34.0 Nonrheumatic mitral (valve) insufficiency  History:         Patient has prior history of Echocardiogram examinations, most recent 01/30/2024. Arrythmias:Atrial Fibrillation; Risk Factors:Dyslipidemia. Aortic Valve: 23 mm Edwards Sapien prosthetic, stented (TAVR) valve is present in the aortic position. Procedure Date: 11/25/2018. Mitral Valve: 26 mm Edwards Sapien bioprosthetic valve valve is present in the mitral position. Procedure Date: 12/19/23.  Sonographer:     Damien Senior RDCS Referring Phys:  [id number] LAMARR JONELLE HUMMER Diagnosing Phys: Jerel Balding MD  PROCEDURE: After discussion of the risks and benefits of a TEE, an informed consent was obtained from the patient. The transesophogeal probe was passed without  difficulty through the esophogus of the patient. Imaged were obtained with the patient in a left lateral decubitus position. Sedation performed by different physician. The patient was monitored while under deep sedation. The patient developed no complications during the procedure.  IMPRESSIONS   1. Left ventricular ejection fraction, by estimation, is 60 to 65%. The left ventricle has normal function. 2. Right ventricular systolic function is normal. The right ventricular size is severely enlarged. 3. LAA appears surgically oversewn but is still patent (  slit like communication is seen towards the anterior mitral annulus) with no evidence of thrombus. Left atrial size was severely dilated. No left atrial/left atrial appendage thrombus was detected. 4. Right atrial size was severely dilated. 5. Valve-in-ring TMVR with vascular plug at the anterolateral aspect (10 o'clock), with a residual defect lateral and posterior to the plug. The defect is roughly 5-6 mm in diameter at its broadest point. Using the PISA method, the effective regurgitant orifice area is 0.18 cm sq, regurgitant volume 20 ml, regurgitant fraction 17%. The mitral valve has been repaired/replaced. Mild to moderate mitral valve regurgitation. Moderate mitral stenosis. The mean mitral valve gradient is 11.0 mmHg with average heart rate of 75 bpm. There is a 26 mm Edwards Sapien bioprosthetic valve present in the mitral position. Procedure Date: 12/19/23. Echo findings are consistent with perivalvular leak the mitral prosthesis. 6. The tricuspid valve is myxomatous. 7. Valve-in-valve TAVR. The aortic valve has been repaired/replaced. Aortic valve regurgitation is not visualized. There is a 23 mm Edwards Sapien prosthetic (TAVR) valve present in the aortic position. Procedure Date: 11/25/2018. Echo findings are consistent with normal structure and function of the aortic valve prosthesis. Aortic valve mean gradient measures 14.0 mmHg. Aortic  valve Vmax measures 2.40 m/s. 8. Evidence of atrial level shunting detected by color flow Doppler. 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 9. 3D performed of the mitral valve and demonstrates periannular mitral regurgitation.  FINDINGS Left Ventricle: Left ventricular ejection fraction, by estimation, is 60 to 65%. The left ventricle has normal function. The left ventricular internal cavity size was normal in size. There is no left ventricular hypertrophy.  Right Ventricle: The right ventricular size is severely enlarged. No increase in right ventricular wall thickness. Right ventricular systolic function is normal.  Left Atrium: LAA appears surgically oversewn but is still patent (slit like communication is seen towards the anterior mitral annulus) with no evidence of thrombus. Left atrial size was severely dilated. No left atrial/left atrial appendage thrombus was detected.  Right Atrium: Right atrial size was severely dilated.  Pericardium: There is no evidence of pericardial effusion.  Mitral Valve: Valve-in-ring TMVR with vascular plug at the anterolateral aspect (10 o'clock), with a residual defect lateral and posterior to the plug. The defect is roughly 5-6 mm in diameter at its broadest point. Using the PISA method, the effective regurgitant orifice area is 0.18 cm sq, regurgitant volume 20 ml, regurgitant fraction 17%. The mitral valve has been repaired/replaced. Mild to moderate mitral valve regurgitation. There is a 26 mm Edwards Sapien bioprosthetic valve present in the mitral position. Procedure Date: 12/19/23. Echo findings are consistent with perivalvular leak the mitral prosthesis. Moderate mitral valve stenosis. MV peak gradient, 20.6 mmHg. The mean mitral valve gradient is 11.0 mmHg with average heart rate of 75 bpm.  Tricuspid Valve:  The tricuspid valve is myxomatous. Tricuspid valve regurgitation is mild. There is mild prolapse of the tricuspid.  Aortic Valve: Valve-in-valve TAVR. The aortic valve has been repaired/replaced. Aortic valve regurgitation is not visualized. Aortic valve mean gradient measures 14.0 mmHg. Aortic valve peak gradient measures 23.0 mmHg. Aortic valve area, by VTI measures 1.65 cm. 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 Procedure Date: 11/25/2018. Echo findings are consistent with normal structure and function of the aortic valve prosthesis.  Pulmonic Valve: The pulmonic valve  was grossly normal. Pulmonic valve regurgitation is trivial.  Aorta: The aortic root and ascending aorta are structurally normal, with no evidence of dilitation.  IAS/Shunts: Evidence of atrial level shunting detected by color flow Doppler. 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  Additional Comments: Spectral Doppler performed.  LEFT VENTRICLE PLAX 2D LVOT diam:     2.00 cm LV SV:         95 LV SV Index:   48 LVOT Area:     3.14 cm   AORTIC VALVE AV Area (Vmax):    1.68 cm AV Area (Vmean):   1.79 cm AV Area (VTI):     1.65 cm AV Vmax:           240.00 cm/s AV Vmean:          171.000 cm/s AV VTI:            0.572 m AV Peak Grad:      23.0 mmHg AV Mean Grad:      14.0 mmHg LVOT Vmax:         128.00 cm/s LVOT Vmean:        97.700 cm/s LVOT VTI:          0.301 m LVOT/AV VTI ratio: 0.53  AORTA Ao Asc diam: 3.58 cm  MITRAL VALVE               TRICUSPID VALVE MV Area (PHT): 1.88 cm    TR Peak grad:   22.5 mmHg MV Peak grad:  20.6 mmHg   TR Vmax:        237.00 cm/s MV Mean grad:  11.0 mmHg MV Vmax:       2.27 m/s    SHUNTS MV Vmean:      155.0 cm/s  Systemic VTI:  0.30 m MV Decel Time: 402 msec    Systemic  Diam: 2.00 cm  Jerel Croitoru MD Electronically signed by Jerel Balding MD Signature Date/Time: 03/02/2024/6:17:52 PM    Final    CT SCANS  CT CORONARY MORPH W/CTA COR W/SCORE 10/22/2023  Addendum 10/23/2023 10:14 PM ADDENDUM REPORT: 10/23/2023 22:12  EXAM: OVER-READ INTERPRETATION  CT CHEST  The following report is an over-read performed by radiologist Dr. Fonda Mom [facility] on 10/23/2023. This over-read does not include interpretation of cardiac or coronary anatomy or pathology. The coronary CTA interpretation by the cardiologist is attached.  COMPARISON:  06/29/2023.  FINDINGS: Cardiovascular:  See findings discussed in the body of the report.  Mediastinum/Nodes: No suspicious adenopathy identified. Imaged mediastinal structures are unremarkable.  Lungs/Pleura: Pulmonary interstitial changes consistent with scarring at the bases. Small right-sided pleural effusion.  Upper Abdomen: No acute abnormality.  Musculoskeletal: No chest wall abnormality. No acute osseous findings.  IMPRESSION: Small right-sided pleural effusion.   Electronically Signed By: Fonda Field M.D. On: 10/23/2023 22:12  Narrative CLINICAL DATA:  Prior AVR/TAVR with Mitral Valve repair  Pre Mitral Valve in Ring Assessment  Severe MR  MEDICATIONS: No meds given  EXAM: Gated cardiac CTA  TECHNIQUE: The patient was scanned on a GE apex scanner. 1 beat acquisition triggered in the descending thoracic aorta at 110 HU's. A non contrast, gated CT scan was obtained first with axial slices of 2.5 mm through the heart for valve scoring. A 120 kV retrospective, gated, contrast scan done with gantry rotation speed of 230 msec  and collimation 0.63 mm. A delayed scan was obtained to exclude LAA thrombus. The 3D data set was reconstructed in 5% intervals of the R-R cycle. Best systolic phase was motion corrected Images were analyzed on a dedicated workstation using  MPR, MIP and VRT modes The patient received 100 cc of contrast  FINDINGS: Severe bi atrial enlargement. Large wind sock appendage has not been surgically occluded. Cannot r/o thrombus in mid/apical appendage vs mixing artifact No delayed imaging performed. However non contrast imaging for calcium  scoring showed no apparent thrombus in the LAA. There is no ASD/PFO. The valve in bioprosthetic TAVR is well positioned with no PVL and no HALT/HAM on leaflets. The native MV leaflets appear thickened with some posterior leaflet prolapse. There is an area of severe bulky calcification below the posterior annulus/chords measuring 8.8 mm x 7.3 mm. The is an area of PVL around the MV ring laterally measuring 6.2 mm x 4.9 mm with average diameter 5.5 mm, area 0.24 cm2 and perimeter of 17.6 mm.  The patient has a 28 mm Sorin mitral annuloplasty ring implanted 2009 2D dimensions below  Diastole  TT Distance 23 mm  SL Distance 16.4 mm  IC Distance 26.3 mm  Systole  TT Distance 20.7 mm  SL Distance 16 mm  IC Distance 25.5 mm  The mitral plane aortic angle is 120 degrees  The aortic root anlge is 47 degrees  Using the best diastolic phase and a 26 mm valve model the Neo LVOT measures 436 mm2  Using the best systolic phase and a 26 mm valve model the Neo LVOT measures 427 mm2  Prior AVR with bioprosthetic AVR 23 mm Mitroflow and subsequent TAVR using 23 mm Sapien 3 valve No HALT/HAM noted  No PVL noted.  Mild ascending thoracic aorta dilatation 3.8 cm  There is a patent SVG to the OM branch.  IMPRESSION: 1. 28 mm Sorin annuloplasty ring with native posterior leaflet prolapse. PVL noted anterior and laterally with area of 0.24 cm2  2. Neo LVOT 436 mm2 in diastole and 427 mm2 in systole modeled with a 26 mm Sapien valve  3. Normal appearing valve in valve AV with 23 mm Sapien 3 valve No PVL No HALT/HAM  4.  Patent SVG to OM  5. Mitral aortic angle of 120 degrees. With  aortic root angle 47 degrees  6. Windsock appendage with mixing artifact on contrast images. No apparent thrombus on non contrast images  7.  Severe bi atrial enlargement  8.  No ASD/PFO  9.  Mild dilatation of the ascending thoracic aorta 3.8 cm  10. Area of dense calcification sub-annular to Sorin ring involving posterior chords measuring 8.8 mm x 7.3 mm  Maude Emmer  Electronically Signed: By: Maude Emmer M.D. On: 10/22/2023 12:48   CT SCANS  CT CORONARY MORPH W/CTA COR W/SCORE 11/03/2018  Addendum 11/03/2018  5:32 PM ADDENDUM REPORT: 11/03/2018 17:29  EXAM: OVER-READ INTERPRETATION  CT CHEST  The following report is an over-read performed by radiologist Dr. Selinda Saas [facility] on 11/03/2018. This over-read does not include interpretation of cardiac or coronary anatomy or pathology. The cardiac CTA interpretation by the cardiologist is attached.  COMPARISON:  11/11/2006 chest CT angiogram.  FINDINGS: Please see the separate concurrent chest CT angiogram report for details.  IMPRESSION: Please see the separate concurrent chest CT angiogram report for details.   Electronically Signed By: Selinda DELENA Blue M.D. On: 11/03/2018 17:29  Narrative CLINICAL DATA:  AVR Pre Valve in  Valve TAVR  EXAM: Cardiac TAVR CT  TECHNIQUE: The patient was scanned on a Siemens Force 192 slice scanner. A 120 kV retrospective scan was triggered in the ascending thoracic aorta at 140 HU's. Gantry rotation speed was 250 msecs and collimation was .6 mm. No beta blockade or nitro were given. The 3D data set was reconstructed in 5% intervals of the R-R cycle. Systolic and diastolic phases were analyzed on a dedicated work station using MPR, MIP and VRT modes. The patient received 80 cc of contrast.  FINDINGS: Aortic Valve: There is a 23 mm Mitroflow stented pericardial bioprosthetic in place. Sewing ring appears intact with no areas of dehiscence The leaflets  are thickened and there appears to be prolapse of the right coronary cusp.  Aorta: Moderate calcific atherosclerosis No aneurysm. Normal arch vessels  Sino-tubular Junction: 29 mm  Ascending Thoracic Aorta: 36 mm  Aortic Arch: 29 mm  Descending Thoracic Aorta: 24 mm  Sinus of Valsalva Measurements:  Non-coronary: 29.4 mm  Right - coronary: 28 mm  Left -   coronary: 30.6 mm  Coronary Artery Height above Annulus:  Left Main: 10.6 mm above annulus  Right Coronary: 10.9 mm above annulus  Valve ID: 19.2 mm  Area inside sewing ring 334 mm2  There is a patent SVG to the OM branch. The patient has had a mitral valve repair with annuloplasty ring. The LAA was supposed to have been ligated but appears patent with no obvious thrombus  IMPRESSION: 1. 23 mm Mitroflow stented pericardial tissue valve with thickened leaflets and possible prolapse of right cusp.  2. On SA basal view LM is 7.3 mm from valve leaflet border and RCA is 7.4 mm away However there is concern for the coronary heights Even a 20 mm Sapein 3 is 14 mm in height  The RCA appears to rest behind the right leaflet with STJ calcium  and may be at higher risk of occlusion  3.  Normal aortic root 3.6 cm  4.  Post mitral valve repair with annuloplasty ring  5.  LAA is patent Surgical note indicates ligation  6.  Patent SVG to OM  Maude Emmer  Electronically Signed: By: Maude Emmer M.D. On: 11/03/2018 15:16     ______________________________________________________________________________________________      EKG:        Recent Labs: 06/29/2023: B Natriuretic Peptide 225.9 08/05/2023: NT-Pro BNP 966 01/13/2024: Magnesium  1.8 06/18/2024: ALT 11; BUN 28; Creatinine 1.05; Hemoglobin 10.6; Platelet Count 115; Potassium 4.4; Sodium 139  Recent Lipid Panel    Component Value Date/Time   CHOL 127 11/25/2020 0258   CHOL 130 04/27/2019 0840   TRIG 57 11/25/2020 0258   HDL 50 11/25/2020 0258   HDL 63  04/27/2019 0840   CHOLHDL 2.5 11/25/2020 0258   VLDL 11 11/25/2020 0258   LDLCALC 66 11/25/2020 0258   LDLCALC 53 04/27/2019 0840     Risk Assessment/Calculations:    CHA2DS2-VASc Score = 7   This indicates a 11.2% annual risk of stroke. The patient's score is based upon: CHF History: 1 HTN History: 1 Diabetes History: 0 Stroke History: 2 Vascular Disease History: 1 Age Score: 2 Gender Score: 0               Physical Exam:    VS:  BP 104/64 (BP Location: Right Arm)   Pulse (!) 52   Ht 6' (1.829 m)   Wt 159 lb 3.2 oz (72.2 kg)   SpO2 99%   BMI 21.59  kg/m     Wt Readings from Last 3 Encounters:  06/25/24 159 lb 3.2 oz (72.2 kg)  06/18/24 158 lb 12.8 oz (72 kg)  04/21/24 165 lb (74.8 kg)     GEN: Elderly male in no acute distress HEENT: Normal NECK: No JVD; No carotid bruits LYMPHATICS: No lymphadenopathy CARDIAC: RRR, 2/6 holosystolic murmur at the apex RESPIRATORY:  Clear to auscultation without rales, wheezing or rhonchi  ABDOMEN: Soft, non-tender, non-distended MUSCULOSKELETAL:  No edema; No deformity  SKIN: Warm and dry, jaundiced NEUROLOGIC:  Alert and oriented x 3 PSYCHIATRIC:  Normal affect   Assessment & Plan Longstanding persistent atrial fibrillation (HCC) Appears to be maintaining sinus rhythm.  Anticoagulated with apixaban . Coronary artery disease involving native coronary artery of native heart with other form of angina pectoris No anginal symptoms.  Patient off of antiplatelet therapy in the setting of chronic oral anticoagulation. Paravalvular leak (prosthetic valve), subsequent encounter The patient has paravalvular leak around the lateral aspect of his closure device.  He does not have a severe leak, but there is suspicion that this is causing hemolysis.  The patient's blood counts have been stable.  Favor ongoing observation and no further interventions. Chronic diastolic congestive heart failure (HCC) Managed medically with furosemide   and spironolactone .  No evidence of volume overload on exam. S/P transcatheter mitral valve replacement (TMVR) The patient had complex intervention ultimately treated with transcatheter mitral valve replacement in July 2025.  I recommended a repeat echocardiogram and follow-up visit in 6 months for surveillance.  We had a lengthy discussion today about his overall prognosis.  While he does not carry a firm diagnosis of malignancy, he has persistent hyperbilirubinemia, significant cardiac problems, and declining functional capacity.  They are going to discuss palliative care/hospice with their primary care physician.  I provided support for their decision and talk to them about my personal very positive experiences with hospice.            Medication Adjustments/Labs and Tests Ordered: Current medicines are reviewed at length with the patient today.  Concerns regarding medicines are outlined above.  Orders Placed This Encounter  Procedures   ECHOCARDIOGRAM COMPLETE   No orders of the defined types were placed in this encounter.   Patient Instructions  Medication Instructions:  No medication changes were made at this visit. Continue current regimen.   *If you need a refill on your cardiac medications before your next appointment, please call your pharmacy*  Lab Work: None ordered today. If you have labs (blood work) drawn today and your tests are completely normal, you will receive your results only by: MyChart Message (if you have MyChart) OR A paper copy in the mail If you have any lab test that is abnormal or we need to change your treatment, we will call you to review the results.  Testing/Procedures: Your physician has requested that you have an echocardiogram same day as follow-up with Dr. Wonda in June. Echocardiography is a painless test that uses sound waves to create images of your heart. It provides your doctor with information about the size and shape of your heart and  how well your hearts chambers and valves are working. This procedure takes approximately one hour. There are no restrictions for this procedure. Please do NOT wear cologne, perfume, aftershave, or lotions (deodorant is allowed). Please arrive 15 minutes prior to your appointment time.  Please note: We ask at that you not bring children with you during ultrasound (echo/ vascular) testing. Due to room  size and safety concerns, children are not allowed in the ultrasound rooms during exams. Our front office staff cannot provide observation of children in our lobby area while testing is being conducted. An adult accompanying a patient to their appointment will only be allowed in the ultrasound room at the discretion of the ultrasound technician under special circumstances. We apologize for any inconvenience.   Follow-Up: At [name], you and your health needs are our priority.  As part of our continuing mission to provide you with exceptional heart care, our providers are all part of one team.  This team includes your primary Cardiologist (physician) and Advanced Practice Providers or APPs (Physician Assistants and Nurse Practitioners) who all work together to provide you with the care you need, when you need it.  Your next appointment:   June 2026 (same day as echocardiogram)  Provider:   Ozell Fell, MD     Signed, Ozell Fell, MD  06/25/2024 1:40 PM    [facility]     [1]  Current Meds  Medication Sig   apixaban  (ELIQUIS ) 5 MG TABS tablet TAKE 1 TABLET BY MOUTH TWICE  DAILY   Ascorbic Acid (VITAMIN C PO) Take 500 mg by mouth in the morning. Gummies 250 mg each   atorvastatin  (LIPITOR) 20 MG tablet Take 1 tablet (20 mg total) by mouth daily.   cyanocobalamin (VITAMIN B12) 1000 MCG tablet Take 1,000 mcg by mouth daily.   diltiazem  (CARDIZEM ) 30 MG tablet TAKE 1 TABLET BY MOUTH DAILY AS  NEEDED (Patient taking differently: Take 30 mg by mouth as needed.)   Eyelid  Cleansers (OCUSOFT EYELID CLEANSING) PADS Place 1 application  into both eyes in the morning.   ezetimibe  (ZETIA ) 10 MG tablet Take 1 tablet (10 mg total) by mouth daily.   folic acid  (FOLVITE ) 1 MG tablet Take 1 tablet (1 mg total) by mouth daily.   furosemide  (LASIX ) 20 MG tablet Take 2 tablets (40 mg total) by mouth daily.   metoprolol  tartrate (LOPRESSOR ) 25 MG tablet TAKE 2 TABLETS BY MOUTH TWICE  DAILY   nitroGLYCERIN  (NITROSTAT ) 0.4 MG SL tablet Place 1 tablet (0.4 mg total) under the tongue every 5 (five) minutes as needed.   polyethylene glycol (MIRALAX  / GLYCOLAX ) 17 g packet Take 17 g by mouth in the morning.   potassium chloride  (KLOR-CON  M) 10 MEQ tablet Take 10 mEq by mouth daily. Take with Furosemide    sennosides-docusate sodium (SENOKOT-S) 8.6-50 MG tablet Take 1 tablet by mouth at bedtime.   spironolactone  (ALDACTONE ) 25 MG tablet Take 0.5 tablets (12.5 mg total) by mouth in the morning.   traZODone (DESYREL) 50 MG tablet Take 75 mg by mouth at bedtime.   "

## 2024-06-26 ENCOUNTER — Ambulatory Visit: Admitting: Cardiovascular Disease

## 2024-09-15 ENCOUNTER — Inpatient Hospital Stay

## 2024-09-15 ENCOUNTER — Inpatient Hospital Stay: Admitting: Oncology

## 2024-10-19 ENCOUNTER — Ambulatory Visit (HOSPITAL_COMMUNITY)

## 2024-10-21 ENCOUNTER — Ambulatory Visit: Admitting: Cardiovascular Disease

## 2024-10-21 ENCOUNTER — Ambulatory Visit (HOSPITAL_COMMUNITY)

## 2024-12-21 ENCOUNTER — Other Ambulatory Visit (HOSPITAL_COMMUNITY)

## 2024-12-21 ENCOUNTER — Ambulatory Visit: Admitting: Physician Assistant
